# Patient Record
Sex: Female | Born: 1965 | Race: White | Hispanic: No | Marital: Married | Smoking: Current every day smoker
Health system: Southern US, Community
[De-identification: ages and names within clinical notes are randomized; demographics above are authoritative.]

## PROBLEM LIST (undated history)

## (undated) DIAGNOSIS — K219 Gastro-esophageal reflux disease without esophagitis: Secondary | ICD-10-CM

## (undated) DIAGNOSIS — Z720 Tobacco use: Secondary | ICD-10-CM

## (undated) DIAGNOSIS — R51 Headache: Secondary | ICD-10-CM

## (undated) DIAGNOSIS — F419 Anxiety disorder, unspecified: Secondary | ICD-10-CM

## (undated) DIAGNOSIS — D539 Nutritional anemia, unspecified: Secondary | ICD-10-CM

## (undated) DIAGNOSIS — G8929 Other chronic pain: Secondary | ICD-10-CM

## (undated) DIAGNOSIS — R519 Headache, unspecified: Secondary | ICD-10-CM

## (undated) DIAGNOSIS — E119 Type 2 diabetes mellitus without complications: Secondary | ICD-10-CM

## (undated) DIAGNOSIS — F329 Major depressive disorder, single episode, unspecified: Secondary | ICD-10-CM

## (undated) DIAGNOSIS — J449 Chronic obstructive pulmonary disease, unspecified: Secondary | ICD-10-CM

## (undated) DIAGNOSIS — Z9289 Personal history of other medical treatment: Secondary | ICD-10-CM

## (undated) DIAGNOSIS — Z973 Presence of spectacles and contact lenses: Secondary | ICD-10-CM

## (undated) DIAGNOSIS — Z9889 Other specified postprocedural states: Secondary | ICD-10-CM

## (undated) DIAGNOSIS — J40 Bronchitis, not specified as acute or chronic: Secondary | ICD-10-CM

## (undated) DIAGNOSIS — R112 Nausea with vomiting, unspecified: Secondary | ICD-10-CM

## (undated) DIAGNOSIS — H269 Unspecified cataract: Secondary | ICD-10-CM

## (undated) DIAGNOSIS — K089 Disorder of teeth and supporting structures, unspecified: Secondary | ICD-10-CM

## (undated) DIAGNOSIS — F32A Depression, unspecified: Secondary | ICD-10-CM

## (undated) DIAGNOSIS — N6092 Unspecified benign mammary dysplasia of left breast: Secondary | ICD-10-CM

## (undated) HISTORY — PX: TUBAL LIGATION: SHX77

## (undated) HISTORY — DX: Anxiety disorder, unspecified: F41.9

## (undated) HISTORY — DX: Headache, unspecified: R51.9

## (undated) HISTORY — DX: Other chronic pain: G89.29

## (undated) HISTORY — DX: Unspecified cataract: H26.9

## (undated) HISTORY — DX: Tobacco use: Z72.0

## (undated) HISTORY — PX: ABDOMINAL HYSTERECTOMY: SHX81

## (undated) HISTORY — DX: Nutritional anemia, unspecified: D53.9

## (undated) HISTORY — DX: Headache: R51

---
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
## 1986-12-24 HISTORY — PX: APPENDECTOMY: SHX54

## 1993-12-24 HISTORY — PX: KNEE ARTHROSCOPY: SUR90

## 1998-05-30 ENCOUNTER — Emergency Department (HOSPITAL_COMMUNITY): Admission: EM | Admit: 1998-05-30 | Discharge: 1998-05-30 | Payer: Self-pay | Admitting: Emergency Medicine

## 1998-07-04 ENCOUNTER — Encounter: Admission: RE | Admit: 1998-07-04 | Discharge: 1998-07-04 | Payer: Self-pay | Admitting: Family Medicine

## 1998-07-15 ENCOUNTER — Encounter: Admission: RE | Admit: 1998-07-15 | Discharge: 1998-07-15 | Payer: Self-pay | Admitting: Family Medicine

## 1998-08-03 ENCOUNTER — Encounter: Admission: RE | Admit: 1998-08-03 | Discharge: 1998-08-03 | Payer: Self-pay | Admitting: Family Medicine

## 1999-04-04 ENCOUNTER — Encounter: Admission: RE | Admit: 1999-04-04 | Discharge: 1999-04-04 | Payer: Self-pay | Admitting: Family Medicine

## 1999-04-11 ENCOUNTER — Encounter: Admission: RE | Admit: 1999-04-11 | Discharge: 1999-04-11 | Payer: Self-pay | Admitting: Sports Medicine

## 2000-03-31 ENCOUNTER — Encounter: Payer: Self-pay | Admitting: Emergency Medicine

## 2000-03-31 ENCOUNTER — Emergency Department (HOSPITAL_COMMUNITY): Admission: EM | Admit: 2000-03-31 | Discharge: 2000-03-31 | Payer: Self-pay | Admitting: Emergency Medicine

## 2001-10-24 ENCOUNTER — Emergency Department (HOSPITAL_COMMUNITY): Admission: EM | Admit: 2001-10-24 | Discharge: 2001-10-24 | Payer: Self-pay | Admitting: Emergency Medicine

## 2001-10-24 ENCOUNTER — Encounter: Payer: Self-pay | Admitting: Emergency Medicine

## 2001-10-26 ENCOUNTER — Emergency Department (HOSPITAL_COMMUNITY): Admission: EM | Admit: 2001-10-26 | Discharge: 2001-10-26 | Payer: Self-pay | Admitting: Emergency Medicine

## 2001-10-27 ENCOUNTER — Inpatient Hospital Stay (HOSPITAL_COMMUNITY): Admission: EM | Admit: 2001-10-27 | Discharge: 2001-10-31 | Payer: Self-pay | Admitting: Emergency Medicine

## 2001-10-29 ENCOUNTER — Encounter: Payer: Self-pay | Admitting: Internal Medicine

## 2001-11-17 ENCOUNTER — Ambulatory Visit (HOSPITAL_COMMUNITY): Admission: RE | Admit: 2001-11-17 | Discharge: 2001-11-17 | Payer: Self-pay | Admitting: *Deleted

## 2001-11-18 ENCOUNTER — Encounter: Admission: RE | Admit: 2001-11-18 | Discharge: 2001-11-18 | Payer: Self-pay | Admitting: Internal Medicine

## 2004-03-12 ENCOUNTER — Emergency Department (HOSPITAL_COMMUNITY): Admission: EM | Admit: 2004-03-12 | Discharge: 2004-03-12 | Payer: Self-pay | Admitting: Emergency Medicine

## 2004-09-24 ENCOUNTER — Emergency Department (HOSPITAL_COMMUNITY): Admission: EM | Admit: 2004-09-24 | Discharge: 2004-09-24 | Payer: Self-pay | Admitting: Emergency Medicine

## 2004-12-12 ENCOUNTER — Emergency Department (HOSPITAL_COMMUNITY): Admission: EM | Admit: 2004-12-12 | Discharge: 2004-12-12 | Payer: Self-pay | Admitting: Family Medicine

## 2005-07-15 ENCOUNTER — Emergency Department (HOSPITAL_COMMUNITY): Admission: EM | Admit: 2005-07-15 | Discharge: 2005-07-16 | Payer: Self-pay | Admitting: Emergency Medicine

## 2007-01-19 ENCOUNTER — Emergency Department (HOSPITAL_COMMUNITY): Admission: EM | Admit: 2007-01-19 | Discharge: 2007-01-20 | Payer: Self-pay | Admitting: Emergency Medicine

## 2009-03-10 ENCOUNTER — Emergency Department (HOSPITAL_COMMUNITY): Admission: EM | Admit: 2009-03-10 | Discharge: 2009-03-10 | Payer: Self-pay | Admitting: Emergency Medicine

## 2009-05-02 ENCOUNTER — Emergency Department (HOSPITAL_COMMUNITY): Admission: EM | Admit: 2009-05-02 | Discharge: 2009-05-02 | Payer: Self-pay | Admitting: Family Medicine

## 2009-09-30 ENCOUNTER — Ambulatory Visit: Payer: Self-pay | Admitting: Family Medicine

## 2009-09-30 DIAGNOSIS — G4700 Insomnia, unspecified: Secondary | ICD-10-CM | POA: Insufficient documentation

## 2009-09-30 DIAGNOSIS — F17200 Nicotine dependence, unspecified, uncomplicated: Secondary | ICD-10-CM | POA: Insufficient documentation

## 2009-09-30 DIAGNOSIS — R5383 Other fatigue: Secondary | ICD-10-CM | POA: Insufficient documentation

## 2009-09-30 DIAGNOSIS — F4323 Adjustment disorder with mixed anxiety and depressed mood: Secondary | ICD-10-CM | POA: Insufficient documentation

## 2009-09-30 DIAGNOSIS — R5381 Other malaise: Secondary | ICD-10-CM | POA: Insufficient documentation

## 2009-09-30 DIAGNOSIS — F411 Generalized anxiety disorder: Secondary | ICD-10-CM | POA: Insufficient documentation

## 2009-09-30 DIAGNOSIS — R030 Elevated blood-pressure reading, without diagnosis of hypertension: Secondary | ICD-10-CM | POA: Insufficient documentation

## 2009-10-03 ENCOUNTER — Telehealth: Payer: Self-pay | Admitting: Family Medicine

## 2009-10-04 LAB — CONVERTED CEMR LAB
Basophils Relative: 0.5 % (ref 0.0–3.0)
Calcium: 8.9 mg/dL (ref 8.4–10.5)
Chloride: 105 meq/L (ref 96–112)
Eosinophils Relative: 4 % (ref 0.0–5.0)
Glucose, Bld: 80 mg/dL (ref 70–99)
HDL: 35.6 mg/dL — ABNORMAL LOW (ref >39.00)
LDL Cholesterol: 89 mg/dL (ref 0–99)
Lymphs Abs: 2.3 10*3/uL (ref 0.7–4.0)
MCV: 96.8 fL (ref 78.0–100.0)
Monocytes Absolute: 0.7 10*3/uL (ref 0.1–1.0)
Monocytes Relative: 10 % (ref 3.0–12.0)
Neutro Abs: 3.2 10*3/uL (ref 1.4–7.7)
Neutrophils Relative %: 50.6 % (ref 43.0–77.0)
RDW: 12.4 % (ref 11.5–14.6)
TSH: 0.68 microintl units/mL (ref 0.35–5.50)
Total CHOL/HDL Ratio: 4
VLDL: 8.8 mg/dL (ref 0.0–40.0)

## 2009-10-28 ENCOUNTER — Ambulatory Visit: Payer: Self-pay | Admitting: Family Medicine

## 2009-10-28 ENCOUNTER — Other Ambulatory Visit: Admission: RE | Admit: 2009-10-28 | Discharge: 2009-10-28 | Payer: Self-pay | Admitting: Family Medicine

## 2009-10-28 ENCOUNTER — Encounter: Payer: Self-pay | Admitting: Family Medicine

## 2009-10-28 DIAGNOSIS — R072 Precordial pain: Secondary | ICD-10-CM | POA: Insufficient documentation

## 2009-10-28 LAB — HM PAP SMEAR

## 2009-10-31 ENCOUNTER — Encounter: Admission: RE | Admit: 2009-10-31 | Discharge: 2009-10-31 | Payer: Self-pay | Admitting: Family Medicine

## 2009-10-31 ENCOUNTER — Encounter (INDEPENDENT_AMBULATORY_CARE_PROVIDER_SITE_OTHER): Payer: Self-pay | Admitting: *Deleted

## 2009-11-02 DIAGNOSIS — N63 Unspecified lump in unspecified breast: Secondary | ICD-10-CM | POA: Insufficient documentation

## 2009-11-04 ENCOUNTER — Encounter: Payer: Self-pay | Admitting: Family Medicine

## 2009-11-09 ENCOUNTER — Encounter: Admission: RE | Admit: 2009-11-09 | Discharge: 2009-11-09 | Payer: Self-pay | Admitting: Family Medicine

## 2009-11-09 LAB — HM MAMMOGRAPHY

## 2009-11-15 ENCOUNTER — Ambulatory Visit: Payer: Self-pay

## 2009-11-15 ENCOUNTER — Ambulatory Visit: Payer: Self-pay | Admitting: Cardiovascular Disease

## 2009-11-30 ENCOUNTER — Ambulatory Visit: Payer: Self-pay | Admitting: Family Medicine

## 2009-12-18 ENCOUNTER — Telehealth: Payer: Self-pay | Admitting: Family Medicine

## 2009-12-24 DIAGNOSIS — Z9289 Personal history of other medical treatment: Secondary | ICD-10-CM

## 2009-12-24 HISTORY — DX: Personal history of other medical treatment: Z92.89

## 2009-12-29 ENCOUNTER — Ambulatory Visit: Payer: Self-pay | Admitting: Family Medicine

## 2009-12-29 DIAGNOSIS — N949 Unspecified condition associated with female genital organs and menstrual cycle: Secondary | ICD-10-CM

## 2009-12-29 DIAGNOSIS — N938 Other specified abnormal uterine and vaginal bleeding: Secondary | ICD-10-CM | POA: Insufficient documentation

## 2010-01-05 ENCOUNTER — Ambulatory Visit: Payer: Self-pay | Admitting: Obstetrics & Gynecology

## 2010-01-06 ENCOUNTER — Encounter: Payer: Self-pay | Admitting: Obstetrics & Gynecology

## 2010-01-06 LAB — CONVERTED CEMR LAB
Trich, Wet Prep: NONE SEEN
Yeast Wet Prep HPF POC: NONE SEEN

## 2010-01-10 ENCOUNTER — Ambulatory Visit (HOSPITAL_COMMUNITY): Admission: RE | Admit: 2010-01-10 | Discharge: 2010-01-10 | Payer: Self-pay | Admitting: Obstetrics & Gynecology

## 2010-01-24 ENCOUNTER — Ambulatory Visit: Payer: Self-pay | Admitting: Obstetrics & Gynecology

## 2010-02-21 ENCOUNTER — Telehealth: Payer: Self-pay | Admitting: Family Medicine

## 2010-03-04 ENCOUNTER — Ambulatory Visit: Payer: Self-pay | Admitting: Family Medicine

## 2010-03-06 ENCOUNTER — Telehealth: Payer: Self-pay | Admitting: Family Medicine

## 2010-03-23 ENCOUNTER — Ambulatory Visit: Payer: Self-pay | Admitting: Family Medicine

## 2010-04-04 ENCOUNTER — Telehealth: Payer: Self-pay | Admitting: Family Medicine

## 2010-05-02 ENCOUNTER — Telehealth: Payer: Self-pay | Admitting: Family Medicine

## 2010-05-30 ENCOUNTER — Ambulatory Visit: Payer: Self-pay | Admitting: Family Medicine

## 2010-05-30 DIAGNOSIS — G43909 Migraine, unspecified, not intractable, without status migrainosus: Secondary | ICD-10-CM | POA: Insufficient documentation

## 2010-06-02 ENCOUNTER — Telehealth: Payer: Self-pay | Admitting: Family Medicine

## 2010-06-02 DIAGNOSIS — R519 Headache, unspecified: Secondary | ICD-10-CM | POA: Insufficient documentation

## 2010-06-02 DIAGNOSIS — R51 Headache: Secondary | ICD-10-CM | POA: Insufficient documentation

## 2010-06-07 ENCOUNTER — Telehealth: Payer: Self-pay | Admitting: Family Medicine

## 2010-07-02 ENCOUNTER — Emergency Department (HOSPITAL_COMMUNITY): Admission: EM | Admit: 2010-07-02 | Discharge: 2010-07-02 | Payer: Self-pay | Admitting: Emergency Medicine

## 2010-07-03 ENCOUNTER — Telehealth: Payer: Self-pay | Admitting: Family Medicine

## 2010-08-07 ENCOUNTER — Telehealth: Payer: Self-pay | Admitting: Family Medicine

## 2010-09-12 ENCOUNTER — Ambulatory Visit: Payer: Self-pay | Admitting: Family Medicine

## 2010-09-12 DIAGNOSIS — J069 Acute upper respiratory infection, unspecified: Secondary | ICD-10-CM | POA: Insufficient documentation

## 2011-01-14 ENCOUNTER — Encounter: Payer: Self-pay | Admitting: Family Medicine

## 2011-01-23 NOTE — Assessment & Plan Note (Signed)
Summary: cold/bronchitis/dlo   Vital Signs:  Patient profile:   45 year old female Height:      62 inches Weight:      125 pounds BMI:     22.95 Temp:     98.4 degrees F oral Pulse rate:   72 / minute Pulse rhythm:   regular BP sitting:   120 / 80  (left arm) Cuff size:   regular  Vitals Entered By: Linde Gillis CMA Duncan Dull) (September 12, 2010 12:06 PM) CC: cold, ? bronchitis   History of Present Illness: 45 yo with 2 weeks of worsening URI symptoms. STarted out with runny nose, dry cough, ear pain. Now has sinus pressure and wheezing. Cough is slightly productive.  Terrible coughing spells, coughs so hard she vomits. Having body aches and subjective fevers. No SOB unless she is having coughing spell. No CP.  Current Medications (verified): 1)  Trazodone Hcl 100 Mg Tabs (Trazodone Hcl) .Marland Kitchen.. 1 Tab By Mouth At Bedtime. 2)  Junel Fe 1/20 1-20 Mg-Mcg Tabs (Norethin Ace-Eth Estrad-Fe) .... Take 1 Tablet By Mouth Once A Day 3)  Imitrex 100 Mg Tabs (Sumatriptan Succinate) .Marland Kitchen.. 1 By Mouth At The Onset of Migraine. May Repeat Dose in One Hour If Headache Not Resolved 4)  Promethazine Hcl 25 Mg  Tabs (Promethazine Hcl) .Marland Kitchen.. 1 Tab Every Six Hours As Needed For Nausea. 5)  Cheratussin Ac 100-10 Mg/37ml Syrp (Guaifenesin-Codeine) .... 5 Ml Two Times A Day As Needed Cough. 6)  Azithromycin 250 Mg  Tabs (Azithromycin) .... 2 By  Mouth Today and Then 1 Daily For 4 Days  Allergies: 1)  ! Penicillin  Review of Systems      See HPI General:  Complains of chills and fever. Resp:  Complains of cough, sputum productive, and wheezing.  Physical Exam  General:  alert, NAD, non toxic appearing. VSS Ears:  R ear normal and L ear normal.   Nose:  nares are injected and congested bilaterally  Mouth:  pharynx pink and moist, no erythema, and no exudates.  some clear post nasal drip Lungs:  CTA with diffusely distant bs scattered exp wheezes no crackles Heart:  Normal rate and regular rhythm.  S1 and S2 normal without gallop, murmur, click, rub or other extra sounds. Extremities:  No clubbing, cyanosis, edema, or deformity noted with normal full range of motion of all joints.   Psych:  normal affect, talkative and pleasant    Impression & Recommendations:  Problem # 1:  URI (ICD-465.9) Assessment New Given duration and progression of symptoms, will treat with Zpack. Cheratussin as needed cough.  See pt instructions for details. Her updated medication list for this problem includes:    Promethazine Hcl 25 Mg Tabs (Promethazine hcl) .Marland Kitchen... 1 tab every six hours as needed for nausea.    Cheratussin Ac 100-10 Mg/74ml Syrp (Guaifenesin-codeine) .Marland KitchenMarland KitchenMarland KitchenMarland Kitchen 5 ml two times a day as needed cough.  Complete Medication List: 1)  Trazodone Hcl 100 Mg Tabs (Trazodone hcl) .Marland Kitchen.. 1 tab by mouth at bedtime. 2)  Junel Fe 1/20 1-20 Mg-mcg Tabs (Norethin ace-eth estrad-fe) .... Take 1 tablet by mouth once a day 3)  Imitrex 100 Mg Tabs (Sumatriptan succinate) .Marland Kitchen.. 1 by mouth at the onset of migraine. may repeat dose in one hour if headache not resolved 4)  Promethazine Hcl 25 Mg Tabs (Promethazine hcl) .Marland Kitchen.. 1 tab every six hours as needed for nausea. 5)  Cheratussin Ac 100-10 Mg/92ml Syrp (Guaifenesin-codeine) .... 5 ml two times a day  as needed cough. 6)  Azithromycin 250 Mg Tabs (Azithromycin) .... 2 by  mouth today and then 1 daily for 4 days  Patient Instructions: 1)  Take antibiotic as directed.  Drink lots of fluids.  Treat sympotmatically with Mucinex, nasal saline irrigation, and Tylenol/Ibuprofen. Also try claritin D or zyrtec D over the counter- two times a day as needed ( have to sign for them at pharmacy). You can use warm compresses.  Cough suppressant at night. Call if not improving as expected in 5-7 days.  Prescriptions: TRAZODONE HCL 100 MG TABS (TRAZODONE HCL) 1 tab by mouth at bedtime.  #30 x 6   Entered and Authorized by:   Ruthe Mannan MD   Signed by:   Ruthe Mannan MD on 09/12/2010    Method used:   Electronically to        [facility] #[id number]* (retail)       [address]       [place], [place]  [address]       Ph: [phone]       Fax: [phone]   RxID:   [id number] AZITHROMYCIN 250 MG  TABS (AZITHROMYCIN) 2 by  mouth today and then 1 daily for 4 days  #6 x 0   Entered and Authorized by:   Ruthe Mannan MD   Signed by:   Ruthe Mannan MD on 09/12/2010   Method used:   Print then Give to Patient   RxID:   [id number] CHERATUSSIN AC 100-10 MG/5ML SYRP (GUAIFENESIN-CODEINE) 5 ml two times a day as needed cough.  #400 ml x 0   Entered and Authorized by:   Ruthe Mannan MD   Signed by:   Ruthe Mannan MD on 09/12/2010   Method used:   Print then Give to Patient   RxID:   [id number]   Current Allergies (reviewed today): ! PENICILLIN

## 2011-01-23 NOTE — Progress Notes (Signed)
Summary: refill request for trazadone  Phone Note Refill Request Message from:  Fax from Pharmacy  Refills Requested: Medication #1:  TRAZODONE HCL 100 MG TABS 1 tab by mouth at bedtime.   Last Refilled: 07/03/2010 Faxed request from [facility] [address], [phone].  Initial call taken by: Lowella Petties CMA,  August 07, 2010 3:01 PM    Prescriptions: TRAZODONE HCL 100 MG TABS (TRAZODONE HCL) 1 tab by mouth at bedtime.  #30 x 0   Entered and Authorized by:   Ruthe Mannan MD   Signed by:   Ruthe Mannan MD on 08/07/2010   Method used:   Electronically to        [facility] [phone]* (retail)       [address]       [place], [place]  [address]       Ph: [phone]       Fax: [phone]   RxID:   [id number]

## 2011-01-23 NOTE — Progress Notes (Signed)
Summary: Rx Trazodone  Phone Note Refill Request Call back at [phone] Message from:  [facility] on February 21, 2010 9:52 AM  Refills Requested: Medication #1:  TRAZODONE HCL 100 MG TABS 1 tab by mouth at bedtime.   Last Refilled: 12/29/2009 Received faxed refill request, please advise   Method Requested: Telephone to Pharmacy Initial call taken by: Linde Gillis CMA Duncan Dull),  February 21, 2010 9:52 AM    Prescriptions: TRAZODONE HCL 100 MG TABS (TRAZODONE HCL) 1 tab by mouth at bedtime.  #30 x 0   Entered and Authorized by:   Shaune Leeks MD   Signed by:   Shaune Leeks MD on 02/21/2010   Method used:   Electronically to        [facility] [phone]* (retail)       [address]       [place], [place]  [address]       Ph: [phone]       Fax: [phone]   RxID:   [id number]

## 2011-01-23 NOTE — Progress Notes (Signed)
Summary: refill request for trazadone  Phone Note Refill Request Call back at Home Phone [phone] Message from:  Patient  Refills Requested: Medication #1:  TRAZODONE HCL 100 MG TABS 1 tab by mouth at bedtime. Phoned request from pt. Please send to [facility] [address] and let pt know.  Initial call taken by: Lowella Petties CMA,  May 02, 2010 2:43 PM    Prescriptions: TRAZODONE HCL 100 MG TABS (TRAZODONE HCL) 1 tab by mouth at bedtime.  #30 x 0   Entered and Authorized by:   Ruthe Mannan MD   Signed by:   Ruthe Mannan MD on 05/02/2010   Method used:   Electronically to        [facility] [phone]* (retail)       [address]       [place], [place]  [address]       Ph: [phone]       Fax: [phone]   RxID:   [id number]

## 2011-01-23 NOTE — Letter (Signed)
Summary: Out of Work  [facility]  [address] [place], [place] [address]   Phone: [phone]  Fax: [phone]    September 12, 2010   Employee:  Rayetta Gabriel Earing    To Whom It May Concern:   Ms. Dolorez Jeffrey was seen in my office today.  If you need additional information, please feel free to contact our office.         Sincerely,    Ruthe Mannan MD

## 2011-01-23 NOTE — Assessment & Plan Note (Signed)
Summary: JANUARY FOLLOW UPR/BH   Vital Signs:  Patient profile:   45 year old female Height:      62 inches Weight:      115.50 pounds BMI:     21.20 Temp:     98.2 degrees F oral Pulse rate:   80 / minute Pulse rhythm:   regular BP sitting:   122 / 62  (left arm) Cuff size:   regular  Vitals Entered By: Delilah Shan CMA Duncan Dull) (December 29, 2009 9:14 AM) CC: January follow up   History of Present Illness: 45 yo presents for follow up .  1.  Insomnia- has had problems falling and staying asleep since her son died two years from Cerebral Palsy complications.    Has also tried not drinking caffeine in the afternoon and not watching TV in the bedroom which have helped a little bit.  We placed her on ambien two months  but she says it has not helped her to fall asleep, then we tried Trazadone 50 mg last month and changing her Wellbutrin to qam dosing only.  Sitll no improvement of symptoms.     2.  Depression- began with her son's death two years ago.  Symptoms include insomnia, being easily tearful and sometimes anxious. Denies any weight loss or weight gain.  No SI or HI ideations. .  We started Zyban last month for tobacco abuse, the increased Wellbutrin to 300 mg qam.  She feels symptoms are much, much improved.    3.  Tobacco abuse - Smoked 2 ppd x 30 years.  Smokes [facility].   Wants to quit since her grandson is moving in with them next month.  We started Zyban 2 months ago, she cut back to 2 cig/s a day, now she is back to 2ppd.   4.  Menorrhagia- heavy periods are worsening.  This past month, went through 22 tampons/day.  She would like further intervention.  Current Medications (verified): 1)  Wellbutrin Xl 300 Mg Xr24h-Tab (Bupropion Hcl) .Marland Kitchen.. 1 Tab By Mouth Daily. 2)  Promethazine Hcl 25 Mg Tabs (Promethazine Hcl) .Marland Kitchen.. 1-2 By Mouth Up To Three Times A Day As Needed Nausea/vomiting/migraine 3)  Trazodone Hcl 100 Mg Tabs (Trazodone Hcl) .Marland Kitchen.. 1 Tab By Mouth At Bedtime. 4)   Chantix Starting Month Pak 0.5 Mg X 11 & 1 Mg X 42  Misc (Varenicline Tartrate) .... 0.5mg  By Mouth Once Daily For 3 Days, Then Twice Daily For 4 Days and Then 1mg  By Mouth 2 Times Daily  Allergies: 1)  ! Penicillin  Review of Systems      See HPI General:  Denies weakness. Resp:  Denies cough. GI:  Denies abdominal pain and bloody stools. GU:  Complains of abnormal vaginal bleeding. Psych:  Denies anxiety and depression.  Physical Exam  General:  underweight appearing, alert, pleasant.   Lungs:  normal respiratory effort, occasional exp wheezes.  normal respiratory effort and no crackles.   Heart:  Normal rate and regular rhythm. S1 and S2 normal without gallop, murmur, click, rub or other extra sounds. Abdomen:  Bowel sounds positive,abdomen soft and non-tender without masses, organomegaly or hernias noted. Psych:  normally interactive, good eye contact, not anxious appearing, and not depressed appearing.     Impression & Recommendations:  Problem # 1:  TOBACCO ABUSE (ICD-305.1) Assessment Deteriorated Time spent with patient 25 minutes, more than 50% of this time was spent counseling patient on smoking cessation. Will try Chantix.  Given handout and  booklet on Chantix.    The following medications were removed from the medication list:    Zyban 150 Mg Xr12h-tab (Bupropion hcl (smoking deter)) .Marland KitchenMarland KitchenMarland KitchenMarland Kitchen 150 mg once daily for 3 days; increase to 150 mg twice daily Her updated medication list for this problem includes:    Chantix Starting Month Pak 0.5 Mg X 11 & 1 Mg X 42 Misc (Varenicline tartrate) .Marland Kitchen... 0.5mg  by mouth once daily for 3 days, then twice daily for 4 days and then 1mg  by mouth 2 times daily  Problem # 2:  MENORRHAGIA, PERIMENOPAUSAL (ICD-626.8) Assessment: Deteriorated Will refer to gyn for further work up and treatment. Orders: Gynecologic Referral (Gyn)  Problem # 3:  INSOMNIA, CHRONIC (ICD-307.42) Assessment: Unchanged Increase Trazadone to 100 mg at  bedtime.  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  Complete Medication List: 1)  Wellbutrin Xl 300 Mg Xr24h-tab (Bupropion hcl) .Marland Kitchen.. 1 tab by mouth daily. 2)  Promethazine Hcl 25 Mg Tabs (Promethazine hcl) .Marland Kitchen.. 1-2 by mouth up to three times a day as needed nausea/vomiting/migraine 3)  Trazodone Hcl 100 Mg Tabs (Trazodone hcl) .Marland Kitchen.. 1 tab by mouth at bedtime. 4)  Chantix Starting Month Pak 0.5 Mg X 11 & 1 Mg X 42 Misc (Varenicline tartrate) .... 0.5mg  by mouth once daily for 3 days, then twice daily for 4 days and then 1mg  by mouth 2 times daily  Patient Instructions: 1)  Good to see you, Ms. Miceli. 2)  Please stop by to see Shirlee Limerick on your way out to set up your Gynecology referral. Prescriptions: CHANTIX STARTING MONTH PAK 0.5 MG X 11 & 1 MG X 42  MISC (VARENICLINE TARTRATE) 0.5mg  by mouth once daily for 3 days, then twice daily for 4 days and then 1mg  by mouth 2 times daily  #1 pack x 0   Entered and Authorized by:   Ruthe Mannan MD   Signed by:   Ruthe Mannan MD on 12/29/2009   Method used:   Electronically to        [facility] [phone]* (retail)       [address]       [place], [place]  [address]       Ph: [phone]       Fax: [phone]   RxID:   [id number] TRAZODONE HCL 100 MG TABS (TRAZODONE HCL) 1 tab by mouth at bedtime.  #30 x 0   Entered and Authorized by:   Ruthe Mannan MD   Signed by:   Ruthe Mannan MD on 12/29/2009   Method used:   Electronically to        [facility] [phone]* (retail)       [address]       [place], [place]  [address]       Ph: [phone]       Fax: [phone]   RxID:   [id number]   Current Allergies (reviewed today): ! PENICILLIN

## 2011-01-23 NOTE — Assessment & Plan Note (Signed)
Summary: HA X 2 WKS/CLE   Vital Signs:  Patient profile:   45 year old female Height:      62 inches Weight:      116.0 pounds BMI:     21.29 Temp:     99.2 degrees F oral Pulse rate:   80 / minute Pulse rhythm:   regular BP sitting:   140 / 70  (left arm) Cuff size:   regular  Vitals Entered By: Benny Lennert CMA Duncan Dull) (May 30, 2010 7:58 AM) CC: headache times 2 weeks Is Patient Diabetic? No   History of Present Illness: 45 yo with chronic migraines here for migraine that won't go away.  Started two weeks ago, typical symptoms for her sharp unilateral headache, associated with nausea, vomiting photophobia. Took Vicodin and phenergan that she had at home. Ran out of immitrex. No relief of symptoms.  Then about a week ago, pain moved to top of head, still associated with the other symptoms. No other focal neurological deficits.  Current Medications (verified): 1)  Trazodone Hcl 100 Mg Tabs (Trazodone Hcl) .Marland Kitchen.. 1 Tab By Mouth At Bedtime. 2)  Junel Fe 1/20 1-20 Mg-Mcg Tabs (Norethin Ace-Eth Estrad-Fe) .... Take 1 Tablet By Mouth Once A Day 3)  Imitrex 100 Mg Tabs (Sumatriptan Succinate) .Marland Kitchen.. 1 By Mouth At The Onset of Migraine. May Repeat Dose in One Hour If Headache Not Resolved 4)  Promethazine Hcl 25 Mg  Tabs (Promethazine Hcl) .Marland Kitchen.. 1 Tab Every Six Hours As Needed For Nausea.  Allergies: 1)  ! Penicillin  Review of Systems      See HPI General:  Denies fever. Eyes:  Complains of light sensitivity; denies blurring. GI:  Complains of nausea and vomiting. Neuro:  Complains of headaches; denies brief paralysis, numbness, poor balance, seizures, sensation of room spinning, tingling, tremors, visual disturbances, and weakness.  Physical Exam  General:  underweight appearing, alert, obviously uncomfortable with overhead light on. Neurologic:  alert & oriented X3, strength normal in all extremities, and gait normal.   Psych:  normal affect, talkative and pleasant     Impression & Recommendations:  Problem # 1:  MIGRAINE HEADACHE (ICD-346.90) Assessment Deteriorated intractable. Given IM toradol and phenergan. Pt felt better after recieiving injections. Refilled her IMitrex, advised to call in 1-2 days with an update. Her updated medication list for this problem includes:    Imitrex 100 Mg Tabs (Sumatriptan succinate) .Marland Kitchen... 1 by mouth at the onset of migraine. may repeat dose in one hour if headache not resolved  Complete Medication List: 1)  Trazodone Hcl 100 Mg Tabs (Trazodone hcl) .Marland Kitchen.. 1 tab by mouth at bedtime. 2)  Junel Fe 1/20 1-20 Mg-mcg Tabs (Norethin ace-eth estrad-fe) .... Take 1 tablet by mouth once a day 3)  Imitrex 100 Mg Tabs (Sumatriptan succinate) .Marland Kitchen.. 1 by mouth at the onset of migraine. may repeat dose in one hour if headache not resolved 4)  Promethazine Hcl 25 Mg Tabs (Promethazine hcl) .Marland Kitchen.. 1 tab every six hours as needed for nausea. Prescriptions: TRAZODONE HCL 100 MG TABS (TRAZODONE HCL) 1 tab by mouth at bedtime.  #30 x 0   Entered and Authorized by:   Ruthe Mannan MD   Signed by:   Ruthe Mannan MD on 05/30/2010   Method used:   Electronically to        [facility] [phone]* (retail)       [address]       [place], [place]  [address]  Ph: [phone]       Fax: [phone]   RxID:   [id number] PROMETHAZINE HCL 25 MG  TABS (PROMETHAZINE HCL) 1 tab every six hours as needed for nausea.  #30 x 1   Entered and Authorized by:   Ruthe Mannan MD   Signed by:   Ruthe Mannan MD on 05/30/2010   Method used:   Electronically to        [facility] [phone]* (retail)       [address]       [place], [place]  [address]       Ph: [phone]       Fax: [phone]   RxID:   [id number] IMITREX 100 MG TABS (SUMATRIPTAN SUCCINATE) 1 by mouth at the onset of migraine. May repeat dose in one hour if headache not resolved  #10 x 3   Entered and Authorized by:   Ruthe Mannan MD   Signed by:   Ruthe Mannan MD on  05/30/2010   Method used:   Electronically to        [facility] [phone]* (retail)       [address]       [place], [place]  [address]       Ph: [phone]       Fax: [phone]   RxID:   [id number]   Current Allergies (reviewed today): ! PENICILLIN   Medication Administration  Injection # 1:    Medication: Ketorolac-Toradol 15mg     Mfr: Novaplus  Orders Added: 1)  Est. Patient Level IV [71062]  Appended Document: HA X 2 WKS/CLE     Allergies: 1)  ! Penicillin   Complete Medication List: 1)  Trazodone Hcl 100 Mg Tabs (Trazodone hcl) .Marland Kitchen.. 1 tab by mouth at bedtime. 2)  Junel Fe 1/20 1-20 Mg-mcg Tabs (Norethin ace-eth estrad-fe) .... Take 1 tablet by mouth once a day 3)  Imitrex 100 Mg Tabs (Sumatriptan succinate) .Marland Kitchen.. 1 by mouth at the onset of migraine. may repeat dose in one hour if headache not resolved 4)  Promethazine Hcl 25 Mg Tabs (Promethazine hcl) .Marland Kitchen.. 1 tab every six hours as needed for nausea.  Other Orders: Ketorolac-Toradol 15mg  [phone]) Promethazine up to 50mg  (J2550) Admin of Therapeutic Inj  intramuscular or subcutaneous (46270)    Medication Administration  Injection # 1:    Medication: Ketorolac-Toradol 15mg     Diagnosis: MIGRAINE HEADACHE (ICD-346.90)    Route: IM    Site: RUOQ gluteus    Exp Date: 04/24/2011    Lot #: [id number]    Mfr: Novaplus    Comments: 60MG  GIVEN    Patient tolerated injection without complications    Given by: Linde Gillis CMA Duncan Dull) (May 30, 2010 8:48 AM)  Injection # 2:    Medication: Promethazine up to 50mg     Diagnosis: MIGRAINE HEADACHE (ICD-346.90)    Route: IM    Site: RUOQ gluteus    Exp Date: 11/24/2011    Lot #: [id number]    Mfr: Novaplus    Comments: 25MG  Given    Patient tolerated injection without complications    Given by: Linde Gillis CMA Duncan Dull) (May 30, 2010 8:49 AM)  Orders Added: 1)  Ketorolac-Toradol 15mg  [J1885] 2)  Promethazine up to 50mg  [J2550] 3)  Admin of  Therapeutic Inj  intramuscular or subcutaneous [71696]

## 2011-01-23 NOTE — Progress Notes (Signed)
Summary: Refill request Trazodone HCL 100 MG  Phone Note Refill Request Message from:  Patient on July 03, 2010 9:46 AM  Refills Requested: Medication #1:  TRAZODONE HCL 100 MG TABS 1 tab by mouth at bedtime. Pt called request refill on Trazodone HCL 100mg . Pt was to know if she can have refills on this medications.  [facility] [address]-call back # [phone]   Method Requested: Telephone to Pharmacy Initial call taken by: Daine Gip,  July 03, 2010 9:46 AM    Prescriptions: TRAZODONE HCL 100 MG TABS (TRAZODONE HCL) 1 tab by mouth at bedtime.  #30 x 0   Entered and Authorized by:   Ruthe Mannan MD   Signed by:   Ruthe Mannan MD on 07/03/2010   Method used:   Electronically to        [facility] [phone]* (retail)       [address]       [place], [place]  [address]       Ph: [phone]       Fax: [phone]   RxID:   [id number]   Appended Document: Refill request Trazodone HCL 100 MG Called pt, left message. Medication sent to pharmacy...cdavis 07-03-2010

## 2011-01-23 NOTE — Progress Notes (Signed)
Summary: headache  Phone Note Call from Patient Call back at Home Phone [phone]   Caller: Patient Call For: Ashley Mannan MD Summary of Call: Patient says that her headache is not any better. She wants to know what the next step would be. Please advise.  Initial call taken by: Melody Comas,  June 02, 2010 9:23 AM  Follow-up for Phone Call        I will place order for head CT, please let her know. Ashley Mannan MD  June 02, 2010 9:35 AM  Patient advised as instructed via telephone.  She is ok with the head CT being scheduled.  Linde Gillis CMA Duncan Dull)  June 02, 2010 10:38 AM   New Problems: HEADACHE (ICD-784.0)   New Problems: HEADACHE (ICD-784.0)

## 2011-01-23 NOTE — Progress Notes (Signed)
  Phone Note Call from Patient   Caller: Patient Summary of Call: Called the patient to verify that she had [employer] B/C when I went to do the Authorization it said she didnt have insurance. Apparently there is a problem with her bank not drafting acct so her premiums have not been paid. She says to cancel the CT Scan of her head right now as she cant afford the CT without insurance. She said she will call us back when it gets reinstated and she can afford to do the CT. I have called [facility] and cancelled the test. Initial call taken by: Carlton Adam,  June 07, 2010 9:08 AM

## 2011-01-23 NOTE — Assessment & Plan Note (Signed)
Summary: FOLLOW UP FOR BRONCHITIS   Vital Signs:  Patient profile:   45 year old female Height:      62 inches Weight:      119.38 pounds BMI:     21.91 Temp:     98.5 degrees F oral Pulse rate:   80 / minute Pulse rhythm:   regular BP sitting:   124 / 80  (left arm) Cuff size:   regular  Vitals Entered By: Delilah Shan CMA Duncan Dull) (March 23, 2010 2:53 PM) CC: ? bronchitis   History of Present Illness: 45 yo here for follow up bronchitis. Was seen on 3/12, and treated for bronchitis with Zpack. Felt better for a few days after finishing the Zpack- fever resolved, cough improved.  Several days ago, symptoms started to worsen again. Subjective fever, now cough is productive. No SOB or CP.    smoker 1/2ppd - is trying to quit    Current Medications (verified): 1)  Promethazine Hcl 25 Mg Tabs (Promethazine Hcl) .Marland Kitchen.. 1-2 By Mouth Up To Three Times A Day As Needed Nausea/vomiting/migraine 2)  Trazodone Hcl 100 Mg Tabs (Trazodone Hcl) .Marland Kitchen.. 1 Tab By Mouth At Bedtime. 3)  Junel Fe 1/20 1-20 Mg-Mcg Tabs (Norethin Ace-Eth Estrad-Fe) .... Take 1 Tablet By Mouth Once A Day 4)  Avelox 400 Mg  Tabs (Moxifloxacin Hcl) .Marland Kitchen.. 1 Tablet By Mouth Daily X 7 Days  Allergies: 1)  ! Penicillin  Review of Systems      See HPI General:  Complains of fever; denies chills. ENT:  Denies earache. CV:  Denies chest pain or discomfort. Resp:  Complains of cough, sputum productive, and wheezing; denies shortness of breath.  Physical Exam  General:  underweight appearing, alert, pleasant.   Ears:  R ear normal and L ear normal.   Mouth:  pharynx pink and moist, no erythema, and no exudates.  some clear post nasal drip Lungs:  CTA with diffusely distant bs scattered exp wheezes no crackles Heart:  Normal rate and regular rhythm. S1 and S2 normal without gallop, murmur, click, rub or other extra sounds. Psych:  normal affect, talkative and pleasant    Impression & Recommendations:  Problem #  1:  BRONCHITIS- ACUTE (ICD-466.0) Assessment New Given PMH and recent abx treatment, will broaden coverage with Avelox. Advised to call on Monday, if no improvement, consider prednisone. Red flags given requiring immediate follow up. The following medications were removed from the medication list:    Zithromax Z-pak 250 Mg Tabs (Azithromycin) .Marland Kitchen... Take by mouth as directed    Guaifenesin-codeine 100-10 Mg/34ml Syrp (Guaifenesin-codeine) .Marland Kitchen... 1-2 teaspoons by mouth up to every 4-6 hours as needed cough  caution- may sedate Her updated medication list for this problem includes:    Avelox 400 Mg Tabs (Moxifloxacin hcl) .Marland Kitchen... 1 tablet by mouth daily x 7 days  Complete Medication List: 1)  Promethazine Hcl 25 Mg Tabs (Promethazine hcl) .Marland Kitchen.. 1-2 by mouth up to three times a day as needed nausea/vomiting/migraine 2)  Trazodone Hcl 100 Mg Tabs (Trazodone hcl) .Marland Kitchen.. 1 tab by mouth at bedtime. 3)  Junel Fe 1/20 1-20 Mg-mcg Tabs (Norethin ace-eth estrad-fe) .... Take 1 tablet by mouth once a day 4)  Avelox 400 Mg Tabs (Moxifloxacin hcl) .Marland Kitchen.. 1 tablet by mouth daily x 7 days Prescriptions: TRAZODONE HCL 100 MG TABS (TRAZODONE HCL) 1 tab by mouth at bedtime.  #30 x 0   Entered and Authorized by:   Ruthe Mannan MD   Signed by:  Ruthe Mannan MD on 03/23/2010   Method used:   Electronically to        [id number]* (retail)       [address]       [place], [place]  [address]       Ph: [phone]       Fax: [phone]   RxID:   [id number] AVELOX 400 MG  TABS (MOXIFLOXACIN HCL) 1 tablet by mouth daily x 7 days  #7 x 0   Entered and Authorized by:   Ruthe Mannan MD   Signed by:   Ruthe Mannan MD on 03/23/2010   Method used:   Electronically to        [facility] [phone]* (retail)       [address]       [place], [place]  [address]       Ph: [phone]       Fax: [phone]   RxID:   [id number]   Current Allergies (reviewed today): ! PENICILLIN

## 2011-01-23 NOTE — Progress Notes (Signed)
Summary: not much better  Phone Note Call from Patient Call back at Home Phone [phone]   Caller: Patient Call For: Ashley Mannan MD Summary of Call: Pt was seen for bronchitis and finished her abx last week.  She is not any better- still has tightness in her chest, dry deep cough, hoarse.  No fever, no drainage, no shortness of breath.  I did advise her that cough can linger for awhile.  Asks if she should have more abx, uses [facility] [address]. Initial call taken by: Lowella Petties CMA,  April 04, 2010 12:28 PM  Follow-up for Phone Call        Avelox is one of the strongest abx we have . I would not advise more.  If she wants to be seen, we can fit her in this week. Follow-up by: Ashley Mannan MD,  April 04, 2010 12:33 PM  Additional Follow-up for Phone Call Additional follow up Details #1::        Patient Advised.   Appointment scheduled . Additional Follow-up by: Delilah Shan CMA Duncan Dull),  April 04, 2010 12:59 PM

## 2011-01-23 NOTE — Progress Notes (Signed)
Summary: call a nurse  Phone Note Call from Patient   Caller: Patient Call For: Ruthe Mannan MD Summary of Call: Triage Record Num: [id number] Operator: Yvette Rack Patient Name: Ashley [name] Call Date & Time: 03/04/2010 11:12:06AM Patient Phone: [phone] PCP: Ruthe Mannan Patient Gender: Female PCP Fax : [phone] Patient DOB: [birth year] Practice Name: [facility] Reason for Call: Pt calling @ cold symptoms/temp 100.2 x 2 days. Pt reports a sore throat and ear pain. Pt reports a dry cough. No emergent s/sx noted. RN informed pt she needs to be seen within the next 3-4 hours and appt scheduled for Sat 3/12 at 12:15pm today in [place] office. Protocol(s) Used: Sore Throat / Hoarseness Recommended Outcome per Protocol: See Provider within 4 hours Reason for Outcome: Marked difficulty swallowing due to sore throat unresponsive to 12 hours of home care Care Advice:  ~ 03/ Initial call taken by: Melody Comas,  March 06, 2010 9:58 AM  Follow-up for Phone Call        Seen in weekend clinic, see note. Follow-up by: Ruthe Mannan MD,  March 06, 2010 10:02 AM

## 2011-01-23 NOTE — Assessment & Plan Note (Signed)
Summary: SORE THROAT,FEVER...AS.   Vital Signs:  Patient profile:   45 year old female Weight:      117 pounds Temp:     99.4 degrees F oral BP sitting:   122 / 72  (left arm)  Vitals Entered By: Doristine Devoid (March 04, 2010 12:09 PM) CC: L ear pain and throat sore on L side along w/ fever    History of Present Illness: started getting sick thurs night -chills with 101 temp (low grade since then) bad cough that hurts deep in chest  is having ear and throat pain on the L side  hurts to swallow  non prod cough - can hear a bit of rattle   smoker 1/2ppd - is trying to quit  no wheeze   no n/v/d   is taking some nyquil otc -- ? if helps much   Allergies: 1)  ! Penicillin  Past History:  Past Medical History: Last updated: 09/30/2009 Anxiety Tobacco abuse  Past Surgical History: Last updated: 09/30/2009 Appendectomy 1988  Family History: Last updated: 09/30/2009 Mom- A, 63, DM, HTN, HLD Dad, A, 64- healthy G6- 5 living children, one died of cerebral palsy  Social History: Last updated: 09/30/2009 Lives with husband.  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  5 living children.  One child is a crack cocaine addict who often breaks into their house to steal money. Current Smoker Alcohol use-no  Risk Factors: Smoking Status: current (09/30/2009) Packs/Day: 2.0 (09/30/2009)  Review of Systems General:  Complains of chills, fatigue, fever, loss of appetite, and malaise. Eyes:  Complains of eye irritation; denies blurring. ENT:  Complains of earache, nasal congestion, postnasal drainage, sinus pressure, and sore throat. CV:  Denies chest pain or discomfort and palpitations. Resp:  Complains of cough and sputum productive; denies pleuritic and shortness of breath. GI:  Denies diarrhea, nausea, and vomiting. Derm:  Denies rash.  Physical Exam  General:  underweight appearing, alert, pleasant.   Head:  normocephalic, atraumatic, and no  abnormalities observed.  mild maxillary sinus tenderness  Eyes:  vision grossly intact, pupils equal, pupils round, pupils reactive to light, and no injection.   Ears:  R ear normal and L ear normal.   Nose:  nares are injected and congested bilaterally  Mouth:  pharynx pink and moist, no erythema, and no exudates.  some clear post nasal drip Neck:  No deformities, masses, or tenderness noted. Lungs:  CTA with diffusely distant bs harsh bs and occ rhonchi at bases  no rales or rhonchi  no prolonged exp phase Heart:  Normal rate and regular rhythm. S1 and S2 normal without gallop, murmur, click, rub or other extra sounds. Skin:  Intact without suspicious lesions or rashes Cervical Nodes:  No lymphadenopathy noted Psych:  normal affect, talkative and pleasant    Impression & Recommendations:  Problem # 1:  BRONCHITIS- ACUTE (ICD-466.0) Assessment New with uri in smoker with cough and some facial pain  recommend sympt care- see pt instructions   cover with zpack and guifen- cod with caution for cough for sedation adv to keep working on quitting smoking pt advised to update me if symptoms worsen or do not improve - esp if wheeze or sob  Her updated medication list for this problem includes:    Zithromax Z-pak 250 Mg Tabs (Azithromycin) .Marland Kitchen... Take by mouth as directed    Guaifenesin-codeine 100-10 Mg/86ml Syrp (Guaifenesin-codeine) .Marland Kitchen... 1-2 teaspoons by mouth up to every 4-6 hours as needed cough  caution- may sedate  Complete Medication List: 1)  Wellbutrin Xl 300 Mg Xr24h-tab (Bupropion hcl) .Marland Kitchen.. 1 tab by mouth daily. 2)  Promethazine Hcl 25 Mg Tabs (Promethazine hcl) .Marland Kitchen.. 1-2 by mouth up to three times a day as needed nausea/vomiting/migraine 3)  Trazodone Hcl 100 Mg Tabs (Trazodone hcl) .Marland Kitchen.. 1 tab by mouth at bedtime. 4)  Chantix Starting Month Pak 0.5 Mg X 11 & 1 Mg X 42 Misc (Varenicline tartrate) .... 0.5mg  by mouth once daily for 3 days, then twice daily for 4 days and then 1mg  by  mouth 2 times daily 5)  Zithromax Z-pak 250 Mg Tabs (Azithromycin) .... Take by mouth as directed 6)  Guaifenesin-codeine 100-10 Mg/67ml Syrp (Guaifenesin-codeine) .Marland Kitchen.. 1-2 teaspoons by mouth up to every 4-6 hours as needed cough  caution- may sedate  Patient Instructions: 1)  take the zithromax as directed 2)  keep working on quitting smoking  3)  use cough med with caution -- it can sedate 4)  get lots of fluids and rest  5)  if worse or wheezing -update me  6)  follow up next week if not improved  7)  I sent zpk to pharmacy but here is paper px for cough med  Prescriptions: GUAIFENESIN-CODEINE 100-10 MG/5ML SYRP (GUAIFENESIN-CODEINE) 1-2 teaspoons by mouth up to every 4-6 hours as needed cough  caution- may sedate  #120cc x 0   Entered and Authorized by:   Judith Part MD   Signed by:   Judith Part MD on 03/04/2010   Method used:   Print then Give to Patient   RxID:   [id number] ZITHROMAX Z-PAK 250 MG TABS (AZITHROMYCIN) take by mouth as directed  #1 pack x 0   Entered and Authorized by:   Judith Part MD   Signed by:   Judith Part MD on 03/04/2010   Method used:   Electronically to        [facility] [phone]* (retail)       [address]       [place], [place]  [address]       Ph: [phone]       Fax: [phone]   RxID:   [id number]

## 2011-03-11 LAB — CBC
HCT: 36 % (ref 36.0–46.0)
Platelets: 361 10*3/uL (ref 150–400)
RDW: 13.1 % (ref 11.5–15.5)
WBC: 10.7 10*3/uL — ABNORMAL HIGH (ref 4.0–10.5)

## 2011-03-11 LAB — POCT CARDIAC MARKERS
CKMB, poc: <1 ng/mL — ABNORMAL LOW (ref 1.0–8.0)
CKMB, poc: <1 ng/mL — ABNORMAL LOW (ref 1.0–8.0)
Myoglobin, poc: 49.7 ng/mL (ref 12–200)
Troponin i, poc: <0.05 ng/mL (ref 0.00–0.09)

## 2011-03-11 LAB — DIFFERENTIAL
Basophils Absolute: 0.1 10*3/uL (ref 0.0–0.1)
Basophils Relative: 1 % (ref 0–1)
Eosinophils Absolute: 0.2 10*3/uL (ref 0.0–0.7)
Eosinophils Relative: 2 % (ref 0–5)
Lymphs Abs: 3 10*3/uL (ref 0.7–4.0)

## 2011-03-11 LAB — BASIC METABOLIC PANEL
Calcium: 8.6 mg/dL (ref 8.4–10.5)
Glucose, Bld: 89 mg/dL (ref 70–99)
Potassium: 3.5 mEq/L (ref 3.5–5.1)

## 2011-04-03 LAB — POCT URINALYSIS DIP (DEVICE)
Glucose, UA: NEGATIVE mg/dL
Ketones, ur: NEGATIVE mg/dL
Specific Gravity, Urine: 1.01 (ref 1.005–1.030)
Urobilinogen, UA: 0.2 mg/dL (ref 0.0–1.0)

## 2011-04-03 LAB — URINE CULTURE

## 2011-04-05 LAB — POCT CARDIAC MARKERS
CKMB, poc: <1 ng/mL — ABNORMAL LOW (ref 1.0–8.0)
Myoglobin, poc: 37.2 ng/mL (ref 12–200)

## 2011-05-08 NOTE — Assessment & Plan Note (Signed)
NAMEAMICA, Ashley [name]                   ACCOUNT NO.:  [id number]   MEDICAL RECORD NO.:  [id number]          PATIENT TYPE:  POB   LOCATION:  [facility] at [facility]         FACILITY:  [facility]   PHYSICIAN:  Scheryl Darter, MD       DATE OF BIRTH:  1966-09-24   DATE OF SERVICE:                                  CLINIC NOTE   The patient comes today.  She complained of heavy periods and cramps.  The patient is a 45 year old white female gravida 3, para 3, last  menstrual period December 20, 2009.  She says that over the last year  and half her periods have become increasingly heavy and painful, now  last 7 days with heavy flow throughout including clots.  She no longer  responds to ibuprofen and she has been using Vicodin which she and her  husband has collected from various sources.  She says this does not help  very well now.  The patient is status post tubal ligation.   PAST MEDICAL HISTORY:  Pneumonia and jaundice.   SOCIAL HISTORY:  The patient is married and recently quit smoking.   MEDICATIONS:  1. Wellbutrin XL 300 mg 1 daily.  2. Trazodone HCl 100 mg p.o. nightly.   ALLERGIES:  She has allergies to PENICILLIN.  No latex allergy.   FAMILY HISTORY:  Lung cancer in a grandmother.  Coronary heart disease  in a grandmother on her father side.  Diabetes in her mother and her  sister and hypertension.   PAST SURGICAL HISTORY:  Laparoscopic bilateral tubal ligation in 1990  and appendectomy in 1987.   PHYSICAL EXAMINATION:  GENERAL:  The patient in no acute distress.  The  patient has normal affect.  VITAL SIGNS:  Weight is 132 pounds, height 5 feet 2 inches, BP 118/67,  pulse 61.  HEENT:  She has poor dentition with all of her upper front teeth decayed  down to stumps.  ABDOMEN:  Flat, soft, nontender.  PELVIC:  External genitalia.  Vagina and cervix appeared normal, slight  whitish discharge.  Gonorrhea and Chlamydia probe as well as wet prep  was obtained.  She has minimal cervical  motion tenderness.  Uterus felt  4 to 6 weeks' size, nontender.  No adnexal masses or tenderness.   IMPRESSION:  Menorrhagia and dysmenorrhea in a patient who was  sterilized over 20 years ago.   PLAN:  Schedule a pelvic ultrasound.  She will return after this is  completed.  At that time, we will discuss options for management which  might include endometrial ablation.      Scheryl Darter, MD     JA/MEDQ  D:  01/05/2010  T:  01/05/2010  Job:  [id number]

## 2011-05-08 NOTE — Assessment & Plan Note (Signed)
Ashley [name], Ashley [name]                   ACCOUNT NO.:  [id number]   MEDICAL RECORD NO.:  [id number]          PATIENT TYPE:  POB   LOCATION:  [facility]         FACILITY:  [facility]   PHYSICIAN:  Scheryl Darter, MD       DATE OF BIRTH:  [birth year]   DATE OF SERVICE:                                  CLINIC NOTE   The patient returns today to follow up from her appointment on January 05, 2010, due to heavy periods and cramps.  Her last period started  January 17, 2010, and lasts for a week and was worse than usual.  She  takes Vicodin for pain which helps some.  Ultrasound showed normal-  appearing ovaries, endometrium was tigroid appearance consistent with  her cycle phase with 1.1 cm.  There was a small area of focally altered  echo texture 0.8 x 0.9 cm consistent with a small intramural fibroid.  The patient has normal ultrasound.  Discussed these results.  Discussed  options for management of her menstrual problems including hysterectomy,  endometrial ablation, Mirena IUD placement, or oral contraceptive pills.  She likes to try oral contraceptives.  She took these in years past.  She has quit smoking now.  She will consider her other options.  She  will return in 2 months.  I gave her a prescription for Junel 21, 1/20  one p.o. daily.  She will call if she needs to come back to the clinic  sooner.      Scheryl Darter, MD     JA/MEDQ  D:  01/24/2010  T:  01/25/2010  Job:  [id number]

## 2011-05-11 NOTE — Discharge Summary (Signed)
[place]  Patient:    Ashley [name], Ashley [name] Visit Number: [id number] MRN: [id number]          Service Type: MED Location: [phone] Attending Physician:  Edwyna Perfect Dictated by:   Lendell Caprice, M.D. Admit Date:  10/27/2001 Discharge Date: 10/31/2001                             Discharge Summary  DISCHARGE DIAGNOSES: 1. Lobar ______ acute pyelonephritis. 2. Tobacco abuse. 3. Acute urinary tract infection. 4. Anemia.  DISCHARGE MEDICATIONS: 1. Tequin 400 mg p.o. q.d. x 25 days. 2. Wellbutrin 150 mg p.o. b.i.d. 3. Motrin 800 mg p.o. t.i.d. p.r.n. pain.  FOLLOW-UP:  Ms. Coventry should report to [facility] outpatient clinic on Tuesday, November 26, at 2:30 p.m. for a hospital follow-up visit with Dr. Reed Pandy.  She should report to radiology for a follow-up CT scan of the abdomen and pelvis on November 25 at 7:45 a.m.  She was instructed to return to the clinic or emergency room should she have increasing temperatures, increasing pain, or worsening symptoms.  CONSULTANTS:  None.  PROCEDURES AND DIAGNOSTIC STUDIES: 1. CT of the abdomen and pelvis done on October 29, 2001.  Impression:    Changes of the left kidney compatible with pyelonephritis with no    obstruction of the renal collecting system.  I cannot exclude a 2 cm area    of early abscess formation of the medial inferior left kidney. 2. Bibasilar atelectasis. 3. Negative CT of the pelvis with the exception of the tiny amount of free    fluid.  HISTORY OF PRESENT ILLNESS:  [name] is a 45 year old white female with no significant past medical history who presented complaining of a three-day history of abdominal cramps.  She said these started on Friday morning.  She denies any change in her bowel movements, nausea, vomiting, or fever at that time.  She was seen in the emergency room and found to have a pansensitive E. coli urinary tract infection.  She had some urinary frequency  but had no dysuria at that time.  She also denied hematuria, fever, or flank pain at that time.  She was treated with Bactrim and Flagyl after undergoing a GYN exam and found to have few clue cells on a KOH wet prep.  She was discharged home from the emergency room.  After she was discharged home, she took one dose each of the Bactrim and Flagyl and began having nausea and vomiting.  She denied any alcohol use or other drug use.  She also denied any vaginal discharge, painful intercourse, or new lesions.  She presents today to the emergency room with a complaint of left flank pain rated 6/10 which started Saturday night.  She also reported subjective fever with an objective measure of 102.9 according to the patient.  She denied any back pain but does report very little urination since Sunday.  She reports nausea and vomiting since last night with no change in her stools.  She has had decreased p.o. intake because of the nausea and vomiting and her last meal was Friday.  She denies any constipation, diarrhea, or bright red blood per rectum.  Her last bowel movement was Friday.  She has no history of kidney stones, no history of frequent UTIs, no history of STDs, or family history of kidney problems.  She had a negative pregnancy test on November 1  and is status post tubal ligation.  Her last menstrual period was October 18, 2001, and reported as normal.  She denies any sick contacts except for her son who is currently inpatient for pneumonia.  She denies any recent travel.  ADMISSION LABORATORIES:  White blood count 13.1, hemoglobin 12.7, platelets 332, MCV 88.9, sodium 132, potassium 2.8, chloride 98, CO2 24, BUN 8, creatinine 0.9, glucose 123.  GC and Chlamydia negative in November 2002. Urine pregnancy test negative.  Urine culture from November 1, E. coli more than 10 to the 6th, pansensitive.  HOSPITAL COURSE: #1 - ACUTE PYELONEPHRITIS WITH LOBAR ______ OR EARLY ABSCESS:  Patient had  a positive urine culture and has been unable to take her p.o. therapy at home secondary to nausea and vomiting.  She now presents with increased white blood cell count, flank pain, and low-grade fever.  She had decreased urine output after failing outpatient treatment.  She was admitted for IV antibiotic therapy and started on Tequin.  She is allergic to penicillin and, therefore, Tequin was chosen as her IV therapy.  She was treated symptomatically with antiemetics and blood cultures were followed.  Her pain improved throughout her hospitalization and a CT scan was obtained which showed early renal abscess and evidence of pyelonephritis without other changes.  Patient continued to have fever of 102.3 on hospital day #2.  She was followed and her blood cultures were negative.  New urine cultures were also negative and final.  She defervesced and, on October 31, 2001, was afebrile with a T max of 98.5.  She was discharged on a 25-day course of p.o. Tequin to be completed in the outpatient.  She was scheduled for outpatient follow-up CT scan to assure that the early renal abscess would resolve.  She was instructed to take her medications every day as directed without missing any doses.  #2 - VAGINITIS:  Patient was treated with Flagyl for proposed bacterial vaginosis with evidence of positive clue cells.  She was instructed not to drink alcohol when she is taking Flagyl.  Patient had no symptoms of vaginal discharge or pain on the day of discharge.  #3 - SMOKING/TOBACCO ABUSE:  The patient was counseled at length on the importance of smoking cessation.  She was started on Wellbutrin for smoking cessation and will be discharged on this medication with adequate follow-up to be done in the clinic after discharge.  #4 - HYPOKALEMIA:  The patients potassium was low on admission and repleted adequately.  The etiology of this is likely secondary to nausea and vomiting. A magnesium was checked  and found to be normal in this patient.  #5 - ANEMIA:  Hemoglobin was somewhat low at 10.6 and a guaiac was done which  was heme negative.  MCV was normocytic.  A workup was performed and the B12 level was found to be normal.  The patient does not show obvious signs of iron deficiency but the etiology of her anemia is likely multifactorial in light of her multiparity state.  We recommended follow-up CBC in the outpatient setting to assure that a hemoglobin remained stable or increases after follow-up.  DISCHARGE LABORATORIES:  Ferritin 86, iron 70, TIBC 215, percent saturation 33, vitamin B 12 370.  Urine cultures negative and final.  Blood cultures negative and final. Dictated by:   Lendell Caprice, M.D. Attending Physician:  Edwyna Perfect DD:  01/16/02 TD:  01/16/02 Job: [id number]

## 2011-05-19 ENCOUNTER — Encounter: Payer: Self-pay | Admitting: Family Medicine

## 2011-05-24 ENCOUNTER — Ambulatory Visit (INDEPENDENT_AMBULATORY_CARE_PROVIDER_SITE_OTHER): Payer: Self-pay | Admitting: Family Medicine

## 2011-05-24 ENCOUNTER — Encounter: Payer: Self-pay | Admitting: Family Medicine

## 2011-05-24 ENCOUNTER — Telehealth: Payer: Self-pay | Admitting: *Deleted

## 2011-05-24 ENCOUNTER — Other Ambulatory Visit (HOSPITAL_COMMUNITY)
Admission: RE | Admit: 2011-05-24 | Discharge: 2011-05-24 | Disposition: A | Discharge status: Home / Self Care | Payer: Self-pay | Source: Ambulatory Visit | Attending: Family Medicine | Admitting: Family Medicine

## 2011-05-24 VITALS — BP 100/62 | HR 65 | Temp 98.6°F | Ht 62.0 in | Wt 127.8 lb

## 2011-05-24 DIAGNOSIS — N390 Urinary tract infection, site not specified: Secondary | ICD-10-CM

## 2011-05-24 DIAGNOSIS — R300 Dysuria: Secondary | ICD-10-CM

## 2011-05-24 DIAGNOSIS — R5383 Other fatigue: Secondary | ICD-10-CM

## 2011-05-24 DIAGNOSIS — Z0000 Encounter for general adult medical examination without abnormal findings: Secondary | ICD-10-CM

## 2011-05-24 DIAGNOSIS — Z01419 Encounter for gynecological examination (general) (routine) without abnormal findings: Secondary | ICD-10-CM | POA: Insufficient documentation

## 2011-05-24 DIAGNOSIS — R5381 Other malaise: Secondary | ICD-10-CM

## 2011-05-24 DIAGNOSIS — Z1159 Encounter for screening for other viral diseases: Secondary | ICD-10-CM | POA: Insufficient documentation

## 2011-05-24 DIAGNOSIS — Z136 Encounter for screening for cardiovascular disorders: Secondary | ICD-10-CM

## 2011-05-24 DIAGNOSIS — Z1231 Encounter for screening mammogram for malignant neoplasm of breast: Secondary | ICD-10-CM

## 2011-05-24 LAB — POCT URINALYSIS DIPSTICK
Bilirubin, UA: NEGATIVE
Glucose, UA: NEGATIVE
Ketones, UA: NEGATIVE
Leukocytes, UA: NEGATIVE
Nitrite, UA: NEGATIVE
Protein, UA: NEGATIVE
Urobilinogen, UA: NEGATIVE
pH, UA: 6

## 2011-05-24 LAB — CBC WITH DIFFERENTIAL/PLATELET
Eosinophils Relative: 2.9 % (ref 0.0–5.0)
Hemoglobin: 13.5 g/dL (ref 12.0–15.0)
Lymphocytes Relative: 24 % (ref 12.0–46.0)
MCV: 96.9 fl (ref 78.0–100.0)
Monocytes Absolute: 0.4 10*3/uL (ref 0.1–1.0)
Monocytes Relative: 5.6 % (ref 3.0–12.0)
Neutro Abs: 5.2 10*3/uL (ref 1.4–7.7)
Platelets: 401 10*3/uL — ABNORMAL HIGH (ref 150.0–400.0)
WBC: 7.8 10*3/uL (ref 4.5–10.5)

## 2011-05-24 LAB — BASIC METABOLIC PANEL
Calcium: 9.3 mg/dL (ref 8.4–10.5)
Chloride: 107 mEq/L (ref 96–112)
Creatinine, Ser: 0.8 mg/dL (ref 0.4–1.2)
GFR: 87.33 mL/min (ref >60.00)

## 2011-05-24 LAB — LIPID PANEL
Total CHOL/HDL Ratio: 4
VLDL: 15.6 mg/dL (ref 0.0–40.0)

## 2011-05-24 MED ORDER — TRAZODONE HCL 100 MG PO TABS: 100.0000 mg | ORAL_TABLET | Freq: Every day | ORAL | Status: DC

## 2011-05-24 MED ORDER — NORETHINDRONE ACET-ETHINYL EST 1-20 MG-MCG PO TABS: 1.0000 | ORAL_TABLET | Freq: Every day | ORAL | Status: DC

## 2011-05-24 MED ORDER — CIPROFLOXACIN HCL 250 MG PO TABS: 250.0000 mg | ORAL_TABLET | Freq: Two times a day (BID) | ORAL | Status: AC

## 2011-05-24 MED ORDER — SUMATRIPTAN SUCCINATE 100 MG PO TABS: 100.0000 mg | ORAL_TABLET | ORAL | Status: DC | PRN

## 2011-05-24 MED ORDER — PROMETHAZINE HCL 25 MG PO TABS: 25.0000 mg | ORAL_TABLET | Freq: Four times a day (QID) | ORAL | Status: DC | PRN

## 2011-05-24 NOTE — Telephone Encounter (Signed)
Pt was here earlier today and was told that promethazine would be called in with her other meds but it wasn't.  She is asking that this be sent to [facility] [address].

## 2011-05-24 NOTE — Progress Notes (Signed)
45 yo presents for CPE/PAP.  1. Well woman- had one abnormal pap in past, since then have been normal.  Sexually active with husband only a/ps BTL.  No vaginal discharge.  2.  Menorrhagia- Junel was helping but ran out several months ago.  Now periods are extremely heavy and she feels weak and tired while on her periods.  3.  Urinary frequency and urgency- ongoing for past 2 weeks, associated with RLQ pain (s/p appendectomy). Urine has strong odor. No dysuria, fever or back pain.  The PMH, PSH, Social History, Family History, Medications, and allergies have been reviewed in [facility], and have been updated if relevant.   Review of Systems       See HPI General:  Denies loss of appetite. Eyes:  Denies blurring. CV:  denies leg cramps with exertion, palpitations, shortness of breath with exertion, swelling of feet, swelling of hands, and weight gain. Resp:  Denies shortness of breath and wheezing. GI:  Denies abdominal pain, bloody stools, and change in bowel habits. GU:  Complains of abnormal vaginal bleeding; denies discharge and dysuria. Derm:  Denies rash. Psych:  denies suicidal thoughts/plans and thoughts of violence. Heme:  Denies abnormal bruising.  Physical Exam BP 100/62  Pulse 65  Temp(Src) 98.6 F (37 C) (Oral)  Ht 5\' 2"  (1.575 m)  Wt 127 lb 12.8 oz (57.97 kg)  BMI 23.38 kg/m2  LMP 04/29/2011 General:  underweight appearing, alert, pleasant.   Eyes:  No corneal or conjunctival inflammation noted. EOMI. Perrla. Funduscopic exam benign, without hemorrhages, exudates or papilledema. Vision grossly normal. Ears:  External ear exam shows no significant lesions or deformities.  Otoscopic examination reveals clear canals, tympanic membranes are intact bilaterally without bulging, retraction, inflammation or discharge. Hearing is grossly normal bilaterally. Nose:  External nasal examination shows no deformity or inflammation. Nasal mucosa are pink and moist without lesions or  exudates. Mouth:  poor dentition.   Neck:  No deformities, masses, or tenderness noted. No bruits. Breasts:  No mass, nodules, thickening, tenderness, bulging, retraction, inflamation, nipple discharge or skin changes noted.   Lungs:  normal respiratory effort, occasional exp wheezes.  normal respiratory effort and no crackles.   Heart:  Normal rate and regular rhythm. S1 and S2 normal without gallop, murmur, click, rub or other extra sounds. Abdomen:  Bowel sounds positive,abdomen soft and non-tender without masses, organomegaly or hernias noted. Genitalia:  Pelvic Exam:        External: normal female genitalia without lesions or masses        Vagina: normal without lesions or masses        Cervix: normal without lesions or masses        Adnexa: normal bimanual exam without masses or fullness        Uterus: normal by palpation        Pap smear: performed Msk:  No deformity or scoliosis noted of thoracic or lumbar spine.   Extremities:  No clubbing, cyanosis, edema, or deformity noted with normal full range of motion of all joints.   Skin:  Intact without suspicious lesions or rashes Psych:  normally interactive, good eye contact, not anxious appearing, and not depressed appearing.

## 2011-05-24 NOTE — Patient Instructions (Addendum)
Great to see you. Please stop by to see Shirlee Limerick on your way out.     Urinary Tract Infection (UTI) Infections of the urinary tract can start in several places. A bladder infection (cystitis), a kidney infection (pyelonephritis), and a prostate infection (prostatitis) are different types of urinary tract infections. They usually get better if treated with medicines (antibiotics) that kill germs. Take all the medicine until it is gone. You or your child may feel better in a few days, but TAKE ALL MEDICINE or the infection may not respond and may become more difficult to treat. HOME CARE INSTRUCTIONS  Drink enough water and fluids to keep the urine clear or pale yellow. Cranberry juice is especially recommended, in addition to large amounts of water.   Avoid caffeine, tea, and carbonated beverages. They tend to irritate the bladder.   Alcohol may irritate the prostate.   Only take over-the-counter or prescription medicines for pain, discomfort, or fever as directed by your caregiver.  FINDING OUT THE RESULTS OF YOUR TEST Not all test results are available during your visit. If your or your child's test results are not back during the visit, make an appointment with your caregiver to find out the results. Do not assume everything is normal if you have not heard from your caregiver or the medical facility. It is important for you to follow up on all test results. TO PREVENT FURTHER INFECTIONS:  Empty the bladder often. Avoid holding urine for long periods of time.   After a bowel movement, women should cleanse from front to back. Use each tissue only once.   Empty the bladder before and after sexual intercourse.  SEEK MEDICAL CARE IF:  There is back pain.   You or your child has an oral temperature above 100.4  Your or your child's problems (symptoms) are no better in 3 days. Return sooner if you or your child is getting worse.  SEEK IMMEDIATE MEDICAL CARE IF:  There is severe back pain or  lower abdominal pain.   You or your child develops chills.   You or your child has an oral temperature above 100.4, not controlled by medicine.   Your baby is older than 3 months with a rectal temperature of 102 F (38.9 C) or higher.   There is nausea or vomiting.   There is continued burning or discomfort with urination.  MAKE SURE YOU:  Understand these instructions.   Will watch this condition.   Will get help right away if you or your child is not doing well or gets worse.  Document Released: 09/19/2005 Document Re-Released: 03/06/2010 [facility] Patient Information 2011 [place], [place].

## 2011-05-24 NOTE — Telephone Encounter (Signed)
I just resent it.

## 2011-05-24 NOTE — Assessment & Plan Note (Signed)
Deteriorated. Likely due to menorrhagia. Restart OCPs, check CBC.

## 2011-05-24 NOTE — Telephone Encounter (Signed)
Recorded a message on phone tree for patient advising her of her lab results.

## 2011-05-24 NOTE — Assessment & Plan Note (Signed)
Reviewed preventive care protocols, scheduled due services, and updated immunizations Discussed nutrition, exercise, diet, and healthy lifestyle.  

## 2011-05-24 NOTE — Telephone Encounter (Signed)
Patient advised as instructed via telephone. 

## 2011-05-24 NOTE — Assessment & Plan Note (Signed)
New. Cipro 250 mg twice daily x 5 days. Send urine for culture.

## 2011-05-26 LAB — URINE CULTURE
Colony Count: NO GROWTH
Organism ID, Bacteria: NO GROWTH

## 2011-05-29 ENCOUNTER — Encounter: Payer: Self-pay | Admitting: *Deleted

## 2011-05-30 ENCOUNTER — Telehealth: Payer: Self-pay | Admitting: *Deleted

## 2011-05-30 MED ORDER — FLUCONAZOLE 150 MG PO TABS: 150.0000 mg | ORAL_TABLET | Freq: Once | ORAL | Status: AC

## 2011-05-30 NOTE — Telephone Encounter (Signed)
Urine culture showed no growth. We can try one time dose of Diflucan to cover vaginal yeast infection sometimes caused by abx. I will send to her pharmacy. If burning persists, needs to be seen.

## 2011-05-30 NOTE — Telephone Encounter (Signed)
Patient advised as instructed via telephone. 

## 2011-05-30 NOTE — Telephone Encounter (Signed)
Patient called back to let Dr. Dayton Martes know that she is still having burning upon urination and abdominal cramps.  She stated that the burning started three days ago.  Please advise.  Uses [facility].

## 2011-06-01 ENCOUNTER — Ambulatory Visit: Payer: Self-pay

## 2012-01-24 ENCOUNTER — Other Ambulatory Visit: Payer: Self-pay | Admitting: Family Medicine

## 2012-06-25 ENCOUNTER — Other Ambulatory Visit: Payer: Self-pay | Admitting: Family Medicine

## 2012-06-27 NOTE — Telephone Encounter (Signed)
Overdue for CPX, last seen 04/2011.  Needs Dr.Aron's approval before refill. Likely needs CPX appt first.

## 2012-09-25 ENCOUNTER — Other Ambulatory Visit: Payer: Self-pay | Admitting: Family Medicine

## 2012-09-25 NOTE — Telephone Encounter (Signed)
Ok to approve one month. No further refills without an appt.

## 2012-09-25 NOTE — Telephone Encounter (Signed)
Pt hasn't been seen in over a year and has no upcoming appt's scheduled.

## 2012-10-27 ENCOUNTER — Other Ambulatory Visit: Payer: Self-pay | Admitting: Family Medicine

## 2012-10-28 NOTE — Telephone Encounter (Signed)
This patient has not been seen since 04/2011 and has no upcoming appts scheduled.

## 2012-10-28 NOTE — Telephone Encounter (Signed)
Ok to refill one month only- needs to be seen for further refills. 

## 2012-11-25 ENCOUNTER — Ambulatory Visit (INDEPENDENT_AMBULATORY_CARE_PROVIDER_SITE_OTHER): Payer: Self-pay | Admitting: Family Medicine

## 2012-11-25 NOTE — Progress Notes (Signed)
Patient cancelled

## 2012-11-27 ENCOUNTER — Encounter: Payer: Self-pay | Admitting: Family Medicine

## 2012-11-27 ENCOUNTER — Ambulatory Visit (INDEPENDENT_AMBULATORY_CARE_PROVIDER_SITE_OTHER): Payer: Self-pay | Admitting: Family Medicine

## 2012-11-27 VITALS — BP 122/60 | HR 64 | Temp 98.1°F | Wt 134.0 lb

## 2012-11-27 DIAGNOSIS — F419 Anxiety disorder, unspecified: Secondary | ICD-10-CM

## 2012-11-27 DIAGNOSIS — G4700 Insomnia, unspecified: Secondary | ICD-10-CM

## 2012-11-27 DIAGNOSIS — F411 Generalized anxiety disorder: Secondary | ICD-10-CM

## 2012-11-27 MED ORDER — CLONAZEPAM 1 MG PO TABS: 1.0000 mg | ORAL_TABLET | Freq: Two times a day (BID) | ORAL | Status: DC | PRN

## 2012-11-27 MED ORDER — PROMETHAZINE HCL 25 MG PO TABS: ORAL_TABLET | ORAL | Status: DC

## 2012-11-27 MED ORDER — TRAZODONE HCL 150 MG PO TABS: 150.0000 mg | ORAL_TABLET | Freq: Every day | ORAL | Status: DC

## 2012-11-27 NOTE — Progress Notes (Signed)
  Subjective:    Patient ID: Ashley [name], female    DOB: [birth year], 46 y.o.   MRN: [id number]  HPI  46 yo here to discuss worsening anxiety/insomnia.  Less interested in spending time with her family.  She is more tearful. Trazadone 100 mg qhs used to work to help her sleep, but she is no longer sleeping.  Took one of her husband's xanax tablets and felt much better.  No SI or HI.  Appetite fair.  Wt Readings from Last 3 Encounters:  11/27/12 134 lb (60.782 kg)  05/24/11 127 lb 12.8 oz (57.97 kg)  09/12/10 125 lb (56.7 kg)   Patient Active Problem List  Diagnosis  . ANXIETY  . TOBACCO ABUSE  . INSOMNIA, CHRONIC  . ADJUSTMENT DISORDER WITH MIXED FEATURES  . MIGRAINE HEADACHE  . URI  . BREAST MASS, RIGHT  . MENORRHAGIA, PERIMENOPAUSAL  . FATIGUE  . HEADACHE  . CHEST PAIN  . ELEVATED BLOOD PRESSURE WITHOUT DIAGNOSIS OF HYPERTENSION  . UTI (lower urinary tract infection)  . Routine general medical examination at a health care facility   Past Medical History  Diagnosis Date  . Anxiety   . Tobacco abuse    Past Surgical History  Procedure Date  . Appendectomy 1988   History  Substance Use Topics  . Smoking status: Current Every Day Smoker  . Smokeless tobacco: Not on file  . Alcohol Use: No   Family History  Problem Relation Age of Onset  . Diabetes Mother   . Hyperlipidemia Mother   . Hypertension Mother    Allergies  Allergen Reactions  . Penicillins     REACTION: Throat swells   Current Outpatient Prescriptions on File Prior to Visit  Medication Sig Dispense Refill  . [DISCONTINUED] promethazine (PHENERGAN) 25 MG tablet TAKE ONE TABLET BY MOUTH EVERY 6 HOURS AS NEEDED  30 tablet  0  . [DISCONTINUED] traZODone (DESYREL) 100 MG tablet TAKE ONE TABLET BY MOUTH EVERY DAY AT BEDTIME(PT MUST BE SEEN FOR FURTHER REFILLS)  30 tablet  0  . clonazePAM (KLONOPIN) 1 MG tablet Take 1 tablet (1 mg total) by mouth 2 (two) times daily as needed for anxiety.  30 tablet   1   The PMH, PSH, Social History, Family History, Medications, and allergies have been reviewed in [facility], and have been updated if relevant.   Review of Systems See HPI    Objective:   Physical Exam BP 122/60  Pulse 64  Temp 98.1 F (36.7 C)  Wt 134 lb (60.782 kg)  General:  Well-developed,well-nourished,in no acute distress; alert,appropriate and cooperative throughout examination Head:  normocephalic and atraumatic.   Skin:  Intact without suspicious lesions or rashes Psych:  Cognition and judgment appear intact. Alert and cooperative with normal attention span and concentration. No apparent delusions, illusions, hallucinations     Assessment & Plan:  1. Anxiety-  >15 min spent with face to face with patient, >50% counseling and/or coordinating care. Deteriorated.   Will add Klonipin 1 mg twice daily prn anxiety.  Discussed sedation and addiction potential. Also increase Trazadone to 150 mg qhs.  2.  Insomnia- Deteriorated.  See above.

## 2012-11-27 NOTE — Patient Instructions (Addendum)
Good to see you. Hang in there.  We are increasing your Trazadone to 150 mg nightly. We are adding Klonipin 1 mg twice daily as needed.

## 2012-12-16 ENCOUNTER — Telehealth: Payer: Self-pay | Admitting: Family Medicine

## 2012-12-16 NOTE — Telephone Encounter (Signed)
[facility] Triage Call Report Triage Record Num: [id number] Operator: Merlinda Frederick Patient Name: Ashley [name] Call Date & Time: 12/15/2012 5:32:57PM Patient Phone: [phone] PCP: Ruthe Mannan Patient Gender: Female PCP Fax : [phone] Patient DOB: [birth year] Practice Name: [facility] Reason for Call: Caller: Elodie/Patient; PCP: Ruthe Mannan (Family Practice); CB#: [phone]; Call regarding R breast soreness; Onset approx 4 months ago. LMP 12/01/12. Pt would like Dr. Dayton Martes to order a mammogram for her in January because she now has insurance. Triaged per Breast Symptoms Guideline, see in 72 hrs disposition for "breast swollen." Pt declines appt at this time due to lack of insurance. Encouraged pt to consider being seen, especially if symptoms worsen, verbalized understanding. Care advice given per guideline. Protocol(s) Used: Breast Symptoms - Female Recommended Outcome per Protocol: See Provider within 72 Hours Reason for Outcome: Breast swollen Care Advice: ~ Apply warm soaks to the affected breast(s) for 20 to 30 minutes 3 or 4 times a day. ~ Call provider if symptoms worsen or new symptoms develop. During pregnancy, call provider if temperature is 100 F (37.7 C) or greater OR any temperature elevation for 3 days even while taking acetaminophen. ~ ~ Call provider immediately if temperature increases to 101.5 F (38.6 C) or greater or you generally feel ill. Analgesic/Antipyretic Advice - Acetaminophen: Consider acetaminophen as directed on label or by pharmacist/provider for pain or fever PRECAUTIONS: - Use if there is no history of liver disease, alcoholism, or intake of three or more alcohol drinks per day - Only if approved by provider during pregnancy or when breastfeeding - During pregnancy, acetaminophen should not be taken more than 3 consecutive days without telling provider - Do not exceed recommended dose or frequency ~ Analgesic/Antipyretic Advice -  NSAIDs: Consider aspirin, ibuprofen, naproxen or ketoprofen for pain or fever as directed on label or by pharmacist/provider. PRECAUTIONS: - If over 71 years of age, should not take longer than 1 week without consulting provider. EXCEPTIONS: - Should not be used if taking blood thinners or have bleeding problems. - Do not use if have history of sensitivity/allergy to any of these medications; or history of cardiovascular, ulcer, kidney, liver disease or diabetes unless approved by provider. - Do not exceed recommended dose or frequency. ~ 12/15/2012 5:41:35PM Page 1 of 1 [id number]

## 2012-12-22 NOTE — Telephone Encounter (Signed)
Please call to check on pt. 

## 2012-12-23 NOTE — Telephone Encounter (Signed)
Left message asking patient to call back

## 2013-01-02 NOTE — Telephone Encounter (Signed)
Patient has appt here for physical next month.

## 2013-01-14 ENCOUNTER — Other Ambulatory Visit: Payer: Self-pay | Admitting: Family Medicine

## 2013-01-14 DIAGNOSIS — Z0000 Encounter for general adult medical examination without abnormal findings: Secondary | ICD-10-CM

## 2013-01-14 DIAGNOSIS — Z136 Encounter for screening for cardiovascular disorders: Secondary | ICD-10-CM

## 2013-01-19 ENCOUNTER — Other Ambulatory Visit (INDEPENDENT_AMBULATORY_CARE_PROVIDER_SITE_OTHER): Payer: Self-pay

## 2013-01-19 DIAGNOSIS — Z0000 Encounter for general adult medical examination without abnormal findings: Secondary | ICD-10-CM

## 2013-01-19 DIAGNOSIS — R7309 Other abnormal glucose: Secondary | ICD-10-CM

## 2013-01-19 DIAGNOSIS — Z136 Encounter for screening for cardiovascular disorders: Secondary | ICD-10-CM

## 2013-01-19 LAB — LIPID PANEL
Total CHOL/HDL Ratio: 6
VLDL: 17.8 mg/dL (ref 0.0–40.0)

## 2013-01-19 LAB — COMPREHENSIVE METABOLIC PANEL
ALT: 12 U/L (ref 0–35)
CO2: 26 mEq/L (ref 19–32)
GFR: 88.03 mL/min (ref >60.00)
Sodium: 139 mEq/L (ref 135–145)
Total Bilirubin: 0.5 mg/dL (ref 0.3–1.2)
Total Protein: 7.5 g/dL (ref 6.0–8.3)

## 2013-01-19 LAB — LDL CHOLESTEROL, DIRECT: Direct LDL: 152.1 mg/dL

## 2013-01-19 LAB — HEMOGLOBIN A1C: Hgb A1c MFr Bld: 5.8 % (ref 4.6–6.5)

## 2013-01-26 ENCOUNTER — Ambulatory Visit (INDEPENDENT_AMBULATORY_CARE_PROVIDER_SITE_OTHER): Payer: BC Managed Care – PPO | Admitting: Family Medicine

## 2013-01-26 ENCOUNTER — Encounter: Payer: Self-pay | Admitting: Family Medicine

## 2013-01-26 ENCOUNTER — Other Ambulatory Visit (HOSPITAL_COMMUNITY)
Admission: RE | Admit: 2013-01-26 | Discharge: 2013-01-26 | Disposition: A | Discharge status: Home / Self Care | Payer: BC Managed Care – PPO | Source: Ambulatory Visit | Attending: Family Medicine | Admitting: Family Medicine

## 2013-01-26 VITALS — BP 120/78 | HR 60 | Temp 98.2°F | Ht 62.5 in | Wt 130.0 lb

## 2013-01-26 DIAGNOSIS — F411 Generalized anxiety disorder: Secondary | ICD-10-CM

## 2013-01-26 DIAGNOSIS — IMO0002 Reserved for concepts with insufficient information to code with codable children: Secondary | ICD-10-CM

## 2013-01-26 DIAGNOSIS — G4700 Insomnia, unspecified: Secondary | ICD-10-CM

## 2013-01-26 DIAGNOSIS — F17200 Nicotine dependence, unspecified, uncomplicated: Secondary | ICD-10-CM

## 2013-01-26 DIAGNOSIS — Z0000 Encounter for general adult medical examination without abnormal findings: Secondary | ICD-10-CM

## 2013-01-26 DIAGNOSIS — Z01419 Encounter for gynecological examination (general) (routine) without abnormal findings: Secondary | ICD-10-CM | POA: Insufficient documentation

## 2013-01-26 DIAGNOSIS — R739 Hyperglycemia, unspecified: Secondary | ICD-10-CM | POA: Insufficient documentation

## 2013-01-26 DIAGNOSIS — N63 Unspecified lump in unspecified breast: Secondary | ICD-10-CM

## 2013-01-26 DIAGNOSIS — N6310 Unspecified lump in the right breast, unspecified quadrant: Secondary | ICD-10-CM

## 2013-01-26 DIAGNOSIS — R7309 Other abnormal glucose: Secondary | ICD-10-CM

## 2013-01-26 MED ORDER — METFORMIN HCL ER 500 MG PO TB24: 500.0000 mg | ORAL_TABLET | Freq: Every day | ORAL | Status: DC

## 2013-01-26 MED ORDER — CLONAZEPAM 1 MG PO TABS: 1.0000 mg | ORAL_TABLET | Freq: Two times a day (BID) | ORAL | Status: DC | PRN

## 2013-01-26 NOTE — Patient Instructions (Addendum)
Good to see you. I am so proud of you for quitting smoking!  We are starting Metformin 500 mg daily with breakfast.  Please come back in 3 months (for labs only) to recheck your blood sugars.  Please stop by to see Shirlee Limerick to set up your mammogram and ultrasound

## 2013-01-26 NOTE — Addendum Note (Signed)
Addended by: Dianne Dun on: 01/26/2013 09:19 AM   Modules accepted: Orders

## 2013-01-26 NOTE — Progress Notes (Signed)
47 here for CPX with multiple complaints.  1. Well woman- had one abnormal pap in past, since then have been normal.  Sexually active with husband only a/ps BTL.  No vaginal discharge. Last pap normal, 04/2011 (see Epic).  She does want pap smear today.  She thinks she is perimenopausal.  Having multiple periods this month but did not bleed last month. Also experiencing vaginal dryness and dyspareunia.  OTC vaginal lubricants not helping.  2.  Anxiety/insomnia-  Stable on Trazadone and klonipin.  3.  Tobacco abuse-  Ready to quit.  Ran out of cigarettes and has not bought more!  4.  HLD-  Lab Results  Component Value Date   CHOL 207* 01/19/2013   HDL 34.20* 01/19/2013   LDLCALC 98 05/24/2011   LDLDIRECT 152.1 01/19/2013   TRIG 89.0 01/19/2013   CHOLHDL 6 01/19/2013   5.  Right breast mass and breast pain- for over a year has felt a lump that is now painful.  Due for screening mammogram. No known FH of breast CA.  6.  Hyperglycemia- fasting CBG was 124 this month.  Insists she did not have anything to eat or drink.  a1c was 5.8  Has noticed increased thirst and urination.  Strong FH of DM.  Patient Active Problem List  Diagnosis  . ANXIETY  . TOBACCO ABUSE  . INSOMNIA, CHRONIC  . ADJUSTMENT DISORDER WITH MIXED FEATURES  . MIGRAINE HEADACHE  . ELEVATED BLOOD PRESSURE WITHOUT DIAGNOSIS OF HYPERTENSION  . Routine general medical examination at a health care facility   Past Medical History  Diagnosis Date  . Anxiety   . Tobacco abuse    Past Surgical History  Procedure Date  . Appendectomy 1988   History  Substance Use Topics  . Smoking status: Current Every Day Smoker  . Smokeless tobacco: Not on file  . Alcohol Use: No   Family History  Problem Relation Age of Onset  . Diabetes Mother   . Hyperlipidemia Mother   . Hypertension Mother    Allergies  Allergen Reactions  . Penicillins     REACTION: Throat swells   Current Outpatient Prescriptions on File Prior to Visit   Medication Sig Dispense Refill  . clonazePAM (KLONOPIN) 1 MG tablet Take 1 tablet (1 mg total) by mouth 2 (two) times daily as needed for anxiety.  30 tablet  1  . promethazine (PHENERGAN) 25 MG tablet TAKE ONE TABLET BY MOUTH EVERY 6 HOURS AS NEEDED  30 tablet  0  . traZODone (DESYREL) 150 MG tablet Take 1 tablet (150 mg total) by mouth at bedtime.  30 tablet  11     The PMH, PSH, Social History, Family History, Medications, and allergies have been reviewed in [facility], and have been updated if relevant.   Review of Systems       See HPI Patient reports no  vision/ hearing changes,anorexia, weight change, fever ,adenopathy, persistant / recurrent hoarseness, swallowing issues, chest pain, edema,persistant / recurrent cough, hemoptysis, dyspnea(rest, exertional, paroxysmal nocturnal), gastrointestinal  bleeding (melena, rectal bleeding), abdominal pain, excessive heart burn  Physical Exam BP 120/78  Pulse 60  Temp 98.2 F (36.8 C)  Ht 5' 2.5" (1.588 m)  Wt 130 lb (58.968 kg)  BMI 23.40 kg/m2 General:  underweight appearing, alert, pleasant.   Eyes:  No corneal or conjunctival inflammation noted. EOMI. Perrla. Funduscopic exam benign, without hemorrhages, exudates or papilledema. Vision grossly normal. Ears:  R ear normal and L ear normal.   Nose:  no external deformity.   Mouth:  good dentition.   Neck:  No deformities, masses, or tenderness noted. Breasts: Right breast- small freely movable mass right lateral edge of breast- TTP with pain radiating to right axilla Lungs:  Normal respiratory effort, chest expands symmetrically. Lungs are clear to auscultation, no crackles or wheezes. Heart:  Normal rate and regular rhythm. S1 and S2 normal without gallop, murmur, click, rub or other extra sounds. Abdomen:  Bowel sounds positive,abdomen soft and non-tender without masses, organomegaly or hernias noted. Rectal:  no external abnormalities.   Genitalia:  Pelvic Exam:        External:  normal female genitalia without lesions or masses        Vagina: normal without lesions or masses        Cervix: normal without lesions or masses        Adnexa: normal bimanual exam without masses or fullness        Uterus: normal by palpation        Pap smear: performed- she is very tender throughout exam Msk:  No deformity or scoliosis noted of thoracic or lumbar spine.   Extremities:  No clubbing, cyanosis, edema, or deformity noted with normal full range of motion of all joints.   Neurologic:  alert & oriented X3 and gait normal.   Skin:  Intact without suspicious lesions or rashes Cervical Nodes:  No lymphadenopathy noted Axillary Nodes:  No palpable lymphadenopathy Psych:  Cognition and judgment appear intact. Alert and cooperative with normal attention span and concentration. No apparent delusions, illusions, hallucinations  Assessment and Plan: 1. TOBACCO ABUSE  Improved.  Congratulated her on quitting.   2. Routine general medical examination at a health care facility  Reviewed preventive care protocols, scheduled due services, and updated immunizations Discussed nutrition, exercise, diet, and healthy lifestyle.  Cytology - PAP  3. INSOMNIA, CHRONIC  Stable on current meds.   4. ANXIETY  Continue current meds.   5. Breast mass, right  New- due for screening mammogram as well. Will order bilateral diagnostic mammogram. The patient indicates understanding of these issues and agrees with the plan.  MM Digital Diagnostic Bilat  6. Hyperglycemia  New without diabetes.  Will start Metformin 500 mg daily. Follow up a1c in 3 months. Hemoglobin A1c  7. Dyspareunia  New- likely due to perimenopause but is quite tender on exam.  Will order pelvic ultrasound for further evaluation. The patient indicates understanding of these issues and agrees with the plan.  US Pelvis Complete

## 2013-01-28 ENCOUNTER — Ambulatory Visit
Admission: RE | Admit: 2013-01-28 | Discharge: 2013-01-28 | Disposition: A | Discharge status: Home / Self Care | Payer: BC Managed Care – PPO | Source: Ambulatory Visit | Attending: Family Medicine | Admitting: Family Medicine

## 2013-01-28 ENCOUNTER — Encounter: Payer: Self-pay | Admitting: Family Medicine

## 2013-01-28 ENCOUNTER — Other Ambulatory Visit: Payer: Self-pay | Admitting: Family Medicine

## 2013-01-28 DIAGNOSIS — IMO0002 Reserved for concepts with insufficient information to code with codable children: Secondary | ICD-10-CM

## 2013-01-28 DIAGNOSIS — R102 Pelvic and perineal pain: Secondary | ICD-10-CM

## 2013-01-28 LAB — HM PAP SMEAR: HM Pap smear: NORMAL

## 2013-02-10 ENCOUNTER — Ambulatory Visit
Admission: RE | Admit: 2013-02-10 | Discharge: 2013-02-10 | Disposition: A | Discharge status: Home / Self Care | Payer: BC Managed Care – PPO | Source: Ambulatory Visit | Attending: Family Medicine | Admitting: Family Medicine

## 2013-02-10 ENCOUNTER — Other Ambulatory Visit: Payer: Self-pay | Admitting: Family Medicine

## 2013-02-10 DIAGNOSIS — N6310 Unspecified lump in the right breast, unspecified quadrant: Secondary | ICD-10-CM

## 2013-04-06 ENCOUNTER — Other Ambulatory Visit: Payer: Self-pay | Admitting: Family Medicine

## 2013-04-27 ENCOUNTER — Other Ambulatory Visit (INDEPENDENT_AMBULATORY_CARE_PROVIDER_SITE_OTHER): Payer: BC Managed Care – PPO

## 2013-04-27 ENCOUNTER — Other Ambulatory Visit: Payer: Self-pay

## 2013-04-27 DIAGNOSIS — R739 Hyperglycemia, unspecified: Secondary | ICD-10-CM

## 2013-04-27 DIAGNOSIS — R7309 Other abnormal glucose: Secondary | ICD-10-CM

## 2013-04-27 MED ORDER — CLONAZEPAM 1 MG PO TABS: 1.0000 mg | ORAL_TABLET | Freq: Two times a day (BID) | ORAL | Status: DC | PRN

## 2013-04-27 NOTE — Telephone Encounter (Signed)
Medicine called to [facility] per pt's request.

## 2013-04-27 NOTE — Telephone Encounter (Signed)
Pt left note requesting written rx clonazepam. Call when ready for pick up.

## 2013-05-07 ENCOUNTER — Ambulatory Visit (INDEPENDENT_AMBULATORY_CARE_PROVIDER_SITE_OTHER): Payer: BC Managed Care – PPO | Admitting: Family Medicine

## 2013-05-07 ENCOUNTER — Encounter: Payer: Self-pay | Admitting: Family Medicine

## 2013-05-07 VITALS — BP 112/70 | HR 75 | Temp 98.2°F | Wt 123.0 lb

## 2013-05-07 DIAGNOSIS — J209 Acute bronchitis, unspecified: Secondary | ICD-10-CM

## 2013-05-07 MED ORDER — AZITHROMYCIN 250 MG PO TABS: ORAL_TABLET | ORAL | Status: DC

## 2013-05-07 NOTE — Progress Notes (Signed)
SUBJECTIVE:  Ashley [name] is a 47 y.o. female who complains of coryza, congestion, sore throat, productive cough and fever for 21 days. She denies a history of anorexia and chest pain and admits to a history of asthma. Patient admits to smoke cigarettes.  PCN as allergic.   Patient Active Problem List   Diagnosis Date Noted  . Hyperglycemia 01/26/2013  . Dyspareunia 01/26/2013  . Routine general medical examination at a health care facility 05/24/2011  . MIGRAINE HEADACHE 05/30/2010  . ANXIETY 09/30/2009  . TOBACCO ABUSE 09/30/2009  . INSOMNIA, CHRONIC 09/30/2009  . ADJUSTMENT DISORDER WITH MIXED FEATURES 09/30/2009  . ELEVATED BLOOD PRESSURE WITHOUT DIAGNOSIS OF HYPERTENSION 09/30/2009   Past Medical History  Diagnosis Date  . Anxiety   . Tobacco abuse    Past Surgical History  Procedure Laterality Date  . Appendectomy  1988   History  Substance Use Topics  . Smoking status: Current Every Day Smoker  . Smokeless tobacco: Not on file  . Alcohol Use: No   Family History  Problem Relation Age of Onset  . Diabetes Mother   . Hyperlipidemia Mother   . Hypertension Mother    Allergies  Allergen Reactions  . Penicillins     REACTION: Throat swells   Current Outpatient Prescriptions on File Prior to Visit  Medication Sig Dispense Refill  . clonazePAM (KLONOPIN) 1 MG tablet Take 1 tablet (1 mg total) by mouth 2 (two) times daily as needed for anxiety.  60 tablet  1  . metFORMIN (GLUCOPHAGE-XR) 500 MG 24 hr tablet TAKE ONE TABLET BY MOUTH EVERY DAY WITH BREAKFAST  30 tablet  5  . promethazine (PHENERGAN) 25 MG tablet TAKE ONE TABLET BY MOUTH EVERY 6 HOURS AS NEEDED  30 tablet  0  . traZODone (DESYREL) 150 MG tablet Take 1 tablet (150 mg total) by mouth at bedtime.  30 tablet  11   No current facility-administered medications on file prior to visit.   The PMH, PSH, Social History, Family History, Medications, and allergies have been reviewed in [facility], and have been updated if  relevant.  OBJECTIVE: BP 112/70  Pulse 75  Temp(Src) 98.2 F (36.8 C)  Wt 123 lb (55.792 kg)  BMI 22.12 kg/m2  SpO2 97%  She appears well, vital signs are as noted. Ears normal.  Throat and pharynx normal.  Neck supple. No adenopathy in the neck. Nose is congested. Sinuses non tender.  Scattered exp wheezes, no ronhci  ASSESSMENT:  bronchitis  PLAN: Symptomatic therapy suggested: push fluids, rest and return office visit prn if symptoms persist or worsen.  Given duration and progression of symptoms, and tobacco abuse, will treat for bacterial process. Call or return to clinic prn if these symptoms worsen or fail to improve as anticipated.

## 2013-05-07 NOTE — Patient Instructions (Addendum)
Good to see you. Take Zpack as directed. Use ventolin inhaler- 2 puffs every 4 hours as needed. Call me if no better by next week.

## 2013-07-06 ENCOUNTER — Other Ambulatory Visit: Payer: Self-pay | Admitting: Family Medicine

## 2013-07-06 ENCOUNTER — Telehealth: Payer: Self-pay | Admitting: Family Medicine

## 2013-07-06 ENCOUNTER — Ambulatory Visit: Payer: Self-pay | Admitting: Family Medicine

## 2013-07-06 MED ORDER — PROMETHAZINE HCL 25 MG PO TABS: ORAL_TABLET | ORAL | Status: DC

## 2013-07-06 NOTE — Telephone Encounter (Signed)
Received faxed refill request from pharmacy. Last office visit 05/07/13. Is it okay to refill?

## 2013-07-06 NOTE — Telephone Encounter (Signed)
[facility] Triage Call Report Triage Record Num: [id number] Operator: Durward Mallard DiMatteis Patient Name: Ashley [name] Call Date & Time: 07/05/2013 9:22:48AM Patient Phone: [phone] PCP: Ruthe Mannan Patient Gender: Female PCP Fax : [phone] Patient DOB: 08/02/1966 Practice Name: [facility] Reason for Call: Caller: Cierra/Patient; PCP: Ruthe Mannan (Family Practice); CB#: [phone]; Call regarding blister on left wrist; sx started 07/05/13; one blister noted; approx larger than mosquito bite; fluid filled; slightly red; no itching; Triaged per Skin Lesions Guideline; See in 24 hours due to new onset of painful blisters without an obvious cause such as a thermal or chemical burn; appt made for 0900 tomorrow Dr Milinda Antis; Dr Dayton Martes had no availablility Protocol(s) Used: Skin Lesions Recommended Outcome per Protocol: See Provider within 24 hours Reason for Outcome: New onset of painful blisters without an obvious cause such as thermal or chemical burns Care Advice: ~ 07/05/2013 9:36:48AM Page 1 of 1 [id number]

## 2013-07-07 ENCOUNTER — Other Ambulatory Visit: Payer: Self-pay | Admitting: *Deleted

## 2013-07-07 MED ORDER — CLONAZEPAM 1 MG PO TABS: 1.0000 mg | ORAL_TABLET | Freq: Two times a day (BID) | ORAL | Status: DC | PRN

## 2013-07-07 NOTE — Telephone Encounter (Signed)
Last filled 05/31/13

## 2013-07-07 NOTE — Telephone Encounter (Signed)
Medication phoned to pharmacy.  

## 2013-07-29 ENCOUNTER — Encounter (HOSPITAL_COMMUNITY): Payer: Self-pay | Admitting: Adult Health

## 2013-07-29 ENCOUNTER — Emergency Department (HOSPITAL_COMMUNITY): Payer: Self-pay

## 2013-07-29 ENCOUNTER — Emergency Department (HOSPITAL_COMMUNITY)
Admission: EM | Admit: 2013-07-29 | Discharge: 2013-07-29 | Disposition: A | Discharge status: Home / Self Care | Payer: Self-pay | Attending: Emergency Medicine | Admitting: Emergency Medicine

## 2013-07-29 DIAGNOSIS — IMO0002 Reserved for concepts with insufficient information to code with codable children: Secondary | ICD-10-CM | POA: Insufficient documentation

## 2013-07-29 DIAGNOSIS — Z7982 Long term (current) use of aspirin: Secondary | ICD-10-CM | POA: Insufficient documentation

## 2013-07-29 DIAGNOSIS — S93401A Sprain of unspecified ligament of right ankle, initial encounter: Secondary | ICD-10-CM

## 2013-07-29 DIAGNOSIS — E119 Type 2 diabetes mellitus without complications: Secondary | ICD-10-CM | POA: Insufficient documentation

## 2013-07-29 DIAGNOSIS — Y929 Unspecified place or not applicable: Secondary | ICD-10-CM | POA: Insufficient documentation

## 2013-07-29 DIAGNOSIS — Y939 Activity, unspecified: Secondary | ICD-10-CM | POA: Insufficient documentation

## 2013-07-29 DIAGNOSIS — F411 Generalized anxiety disorder: Secondary | ICD-10-CM | POA: Insufficient documentation

## 2013-07-29 DIAGNOSIS — Z79899 Other long term (current) drug therapy: Secondary | ICD-10-CM | POA: Insufficient documentation

## 2013-07-29 DIAGNOSIS — S93409A Sprain of unspecified ligament of unspecified ankle, initial encounter: Secondary | ICD-10-CM | POA: Insufficient documentation

## 2013-07-29 DIAGNOSIS — F17200 Nicotine dependence, unspecified, uncomplicated: Secondary | ICD-10-CM | POA: Insufficient documentation

## 2013-07-29 HISTORY — DX: Type 2 diabetes mellitus without complications: E11.9

## 2013-07-29 MED ORDER — HYDROCODONE-ACETAMINOPHEN 5-325 MG PO TABS: 1.0000 | ORAL_TABLET | ORAL | Status: DC | PRN

## 2013-07-29 NOTE — Progress Notes (Signed)
Orthopedic Tech Progress Note Patient Details:  Ashley [name] October 24, 1966 [id number]  Ortho Devices Type of Ortho Device: CAM walker   Haskell Flirt 07/29/2013, 11:24 PM

## 2013-07-29 NOTE — ED Notes (Signed)
Presents with right foot injury 12 days ago, since reports right ankle pain shooting up into right leg. No deformity, no edema, CMS intact. Reports "my grandaughter sat really hard on my leg and it did twist too last night"

## 2013-07-29 NOTE — ED Provider Notes (Signed)
CSN: [id number]     Arrival date & time 07/29/13  2134 History     First MD Initiated Contact with Patient 07/29/13 2215     Chief Complaint  Patient presents with  . Foot Pain   (Consider location/radiation/quality/duration/timing/severity/associated sxs/prior Treatment) HPI History provided by pt.   Pt stubbed her right foot 9 days ago.  Pain and ecchymosis of middle toe has improved but 4 days later, she developed non-traumatic pain in right lateral ankle.  Aggravated by bearing weight and range of motion and associated w/ limited ROM of 1st-4th right toes.  Pain acutely worsened yesterday when her 45lb granddaughter fell on her ankle.  Denies fever, skin changes, edema, paresthesias.   Past Medical History  Diagnosis Date  . Anxiety   . Tobacco abuse   . Diabetes mellitus without complication    Past Surgical History  Procedure Laterality Date  . Appendectomy  1988   Family History  Problem Relation Age of Onset  . Diabetes Mother   . Hyperlipidemia Mother   . Hypertension Mother    History  Substance Use Topics  . Smoking status: Current Every Day Smoker  . Smokeless tobacco: Not on file  . Alcohol Use: No   OB History   Grav Para Term Preterm Abortions TAB SAB Ect Mult Living                 Review of Systems  All other systems reviewed and are negative.    Allergies  Penicillins  Home Medications   Current Outpatient Rx  Name  Route  Sig  Dispense  Refill  . albuterol (PROVENTIL HFA;VENTOLIN HFA) 108 (90 BASE) MCG/ACT inhaler   Inhalation   Inhale 2 puffs into the lungs every 6 (six) hours as needed for wheezing.         Marland Kitchen aspirin 325 MG tablet   Oral   Take 325 mg by mouth daily.         . clonazePAM (KLONOPIN) 1 MG tablet   Oral   Take 1 tablet (1 mg total) by mouth 2 (two) times daily as needed for anxiety.   60 tablet   1   . metFORMIN (GLUCOPHAGE) 500 MG tablet   Oral   Take 500 mg by mouth daily with breakfast.         .  promethazine (PHENERGAN) 25 MG tablet      Take 1 tab by mouth every 6 hours as needed for nausea   30 tablet   0   . traZODone (DESYREL) 150 MG tablet   Oral   Take 1 tablet (150 mg total) by mouth at bedtime.   30 tablet   11   . azithromycin (ZITHROMAX) 250 MG tablet      2 tabs by mouth on day 1, followed by 1 tab by mouth daily days 2 -5   6 tablet   0   . HYDROcodone-acetaminophen (NORCO/VICODIN) 5-325 MG per tablet   Oral   Take 1 tablet by mouth every 4 (four) hours as needed for pain.   15 tablet   0    BP 127/63  Pulse 65  Temp(Src) 98.4 F (36.9 C) (Oral)  Resp 17  SpO2 100% Physical Exam  Nursing note and vitals reviewed. Constitutional: She is oriented to person, place, and time. She appears well-developed and well-nourished. No distress.  HENT:  Head: Normocephalic and atraumatic.  Eyes:  Normal appearance  Neck: Normal range of motion.  Pulmonary/Chest: Effort  normal.  Musculoskeletal: Normal range of motion.  No deformity or skin changes of right ankle/foot.  Mild edema at lateral malleolus but same as left.  Tenderness at lateral tarsal bones, but less so when distracted.  Pain w/ minimal passive ROM of ankle.  When asked to move right toes she was unable, with exception of pinky, but actively flexed them when assessing ankle strength.  5/5 ankle flexion strength.  2+ DP pulse and distal sensation intact.    Neurological: She is alert and oriented to person, place, and time.  Psychiatric: She has a normal mood and affect. Her behavior is normal.    ED Course   Procedures (including critical care time)  Labs Reviewed - No data to display Dg Ankle Complete Right  07/29/2013   *RADIOLOGY REPORT*  Clinical Data: Ankle pain  RIGHT ANKLE - COMPLETE 3+ VIEW  Comparison: 07/15/2005  Findings: Normal alignment without fracture.  Intact malleoli, talus and calcaneus.  Stable exam.  IMPRESSION: No acute osseous finding   Original Report Authenticated By: Judie Petit.  Shick, M.D.   1. Sprain of right ankle, initial encounter     MDM  47yo F presents w/ right ankle pain w/ reported limited ROM of right toes.  Xray neg, no signs of septic arthritis and NV intact.  Pt able to actively flex right toes when distracted.  Ortho tech provided her w/ cam walker and pt d/c'd home w/ 15 vicodin.  Recommended low dose NSAID (diabetic), RICE and f/u with ortho.  Return precautions discussed.  11:58 PM   Otilio Miu, PA-C 07/30/13 0004

## 2013-07-30 NOTE — ED Provider Notes (Signed)
Medical screening examination/treatment/procedure(s) were performed by non-physician practitioner and as supervising physician I was immediately available for consultation/collaboration.  Juliet Rude. Rubin Payor, MD 07/30/13 2337

## 2013-09-08 ENCOUNTER — Other Ambulatory Visit: Payer: Self-pay | Admitting: *Deleted

## 2013-09-08 MED ORDER — CLONAZEPAM 1 MG PO TABS: 1.0000 mg | ORAL_TABLET | Freq: Two times a day (BID) | ORAL | Status: DC | PRN

## 2013-09-08 NOTE — Telephone Encounter (Signed)
Faxed refill request from [facility] [address], last filled 60 on 08/09/13.

## 2013-09-09 NOTE — Telephone Encounter (Signed)
Refill called to [facility]. 

## 2013-09-25 ENCOUNTER — Encounter: Payer: Self-pay | Admitting: Radiology

## 2013-09-28 ENCOUNTER — Ambulatory Visit (INDEPENDENT_AMBULATORY_CARE_PROVIDER_SITE_OTHER)
Admission: RE | Admit: 2013-09-28 | Discharge: 2013-09-28 | Disposition: A | Discharge status: Home / Self Care | Payer: No Typology Code available for payment source | Source: Ambulatory Visit | Attending: Family Medicine | Admitting: Family Medicine

## 2013-09-28 ENCOUNTER — Encounter: Payer: Self-pay | Admitting: Family Medicine

## 2013-09-28 ENCOUNTER — Ambulatory Visit (INDEPENDENT_AMBULATORY_CARE_PROVIDER_SITE_OTHER): Payer: No Typology Code available for payment source | Admitting: Family Medicine

## 2013-09-28 VITALS — BP 110/70 | HR 73 | Temp 98.2°F | Ht 62.5 in | Wt 129.8 lb

## 2013-09-28 DIAGNOSIS — M25579 Pain in unspecified ankle and joints of unspecified foot: Secondary | ICD-10-CM

## 2013-09-28 DIAGNOSIS — M79609 Pain in unspecified limb: Secondary | ICD-10-CM

## 2013-09-28 DIAGNOSIS — M25571 Pain in right ankle and joints of right foot: Secondary | ICD-10-CM

## 2013-09-28 DIAGNOSIS — G90521 Complex regional pain syndrome I of right lower limb: Secondary | ICD-10-CM

## 2013-09-28 DIAGNOSIS — G90529 Complex regional pain syndrome I of unspecified lower limb: Secondary | ICD-10-CM

## 2013-09-28 DIAGNOSIS — M79671 Pain in right foot: Secondary | ICD-10-CM

## 2013-09-28 MED ORDER — NAPROXEN 500 MG PO TABS: 500.0000 mg | ORAL_TABLET | Freq: Two times a day (BID) | ORAL | Status: DC

## 2013-09-28 MED ORDER — AMITRIPTYLINE HCL 10 MG PO TABS: 10.0000 mg | ORAL_TABLET | Freq: Every day | ORAL | Status: DC

## 2013-09-28 NOTE — Patient Instructions (Addendum)
F/u 1 month 

## 2013-09-28 NOTE — Progress Notes (Signed)
Nature conservation officer at [facility] [address] [place] [address] Phone: [phone] Fax: [phone]  Date:  09/28/2013   Name:  Ashley [name]   DOB:  1966-05-21   MRN:  [id number] Gender: female Age: 47 y.o.  Primary Physician:  Ruthe Mannan, MD   Chief Complaint: Follow-up   History of Present Illness:  Ashley [name] is a 47 y.o. pleasant patient who presents with the following:  DOI 07/29/2013  Ankle pain: Seen in ED on 07/29/2013, by their report ankle sprain, plain films reviewed which show no acute fracture at that time. Patient told to see f/u, but due to finances 2 months out from injury is her first eval. She has been wearing her R CAM boot for 2 months straight now.   Still wearing her CAM walker boot, will wear ASO occaisionally.   R lateral toes are numb. Toe is still painful, rolled ankle ad then could not move it. She has lost a lot of motion at the ankle now, and she still has pain at her toe on the right as well. She decribes a numbness or tingling distally. Initially there was swelling, bruising, which has receeded.  All hospital records reviewed and all films independently reviewed by myself at the time the patient in the exam room.  Patient Active Problem List   Diagnosis Date Noted  . Hyperglycemia 01/26/2013  . Dyspareunia 01/26/2013  . Routine general medical examination at a health care facility 05/24/2011  . MIGRAINE HEADACHE 05/30/2010  . ANXIETY 09/30/2009  . TOBACCO ABUSE 09/30/2009  . INSOMNIA, CHRONIC 09/30/2009  . ADJUSTMENT DISORDER WITH MIXED FEATURES 09/30/2009  . ELEVATED BLOOD PRESSURE WITHOUT DIAGNOSIS OF HYPERTENSION 09/30/2009    Past Medical History  Diagnosis Date  . Anxiety   . Tobacco abuse   . Diabetes mellitus without complication     Past Surgical History  Procedure Laterality Date  . Appendectomy  1988    History   Social History  . Marital Status: Married    Spouse Name: N/A    Number of Children: 5  . Years  of Education: N/A   Occupational History  . Printmaker     helps run a friends business   Social History Main Topics  . Smoking status: Current Every Day Smoker  . Smokeless tobacco: Never Used  . Alcohol Use: No  . Drug Use: No  . Sexual Activity: Not on file   Other Topics Concern  . Not on file   Social History Narrative   5 living children, one child is a crack cocaine addict who often breaks into their house to steal money. One child died of cerebral palsy.    Family History  Problem Relation Age of Onset  . Diabetes Mother   . Hyperlipidemia Mother   . Hypertension Mother     Allergies  Allergen Reactions  . Penicillins     REACTION: Throat swells    Medication list has been reviewed and updated.  Outpatient Prescriptions Prior to Visit  Medication Sig Dispense Refill  . clonazePAM (KLONOPIN) 1 MG tablet Take 1 tablet (1 mg total) by mouth 2 (two) times daily as needed for anxiety.  60 tablet  0  . metFORMIN (GLUCOPHAGE) 500 MG tablet Take 500 mg by mouth daily with breakfast.      . promethazine (PHENERGAN) 25 MG tablet Take 1 tab by mouth every 6 hours as needed for nausea  30 tablet  0  . traZODone (  DESYREL) 150 MG tablet Take 1 tablet (150 mg total) by mouth at bedtime.  30 tablet  11  . albuterol (PROVENTIL HFA;VENTOLIN HFA) 108 (90 BASE) MCG/ACT inhaler Inhale 2 puffs into the lungs every 6 (six) hours as needed for wheezing.      Marland Kitchen aspirin 325 MG tablet Take 325 mg by mouth daily.      Marland Kitchen azithromycin (ZITHROMAX) 250 MG tablet 2 tabs by mouth on day 1, followed by 1 tab by mouth daily days 2 -5  6 tablet  0  . HYDROcodone-acetaminophen (NORCO/VICODIN) 5-325 MG per tablet Take 1 tablet by mouth every 4 (four) hours as needed for pain.  15 tablet  0   No facility-administered medications prior to visit.    Review of Systems:   GEN: No fevers, chills. Nontoxic. Primarily MSK c/o today. MSK: Detailed in the HPI GI: tolerating PO intake without  difficulty Neuro:  As above Otherwise, the pertinent positives and negatives are listed above and in the HPI, otherwise a full review of systems has been reviewed and is negative unless noted positive.   Physical Examination: BP 110/70  Pulse 73  Temp(Src) 98.2 F (36.8 C) (Oral)  Ht 5' 2.5" (1.588 m)  Wt 129 lb 12 oz (58.854 kg)  BMI 23.34 kg/m2  LMP 09/09/2013  Ideal Body Weight: Weight in (lb) to have BMI = 25: 138.6   GEN: Well-developed,well-nourished,in no acute distress; alert,appropriate and cooperative throughout examination HEENT: Normocephalic and atraumatic without obvious abnormalities. Ears, externally no deformities PULM: Breathing comfortably in no respiratory distress EXT: No clubbing, cyanosis, or edema PSYCH: Normally interactive. Cooperative during the interview. Pleasant. Friendly and conversant. Not anxious or depressed appearing. Normal, full affect.  ANKLE: R Echymosis: no Edema: no ROM: quite limited in all directions at ankle Gait: heel toe, notably antalgic Lateral Mall: NT Medial Mall: NT Talus: mild tenderness Navicular: NT Cuboid: NT Calcaneous: NT Metatarsals: NT 5th MT: NT Phalanges: NT Achilles: NT Plantar Fascia: NT Fat Pad: NT Peroneals: NT Post Tib: NT Great Toe: Nml motion Ant Drawer: neg Talar Tilt: neg ATFL: NT CFL: NT Deltoid: NT Str: limited by pain, all at least 4/5 Other Special tests: none Sensation: intact   Dg Ankle Complete Right  09/28/2013   CLINICAL DATA:  Persistent right ankle pain  EXAM: RIGHT ANKLE - COMPLETE 3+ VIEW  COMPARISON:  07/29/2013  FINDINGS: There is no evidence of fracture, dislocation, or joint effusion. There is no evidence of arthropathy or other focal bone abnormality. Soft tissues are unremarkable. Ankle mortise is preserved.  IMPRESSION: Negative.  No significant change.   Electronically Signed   By: Natasha Mead M.D.   On: 09/28/2013 12:18   Dg Foot Complete Right  09/28/2013   CLINICAL  DATA:  Right foot pain post trauma 2 months ago  EXAM: RIGHT FOOT COMPLETE - 3+ VIEW  COMPARISON:  None.  FINDINGS: There is no evidence of fracture or dislocation. There is no evidence of arthropathy or other focal bone abnormality. Soft tissues are unremarkable.  IMPRESSION: Negative.   Electronically Signed   By: Natasha Mead M.D.   On: 09/28/2013 12:41    Assessment and Plan:  Right ankle pain - Plan: DG Ankle Complete Right, Ambulatory referral to Physical Therapy  Right foot pain - Plan: DG Foot Complete Right, Ambulatory referral to Physical Therapy  Reflex sympathetic dystrophy of lower extremity, right - Plan: Ambulatory referral to Physical Therapy  I don't see any evidence of old missed fracture.  I suspect most of this is from being immobilized for 2 months straight, loss of motion, wasting of muscle. Her neuropathic symptoms most resemble RSD or CRPS, so I am going to start her on Elavil at night in addition to high dose naprosyn. Much worse than expected 2 months post ankle sprain.  Start PT, and I will follow her over time.  Orders Today:  Orders Placed This Encounter  Procedures  . DG Ankle Complete Right    Standing Status: Future     Number of Occurrences: 1     Standing Expiration Date: 11/28/2014    Order Specific Question:  Preferred imaging location?    Answer:  [facility]    Order Specific Question:  Reason for exam:    Answer:  trauma 07/29/2013, continued significant pain  . DG Foot Complete Right    Standing Status: Future     Number of Occurrences: 1     Standing Expiration Date: 11/28/2014    Order Specific Question:  Preferred imaging location?    Answer:  [facility]    Order Specific Question:  Reason for exam:    Answer:  trauma, 2 mo ago, cont pain  . Ambulatory referral to Physical Therapy    Referral Priority:  Routine    Referral Type:  Physical Medicine    Referral Reason:  Specialty Services Required    Requested Specialty:   Physical Therapy    Number of Visits Requested:  1    Updated Medication List: (Includes new medications, updates to list, dose adjustments) Meds ordered this encounter  Medications  . amitriptyline (ELAVIL) 10 MG tablet    Sig: Take 1 tablet (10 mg total) by mouth at bedtime.    Dispense:  30 tablet    Refill:  3  . naproxen (NAPROSYN) 500 MG tablet    Sig: Take 1 tablet (500 mg total) by mouth 2 (two) times daily with a meal.    Dispense:  60 tablet    Refill:  2    Medications Discontinued: Medications Discontinued During This Encounter  Medication Reason  . azithromycin (ZITHROMAX) 250 MG tablet Completed Course  . HYDROcodone-acetaminophen (NORCO/VICODIN) 5-325 MG per tablet One time medication      Signed,  Adanna Zuckerman T. Shereen Marton, MD

## 2013-09-29 ENCOUNTER — Ambulatory Visit: Payer: Self-pay | Admitting: Family Medicine

## 2013-09-29 ENCOUNTER — Ambulatory Visit (INDEPENDENT_AMBULATORY_CARE_PROVIDER_SITE_OTHER): Payer: No Typology Code available for payment source | Admitting: Family Medicine

## 2013-09-29 ENCOUNTER — Encounter: Payer: Self-pay | Admitting: Family Medicine

## 2013-09-29 VITALS — BP 110/70 | HR 67 | Temp 98.6°F | Wt 127.0 lb

## 2013-09-29 DIAGNOSIS — F411 Generalized anxiety disorder: Secondary | ICD-10-CM

## 2013-09-29 DIAGNOSIS — R7309 Other abnormal glucose: Secondary | ICD-10-CM

## 2013-09-29 DIAGNOSIS — R739 Hyperglycemia, unspecified: Secondary | ICD-10-CM

## 2013-09-29 LAB — COMPREHENSIVE METABOLIC PANEL
ALT: 9 U/L (ref 0–35)
Albumin: 4.2 g/dL (ref 3.5–5.2)
BUN: 14 mg/dL (ref 6–23)
CO2: 27 mEq/L (ref 19–32)
Calcium: 9.2 mg/dL (ref 8.4–10.5)
Chloride: 102 mEq/L (ref 96–112)
Creatinine, Ser: 0.7 mg/dL (ref 0.4–1.2)
GFR: 93.5 mL/min (ref >60.00)

## 2013-09-29 MED ORDER — PROMETHAZINE HCL 25 MG PO TABS: ORAL_TABLET | ORAL | Status: DC

## 2013-09-29 NOTE — Patient Instructions (Addendum)
Good to see you. PLEASE STOP taking Asprin.  We will check some blood work today.  Please eat small meals throughout the day.

## 2013-09-29 NOTE — Progress Notes (Signed)
47 here to discuss medication.    Anxiety/insomnia-  Had been stable on Trazadone and klonipin.  Increased stressors at home- several children and grandchildren now living with her.   Both insomnia and anxiety have worsened.    Started taking Elavil last night (Dr. Patsy Lager) for her ankle/foot neuropathy and pain.   Hyperglycemia- on Metformin 500 mg daily with breakfast. Lab Results  Component Value Date   HGBA1C 5.3 04/27/2013   At times, fasting CBG elevated in high 100s.  She also does not eat anything between a glucerna shake in the am and dinner because she is so busy and "stressed with family" that she forgets to eat. Can get shaky when she does not eat.  Patient Active Problem List   Diagnosis Date Noted  . Hyperglycemia 01/26/2013  . Dyspareunia 01/26/2013  . Routine general medical examination at a health care facility 05/24/2011  . MIGRAINE HEADACHE 05/30/2010  . ANXIETY 09/30/2009  . TOBACCO ABUSE 09/30/2009  . INSOMNIA, CHRONIC 09/30/2009  . ADJUSTMENT DISORDER WITH MIXED FEATURES 09/30/2009  . ELEVATED BLOOD PRESSURE WITHOUT DIAGNOSIS OF HYPERTENSION 09/30/2009   Past Medical History  Diagnosis Date  . Anxiety   . Tobacco abuse   . Diabetes mellitus without complication    Past Surgical History  Procedure Laterality Date  . Appendectomy  1988   History  Substance Use Topics  . Smoking status: Current Every Day Smoker  . Smokeless tobacco: Never Used  . Alcohol Use: No   Family History  Problem Relation Age of Onset  . Diabetes Mother   . Hyperlipidemia Mother   . Hypertension Mother    Allergies  Allergen Reactions  . Penicillins     REACTION: Throat swells   Current Outpatient Prescriptions on File Prior to Visit  Medication Sig Dispense Refill  . albuterol (PROVENTIL HFA;VENTOLIN HFA) 108 (90 BASE) MCG/ACT inhaler Inhale 2 puffs into the lungs every 6 (six) hours as needed for wheezing.      Marland Kitchen amitriptyline (ELAVIL) 10 MG tablet Take 1 tablet  (10 mg total) by mouth at bedtime.  30 tablet  3  . clonazePAM (KLONOPIN) 1 MG tablet Take 1 tablet (1 mg total) by mouth 2 (two) times daily as needed for anxiety.  60 tablet  0  . metFORMIN (GLUCOPHAGE) 500 MG tablet Take 500 mg by mouth daily with breakfast.      . naproxen (NAPROSYN) 500 MG tablet Take 1 tablet (500 mg total) by mouth 2 (two) times daily with a meal.  60 tablet  2  . promethazine (PHENERGAN) 25 MG tablet Take 1 tab by mouth every 6 hours as needed for nausea  30 tablet  0  . traZODone (DESYREL) 150 MG tablet Take 1 tablet (150 mg total) by mouth at bedtime.  30 tablet  11   No current facility-administered medications on file prior to visit.     The PMH, PSH, Social History, Family History, Medications, and allergies have been reviewed in [facility], and have been updated if relevant.   Review of Systems       See HPI  Physical Exam BP 110/70  Pulse 67  Temp(Src) 98.6 F (37 C) (Oral)  Wt 127 lb (57.607 kg)  BMI 22.84 kg/m2  SpO2 97%  LMP 09/09/2013 General:  underweight appearing, alert, pleasant.   Psych:  Cognition and judgment appear intact. Alert and cooperative with normal attention span and concentration. No apparent delusions, illusions, hallucinations  Assessment and Plan:  1. Hyperglycemia Recheck a1c.  Discussed importance of eating small meals throughout the day. The patient indicates understanding of these issues and agrees with the plan.  - Hemoglobin A1c - Comprehensive metabolic panel  2. ANXIETY >25 min spent with face to face with patient, >50% counseling and/or coordinating care Deteriorated likely due to increased stressors in her life.  We have discussed SSRIs in past- she has been hesitant.  Just started Elavil last night- which can help with insomnia and anxiety so I advised no change in medication currently and we can reassess this is a few weeks. The patient indicates understanding of these issues and agrees with the plan.

## 2013-09-30 ENCOUNTER — Telehealth: Payer: Self-pay

## 2013-09-30 NOTE — Telephone Encounter (Signed)
Pt left v/m; pt was seen 09/29/13 and pt forgot to ask pt about starting Chantix. [facility].Please advise. Would pt need to schedule appt to discuss?

## 2013-10-01 MED ORDER — VARENICLINE TARTRATE 0.5 MG X 11 & 1 MG X 42 PO MISC: ORAL | Status: DC

## 2013-10-01 NOTE — Telephone Encounter (Signed)
rx faxed to pharmacy manually Left message on VM for pt with results, advised her to call if any side effects

## 2013-10-01 NOTE — Telephone Encounter (Signed)
Rx for starter Chantix pack sent to her pharmacy.   Please keep [place] updated and make sure to look at side effects on medication handout from pharmacy- most commonly vivid dreams and GI complaints.  May cause suicidal thoughts as well.

## 2013-10-09 ENCOUNTER — Ambulatory Visit (INDEPENDENT_AMBULATORY_CARE_PROVIDER_SITE_OTHER): Payer: No Typology Code available for payment source | Admitting: Family Medicine

## 2013-10-09 ENCOUNTER — Telehealth: Payer: Self-pay | Admitting: Family Medicine

## 2013-10-09 VITALS — BP 90/50 | HR 61 | Temp 98.6°F | Ht 62.5 in | Wt 129.5 lb

## 2013-10-09 DIAGNOSIS — G43919 Migraine, unspecified, intractable, without status migrainosus: Secondary | ICD-10-CM

## 2013-10-09 DIAGNOSIS — G43909 Migraine, unspecified, not intractable, without status migrainosus: Secondary | ICD-10-CM

## 2013-10-09 MED ORDER — KETOROLAC TROMETHAMINE 60 MG/2ML IM SOLN
60.0000 mg | Freq: Once | INTRAMUSCULAR | Status: AC
  Administered 2013-10-09: 60 mg via INTRAMUSCULAR

## 2013-10-09 MED ORDER — PROMETHAZINE HCL 25 MG/ML IJ SOLN
25.0000 mg | Freq: Once | INTRAMUSCULAR | Status: AC
  Administered 2013-10-09: 25 mg via INTRAMUSCULAR

## 2013-10-09 NOTE — Assessment & Plan Note (Signed)
Treat with toradol and phenergan IM.  Push fluids, rest.

## 2013-10-09 NOTE — Patient Instructions (Signed)
Push fluids, rest, call if not improving as expected.

## 2013-10-09 NOTE — Progress Notes (Signed)
  Subjective:    Patient ID: Ashley [name], female    DOB: January 12, 1966, 47 y.o.   MRN: [id number]  HPI  47 year old female pt of Dr. Dayton Martes presents for migraine headache . Onset of headache 5 days ago. Associated with nausea, right temporal pain, 10/10, photophobia.  She uses phenergan to treat acute migraine... She has used 2 tabs a day. This has not helped.  No fever, no ear pain, no numbness, no weakness, no slurred speech. No confusion. No LOC.  Strong family history of migraine.   Review of Systems  Constitutional: Negative for fever and fatigue.  HENT: Negative for ear pain.   Eyes: Negative for pain.  Respiratory: Negative for chest tightness and shortness of breath.   Cardiovascular: Negative for chest pain, palpitations and leg swelling.  Gastrointestinal: Negative for abdominal pain.  Genitourinary: Negative for dysuria.       Objective:   Physical Exam  Constitutional: She is oriented to person, place, and time. Vital signs are normal. She appears well-developed and well-nourished. She is cooperative.  Non-toxic appearance. She does not appear ill. No distress.  HENT:  Head: Normocephalic.  Right Ear: Hearing, tympanic membrane, external ear and ear canal normal. Tympanic membrane is not erythematous, not retracted and not bulging.  Left Ear: Hearing, tympanic membrane, external ear and ear canal normal. Tympanic membrane is not erythematous, not retracted and not bulging.  Nose: No mucosal edema or rhinorrhea. Right sinus exhibits no maxillary sinus tenderness and no frontal sinus tenderness. Left sinus exhibits no maxillary sinus tenderness and no frontal sinus tenderness.  Mouth/Throat: Uvula is midline, oropharynx is clear and moist and mucous membranes are normal.  Eyes: Conjunctivae, EOM and lids are normal. Pupils are equal, round, and reactive to light. Lids are everted and swept, no foreign bodies found.  Neck: Trachea normal and normal range of motion. Neck  supple. Carotid bruit is not present. No mass and no thyromegaly present.  Cardiovascular: Normal rate, regular rhythm, S1 normal, S2 normal, normal heart sounds, intact distal pulses and normal pulses.  Exam reveals no gallop and no friction rub.   No murmur heard. Pulmonary/Chest: Effort normal and breath sounds normal. Not tachypneic. No respiratory distress. She has no decreased breath sounds. She has no wheezes. She has no rhonchi. She has no rales.  Abdominal: Soft. Normal appearance and bowel sounds are normal. There is no tenderness.  Neurological: She is alert and oriented to person, place, and time. She has normal strength and normal reflexes. No sensory deficit. She displays a negative Romberg sign. Gait normal.  No papiledema  Skin: Skin is warm, dry and intact. No rash noted.  Psychiatric: Her speech is normal and behavior is normal. Judgment and thought content normal. Her mood appears not anxious. Cognition and memory are normal. She does not exhibit a depressed mood.          Assessment & Plan:

## 2013-10-09 NOTE — Telephone Encounter (Signed)
Pt advised to go to ER.  Please call to check on pt on Monday.

## 2013-10-09 NOTE — Telephone Encounter (Signed)
Pt called and spoke with Ricki Miller at front desk stating that she was told by [facility] that Dr. Dayton Martes would call her in 10-15 minutes after getting off of the phone with them.  [name] reinforced RN disposition to pt and encouraged pt to go to ED.

## 2013-10-09 NOTE — Telephone Encounter (Signed)
Patient Information:  Caller Name: Avaleigh  Phone: [phone]  Patient: Ashley [name], Ashley [name]  Gender: Female  DOB: May 20, 1966  Age: 47 Years  PCP: Ruthe Mannan [facility])  Pregnant: No  Office Follow Up:  Does the office need to follow up with this patient?: Yes  Instructions For The Office: ED Disposition for Headach all week- pain pressure R eye, blurry vision on and off   Symptoms  Reason For Call & Symptoms: Calling about waking up with headache and nausea on 10/05/13 and it has continued dispite home care.  Stopped Metformin one week ago after A1C level came back normal. Taking Phenergan daily and helped some with the nausea but not with H/A. 800 mgs of Ibuprofen did not help. R eye hurting, sensitive to light. and vision blurry on and off. Afebrile. No other sx. BG=120 this morning before breakfast-10/09/13.   Reviewed Health History In EMR: Yes  Reviewed Medications In EMR: Yes  Reviewed Allergies In EMR: Yes  Reviewed Surgeries / Procedures: Yes  Date of Onset of Symptoms: 10/05/2013  Treatments Tried: Phengergan, Naprosin, Motrin 800 mgs  Treatments Tried Worked: No OB / GYN:  LMP: 09/09/2013  Guideline(s) Used:  Headache  Disposition Per Guideline:   Go to ED Now (or to Office with PCP Approval)  Reason For Disposition Reached:   Severe pain in one eye  Advice Given:  Migraine Medication:   If your doctor has prescribed specific medication for your migraine, take it as directed as soon as the migraine starts.  Rest:   Lie down in a dark, quiet place and try to relax. Close your eyes and imagine your entire body relaxing.  Apply Cold to the Area:   Apply a cold wet washcloth or cold pack to the forehead for 20 minutes.  Stretching:   Stretch and massage any tight neck muscles.  Call Back If:  Headache lasts longer than 24 hours  You become worse.  Patient Will Follow Care Advice:  YES

## 2013-10-13 ENCOUNTER — Other Ambulatory Visit: Payer: Self-pay

## 2013-10-13 MED ORDER — CLONAZEPAM 1 MG PO TABS: 1.0000 mg | ORAL_TABLET | Freq: Two times a day (BID) | ORAL | Status: DC | PRN

## 2013-10-13 NOTE — Telephone Encounter (Signed)
Phoned in to pharmacy. 

## 2013-10-13 NOTE — Telephone Encounter (Signed)
Last office visit 10/09/13. Is it okay to refill medication?

## 2013-10-13 NOTE — Telephone Encounter (Signed)
Pt request refill clonazepam to [facility] [place] [facility].Please advise.  

## 2013-10-26 ENCOUNTER — Other Ambulatory Visit: Payer: Self-pay | Admitting: *Deleted

## 2013-10-26 MED ORDER — VARENICLINE TARTRATE 1 MG PO TABS: 1.0000 mg | ORAL_TABLET | Freq: Two times a day (BID) | ORAL | Status: DC

## 2013-10-27 ENCOUNTER — Encounter: Payer: Self-pay | Admitting: Radiology

## 2013-10-28 ENCOUNTER — Encounter: Payer: No Typology Code available for payment source | Admitting: Family Medicine

## 2013-10-28 DIAGNOSIS — Z0289 Encounter for other administrative examinations: Secondary | ICD-10-CM

## 2013-10-28 NOTE — Progress Notes (Signed)
Pre-visit discussion using our clinic review tool. No additional management support is needed unless otherwise documented below in the visit note.  

## 2013-10-29 NOTE — Progress Notes (Signed)
This encounter was created in error - please disregard.

## 2013-11-11 ENCOUNTER — Emergency Department (HOSPITAL_COMMUNITY)
Admission: EM | Admit: 2013-11-11 | Discharge: 2013-11-12 | Disposition: A | Discharge status: Home / Self Care | Payer: No Typology Code available for payment source | Attending: Emergency Medicine | Admitting: Emergency Medicine

## 2013-11-11 DIAGNOSIS — F17200 Nicotine dependence, unspecified, uncomplicated: Secondary | ICD-10-CM | POA: Insufficient documentation

## 2013-11-11 DIAGNOSIS — H538 Other visual disturbances: Secondary | ICD-10-CM | POA: Insufficient documentation

## 2013-11-11 DIAGNOSIS — Z79899 Other long term (current) drug therapy: Secondary | ICD-10-CM | POA: Insufficient documentation

## 2013-11-11 DIAGNOSIS — R61 Generalized hyperhidrosis: Secondary | ICD-10-CM | POA: Insufficient documentation

## 2013-11-11 DIAGNOSIS — E119 Type 2 diabetes mellitus without complications: Secondary | ICD-10-CM | POA: Insufficient documentation

## 2013-11-11 DIAGNOSIS — R238 Other skin changes: Secondary | ICD-10-CM | POA: Insufficient documentation

## 2013-11-11 DIAGNOSIS — R42 Dizziness and giddiness: Secondary | ICD-10-CM | POA: Insufficient documentation

## 2013-11-11 DIAGNOSIS — F411 Generalized anxiety disorder: Secondary | ICD-10-CM | POA: Insufficient documentation

## 2013-11-11 DIAGNOSIS — Z880 Allergy status to penicillin: Secondary | ICD-10-CM | POA: Insufficient documentation

## 2013-11-11 LAB — COMPREHENSIVE METABOLIC PANEL
AST: 10 U/L (ref 0–37)
Albumin: 3.6 g/dL (ref 3.5–5.2)
BUN: 8 mg/dL (ref 6–23)
Calcium: 8.5 mg/dL (ref 8.4–10.5)
Chloride: 102 mEq/L (ref 96–112)
Creatinine, Ser: 0.76 mg/dL (ref 0.50–1.10)
Total Protein: 6.7 g/dL (ref 6.0–8.3)

## 2013-11-11 LAB — GLUCOSE, CAPILLARY

## 2013-11-11 LAB — CBC WITH DIFFERENTIAL/PLATELET
Basophils Absolute: 0 10*3/uL (ref 0.0–0.1)
Basophils Relative: 0 % (ref 0–1)
Eosinophils Absolute: 0.4 10*3/uL (ref 0.0–0.7)
HCT: 33.6 % — ABNORMAL LOW (ref 36.0–46.0)
MCH: 33.2 pg (ref 26.0–34.0)
MCHC: 35.1 g/dL (ref 30.0–36.0)
Monocytes Absolute: 0.9 10*3/uL (ref 0.1–1.0)
Neutro Abs: 5.1 10*3/uL (ref 1.7–7.7)
RDW: 14.3 % (ref 11.5–15.5)

## 2013-11-11 NOTE — ED Notes (Signed)
Pt. reports elevated blood sugar  today 235 using glucometer at home , light headed /clammy / blurred vision . Alert and oriented , respirations unlabored , denies pain .

## 2013-11-12 ENCOUNTER — Telehealth: Payer: Self-pay | Admitting: Family Medicine

## 2013-11-12 LAB — URINALYSIS, ROUTINE W REFLEX MICROSCOPIC
Glucose, UA: NEGATIVE mg/dL
Leukocytes, UA: NEGATIVE
Specific Gravity, Urine: 1.005 (ref 1.005–1.030)
pH: 6 (ref 5.0–8.0)

## 2013-11-12 LAB — URINE MICROSCOPIC-ADD ON

## 2013-11-12 NOTE — Telephone Encounter (Signed)
Pt needs to be seen if she hasn't already

## 2013-11-12 NOTE — Telephone Encounter (Signed)
Pt was seen in ED last night.  She does not have transportation to come to office today.  Appt scheduled for tomorrow with Dr. Ermalene Searing.

## 2013-11-12 NOTE — ED Notes (Signed)
The patient complained of dizziness and light-headed(patient seen lights) and the patients gait was unbalanced; during orthostatic (standing).

## 2013-11-12 NOTE — ED Provider Notes (Signed)
CSN: [id number]     Arrival date & time 11/11/13  2116 History   First MD Initiated Contact with Patient 11/12/13 0012     Chief Complaint  Patient presents with  . Dizziness  . Hyperglycemia   (Consider location/radiation/quality/duration/timing/severity/associated sxs/prior Treatment) HPI Patient was recently taken off of her metformin and started on Chantix takes by her primary MD last month. She states that the last few days her blood sugars been running high state that up to 235. She's been having his associated lightheadedness and clamminess with blurred vision. She denies any fevers or chills. She denies any focal visual loss. She denies any focal weakness or numbness. She has no headache or neck pain. Denies any chest pain or shortness of breath. No abdominal pain, nausea, vomiting or diarrhea. She does admit that to polydipsia and polyuria. She denies any dysuria. She states she currently does have mild lightheadedness especially when she stands or sits up. She denies vertiginous symptoms. Past Medical History  Diagnosis Date  . Anxiety   . Tobacco abuse   . Diabetes mellitus without complication    Past Surgical History  Procedure Laterality Date  . Appendectomy  1988   Family History  Problem Relation Age of Onset  . Diabetes Mother   . Hyperlipidemia Mother   . Hypertension Mother    History  Substance Use Topics  . Smoking status: Current Every Day Smoker  . Smokeless tobacco: Never Used  . Alcohol Use: No   OB History   Grav Para Term Preterm Abortions TAB SAB Ect Mult Living                 Review of Systems  Constitutional: Positive for diaphoresis. Negative for fever, chills and fatigue.  Respiratory: Negative for cough and shortness of breath.   Cardiovascular: Negative for chest pain and palpitations.  Gastrointestinal: Negative for nausea, vomiting, abdominal pain and diarrhea.  Endocrine: Positive for polydipsia and polyuria.  Genitourinary: Positive  for frequency. Negative for dysuria, hematuria, flank pain and difficulty urinating.  Musculoskeletal: Negative for back pain, myalgias, neck pain and neck stiffness.  Skin: Negative for rash and wound.  Neurological: Positive for dizziness and light-headedness. Negative for syncope, weakness, numbness and headaches.  All other systems reviewed and are negative.    Allergies  Penicillins  Home Medications   Current Outpatient Rx  Name  Route  Sig  Dispense  Refill  . clonazePAM (KLONOPIN) 1 MG tablet   Oral   Take 1 mg by mouth 2 (two) times daily.         . Estradiol 10 MCG TABS vaginal tablet   Vaginal   Place 1 tablet vaginally See admin instructions. Takes Tuesday and Friday at bedtime         . norethindrone (ERRIN) 0.35 MG tablet   Oral   Take 1 tablet by mouth daily.         . promethazine (PHENERGAN) 25 MG tablet      Take 1 tab by mouth every 6 hours as needed for nausea   30 tablet   0   . traZODone (DESYREL) 150 MG tablet   Oral   Take 1 tablet (150 mg total) by mouth at bedtime.   30 tablet   11   . varenicline (CHANTIX CONTINUING MONTH PAK) 1 MG tablet   Oral   Take 1 tablet (1 mg total) by mouth 2 (two) times daily.   60 tablet   2   .  albuterol (PROVENTIL HFA;VENTOLIN HFA) 108 (90 BASE) MCG/ACT inhaler   Inhalation   Inhale 2 puffs into the lungs every 6 (six) hours as needed for wheezing.         . varenicline (CHANTIX STARTING MONTH PAK) 0.5 MG X 11 & 1 MG X 42 tablet      Take one 0.5 mg tablet by mouth once daily for 3 days, then increase to one 0.5 mg tablet twice daily for 4 days, then increase to one 1 mg tablet twice daily.   53 tablet   0    BP 119/57  Pulse 67  Temp(Src) 98.1 F (36.7 C) (Oral)  Resp 22  SpO2 100%  LMP 11/03/2013 Physical Exam  Nursing note and vitals reviewed. Constitutional: She is oriented to person, place, and time. She appears well-developed and well-nourished. No distress.  HENT:  Head:  Normocephalic and atraumatic.  Mouth/Throat: Oropharynx is clear and moist. No oropharyngeal exudate.  Eyes: EOM are normal. Pupils are equal, round, and reactive to light.  No nystagmus  Neck: Normal range of motion. Neck supple.  Cardiovascular: Normal rate and regular rhythm.   Pulmonary/Chest: Effort normal and breath sounds normal. No respiratory distress. She has no wheezes. She has no rales. She exhibits no tenderness.  Abdominal: Soft. Bowel sounds are normal. She exhibits no distension and no mass. There is no tenderness. There is no rebound and no guarding.  Musculoskeletal: Normal range of motion. She exhibits no edema and no tenderness.  No calf swelling or tenderness.  Neurological: She is alert and oriented to person, place, and time.  Patient is alert and oriented x3 with clear, goal oriented speech. Patient has 5/5 motor in all extremities. Sensation is intact to light touch. Bilateral finger-to-nose is normal with no signs of dysmetria. Patient has a normal gait and walks without assistance.   Skin: Skin is warm and dry. No rash noted. No erythema.  Psychiatric: She has a normal mood and affect. Her behavior is normal.    ED Course  Procedures (including critical care time) Labs Review Labs Reviewed  GLUCOSE, CAPILLARY - Abnormal; Notable for the following:    Glucose-Capillary 115 (*)    All other components within normal limits  CBC WITH DIFFERENTIAL - Abnormal; Notable for the following:    RBC 3.55 (*)    Hemoglobin 11.8 (*)    HCT 33.6 (*)    All other components within normal limits  COMPREHENSIVE METABOLIC PANEL - Abnormal; Notable for the following:    Glucose, Bld 114 (*)    All other components within normal limits  URINALYSIS, ROUTINE W REFLEX MICROSCOPIC   Imaging Review No results found.  EKG Interpretation   None       Date: 11/12/2013  Rate: 62  Rhythm: normal sinus rhythm  QRS Axis: normal  Intervals: normal  ST/T Wave abnormalities:  normal  Conduction Disutrbances:none  Narrative Interpretation:   Old EKG Reviewed: unchanged    MDM  Patient's symptoms have improved in emergency department. I advised patient to drink plenty of fluids and to follow with her primary Dr. in the next 24-48 hours. I also told her that she's changed positions slowly as if she has some postural dizziness likely due to mild dehydration. She's been given return precautions and voiced understanding.    Loren Racer, MD 11/12/13 [phone]

## 2013-11-12 NOTE — Telephone Encounter (Signed)
[facility] Triage Call Report Triage Record Num: [id number] Operator: Rene Kocher Patient Name: Ashley [name] Call Date & Time: 11/11/2013 8:30:07PM Patient Phone: [phone] PCP: Ashley [name] Patient Gender: Female PCP Fax : [phone] Patient DOB: August 14, 1966 Practice Name: [facility] Reason for Call: Caller: Ashley [name]/Patient; PCP: Ashley [name] (Family Practice); CB#: [phone]; Call regarding Was told to go off metformin, States that she feels "weird" and BS is 235; 11/11/13 patient calling due to increased blood sugar levels. Woke up later than usual due to feeling very tired. Patient also states she is sweating, lightheaded, and blurred vision. She also states she has been feeling really tired but when she tried to sleep she is unable too. Blood sugar readings today: 1200-155, 1630-165, 1830 ate dinner and at 1930-235. Had patient check it again at 2030 and it is 183. Emergent sx 'New or increasing symptoms or glucose out of control as defined by provider or action plan and taking medications/following treatment plan'. Patient was taking Metformin but was instructed to stop in Oct 2014. She normally checks her blood sugar twice weekly. Today's symptoms are new and unusual for her. Advised to go to the ED. She will have someone driver her to [facility]. ' Protocol(s) Used: Diabetes: Control Problems Recommended Outcome per Protocol: See Provider within 4 hours Reason for Outcome: New or increasing symptoms or glucose out of control as defined by provider or action plan AND taking medications/following therapy as prescribed Care Advice: ~ Another adult should drive. ~ SYMPTOM / CONDITION MANAGEMENT Medication Advice: - Tell provider all prescription, nonprescription or alternative medications that you take. - Know possible side effects of medication and what to do If they occur. - Discontinue all nonprescription and alternative medications, especially stimulants, until  evaluated by provider. ~ High Blood Sugar Treatment: - Follow action plan. - Adjust medications if instructed to do so in action plan or by provider. - Test blood sugar and ketones more often. - Record blood sugar and ketones in meter log or separate logbook, and take to all provider visits. - Drink extra water and other non-sugar fluids to prevent dehydration when the blood sugar is high and urine ketones are present. ~ 11/11/2013 8:45:52PM Page 1 of 1 [id number]

## 2013-11-13 ENCOUNTER — Ambulatory Visit (INDEPENDENT_AMBULATORY_CARE_PROVIDER_SITE_OTHER): Payer: No Typology Code available for payment source | Admitting: Family Medicine

## 2013-11-13 ENCOUNTER — Encounter: Payer: Self-pay | Admitting: Family Medicine

## 2013-11-13 VITALS — BP 110/62 | HR 67 | Temp 97.8°F | Ht 62.5 in | Wt 138.5 lb

## 2013-11-13 DIAGNOSIS — F411 Generalized anxiety disorder: Secondary | ICD-10-CM

## 2013-11-13 DIAGNOSIS — R739 Hyperglycemia, unspecified: Secondary | ICD-10-CM

## 2013-11-13 DIAGNOSIS — R42 Dizziness and giddiness: Secondary | ICD-10-CM

## 2013-11-13 DIAGNOSIS — R7309 Other abnormal glucose: Secondary | ICD-10-CM

## 2013-11-13 MED ORDER — CLONAZEPAM 1 MG PO TABS: 1.0000 mg | ORAL_TABLET | Freq: Two times a day (BID) | ORAL | Status: DC

## 2013-11-13 NOTE — Progress Notes (Signed)
Pre-visit discussion using our clinic review tool. No additional management support is needed unless otherwise documented below in the visit note.  

## 2013-11-13 NOTE — Assessment & Plan Note (Signed)
Labs and ER workup reviewed. Likely secondary to blood sugar fluctuations.  No further eval needed.

## 2013-11-13 NOTE — Patient Instructions (Signed)
Restart metformin previous dose.  Keep follow up with PCP in 12/2012.

## 2013-11-13 NOTE — Progress Notes (Signed)
  Subjective:    Patient ID: Ashley [name], female    DOB: Feb 14, 1966, 47 y.o.   MRN: [id number]  HPI  47 year old female presents with recent recurrence of hyperglycemia off of metformin. She was told to stop metformin in 09/2013 giuven A1C so low.  She was initially put on metformin 1 year ago when CBGs fasting were high although A1C was pretty good.   Lab Results  Component Value Date   HGBA1C 5.7 09/29/2013   Recently 11/19 CBGs fasting 110-116. 1-2 hour postprandial 190-237.   She felt lightheaded and shaky... CBG 237. Had ER visit for this... By the time she got to ER CBG was 114.  EKG, nml, not orthostatic on vitals. Labs nml, mild anemia.    Review of Systems  Constitutional: Negative for fever and fatigue.  HENT: Negative for ear pain.   Eyes: Negative for pain.  Respiratory: Negative for chest tightness and shortness of breath.   Cardiovascular: Negative for chest pain, palpitations and leg swelling.  Gastrointestinal: Negative for abdominal pain.  Genitourinary: Negative for dysuria.  Skin: Negative for rash.  Neurological: Negative for syncope.       Objective:   Physical Exam  Constitutional: Vital signs are normal. She appears well-developed and well-nourished. She is cooperative.  Non-toxic appearance. She does not appear ill. No distress.  HENT:  Head: Normocephalic.  Right Ear: Hearing, tympanic membrane, external ear and ear canal normal. Tympanic membrane is not erythematous, not retracted and not bulging.  Left Ear: Hearing, tympanic membrane, external ear and ear canal normal. Tympanic membrane is not erythematous, not retracted and not bulging.  Nose: No mucosal edema or rhinorrhea. Right sinus exhibits no maxillary sinus tenderness and no frontal sinus tenderness. Left sinus exhibits no maxillary sinus tenderness and no frontal sinus tenderness.  Mouth/Throat: Uvula is midline, oropharynx is clear and moist and mucous membranes are normal.  Eyes:  Conjunctivae, EOM and lids are normal. Pupils are equal, round, and reactive to light. Lids are everted and swept, no foreign bodies found.  Neck: Trachea normal and normal range of motion. Neck supple. Carotid bruit is not present. No mass and no thyromegaly present.  Cardiovascular: Normal rate, regular rhythm, S1 normal, S2 normal, normal heart sounds, intact distal pulses and normal pulses.  Exam reveals no gallop and no friction rub.   No murmur heard. Pulmonary/Chest: Effort normal and breath sounds normal. Not tachypneic. No respiratory distress. She has no decreased breath sounds. She has no wheezes. She has no rhonchi. She has no rales.  Abdominal: Soft. Normal appearance and bowel sounds are normal. There is no tenderness.  Neurological: She is alert.  Skin: Skin is warm, dry and intact. No rash noted.  Psychiatric: Her speech is normal and behavior is normal. Judgment and thought content normal. Her mood appears not anxious. Cognition and memory are normal. She does not exhibit a depressed mood.          Assessment & Plan:

## 2013-11-13 NOTE — Assessment & Plan Note (Signed)
Refilled clonazepam.

## 2013-11-13 NOTE — Assessment & Plan Note (Signed)
Blood sugars have elevated again.. Pt symptomatic.  Restart metformin and work on low carb diet and regular exercise.

## 2013-11-27 ENCOUNTER — Telehealth: Payer: Self-pay | Admitting: Family Medicine

## 2013-11-27 ENCOUNTER — Other Ambulatory Visit: Payer: Self-pay | Admitting: *Deleted

## 2013-11-27 MED ORDER — METFORMIN HCL 500 MG PO TABS: 500.0000 mg | ORAL_TABLET | Freq: Every day | ORAL | Status: DC

## 2013-11-27 MED ORDER — TRAZODONE HCL 150 MG PO TABS: 150.0000 mg | ORAL_TABLET | Freq: Every day | ORAL | Status: DC

## 2013-11-27 NOTE — Telephone Encounter (Signed)
Patient Information:  Caller Name: Ahna  Phone: [phone]  Patient: Tien, Aispuro  Gender: Female  DOB: 01/25/1966  Age: 47 Years  PCP: Ruthe Mannan [facility])  Pregnant: No  Office Follow Up:  Does the office need to follow up with this patient?: Yes  Instructions For The Office: Home care advice and call back parameters reviewed. She would like to try home care mesause and will call back for questions, changes or concerns. Understanding expressed. Weekend /office hours provided.  RN Note:  Home care advice and call back parameters reviewed. She would like to try home care mesause and will call back for questions, changes or concerns. Understanding expressed. Weekend /office hours provided.  Symptoms  Reason For Call & Symptoms: "Burning at the top part of stomach" onset around fifteen minutes after eating.. Lasting 1-3 hours when it occurs.  She states a heavy feeling.  Onset 3 days.   No N/V/D.  Afebrile.  Reviewed Health History In EMR: Yes  Reviewed Medications In EMR: Yes  Reviewed Allergies In EMR: Yes  Reviewed Surgeries / Procedures: Yes  Date of Onset of Symptoms: 11/24/2013 OB / GYN:  LMP: 11/13/2013  Guideline(s) Used:  Abdominal Pain - Upper  Disposition Per Guideline:   See Today or Tomorrow in Office  Reason For Disposition Reached:   Mild pain that comes and goes (cramps) lasts > 24 hours  Advice Given:  Reassurance:  A mild stomachache can be from indigestion, stomach irritation, or overeating. Sometimes a stomachache signals the onset of a vomiting illness from a viral infection.  Here is some care advice that should help.  Fluids:   Sip clear fluids only (e.g., water, flat soft drinks, or half-strength fruit juice) until the pain is gone for 2 hours. Then slowly return to a regular diet.  Diet:  Slowly advance diet from clear liquids to a bland diet.  Avoid alcohol or caffeinated beverages.  Avoid greasy or fatty foods.  Antacid:  If having pain  now, try taking an antacid (e.g., Mylanta, Maalox). Dose: 2 tablespoons (30 ml) of liquid by mouth.  Call Back If:  Abdominal pain is constant and present for more than 2 hours.  You become worse.  Patient Will Follow Care Advice:  YES

## 2013-11-27 NOTE — Telephone Encounter (Signed)
Last office visit 11/13/2013 with Dr. Ermalene Searing.  Ok to refill?

## 2013-11-27 NOTE — Telephone Encounter (Signed)
Please call pt she can also try OTC prilosec 20 mg daily, also avoid spicy foods, citris, caffeine, alcohol, tomato. If not better in next 1-2 weeks on OTC prilosec make appt to be seen.

## 2013-11-27 NOTE — Telephone Encounter (Signed)
Patient notified as instructed by telephone. 

## 2013-11-30 ENCOUNTER — Other Ambulatory Visit: Payer: Self-pay | Admitting: *Deleted

## 2013-12-02 ENCOUNTER — Other Ambulatory Visit: Payer: Self-pay | Admitting: *Deleted

## 2013-12-02 ENCOUNTER — Telehealth: Payer: Self-pay | Admitting: *Deleted

## 2013-12-02 NOTE — Telephone Encounter (Signed)
Clarification form for Metformin in your IN box for completion.

## 2013-12-08 ENCOUNTER — Ambulatory Visit
Admission: RE | Admit: 2013-12-08 | Discharge: 2013-12-08 | Disposition: A | Discharge status: Home / Self Care | Payer: No Typology Code available for payment source | Source: Ambulatory Visit | Attending: Family Medicine | Admitting: Family Medicine

## 2013-12-08 ENCOUNTER — Encounter: Payer: Self-pay | Admitting: Family Medicine

## 2013-12-08 ENCOUNTER — Telehealth: Payer: Self-pay | Admitting: Family Medicine

## 2013-12-08 ENCOUNTER — Ambulatory Visit (INDEPENDENT_AMBULATORY_CARE_PROVIDER_SITE_OTHER): Payer: No Typology Code available for payment source | Admitting: Family Medicine

## 2013-12-08 VITALS — BP 110/70 | HR 64 | Temp 98.2°F | Ht 62.5 in | Wt 139.0 lb

## 2013-12-08 DIAGNOSIS — R1011 Right upper quadrant pain: Secondary | ICD-10-CM

## 2013-12-08 LAB — CBC WITH DIFFERENTIAL/PLATELET
Basophils Absolute: 0.1 10*3/uL (ref 0.0–0.1)
Eosinophils Absolute: 0.3 10*3/uL (ref 0.0–0.7)
Eosinophils Relative: 4.2 % (ref 0.0–5.0)
Hemoglobin: 12.7 g/dL (ref 12.0–15.0)
Lymphocytes Relative: 35.9 % (ref 12.0–46.0)
MCHC: 33.8 g/dL (ref 30.0–36.0)
Monocytes Absolute: 0.5 10*3/uL (ref 0.1–1.0)
Monocytes Relative: 7.5 % (ref 3.0–12.0)
Neutrophils Relative %: 51.7 % (ref 43.0–77.0)
Platelets: 434 10*3/uL — ABNORMAL HIGH (ref 150.0–400.0)
RDW: 13.6 % (ref 11.5–14.6)
WBC: 7.2 10*3/uL (ref 4.5–10.5)

## 2013-12-08 LAB — COMPREHENSIVE METABOLIC PANEL
AST: 15 U/L (ref 0–37)
Albumin: 4.4 g/dL (ref 3.5–5.2)
CO2: 24 mEq/L (ref 19–32)
Calcium: 8.9 mg/dL (ref 8.4–10.5)
Chloride: 103 mEq/L (ref 96–112)
Creatinine, Ser: 0.7 mg/dL (ref 0.4–1.2)
GFR: 103.44 mL/min (ref >60.00)
Glucose, Bld: 96 mg/dL (ref 70–99)
Potassium: 4.3 mEq/L (ref 3.5–5.1)
Sodium: 136 mEq/L (ref 135–145)
Total Bilirubin: 0.4 mg/dL (ref 0.3–1.2)
Total Protein: 7.3 g/dL (ref 6.0–8.3)

## 2013-12-08 MED ORDER — PROMETHAZINE HCL 25 MG PO TABS: ORAL_TABLET | ORAL | Status: DC

## 2013-12-08 NOTE — Patient Instructions (Addendum)
Stop at lab on your way out. Stop at front desk to schedule Ultrasound of RUQuadrant.   Increase water and fiber in diet. Increase prilosec to 2 x 20 mg tablets daily.  Avoid greasy/fatty foods, avoid spicy, tomato, citris, acidic foods. Go to ER if fever, uncontrollable vomiting, and constant pain > 3 hours.

## 2013-12-08 NOTE — Progress Notes (Signed)
   Subjective:    Patient ID: Ashley [name], female    DOB: 11-09-1966, 47 y.o.   MRN: [id number]  HPI   47 year old female pt of Dr. Elmer Sow presents  With new onset burning pain in right upper quadrant ongoing x 1 month. Radiates to right flank and upper back. Has noted when she wakes up in AM. Nausea associated, increase in gas, constipated.. Stools are darker than normal but not black/tarry.  BMs every other day but hard. Pain is gradually worsening.  Wakes up with pain in AM, improves some but returns after drinking or eating food.  Pain is worse after eating and drinking. Occurred with ice cream as well as many other things. Worse with dep breaths. Tried prilosec 20 mg x 2 weeks no relief. Has tried mylanta without relief.  She has  history of GERD or PUD, still has gallbladder. Never had similar symptoms.     Review of Systems  Constitutional: Positive for fatigue. Negative for fever.  HENT: Negative for ear pain.   Eyes: Negative for pain.  Respiratory: Negative for chest tightness and shortness of breath.   Cardiovascular: Negative for chest pain, palpitations and leg swelling.  Gastrointestinal: Positive for nausea, abdominal pain and constipation. Negative for vomiting and blood in stool.  Genitourinary: Negative for dysuria.       Objective:   Physical Exam  Constitutional: Vital signs are normal. She appears well-developed and well-nourished. She is cooperative.  Non-toxic appearance. She does not appear ill. No distress.  HENT:  Head: Normocephalic.  Right Ear: Hearing, tympanic membrane, external ear and ear canal normal. Tympanic membrane is not erythematous, not retracted and not bulging.  Left Ear: Hearing, tympanic membrane, external ear and ear canal normal. Tympanic membrane is not erythematous, not retracted and not bulging.  Nose: No mucosal edema or rhinorrhea. Right sinus exhibits no maxillary sinus tenderness and no frontal sinus tenderness. Left sinus  exhibits no maxillary sinus tenderness and no frontal sinus tenderness.  Mouth/Throat: Uvula is midline, oropharynx is clear and moist and mucous membranes are normal.  Eyes: Conjunctivae, EOM and lids are normal. Pupils are equal, round, and reactive to light. Lids are everted and swept, no foreign bodies found.  Neck: Trachea normal and normal range of motion. Neck supple. Carotid bruit is not present. No mass and no thyromegaly present.  Cardiovascular: Normal rate, regular rhythm, S1 normal, S2 normal, normal heart sounds, intact distal pulses and normal pulses.  Exam reveals no gallop and no friction rub.   No murmur heard. Pulmonary/Chest: Effort normal and breath sounds normal. Not tachypneic. No respiratory distress. She has no decreased breath sounds. She has no wheezes. She has no rhonchi. She has no rales.  Abdominal: Soft. Normal appearance. Bowel sounds are increased. There is no hepatosplenomegaly. There is tenderness in the right upper quadrant. There is tenderness at McBurney's point and positive Murphy's sign. There is no rebound and no CVA tenderness.  Neurological: She is alert.  Skin: Skin is warm, dry and intact. No rash noted.  Psychiatric: Her speech is normal and behavior is normal. Judgment and thought content normal. Her mood appears not anxious. Cognition and memory are normal. She does not exhibit a depressed mood.          Assessment & Plan:

## 2013-12-08 NOTE — Progress Notes (Signed)
Pre-visit discussion using our clinic review tool. No additional management support is needed unless otherwise documented below in the visit note.  

## 2013-12-08 NOTE — Telephone Encounter (Signed)
Notify pt that labs are normal.. Next step is referral to GI if sym[ptoms not improving with prilosec 40 mg daily .Marland Kitchen I went ahead and put in referralt... But she should given it atleast a few days to week to see if prilosec helps at higher dose.

## 2013-12-08 NOTE — Assessment & Plan Note (Signed)
Most consistent with gallbladder disease although Gastritis and PUD/duodenal ulcer are possible. Eval with CMET ,c bc and [place] RUQ. INCrease prilosec to 40 mg daily, avoid acidic and greasy foods.

## 2013-12-09 NOTE — Telephone Encounter (Signed)
Steffie notified as instructed by telephone.  She just started the Prilosec 40 mg this morning.  Feels she will probably need the GI referral.  Advised Shirlee Limerick would be calling her once she has that appointment scheduled for her.

## 2013-12-10 ENCOUNTER — Emergency Department (HOSPITAL_COMMUNITY): Payer: No Typology Code available for payment source

## 2013-12-10 ENCOUNTER — Encounter (HOSPITAL_COMMUNITY): Payer: Self-pay | Admitting: Emergency Medicine

## 2013-12-10 ENCOUNTER — Telehealth: Payer: Self-pay | Admitting: Family Medicine

## 2013-12-10 ENCOUNTER — Emergency Department (HOSPITAL_COMMUNITY)
Admission: EM | Admit: 2013-12-10 | Discharge: 2013-12-10 | Disposition: A | Discharge status: Home / Self Care | Payer: No Typology Code available for payment source | Attending: Emergency Medicine | Admitting: Emergency Medicine

## 2013-12-10 DIAGNOSIS — Z880 Allergy status to penicillin: Secondary | ICD-10-CM | POA: Insufficient documentation

## 2013-12-10 DIAGNOSIS — R110 Nausea: Secondary | ICD-10-CM | POA: Insufficient documentation

## 2013-12-10 DIAGNOSIS — Z3202 Encounter for pregnancy test, result negative: Secondary | ICD-10-CM | POA: Insufficient documentation

## 2013-12-10 DIAGNOSIS — R1013 Epigastric pain: Secondary | ICD-10-CM | POA: Insufficient documentation

## 2013-12-10 DIAGNOSIS — R109 Unspecified abdominal pain: Secondary | ICD-10-CM

## 2013-12-10 DIAGNOSIS — Z79899 Other long term (current) drug therapy: Secondary | ICD-10-CM | POA: Insufficient documentation

## 2013-12-10 DIAGNOSIS — F411 Generalized anxiety disorder: Secondary | ICD-10-CM | POA: Insufficient documentation

## 2013-12-10 DIAGNOSIS — E119 Type 2 diabetes mellitus without complications: Secondary | ICD-10-CM | POA: Insufficient documentation

## 2013-12-10 DIAGNOSIS — K5900 Constipation, unspecified: Secondary | ICD-10-CM | POA: Insufficient documentation

## 2013-12-10 DIAGNOSIS — F17200 Nicotine dependence, unspecified, uncomplicated: Secondary | ICD-10-CM | POA: Insufficient documentation

## 2013-12-10 LAB — URINALYSIS, ROUTINE W REFLEX MICROSCOPIC
Glucose, UA: NEGATIVE mg/dL
Ketones, ur: NEGATIVE mg/dL
Nitrite: NEGATIVE
Protein, ur: NEGATIVE mg/dL
Urobilinogen, UA: 0.2 mg/dL (ref 0.0–1.0)

## 2013-12-10 LAB — COMPREHENSIVE METABOLIC PANEL
ALT: 8 U/L (ref 0–35)
AST: 15 U/L (ref 0–37)
Alkaline Phosphatase: 61 U/L (ref 39–117)
BUN: 8 mg/dL (ref 6–23)
CO2: 22 mEq/L (ref 19–32)
GFR calc Af Amer: >90 mL/min (ref >90)
GFR calc non Af Amer: >90 mL/min (ref >90)
Glucose, Bld: 84 mg/dL (ref 70–99)
Potassium: 4 mEq/L (ref 3.5–5.1)
Sodium: 135 mEq/L (ref 135–145)
Total Protein: 7.3 g/dL (ref 6.0–8.3)

## 2013-12-10 LAB — POCT I-STAT TROPONIN I: Troponin i, poc: 0 ng/mL (ref 0.00–0.08)

## 2013-12-10 LAB — CBC
HCT: 36.9 % (ref 36.0–46.0)
Hemoglobin: 12.8 g/dL (ref 12.0–15.0)
MCH: 32.8 pg (ref 26.0–34.0)
MCHC: 34.7 g/dL (ref 30.0–36.0)
Platelets: 388 10*3/uL (ref 150–400)
RDW: 13.4 % (ref 11.5–15.5)

## 2013-12-10 LAB — URINE MICROSCOPIC-ADD ON

## 2013-12-10 LAB — LIPASE, BLOOD: Lipase: 23 U/L (ref 11–59)

## 2013-12-10 LAB — PREGNANCY, URINE: Preg Test, Ur: NEGATIVE

## 2013-12-10 MED ORDER — IOHEXOL 350 MG/ML SOLN
100.0000 mL | Freq: Once | INTRAVENOUS | Status: AC | PRN
  Administered 2013-12-10: 100 mL via INTRAVENOUS

## 2013-12-10 MED ORDER — IOHEXOL 300 MG/ML  SOLN
25.0000 mL | Freq: Once | INTRAMUSCULAR | Status: AC | PRN
  Administered 2013-12-10: 25 mL via ORAL

## 2013-12-10 MED ORDER — MORPHINE SULFATE 4 MG/ML IJ SOLN
4.0000 mg | Freq: Once | INTRAMUSCULAR | Status: AC
  Administered 2013-12-10: 4 mg via INTRAVENOUS
  Filled 2013-12-10: qty 1

## 2013-12-10 MED ORDER — ACETAMINOPHEN 325 MG PO TABS
650.0000 mg | ORAL_TABLET | Freq: Once | ORAL | Status: AC
  Administered 2013-12-10: 650 mg via ORAL
  Filled 2013-12-10: qty 2

## 2013-12-10 MED ORDER — ONDANSETRON 4 MG PO TBDP: 4.0000 mg | ORAL_TABLET | Freq: Three times a day (TID) | ORAL | Status: DC | PRN

## 2013-12-10 MED ORDER — POLYETHYLENE GLYCOL 3350 17 GM/SCOOP PO POWD: 17.0000 g | Freq: Two times a day (BID) | ORAL | Status: DC

## 2013-12-10 MED ORDER — SODIUM CHLORIDE 0.9 % IV BOLUS (SEPSIS)
1000.0000 mL | Freq: Once | INTRAVENOUS | Status: AC
  Administered 2013-12-10: 1000 mL via INTRAVENOUS

## 2013-12-10 MED ORDER — DICYCLOMINE HCL 20 MG PO TABS: 20.0000 mg | ORAL_TABLET | Freq: Two times a day (BID) | ORAL | Status: DC

## 2013-12-10 MED ORDER — ONDANSETRON HCL 4 MG/2ML IJ SOLN
4.0000 mg | Freq: Once | INTRAMUSCULAR | Status: AC
  Administered 2013-12-10: 4 mg via INTRAVENOUS
  Filled 2013-12-10: qty 2

## 2013-12-10 MED ORDER — MINERAL OIL RE ENEM: 1.0000 | ENEMA | Freq: Once | RECTAL | Status: DC

## 2013-12-10 NOTE — ED Notes (Signed)
Returned from ultrasound.

## 2013-12-10 NOTE — Telephone Encounter (Signed)
Patient Information:  Caller Name: Debbra  Phone: [phone]  Patient: Ashley [name], Ashley [name]  Gender: Female  DOB: June 25, 1966  Age: 47 Years  PCP: Ruthe Mannan [facility])  Pregnant: No  Office Follow Up:  Does the office need to follow up with this patient?: No  Instructions For The Office: N/A  RN Note:  On 12/08/13, MD advised to be seen if pain lasted > 3 hours or fever. Called office per MD orders for ED disposition when office is open.  Dr. Ermalene Searing ordered to go to [facility] ED now.  Symptoms  Reason For Call & Symptoms: Emergent Call: Ongoing, constant, "burning" right upper quaadrant abdominal pain radiating down right side into back.  Pain rated 9/10 since 0730.  Nauseated; no vomiting.  No diarrhea.  Last BP 12/10/13; hard stool. Seen 12/08/13. Sonogram for gallstones negative.  Being referred to GI.  Reviewed Health History In EMR: Yes  Reviewed Medications In EMR: Yes  Reviewed Allergies In EMR: Yes  Reviewed Surgeries / Procedures: Yes  Date of Onset of Symptoms: 11/10/2013  Treatments Tried: > Prilosec dose to 40 mg. Stopped taking other meds 12/10/13.  Treatments Tried Worked: No OB / GYN:  LMP: 11/22/2013  Guideline(s) Used:  Abdominal Pain - Upper  Disposition Per Guideline:   Go to ED Now  Reason For Disposition Reached:   Severe abdominal pain (e.g., excruciating)  Advice Given:  Call Back If:  Abdominal pain is constant and present for more than 2 hours.  You become worse.  Patient Will Follow Care Advice:  YES

## 2013-12-10 NOTE — ED Provider Notes (Signed)
CSN: [id number]     Arrival date & time 12/10/13  1219 History   First MD Initiated Contact with Patient 12/10/13 1444     Chief Complaint  Patient presents with  . Abdominal Pain   (Consider location/radiation/quality/duration/timing/severity/associated sxs/prior Treatment) HPI Comments: Patient is a 47 year old female past history significant for anxiety, tobacco abuse, DM presented to the emergency department for 3 weeks of gradually worsening waxing and waning burning right upper quadrant pain with radiation to back into lower abdomen with associated nausea. Patient states she was evaluated by her primary care doctor on Monday for gallstones and had negative blood work and ultrasound performed. She states since Monday her pain has been more constant and severe. Patient's abdominal surgical history includes appendectomy.   Patient is a 47 y.o. female presenting with abdominal pain.  Abdominal Pain Associated symptoms: nausea   Associated symptoms: no diarrhea, no fever, no shortness of breath and no vomiting     Past Medical History  Diagnosis Date  . Anxiety   . Tobacco abuse   . Diabetes mellitus without complication    Past Surgical History  Procedure Laterality Date  . Appendectomy  1988   Family History  Problem Relation Age of Onset  . Diabetes Mother   . Hyperlipidemia Mother   . Hypertension Mother    History  Substance Use Topics  . Smoking status: Current Every Day Smoker -- 0.50 packs/day  . Smokeless tobacco: Never Used  . Alcohol Use: No   OB History   Grav Para Term Preterm Abortions TAB SAB Ect Mult Living                 Review of Systems  Constitutional: Negative for fever.  Respiratory: Negative for shortness of breath.   Gastrointestinal: Positive for nausea and abdominal pain. Negative for vomiting and diarrhea.  All other systems reviewed and are negative.    Allergies  Penicillins  Home Medications   Current Outpatient Rx  Name  Route   Sig  Dispense  Refill  . albuterol (PROVENTIL HFA;VENTOLIN HFA) 108 (90 BASE) MCG/ACT inhaler   Inhalation   Inhale 2 puffs into the lungs every 6 (six) hours as needed for wheezing.         . clonazePAM (KLONOPIN) 1 MG tablet   Oral   Take 1 tablet (1 mg total) by mouth 2 (two) times daily.   60 tablet   0   . Estradiol 10 MCG TABS vaginal tablet   Vaginal   Place 1 tablet vaginally See admin instructions. Takes Tuesday and Friday at bedtime         . metFORMIN (GLUCOPHAGE) 500 MG tablet   Oral   Take 1 tablet (500 mg total) by mouth daily with breakfast.   90 tablet   1   . norethindrone (ERRIN) 0.35 MG tablet   Oral   Take 1 tablet by mouth daily.         Marland Kitchen omeprazole (PRILOSEC) 40 MG capsule   Oral   Take 40 mg by mouth daily.         . promethazine (PHENERGAN) 25 MG tablet      Take 1 tab by mouth every 6 hours as needed for nausea   30 tablet   0   . traZODone (DESYREL) 150 MG tablet   Oral   Take 1 tablet (150 mg total) by mouth at bedtime.   90 tablet   0   . varenicline (CHANTIX CONTINUING MONTH  PAK) 1 MG tablet   Oral   Take 1 tablet (1 mg total) by mouth 2 (two) times daily.   60 tablet   2    BP 115/62  Pulse 67  Temp(Src) 98.2 F (36.8 C) (Oral)  Resp 16  Wt 146 lb 9 oz (66.48 kg)  SpO2 99%  LMP 11/22/2013 Physical Exam  Constitutional: She is oriented to person, place, and time. She appears well-developed and well-nourished. No distress.  HENT:  Head: Normocephalic and atraumatic.  Right Ear: External ear normal.  Left Ear: External ear normal.  Nose: Nose normal.  Eyes: Conjunctivae are normal.  Neck: Neck supple.  Cardiovascular: Normal rate, regular rhythm and normal heart sounds.   Pulmonary/Chest: Effort normal and breath sounds normal. No respiratory distress.  Abdominal: Soft. Bowel sounds are normal. She exhibits no distension. There is tenderness in the right upper quadrant and epigastric area. There is positive  Murphy's sign. There is no rigidity, no rebound, no guarding and no CVA tenderness.  Neurological: She is alert and oriented to person, place, and time.  Skin: Skin is warm and dry. She is not diaphoretic.    ED Course  Procedures (including critical care time) Medications  morphine 4 MG/ML injection 4 mg (4 mg Intravenous Given 12/10/13 1555)  sodium chloride 0.9 % bolus 1,000 mL (0 mLs Intravenous Stopped 12/10/13 1714)  ondansetron (ZOFRAN) injection 4 mg (4 mg Intravenous Given 12/10/13 1554)  morphine 4 MG/ML injection 4 mg (4 mg Intravenous Given 12/10/13 1714)  iohexol (OMNIPAQUE) 300 MG/ML solution 25 mL (25 mLs Oral Contrast Given 12/10/13 1809)  iohexol (OMNIPAQUE) 350 MG/ML injection 100 mL (100 mLs Intravenous Contrast Given 12/10/13 1942)    Labs Review Labs Reviewed  URINALYSIS, ROUTINE W REFLEX MICROSCOPIC - Abnormal; Notable for the following:    Hgb urine dipstick TRACE (*)    All other components within normal limits  COMPREHENSIVE METABOLIC PANEL  CBC  LIPASE, BLOOD  URINE MICROSCOPIC-ADD ON  PREGNANCY, URINE  POCT I-STAT TROPONIN I   Imaging Review Ct Abdomen Pelvis W Contrast  12/10/2013   CLINICAL DATA:  Progressive right upper quadrant abdominal pain.  EXAM: CT ABDOMEN AND PELVIS WITH CONTRAST  TECHNIQUE: Multidetector CT imaging of the abdomen and pelvis was performed using the standard protocol following bolus administration of intravenous contrast.  CONTRAST:  OMNIPAQUE IOHEXOL 350 MG/ML SOLN  COMPARISON:  None.  FINDINGS: The liver, biliary tree, spleen, pancreas, adrenal glands, and kidneys are normal. The bowel is normal including the terminal ileum. There is a moderate amount of stool throughout than nondistended colon. There is no fecal impaction.  Bladder appears normal. 14 mm cysts on each ovary, probably follicular in origin. Uterus is normal. No osseous abnormality. There is atheromatous irregularity of the abdominal aorta and proximal common  iliac arteries.  IMPRESSION: No acute abnormality. Atherosclerotic disease. Moderate amount of stool in the colon.   Electronically Signed   By: Geanie Cooley M.D.   On: 12/10/2013 20:13   US Abdomen Limited Ruq  12/10/2013   CLINICAL DATA:  Right upper quadrant pain.  EXAM: US ABDOMEN LIMITED - RIGHT UPPER QUADRANT  COMPARISON:  12/08/2013.  FINDINGS: Gallbladder  Tiny 1.5 mm polyp in the gallbladder cannot be excluded. No gallstones. Gallbladder is otherwise unremarkable. Gallbladder wall thickness 1.2 mm. Negative Murphy sign. No pericholecystic fluid.  Common bile duct  Diameter: 4.5 mm.  Liver:  No focal lesion identified. Within normal limits in parenchymal echogenicity.  IMPRESSION: Tiny 1.5  mm gallbladder polyp cannot be excluded on today's exam. Exam otherwise normal.   Electronically Signed   By: Maisie Fus  Register   On: 12/10/2013 16:37    EKG Interpretation   None       MDM  No diagnosis found.  Afebrile, NAD, non-toxic appearing, uncomfortable appearing, AAOx4. Abdomen soft, exquisitely tender in RUQ and RLQ. RUQ US performed to assess gallbladder. Negative results. Patient remains diffusely tender in ED despite pain medication. Will obtain CT abdomen/pelvis that shows moderate stool burden, will prescribe Miralax. I have reviewed nursing notes, vital signs, and all appropriate lab and imaging results for this patient.Advised GI f/u for gallbladder polyp. Return precautions discussed. Patient is agreeable to plan. Patient is stable at time of discharge. Patient d/w with Dr. Loretha Stapler, agrees with plan.           Jeannetta Ellis, PA-C 12/10/13 2025

## 2013-12-10 NOTE — ED Notes (Signed)
Finished drinking contrast, CT scan notified

## 2013-12-10 NOTE — ED Notes (Signed)
Per pt sts burning in RUQ that has been going on since Thanksgiving. sts that she was seen by doctor Tuesday and had [place] for gallstones and blood work which was all normal. sts it has gotten worse and she called her doctor and was sent here.

## 2013-12-11 NOTE — ED Provider Notes (Signed)
Medical screening examination/treatment/procedure(s) were performed by non-physician practitioner and as supervising physician I was immediately available for consultation/collaboration.  EKG Interpretation   None         Candyce Churn, MD 12/11/13 1055

## 2013-12-15 ENCOUNTER — Encounter: Payer: Self-pay | Admitting: Physician Assistant

## 2013-12-15 ENCOUNTER — Ambulatory Visit (INDEPENDENT_AMBULATORY_CARE_PROVIDER_SITE_OTHER): Payer: No Typology Code available for payment source | Admitting: Physician Assistant

## 2013-12-15 ENCOUNTER — Telehealth: Payer: Self-pay | Admitting: *Deleted

## 2013-12-15 VITALS — BP 100/64 | HR 80 | Ht 62.5 in | Wt 139.8 lb

## 2013-12-15 DIAGNOSIS — R1013 Epigastric pain: Secondary | ICD-10-CM

## 2013-12-15 DIAGNOSIS — R1011 Right upper quadrant pain: Secondary | ICD-10-CM

## 2013-12-15 MED ORDER — OMEPRAZOLE 40 MG PO CPDR: DELAYED_RELEASE_CAPSULE | ORAL | Status: DC

## 2013-12-15 MED ORDER — TRAMADOL HCL 50 MG PO TABS: ORAL_TABLET | ORAL | Status: DC

## 2013-12-15 NOTE — Telephone Encounter (Signed)
yes

## 2013-12-15 NOTE — Progress Notes (Signed)
Subjective:    Patient ID: Ashley [name], female    DOB: 05/08/1966, 47 y.o.   MRN: [id number]  HPI  Ashley [name] is a pleasant 47 year old white female new to GI today referred by Dr. Ermalene Searing for evaluation of upper abdominal  pain. Patient has history of previous appendectomy, bilateral tubal ligation and chronic anxiety/migraine headaches. She states she has been having epigastric and right upper quadrant abdominal pain which is burning in nature and fairly constant over the past one month. This is been associated with intermittent nausea. She had been placed on omeprazole 20 mg by mouth daily about 2 weeks ago but so far has not noticed any improvement in her symptoms. She says at times she feels burning around into her back. Generally she feels worse immediately after eating. She has been constipated and says she's been going several days between bowel movements. She tried laxatives earlier this week as well as an enema without much result at all. Has not noted any melena or hematochezia. There's been no fever or chills. She had not been on any regular NSAIDs. Is labs were done last week with an unremarkable CBC cemented lipase. She had upper about ultrasound done on 12/10/2013 showed a 1.5 mm gallbladder polyp but otherwise unremarkable exam. CT scan of the abdomen and pelvis was done on 1218 as well with contrast. She had a moderate amount of stool throughout her colon but otherwise unremarkable study.    Review of Systems  Constitutional: Positive for appetite change.  HENT: Negative.   Eyes: Negative.   Respiratory: Negative.   Cardiovascular: Negative.   Gastrointestinal: Positive for nausea, abdominal pain, constipation and abdominal distention.  Endocrine: Negative.   Genitourinary: Negative.   Musculoskeletal: Negative.   Skin: Negative.   Allergic/Immunologic: Negative.   Neurological: Negative.   Hematological: Negative.   Psychiatric/Behavioral: Negative.    Outpatient Prescriptions  Prior to Visit  Medication Sig Dispense Refill  . albuterol (PROVENTIL HFA;VENTOLIN HFA) 108 (90 BASE) MCG/ACT inhaler Inhale 2 puffs into the lungs every 6 (six) hours as needed for wheezing.      . clonazePAM (KLONOPIN) 1 MG tablet Take 1 tablet (1 mg total) by mouth 2 (two) times daily.  60 tablet  0  . dicyclomine (BENTYL) 20 MG tablet Take 1 tablet (20 mg total) by mouth 2 (two) times daily.  20 tablet  0  . Estradiol 10 MCG TABS vaginal tablet Place 1 tablet vaginally See admin instructions. Takes Tuesday and Friday at bedtime      . metFORMIN (GLUCOPHAGE) 500 MG tablet Take 1 tablet (500 mg total) by mouth daily with breakfast.  90 tablet  1  . norethindrone (ERRIN) 0.35 MG tablet Take 1 tablet by mouth daily.      . polyethylene glycol powder (GLYCOLAX/MIRALAX) powder Take 17 g by mouth 2 (two) times daily. Until daily soft stools  OTC  255 g  0  . promethazine (PHENERGAN) 25 MG tablet Take 1 tab by mouth every 6 hours as needed for nausea  30 tablet  0  . traZODone (DESYREL) 150 MG tablet Take 1 tablet (150 mg total) by mouth at bedtime.  90 tablet  0  . varenicline (CHANTIX CONTINUING MONTH PAK) 1 MG tablet Take 1 tablet (1 mg total) by mouth 2 (two) times daily.  60 tablet  2  . omeprazole (PRILOSEC) 40 MG capsule Take 40 mg by mouth daily.      . mineral oil enema Place 1 enema rectally once.  133 mL  0  . ondansetron (ZOFRAN ODT) 4 MG disintegrating tablet Take 1 tablet (4 mg total) by mouth every 8 (eight) hours as needed for nausea or vomiting.  10 tablet  0   No facility-administered medications prior to visit.   Allergies  Allergen Reactions  . Penicillins Anaphylaxis    REACTION: Throat swells   . Patient Active Problem List   Diagnosis Date Noted  . RUQ pain 12/08/2013  . Lightheadedness 11/13/2013  . Migraine with intractable migraine 10/09/2013  . Hyperglycemia 01/26/2013  . Dyspareunia 01/26/2013  . Routine general medical examination at a health care facility  05/24/2011  . MIGRAINE HEADACHE 05/30/2010  . ANXIETY 09/30/2009  . TOBACCO ABUSE 09/30/2009  . INSOMNIA, CHRONIC 09/30/2009  . ADJUSTMENT DISORDER WITH MIXED FEATURES 09/30/2009  . ELEVATED BLOOD PRESSURE WITHOUT DIAGNOSIS OF HYPERTENSION 09/30/2009   History  Substance Use Topics  . Smoking status: Current Every Day Smoker -- 0.50 packs/day  . Smokeless tobacco: Never Used     Comment: Patient taking Chantix  . Alcohol Use: No   family history includes Diabetes in her mother; Hyperlipidemia in her mother; Hypertension in her mother.     Objective:   Physical Exam Well-developed white female in no acute distress, pleasant pressure 100/64 pulse 80 height 5 foot 2 weight 139. HEENT; nontraumatic normocephalic EOMI PERRLA sclera anicteric poor dentition, Supple; no JVD, Cardiovascular; regular rate and rhythm with S1-S2 no murmur or gallop, Pulmonary; clear bilaterally, Abdomen; soft she is tender in the epigastric and right upper quadrant there is no guarding or rebound no palpable mass or hepatosplenomegaly she has very mild bilateral lower quadrant tenderness as well bowel sounds are present, Rectal; exam not done, Extremities; no clubbing cyanosis or edema skin warm and dry, Psych; mood and affect appropriate       Assessment & Plan:  #18  47 year old white female with one month history of epigastric and right upper quadrant pain. Workup to date nonrevealing with ultrasound and CT scan. She may have peptic ulcer disease. Also consider dysfunctional gallbladder though less likely #3 constipation #4 anxiety #5 history of migraines  Plan; increase omeprazole to 40 mg by mouth twice daily She will continue Phenergan as needed for nausea Patient was given a sample Suprep  to help her purge her bowels and is asked after that to continue MiraLax once daily Schedule for upper endoscopy with Dr. Jarold Motto. Procedure was discussed in detail with the patient and she is agreeable to  proceed

## 2013-12-15 NOTE — Telephone Encounter (Signed)
Received faxed refill request from pharmacy for Promethazine.  Last refill 12/08/13 #30 to [facility]. Is it okay to refill medication?

## 2013-12-15 NOTE — Patient Instructions (Signed)
We have given you a prescription for Tramadol for pain which  We faxed to [facility].  We have given you a prep to help purge your bowels.  There are two doses.  You can take the first dose and if you havn't moved your bowels well, then you can take the second dose.  You have been scheduled for an endoscopy with propofol. Please follow written instructions given to you at your visit today.

## 2013-12-16 MED ORDER — PROMETHAZINE HCL 25 MG PO TABS: ORAL_TABLET | ORAL | Status: DC

## 2013-12-16 NOTE — Telephone Encounter (Signed)
Refill sent to pharmacy as instructed. 

## 2013-12-22 ENCOUNTER — Other Ambulatory Visit: Payer: Self-pay

## 2013-12-22 MED ORDER — CLONAZEPAM 1 MG PO TABS: 1.0000 mg | ORAL_TABLET | Freq: Two times a day (BID) | ORAL | Status: DC

## 2013-12-22 NOTE — Telephone Encounter (Signed)
Pt request refill clonazepam to [facility] [place].Please advise.

## 2013-12-22 NOTE — Telephone Encounter (Signed)
Attempted to contact pharmacy numerous times;line busy. Will attempt to contact again on 12/23/13

## 2013-12-22 NOTE — Telephone Encounter (Signed)
Medication called into pharmacy as requested.

## 2013-12-28 ENCOUNTER — Encounter: Payer: Self-pay | Admitting: Gastroenterology

## 2013-12-28 ENCOUNTER — Ambulatory Visit (INDEPENDENT_AMBULATORY_CARE_PROVIDER_SITE_OTHER): Payer: No Typology Code available for payment source | Admitting: Gastroenterology

## 2013-12-28 ENCOUNTER — Telehealth: Payer: Self-pay | Admitting: *Deleted

## 2013-12-28 VITALS — BP 120/62 | HR 54 | Temp 99.0°F | Resp 16 | Ht 62.5 in | Wt 139.0 lb

## 2013-12-28 DIAGNOSIS — R1011 Right upper quadrant pain: Secondary | ICD-10-CM

## 2013-12-28 DIAGNOSIS — K2980 Duodenitis without bleeding: Secondary | ICD-10-CM

## 2013-12-28 DIAGNOSIS — D1330 Benign neoplasm of unspecified part of small intestine: Secondary | ICD-10-CM

## 2013-12-28 HISTORY — PX: ESOPHAGOGASTRODUODENOSCOPY: SHX1529

## 2013-12-28 LAB — GLUCOSE, CAPILLARY
GLUCOSE-CAPILLARY: 92 mg/dL (ref 70–99)
Glucose-Capillary: 96 mg/dL (ref 70–99)

## 2013-12-28 MED ORDER — SODIUM CHLORIDE 0.9 % IV SOLN: 500.0000 mL | INTRAVENOUS | Status: DC

## 2013-12-28 NOTE — Telephone Encounter (Signed)
Per Dr Sharlett Iles, schedule pt for a HIDA CCK scan. Scheduled for 01/14/14 at 07:30am, arrive at 07:15am, no stomach meds that am NPO after midnight. appt given to [facility] in [facility].

## 2013-12-28 NOTE — Progress Notes (Signed)
I assisted the pt into the restroom.  No problems noted while in the recovery room. Maw

## 2013-12-28 NOTE — Patient Instructions (Signed)
YOU HAD AN ENDOSCOPIC PROCEDURE TODAY AT THE [facility]: Refer to the procedure report that was given to you for any specific questions about what was found during the examination.  If the procedure report does not answer your questions, please call your gastroenterologist to clarify.  If you requested that your care partner not be given the details of your procedure findings, then the procedure report has been included in a sealed envelope for you to review at your convenience later.  YOU SHOULD EXPECT: Some feelings of bloating in the abdomen. Passage of more gas than usual.  Walking can help get rid of the air that was put into your GI tract during the procedure and reduce the bloating. If you had a lower endoscopy (such as a colonoscopy or flexible sigmoidoscopy) you may notice spotting of blood in your stool or on the toilet paper. If you underwent a bowel prep for your procedure, then you may not have a normal bowel movement for a few days.  DIET: Your first meal following the procedure should be a light meal and then it is ok to progress to your normal diet.  A half-sandwich or bowl of soup is an example of a good first meal.  Heavy or fried foods are harder to digest and may make you feel nauseous or bloated.  Likewise meals heavy in dairy and vegetables can cause extra gas to form and this can also increase the bloating.  Drink plenty of fluids but you should avoid alcoholic beverages for 24 hours.  ACTIVITY: Your care partner should take you home directly after the procedure.  You should plan to take it easy, moving slowly for the rest of the day.  You can resume normal activity the day after the procedure however you should NOT DRIVE or use heavy machinery for 24 hours (because of the sedation medicines used during the test).    SYMPTOMS TO REPORT IMMEDIATELY: A gastroenterologist can be reached at any hour.  During normal business hours, 8:30 AM to 5:00 PM Monday through Friday,  call [phone].  After hours and on weekends, please call the GI answering service at [phone] who will take a message and have the physician on call contact you.   Following upper endoscopy (EGD)  Vomiting of blood or coffee ground material  New chest pain or pain under the shoulder blades  Painful or persistently difficult swallowing  New shortness of breath  Fever of 100F or higher  Black, tarry-looking stools  FOLLOW UP: If any biopsies were taken you will be contacted by phone or by letter within the next 1-3 weeks.  Call your gastroenterologist if you have not heard about the biopsies in 3 weeks.  Our staff will call the home number listed on your records the next business day following your procedure to check on you and address any questions or concerns that you may have at that time regarding the information given to you following your procedure. This is a courtesy call and so if there is no answer at the home number and we have not heard from you through the emergency physician on call, we will assume that you have returned to your regular daily activities without incident.  SIGNATURES/CONFIDENTIALITY: You and/or your care partner have signed paperwork which will be entered into your electronic medical record.  These signatures attest to the fact that that the information above on your After Visit Summary has been reviewed and is understood.  Full responsibility   of the confidentiality of this discharge information lies with you and/or your care-partner.   Biopsies were taken.  Await biopsy results. Continue taking PPI medication. CCK-HIDA scan to check the gallbladder function.  Done 01-14-14 @ 7:30 ARRIVING AT 7:15 AT [facility].  No stomach medications that am of x-ray.  Nothing to eat or drink after midnight.  Will be there approximately 2 hours.  You will be able to drive you self. Your blood sugar was 92 in the recovery room. Please call if any questions or  concerns.

## 2013-12-28 NOTE — Op Note (Signed)
[place]  [address] [place], [id number]   ENDOSCOPY PROCEDURE REPORT  PATIENT: Ashley [name], Ashley [name]  MR#: [id number] BIRTHDATE: [birth year] , 72  yrs. old GENDER: Female ENDOSCOPIST:David Consuello Masse, MD, [facility] REFERRED BY: PROCEDURE DATE:  12/28/2013 PROCEDURE:   EGD w/ biopsy for H.pylori ASA CLASS:    Class II INDICATIONS: Dyspepsia. MEDICATION: propofol (Diprivan) 150mg  IV TOPICAL ANESTHETIC:  DESCRIPTION OF PROCEDURE:   After the risks and benefits of the procedure were explained, informed consent was obtained.  The LB [id number]  endoscope was introduced through the mouth  and advanced to the second portion of the duodenum .  The instrument was slowly withdrawn as the mucosa was fully examined.      ESOPHAGUS: The mucosa of the esophagus appeared normal.  STOMACH: The mucosa of the stomach appeared normal.  DUODENUM: Mild duodenal inflammation was found. No ulcer noted...CLO bx. done.   Retroflexed views revealed no abnormalities.    The scope was then withdrawn from the patient and the procedure completed.  COMPLICATIONS: There were no complications.   ENDOSCOPIC IMPRESSION: 1.   The mucosa of the esophagus appeared normal 2.   The mucosa of the stomach appeared normal 3.   Duodenal inflammation was found ...r/o H.pylori.r/o dysfunctional GB  RECOMMENDATIONS: 1.  Await biopsy results 2.  Continue PPI 3.  Rx CLO if positive 4.   CCK-HIDA scan    _______________________________ eSigned:  Sable Feil, MD, [employer] 12/28/2013 2:23 PM

## 2013-12-28 NOTE — Progress Notes (Signed)
A/ox3 pleased with MAC, report to Annette RN 

## 2013-12-28 NOTE — Progress Notes (Signed)
Called to room to assist during endoscopic procedure.  Patient ID and intended procedure confirmed with present staff. Received instructions for my participation in the procedure from the performing physician.  

## 2013-12-29 ENCOUNTER — Telehealth: Payer: Self-pay

## 2013-12-29 LAB — HELICOBACTER PYLORI SCREEN-BIOPSY: UREASE: NEGATIVE

## 2013-12-29 NOTE — Telephone Encounter (Signed)
  Follow up Call-  Call back number 12/28/2013  Post procedure Call Back phone  # [phone]  Permission to leave phone message Yes     Patient questions:  Do you have a fever, pain , or abdominal swelling? no Pain Score  0 *  Have you tolerated food without any problems? yes  Have you been able to return to your normal activities? yes  Do you have any questions about your discharge instructions: Diet   no Medications  no Follow up visit  no  Do you have questions or concerns about your Care? no  Actions: * If pain score is 4 or above: No action needed, pain <4.

## 2013-12-30 ENCOUNTER — Encounter: Payer: Self-pay | Admitting: Gastroenterology

## 2013-12-31 ENCOUNTER — Encounter: Payer: Self-pay | Admitting: Gastroenterology

## 2014-01-01 ENCOUNTER — Other Ambulatory Visit: Payer: No Typology Code available for payment source

## 2014-01-14 ENCOUNTER — Encounter (HOSPITAL_COMMUNITY)
Admission: RE | Admit: 2014-01-14 | Discharge: 2014-01-14 | Disposition: A | Discharge status: Home / Self Care | Payer: No Typology Code available for payment source | Source: Ambulatory Visit | Attending: Gastroenterology | Admitting: Gastroenterology

## 2014-01-14 DIAGNOSIS — R110 Nausea: Secondary | ICD-10-CM | POA: Insufficient documentation

## 2014-01-14 DIAGNOSIS — R1011 Right upper quadrant pain: Secondary | ICD-10-CM

## 2014-01-14 DIAGNOSIS — R109 Unspecified abdominal pain: Secondary | ICD-10-CM | POA: Insufficient documentation

## 2014-01-14 MED ORDER — TECHNETIUM TC 99M MEBROFENIN IV KIT
5.4000 | PACK | Freq: Once | INTRAVENOUS | Status: AC | PRN
  Administered 2014-01-14: 5 via INTRAVENOUS

## 2014-01-19 ENCOUNTER — Other Ambulatory Visit: Payer: Self-pay | Admitting: Family Medicine

## 2014-01-19 DIAGNOSIS — Z136 Encounter for screening for cardiovascular disorders: Secondary | ICD-10-CM

## 2014-01-19 DIAGNOSIS — R739 Hyperglycemia, unspecified: Secondary | ICD-10-CM

## 2014-01-19 DIAGNOSIS — Z0000 Encounter for general adult medical examination without abnormal findings: Secondary | ICD-10-CM

## 2014-01-20 ENCOUNTER — Ambulatory Visit: Payer: No Typology Code available for payment source | Admitting: Family Medicine

## 2014-01-28 ENCOUNTER — Other Ambulatory Visit: Payer: Self-pay | Admitting: Family Medicine

## 2014-01-28 ENCOUNTER — Other Ambulatory Visit (INDEPENDENT_AMBULATORY_CARE_PROVIDER_SITE_OTHER): Payer: No Typology Code available for payment source

## 2014-01-28 DIAGNOSIS — Z0000 Encounter for general adult medical examination without abnormal findings: Secondary | ICD-10-CM

## 2014-01-28 DIAGNOSIS — Z136 Encounter for screening for cardiovascular disorders: Secondary | ICD-10-CM

## 2014-01-28 DIAGNOSIS — R030 Elevated blood-pressure reading, without diagnosis of hypertension: Secondary | ICD-10-CM

## 2014-01-28 DIAGNOSIS — R7309 Other abnormal glucose: Secondary | ICD-10-CM

## 2014-01-28 DIAGNOSIS — R739 Hyperglycemia, unspecified: Secondary | ICD-10-CM

## 2014-01-28 LAB — CBC WITH DIFFERENTIAL/PLATELET
Basophils Absolute: 0 10*3/uL (ref 0.0–0.1)
Basophils Relative: 0.3 % (ref 0.0–3.0)
EOS ABS: 0.3 10*3/uL (ref 0.0–0.7)
Eosinophils Relative: 3.8 % (ref 0.0–5.0)
HCT: 38.3 % (ref 36.0–46.0)
Hemoglobin: 12.7 g/dL (ref 12.0–15.0)
Lymphocytes Relative: 27 % (ref 12.0–46.0)
Lymphs Abs: 2.3 10*3/uL (ref 0.7–4.0)
MCHC: 33.2 g/dL (ref 30.0–36.0)
MCV: 96.3 fl (ref 78.0–100.0)
MONO ABS: 0.5 10*3/uL (ref 0.1–1.0)
Monocytes Relative: 5.5 % (ref 3.0–12.0)
Neutro Abs: 5.3 10*3/uL (ref 1.4–7.7)
Neutrophils Relative %: 63.4 % (ref 43.0–77.0)
PLATELETS: 446 10*3/uL — AB (ref 150.0–400.0)
RBC: 3.97 Mil/uL (ref 3.87–5.11)
RDW: 13.5 % (ref 11.5–14.6)
WBC: 8.4 10*3/uL (ref 4.5–10.5)

## 2014-01-28 LAB — COMPREHENSIVE METABOLIC PANEL
ALK PHOS: 66 U/L (ref 39–117)
ALT: 15 U/L (ref 0–35)
AST: 15 U/L (ref 0–37)
Albumin: 4 g/dL (ref 3.5–5.2)
BILIRUBIN TOTAL: 0.4 mg/dL (ref 0.3–1.2)
BUN: 15 mg/dL (ref 6–23)
CO2: 24 mEq/L (ref 19–32)
Calcium: 8.8 mg/dL (ref 8.4–10.5)
Chloride: 106 mEq/L (ref 96–112)
Creatinine, Ser: 0.7 mg/dL (ref 0.4–1.2)
GFR: 99.83 mL/min (ref >60.00)
GLUCOSE: 114 mg/dL — AB (ref 70–99)
Potassium: 3.7 mEq/L (ref 3.5–5.1)
SODIUM: 138 meq/L (ref 135–145)
TOTAL PROTEIN: 7.3 g/dL (ref 6.0–8.3)

## 2014-01-28 LAB — TSH: TSH: 1.02 u[IU]/mL (ref 0.35–5.50)

## 2014-01-28 LAB — LIPID PANEL
CHOLESTEROL: 165 mg/dL (ref 0–200)
HDL: 42.3 mg/dL (ref >39.00)
LDL CALC: 105 mg/dL — AB (ref 0–99)
TRIGLYCERIDES: 88 mg/dL (ref 0.0–149.0)
Total CHOL/HDL Ratio: 4
VLDL: 17.6 mg/dL (ref 0.0–40.0)

## 2014-01-28 LAB — HEMOGLOBIN A1C: Hgb A1c MFr Bld: 6 % (ref 4.6–6.5)

## 2014-01-28 MED ORDER — CLONAZEPAM 1 MG PO TABS: 1.0000 mg | ORAL_TABLET | Freq: Two times a day (BID) | ORAL | Status: DC

## 2014-01-28 NOTE — Telephone Encounter (Signed)
See prev note, Rout to PCP

## 2014-01-28 NOTE — Telephone Encounter (Signed)
Spoke to pt and informed her Rx has been sent to requested pharmacy 

## 2014-01-28 NOTE — Telephone Encounter (Signed)
Pt is needing refill on Clonazepam 1 mg tablets. Pt uses [facility] in [place]. Pt says she tried to contact her pharmacy but the refill never got to [place]. Please advise Last office visit was 12.16.14 for acute visit with Dr. Diona Browner

## 2014-02-01 ENCOUNTER — Ambulatory Visit (INDEPENDENT_AMBULATORY_CARE_PROVIDER_SITE_OTHER): Payer: No Typology Code available for payment source | Admitting: Internal Medicine

## 2014-02-01 ENCOUNTER — Encounter: Payer: Self-pay | Admitting: Internal Medicine

## 2014-02-01 VITALS — BP 130/78 | HR 71 | Temp 98.9°F | Wt 141.5 lb

## 2014-02-01 DIAGNOSIS — G43909 Migraine, unspecified, not intractable, without status migrainosus: Secondary | ICD-10-CM

## 2014-02-01 MED ORDER — PROMETHAZINE HCL 25 MG/ML IJ SOLN
25.0000 mg | Freq: Once | INTRAMUSCULAR | Status: AC
  Administered 2014-02-01: 25 mg via INTRAMUSCULAR

## 2014-02-01 MED ORDER — KETOROLAC TROMETHAMINE 60 MG/2ML IM SOLN
60.0000 mg | Freq: Once | INTRAMUSCULAR | Status: AC
  Administered 2014-02-01: 60 mg via INTRAMUSCULAR

## 2014-02-01 NOTE — Progress Notes (Signed)
Subjective:    Patient ID: Ashley [name], female    DOB: 1966/02/03, 48 y.o.   MRN: [id number]  HPI  Pt presents to the clinic today with c/o migraine. She reports this started 2 days ago. She reports that it feels like a stabbing pain behind her right eye. She is sensitive to light and sound. She has taken phenergan without any relief. NSAIDs/ Tylenol do not even touch her migraines. She does have a history of migraines.  Review of Systems      Past Medical History  Diagnosis Date  . Anxiety   . Tobacco abuse   . Diabetes mellitus without complication   . Anemia   . Chronic headaches     Current Outpatient Prescriptions  Medication Sig Dispense Refill  . albuterol (PROVENTIL HFA;VENTOLIN HFA) 108 (90 BASE) MCG/ACT inhaler Inhale 2 puffs into the lungs every 6 (six) hours as needed for wheezing.      . clonazePAM (KLONOPIN) 1 MG tablet Take 1 tablet (1 mg total) by mouth 2 (two) times daily.  60 tablet  0  . dicyclomine (BENTYL) 20 MG tablet Take 1 tablet (20 mg total) by mouth 2 (two) times daily.  20 tablet  0  . Estradiol 10 MCG TABS vaginal tablet Place 1 tablet vaginally See admin instructions. Takes Tuesday and Friday at bedtime      . metFORMIN (GLUCOPHAGE) 500 MG tablet Take 1 tablet (500 mg total) by mouth daily with breakfast.  90 tablet  1  . norethindrone (ERRIN) 0.35 MG tablet Take 1 tablet by mouth daily.      Marland Kitchen omeprazole (PRILOSEC) 40 MG capsule Take 1 tab twice daily for one month then go to once daily  60 capsule  0  . ondansetron (ZOFRAN-ODT) 4 MG disintegrating tablet       . polyethylene glycol powder (GLYCOLAX/MIRALAX) powder Take 17 g by mouth 2 (two) times daily. Until daily soft stools  OTC  255 g  0  . promethazine (PHENERGAN) 25 MG tablet Take 1 tab by mouth every 6 hours as needed for nausea  30 tablet  0  . traMADol (ULTRAM) 50 MG tablet Take 1 tab every 6-8 hours as needed for pain  30 tablet  0  . traZODone (DESYREL) 150 MG tablet Take 1 tablet (150  mg total) by mouth at bedtime.  90 tablet  0  . varenicline (CHANTIX CONTINUING MONTH PAK) 1 MG tablet Take 1 tablet (1 mg total) by mouth 2 (two) times daily.  60 tablet  2   No current facility-administered medications for this visit.    Allergies  Allergen Reactions  . Penicillins Anaphylaxis    REACTION: Throat swells    Family History  Problem Relation Age of Onset  . Diabetes Mother   . Hyperlipidemia Mother   . Hypertension Mother     History   Social History  . Marital Status: Married    Spouse Name: N/A    Number of Children: 36  . Years of Education: N/A   Occupational History  . 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 Smoking status: Current Every Day Smoker -- 0.50 packs/day  . Smokeless tobacco: Never Used     Comment: Patient taking Chantix  . Alcohol Use: No  . Drug Use: No  . Sexual Activity: Not on file   Other Topics Concern  . Not on file   Social History  Narrative   5 living children, one child is a crack cocaine addict who often breaks into their house to steal money. One child died of cerebral palsy.     Constitutional: Pt reports headache. Denies fever, malaise, fatigue, or abrupt weight changes.  HEENT: Pt reports eye pain. Denies eye redness, ear pain, ringing in the ears, wax buildup, runny nose, nasal congestion, bloody nose, or sore throat. Neurological: Pt reports dizziness. Denies difficulty with memory, difficulty with speech or problems with balance and coordination.   No other specific complaints in a complete review of systems (except as listed in HPI above).  Objective:   Physical Exam   Wt 141 lb 8 oz (64.184 kg)  LMP 11/14/2013 Wt Readings from Last 3 Encounters:  02/01/14 141 lb 8 oz (64.184 kg)  12/28/13 139 lb (63.05 kg)  12/15/13 139 lb 12.8 oz (63.413 kg)    General: Appears her stated age, lying in a dark room, well developed, well nourished in NAD. Cardiovascular:  Normal rate and rhythm. S1,S2 noted.  No murmur, rubs or gallops noted. No JVD or BLE edema. No carotid bruits noted. Pulmonary/Chest: Normal effort and positive vesicular breath sounds. No respiratory distress. No wheezes, rales or ronchi noted.  Neurological: Alert and oriented. Cranial nerves II-XII intact. Coordination normal. +DTRs bilaterally.  BMET    Component Value Date/Time   NA 138 01/28/2014 0842   K 3.7 01/28/2014 0842   CL 106 01/28/2014 0842   CO2 24 01/28/2014 0842   GLUCOSE 114* 01/28/2014 0842   BUN 15 01/28/2014 0842   CREATININE 0.7 01/28/2014 0842   CALCIUM 8.8 01/28/2014 0842   GFRNONAA >90 12/10/2013 1530   GFRAA >90 12/10/2013 1530    Lipid Panel     Component Value Date/Time   CHOL 165 01/28/2014 0842   TRIG 88.0 01/28/2014 0842   HDL 42.30 01/28/2014 0842   CHOLHDL 4 01/28/2014 0842   VLDL 17.6 01/28/2014 0842   LDLCALC 105* 01/28/2014 0842    CBC    Component Value Date/Time   WBC 8.4 01/28/2014 0842   RBC 3.97 01/28/2014 0842   HGB 12.7 01/28/2014 0842   HCT 38.3 01/28/2014 0842   PLT 446.0* 01/28/2014 0842   MCV 96.3 01/28/2014 0842   MCH 32.8 12/10/2013 1530   MCHC 33.2 01/28/2014 0842   RDW 13.5 01/28/2014 0842   LYMPHSABS 2.3 01/28/2014 0842   MONOABS 0.5 01/28/2014 0842   EOSABS 0.3 01/28/2014 0842   BASOSABS 0.0 01/28/2014 0842    Hgb A1C Lab Results  Component Value Date   HGBA1C 6.0 01/28/2014        Assessment & Plan:

## 2014-02-01 NOTE — Assessment & Plan Note (Signed)
Will treat with toradol/Phenergan IM Go home and try to sleep it off Drink plenty of fluids to avoid dehydration as this will make it worse.

## 2014-02-01 NOTE — Progress Notes (Signed)
Pre-visit discussion using our clinic review tool. No additional management support is needed unless otherwise documented below in the visit note.  

## 2014-02-01 NOTE — Addendum Note (Signed)
Addended by: Lurlean Nanny on: 02/01/2014 04:49 PM   Modules accepted: Orders

## 2014-02-01 NOTE — Patient Instructions (Signed)
Migraine Headache A migraine headache is an intense, throbbing pain on one or both sides of your head. A migraine can last for 30 minutes to several hours. CAUSES  The exact cause of a migraine headache is not always known. However, a migraine may be caused when nerves in the brain become irritated and release chemicals that cause inflammation. This causes pain. Certain things may also trigger migraines, such as:  Alcohol.  Smoking.  Stress.  Menstruation.  Aged cheeses.  Foods or drinks that contain nitrates, glutamate, aspartame, or tyramine.  Lack of sleep.  Chocolate.  Caffeine.  Hunger.  Physical exertion.  Fatigue.  Medicines used to treat chest pain (nitroglycerine), birth control pills, estrogen, and some blood pressure medicines. SIGNS AND SYMPTOMS  Pain on one or both sides of your head.  Pulsating or throbbing pain.  Severe pain that prevents daily activities.  Pain that is aggravated by any physical activity.  Nausea, vomiting, or both.  Dizziness.  Pain with exposure to bright lights, loud noises, or activity.  General sensitivity to bright lights, loud noises, or smells. Before you get a migraine, you may get warning signs that a migraine is coming (aura). An aura may include:  Seeing flashing lights.  Seeing bright spots, halos, or zig-zag lines.  Having tunnel vision or blurred vision.  Having feelings of numbness or tingling.  Having trouble talking.  Having muscle weakness. DIAGNOSIS  A migraine headache is often diagnosed based on:  Symptoms.  Physical exam.  A CT scan or MRI of your head. These imaging tests cannot diagnose migraines, but they can help rule out other causes of headaches. TREATMENT Medicines may be given for pain and nausea. Medicines can also be given to help prevent recurrent migraines.  HOME CARE INSTRUCTIONS  Only take over-the-counter or prescription medicines for pain or discomfort as directed by your  health care provider. The use of long-term narcotics is not recommended.  Lie down in a dark, quiet room when you have a migraine.  Keep a journal to find out what may trigger your migraine headaches. For example, write down:  What you eat and drink.  How much sleep you get.  Any change to your diet or medicines.  Limit alcohol consumption.  Quit smoking if you smoke.  Get 7 9 hours of sleep, or as recommended by your health care provider.  Limit stress.  Keep lights dim if bright lights bother you and make your migraines worse. SEEK IMMEDIATE MEDICAL CARE IF:   Your migraine becomes severe.  You have a fever.  You have a stiff neck.  You have vision loss.  You have muscular weakness or loss of muscle control.  You start losing your balance or have trouble walking.  You feel faint or pass out.  You have severe symptoms that are different from your first symptoms. MAKE SURE YOU:   Understand these instructions.  Will watch your condition.  Will get help right away if you are not doing well or get worse. Document Released: 12/10/2005 Document Revised: 09/30/2013 Document Reviewed: 08/17/2013 [employer] Patient Information 2014 [employer].  

## 2014-02-02 ENCOUNTER — Telehealth: Payer: Self-pay | Admitting: Family Medicine

## 2014-02-02 NOTE — Telephone Encounter (Signed)
Relevant patient education assigned to patient using Emmi. ° °

## 2014-02-04 ENCOUNTER — Encounter: Payer: Self-pay | Admitting: Family Medicine

## 2014-02-04 ENCOUNTER — Ambulatory Visit (INDEPENDENT_AMBULATORY_CARE_PROVIDER_SITE_OTHER): Payer: No Typology Code available for payment source | Admitting: Family Medicine

## 2014-02-04 VITALS — BP 152/80 | HR 78 | Temp 98.2°F | Ht 61.75 in | Wt 143.2 lb

## 2014-02-04 DIAGNOSIS — F411 Generalized anxiety disorder: Secondary | ICD-10-CM

## 2014-02-04 DIAGNOSIS — Z0000 Encounter for general adult medical examination without abnormal findings: Secondary | ICD-10-CM

## 2014-02-04 DIAGNOSIS — R739 Hyperglycemia, unspecified: Secondary | ICD-10-CM

## 2014-02-04 DIAGNOSIS — F17200 Nicotine dependence, unspecified, uncomplicated: Secondary | ICD-10-CM

## 2014-02-04 DIAGNOSIS — R109 Unspecified abdominal pain: Secondary | ICD-10-CM | POA: Insufficient documentation

## 2014-02-04 DIAGNOSIS — G4700 Insomnia, unspecified: Secondary | ICD-10-CM

## 2014-02-04 DIAGNOSIS — R030 Elevated blood-pressure reading, without diagnosis of hypertension: Secondary | ICD-10-CM

## 2014-02-04 DIAGNOSIS — G43909 Migraine, unspecified, not intractable, without status migrainosus: Secondary | ICD-10-CM

## 2014-02-04 MED ORDER — TOPIRAMATE 25 MG PO TABS: 25.0000 mg | ORAL_TABLET | Freq: Two times a day (BID) | ORAL | Status: DC

## 2014-02-04 MED ORDER — OMEPRAZOLE 40 MG PO CPDR: DELAYED_RELEASE_CAPSULE | ORAL | Status: DC

## 2014-02-04 MED ORDER — SUMATRIPTAN SUCCINATE 25 MG PO TABS: 25.0000 mg | ORAL_TABLET | ORAL | Status: DC | PRN

## 2014-02-04 NOTE — Progress Notes (Signed)
Pre-visit discussion using our clinic review tool. No additional management support is needed unless otherwise documented below in the visit note.  

## 2014-02-04 NOTE — Assessment & Plan Note (Signed)
Deteriorated. Start topamax 25 mg qhs for prophylax, imitrex for abortive therapy. Keep HA journal. Call or return to clinic prn if these symptoms worsen or fail to improve as anticipated. The patient indicates understanding of these issues and agrees with the plan.

## 2014-02-04 NOTE — Assessment & Plan Note (Signed)
Will send message to GI to inquire about follow up. Now having bowel changes- ? Colonoscopy?

## 2014-02-04 NOTE — Assessment & Plan Note (Signed)
Asymptomatic and has been controlled.

## 2014-02-04 NOTE — Assessment & Plan Note (Signed)
Quit smoking. 

## 2014-02-04 NOTE — Progress Notes (Signed)
Subjective:   Patient ID: Ashley [name], female    DOB: 1966-01-02, 48 y.o.   MRN: [id number]  Ashley [name] is a pleasant 48 y.o. year old female who presents to clinic today with Annual Exam, Abdominal Pain and Headache  on 02/04/2014  HPI: Has had one abnormal pap in past, since then have been normal.  Sees gynecologist.   Last pap smear done by me on 05/24/2011.  Sexually active with husband only a/ps BTL. No vaginal discharge.  GYN is managing her menorrhagia. Mammogram scheduled for next month.  Saw Webb Silversmith, NP, two days ago for a migraine headache- notes reviewed. Treated with IM toradol and phenergan. Migraines have been worsening since October.  Now getting at least one weekly- associated with sensitivity to light and nausea and vomiting.   RUQ pain- full GI workup (Dr. Sharlett Iles)- notes reviewed. Ultrasound, abdominal CT, upper endoscopy (12/28/2013) all unremarkable.   Even had NM hepatobiliary scan on 12/28/2013 - cystic and common bile ducts patent with normal EF of gallbladder. Still having pain.  Was on Omeprazole 40 mg twice daily but ran out.  She did feel the omeprazole helped with belching but not with the burning epigastric pain.  She is also having changes in her bowel habits- very firm BM with constipation which alternates with diarrhea.  Does take miralax daily for constipation.  BP elevated today but has been normotensive even 2 days ago.  Asymptomatic. BP Readings from Last 3 Encounters:  02/04/14 152/80  02/01/14 130/78  12/28/13 120/62   Hyperglycemia- has been controlled on Metformin but she ran out. Lab Results  Component Value Date   HGBA1C 6.0 01/28/2014   Quit smoking 6 days ago!  Lab Results  Component Value Date   CHOL 165 01/28/2014   HDL 42.30 01/28/2014   LDLCALC 105* 01/28/2014   LDLDIRECT 152.1 01/19/2013   TRIG 88.0 01/28/2014   CHOLHDL 4 01/28/2014   Lab Results  Component Value Date   WBC 8.4 01/28/2014   HGB 12.7 01/28/2014   HCT 38.3 01/28/2014   MCV 96.3 01/28/2014   PLT 446.0* 01/28/2014    Patient Active Problem List   Diagnosis Date Noted  . Routine general medical examination at a health care facility 05/24/2011    Priority: High  . TOBACCO ABUSE 09/30/2009    Priority: High  . INSOMNIA, CHRONIC 09/30/2009    Priority: High  . Abdominal pain, unspecified site 02/04/2014  . Migraine with intractable migraine 10/09/2013  . Hyperglycemia 01/26/2013  . Dyspareunia 01/26/2013  . MIGRAINE HEADACHE 05/30/2010  . ANXIETY 09/30/2009  . ADJUSTMENT DISORDER WITH MIXED FEATURES 09/30/2009  . ELEVATED BLOOD PRESSURE WITHOUT DIAGNOSIS OF HYPERTENSION 09/30/2009   Past Medical History  Diagnosis Date  . Anxiety   . Tobacco abuse   . Diabetes mellitus without complication   . Anemia   . Chronic headaches    Past Surgical History  Procedure Laterality Date  . Appendectomy  1988  . Tubal ligation     History  Substance Use Topics  . Smoking status: Current Every Day Smoker -- 0.50 packs/day  . Smokeless tobacco: Never Used     Comment: Patient taking Chantix--recently quit x 3 days  . Alcohol Use: No   Family History  Problem Relation Age of Onset  . Diabetes Mother   . Hyperlipidemia Mother   . Hypertension Mother    Allergies  Allergen Reactions  . Penicillins Anaphylaxis    REACTION: Throat swells   Current  Outpatient Prescriptions on File Prior to Visit  Medication Sig Dispense Refill  . albuterol (PROVENTIL HFA;VENTOLIN HFA) 108 (90 BASE) MCG/ACT inhaler Inhale 2 puffs into the lungs every 6 (six) hours as needed for wheezing.      . clonazePAM (KLONOPIN) 1 MG tablet Take 1 tablet (1 mg total) by mouth 2 (two) times daily.  60 tablet  0  . Estradiol 10 MCG TABS vaginal tablet Place 1 tablet vaginally See admin instructions. Takes Tuesday and Friday at bedtime      . metFORMIN (GLUCOPHAGE) 500 MG tablet Take 1 tablet (500 mg total) by mouth daily with breakfast.  90 tablet  1  . norethindrone (ERRIN) 0.35 MG  tablet Take 1 tablet by mouth daily.      . polyethylene glycol powder (GLYCOLAX/MIRALAX) powder Take 17 g by mouth 2 (two) times daily. Until daily soft stools  OTC  255 g  0  . promethazine (PHENERGAN) 25 MG tablet Take 1 tab by mouth every 6 hours as needed for nausea  30 tablet  0  . traZODone (DESYREL) 150 MG tablet Take 1 tablet (150 mg total) by mouth at bedtime.  90 tablet  0  . dicyclomine (BENTYL) 20 MG tablet Take 1 tablet (20 mg total) by mouth 2 (two) times daily.  20 tablet  0  . traMADol (ULTRAM) 50 MG tablet Take 1 tab every 6-8 hours as needed for pain  30 tablet  0   No current facility-administered medications on file prior to visit.   The PMH, PSH, Social History, Family History, Medications, and allergies have been reviewed in [facility], and have been updated if relevant.  Review of Systems  Constitutional: Negative for fever, activity change, appetite change and fatigue.  Respiratory: Negative for apnea, cough, chest tightness and shortness of breath.   Cardiovascular: Negative for chest pain.  Gastrointestinal: Positive for nausea, abdominal pain, diarrhea and constipation. Negative for blood in stool.  Genitourinary: Positive for menstrual problem.  Neurological: Negative.   Hematological: Negative.   Psychiatric/Behavioral: Negative for hallucinations, behavioral problems, confusion, dysphoric mood, decreased concentration and agitation. The patient is not hyperactive.   All other systems reviewed and are negative.      Objective:    BP 152/80  Pulse 78  Temp(Src) 98.2 F (36.8 C) (Oral)  Ht 5' 1.75" (1.568 m)  Wt 143 lb 4 oz (64.978 kg)  BMI 26.43 kg/m2  SpO2 98%  LMP 11/14/2013   Physical Exam  Constitutional: She is oriented to person, place, and time. She appears well-developed and well-nourished.  Eyes: Pupils are equal, round, and reactive to light.  Neck: Normal range of motion.  Cardiovascular: Normal rate, regular rhythm and normal heart  sounds.   Pulmonary/Chest: Effort normal and breath sounds normal.  Abdominal: Soft. She exhibits no mass. There is no tenderness. There is no rebound and no guarding.  Musculoskeletal: Normal range of motion.  Neurological: She is alert and oriented to person, place, and time. She has normal reflexes.  Skin: Skin is warm and dry.  Psychiatric: She has a normal mood and affect. Her behavior is normal. Judgment normal.           Assessment & Plan:   Routine general medical examination at a health care facility  [place], CHRONIC  Abdominal pain, unspecified site  ELEVATED BLOOD PRESSURE WITHOUT DIAGNOSIS OF HYPERTENSION No Follow-up on file.

## 2014-02-04 NOTE — Assessment & Plan Note (Signed)
Reviewed preventive care protocols, scheduled due services, and updated immunizations Discussed nutrition, exercise, diet, and healthy lifestyle.  

## 2014-02-04 NOTE — Patient Instructions (Addendum)
We are starting Topamax 25 mg at bedtime to prevent migraines. Take Imitrex as needed (and directed) for migraines. Keep a headache journal and update me.  Ok to restart Metformin.

## 2014-02-05 ENCOUNTER — Encounter: Payer: Self-pay | Admitting: *Deleted

## 2014-02-05 ENCOUNTER — Telehealth: Payer: Self-pay | Admitting: Family Medicine

## 2014-02-05 NOTE — Telephone Encounter (Signed)
Relevant patient education assigned to patient using Emmi. ° °

## 2014-02-08 ENCOUNTER — Telehealth: Payer: Self-pay | Admitting: Family Medicine

## 2014-02-08 MED ORDER — AMITRIPTYLINE HCL 10 MG PO TABS: 10.0000 mg | ORAL_TABLET | Freq: Every day | ORAL | Status: DC

## 2014-02-08 NOTE — Telephone Encounter (Signed)
Yes that can certainly be a side effect of topamax. Will d/c topamax and start elavil.  Rx sent.

## 2014-02-08 NOTE — Telephone Encounter (Signed)
Patient started Topamax 02/06/14 for reduction of headaches.  States after 2 doses, notes tingling in her fingers and hands bilaterally.  Declines triage; denies pain, color change, neck problems, back pain, or other emergent symptoms.  As this is a side effect of Topamax per patient insert, she would like Dr. Deborra Medina to know and consider change in Rx; info to office for provider review/callback.  May reach patient at [phone].  krs/can

## 2014-02-08 NOTE — Telephone Encounter (Signed)
Spoke to pt and advised per Dr Deborra Medina. She states that she will d/c medication and start new Rx.

## 2014-02-12 ENCOUNTER — Ambulatory Visit (INDEPENDENT_AMBULATORY_CARE_PROVIDER_SITE_OTHER): Payer: No Typology Code available for payment source | Admitting: Physician Assistant

## 2014-02-12 ENCOUNTER — Encounter: Payer: Self-pay | Admitting: Physician Assistant

## 2014-02-12 VITALS — BP 120/60 | HR 66 | Ht 61.75 in | Wt 144.0 lb

## 2014-02-12 DIAGNOSIS — R198 Other specified symptoms and signs involving the digestive system and abdomen: Secondary | ICD-10-CM

## 2014-02-12 DIAGNOSIS — R109 Unspecified abdominal pain: Secondary | ICD-10-CM

## 2014-02-12 DIAGNOSIS — K219 Gastro-esophageal reflux disease without esophagitis: Secondary | ICD-10-CM

## 2014-02-12 DIAGNOSIS — K5900 Constipation, unspecified: Secondary | ICD-10-CM

## 2014-02-12 DIAGNOSIS — R194 Change in bowel habit: Secondary | ICD-10-CM

## 2014-02-12 MED ORDER — DICYCLOMINE HCL 10 MG PO CAPS: ORAL_CAPSULE | ORAL | Status: DC

## 2014-02-12 NOTE — Progress Notes (Addendum)
Subjective:    Patient ID: Ashley [name], female    DOB: 09/02/1966, 48 y.o.   MRN: [id number]  HPI  Samreen is a pleasant 48 year old white female who has recently established in the office for complaints of upper abdominal pain. She is status post appendectomy tubal ligation and has history of chronic anxiety and migraines. Workup thus far with CT of the abdomen and pelvis showed a tiny gallbladder polyp. She underwent upper endoscopy with Dr. Sharlett Iles on 12/28/2013 and was found to have mild duodenitis. Biopsies were taken from the small bowel were negative for celiac disease she was H. pylori negative. She has since had a [facility]CK HIDA scan with a normal ejection fraction of 93%. She is currently being maintained on omeprazole 40 mg twice daily and has been taking MiraLax for constipation. She comes in today for followup and states that her upper abdominal  symptoms have improved in that she's not having nearly as much reflux or epigastric discomfort. She has been having some intermittent sharp pains bilaterally in her lower abdomen and has noted a change in her bowel habits. She says even with MiraLax on a daily basis she is having some days a very hard almost impacted stool alternating with days of looser stools. She has not noted any melena or hematochezia. Her appetite has been fine and her weight is stable.    Review of Systems  Constitutional: Negative.   HENT: Negative.   Eyes: Negative.   Respiratory: Negative.   Cardiovascular: Negative.   Gastrointestinal: Positive for abdominal pain and constipation.  Endocrine: Negative.   Genitourinary: Negative.   Musculoskeletal: Negative.   Skin: Negative.   Allergic/Immunologic: Negative.   Neurological: Negative.   Hematological: Negative.   Psychiatric/Behavioral: Negative.    Outpatient Prescriptions Prior to Visit  Medication Sig Dispense Refill  . albuterol (PROVENTIL HFA;VENTOLIN HFA) 108 (90 BASE) MCG/ACT inhaler Inhale 2 puffs into  the lungs every 6 (six) hours as needed for wheezing.      Marland Kitchen amitriptyline (ELAVIL) 10 MG tablet Take 1 tablet (10 mg total) by mouth at bedtime.  30 tablet  1  . clonazePAM (KLONOPIN) 1 MG tablet Take 1 tablet (1 mg total) by mouth 2 (two) times daily.  60 tablet  0  . Estradiol 10 MCG TABS vaginal tablet Place 1 tablet vaginally See admin instructions. Takes Tuesday and Friday at bedtime      . metFORMIN (GLUCOPHAGE) 500 MG tablet Take 1 tablet (500 mg total) by mouth daily with breakfast.  90 tablet  1  . norethindrone (ERRIN) 0.35 MG tablet Take 1 tablet by mouth daily.      Marland Kitchen omeprazole (PRILOSEC) 40 MG capsule Take 1 tab twice daily for one month then go to once daily  180 capsule  3  . polyethylene glycol powder (GLYCOLAX/MIRALAX) powder Take 17 g by mouth 2 (two) times daily. Until daily soft stools  OTC  255 g  0  . promethazine (PHENERGAN) 25 MG tablet Take 1 tab by mouth every 6 hours as needed for nausea  30 tablet  0  . SUMAtriptan (IMITREX) 25 MG tablet Take 1 tablet (25 mg total) by mouth every 2 (two) hours as needed for migraine or headache. May repeat in 2 hours if headache persists or recurs.  10 tablet  0  . traZODone (DESYREL) 150 MG tablet Take 1 tablet (150 mg total) by mouth at bedtime.  90 tablet  0  . dicyclomine (BENTYL) 20 MG tablet Take  1 tablet (20 mg total) by mouth 2 (two) times daily.  20 tablet  0  . topiramate (TOPAMAX) 25 MG tablet Take 1 tablet (25 mg total) by mouth 2 (two) times daily.  30 tablet  3  . traMADol (ULTRAM) 50 MG tablet Take 1 tab every 6-8 hours as needed for pain  30 tablet  0   No facility-administered medications prior to visit.   Allergies  Allergen Reactions  . Penicillins Anaphylaxis    REACTION: Throat swells  . Topamax [Topiramate]     Hands and feet to go numb   Patient Active Problem List   Diagnosis Date Noted  . Abdominal pain, unspecified site 02/04/2014  . Migraine with intractable migraine 10/09/2013  . Hyperglycemia  01/26/2013  . Dyspareunia 01/26/2013  . Routine general medical examination at a health care facility 05/24/2011  . MIGRAINE HEADACHE 05/30/2010  . ANXIETY 09/30/2009  . TOBACCO ABUSE 09/30/2009  . INSOMNIA, CHRONIC 09/30/2009  . ADJUSTMENT DISORDER WITH MIXED FEATURES 09/30/2009  . ELEVATED BLOOD PRESSURE WITHOUT DIAGNOSIS OF HYPERTENSION 09/30/2009   History  Substance Use Topics  . Smoking status: Former Smoker -- 0.50 packs/day for 35 years    Quit date: 02/02/2014  . Smokeless tobacco: Never Used     Comment: Patient taking Chantix--recently quit x 3 days  . Alcohol Use: No   family history includes Diabetes in her mother; Hyperlipidemia in her mother; Hypertension in her mother; Prostate cancer in her father. There is no history of Colon cancer.     Objective:   Physical Exam  and well-developed white female in no acute distress, pleasant blood pressure 120/60 pulse 66 height 5 foot 1 weight 144. HEENT; nontraumatic normocephalic EOMI PERRLA sclera anicteric, Supple ;no JVD, Cardiovascular; regular rate and rhythm with S1-S2 no murmur or gallop, Pulmonary; clear bilaterally, Abdomen; soft mildly tender in the right lower quadrant right mid quadrant is no guarding or rebound no palpable mass or hepatosplenomegaly bowel sounds are present, Rectal ;exam not done, Extremities; no clubbing cyanosis or edema skin warm and dry, Psych; mood and affect appropriate        Assessment & Plan:  #68  48 year old female with epigastric pain which may have been secondary to duodenitis improving on twice a day PPI  #2 GERD #3 change in bowel habits with alternating loose stools and constipation and mid abdominal pain more prominent on the right. Symptoms may be functional/IBS in nature however she has not had colonoscopy. #4 status post appendectomy #5 anxiety  Plan; continue Prilosec 40 mg by mouth twice daily for 1 more month and then once daily thereafter Continue MiraLax 17 g in 8 ounces  of water daily she was advised that she could take 2 doses daily as needed and/or add a stool softener as needed Trial of Bentyl 10 mg by mouth twice daily for abdominal discomfort/cramping Schedule for colonoscopy with Dr. Hilarie Fredrickson. Procedure discussed in detail with the patient and she is agreeable to proceed.  Addendum: Reviewed and agree with initial management. Jerene Bears, MD

## 2014-02-12 NOTE — Patient Instructions (Signed)
We sent a prescrption to [facility]. 1. Bentyl ( Dicyclomine )  Stay on Miralax daily.  Stay on Prilosec twice daily.  You have been scheduled for a colonoscopy with propofol. Please follow written instructions given to you at your visit today.  You may use the Suprep colonoscopy prep you have at home. Instructions provided today.

## 2014-02-16 ENCOUNTER — Encounter: Payer: Self-pay | Admitting: Internal Medicine

## 2014-02-16 ENCOUNTER — Ambulatory Visit (AMBULATORY_SURGERY_CENTER): Payer: No Typology Code available for payment source | Admitting: Internal Medicine

## 2014-02-16 VITALS — BP 120/57 | HR 61 | Temp 99.1°F | Resp 16 | Ht 61.75 in | Wt 144.0 lb

## 2014-02-16 DIAGNOSIS — R194 Change in bowel habit: Secondary | ICD-10-CM

## 2014-02-16 DIAGNOSIS — R198 Other specified symptoms and signs involving the digestive system and abdomen: Secondary | ICD-10-CM

## 2014-02-16 DIAGNOSIS — R109 Unspecified abdominal pain: Secondary | ICD-10-CM

## 2014-02-16 HISTORY — PX: COLONOSCOPY: SHX174

## 2014-02-16 LAB — HM COLONOSCOPY

## 2014-02-16 LAB — GLUCOSE, CAPILLARY
Glucose-Capillary: 105 mg/dL — ABNORMAL HIGH (ref 70–99)
Glucose-Capillary: 96 mg/dL (ref 70–99)

## 2014-02-16 MED ORDER — SODIUM CHLORIDE 0.9 % IV SOLN
500.0000 mL | INTRAVENOUS | Status: DC
Start: 2014-02-16 — End: 2014-02-16

## 2014-02-16 NOTE — Patient Instructions (Signed)
YOU HAD AN ENDOSCOPIC PROCEDURE TODAY AT THE [facility]: Refer to the procedure report that was given to you for any specific questions about what was found during the examination.  If the procedure report does not answer your questions, please call your gastroenterologist to clarify.  If you requested that your care partner not be given the details of your procedure findings, then the procedure report has been included in a sealed envelope for you to review at your convenience later.  YOU SHOULD EXPECT: Some feelings of bloating in the abdomen. Passage of more gas than usual.  Walking can help get rid of the air that was put into your GI tract during the procedure and reduce the bloating. If you had a lower endoscopy (such as a colonoscopy or flexible sigmoidoscopy) you may notice spotting of blood in your stool or on the toilet paper. If you underwent a bowel prep for your procedure, then you may not have a normal bowel movement for a few days.  DIET: Your first meal following the procedure should be a light meal and then it is ok to progress to your normal diet.  A half-sandwich or bowl of soup is an example of a good first meal.  Heavy or fried foods are harder to digest and may make you feel nauseous or bloated.  Likewise meals heavy in dairy and vegetables can cause extra gas to form and this can also increase the bloating.  Drink plenty of fluids but you should avoid alcoholic beverages for 24 hours.  ACTIVITY: Your care partner should take you home directly after the procedure.  You should plan to take it easy, moving slowly for the rest of the day.  You can resume normal activity the day after the procedure however you should NOT DRIVE or use heavy machinery for 24 hours (because of the sedation medicines used during the test).    SYMPTOMS TO REPORT IMMEDIATELY: A gastroenterologist can be reached at any hour.  During normal business hours, 8:30 AM to 5:00 PM Monday through Friday,  call [phone].  After hours and on weekends, please call the GI answering service at [phone] who will take a message and have the physician on call contact you.   Following lower endoscopy (colonoscopy or flexible sigmoidoscopy):  Excessive amounts of blood in the stool  Significant tenderness or worsening of abdominal pains  Swelling of the abdomen that is new, acute  Fever of 100F or higher    FOLLOW UP: If any biopsies were taken you will be contacted by phone or by letter within the next 1-3 weeks.  Call your gastroenterologist if you have not heard about the biopsies in 3 weeks.  Our staff will call the home number listed on your records the next business day following your procedure to check on you and address any questions or concerns that you may have at that time regarding the information given to you following your procedure. This is a courtesy call and so if there is no answer at the home number and we have not heard from you through the emergency physician on call, we will assume that you have returned to your regular daily activities without incident.  SIGNATURES/CONFIDENTIALITY: You and/or your care partner have signed paperwork which will be entered into your electronic medical record.  These signatures attest to the fact that that the information above on your After Visit Summary has been reviewed and is understood.  Full responsibility of the confidentiality   of this discharge information lies with you and/or your care-partner.     

## 2014-02-16 NOTE — Progress Notes (Signed)
No egg or soy allergy. ewm No problems with past sedation. ewm

## 2014-02-16 NOTE — Op Note (Signed)
[place]  [address]. [place], [address]   COLONOSCOPY PROCEDURE REPORT  PATIENT: Ashley, [name]  MR#: [id number] BIRTHDATE: [birth year] , 48  yrs. old GENDER: Female ENDOSCOPIST: Jerene Bears, MD REFERRED BY:Amy Delafield, PA-C PROCEDURE DATE:  02/16/2014 PROCEDURE:   Colonoscopy, diagnostic First Screening Colonoscopy - Avg.  risk and is 50 yrs.  old or older - No.  Prior Negative Screening - Now for repeat screening. N/A  History of Adenoma - Now for follow-up colonoscopy & has been > or = to 3 yrs.  N/A  Polyps Removed Today? No.  Recommend repeat exam, <10 yrs? No. ASA CLASS:   Class II INDICATIONS:Change in bowel habits.   Lower abd pain (bilateral) MEDICATIONS: MAC sedation, administered by CRNA, Propofol (Diprivan), and propofol (Diprivan) 300mg  IV  DESCRIPTION OF PROCEDURE:   After the risks benefits and alternatives of the procedure were thoroughly explained, informed consent was obtained.  A digital rectal exam revealed no rectal mass.   The LB [id number]-[id number]  endoscope was introduced through the anus and advanced to the terminal ileum which was intubated for a short distance. No adverse events experienced.   The quality of the prep was good, using MoviPrep  The instrument was then slowly withdrawn as the colon was fully examined.      COLON FINDINGS: The mucosa appeared normal in the terminal ileum. Mild diverticulosis was noted at the cecum and in the ascending colon.   The colon mucosa was otherwise normal.  Retroflexed views revealed no abnormalities. The time to cecum=2 minutes 54 seconds. Withdrawal time=9 minutes 44 seconds.  The scope was withdrawn and the procedure completed.  COMPLICATIONS: There were no complications.   ENDOSCOPIC IMPRESSION: 1.   Normal mucosa in the terminal ileum 2.   Mild diverticulosis was noted at the cecum and in the ascending colon 3.   The colon mucosa was otherwise normal  RECOMMENDATIONS: 1.   Trial of Linzess 145 mcg daily for constipation 2.  High fiber diet 3.  GYN consultation to rule out other causes of pelvic pain (referral if needed) 4.  Office followup in 4-6 weeks.  If symptoms persist would consider recta manometry   eSigned:  Jerene Bears, MD 02/16/2014 10:19 AM   cc: The Patient and Arnette Norris MD   PATIENT NAME:  Ashley, [name] MR#: [id number]

## 2014-02-17 ENCOUNTER — Telehealth: Payer: Self-pay | Admitting: *Deleted

## 2014-02-17 NOTE — Telephone Encounter (Signed)
  Follow up Call-  Call back number 02/16/2014 12/28/2013  Post procedure Call Back phone  # [phone]  Permission to leave phone message Yes Yes     Patient questions:  Do you have a fever, pain , or abdominal swelling? no Pain Score  0 *  Have you tolerated food without any problems? yes  Have you been able to return to your normal activities? yes  Do you have any questions about your discharge instructions: Diet   no Medications  no Follow up visit  no  Do you have questions or concerns about your Care? no  Actions: * If pain score is 4 or above: No action needed, pain <4.

## 2014-02-19 ENCOUNTER — Other Ambulatory Visit: Payer: Self-pay

## 2014-02-19 MED ORDER — PROMETHAZINE HCL 25 MG PO TABS: ORAL_TABLET | ORAL | Status: DC

## 2014-02-19 NOTE — Telephone Encounter (Signed)
Pt [facility]; pt has nausea on and off when has migraine h/a. Pt request cb when filled.

## 2014-02-22 ENCOUNTER — Other Ambulatory Visit: Payer: Self-pay | Admitting: *Deleted

## 2014-02-22 MED ORDER — TRAZODONE HCL 150 MG PO TABS: 150.0000 mg | ORAL_TABLET | Freq: Every day | ORAL | Status: DC

## 2014-03-01 ENCOUNTER — Other Ambulatory Visit: Payer: Self-pay | Admitting: Obstetrics and Gynecology

## 2014-03-01 DIAGNOSIS — N6452 Nipple discharge: Secondary | ICD-10-CM

## 2014-03-02 ENCOUNTER — Other Ambulatory Visit: Payer: Self-pay

## 2014-03-02 MED ORDER — CLONAZEPAM 1 MG PO TABS: 1.0000 mg | ORAL_TABLET | Freq: Two times a day (BID) | ORAL | Status: DC

## 2014-03-02 NOTE — Telephone Encounter (Signed)
Pt left v/m requesting clonazepam refill [facility]. Please advise.

## 2014-03-02 NOTE — Telephone Encounter (Signed)
Spoke to pt and informed Rx has been called in to requested pharmacy

## 2014-03-09 ENCOUNTER — Ambulatory Visit
Admission: RE | Admit: 2014-03-09 | Discharge: 2014-03-09 | Disposition: A | Discharge status: Home / Self Care | Payer: No Typology Code available for payment source | Source: Ambulatory Visit | Attending: Obstetrics and Gynecology | Admitting: Obstetrics and Gynecology

## 2014-03-09 DIAGNOSIS — N6452 Nipple discharge: Secondary | ICD-10-CM

## 2014-03-15 ENCOUNTER — Telehealth: Payer: Self-pay | Admitting: Family Medicine

## 2014-03-15 ENCOUNTER — Ambulatory Visit: Payer: No Typology Code available for payment source | Admitting: Internal Medicine

## 2014-03-15 NOTE — Telephone Encounter (Signed)
PCP is out of office.  Please advise.

## 2014-03-15 NOTE — Telephone Encounter (Signed)
Spoke to pt and advised per Dr Letvak 

## 2014-03-15 NOTE — Telephone Encounter (Signed)
Patient Information:  Caller Name: Nasim  Phone: [phone]  Patient: Ashley [name], Ashley [name]  Gender: Female  DOB: Feb 05, 1966  Age: 48 Years  PCP: Arnette Norris [facility])  Pregnant: No  Office Follow Up:  Does the office need to follow up with this patient?: No  Instructions For The Office: N/A  RN Note:  Pt declines offered appt due to transportation.  She request that PCP advise additional treatment.  Pt does have phenergan PO at home.  She states that it does not work as well as injection.  Symptoms  Reason For Call & Symptoms: Pt reports that she has a Migraine.  Has taken Imitrex x 2 on 03/14/14.  Pt states that she made appt this am for 14:00 and had to cancel appt due to no transportation.  She is calling to see what other options she has since she can not get to office today  HA, right side, temporal, nausea, staying in dark room.  HA is described as severe.  Pt reports that vision is fuzzy.  Both eyes are Fuzzy and she states that this is normal for her migraines.  Reviewed Health History In EMR: Yes  Reviewed Medications In EMR: Yes  Reviewed Allergies In EMR: Yes  Reviewed Surgeries / Procedures: Yes  Date of Onset of Symptoms: 03/14/2014 OB / GYN:  LMP: Unknown  Guideline(s) Used:  Headache  Disposition Per Guideline:   Callback by PCP or Subspecialist within 1 Hour  Reason For Disposition Reached:   Severe headache and has had severe headaches before  Advice Given:  Call Back If:  You become worse.  Patient Refused Recommendation:  Patient Requests Prescription  Pt request that PCP reviews note

## 2014-03-15 NOTE — Telephone Encounter (Signed)
Best bet if she can't come in is to try the phenergan along with 2 aleve or 4 ibuprofen

## 2014-03-23 ENCOUNTER — Encounter: Payer: Self-pay | Admitting: Family Medicine

## 2014-03-23 ENCOUNTER — Ambulatory Visit (INDEPENDENT_AMBULATORY_CARE_PROVIDER_SITE_OTHER): Payer: No Typology Code available for payment source | Admitting: Family Medicine

## 2014-03-23 VITALS — BP 120/70 | HR 73 | Temp 98.4°F | Ht 61.75 in | Wt 147.0 lb

## 2014-03-23 DIAGNOSIS — G43919 Migraine, unspecified, intractable, without status migrainosus: Secondary | ICD-10-CM

## 2014-03-23 DIAGNOSIS — G43909 Migraine, unspecified, not intractable, without status migrainosus: Secondary | ICD-10-CM

## 2014-03-23 MED ORDER — KETOROLAC TROMETHAMINE 60 MG/2ML IM SOLN
60.0000 mg | Freq: Once | INTRAMUSCULAR | Status: AC
  Administered 2014-03-23: 60 mg via INTRAMUSCULAR

## 2014-03-23 MED ORDER — PROMETHAZINE HCL 25 MG/ML IJ SOLN
25.0000 mg | Freq: Once | INTRAMUSCULAR | Status: AC
Start: 2014-03-23 — End: 2014-03-23
  Administered 2014-03-23: 25 mg via INTRAMUSCULAR

## 2014-03-23 NOTE — Progress Notes (Signed)
Pre visit review using our clinic review tool, if applicable. No additional management support is needed unless otherwise documented below in the visit note. 

## 2014-03-23 NOTE — Assessment & Plan Note (Signed)
Treat with toradol and phenergan injection.  Push fluids, rest, avoid triggers.

## 2014-03-23 NOTE — Progress Notes (Signed)
Subjective:    Patient ID: Ashley [name], female    DOB: 07-Aug-1966, 48 y.o.   MRN: [id number]  Migraine  Pertinent negatives include no abdominal pain, ear pain, eye pain, fever, sinus pressure or sore throat.    48 year old female with history of migraine presents with an intractable migraine ongoing in last week. Right temple, throbbing, like a piercing knife, severe.  Nausea and photophobia.  She has tried sumatriptan multiple times without relief. No new neuro changes, like weakness and numbness. Blurry vision at times. This headache feels typical of her migraines.  Having monthly migraines She feels trigger is OCPs/menses and hormones and she plans to stop these after upcoming hysterectomy.   LAst intractable migraine responded to toradol and phenergan.    Review of Systems  Constitutional: Positive for fatigue. Negative for fever.  HENT: Negative for ear pain, sinus pressure and sore throat.   Eyes: Negative for pain.  Respiratory: Negative for chest tightness and shortness of breath.   Cardiovascular: Negative for chest pain, palpitations and leg swelling.  Gastrointestinal: Negative for abdominal pain.  Genitourinary: Negative for dysuria.       Objective:   Physical Exam  Constitutional: She is oriented to person, place, and time. Vital signs are normal. She appears well-developed and well-nourished. She is cooperative.  Non-toxic appearance. She does not appear ill. No distress.  HENT:  Head: Normocephalic.  Right Ear: Hearing, tympanic membrane, external ear and ear canal normal. Tympanic membrane is not erythematous, not retracted and not bulging.  Left Ear: Hearing, tympanic membrane, external ear and ear canal normal. Tympanic membrane is not erythematous, not retracted and not bulging.  Nose: No mucosal edema or rhinorrhea. Right sinus exhibits no maxillary sinus tenderness and no frontal sinus tenderness. Left sinus exhibits no maxillary sinus tenderness and  no frontal sinus tenderness.  Mouth/Throat: Uvula is midline, oropharynx is clear and moist and mucous membranes are normal.  Eyes: Conjunctivae, EOM and lids are normal. Pupils are equal, round, and reactive to light. Lids are everted and swept, no foreign bodies found.  Neck: Trachea normal and normal range of motion. Neck supple. Carotid bruit is not present. No mass and no thyromegaly present.  Cardiovascular: Normal rate, regular rhythm, S1 normal, S2 normal, normal heart sounds, intact distal pulses and normal pulses.  Exam reveals no gallop and no friction rub.   No murmur heard. Pulmonary/Chest: Effort normal and breath sounds normal. Not tachypneic. No respiratory distress. She has no decreased breath sounds. She has no wheezes. She has no rhonchi. She has no rales.  Abdominal: Soft. Normal appearance and bowel sounds are normal. There is no tenderness.  Neurological: She is alert and oriented to person, place, and time. She has normal strength and normal reflexes. No cranial nerve deficit or sensory deficit. She exhibits normal muscle tone. She displays a negative Romberg sign. Coordination and gait normal. GCS eye subscore is 4. GCS verbal subscore is 5. GCS motor subscore is 6.  Nml cerebellar exam   No papilledema  Skin: Skin is warm, dry and intact. No rash noted.  Psychiatric: She has a normal mood and affect. Her speech is normal and behavior is normal. Judgment and thought content normal. Her mood appears not anxious. Cognition and memory are normal. Cognition and memory are not impaired. She does not exhibit a depressed mood. She exhibits normal recent memory and normal remote memory.          Assessment & Plan:

## 2014-03-23 NOTE — Addendum Note (Signed)
Addended by: Carter Kitten on: 03/23/2014 02:51 PM   Modules accepted: Orders

## 2014-03-23 NOTE — Patient Instructions (Signed)
Call if not improving as expected.  Push fluids, rest.

## 2014-03-29 ENCOUNTER — Ambulatory Visit (INDEPENDENT_AMBULATORY_CARE_PROVIDER_SITE_OTHER): Payer: No Typology Code available for payment source | Admitting: Podiatry

## 2014-03-29 ENCOUNTER — Encounter: Payer: Self-pay | Admitting: Podiatry

## 2014-03-29 VITALS — BP 124/68 | HR 73 | Resp 16 | Ht 62.0 in | Wt 147.0 lb

## 2014-03-29 DIAGNOSIS — M7660 Achilles tendinitis, unspecified leg: Secondary | ICD-10-CM

## 2014-03-29 DIAGNOSIS — L600 Ingrowing nail: Secondary | ICD-10-CM

## 2014-03-29 NOTE — Patient Instructions (Addendum)
Betadine Soak Instructions  Purchase an 8 oz. bottle of BETADINE solution (Povidone)  THE DAY AFTER THE PROCEDURE  Place 1 tablespoon of betadine solution in a quart of warm tap water.  Submerge your foot or feet with outer bandage intact for the initial soak; this will allow the bandage to become moist and wet for easy lift off.  Once you remove your bandage, continue to soak in the solution for 20 minutes.  This soak should be done twice a day.  Next, remove your foot or feet from solution, blot dry the affected area and cover.  You may use a band aid large enough to cover the area or use gauze and tape.  Apply other medications to the area as directed by the doctor such as cortisporin otic solution (ear drops) or neosporin.  IF YOUR SKIN BECOMES IRRITATED WHILE USING THESE INSTRUCTIONS, IT IS OKAY TO SWITCH TO EPSOM SALTS AND WATER OR WHITE VINEGAR AND WATER.[facility] Instructions-Post Nail Surgery  You have had your ingrown toenail and root treated with a chemical.  This chemical causes a burn that will drain and ooze like a blister.  This can drain for 6-8 weeks or longer.  It is important to keep this area clean, covered, and follow the soaking instructions dispensed at the time of your surgery.  This area will eventually dry and form a scab.  Once the scab forms you no longer need to soak or apply a dressing.  If at any time you experience an increase in pain, redness, swelling, or drainage, you should contact the office as soon as possible.Achilles Tendinitis  with Rehab Achilles tendinitis is a disorder of the Achilles tendon. The Achilles tendon connects the large calf muscles (Gastrocnemius and Soleus) to the heel bone (calcaneus). This tendon is sometimes called the heel cord. It is important for pushing-off and standing on your toes and is important for walking, running, or jumping. Tendinitis is often caused by overuse and repetitive microtrauma. SYMPTOMS  Pain, tenderness,  swelling, warmth, and redness may occur over the Achilles tendon even at rest.  Pain with pushing off, or flexing or extending the ankle.  Pain that is worsened after or during activity. CAUSES   Overuse sometimes seen with rapid increase in exercise programs or in sports requiring running and jumping.  Poor physical conditioning (strength and flexibility or endurance).  Running sports, especially training running down hills.  Inadequate warm-up before practice or play or failure to stretch before participation.  Injury to the tendon. PREVENTION   Warm up and stretch before practice or competition.  Allow time for adequate rest and recovery between practices and competition.  Keep up conditioning.  Keep up ankle and leg flexibility.  Improve or keep muscle strength and endurance.  Improve cardiovascular fitness.  Use proper technique.  Use proper equipment (shoes, skates).  To help prevent recurrence, taping, protective strapping, or an adhesive bandage may be recommended for several weeks after healing is complete. PROGNOSIS   Recovery may take weeks to several months to heal.  Longer recovery is expected if symptoms have been prolonged.  Recovery is usually quicker if the inflammation is due to a direct blow as compared with overuse or sudden strain. RELATED COMPLICATIONS   Healing time will be prolonged if the condition is not correctly treated. The injury must be given plenty of time to heal.  Symptoms can reoccur if activity is resumed too soon.  Untreated, tendinitis may increase the risk of tendon rupture  requiring additional time for recovery and possibly surgery. TREATMENT   The first treatment consists of rest anti-inflammatory medication, and ice to relieve the pain.  Stretching and strengthening exercises after resolution of pain will likely help reduce the risk of recurrence. Referral to a physical therapist or athletic trainer for further evaluation  and treatment may be helpful.  A walking boot or cast may be recommended to rest the Achilles tendon. This can help break the cycle of inflammation and microtrauma.  Arch supports (orthotics) may be prescribed or recommended by your caregiver as an adjunct to therapy and rest.  Surgery to remove the inflamed tendon lining or degenerated tendon tissue is rarely necessary and has shown less than predictable results. MEDICATION   Nonsteroidal anti-inflammatory medications, such as aspirin and ibuprofen, may be used for pain and inflammation relief. Do not take within 7 days before surgery. Take these as directed by your caregiver. Contact your caregiver immediately if any bleeding, stomach upset, or signs of allergic reaction occur. Other minor pain relievers, such as acetaminophen, may also be used.  Pain relievers may be prescribed as necessary by your caregiver. Do not take prescription pain medication for longer than 4 to 7 days. Use only as directed and only as much as you need.  Cortisone injections are rarely indicated. Cortisone injections may weaken tendons and predispose to rupture. It is better to give the condition more time to heal than to use them. HEAT AND COLD  Cold is used to relieve pain and reduce inflammation for acute and chronic Achilles tendinitis. Cold should be applied for 10 to 15 minutes every 2 to 3 hours for inflammation and pain and immediately after any activity that aggravates your symptoms. Use ice packs or an ice massage.  Heat may be used before performing stretching and strengthening activities prescribed by your caregiver. Use a heat pack or a warm soak. SEEK MEDICAL CARE IF:  Symptoms get worse or do not improve in 2 weeks despite treatment.  New, unexplained symptoms develop. Drugs used in treatment may produce side effects. EXERCISES RANGE OF MOTION (ROM) AND STRETCHING EXERCISES - Achilles Tendinitis  These exercises may help you when beginning to  rehabilitate your injury. Your symptoms may resolve with or without further involvement from your physician, physical therapist or athletic trainer. While completing these exercises, remember:   Restoring tissue flexibility helps normal motion to return to the joints. This allows healthier, less painful movement and activity.  An effective stretch should be held for at least 30 seconds.  A stretch should never be painful. You should only feel a gentle lengthening or release in the stretched tissue. STRETCH  Gastroc, Standing   Place hands on wall.  Extend right / left leg, keeping the front knee somewhat bent.  Slightly point your toes inward on your back foot.  Keeping your right / left heel on the floor and your knee straight, shift your weight toward the wall, not allowing your back to arch.  You should feel a gentle stretch in the right / left calf. Hold this position for __________ seconds. Repeat __________ times. Complete this stretch __________ times per day. STRETCH  Soleus, Standing   Place hands on wall.  Extend right / left leg, keeping the other knee somewhat bent.  Slightly point your toes inward on your back foot.  Keep your right / left heel on the floor, bend your back knee, and slightly shift your weight over the back leg so that you feel  a gentle stretch deep in your back calf.  Hold this position for __________ seconds. Repeat __________ times. Complete this stretch __________ times per day. STRETCH  Gastrocsoleus, Standing  Note: This exercise can place a lot of stress on your foot and ankle. Please complete this exercise only if specifically instructed by your caregiver.   Place the ball of your right / left foot on a step, keeping your other foot firmly on the same step.  Hold on to the wall or a rail for balance.  Slowly lift your other foot, allowing your body weight to press your heel down over the edge of the step.  You should feel a stretch in your  right / left calf.  Hold this position for __________ seconds.  Repeat this exercise with a slight bend in your knee. Repeat __________ times. Complete this stretch __________ times per day.  STRENGTHENING EXERCISES - Achilles Tendinitis These exercises may help you when beginning to rehabilitate your injury. They may resolve your symptoms with or without further involvement from your physician, physical therapist or athletic trainer. While completing these exercises, remember:   Muscles can gain both the endurance and the strength needed for everyday activities through controlled exercises.  Complete these exercises as instructed by your physician, physical therapist or athletic trainer. Progress the resistance and repetitions only as guided.  You may experience muscle soreness or fatigue, but the pain or discomfort you are trying to eliminate should never worsen during these exercises. If this pain does worsen, stop and make certain you are following the directions exactly. If the pain is still present after adjustments, discontinue the exercise until you can discuss the trouble with your clinician. STRENGTH - Plantar-flexors   Sit with your right / left leg extended. Holding onto both ends of a rubber exercise band/tubing, loop it around the ball of your foot. Keep a slight tension in the band.  Slowly push your toes away from you, pointing them downward.  Hold this position for __________ seconds. Return slowly, controlling the tension in the band/tubing. Repeat __________ times. Complete this exercise __________ times per day.  STRENGTH - Plantar-flexors   Stand with your feet shoulder width apart. Steady yourself with a wall or table using as little support as needed.  Keeping your weight evenly spread over the width of your feet, rise up on your toes.*  Hold this position for __________ seconds. Repeat __________ times. Complete this exercise __________ times per day.  *If this  is too easy, shift your weight toward your right / left leg until you feel challenged. Ultimately, you may be asked to do this exercise with your right / left foot only. STRENGTH  Plantar-flexors, Eccentric  Note: This exercise can place a lot of stress on your foot and ankle. Please complete this exercise only if specifically instructed by your caregiver.   Place the balls of your feet on a step. With your hands, use only enough support from a wall or rail to keep your balance.  Keep your knees straight and rise up on your toes.  Slowly shift your weight entirely to your right / left toes and pick up your opposite foot. Gently and with controlled movement, lower your weight through your right / left foot so that your heel drops below the level of the step. You will feel a slight stretch in the back of your calf at the end position.  Use the healthy leg to help rise up onto the balls of both  feet, then lower weight only on the right / left leg again. Build up to 15 repetitions. Then progress to 3 consecutive sets of 15 repetitions.*  After completing the above exercise, complete the same exercise with a slight knee bend (about 30 degrees). Again, build up to 15 repetitions. Then progress to 3 consecutive sets of 15 repetitions.* Perform this exercise __________ times per day.  *When you easily complete 3 sets of 15, your physician, physical therapist or athletic trainer may advise you to add resistance by wearing a backpack filled with additional weight. STRENGTH - Plantar Flexors, Seated   Sit on a chair that allows your feet to rest flat on the ground. If necessary, sit at the edge of the chair.  Keeping your toes firmly on the ground, lift your right / left heel as far as you can without increasing any discomfort in your ankle. Repeat __________ times. Complete this exercise __________ times a day. *If instructed by your physician, physical therapist or athletic trainer, you may add  ____________________ of resistance by placing a weighted object on your right / left knee. Document Released: 07/11/2005 Document Revised: 03/03/2012 Document Reviewed: 03/24/2009 [facility] Patient Information 2014 [place], [place].

## 2014-03-29 NOTE — Progress Notes (Signed)
Subjective:     Patient ID: Ashley [name], female   DOB: Oct 21, 1966, 48 y.o.   MRN: [id number]  Toe Pain    patient presents stating I have a painful right big toenail that I have tried to trim so and have the corners removed without relief. It's been this way for at least 2 years and may be longer   Review of Systems  All other systems reviewed and are negative.       Objective:   Physical Exam  Nursing note and vitals reviewed. Constitutional: She is oriented to person, place, and time.  Cardiovascular: Intact distal pulses.   Musculoskeletal: Normal range of motion.  Neurological: She is oriented to person, place, and time.  Skin: Skin is warm.   neurovascular status intact with muscle strength adequate and range of motion within normal limits. Fill time to the digits was immediate and I noted well-developed arch height. Thick painful hallux nail right that is incurvated as it goes to the distal one third of the nailbed     Assessment:     Damage right hallux nail with pain    Plan:     H&P reviewed and discussed condition and options. Given the long-term history I did not recommend removal and allowing to regrow and I have recommended per minute removal of the nail bed. Patient wants this done understand and they will not regrow and understands risks and is willing to accept the risk of procedure. Infiltrated 60 mg Xylocaine Marcaine mixture remove the hallux nail exposed matrix and applied phenol 5 applications 30 seconds followed by alcohol lavaged and sterile dressing. Given instructions on soaks and reappoint

## 2014-03-29 NOTE — Progress Notes (Signed)
   Subjective:    Patient ID: Ashley [name], female    DOB: November 08, 1966, 48 y.o.   MRN: [id number]  HPI Comments: "My big toe hurts"  Patient c/o painful 1st toe right for about 2 years. The toenail is thick and discolored. Both borders of nail are painful. PCP recommended removal of the nail and let it grow back naturally. She has tried to clip out at home with no relief.  Toe Pain       Review of Systems  All other systems reviewed and are negative.       Objective:   Physical Exam        Assessment & Plan:

## 2014-03-30 ENCOUNTER — Encounter: Payer: Self-pay | Admitting: Internal Medicine

## 2014-04-02 ENCOUNTER — Other Ambulatory Visit: Payer: Self-pay

## 2014-04-02 MED ORDER — CLONAZEPAM 1 MG PO TABS: 1.0000 mg | ORAL_TABLET | Freq: Two times a day (BID) | ORAL | Status: DC

## 2014-04-02 NOTE — Telephone Encounter (Signed)
Pt left v/m requesting refill klonopin to [facility]; pt is out of med and request called in today. Pt request cb when done.

## 2014-04-02 NOTE — Telephone Encounter (Signed)
Spoke to pt and informed her Rx has been called in to requested pharmacy 

## 2014-04-05 ENCOUNTER — Ambulatory Visit: Payer: No Typology Code available for payment source | Admitting: Internal Medicine

## 2014-04-05 ENCOUNTER — Encounter (HOSPITAL_COMMUNITY): Payer: Self-pay | Admitting: Pharmacist

## 2014-04-08 ENCOUNTER — Ambulatory Visit: Payer: No Typology Code available for payment source | Admitting: Family Medicine

## 2014-04-08 NOTE — H&P (Addendum)
  48 yo G3P3 with chronic pelvic pain and dysparaunia presents for surgical mngt.  PMHx:  Migraines, DM, GERD, Anxiety PSHx:  SVD x 3, BTL, appy All:  PCN, topamax Meds:  Vagifem, norethindrone, amytriptiline, linzess, metformin, omeprazole, trazadone, clonazapam, imitrex, phenergan  SHx:  H/o tobacco use.  Denies ivdu, etoh  AF, VSS Gen - NAD ABd - soft, NT CV - RRR Lungs - clear PV - uterus small, mobile NT.  No adnexal masses.  Tender with manipulation of uterus Ext - NT, no edema  [place]:  No uterine or ovarian masses.  No free fluid  A/P:  Chronic pelvic pain LAVH/BSO. Discussion with pt regarding BSO - pt strongly desires BSO.  Understands risks of menopause symptoms worsening postoperatively.  Risks of bleeding, infection, damage to surrounding organs that might require further surgery discussed. R/B/A discussed including possibility that surgery may not improve symptoms.   All questions answered, informed consent

## 2014-04-12 ENCOUNTER — Encounter (HOSPITAL_COMMUNITY)
Admission: RE | Admit: 2014-04-12 | Discharge: 2014-04-12 | Disposition: A | Discharge status: Home / Self Care | Payer: No Typology Code available for payment source | Source: Ambulatory Visit | Attending: Obstetrics and Gynecology | Admitting: Obstetrics and Gynecology

## 2014-04-12 ENCOUNTER — Encounter (HOSPITAL_COMMUNITY): Payer: Self-pay

## 2014-04-12 HISTORY — DX: Bronchitis, not specified as acute or chronic: J40

## 2014-04-12 HISTORY — DX: Gastro-esophageal reflux disease without esophagitis: K21.9

## 2014-04-12 HISTORY — DX: Personal history of other medical treatment: Z92.89

## 2014-04-12 LAB — COMPREHENSIVE METABOLIC PANEL
ALBUMIN: 4 g/dL (ref 3.5–5.2)
ALT: 6 U/L (ref 0–35)
AST: 13 U/L (ref 0–37)
Alkaline Phosphatase: 66 U/L (ref 39–117)
BUN: 10 mg/dL (ref 6–23)
CALCIUM: 9.4 mg/dL (ref 8.4–10.5)
CO2: 26 mEq/L (ref 19–32)
CREATININE: 0.77 mg/dL (ref 0.50–1.10)
Chloride: 103 mEq/L (ref 96–112)
GFR calc non Af Amer: >90 mL/min (ref >90)
GLUCOSE: 113 mg/dL — AB (ref 70–99)
Potassium: 4.3 mEq/L (ref 3.7–5.3)
Sodium: 142 mEq/L (ref 137–147)
TOTAL PROTEIN: 7.6 g/dL (ref 6.0–8.3)
Total Bilirubin: 0.2 mg/dL — ABNORMAL LOW (ref 0.3–1.2)

## 2014-04-12 LAB — CBC
HEMATOCRIT: 38.6 % (ref 36.0–46.0)
HEMOGLOBIN: 13.2 g/dL (ref 12.0–15.0)
MCH: 32.4 pg (ref 26.0–34.0)
MCHC: 34.2 g/dL (ref 30.0–36.0)
MCV: 94.8 fL (ref 78.0–100.0)
Platelets: 382 10*3/uL (ref 150–400)
RBC: 4.07 MIL/uL (ref 3.87–5.11)
RDW: 13.1 % (ref 11.5–15.5)
WBC: 7.9 10*3/uL (ref 4.0–10.5)

## 2014-04-12 NOTE — Patient Instructions (Addendum)
   Your procedure is scheduled on: Thursday, April 23  Enter through the [employer] [facility] at:  6AM Pick up the phone at the desk and dial [phone] and inform us of your arrival.  Please call this number if you have any problems the morning of surgery: [phone]  Remember: Do not eat or drink after midnight: Wednesday Take these medicines the morning of surgery with a SIP OF WATER:  Klonopin, prilosec. Patient instructed to withhold Metformin on day of surgery.  Do not wear jewelry, make-up, or FINGER nail polish No metal in your hair or on your body. Do not wear lotions, powders, perfumes.  You may wear deodorant.  Do not bring valuables to the hospital. Contacts, dentures or bridgework may not be worn into surgery.  Leave suitcase in the car. After Surgery it may be brought to your room. For patients being admitted to the hospital, checkout time is 11:00am the day of discharge.  Home with husband Alvester Chou cell [phone].

## 2014-04-14 MED ORDER — GENTAMICIN SULFATE 40 MG/ML IJ SOLN
INTRAMUSCULAR | Status: AC
  Administered 2014-04-15: 280 mL via INTRAVENOUS
  Filled 2014-04-14: qty 7

## 2014-04-15 ENCOUNTER — Encounter (HOSPITAL_COMMUNITY)
Admission: RE | Disposition: A | Discharge status: Home / Self Care | Payer: Self-pay | Source: Ambulatory Visit | Attending: Obstetrics and Gynecology

## 2014-04-15 ENCOUNTER — Encounter (HOSPITAL_COMMUNITY): Payer: Self-pay | Admitting: Anesthesiology

## 2014-04-15 ENCOUNTER — Ambulatory Visit (HOSPITAL_COMMUNITY): Payer: No Typology Code available for payment source | Admitting: Anesthesiology

## 2014-04-15 ENCOUNTER — Encounter (HOSPITAL_COMMUNITY): Payer: No Typology Code available for payment source | Admitting: Anesthesiology

## 2014-04-15 ENCOUNTER — Ambulatory Visit (HOSPITAL_COMMUNITY)
Admission: RE | Admit: 2014-04-15 | Discharge: 2014-04-16 | Disposition: A | Discharge status: Home / Self Care | Payer: No Typology Code available for payment source | Source: Ambulatory Visit | Attending: Obstetrics and Gynecology | Admitting: Obstetrics and Gynecology

## 2014-04-15 DIAGNOSIS — IMO0002 Reserved for concepts with insufficient information to code with codable children: Secondary | ICD-10-CM | POA: Insufficient documentation

## 2014-04-15 DIAGNOSIS — Z90721 Acquired absence of ovaries, unilateral: Secondary | ICD-10-CM

## 2014-04-15 DIAGNOSIS — E119 Type 2 diabetes mellitus without complications: Secondary | ICD-10-CM | POA: Insufficient documentation

## 2014-04-15 DIAGNOSIS — N831 Corpus luteum cyst of ovary, unspecified side: Secondary | ICD-10-CM | POA: Insufficient documentation

## 2014-04-15 DIAGNOSIS — F17200 Nicotine dependence, unspecified, uncomplicated: Secondary | ICD-10-CM | POA: Insufficient documentation

## 2014-04-15 DIAGNOSIS — Z90710 Acquired absence of both cervix and uterus: Secondary | ICD-10-CM | POA: Diagnosis present

## 2014-04-15 DIAGNOSIS — N72 Inflammatory disease of cervix uteri: Secondary | ICD-10-CM | POA: Insufficient documentation

## 2014-04-15 DIAGNOSIS — N949 Unspecified condition associated with female genital organs and menstrual cycle: Principal | ICD-10-CM | POA: Insufficient documentation

## 2014-04-15 DIAGNOSIS — N840 Polyp of corpus uteri: Secondary | ICD-10-CM | POA: Insufficient documentation

## 2014-04-15 DIAGNOSIS — K219 Gastro-esophageal reflux disease without esophagitis: Secondary | ICD-10-CM | POA: Insufficient documentation

## 2014-04-15 DIAGNOSIS — N946 Dysmenorrhea, unspecified: Secondary | ICD-10-CM | POA: Insufficient documentation

## 2014-04-15 DIAGNOSIS — F411 Generalized anxiety disorder: Secondary | ICD-10-CM | POA: Insufficient documentation

## 2014-04-15 DIAGNOSIS — Z9851 Tubal ligation status: Secondary | ICD-10-CM | POA: Insufficient documentation

## 2014-04-15 DIAGNOSIS — D251 Intramural leiomyoma of uterus: Secondary | ICD-10-CM | POA: Insufficient documentation

## 2014-04-15 HISTORY — PX: LAPAROSCOPIC ASSISTED VAGINAL HYSTERECTOMY: SHX5398

## 2014-04-15 HISTORY — PX: LAPAROSCOPIC BILATERAL SALPINGO OOPHERECTOMY: SHX5890

## 2014-04-15 LAB — GLUCOSE, CAPILLARY
GLUCOSE-CAPILLARY: 114 mg/dL — AB (ref 70–99)
GLUCOSE-CAPILLARY: 155 mg/dL — AB (ref 70–99)

## 2014-04-15 SURGERY — HYSTERECTOMY, VAGINAL, LAPAROSCOPY-ASSISTED
Anesthesia: General | Site: Abdomen

## 2014-04-15 MED ORDER — PROPOFOL 10 MG/ML IV EMUL
INTRAVENOUS | Status: AC
  Filled 2014-04-15: qty 20

## 2014-04-15 MED ORDER — LACTATED RINGERS IV SOLN
INTRAVENOUS | Status: DC
  Administered 2014-04-15 (×3): via INTRAVENOUS

## 2014-04-15 MED ORDER — KETOROLAC TROMETHAMINE 30 MG/ML IJ SOLN
30.0000 mg | Freq: Three times a day (TID) | INTRAMUSCULAR | Status: DC
  Administered 2014-04-15 – 2014-04-16 (×2): 30 mg via INTRAVENOUS
  Filled 2014-04-15 (×2): qty 1

## 2014-04-15 MED ORDER — POLYETHYLENE GLYCOL 3350 17 G PO PACK
17.0000 g | PACK | Freq: Every day | ORAL | Status: DC
Start: 2014-04-15 — End: 2014-04-16
  Filled 2014-04-15 (×2): qty 1

## 2014-04-15 MED ORDER — PNEUMOCOCCAL VAC POLYVALENT 25 MCG/0.5ML IJ INJ
0.5000 mL | INJECTION | INTRAMUSCULAR | Status: AC
  Administered 2014-04-16: 0.5 mL via INTRAMUSCULAR
  Filled 2014-04-15: qty 0.5

## 2014-04-15 MED ORDER — DEXTROSE IN LACTATED RINGERS 5 % IV SOLN: INTRAVENOUS | Status: DC

## 2014-04-15 MED ORDER — PANTOPRAZOLE SODIUM 40 MG PO TBEC
40.0000 mg | DELAYED_RELEASE_TABLET | Freq: Every day | ORAL | Status: DC
  Filled 2014-04-15 (×2): qty 1

## 2014-04-15 MED ORDER — BUPIVACAINE HCL (PF) 0.25 % IJ SOLN
INTRAMUSCULAR | Status: AC
  Filled 2014-04-15: qty 30

## 2014-04-15 MED ORDER — OXYCODONE-ACETAMINOPHEN 5-325 MG PO TABS
1.0000 | ORAL_TABLET | ORAL | Status: DC | PRN
  Administered 2014-04-15 – 2014-04-16 (×3): 2 via ORAL
  Filled 2014-04-15 (×3): qty 2

## 2014-04-15 MED ORDER — HYDROMORPHONE HCL PF 1 MG/ML IJ SOLN
1.0000 mg | INTRAMUSCULAR | Status: DC | PRN
  Administered 2014-04-15 – 2014-04-16 (×4): 1 mg via INTRAVENOUS
  Filled 2014-04-15 (×4): qty 1

## 2014-04-15 MED ORDER — MIDAZOLAM HCL 2 MG/2ML IJ SOLN
INTRAMUSCULAR | Status: AC
  Filled 2014-04-15: qty 2

## 2014-04-15 MED ORDER — HYDROMORPHONE HCL PF 1 MG/ML IJ SOLN
INTRAMUSCULAR | Status: DC | PRN
  Administered 2014-04-15: 1 mg via INTRAVENOUS

## 2014-04-15 MED ORDER — AMITRIPTYLINE HCL 10 MG PO TABS
10.0000 mg | ORAL_TABLET | Freq: Every day | ORAL | Status: DC
  Administered 2014-04-15: 10 mg via ORAL
  Filled 2014-04-15: qty 1

## 2014-04-15 MED ORDER — METFORMIN HCL 500 MG PO TABS
500.0000 mg | ORAL_TABLET | Freq: Every day | ORAL | Status: DC
  Administered 2014-04-16: 500 mg via ORAL
  Filled 2014-04-15: qty 1

## 2014-04-15 MED ORDER — KETOROLAC TROMETHAMINE 30 MG/ML IJ SOLN
INTRAMUSCULAR | Status: AC
  Filled 2014-04-15: qty 1

## 2014-04-15 MED ORDER — ONDANSETRON HCL 4 MG/2ML IJ SOLN
INTRAMUSCULAR | Status: DC | PRN
  Administered 2014-04-15: 4 mg via INTRAVENOUS

## 2014-04-15 MED ORDER — ALBUTEROL SULFATE (2.5 MG/3ML) 0.083% IN NEBU: 3.0000 mL | INHALATION_SOLUTION | Freq: Four times a day (QID) | RESPIRATORY_TRACT | Status: DC | PRN

## 2014-04-15 MED ORDER — DEXAMETHASONE SODIUM PHOSPHATE 10 MG/ML IJ SOLN
INTRAMUSCULAR | Status: DC | PRN
  Administered 2014-04-15: 10 mg via INTRAVENOUS

## 2014-04-15 MED ORDER — KETOROLAC TROMETHAMINE 30 MG/ML IJ SOLN
INTRAMUSCULAR | Status: DC | PRN
  Administered 2014-04-15: 30 mg via INTRAVENOUS

## 2014-04-15 MED ORDER — FENTANYL CITRATE 0.05 MG/ML IJ SOLN
INTRAMUSCULAR | Status: DC | PRN
  Administered 2014-04-15 (×5): 50 ug via INTRAVENOUS

## 2014-04-15 MED ORDER — POLYETHYLENE GLYCOL 3350 17 GM/SCOOP PO POWD
17.0000 g | Freq: Every day | ORAL | Status: DC
  Filled 2014-04-15: qty 255

## 2014-04-15 MED ORDER — FENTANYL CITRATE 0.05 MG/ML IJ SOLN
INTRAMUSCULAR | Status: AC
  Filled 2014-04-15: qty 2

## 2014-04-15 MED ORDER — GLYCOPYRROLATE 0.2 MG/ML IJ SOLN
INTRAMUSCULAR | Status: AC
  Filled 2014-04-15: qty 3

## 2014-04-15 MED ORDER — ONDANSETRON HCL 4 MG/2ML IJ SOLN: 4.0000 mg | Freq: Four times a day (QID) | INTRAMUSCULAR | Status: DC | PRN

## 2014-04-15 MED ORDER — MENTHOL 3 MG MT LOZG: 1.0000 | LOZENGE | OROMUCOSAL | Status: DC | PRN

## 2014-04-15 MED ORDER — DEXAMETHASONE SODIUM PHOSPHATE 10 MG/ML IJ SOLN
INTRAMUSCULAR | Status: AC
  Filled 2014-04-15: qty 1

## 2014-04-15 MED ORDER — NEOSTIGMINE METHYLSULFATE 1 MG/ML IJ SOLN
INTRAMUSCULAR | Status: AC
  Filled 2014-04-15: qty 1

## 2014-04-15 MED ORDER — NEOSTIGMINE METHYLSULFATE 1 MG/ML IJ SOLN
INTRAMUSCULAR | Status: DC | PRN
  Administered 2014-04-15: 3 mg via INTRAVENOUS

## 2014-04-15 MED ORDER — BUPIVACAINE HCL (PF) 0.25 % IJ SOLN
INTRAMUSCULAR | Status: DC | PRN
  Administered 2014-04-15: 10 mL

## 2014-04-15 MED ORDER — FENTANYL CITRATE 0.05 MG/ML IJ SOLN
INTRAMUSCULAR | Status: AC
  Filled 2014-04-15: qty 5

## 2014-04-15 MED ORDER — GLYCOPYRROLATE 0.2 MG/ML IJ SOLN
INTRAMUSCULAR | Status: DC | PRN
  Administered 2014-04-15: 0.6 mg via INTRAVENOUS

## 2014-04-15 MED ORDER — ROCURONIUM BROMIDE 100 MG/10ML IV SOLN
INTRAVENOUS | Status: DC | PRN
  Administered 2014-04-15: 30 mg via INTRAVENOUS

## 2014-04-15 MED ORDER — DICYCLOMINE HCL 10 MG PO CAPS
10.0000 mg | ORAL_CAPSULE | Freq: Three times a day (TID) | ORAL | Status: DC
  Administered 2014-04-16: 10 mg via ORAL
  Filled 2014-04-15 (×5): qty 1

## 2014-04-15 MED ORDER — CLONAZEPAM 0.5 MG PO TABS
1.0000 mg | ORAL_TABLET | Freq: Two times a day (BID) | ORAL | Status: DC
  Administered 2014-04-15: 1 mg via ORAL
  Filled 2014-04-15: qty 2

## 2014-04-15 MED ORDER — FLUMAZENIL 0.5 MG/5ML IV SOLN
INTRAVENOUS | Status: DC | PRN
  Administered 2014-04-15: 0.2 mg via INTRAVENOUS

## 2014-04-15 MED ORDER — PROPOFOL 10 MG/ML IV BOLUS
INTRAVENOUS | Status: DC | PRN
  Administered 2014-04-15: 180 mg via INTRAVENOUS
  Administered 2014-04-15: 20 mg via INTRAVENOUS

## 2014-04-15 MED ORDER — HYDROMORPHONE HCL PF 1 MG/ML IJ SOLN
INTRAMUSCULAR | Status: AC
  Filled 2014-04-15: qty 1

## 2014-04-15 MED ORDER — ONDANSETRON HCL 4 MG PO TABS: 4.0000 mg | ORAL_TABLET | Freq: Four times a day (QID) | ORAL | Status: DC | PRN

## 2014-04-15 MED ORDER — FENTANYL CITRATE 0.05 MG/ML IJ SOLN
25.0000 ug | INTRAMUSCULAR | Status: DC | PRN
  Administered 2014-04-15: 25 ug via INTRAVENOUS
  Administered 2014-04-15: 50 ug via INTRAVENOUS

## 2014-04-15 MED ORDER — ROCURONIUM BROMIDE 100 MG/10ML IV SOLN
INTRAVENOUS | Status: AC
  Filled 2014-04-15: qty 1

## 2014-04-15 MED ORDER — LIDOCAINE HCL (CARDIAC) 20 MG/ML IV SOLN
INTRAVENOUS | Status: AC
  Filled 2014-04-15: qty 5

## 2014-04-15 MED ORDER — ONDANSETRON HCL 4 MG/2ML IJ SOLN
INTRAMUSCULAR | Status: AC
  Filled 2014-04-15: qty 2

## 2014-04-15 MED ORDER — LIDOCAINE HCL (CARDIAC) 20 MG/ML IV SOLN
INTRAVENOUS | Status: DC | PRN
  Administered 2014-04-15: 30 mg via INTRAVENOUS
  Administered 2014-04-15: 70 mg via INTRAVENOUS

## 2014-04-15 MED ORDER — MIDAZOLAM HCL 2 MG/2ML IJ SOLN
INTRAMUSCULAR | Status: DC | PRN
  Administered 2014-04-15: 2 mg via INTRAVENOUS

## 2014-04-15 SURGICAL SUPPLY — 35 items
ADH SKN CLS APL DERMABOND .7 (GAUZE/BANDAGES/DRESSINGS) ×2
CANISTER SUCT 3000ML (MISCELLANEOUS) ×3 IMPLANT
CHLORAPREP W/TINT 26ML (MISCELLANEOUS) ×6 IMPLANT
CLOTH BEACON ORANGE TIMEOUT ST (SAFETY) ×3 IMPLANT
COVER TABLE BACK 60X90 (DRAPES) ×3 IMPLANT
DERMABOND ADVANCED (GAUZE/BANDAGES/DRESSINGS) ×1
DERMABOND ADVANCED .7 DNX12 (GAUZE/BANDAGES/DRESSINGS) ×4 IMPLANT
ELECT REM PT RETURN 9FT ADLT (ELECTROSURGICAL) ×3
ELECTRODE REM PT RTRN 9FT ADLT (ELECTROSURGICAL) IMPLANT
GLOVE BIO SURGEON STRL SZ 6.5 (GLOVE) ×3 IMPLANT
GLOVE BIOGEL PI IND STRL 6.5 (GLOVE) ×2 IMPLANT
GLOVE BIOGEL PI IND STRL 7.0 (GLOVE) ×2 IMPLANT
GLOVE BIOGEL PI INDICATOR 6.5 (GLOVE) ×1
GLOVE BIOGEL PI INDICATOR 7.0 (GLOVE) ×1
GOWN STRL REUS W/TWL LRG LVL3 (GOWN DISPOSABLE) ×12 IMPLANT
NS IRRIG 1000ML POUR BTL (IV SOLUTION) ×3 IMPLANT
PACK LAVH (CUSTOM PROCEDURE TRAY) ×3 IMPLANT
PROTECTOR NERVE ULNAR (MISCELLANEOUS) ×3 IMPLANT
SEALER TISSUE G2 CVD JAW 45CM (ENDOMECHANICALS) ×3 IMPLANT
SUT MNCRL 0 MO-4 VIOLET 18 CR (SUTURE) ×4 IMPLANT
SUT MNCRL 0 VIOLET 6X18 (SUTURE) ×2 IMPLANT
SUT MNCRL AB 0 CT1 27 (SUTURE) ×1 IMPLANT
SUT MON AB 2-0 CT1 36 (SUTURE) ×3 IMPLANT
SUT MON AB-0 CT1 36 (SUTURE) ×1 IMPLANT
SUT MONOCRYL 0 6X18 (SUTURE) ×1
SUT MONOCRYL 0 MO 4 18  CR/8 (SUTURE) ×2
SUT VIC AB 3-0 PS2 18 (SUTURE) ×3
SUT VIC AB 3-0 PS2 18XBRD (SUTURE) ×2 IMPLANT
SUT VICRYL 0 UR6 27IN ABS (SUTURE) ×3 IMPLANT
TOWEL OR 17X24 6PK STRL BLUE (TOWEL DISPOSABLE) ×6 IMPLANT
TRAY FOLEY CATH 14FR (SET/KITS/TRAYS/PACK) ×3 IMPLANT
TROCAR OPTI TIP 5M 100M (ENDOMECHANICALS) ×3 IMPLANT
TROCAR XCEL NON-BLD 11X100MML (ENDOMECHANICALS) ×3 IMPLANT
WARMER LAPAROSCOPE (MISCELLANEOUS) ×3 IMPLANT
WATER STERILE IRR 1000ML POUR (IV SOLUTION) ×3 IMPLANT

## 2014-04-15 NOTE — Anesthesia Postprocedure Evaluation (Signed)
  Anesthesia Post Note  Patient: Ashley [name]  Procedure(s) Performed: Procedure(s) (LRB): LAPAROSCOPIC ASSISTED VAGINAL HYSTERECTOMY (N/A) LAPAROSCOPIC BILATERAL SALPINGO OOPHORECTOMY (Bilateral)  Anesthesia type: GA  Patient location: PACU  Post pain: Pain level controlled  Post assessment: Post-op Vital signs reviewed  Last Vitals:  Filed Vitals:   04/15/14 0945  BP: 130/60  Pulse: 79  Temp:   Resp: 15    Post vital signs: Reviewed  Level of consciousness: sedated  Complications: No apparent anesthesia complications

## 2014-04-15 NOTE — Addendum Note (Signed)
Addendum created 04/15/14 1620 by Brock Ra, CRNA   Modules edited: Notes Section   Notes Section:  File: [id number]

## 2014-04-15 NOTE — Transfer of Care (Signed)
Immediate Anesthesia Transfer of Care Note  Patient: Ashley [name]  Procedure(s) Performed: Procedure(s): LAPAROSCOPIC ASSISTED VAGINAL HYSTERECTOMY (N/A) LAPAROSCOPIC BILATERAL SALPINGO OOPHORECTOMY (Bilateral)  Patient Location: PACU  Anesthesia Type:General  Level of Consciousness: awake, alert , oriented and patient cooperative  Airway & Oxygen Therapy: Patient Spontanous Breathing and Patient connected to nasal cannula oxygen  Post-op Assessment: Report given to PACU RN and Post -op Vital signs reviewed and stable  Post vital signs: Reviewed and stable  Complications: No apparent anesthesia complications

## 2014-04-15 NOTE — Anesthesia Preprocedure Evaluation (Addendum)
Anesthesia Evaluation  Patient identified by MRN, date of birth, ID band Patient awake    Reviewed: Allergy & Precautions, H&P , Patient's Chart, lab work & pertinent test results, reviewed documented beta blocker date and time   Airway Mallampati: II TM Distance: >3 FB Neck ROM: full    Dental no notable dental hx. (+) Poor Dentition, Dental Advisory Given   Pulmonary former smoker,  breath sounds clear to auscultation  Pulmonary exam normal       Cardiovascular Rhythm:regular Rate:Normal     Neuro/Psych    GI/Hepatic GERD-  ,  Endo/Other  diabetes, Type 2  Renal/GU      Musculoskeletal   Abdominal   Peds  Hematology   Anesthesia Other Findings   Reproductive/Obstetrics                          Anesthesia Physical Anesthesia Plan  ASA: II  Anesthesia Plan: General   Post-op Pain Management:    Induction: Intravenous  Airway Management Planned: Oral ETT  Additional Equipment:   Intra-op Plan:   Post-operative Plan: Extubation in OR  Informed Consent: I have reviewed the patients History and Physical, chart, labs and discussed the procedure including the risks, benefits and alternatives for the proposed anesthesia with the patient or authorized representative who has indicated his/her understanding and acceptance.   Dental Advisory Given and Dental advisory given  Plan Discussed with: CRNA and Surgeon  Anesthesia Plan Comments: (  Discussed general anesthesia, including possible nausea, instrumentation of airway, sore throat,pulmonary aspiration, etc. I asked if the were any outstanding questions, or  concerns before we proceeded. )        Anesthesia Quick Evaluation

## 2014-04-15 NOTE — Anesthesia Postprocedure Evaluation (Signed)
  Anesthesia Post-op Note  Patient: Ashley [name]  Procedure(s) Performed: Procedure(s): LAPAROSCOPIC ASSISTED VAGINAL HYSTERECTOMY (N/A) LAPAROSCOPIC BILATERAL SALPINGO OOPHORECTOMY (Bilateral)  Patient Location: [facility]  Anesthesia Type:General  Level of Consciousness: awake, alert , oriented and patient cooperative  Airway and Oxygen Therapy: Patient Spontanous Breathing  Post-op Pain: mild  Post-op Assessment: Post-op Vital signs reviewed, Patient's Cardiovascular Status Stable, Respiratory Function Stable, Patent Airway, No signs of Nausea or vomiting, Adequate PO intake and Pain level controlled  Post-op Vital Signs: Reviewed and stable  Last Vitals:  Filed Vitals:   04/15/14 1600  BP: 120/60  Pulse: 93  Temp: 36.8 C  Resp: 18    Complications: No apparent anesthesia complications

## 2014-04-16 ENCOUNTER — Encounter (HOSPITAL_COMMUNITY): Payer: Self-pay | Admitting: Obstetrics and Gynecology

## 2014-04-16 LAB — CBC
HEMATOCRIT: 30.8 % — AB (ref 36.0–46.0)
HEMOGLOBIN: 10.4 g/dL — AB (ref 12.0–15.0)
MCH: 32.2 pg (ref 26.0–34.0)
MCHC: 33.8 g/dL (ref 30.0–36.0)
MCV: 95.4 fL (ref 78.0–100.0)
Platelets: 306 10*3/uL (ref 150–400)
RBC: 3.23 MIL/uL — ABNORMAL LOW (ref 3.87–5.11)
RDW: 13.1 % (ref 11.5–15.5)
WBC: 13.3 10*3/uL — ABNORMAL HIGH (ref 4.0–10.5)

## 2014-04-16 MED ORDER — IBUPROFEN 800 MG PO TABS: 800.0000 mg | ORAL_TABLET | Freq: Three times a day (TID) | ORAL | Status: DC | PRN

## 2014-04-16 MED ORDER — OXYCODONE-ACETAMINOPHEN 5-325 MG PO TABS: 1.0000 | ORAL_TABLET | ORAL | Status: DC | PRN

## 2014-04-16 NOTE — Discharge Summary (Signed)
Physician Discharge Summary  Patient ID: Ashley [name] MRN: [id number] DOB/AGE: 06/24/1966 48 y.o.  Admit date: 04/15/2014 Discharge date: 04/16/2014  Admission Diagnoses:  Pelvic pain  Discharge Diagnoses:  Active Problems:   S/P hysterectomy with oophorectomy   Discharged Condition: stable  Hospital Course: Pt admitted for postop care.  Pain was controlled with IV and PO meds.  On POD#1, foley was discontinued and she was able to ambulate and urinate without difficulty.  She was tolerating a regular diet and passing flatus.  No CP or SOB.  No n/v or fevers.  Vitals were stable and she was requesting discharge.    Consults: None  Significant Diagnostic Studies: labs: cbc  Treatments: surgery: LAVH/BSO  Discharge Exam: Blood pressure 97/53, pulse 68, temperature 98.1 F (36.7 C), temperature source Oral, resp. rate 18, height 5\' 2"  (1.575 m), weight 67.132 kg (148 lb), last menstrual period 02/12/2014, SpO2 95.00%. General appearance: alert and cooperative Cardio: regular rate and rhythm GI: normal findings: softly distended, good BS.  NT Incision/Wound:clean and intact  Disposition: 01-Home or Self Care     Medication List         albuterol 108 (90 BASE) MCG/ACT inhaler  Commonly known as:  PROVENTIL HFA;VENTOLIN HFA  Inhale 2 puffs into the lungs every 6 (six) hours as needed for wheezing.     amitriptyline 10 MG tablet  Commonly known as:  ELAVIL  Take 1 tablet (10 mg total) by mouth at bedtime.     clonazePAM 1 MG tablet  Commonly known as:  KLONOPIN  Take 1 tablet (1 mg total) by mouth 2 (two) times daily.     dicyclomine 10 MG capsule  Commonly known as:  BENTYL  Take 1 tab twice daily as needed for cramping and abdominal pain.     ERRIN 0.35 MG tablet  Generic drug:  norethindrone  Take 1 tablet by mouth daily.     Estradiol 10 MCG Tabs vaginal tablet  Place 1 tablet vaginally See admin instructions. Takes Tuesday and Friday at bedtime     ibuprofen  800 MG tablet  Commonly known as:  ADVIL,MOTRIN  Take 1 tablet (800 mg total) by mouth every 8 (eight) hours as needed.     metFORMIN 500 MG tablet  Commonly known as:  GLUCOPHAGE  Take 1 tablet (500 mg total) by mouth daily with breakfast.     naproxen 500 MG tablet  Commonly known as:  NAPROSYN  Take 500 mg by mouth 2 (two) times daily with a meal.     omeprazole 40 MG capsule  Commonly known as:  PRILOSEC  Take 1 tab twice daily for one month then go to once daily     oxyCODONE-acetaminophen 5-325 MG per tablet  Commonly known as:  PERCOCET/ROXICET  Take 1-2 tablets by mouth every 4 (four) hours as needed for severe pain (moderate to severe pain (when tolerating fluids)).     polyethylene glycol powder powder  Commonly known as:  GLYCOLAX/MIRALAX  - Take 17 g by mouth daily. Until daily soft stools  -   - OTC     promethazine 25 MG tablet  Commonly known as:  PHENERGAN  Take 1 tab by mouth every 6 hours as needed for nausea     SUMAtriptan 25 MG tablet  Commonly known as:  IMITREX  Take 1 tablet (25 mg total) by mouth every 2 (two) hours as needed for migraine or headache. May repeat in 2 hours if headache persists or  recurs.     traZODone 150 MG tablet  Commonly known as:  DESYREL  Take 1 tablet (150 mg total) by mouth at bedtime.           Follow-up Information   Schedule an appointment as soon as possible for a visit in 2 weeks to follow up.      Signed: Marylynn Pearson 04/16/2014, 8:14 AM

## 2014-04-16 NOTE — Progress Notes (Signed)
Pt discharged to home with husband.  Condition stable.  Pt ambulated to car with C. Riley, NT.  No equipment for home ordered at discharge. 

## 2014-04-16 NOTE — Discharge Instructions (Signed)
Laparoscopically Assisted Vaginal Hysterectomy  A laparoscopically assisted vaginal hysterectomy (LAVH) is a surgical procedure to remove the uterus and cervix, and sometimes the ovaries and fallopian tubes. During an LAVH, some of the surgical removal is done through the vagina, and the rest is done through a few small surgical cuts (incisions) in the abdomen.  This procedure is usually considered in women when a vaginal hysterectomy is not an option. Your health care provider will discuss the risks and benefits of the different surgical techniques at your appointment. Generally, recovery time is faster and there are fewer complications after laparoscopic procedures than after open incisional procedures. LET YOUR HEALTH CARE PROVIDER KNOW ABOUT:   Any allergies you have.  All medicines you are taking, including vitamins, herbs, eye drops, creams, and over-the-counter medicines.  Previous problems you or members of your family have had with the use of anesthetics.  Any blood disorders you have.  Previous surgeries you have had.  Medical conditions you have. RISKS AND COMPLICATIONS Generally, this is a safe procedure. However, as with any procedure, complications can occur. Possible complications include:  Allergies to medicines.  Difficulty breathing.  Bleeding.  Infection.  Damage to other structures near your uterus and cervix. BEFORE THE PROCEDURE  Ask your health care provider about changing or stopping your regular medicines.  Take certain medicines, such as a colon-emptying preparation, as directed.  Do not eat or drink anything for at least 8 hours before your surgery.  Stop smoking if you smoke. Stopping will improve your health after surgery.  Arrange for a ride home after surgery and for help at home during recovery. PROCEDURE   An IV tube will be put into one of your veins in order to give you fluids and medicines.  You will receive medicines to relax you and  medicines that make you sleep (general anesthetic).  You may have a flexible tube (catheter) put into your bladder to drain urine.  You may have a tube put through your nose or mouth that goes into your stomach (nasogastric tube). The nasogastric tube removes digestive fluids and prevents you from feeling nauseated and from vomiting.  Tight-fitting (compression) stockings will be placed on your legs to promote circulation.  Three to four small incisions will be made in your abdomen. An incision also will be made in your vagina. Probes and tools will be inserted into the small incisions. The uterus and cervix are removed (and possibly your ovaries and fallopian tubes) through your vagina as well as through the small incisions that were made in the abdomen.  Your vagina is then sewn back to normal. AFTER THE PROCEDURE  You may have a liquid diet temporarily. You will most likely return to, and tolerate, your usual diet the day after surgery.  You will be passing urine through a catheter. It will be removed the day after surgery.  Your temperature, breathing rate, heart rate, blood pressure, and oxygen level will be monitored regularly.  You will still wear compression stockings on your legs until you are able to move around.  You will use a special device or do breathing exercises to keep your lungs clear.  You will be encouraged to walk as soon as possible. Document Released: 11/29/2011 Document Revised: 08/12/2013 Document Reviewed: 06/25/2013 Exit[employer] Patient Information 2014 [employer].  

## 2014-04-16 NOTE — Op Note (Signed)
Ashley [name], Ashley [name]                   ACCOUNT NO.:  [id number]  MEDICAL RECORD NO.:  [id number]  LOCATION:  7096                          FACILITY:  [facility]  PHYSICIAN:  Marylynn Pearson, MD    DATE OF BIRTH:  Mar 15, 1966  DATE OF PROCEDURE:  04/15/2014 DATE OF DISCHARGE:                              OPERATIVE REPORT   PREOPERATIVE DIAGNOSES: 1. Pelvic pain 2. Dyspareunia.  POSTOPERATIVE DIAGNOSES: 1. Pelvic pain 2. Dyspareunia.  PROCEDURE:  Laparoscopic-assisted vaginal hysterectomy with bilateral salpingo-oophorectomy.  SURGEON:  Marylynn Pearson, MD  SURGICAL ASSISTANT:  Ralene Bathe. Matthew Saras, MD  ANESTHESIA:  General.  BLOOD LOSS:  100 mL.  URINE OUTPUT:  Clear.  SPECIMENS:  Uterus with bilateral fallopian tubes and ovaries.  COMPLICATIONS:  None.  CONDITION:  Stable to recovery room.  DESCRIPTION OF PROCEDURE:  The patient was taken to the operating room. After informed consent was obtained, she was given general anesthesia, placed in the dorsal lithotomy position using Allen stirrups, prepped and draped in sterile fashion, and a Foley catheter was inserted. Bivalve speculum was placed in the vagina and single-tooth tenaculum attached to the anterior lip of the cervix.  Hulka clamp was inserted. Tenaculum and speculum were removed and our attention was turned to the abdomen.  An infraumbilical skin incision was made with a scalpel and the optical trocar was inserted under direct visualization.  Once intraperitoneal placement was confirmed, CO2 was turned on and the abdomen and pelvis were insufflated.  A survey was performed.  Uterus and bilateral tubes and ovaries appeared normal.  The cul-de-sac was clear.  A suprapubic incision was made with a scalpel and a 5-mm trocar inserted under direct visualization.  Atraumatic grasper was inserted. The right adnexa was grasped upwards and tented towards the midline. The right ureter was easily visualized.  The Enseal device was  used to grasp, cauterize, and cut the right infundibulopelvic ligament.  This was extended down the broad ligament to the level of the round ligament. The round ligament was grasped, cauterized, and cut.  Excellent hemostasis was noted.  This procedure was repeated on the left side. Once hemostasis was assured bilaterally, all instruments were removed from the abdomen and our attention was turned to the vagina.  The Hulka clamp was removed from the cervix and a weighted speculum was placed posteriorly and a Deaver was placed anteriorly and the cervix was grasped with a toothed tenaculum.  A circumferential incision was made with the Bovie.  The posterior cul-de-sac was entered sharply with Metzenbaum scissors and a long weighted speculum was placed in the posterior cul-de-sac.  Bilateral uterosacral ligaments were grasped with Haney clamps, cut, and suture ligated.  The anterior cul-de-sac was entered sharply and the Deaver was placed anteriorly.  Uterine arteries and cardinal ligaments were then serially grasped, cut, and suture ligated.  Hemostasis was assured.  The uterus was then delivered posteriorly through the posterior cul-de-sac and the remaining pedicles were grasped with Heaney clamps and cut.  Free ties were placed and hemostasis was achieved.  The posterior vaginal cuff was reapproximated to the peritoneum in a running, locked stitch using Monocryl.  The remainder of the vaginal  cuff was closed using figure-of-eight sutures of Monocryl.  Once the cuff was closed and hemostasis was assured, our attention was returned to the abdomen.  The abdomen was insufflated and survey of all pedicles was performed.  Excellent hemostasis was noted. Instruments and trocars were removed.  A deep stitch was placed in an infraumbilical incision.  The skin was reapproximated with Vicryl and Dermabond was placed over the incision.  The patient was extubated and taken to the recovery room in  stable condition.  Sponge, lap, needle, and instrument counts were correct x2.     Marylynn Pearson, MD     GA/MEDQ  D:  04/15/2014  T:  04/16/2014  Job:  [id number]

## 2014-04-30 ENCOUNTER — Emergency Department (HOSPITAL_COMMUNITY)
Admission: EM | Admit: 2014-04-30 | Discharge: 2014-04-30 | Disposition: A | Discharge status: Home / Self Care | Payer: No Typology Code available for payment source | Attending: Emergency Medicine | Admitting: Emergency Medicine

## 2014-04-30 ENCOUNTER — Encounter (HOSPITAL_COMMUNITY): Payer: Self-pay | Admitting: Emergency Medicine

## 2014-04-30 ENCOUNTER — Telehealth: Payer: Self-pay | Admitting: Family Medicine

## 2014-04-30 DIAGNOSIS — Z862 Personal history of diseases of the blood and blood-forming organs and certain disorders involving the immune mechanism: Secondary | ICD-10-CM | POA: Insufficient documentation

## 2014-04-30 DIAGNOSIS — Z8709 Personal history of other diseases of the respiratory system: Secondary | ICD-10-CM | POA: Insufficient documentation

## 2014-04-30 DIAGNOSIS — K219 Gastro-esophageal reflux disease without esophagitis: Secondary | ICD-10-CM | POA: Insufficient documentation

## 2014-04-30 DIAGNOSIS — Z791 Long term (current) use of non-steroidal anti-inflammatories (NSAID): Secondary | ICD-10-CM | POA: Insufficient documentation

## 2014-04-30 DIAGNOSIS — G8929 Other chronic pain: Secondary | ICD-10-CM | POA: Insufficient documentation

## 2014-04-30 DIAGNOSIS — R1011 Right upper quadrant pain: Secondary | ICD-10-CM | POA: Insufficient documentation

## 2014-04-30 DIAGNOSIS — Z9889 Other specified postprocedural states: Secondary | ICD-10-CM | POA: Insufficient documentation

## 2014-04-30 DIAGNOSIS — Z90710 Acquired absence of both cervix and uterus: Secondary | ICD-10-CM | POA: Insufficient documentation

## 2014-04-30 DIAGNOSIS — Z87891 Personal history of nicotine dependence: Secondary | ICD-10-CM | POA: Insufficient documentation

## 2014-04-30 DIAGNOSIS — Z79899 Other long term (current) drug therapy: Secondary | ICD-10-CM | POA: Insufficient documentation

## 2014-04-30 DIAGNOSIS — Z880 Allergy status to penicillin: Secondary | ICD-10-CM | POA: Insufficient documentation

## 2014-04-30 DIAGNOSIS — F411 Generalized anxiety disorder: Secondary | ICD-10-CM | POA: Insufficient documentation

## 2014-04-30 DIAGNOSIS — E119 Type 2 diabetes mellitus without complications: Secondary | ICD-10-CM | POA: Insufficient documentation

## 2014-04-30 LAB — CBC WITH DIFFERENTIAL/PLATELET
Basophils Absolute: 0 10*3/uL (ref 0.0–0.1)
Basophils Relative: 0 % (ref 0–1)
EOS ABS: 0.1 10*3/uL (ref 0.0–0.7)
EOS PCT: 1 % (ref 0–5)
HCT: 36.7 % (ref 36.0–46.0)
Hemoglobin: 12.5 g/dL (ref 12.0–15.0)
Lymphocytes Relative: 27 % (ref 12–46)
Lymphs Abs: 2.6 10*3/uL (ref 0.7–4.0)
MCH: 32.1 pg (ref 26.0–34.0)
MCHC: 34.1 g/dL (ref 30.0–36.0)
MCV: 94.1 fL (ref 78.0–100.0)
Monocytes Absolute: 0.7 10*3/uL (ref 0.1–1.0)
Monocytes Relative: 7 % (ref 3–12)
Neutro Abs: 6.3 10*3/uL (ref 1.7–7.7)
Neutrophils Relative %: 65 % (ref 43–77)
PLATELETS: 373 10*3/uL (ref 150–400)
RBC: 3.9 MIL/uL (ref 3.87–5.11)
RDW: 13 % (ref 11.5–15.5)
WBC: 9.8 10*3/uL (ref 4.0–10.5)

## 2014-04-30 LAB — COMPREHENSIVE METABOLIC PANEL
ALT: 8 U/L (ref 0–35)
AST: 14 U/L (ref 0–37)
Albumin: 4.4 g/dL (ref 3.5–5.2)
Alkaline Phosphatase: 72 U/L (ref 39–117)
BUN: 12 mg/dL (ref 6–23)
CALCIUM: 9.3 mg/dL (ref 8.4–10.5)
CO2: 23 mEq/L (ref 19–32)
Chloride: 103 mEq/L (ref 96–112)
Creatinine, Ser: 0.78 mg/dL (ref 0.50–1.10)
GFR calc non Af Amer: >90 mL/min (ref >90)
Glucose, Bld: 93 mg/dL (ref 70–99)
Potassium: 4 mEq/L (ref 3.7–5.3)
SODIUM: 142 meq/L (ref 137–147)
TOTAL PROTEIN: 7.6 g/dL (ref 6.0–8.3)
Total Bilirubin: 0.3 mg/dL (ref 0.3–1.2)

## 2014-04-30 LAB — URINALYSIS, ROUTINE W REFLEX MICROSCOPIC
Bilirubin Urine: NEGATIVE
Glucose, UA: NEGATIVE mg/dL
Hgb urine dipstick: NEGATIVE
Ketones, ur: NEGATIVE mg/dL
NITRITE: NEGATIVE
Protein, ur: NEGATIVE mg/dL
SPECIFIC GRAVITY, URINE: 1.019 (ref 1.005–1.030)
UROBILINOGEN UA: 0.2 mg/dL (ref 0.0–1.0)
pH: 5 (ref 5.0–8.0)

## 2014-04-30 LAB — URINE MICROSCOPIC-ADD ON

## 2014-04-30 LAB — LIPASE, BLOOD: Lipase: 16 U/L (ref 11–59)

## 2014-04-30 MED ORDER — NAPROXEN 375 MG PO TABS: 375.0000 mg | ORAL_TABLET | Freq: Two times a day (BID) | ORAL | Status: DC

## 2014-04-30 MED ORDER — SODIUM CHLORIDE 0.9 % IV SOLN: 1000.0000 mL | INTRAVENOUS | Status: DC

## 2014-04-30 MED ORDER — FENTANYL CITRATE 0.05 MG/ML IJ SOLN
50.0000 ug | Freq: Once | INTRAMUSCULAR | Status: AC
  Administered 2014-04-30: 50 ug via INTRAVENOUS
  Filled 2014-04-30: qty 2

## 2014-04-30 MED ORDER — CYCLOBENZAPRINE HCL 5 MG PO TABS: 5.0000 mg | ORAL_TABLET | Freq: Three times a day (TID) | ORAL | Status: DC | PRN

## 2014-04-30 MED ORDER — METOCLOPRAMIDE HCL 10 MG PO TABS
10.0000 mg | ORAL_TABLET | Freq: Once | ORAL | Status: DC
  Filled 2014-04-30: qty 1

## 2014-04-30 MED ORDER — METOCLOPRAMIDE HCL 5 MG/ML IJ SOLN
10.0000 mg | Freq: Once | INTRAMUSCULAR | Status: AC
  Administered 2014-04-30: 10 mg via INTRAVENOUS
  Filled 2014-04-30: qty 2

## 2014-04-30 MED ORDER — SODIUM CHLORIDE 0.9 % IV SOLN
1000.0000 mL | Freq: Once | INTRAVENOUS | Status: AC
  Administered 2014-04-30: 1000 mL via INTRAVENOUS

## 2014-04-30 MED ORDER — DIPHENHYDRAMINE HCL 50 MG/ML IJ SOLN
25.0000 mg | Freq: Once | INTRAMUSCULAR | Status: AC
  Administered 2014-04-30: 25 mg via INTRAVENOUS
  Filled 2014-04-30: qty 1

## 2014-04-30 NOTE — Telephone Encounter (Signed)
Patient Information:  Caller Name: Kersti  Phone: [phone]  Patient: Ashley [name], Ashley [name]  Gender: Female  DOB: 06/05/1966  Age: 48 Years  PCP: Arnette Norris [facility])  Pregnant: No  Office Follow Up:  Does the office need to follow up with this patient?: No  Instructions For The Office: N/A   Symptoms  Reason For Call & Symptoms: Pt states she has a cramp under the right ribs and worsened.  Pain is 8-9 on pain scale 0-10.  Pt reports she has talked with the office staff about appt.  Pt reports she is not able to drive due to recent surgery and not able to make the available appt.  Needs appt later.  Reviewed Health History In EMR: Yes  Reviewed Medications In EMR: Yes  Reviewed Allergies In EMR: Yes  Reviewed Surgeries / Procedures: Yes  Date of Onset of Symptoms: 04/25/2014 OB / GYN:  LMP: Unknown  Guideline(s) Used:  Abdominal Pain - Female  Abdominal Pain - Upper  Disposition Per Guideline:   Go to ED Now  Reason For Disposition Reached:   Severe abdominal pain (e.g., excruciating)  Advice Given:  N/A  Patient Will Follow Care Advice:  YES

## 2014-04-30 NOTE — ED Notes (Signed)
Pt reports right rib/ flank pain that has been persistent for the past couple of months. States that she was seen here for the same previously. States the pain is worse with movement and palpation. States that she had a hysterectomy about 2 weeks ago. Reports her PCP sent her here for r/o for gallbladder.

## 2014-04-30 NOTE — ED Notes (Signed)
PT reports RUQ and R costal margin pain since Sunday. When it started it was ~7/10 and now 9/10 pain. Denies n/v. Reports diarrhea yesterday and today- states its a little darker than normal BM. PT had vaginal hysterectomy 2 weeks ago and had checkup yesterday with MD reporting she's healing well from that procedure.

## 2014-04-30 NOTE — ED Provider Notes (Signed)
CSN: [id number]     Arrival date & time 04/30/14  1000 History   First MD Initiated Contact with Patient 04/30/14 1104     Chief Complaint  Patient presents with  . Flank Pain     (Consider location/radiation/quality/duration/timing/severity/associated sxs/prior Treatment) HPI Patient reports 5 days ago she started getting a constant right upper quadrant pain that is sharp and burning. She states deep breathing and movement makes the pain worse. Nothing makes it feel better. She states today the pain got worse. She denies any association with food. She denies nausea, vomiting, fever, feeling dizzy or lightheaded, shortness of breath, cough. She states she has had this pain before, last October and it lasted 3 months. She states she had testing done including an endoscopy and colonoscopy by Dr. Hilarie Fredrickson with [facility] that were all normal. She states she called [facility] gastroenterology office however they do not have any openings for an appointment until June 29.  Patient had a hysterectomy 2 weeks ago. She was seen by her gynecologist yesterday and was told that surgery was healing well.   PCP Dr Deborra Medina with [facility] Dr Hilarie Fredrickson GYN Dr Julien Girt  Past Medical History  Diagnosis Date  . Anxiety   . Tobacco abuse   . Diabetes mellitus without complication   . Chronic headaches   . Cataract     bilateral  . SVD (spontaneous vaginal delivery)     x 3  . Bronchitis     uses inhaler if needed for bronchitis  . GERD (gastroesophageal reflux disease)   . Anemia     hx  . History of blood transfusion 01/31/1966    at birth in [place], [place], unsure number of units   Past Surgical History  Procedure Laterality Date  . Appendectomy  1988  . Tubal ligation    . Esophagogastroduodenoscopy    . Upper gastrointestinal endoscopy    . Knee arthroscopy  1995    left  . Colonoscopy    . Laparoscopic assisted vaginal hysterectomy N/A 04/15/2014    Procedure: LAPAROSCOPIC ASSISTED VAGINAL  HYSTERECTOMY;  Surgeon: Marylynn Pearson, MD;  Location: [facility];  Service: Gynecology;  Laterality: N/A;  . Laparoscopic bilateral salpingo oopherectomy Bilateral 04/15/2014    Procedure: LAPAROSCOPIC BILATERAL SALPINGO OOPHORECTOMY;  Surgeon: Marylynn Pearson, MD;  Location: [facility];  Service: Gynecology;  Laterality: Bilateral;   Family History  Problem Relation Age of Onset  . Diabetes Mother   . Hyperlipidemia Mother   . Hypertension Mother   . Colon cancer Neg Hx   . Rectal cancer Neg Hx   . Stomach cancer Neg Hx   . Prostate cancer Father   . Lung cancer Maternal Grandfather    History  Substance Use Topics  . Smoking status: Former Smoker -- 1.00 packs/day for 35 years    Types: Cigarettes    Quit date: 02/02/2014  . Smokeless tobacco: Never Used  . Alcohol Use: No   Patient is unemployed, she babysits her grandchildren who actually live with her  OB History   Grav Para Term Preterm Abortions TAB SAB Ect Mult Living                 Review of Systems  All other systems reviewed and are negative.     Allergies  Penicillins and Topamax  Home Medications   Prior to Admission medications   Medication Sig Start Date End Date Taking? Authorizing Provider  albuterol (PROVENTIL HFA;VENTOLIN HFA) 108 (90 BASE) MCG/ACT  inhaler Inhale 2 puffs into the lungs every 6 (six) hours as needed for wheezing.   Yes Historical Provider, MD  amitriptyline (ELAVIL) 10 MG tablet Take 1 tablet (10 mg total) by mouth at bedtime. 02/08/14  Yes Lucille Passy, MD  clonazePAM (KLONOPIN) 1 MG tablet Take 1 tablet (1 mg total) by mouth 2 (two) times daily. 04/02/14  Yes Lucille Passy, MD  dicyclomine (BENTYL) 10 MG capsule Take 1 tab twice daily as needed for cramping and abdominal pain. 02/12/14  Yes Amy S Esterwood, PA-C  ibuprofen (ADVIL,MOTRIN) 800 MG tablet Take 1 tablet (800 mg total) by mouth every 8 (eight) hours as needed. 04/16/14  Yes Marylynn Pearson, MD  metFORMIN (GLUCOPHAGE) 500 MG tablet  Take 1 tablet (500 mg total) by mouth daily with breakfast. 11/27/13  Yes Lucille Passy, MD  naproxen (NAPROSYN) 500 MG tablet Take 500 mg by mouth 2 (two) times daily with a meal.  01/04/14  Yes Historical Provider, MD  omeprazole (PRILOSEC) 40 MG capsule Take 1 tab twice daily for one month then go to once daily 02/04/14  Yes Lucille Passy, MD  oxyCODONE-acetaminophen (PERCOCET/ROXICET) 5-325 MG per tablet Take 1-2 tablets by mouth every 4 (four) hours as needed for severe pain (moderate to severe pain (when tolerating fluids)). 04/16/14  Yes Marylynn Pearson, MD  polyethylene glycol powder (GLYCOLAX/MIRALAX) powder Take 17 g by mouth daily. Until daily soft stools  OTC 12/10/13  Yes Jennifer L Piepenbrink, PA-C  promethazine (PHENERGAN) 25 MG tablet Take 1 tab by mouth every 6 hours as needed for nausea 02/19/14  Yes Lucille Passy, MD  traZODone (DESYREL) 150 MG tablet Take 1 tablet (150 mg total) by mouth at bedtime. 02/22/14  Yes Lucille Passy, MD  cyclobenzaprine (FLEXERIL) 5 MG tablet Take 1 tablet (5 mg total) by mouth 3 (three) times daily as needed for muscle spasms. 04/30/14   Janice Norrie, MD  naproxen (NAPROSYN) 375 MG tablet Take 1 tablet (375 mg total) by mouth 2 (two) times daily. 04/30/14   Janice Norrie, MD   BP 108/59  Pulse 57  Temp(Src) 98.8 F (37.1 C) (Oral)  Resp 12  Ht 5\' 2"  (1.575 m)  Wt 147 lb (66.679 kg)  BMI 26.88 kg/m2  SpO2 96%  LMP 02/12/2014  Vital signs normal except bradycardia  Physical Exam  Nursing note and vitals reviewed. Constitutional: She is oriented to person, place, and time. She appears well-developed and well-nourished.  Non-toxic appearance. She does not appear ill. No distress.  HENT:  Head: Normocephalic and atraumatic.  Right Ear: External ear normal.  Left Ear: External ear normal.  Nose: Nose normal. No mucosal edema or rhinorrhea.  Mouth/Throat: Oropharynx is clear and moist and mucous membranes are normal. No dental abscesses or uvula swelling.    Eyes: Conjunctivae and EOM are normal. Pupils are equal, round, and reactive to light.  Neck: Normal range of motion and full passive range of motion without pain. Neck supple.  Cardiovascular: Normal rate, regular rhythm and normal heart sounds.  Exam reveals no gallop and no friction rub.   No murmur heard. Pulmonary/Chest: Effort normal and breath sounds normal. No respiratory distress. She has no wheezes. She has no rhonchi. She has no rales. She exhibits no tenderness and no crepitus.  Abdominal: Soft. Normal appearance and bowel sounds are normal. She exhibits no distension. There is tenderness. There is no rebound and no guarding.    Musculoskeletal: Normal range of motion.  She exhibits no edema and no tenderness.  Moves all extremities well.   Neurological: She is alert and oriented to person, place, and time. She has normal strength. No cranial nerve deficit.  Skin: Skin is warm, dry and intact. No rash noted. No erythema. No pallor.  Psychiatric: She has a normal mood and affect. Her speech is normal and behavior is normal. Her mood appears not anxious.    ED Course  Procedures (including critical care time)  Medications  0.9 %  sodium chloride infusion (0 mLs Intravenous Stopped 04/30/14 1507)  fentaNYL (SUBLIMAZE) injection 50 mcg (50 mcg Intravenous Given 04/30/14 1258)  diphenhydrAMINE (BENADRYL) injection 25 mg (25 mg Intravenous Given 04/30/14 1258)  metoCLOPramide (REGLAN) injection 10 mg (10 mg Intravenous Given 04/30/14 1302)    Review of patient's prior studies shows she had an ultrasound of her abdomen done twice in December, both essentially normal, 1 read as possible small gallbladder polyp. The patient had a CT of her abdomen pelvis done in December 2014 that was normal. Patient had a nuclear medicine hepatic function study done in January that showed normal gallbladder function.  We discussed patient's laboratory results today. She did not want to have another ultrasound  done. She states she's going to see if her PCP can get her an appointment with her GI doctor sooner than June 29. We discussed treating her for muscular skeletal pain because her pain is worse with movement.   Labs Review Results for orders placed during the hospital encounter of 04/30/14  CBC WITH DIFFERENTIAL      Result Value Ref Range   WBC 9.8  4.0 - 10.5 K/uL   RBC 3.90  3.87 - 5.11 MIL/uL   Hemoglobin 12.5  12.0 - 15.0 g/dL   HCT 36.7  36.0 - 46.0 %   MCV 94.1  78.0 - 100.0 fL   MCH 32.1  26.0 - 34.0 pg   MCHC 34.1  30.0 - 36.0 g/dL   RDW 13.0  11.5 - 15.5 %   Platelets 373  150 - 400 K/uL   Neutrophils Relative % 65  43 - 77 %   Neutro Abs 6.3  1.7 - 7.7 K/uL   Lymphocytes Relative 27  12 - 46 %   Lymphs Abs 2.6  0.7 - 4.0 K/uL   Monocytes Relative 7  3 - 12 %   Monocytes Absolute 0.7  0.1 - 1.0 K/uL   Eosinophils Relative 1  0 - 5 %   Eosinophils Absolute 0.1  0.0 - 0.7 K/uL   Basophils Relative 0  0 - 1 %   Basophils Absolute 0.0  0.0 - 0.1 K/uL  COMPREHENSIVE METABOLIC PANEL      Result Value Ref Range   Sodium 142  137 - 147 mEq/L   Potassium 4.0  3.7 - 5.3 mEq/L   Chloride 103  96 - 112 mEq/L   CO2 23  19 - 32 mEq/L   Glucose, Bld 93  70 - 99 mg/dL   BUN 12  6 - 23 mg/dL   Creatinine, Ser 0.78  0.50 - 1.10 mg/dL   Calcium 9.3  8.4 - 10.5 mg/dL   Total Protein 7.6  6.0 - 8.3 g/dL   Albumin 4.4  3.5 - 5.2 g/dL   AST 14  0 - 37 U/L   ALT 8  0 - 35 U/L   Alkaline Phosphatase 72  39 - 117 U/L   Total Bilirubin 0.3  0.3 - 1.2  mg/dL   GFR calc non Af Amer >90  >90 mL/min   GFR calc Af Amer >90  >90 mL/min  LIPASE, BLOOD      Result Value Ref Range   Lipase 16  11 - 59 U/L  URINALYSIS, ROUTINE W REFLEX MICROSCOPIC      Result Value Ref Range   Color, Urine YELLOW  YELLOW   APPearance CLEAR  CLEAR   Specific Gravity, Urine 1.019  1.005 - 1.030   pH 5.0  5.0 - 8.0   Glucose, UA NEGATIVE  NEGATIVE mg/dL   Hgb urine dipstick NEGATIVE  NEGATIVE   Bilirubin Urine  NEGATIVE  NEGATIVE   Ketones, ur NEGATIVE  NEGATIVE mg/dL   Protein, ur NEGATIVE  NEGATIVE mg/dL   Urobilinogen, UA 0.2  0.0 - 1.0 mg/dL   Nitrite NEGATIVE  NEGATIVE   Leukocytes, UA SMALL (*) NEGATIVE  URINE MICROSCOPIC-ADD ON      Result Value Ref Range   Squamous Epithelial / LPF RARE  RARE   WBC, UA 0-2  <3 WBC/hpf   RBC / HPF 0-2  <3 RBC/hpf   Bacteria, UA FEW (*) RARE   Casts HYALINE CASTS (*) NEGATIVE   Urine-Other MUCOUS PRESENT     Laboratory interpretation all normal    Imaging Review No results found.   EKG Interpretation None      MDM   Final diagnoses:  RUQ abdominal pain    Discharge Medication List as of 04/30/2014  2:59 PM    START taking these medications   Details  cyclobenzaprine (FLEXERIL) 5 MG tablet Take 1 tablet (5 mg total) by mouth 3 (three) times daily as needed for muscle spasms., Starting 04/30/2014, Until Discontinued, Print    !! naproxen (NAPROSYN) 375 MG tablet Take 1 tablet (375 mg total) by mouth 2 (two) times daily., Starting 04/30/2014, Until Discontinued, Print     !! - Potential duplicate medications found. Please discuss with provider.      Plan discharge  Rolland Porter, MD, Alanson Aly, MD 04/30/14 [phone]

## 2014-04-30 NOTE — Discharge Instructions (Signed)
Take the medications as prescribed. Follow up with Dr Hulen Shouts office to see if they can get you into the GI doctor sooner. Return to the ED if you get a fever, have uncontrolled vomiting or pain.

## 2014-05-03 ENCOUNTER — Other Ambulatory Visit: Payer: Self-pay

## 2014-05-03 MED ORDER — CLONAZEPAM 1 MG PO TABS: 1.0000 mg | ORAL_TABLET | Freq: Two times a day (BID) | ORAL | Status: DC

## 2014-05-03 NOTE — Telephone Encounter (Signed)
Pt left v/m requesting refill clonazepam to [facility].Please advise.  

## 2014-05-03 NOTE — Telephone Encounter (Signed)
Spoke to pt and informed her Rx has been called in to requested pharmacy 

## 2014-05-07 ENCOUNTER — Ambulatory Visit (INDEPENDENT_AMBULATORY_CARE_PROVIDER_SITE_OTHER): Payer: No Typology Code available for payment source | Admitting: Family Medicine

## 2014-05-07 ENCOUNTER — Encounter: Payer: Self-pay | Admitting: Family Medicine

## 2014-05-07 VITALS — BP 110/66 | HR 72 | Temp 98.0°F | Wt 142.8 lb

## 2014-05-07 DIAGNOSIS — G43909 Migraine, unspecified, not intractable, without status migrainosus: Secondary | ICD-10-CM

## 2014-05-07 DIAGNOSIS — R739 Hyperglycemia, unspecified: Secondary | ICD-10-CM

## 2014-05-07 DIAGNOSIS — R7309 Other abnormal glucose: Secondary | ICD-10-CM

## 2014-05-07 DIAGNOSIS — R109 Unspecified abdominal pain: Secondary | ICD-10-CM

## 2014-05-07 DIAGNOSIS — R197 Diarrhea, unspecified: Secondary | ICD-10-CM | POA: Insufficient documentation

## 2014-05-07 LAB — HEMOGLOBIN A1C: Hgb A1c MFr Bld: 6 % (ref 4.6–6.5)

## 2014-05-07 MED ORDER — PROMETHAZINE HCL 25 MG PO TABS: ORAL_TABLET | ORAL | Status: DC

## 2014-05-07 NOTE — Progress Notes (Signed)
Pre visit review using our clinic review tool, if applicable. No additional management support is needed unless otherwise documented below in the visit note. 

## 2014-05-07 NOTE — Assessment & Plan Note (Addendum)
Does seem consistent with biliary colic although has had a significant neg work up. Will place urgent referral to GI since symptoms have worsened.

## 2014-05-07 NOTE — Progress Notes (Addendum)
Subjective:   Patient ID: Ashley [name], female    DOB: Jun 22, 1966, 48 y.o.   MRN: [id number]  Ashley [name] is a pleasant 48 y.o. year old female who presents to clinic today with Hospitalization Follow-up  on 05/07/2014  HPI: Martin Majestic to [facility] on 04/30/2014 for constant and progressing RUQ pain.   Note reviewed.  Pain worsened by deep inspirations.    Has been seeing Dr. Hilarie Fredrickson- neg endoscopy and colonoscopy.  Neg ultrasounds of her abdomen last year, both essentially normal- ?small gallbladder polyp.   CT of her abdomen pelvis done in December 2014 was normal.  Nuclear medicine hepatic function study done in January that showed normal gallbladder function.   Lab Results  Component Value Date   ALT 8 04/30/2014   AST 14 04/30/2014   ALKPHOS 72 04/30/2014   BILITOT 0.3 04/30/2014   Lab Results  Component Value Date   CREATININE 0.78 04/30/2014   Lab Results  Component Value Date   WBC 9.8 04/30/2014   HGB 12.5 04/30/2014   HCT 36.7 04/30/2014   MCV 94.1 04/30/2014   PLT 373 04/30/2014     She asked to not have another ultrasound done in ER.  She was given flexeril but did not help much.    Since going to ER, she feels anything fried or with a lot of grease makes it worse.  She really feels scans are missing something with her gallbladder. Even had a hysterectomy with oophorectomy to see if that would help with pain and it has not.  Has follow up app with GI on 6/29 and is here today because she wants me to get her in sooner with GI. This pain has been intermittent since last fall and getting more intense.     No nausea. Bowel movements vacillating between constipation and diarrhea.  Also has "several other issues."  She wants to discuss if she needs some of her medications.  Taking Metformin 500 mg qam. Lab Results  Component Value Date   HGBA1C 6.0 01/28/2014   She is having loose bowels but taking Miralax daily.   Patient Active Problem List   Diagnosis Date Noted  . Routine general  medical examination at a health care facility 05/24/2011    Priority: High  . TOBACCO ABUSE 09/30/2009    Priority: High  . INSOMNIA, CHRONIC 09/30/2009    Priority: High  . Diarrhea 05/07/2014  . S/P hysterectomy with oophorectomy 04/15/2014  . Abdominal pain, unspecified site 02/04/2014  . Intractable migraine 10/09/2013  . Hyperglycemia 01/26/2013  . Dyspareunia 01/26/2013  . MIGRAINE HEADACHE 05/30/2010  . ANXIETY 09/30/2009  . ADJUSTMENT DISORDER WITH MIXED FEATURES 09/30/2009  . ELEVATED BLOOD PRESSURE WITHOUT DIAGNOSIS OF HYPERTENSION 09/30/2009   Past Medical History  Diagnosis Date  . Anxiety   . Tobacco abuse   . Diabetes mellitus without complication   . Chronic headaches   . Cataract     bilateral  . SVD (spontaneous vaginal delivery)     x 3  . Bronchitis     uses inhaler if needed for bronchitis  . GERD (gastroesophageal reflux disease)   . Anemia     hx  . History of blood transfusion 12/13/1966    at birth in [place], [place], unsure number of units   Past Surgical History  Procedure Laterality Date  . Appendectomy  1988  . Tubal ligation    . Esophagogastroduodenoscopy    . Upper gastrointestinal endoscopy    . Knee  arthroscopy  1995    left  . Colonoscopy    . Laparoscopic assisted vaginal hysterectomy N/A 04/15/2014    Procedure: LAPAROSCOPIC ASSISTED VAGINAL HYSTERECTOMY;  Surgeon: Marylynn Pearson, MD;  Location: [facility];  Service: Gynecology;  Laterality: N/A;  . Laparoscopic bilateral salpingo oopherectomy Bilateral 04/15/2014    Procedure: LAPAROSCOPIC BILATERAL SALPINGO OOPHORECTOMY;  Surgeon: Marylynn Pearson, MD;  Location: [facility];  Service: Gynecology;  Laterality: Bilateral;   History  Substance Use Topics  . Smoking status: Former Smoker -- 1.00 packs/day for 35 years    Types: Cigarettes    Quit date: 02/02/2014  . Smokeless tobacco: Never Used  . Alcohol Use: No   Family History  Problem Relation Age of Onset  . Diabetes Mother   .  Hyperlipidemia Mother   . Hypertension Mother   . Colon cancer Neg Hx   . Rectal cancer Neg Hx   . Stomach cancer Neg Hx   . Prostate cancer Father   . Lung cancer Maternal Grandfather    Allergies  Allergen Reactions  . Penicillins Anaphylaxis    REACTION: Throat swells  . Topamax [Topiramate]     Hands and feet to go numb   Current Outpatient Prescriptions on File Prior to Visit  Medication Sig Dispense Refill  . amitriptyline (ELAVIL) 10 MG tablet Take 1 tablet (10 mg total) by mouth at bedtime.  30 tablet  1  . clonazePAM (KLONOPIN) 1 MG tablet Take 1 tablet (1 mg total) by mouth 2 (two) times daily.  60 tablet  0  . dicyclomine (BENTYL) 10 MG capsule Take 1 tab twice daily as needed for cramping and abdominal pain.  60 capsule  4  . ibuprofen (ADVIL,MOTRIN) 800 MG tablet Take 1 tablet (800 mg total) by mouth every 8 (eight) hours as needed.  30 tablet  0  . metFORMIN (GLUCOPHAGE) 500 MG tablet Take 1 tablet (500 mg total) by mouth daily with breakfast.  90 tablet  1  . naproxen (NAPROSYN) 375 MG tablet Take 1 tablet (375 mg total) by mouth 2 (two) times daily.  20 tablet  0  . omeprazole (PRILOSEC) 40 MG capsule Take 1 tab twice daily for one month then go to once daily  180 capsule  3  . polyethylene glycol powder (GLYCOLAX/MIRALAX) powder Take 17 g by mouth daily. Until daily soft stools  OTC      . traZODone (DESYREL) 150 MG tablet Take 1 tablet (150 mg total) by mouth at bedtime.  90 tablet  1   No current facility-administered medications on file prior to visit.   The PMH, PSH, Social History, Family History, Medications, and allergies have been reviewed in [facility], and have been updated if relevant.  Review of Systems     Objective:    BP 110/66  Pulse 72  Temp(Src) 98 F (36.7 C) (Oral)  Wt 142 lb 12 oz (64.751 kg)  SpO2 98%  LMP 02/12/2014   Physical Exam  Nursing note and vitals reviewed. Constitutional: She appears well-developed and well-nourished. No  distress.  HENT:  Head: Normocephalic.  Eyes: Pupils are equal, round, and reactive to light.  Pulmonary/Chest: Effort normal and breath sounds normal.  Abdominal: Soft. Bowel sounds are normal. She exhibits no distension. There is tenderness. There is no rebound and no guarding.  Musculoskeletal: Normal range of motion.  Psychiatric: She has a normal mood and affect. Her behavior is normal. Judgment and thought content normal.  Assessment & Plan:   Abdominal pain, unspecified site - Plan: Ambulatory referral to Gastroenterology  Diarrhea  Hyperglycemia - Plan: Hemoglobin A1c  MIGRAINE HEADACHE No Follow-up on file.

## 2014-05-07 NOTE — Assessment & Plan Note (Signed)
Improved remarkably since hysterectomy. Imitrex was not effective- I question how many were truly migraines.

## 2014-05-07 NOTE — Patient Instructions (Addendum)
Please STOP taking Metformin. Come see me in 3 months and we recheck blood sugar. Please do not take Miralax on days that you have lose stools.  Please go to front desk and let them know you need to set up a referral or they will call if you if this is not an urgent referral.  Either [name] or [name] will help you set it up.  Ashley [name]

## 2014-05-07 NOTE — Assessment & Plan Note (Signed)
D/c metformin. a1c was 6.0 in February.  Will recheck it today and again in 3 months.

## 2014-05-07 NOTE — Assessment & Plan Note (Signed)
D/c metformin, decrease miralax.

## 2014-05-11 ENCOUNTER — Encounter: Payer: Self-pay | Admitting: Gastroenterology

## 2014-05-11 ENCOUNTER — Ambulatory Visit (INDEPENDENT_AMBULATORY_CARE_PROVIDER_SITE_OTHER): Payer: No Typology Code available for payment source | Admitting: Gastroenterology

## 2014-05-11 VITALS — BP 118/66 | HR 78 | Ht 62.0 in | Wt 141.2 lb

## 2014-05-11 DIAGNOSIS — R1011 Right upper quadrant pain: Secondary | ICD-10-CM

## 2014-05-11 NOTE — Patient Instructions (Signed)
You will receive a call from [facility] or myself regarding your appointment. Make certain to bring a list of current medications, including any over the counter medications or vitamins. Also bring your co-pay if you have one as well as your insurance cards. [facility] is located at [address]. Should you need to reschedule your appointment, please contact them at [phone].

## 2014-05-11 NOTE — Progress Notes (Addendum)
05/11/2014 Ashley [name] [id number] 05/04/1966   History of Present Illness:  This is a 48 year old female who is known to Dr. Hilarie Fredrickson for extensive evaluation regarding complaints of right upper quadrant abdominal pain. She states that the pain began around the end of October or beginning of November of 2014. She underwent two right upper quadrant abdominal ultrasounds.  One of them showed a possible tiny 1.5 mm gallbladder polyp, but the other was unremarkable. She had a CT scan of the abdomen and pelvis with contrast which showed atherosclerotic disease and a moderate amount of stool in the colon.  Colonoscopy in February 2015 showed diverticulosis in the cecum and ascending colon, but was otherwise normal. EGD in January 2015 showed some duodenal inflammation, but biopsies were unremarkable.  Recent labs including a CBC, CMP, lipase, TSH, and urinalysis have been unremarkable. She returns our office today complaining of the same pain located in the right upper quadrant. She states that the pain is present every day and it is a dull pain at baseline.  It is definitely worsened when she eats, and she describes it as feeling like that there is a baby's foot or something pushing up under her right rib cage. She is currently taking omeprazole 40 mg daily and Bentyl 10 mg twice daily from a GI standpoint. She had previously been taking MiraLax, but states her stools have been loose recently so she has not been using that.  She is on amitriptyline for other reasons, but says that she has not noticed any difference in her abdominal pain while taking that.  Current Medications, Allergies, Past Medical History, Past Surgical History, Family History and Social History were reviewed in [employer] record.   Physical Exam: BP 118/66  Pulse 78  Ht 5\' 2"  (1.575 m)  Wt 141 lb 3.2 oz (64.048 kg)  BMI 25.82 kg/m2  LMP 02/12/2014 General: Well developed white female in no acute distress Head:  Normocephalic and atraumatic Eyes:  Sclerae anicteric, conjunctiva pink  Ears: Normal auditory acuity Lungs: Clear throughout to auscultation Heart: Regular rate and rhythm Abdomen: Soft, non-distended.  Normal bowel sounds.  RUQ TTP without R/R/G. Musculoskeletal: Symmetrical with no gross deformities  Extremities: No edema  Neurological: Alert oriented x 4, grossly non-focal Psychological:  Alert and cooperative. Normal mood and affect  Assessment and Recommendations: -RUQ abdominal pain:  ? Source.  Extensive evaluation unremarkable/negative to the point.  Will refer to [facility] to discuss possibility of cholecystectomy.    Addendum: Reviewed and agree with management. Of note patient did experience pain with CCK administration during HIDA scan in January.  Agree with [facility] referral for consideration of cholecystectomy for biliary dyskinesia Jerene Bears, MD

## 2014-05-24 ENCOUNTER — Ambulatory Visit (INDEPENDENT_AMBULATORY_CARE_PROVIDER_SITE_OTHER): Payer: No Typology Code available for payment source | Admitting: General Surgery

## 2014-05-24 ENCOUNTER — Encounter (INDEPENDENT_AMBULATORY_CARE_PROVIDER_SITE_OTHER): Payer: Self-pay

## 2014-05-24 ENCOUNTER — Encounter (INDEPENDENT_AMBULATORY_CARE_PROVIDER_SITE_OTHER): Payer: Self-pay | Admitting: General Surgery

## 2014-05-24 VITALS — BP 118/70 | HR 70 | Temp 98.3°F | Resp 12 | Ht 62.0 in | Wt 140.0 lb

## 2014-05-24 DIAGNOSIS — K824 Cholesterolosis of gallbladder: Secondary | ICD-10-CM

## 2014-05-24 NOTE — Progress Notes (Signed)
Patient ID: Ashley [name], female   DOB: 10/27/1966, 48 y.o.   MRN: [id number]  Chief Complaint  Patient presents with  . Abdominal Pain    HPI Ashley [name] is a 48 y.o. female.  Patient is a 48-year-old female who is referred by Dr. Zehr for evaluation of gallbladder polyp. Patient had a 4 to five-month history of abdominal pain to the right upper quadrant and radiates to the back. Patient states this occurs with any meal. She has not been able to be very much secondary to pain. There were no relieving factors. Patient had a HIDA scan which revealed an ejection fraction of 93%. Patient underwent ultrasound which revealed gallbladder polyp there were no gallstones.  HPI  Past Medical History  Diagnosis Date  . Anxiety   . Tobacco abuse   . Diabetes mellitus without complication   . Chronic headaches   . Cataract     bilateral  . SVD (spontaneous vaginal delivery)     x 3  . Bronchitis     uses inhaler if needed for bronchitis  . GERD (gastroesophageal reflux disease)   . Anemia     hx  . History of blood transfusion 12/1965    at birth in [place], [place], unsure number of units    Past Surgical History  Procedure Laterality Date  . Appendectomy  1988  . Tubal ligation    . Esophagogastroduodenoscopy  12/28/2013  . Knee arthroscopy  1995    left  . Colonoscopy  02/16/2014    normal   . Laparoscopic assisted vaginal hysterectomy N/A 04/15/2014    Procedure: LAPAROSCOPIC ASSISTED VAGINAL HYSTERECTOMY;  Surgeon: Gretchen Adkins, MD;  Location: [facility];  Service: Gynecology;  Laterality: N/A;  . Laparoscopic bilateral salpingo oopherectomy Bilateral 04/15/2014    Procedure: LAPAROSCOPIC BILATERAL SALPINGO OOPHORECTOMY;  Surgeon: Gretchen Adkins, MD;  Location: [facility];  Service: Gynecology;  Laterality: Bilateral;  . Abdominal hysterectomy      Family History  Problem Relation Age of Onset  . Diabetes Mother   . Hyperlipidemia Mother   . Hypertension Mother   . Colon cancer Neg Hx   .  Rectal cancer Neg Hx   . Stomach cancer Neg Hx   . Prostate cancer Father   . Lung cancer Maternal Grandfather     Social History History  Substance Use Topics  . Smoking status: Former Smoker -- 1.00 packs/day for 35 years    Types: Cigarettes    Quit date: 02/02/2014  . Smokeless tobacco: Never Used  . Alcohol Use: No    Allergies  Allergen Reactions  . Penicillins Anaphylaxis    REACTION: Throat swells  . Topamax [Topiramate]     Hands and feet to go numb    Current Outpatient Prescriptions  Medication Sig Dispense Refill  . amitriptyline (ELAVIL) 10 MG tablet Take 1 tablet (10 mg total) by mouth at bedtime.  30 tablet  1  . clonazePAM (KLONOPIN) 1 MG tablet Take 1 tablet (1 mg total) by mouth 2 (two) times daily.  60 tablet  0  . cyclobenzaprine (FLEXERIL) 5 MG tablet       . dicyclomine (BENTYL) 10 MG capsule Take 1 tab twice daily as needed for cramping and abdominal pain.  60 capsule  4  . ibuprofen (ADVIL,MOTRIN) 800 MG tablet       . omeprazole (PRILOSEC) 40 MG capsule Take 1 tab twice daily for one month then go to once daily  180 capsule  3  .   polyethylene glycol powder (GLYCOLAX/MIRALAX) powder Take 17 g by mouth daily. Until daily soft stools  OTC      . promethazine (PHENERGAN) 25 MG tablet Take 1 tab by mouth every 6 hours as needed for nausea  90 tablet  3  . traZODone (DESYREL) 150 MG tablet Take 1 tablet (150 mg total) by mouth at bedtime.  90 tablet  1   No current facility-administered medications for this visit.    Review of Systems Review of Systems  Constitutional: Negative.   HENT: Negative.   Respiratory: Negative.   Cardiovascular: Negative.   Gastrointestinal: Positive for nausea, vomiting and abdominal pain.  Neurological: Negative.   All other systems reviewed and are negative.   Blood pressure 118/70, pulse 70, temperature 98.3 F (36.8 C), resp. rate 12, height 5' 2" (1.575 m), weight 140 lb (63.504 kg), last menstrual period  02/12/2014.  Physical Exam Physical Exam  Constitutional: She is oriented to person, place, and time. She appears well-developed and well-nourished.  HENT:  Head: Normocephalic and atraumatic.  Eyes: Conjunctivae and EOM are normal. Pupils are equal, round, and reactive to light.  Neck: Normal range of motion. Neck supple.  Cardiovascular: Normal rate, regular rhythm and normal heart sounds.   Pulmonary/Chest: Effort normal and breath sounds normal.  Abdominal: Soft. Bowel sounds are normal. She exhibits no distension and no mass. There is tenderness. There is no rebound and no guarding.  Musculoskeletal: Normal range of motion.  Neurological: She is alert and oriented to person, place, and time.  Skin: Skin is warm and dry.  Psychiatric: She has a normal mood and affect.    Data Reviewed As above, polyp 1.5 mm  Assessment    48-year-old female with gallbladder polyp     Plan    1. I believe secondary to size there is an increased chance of possible cancer associated with the polyp. At this point I would recommend removing her gallbladder secondary to her symptoms as well as a gallbladder polyp. 2. We'll proceed to the operating room for laparoscopic cholecystectomy. 3. All risks and benefits were discussed with the patient to generally include: infection, bleeding, possible need for post op ERCP, damage to the bile ducts, and bile leak. Alternatives were offered and described.  All questions were answered and the patient voiced understanding of the procedure and wishes to proceed at this point with a laparoscopic cholecystectomy         Morris Longenecker 05/24/2014, 3:12 PM    

## 2014-06-02 ENCOUNTER — Other Ambulatory Visit: Payer: Self-pay

## 2014-06-02 ENCOUNTER — Encounter (HOSPITAL_COMMUNITY): Payer: Self-pay | Admitting: Pharmacist

## 2014-06-02 MED ORDER — AMITRIPTYLINE HCL 10 MG PO TABS: 10.0000 mg | ORAL_TABLET | Freq: Every day | ORAL | Status: DC

## 2014-06-02 MED ORDER — CLONAZEPAM 1 MG PO TABS: 1.0000 mg | ORAL_TABLET | Freq: Two times a day (BID) | ORAL | Status: DC

## 2014-06-02 NOTE — Telephone Encounter (Signed)
Pt left v/m requesting refill amitryptyline and klonopin to [facility] [place] [facility].Please advise.

## 2014-06-02 NOTE — Telephone Encounter (Signed)
Spoke to pt and informed her Rx has been called in to requested pharmacy 

## 2014-06-02 NOTE — Pre-Procedure Instructions (Signed)
Ashley [name]  06/02/2014   Your procedure is scheduled on: Monday, June 07, 2014  Report to [facility] (use Main Entrance "A'') at 9:00 AM.  Call this number if you have problems the morning of surgery: [phone]   Remember:   Do not eat food or drink liquids after midnight.  On SUNDAY   Take these medicines the morning of surgery with A SIP OF WATER: clonazePAM (KLONOPIN) omeprazole (PRILOSEC), if needed: PHENERGAN for nausea Stop taking Aspirin, vitamins and herbal medications. Do not take any NSAIDs ie: Ibuprofen, Advil, Naproxen or any medication containing Aspirin.    Do not wear jewelry, make-up or nail polish.    Do not wear lotions, powders, or perfumes. You may NOT wear deodorant.   Do not shave 48 hours prior to surgery.     Do not bring valuables to the hospital.  [facility] is not responsible for any belongings or valuables.               Contacts, dentures or bridgework may not be worn into surgery.   Leave suitcase in the car. After surgery it may be brought to your room.   For patients admitted to the hospital, discharge time is determined by your treatment team.                Patients discharged the day of surgery will not be allowed to drive home.  Name and phone number of your driver: /w SPOUSE   Special Instructions:  Special Instructions:Special Instructions: [facility] - Preparing for Surgery  Before surgery, you can play an important role.  Because skin is not sterile, your skin needs to be as free of germs as possible.  You can reduce the number of germs on you skin by washing with CHG (chlorahexidine gluconate) soap before surgery.  CHG is an antiseptic cleaner which kills germs and bonds with the skin to continue killing germs even after washing.  Please DO NOT use if you have an allergy to CHG or antibacterial soaps.  If your skin becomes reddened/irritated stop using the CHG and inform your nurse when you arrive at Short Stay.  Do  not shave (including legs and underarms) for at least 48 hours prior to the first CHG shower.  You may shave your face.  Please follow these instructions carefully:   1.  Shower with CHG Soap the night before surgery and the morning of Surgery.  2.  If you choose to wash your hair, wash your hair first as usual with your normal shampoo.  3.  After you shampoo, rinse your hair and body thoroughly to remove the Shampoo.  4.  Use CHG as you would any other liquid soap.  You can apply chg directly  to the skin and wash gently with scrungie or a clean washcloth.  5.  Apply the CHG Soap to your body ONLY FROM THE NECK DOWN.  Do not use on open wounds or open sores.  Avoid contact with your eyes, ears, mouth and genitals (private parts).  Wash genitals (private parts) with your normal soap.  6.  Wash thoroughly, paying special attention to the area where your surgery will be performed.  7.  Thoroughly rinse your body with warm water from the neck down.  8.  DO NOT shower/wash with your normal soap after using and rinsing off the CHG Soap.  9.  Pat yourself dry with a clean towel.  10.  Wear clean pajamas.            11.  Place clean sheets on your bed the night of your first shower and do not sleep with pets.  Day of Surgery  Do not apply any lotions/deodorants the morning of surgery.  Please wear clean clothes to the hospital/surgery center.   Please read over the following fact sheets that you were given: Pain Booklet, Coughing and Deep Breathing and Surgical Site Infection Prevention

## 2014-06-03 ENCOUNTER — Encounter (HOSPITAL_COMMUNITY): Payer: Self-pay

## 2014-06-03 ENCOUNTER — Encounter (HOSPITAL_COMMUNITY)
Admission: RE | Admit: 2014-06-03 | Discharge: 2014-06-03 | Disposition: A | Discharge status: Home / Self Care | Payer: No Typology Code available for payment source | Source: Ambulatory Visit | Attending: General Surgery | Admitting: General Surgery

## 2014-06-03 DIAGNOSIS — Z01818 Encounter for other preprocedural examination: Secondary | ICD-10-CM | POA: Insufficient documentation

## 2014-06-03 DIAGNOSIS — Z01812 Encounter for preprocedural laboratory examination: Secondary | ICD-10-CM | POA: Insufficient documentation

## 2014-06-03 HISTORY — DX: Disorder of teeth and supporting structures, unspecified: K08.9

## 2014-06-03 HISTORY — DX: Personal history of other medical treatment: Z92.89

## 2014-06-03 LAB — CBC
HEMATOCRIT: 39.3 % (ref 36.0–46.0)
Hemoglobin: 13.1 g/dL (ref 12.0–15.0)
MCH: 31.4 pg (ref 26.0–34.0)
MCHC: 33.3 g/dL (ref 30.0–36.0)
MCV: 94.2 fL (ref 78.0–100.0)
PLATELETS: 379 10*3/uL (ref 150–400)
RBC: 4.17 MIL/uL (ref 3.87–5.11)
RDW: 13.4 % (ref 11.5–15.5)
WBC: 7.3 10*3/uL (ref 4.0–10.5)

## 2014-06-03 LAB — BASIC METABOLIC PANEL
BUN: 11 mg/dL (ref 6–23)
CALCIUM: 9.8 mg/dL (ref 8.4–10.5)
CO2: 25 mEq/L (ref 19–32)
Chloride: 105 mEq/L (ref 96–112)
Creatinine, Ser: 0.74 mg/dL (ref 0.50–1.10)
GFR calc non Af Amer: >90 mL/min (ref >90)
Glucose, Bld: 111 mg/dL — ABNORMAL HIGH (ref 70–99)
Potassium: 4.8 mEq/L (ref 3.7–5.3)
Sodium: 143 mEq/L (ref 137–147)

## 2014-06-03 NOTE — Progress Notes (Signed)
Call to A. Zelenak, PA-C, requested review of EKG done 2014.

## 2014-06-04 NOTE — Progress Notes (Signed)
Anesthesia Chart Review:  Patient is a 48 year old female scheduled for laparoscopic cholecystectomy on 06/07/14 by Dr. Rosendo Gros.    History includes anxiety, GERD, DM2, former smoker since 02/02/2014, poor dentition, chronic headaches, hysterectomy 03/2014. PCP is Dr. Arnette Norris.   EKG on 11/12/13 showed SR.  Possible lateral infarct--however, aVR is positive suggesting either LA/RA or RA/RL lead reversal.  She has since tolerated hysterectomy on 04/15/14.  Preoperative labs noted.  A1C on 05/07/14 was 6.0.  Her EKG from 10/2013 suggests limb lead reversal.  Since she has since undergone GYN surgery less than two months ago, I will not order a repeat EKG preoperatively.  If one is desired by her assigned anesthesiologist, then it can be done on the day of surgery.  [facility] Stay Center/Anesthesiology Phone [phone] 06/04/2014 10:26 AM

## 2014-06-06 MED ORDER — CIPROFLOXACIN IN D5W 400 MG/200ML IV SOLN
400.0000 mg | INTRAVENOUS | Status: AC
  Administered 2014-06-07: 400 mg via INTRAVENOUS
  Filled 2014-06-06: qty 200

## 2014-06-07 ENCOUNTER — Encounter (HOSPITAL_COMMUNITY)
Admission: RE | Disposition: A | Discharge status: Home / Self Care | Payer: Self-pay | Source: Ambulatory Visit | Attending: General Surgery

## 2014-06-07 ENCOUNTER — Ambulatory Visit (HOSPITAL_COMMUNITY): Payer: No Typology Code available for payment source | Admitting: Anesthesiology

## 2014-06-07 ENCOUNTER — Ambulatory Visit (HOSPITAL_COMMUNITY)
Admission: RE | Admit: 2014-06-07 | Discharge: 2014-06-07 | Disposition: A | Discharge status: Home / Self Care | Payer: No Typology Code available for payment source | Source: Ambulatory Visit | Attending: General Surgery | Admitting: General Surgery

## 2014-06-07 ENCOUNTER — Encounter (HOSPITAL_COMMUNITY): Payer: Self-pay | Admitting: Anesthesiology

## 2014-06-07 ENCOUNTER — Encounter (HOSPITAL_COMMUNITY): Payer: No Typology Code available for payment source | Admitting: Vascular Surgery

## 2014-06-07 DIAGNOSIS — K811 Chronic cholecystitis: Secondary | ICD-10-CM

## 2014-06-07 DIAGNOSIS — Z880 Allergy status to penicillin: Secondary | ICD-10-CM | POA: Insufficient documentation

## 2014-06-07 DIAGNOSIS — K219 Gastro-esophageal reflux disease without esophagitis: Secondary | ICD-10-CM | POA: Insufficient documentation

## 2014-06-07 DIAGNOSIS — H269 Unspecified cataract: Secondary | ICD-10-CM | POA: Insufficient documentation

## 2014-06-07 DIAGNOSIS — F411 Generalized anxiety disorder: Secondary | ICD-10-CM | POA: Insufficient documentation

## 2014-06-07 DIAGNOSIS — Z87891 Personal history of nicotine dependence: Secondary | ICD-10-CM | POA: Insufficient documentation

## 2014-06-07 DIAGNOSIS — Z79899 Other long term (current) drug therapy: Secondary | ICD-10-CM | POA: Insufficient documentation

## 2014-06-07 DIAGNOSIS — E119 Type 2 diabetes mellitus without complications: Secondary | ICD-10-CM | POA: Insufficient documentation

## 2014-06-07 HISTORY — PX: CHOLECYSTECTOMY: SHX55

## 2014-06-07 LAB — GLUCOSE, CAPILLARY
GLUCOSE-CAPILLARY: 133 mg/dL — AB (ref 70–99)
GLUCOSE-CAPILLARY: 166 mg/dL — AB (ref 70–99)

## 2014-06-07 SURGERY — LAPAROSCOPIC CHOLECYSTECTOMY
Anesthesia: General | Site: Abdomen

## 2014-06-07 MED ORDER — FENTANYL CITRATE 0.05 MG/ML IJ SOLN
INTRAMUSCULAR | Status: AC
  Filled 2014-06-07: qty 5

## 2014-06-07 MED ORDER — 0.9 % SODIUM CHLORIDE (POUR BTL) OPTIME
TOPICAL | Status: DC | PRN
  Administered 2014-06-07: 1000 mL

## 2014-06-07 MED ORDER — GLYCOPYRROLATE 0.2 MG/ML IJ SOLN
INTRAMUSCULAR | Status: DC | PRN
  Administered 2014-06-07: 0.6 mg via INTRAVENOUS

## 2014-06-07 MED ORDER — HYDROMORPHONE HCL PF 1 MG/ML IJ SOLN
INTRAMUSCULAR | Status: DC
Start: 2014-06-07 — End: 2014-06-07
  Filled 2014-06-07: qty 1

## 2014-06-07 MED ORDER — METOCLOPRAMIDE HCL 5 MG/ML IJ SOLN
INTRAMUSCULAR | Status: DC | PRN
  Administered 2014-06-07: 10 mg via INTRAVENOUS

## 2014-06-07 MED ORDER — DEXAMETHASONE SODIUM PHOSPHATE 4 MG/ML IJ SOLN
INTRAMUSCULAR | Status: DC | PRN
  Administered 2014-06-07: 8 mg via INTRAVENOUS

## 2014-06-07 MED ORDER — ROCURONIUM BROMIDE 100 MG/10ML IV SOLN
INTRAVENOUS | Status: DC | PRN
  Administered 2014-06-07: 30 mg via INTRAVENOUS

## 2014-06-07 MED ORDER — DEXAMETHASONE SODIUM PHOSPHATE 4 MG/ML IJ SOLN
INTRAMUSCULAR | Status: AC
Start: 2014-06-07 — End: 2014-06-07
  Filled 2014-06-07: qty 2

## 2014-06-07 MED ORDER — HYDROMORPHONE HCL PF 1 MG/ML IJ SOLN
0.2500 mg | INTRAMUSCULAR | Status: DC | PRN
  Administered 2014-06-07 (×3): 0.5 mg via INTRAVENOUS

## 2014-06-07 MED ORDER — SODIUM CHLORIDE 0.9 % IR SOLN
Status: DC | PRN
  Administered 2014-06-07: 1000 mL

## 2014-06-07 MED ORDER — ROCURONIUM BROMIDE 50 MG/5ML IV SOLN
INTRAVENOUS | Status: AC
  Filled 2014-06-07: qty 1

## 2014-06-07 MED ORDER — LIDOCAINE HCL (CARDIAC) 20 MG/ML IV SOLN
INTRAVENOUS | Status: DC | PRN
  Administered 2014-06-07: 40 mg via INTRAVENOUS

## 2014-06-07 MED ORDER — OXYCODONE HCL 5 MG PO TABS: 5.0000 mg | ORAL_TABLET | Freq: Once | ORAL | Status: DC | PRN

## 2014-06-07 MED ORDER — PROPOFOL 10 MG/ML IV BOLUS
INTRAVENOUS | Status: DC | PRN
  Administered 2014-06-07: 150 mg via INTRAVENOUS

## 2014-06-07 MED ORDER — METOCLOPRAMIDE HCL 5 MG/ML IJ SOLN: 10.0000 mg | Freq: Once | INTRAMUSCULAR | Status: DC | PRN

## 2014-06-07 MED ORDER — FENTANYL CITRATE 0.05 MG/ML IJ SOLN
INTRAMUSCULAR | Status: DC | PRN
  Administered 2014-06-07: 100 ug via INTRAVENOUS
  Administered 2014-06-07: 50 ug via INTRAVENOUS

## 2014-06-07 MED ORDER — METOCLOPRAMIDE HCL 5 MG/ML IJ SOLN
INTRAMUSCULAR | Status: AC
  Filled 2014-06-07: qty 2

## 2014-06-07 MED ORDER — HYDROMORPHONE HCL PF 1 MG/ML IJ SOLN
INTRAMUSCULAR | Status: AC
  Filled 2014-06-07: qty 1

## 2014-06-07 MED ORDER — CHLORHEXIDINE GLUCONATE 4 % EX LIQD
1.0000 "application " | Freq: Once | CUTANEOUS | Status: DC
  Filled 2014-06-07: qty 15

## 2014-06-07 MED ORDER — ONDANSETRON HCL 4 MG/2ML IJ SOLN
INTRAMUSCULAR | Status: DC | PRN
  Administered 2014-06-07: 4 mg via INTRAVENOUS

## 2014-06-07 MED ORDER — OXYCODONE-ACETAMINOPHEN 5-325 MG PO TABS: 1.0000 | ORAL_TABLET | ORAL | Status: DC | PRN

## 2014-06-07 MED ORDER — BUPIVACAINE HCL (PF) 0.25 % IJ SOLN
INTRAMUSCULAR | Status: AC
  Filled 2014-06-07: qty 30

## 2014-06-07 MED ORDER — LACTATED RINGERS IV SOLN
INTRAVENOUS | Status: DC
  Administered 2014-06-07: 10:00:00 via INTRAVENOUS

## 2014-06-07 MED ORDER — BUPIVACAINE HCL 0.25 % IJ SOLN
INTRAMUSCULAR | Status: DC | PRN
  Administered 2014-06-07: 7 mL

## 2014-06-07 MED ORDER — MIDAZOLAM HCL 2 MG/2ML IJ SOLN
INTRAMUSCULAR | Status: AC
  Filled 2014-06-07: qty 2

## 2014-06-07 MED ORDER — MIDAZOLAM HCL 5 MG/5ML IJ SOLN
INTRAMUSCULAR | Status: DC | PRN
  Administered 2014-06-07: 2 mg via INTRAVENOUS

## 2014-06-07 MED ORDER — PROPOFOL 10 MG/ML IV BOLUS
INTRAVENOUS | Status: AC
  Filled 2014-06-07: qty 20

## 2014-06-07 MED ORDER — NEOSTIGMINE METHYLSULFATE 10 MG/10ML IV SOLN
INTRAVENOUS | Status: DC | PRN
  Administered 2014-06-07: 4 mg via INTRAVENOUS

## 2014-06-07 MED ORDER — OXYCODONE HCL 5 MG/5ML PO SOLN: 5.0000 mg | Freq: Once | ORAL | Status: DC | PRN

## 2014-06-07 SURGICAL SUPPLY — 51 items
ADH SKN CLS APL DERMABOND .7 (GAUZE/BANDAGES/DRESSINGS) ×1
APL SKNCLS STERI-STRIP NONHPOA (GAUZE/BANDAGES/DRESSINGS) ×1
BAG SPEC RTRVL 10 TROC 200 (ENDOMECHANICALS)
BAG SPEC RTRVL LRG 6X4 10 (ENDOMECHANICALS)
BENZOIN TINCTURE PRP APPL 2/3 (GAUZE/BANDAGES/DRESSINGS) ×2 IMPLANT
CANISTER SUCTION 2500CC (MISCELLANEOUS) ×2 IMPLANT
CHLORAPREP W/TINT 26ML (MISCELLANEOUS) ×2 IMPLANT
CLIP LIGATING HEMO O LOK GREEN (MISCELLANEOUS) ×2 IMPLANT
COVER MAYO STAND STRL (DRAPES) IMPLANT
COVER SURGICAL LIGHT HANDLE (MISCELLANEOUS) ×2 IMPLANT
COVER TRANSDUCER ULTRASND (DRAPES) ×2 IMPLANT
DERMABOND ADVANCED (GAUZE/BANDAGES/DRESSINGS) ×1
DERMABOND ADVANCED .7 DNX12 (GAUZE/BANDAGES/DRESSINGS) IMPLANT
DEVICE TROCAR PUNCTURE CLOSURE (ENDOMECHANICALS) ×2 IMPLANT
DRAPE C-ARM 42X72 X-RAY (DRAPES) IMPLANT
DRAPE UTILITY 15X26 W/TAPE STR (DRAPE) ×4 IMPLANT
ELECT REM PT RETURN 9FT ADLT (ELECTROSURGICAL) ×2
ELECTRODE REM PT RTRN 9FT ADLT (ELECTROSURGICAL) ×1 IMPLANT
GAUZE SPONGE 2X2 8PLY STRL LF (GAUZE/BANDAGES/DRESSINGS) ×1 IMPLANT
GLOVE BIO SURGEON STRL SZ 6 (GLOVE) ×1 IMPLANT
GLOVE BIO SURGEON STRL SZ7.5 (GLOVE) ×3 IMPLANT
GLOVE BIOGEL PI IND STRL 7.5 (GLOVE) IMPLANT
GLOVE BIOGEL PI INDICATOR 7.5 (GLOVE) ×2
GLOVE ECLIPSE 7.5 STRL STRAW (GLOVE) ×1 IMPLANT
GOWN STRL REUS W/ TWL LRG LVL3 (GOWN DISPOSABLE) ×3 IMPLANT
GOWN STRL REUS W/ TWL XL LVL3 (GOWN DISPOSABLE) ×1 IMPLANT
GOWN STRL REUS W/TWL LRG LVL3 (GOWN DISPOSABLE) ×6
GOWN STRL REUS W/TWL XL LVL3 (GOWN DISPOSABLE) ×2
IV CATH 14GX2 1/4 (CATHETERS) IMPLANT
KIT BASIN OR (CUSTOM PROCEDURE TRAY) ×2 IMPLANT
KIT ROOM TURNOVER OR (KITS) ×2 IMPLANT
NDL INSUFFLATION 14GA 120MM (NEEDLE) ×1 IMPLANT
NEEDLE INSUFFLATION 14GA 120MM (NEEDLE) ×2 IMPLANT
NS IRRIG 1000ML POUR BTL (IV SOLUTION) ×2 IMPLANT
PAD ARMBOARD 7.5X6 YLW CONV (MISCELLANEOUS) ×4 IMPLANT
POUCH RETRIEVAL ECOSAC 10 (ENDOMECHANICALS) IMPLANT
POUCH RETRIEVAL ECOSAC 10MM (ENDOMECHANICALS)
POUCH SPECIMEN RETRIEVAL 10MM (ENDOMECHANICALS) IMPLANT
SCISSORS LAP 5X35 DISP (ENDOMECHANICALS) ×2 IMPLANT
SET CHOLANGIOGRAPHY FRANKLIN (SET/KITS/TRAYS/PACK) IMPLANT
SET IRRIG TUBING LAPAROSCOPIC (IRRIGATION / IRRIGATOR) ×2 IMPLANT
SLEEVE ENDOPATH XCEL 5M (ENDOMECHANICALS) ×2 IMPLANT
SPECIMEN JAR SMALL (MISCELLANEOUS) ×2 IMPLANT
SPONGE GAUZE 2X2 STER 10/PKG (GAUZE/BANDAGES/DRESSINGS) ×1
SUT MNCRL AB 3-0 PS2 18 (SUTURE) ×2 IMPLANT
TOWEL OR 17X24 6PK STRL BLUE (TOWEL DISPOSABLE) ×1 IMPLANT
TOWEL OR 17X26 10 PK STRL BLUE (TOWEL DISPOSABLE) ×2 IMPLANT
TOWEL OR NON WOVEN STRL DISP B (DISPOSABLE) ×1 IMPLANT
TRAY LAPAROSCOPIC (CUSTOM PROCEDURE TRAY) ×2 IMPLANT
TROCAR XCEL NON-BLD 11X100MML (ENDOMECHANICALS) ×2 IMPLANT
TROCAR XCEL NON-BLD 5MMX100MML (ENDOMECHANICALS) ×2 IMPLANT

## 2014-06-07 NOTE — Op Note (Signed)
06/07/2014  11:00 AM  PATIENT:  Ashley [name]  48 y.o. female  PRE-OPERATIVE DIAGNOSIS:  gallbladder polyp  POST-OPERATIVE DIAGNOSIS:  same  PROCEDURE:  Procedure(s): LAPAROSCOPIC CHOLECYSTECTOMY (N/A)  SURGEON:  Surgeon(s) and Role:    * Ralene Ok, MD - Primary   ASSISTANTS: none   ANESTHESIA:   local and general  EBL:     BLOOD ADMINISTERED:none  DRAINS: none   LOCAL MEDICATIONS USED:  BUPIVICAINE   SPECIMEN:  Source of Specimen:  gallbladder  DISPOSITION OF SPECIMEN:  PATHOLOGY  COUNTS:  YES  TOURNIQUET:  * No tourniquets in log *  DICTATION: .Dragon Dictation  Details of the procedure:  The patient was taken to the operating and placed in the supine position with bilateral SCDs in place. A time out was called and all facts were verified. A pneumoperitoneum was obtained via A Veress needle technique to a pressure of 35mm of mercury. A 14mm trochar was then placed in the right upper quadrant under visualization, and there were no injuries to any abdominal organs. A 11 mm port was then placed in the umbilical region after infiltrating with local anesthesia under direct visualization. A second and third epigastric port and right lower quadrant port placement under direct visualization, respectively. The gallbladder was identified and retracted, the peritoneum was then sharply dissected from the gallbladder and this dissection was carried down to Calot's triangle. The gallbladder was identified and stripped away circumferentially and seen going into the gallbladder 360. 2 clips were placed proximally one distally and the cystic duct transected. The cystic artery was identified and 2 clips placed proximally and one distally and transected.  We then proceeded to remove the gallbladder off the hepatic fossa with Bovie cautery. A latex retrieval bad was then placed in the abdomen and gallbladder placed in the bag, and the bag removed in its entirety. The hepatic fossa was then  reexamined and hemostasis was achieved with Bovie cautery and was excellent at the end of the case. The subhepatic fossa and perihepatic fossa was then irrigated until the effluent was clear. The 11 mm trocar fascia was reapproximated with the Endo Close #1 Vicryl.  The pneumoperitoneum was evacuated and all trochars removed under direct visulalization.  The skin was then closed with 4-0 Monocryl and the skin dressed with Steri-Strips, gauze, and tape.  The patient was awaken from general anesthesia and taken to the recovery room in stable condition.   PLAN OF CARE: Discharge to home after PACU  PATIENT DISPOSITION:  PACU - hemodynamically stable.   Delay start of Pharmacological VTE agent (>24hrs) due to surgical blood loss or risk of bleeding: not applicable

## 2014-06-07 NOTE — Progress Notes (Signed)
Called for sign out

## 2014-06-07 NOTE — H&P (View-Only) (Signed)
Patient ID: Ashley [name], female   DOB: 09/26/1966, 48 y.o.   MRN: [id number]  Chief Complaint  Patient presents with  . Abdominal Pain    HPI Ashley [name] is a 48 y.o. female.  Patient is a 48 year old female who is referred by Dr. Myrtice Lauth for evaluation of gallbladder polyp. Patient had a 4 to five-month history of abdominal pain to the right upper quadrant and radiates to the back. Patient states this occurs with any meal. She has not been able to be very much secondary to pain. There were no relieving factors. Patient had a HIDA scan which revealed an ejection fraction of 93%. Patient underwent ultrasound which revealed gallbladder polyp there were no gallstones.  HPI  Past Medical History  Diagnosis Date  . Anxiety   . Tobacco abuse   . Diabetes mellitus without complication   . Chronic headaches   . Cataract     bilateral  . SVD (spontaneous vaginal delivery)     x 3  . Bronchitis     uses inhaler if needed for bronchitis  . GERD (gastroesophageal reflux disease)   . Anemia     hx  . History of blood transfusion 1966/03[birth year]06    at birth in [place], [place], unsure number of units    Past Surgical History  Procedure Laterality Date  . Appendectomy  1988  . Tubal ligation    . Esophagogastroduodenoscopy  12/28/2013  . Knee arthroscopy  1995    left  . Colonoscopy  02/16/2014    normal   . Laparoscopic assisted vaginal hysterectomy N/A 04/15/2014    Procedure: LAPAROSCOPIC ASSISTED VAGINAL HYSTERECTOMY;  Surgeon: Marylynn Pearson, MD;  Location: [facility];  Service: Gynecology;  Laterality: N/A;  . Laparoscopic bilateral salpingo oopherectomy Bilateral 04/15/2014    Procedure: LAPAROSCOPIC BILATERAL SALPINGO OOPHORECTOMY;  Surgeon: Marylynn Pearson, MD;  Location: [facility];  Service: Gynecology;  Laterality: Bilateral;  . Abdominal hysterectomy      Family History  Problem Relation Age of Onset  . Diabetes Mother   . Hyperlipidemia Mother   . Hypertension Mother   . Colon cancer Neg Hx   .  Rectal cancer Neg Hx   . Stomach cancer Neg Hx   . Prostate cancer Father   . Lung cancer Maternal Grandfather     Social History History  Substance Use Topics  . Smoking status: Former Smoker -- 1.00 packs/day for 35 years    Types: Cigarettes    Quit date: 02/02/2014  . Smokeless tobacco: Never Used  . Alcohol Use: No    Allergies  Allergen Reactions  . Penicillins Anaphylaxis    REACTION: Throat swells  . Topamax [Topiramate]     Hands and feet to go numb    Current Outpatient Prescriptions  Medication Sig Dispense Refill  . amitriptyline (ELAVIL) 10 MG tablet Take 1 tablet (10 mg total) by mouth at bedtime.  30 tablet  1  . clonazePAM (KLONOPIN) 1 MG tablet Take 1 tablet (1 mg total) by mouth 2 (two) times daily.  60 tablet  0  . cyclobenzaprine (FLEXERIL) 5 MG tablet       . dicyclomine (BENTYL) 10 MG capsule Take 1 tab twice daily as needed for cramping and abdominal pain.  60 capsule  4  . ibuprofen (ADVIL,MOTRIN) 800 MG tablet       . omeprazole (PRILOSEC) 40 MG capsule Take 1 tab twice daily for one month then go to once daily  180 capsule  3  .  polyethylene glycol powder (GLYCOLAX/MIRALAX) powder Take 17 g by mouth daily. Until daily soft stools  OTC      . promethazine (PHENERGAN) 25 MG tablet Take 1 tab by mouth every 6 hours as needed for nausea  90 tablet  3  . traZODone (DESYREL) 150 MG tablet Take 1 tablet (150 mg total) by mouth at bedtime.  90 tablet  1   No current facility-administered medications for this visit.    Review of Systems Review of Systems  Constitutional: Negative.   HENT: Negative.   Respiratory: Negative.   Cardiovascular: Negative.   Gastrointestinal: Positive for nausea, vomiting and abdominal pain.  Neurological: Negative.   All other systems reviewed and are negative.   Blood pressure 118/70, pulse 70, temperature 98.3 F (36.8 C), resp. rate 12, height 5\' 2"  (1.575 m), weight 140 lb (63.504 kg), last menstrual period  02/12/2014.  Physical Exam Physical Exam  Constitutional: She is oriented to person, place, and time. She appears well-developed and well-nourished.  HENT:  Head: Normocephalic and atraumatic.  Eyes: Conjunctivae and EOM are normal. Pupils are equal, round, and reactive to light.  Neck: Normal range of motion. Neck supple.  Cardiovascular: Normal rate, regular rhythm and normal heart sounds.   Pulmonary/Chest: Effort normal and breath sounds normal.  Abdominal: Soft. Bowel sounds are normal. She exhibits no distension and no mass. There is tenderness. There is no rebound and no guarding.  Musculoskeletal: Normal range of motion.  Neurological: She is alert and oriented to person, place, and time.  Skin: Skin is warm and dry.  Psychiatric: She has a normal mood and affect.    Data Reviewed As above, polyp 1.5 mm  Assessment    48 year old female with gallbladder polyp     Plan    1. I believe secondary to size there is an increased chance of possible cancer associated with the polyp. At this point I would recommend removing her gallbladder secondary to her symptoms as well as a gallbladder polyp. 2. We'll proceed to the operating room for laparoscopic cholecystectomy. 3. All risks and benefits were discussed with the patient to generally include: infection, bleeding, possible need for post op ERCP, damage to the bile ducts, and bile leak. Alternatives were offered and described.  All questions were answered and the patient voiced understanding of the procedure and wishes to proceed at this point with a laparoscopic cholecystectomy         Ralene Ok 05/24/2014, 3:12 PM

## 2014-06-07 NOTE — Progress Notes (Signed)
Pt did not have  IV on arrival to short stay. Only the one we started in right hand today.

## 2014-06-07 NOTE — Anesthesia Procedure Notes (Signed)
Procedure Name: Intubation Date/Time: 06/07/2014 10:50 AM Performed by: Rush Farmer E Pre-anesthesia Checklist: Patient identified, Emergency Drugs available, Suction available, Patient being monitored and Timeout performed Patient Re-evaluated:Patient Re-evaluated prior to inductionOxygen Delivery Method: Circle system utilized Preoxygenation: Pre-oxygenation with 100% oxygen Intubation Type: IV induction Ventilation: Mask ventilation without difficulty Laryngoscope Size: Mac and 3 Grade View: Grade I Tube type: Oral Number of attempts: 1 Airway Equipment and Method: Stylet Placement Confirmation: ETT inserted through vocal cords under direct vision,  breath sounds checked- equal and bilateral and positive ETCO2 Secured at: 21 cm Tube secured with: Tape Dental Injury: Teeth and Oropharynx as per pre-operative assessment

## 2014-06-07 NOTE — Anesthesia Postprocedure Evaluation (Signed)
Anesthesia Post Note  Patient: Ashley [name]  Procedure(s) Performed: Procedure(s) (LRB): LAPAROSCOPIC CHOLECYSTECTOMY (N/A)  Anesthesia type: General  Patient location: PACU  Post pain: Pain level controlled  Post assessment: Patient's Cardiovascular Status Stable  Last Vitals:  Filed Vitals:   06/07/14 1315  BP:   Pulse: 73  Temp: 36.7 C  Resp: 10    Post vital signs: Reviewed and stable  Level of consciousness: alert  Complications: No apparent anesthesia complications

## 2014-06-07 NOTE — Anesthesia Preprocedure Evaluation (Addendum)
Anesthesia Evaluation  Patient identified by MRN, date of birth, ID band Patient awake    Reviewed: Allergy & Precautions, H&P , NPO status , Patient's Chart, lab work & pertinent test results, reviewed documented beta blocker date and time   Airway Mallampati: II TM Distance: >3 FB Neck ROM: full    Dental  (+) Missing, Poor Dentition   Pulmonary neg pulmonary ROS, former smoker,  breath sounds clear to auscultation        Cardiovascular negative cardio ROS  Rhythm:regular     Neuro/Psych  Headaches, PSYCHIATRIC DISORDERS    GI/Hepatic Neg liver ROS, GERD-  Medicated,  Endo/Other  diabetes, Oral Hypoglycemic Agents  Renal/GU negative Renal ROS  negative genitourinary   Musculoskeletal   Abdominal   Peds  Hematology negative hematology ROS (+)   Anesthesia Other Findings See surgeon's H&P   Reproductive/Obstetrics negative OB ROS                          Anesthesia Physical Anesthesia Plan  ASA: II  Anesthesia Plan: General   Post-op Pain Management:    Induction: Intravenous  Airway Management Planned: Oral ETT  Additional Equipment:   Intra-op Plan:   Post-operative Plan: Extubation in OR  Informed Consent: I have reviewed the patients History and Physical, chart, labs and discussed the procedure including the risks, benefits and alternatives for the proposed anesthesia with the patient or authorized representative who has indicated his/her understanding and acceptance.   Dental Advisory Given  Plan Discussed with: CRNA and Surgeon  Anesthesia Plan Comments:         Anesthesia Quick Evaluation

## 2014-06-07 NOTE — Progress Notes (Signed)
Called Bonnita Nasuti to send family

## 2014-06-07 NOTE — Progress Notes (Signed)
Report to Shirley RN for lunch relief. 

## 2014-06-07 NOTE — Transfer of Care (Signed)
Immediate Anesthesia Transfer of Care Note  Patient: Ashley [name]  Procedure(s) Performed: Procedure(s): LAPAROSCOPIC CHOLECYSTECTOMY (N/A)  Patient Location: PACU  Anesthesia Type:General  Level of Consciousness: awake, alert  and oriented  Airway & Oxygen Therapy: Patient Spontanous Breathing and Patient connected to nasal cannula oxygen  Post-op Assessment: Report given to PACU RN, Post -op Vital signs reviewed and stable and Patient moving all extremities X 4  Post vital signs: Reviewed and stable  Complications: No apparent anesthesia complications

## 2014-06-07 NOTE — Interval H&P Note (Signed)
History and Physical Interval Note:  06/07/2014 9:51 AM  Ashley [name]  has presented today for surgery, with the diagnosis of gallbladder polyp  The various methods of treatment have been discussed with the patient and family. After consideration of risks, benefits and other options for treatment, the patient has consented to  Procedure(s): LAPAROSCOPIC CHOLECYSTECTOMY (N/A) as a surgical intervention .  The patient's history has been reviewed, patient examined, no change in status, stable for surgery.  I have reviewed the patient's chart and labs.  Questions were answered to the patient's satisfaction.     Rosario Jacks., Anne Hahn

## 2014-06-07 NOTE — Discharge Instructions (Signed)
[facility] ______[facility] °LAPAROSCOPIC SURGERY: POST OP INSTRUCTIONS °Always review your discharge instruction sheet given to you by the facility where your surgery was performed. °IF YOU HAVE DISABILITY OR FAMILY LEAVE FORMS, YOU MUST BRING THEM TO THE OFFICE FOR PROCESSING.   °DO NOT GIVE THEM TO YOUR DOCTOR. ° °1. A prescription for pain medication may be given to you upon discharge.  Take your pain medication as prescribed, if needed.  If narcotic pain medicine is not needed, then you may take acetaminophen (Tylenol) or ibuprofen (Advil) as needed. °2. Take your usually prescribed medications unless otherwise directed. °3. If you need a refill on your pain medication, please contact your pharmacy.  They will contact our office to request authorization. Prescriptions will not be filled after 5pm or on week-ends. °4. You should follow a light diet the first few days after arrival home, such as soup and crackers, etc.  Be sure to include lots of fluids daily. °5. Most patients will experience some swelling and bruising in the area of the incisions.  Ice packs will help.  Swelling and bruising can take several days to resolve.  °6. It is common to experience some constipation if taking pain medication after surgery.  Increasing fluid intake and taking a stool softener (such as Colace) will usually help or prevent this problem from occurring.  A mild laxative (Milk of Magnesia or Miralax) should be taken according to package instructions if there are no bowel movements after 48 hours. °7. Unless discharge instructions indicate otherwise, you may remove your bandages 24-48 hours after surgery, and you may shower at that time.  You may have steri-strips (small skin tapes) in place directly over the incision.  These strips should be left on the skin for 7-10 days.  If your surgeon used skin glue on the incision, you may shower in 24 hours.  The glue will flake off over the next 2-3 weeks.  Any sutures or  staples will be removed at the office during your follow-up visit. °8. ACTIVITIES:  You may resume regular (light) daily activities beginning the next day--such as daily self-care, walking, climbing stairs--gradually increasing activities as tolerated.  You may have sexual intercourse when it is comfortable.  Refrain from any heavy lifting or straining until approved by your doctor. °a. You may drive when you are no longer taking prescription pain medication, you can comfortably wear a seatbelt, and you can safely maneuver your car and apply brakes. °b. RETURN TO WORK:  __________________________________________________________ °9. You should see your doctor in the office for a follow-up appointment approximately 2-3 weeks after your surgery.  Make sure that you call for this appointment within a day or two after you arrive home to insure a convenient appointment time. °10. OTHER INSTRUCTIONS: __________________________________________________________________________________________________________________________ __________________________________________________________________________________________________________________________ °WHEN TO CALL YOUR DOCTOR: °1. Fever over 101.0 °2. Inability to urinate °3. Continued bleeding from incision. °4. Increased pain, redness, or drainage from the incision. °5. Increasing abdominal pain ° °The clinic staff is available to answer your questions during regular business hours.  Please don’t hesitate to call and ask to speak to one of the nurses for clinical concerns.  If you have a medical emergency, go to the nearest emergency room or call 911.  A surgeon from [facility] is always on call at the hospital. °[address], [address], [place], [place]  [address] ? [address] [address], [place], [place]   [address] °[phone] ? [phone] ? FAX [phone] °Web site:   [email or web address] ° °What to eat: ° °For your first meals, you should eat  lightly; only small meals initially.  If you do not have nausea, you may eat larger meals.  Avoid spicy, greasy and heavy food.   ° °General Anesthesia, Adult, Care After  °Refer to this sheet in the next few weeks. These instructions provide you with information on caring for yourself after your procedure. Your health care provider may also give you more specific instructions. Your treatment has been planned according to current medical practices, but problems sometimes occur. Call your health care provider if you have any problems or questions after your procedure.  °WHAT TO EXPECT AFTER THE PROCEDURE  °After the procedure, it is typical to experience:  °Sleepiness.  °Nausea and vomiting. °HOME CARE INSTRUCTIONS  °For the first 24 hours after general anesthesia:  °Have a responsible person with you.  °Do not drive a car. If you are alone, do not take public transportation.  °Do not drink alcohol.  °Do not take medicine that has not been prescribed by your health care provider.  °Do not sign important papers or make important decisions.  °You may resume a normal diet and activities as directed by your health care provider.  °Change bandages (dressings) as directed.  °If you have questions or problems that seem related to general anesthesia, call the hospital and ask for the anesthetist or anesthesiologist on call. °SEEK MEDICAL CARE IF:  °You have nausea and vomiting that continue the day after anesthesia.  °You develop a rash. °SEEK IMMEDIATE MEDICAL CARE IF:  °You have difficulty breathing.  °You have chest pain.  °You have any allergic problems. °Document Released: 03/18/2001 Document Revised: 08/12/2013 Document Reviewed: 06/25/2013  °Exit[employer]® Patient Information ©2014 [employer].  ° ° °

## 2014-06-09 ENCOUNTER — Encounter (HOSPITAL_COMMUNITY): Payer: Self-pay | Admitting: General Surgery

## 2014-06-21 ENCOUNTER — Ambulatory Visit: Payer: No Typology Code available for payment source | Admitting: Internal Medicine

## 2014-06-23 ENCOUNTER — Encounter (INDEPENDENT_AMBULATORY_CARE_PROVIDER_SITE_OTHER): Payer: No Typology Code available for payment source | Admitting: General Surgery

## 2014-07-02 ENCOUNTER — Other Ambulatory Visit: Payer: Self-pay

## 2014-07-02 MED ORDER — CLONAZEPAM 1 MG PO TABS: 1.0000 mg | ORAL_TABLET | Freq: Two times a day (BID) | ORAL | Status: DC

## 2014-07-02 NOTE — Telephone Encounter (Signed)
Pt left v/m requesting refill clonazepam to [facility].Please advise.

## 2014-07-02 NOTE — Telephone Encounter (Signed)
Spoke to pt and informed her Rx has been called in to requested pharmacy 

## 2014-07-22 ENCOUNTER — Encounter: Payer: Self-pay | Admitting: Family Medicine

## 2014-07-22 ENCOUNTER — Ambulatory Visit (INDEPENDENT_AMBULATORY_CARE_PROVIDER_SITE_OTHER): Payer: No Typology Code available for payment source | Admitting: Family Medicine

## 2014-07-22 VITALS — BP 108/72 | HR 69 | Temp 98.2°F | Wt 140.0 lb

## 2014-07-22 DIAGNOSIS — R071 Chest pain on breathing: Secondary | ICD-10-CM

## 2014-07-22 DIAGNOSIS — R0789 Other chest pain: Secondary | ICD-10-CM

## 2014-07-22 NOTE — Progress Notes (Signed)
Pre visit review using our clinic review tool, if applicable. No additional management support is needed unless otherwise documented below in the visit note.  Pain on the R side of the chest, to the back and in the medial R upper arm.  Present for 3 days.  Worse with a deep breath.  Laying on L side makes it worse.  No trauma.  No rash.  No bruising.  No L sided pain.  Some cough, worse with a cough.  No neck pain. No abd pain.  No FCNVAD.  Has hot flashes since hysterectomy.  Still taking her baseline meds.  Stopped smoking.  6 weeks out from cholecystectomy for a gall bladder polyp.  After about 1 week after surgery she was back to baseline.  No pain at the surgery site.    Pulling in chest as she stands, but not painful walking.  Painful twisting, to either side.  Tried heating pad, that helped a little.   Meds, vitals, and allergies reviewed.   ROS: See HPI.  Otherwise, noncontributory.  nad ncat Neck supple, no LA rrr ctab abd soft, not ttp L chest wall ttp anterior and posterior Pain with a deep breath. ttp along the L trap but not the midline of back.  L pec ttp

## 2014-07-22 NOTE — Patient Instructions (Signed)
This looks to be a pulled muscle (or actually multiple muscles).   Use a heating pad.  Try to limit lifting.  Ibuprofen 200mg /tab.  3 tabs 3 times a day with food.

## 2014-07-23 DIAGNOSIS — R079 Chest pain, unspecified: Secondary | ICD-10-CM | POA: Insufficient documentation

## 2014-07-23 NOTE — Assessment & Plan Note (Signed)
Likely a strain, could have been from picking up her grandkids.  Doesn't appear to be in the chest itself.  D/w pt. Ibuprofen, heat, should resolve.  F/u prn. Reassuring exam.  She agrees.

## 2014-08-01 ENCOUNTER — Other Ambulatory Visit: Payer: Self-pay | Admitting: Family Medicine

## 2014-08-03 ENCOUNTER — Other Ambulatory Visit: Payer: Self-pay

## 2014-08-03 MED ORDER — CLONAZEPAM 1 MG PO TABS: 1.0000 mg | ORAL_TABLET | Freq: Two times a day (BID) | ORAL | Status: DC

## 2014-08-03 NOTE — Telephone Encounter (Signed)
Rx called in to requested pharmacy 

## 2014-08-03 NOTE — Telephone Encounter (Signed)
Pt left v/m requesting refill klonopin  To [facility].Please advise.

## 2014-08-09 ENCOUNTER — Other Ambulatory Visit (INDEPENDENT_AMBULATORY_CARE_PROVIDER_SITE_OTHER): Payer: No Typology Code available for payment source

## 2014-08-09 ENCOUNTER — Other Ambulatory Visit: Payer: Self-pay | Admitting: Family Medicine

## 2014-08-09 ENCOUNTER — Encounter: Payer: Self-pay | Admitting: Radiology

## 2014-08-09 DIAGNOSIS — R739 Hyperglycemia, unspecified: Secondary | ICD-10-CM

## 2014-08-09 DIAGNOSIS — R7309 Other abnormal glucose: Secondary | ICD-10-CM

## 2014-08-09 LAB — HEMOGLOBIN A1C: HEMOGLOBIN A1C: 5.9 % (ref 4.6–6.5)

## 2014-08-09 MED ORDER — AMITRIPTYLINE HCL 10 MG PO TABS: 10.0000 mg | ORAL_TABLET | Freq: Every day | ORAL | Status: DC

## 2014-08-09 NOTE — Telephone Encounter (Signed)
Pt came into for labs and stated she needed to get her amitriptyline refilled    [facility]

## 2014-08-10 ENCOUNTER — Encounter: Payer: Self-pay | Admitting: *Deleted

## 2014-08-11 ENCOUNTER — Encounter: Payer: Self-pay | Admitting: Family Medicine

## 2014-08-11 ENCOUNTER — Ambulatory Visit (INDEPENDENT_AMBULATORY_CARE_PROVIDER_SITE_OTHER): Payer: No Typology Code available for payment source | Admitting: Family Medicine

## 2014-08-11 VITALS — BP 110/62 | HR 66 | Temp 98.0°F | Wt 141.0 lb

## 2014-08-11 DIAGNOSIS — G43819 Other migraine, intractable, without status migrainosus: Secondary | ICD-10-CM

## 2014-08-11 DIAGNOSIS — R7309 Other abnormal glucose: Secondary | ICD-10-CM

## 2014-08-11 DIAGNOSIS — R739 Hyperglycemia, unspecified: Secondary | ICD-10-CM

## 2014-08-11 DIAGNOSIS — R109 Unspecified abdominal pain: Secondary | ICD-10-CM

## 2014-08-11 DIAGNOSIS — R1011 Right upper quadrant pain: Secondary | ICD-10-CM

## 2014-08-11 MED ORDER — METFORMIN HCL 500 MG PO TABS: 500.0000 mg | ORAL_TABLET | Freq: Every day | ORAL | Status: DC

## 2014-08-11 MED ORDER — AMITRIPTYLINE HCL 25 MG PO TABS: 25.0000 mg | ORAL_TABLET | Freq: Every day | ORAL | Status: DC

## 2014-08-11 MED ORDER — TRAZODONE HCL 150 MG PO TABS: 150.0000 mg | ORAL_TABLET | Freq: Every day | ORAL | Status: DC

## 2014-08-11 NOTE — Patient Instructions (Signed)
Good to see you. We are restarting Metformin 500 mg daily with breakfast AND increasing your amitriptyline to 25 mg nightly.  Please call me in 2 weeks with an update and return for labs in 3 months.

## 2014-08-11 NOTE — Progress Notes (Signed)
Pre visit review using our clinic review tool, if applicable. No additional management support is needed unless otherwise documented below in the visit note. 

## 2014-08-11 NOTE — Assessment & Plan Note (Signed)
Improved s/p lap cholecystectomy. No further work up or tx necessary.

## 2014-08-11 NOTE — Assessment & Plan Note (Addendum)
Deteriorated. a1c stable but fasting FSBS increasing and she feels symptomatic. Will restart low dose Metformin - 500 mg daily with breakfast. Continue checking fasting FSBS daily. Call me in 3 weeks with an update. Follow up a1c in 3 months. The patient indicates understanding of these issues and agrees with the plan.

## 2014-08-11 NOTE — Assessment & Plan Note (Signed)
Deteriorated. Increase elavil to 25 mg qhs. Call or return to clinic prn if these symptoms worsen or fail to improve as anticipated.

## 2014-08-11 NOTE — Progress Notes (Signed)
Subjective:   Patient ID: Ashley [name], female    DOB: Jan 26, 1966, 48 y.o.   MRN: [id number]  Ashley [name] is a pleasant 48 y.o. year old female who presents to clinic today with Follow-up and Headache  on 08/11/2014  HPI: Borderline DM- has been taking Metformin stopped taking three months ago since a1c were stable. a1c stable this month as well but fasting FSBS elevated. She checks fasting FSBS every morning.  They have been running between 130-180. She feels like it has been elevated- more dizzy and sweaty in am. Lab Results  Component Value Date   HGBA1C 5.9 08/09/2014    Migraine headache- saw Dr. Diona Browner for this complaint on 03/23/14- note reviewed. Intractable- treated with IM toradol and phenergan.  Having a migraine at least once a week.  Seemed to get better after her hysterectomy in 03/2014.  Started getting more frequent three weeks ago.  Taking Elavil for prophylaxis nightly.  Triptans ineffective for her.  They are associated with nausea and vomiting.  Could not tolerative topamax (hand and feet tingling).  Does not currently have a migraine today.  S/p lap cholecystectomy on 05/24/14.  Abdominal pain has resolved.  Current Outpatient Prescriptions on File Prior to Visit  Medication Sig Dispense Refill  . clonazePAM (KLONOPIN) 1 MG tablet Take 1 tablet (1 mg total) by mouth 2 (two) times daily.  60 tablet  0  . dicyclomine (BENTYL) 10 MG capsule Take 1 tab twice daily as needed for cramping and abdominal pain.  60 capsule  4  . omeprazole (PRILOSEC) 40 MG capsule Take 40 mg by mouth daily before breakfast. Take 1 tab twice daily for one month then go to once daily      . polyethylene glycol powder (GLYCOLAX/MIRALAX) powder Take 17 g by mouth daily. Until daily soft stools  OTC      . promethazine (PHENERGAN) 25 MG tablet Take 1 tab by mouth every 6 hours as needed for nausea  90 tablet  3   No current facility-administered medications on file prior to visit.    Allergies    Allergen Reactions  . Penicillins Anaphylaxis    REACTION: Throat swells  . Topamax [Topiramate]     Hands and feet to go numb    Past Medical History  Diagnosis Date  . Anxiety   . Tobacco abuse   . Cataract     bilateral  . SVD (spontaneous vaginal delivery)     x 3  . Bronchitis     uses inhaler if needed for bronchitis, lasted used -2014  . GERD (gastroesophageal reflux disease)   . History of blood transfusion [birth year] 28, 1966    at birth in [place], [place], unsure number of units  . Diabetes mellitus without complication     taken off Metformin since 04/2014, HgbA1C - normal, will follow up with PCP- Dr. Deborra Medina, 07/2014  . H/O exercise stress test 2011    done at [facility]- told that it was WNL, done due to pt. having panic attacks   . Chronic headaches     migraines - in past, uses Phenergan for nausea   . Poor dentition     very poor oral health     Past Surgical History  Procedure Laterality Date  . Appendectomy  1988  . Tubal ligation    . Esophagogastroduodenoscopy  12/28/2013  . Knee arthroscopy  1995    left  . Colonoscopy  02/16/2014    normal   .  Laparoscopic assisted vaginal hysterectomy N/A 04/15/2014    Procedure: LAPAROSCOPIC ASSISTED VAGINAL HYSTERECTOMY;  Surgeon: Marylynn Pearson, MD;  Location: [facility];  Service: Gynecology;  Laterality: N/A;  . Laparoscopic bilateral salpingo oopherectomy Bilateral 04/15/2014    Procedure: LAPAROSCOPIC BILATERAL SALPINGO OOPHORECTOMY;  Surgeon: Marylynn Pearson, MD;  Location: [facility];  Service: Gynecology;  Laterality: Bilateral;  . Abdominal hysterectomy    . Cholecystectomy N/A 06/07/2014    Procedure: LAPAROSCOPIC CHOLECYSTECTOMY;  Surgeon: Ralene Ok, MD;  Location: [facility];  Service: General;  Laterality: N/A;  . Cholecystectomy  June 07 2014    Family History  Problem Relation Age of Onset  . Diabetes Mother   . Hyperlipidemia Mother   . Hypertension Mother   . Colon cancer Neg Hx   . Rectal cancer Neg Hx   .  Stomach cancer Neg Hx   . Prostate cancer Father   . Lung cancer Maternal Grandfather     History   Social History  . Marital Status: Married    Spouse Name: N/A    Number of Children: 6  . Years of Education: N/A   Occupational History  .       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 Smoking status: Former Smoker -- 1.00 packs/day for 35 years    Types: Cigarettes, E-cigarettes    Quit date: 02/02/2014  . Smokeless tobacco: Never Used  . Alcohol Use: No  . Drug Use: No  . Sexual Activity: Yes    Birth Control/ Protection: Pill   Other Topics Concern  . Not on file   Social History Narrative   5 living children, one child is a crack cocaine addict who often breaks into their house to steal money. One child died of cerebral palsy.   The PMH, PSH, Social History, Family History, Medications, and allergies have been reviewed in [facility], and have been updated if relevant.  Review of Systems See HPI +nausea and vomiting when she has a migraine No CP or SOB No blurred vision No abdominal pain No changes in bowel habits No anxiety or depression    Objective:    BP 110/62  Pulse 66  Temp(Src) 98 F (36.7 C) (Oral)  Wt 141 lb (63.957 kg)  SpO2 96%  LMP 02/12/2014   Physical Exam  Nursing note and vitals reviewed. Constitutional: She appears well-developed and well-nourished. No distress.  HENT:  Head: Normocephalic.  Eyes: Pupils are equal, round, and reactive to light.  Neck: Normal range of motion.  Cardiovascular: Normal rate and regular rhythm.   Pulmonary/Chest: Effort normal and breath sounds normal.  Abdominal: Soft.  Skin: Skin is warm and dry.  Psychiatric: She has a normal mood and affect. Her behavior is normal. Judgment and thought content normal.          Assessment & Plan:   Hyperglycemia  Other type of intractable migraine  Abdominal pain, unspecified site No Follow-up on file.

## 2014-08-12 ENCOUNTER — Ambulatory Visit (INDEPENDENT_AMBULATORY_CARE_PROVIDER_SITE_OTHER): Payer: No Typology Code available for payment source | Admitting: Internal Medicine

## 2014-08-12 ENCOUNTER — Encounter: Payer: Self-pay | Admitting: Internal Medicine

## 2014-08-12 VITALS — BP 106/76 | HR 75 | Temp 98.4°F | Wt 138.5 lb

## 2014-08-12 DIAGNOSIS — G43119 Migraine with aura, intractable, without status migrainosus: Secondary | ICD-10-CM

## 2014-08-12 MED ORDER — KETOROLAC TROMETHAMINE 60 MG/2ML IM SOLN
60.0000 mg | Freq: Once | INTRAMUSCULAR | Status: AC
  Administered 2014-08-12: 60 mg via INTRAMUSCULAR

## 2014-08-12 MED ORDER — PROMETHAZINE HCL 25 MG/ML IJ SOLN
25.0000 mg | Freq: Once | INTRAMUSCULAR | Status: AC
  Administered 2014-08-12: 25 mg via INTRAMUSCULAR

## 2014-08-12 NOTE — Assessment & Plan Note (Signed)
Not severe but persistent Discussed reducing daily caffeine Needs to Rx at visual aura---even with ibuprofen and phenergan Will give toradol and phenergan IM today since this has helped her

## 2014-08-12 NOTE — Progress Notes (Signed)
Pre visit review using our clinic review tool, if applicable. No additional management support is needed unless otherwise documented below in the visit note. 

## 2014-08-12 NOTE — Progress Notes (Signed)
Subjective:    Patient ID: Ashley [name], female    DOB: 20-Apr-1966, 48 y.o.   MRN: [id number]  HPI Was having a headache yesterday at visit but couldn't take injection since she was driving Started 8:10 yesterday am  Right temporal pain  Tends to occur about 30 minutes after a squiggly line in that visual field Stabbing pain Some sono and photophobia Terrible nausea so she can't eat--- but no vomiting  Ibuprofen 800mg  today--no help Tried phenergan last night--didn't help her sleep so didn't continue it Has kept up with her usual caffeine (3 cups coffee, Dr Malachi Bonds and tea)  Current Outpatient Prescriptions on File Prior to Visit  Medication Sig Dispense Refill  . amitriptyline (ELAVIL) 25 MG tablet Take 1 tablet (25 mg total) by mouth at bedtime.  30 tablet  1  . clonazePAM (KLONOPIN) 1 MG tablet Take 1 tablet (1 mg total) by mouth 2 (two) times daily.  60 tablet  0  . dicyclomine (BENTYL) 10 MG capsule Take 1 tab twice daily as needed for cramping and abdominal pain.  60 capsule  4  . metFORMIN (GLUCOPHAGE) 500 MG tablet Take 1 tablet (500 mg total) by mouth daily with breakfast.  90 tablet  3  . omeprazole (PRILOSEC) 40 MG capsule Take 40 mg by mouth daily before breakfast. Take 1 tab twice daily for one month then go to once daily      . polyethylene glycol powder (GLYCOLAX/MIRALAX) powder Take 17 g by mouth daily. Until daily soft stools  OTC      . promethazine (PHENERGAN) 25 MG tablet Take 1 tab by mouth every 6 hours as needed for nausea  90 tablet  3  . traZODone (DESYREL) 150 MG tablet Take 1 tablet (150 mg total) by mouth at bedtime.  90 tablet  0   No current facility-administered medications on file prior to visit.    Allergies  Allergen Reactions  . Penicillins Anaphylaxis    REACTION: Throat swells  . Topamax [Topiramate]     Hands and feet to go numb    Past Medical History  Diagnosis Date  . Anxiety   . Tobacco abuse   . Cataract     bilateral  . SVD  (spontaneous vaginal delivery)     x 3  . Bronchitis     uses inhaler if needed for bronchitis, lasted used -2014  . GERD (gastroesophageal reflux disease)   . History of blood transfusion 09/09/1966    at birth in [place], [place], unsure number of units  . Diabetes mellitus without complication     taken off Metformin since 04/2014, HgbA1C - normal, will follow up with PCP- Dr. Deborra Medina, 07/2014  . H/O exercise stress test 2011    done at [facility]- told that it was WNL, done due to pt. having panic attacks   . Chronic headaches     migraines - in past, uses Phenergan for nausea   . Poor dentition     very poor oral health     Past Surgical History  Procedure Laterality Date  . Appendectomy  1988  . Tubal ligation    . Esophagogastroduodenoscopy  12/28/2013  . Knee arthroscopy  1995    left  . Colonoscopy  02/16/2014    normal   . Laparoscopic assisted vaginal hysterectomy N/A 04/15/2014    Procedure: LAPAROSCOPIC ASSISTED VAGINAL HYSTERECTOMY;  Surgeon: Marylynn Pearson, MD;  Location: [facility];  Service: Gynecology;  Laterality: N/A;  .  Laparoscopic bilateral salpingo oopherectomy Bilateral 04/15/2014    Procedure: LAPAROSCOPIC BILATERAL SALPINGO OOPHORECTOMY;  Surgeon: Marylynn Pearson, MD;  Location: [facility];  Service: Gynecology;  Laterality: Bilateral;  . Abdominal hysterectomy    . Cholecystectomy N/A 06/07/2014    Procedure: LAPAROSCOPIC CHOLECYSTECTOMY;  Surgeon: Ralene Ok, MD;  Location: [facility];  Service: General;  Laterality: N/A;  . Cholecystectomy  June 07 2014    Family History  Problem Relation Age of Onset  . Diabetes Mother   . Hyperlipidemia Mother   . Hypertension Mother   . Colon cancer Neg Hx   . Rectal cancer Neg Hx   . Stomach cancer Neg Hx   . Prostate cancer Father   . Lung cancer Maternal Grandfather     History   Social History  . Marital Status: Married    Spouse Name: N/A    Number of Children: 6  . Years of Education: N/A   Occupational  History  .       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 Smoking status: Former Smoker -- 1.00 packs/day for 35 years    Types: Cigarettes, E-cigarettes    Quit date: 02/02/2014  . Smokeless tobacco: Never Used  . Alcohol Use: No  . Drug Use: No  . Sexual Activity: Yes    Birth Control/ Protection: Pill   Other Topics Concern  . Not on file   Social History Narrative   5 living children, one child is a crack cocaine addict who often breaks into their house to steal money. One child died of cerebral palsy.   Review of Systems No arm or leg weakness Vision is blurry but no vision loss     Objective:   Physical Exam  Constitutional: She appears well-developed. No distress.  Looks mildly uncomfortable  HENT:  Mouth/Throat: Oropharynx is clear and moist. No oropharyngeal exudate.  Eyes: Conjunctivae and EOM are normal.  Neck: Normal range of motion. Neck supple. No thyromegaly present.  Lymphadenopathy:    She has no cervical adenopathy.  Neurological: She is alert. She has normal strength. No cranial nerve deficit. She exhibits normal muscle tone. Coordination and gait normal.  Psychiatric: Her behavior is normal.          Assessment & Plan:

## 2014-08-12 NOTE — Patient Instructions (Signed)
Please try to cut back on your caffeine. Try excedrin migraine at the onset of the visual changes, then ibuprofen and phenergan, when your headaches come on.

## 2014-08-12 NOTE — Addendum Note (Signed)
Addended by: Tammi Sou on: 08/12/2014 04:45 PM   Modules accepted: Orders

## 2014-08-27 ENCOUNTER — Other Ambulatory Visit: Payer: Self-pay

## 2014-08-27 MED ORDER — DICYCLOMINE HCL 10 MG PO CAPS: ORAL_CAPSULE | ORAL | Status: DC

## 2014-08-27 NOTE — Telephone Encounter (Signed)
Pt left v/m; Bentyl has been prescribed by GI previously but pt no longer sees GI; pt wants to know if Dr Deborra Medina will refill Bentyl or should pt continue to take Bentyl. Pt request [email or web address].

## 2014-08-31 ENCOUNTER — Encounter: Payer: Self-pay | Admitting: Family Medicine

## 2014-09-06 ENCOUNTER — Other Ambulatory Visit: Payer: Self-pay

## 2014-09-06 MED ORDER — CLONAZEPAM 1 MG PO TABS: 1.0000 mg | ORAL_TABLET | Freq: Two times a day (BID) | ORAL | Status: DC

## 2014-09-06 NOTE — Telephone Encounter (Signed)
Rx called in to requested pharmacy 

## 2014-09-06 NOTE — Telephone Encounter (Signed)
Pt left v/m requesting refill clonazepam to [facility].Please advise.

## 2014-09-28 ENCOUNTER — Encounter: Payer: Self-pay | Admitting: Family Medicine

## 2014-09-28 ENCOUNTER — Ambulatory Visit (INDEPENDENT_AMBULATORY_CARE_PROVIDER_SITE_OTHER)
Admission: RE | Admit: 2014-09-28 | Discharge: 2014-09-28 | Disposition: A | Discharge status: Home / Self Care | Payer: No Typology Code available for payment source | Source: Ambulatory Visit | Attending: Family Medicine | Admitting: Family Medicine

## 2014-09-28 ENCOUNTER — Ambulatory Visit (INDEPENDENT_AMBULATORY_CARE_PROVIDER_SITE_OTHER): Payer: No Typology Code available for payment source | Admitting: Family Medicine

## 2014-09-28 VITALS — BP 116/72 | HR 88 | Temp 98.0°F | Wt 140.0 lb

## 2014-09-28 DIAGNOSIS — Z23 Encounter for immunization: Secondary | ICD-10-CM

## 2014-09-28 DIAGNOSIS — M545 Low back pain, unspecified: Secondary | ICD-10-CM | POA: Insufficient documentation

## 2014-09-28 DIAGNOSIS — G43119 Migraine with aura, intractable, without status migrainosus: Secondary | ICD-10-CM

## 2014-09-28 DIAGNOSIS — M5410 Radiculopathy, site unspecified: Secondary | ICD-10-CM

## 2014-09-28 DIAGNOSIS — G43101 Migraine with aura, not intractable, with status migrainosus: Secondary | ICD-10-CM

## 2014-09-28 NOTE — Patient Instructions (Signed)
Great to see you.  Please stop by to see Ashley [name] on your way out to set up your appointment with the headache clinic.

## 2014-09-28 NOTE — Assessment & Plan Note (Signed)
>  25 minutes spent in face to face time with patient, >50% spent in counselling or coordination of care She clearly needs prophylaxis.  ?another trial of a betablocker. Advised HA journal, will also refer to [facility] given refractory and progressive nature of her migraines. The patient indicates understanding of these issues and agrees with the plan.

## 2014-09-28 NOTE — Progress Notes (Signed)
Pre visit review using our clinic review tool, if applicable. No additional management support is needed unless otherwise documented below in the visit note. 

## 2014-09-28 NOTE — Assessment & Plan Note (Signed)
New- given findings on clinical exam, will proceed with xray to rule out disc pathology. The patient indicates understanding of these issues and agrees with the plan.

## 2014-09-28 NOTE — Progress Notes (Signed)
Subjective:   Patient ID: Ashley [name], female    DOB: Dec 26, 1965, 48 y.o.   MRN: [id number]  Ashley [name] is a pleasant 48 y.o. year old female who presents to clinic today with Headache  on 09/28/2014  HPI: Migraines-  Started 6 years ago.  Could not tolerate topomax. I increased her dose of elavil to 25 mg nightly in August and she feels this has not helped- still having 2-3 migraines per week. We were hopeful that hysterectomy would help since some seemed to be triggered by her menstrual cycle but actually having more headaches since her hysterectomy. Has been here multiple times to receive IM toradol and phenergan injections for intractable migraines- last saw Dr. Silvio Pate on 08/12/14.  Triptans are unfortunately ineffective for her.  Migraines are associated with an aura, nausea, vomiting and photophobia.  Back pain- no known injury but for 2 weeks, having progressive low back pain with left sided radiculopathy. No urinary symptoms.  No LE weakness. Current Outpatient Prescriptions on File Prior to Visit  Medication Sig Dispense Refill  . amitriptyline (ELAVIL) 25 MG tablet Take 1 tablet (25 mg total) by mouth at bedtime.  30 tablet  1  . clonazePAM (KLONOPIN) 1 MG tablet Take 1 tablet (1 mg total) by mouth 2 (two) times daily.  60 tablet  0  . dicyclomine (BENTYL) 10 MG capsule Take 1 tab twice daily as needed for cramping and abdominal pain.  60 capsule  4  . metFORMIN (GLUCOPHAGE) 500 MG tablet Take 1 tablet (500 mg total) by mouth daily with breakfast.  90 tablet  3  . omeprazole (PRILOSEC) 40 MG capsule Take 40 mg by mouth daily before breakfast. Take 1 tab twice daily for one month then go to once daily      . polyethylene glycol powder (GLYCOLAX/MIRALAX) powder Take 17 g by mouth daily. Until daily soft stools  OTC      . promethazine (PHENERGAN) 25 MG tablet Take 1 tab by mouth every 6 hours as needed for nausea  90 tablet  3  . traZODone (DESYREL) 150 MG tablet Take 1 tablet  (150 mg total) by mouth at bedtime.  90 tablet  0   No current facility-administered medications on file prior to visit.    Allergies  Allergen Reactions  . Penicillins Anaphylaxis    REACTION: Throat swells  . Topamax [Topiramate]     Hands and feet to go numb    Past Medical History  Diagnosis Date  . Anxiety   . Tobacco abuse   . Cataract     bilateral  . SVD (spontaneous vaginal delivery)     x 3  . Bronchitis     uses inhaler if needed for bronchitis, lasted used -2014  . GERD (gastroesophageal reflux disease)   . History of blood transfusion 03/12/1966    at birth in [place], [place], unsure number of units  . Diabetes mellitus without complication     taken off Metformin since 04/2014, HgbA1C - normal, will follow up with PCP- Dr. Deborra Medina, 07/2014  . H/O exercise stress test 2011    done at [facility]- told that it was WNL, done due to pt. having panic attacks   . Chronic headaches     migraines - in past, uses Phenergan for nausea   . Poor dentition     very poor oral health     Past Surgical History  Procedure Laterality Date  . Appendectomy  1988  .  Tubal ligation    . Esophagogastroduodenoscopy  12/28/2013  . Knee arthroscopy  1995    left  . Colonoscopy  02/16/2014    normal   . Laparoscopic assisted vaginal hysterectomy N/A 04/15/2014    Procedure: LAPAROSCOPIC ASSISTED VAGINAL HYSTERECTOMY;  Surgeon: Marylynn Pearson, MD;  Location: [facility];  Service: Gynecology;  Laterality: N/A;  . Laparoscopic bilateral salpingo oopherectomy Bilateral 04/15/2014    Procedure: LAPAROSCOPIC BILATERAL SALPINGO OOPHORECTOMY;  Surgeon: Marylynn Pearson, MD;  Location: [facility];  Service: Gynecology;  Laterality: Bilateral;  . Abdominal hysterectomy    . Cholecystectomy N/A 06/07/2014    Procedure: LAPAROSCOPIC CHOLECYSTECTOMY;  Surgeon: Ralene Ok, MD;  Location: [facility];  Service: General;  Laterality: N/A;  . Cholecystectomy  June 07 2014    Family History  Problem Relation Age  of Onset  . Diabetes Mother   . Hyperlipidemia Mother   . Hypertension Mother   . Colon cancer Neg Hx   . Rectal cancer Neg Hx   . Stomach cancer Neg Hx   . Prostate cancer Father   . Lung cancer Maternal Grandfather     History   Social History  . Marital Status: Married    Spouse Name: N/A    Number of Children: 6  . Years of Education: N/A   Occupational History  .       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 Smoking status: Former Smoker -- 1.00 packs/day for 35 years    Types: Cigarettes, E-cigarettes    Quit date: 02/02/2014  . Smokeless tobacco: Never Used  . Alcohol Use: No  . Drug Use: No  . Sexual Activity: Yes    Birth Control/ Protection: Pill   Other Topics Concern  . Not on file   Social History Narrative   5 living children, one child is a crack cocaine addict who often breaks into their house to steal money. One child died of cerebral palsy.   The PMH, PSH, Social History, Family History, Medications, and allergies have been reviewed in [facility], and have been updated if relevant.   Review of Systems    See HPI No blurred vision No dysarthria No night time awakenings +memory loss- feels that memory is worsening now with her worsening migraines-forgets things like turning off her car. No syncope No tremors  Objective:    BP 116/72  Pulse 88  Temp(Src) 98 F (36.7 C) (Oral)  Wt 140 lb (63.504 kg)  SpO2 96%  LMP 02/12/2014   Physical Exam  Gen:  Alert, pleasant, NAD MSK:   Mild TTP over left lumbar paraspinous musculature No pain on direct palpation of spine SLR pos bilaterally, neg fabers bilaterally Psych:  Good eye contact.  Not anxious or depressed appearing.      Assessment & Plan:   Migraine with aura and with status migrainosus, not intractable - Plan: AMB referral to headache clinic  Need for influenza vaccination - Plan: Flu Vaccine QUAD 36+ mos PF IM (Fluarix Quad PF)  Back pain with left-sided  radiculopathy - Plan: DG Lumbar Spine Complete No Follow-up on file.

## 2014-10-06 ENCOUNTER — Other Ambulatory Visit: Payer: Self-pay

## 2014-10-06 MED ORDER — CLONAZEPAM 1 MG PO TABS: 1.0000 mg | ORAL_TABLET | Freq: Two times a day (BID) | ORAL | Status: DC

## 2014-10-06 NOTE — Addendum Note (Signed)
Addended by: Modena Nunnery on: 10/06/2014 10:28 AM   Modules accepted: Orders

## 2014-10-06 NOTE — Telephone Encounter (Signed)
Pt left v/m requesting refill klonopin to [facility].Please advise.

## 2014-10-06 NOTE — Telephone Encounter (Signed)
Rx to be faxed to requested pharmacy before end of day, today.

## 2014-10-19 LAB — HM DIABETES EYE EXAM

## 2014-10-20 ENCOUNTER — Encounter: Payer: Self-pay | Admitting: Family Medicine

## 2014-10-22 ENCOUNTER — Encounter: Payer: Self-pay | Admitting: Family Medicine

## 2014-10-22 ENCOUNTER — Other Ambulatory Visit: Payer: Self-pay

## 2014-10-22 MED ORDER — AMITRIPTYLINE HCL 25 MG PO TABS: 25.0000 mg | ORAL_TABLET | Freq: Every day | ORAL | Status: DC

## 2014-10-22 NOTE — Telephone Encounter (Signed)
Pt left v/m requesting refill on amitriptyline to [facility]. Pt is out of med. Please advise.

## 2014-10-29 ENCOUNTER — Encounter: Payer: Self-pay | Admitting: Neurology

## 2014-10-29 ENCOUNTER — Ambulatory Visit (INDEPENDENT_AMBULATORY_CARE_PROVIDER_SITE_OTHER): Payer: No Typology Code available for payment source | Admitting: Neurology

## 2014-10-29 ENCOUNTER — Telehealth: Payer: Self-pay | Admitting: Neurology

## 2014-10-29 VITALS — BP 120/70 | HR 94 | Resp 16 | Ht 62.0 in | Wt 141.0 lb

## 2014-10-29 DIAGNOSIS — R292 Abnormal reflex: Secondary | ICD-10-CM

## 2014-10-29 DIAGNOSIS — R51 Headache: Secondary | ICD-10-CM

## 2014-10-29 DIAGNOSIS — R519 Headache, unspecified: Secondary | ICD-10-CM

## 2014-10-29 DIAGNOSIS — R200 Anesthesia of skin: Secondary | ICD-10-CM

## 2014-10-29 MED ORDER — ZOLMITRIPTAN 2.5 MG NA SOLN: NASAL | Status: DC

## 2014-10-29 MED ORDER — AMITRIPTYLINE HCL 25 MG PO TABS
ORAL_TABLET | ORAL | Status: DC
Start: 2014-10-29 — End: 2015-01-04

## 2014-10-29 MED ORDER — SUMATRIPTAN 5 MG/ACT NA SOLN: NASAL | Status: DC

## 2014-10-29 MED ORDER — PREDNISONE 10 MG PO TABS: ORAL_TABLET | ORAL | Status: DC

## 2014-10-29 NOTE — Telephone Encounter (Signed)
Her ins isn't covering the nasal spray, requires PA. She wants to know if there is something else you would recommend if PA doesn't get approved.

## 2014-10-29 NOTE — Telephone Encounter (Signed)
Pt needs to talk to someone about ins not covering her medication and would like something please call her at [phone]

## 2014-10-29 NOTE — Telephone Encounter (Signed)
I called and checked with pts pharmacy. Sumatriptan spray was approved by pts insurance & will be filled. I did tell pharmacist to cancel Rx for Zomig nasal spray.  Patient was notified of this.

## 2014-10-29 NOTE — Patient Instructions (Addendum)
1. Schedule MRI brain with and without contrast 2. MRI cervical spine without contrast 3. Increase amitriptyline 25mg : Take 1-1/2 tablets at dinner. Refrain from driving when you first increase the medication 4. Start Prednisone 10mg : Take 6 tablets on day 1, 5 tabs on day 2, 4 tabs on day 3, 3 tabs on day 4, 2 tabs on days 5 and 6, 1 tab on days 7 and 8 then stop 5. Use Zomig nasal spray: Spray in 1 nostril at onset of headache, may use second dose after 2 hours.  6. For any rescue medication such as Zomig or over the counter medication, do not take more than 2-3 a week to avoid rebound headaches

## 2014-10-29 NOTE — Telephone Encounter (Signed)
Pls let her know I sent Rx for sumatriptan nasal spray. If same issue with insurance, would do oral disintegrating tablet Maxalt 10mg , take 1 tab at onset of headache, may take second dose after 2 hours if needed. Rx #9 with 3 refills. Thanks

## 2014-10-29 NOTE — Progress Notes (Signed)
NEUROLOGY CONSULTATION NOTE  Ashley [name] MRN: [id number] DOB: 01-09-1966  Referring provider: Dr. Arnette Norris Primary care provider: Dr. Arnette Norris  Reason for consult:  headaches  Dear Dr Deborra Medina:  Thank you for your kind referral of Ashley [name] for consultation of the above symptoms. Although her history is well known to you, please allow me to reiterate it for the purpose of our medical record.Records and images were personally reviewed where available.  HISTORY OF PRESENT ILLNESS: This is a 48 year old right-handed woman with a history of prediabetes, insomnia, anxiety, back pain, presenting for worsening headaches. She reports that headaches started in her 48s. They are localized on the right hemisphere, usually starting behind her right eye with stabbing pain, radiating to the right hemisphere. The right side of her head would feel itchy, then her right eye burns and tears up. Headache goes from a 3 to 10 over 10, lasting 2-3 days, occurring around once a week. In the past she would go a year without headaches, however over the past 6 years, headaches have worsened. She has been having this current headache for a week, it has never lasted this long. She is also having numbness in her legs and hands, which is new as well. She has noticed memory difficulties this week, she has left keys in the car and in drive and has to remind herself. She has not gotten lost driving. In the past, headaches would be worse around the time of her menstrual period, she noticed brief improvement after hysterectomy, then headaches recurred.  There is associated nausea, vomiting, photo and phonophobia, some dizziness and blurred/double vision. Sometimes she sees a light coming down the right side for 2 seconds before the headaches occur.  She denies any triggers. She has been taking Motrin every 6 hours for the past week. In the past she would take Motrin and Phenergan. She has tried Imitrex PO with no effect. She has  been taking amitriptyline for the past year, increased to 25mg  last month but has not noticed much difference. Topamax had caused side effects. She reports sleep is good with Trazodone. She endorses a lot of stress for the past year. Her mother has migraines.  She denies any dysarthria, dysphagia, neck pain. She has chronic back pain and occasional constipation.   Laboratory Data: Lab Results  Component Value Date   WBC 7.3 06/03/2014   HGB 13.1 06/03/2014   HCT 39.3 06/03/2014   MCV 94.2 06/03/2014   PLT 379 06/03/2014     Chemistry      Component Value Date/Time   NA 143 06/03/2014 0954   K 4.8 06/03/2014 0954   CL 105 06/03/2014 0954   CO2 25 06/03/2014 0954   BUN 9 10/29/2014 1317   CREATININE 0.77 10/29/2014 1317   CREATININE 0.74 06/03/2014 0954      Component Value Date/Time   CALCIUM 9.8 06/03/2014 0954   ALKPHOS 72 04/30/2014 1140   AST 14 04/30/2014 1140   ALT 8 04/30/2014 1140   BILITOT 0.3 04/30/2014 1140     Lab Results  Component Value Date   TSH 1.02 01/28/2014   Lab Results  Component Value Date   HGBA1C 5.9 08/09/2014     PAST MEDICAL HISTORY: Past Medical History  Diagnosis Date  . Anxiety   . Tobacco abuse   . Cataract     bilateral  . SVD (spontaneous vaginal delivery)     x 3  . Bronchitis  uses inhaler if needed for bronchitis, lasted used -2014  . GERD (gastroesophageal reflux disease)   . History of blood transfusion 1966-12-17    at birth in [place], [place], unsure number of units  . Diabetes mellitus without complication     taken off Metformin since 04/2014, HgbA1C - normal, will follow up with PCP- Dr. Deborra Medina, 07/2014  . H/O exercise stress test 2011    done at [facility]- told that it was WNL, done due to pt. having panic attacks   . Chronic headaches     migraines - in past, uses Phenergan for nausea   . Poor dentition     very poor oral health     PAST SURGICAL HISTORY: Past Surgical History  Procedure Laterality Date  .  Appendectomy  1988  . Tubal ligation    . Esophagogastroduodenoscopy  12/28/2013  . Knee arthroscopy  1995    left  . Colonoscopy  02/16/2014    normal   . Laparoscopic assisted vaginal hysterectomy N/A 04/15/2014    Procedure: LAPAROSCOPIC ASSISTED VAGINAL HYSTERECTOMY;  Surgeon: Marylynn Pearson, MD;  Location: [facility];  Service: Gynecology;  Laterality: N/A;  . Laparoscopic bilateral salpingo oopherectomy Bilateral 04/15/2014    Procedure: LAPAROSCOPIC BILATERAL SALPINGO OOPHORECTOMY;  Surgeon: Marylynn Pearson, MD;  Location: [facility];  Service: Gynecology;  Laterality: Bilateral;  . Abdominal hysterectomy    . Cholecystectomy N/A 06/07/2014    Procedure: LAPAROSCOPIC CHOLECYSTECTOMY;  Surgeon: Ralene Ok, MD;  Location: [facility];  Service: General;  Laterality: N/A;  . Cholecystectomy  June 07 2014    MEDICATIONS: Current Outpatient Prescriptions on File Prior to Visit  Medication Sig Dispense Refill  . clonazePAM (KLONOPIN) 1 MG tablet Take 1 tablet (1 mg total) by mouth 2 (two) times daily. 60 tablet 0  . dicyclomine (BENTYL) 10 MG capsule Take 1 tab twice daily as needed for cramping and abdominal pain. 60 capsule 4  . metFORMIN (GLUCOPHAGE) 500 MG tablet Take 1 tablet (500 mg total) by mouth daily with breakfast. 90 tablet 3  . omeprazole (PRILOSEC) 40 MG capsule Take 40 mg by mouth daily before breakfast. Take 1 tab twice daily for one month then go to once daily    . polyethylene glycol powder (GLYCOLAX/MIRALAX) powder Take 17 g by mouth daily. Until daily soft stools  OTC    . promethazine (PHENERGAN) 25 MG tablet Take 1 tab by mouth every 6 hours as needed for nausea 90 tablet 3  . traZODone (DESYREL) 150 MG tablet Take 1 tablet (150 mg total) by mouth at bedtime. 90 tablet 0   No current facility-administered medications on file prior to visit.    ALLERGIES: Allergies  Allergen Reactions  . Penicillins Anaphylaxis    REACTION: Throat swells  . Topamax [Topiramate]      Hands and feet to go numb    FAMILY HISTORY: Family History  Problem Relation Age of Onset  . Diabetes Mother   . Hyperlipidemia Mother   . Hypertension Mother   . Colon cancer Neg Hx   . Rectal cancer Neg Hx   . Stomach cancer Neg Hx   . Prostate cancer Father   . Lung cancer Maternal Grandfather     SOCIAL HISTORY: History   Social History  . Marital Status: Married    Spouse Name: N/A    Number of Children: 6  . Years of Education: N/A   Occupational History  .       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 Smoking status: Former Smoker -- 1.00 packs/day for 35 years    Types: Cigarettes, E-cigarettes    Quit date: 02/02/2014  . Smokeless tobacco: Never Used  . Alcohol Use: No  . Drug Use: No  . Sexual Activity: Yes    Birth Control/ Protection: Pill   Other Topics Concern  . Not on file   Social History Narrative   5 living children, one child is a crack cocaine addict who often breaks into their house to steal money. One child died of cerebral palsy.    REVIEW OF SYSTEMS: Constitutional: No fevers, chills, or sweats, no generalized fatigue, change in appetite Eyes: No visual changes, double vision, eye pain Ear, nose and throat: No hearing loss, ear pain, nasal congestion, sore throat Cardiovascular: No chest pain, palpitations Respiratory:  No shortness of breath at rest or with exertion, wheezes GastrointestinaI: No nausea, vomiting, diarrhea, abdominal pain, fecal incontinence Genitourinary:  No dysuria, urinary retention or frequency +Musculoskeletal:  No neck pain, +back pain Integumentary: No rash, pruritus, skin lesions Neurological: as above Psychiatric: No depression, insomnia, anxiety Endocrine: No palpitations, fatigue, diaphoresis, mood swings, change in appetite, change in weight, increased thirst Hematologic/Lymphatic:  No anemia, purpura, petechiae. Allergic/Immunologic: no itchy/runny eyes, nasal congestion, recent  allergic reactions, rashes  PHYSICAL EXAM: Filed Vitals:   10/29/14 0941  BP: 120/70  Pulse: 94  Resp: 16   General: No acute distress Head:  Normocephalic/atraumatic Eyes: Fundoscopic exam shows bilateral sharp discs, no vessel changes, exudates, or hemorrhages Neck: supple, no paraspinal tenderness, full range of motion Back: No paraspinal tenderness Heart: regular rate and rhythm Lungs: Clear to auscultation bilaterally. Vascular: No carotid bruits. Skin/Extremities: No rash, no edema Neurological Exam: Mental status: alert and oriented to person, place, and time, no dysarthria or aphasia, Fund of knowledge is appropriate.  Recent and remote memory are intact. 3/3 delayed recall.  Attention and concentration are normal.    Able to name objects and repeat phrases. Cranial nerves: CN I: not tested CN II: pupils equal, round and reactive to light, visual fields intact, fundi unremarkable. CN III, IV, VI:  full range of motion, no nystagmus, no ptosis CN V: decreased pin and temperature on right V1-3, split midline with pin and tuning fork CN VII: upper and lower face symmetric CN VIII: hearing intact to finger rub CN IX, X: gag intact, uvula midline CN XI: sternocleidomastoid and trapezius muscles intact CN XII: tongue midline Bulk & Tone: normal, no fasciculations. Motor: 5/5 throughout with no pronator drift. Sensation: decreased pin and temperature on right UE and LE, intact vibration and joint position sense.  No extinction to double simultaneous stimulation.  Romberg test negative Deep Tendon Reflexes: +3 brisk on both UE with bilateral Hoffman sign.  Brisk +3 bilateral patella, +2 bilateral ankle jerks, no ankle clonus Plantar responses: mute on left, downgoing on right Cerebellar: no incoordination on finger to nose, heel to shin. No dysdiadochokinesia Gait: narrow-based and steady, able to tandem walk adequately. Tremor: none  IMPRESSION: This is a 48 year old  right-handed woman with a history of prediabetes, anxiety, back pain, and headaches, presenting with worsening headaches, now with memory changes and paresthesias. Prior headaches suggestive of possible cluster headaches. Her exam shows subjective decreased sensation over the right side and brisk reflexes with Hoffman sign, concerning for myelopathy. MRI brain and cervical spine will be ordered. She will take a course of prednisone to hopefully break this headache cycle,  she will continue to monitor blood sugar during the week course of steroids. She will increase dose of amitriptyline to 25mg  1-1/2 tablet at bedtime. She may benefit from Verapamil for headache prophylaxis in the future. She was advised to minimize rescue medication to 2-3 a week to avoid rebound headaches and will try Zomig nasal spray for rescue. Side effects were discussed. She will follow-up in 2 months.  Thank you for allowing me to participate in the care of this patient. Please do not hesitate to call for any questions or concerns.   Ellouise Newer, M.D.  CC: Dr. Deborra Medina

## 2014-10-30 ENCOUNTER — Encounter: Payer: Self-pay | Admitting: Neurology

## 2014-10-30 LAB — BUN: BUN: 9 mg/dL (ref 6–23)

## 2014-10-30 LAB — CREATININE, SERUM: CREATININE: 0.77 mg/dL (ref 0.50–1.10)

## 2014-11-03 ENCOUNTER — Other Ambulatory Visit: Payer: Self-pay | Admitting: *Deleted

## 2014-11-03 MED ORDER — TRAZODONE HCL 150 MG PO TABS: 150.0000 mg | ORAL_TABLET | Freq: Every day | ORAL | Status: DC

## 2014-11-04 ENCOUNTER — Other Ambulatory Visit: Payer: Self-pay | Admitting: Family Medicine

## 2014-11-05 ENCOUNTER — Other Ambulatory Visit: Payer: Self-pay

## 2014-11-05 ENCOUNTER — Other Ambulatory Visit: Payer: Self-pay | Admitting: Family Medicine

## 2014-11-05 ENCOUNTER — Other Ambulatory Visit: Payer: Self-pay | Admitting: Internal Medicine

## 2014-11-05 DIAGNOSIS — R739 Hyperglycemia, unspecified: Secondary | ICD-10-CM

## 2014-11-05 MED ORDER — TRAZODONE HCL 150 MG PO TABS: 150.0000 mg | ORAL_TABLET | Freq: Every day | ORAL | Status: DC

## 2014-11-05 NOTE — Telephone Encounter (Signed)
Pt left v/m requesting clonazepam called to [facility] [place] [facility].Please advise.

## 2014-11-05 NOTE — Telephone Encounter (Signed)
rx was cancelled at [facility] and [facility]

## 2014-11-05 NOTE — Telephone Encounter (Signed)
Pt left v/m requesting tra[facility]odone [facility].Please advise.

## 2014-11-05 NOTE — Telephone Encounter (Signed)
Will defer to Dr. Deborra Medina. Her anxiety has not really been addressed since 10/2013. Not sure if you want her to follow up or go ahead and refill.

## 2014-11-05 NOTE — Telephone Encounter (Signed)
This looks like it was done 2 days ago by Dr. Deborra Medina

## 2014-11-08 MED ORDER — CLONAZEPAM 1 MG PO TABS: 1.0000 mg | ORAL_TABLET | Freq: Two times a day (BID) | ORAL | Status: DC

## 2014-11-08 NOTE — Telephone Encounter (Signed)
Ok to refill as requested.  Please schedule her a 30 minute follow up anxiety appt in the next month or two.

## 2014-11-08 NOTE — Telephone Encounter (Signed)
Pt left v/m requesting status of clonazepam refill; pt request cb.

## 2014-11-08 NOTE — Telephone Encounter (Signed)
Spoke to pt and informed her Rx called in to requested pharmacy; f/u appt scheduled.

## 2014-11-10 ENCOUNTER — Ambulatory Visit (HOSPITAL_COMMUNITY)
Admission: RE | Admit: 2014-11-10 | Discharge: 2014-11-10 | Disposition: A | Discharge status: Home / Self Care | Payer: No Typology Code available for payment source | Source: Ambulatory Visit | Attending: Neurology | Admitting: Neurology

## 2014-11-10 DIAGNOSIS — M4802 Spinal stenosis, cervical region: Secondary | ICD-10-CM | POA: Insufficient documentation

## 2014-11-10 DIAGNOSIS — M542 Cervicalgia: Secondary | ICD-10-CM | POA: Diagnosis present

## 2014-11-10 DIAGNOSIS — R200 Anesthesia of skin: Secondary | ICD-10-CM | POA: Insufficient documentation

## 2014-11-10 MED ORDER — GADOBENATE DIMEGLUMINE 529 MG/ML IV SOLN
15.0000 mL | Freq: Once | INTRAVENOUS | Status: AC | PRN
  Administered 2014-11-10: 13 mL via INTRAVENOUS

## 2014-11-11 ENCOUNTER — Other Ambulatory Visit (INDEPENDENT_AMBULATORY_CARE_PROVIDER_SITE_OTHER): Payer: No Typology Code available for payment source

## 2014-11-11 DIAGNOSIS — R739 Hyperglycemia, unspecified: Secondary | ICD-10-CM

## 2014-11-11 LAB — COMPREHENSIVE METABOLIC PANEL
ALT: 24 U/L (ref 0–35)
AST: 18 U/L (ref 0–37)
Albumin: 3.8 g/dL (ref 3.5–5.2)
Alkaline Phosphatase: 98 U/L (ref 39–117)
BUN: 9 mg/dL (ref 6–23)
CALCIUM: 9 mg/dL (ref 8.4–10.5)
CO2: 27 meq/L (ref 19–32)
CREATININE: 0.7 mg/dL (ref 0.4–1.2)
Chloride: 101 mEq/L (ref 96–112)
GFR: 96.18 mL/min (ref >60.00)
Glucose, Bld: 136 mg/dL — ABNORMAL HIGH (ref 70–99)
Potassium: 3.6 mEq/L (ref 3.5–5.1)
Sodium: 138 mEq/L (ref 135–145)
Total Bilirubin: 0.6 mg/dL (ref 0.2–1.2)
Total Protein: 7.1 g/dL (ref 6.0–8.3)

## 2014-11-11 LAB — HEMOGLOBIN A1C: HEMOGLOBIN A1C: 5.9 % (ref 4.6–6.5)

## 2014-11-11 LAB — LIPID PANEL
CHOLESTEROL: 185 mg/dL (ref 0–200)
HDL: 48.7 mg/dL (ref >39.00)
LDL Cholesterol: 110 mg/dL — ABNORMAL HIGH (ref 0–99)
NonHDL: 136.3
TRIGLYCERIDES: 133 mg/dL (ref 0.0–149.0)
Total CHOL/HDL Ratio: 4
VLDL: 26.6 mg/dL (ref 0.0–40.0)

## 2014-11-15 ENCOUNTER — Ambulatory Visit (INDEPENDENT_AMBULATORY_CARE_PROVIDER_SITE_OTHER): Payer: No Typology Code available for payment source | Admitting: Family Medicine

## 2014-11-15 ENCOUNTER — Encounter: Payer: Self-pay | Admitting: Family Medicine

## 2014-11-15 VITALS — BP 124/72 | HR 88 | Temp 97.5°F | Wt 144.5 lb

## 2014-11-15 DIAGNOSIS — G43819 Other migraine, intractable, without status migrainosus: Secondary | ICD-10-CM

## 2014-11-15 DIAGNOSIS — F411 Generalized anxiety disorder: Secondary | ICD-10-CM

## 2014-11-15 DIAGNOSIS — G43101 Migraine with aura, not intractable, with status migrainosus: Secondary | ICD-10-CM

## 2014-11-15 MED ORDER — CLONAZEPAM 1 MG PO TABS: 1.0000 mg | ORAL_TABLET | Freq: Three times a day (TID) | ORAL | Status: DC | PRN

## 2014-11-15 NOTE — Patient Instructions (Signed)
It was great to see you. Hang in there and try to have a Happy Thanksgiving.  Please try not to take clonazepam more than twice a day.  Please set up Physical Therapy.

## 2014-11-15 NOTE — Assessment & Plan Note (Signed)
Persistent.  See below. Followed by neuro.

## 2014-11-15 NOTE — Progress Notes (Signed)
Pre visit review using our clinic review tool, if applicable. No additional management support is needed unless otherwise documented below in the visit note. 

## 2014-11-15 NOTE — Progress Notes (Signed)
Subjective:   Patient ID: Ashley [name], female    DOB: 07/21/1966, 48 y.o.   MRN: [id number]  Ashley [name] is a pleasant 48 y.o. year old female who presents to clinic today with Follow-up and Medication Refill  on 11/15/2014  HPI:  When I saw her in October, her migraines had worsened and was having memory issues, right sided radiculopathy and other worrisome symptoms so I referred her to neurology.  Saw Dr. Delice Lesch on 10/29/2014- note reviewed- her assessment was:  This is a 48 year old right-handed woman with a history of prediabetes, anxiety, back pain, and headaches, presenting with worsening headaches, now with memory changes and paresthesias. Prior headaches suggestive of possible cluster headaches. Her exam shows subjective decreased sensation over the right side and brisk reflexes with Hoffman sign, concerning for myelopathy. MRI brain and cervical spine will be ordered. She will take a course of prednisone to hopefully break this headache cycle, she will continue to monitor blood sugar during the week course of steroids. She will increase dose of amitriptyline to 25mg  1-1/2 tablet at bedtime. She may benefit from Verapamil for headache prophylaxis in the future. She was advised to minimize rescue medication to 2-3 a week to avoid rebound headaches and will try Zomig nasal spray for rescue. Side effects were discussed. She will follow-up in 2 months.  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  Dr. Charlotte Crumb recommended PT.  She has not set up PT yet.  She had to get Imitrex nasal spray because insurance company would not cover Zomig nasal spray.   Still has a constant headache.  She feels her anxiety is what is causing her headaches- under significant stress.  Her son was just sentenced to 62 years in prison for drug related offenses, has 5 children living with her.  Current Outpatient Prescriptions on File Prior to Visit  Medication Sig Dispense Refill  .  amitriptyline (ELAVIL) 25 MG tablet Take 1-1/2 tablet at dinner time 45 tablet 3  . dicyclomine (BENTYL) 10 MG capsule Take 1 tab twice daily as needed for cramping and abdominal pain. 60 capsule 4  . ibuprofen (ADVIL,MOTRIN) 200 MG tablet Take 200 mg by mouth. Takes 3 tablets every 6 hours prn    . metFORMIN (GLUCOPHAGE) 500 MG tablet Take 1 tablet (500 mg total) by mouth daily with breakfast. 90 tablet 3  . omeprazole (PRILOSEC) 40 MG capsule Take 40 mg by mouth daily before breakfast. Take 1 tab twice daily for one month then go to once daily    . polyethylene glycol powder (GLYCOLAX/MIRALAX) powder Take 17 g by mouth daily. Until daily soft stools  OTC    . promethazine (PHENERGAN) 25 MG tablet Take 1 tab by mouth every 6 hours as needed for nausea 90 tablet 3  . SUMAtriptan (IMITREX) 5 MG/ACT nasal spray Spray in 1 nostril at onset of headache, may use second dose after 2 hours if needed. Do not use more than 3 times a week 6 Inhaler 3  . traZODone (DESYREL) 150 MG tablet Take 1 tablet (150 mg total) by mouth at bedtime. 90 tablet 0   No current facility-administered medications on file prior to visit.    Allergies  Allergen Reactions  . Penicillins Anaphylaxis    REACTION: Throat swells  . Topamax [Topiramate]     Hands and feet to go numb    Past Medical History  Diagnosis Date  . Anxiety   .  Tobacco abuse   . Cataract     bilateral  . SVD (spontaneous vaginal delivery)     x 3  . Bronchitis     uses inhaler if needed for bronchitis, lasted used -2014  . GERD (gastroesophageal reflux disease)   . History of blood transfusion 09/03/66    at birth in [place], [place], unsure number of units  . Diabetes mellitus without complication     taken off Metformin since 04/2014, HgbA1C - normal, will follow up with PCP- Dr. Deborra Medina, 07/2014  . H/O exercise stress test 2011    done at [facility]- told that it was WNL, done due to pt. having panic attacks   . Chronic headaches     migraines  - in past, uses Phenergan for nausea   . Poor dentition     very poor oral health     Past Surgical History  Procedure Laterality Date  . Appendectomy  1988  . Tubal ligation    . Esophagogastroduodenoscopy  12/28/2013  . Knee arthroscopy  1995    left  . Colonoscopy  02/16/2014    normal   . Laparoscopic assisted vaginal hysterectomy N/A 04/15/2014    Procedure: LAPAROSCOPIC ASSISTED VAGINAL HYSTERECTOMY;  Surgeon: Marylynn Pearson, MD;  Location: [facility];  Service: Gynecology;  Laterality: N/A;  . Laparoscopic bilateral salpingo oopherectomy Bilateral 04/15/2014    Procedure: LAPAROSCOPIC BILATERAL SALPINGO OOPHORECTOMY;  Surgeon: Marylynn Pearson, MD;  Location: [facility];  Service: Gynecology;  Laterality: Bilateral;  . Abdominal hysterectomy    . Cholecystectomy N/A 06/07/2014    Procedure: LAPAROSCOPIC CHOLECYSTECTOMY;  Surgeon: Ralene Ok, MD;  Location: [facility];  Service: General;  Laterality: N/A;  . Cholecystectomy  June 07 2014    Family History  Problem Relation Age of Onset  . Diabetes Mother   . Hyperlipidemia Mother   . Hypertension Mother   . Colon cancer Neg Hx   . Rectal cancer Neg Hx   . Stomach cancer Neg Hx   . Prostate cancer Father   . Lung cancer Maternal Grandfather     History   Social History  . Marital Status: Married    Spouse Name: N/A    Number of Children: 6  . Years of Education: N/A   Occupational History  .       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 Smoking status: Former Smoker -- 1.00 packs/day for 35 years    Types: Cigarettes, E-cigarettes    Quit date: 02/02/2014  . Smokeless tobacco: Never Used  . Alcohol Use: No  . Drug Use: No  . Sexual Activity: Yes    Birth Control/ Protection: Pill   Other Topics Concern  . Not on file   Social History Narrative   5 living children, one child is a crack cocaine addict who often breaks into their house to steal money. One child died of cerebral palsy.   The  PMH, PSH, Social History, Family History, Medications, and allergies have been reviewed in [facility], and have been updated if relevant.   Review of Systems    See HPI No blurred vision No dysarthria No SI or HI No tremors  Objective:    BP 124/72 mmHg  Pulse 88  Temp(Src) 97.5 F (36.4 C) (Oral)  Wt 144 lb 8 oz (65.545 kg)  SpO2 98%  LMP 02/12/2014   Physical Exam  Gen:  Alert, pleasant, NAD Psych:  Good eye contact.  Does seem  more anxious today     Assessment & Plan:   Anxiety state  Other type of intractable migraine No Follow-up on file.

## 2014-11-15 NOTE — Assessment & Plan Note (Signed)
>  25 minutes spent in face to face time with patient, >50% spent in counselling or coordination of care Deteriorated due to acute stressors. Discussed tx options- she feels increased elavil has not helped with her symptoms. She is asking to increase her dose of Klonopin.  I did explain to her that her body is developing a tolerance and we should consider other alternatives.  Given her worsening migraines, we agreed to address her anxiety at a later date as elavil is being titrated up and SSRIs can worsen migraines.  Will temporarily increase dose of prn klonopin but I advised her to use sparingly. The patient indicates understanding of these issues and agrees with the plan.

## 2014-12-01 ENCOUNTER — Other Ambulatory Visit: Payer: Self-pay | Admitting: Neurology

## 2014-12-01 NOTE — Telephone Encounter (Signed)
I spoke with her. She states that the nasal spray is on back order, she has checked several pharmacies. She states the pills don't work. Wants to know if there is something else you could recommend.

## 2014-12-01 NOTE — Telephone Encounter (Signed)
Pt called requesting a refill for SUMATRIPTAN NASAL SPRAY 5MG  Pharmacy: [facility] [place] C/b [phone]

## 2014-12-01 NOTE — Telephone Encounter (Signed)
Try Cambia 50mg , they are powder form, she will dissolve in glass of water. Rx in meds, not sure which pharm to send, pls send to her pharm of choice. Thanks

## 2014-12-02 MED ORDER — CAMBIA 50 MG PO PACK: PACK | ORAL | Status: DC

## 2014-12-29 ENCOUNTER — Ambulatory Visit: Payer: No Typology Code available for payment source | Admitting: Neurology

## 2014-12-29 ENCOUNTER — Telehealth: Payer: Self-pay | Admitting: Neurology

## 2014-12-29 NOTE — Telephone Encounter (Signed)
appt marked as a no show b/c pt did not provide 24hrs prior notice but a no show letter will not be sent / Sherri S.

## 2014-12-29 NOTE — Telephone Encounter (Signed)
Pt called and left message with the answering service at 8:24 pm stating she will not be able to make appt due to flu like symptoms . She will call back to resch appt

## 2015-01-04 ENCOUNTER — Ambulatory Visit (INDEPENDENT_AMBULATORY_CARE_PROVIDER_SITE_OTHER): Payer: No Typology Code available for payment source | Admitting: Neurology

## 2015-01-04 ENCOUNTER — Telehealth: Payer: Self-pay | Admitting: Neurology

## 2015-01-04 ENCOUNTER — Encounter: Payer: Self-pay | Admitting: Neurology

## 2015-01-04 VITALS — BP 110/82 | HR 78 | Resp 16 | Ht 62.0 in | Wt 144.0 lb

## 2015-01-04 DIAGNOSIS — R519 Headache, unspecified: Secondary | ICD-10-CM

## 2015-01-04 DIAGNOSIS — R51 Headache: Secondary | ICD-10-CM

## 2015-01-04 DIAGNOSIS — G44011 Episodic cluster headache, intractable: Secondary | ICD-10-CM

## 2015-01-04 MED ORDER — VERAPAMIL HCL 40 MG PO TABS: ORAL_TABLET | ORAL | Status: DC

## 2015-01-04 MED ORDER — AMITRIPTYLINE HCL 25 MG PO TABS: ORAL_TABLET | ORAL | Status: DC

## 2015-01-04 MED ORDER — SUMATRIPTAN SUCCINATE 6 MG/0.5ML ~~LOC~~ SOLN: SUBCUTANEOUS | Status: DC

## 2015-01-04 MED ORDER — ZOLMITRIPTAN 5 MG NA SOLN: NASAL | Status: DC

## 2015-01-04 NOTE — Telephone Encounter (Signed)
Pt states that the pharmacy did not get the imitrex  Injection  Please resend it to the drug store [facility]  Pt  phone [phone]

## 2015-01-04 NOTE — Progress Notes (Signed)
NEUROLOGY FOLLOW UP OFFICE NOTE  Ashley [name] [id number]  HISTORY OF PRESENT ILLNESS: I had the pleasure of seeing Ashley [name] in follow-up in the neurology clinic on 01/04/2015.  The patient was last seen 2 months ago for worsening headaches with memory changes and paresthesias. Exam had shown subjective decreased sensation on the right, and brisk reflexes with Hoffman sign on the right. Records and images were personally reviewed where available.  I personally reviewed MRI brain and cervical spine, brain normal, no evidence of cervical myelopathy. 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   Since her last visit, dose of amitriptyline was increased slightly by 1/2 tablet. She feels that this did reduce the intensity of the headaches, but still continued to have daily low grade headaches that would go to a 10/10 pain around 3 times a week. She has been taking Ibuprofen 1-2 times daily and has needed to take her husband's Prilosec. She reports being in bed more than out of bed, with nausea, tearing of the right eye. She took a course of steroids on her last visit which helped a little. She has been unable to obtain triptan nasal spray due to insurance issues. She denies any dizziness, diplopia, focal numbness/tingling/weakness.  HPI: This is a 49 yo RH woman with a history of prediabetes, insomnia, anxiety, back pain, who presented with headaches suggestive of cluster headaches. She reports that headaches started in her 43s. They are localized on the right hemisphere, usually starting behind her right eye with stabbing pain, radiating to the right hemisphere. The right side of her head would feel itchy, then her right eye burns and tears up. Headache goes from a 3 to 10 over 10, lasting 2-3 days, occurring around once a week. In the past she would go a year without headaches, however over the past 6 years, headaches have  worsened. She is also having numbness in her legs and hands, which is new as well. She has noticed memory difficulties, she has left keys in the car and in drive and has to remind herself. She has not gotten lost driving. In the past, headaches would be worse around the time of her menstrual period, she noticed brief improvement after hysterectomy, then headaches recurred. There is associated nausea, vomiting, photo and phonophobia, some dizziness and blurred/double vision. Sometimes she sees a light coming down the right side for 2 seconds before the headaches occur. She denies any triggers. In the past she would take Motrin and Phenergan. She has tried Imitrex PO with no effect. She has been taking amitriptyline,Topamax had caused side effects. She reports sleep is good with Trazodone. She endorses a lot of stress for the past year. Her mother has migraines.  PAST MEDICAL HISTORY: Past Medical History  Diagnosis Date  . Anxiety   . Tobacco abuse   . Cataract     bilateral  . SVD (spontaneous vaginal delivery)     x 3  . Bronchitis     uses inhaler if needed for bronchitis, lasted used -2014  . GERD (gastroesophageal reflux disease)   . History of blood transfusion 03/22/1966    at birth in [place], [place], unsure number of units  . Diabetes mellitus without complication     taken off Metformin since 04/2014, HgbA1C - normal, will follow up with PCP- Dr. Deborra Medina, 07/2014  . H/O exercise stress test 2011    done at [facility]  [place]- told that it was WNL, done due to pt. having panic attacks   . Chronic headaches     migraines - in past, uses Phenergan for nausea   . Poor dentition     very poor oral health     MEDICATIONS: Current Outpatient Prescriptions on File Prior to Visit  Medication Sig Dispense Refill  . clonazePAM (KLONOPIN) 1 MG tablet Take 1 tablet (1 mg total) by mouth 3 (three) times daily as needed for anxiety. 90 tablet 0  . dicyclomine (BENTYL) 10 MG capsule Take 1 tab twice daily as  needed for cramping and abdominal pain. 60 capsule 4  . ibuprofen (ADVIL,MOTRIN) 200 MG tablet Take 200 mg by mouth. Takes 3 tablets every 6 hours prn    . metFORMIN (GLUCOPHAGE) 500 MG tablet Take 1 tablet (500 mg total) by mouth daily with breakfast. 90 tablet 3  . omeprazole (PRILOSEC) 40 MG capsule Take 40 mg by mouth daily before breakfast. Take 1 tab twice daily for one month then go to once daily    . polyethylene glycol powder (GLYCOLAX/MIRALAX) powder Take 17 g by mouth daily. Until daily soft stools  OTC    . promethazine (PHENERGAN) 25 MG tablet Take 1 tab by mouth every 6 hours as needed for nausea 90 tablet 3  . traZODone (DESYREL) 150 MG tablet Take 1 tablet (150 mg total) by mouth at bedtime. 90 tablet 0  . SUMAtriptan (IMITREX) 5 MG/ACT nasal spray Spray in 1 nostril at onset of headache, may use second dose after 2 hours if needed. Do not use more than 3 times a week (Patient not taking: Reported on 01/04/2015) 6 Inhaler 3   No current facility-administered medications on file prior to visit.    ALLERGIES: Allergies  Allergen Reactions  . Penicillins Anaphylaxis    REACTION: Throat swells  . Topamax [Topiramate]     Hands and feet to go numb    FAMILY HISTORY: Family History  Problem Relation Age of Onset  . Diabetes Mother   . Hyperlipidemia Mother   . Hypertension Mother   . Colon cancer Neg Hx   . Rectal cancer Neg Hx   . Stomach cancer Neg Hx   . Prostate cancer Father   . Lung cancer Maternal Grandfather     SOCIAL HISTORY: History   Social History  . Marital Status: Married    Spouse Name: N/A    Number of Children: 6  . Years of Education: N/A   Occupational History  .       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 Smoking status: Former Smoker -- 1.00 packs/day for 35 years    Types: Cigarettes, E-cigarettes    Quit date: 02/02/2014  . Smokeless tobacco: Never Used  . Alcohol Use: No  . Drug Use: No  . Sexual  Activity: Yes    Birth Control/ Protection: Pill   Other Topics Concern  . Not on file   Social History Narrative   5 living children, one child is a crack cocaine addict who often breaks into their house to steal money. One child died of cerebral palsy.    REVIEW OF SYSTEMS: Constitutional: No fevers, chills, or sweats, no generalized fatigue, change in appetite Eyes: No visual changes, double vision, eye pain Ear, nose and throat: No hearing loss, ear pain, nasal congestion, sore throat Cardiovascular: No chest pain, palpitations Respiratory:  No shortness of breath at rest or with exertion,  wheezes GastrointestinaI: No nausea, vomiting, diarrhea, abdominal pain, fecal incontinence Genitourinary:  No dysuria, urinary retention or frequency Musculoskeletal:  No neck pain, +back pain Integumentary: No rash, pruritus, skin lesions Neurological: as above Psychiatric: No depression,+ insomnia, anxiety Endocrine: No palpitations, fatigue, diaphoresis, mood swings, change in appetite, change in weight, increased thirst Hematologic/Lymphatic:  No anemia, purpura, petechiae. Allergic/Immunologic: no itchy/runny eyes, nasal congestion, recent allergic reactions, rashes  PHYSICAL EXAM: Filed Vitals:   01/04/15 1031  BP: 110/82  Pulse: 78  Resp: 16   General: No acute distress Head:  Normocephalic/atraumatic Neck: supple, no paraspinal tenderness, full range of motion Heart:  Regular rate and rhythm Lungs:  Clear to auscultation bilaterally Back: No paraspinal tenderness Skin/Extremities: No rash, no edema Neurological Exam: alert and oriented to person, place, and time, no dysarthria or aphasia, Fund of knowledge is appropriate. Recent and remote memory are intact. Attention and concentration are normal. Able to name objects and repeat phrases. Cranial nerves: CN I: not tested CN II: pupils equal, round and reactive to light, visual fields intact, fundi unremarkable. CN III,  IV, VI: full range of motion, no nystagmus, no ptosis CN V: intact to light touch CN VII: upper and lower face symmetric CN VIII: hearing intact to finger rub CN IX, X: gag intact, uvula midline CN XI: sternocleidomastoid and trapezius muscles intact CN XII: tongue midline Bulk & Tone: normal, no fasciculations. Motor: 5/5 throughout with no pronator drift. Sensation: intact to light touch. Romberg test negative Deep Tendon Reflexes: +3 brisk on both UE with bilateral Hoffman sign. Brisk +3 bilateral patella, +2 bilateral ankle jerks, no ankle clonus (similar to prior) Plantar responses: mute on left, downgoing on right Cerebellar: no incoordination on finger to nose testing Gait: narrow-based and steady, able to tandem walk adequately. Tremor: none.  IMPRESSION: This is a 49 yo RH woman with a history of prediabetes, anxiety, back pain, and headaches, who presented with worsening headaches, memory changes and paresthesias. Prior headaches suggestive of possible cluster headaches. MRI brain normal. Exam had shown brisk reflexes, MRI C-spine unremarkable. She continues to have daily headaches with some relief with amitriptyline but some drowsiness. We discussed treatment options for headache prophylaxis, she would like to add on another medication, and will start low dose Verapamil 40mg  daily with plans for uptitration as tolerated. Continue current dose of amitriptyline for now. She will try Imitrex [place] for severe headaches. We again discussed minimizing rescue medication to 2-3 a week to avoid rebound headaches. She will keep a headache diary and follow-up in 2-3 months.  Thank you for allowing me to participate in her care.  Please do not hesitate to call for any questions or concerns.  The duration of this appointment visit was 25 minutes of face-to-face time with the patient.  Greater than 50% of this time was spent in counseling, explanation of diagnosis, planning of further management,  and coordination of care.   Ellouise Newer, M.D.   CC: Dr. Deborra Medina

## 2015-01-04 NOTE — Telephone Encounter (Signed)
Rx was printed. Will fax to pharmacy.

## 2015-01-04 NOTE — Patient Instructions (Signed)
1. Start Verapamil 40mg : Take 1 capsule at bedtime daily 2. Continue amitritpyline 3. Take Imitrex injection at onset of headache, may use second dose after 2 hours. Do not use more than 3 times a week 4. Minimize intake of Ibuprofen, Tylenol, or any other over the counter medication to 2-3 times a week to avoid rebound headaches 5. Keep a headache diary 6. Follow-up in 2-3 months

## 2015-01-05 ENCOUNTER — Telehealth: Payer: Self-pay | Admitting: Neurology

## 2015-01-05 MED ORDER — ELETRIPTAN HYDROBROMIDE 40 MG PO TABS: ORAL_TABLET | ORAL | Status: DC

## 2015-01-05 NOTE — Telephone Encounter (Signed)
Pt called wanting to speak to a nurse regarding his meds not being covered and to see if she can be switched to something else. cb [phone]

## 2015-01-05 NOTE — Telephone Encounter (Signed)
Called lmom that Relpax was sent to pharmacy.

## 2015-01-05 NOTE — Telephone Encounter (Signed)
Would try either Relpax or Maxalt. Can she call her pharmacy and check prices and which is most cost-effective for her? Thanks

## 2015-01-05 NOTE — Telephone Encounter (Signed)
Try Relpax 40mg  take 1 tab at onset of HA, may take second dose after 2 hours. Do not take more than 3 a week. #10 with 3 refills. THanks

## 2015-01-05 NOTE — Telephone Encounter (Signed)
We wouldn't be able to get pricing unless they ran Rx first.

## 2015-01-05 NOTE — Telephone Encounter (Signed)
Spoke with her imitrex inj is too expensive for her. She would like to try the pills again since she's having issues with getting other meds covered.

## 2015-01-11 ENCOUNTER — Encounter: Payer: Self-pay | Admitting: Neurology

## 2015-01-18 ENCOUNTER — Other Ambulatory Visit: Payer: Self-pay | Admitting: Family Medicine

## 2015-01-18 MED ORDER — CLONAZEPAM 1 MG PO TABS: 1.0000 mg | ORAL_TABLET | Freq: Three times a day (TID) | ORAL | Status: DC | PRN

## 2015-01-18 NOTE — Telephone Encounter (Signed)
Pt request refill klonopin to [facility]. Please advise. Pt last seen 11/15/14.

## 2015-01-19 NOTE — Telephone Encounter (Signed)
Rx called in to requested pharmacy 

## 2015-01-24 MED ORDER — RIZATRIPTAN BENZOATE 10 MG PO TBDP: 10.0000 mg | ORAL_TABLET | ORAL | Status: DC | PRN

## 2015-02-03 ENCOUNTER — Other Ambulatory Visit: Payer: Self-pay | Admitting: Family Medicine

## 2015-02-03 ENCOUNTER — Other Ambulatory Visit: Payer: Self-pay

## 2015-02-03 NOTE — Telephone Encounter (Signed)
Pt left /vm requesting refill dicyclomine to [facility].Please advise.

## 2015-02-04 MED ORDER — DICYCLOMINE HCL 10 MG PO CAPS: ORAL_CAPSULE | ORAL | Status: DC

## 2015-02-24 ENCOUNTER — Other Ambulatory Visit: Payer: Self-pay

## 2015-02-24 MED ORDER — CLONAZEPAM 1 MG PO TABS: 1.0000 mg | ORAL_TABLET | Freq: Three times a day (TID) | ORAL | Status: DC | PRN

## 2015-02-24 NOTE — Telephone Encounter (Signed)
Rx called in to requested pharmacy 

## 2015-02-24 NOTE — Telephone Encounter (Signed)
Pt left v/m requesting refill clonazepam to [facility]. Please advise.pt last seen 11/15/14.

## 2015-03-14 ENCOUNTER — Ambulatory Visit (INDEPENDENT_AMBULATORY_CARE_PROVIDER_SITE_OTHER): Payer: No Typology Code available for payment source | Admitting: Family Medicine

## 2015-03-14 ENCOUNTER — Encounter: Payer: Self-pay | Admitting: Family Medicine

## 2015-03-14 VITALS — BP 130/80 | HR 84 | Temp 98.2°F | Ht 62.0 in | Wt 148.0 lb

## 2015-03-14 DIAGNOSIS — M5440 Lumbago with sciatica, unspecified side: Secondary | ICD-10-CM

## 2015-03-14 MED ORDER — MELOXICAM 15 MG PO TABS: 15.0000 mg | ORAL_TABLET | Freq: Every day | ORAL | Status: DC

## 2015-03-14 MED ORDER — CYCLOBENZAPRINE HCL 10 MG PO TABS: 10.0000 mg | ORAL_TABLET | Freq: Three times a day (TID) | ORAL | Status: DC | PRN

## 2015-03-14 NOTE — Assessment & Plan Note (Signed)
Started last thrusday after sleeping on a bad mattress and not improving  Reviewed xray from fall-nl  Suspect acute spasm causing sciatic nerve pain  tx with meloxicam (inst of aleve) , and flexeril prn with caution  Disc use of heat Ref to PT - for eval and tx

## 2015-03-14 NOTE — Progress Notes (Signed)
Pre visit review using our clinic review tool, if applicable. No additional management support is needed unless otherwise documented below in the visit note. 

## 2015-03-14 NOTE — Patient Instructions (Signed)
Take meloxicam once daily instead aleve Take it with food  Flexeril - muscle relaxer/ as needed -watch out for sedation  Use heat 10 minutes at a time  Stop at check out - for referral to physical therapy

## 2015-03-14 NOTE — Progress Notes (Signed)
Subjective:    Patient ID: Ashley [name], female    DOB: 1966-04-18, 49 y.o.   MRN: [id number]  HPI Here with low back pain   thurs am (out of town) -got out of bed  Back hurt from diff mattress Bent over to get something and could not get back up without help- severe pain  Radiates to both legs -also has "pins and needles"  R leg is worse/ and back pain is in the center  Right now pain is dull and throbbing  She tried aleve (did not take today) - and it does not help at all  No stretches  Uses heating pad - does not help much  No past hx of trauma     Last back pain - oct - and she had nl xrays   Patient Active Problem List   Diagnosis Date Noted  . Back pain with left-sided radiculopathy 09/28/2014  . Migraine with aura, intractable 08/12/2014  . Chest wall pain 07/23/2014  . RUQ abdominal pain 05/11/2014  . Diarrhea 05/07/2014  . S/P hysterectomy with oophorectomy 04/15/2014  . Abdominal pain, unspecified site 02/04/2014  . Intractable migraine 10/09/2013  . Hyperglycemia 01/26/2013  . Dyspareunia 01/26/2013  . Routine general medical examination at a health care facility 05/24/2011  . Migraine 05/30/2010  . Anxiety state 09/30/2009  . TOBACCO ABUSE 09/30/2009  . INSOMNIA, CHRONIC 09/30/2009  . ADJUSTMENT DISORDER WITH MIXED FEATURES 09/30/2009  . ELEVATED BLOOD PRESSURE WITHOUT DIAGNOSIS OF HYPERTENSION 09/30/2009   Past Medical History  Diagnosis Date  . Anxiety   . Tobacco abuse   . Cataract     bilateral  . SVD (spontaneous vaginal delivery)     x 3  . Bronchitis     uses inhaler if needed for bronchitis, lasted used -2014  . GERD (gastroesophageal reflux disease)   . History of blood transfusion November 08, 1966    at birth in [place], [place], unsure number of units  . Diabetes mellitus without complication     taken off Metformin since 04/2014, HgbA1C - normal, will follow up with PCP- Dr. Deborra Medina, 07/2014  . H/O exercise stress test 2011    done at [facility]- told  that it was WNL, done due to pt. having panic attacks   . Chronic headaches     migraines - in past, uses Phenergan for nausea   . Poor dentition     very poor oral health    Past Surgical History  Procedure Laterality Date  . Appendectomy  1988  . Tubal ligation    . Esophagogastroduodenoscopy  12/28/2013  . Knee arthroscopy  1995    left  . Colonoscopy  02/16/2014    normal   . Laparoscopic assisted vaginal hysterectomy N/A 04/15/2014    Procedure: LAPAROSCOPIC ASSISTED VAGINAL HYSTERECTOMY;  Surgeon: Marylynn Pearson, MD;  Location: [facility];  Service: Gynecology;  Laterality: N/A;  . Laparoscopic bilateral salpingo oopherectomy Bilateral 04/15/2014    Procedure: LAPAROSCOPIC BILATERAL SALPINGO OOPHORECTOMY;  Surgeon: Marylynn Pearson, MD;  Location: [facility];  Service: Gynecology;  Laterality: Bilateral;  . Abdominal hysterectomy    . Cholecystectomy N/A 06/07/2014    Procedure: LAPAROSCOPIC CHOLECYSTECTOMY;  Surgeon: Ralene Ok, MD;  Location: [facility];  Service: General;  Laterality: N/A;  . Cholecystectomy  June 07 2014   History  Substance Use Topics  . Smoking status: Former Smoker -- 1.00 packs/day for 35 years    Types: Cigarettes, E-cigarettes    Quit date: 02/02/2014  .  Smokeless tobacco: Never Used  . Alcohol Use: No   Family History  Problem Relation Age of Onset  . Diabetes Mother   . Hyperlipidemia Mother   . Hypertension Mother   . Colon cancer Neg Hx   . Rectal cancer Neg Hx   . Stomach cancer Neg Hx   . Prostate cancer Father   . Lung cancer Maternal Grandfather    Allergies  Allergen Reactions  . Penicillins Anaphylaxis    REACTION: Throat swells  . Topamax [Topiramate]     Hands and feet to go numb   Current Outpatient Prescriptions on File Prior to Visit  Medication Sig Dispense Refill  . amitriptyline (ELAVIL) 25 MG tablet Take 1-1/2 tablet at dinner time 45 tablet 3  . clonazePAM (KLONOPIN) 1 MG tablet Take 1 tablet (1 mg total) by mouth 3  (three) times daily as needed for anxiety. 90 tablet 0  . dicyclomine (BENTYL) 10 MG capsule Take 1 tab twice daily as needed for cramping and abdominal pain. 60 capsule 4  . eletriptan (RELPAX) 40 MG tablet One tablet by mouth at onset of headache. May repeat in 2 hours if headache persists or recurs. Do not take more than 3 tablets a week. 10 tablet 3  . ibuprofen (ADVIL,MOTRIN) 200 MG tablet Take 200 mg by mouth. Takes 3 tablets every 6 hours prn    . metFORMIN (GLUCOPHAGE) 500 MG tablet Take 1 tablet (500 mg total) by mouth daily with breakfast. 90 tablet 3  . omeprazole (PRILOSEC) 40 MG capsule Take 40 mg by mouth daily before breakfast. Take 1 tab twice daily for one month then go to once daily    . polyethylene glycol powder (GLYCOLAX/MIRALAX) powder Take 17 g by mouth daily. Until daily soft stools  OTC    . promethazine (PHENERGAN) 25 MG tablet Take 1 tab by mouth every 6 hours as needed for nausea 90 tablet 3  . rizatriptan (MAXALT-MLT) 10 MG disintegrating tablet Take 1 tablet (10 mg total) by mouth as needed for migraine. May repeat in 2 hours if needed. Do not take more than 3 tablets in a week. 10 tablet 3  . traZODone (DESYREL) 150 MG tablet Take 1 tablet (150 mg total) by mouth at bedtime. 90 tablet 0  . verapamil (CALAN) 40 MG tablet Take 1 tablet daily 30 tablet 4   No current facility-administered medications on file prior to visit.      Review of Systems Review of Systems  Constitutional: Negative for fever, appetite change, fatigue and unexpected weight change.  Eyes: Negative for pain and visual disturbance.  Respiratory: Negative for cough and shortness of breath.   Cardiovascular: Negative for cp or palpitations    Gastrointestinal: Negative for nausea, diarrhea and constipation.  Genitourinary: Negative for urgency and frequency.  Skin: Negative for pallor or rash   MSK pos for low back pain that rad to legs  Neurological: Negative for weakness, light-headedness,   and headaches.  Hematological: Negative for adenopathy. Does not bruise/bleed easily.  Psychiatric/Behavioral: Negative for dysphoric mood. The patient is not nervous/anxious.         Objective:   Physical Exam  Constitutional: She appears well-developed and well-nourished. No distress.  HENT:  Head: Normocephalic and atraumatic.  Neck: Normal range of motion. Neck supple.  Cardiovascular: Normal rate.   Musculoskeletal: She exhibits tenderness. She exhibits no edema.       Lumbar back: She exhibits decreased range of motion, tenderness, bony tenderness and spasm. She  exhibits no edema and no deformity.  Pos bilat SLR for back and leg pain   Tender over L3- L5  Flex 30 deg Ext 0 deg (pain) Pain on R lat flex  Nl rom hips  Slow gait   4/5 toe abductor strength bilat (per pt due to pain-not weakness) Otherwise neurol intact   Neurological: She is alert. She has normal reflexes. She displays no atrophy. No sensory deficit. She exhibits normal muscle tone.  Skin: Skin is warm and dry. No rash noted. No erythema.  Psychiatric: She has a normal mood and affect.          Assessment & Plan:   Problem List Items Addressed This Visit      Other   Low back pain with sciatica - Primary    Started last thrusday after sleeping on a bad mattress and not improving  Reviewed xray from fall-nl  Suspect acute spasm causing sciatic nerve pain  tx with meloxicam (inst of aleve) , and flexeril prn with caution  Disc use of heat Ref to PT - for eval and tx       Relevant Medications   meloxicam (MOBIC) tablet   cyclobenzaprine (FLEXERIL) tablet   Other Relevant Orders   Ambulatory referral to Physical Therapy

## 2015-03-15 ENCOUNTER — Other Ambulatory Visit: Payer: Self-pay | Admitting: Family Medicine

## 2015-03-23 ENCOUNTER — Encounter: Payer: Self-pay | Admitting: Primary Care

## 2015-03-23 ENCOUNTER — Ambulatory Visit (INDEPENDENT_AMBULATORY_CARE_PROVIDER_SITE_OTHER)
Admission: RE | Admit: 2015-03-23 | Discharge: 2015-03-23 | Disposition: A | Discharge status: Home / Self Care | Payer: No Typology Code available for payment source | Source: Ambulatory Visit | Attending: Primary Care | Admitting: Primary Care

## 2015-03-23 ENCOUNTER — Ambulatory Visit (INDEPENDENT_AMBULATORY_CARE_PROVIDER_SITE_OTHER): Payer: No Typology Code available for payment source | Admitting: Primary Care

## 2015-03-23 VITALS — BP 126/70 | HR 74 | Temp 97.9°F | Ht 62.0 in | Wt 148.0 lb

## 2015-03-23 DIAGNOSIS — R109 Unspecified abdominal pain: Secondary | ICD-10-CM

## 2015-03-23 DIAGNOSIS — R1010 Upper abdominal pain, unspecified: Secondary | ICD-10-CM

## 2015-03-23 DIAGNOSIS — R300 Dysuria: Secondary | ICD-10-CM | POA: Diagnosis not present

## 2015-03-23 LAB — CBC WITH DIFFERENTIAL/PLATELET
Basophils Absolute: 0 10*3/uL (ref 0.0–0.1)
Basophils Relative: 0.6 % (ref 0.0–3.0)
EOS PCT: 1.8 % (ref 0.0–5.0)
Eosinophils Absolute: 0.1 10*3/uL (ref 0.0–0.7)
HCT: 38.8 % (ref 36.0–46.0)
Hemoglobin: 13.3 g/dL (ref 12.0–15.0)
LYMPHS PCT: 27.8 % (ref 12.0–46.0)
Lymphs Abs: 2.2 10*3/uL (ref 0.7–4.0)
MCHC: 34.1 g/dL (ref 30.0–36.0)
MCV: 92.4 fl (ref 78.0–100.0)
MONO ABS: 0.6 10*3/uL (ref 0.1–1.0)
MONOS PCT: 7.6 % (ref 3.0–12.0)
Neutro Abs: 4.9 10*3/uL (ref 1.4–7.7)
Neutrophils Relative %: 62.2 % (ref 43.0–77.0)
Platelets: 392 10*3/uL (ref 150.0–400.0)
RBC: 4.2 Mil/uL (ref 3.87–5.11)
RDW: 13.9 % (ref 11.5–15.5)
WBC: 7.9 10*3/uL (ref 4.0–10.5)

## 2015-03-23 LAB — BASIC METABOLIC PANEL
BUN: 8 mg/dL (ref 6–23)
CO2: 30 mEq/L (ref 19–32)
Calcium: 10.2 mg/dL (ref 8.4–10.5)
Chloride: 105 mEq/L (ref 96–112)
Creatinine, Ser: 0.75 mg/dL (ref 0.40–1.20)
GFR: 87.23 mL/min (ref >60.00)
Glucose, Bld: 103 mg/dL — ABNORMAL HIGH (ref 70–99)
POTASSIUM: 4.6 meq/L (ref 3.5–5.1)
Sodium: 139 mEq/L (ref 135–145)

## 2015-03-23 LAB — POCT URINALYSIS DIPSTICK
Bilirubin, UA: NEGATIVE
Blood, UA: NEGATIVE
GLUCOSE UA: NEGATIVE
Ketones, UA: NEGATIVE
Leukocytes, UA: NEGATIVE
Nitrite, UA: NEGATIVE
PH UA: 7
PROTEIN UA: NEGATIVE
Spec Grav, UA: 1.015
UROBILINOGEN UA: 4

## 2015-03-23 NOTE — Assessment & Plan Note (Signed)
No systemic symptoms, vitals unremarkable in office. +CVA tenderness bilaterally, especially on right side. UA negative for leuks, nitrites, blood. Will get CBC, BMP, and KUB to rule out kidney stones.

## 2015-03-23 NOTE — Patient Instructions (Signed)
Obtain lab work prior to leaving today. Obtain X-Ray of your Kidneys. Push lots of water and use Monistat over the counter for vaginal itching. I will notify you of your results this afternoon. Take care!

## 2015-03-23 NOTE — Progress Notes (Signed)
Subjective:    Patient ID: Ashley [name], female    DOB: April 19, 1966, 49 y.o.   MRN: [id number]  HPI  Ashley [name] is a 49 year old female who presents today with a chief complaint of right flank pain, dysuria and malodorous urine that began yesterday morning. She does report mild vaginal itching which is not bothersome. She was treated last week for low back pain with sciatica and provided Meloxicam and Flexeril. Her sciatica has resolved but has since then has developed her right flank pain without radiation to her groin. She denies hematruia, fever, nausea, vomiting, vaginal discharge, vaginal pain. She's not taken anything OTC., but has continued her meloxicam from last week.   Review of Systems  Constitutional: Negative for fever and chills.  Respiratory: Negative for shortness of breath.   Cardiovascular: Negative for chest pain.  Gastrointestinal: Negative for nausea, abdominal pain and constipation.  Genitourinary: Positive for dysuria, frequency and flank pain. Negative for vaginal discharge and vaginal pain.  Musculoskeletal: Positive for myalgias.  Psychiatric/Behavioral: The patient is nervous/anxious.        Past Medical History  Diagnosis Date  . Anxiety   . Tobacco abuse   . Cataract     bilateral  . SVD (spontaneous vaginal delivery)     x 3  . Bronchitis     uses inhaler if needed for bronchitis, lasted used -2014  . GERD (gastroesophageal reflux disease)   . History of blood transfusion 12-11-1966    at birth in [place], [place], unsure number of units  . Diabetes mellitus without complication     taken off Metformin since 04/2014, HgbA1C - normal, will follow up with PCP- Dr. Deborra Medina, 07/2014  . H/O exercise stress test 2011    done at [facility]- told that it was WNL, done due to pt. having panic attacks   . Chronic headaches     migraines - in past, uses Phenergan for nausea   . Poor dentition     very poor oral health     History   Social History  . Marital Status:  Married    Spouse Name: N/A  . Number of Children: 6  . Years of Education: N/A   Occupational History  .       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 Smoking status: Former Smoker -- 1.00 packs/day for 35 years    Types: Cigarettes, E-cigarettes    Quit date: 02/02/2014  . Smokeless tobacco: Never Used  . Alcohol Use: No  . Drug Use: No  . Sexual Activity: Yes    Birth Control/ Protection: Pill   Other Topics Concern  . Not on file   Social History Narrative   5 living children, one child is a crack cocaine addict who often breaks into their house to steal money. One child died of cerebral palsy.    Past Surgical History  Procedure Laterality Date  . Appendectomy  1988  . Tubal ligation    . Esophagogastroduodenoscopy  12/28/2013  . Knee arthroscopy  1995    left  . Colonoscopy  02/16/2014    normal   . Laparoscopic assisted vaginal hysterectomy N/A 04/15/2014    Procedure: LAPAROSCOPIC ASSISTED VAGINAL HYSTERECTOMY;  Surgeon: Marylynn Pearson, MD;  Location: [facility];  Service: Gynecology;  Laterality: N/A;  . Laparoscopic bilateral salpingo oopherectomy Bilateral 04/15/2014    Procedure: LAPAROSCOPIC BILATERAL SALPINGO OOPHORECTOMY;  Surgeon: Marylynn Pearson, MD;  Location: [facility];  Service: Gynecology;  Laterality: Bilateral;  . Abdominal hysterectomy    . Cholecystectomy N/A 06/07/2014    Procedure: LAPAROSCOPIC CHOLECYSTECTOMY;  Surgeon: Ralene Ok, MD;  Location: [facility];  Service: General;  Laterality: N/A;  . Cholecystectomy  June 07 2014    Family History  Problem Relation Age of Onset  . Diabetes Mother   . Hyperlipidemia Mother   . Hypertension Mother   . Colon cancer Neg Hx   . Rectal cancer Neg Hx   . Stomach cancer Neg Hx   . Prostate cancer Father   . Lung cancer Maternal Grandfather     Allergies  Allergen Reactions  . Penicillins Anaphylaxis    REACTION: Throat swells  . Topamax [Topiramate]     Hands and feet to go  numb    Current Outpatient Prescriptions on File Prior to Visit  Medication Sig Dispense Refill  . amitriptyline (ELAVIL) 25 MG tablet Take 1-1/2 tablet at dinner time 45 tablet 3  . clonazePAM (KLONOPIN) 1 MG tablet Take 1 tablet (1 mg total) by mouth 3 (three) times daily as needed for anxiety. 90 tablet 0  . cyclobenzaprine (FLEXERIL) 10 MG tablet Take 1 tablet (10 mg total) by mouth 3 (three) times daily as needed for muscle spasms (watch out for sedation). 30 tablet 0  . dicyclomine (BENTYL) 10 MG capsule Take 1 tab twice daily as needed for cramping and abdominal pain. 60 capsule 4  . eletriptan (RELPAX) 40 MG tablet One tablet by mouth at onset of headache. May repeat in 2 hours if headache persists or recurs. Do not take more than 3 tablets a week. 10 tablet 3  . ibuprofen (ADVIL,MOTRIN) 200 MG tablet Take 200 mg by mouth. Takes 3 tablets every 6 hours prn    . meloxicam (MOBIC) 15 MG tablet Take 1 tablet (15 mg total) by mouth daily. Take with food for low back pain 30 tablet 1  . metFORMIN (GLUCOPHAGE) 500 MG tablet Take 1 tablet (500 mg total) by mouth daily with breakfast. 90 tablet 3  . omeprazole (PRILOSEC) 40 MG capsule TAKE 1 CAPSULE DAILY 120 capsule 3  . polyethylene glycol powder (GLYCOLAX/MIRALAX) powder Take 17 g by mouth daily. Until daily soft stools  OTC    . promethazine (PHENERGAN) 25 MG tablet Take 1 tab by mouth every 6 hours as needed for nausea 90 tablet 3  . rizatriptan (MAXALT-MLT) 10 MG disintegrating tablet Take 1 tablet (10 mg total) by mouth as needed for migraine. May repeat in 2 hours if needed. Do not take more than 3 tablets in a week. 10 tablet 3  . traZODone (DESYREL) 150 MG tablet Take 1 tablet (150 mg total) by mouth at bedtime. 90 tablet 0  . verapamil (CALAN) 40 MG tablet Take 1 tablet daily 30 tablet 4   No current facility-administered medications on file prior to visit.    BP 126/70 mmHg  Pulse 74  Temp(Src) 97.9 F (36.6 C) (Oral)  Ht 5'  2" (1.575 m)  Wt 148 lb (67.132 kg)  BMI 27.06 kg/m2  SpO2 99%  LMP 02/12/2014    Objective:   Physical Exam  Constitutional: She is oriented to person, place, and time. She appears well-developed.  Neck: Neck supple.  Cardiovascular: Normal rate and regular rhythm.   Pulmonary/Chest: Effort normal and breath sounds normal.  Abdominal: Soft. Bowel sounds are normal. There is no tenderness. There is CVA tenderness.  Lymphadenopathy:    She has no cervical adenopathy.  Neurological: She is alert and oriented to person, place, and time.  Skin: Skin is warm and dry.  Psychiatric: She has a normal mood and affect.          Assessment & Plan:

## 2015-03-23 NOTE — Progress Notes (Signed)
Pre visit review using our clinic review tool, if applicable. No additional management support is needed unless otherwise documented below in the visit note. 

## 2015-03-30 ENCOUNTER — Encounter: Payer: Self-pay | Admitting: Family Medicine

## 2015-03-30 ENCOUNTER — Ambulatory Visit (INDEPENDENT_AMBULATORY_CARE_PROVIDER_SITE_OTHER): Payer: No Typology Code available for payment source | Admitting: Family Medicine

## 2015-03-30 VITALS — BP 140/80 | HR 71 | Temp 97.9°F | Wt 147.8 lb

## 2015-03-30 DIAGNOSIS — J069 Acute upper respiratory infection, unspecified: Secondary | ICD-10-CM | POA: Diagnosis not present

## 2015-03-30 MED ORDER — AZITHROMYCIN 250 MG PO TABS: ORAL_TABLET | ORAL | Status: DC

## 2015-03-30 MED ORDER — ALBUTEROL SULFATE HFA 108 (90 BASE) MCG/ACT IN AERS: 2.0000 | INHALATION_SPRAY | Freq: Four times a day (QID) | RESPIRATORY_TRACT | Status: DC | PRN

## 2015-03-30 MED ORDER — FLUTICASONE PROPIONATE 50 MCG/ACT NA SUSP: 2.0000 | Freq: Every day | NASAL | Status: DC

## 2015-03-30 NOTE — Progress Notes (Signed)
SUBJECTIVE:  Ashley [name] is a 49 y.o. female who complains of coryza, congestion, sneezing, productive cough,chills, and bilateral sinus pain for 8 days. She denies a history of chest pain, dizziness and fatigue and denies a history of asthma. Patient denies smoke cigarettes.   Current Outpatient Prescriptions on File Prior to Visit  Medication Sig Dispense Refill  . amitriptyline (ELAVIL) 25 MG tablet Take 1-1/2 tablet at dinner time 45 tablet 3  . clonazePAM (KLONOPIN) 1 MG tablet Take 1 tablet (1 mg total) by mouth 3 (three) times daily as needed for anxiety. 90 tablet 0  . cyclobenzaprine (FLEXERIL) 10 MG tablet Take 1 tablet (10 mg total) by mouth 3 (three) times daily as needed for muscle spasms (watch out for sedation). 30 tablet 0  . dicyclomine (BENTYL) 10 MG capsule Take 1 tab twice daily as needed for cramping and abdominal pain. 60 capsule 4  . ibuprofen (ADVIL,MOTRIN) 200 MG tablet Take 200 mg by mouth. Takes 3 tablets every 6 hours prn    . meloxicam (MOBIC) 15 MG tablet Take 1 tablet (15 mg total) by mouth daily. Take with food for low back pain 30 tablet 1  . metFORMIN (GLUCOPHAGE) 500 MG tablet Take 1 tablet (500 mg total) by mouth daily with breakfast. 90 tablet 3  . omeprazole (PRILOSEC) 40 MG capsule TAKE 1 CAPSULE DAILY 120 capsule 3  . polyethylene glycol powder (GLYCOLAX/MIRALAX) powder Take 17 g by mouth daily. Until daily soft stools  OTC    . promethazine (PHENERGAN) 25 MG tablet Take 1 tab by mouth every 6 hours as needed for nausea 90 tablet 3  . rizatriptan (MAXALT-MLT) 10 MG disintegrating tablet Take 1 tablet (10 mg total) by mouth as needed for migraine. May repeat in 2 hours if needed. Do not take more than 3 tablets in a week. 10 tablet 3  . traZODone (DESYREL) 150 MG tablet Take 1 tablet (150 mg total) by mouth at bedtime. 90 tablet 0  . verapamil (CALAN) 40 MG tablet Take 1 tablet daily 30 tablet 4   No current facility-administered medications on file prior  to visit.    Allergies  Allergen Reactions  . Penicillins Anaphylaxis    REACTION: Throat swells  . Topamax [Topiramate]     Hands and feet to go numb    Past Medical History  Diagnosis Date  . Anxiety   . Tobacco abuse   . Cataract     bilateral  . SVD (spontaneous vaginal delivery)     x 3  . Bronchitis     uses inhaler if needed for bronchitis, lasted used -2014  . GERD (gastroesophageal reflux disease)   . History of blood transfusion [birth year] 30[birth year] 1966    at birth in [place], [place], unsure number of units  . Diabetes mellitus without complication     taken off Metformin since 04/2014, HgbA1C - normal, will follow up with PCP- Dr. Deborra Medina, 07/2014  . H/O exercise stress test 2011    done at [facility]- told that it was WNL, done due to pt. having panic attacks   . Chronic headaches     migraines - in past, uses Phenergan for nausea   . Poor dentition     very poor oral health     Past Surgical History  Procedure Laterality Date  . Appendectomy  1988  . Tubal ligation    . Esophagogastroduodenoscopy  12/28/2013  . Knee arthroscopy  1995    left  . Colonoscopy  02/16/2014    normal   . Laparoscopic assisted vaginal hysterectomy N/A 04/15/2014    Procedure: LAPAROSCOPIC ASSISTED VAGINAL HYSTERECTOMY;  Surgeon: Marylynn Pearson, MD;  Location: [facility];  Service: Gynecology;  Laterality: N/A;  . Laparoscopic bilateral salpingo oopherectomy Bilateral 04/15/2014    Procedure: LAPAROSCOPIC BILATERAL SALPINGO OOPHORECTOMY;  Surgeon: Marylynn Pearson, MD;  Location: [facility];  Service: Gynecology;  Laterality: Bilateral;  . Abdominal hysterectomy    . Cholecystectomy N/A 06/07/2014    Procedure: LAPAROSCOPIC CHOLECYSTECTOMY;  Surgeon: Ralene Ok, MD;  Location: [facility];  Service: General;  Laterality: N/A;  . Cholecystectomy  June 07 2014    Family History  Problem Relation Age of Onset  . Diabetes Mother   . Hyperlipidemia Mother   . Hypertension Mother   . Colon cancer Neg Hx   .  Rectal cancer Neg Hx   . Stomach cancer Neg Hx   . Prostate cancer Father   . Lung cancer Maternal Grandfather     History   Social History  . Marital Status: Married    Spouse Name: N/A  . Number of Children: 6  . Years of Education: N/A   Occupational History  .       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 Smoking status: Former Smoker -- 1.00 packs/day for 35 years    Types: Cigarettes, E-cigarettes    Quit date: 02/02/2014  . Smokeless tobacco: Never Used  . Alcohol Use: No  . Drug Use: No  . Sexual Activity: Yes    Birth Control/ Protection: Pill   Other Topics Concern  . Not on file   Social History Narrative   5 living children, one child is a crack cocaine addict who often breaks into their house to steal money. One child died of cerebral palsy.   The PMH, PSH, Social History, Family History, Medications, and allergies have been reviewed in [facility], and have been updated if relevant.  OBJECTIVE: BP 140/80 mmHg  Pulse 71  Temp(Src) 97.9 F (36.6 C) (Oral)  Wt 147 lb 12 oz (67.019 kg)  SpO2 98%  LMP 02/12/2014  She appears well, vital signs are as noted. Ears normal.  Throat and pharynx normal.  Neck supple. No adenopathy in the neck. Nose is congested. Sinuses tender. Wheezes bilateral lower lung bases with rales  ASSESSMENT:  sinusitis and bronchitis  PLAN: Zpack, flonase, as needed proair. Symptomatic therapy suggested: push fluids, rest and return office visit prn if symptoms persist or worsen. . Call or return to clinic prn if these symptoms worsen or fail to improve as anticipated.

## 2015-03-30 NOTE — Patient Instructions (Signed)
Good to see you. Take Zpack as directed along with flonase and as needed proair for shortness of breath, cough and wheezing.

## 2015-03-30 NOTE — Progress Notes (Signed)
Pre visit review using our clinic review tool, if applicable. No additional management support is needed unless otherwise documented below in the visit note. 

## 2015-03-31 ENCOUNTER — Other Ambulatory Visit: Payer: Self-pay

## 2015-03-31 MED ORDER — ALBUTEROL SULFATE HFA 108 (90 BASE) MCG/ACT IN AERS: 2.0000 | INHALATION_SPRAY | Freq: Four times a day (QID) | RESPIRATORY_TRACT | Status: DC | PRN

## 2015-03-31 MED ORDER — AZITHROMYCIN 250 MG PO TABS: ORAL_TABLET | ORAL | Status: DC

## 2015-03-31 MED ORDER — FLUTICASONE PROPIONATE 50 MCG/ACT NA SUSP: 2.0000 | Freq: Every day | NASAL | Status: DC

## 2015-03-31 NOTE — Telephone Encounter (Signed)
Pt cancelled [facility] and request resent to [place].advised pt done.

## 2015-04-01 ENCOUNTER — Other Ambulatory Visit: Payer: Self-pay | Admitting: Family Medicine

## 2015-04-01 DIAGNOSIS — Z0000 Encounter for general adult medical examination without abnormal findings: Secondary | ICD-10-CM

## 2015-04-05 ENCOUNTER — Ambulatory Visit: Payer: No Typology Code available for payment source | Admitting: Neurology

## 2015-04-07 ENCOUNTER — Other Ambulatory Visit (INDEPENDENT_AMBULATORY_CARE_PROVIDER_SITE_OTHER): Payer: No Typology Code available for payment source

## 2015-04-07 DIAGNOSIS — R739 Hyperglycemia, unspecified: Secondary | ICD-10-CM

## 2015-04-07 DIAGNOSIS — Z0000 Encounter for general adult medical examination without abnormal findings: Secondary | ICD-10-CM | POA: Diagnosis not present

## 2015-04-07 LAB — CBC WITH DIFFERENTIAL/PLATELET
BASOS PCT: 0.5 % (ref 0.0–3.0)
Basophils Absolute: 0 10*3/uL (ref 0.0–0.1)
EOS PCT: 3 % (ref 0.0–5.0)
Eosinophils Absolute: 0.2 10*3/uL (ref 0.0–0.7)
HCT: 38.5 % (ref 36.0–46.0)
Hemoglobin: 13.3 g/dL (ref 12.0–15.0)
LYMPHS PCT: 40.3 % (ref 12.0–46.0)
Lymphs Abs: 2.5 10*3/uL (ref 0.7–4.0)
MCHC: 34.6 g/dL (ref 30.0–36.0)
MCV: 92.2 fl (ref 78.0–100.0)
MONO ABS: 0.5 10*3/uL (ref 0.1–1.0)
MONOS PCT: 7.6 % (ref 3.0–12.0)
NEUTROS PCT: 48.6 % (ref 43.0–77.0)
Neutro Abs: 3 10*3/uL (ref 1.4–7.7)
Platelets: 418 10*3/uL — ABNORMAL HIGH (ref 150.0–400.0)
RBC: 4.17 Mil/uL (ref 3.87–5.11)
RDW: 13.9 % (ref 11.5–15.5)
WBC: 6.2 10*3/uL (ref 4.0–10.5)

## 2015-04-07 LAB — COMPREHENSIVE METABOLIC PANEL
ALK PHOS: 108 U/L (ref 39–117)
ALT: 12 U/L (ref 0–35)
AST: 13 U/L (ref 0–37)
Albumin: 4.3 g/dL (ref 3.5–5.2)
BILIRUBIN TOTAL: 0.4 mg/dL (ref 0.2–1.2)
BUN: 10 mg/dL (ref 6–23)
CO2: 29 mEq/L (ref 19–32)
Calcium: 9.8 mg/dL (ref 8.4–10.5)
Chloride: 103 mEq/L (ref 96–112)
Creatinine, Ser: 0.76 mg/dL (ref 0.40–1.20)
GFR: 85.89 mL/min (ref >60.00)
Glucose, Bld: 106 mg/dL — ABNORMAL HIGH (ref 70–99)
Potassium: 4.3 mEq/L (ref 3.5–5.1)
Sodium: 138 mEq/L (ref 135–145)
TOTAL PROTEIN: 7.5 g/dL (ref 6.0–8.3)

## 2015-04-07 LAB — LIPID PANEL
CHOLESTEROL: 212 mg/dL — AB (ref 0–200)
HDL: 48.3 mg/dL (ref >39.00)
LDL Cholesterol: 150 mg/dL — ABNORMAL HIGH (ref 0–99)
NonHDL: 163.7
TRIGLYCERIDES: 68 mg/dL (ref 0.0–149.0)
Total CHOL/HDL Ratio: 4
VLDL: 13.6 mg/dL (ref 0.0–40.0)

## 2015-04-07 LAB — HEMOGLOBIN A1C: Hgb A1c MFr Bld: 5.7 % (ref 4.6–6.5)

## 2015-04-07 LAB — TSH: TSH: 2.07 u[IU]/mL (ref 0.35–4.50)

## 2015-04-12 ENCOUNTER — Other Ambulatory Visit (HOSPITAL_COMMUNITY)
Admission: RE | Admit: 2015-04-12 | Discharge: 2015-04-12 | Disposition: A | Discharge status: Home / Self Care | Payer: No Typology Code available for payment source | Source: Ambulatory Visit | Attending: Family Medicine | Admitting: Family Medicine

## 2015-04-12 ENCOUNTER — Encounter: Payer: Self-pay | Admitting: Family Medicine

## 2015-04-12 ENCOUNTER — Ambulatory Visit (INDEPENDENT_AMBULATORY_CARE_PROVIDER_SITE_OTHER): Payer: No Typology Code available for payment source | Admitting: Family Medicine

## 2015-04-12 VITALS — BP 122/70 | HR 73 | Temp 97.6°F | Ht 62.25 in | Wt 148.5 lb

## 2015-04-12 DIAGNOSIS — Z1239 Encounter for other screening for malignant neoplasm of breast: Secondary | ICD-10-CM

## 2015-04-12 DIAGNOSIS — N952 Postmenopausal atrophic vaginitis: Secondary | ICD-10-CM

## 2015-04-12 DIAGNOSIS — Z01419 Encounter for gynecological examination (general) (routine) without abnormal findings: Secondary | ICD-10-CM | POA: Insufficient documentation

## 2015-04-12 DIAGNOSIS — Z0000 Encounter for general adult medical examination without abnormal findings: Secondary | ICD-10-CM | POA: Diagnosis not present

## 2015-04-12 DIAGNOSIS — E785 Hyperlipidemia, unspecified: Secondary | ICD-10-CM

## 2015-04-12 DIAGNOSIS — F4323 Adjustment disorder with mixed anxiety and depressed mood: Secondary | ICD-10-CM

## 2015-04-12 DIAGNOSIS — Z90710 Acquired absence of both cervix and uterus: Secondary | ICD-10-CM | POA: Diagnosis not present

## 2015-04-12 DIAGNOSIS — Z1151 Encounter for screening for human papillomavirus (HPV): Secondary | ICD-10-CM | POA: Insufficient documentation

## 2015-04-12 DIAGNOSIS — G4700 Insomnia, unspecified: Secondary | ICD-10-CM

## 2015-04-12 DIAGNOSIS — F17200 Nicotine dependence, unspecified, uncomplicated: Secondary | ICD-10-CM

## 2015-04-12 DIAGNOSIS — G43119 Migraine with aura, intractable, without status migrainosus: Secondary | ICD-10-CM

## 2015-04-12 DIAGNOSIS — Z90721 Acquired absence of ovaries, unilateral: Secondary | ICD-10-CM

## 2015-04-12 LAB — HM PAP SMEAR

## 2015-04-12 MED ORDER — CLONAZEPAM 1 MG PO TABS: 1.0000 mg | ORAL_TABLET | Freq: Three times a day (TID) | ORAL | Status: DC | PRN

## 2015-04-12 MED ORDER — ESTROGENS CONJUGATED 0.3 MG PO TABS: 0.3000 mg | ORAL_TABLET | Freq: Every day | ORAL | Status: DC

## 2015-04-12 NOTE — Assessment & Plan Note (Signed)
>  25 min spent with face to face with patient, >50% counseling and/or coordinating care Discussed risks and benefits of HRT.  Pt does not have a uterus and therefore does not need  progesterone with estrogen to prevent endometrial hyperplasia. eRx sent for premarin 0.3 mg daily.

## 2015-04-12 NOTE — Assessment & Plan Note (Signed)
New- discussed low cholesterol diet.  Handout given.  Recheck cholesterol in 3- 6 months.  The patient indicates understanding of these issues and agrees with the plan.

## 2015-04-12 NOTE — Progress Notes (Signed)
Pre visit review using our clinic review tool, if applicable. No additional management support is needed unless otherwise documented below in the visit note. 

## 2015-04-12 NOTE — Patient Instructions (Addendum)
Good to see you. Please call the breast center to set up your mammogram.  Please work on cutting back on fried foods and we will recheck your cholesterol in 3-6 months.  Take premarin as directed- please update me with your symptoms.

## 2015-04-12 NOTE — Assessment & Plan Note (Signed)
Reviewed preventive care protocols, scheduled due services, and updated immunizations Discussed nutrition, exercise, diet, and healthy lifestyle.  Pap smear done today. 

## 2015-04-12 NOTE — Addendum Note (Signed)
Addended by: Modena Nunnery on: 04/12/2015 08:12 AM   Modules accepted: Orders

## 2015-04-12 NOTE — Assessment & Plan Note (Signed)
Improved with elavil, also less cluster headaches with verapamil.  I told pt that I am comfortable refilling these when she needs refills.

## 2015-04-12 NOTE — Progress Notes (Signed)
Subjective:   Patient ID: Ashley [name], female    DOB: [birth year], 49 y.o.   MRN: [id number]  Ashley [name] is a pleasant 49 y.o. year old female who presents to clinic today with Annual Exam and bladder leakage  on 04/12/2015 Also would like to follow up chronic medical conditions.  HPI:  S/p hysterectomy/oophrectomy but still has a cervix.  Due for pap smear with Dr. Julien Girt but would prefer for me to do it here today.  Mammogram 03/09/14  Vaginal atrophy and pain with intercourse- worsening despite using OTC lubricants. Also having more bladder leakage with she coughs, laughs or sneezes.  GERD- controlled with bentyl and phenergan.  Migraines- improved since she started Elavil.  Also is no longer having cluster headaches since she started Verapamil.  These were started by neurology.  She would like for me to take this over. Has not noticed any side effects.  Using maxalt for abortive therapy.   Hyperglycemia- has been controlled on Metformin 500 mg with breakfast.  Lab Results  Component Value Date   HGBA1C 5.7 04/07/2015   HLD- new. Daughter moved in.  Eating more fried foods.   Lab Results  Component Value Date   CHOL 212* 04/07/2015   HDL 48.30 04/07/2015   LDLCALC 150* 04/07/2015   LDLDIRECT 152.1 01/19/2013   TRIG 68.0 04/07/2015   CHOLHDL 4 04/07/2015   Lab Results  Component Value Date   WBC 6.2 04/07/2015   HGB 13.3 04/07/2015   HCT 38.5 04/07/2015   MCV 92.2 04/07/2015   PLT 418.0* 04/07/2015   Lab Results  Component Value Date   ALT 12 04/07/2015   AST 13 04/07/2015   ALKPHOS 108 04/07/2015   BILITOT 0.4 04/07/2015   Lab Results  Component Value Date   NA 138 04/07/2015   K 4.3 04/07/2015   CL 103 04/07/2015   CO2 29 04/07/2015     Patient Active Problem List   Diagnosis Date Noted  . Routine general medical examination at a health care facility 05/24/2011    Priority: High  . TOBACCO ABUSE 09/30/2009    Priority: High  . INSOMNIA, CHRONIC  09/30/2009    Priority: High  . Well woman exam with routine gynecological exam 04/12/2015  . Migraine with aura, intractable 08/12/2014  . Chest wall pain 07/23/2014  . RUQ abdominal pain 05/11/2014  . Diarrhea 05/07/2014  . S/P hysterectomy with oophorectomy 04/15/2014  . Abdominal pain, unspecified site 02/04/2014  . Intractable migraine 10/09/2013  . Hyperglycemia 01/26/2013  . Dyspareunia 01/26/2013  . Migraine 05/30/2010  . Anxiety state 09/30/2009  . ADJUSTMENT DISORDER WITH MIXED FEATURES 09/30/2009  . ELEVATED BLOOD PRESSURE WITHOUT DIAGNOSIS OF HYPERTENSION 09/30/2009   Past Medical History  Diagnosis Date  . Anxiety   . Tobacco abuse   . Cataract     bilateral  . SVD (spontaneous vaginal delivery)     x 3  . Bronchitis     uses inhaler if needed for bronchitis, lasted used -2014  . GERD (gastroesophageal reflux disease)   . History of blood transfusion 09/09/1966    at birth in [place], [place], unsure number of units  . Diabetes mellitus without complication     taken off Metformin since 04/2014, HgbA1C - normal, will follow up with PCP- Dr. Deborra Medina, 07/2014  . H/O exercise stress test 2011    done at [facility]- told that it was WNL, done due to pt. having panic attacks   . Chronic  headaches     migraines - in past, uses Phenergan for nausea   . Poor dentition     very poor oral health    Past Surgical History  Procedure Laterality Date  . Appendectomy  1988  . Tubal ligation    . Esophagogastroduodenoscopy  12/28/2013  . Knee arthroscopy  1995    left  . Colonoscopy  02/16/2014    normal   . Laparoscopic assisted vaginal hysterectomy N/A 04/15/2014    Procedure: LAPAROSCOPIC ASSISTED VAGINAL HYSTERECTOMY;  Surgeon: Marylynn Pearson, MD;  Location: [facility];  Service: Gynecology;  Laterality: N/A;  . Laparoscopic bilateral salpingo oopherectomy Bilateral 04/15/2014    Procedure: LAPAROSCOPIC BILATERAL SALPINGO OOPHORECTOMY;  Surgeon: Marylynn Pearson, MD;  Location: [facility];  Service: Gynecology;  Laterality: Bilateral;  . Abdominal hysterectomy    . Cholecystectomy N/A 06/07/2014    Procedure: LAPAROSCOPIC CHOLECYSTECTOMY;  Surgeon: Ralene Ok, MD;  Location: [facility];  Service: General;  Laterality: N/A;  . Cholecystectomy  June 07 2014   History  Substance Use Topics  . Smoking status: Former Smoker -- 1.00 packs/day for 35 years    Types: Cigarettes, E-cigarettes    Quit date: 02/02/2014  . Smokeless tobacco: Never Used  . Alcohol Use: No   Family History  Problem Relation Age of Onset  . Diabetes Mother   . Hyperlipidemia Mother   . Hypertension Mother   . Colon cancer Neg Hx   . Rectal cancer Neg Hx   . Stomach cancer Neg Hx   . Prostate cancer Father   . Lung cancer Maternal Grandfather    Allergies  Allergen Reactions  . Penicillins Anaphylaxis    REACTION: Throat swells  . Topamax [Topiramate]     Hands and feet to go numb   Current Outpatient Prescriptions on File Prior to Visit  Medication Sig Dispense Refill  . albuterol (PROVENTIL HFA;VENTOLIN HFA) 108 (90 BASE) MCG/ACT inhaler Inhale 2 puffs into the lungs every 6 (six) hours as needed. 1 Inhaler 0  . amitriptyline (ELAVIL) 25 MG tablet Take 1-1/2 tablet at dinner time 45 tablet 3  . clonazePAM (KLONOPIN) 1 MG tablet Take 1 tablet (1 mg total) by mouth 3 (three) times daily as needed for anxiety. 90 tablet 0  . cyclobenzaprine (FLEXERIL) 10 MG tablet Take 1 tablet (10 mg total) by mouth 3 (three) times daily as needed for muscle spasms (watch out for sedation). 30 tablet 0  . dicyclomine (BENTYL) 10 MG capsule Take 1 tab twice daily as needed for cramping and abdominal pain. 60 capsule 4  . fluticasone (FLONASE) 50 MCG/ACT nasal spray Place 2 sprays into both nostrils daily. 16 g 6  . ibuprofen (ADVIL,MOTRIN) 200 MG tablet Take 200 mg by mouth. Takes 3 tablets every 6 hours prn    . meloxicam (MOBIC) 15 MG tablet Take 1 tablet (15 mg total) by mouth daily. Take with food for  low back pain 30 tablet 1  . metFORMIN (GLUCOPHAGE) 500 MG tablet Take 1 tablet (500 mg total) by mouth daily with breakfast. 90 tablet 3  . omeprazole (PRILOSEC) 40 MG capsule TAKE 1 CAPSULE DAILY 120 capsule 3  . polyethylene glycol powder (GLYCOLAX/MIRALAX) powder Take 17 g by mouth daily. Until daily soft stools  OTC    . promethazine (PHENERGAN) 25 MG tablet Take 1 tab by mouth every 6 hours as needed for nausea 90 tablet 3  . rizatriptan (MAXALT-MLT) 10 MG disintegrating tablet Take 1 tablet (10 mg total) by  mouth as needed for migraine. May repeat in 2 hours if needed. Do not take more than 3 tablets in a week. 10 tablet 3  . traZODone (DESYREL) 150 MG tablet Take 1 tablet (150 mg total) by mouth at bedtime. 90 tablet 0  . verapamil (CALAN) 40 MG tablet Take 1 tablet daily 30 tablet 4   No current facility-administered medications on file prior to visit.   The PMH, PSH, Social History, Family History, Medications, and allergies have been reviewed in [facility], and have been updated if relevant.  Review of Systems  Constitutional: Negative for fever, activity change, appetite change and fatigue.  Respiratory: Negative for apnea, cough, chest tightness and shortness of breath.   Cardiovascular: Negative for chest pain.  Gastrointestinal: Negative for nausea, abdominal pain, diarrhea, constipation and blood in stool.  Genitourinary: Positive for urgency and dyspareunia. Negative for menstrual problem.  Neurological: Negative.   Hematological: Negative.   Psychiatric/Behavioral: Negative for hallucinations, behavioral problems, confusion, dysphoric mood, decreased concentration and agitation. The patient is not hyperactive.   All other systems reviewed and are negative.     Objective:    LMP 02/12/2014 BP 122/70 mmHg  Pulse 73  Temp(Src) 97.6 F (36.4 C) (Oral)  Ht 5' 2.25" (1.581 m)  Wt 148 lb 8 oz (67.359 kg)  BMI 26.95 kg/m2  SpO2 98%  LMP 02/12/2014   Physical Exam    Constitutional: She is oriented to person, place, and time. She appears well-developed and well-nourished.  Eyes: Pupils are equal, round, and reactive to light.  Neck: Normal range of motion.  Cardiovascular: Normal rate, regular rhythm and normal heart sounds.   Pulmonary/Chest: Effort normal and breath sounds normal.  Abdominal: Soft. She exhibits no mass. There is no tenderness. There is no rebound and no guarding.  Genitourinary: Rectum normal. No breast swelling, tenderness, discharge or bleeding. Cervix exhibits no motion tenderness, no discharge and no friability.  + vaginal atrophy  Musculoskeletal: Normal range of motion.  Neurological: She is alert and oriented to person, place, and time. She has normal reflexes.  Skin: Skin is warm and dry.  Psychiatric: She has a normal mood and affect. Her behavior is normal. Judgment normal.           Assessment & Plan:   Well woman exam with routine gynecological exam  INSOMNIA, CHRONIC  TOBACCO ABUSE  ADJUSTMENT DISORDER WITH MIXED FEATURES  S/P hysterectomy with oophorectomy No Follow-up on file.

## 2015-04-13 ENCOUNTER — Ambulatory Visit (INDEPENDENT_AMBULATORY_CARE_PROVIDER_SITE_OTHER): Payer: No Typology Code available for payment source | Admitting: Internal Medicine

## 2015-04-13 ENCOUNTER — Encounter: Payer: Self-pay | Admitting: Internal Medicine

## 2015-04-13 VITALS — BP 134/78 | HR 71 | Temp 98.3°F | Wt 147.8 lb

## 2015-04-13 DIAGNOSIS — G43909 Migraine, unspecified, not intractable, without status migrainosus: Secondary | ICD-10-CM

## 2015-04-13 LAB — CYTOLOGY - PAP

## 2015-04-13 MED ORDER — KETOROLAC TROMETHAMINE 30 MG/ML IJ SOLN
30.0000 mg | Freq: Once | INTRAMUSCULAR | Status: AC
  Administered 2015-04-13: 30 mg via INTRAMUSCULAR

## 2015-04-13 MED ORDER — PROMETHAZINE HCL 25 MG/ML IJ SOLN
12.5000 mg | Freq: Once | INTRAMUSCULAR | Status: AC
  Administered 2015-04-13: 12.5 mg via INTRAMUSCULAR

## 2015-04-13 NOTE — Patient Instructions (Signed)

## 2015-04-13 NOTE — Addendum Note (Signed)
Addended by: Lurlean Nanny on: 04/13/2015 04:38 PM   Modules accepted: Orders

## 2015-04-13 NOTE — Progress Notes (Signed)
Subjective:    Patient ID: Ashley [name], female    DOB: Feb 05, 1966, 49 y.o.   MRN: [id number]  HPI  Pt presents to the clinic today with c/o a migraine. This started this morning. It is similar to her previous migraines, which are normally located on the right side of her head and behind her right eye. She has associated nausea and sensitivity to light. She takes Verapamil and Amitriptyline daily and Maxalt for abortive therapy, but without any relief today. She does follow with neurology, Dr. Delice Lesch. She was last seen there 12/2014.  Review of Systems      Past Medical History  Diagnosis Date  . Anxiety   . Tobacco abuse   . Cataract     bilateral  . SVD (spontaneous vaginal delivery)     x 3  . Bronchitis     uses inhaler if needed for bronchitis, lasted used -2014  . GERD (gastroesophageal reflux disease)   . History of blood transfusion December 16, 1966    at birth in [place], [place], unsure number of units  . Diabetes mellitus without complication     taken off Metformin since 04/2014, HgbA1C - normal, will follow up with PCP- Dr. Deborra Medina, 07/2014  . H/O exercise stress test 2011    done at [facility]- told that it was WNL, done due to pt. having panic attacks   . Chronic headaches     migraines - in past, uses Phenergan for nausea   . Poor dentition     very poor oral health     Current Outpatient Prescriptions  Medication Sig Dispense Refill  . albuterol (PROVENTIL HFA;VENTOLIN HFA) 108 (90 BASE) MCG/ACT inhaler Inhale 2 puffs into the lungs every 6 (six) hours as needed. 1 Inhaler 0  . amitriptyline (ELAVIL) 25 MG tablet Take 1-1/2 tablet at dinner time 45 tablet 3  . clonazePAM (KLONOPIN) 1 MG tablet Take 1 tablet (1 mg total) by mouth 3 (three) times daily as needed for anxiety. 90 tablet 0  . cyclobenzaprine (FLEXERIL) 10 MG tablet Take 1 tablet (10 mg total) by mouth 3 (three) times daily as needed for muscle spasms (watch out for sedation). 30 tablet 0  . dicyclomine (BENTYL) 10  MG capsule Take 1 tab twice daily as needed for cramping and abdominal pain. 60 capsule 4  . estrogens, conjugated, (PREMARIN) 0.3 MG tablet Take 1 tablet (0.3 mg total) by mouth daily. Take daily for 21 days then do not take for 7 days. 30 tablet 3  . fluticasone (FLONASE) 50 MCG/ACT nasal spray Place 2 sprays into both nostrils daily. 16 g 6  . meloxicam (MOBIC) 15 MG tablet Take 1 tablet (15 mg total) by mouth daily. Take with food for low back pain 30 tablet 1  . metFORMIN (GLUCOPHAGE) 500 MG tablet Take 1 tablet (500 mg total) by mouth daily with breakfast. 90 tablet 3  . omeprazole (PRILOSEC) 40 MG capsule TAKE 1 CAPSULE DAILY 120 capsule 3  . polyethylene glycol powder (GLYCOLAX/MIRALAX) powder Take 17 g by mouth daily. Until daily soft stools  OTC    . promethazine (PHENERGAN) 25 MG tablet Take 1 tab by mouth every 6 hours as needed for nausea 90 tablet 3  . rizatriptan (MAXALT-MLT) 10 MG disintegrating tablet Take 1 tablet (10 mg total) by mouth as needed for migraine. May repeat in 2 hours if needed. Do not take more than 3 tablets in a week. 10 tablet 3  . traZODone (DESYREL) 150  MG tablet Take 1 tablet (150 mg total) by mouth at bedtime. 90 tablet 0  . verapamil (CALAN) 40 MG tablet Take 1 tablet daily 30 tablet 4   No current facility-administered medications for this visit.    Allergies  Allergen Reactions  . Penicillins Anaphylaxis    REACTION: Throat swells  . Topamax [Topiramate]     Hands and feet to go numb    Family History  Problem Relation Age of Onset  . Diabetes Mother   . Hyperlipidemia Mother   . Hypertension Mother   . Colon cancer Neg Hx   . Rectal cancer Neg Hx   . Stomach cancer Neg Hx   . Prostate cancer Father   . Lung cancer Maternal Grandfather     History   Social History  . Marital Status: Married    Spouse Name: N/A  . Number of Children: 6  . Years of Education: N/A   Occupational History  .       helps run a friends business    Social History Main Topics  . Smoking status: Former Smoker -- 1.00 packs/day for 35 years    Types: Cigarettes, E-cigarettes    Quit date: 02/02/2014  . Smokeless tobacco: Never Used  . Alcohol Use: No  . Drug Use: No  . Sexual Activity: Yes    Birth Control/ Protection: Pill   Other Topics Concern  . Not on file   Social History Narrative   5 living children, one child is a crack cocaine addict who often breaks into their house to steal money. One child died of cerebral palsy.     Constitutional: Pt reports headache. Denies fever, malaise, fatigue, or abrupt weight changes.  HEENT: Pt reports sensitivity to light. Denies eye pain, eye redness, ear pain, ringing in the ears, wax buildup, runny nose, nasal congestion, bloody nose, or sore throat. Respiratory: Denies difficulty breathing, shortness of breath, cough or sputum production.   Cardiovascular: Denies chest pain, chest tightness, palpitations or swelling in the hands or feet.  Gastrointestinal: Pt reports nausea. Denies abdominal pain, bloating, constipation, diarrhea or blood in the stool.  Neurological: Denies dizziness, difficulty with memory, difficulty with speech or problems with balance and coordination.   No other specific complaints in a complete review of systems (except as listed in HPI above).  Objective:   Physical Exam   BP 134/78 mmHg  Pulse 71  Temp(Src) 98.3 F (36.8 C) (Oral)  Wt 147 lb 12 oz (67.019 kg)  SpO2 97%  LMP 02/12/2014 Wt Readings from Last 3 Encounters:  04/13/15 147 lb 12 oz (67.019 kg)  04/12/15 148 lb 8 oz (67.359 kg)  03/30/15 147 lb 12 oz (67.019 kg)    General: Appears their stated age, well developed, well nourished in NAD. Skin: Warm, dry and intact. No rashes, lesions or ulcerations noted. HEENT: Head: normal shape and size; Eyes: sclera white, no icterus, conjunctiva pink, PERRLA and EOMs intact;  Cardiovascular: Normal rate and rhythm. S1,S2 noted.  No murmur, rubs  or gallops noted.. Pulmonary/Chest: Normal effort and positive vesicular breath sounds. No respiratory distress. No wheezes, rales or ronchi noted.  Abdomen: Soft and nontender. Normal bowel sounds, no bruits noted. No distention or masses noted.  Neurological: Alert and oriented.  Coordination normal.   BMET    Component Value Date/Time   NA 138 04/07/2015 0843   K 4.3 04/07/2015 0843   CL 103 04/07/2015 0843   CO2 29 04/07/2015 0843   GLUCOSE  106* 04/07/2015 0843   BUN 10 04/07/2015 0843   CREATININE 0.76 04/07/2015 0843   CREATININE 0.77 10/29/2014 1317   CALCIUM 9.8 04/07/2015 0843   GFRNONAA >90 06/03/2014 0954   GFRAA >90 06/03/2014 0954    Lipid Panel     Component Value Date/Time   CHOL 212* 04/07/2015 0843   TRIG 68.0 04/07/2015 0843   HDL 48.30 04/07/2015 0843   CHOLHDL 4 04/07/2015 0843   VLDL 13.6 04/07/2015 0843   LDLCALC 150* 04/07/2015 0843    CBC    Component Value Date/Time   WBC 6.2 04/07/2015 0843   RBC 4.17 04/07/2015 0843   HGB 13.3 04/07/2015 0843   HCT 38.5 04/07/2015 0843   PLT 418.0* 04/07/2015 0843   MCV 92.2 04/07/2015 0843   MCH 31.4 06/03/2014 0954   MCHC 34.6 04/07/2015 0843   RDW 13.9 04/07/2015 0843   LYMPHSABS 2.5 04/07/2015 0843   MONOABS 0.5 04/07/2015 0843   EOSABS 0.2 04/07/2015 0843   BASOSABS 0.0 04/07/2015 0843    Hgb A1C Lab Results  Component Value Date   HGBA1C 5.7 04/07/2015        Assessment & Plan:   Migraine:  Last neurology note reviewed Continue Verapamil, Amitriptyline and Maxalt as needed Toradol 30 mg IM today Phenergan 12.5 mg IM today- she has someone to drive her home Push fluids and try to go home and sleep it off If persists or worse, call Dr. Delice Lesch  RTC as needed or if symptoms persist or worsen

## 2015-04-13 NOTE — Progress Notes (Signed)
Pre visit review using our clinic review tool, if applicable. No additional management support is needed unless otherwise documented below in the visit note. 

## 2015-04-14 ENCOUNTER — Encounter: Payer: Self-pay | Admitting: *Deleted

## 2015-04-25 ENCOUNTER — Emergency Department (HOSPITAL_COMMUNITY)
Admission: EM | Admit: 2015-04-25 | Discharge: 2015-04-25 | Disposition: A | Discharge status: Home / Self Care | Payer: No Typology Code available for payment source | Attending: Emergency Medicine | Admitting: Emergency Medicine

## 2015-04-25 ENCOUNTER — Encounter (HOSPITAL_COMMUNITY): Payer: Self-pay | Admitting: *Deleted

## 2015-04-25 ENCOUNTER — Emergency Department (HOSPITAL_COMMUNITY): Payer: No Typology Code available for payment source

## 2015-04-25 ENCOUNTER — Telehealth: Payer: Self-pay | Admitting: Family Medicine

## 2015-04-25 DIAGNOSIS — Z79899 Other long term (current) drug therapy: Secondary | ICD-10-CM | POA: Insufficient documentation

## 2015-04-25 DIAGNOSIS — E119 Type 2 diabetes mellitus without complications: Secondary | ICD-10-CM | POA: Diagnosis not present

## 2015-04-25 DIAGNOSIS — Z7951 Long term (current) use of inhaled steroids: Secondary | ICD-10-CM | POA: Insufficient documentation

## 2015-04-25 DIAGNOSIS — R079 Chest pain, unspecified: Secondary | ICD-10-CM | POA: Diagnosis present

## 2015-04-25 DIAGNOSIS — K219 Gastro-esophageal reflux disease without esophagitis: Secondary | ICD-10-CM | POA: Diagnosis not present

## 2015-04-25 DIAGNOSIS — F419 Anxiety disorder, unspecified: Secondary | ICD-10-CM | POA: Diagnosis not present

## 2015-04-25 DIAGNOSIS — Z793 Long term (current) use of hormonal contraceptives: Secondary | ICD-10-CM | POA: Insufficient documentation

## 2015-04-25 DIAGNOSIS — R0789 Other chest pain: Secondary | ICD-10-CM | POA: Diagnosis not present

## 2015-04-25 DIAGNOSIS — Z880 Allergy status to penicillin: Secondary | ICD-10-CM | POA: Diagnosis not present

## 2015-04-25 DIAGNOSIS — H269 Unspecified cataract: Secondary | ICD-10-CM | POA: Insufficient documentation

## 2015-04-25 DIAGNOSIS — G8929 Other chronic pain: Secondary | ICD-10-CM | POA: Insufficient documentation

## 2015-04-25 DIAGNOSIS — Z8781 Personal history of (healed) traumatic fracture: Secondary | ICD-10-CM | POA: Insufficient documentation

## 2015-04-25 DIAGNOSIS — R200 Anesthesia of skin: Secondary | ICD-10-CM | POA: Insufficient documentation

## 2015-04-25 LAB — COMPREHENSIVE METABOLIC PANEL
ALBUMIN: 3.7 g/dL (ref 3.5–5.0)
ALK PHOS: 87 U/L (ref 38–126)
ALT: 16 U/L (ref 14–54)
ANION GAP: 9 (ref 5–15)
AST: 19 U/L (ref 15–41)
BILIRUBIN TOTAL: 0.3 mg/dL (ref 0.3–1.2)
BUN: 16 mg/dL (ref 6–20)
CALCIUM: 9.3 mg/dL (ref 8.9–10.3)
CHLORIDE: 106 mmol/L (ref 101–111)
CO2: 26 mmol/L (ref 22–32)
CREATININE: 0.77 mg/dL (ref 0.44–1.00)
GFR calc non Af Amer: >60 mL/min (ref >60)
GLUCOSE: 121 mg/dL — AB (ref 70–99)
Potassium: 3.7 mmol/L (ref 3.5–5.1)
Sodium: 141 mmol/L (ref 135–145)
Total Protein: 6.7 g/dL (ref 6.5–8.1)

## 2015-04-25 LAB — CBC
HCT: 36.2 % (ref 36.0–46.0)
HEMOGLOBIN: 12.2 g/dL (ref 12.0–15.0)
MCH: 31.5 pg (ref 26.0–34.0)
MCHC: 33.7 g/dL (ref 30.0–36.0)
MCV: 93.5 fL (ref 78.0–100.0)
Platelets: 374 10*3/uL (ref 150–400)
RBC: 3.87 MIL/uL (ref 3.87–5.11)
RDW: 13.9 % (ref 11.5–15.5)
WBC: 8.4 10*3/uL (ref 4.0–10.5)

## 2015-04-25 LAB — I-STAT TROPONIN, ED: Troponin i, poc: 0 ng/mL (ref 0.00–0.08)

## 2015-04-25 MED ORDER — IBUPROFEN 800 MG PO TABS: 800.0000 mg | ORAL_TABLET | Freq: Three times a day (TID) | ORAL | Status: DC | PRN

## 2015-04-25 MED ORDER — HYDROCODONE-ACETAMINOPHEN 5-325 MG PO TABS: 1.0000 | ORAL_TABLET | Freq: Four times a day (QID) | ORAL | Status: DC | PRN

## 2015-04-25 MED ORDER — OXYCODONE-ACETAMINOPHEN 5-325 MG PO TABS
1.0000 | ORAL_TABLET | Freq: Once | ORAL | Status: AC
  Administered 2015-04-25: 1 via ORAL
  Filled 2015-04-25: qty 1

## 2015-04-25 NOTE — Discharge Instructions (Signed)
Return here as needed.  Follow-up with your primary care doctor °

## 2015-04-25 NOTE — ED Provider Notes (Signed)
CSN: [id number]     Arrival date & time 04/25/15  1012 History   First MD Initiated Contact with Patient 04/25/15 1354     Chief Complaint  Patient presents with  . Chest Pain  . Numbness     (Consider location/radiation/quality/duration/timing/severity/associated sxs/prior Treatment) HPI Patient presents to the emergency department with chest pain that started early this morning and has been constant since that time.  The patient states that she had hyperventilation, numbness, tingling in her extremities.  The patient states that she has had a significant history of panic attacks, but this one felt worse than she is ever experienced, so that is why she came to the hospital.  The patient denies shortness of breath, nausea, vomiting, weakness, dizziness, headache, blurred vision, cough, runny nose, sore throat.  Nothing seems make her condition better or worse.  The patient states that she also felt lightheaded when she was having the hyperventilation Past Medical History  Diagnosis Date  . Anxiety   . Tobacco abuse   . Cataract     bilateral  . SVD (spontaneous vaginal delivery)     x 3  . Bronchitis     uses inhaler if needed for bronchitis, lasted used -2014  . GERD (gastroesophageal reflux disease)   . History of blood transfusion 07-11[birth year]66    at birth in [place], [place], unsure number of units  . Diabetes mellitus without complication     taken off Metformin since 04/2014, HgbA1C - normal, will follow up with PCP- Dr. Deborra Medina, 07/2014  . H/O exercise stress test 2011    done at [facility]- told that it was WNL, done due to pt. having panic attacks   . Chronic headaches     migraines - in past, uses Phenergan for nausea   . Poor dentition     very poor oral health    Past Surgical History  Procedure Laterality Date  . Appendectomy  1988  . Tubal ligation    . Esophagogastroduodenoscopy  12/28/2013  . Knee arthroscopy  1995    left  . Colonoscopy  02/16/2014    normal   .  Laparoscopic assisted vaginal hysterectomy N/A 04/15/2014    Procedure: LAPAROSCOPIC ASSISTED VAGINAL HYSTERECTOMY;  Surgeon: Marylynn Pearson, MD;  Location: [facility];  Service: Gynecology;  Laterality: N/A;  . Laparoscopic bilateral salpingo oopherectomy Bilateral 04/15/2014    Procedure: LAPAROSCOPIC BILATERAL SALPINGO OOPHORECTOMY;  Surgeon: Marylynn Pearson, MD;  Location: [facility];  Service: Gynecology;  Laterality: Bilateral;  . Abdominal hysterectomy    . Cholecystectomy N/A 06/07/2014    Procedure: LAPAROSCOPIC CHOLECYSTECTOMY;  Surgeon: Ralene Ok, MD;  Location: [facility];  Service: General;  Laterality: N/A;  . Cholecystectomy  June 07 2014   Family History  Problem Relation Age of Onset  . Diabetes Mother   . Hyperlipidemia Mother   . Hypertension Mother   . Colon cancer Neg Hx   . Rectal cancer Neg Hx   . Stomach cancer Neg Hx   . Prostate cancer Father   . Lung cancer Maternal Grandfather    History  Substance Use Topics  . Smoking status: Former Smoker -- 1.00 packs/day for 35 years    Types: Cigarettes, E-cigarettes    Quit date: 02/02/2014  . Smokeless tobacco: Never Used  . Alcohol Use: No   OB History    No data available     Review of Systems  All other systems negative except as documented in the HPI. All pertinent positives and  negatives as reviewed in the HPI.  Allergies  Penicillins and Topamax  Home Medications   Prior to Admission medications   Medication Sig Start Date End Date Taking? Authorizing Provider  albuterol (PROVENTIL HFA;VENTOLIN HFA) 108 (90 BASE) MCG/ACT inhaler Inhale 2 puffs into the lungs every 6 (six) hours as needed. 03/31/15  Yes Lucille Passy, MD  amitriptyline (ELAVIL) 25 MG tablet Take 1-1/2 tablet at dinner time Patient taking differently: Take 25-37.5 mg by mouth daily with supper.  01/04/15  Yes Cameron Sprang, MD  clonazePAM (KLONOPIN) 1 MG tablet Take 1 tablet (1 mg total) by mouth 3 (three) times daily as needed for anxiety.  04/12/15  Yes Lucille Passy, MD  cyclobenzaprine (FLEXERIL) 10 MG tablet Take 1 tablet (10 mg total) by mouth 3 (three) times daily as needed for muscle spasms (watch out for sedation). 03/14/15  Yes Abner Greenspan, MD  dicyclomine (BENTYL) 10 MG capsule Take 1 tab twice daily as needed for cramping and abdominal pain. 02/04/15  Yes Jearld Fenton, NP  estrogens, conjugated, (PREMARIN) 0.3 MG tablet Take 1 tablet (0.3 mg total) by mouth daily. Take daily for 21 days then do not take for 7 days. 04/12/15  Yes Lucille Passy, MD  fluticasone (FLONASE) 50 MCG/ACT nasal spray Place 2 sprays into both nostrils daily. 03/31/15  Yes Lucille Passy, MD  meloxicam (MOBIC) 15 MG tablet Take 1 tablet (15 mg total) by mouth daily. Take with food for low back pain 03/14/15  Yes Abner Greenspan, MD  metFORMIN (GLUCOPHAGE) 500 MG tablet Take 1 tablet (500 mg total) by mouth daily with breakfast. 08/11/14  Yes Lucille Passy, MD  omeprazole (PRILOSEC) 40 MG capsule TAKE 1 CAPSULE DAILY 03/15/15  Yes Lucille Passy, MD  polyethylene glycol powder (GLYCOLAX/MIRALAX) powder Take 17 g by mouth daily. Until daily soft stools  OTC 12/10/13  Yes Jennifer Piepenbrink, PA-C  rizatriptan (MAXALT-MLT) 10 MG disintegrating tablet Take 1 tablet (10 mg total) by mouth as needed for migraine. May repeat in 2 hours if needed. Do not take more than 3 tablets in a week. 01/24/15  Yes Cameron Sprang, MD  traZODone (DESYREL) 150 MG tablet Take 1 tablet (150 mg total) by mouth at bedtime. 11/05/14  Yes Lucille Passy, MD  verapamil (CALAN) 40 MG tablet Take 1 tablet daily 01/04/15  Yes Cameron Sprang, MD   BP 130/57 mmHg  Pulse 70  Temp(Src) 98.1 F (36.7 C) (Oral)  Resp 17  Ht 5\' 2"  (1.575 m)  Wt 152 lb (68.947 kg)  BMI 27.79 kg/m2  SpO2 99%  LMP 02/12/2014 Physical Exam  Constitutional: She is oriented to person, place, and time. She appears well-developed and well-nourished. No distress.  HENT:  Head: Normocephalic and atraumatic.  Mouth/Throat:  Oropharynx is clear and moist.  Eyes: Pupils are equal, round, and reactive to light.  Neck: Normal range of motion. Neck supple.  Cardiovascular: Normal rate, regular rhythm and normal heart sounds.  Exam reveals no gallop and no friction rub.   No murmur heard. Pulmonary/Chest: Effort normal and breath sounds normal. No respiratory distress.  Musculoskeletal: She exhibits no edema.  Neurological: She is alert and oriented to person, place, and time. She has normal reflexes. She exhibits normal muscle tone. Coordination normal.  Skin: Skin is warm and dry. No rash noted. No erythema.  Nursing note and vitals reviewed.   ED Course  Procedures (including critical care time) Labs Review Labs  Reviewed  COMPREHENSIVE METABOLIC PANEL - Abnormal; Notable for the following:    Glucose, Bld 121 (*)    All other components within normal limits  CBC  I-STAT TROPOININ, ED    Imaging Review Dg Chest 2 View  04/25/2015   CLINICAL DATA:  Midsternal left-sided chest pain for 1 day. Difficulty breathing  EXAM: CHEST  2 VIEW  COMPARISON:  July 02, 2010  FINDINGS: There is mild atelectatic change in the lingula. Lungs elsewhere clear. Heart size and pulmonary vascularity are normal. No adenopathy. No pneumothorax. No bone lesions.  IMPRESSION: Mild lingular atelectasis.  No edema or consolidation.   Electronically Signed   By: Lowella Grip III M.D.   On: 04/25/2015 10:50     EKG Interpretation   Date/Time:  Monday Apr 25 2015 10:18:05 EDT Ventricular Rate:  75 PR Interval:  146 QRS Duration: 76 QT Interval:  394 QTC Calculation: 439 R Axis:   42 Text Interpretation:  Normal sinus rhythm Nonspecific ST and T wave  abnormality Abnormal ECG no significant change since July 2011 Confirmed  by Regenia Skeeter  MD, SCOTT [phone]) on 04/25/2015 1:55:55 PM     The patient has atypical chest pain for cardiac etiology.  She is PERC negative.  Patient has been stable here in the emergency department.  Patient  be referred back to her primary care Dr. told to return here as needed. MDM   Final diagnoses:  None     The patient is complaining of tingling in her hands and legs since the incident.  The patient has been ambulating without difficulty, does not have any focal neurological deficits.  Dalia Heading, PA-C 04/26/15 [id number]  Sherwood Gambler, MD 04/29/15 [phone]

## 2015-04-25 NOTE — Telephone Encounter (Signed)
Please call to check on pt tomorrow. 

## 2015-04-25 NOTE — ED Notes (Addendum)
Pt with hx of anxiety to ED c/o sternal chest pain that radiates to back, sob and leg numbness.  Pt hyperventilating.  Pt states she can take up to 3 klonopin per day, but usu only needs to take 1.

## 2015-04-25 NOTE — Telephone Encounter (Signed)
Patient Name: Ashley [name] DOB: 1966/12/24 Initial Comment Caller states her chest is hurting, sweating, and some trouble breathing. Nurse Assessment Nurse: Vallery Sa, RN, Cathy Date/Time (Eastern Time): 04/25/2015 9:25:21 AM Confirm and document reason for call. If symptomatic, describe symptoms. ---Caller states she developed some breathing difficulty and chest pain about an hour ago. No injury in the past 3 days. Has the patient traveled out of the country within the last 30 days? ---No Does the patient require triage? ---Yes Related visit to physician within the last 2 weeks? ---No Does the PT have any chronic conditions? (i.e. diabetes, asthma, etc.) ---Yes List chronic conditions. ---P{re-Diabetes, High Cholesterol and Migraines Did the patient indicate they were pregnant? ---No Guidelines Guideline Title Affirmed Question Affirmed Notes Chest Pain [1] Chest pain lasts > 5 minutes AND [2] age > 55 AND [3] at least one cardiac risk factor (i.e., hypertension, diabetes, obesity, smoker or strong family history of heart disease) Final Disposition User Call EMS 911 Now Crawford, RN, Kaitland Lewellyn shares that the paramedics have arrived.

## 2015-04-26 NOTE — Telephone Encounter (Signed)
That's great.  If she is feeling better, not necessary to follow up unless she has concerns.

## 2015-04-26 NOTE — Telephone Encounter (Signed)
Spoke to pt who states that she is "feeling a lot better." She was Dx with having a panic attack, but states it was triggered by using her inhaler. Pt is wanting to know if she is needing to f/u.

## 2015-04-26 NOTE — Telephone Encounter (Signed)
Spoke to pt and advised 

## 2015-05-02 ENCOUNTER — Ambulatory Visit
Admission: RE | Admit: 2015-05-02 | Discharge: 2015-05-02 | Disposition: A | Discharge status: Home / Self Care | Payer: No Typology Code available for payment source | Source: Ambulatory Visit | Attending: Family Medicine | Admitting: Family Medicine

## 2015-05-02 DIAGNOSIS — Z1239 Encounter for other screening for malignant neoplasm of breast: Secondary | ICD-10-CM

## 2015-05-13 ENCOUNTER — Other Ambulatory Visit: Payer: Self-pay

## 2015-05-13 MED ORDER — MELOXICAM 15 MG PO TABS: 15.0000 mg | ORAL_TABLET | Freq: Every day | ORAL | Status: DC

## 2015-05-13 NOTE — Telephone Encounter (Signed)
Pt said meloxicam is helping back and neck pain and pt request refill to [facility]. Pt request cb when refilled. Pt had annual on 04/12/15. Pt will be out of med before 05/16/15 and request refill done today.

## 2015-05-24 ENCOUNTER — Other Ambulatory Visit: Payer: Self-pay

## 2015-05-24 MED ORDER — CLONAZEPAM 1 MG PO TABS: 1.0000 mg | ORAL_TABLET | Freq: Three times a day (TID) | ORAL | Status: DC | PRN

## 2015-05-24 NOTE — Telephone Encounter (Signed)
Rx called in to requested pharmacy 

## 2015-05-24 NOTE — Telephone Encounter (Signed)
Pt left v/m requesting refill clonazepam to [facility]; pt is out of med; pt last seen and rx last filled # 90 on 04/12/15.

## 2015-05-25 ENCOUNTER — Encounter: Payer: Self-pay | Admitting: Family Medicine

## 2015-05-25 ENCOUNTER — Ambulatory Visit (INDEPENDENT_AMBULATORY_CARE_PROVIDER_SITE_OTHER): Payer: No Typology Code available for payment source | Admitting: Family Medicine

## 2015-05-25 VITALS — BP 142/84 | HR 80 | Temp 98.2°F | Wt 153.5 lb

## 2015-05-25 DIAGNOSIS — M19012 Primary osteoarthritis, left shoulder: Secondary | ICD-10-CM

## 2015-05-25 MED ORDER — PREDNISONE 20 MG PO TABS: ORAL_TABLET | ORAL | Status: DC

## 2015-05-25 NOTE — Progress Notes (Signed)
Pre visit review using our clinic review tool, if applicable. No additional management support is needed unless otherwise documented below in the visit note. 

## 2015-05-25 NOTE — Progress Notes (Signed)
SUBJECTIVE: Ashley [name] is a 49 y.o. female who complains of left shoulder pain 4 day(s) ago. No known injury.  Fingers hurt too.  "I think my arthritis is flaring."  Has had to receive cortisone injections in that shoulder previously. No UE weakness.  Current Outpatient Prescriptions on File Prior to Visit  Medication Sig Dispense Refill  . albuterol (PROVENTIL HFA;VENTOLIN HFA) 108 (90 BASE) MCG/ACT inhaler Inhale 2 puffs into the lungs every 6 (six) hours as needed. 1 Inhaler 0  . amitriptyline (ELAVIL) 25 MG tablet Take 1-1/2 tablet at dinner time (Patient taking differently: Take 25-37.5 mg by mouth daily with supper. ) 45 tablet 3  . clonazePAM (KLONOPIN) 1 MG tablet Take 1 tablet (1 mg total) by mouth 3 (three) times daily as needed for anxiety. 90 tablet 0  . cyclobenzaprine (FLEXERIL) 10 MG tablet Take 1 tablet (10 mg total) by mouth 3 (three) times daily as needed for muscle spasms (watch out for sedation). 30 tablet 0  . dicyclomine (BENTYL) 10 MG capsule Take 1 tab twice daily as needed for cramping and abdominal pain. 60 capsule 4  . estrogens, conjugated, (PREMARIN) 0.3 MG tablet Take 1 tablet (0.3 mg total) by mouth daily. Take daily for 21 days then do not take for 7 days. 30 tablet 3  . fluticasone (FLONASE) 50 MCG/ACT nasal spray Place 2 sprays into both nostrils daily. 16 g 6  . meloxicam (MOBIC) 15 MG tablet Take 1 tablet (15 mg total) by mouth daily. Take with food for low back pain 30 tablet 1  . metFORMIN (GLUCOPHAGE) 500 MG tablet Take 1 tablet (500 mg total) by mouth daily with breakfast. 90 tablet 3  . omeprazole (PRILOSEC) 40 MG capsule TAKE 1 CAPSULE DAILY 120 capsule 3  . polyethylene glycol powder (GLYCOLAX/MIRALAX) powder Take 17 g by mouth daily. Until daily soft stools  OTC    . rizatriptan (MAXALT-MLT) 10 MG disintegrating tablet Take 1 tablet (10 mg total) by mouth as needed for migraine. May repeat in 2 hours if needed. Do not take more than 3 tablets in a  week. 10 tablet 3  . traZODone (DESYREL) 150 MG tablet Take 1 tablet (150 mg total) by mouth at bedtime. 90 tablet 0  . verapamil (CALAN) 40 MG tablet Take 1 tablet daily 30 tablet 4   No current facility-administered medications on file prior to visit.    Allergies  Allergen Reactions  . Penicillins Anaphylaxis    REACTION: Throat swells  . Topamax [Topiramate]     Hands and feet to go numb    Past Medical History  Diagnosis Date  . Anxiety   . Tobacco abuse   . Cataract     bilateral  . SVD (spontaneous vaginal delivery)     x 3  . Bronchitis     uses inhaler if needed for bronchitis, lasted used -2014  . GERD (gastroesophageal reflux disease)   . History of blood transfusion 1966-02-02    at birth in [place], [place], unsure number of units  . Diabetes mellitus without complication     taken off Metformin since 04/2014, HgbA1C - normal, will follow up with PCP- Dr. Deborra Medina, 07/2014  . H/O exercise stress test 2011    done at [facility]- told that it was WNL, done due to pt. having panic attacks   . Chronic headaches     migraines - in past, uses Phenergan for nausea   . Poor dentition  very poor oral health     Past Surgical History  Procedure Laterality Date  . Appendectomy  1988  . Tubal ligation    . Esophagogastroduodenoscopy  12/28/2013  . Knee arthroscopy  1995    left  . Colonoscopy  02/16/2014    normal   . Laparoscopic assisted vaginal hysterectomy N/A 04/15/2014    Procedure: LAPAROSCOPIC ASSISTED VAGINAL HYSTERECTOMY;  Surgeon: Marylynn Pearson, MD;  Location: [facility];  Service: Gynecology;  Laterality: N/A;  . Laparoscopic bilateral salpingo oopherectomy Bilateral 04/15/2014    Procedure: LAPAROSCOPIC BILATERAL SALPINGO OOPHORECTOMY;  Surgeon: Marylynn Pearson, MD;  Location: [facility];  Service: Gynecology;  Laterality: Bilateral;  . Abdominal hysterectomy    . Cholecystectomy N/A 06/07/2014    Procedure: LAPAROSCOPIC CHOLECYSTECTOMY;  Surgeon: Ralene Ok, MD;   Location: [facility];  Service: General;  Laterality: N/A;  . Cholecystectomy  June 07 2014    Family History  Problem Relation Age of Onset  . Diabetes Mother   . Hyperlipidemia Mother   . Hypertension Mother   . Colon cancer Neg Hx   . Rectal cancer Neg Hx   . Stomach cancer Neg Hx   . Prostate cancer Father   . Lung cancer Maternal Grandfather     History   Social History  . Marital Status: Married    Spouse Name: N/A  . Number of Children: 6  . Years of Education: N/A   Occupational History  .       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 Smoking status: Former Smoker -- 1.00 packs/day for 35 years    Types: Cigarettes, E-cigarettes    Quit date: 02/02/2014  . Smokeless tobacco: Never Used  . Alcohol Use: No  . Drug Use: No  . Sexual Activity: Yes    Birth Control/ Protection: Pill   Other Topics Concern  . Not on file   Social History Narrative   5 living children, one child is a crack cocaine addict who often breaks into their house to steal money. One child died of cerebral palsy.   The PMH, PSH, Social History, Family History, Medications, and allergies have been reviewed in [facility], and have been updated if relevant.  OBJECTIVE: BP 142/84 mmHg  Pulse 80  Temp(Src) 98.2 F (36.8 C) (Oral)  Wt 153 lb 8 oz (69.627 kg)  SpO2 97%  LMP 02/12/2014  Vital signs as noted above. Appearance: alert, well appearing, and in no distress. Shoulder exam: bicipital groove tenderness, positive impingement signs. X-ray: not indicated.  ASSESSMENT: Shoulder internal derangement  PLAN: rest the injured area as much as practical Course of prednisone- advised to take in the morning, with food and avoid NSAIDs.  May need another cortisone injection. The patient indicates understanding of these issues and agrees with the plan. Call or return to clinic prn if these symptoms worsen or fail to improve as anticipated.

## 2015-05-25 NOTE — Patient Instructions (Signed)
Good to see you. Please take prednisone as directed, Tylenol as needed. Please keep me updated.

## 2015-06-06 ENCOUNTER — Other Ambulatory Visit: Payer: Self-pay

## 2015-06-06 DIAGNOSIS — R519 Headache, unspecified: Secondary | ICD-10-CM

## 2015-06-06 DIAGNOSIS — R51 Headache: Secondary | ICD-10-CM

## 2015-06-06 DIAGNOSIS — G44011 Episodic cluster headache, intractable: Secondary | ICD-10-CM

## 2015-06-06 MED ORDER — VERAPAMIL HCL 40 MG PO TABS: ORAL_TABLET | ORAL | Status: DC

## 2015-06-06 MED ORDER — AMITRIPTYLINE HCL 25 MG PO TABS: ORAL_TABLET | ORAL | Status: DC

## 2015-06-06 NOTE — Telephone Encounter (Signed)
Pt left v/m requesting refill verapamil (done) and elavil to [facility] per 04/12/15 instructions. Please advise.

## 2015-06-21 ENCOUNTER — Telehealth: Payer: Self-pay

## 2015-06-21 MED ORDER — MELOXICAM 15 MG PO TABS: 15.0000 mg | ORAL_TABLET | Freq: Every day | ORAL | Status: DC

## 2015-06-21 NOTE — Telephone Encounter (Signed)
Prednisone is not meant to be chronically used like this.  I would suggest meloxicam and then seeing Dr.Copland for injections if he feels this is the right tx option.  eRx sent for Meloxicam.

## 2015-06-21 NOTE — Telephone Encounter (Signed)
Pt left v/m; pt was seen on 05/25/15 with arthritis pain and prednisone helped relieve the pain. The last couple of days arthritis pain is returning and pt wants to know if needs to get refill on prednisone, or should pt take meloxicam or should pt get cortisone shot; pt would like to refill prednisone if possible. Pt request cb.

## 2015-06-22 NOTE — Telephone Encounter (Signed)
Spoke to pt and advised per Dr Deborra Medina. Pt verbally expressed understanding; appt sched with Dr Lorelei Pont

## 2015-06-30 ENCOUNTER — Ambulatory Visit (INDEPENDENT_AMBULATORY_CARE_PROVIDER_SITE_OTHER)
Admission: RE | Admit: 2015-06-30 | Discharge: 2015-06-30 | Disposition: A | Discharge status: Home / Self Care | Payer: No Typology Code available for payment source | Source: Ambulatory Visit | Attending: Family Medicine | Admitting: Family Medicine

## 2015-06-30 ENCOUNTER — Encounter: Payer: Self-pay | Admitting: Family Medicine

## 2015-06-30 ENCOUNTER — Ambulatory Visit (INDEPENDENT_AMBULATORY_CARE_PROVIDER_SITE_OTHER): Payer: No Typology Code available for payment source | Admitting: Family Medicine

## 2015-06-30 VITALS — BP 110/60 | HR 73 | Temp 97.9°F | Ht 62.25 in | Wt 154.0 lb

## 2015-06-30 DIAGNOSIS — M7502 Adhesive capsulitis of left shoulder: Secondary | ICD-10-CM | POA: Diagnosis not present

## 2015-06-30 DIAGNOSIS — M25512 Pain in left shoulder: Secondary | ICD-10-CM | POA: Diagnosis not present

## 2015-06-30 MED ORDER — METHYLPREDNISOLONE ACETATE 40 MG/ML IJ SUSP
80.0000 mg | Freq: Once | INTRAMUSCULAR | Status: AC
  Administered 2015-06-30: 80 mg via INTRA_ARTICULAR

## 2015-06-30 NOTE — Progress Notes (Signed)
Pre visit review using our clinic review tool, if applicable. No additional management support is needed unless otherwise documented below in the visit note. 

## 2015-06-30 NOTE — Progress Notes (Signed)
Dr. Frederico Hamman T. Jemario Poitras, MD, [facility] [place], [address] Phone: [phone] Fax: [phone]  06/30/2015  Patient: Ashley [name], MRN: [id number], DOB: December 02, 1966, 49 y.o.  Primary Physician:  Arnette Norris, MD  Chief Complaint: Shoulder Pain  Subjective:   Ashley [name] is a 49 y.o. very pleasant female patient who presents with the following:  Left shoulder pain and loss of motion:   Pleasant patient with a remote history of what she describes as bursitis approximately 8 or 9 years ago who had one what sounds like subacromial injection which provided some relief at that time. She presents today with significant loss of motion in the last 2 or 3 months. She has not had any traumatic injury, or any other prior significant trauma or operative intervention in the affected shoulder. She is not having any numbness or tingling. No significant neck pain currently.  History is significant for well-controlled diabetes mellitus.  Started around in May with some shoulder pain.  8-9 years ago, had some pain in shoulder, thought bursitis.   Lab Results  Component Value Date   HGBA1C 5.7 04/07/2015     Past Medical History, Surgical History, Social History, Family History, Problem List, Medications, and Allergies have been reviewed and updated if relevant.  Patient Active Problem List   Diagnosis Date Noted  . Well woman exam with routine gynecological exam 04/12/2015  . HLD (hyperlipidemia) 04/12/2015  . Post-menopausal atrophic vaginitis 04/12/2015  . Migraine with aura, intractable 08/12/2014  . Chest wall pain 07/23/2014  . RUQ abdominal pain 05/11/2014  . Diarrhea 05/07/2014  . S/P hysterectomy with oophorectomy 04/15/2014  . Abdominal pain, unspecified site 02/04/2014  . Intractable migraine 10/09/2013  . Hyperglycemia 01/26/2013  . Dyspareunia 01/26/2013  . Routine general medical examination at a health care facility  05/24/2011  . Migraine 05/30/2010  . Anxiety state 09/30/2009  . INSOMNIA, CHRONIC 09/30/2009  . ADJUSTMENT DISORDER WITH MIXED FEATURES 09/30/2009  . ELEVATED BLOOD PRESSURE WITHOUT DIAGNOSIS OF HYPERTENSION 09/30/2009    Past Medical History  Diagnosis Date  . Anxiety   . Tobacco abuse   . Cataract     bilateral  . SVD (spontaneous vaginal delivery)     x 3  . Bronchitis     uses inhaler if needed for bronchitis, lasted used -2014  . GERD (gastroesophageal reflux disease)   . History of blood transfusion [birth year] 1966    at birth in [place], [place], unsure number of units  . Diabetes mellitus without complication     taken off Metformin since 04/2014, HgbA1C - normal, will follow up with PCP- Dr. Deborra Medina, 07/2014  . H/O exercise stress test 2011    done at [facility]- told that it was WNL, done due to pt. having panic attacks   . Chronic headaches     migraines - in past, uses Phenergan for nausea   . Poor dentition     very poor oral health     Past Surgical History  Procedure Laterality Date  . Appendectomy  1988  . Tubal ligation    . Esophagogastroduodenoscopy  12/28/2013  . Knee arthroscopy  1995    left  . Colonoscopy  02/16/2014    normal   . Laparoscopic assisted vaginal hysterectomy N/A 04/15/2014    Procedure: LAPAROSCOPIC ASSISTED VAGINAL HYSTERECTOMY;  Surgeon: Marylynn Pearson, MD;  Location: [facility];  Service: Gynecology;  Laterality: N/A;  . Laparoscopic bilateral salpingo oopherectomy  Bilateral 04/15/2014    Procedure: LAPAROSCOPIC BILATERAL SALPINGO OOPHORECTOMY;  Surgeon: Marylynn Pearson, MD;  Location: [facility];  Service: Gynecology;  Laterality: Bilateral;  . Abdominal hysterectomy    . Cholecystectomy N/A 06/07/2014    Procedure: LAPAROSCOPIC CHOLECYSTECTOMY;  Surgeon: Ralene Ok, MD;  Location: [facility];  Service: General;  Laterality: N/A;  . Cholecystectomy  June 07 2014    History   Social History  . Marital Status: Married    Spouse Name: N/A  . Number  of Children: 6  . Years of Education: N/A   Occupational History  .       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 Smoking status: Former Smoker -- 1.00 packs/day for 35 years    Types: Cigarettes, E-cigarettes    Quit date: 02/02/2014  . Smokeless tobacco: Never Used  . Alcohol Use: No  . Drug Use: No  . Sexual Activity: Yes    Birth Control/ Protection: Pill   Other Topics Concern  . Not on file   Social History Narrative   5 living children, one child is a crack cocaine addict who often breaks into their house to steal money. One child died of cerebral palsy.    Family History  Problem Relation Age of Onset  . Diabetes Mother   . Hyperlipidemia Mother   . Hypertension Mother   . Colon cancer Neg Hx   . Rectal cancer Neg Hx   . Stomach cancer Neg Hx   . Prostate cancer Father   . Lung cancer Maternal Grandfather     Allergies  Allergen Reactions  . Penicillins Anaphylaxis    REACTION: Throat swells  . Topamax [Topiramate]     Hands and feet to go numb    Medication list reviewed and updated in full in [facility].  GEN: No fevers, chills. Nontoxic. Primarily MSK c/o today. MSK: Detailed in the HPI GI: tolerating PO intake without difficulty Neuro: No numbness, parasthesias, or tingling associated. Otherwise the pertinent positives of the ROS are noted above.   Objective:   BP 110/60 mmHg  Pulse 73  Temp(Src) 97.9 F (36.6 C) (Oral)  Ht 5' 2.25" (1.581 m)  Wt 154 lb (69.854 kg)  BMI 27.95 kg/m2  LMP 02/12/2014   GEN: WDWN, NAD, Non-toxic, Alert & Oriented x 3 HEENT: Atraumatic, Normocephalic.  Ears and Nose: No external deformity. EXTR: No clubbing/cyanosis/edema NEURO: Normal gait.  PSYCH: Normally interactive. Conversant. Not depressed or anxious appearing.  Calm demeanor.   Shoulder: R and L Inspection: No muscle wasting or winging Ecchymosis/edema: neg  AC joint, scapula, clavicle: NT Cervical spine: NT, full  ROM Spurling's: neg ABNORMAL SIDE TESTED: L UNLESS OTHERWISE NOTED, THE CONTRALATERAL SIDE HAS FULL RANGE OF MOTION. Abduction: 5/5, LIMITED TO 145 DEGREES Flexion: 5/5, LIMITED TO 150 DEGNO ROM  IR, lift-off: 5/5. TESTED AT 90 DEGREES OF ABDUCTION, LIMITED TO 0 DEGREES ER at neutral:  5/5, TESTED AT 90 DEGREES OF ABDUCTION, LIMITED TO 170 DEGREES AC crossover and compression: PAIN Drop Test: neg Empty Can: neg Supraspinatus insertion: NT Bicipital groove: NT ALL OTHER SPECIAL TESTING EQUIVOCAL GIVEN LOSS OF MOTION C5-T1 intact Sensation intact Grip 5/5   Radiology: Dg Shoulder Left  06/30/2015   CLINICAL DATA:  Frozen shoulder. No known injury. Initial evaluation .  EXAM: LEFT SHOULDER - 2+ VIEW  COMPARISON:  None.  FINDINGS: Mild glenohumeral degenerative change. No acute bony abnormality identified. No evidence of fracture dislocation. No evidence  of separation.  IMPRESSION: Mild glenohumeral degenerative change, otherwise negative exam.   Electronically Signed   By: [place]   On: 06/30/2015 11:26     Assessment and Plan:   Adhesive capsulitis of left shoulder  Left shoulder pain - Plan: DG Shoulder Left, methylPREDNISolone acetate (DEPO-MEDROL) injection 80 mg  Patient was given a systematic ROM protocol from [facility] or [facility] to be done daily. Emphasized adherence and recommended formal PT.  Tylenol or NSAID of choice prn for pain relief Will need RTC str and scapular stabilization to fix underlying mechanics.  Intraarticular corticosteroid injections in Adhesive Capsulitis have clearly been shown to be of benefit.   Intrarticular Shoulder Injection, LEFT Verbal consent was obtained from the patient. Risks including infection explained and contrasted with benefits and alternatives. Patient prepped with Chloraprep and Ethyl Chloride used for anesthesia. An intraarticular shoulder injection was performed using the posterior approach. The patient tolerated the procedure  well and had decreased pain post injection. No complications. Injection: 8 cc of Lidocaine 1% and Depo-Medrol 80 mg. Needle: 22 gauge   ROM improved post-injection  Follow-up: 2 mo  Orders Placed This Encounter  Procedures  . DG Shoulder Left    Signed,  Frederico Hamman T. Yuepheng Schaller, MD   Patient's Medications  New Prescriptions   No medications on file  Previous Medications   AMITRIPTYLINE (ELAVIL) 25 MG TABLET    Take 1-1/2 tablet at dinner time   CLONAZEPAM (KLONOPIN) 1 MG TABLET    Take 1 tablet (1 mg total) by mouth 3 (three) times daily as needed for anxiety.   DICYCLOMINE (BENTYL) 10 MG CAPSULE    Take 1 tab twice daily as needed for cramping and abdominal pain.   ESTROGENS, CONJUGATED, (PREMARIN) 0.3 MG TABLET    Take 1 tablet (0.3 mg total) by mouth daily. Take daily for 21 days then do not take for 7 days.   MELOXICAM (MOBIC) 15 MG TABLET    Take 1 tablet (15 mg total) by mouth daily. Take with food for low back pain   METFORMIN (GLUCOPHAGE) 500 MG TABLET    Take 1 tablet (500 mg total) by mouth daily with breakfast.   OMEPRAZOLE (PRILOSEC) 40 MG CAPSULE    TAKE 1 CAPSULE DAILY   POLYETHYLENE GLYCOL POWDER (GLYCOLAX/MIRALAX) POWDER    Take 17 g by mouth daily. Until daily soft stools  OTC   RIZATRIPTAN (MAXALT-MLT) 10 MG DISINTEGRATING TABLET    Take 1 tablet (10 mg total) by mouth as needed for migraine. May repeat in 2 hours if needed. Do not take more than 3 tablets in a week.   TRAZODONE (DESYREL) 150 MG TABLET    Take 1 tablet (150 mg total) by mouth at bedtime.   VERAPAMIL (CALAN) 40 MG TABLET    Take 1 tablet daily  Modified Medications   No medications on file  Discontinued Medications   ALBUTEROL (PROVENTIL HFA;VENTOLIN HFA) 108 (90 BASE) MCG/ACT INHALER    Inhale 2 puffs into the lungs every 6 (six) hours as needed.   CYCLOBENZAPRINE (FLEXERIL) 10 MG TABLET    Take 1 tablet (10 mg total) by mouth 3 (three) times daily as needed for muscle spasms (watch out for  sedation).   FLUTICASONE (FLONASE) 50 MCG/ACT NASAL SPRAY    Place 2 sprays into both nostrils daily.   PREDNISONE (DELTASONE) 20 MG TABLET    3 tabs by mouth every morning and with food for 7 days

## 2015-07-06 ENCOUNTER — Other Ambulatory Visit: Payer: Self-pay | Admitting: Family Medicine

## 2015-07-06 DIAGNOSIS — R739 Hyperglycemia, unspecified: Secondary | ICD-10-CM

## 2015-07-06 DIAGNOSIS — E785 Hyperlipidemia, unspecified: Secondary | ICD-10-CM

## 2015-07-12 ENCOUNTER — Other Ambulatory Visit (INDEPENDENT_AMBULATORY_CARE_PROVIDER_SITE_OTHER): Payer: No Typology Code available for payment source

## 2015-07-12 ENCOUNTER — Other Ambulatory Visit: Payer: Self-pay | Admitting: Family Medicine

## 2015-07-12 ENCOUNTER — Encounter: Payer: Self-pay | Admitting: *Deleted

## 2015-07-12 DIAGNOSIS — E785 Hyperlipidemia, unspecified: Secondary | ICD-10-CM

## 2015-07-12 DIAGNOSIS — R739 Hyperglycemia, unspecified: Secondary | ICD-10-CM | POA: Diagnosis not present

## 2015-07-12 LAB — LIPID PANEL
Cholesterol: 149 mg/dL (ref 0–200)
HDL: 41.1 mg/dL (ref >39.00)
LDL CALC: 70 mg/dL (ref 0–99)
NonHDL: 107.9
TRIGLYCERIDES: 189 mg/dL — AB (ref 0.0–149.0)
Total CHOL/HDL Ratio: 4
VLDL: 37.8 mg/dL (ref 0.0–40.0)

## 2015-07-12 LAB — COMPREHENSIVE METABOLIC PANEL
ALK PHOS: 87 U/L (ref 39–117)
ALT: 13 U/L (ref 0–35)
AST: 13 U/L (ref 0–37)
Albumin: 4.2 g/dL (ref 3.5–5.2)
BILIRUBIN TOTAL: 0.2 mg/dL (ref 0.2–1.2)
BUN: 13 mg/dL (ref 6–23)
CO2: 29 mEq/L (ref 19–32)
CREATININE: 0.79 mg/dL (ref 0.40–1.20)
Calcium: 9.5 mg/dL (ref 8.4–10.5)
Chloride: 103 mEq/L (ref 96–112)
GFR: 82.05 mL/min (ref >60.00)
Glucose, Bld: 108 mg/dL — ABNORMAL HIGH (ref 70–99)
POTASSIUM: 4.2 meq/L (ref 3.5–5.1)
SODIUM: 140 meq/L (ref 135–145)
TOTAL PROTEIN: 7.2 g/dL (ref 6.0–8.3)

## 2015-07-12 LAB — HEMOGLOBIN A1C: Hgb A1c MFr Bld: 6 % (ref 4.6–6.5)

## 2015-07-12 MED ORDER — CLONAZEPAM 1 MG PO TABS: 1.0000 mg | ORAL_TABLET | Freq: Three times a day (TID) | ORAL | Status: DC | PRN

## 2015-07-12 NOTE — Telephone Encounter (Signed)
Last f/u appt 03/2015-CPE 

## 2015-07-12 NOTE — Telephone Encounter (Signed)
Patient would like her ClonazaPam refilled and sent to [facility].

## 2015-07-12 NOTE — Telephone Encounter (Signed)
Rx called in to requested pharmacy 

## 2015-07-18 ENCOUNTER — Other Ambulatory Visit: Payer: Self-pay | Admitting: Family Medicine

## 2015-07-18 NOTE — Telephone Encounter (Signed)
Pt last Rx 10/2014. pls advise

## 2015-07-21 ENCOUNTER — Telehealth: Payer: Self-pay

## 2015-07-21 ENCOUNTER — Telehealth: Payer: Self-pay | Admitting: Family Medicine

## 2015-07-21 ENCOUNTER — Ambulatory Visit (INDEPENDENT_AMBULATORY_CARE_PROVIDER_SITE_OTHER): Payer: No Typology Code available for payment source | Admitting: Internal Medicine

## 2015-07-21 ENCOUNTER — Encounter: Payer: Self-pay | Admitting: Internal Medicine

## 2015-07-21 VITALS — BP 138/76 | HR 86 | Temp 98.2°F | Wt 154.0 lb

## 2015-07-21 DIAGNOSIS — R739 Hyperglycemia, unspecified: Secondary | ICD-10-CM | POA: Diagnosis not present

## 2015-07-21 DIAGNOSIS — R7303 Prediabetes: Secondary | ICD-10-CM

## 2015-07-21 DIAGNOSIS — R7309 Other abnormal glucose: Secondary | ICD-10-CM | POA: Diagnosis not present

## 2015-07-21 NOTE — Telephone Encounter (Signed)
Please call to check on pt. 

## 2015-07-21 NOTE — Telephone Encounter (Signed)
Pt has appt to see Webb Silversmith NP on 07/21/15 at 10:30 AM.

## 2015-07-21 NOTE — Telephone Encounter (Signed)
PLEASE NOTE: All timestamps contained within this report are represented as [place] Standard Time. CONFIDENTIALTY NOTICE: This fax transmission is intended only for the addressee. It contains information that is legally privileged, confidential or otherwise protected from use or disclosure. If you are not the intended recipient, you are strictly prohibited from reviewing, disclosing, copying using or disseminating any of this information or taking any action in reliance on or regarding this information. If you have received this fax in error, please notify us immediately by telephone so that we can arrange for its return to [place]. Phone: [phone], Toll-Free: [phone], Fax: [phone] Page: 1 of 2 Call Id: [id number] [place] Patient Name: Ashley [name] Gender: Female DOB: [birth year] Age: 49 Y 32 M 6 D Return Phone Number: [phone] (Primary) Address: [place] City/State/Zip: [place] [address] Client [place] [facility] - Client Client Site [place] Physician Arnette Norris Contact Type Call Call Type Triage / Clinical Relationship To Patient Self Return Phone Number [phone] (Primary) Chief Complaint Blood Sugar High Initial Comment Caller States her sugar is at 195, has not ate anything since 2:30 PreDisposition Go to Urgent Care/Walk-In Clinic Nurse Assessment Nurse: Solocinski, RN, Beth Date/Time (Eastern Time): 07/20/2015 6:41:17 PM Confirm and document reason for call. If symptomatic, describe symptoms. ---Caller states she states blood sugar has been 245-to around 200 in last ate around 230. Not insulin dependent. Feels hot and nauseated. On metformin daily. Has the patient traveled out of the country within the last 30 days? ---No Does the patient require triage? ---Yes Related visit to physician within the  last 2 weeks? ---No Does the PT have any chronic conditions? (i.e. diabetes, asthma, etc.) ---Yes List chronic conditions. ---diabetic migraines Did the patient indicate they were pregnant? ---No Guidelines Guideline Title Affirmed Question Affirmed Notes Nurse Date/Time (Eastern Time) Diabetes - High Blood Sugar Blood glucose 60-240 mg/dl (3.5 -13 mmol/ l) (all triage questions negative) Solocinski, RN, [facility] 07/20/2015 6:41:35 PM Disp. Time Eilene Ghazi Time) Disposition Final User 07/20/2015 6:52:47 PM Home Care Yes Solocinski, RN, Personal assistant Understands: Yes Disagree/Comply: Comply PLEASE NOTE: All timestamps contained within this report are represented as [place] Standard Time. CONFIDENTIALTY NOTICE: This fax transmission is intended only for the addressee. It contains information that is legally privileged, confidential or otherwise protected from use or disclosure. If you are not the intended recipient, you are strictly prohibited from reviewing, disclosing, copying using or disseminating any of this information or taking any action in reliance on or regarding this information. If you have received this fax in error, please notify us immediately by telephone so that we can arrange for its return to [place]. Phone: [phone], Toll-Free: [phone], Fax: [phone] Page: 2 of 2 Call Id: [id number] Care Advice Given Per Guideline HOME CARE: You should be able to treat this at home. HIGH BLOOD SUGAR (HYPERGLYCEMIA): * Definition: Fasting blood glucose over 140 mg/dL (7.5 mmol/l) or random blood glucose over 200 mg/dL (11 mmol/l). * Symptoms of mild hyperglycemia: Frequent urination, increased thirst, fatigue, blurred vision. * Symptoms of severe hyperglycemia: Confusion and coma. * Contributing factors: Non-compliance with medications, non-compliance with diet, infection GENERAL DIABETES ADVICE: * Physician: See your physician regularly. * Testing: Test your blood glucose - Follow your  physician's advice regarding how often. * Record-keeping: Keep a daily record of how you are feeling and the results of your tests. * Medications:  Take your diabetes medications as prescribed. * Eat healthy: Work with your doctor or a dietician to develop healthy meal plan. EXPECTED COURSE - You should CALL YOUR DOCTOR WITHIN 1-3 DAYS if: * Your blood sugar continues to get above 240 mg/dl (13 mmol/l). * Your blood sugar continues to be higher than the glucose goals your doctor set for you.It has been longer than 6 months since you had an Hemoglobin A1C test. CALL BACK IF: * You become worse. * Glucose over 300 mg/dL (16.5 mmol/ l) two or more times in a row * Exercise: Staying physically active is important. * Eye exam: Get an eye exam once a year (by an ophthalmologist). * Feet: Keep your feet clean and dry; check your feet daily for sores. After Care Instructions Given Call Event Type User Date / Time Description Comments User: Lois Huxley, RN Date/Time Eilene Ghazi Time): 07/20/2015 6:54:48 PM Caller wanted to know if ok to eat since blood sugar was up. Advised yes but to avoid sweets and breads, simple carbs. Advised to eat complex carbs and protein and to drink plenty of water. Nursing judgment.

## 2015-07-21 NOTE — Telephone Encounter (Signed)
Pt has appt to see Webb Silversmith NP 07/21/15 at 10:30.

## 2015-07-21 NOTE — Telephone Encounter (Signed)
Patient Name: Ashley [name] DOB: [birth year] Initial Comment Caller states, has questions about her blood sugar. Yesterday she had 265, today it is 134 fasting. She is taking Rx for this. Nurse Assessment Nurse: Vallery Sa, RN, Cathy Date/Time (Eastern Time): 07/21/2015 8:56:44 AM Confirm and document reason for call. If symptomatic, describe symptoms. ---Caller states her blood sugar was 265 yesterday about 2 hours after eating some ice cream. He blood sugar this morning before breakfast was 134. Alert and responsive. No fever or signs of illness. Has the patient traveled out of the country within the last 30 days? ---No Does the patient require triage? ---Yes Related visit to physician within the last 2 weeks? ---No Does the PT have any chronic conditions? (i.e. diabetes, asthma, etc.) ---Yes List chronic conditions. ---Pre-Diabetes, Migraines Did the patient indicate they were pregnant? ---No Guidelines Guideline Title Affirmed Question Affirmed Notes Diabetes - High Blood Sugar New onset Diabetes suspected (e.g., frequent urination, weakness, weight loss) Final Disposition User See Physician within 24 Hours [place], RN, T[place] Comments Scheduled for 10:30am appointment today with Golden Hurter, NP. Referrals REFERRED TO PCP OFFICE Disagree/Comply: Comply

## 2015-07-21 NOTE — Patient Instructions (Signed)

## 2015-07-21 NOTE — Progress Notes (Signed)
Subjective:    Patient ID: Ashley [name], female    DOB: [birth year], 49 y.o.   MRN: [id number]  HPI  Pt presents to the clinic today with c/o high blood sugar. She does have prediabetes. Her A1C was 6% 719/16. She has been on Metformin 500 mg once daily for about 2 years. Her blood sugars normally range between 100-150. But now she has noticed that they are higher over the last week. Her blood sugar 2 hours after eating ice cream yesterday was 265. She did feel a little nauseated and clammy at that time. Her fasting sugar this morning was 134. She has not had a change in her diet or activity level. She has not started any new medications recently. She did have a cortisone injection in her left shoulder 1 month ago. She has been having some blurred vision. Her last eye exam was 02/2015. She does have numbness and tingling in her hands and feet which only started 1.5 months ago. It is constants. It is not affecting her ability to walk or function. She also reports increased thirst and urination. She is not sure what she needs to do about her increase blood sugars.  Review of Systems      Past Medical History  Diagnosis Date  . Anxiety   . Tobacco abuse   . Cataract     bilateral  . SVD (spontaneous vaginal delivery)     x 3  . Bronchitis     uses inhaler if needed for bronchitis, lasted used -2014  . GERD (gastroesophageal reflux disease)   . History of blood transfusion [birth year] 29, 1966    at birth in [place], [place], unsure number of units  . Diabetes mellitus without complication     taken off Metformin since 04/2014, HgbA1C - normal, will follow up with PCP- Dr. Deborra Medina, 07/2014  . H/O exercise stress test 2011    done at [facility]- told that it was WNL, done due to pt. having panic attacks   . Chronic headaches     migraines - in past, uses Phenergan for nausea   . Poor dentition     very poor oral health     Current Outpatient Prescriptions  Medication Sig Dispense Refill  . amitriptyline  (ELAVIL) 25 MG tablet Take 1-1/2 tablet at dinner time 45 tablet 3  . clonazePAM (KLONOPIN) 1 MG tablet Take 1 tablet (1 mg total) by mouth 3 (three) times daily as needed for anxiety. 90 tablet 0  . dicyclomine (BENTYL) 10 MG capsule Take 1 tab twice daily as needed for cramping and abdominal pain. 60 capsule 4  . estrogens, conjugated, (PREMARIN) 0.3 MG tablet Take 1 tablet (0.3 mg total) by mouth daily. Take daily for 21 days then do not take for 7 days. 30 tablet 3  . meloxicam (MOBIC) 15 MG tablet Take 1 tablet (15 mg total) by mouth daily. Take with food for low back pain 30 tablet 1  . metFORMIN (GLUCOPHAGE) 500 MG tablet TAKE 1 TABLET DAILY WITH BREAKFAST 90 tablet 1  . omeprazole (PRILOSEC) 40 MG capsule TAKE 1 CAPSULE DAILY 120 capsule 3  . polyethylene glycol powder (GLYCOLAX/MIRALAX) powder Take 17 g by mouth daily. Until daily soft stools  OTC    . rizatriptan (MAXALT-MLT) 10 MG disintegrating tablet Take 1 tablet (10 mg total) by mouth as needed for migraine. May repeat in 2 hours if needed. Do not take more than 3 tablets in a week. 10 tablet 3  .  traZODone (DESYREL) 150 MG tablet Take 1 tablet (150 mg total) by mouth at bedtime. 90 tablet 0  . verapamil (CALAN) 40 MG tablet Take 1 tablet daily 30 tablet 4   No current facility-administered medications for this visit.    Allergies  Allergen Reactions  . Penicillins Anaphylaxis    REACTION: Throat swells  . Topamax [Topiramate]     Hands and feet to go numb    Family History  Problem Relation Age of Onset  . Diabetes Mother   . Hyperlipidemia Mother   . Hypertension Mother   . Colon cancer Neg Hx   . Rectal cancer Neg Hx   . Stomach cancer Neg Hx   . Prostate cancer Father   . Lung cancer Maternal Grandfather     History   Social History  . Marital Status: Married    Spouse Name: N/A  . Number of Children: 6  . Years of Education: N/A   Occupational History  .       helps run a friends business   Social  History Main Topics  . Smoking status: Former Smoker -- 1.00 packs/day for 35 years    Types: Cigarettes, E-cigarettes    Quit date: 02/02/2014  . Smokeless tobacco: Never Used  . Alcohol Use: No  . Drug Use: No  . Sexual Activity: Yes    Birth Control/ Protection: Pill   Other Topics Concern  . Not on file   Social History Narrative   5 living children, one child is a crack cocaine addict who often breaks into their house to steal money. One child died of cerebral palsy.     Constitutional: Denies fever, malaise, fatigue, headache or abrupt weight changes.  HEENT: Denies eye pain, eye redness, ear pain, ringing in the ears, wax buildup, runny nose, nasal congestion, bloody nose, or sore throat. Respiratory: Denies difficulty breathing, shortness of breath, cough or sputum production.   Cardiovascular: Denies chest pain, chest tightness, palpitations or swelling in the hands or feet.  Gastrointestinal: Denies abdominal pain, bloating, constipation, diarrhea or blood in the stool.  GU: Pt reports frequency. Denies urgency, pain with urination, burning sensation, blood in urine, odor or discharge. Skin: Denies redness, rashes, lesions or ulcercations.  Neurological: Denies dizziness, difficulty with memory, difficulty with speech or problems with balance and coordination.   No other specific complaints in a complete review of systems (except as listed in HPI above).  Objective:   Physical Exam   BP 138/76 mmHg  Pulse 86  Temp(Src) 98.2 F (36.8 C) (Oral)  Wt 154 lb (69.854 kg)  SpO2 98%  LMP 02/12/2014  Wt Readings from Last 3 Encounters:  06/30/15 154 lb (69.854 kg)  05/25/15 153 lb 8 oz (69.627 kg)  04/25/15 152 lb (68.947 kg)    General: Appears her stated age, well developed, well nourished in NAD. Skin: Warm, dry and intact. No rashes, lesions or ulcerations noted. HEENT: Head: normal shape and size; Eyes: sclera white, no icterus, conjunctiva pink, PERRLA and EOMs  intact;  Cardiovascular: Normal rate and rhythm. S1,S2 noted. Radial and pedal pulses 2+ bilaterally Pulmonary/Chest: Normal effort and positive vesicular breath sounds. No respiratory distress. No wheezes, rales or ronchi noted.  Neurological: Alert and oriented. Sensation intact to BLE.   BMET    Component Value Date/Time   NA 140 07/12/2015 0806   K 4.2 07/12/2015 0806   CL 103 07/12/2015 0806   CO2 29 07/12/2015 0806   GLUCOSE 108* 07/12/2015 4098  BUN 13 07/12/2015 0806   CREATININE 0.79 07/12/2015 0806   CREATININE 0.77 10/29/2014 1317   CALCIUM 9.5 07/12/2015 0806   GFRNONAA >60 04/25/2015 1210   GFRAA >60 04/25/2015 1210    Lipid Panel     Component Value Date/Time   CHOL 149 07/12/2015 0806   TRIG 189.0* 07/12/2015 0806   HDL 41.10 07/12/2015 0806   CHOLHDL 4 07/12/2015 0806   VLDL 37.8 07/12/2015 0806   LDLCALC 70 07/12/2015 0806    CBC    Component Value Date/Time   WBC 8.4 04/25/2015 1210   RBC 3.87 04/25/2015 1210   HGB 12.2 04/25/2015 1210   HCT 36.2 04/25/2015 1210   PLT 374 04/25/2015 1210   MCV 93.5 04/25/2015 1210   MCH 31.5 04/25/2015 1210   MCHC 33.7 04/25/2015 1210   RDW 13.9 04/25/2015 1210   LYMPHSABS 2.5 04/07/2015 0843   MONOABS 0.5 04/07/2015 0843   EOSABS 0.2 04/07/2015 0843   BASOSABS 0.0 04/07/2015 0843    Hgb A1C Lab Results  Component Value Date   HGBA1C 6.0 07/12/2015        Assessment & Plan:   Hyperglycemia associated with prediabetes:  We did go over her diet. It is not c/w a low carb diet. She will start checking her sugars at least twice a day, once fasting and once 2 hours after a meal She will keep a log Advised her to associate foods with high BS readings and cut back on those things that she notices increase her blood sugars Handout on carb given We will recheck her A1C in 3 months She will continue Metformin once daily for now If sugars persist in the 200 range, she will call and let me know and we will  increase Metformin to BID  Make a follow up appt for 3 months

## 2015-07-21 NOTE — Progress Notes (Signed)
Pre visit review using our clinic review tool, if applicable. No additional management support is needed unless otherwise documented below in the visit note. 

## 2015-07-28 ENCOUNTER — Other Ambulatory Visit: Payer: Self-pay | Admitting: Internal Medicine

## 2015-07-29 NOTE — Telephone Encounter (Signed)
Last filled 02/04/15 with 4 refills---last OV with you was an acute visit 05/2015--next appt scheduled 09/2015--please advise

## 2015-08-18 ENCOUNTER — Encounter: Payer: Self-pay | Admitting: Internal Medicine

## 2015-08-18 ENCOUNTER — Ambulatory Visit (INDEPENDENT_AMBULATORY_CARE_PROVIDER_SITE_OTHER): Payer: No Typology Code available for payment source | Admitting: Internal Medicine

## 2015-08-18 VITALS — BP 120/78 | HR 81 | Temp 98.4°F | Wt 157.0 lb

## 2015-08-18 DIAGNOSIS — G43009 Migraine without aura, not intractable, without status migrainosus: Secondary | ICD-10-CM | POA: Diagnosis not present

## 2015-08-18 DIAGNOSIS — G43019 Migraine without aura, intractable, without status migrainosus: Secondary | ICD-10-CM | POA: Diagnosis not present

## 2015-08-18 MED ORDER — PROMETHAZINE HCL 25 MG/ML IJ SOLN
12.5000 mg | Freq: Once | INTRAMUSCULAR | Status: AC
  Administered 2015-08-18: 12.5 mg via INTRAMUSCULAR

## 2015-08-18 MED ORDER — KETOROLAC TROMETHAMINE 30 MG/ML IJ SOLN
30.0000 mg | Freq: Once | INTRAMUSCULAR | Status: AC
  Administered 2015-08-18: 30 mg via INTRAMUSCULAR

## 2015-08-18 NOTE — Progress Notes (Signed)
Pre visit review using our clinic review tool, if applicable. No additional management support is needed unless otherwise documented below in the visit note. 

## 2015-08-18 NOTE — Patient Instructions (Signed)

## 2015-08-18 NOTE — Progress Notes (Signed)
Subjective:    Patient ID: Ashley [name], female    DOB: 05/15/1966, 49 y.o.   MRN: [id number]  HPI  Pt presents to the clinic today with c/o a migraine. This started yesterday. The pain is behind her right eye. She describes the pain as sharp and throbbing. She has had some blurred vision but denies dizziness. She is sensitive to light and sounds. She has had some nausea and vomiting. She has taken her Maxalt but it has not helped. She also takes Verapamil and Amitriptyline daily for migraine prevention and she does feel like her migraines are less frequent, her last one being in 03/2015. She no longer follows with Dr. Delice Lesch, just Dr. Deborra Medina.  Review of Systems      Past Medical History  Diagnosis Date  . Anxiety   . Tobacco abuse   . Cataract     bilateral  . SVD (spontaneous vaginal delivery)     x 3  . Bronchitis     uses inhaler if needed for bronchitis, lasted used -2014  . GERD (gastroesophageal reflux disease)   . History of blood transfusion 1966-07[birth year]12    at birth in [place], [place], unsure number of units  . Diabetes mellitus without complication     taken off Metformin since 04/2014, HgbA1C - normal, will follow up with PCP- Dr. Deborra Medina, 07/2014  . H/O exercise stress test 2011    done at [facility]- told that it was WNL, done due to pt. having panic attacks   . Chronic headaches     migraines - in past, uses Phenergan for nausea   . Poor dentition     very poor oral health     Current Outpatient Prescriptions  Medication Sig Dispense Refill  . amitriptyline (ELAVIL) 25 MG tablet Take 1-1/2 tablet at dinner time 45 tablet 3  . clonazePAM (KLONOPIN) 1 MG tablet Take 1 tablet (1 mg total) by mouth 3 (three) times daily as needed for anxiety. 90 tablet 0  . dicyclomine (BENTYL) 10 MG capsule TAKE ONE CAPSULE BY MOUTH TWICE DAILY AS NEEDED FOR CRAMPING AND ABDOMINAL PAIN 60 capsule 0  . estrogens, conjugated, (PREMARIN) 0.3 MG tablet Take 1 tablet (0.3 mg total) by mouth daily.  Take daily for 21 days then do not take for 7 days. 30 tablet 3  . meloxicam (MOBIC) 15 MG tablet Take 1 tablet (15 mg total) by mouth daily. Take with food for low back pain 30 tablet 1  . metFORMIN (GLUCOPHAGE) 500 MG tablet TAKE 1 TABLET DAILY WITH BREAKFAST 90 tablet 1  . omeprazole (PRILOSEC) 40 MG capsule TAKE 1 CAPSULE DAILY 120 capsule 3  . polyethylene glycol powder (GLYCOLAX/MIRALAX) powder Take 17 g by mouth daily. Until daily soft stools  OTC    . rizatriptan (MAXALT-MLT) 10 MG disintegrating tablet Take 1 tablet (10 mg total) by mouth as needed for migraine. May repeat in 2 hours if needed. Do not take more than 3 tablets in a week. 10 tablet 3  . traZODone (DESYREL) 150 MG tablet Take 1 tablet (150 mg total) by mouth at bedtime. 90 tablet 0  . verapamil (CALAN) 40 MG tablet Take 1 tablet daily 30 tablet 4   No current facility-administered medications for this visit.    Allergies  Allergen Reactions  . Penicillins Anaphylaxis    REACTION: Throat swells  . Topamax [Topiramate]     Hands and feet to go numb    Family History  Problem Relation  Age of Onset  . Diabetes Mother   . Hyperlipidemia Mother   . Hypertension Mother   . Colon cancer Neg Hx   . Rectal cancer Neg Hx   . Stomach cancer Neg Hx   . Prostate cancer Father   . Lung cancer Maternal Grandfather     Social History   Social History  . Marital Status: Married    Spouse Name: N/A  . Number of Children: 6  . Years of Education: N/A   Occupational History  .       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 Smoking status: Former Smoker -- 1.00 packs/day for 35 years    Types: Cigarettes, E-cigarettes    Quit date: 02/02/2014  . Smokeless tobacco: Never Used  . Alcohol Use: No  . Drug Use: No  . Sexual Activity: Yes    Birth Control/ Protection: Pill   Other Topics Concern  . Not on file   Social History Narrative   5 living children, one child is a crack cocaine addict  who often breaks into their house to steal money. One child died of cerebral palsy.     Constitutional: Pt reports headache. Denies fever, malaise, fatigue, or abrupt weight changes.  HEENT: Denies eye pain, eye redness, ear pain, ringing in the ears, wax buildup, runny nose, nasal congestion, bloody nose, or sore throat. Respiratory: Denies difficulty breathing, shortness of breath, cough or sputum production.   Cardiovascular: Denies chest pain, chest tightness, palpitations or swelling in the hands or feet.  Neurological: Denies dizziness, difficulty with memory, difficulty with speech or problems with balance and coordination.   No other specific complaints in a complete review of systems (except as listed in HPI above).  Objective:   Physical Exam   BP 120/78 mmHg  Pulse 81  Temp(Src) 98.4 F (36.9 C) (Oral)  Wt 157 lb (71.215 kg)  SpO2 98%  LMP 02/12/2014 Wt Readings from Last 3 Encounters:  08/18/15 157 lb (71.215 kg)  07/21/15 154 lb (69.854 kg)  06/30/15 154 lb (69.854 kg)    General: Appears her stated age, laying down on the exam table, in NAD. HEENT: Head: normal shape and size; Eyes: sclera white, no icterus, conjunctiva pink, PERRLA and EOMs intact;  Cardiovascular: Normal rate and rhythm. S1,S2 noted.   Pulmonary/Chest: Normal effort and positive vesicular breath sounds. No respiratory distress. No wheezes, rales or ronchi noted.  Abdomen: Soft and nontender. Normal bowel sounds. Neurological: Alert and oriented. Coordination normal.    BMET    Component Value Date/Time   NA 140 07/12/2015 0806   K 4.2 07/12/2015 0806   CL 103 07/12/2015 0806   CO2 29 07/12/2015 0806   GLUCOSE 108* 07/12/2015 0806   BUN 13 07/12/2015 0806   CREATININE 0.79 07/12/2015 0806   CREATININE 0.77 10/29/2014 1317   CALCIUM 9.5 07/12/2015 0806   GFRNONAA >60 04/25/2015 1210   GFRAA >60 04/25/2015 1210    Lipid Panel     Component Value Date/Time   CHOL 149 07/12/2015 0806    TRIG 189.0* 07/12/2015 0806   HDL 41.10 07/12/2015 0806   CHOLHDL 4 07/12/2015 0806   VLDL 37.8 07/12/2015 0806   LDLCALC 70 07/12/2015 0806    CBC    Component Value Date/Time   WBC 8.4 04/25/2015 1210   RBC 3.87 04/25/2015 1210   HGB 12.2 04/25/2015 1210   HCT 36.2 04/25/2015 1210   PLT 374 04/25/2015 1210  MCV 93.5 04/25/2015 1210   MCH 31.5 04/25/2015 1210   MCHC 33.7 04/25/2015 1210   RDW 13.9 04/25/2015 1210   LYMPHSABS 2.5 04/07/2015 0843   MONOABS 0.5 04/07/2015 0843   EOSABS 0.2 04/07/2015 0843   BASOSABS 0.0 04/07/2015 0843    Hgb A1C Lab Results  Component Value Date   HGBA1C 6.0 07/12/2015       Assessment & Plan:   Migraine:  30 mg Toradol IM today 12.5 mg Phenergan IM today She will continue Verapamil and Amitriptyline Continue Maxalt prn  If pain persist or worsens, follow up with PCP or go to the UC/ER

## 2015-09-15 ENCOUNTER — Encounter: Payer: Self-pay | Admitting: Primary Care

## 2015-09-15 ENCOUNTER — Ambulatory Visit (INDEPENDENT_AMBULATORY_CARE_PROVIDER_SITE_OTHER): Payer: No Typology Code available for payment source | Admitting: Primary Care

## 2015-09-15 ENCOUNTER — Ambulatory Visit (INDEPENDENT_AMBULATORY_CARE_PROVIDER_SITE_OTHER)
Admission: RE | Admit: 2015-09-15 | Discharge: 2015-09-15 | Disposition: A | Discharge status: Home / Self Care | Payer: No Typology Code available for payment source | Source: Ambulatory Visit | Attending: Primary Care | Admitting: Primary Care

## 2015-09-15 VITALS — BP 126/82 | HR 86 | Temp 98.1°F | Ht 62.25 in | Wt 156.8 lb

## 2015-09-15 DIAGNOSIS — K5900 Constipation, unspecified: Secondary | ICD-10-CM | POA: Diagnosis not present

## 2015-09-15 NOTE — Progress Notes (Signed)
Subjective:    Patient ID: Ashley [name], female    DOB: [birth year], 49 y.o.   MRN: [id number]  HPI  Ashley [name] is a 49 year old female who presents today with a chief complaint of constipation. She's experienced constipation and firm stools since she had her gallbladder was removed in May 2015. She started taking Miralax daily and has been taking since. Last bowel movement was on Saturday the 10th. She took magnesium citrate on the 19th and had loose stools until the 20th. After eating dinner on the 20th she felt immediately full and started experiencing cramping to bilateral lower abdomen with nausea, no vomiting. She's had no bowel movement since Tuesday. She's not taken any other medications besides Miralax and magnesium citrate. Denies vomiting, bloody stools, fevers. She drinks little to no water daily and consumes little fiber.  Review of Systems  Constitutional: Negative for fever and chills.  Respiratory: Negative for shortness of breath.   Cardiovascular: Negative for chest pain.  Gastrointestinal: Positive for nausea, abdominal pain, constipation and abdominal distention. Negative for vomiting and blood in stool.       Past Medical History  Diagnosis Date  . Anxiety   . Tobacco abuse   . Cataract     bilateral  . SVD (spontaneous vaginal delivery)     x 3  . Bronchitis     uses inhaler if needed for bronchitis, lasted used -2014  . GERD (gastroesophageal reflux disease)   . History of blood transfusion 1966/09[birth year]    at birth in [place], [place], unsure number of units  . Diabetes mellitus without complication     taken off Metformin since 04/2014, HgbA1C - normal, will follow up with PCP- Dr. Deborra Medina, 07/2014  . H/O exercise stress test 2011    done at [facility]- told that it was WNL, done due to pt. having panic attacks   . Chronic headaches     migraines - in past, uses Phenergan for nausea   . Poor dentition     very poor oral health     Social History   Social History  .  Marital Status: Married    Spouse Name: N/A  . Number of Children: 6  . Years of Education: N/A   Occupational History  .       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 Smoking status: Former Smoker -- 1.00 packs/day for 35 years    Types: Cigarettes, E-cigarettes    Quit date: 02/02/2014  . Smokeless tobacco: Never Used  . Alcohol Use: No  . Drug Use: No  . Sexual Activity: Yes    Birth Control/ Protection: Pill   Other Topics Concern  . Not on file   Social History Narrative   5 living children, one child is a crack cocaine addict who often breaks into their house to steal money. One child died of cerebral palsy.    Past Surgical History  Procedure Laterality Date  . Appendectomy  1988  . Tubal ligation    . Esophagogastroduodenoscopy  12/28/2013  . Knee arthroscopy  1995    left  . Colonoscopy  02/16/2014    normal   . Laparoscopic assisted vaginal hysterectomy N/A 04/15/2014    Procedure: LAPAROSCOPIC ASSISTED VAGINAL HYSTERECTOMY;  Surgeon: Marylynn Pearson, MD;  Location: [facility];  Service: Gynecology;  Laterality: N/A;  . Laparoscopic bilateral salpingo oopherectomy Bilateral 04/15/2014    Procedure: LAPAROSCOPIC BILATERAL SALPINGO OOPHORECTOMY;  Surgeon:  Marylynn Pearson, MD;  Location: [facility];  Service: Gynecology;  Laterality: Bilateral;  . Abdominal hysterectomy    . Cholecystectomy N/A 06/07/2014    Procedure: LAPAROSCOPIC CHOLECYSTECTOMY;  Surgeon: Ralene Ok, MD;  Location: [facility];  Service: General;  Laterality: N/A;  . Cholecystectomy  June 07 2014    Family History  Problem Relation Age of Onset  . Diabetes Mother   . Hyperlipidemia Mother   . Hypertension Mother   . Colon cancer Neg Hx   . Rectal cancer Neg Hx   . Stomach cancer Neg Hx   . Prostate cancer Father   . Lung cancer Maternal Grandfather     Allergies  Allergen Reactions  . Penicillins Anaphylaxis    REACTION: Throat swells  . Topamax [Topiramate]     Hands  and feet to go numb    Current Outpatient Prescriptions on File Prior to Visit  Medication Sig Dispense Refill  . amitriptyline (ELAVIL) 25 MG tablet Take 1-1/2 tablet at dinner time 45 tablet 3  . clonazePAM (KLONOPIN) 1 MG tablet Take 1 tablet (1 mg total) by mouth 3 (three) times daily as needed for anxiety. 90 tablet 0  . dicyclomine (BENTYL) 10 MG capsule TAKE ONE CAPSULE BY MOUTH TWICE DAILY AS NEEDED FOR CRAMPING AND ABDOMINAL PAIN 60 capsule 0  . estrogens, conjugated, (PREMARIN) 0.3 MG tablet Take 1 tablet (0.3 mg total) by mouth daily. Take daily for 21 days then do not take for 7 days. 30 tablet 3  . meloxicam (MOBIC) 15 MG tablet Take 1 tablet (15 mg total) by mouth daily. Take with food for low back pain 30 tablet 1  . metFORMIN (GLUCOPHAGE) 500 MG tablet TAKE 1 TABLET DAILY WITH BREAKFAST 90 tablet 1  . omeprazole (PRILOSEC) 40 MG capsule TAKE 1 CAPSULE DAILY 120 capsule 3  . polyethylene glycol powder (GLYCOLAX/MIRALAX) powder Take 17 g by mouth daily. Until daily soft stools  OTC    . rizatriptan (MAXALT-MLT) 10 MG disintegrating tablet Take 1 tablet (10 mg total) by mouth as needed for migraine. May repeat in 2 hours if needed. Do not take more than 3 tablets in a week. 10 tablet 3  . traZODone (DESYREL) 150 MG tablet Take 1 tablet (150 mg total) by mouth at bedtime. 90 tablet 0  . verapamil (CALAN) 40 MG tablet Take 1 tablet daily 30 tablet 4   No current facility-administered medications on file prior to visit.    BP 126/82 mmHg  Pulse 86  Temp(Src) 98.1 F (36.7 C) (Oral)  Ht 5' 2.25" (1.581 m)  Wt 156 lb 12.8 oz (71.124 kg)  BMI 28.45 kg/m2  SpO2 97%  LMP 02/12/2014    Objective:   Physical Exam  Constitutional: She appears well-nourished.  Cardiovascular: Normal rate and regular rhythm.   Pulmonary/Chest: Effort normal and breath sounds normal.  Abdominal: Bowel sounds are normal. There is tenderness in the right lower quadrant and left lower quadrant.  There is no rebound, no tenderness at McBurney's point and negative Murphy's sign.  Mild abdominal distension   Skin: Skin is warm and dry.          Assessment & Plan:  Constipation:  History of since gall bladder removed in May 2015. Last BM 09/10 until 09/19 after taking magnesium citrate. No BM since Tuesday. Bowel sounds present on exam with tenderness to bilateral lower quadrants. KUB today to rule out blockage. Obtain enema OTC, instructions provided. Start daily colace as miralax is not helpful.  Increase water and fiber consumption. Follow up if no improvement.

## 2015-09-15 NOTE — Patient Instructions (Signed)
Start taking Colace daily to help soften your stool. This may be purchased over the counter.  Increase consumption of fiber such as fruits, vegetables, and beans.  You must drink more water to help with constipation. You need about 2 liters daily.  You may purchase an enema over the counter to initiate a bowel movement. Follow the package instructions.   Complete xray(s) prior to leaving today. I will contact you regarding your results.  It was a pleasure to see you today!    Constipation Constipation is when a person has fewer than three bowel movements a week, has difficulty having a bowel movement, or has stools that are dry, hard, or larger than normal. As people grow older, constipation is more common. If you try to fix constipation with medicines that make you have a bowel movement (laxatives), the problem may get worse. Long-term laxative use may cause the muscles of the colon to become weak. A low-fiber diet, not taking in enough fluids, and taking certain medicines may make constipation worse.  CAUSES   Certain medicines, such as antidepressants, pain medicine, iron supplements, antacids, and water pills.   Certain diseases, such as diabetes, irritable bowel syndrome (IBS), thyroid disease, or depression.   Not drinking enough water.   Not eating enough fiber-rich foods.   Stress or travel.   Lack of physical activity or exercise.   Ignoring the urge to have a bowel movement.   Using laxatives too much.  SIGNS AND SYMPTOMS   Having fewer than three bowel movements a week.   Straining to have a bowel movement.   Having stools that are hard, dry, or larger than normal.   Feeling full or bloated.   Pain in the lower abdomen.   Not feeling relief after having a bowel movement.  DIAGNOSIS  Your health care provider will take a medical history and perform a physical exam. Further testing may be done for severe constipation. Some tests may include:  A  barium enema X-ray to examine your rectum, colon, and, sometimes, your small intestine.   A sigmoidoscopy to examine your lower colon.   A colonoscopy to examine your entire colon. TREATMENT  Treatment will depend on the severity of your constipation and what is causing it. Some dietary treatments include drinking more fluids and eating more fiber-rich foods. Lifestyle treatments may include regular exercise. If these diet and lifestyle recommendations do not help, your health care provider may recommend taking over-the-counter laxative medicines to help you have bowel movements. Prescription medicines may be prescribed if over-the-counter medicines do not work.  HOME CARE INSTRUCTIONS   Eat foods that have a lot of fiber, such as fruits, vegetables, whole grains, and beans.  Limit foods high in fat and processed sugars, such as french fries, hamburgers, cookies, candies, and soda.   A fiber supplement may be added to your diet if you cannot get enough fiber from foods.   Drink enough fluids to keep your urine clear or pale yellow.   Exercise regularly or as directed by your health care provider.   Go to the restroom when you have the urge to go. Do not hold it.   Only take over-the-counter or prescription medicines as directed by your health care provider. Do not take other medicines for constipation without talking to your health care provider first.  Citrus IF:   You have bright red blood in your stool.   Your constipation lasts for more than 4 days or gets  worse.   You have abdominal or rectal pain.   You have thin, pencil-like stools.   You have unexplained weight loss. MAKE SURE YOU:   Understand these instructions.  Will watch your condition.  Will get help right away if you are not doing well or get worse. Document Released: 09/07/2004 Document Revised: 12/15/2013 Document Reviewed: 09/21/2013 [facility] Patient Information 2015  [place], [place]. This information is not intended to replace advice given to you by your health care provider. Make sure you discuss any questions you have with your health care provider.

## 2015-09-15 NOTE — Progress Notes (Signed)
Pre visit review using our clinic review tool, if applicable. No additional management support is needed unless otherwise documented below in the visit note. 

## 2015-09-21 ENCOUNTER — Ambulatory Visit: Payer: No Typology Code available for payment source | Admitting: Family Medicine

## 2015-09-26 ENCOUNTER — Other Ambulatory Visit: Payer: Self-pay | Admitting: Family Medicine

## 2015-09-26 ENCOUNTER — Other Ambulatory Visit: Payer: Self-pay

## 2015-09-26 MED ORDER — PROMETHAZINE HCL 25 MG PO TABS: 25.0000 mg | ORAL_TABLET | Freq: Four times a day (QID) | ORAL | Status: DC | PRN

## 2015-09-26 MED ORDER — CLONAZEPAM 1 MG PO TABS: 1.0000 mg | ORAL_TABLET | Freq: Three times a day (TID) | ORAL | Status: DC | PRN

## 2015-09-26 MED ORDER — DICYCLOMINE HCL 10 MG PO CAPS
ORAL_CAPSULE | ORAL | Status: DC
Start: 2015-09-26 — End: 2015-11-24

## 2015-09-26 NOTE — Telephone Encounter (Signed)
Rx called in to requested pharmacy 

## 2015-09-26 NOTE — Telephone Encounter (Signed)
Attempted to contact pharmacy; line busy 

## 2015-09-26 NOTE — Telephone Encounter (Signed)
Pt request refill [facility]gan [facility]; last refilled # 30 on 02/19/2014. Last seen for nausea on 09/15/15.

## 2015-09-26 NOTE — Telephone Encounter (Signed)
Pt request refill clonazepam(last refilled # 90 on 07/12/15) and bentyl(last refilled # 60 on 07/29/15) to [facility]. Pt out of med and request cb when refilled. Last annual exam 04/12/15.

## 2015-10-04 ENCOUNTER — Encounter: Payer: Self-pay | Admitting: Family Medicine

## 2015-10-04 ENCOUNTER — Ambulatory Visit (INDEPENDENT_AMBULATORY_CARE_PROVIDER_SITE_OTHER): Payer: No Typology Code available for payment source | Admitting: Family Medicine

## 2015-10-04 VITALS — BP 110/60 | HR 76 | Temp 97.9°F | Wt 156.8 lb

## 2015-10-04 DIAGNOSIS — J208 Acute bronchitis due to other specified organisms: Secondary | ICD-10-CM

## 2015-10-04 MED ORDER — AZITHROMYCIN 250 MG PO TABS: ORAL_TABLET | ORAL | Status: DC

## 2015-10-04 NOTE — Progress Notes (Signed)
SUBJECTIVE:  Ashley [name] is a 49 y.o. female who complains of coryza, congestion, sneezing, productive cough,chills, and bilateral sinus pain for 10 days. She denies a history of chest pain, dizziness and fatigue and denies a history of asthma. Patient does smoke cigarettes.   Current Outpatient Prescriptions on File Prior to Visit  Medication Sig Dispense Refill  . amitriptyline (ELAVIL) 25 MG tablet Take 1-1/2 tablet at dinner time 45 tablet 3  . clonazePAM (KLONOPIN) 1 MG tablet Take 1 tablet (1 mg total) by mouth 3 (three) times daily as needed for anxiety. 90 tablet 0  . dicyclomine (BENTYL) 10 MG capsule TAKE ONE CAPSULE BY MOUTH TWICE DAILY AS NEEDED FOR CRAMPING AND ABDOMINAL PAIN 60 capsule 0  . estrogens, conjugated, (PREMARIN) 0.3 MG tablet Take 1 tablet (0.3 mg total) by mouth daily. Take daily for 21 days then do not take for 7 days. 30 tablet 3  . meloxicam (MOBIC) 15 MG tablet Take 1 tablet (15 mg total) by mouth daily. Take with food for low back pain 30 tablet 1  . metFORMIN (GLUCOPHAGE) 500 MG tablet TAKE 1 TABLET DAILY WITH BREAKFAST 90 tablet 1  . omeprazole (PRILOSEC) 40 MG capsule TAKE 1 CAPSULE DAILY 120 capsule 3  . polyethylene glycol powder (GLYCOLAX/MIRALAX) powder Take 17 g by mouth daily. Until daily soft stools  OTC    . promethazine (PHENERGAN) 25 MG tablet Take 1 tablet (25 mg total) by mouth every 6 (six) hours as needed for nausea or vomiting. 30 tablet 0  . rizatriptan (MAXALT-MLT) 10 MG disintegrating tablet Take 1 tablet (10 mg total) by mouth as needed for migraine. May repeat in 2 hours if needed. Do not take more than 3 tablets in a week. 10 tablet 3  . traZODone (DESYREL) 150 MG tablet Take 1 tablet (150 mg total) by mouth at bedtime. 90 tablet 0  . verapamil (CALAN) 40 MG tablet Take 1 tablet daily 30 tablet 4   No current facility-administered medications on file prior to visit.    Allergies  Allergen Reactions  . Penicillins Anaphylaxis   REACTION: Throat swells  . Topamax [Topiramate]     Hands and feet to go numb    Past Medical History  Diagnosis Date  . Anxiety   . Tobacco abuse   . Cataract     bilateral  . SVD (spontaneous vaginal delivery)     x 3  . Bronchitis     uses inhaler if needed for bronchitis, lasted used -2014  . GERD (gastroesophageal reflux disease)   . History of blood transfusion 07/24/1966    at birth in [place], [place], unsure number of units  . Diabetes mellitus without complication ([place])     taken off Metformin since 04/2014, HgbA1C - normal, will follow up with PCP- Dr. Deborra Medina, 07/2014  . H/O exercise stress test 2011    done at [facility]- told that it was WNL, done due to pt. having panic attacks   . Chronic headaches     migraines - in past, uses Phenergan for nausea   . Poor dentition     very poor oral health     Past Surgical History  Procedure Laterality Date  . Appendectomy  1988  . Tubal ligation    . Esophagogastroduodenoscopy  12/28/2013  . Knee arthroscopy  1995    left  . Colonoscopy  02/16/2014    normal   . Laparoscopic assisted vaginal hysterectomy N/A 04/15/2014    Procedure:  LAPAROSCOPIC ASSISTED VAGINAL HYSTERECTOMY;  Surgeon: Marylynn Pearson, MD;  Location: [facility];  Service: Gynecology;  Laterality: N/A;  . Laparoscopic bilateral salpingo oopherectomy Bilateral 04/15/2014    Procedure: LAPAROSCOPIC BILATERAL SALPINGO OOPHORECTOMY;  Surgeon: Marylynn Pearson, MD;  Location: [facility];  Service: Gynecology;  Laterality: Bilateral;  . Abdominal hysterectomy    . Cholecystectomy N/A 06/07/2014    Procedure: LAPAROSCOPIC CHOLECYSTECTOMY;  Surgeon: Ralene Ok, MD;  Location: [facility];  Service: General;  Laterality: N/A;  . Cholecystectomy  June 07 2014    Family History  Problem Relation Age of Onset  . Diabetes Mother   . Hyperlipidemia Mother   . Hypertension Mother   . Colon cancer Neg Hx   . Rectal cancer Neg Hx   . Stomach cancer Neg Hx   . Prostate cancer Father    . Lung cancer Maternal Grandfather     Social History   Social History  . Marital Status: Married    Spouse Name: N/A  . Number of Children: 6  . Years of Education: N/A   Occupational History  .       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 Smoking status: Former Smoker -- 1.00 packs/day for 35 years    Types: Cigarettes, E-cigarettes    Quit date: 02/02/2014  . Smokeless tobacco: Never Used  . Alcohol Use: No  . Drug Use: No  . Sexual Activity: Yes    Birth Control/ Protection: Pill   Other Topics Concern  . Not on file   Social History Narrative   5 living children, one child is a crack cocaine addict who often breaks into their house to steal money. One child died of cerebral palsy.   The PMH, PSH, Social History, Family History, Medications, and allergies have been reviewed in [facility], and have been updated if relevant.  OBJECTIVE: BP 110/60 mmHg  Pulse 76  Temp(Src) 97.9 F (36.6 C) (Oral)  Wt 156 lb 12 oz (71.101 kg)  SpO2 97%  LMP 02/12/2014  She appears well, vital signs are as noted. Ears normal.  Throat and pharynx normal.  Neck supple. No adenopathy in the neck. Nose is congested. Sinuses tender. Wheezes bilateral lower lung bases with rales  ASSESSMENT:  sinusitis and bronchitis  PLAN: Zpack, flonase, as needed proair. Symptomatic therapy suggested: push fluids, rest and return office visit prn if symptoms persist or worsen. . Call or return to clinic prn if these symptoms worsen or fail to improve as anticipated.

## 2015-10-04 NOTE — Progress Notes (Signed)
Pre visit review using our clinic review tool, if applicable. No additional management support is needed unless otherwise documented below in the visit note. 

## 2015-10-10 ENCOUNTER — Encounter: Payer: Self-pay | Admitting: Family Medicine

## 2015-10-10 ENCOUNTER — Encounter: Payer: Self-pay | Admitting: *Deleted

## 2015-10-10 ENCOUNTER — Ambulatory Visit (INDEPENDENT_AMBULATORY_CARE_PROVIDER_SITE_OTHER): Payer: No Typology Code available for payment source | Admitting: Family Medicine

## 2015-10-10 ENCOUNTER — Other Ambulatory Visit (INDEPENDENT_AMBULATORY_CARE_PROVIDER_SITE_OTHER): Payer: No Typology Code available for payment source

## 2015-10-10 ENCOUNTER — Other Ambulatory Visit: Payer: Self-pay | Admitting: Family Medicine

## 2015-10-10 VITALS — BP 116/62 | HR 65 | Temp 98.0°F | Wt 155.8 lb

## 2015-10-10 DIAGNOSIS — R739 Hyperglycemia, unspecified: Secondary | ICD-10-CM

## 2015-10-10 DIAGNOSIS — J209 Acute bronchitis, unspecified: Secondary | ICD-10-CM

## 2015-10-10 DIAGNOSIS — E785 Hyperlipidemia, unspecified: Secondary | ICD-10-CM

## 2015-10-10 LAB — COMPREHENSIVE METABOLIC PANEL WITH GFR
ALT: 12 U/L (ref 0–35)
AST: 13 U/L (ref 0–37)
Albumin: 3.9 g/dL (ref 3.5–5.2)
Alkaline Phosphatase: 111 U/L (ref 39–117)
BUN: 9 mg/dL (ref 6–23)
CO2: 27 meq/L (ref 19–32)
Calcium: 9.3 mg/dL (ref 8.4–10.5)
Chloride: 103 meq/L (ref 96–112)
Creatinine, Ser: 0.86 mg/dL (ref 0.40–1.20)
GFR: 74.31 mL/min
Glucose, Bld: 151 mg/dL — ABNORMAL HIGH (ref 70–99)
Potassium: 4.4 meq/L (ref 3.5–5.1)
Sodium: 140 meq/L (ref 135–145)
Total Bilirubin: 0.3 mg/dL (ref 0.2–1.2)
Total Protein: 6.6 g/dL (ref 6.0–8.3)

## 2015-10-10 LAB — LIPID PANEL
CHOLESTEROL: 187 mg/dL (ref 0–200)
HDL: 38.4 mg/dL — ABNORMAL LOW (ref >39.00)
LDL Cholesterol: 125 mg/dL — ABNORMAL HIGH (ref 0–99)
NonHDL: 148.77
TRIGLYCERIDES: 119 mg/dL (ref 0.0–149.0)
Total CHOL/HDL Ratio: 5
VLDL: 23.8 mg/dL (ref 0.0–40.0)

## 2015-10-10 LAB — HEMOGLOBIN A1C: Hgb A1c MFr Bld: 6 % (ref 4.6–6.5)

## 2015-10-10 MED ORDER — HYDROCOD POLST-CPM POLST ER 10-8 MG/5ML PO SUER: 5.0000 mL | Freq: Two times a day (BID) | ORAL | Status: DC | PRN

## 2015-10-10 MED ORDER — PREDNISONE 20 MG PO TABS: ORAL_TABLET | ORAL | Status: DC

## 2015-10-10 NOTE — Progress Notes (Signed)
Pre visit review using our clinic review tool, if applicable. No additional management support is needed unless otherwise documented below in the visit note. 

## 2015-10-10 NOTE — Assessment & Plan Note (Signed)
Persistent wheezes on exam. Prednisone to decrease inflammation and open airways, prn tussionex given as well. Call or return to clinic prn if these symptoms worsen or fail to improve as anticipated. The patient indicates understanding of these issues and agrees with the plan.

## 2015-10-10 NOTE — Progress Notes (Signed)
Subjective:   Patient ID: Ashley [name], female    DOB: 1966-03-22, 49 y.o.   MRN: [id number]  Ashley [name] is a pleasant 49 y.o. year old female who presents to clinic today with Cough and Bronchitis  on 10/10/2015  HPI: Saw her on 10/04/15 for bronchitis.  Given rx for zpack- finished this two days. Using proair inhaler.  Feels is worse- dry but "strong." _+ chills, no fevers.  Current Outpatient Prescriptions on File Prior to Visit  Medication Sig Dispense Refill  . amitriptyline (ELAVIL) 25 MG tablet Take 1-1/2 tablet at dinner time 45 tablet 3  . clonazePAM (KLONOPIN) 1 MG tablet Take 1 tablet (1 mg total) by mouth 3 (three) times daily as needed for anxiety. 90 tablet 0  . dicyclomine (BENTYL) 10 MG capsule TAKE ONE CAPSULE BY MOUTH TWICE DAILY AS NEEDED FOR CRAMPING AND ABDOMINAL PAIN 60 capsule 0  . estrogens, conjugated, (PREMARIN) 0.3 MG tablet Take 1 tablet (0.3 mg total) by mouth daily. Take daily for 21 days then do not take for 7 days. 30 tablet 3  . meloxicam (MOBIC) 15 MG tablet Take 1 tablet (15 mg total) by mouth daily. Take with food for low back pain 30 tablet 1  . metFORMIN (GLUCOPHAGE) 500 MG tablet TAKE 1 TABLET DAILY WITH BREAKFAST 90 tablet 1  . omeprazole (PRILOSEC) 40 MG capsule TAKE 1 CAPSULE DAILY 120 capsule 3  . polyethylene glycol powder (GLYCOLAX/MIRALAX) powder Take 17 g by mouth daily. Until daily soft stools  OTC    . promethazine (PHENERGAN) 25 MG tablet Take 1 tablet (25 mg total) by mouth every 6 (six) hours as needed for nausea or vomiting. 30 tablet 0  . rizatriptan (MAXALT-MLT) 10 MG disintegrating tablet Take 1 tablet (10 mg total) by mouth as needed for migraine. May repeat in 2 hours if needed. Do not take more than 3 tablets in a week. 10 tablet 3  . traZODone (DESYREL) 150 MG tablet Take 1 tablet (150 mg total) by mouth at bedtime. 90 tablet 0  . verapamil (CALAN) 40 MG tablet Take 1 tablet daily 30 tablet 4   No current  facility-administered medications on file prior to visit.    Allergies  Allergen Reactions  . Penicillins Anaphylaxis    REACTION: Throat swells  . Topamax [Topiramate]     Hands and feet to go numb    Past Medical History  Diagnosis Date  . Anxiety   . Tobacco abuse   . Cataract     bilateral  . SVD (spontaneous vaginal delivery)     x 3  . Bronchitis     uses inhaler if needed for bronchitis, lasted used -2014  . GERD (gastroesophageal reflux disease)   . History of blood transfusion 1966-01[birth year]    at birth in [place], [place], unsure number of units  . Diabetes mellitus without complication ([place])     taken off Metformin since 04/2014, HgbA1C - normal, will follow up with PCP- Dr. Deborra Medina, 07/2014  . H/O exercise stress test 2011    done at [facility]- told that it was WNL, done due to pt. having panic attacks   . Chronic headaches     migraines - in past, uses Phenergan for nausea   . Poor dentition     very poor oral health     Past Surgical History  Procedure Laterality Date  . Appendectomy  1988  . Tubal ligation    . Esophagogastroduodenoscopy  12/28/2013  .  Knee arthroscopy  1995    left  . Colonoscopy  02/16/2014    normal   . Laparoscopic assisted vaginal hysterectomy N/A 04/15/2014    Procedure: LAPAROSCOPIC ASSISTED VAGINAL HYSTERECTOMY;  Surgeon: Marylynn Pearson, MD;  Location: [facility];  Service: Gynecology;  Laterality: N/A;  . Laparoscopic bilateral salpingo oopherectomy Bilateral 04/15/2014    Procedure: LAPAROSCOPIC BILATERAL SALPINGO OOPHORECTOMY;  Surgeon: Marylynn Pearson, MD;  Location: [facility];  Service: Gynecology;  Laterality: Bilateral;  . Abdominal hysterectomy    . Cholecystectomy N/A 06/07/2014    Procedure: LAPAROSCOPIC CHOLECYSTECTOMY;  Surgeon: Ralene Ok, MD;  Location: [facility];  Service: General;  Laterality: N/A;  . Cholecystectomy  June 07 2014    Family History  Problem Relation Age of Onset  . Diabetes Mother   . Hyperlipidemia Mother   .  Hypertension Mother   . Colon cancer Neg Hx   . Rectal cancer Neg Hx   . Stomach cancer Neg Hx   . Prostate cancer Father   . Lung cancer Maternal Grandfather     Social History   Social History  . Marital Status: Married    Spouse Name: N/A  . Number of Children: 6  . Years of Education: N/A   Occupational History  .       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 Smoking status: Former Smoker -- 1.00 packs/day for 35 years    Types: Cigarettes, E-cigarettes    Quit date: 02/02/2014  . Smokeless tobacco: Never Used  . Alcohol Use: No  . Drug Use: No  . Sexual Activity: Yes    Birth Control/ Protection: Pill   Other Topics Concern  . Not on file   Social History Narrative   5 living children, one child is a crack cocaine addict who often breaks into their house to steal money. One child died of cerebral palsy.   The PMH, PSH, Social History, Family History, Medications, and allergies have been reviewed in [facility], and have been updated if relevant.  Review of Systems  Constitutional: Positive for chills and fatigue. Negative for fever.  HENT: Negative.   Respiratory: Positive for cough and wheezing. Negative for shortness of breath and stridor.   Cardiovascular: Negative.   Gastrointestinal: Negative.   Musculoskeletal: Negative.   Skin: Negative.   Hematological: Negative.   Psychiatric/Behavioral: Negative.   All other systems reviewed and are negative.      Objective:    BP 116/62 mmHg  Pulse 65  Temp(Src) 98 F (36.7 C) (Oral)  Wt 155 lb 12 oz (70.648 kg)  SpO2 97%  LMP 02/12/2014   Physical Exam  Constitutional: She is oriented to person, place, and time. She appears well-developed and well-nourished. No distress.  HENT:  Head: Normocephalic.  Eyes: Conjunctivae are normal.  Neck: Normal range of motion.  Cardiovascular: Normal rate.   Pulmonary/Chest: Effort normal. No respiratory distress. She has wheezes. She has no rales.  She exhibits no tenderness.  Musculoskeletal: Normal range of motion.  Neurological: She is alert and oriented to person, place, and time. No cranial nerve deficit.  Skin: Skin is warm and dry.  Psychiatric: She has a normal mood and affect. Her behavior is normal. Judgment and thought content normal.  Nursing note and vitals reviewed.         Assessment & Plan:   Acute bronchitis, unspecified organism No Follow-up on file.

## 2015-10-12 ENCOUNTER — Other Ambulatory Visit: Payer: No Typology Code available for payment source

## 2015-10-18 ENCOUNTER — Ambulatory Visit: Payer: No Typology Code available for payment source | Admitting: Family Medicine

## 2015-10-20 ENCOUNTER — Ambulatory Visit (INDEPENDENT_AMBULATORY_CARE_PROVIDER_SITE_OTHER): Payer: No Typology Code available for payment source | Admitting: Family Medicine

## 2015-10-20 ENCOUNTER — Encounter: Payer: Self-pay | Admitting: Family Medicine

## 2015-10-20 VITALS — BP 112/72 | HR 75 | Temp 98.1°F | Wt 158.8 lb

## 2015-10-20 DIAGNOSIS — J42 Unspecified chronic bronchitis: Secondary | ICD-10-CM | POA: Diagnosis not present

## 2015-10-20 DIAGNOSIS — J209 Acute bronchitis, unspecified: Secondary | ICD-10-CM | POA: Insufficient documentation

## 2015-10-20 DIAGNOSIS — R739 Hyperglycemia, unspecified: Secondary | ICD-10-CM

## 2015-10-20 DIAGNOSIS — Z23 Encounter for immunization: Secondary | ICD-10-CM

## 2015-10-20 DIAGNOSIS — E785 Hyperlipidemia, unspecified: Secondary | ICD-10-CM

## 2015-10-20 NOTE — Assessment & Plan Note (Signed)
Stable.  No changes made. 

## 2015-10-20 NOTE — Assessment & Plan Note (Signed)
Stable on low dose Metformin- continue 500 mg daily.  She remains concerned about some higher blood sugar readings.  Discussed diabetic friendly diet.  She will continue to monitor FSBS and keep me updated.

## 2015-10-20 NOTE — Addendum Note (Signed)
Addended by: Modena Nunnery on: 10/20/2015 07:50 AM   Modules accepted: Orders

## 2015-10-20 NOTE — Progress Notes (Signed)
Pre visit review using our clinic review tool, if applicable. No additional management support is needed unless otherwise documented below in the visit note. 

## 2015-10-20 NOTE — Assessment & Plan Note (Signed)
Lung exam reassuring. Advised that cough can often linger. Continue supportive care with prn albuterol and or prn tussionex. Call or return to clinic prn if these symptoms worsen or fail to improve as anticipated. The patient indicates understanding of these issues and agrees with the plan.

## 2015-10-20 NOTE — Progress Notes (Signed)
Subjective:   Patient ID: Ashley [name], female    DOB: Sep 12, 1966, 49 y.o.   MRN: [id number]  Ashley [name] is a pleasant 49 y.o. year old female who presents to clinic today with Follow-up  on 10/20/2015  HPI:  Hyperglycemia- has been on Metformin 500 mg daily for years for pre diabetes.  Still feels like her sugars fluctuate.  Denies any episodes of hypoglycemia.  Fasting FSBS ranging 120s- 150s. Lab Results  Component Value Date   HGBA1C 6.0 10/10/2015   HLD- cholesterol improved this month. Lab Results  Component Value Date   CHOL 187 10/10/2015   HDL 38.40* 10/10/2015   LDLCALC 125* 10/10/2015   LDLDIRECT 152.1 01/19/2013   TRIG 119.0 10/10/2015   CHOLHDL 5 10/10/2015   Lab Results  Component Value Date   ALT 12 10/10/2015   AST 13 10/10/2015   ALKPHOS 111 10/10/2015   BILITOT 0.3 10/10/2015   History of chronic, recurrent bronchitis- saw her on 10/11 for non productive cough, chills. She had wheezes and rales on exam.  Treated with Zpack, flonase and as needed proair. She returned to the office 6 days later, on 10/17, with persistent symptoms. eRx sent for course of prednisone to help decrease inflammation of airways, along with prn tussionex. Cough has persisted.  "I don't feel much better."  Current Outpatient Prescriptions on File Prior to Visit  Medication Sig Dispense Refill  . amitriptyline (ELAVIL) 25 MG tablet Take 1-1/2 tablet at dinner time 45 tablet 3  . clonazePAM (KLONOPIN) 1 MG tablet Take 1 tablet (1 mg total) by mouth 3 (three) times daily as needed for anxiety. 90 tablet 0  . dicyclomine (BENTYL) 10 MG capsule TAKE ONE CAPSULE BY MOUTH TWICE DAILY AS NEEDED FOR CRAMPING AND ABDOMINAL PAIN 60 capsule 0  . estrogens, conjugated, (PREMARIN) 0.3 MG tablet Take 1 tablet (0.3 mg total) by mouth daily. Take daily for 21 days then do not take for 7 days. 30 tablet 3  . meloxicam (MOBIC) 15 MG tablet Take 1 tablet (15 mg total) by mouth daily. Take with food for  low back pain 30 tablet 1  . metFORMIN (GLUCOPHAGE) 500 MG tablet TAKE 1 TABLET DAILY WITH BREAKFAST 90 tablet 1  . omeprazole (PRILOSEC) 40 MG capsule TAKE 1 CAPSULE DAILY 120 capsule 3  . polyethylene glycol powder (GLYCOLAX/MIRALAX) powder Take 17 g by mouth daily. Until daily soft stools  OTC    . promethazine (PHENERGAN) 25 MG tablet Take 1 tablet (25 mg total) by mouth every 6 (six) hours as needed for nausea or vomiting. 30 tablet 0  . rizatriptan (MAXALT-MLT) 10 MG disintegrating tablet Take 1 tablet (10 mg total) by mouth as needed for migraine. May repeat in 2 hours if needed. Do not take more than 3 tablets in a week. 10 tablet 3  . traZODone (DESYREL) 150 MG tablet Take 1 tablet (150 mg total) by mouth at bedtime. 90 tablet 0  . verapamil (CALAN) 40 MG tablet Take 1 tablet daily 30 tablet 4   No current facility-administered medications on file prior to visit.    Allergies  Allergen Reactions  . Penicillins Anaphylaxis    REACTION: Throat swells  . Topamax [Topiramate]     Hands and feet to go numb    Past Medical History  Diagnosis Date  . Anxiety   . Tobacco abuse   . Cataract     bilateral  . SVD (spontaneous vaginal delivery)  x 3  . Bronchitis     uses inhaler if needed for bronchitis, lasted used -2014  . GERD (gastroesophageal reflux disease)   . History of blood transfusion 09[birth year]-1966    at birth in [place], [place], unsure number of units  . Diabetes mellitus without complication ([place])     taken off Metformin since 04/2014, HgbA1C - normal, will follow up with PCP- Dr. Deborra Medina, 07/2014  . H/O exercise stress test 2011    done at [facility]- told that it was WNL, done due to pt. having panic attacks   . Chronic headaches     migraines - in past, uses Phenergan for nausea   . Poor dentition     very poor oral health     Past Surgical History  Procedure Laterality Date  . Appendectomy  1988  . Tubal ligation    . Esophagogastroduodenoscopy  12/28/2013  .  Knee arthroscopy  1995    left  . Colonoscopy  02/16/2014    normal   . Laparoscopic assisted vaginal hysterectomy N/A 04/15/2014    Procedure: LAPAROSCOPIC ASSISTED VAGINAL HYSTERECTOMY;  Surgeon: Marylynn Pearson, MD;  Location: [facility];  Service: Gynecology;  Laterality: N/A;  . Laparoscopic bilateral salpingo oopherectomy Bilateral 04/15/2014    Procedure: LAPAROSCOPIC BILATERAL SALPINGO OOPHORECTOMY;  Surgeon: Marylynn Pearson, MD;  Location: [facility];  Service: Gynecology;  Laterality: Bilateral;  . Abdominal hysterectomy    . Cholecystectomy N/A 06/07/2014    Procedure: LAPAROSCOPIC CHOLECYSTECTOMY;  Surgeon: Ralene Ok, MD;  Location: [facility];  Service: General;  Laterality: N/A;  . Cholecystectomy  June 07 2014    Family History  Problem Relation Age of Onset  . Diabetes Mother   . Hyperlipidemia Mother   . Hypertension Mother   . Colon cancer Neg Hx   . Rectal cancer Neg Hx   . Stomach cancer Neg Hx   . Prostate cancer Father   . Lung cancer Maternal Grandfather     Social History   Social History  . Marital Status: Married    Spouse Name: N/A  . Number of Children: 6  . Years of Education: N/A   Occupational History  .       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 Smoking status: Former Smoker -- 1.00 packs/day for 35 years    Types: Cigarettes, E-cigarettes    Quit date: 02/02/2014  . Smokeless tobacco: Never Used  . Alcohol Use: No  . Drug Use: No  . Sexual Activity: Yes    Birth Control/ Protection: Pill   Other Topics Concern  . Not on file   Social History Narrative   5 living children, one child is a crack cocaine addict who often breaks into their house to steal money. One child died of cerebral palsy.   The PMH, PSH, Social History, Family History, Medications, and allergies have been reviewed in [facility], and have been updated if relevant.   Review of Systems  Constitutional: Negative.   Eyes: Negative.   Respiratory: Positive  for cough. Negative for shortness of breath, wheezing and stridor.   Cardiovascular: Negative.   Gastrointestinal: Negative.   Endocrine: Negative.   Genitourinary: Negative.   Musculoskeletal: Negative.   Skin: Negative.   Allergic/Immunologic: Negative.   Neurological: Negative.   Hematological: Negative.   Psychiatric/Behavioral: Negative.   All other systems reviewed and are negative.      Objective:    BP 112/72 mmHg  Pulse 75  Temp(Src) 98.1 F (36.7 C) (Oral)  Wt 158 lb 12 oz (72.009 kg)  SpO2 96%  LMP 02/12/2014   Physical Exam  Constitutional: She is oriented to person, place, and time. She appears well-developed and well-nourished. No distress.  HENT:  Head: Normocephalic.  Eyes: Conjunctivae are normal.  Neck: Normal range of motion.  Cardiovascular: Normal rate and regular rhythm.   Pulmonary/Chest: Effort normal and breath sounds normal. No respiratory distress. She has no wheezes. She has no rales.  Musculoskeletal: Normal range of motion.  Neurological: She is alert and oriented to person, place, and time. No cranial nerve deficit.  Skin: Skin is warm and dry.  Psychiatric: She has a normal mood and affect. Her behavior is normal. Thought content normal.  Nursing note and vitals reviewed.         Assessment & Plan:   Hyperglycemia  HLD (hyperlipidemia)  Chronic bronchitis, unspecified chronic bronchitis type (HCC) No Follow-up on file.

## 2015-10-23 ENCOUNTER — Other Ambulatory Visit: Payer: Self-pay | Admitting: Family Medicine

## 2015-10-24 NOTE — Telephone Encounter (Signed)
Pt has not taken medication since 10/2014

## 2015-11-02 LAB — HM DIABETES EYE EXAM

## 2015-11-07 ENCOUNTER — Other Ambulatory Visit: Payer: Self-pay | Admitting: Family Medicine

## 2015-11-09 ENCOUNTER — Encounter: Payer: Self-pay | Admitting: Family Medicine

## 2015-11-09 ENCOUNTER — Ambulatory Visit (INDEPENDENT_AMBULATORY_CARE_PROVIDER_SITE_OTHER): Payer: No Typology Code available for payment source | Admitting: Family Medicine

## 2015-11-09 ENCOUNTER — Ambulatory Visit (INDEPENDENT_AMBULATORY_CARE_PROVIDER_SITE_OTHER)
Admission: RE | Admit: 2015-11-09 | Discharge: 2015-11-09 | Disposition: A | Discharge status: Home / Self Care | Payer: No Typology Code available for payment source | Source: Ambulatory Visit | Attending: Family Medicine | Admitting: Family Medicine

## 2015-11-09 VITALS — BP 122/62 | HR 79 | Temp 98.4°F | Wt 158.5 lb

## 2015-11-09 DIAGNOSIS — R059 Cough, unspecified: Secondary | ICD-10-CM | POA: Insufficient documentation

## 2015-11-09 DIAGNOSIS — R05 Cough: Secondary | ICD-10-CM | POA: Diagnosis not present

## 2015-11-09 DIAGNOSIS — R300 Dysuria: Secondary | ICD-10-CM | POA: Diagnosis not present

## 2015-11-09 LAB — POCT URINALYSIS DIPSTICK
BILIRUBIN UA: NEGATIVE
Blood, UA: NEGATIVE
GLUCOSE UA: NEGATIVE
Ketones, UA: NEGATIVE
LEUKOCYTES UA: NEGATIVE
NITRITE UA: NEGATIVE
Protein, UA: NEGATIVE
Spec Grav, UA: >=1.03
Urobilinogen, UA: 0.2
pH, UA: 6

## 2015-11-09 MED ORDER — HYDROCOD POLST-CPM POLST ER 10-8 MG/5ML PO SUER: 5.0000 mL | Freq: Two times a day (BID) | ORAL | Status: DC | PRN

## 2015-11-09 NOTE — Progress Notes (Signed)
Pre visit review using our clinic review tool, if applicable. No additional management support is needed unless otherwise documented below in the visit note. 

## 2015-11-09 NOTE — Assessment & Plan Note (Signed)
UA neg. Discussed bladder irritants.

## 2015-11-09 NOTE — Assessment & Plan Note (Signed)
Persistent. Lung exam reassuring. CXR today. Discussed referral to pulmonary.  She wants to await results from CXR first.

## 2015-11-09 NOTE — Progress Notes (Signed)
Subjective:   Patient ID: Ashley [name], female    DOB: August 22, 1966, 49 y.o.   MRN: [id number]  Ashley [name] is a pleasant 49 y.o. year old female who presents to clinic today with Urinary Frequency; Dysuria; and Cough  on 11/09/2015  HPI:  History of chronic, recurrent bronchitis- saw her on 10/11 for non productive cough, chills. She had wheezes and rales on exam. Treated with Zpack, flonase and as needed proair. She returned to the office 6 days later, on 10/17, with persistent symptoms. eRx sent for course of prednisone to help decrease inflammation of airways, along with prn tussionex.  She then returned on 10/27 stating that "she did not feel any better." Lung exam reassuring.  Advised continue prn tussionex and albuterol.  Today she says that she "still cant shake this cough."  Not smoking.  No fevers.  No hemoptysis.  Has had some intermittent dysuria for past several weeks. No fevers, nausea or vomiting.  No back pain or hematuria.  Current Outpatient Prescriptions on File Prior to Visit  Medication Sig Dispense Refill  . amitriptyline (ELAVIL) 25 MG tablet Take 1-1/2 tablet at dinner time 45 tablet 3  . clonazePAM (KLONOPIN) 1 MG tablet Take 1 tablet (1 mg total) by mouth 3 (three) times daily as needed for anxiety. 90 tablet 0  . dicyclomine (BENTYL) 10 MG capsule TAKE ONE CAPSULE BY MOUTH TWICE DAILY AS NEEDED FOR CRAMPING AND ABDOMINAL PAIN 60 capsule 0  . estrogens, conjugated, (PREMARIN) 0.3 MG tablet Take 1 tablet (0.3 mg total) by mouth daily. Take daily for 21 days then do not take for 7 days. 30 tablet 3  . meloxicam (MOBIC) 15 MG tablet Take 1 tablet (15 mg total) by mouth daily. Take with food for low back pain 30 tablet 1  . metFORMIN (GLUCOPHAGE) 500 MG tablet TAKE 1 TABLET DAILY WITH BREAKFAST 90 tablet 1  . omeprazole (PRILOSEC) 40 MG capsule TAKE 1 CAPSULE DAILY 120 capsule 3  . polyethylene glycol powder (GLYCOLAX/MIRALAX) powder Take 17 g by mouth daily.  Until daily soft stools  OTC    . promethazine (PHENERGAN) 25 MG tablet Take 1 tablet (25 mg total) by mouth every 6 (six) hours as needed for nausea or vomiting. 30 tablet 0  . rizatriptan (MAXALT-MLT) 10 MG disintegrating tablet Take 1 tablet (10 mg total) by mouth as needed for migraine. May repeat in 2 hours if needed. Do not take more than 3 tablets in a week. 10 tablet 3  . traZODone (DESYREL) 150 MG tablet TAKE 1 TABLET AT BEDTIME 90 tablet 0  . verapamil (CALAN) 40 MG tablet TAKE ONE TABLET BY MOUTH DAILY 30 tablet 5   No current facility-administered medications on file prior to visit.    Allergies  Allergen Reactions  . Penicillins Anaphylaxis    REACTION: Throat swells  . Topamax [Topiramate]     Hands and feet to go numb    Past Medical History  Diagnosis Date  . Anxiety   . Tobacco abuse   . Cataract     bilateral  . SVD (spontaneous vaginal delivery)     x 3  . Bronchitis     uses inhaler if needed for bronchitis, lasted used -2014  . GERD (gastroesophageal reflux disease)   . History of blood transfusion 05/31/1966    at birth in [place], [place], unsure number of units  . Diabetes mellitus without complication ([place])     taken off Metformin since 04/2014,  HgbA1C - normal, will follow up with PCP- Dr. Deborra Medina, 07/2014  . H/O exercise stress test 2011    done at [facility]- told that it was WNL, done due to pt. having panic attacks   . Chronic headaches     migraines - in past, uses Phenergan for nausea   . Poor dentition     very poor oral health     Past Surgical History  Procedure Laterality Date  . Appendectomy  1988  . Tubal ligation    . Esophagogastroduodenoscopy  12/28/2013  . Knee arthroscopy  1995    left  . Colonoscopy  02/16/2014    normal   . Laparoscopic assisted vaginal hysterectomy N/A 04/15/2014    Procedure: LAPAROSCOPIC ASSISTED VAGINAL HYSTERECTOMY;  Surgeon: Marylynn Pearson, MD;  Location: [facility];  Service: Gynecology;  Laterality: N/A;  .  Laparoscopic bilateral salpingo oopherectomy Bilateral 04/15/2014    Procedure: LAPAROSCOPIC BILATERAL SALPINGO OOPHORECTOMY;  Surgeon: Marylynn Pearson, MD;  Location: [facility];  Service: Gynecology;  Laterality: Bilateral;  . Abdominal hysterectomy    . Cholecystectomy N/A 06/07/2014    Procedure: LAPAROSCOPIC CHOLECYSTECTOMY;  Surgeon: Ralene Ok, MD;  Location: [facility];  Service: General;  Laterality: N/A;  . Cholecystectomy  June 07 2014    Family History  Problem Relation Age of Onset  . Diabetes Mother   . Hyperlipidemia Mother   . Hypertension Mother   . Colon cancer Neg Hx   . Rectal cancer Neg Hx   . Stomach cancer Neg Hx   . Prostate cancer Father   . Lung cancer Maternal Grandfather     Social History   Social History  . Marital Status: Married    Spouse Name: N/A  . Number of Children: 6  . Years of Education: N/A   Occupational History  .       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 Smoking status: Former Smoker -- 1.00 packs/day for 35 years    Types: Cigarettes, E-cigarettes    Quit date: 02/02/2014  . Smokeless tobacco: Never Used  . Alcohol Use: No  . Drug Use: No  . Sexual Activity: Yes    Birth Control/ Protection: Pill   Other Topics Concern  . Not on file   Social History Narrative   5 living children, one child is a crack cocaine addict who often breaks into their house to steal money. One child died of cerebral palsy.   The PMH, PSH, Social History, Family History, Medications, and allergies have been reviewed in [facility], and have been updated if relevant.     Review of Systems  Constitutional: Negative.   HENT: Negative.   Eyes: Negative.   Respiratory: Positive for cough and wheezing. Negative for apnea, chest tightness, shortness of breath and stridor.   Genitourinary: Positive for dysuria. Negative for urgency, hematuria, decreased urine volume, vaginal bleeding, enuresis, vaginal pain, menstrual problem and pelvic  pain.  All other systems reviewed and are negative.      Objective:    BP 122/62 mmHg  Pulse 79  Temp(Src) 98.4 F (36.9 C) (Oral)  Wt 158 lb 8 oz (71.895 kg)  SpO2 97%  LMP 02/12/2014   Physical Exam  Constitutional: She is oriented to person, place, and time. She appears well-developed and well-nourished. No distress.  HENT:  Head: Normocephalic.  Eyes: Conjunctivae are normal.  Cardiovascular: Normal rate.   Pulmonary/Chest: Effort normal and breath sounds normal. No respiratory distress. She  has no wheezes. She has no rales.  Musculoskeletal: Normal range of motion.  Neurological: She is alert and oriented to person, place, and time. No cranial nerve deficit.  Psychiatric: She has a normal mood and affect. Her behavior is normal. Judgment and thought content normal.  Nursing note and vitals reviewed.         Assessment & Plan:   Dysuria - Plan: Urinalysis Dipstick No Follow-up on file.

## 2015-11-10 ENCOUNTER — Other Ambulatory Visit: Payer: Self-pay | Admitting: Family Medicine

## 2015-11-10 DIAGNOSIS — J42 Unspecified chronic bronchitis: Secondary | ICD-10-CM

## 2015-11-24 ENCOUNTER — Other Ambulatory Visit: Payer: Self-pay

## 2015-11-24 MED ORDER — CLONAZEPAM 1 MG PO TABS: 1.0000 mg | ORAL_TABLET | Freq: Three times a day (TID) | ORAL | Status: DC | PRN

## 2015-11-24 MED ORDER — DICYCLOMINE HCL 10 MG PO CAPS: ORAL_CAPSULE | ORAL | Status: DC

## 2015-11-24 NOTE — Telephone Encounter (Signed)
Note from 10/2014, needs UDS- none since 07/2014 Ok to phone in Clonazepam

## 2015-11-24 NOTE — Telephone Encounter (Signed)
Rx called in to requested pharmacy. Lm on pts vm informing her to RTO to update contract

## 2015-11-24 NOTE — Telephone Encounter (Signed)
Pt left v/m requesting refill clonazepam (last refilled # 90 on 09/26/15) and bentyl (last refilled 3 [phone] on 09/26/15). Last seen f/u 10/20/15. [facility] [place].Please advise.

## 2015-11-29 ENCOUNTER — Other Ambulatory Visit: Payer: Self-pay | Admitting: Neurology

## 2015-12-07 ENCOUNTER — Ambulatory Visit (INDEPENDENT_AMBULATORY_CARE_PROVIDER_SITE_OTHER): Payer: No Typology Code available for payment source | Admitting: Internal Medicine

## 2015-12-07 ENCOUNTER — Encounter: Payer: Self-pay | Admitting: Internal Medicine

## 2015-12-07 DIAGNOSIS — R059 Cough, unspecified: Secondary | ICD-10-CM

## 2015-12-07 DIAGNOSIS — R05 Cough: Secondary | ICD-10-CM

## 2015-12-07 MED ORDER — MOMETASONE FURO-FORMOTEROL FUM 100-5 MCG/ACT IN AERO
INHALATION_SPRAY | RESPIRATORY_TRACT | Status: DC
Start: 2015-12-07 — End: 2015-12-21

## 2015-12-07 MED ORDER — TRAMADOL HCL 50 MG PO TABS
ORAL_TABLET | ORAL | Status: DC
Start: 2015-12-07 — End: 2015-12-21

## 2015-12-07 MED ORDER — PREDNISONE 10 MG PO TABS: ORAL_TABLET | ORAL | Status: DC

## 2015-12-07 NOTE — Patient Instructions (Addendum)
Try dulera 100 Take 2 puffs first thing in am and then another 2 puffs about 12 hours later until return   Work on inhaler technique:  relax and gently blow all the way out then take a nice smooth deep breath back in, triggering the inhaler at same time you start breathing in.  Hold for up to 5 seconds if you can. Blow out thru nose. Rinse and gargle with water when done  Prednisone 10 mg take  4 each am x 2 days,   2 each am x 2 days,  1 each am x 2 days and stop   Take delsym two tsp every 12 hours and supplement if needed with  tramadol 50 mg up to 2 every 4 hours to suppress the urge to cough. Swallowing water or using ice chips/non mint and menthol containing candies (such as lifesavers or sugarless jolly ranchers) are also effective.  You should rest your voice and avoid activities that you know make you cough.  Once you have eliminated the cough for 3 straight days try reducing the tramadol first,  then the delsym as tolerated.    Prilosec Take 30- 60 min before your first and last meals of the day   GERD (REFLUX)  is an extremely common cause of respiratory symptoms just like yours , many times with no obvious heartburn at all.    It can be treated with medication, but also with lifestyle changes including elevation of the head of your bed (ideally with 6 inch  bed blocks),  Smoking cessation, avoidance of late meals, excessive alcohol, and avoid fatty foods, chocolate, peppermint, colas, red wine, and acidic juices such as orange juice.  NO MINT OR MENTHOL PRODUCTS SO NO COUGH DROPS  USE SUGARLESS CANDY INSTEAD (Jolley ranchers or Stover's or Life Savers) or even ice chips will also do - the key is to swallow to prevent all throat clearing. NO OIL BASED VITAMINS - use powdered substitutes.    Please schedule a follow up office visit in 2 weeks, sooner if needed

## 2015-12-07 NOTE — Progress Notes (Signed)
Subjective:    Patient ID: Ashley [name], female    DOB: 10-17-1966,    MRN: [id number]  HPI  66 yowf quit smoking Feb 2015 with a pattern of recurrent cough x 2014 typically resp to abx / prednisone similar spell Sep 30 2015 with cough > dx pna and never felt she recovered so referred to pulmonary clinic 12/07/2015 by DrAron   12/07/2015 1st [facility] office visit/ Talya Quain   Chief Complaint  Patient presents with  . Pulmonary Consult    Referred by Dr. Deborra Medina. Pt c/o cough and SOB since 10/04/15. She rarely produces sputum, but when she does it is light yellow.  She states that is is mainly SOB with exertion, but occ notices at rest. She states that she gets out of breath just walking from room to room at home and sometimes just talking.   cough is worse p stir > min yellow mucus/ doesn't typically wake her in fact does better p gets to sleep No better with albuterol  Was already on prilosec up to an hour before bfast prior to onset of cough  Much Worse with voice use   No obvious other patterns in day to day or daytime variabilty or assoc  cp or chest tightness, subjective wheeze overt sinus or hb symptoms. No unusual exp hx or h/o childhood pna/ asthma or knowledge of premature birth.  Sleeping ok without nocturnal  or early am exacerbation  of respiratory  c/o's or need for noct saba. Also denies any obvious fluctuation of symptoms with weather or environmental changes or other aggravating or alleviating factors except as outlined above   Current Medications, Allergies, Complete Past Medical History, Past Surgical History, Family History, and Social History were reviewed in [employer] record.               Review of Systems  Constitutional: Negative for fever, chills and unexpected weight change.  HENT: Negative for congestion, dental problem, ear pain, nosebleeds, postnasal drip, rhinorrhea, sinus pressure, sneezing, sore throat, trouble swallowing  and voice change.   Eyes: Negative for visual disturbance.  Respiratory: Positive for cough and shortness of breath. Negative for choking.   Cardiovascular: Negative for chest pain and leg swelling.  Gastrointestinal: Negative for vomiting, abdominal pain and diarrhea.  Genitourinary: Negative for difficulty urinating.  Musculoskeletal: Negative for arthralgias.  Skin: Negative for rash.  Neurological: Negative for tremors, syncope and headaches.  Hematological: Does not bruise/bleed easily.       Objective:   Physical Exam  amb wf nad with harsh upper airway coughing fits   Wt Readings from Last 3 Encounters:  12/07/15 158 lb (71.668 kg)  11/09/15 158 lb 8 oz (71.895 kg)  10/20/15 158 lb 12 oz (72.009 kg)    Vital signs reviewed   HEENT: nl   turbinates, and oropharynx. Nl external ear canals without cough reflex - very poor dentition   NECK :  without JVD/Nodes/TM/ nl carotid upstrokes bilaterally   LUNGS: no acc muscle use,  Nl contour chest which is clear to A and P bilaterally with cough mid insp   CV:  RRR  no s3 or murmur or increase in P2, no edema   ABD:  soft and nontender with nl inspiratory excursion in the supine position. No bruits or organomegaly, bowel sounds nl  MS:  Nl gait/ ext warm without deformities, calf tenderness, cyanosis or clubbing No obvious joint restrictions   SKIN: warm and dry without lesions  NEURO:  alert, approp, nl sensorium with  no motor deficits                  Assessment & Plan:

## 2015-12-08 ENCOUNTER — Encounter: Payer: Self-pay | Admitting: Internal Medicine

## 2015-12-08 NOTE — Assessment & Plan Note (Signed)
The most common causes of chronic cough in immunocompetent adults include the following: upper airway cough syndrome (UACS), previously referred to as postnasal drip syndrome (PNDS), which is caused by variety of rhinosinus conditions; (2) asthma; (3) GERD; (4) chronic bronchitis from cigarette smoking or other inhaled environmental irritants; (5) nonasthmatic eosinophilic bronchitis; and (6) bronchiectasis.  These conditions, singly or in combination, have accounted for up to 94% of the causes of chronic cough in prospective studies.   Other conditions have constituted no >6% of the causes in prospective studies These have included bronchogenic carcinoma, chronic interstitial pneumonia, sarcoidosis, left ventricular failure, ACEI-induced cough, and aspiration from a condition associated with pharyngeal dysfunction.    Chronic cough is often simultaneously caused by more than one condition. A single cause has been found from 38 to 82% of the time, multiple causes from 18 to 62%. Multiply caused cough has been the result of three diseases up to 42% of the time.       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   These are the same pts (now being labeled as having "irritable larynx syndrome" by some cough centers) who not infrequently have a history of having failed to tolerate ace inhibitors,  dry powder inhalers or biphosphonates or report having atypical reflux symptoms that don't respond to standard doses of PPI , and  are easily confused as having aecopd or asthma flares by even experienced allergists/ pulmonologists.   The first step is to maximize acid suppression and eliminate cyclical coughing while using the lowest dose of ICS/LABA then regroup in 2 weeks  I had an extended discussion with the patient reviewing all relevant studies completed to date and  lasting 35 min/ 60 min    1) Each maintenance medication was reviewed in detail including most importantly the difference between maintenance and prns and under what circumstances the prns are to be triggered using an action plan format that is not reflected in the computer generated alphabetically organized AVS.    Please see instructions for details which were reviewed in writing and the patient given a copy highlighting the part that I personally wrote and discussed at today's ov.   See instructions for specific recommendations which were reviewed directly with the patient who was given a copy with highlighter outlining the key components.

## 2015-12-21 ENCOUNTER — Encounter: Payer: Self-pay | Admitting: Internal Medicine

## 2015-12-21 ENCOUNTER — Ambulatory Visit (INDEPENDENT_AMBULATORY_CARE_PROVIDER_SITE_OTHER): Payer: No Typology Code available for payment source | Admitting: Internal Medicine

## 2015-12-21 VITALS — BP 106/64 | HR 81 | Ht 62.0 in | Wt 159.0 lb

## 2015-12-21 DIAGNOSIS — R0600 Dyspnea, unspecified: Secondary | ICD-10-CM

## 2015-12-21 DIAGNOSIS — R05 Cough: Secondary | ICD-10-CM | POA: Diagnosis not present

## 2015-12-21 DIAGNOSIS — R059 Cough, unspecified: Secondary | ICD-10-CM

## 2015-12-21 MED ORDER — FLUTTER DEVI: Status: DC

## 2015-12-21 MED ORDER — PREDNISONE 10 MG PO TABS: ORAL_TABLET | ORAL | Status: DC

## 2015-12-21 MED ORDER — TRAMADOL HCL 50 MG PO TABS: ORAL_TABLET | ORAL | Status: DC

## 2015-12-21 NOTE — Patient Instructions (Addendum)
Stop dulera as this does not appear to be related to asthma and you do not have copd   Prednisone 10 mg take  4 each am x 2 days,   2 each am x 2 days,  1 each am x 2 days and stop   Take delsym two tsp every 12 hours and supplement if needed with  tramadol 50 mg up to 2 every 4 hours to suppress the urge to cough. Swallowing water or using ice chips/non mint and menthol containing candies (such as lifesavers or sugarless jolly ranchers) are also effective.  You should rest your voice and avoid activities that you know make you cough.  Once you have eliminated the cough for 3 straight days try reducing the tramadol first,  then the delsym as tolerated.    For drainage / throat tickle try take CHLORPHENIRAMINE  4 mg - take one every 4 hours as needed - available over the counter- may cause drowsiness so start with just a bedtime dose or two and see how you tolerate it before trying in daytime    Prilosec Take 30- 60 min before your first and last meals of the day   GERD (REFLUX)  is an extremely common cause of respiratory symptoms just like yours , many times with no obvious heartburn at all.    It can be treated with medication, but also with lifestyle changes including elevation of the head of your bed (ideally with 6 inch  bed blocks),  Smoking cessation, avoidance of late meals, excessive alcohol, and avoid fatty foods, chocolate, peppermint, colas, red wine, and acidic juices such as orange juice.  NO MINT OR MENTHOL PRODUCTS SO NO COUGH DROPS  USE SUGARLESS CANDY INSTEAD (Jolley ranchers or Stover's or Life Savers) or even ice chips will also do - the key is to swallow to prevent all throat clearing. NO OIL BASED VITAMINS - use powdered substitutes.    Please schedule a follow up office visit in 2 weeks with all active medications in your hand , sooner if needed

## 2015-12-21 NOTE — Assessment & Plan Note (Signed)
12/21/2015   Walked RA x one lap @ 185 stopped due to End of study, nl pace, no sob or desat   - spirometry 12/21/2015 no obst    No evidence at all of copd and I doubt she even has asthma > try off dulera

## 2015-12-21 NOTE — Assessment & Plan Note (Addendum)
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   These are the same pts (now being labeled as having "irritable larynx syndrome" by some cough centers) who not infrequently have a history of having failed to tolerate ace inhibitors,  dry powder inhalers or biphosphonates or report having atypical reflux symptoms that don't respond to standard doses of PPI , and are easily confused as having aecopd or asthma flares by even experienced allergists/ pulmonologists.   Next step is try to eliminate cyclical cough with flutter valve and tramadol while max rx for gerd/airways inflammation with short course pred but avoid further ics as she's convinced not helping   I had an extended discussion with the patient reviewing all relevant studies completed to date and  lasting 15 to 20 minutes of a 25 minute visit    Each maintenance medication was reviewed in detail including most importantly the difference between maintenance and prns and under what circumstances the prns are to be triggered using an action plan format that is not reflected in the computer generated alphabetically organized AVS.    Please see instructions for details which were reviewed in writing and the patient given a copy highlighting the part that I personally wrote and discussed at today's ov.

## 2015-12-21 NOTE — Progress Notes (Signed)
Subjective:    Patient ID: Ashley [name], female    DOB: 30-Sep-1966,    MRN: [id number]    Brief patient profile:  60 yowf quit smoking Feb 2015 with a pattern of recurrent cough x 2014 typically resp to abx / prednisone similar spell Sep 30 2015 with cough > dx pna and never felt she recovered so referred to pulmonary clinic 12/07/2015 by DrAron   History of Present Illness  12/07/2015 1st [facility] office visit/ Wert   Chief Complaint  Patient presents with  . Pulmonary Consult    Referred by Dr. Deborra Medina. Pt c/o cough and SOB since 10/04/15. She rarely produces sputum, but when she does it is light yellow.  She states that is is mainly SOB with exertion, but occ notices at rest. She states that she gets out of breath just walking from room to room at home and sometimes just talking.   cough is worse p stir > min yellow mucus/ doesn't typically wake her in fact does better p gets to sleep No better with albuterol  Was already on prilosec up to an hour before bfast prior to onset of cough  Much Worse with voice use rec Try dulera 100 Take 2 puffs first thing in am and then another 2 puffs about 12 hours later until return  Work on inhaler technique:  Prednisone 10 mg take  4 each am x 2 days,   2 each am x 2 days,  1 each am x 2 days and stop  Take delsym two tsp every 12 hours and supplement if needed  Once you have eliminated the cough for 3 straight days try reducing the tramadol first,  then the delsym as tolerated.   Prilosec Take 30- 60 min before your first and last meals of the day  GERD diet    12/21/2015  f/u ov/Wert re: refractory cough  Chief Complaint  Patient presents with  . Follow-up    Cough is unchanged. Her breathing is no better or worse. No new co's today.   doe x 100 ft walking flat at nl pace Cough starts w/in a few min stirring and with voice use  No obvious day to day or daytime variability or assoc excess/ purulent sputum or mucus plugs   or cp or  chest tightness, subjective wheeze or overt sinus or hb symptoms. No unusual exp hx or h/o childhood pna/ asthma or knowledge of premature birth.  Sleeping ok without nocturnal  or early am exacerbation  of respiratory  c/o's or need for noct saba. Also denies any obvious fluctuation of symptoms with weather or environmental changes or other aggravating or alleviating factors except as outlined above   Current Medications, Allergies, Complete Past Medical History, Past Surgical History, Family History, and Social History were reviewed in [employer] record.  ROS  The following are not active complaints unless bolded sore throat, dysphagia, dental problems, itching, sneezing,  nasal congestion or excess/ purulent secretions, ear ache,   fever, chills, sweats, unintended wt loss, classically pleuritic or exertional cp, hemoptysis,  orthopnea pnd or leg swelling, presyncope, palpitations, abdominal pain, anorexia, nausea, vomiting, diarrhea  or change in bowel or bladder habits, change in stools or urine, dysuria,hematuria,  rash, arthralgias, visual complaints, headache, numbness, weakness or ataxia or problems with walking or coordination,  change in mood/affect or memory.  Objective:   Physical Exam  amb wf nad with  Less harsh but persistent classic  upper airway coughing fits / psuedowheeze resolves with plm    12/21/2015       159   12/07/15 158 lb (71.668 kg)  11/09/15 158 lb 8 oz (71.895 kg)  10/20/15 158 lb 12 oz (72.009 kg)    Vital signs reviewed   HEENT: nl   turbinates, and oropharynx. Nl external ear canals without cough reflex - very poor dentition   NECK :  without JVD/Nodes/TM/ nl carotid upstrokes bilaterally   LUNGS: no acc muscle use,  Nl contour chest which is clear to A and P bilaterally with no longer cough on deep insp    CV:  RRR  no s3 or murmur or increase in P2, no edema   ABD:  soft and nontender with nl  inspiratory excursion in the supine position. No bruits or organomegaly, bowel sounds nl  MS:  Nl gait/ ext warm without deformities, calf tenderness, cyanosis or clubbing No obvious joint restrictions   SKIN: warm and dry without lesions    NEURO:  alert, approp, nl sensorium with  no motor deficits        I personally reviewed images and agree with radiology impression as follows:  CXR:  11/09/15 Borderline hyperinflation consistent with chronic bronchitic change. There is no evidence of pneumonia, CHF, nor other acute cardiopulmonary abnormality.     Labs ordered:  Cbc with diff / allergy profile       Assessment & Plan:

## 2015-12-22 ENCOUNTER — Encounter: Payer: Self-pay | Admitting: Internal Medicine

## 2015-12-30 ENCOUNTER — Ambulatory Visit (INDEPENDENT_AMBULATORY_CARE_PROVIDER_SITE_OTHER)
Admission: RE | Admit: 2015-12-30 | Discharge: 2015-12-30 | Disposition: A | Discharge status: Home / Self Care | Payer: BLUE CROSS/BLUE SHIELD | Source: Ambulatory Visit | Attending: Internal Medicine | Admitting: Internal Medicine

## 2015-12-30 DIAGNOSIS — R059 Cough, unspecified: Secondary | ICD-10-CM

## 2015-12-30 DIAGNOSIS — R05 Cough: Secondary | ICD-10-CM | POA: Diagnosis not present

## 2016-01-02 NOTE — Progress Notes (Signed)
Quick Note:  Spoke with pt and notified of results per Dr. Wert. Pt verbalized understanding and denied any questions.  ______ 

## 2016-01-03 ENCOUNTER — Other Ambulatory Visit: Payer: Self-pay

## 2016-01-03 MED ORDER — PROMETHAZINE HCL 25 MG PO TABS: 25.0000 mg | ORAL_TABLET | Freq: Four times a day (QID) | ORAL | Status: DC | PRN

## 2016-01-03 MED ORDER — METFORMIN HCL 500 MG PO TABS: 500.0000 mg | ORAL_TABLET | Freq: Every day | ORAL | Status: DC

## 2016-01-03 NOTE — Telephone Encounter (Signed)
Pt request refill promethazine and metformin (done) to [facility]. Promethazine last filled # 30 on 09/26/15. Last seen 10/20/15.Please advise.

## 2016-01-04 ENCOUNTER — Ambulatory Visit: Payer: No Typology Code available for payment source | Admitting: Internal Medicine

## 2016-01-16 ENCOUNTER — Other Ambulatory Visit: Payer: Self-pay | Admitting: Internal Medicine

## 2016-01-20 ENCOUNTER — Other Ambulatory Visit: Payer: Self-pay | Admitting: Family Medicine

## 2016-01-31 ENCOUNTER — Other Ambulatory Visit: Payer: Self-pay

## 2016-01-31 MED ORDER — CLONAZEPAM 1 MG PO TABS: 1.0000 mg | ORAL_TABLET | Freq: Three times a day (TID) | ORAL | Status: DC | PRN

## 2016-01-31 MED ORDER — TRAZODONE HCL 150 MG PO TABS: 150.0000 mg | ORAL_TABLET | Freq: Every day | ORAL | Status: DC

## 2016-01-31 NOTE — Telephone Encounter (Signed)
Pt left v/m requesting refill clonazepam(last refilled # 90 on 11/24/2015) and trazodone ([facility] # [facility]);[facility] but [facility] to [facility]. Unable to reach pt by phone to see if [facility]. Last seen for f/u 10/20/15.

## 2016-01-31 NOTE — Telephone Encounter (Signed)
Rx called in to requested pharmacy 

## 2016-02-06 ENCOUNTER — Ambulatory Visit (INDEPENDENT_AMBULATORY_CARE_PROVIDER_SITE_OTHER): Payer: BLUE CROSS/BLUE SHIELD | Admitting: Family Medicine

## 2016-02-06 ENCOUNTER — Encounter: Payer: Self-pay | Admitting: Family Medicine

## 2016-02-06 VITALS — BP 124/68 | HR 82 | Temp 98.1°F | Wt 163.0 lb

## 2016-02-06 DIAGNOSIS — R221 Localized swelling, mass and lump, neck: Secondary | ICD-10-CM | POA: Diagnosis not present

## 2016-02-06 LAB — COMPREHENSIVE METABOLIC PANEL WITH GFR
ALT: 13 U/L (ref 0–35)
AST: 14 U/L (ref 0–37)
Albumin: 4.1 g/dL (ref 3.5–5.2)
Alkaline Phosphatase: 88 U/L (ref 39–117)
BUN: 7 mg/dL (ref 6–23)
CO2: 27 meq/L (ref 19–32)
Calcium: 9.5 mg/dL (ref 8.4–10.5)
Chloride: 106 meq/L (ref 96–112)
Creatinine, Ser: 0.85 mg/dL (ref 0.40–1.20)
GFR: 75.23 mL/min
Glucose, Bld: 134 mg/dL — ABNORMAL HIGH (ref 70–99)
Potassium: 3.7 meq/L (ref 3.5–5.1)
Sodium: 141 meq/L (ref 135–145)
Total Bilirubin: 0.3 mg/dL (ref 0.2–1.2)
Total Protein: 7.1 g/dL (ref 6.0–8.3)

## 2016-02-06 LAB — CBC WITH DIFFERENTIAL/PLATELET
BASOS PCT: 0.7 % (ref 0.0–3.0)
Basophils Absolute: 0.1 10*3/uL (ref 0.0–0.1)
EOS ABS: 0.3 10*3/uL (ref 0.0–0.7)
EOS PCT: 3.2 % (ref 0.0–5.0)
HCT: 39.1 % (ref 36.0–46.0)
HEMOGLOBIN: 13 g/dL (ref 12.0–15.0)
LYMPHS ABS: 2.7 10*3/uL (ref 0.7–4.0)
Lymphocytes Relative: 33.1 % (ref 12.0–46.0)
MCHC: 33.3 g/dL (ref 30.0–36.0)
MCV: 93.9 fl (ref 78.0–100.0)
MONO ABS: 0.5 10*3/uL (ref 0.1–1.0)
Monocytes Relative: 6.3 % (ref 3.0–12.0)
NEUTROS PCT: 56.7 % (ref 43.0–77.0)
Neutro Abs: 4.6 10*3/uL (ref 1.4–7.7)
Platelets: 375 10*3/uL (ref 150.0–400.0)
RBC: 4.17 Mil/uL (ref 3.87–5.11)
RDW: 13.6 % (ref 11.5–15.5)
WBC: 8.1 10*3/uL (ref 4.0–10.5)

## 2016-02-06 NOTE — Progress Notes (Signed)
Pre visit review using our clinic review tool, if applicable. No additional management support is needed unless otherwise documented below in the visit note. 

## 2016-02-06 NOTE — Progress Notes (Signed)
Subjective:   Patient ID: Ashley [name], female    DOB: [birth year], 50 y.o.   MRN: [id number]  Ashley [name] is a pleasant 50 y.o. year old female who presents to clinic today with check knot on left side of neck  on 02/06/2016  HPI:  Mass on left side of neck- very tender, hurts to swallow. Noticed it a few days ago. Feels it may be getting larger.  No difficulty swallowing but it is painful.  No fevers.  She feels she is constantly cold.  Has never had anything like this before.  Not currently smoking.  Lab Results  Component Value Date   TSH 2.07 04/07/2015   Lab Results  Component Value Date   WBC 8.4 04/25/2015   HGB 12.2 04/25/2015   HCT 36.2 04/25/2015   MCV 93.5 04/25/2015   PLT 374 04/25/2015    Current Outpatient Prescriptions on File Prior to Visit  Medication Sig Dispense Refill  . amitriptyline (ELAVIL) 25 MG tablet Take 1-1/2 tablet at dinner time 45 tablet 3  . clonazePAM (KLONOPIN) 1 MG tablet Take 1 tablet (1 mg total) by mouth 3 (three) times daily as needed for anxiety. 90 tablet 0  . dextromethorphan (DELSYM) 30 MG/5ML liquid 2 tsp every 12 hours as needed    . dicyclomine (BENTYL) 10 MG capsule TAKE ONE CAPSULE BY MOUTH TWICE DAILY AS NEEDED FOR  CRAMPING  AND  ABDOMINAL  PAIN 60 capsule 0  . estrogens, conjugated, (PREMARIN) 0.3 MG tablet Take 1 tablet (0.3 mg total) by mouth daily. Take daily for 21 days then do not take for 7 days. 30 tablet 3  . meloxicam (MOBIC) 15 MG tablet Take 1 tablet (15 mg total) by mouth daily. Take with food for low back pain 30 tablet 1  . metFORMIN (GLUCOPHAGE) 500 MG tablet Take 1 tablet (500 mg total) by mouth daily with breakfast. 90 tablet 1  . omeprazole (PRILOSEC) 20 MG capsule Take 20 mg by mouth 2 (two) times daily at 8 am and 10 pm.    . polyethylene glycol powder (GLYCOLAX/MIRALAX) powder Take 17 g by mouth daily. Until daily soft stools  OTC    . promethazine (PHENERGAN) 25 MG tablet Take 1 tablet (25 mg total)  by mouth every 6 (six) hours as needed for nausea or vomiting. 30 tablet 0  . Respiratory Therapy Supplies (FLUTTER) DEVI Use as directed 1 each 0  . rizatriptan (MAXALT-MLT) 10 MG disintegrating tablet Take 1 tablet (10 mg total) by mouth as needed for migraine. May repeat in 2 hours if needed. Do not take more than 3 tablets in a week. 10 tablet 3  . traMADol (ULTRAM) 50 MG tablet 1-2 every 4 hours as needed for cough or pain 40 tablet 0  . traZODone (DESYREL) 150 MG tablet Take 1 tablet (150 mg total) by mouth at bedtime. 90 tablet 1  . verapamil (CALAN) 40 MG tablet TAKE ONE TABLET BY MOUTH DAILY 30 tablet 5   No current facility-administered medications on file prior to visit.    Allergies  Allergen Reactions  . Penicillins Anaphylaxis    REACTION: Throat swells  . Topamax [Topiramate]     Hands and feet to go numb    Past Medical History  Diagnosis Date  . Anxiety   . Tobacco abuse   . Cataract     bilateral  . SVD (spontaneous vaginal delivery)     x 3  . Bronchitis  uses inhaler if needed for bronchitis, lasted used -2014  . GERD (gastroesophageal reflux disease)   . History of blood transfusion 1966-11-24    at birth in [place], [place], unsure number of units  . Diabetes mellitus without complication ([place])     taken off Metformin since 04/2014, HgbA1C - normal, will follow up with PCP- Dr. Deborra Medina, 07/2014  . H/O exercise stress test 2011    done at [facility]- told that it was WNL, done due to pt. having panic attacks   . Chronic headaches     migraines - in past, uses Phenergan for nausea   . Poor dentition     very poor oral health     Past Surgical History  Procedure Laterality Date  . Appendectomy  1988  . Tubal ligation    . Esophagogastroduodenoscopy  12/28/2013  . Knee arthroscopy  1995    left  . Colonoscopy  02/16/2014    normal   . Laparoscopic assisted vaginal hysterectomy N/A 04/15/2014    Procedure: LAPAROSCOPIC ASSISTED VAGINAL HYSTERECTOMY;   Surgeon: Marylynn Pearson, MD;  Location: [facility];  Service: Gynecology;  Laterality: N/A;  . Laparoscopic bilateral salpingo oopherectomy Bilateral 04/15/2014    Procedure: LAPAROSCOPIC BILATERAL SALPINGO OOPHORECTOMY;  Surgeon: Marylynn Pearson, MD;  Location: [facility];  Service: Gynecology;  Laterality: Bilateral;  . Abdominal hysterectomy    . Cholecystectomy N/A 06/07/2014    Procedure: LAPAROSCOPIC CHOLECYSTECTOMY;  Surgeon: Ralene Ok, MD;  Location: [facility];  Service: General;  Laterality: N/A;  . Cholecystectomy  June 07 2014    Family History  Problem Relation Age of Onset  . Diabetes Mother   . Hyperlipidemia Mother   . Hypertension Mother   . Colon cancer Neg Hx   . Rectal cancer Neg Hx   . Stomach cancer Neg Hx   . Prostate cancer Father   . Lung cancer Maternal Grandfather     smoked  . Emphysema Maternal Grandfather     smoked  . COPD Maternal Grandmother     never smoked   . Prostate cancer Father   . Thyroid cancer Paternal Aunt     Social History   Social History  . Marital Status: Married    Spouse Name: N/A  . Number of Children: 6  . Years of Education: N/A   Occupational History  .       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 Smoking status: Former Smoker -- 1.50 packs/day for 35 years    Types: Cigarettes, E-cigarettes    Quit date: 02/02/2014  . Smokeless tobacco: Never Used  . Alcohol Use: No  . Drug Use: No  . Sexual Activity: Yes    Birth Control/ Protection: Pill   Other Topics Concern  . Not on file   Social History Narrative   5 living children, one child is a crack cocaine addict who often breaks into their house to steal money. One child died of cerebral palsy.   The PMH, PSH, Social History, Family History, Medications, and allergies have been reviewed in [facility], and have been updated if relevant.  Review of Systems  Constitutional: Negative for fever.  HENT: Positive for dental problem. Negative for  congestion, drooling, ear discharge, ear pain, facial swelling, mouth sores, nosebleeds, postnasal drip, rhinorrhea, sinus pressure, sneezing, sore throat, tinnitus, trouble swallowing and voice change.   Endocrine: Positive for cold intolerance.  Skin: Negative.   Neurological: Negative.   Hematological: Positive for  adenopathy.  All other systems reviewed and are negative.      Objective:    BP 124/68 mmHg  Pulse 82  Temp(Src) 98.1 F (36.7 C) (Oral)  Wt 163 lb (73.936 kg)  SpO2 96%  LMP 02/12/2014   Physical Exam  Constitutional: She is oriented to person, place, and time. She appears well-developed and well-nourished. No distress.  HENT:  Poor dentition  Eyes: Conjunctivae are normal.  Neck: No JVD present. No tracheal deviation present. Thyromegaly present.  Cardiovascular: Normal rate.   Pulmonary/Chest: Effort normal. No stridor.  Musculoskeletal: Normal range of motion.  Lymphadenopathy:    She has cervical adenopathy.  Neurological: She is alert and oriented to person, place, and time. No cranial nerve deficit.  Skin: Skin is warm and dry. She is not diaphoretic.  Psychiatric: She has a normal mood and affect. Her behavior is normal. Thought content normal.  Nursing note and vitals reviewed.         Assessment & Plan:   Mass of left side of neck - Plan: CT Soft Tissue Neck W Contrast, CBC with Differential/Platelet, Comprehensive metabolic panel No Follow-up on file.

## 2016-02-06 NOTE — Assessment & Plan Note (Signed)
New- unclear etiology at this point. ? Lymphadenitis but she is also tender over her thyroid. Will order labs, CT of neck for further evaluation. The patient indicates understanding of these issues and agrees with the plan.  Orders Placed This Encounter  Procedures  . CT Soft Tissue Neck W Contrast  . CBC with Differential/Platelet  . Comprehensive metabolic panel  . TSH

## 2016-02-06 NOTE — Patient Instructions (Signed)
Good to see you. Please stop by to see Ashley [name] on your way out. 

## 2016-02-07 ENCOUNTER — Ambulatory Visit (INDEPENDENT_AMBULATORY_CARE_PROVIDER_SITE_OTHER)
Admission: RE | Admit: 2016-02-07 | Discharge: 2016-02-07 | Disposition: A | Discharge status: Home / Self Care | Payer: BLUE CROSS/BLUE SHIELD | Source: Ambulatory Visit | Attending: Family Medicine | Admitting: Family Medicine

## 2016-02-07 DIAGNOSIS — R221 Localized swelling, mass and lump, neck: Secondary | ICD-10-CM

## 2016-02-07 MED ORDER — IOHEXOL 300 MG/ML  SOLN
75.0000 mL | Freq: Once | INTRAMUSCULAR | Status: AC | PRN
  Administered 2016-02-07: 75 mL via INTRAVENOUS

## 2016-02-15 ENCOUNTER — Telehealth: Payer: Self-pay | Admitting: Family Medicine

## 2016-02-15 DIAGNOSIS — G44011 Episodic cluster headache, intractable: Secondary | ICD-10-CM

## 2016-02-15 DIAGNOSIS — R51 Headache: Principal | ICD-10-CM

## 2016-02-15 DIAGNOSIS — R519 Headache, unspecified: Secondary | ICD-10-CM

## 2016-02-15 MED ORDER — AMITRIPTYLINE HCL 25 MG PO TABS: ORAL_TABLET | ORAL | Status: DC

## 2016-02-15 NOTE — Telephone Encounter (Signed)
Pt LM on triage phone requesting refill on amitriptyline (ELAVIL) 25 MG tablet [id number].  Pt last seen on 02/06/16.  [facility].  Best number to call pt is [phone]

## 2016-02-15 NOTE — Telephone Encounter (Signed)
Please enter as refill request. 

## 2016-02-15 NOTE — Telephone Encounter (Signed)
Rx sent to requested pharmacy

## 2016-02-17 MED ORDER — AMITRIPTYLINE HCL 25 MG PO TABS
ORAL_TABLET | ORAL | Status: DC
Start: 2016-02-17 — End: 2016-03-26

## 2016-02-17 NOTE — Telephone Encounter (Signed)
Pt called to ck on amitriptyline refill to [facility]; rx was sent to [facility]. Pt request sent to [facility]. Spoke with Roselyn Reef at [facility] and cancelled rx and sent rx to [facility] as requested. Pt voiced understanding.

## 2016-02-17 NOTE — Addendum Note (Signed)
Addended by: Helene Shoe on: 02/17/2016 10:20 AM   Modules accepted: Orders

## 2016-03-14 ENCOUNTER — Encounter: Payer: Self-pay | Admitting: Primary Care

## 2016-03-14 ENCOUNTER — Ambulatory Visit (INDEPENDENT_AMBULATORY_CARE_PROVIDER_SITE_OTHER): Payer: BLUE CROSS/BLUE SHIELD | Admitting: Primary Care

## 2016-03-14 VITALS — BP 110/66 | HR 76 | Temp 98.7°F | Ht 62.0 in | Wt 160.8 lb

## 2016-03-14 DIAGNOSIS — G43901 Migraine, unspecified, not intractable, with status migrainosus: Secondary | ICD-10-CM

## 2016-03-14 DIAGNOSIS — G43119 Migraine with aura, intractable, without status migrainosus: Secondary | ICD-10-CM

## 2016-03-14 MED ORDER — KETOROLAC TROMETHAMINE 60 MG/2ML IM SOLN
60.0000 mg | Freq: Once | INTRAMUSCULAR | Status: AC
  Administered 2016-03-14: 60 mg via INTRAMUSCULAR

## 2016-03-14 MED ORDER — PROMETHAZINE HCL 25 MG/ML IJ SOLN
25.0000 mg | Freq: Once | INTRAMUSCULAR | Status: AC
  Administered 2016-03-14: 25 mg via INTRAMUSCULAR

## 2016-03-14 MED ORDER — RIZATRIPTAN BENZOATE 10 MG PO TBDP: 10.0000 mg | ORAL_TABLET | ORAL | Status: DC | PRN

## 2016-03-14 NOTE — Patient Instructions (Signed)
I sent refills of your Maxalt to [facility] at [facility].  You were provided with injections of Phenergan 25 mg and Toradol 60 mg today.   Please schedule a follow up appointment with PCP to discuss migraine management.  It was a pleasure meeting you!

## 2016-03-14 NOTE — Progress Notes (Signed)
Subjective:    Patient ID: Ashley [name], female    DOB: [birth year], 50 y.o.   MRN: [id number]  HPI  Ashley [name] is a 50 year old female who presents today with a chief complaint of migraine. She is currently managed on Amitriptyline 25 mg, Phenergan, Maxalt, and Verapamil for frequent cluster headaches and migraine treatment.  Her migraine is located behind her right eye with radiation to her temporal and occipital lobes. This migraine has been present for 3 days. She's experiencing photophobia, phonophobia, dizziness, and nausea. She's been taking her medications as prescribed and has been experiencing her migraines once weekly on average for the past 1-2 months. She is out of Maxalt and is requesting refills. She's not taken anything this morning for her migraine and she has a driver present with her today.   Review of Systems  Eyes: Positive for photophobia.  Gastrointestinal: Positive for nausea. Negative for vomiting.  Neurological: Positive for headaches.       Past Medical History  Diagnosis Date  . Anxiety   . Tobacco abuse   . Cataract     bilateral  . SVD (spontaneous vaginal delivery)     x 3  . Bronchitis     uses inhaler if needed for bronchitis, lasted used -2014  . GERD (gastroesophageal reflux disease)   . History of blood transfusion Dec 14, 1966    at birth in [place], [place], unsure number of units  . Diabetes mellitus without complication ([place])     taken off Metformin since 04/2014, HgbA1C - normal, will follow up with PCP- Dr. Deborra Medina, 07/2014  . H/O exercise stress test 2011    done at [facility]- told that it was WNL, done due to pt. having panic attacks   . Chronic headaches     migraines - in past, uses Phenergan for nausea   . Poor dentition     very poor oral health     Social History   Social History  . Marital Status: Married    Spouse Name: N/A  . Number of Children: 6  . Years of Education: N/A   Occupational History  .       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 Smoking status: Former Smoker -- 1.50 packs/day for 35 years    Types: Cigarettes, E-cigarettes    Quit date: 02/02/2014  . Smokeless tobacco: Never Used  . Alcohol Use: No  . Drug Use: No  . Sexual Activity: Yes    Birth Control/ Protection: Pill   Other Topics Concern  . Not on file   Social History Narrative   5 living children, one child is a crack cocaine addict who often breaks into their house to steal money. One child died of cerebral palsy.    Past Surgical History  Procedure Laterality Date  . Appendectomy  1988  . Tubal ligation    . Esophagogastroduodenoscopy  12/28/2013  . Knee arthroscopy  1995    left  . Colonoscopy  02/16/2014    normal   . Laparoscopic assisted vaginal hysterectomy N/A 04/15/2014    Procedure: LAPAROSCOPIC ASSISTED VAGINAL HYSTERECTOMY;  Surgeon: Marylynn Pearson, MD;  Location: [facility];  Service: Gynecology;  Laterality: N/A;  . Laparoscopic bilateral salpingo oopherectomy Bilateral 04/15/2014    Procedure: LAPAROSCOPIC BILATERAL SALPINGO OOPHORECTOMY;  Surgeon: Marylynn Pearson, MD;  Location: [facility];  Service: Gynecology;  Laterality: Bilateral;  . Abdominal hysterectomy    . Cholecystectomy N/A  06/07/2014    Procedure: LAPAROSCOPIC CHOLECYSTECTOMY;  Surgeon: Ralene Ok, MD;  Location: [facility];  Service: General;  Laterality: N/A;  . Cholecystectomy  June 07 2014    Family History  Problem Relation Age of Onset  . Diabetes Mother   . Hyperlipidemia Mother   . Hypertension Mother   . Colon cancer Neg Hx   . Rectal cancer Neg Hx   . Stomach cancer Neg Hx   . Prostate cancer Father   . Lung cancer Maternal Grandfather     smoked  . Emphysema Maternal Grandfather     smoked  . COPD Maternal Grandmother     never smoked   . Prostate cancer Father   . Thyroid cancer Paternal Aunt     Allergies  Allergen Reactions  . Penicillins Anaphylaxis    REACTION: Throat swells  . Topamax  [Topiramate]     Hands and feet to go numb    Current Outpatient Prescriptions on File Prior to Visit  Medication Sig Dispense Refill  . amitriptyline (ELAVIL) 25 MG tablet Take 1-1/2 tablet at dinner time 45 tablet 1  . chlorpheniramine (CHLOR-TRIMETON) 4 MG tablet Take 4 mg by mouth daily.    . clonazePAM (KLONOPIN) 1 MG tablet Take 1 tablet (1 mg total) by mouth 3 (three) times daily as needed for anxiety. 90 tablet 0  . dextromethorphan (DELSYM) 30 MG/5ML liquid 2 tsp every 12 hours as needed    . dicyclomine (BENTYL) 10 MG capsule TAKE ONE CAPSULE BY MOUTH TWICE DAILY AS NEEDED FOR  CRAMPING  AND  ABDOMINAL  PAIN 60 capsule 0  . estrogens, conjugated, (PREMARIN) 0.3 MG tablet Take 1 tablet (0.3 mg total) by mouth daily. Take daily for 21 days then do not take for 7 days. 30 tablet 3  . meloxicam (MOBIC) 15 MG tablet Take 1 tablet (15 mg total) by mouth daily. Take with food for low back pain 30 tablet 1  . metFORMIN (GLUCOPHAGE) 500 MG tablet Take 1 tablet (500 mg total) by mouth daily with breakfast. 90 tablet 1  . omeprazole (PRILOSEC) 20 MG capsule Take 20 mg by mouth 2 (two) times daily at 8 am and 10 pm.    . polyethylene glycol powder (GLYCOLAX/MIRALAX) powder Take 17 g by mouth daily. Until daily soft stools  OTC    . promethazine (PHENERGAN) 25 MG tablet Take 1 tablet (25 mg total) by mouth every 6 (six) hours as needed for nausea or vomiting. 30 tablet 0  . Respiratory Therapy Supplies (FLUTTER) DEVI Use as directed 1 each 0  . traMADol (ULTRAM) 50 MG tablet 1-2 every 4 hours as needed for cough or pain 40 tablet 0  . traZODone (DESYREL) 150 MG tablet Take 1 tablet (150 mg total) by mouth at bedtime. 90 tablet 1  . verapamil (CALAN) 40 MG tablet TAKE ONE TABLET BY MOUTH DAILY 30 tablet 5   No current facility-administered medications on file prior to visit.    BP 110/66 mmHg  Pulse 76  Temp(Src) 98.7 F (37.1 C) (Oral)  Ht 5\' 2"  (1.575 m)  Wt 160 lb 12.8 oz (72.938 kg)   BMI 29.40 kg/m2  SpO2 98%  LMP 02/12/2014    Objective:   Physical Exam  Constitutional: She appears well-nourished.  Eyes: EOM are normal. Pupils are equal, round, and reactive to light.  Cardiovascular: Normal rate and regular rhythm.   Pulmonary/Chest: Effort normal and breath sounds normal.  Neurological: No cranial nerve deficit.  Skin:  Skin is warm and dry.          Assessment & Plan:

## 2016-03-14 NOTE — Assessment & Plan Note (Signed)
Chronic. Managed on preventative and abortive therapy. Migraine for past 3 days. Out of Maxalt. Will refill Maxalt today. IM phenergan and Toradol provided today in office, driver present. Will have her follow up with PCP to discuss recurrent headaches despite preventative treatment.

## 2016-03-14 NOTE — Progress Notes (Signed)
Pre visit review using our clinic review tool, if applicable. No additional management support is needed unless otherwise documented below in the visit note. 

## 2016-03-26 ENCOUNTER — Encounter: Payer: Self-pay | Admitting: Family Medicine

## 2016-03-26 ENCOUNTER — Ambulatory Visit (INDEPENDENT_AMBULATORY_CARE_PROVIDER_SITE_OTHER): Payer: BLUE CROSS/BLUE SHIELD | Admitting: Family Medicine

## 2016-03-26 VITALS — BP 124/66 | HR 77 | Temp 98.0°F | Wt 157.0 lb

## 2016-03-26 DIAGNOSIS — G44011 Episodic cluster headache, intractable: Secondary | ICD-10-CM

## 2016-03-26 DIAGNOSIS — R51 Headache: Secondary | ICD-10-CM

## 2016-03-26 DIAGNOSIS — G43119 Migraine with aura, intractable, without status migrainosus: Secondary | ICD-10-CM | POA: Diagnosis not present

## 2016-03-26 DIAGNOSIS — G43019 Migraine without aura, intractable, without status migrainosus: Secondary | ICD-10-CM

## 2016-03-26 DIAGNOSIS — R519 Headache, unspecified: Secondary | ICD-10-CM

## 2016-03-26 MED ORDER — CLONAZEPAM 1 MG PO TABS: 1.0000 mg | ORAL_TABLET | Freq: Three times a day (TID) | ORAL | Status: DC | PRN

## 2016-03-26 MED ORDER — AMITRIPTYLINE HCL 50 MG PO TABS: ORAL_TABLET | ORAL | Status: DC

## 2016-03-26 NOTE — Progress Notes (Signed)
Subjective:   Patient ID: Ashley [name], female    DOB: 12-Jul-1966, 50 y.o.   MRN: [id number]  Ashley [name] is a pleasant 50 y.o. year old female who presents to clinic today with Follow-up and Knee Pain  on 03/26/2016  HPI:  Migraines have deteriorated recently.  Saw my partner, Allie Bossier, on 03/14/16 for an intractable migraine. Note reviewed.  She presented with a 3 day migraine located behind her right eye and associated with photophobia, phonophobia, dizziness, and nausea.  She had ran out of her Maxalt prior to this migraine.  She is currently managed on Amitriptyline 30.5 mg, Phenergan, Maxalt, and Verapamil for frequent cluster headaches and migraine treatment.  Saw neurology, Dr. Delice Lesch in past.  Anda Kraft reviewed her maxalt and advised that she follow up with me.  Still getting migraines frequently but maxalt does relieve them when she gets one.  Current Outpatient Prescriptions on File Prior to Visit  Medication Sig Dispense Refill  . amitriptyline (ELAVIL) 25 MG tablet Take 1-1/2 tablet at dinner time 45 tablet 1  . chlorpheniramine (CHLOR-TRIMETON) 4 MG tablet Take 4 mg by mouth daily.    . clonazePAM (KLONOPIN) 1 MG tablet Take 1 tablet (1 mg total) by mouth 3 (three) times daily as needed for anxiety. 90 tablet 0  . dextromethorphan (DELSYM) 30 MG/5ML liquid 2 tsp every 12 hours as needed    . dicyclomine (BENTYL) 10 MG capsule TAKE ONE CAPSULE BY MOUTH TWICE DAILY AS NEEDED FOR  CRAMPING  AND  ABDOMINAL  PAIN 60 capsule 0  . estrogens, conjugated, (PREMARIN) 0.3 MG tablet Take 1 tablet (0.3 mg total) by mouth daily. Take daily for 21 days then do not take for 7 days. 30 tablet 3  . meloxicam (MOBIC) 15 MG tablet Take 1 tablet (15 mg total) by mouth daily. Take with food for low back pain 30 tablet 1  . metFORMIN (GLUCOPHAGE) 500 MG tablet Take 1 tablet (500 mg total) by mouth daily with breakfast. 90 tablet 1  . omeprazole (PRILOSEC) 20 MG capsule Take 20 mg by mouth 2 (two)  times daily at 8 am and 10 pm.    . polyethylene glycol powder (GLYCOLAX/MIRALAX) powder Take 17 g by mouth daily. Until daily soft stools  OTC    . promethazine (PHENERGAN) 25 MG tablet Take 1 tablet (25 mg total) by mouth every 6 (six) hours as needed for nausea or vomiting. 30 tablet 0  . Respiratory Therapy Supplies (FLUTTER) DEVI Use as directed 1 each 0  . rizatriptan (MAXALT-MLT) 10 MG disintegrating tablet Take 1 tablet (10 mg total) by mouth as needed for migraine. May repeat in 2 hours if needed. Do not take more than 3 tablets in a week. 10 tablet 3  . traMADol (ULTRAM) 50 MG tablet 1-2 every 4 hours as needed for cough or pain 40 tablet 0  . traZODone (DESYREL) 150 MG tablet Take 1 tablet (150 mg total) by mouth at bedtime. 90 tablet 1  . verapamil (CALAN) 40 MG tablet TAKE ONE TABLET BY MOUTH DAILY 30 tablet 5   No current facility-administered medications on file prior to visit.    Allergies  Allergen Reactions  . Penicillins Anaphylaxis    REACTION: Throat swells  . Topamax [Topiramate]     Hands and feet to go numb    Past Medical History  Diagnosis Date  . Anxiety   . Tobacco abuse   . Cataract     bilateral  .  SVD (spontaneous vaginal delivery)     x 3  . Bronchitis     uses inhaler if needed for bronchitis, lasted used -2014  . GERD (gastroesophageal reflux disease)   . History of blood transfusion Oct 21, 1966    at birth in [place], [place], unsure number of units  . Diabetes mellitus without complication ([place])     taken off Metformin since 04/2014, HgbA1C - normal, will follow up with PCP- Dr. Deborra Medina, 07/2014  . H/O exercise stress test 2011    done at [facility]- told that it was WNL, done due to pt. having panic attacks   . Chronic headaches     migraines - in past, uses Phenergan for nausea   . Poor dentition     very poor oral health     Past Surgical History  Procedure Laterality Date  . Appendectomy  1988  . Tubal ligation    .  Esophagogastroduodenoscopy  12/28/2013  . Knee arthroscopy  1995    left  . Colonoscopy  02/16/2014    normal   . Laparoscopic assisted vaginal hysterectomy N/A 04/15/2014    Procedure: LAPAROSCOPIC ASSISTED VAGINAL HYSTERECTOMY;  Surgeon: Marylynn Pearson, MD;  Location: [facility];  Service: Gynecology;  Laterality: N/A;  . Laparoscopic bilateral salpingo oopherectomy Bilateral 04/15/2014    Procedure: LAPAROSCOPIC BILATERAL SALPINGO OOPHORECTOMY;  Surgeon: Marylynn Pearson, MD;  Location: [facility];  Service: Gynecology;  Laterality: Bilateral;  . Abdominal hysterectomy    . Cholecystectomy N/A 06/07/2014    Procedure: LAPAROSCOPIC CHOLECYSTECTOMY;  Surgeon: Ralene Ok, MD;  Location: [facility];  Service: General;  Laterality: N/A;  . Cholecystectomy  June 07 2014    Family History  Problem Relation Age of Onset  . Diabetes Mother   . Hyperlipidemia Mother   . Hypertension Mother   . Colon cancer Neg Hx   . Rectal cancer Neg Hx   . Stomach cancer Neg Hx   . Prostate cancer Father   . Lung cancer Maternal Grandfather     smoked  . Emphysema Maternal Grandfather     smoked  . COPD Maternal Grandmother     never smoked   . Prostate cancer Father   . Thyroid cancer Paternal Aunt     Social History   Social History  . Marital Status: Married    Spouse Name: N/A  . Number of Children: 6  . Years of Education: N/A   Occupational History  .       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 Smoking status: Former Smoker -- 1.50 packs/day for 35 years    Types: Cigarettes, E-cigarettes    Quit date: 02/02/2014  . Smokeless tobacco: Never Used  . Alcohol Use: No  . Drug Use: No  . Sexual Activity: Yes    Birth Control/ Protection: Pill   Other Topics Concern  . Not on file   Social History Narrative   5 living children, one child is a crack cocaine addict who often breaks into their house to steal money. One child died of cerebral palsy.   The PMH, PSH,  Social History, Family History, Medications, and allergies have been reviewed in [facility], and have been updated if relevant.  Review of Systems  Constitutional: Negative.   Eyes: Positive for photophobia.  Gastrointestinal: Positive for nausea. Negative for vomiting.  Musculoskeletal: Negative.   Skin: Negative.   Neurological: Positive for headaches.  Hematological: Negative.   All other systems reviewed and  are negative.      Objective:    BP 124/66 mmHg  Pulse 77  Temp(Src) 98 F (36.7 C) (Other (Comment))  Wt 157 lb (71.215 kg)  SpO2 97%  LMP 02/12/2014   Physical Exam  Constitutional: She is oriented to person, place, and time. She appears well-developed and well-nourished. No distress.  HENT:  Head: Normocephalic and atraumatic.  Eyes: Conjunctivae are normal.  Cardiovascular: Normal rate.   Pulmonary/Chest: Effort normal.  Musculoskeletal: Normal range of motion.  Neurological: She is alert and oriented to person, place, and time. No cranial nerve deficit.  Skin: Skin is warm and dry. She is not diaphoretic.  Psychiatric: She has a normal mood and affect. Her behavior is normal. Judgment and thought content normal.  Nursing note and vitals reviewed.         Assessment & Plan:   Intractable migraine with aura without status migrainosus  Intractable migraine without aura and without status migrainosus No Follow-up on file.

## 2016-03-26 NOTE — Patient Instructions (Signed)
Good to see you. We are increasing your amitriptyline to 50 mg tablets- 1 1/2 tablets at bedtime daily.  Please call me with an update. If this does not help, we will refer you back to neurology.

## 2016-03-26 NOTE — Progress Notes (Signed)
Pre visit review using our clinic review tool, if applicable. No additional management support is needed unless otherwise documented below in the visit note. 

## 2016-03-26 NOTE — Assessment & Plan Note (Signed)
Deteriorated. Current preventative therapy no longer effective. Increase elavil dose. If not effective, refer back to neurology. The patient indicates understanding of these issues and agrees with the plan.

## 2016-04-02 ENCOUNTER — Ambulatory Visit (INDEPENDENT_AMBULATORY_CARE_PROVIDER_SITE_OTHER): Payer: BLUE CROSS/BLUE SHIELD | Admitting: Family Medicine

## 2016-04-02 ENCOUNTER — Ambulatory Visit (INDEPENDENT_AMBULATORY_CARE_PROVIDER_SITE_OTHER)
Admission: RE | Admit: 2016-04-02 | Discharge: 2016-04-02 | Disposition: A | Discharge status: Home / Self Care | Payer: BLUE CROSS/BLUE SHIELD | Source: Ambulatory Visit | Attending: Family Medicine | Admitting: Family Medicine

## 2016-04-02 VITALS — BP 102/64 | HR 89 | Temp 98.2°F | Ht 62.0 in | Wt 156.0 lb

## 2016-04-02 DIAGNOSIS — M25562 Pain in left knee: Secondary | ICD-10-CM

## 2016-04-02 MED ORDER — MELOXICAM 15 MG PO TABS: 15.0000 mg | ORAL_TABLET | Freq: Every day | ORAL | Status: DC

## 2016-04-02 NOTE — Patient Instructions (Signed)

## 2016-04-02 NOTE — Progress Notes (Signed)
Dr. Frederico Hamman T. Modene Andy, MD, [facility] [place], [address] Phone: [phone] Fax: [phone]  04/02/2016  Patient: Ashley [name], MRN: [id number], DOB: [birth year], 50 y.o.  Primary Physician:  Arnette Norris, MD   Chief Complaint  Patient presents with  . Knee Pain    Left x 2 weeks but fell yesterday   Subjective:   Ashley [name] is a 50 y.o. very pleasant female patient who presents with the following:  2-3 weeks ago, can feel something stretching on the back, and yesterday landed on the top of her knee yesterday. Can feel when bending. This is all been on the left knee.  2 or 3 weeks ago she is not sure exactly what she did to her knee, but it is been tender, primarily on the medial joint line since then, and she also is having worsening pain after a fall yesterday.  She has a small effusion.  She does feel posterior pole is.  She is not having any mechanical locking up of her joint her knee.  She is having some giving way.  Arthroscopy 20 years ago by Dr. Noemi Chapel. This was on the affected joint.  Past Medical History, Surgical History, Social History, Family History, Problem List, Medications, and Allergies have been reviewed and updated if relevant.  Patient Active Problem List   Diagnosis Date Noted  . Chronic bronchitis ([place]) 10/20/2015  . HLD (hyperlipidemia) 04/12/2015  . Migraine with aura, intractable 08/12/2014  . S/P hysterectomy with oophorectomy 04/15/2014  . Hyperglycemia 01/26/2013  . Migraine 05/30/2010  . Anxiety state 09/30/2009  . INSOMNIA, CHRONIC 09/30/2009  . ADJUSTMENT DISORDER WITH MIXED FEATURES 09/30/2009  . ELEVATED BLOOD PRESSURE WITHOUT DIAGNOSIS OF HYPERTENSION 09/30/2009    Past Medical History  Diagnosis Date  . Anxiety   . Tobacco abuse   . Cataract     bilateral  . SVD (spontaneous vaginal delivery)     x 3  . Bronchitis     uses inhaler if needed for bronchitis, lasted used  -2014  . GERD (gastroesophageal reflux disease)   . History of blood transfusion 09-23-1966    at birth in [place], [place], unsure number of units  . Diabetes mellitus without complication ([place])     taken off Metformin since 04/2014, HgbA1C - normal, will follow up with PCP- Dr. Deborra Medina, 07/2014  . H/O exercise stress test 2011    done at [facility]- told that it was WNL, done due to pt. having panic attacks   . Chronic headaches     migraines - in past, uses Phenergan for nausea   . Poor dentition     very poor oral health     Past Surgical History  Procedure Laterality Date  . Appendectomy  1988  . Tubal ligation    . Esophagogastroduodenoscopy  12/28/2013  . Knee arthroscopy  1995    left  . Colonoscopy  02/16/2014    normal   . Laparoscopic assisted vaginal hysterectomy N/A 04/15/2014    Procedure: LAPAROSCOPIC ASSISTED VAGINAL HYSTERECTOMY;  Surgeon: Marylynn Pearson, MD;  Location: [facility];  Service: Gynecology;  Laterality: N/A;  . Laparoscopic bilateral salpingo oopherectomy Bilateral 04/15/2014    Procedure: LAPAROSCOPIC BILATERAL SALPINGO OOPHORECTOMY;  Surgeon: Marylynn Pearson, MD;  Location: [facility];  Service: Gynecology;  Laterality: Bilateral;  . Abdominal hysterectomy    . Cholecystectomy N/A 06/07/2014    Procedure: LAPAROSCOPIC CHOLECYSTECTOMY;  Surgeon: Ralene Ok, MD;  Location: [facility];  Service: General;  Laterality: N/A;  . Cholecystectomy  June 07 2014    Social History   Social History  . Marital Status: Married    Spouse Name: N/A  . Number of Children: 6  . Years of Education: N/A   Occupational History  .       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 Smoking status: Former Smoker -- 1.50 packs/day for 35 years    Types: Cigarettes, E-cigarettes    Quit date: 02/02/2014  . Smokeless tobacco: Never Used  . Alcohol Use: No  . Drug Use: No  . Sexual Activity: Yes    Birth Control/ Protection: Pill   Other Topics Concern  . Not on  file   Social History Narrative   5 living children, one child is a crack cocaine addict who often breaks into their house to steal money. One child died of cerebral palsy.    Family History  Problem Relation Age of Onset  . Diabetes Mother   . Hyperlipidemia Mother   . Hypertension Mother   . Colon cancer Neg Hx   . Rectal cancer Neg Hx   . Stomach cancer Neg Hx   . Prostate cancer Father   . Lung cancer Maternal Grandfather     smoked  . Emphysema Maternal Grandfather     smoked  . COPD Maternal Grandmother     never smoked   . Prostate cancer Father   . Thyroid cancer Paternal Aunt     Allergies  Allergen Reactions  . Penicillins Anaphylaxis    REACTION: Throat swells  . Topamax [Topiramate]     Hands and feet to go numb    Medication list reviewed and updated in full in [facility].  GEN: No fevers, chills. Nontoxic. Primarily MSK c/o today. MSK: Detailed in the HPI GI: tolerating PO intake without difficulty Neuro: No numbness, parasthesias, or tingling associated. Otherwise the pertinent positives of the ROS are noted above.   Objective:   BP 102/64 mmHg  Pulse 89  Temp(Src) 98.2 F (36.8 C) (Oral)  Ht 5\' 2"  (1.575 m)  Wt 156 lb (70.761 kg)  BMI 28.53 kg/m2  LMP 02/12/2014   GEN: WDWN, NAD, Non-toxic, Alert & Oriented x 3 HEENT: Atraumatic, Normocephalic.  Ears and Nose: No external deformity. EXTR: No clubbing/cyanosis/edema NEURO: Normal gait.  PSYCH: Normally interactive. Conversant. Not depressed or anxious appearing.  Calm demeanor.   Knee:  LEFT Gait: Normal heel toe pattern ROM: 0 - 110 Effusion: neg Echymosis or edema: none Patellar tendon NT Painful PLICA: neg Patellar grind: negative Medial and lateral patellar facet loading: negative medial and lateral joint lines: NOTABLE MEDIAL JOINT LINE TENDERNESS Mcmurray's POS Flexion-pinch POS BOUNCE HOME POS Varus and valgus stress: stable Lachman: neg Ant and [place] drawer:  neg Hip abduction, IR, ER: WNL Hip flexion str: 5/5 Hip abd: 5/5 Quad: 5/5 VMO atrophy:No Hamstring concentric and eccentric: 5/5   Radiology: Dg Knee Ap/lat W/sunrise Left  04/02/2016  CLINICAL DATA:  Fall yesterday with left knee pain. Pain for 2 weeks prior. Initial encounter. EXAM: LEFT KNEE 3 VIEWS COMPARISON:  None. FINDINGS: There is no evidence of fracture, dislocation, or joint effusion. Minor marginal spurring without definitive joint narrowing. IMPRESSION: 1. Negative for fracture. 2. No arthritic narrowing. Electronically Signed   By: Monte Fantasia M.D.   On: 04/02/2016 10:20     Assessment and Plan:   Left knee pain -  Plan: DG Knee AP/LAT W/Sunrise Left, MR Knee Left  Wo Contrast  Concern for internal derangement left knee.  No arthritis on plain films.  Medial joint line tenderness with positive McMurray's test, positive bounce home test, positive flexion pinch test, concerning for meniscal tear.  Obtain an MRI of the left knee to evaluate internal derangement as noted above meniscal tear, cruciate ligament integrity, or bone injury not seen on plain film.   Follow-up: depending on MRI  Modified Medications   Modified Medication Previous Medication   MELOXICAM (MOBIC) 15 MG TABLET meloxicam (MOBIC) 15 MG tablet      Take 1 tablet (15 mg total) by mouth daily. Take with food for low back pain    Take 1 tablet (15 mg total) by mouth daily. Take with food for low back pain   Orders Placed This Encounter  Procedures  . DG Knee AP/LAT W/Sunrise Left  . MR Knee Left  Wo Contrast    Signed,  Avry Roedl T. Taj Arteaga, MD   Patient's Medications  New Prescriptions   No medications on file  Previous Medications   AMITRIPTYLINE (ELAVIL) 50 MG TABLET    Take 1-1/2 tablet at dinner time   CHLORPHENIRAMINE (CHLOR-TRIMETON) 4 MG TABLET    Take 4 mg by mouth daily.   CLONAZEPAM (KLONOPIN) 1 MG TABLET    Take 1 tablet (1 mg total) by mouth 3 (three) times daily as needed for  anxiety.   DEXTROMETHORPHAN (DELSYM) 30 MG/5ML LIQUID    2 tsp every 12 hours as needed   DICYCLOMINE (BENTYL) 10 MG CAPSULE    TAKE ONE CAPSULE BY MOUTH TWICE DAILY AS NEEDED FOR  CRAMPING  AND  ABDOMINAL  PAIN   ESTROGENS, CONJUGATED, (PREMARIN) 0.3 MG TABLET    Take 1 tablet (0.3 mg total) by mouth daily. Take daily for 21 days then do not take for 7 days.   METFORMIN (GLUCOPHAGE) 500 MG TABLET    Take 1 tablet (500 mg total) by mouth daily with breakfast.   OMEPRAZOLE (PRILOSEC) 20 MG CAPSULE    Take 20 mg by mouth 2 (two) times daily at 8 am and 10 pm.   POLYETHYLENE GLYCOL POWDER (GLYCOLAX/MIRALAX) POWDER    Take 17 g by mouth daily. Until daily soft stools  OTC   PROMETHAZINE (PHENERGAN) 25 MG TABLET    Take 1 tablet (25 mg total) by mouth every 6 (six) hours as needed for nausea or vomiting.   RESPIRATORY THERAPY SUPPLIES (FLUTTER) DEVI    Use as directed   RIZATRIPTAN (MAXALT-MLT) 10 MG DISINTEGRATING TABLET    Take 1 tablet (10 mg total) by mouth as needed for migraine. May repeat in 2 hours if needed. Do not take more than 3 tablets in a week.   TRAMADOL (ULTRAM) 50 MG TABLET    1-2 every 4 hours as needed for cough or pain   TRAZODONE (DESYREL) 150 MG TABLET    Take 1 tablet (150 mg total) by mouth at bedtime.   VERAPAMIL (CALAN) 40 MG TABLET    TAKE ONE TABLET BY MOUTH DAILY  Modified Medications   Modified Medication Previous Medication   MELOXICAM (MOBIC) 15 MG TABLET meloxicam (MOBIC) 15 MG tablet      Take 1 tablet (15 mg total) by mouth daily. Take with food for low back pain    Take 1 tablet (15 mg total) by mouth daily. Take with food for low back pain  Discontinued Medications   No medications on file

## 2016-04-02 NOTE — Progress Notes (Signed)
Pre visit review using our clinic review tool, if applicable. No additional management support is needed unless otherwise documented below in the visit note. 

## 2016-04-03 ENCOUNTER — Encounter: Payer: Self-pay | Admitting: Family Medicine

## 2016-04-12 ENCOUNTER — Ambulatory Visit
Admission: RE | Admit: 2016-04-12 | Discharge: 2016-04-12 | Disposition: A | Discharge status: Home / Self Care | Payer: BLUE CROSS/BLUE SHIELD | Source: Ambulatory Visit | Attending: Family Medicine | Admitting: Family Medicine

## 2016-04-12 DIAGNOSIS — M25562 Pain in left knee: Secondary | ICD-10-CM

## 2016-04-18 ENCOUNTER — Telehealth: Payer: Self-pay | Admitting: Family Medicine

## 2016-04-18 ENCOUNTER — Telehealth: Payer: Self-pay | Admitting: Neurology

## 2016-04-18 DIAGNOSIS — G43009 Migraine without aura, not intractable, without status migrainosus: Secondary | ICD-10-CM

## 2016-04-18 NOTE — Telephone Encounter (Signed)
Pt called  stating she need a referral to dr Ashley [name] for [facility]

## 2016-04-18 NOTE — Telephone Encounter (Signed)
Returned patients call. Explained to her that we didn't have anything sooner as Dr. Delice Lesch is out on leave. Advised her to talk with her pcp to see if there was anything else they would recommend. I did ask her if their office did headache cocktail she said they did but she wasn't trying to get a shot. I did tell her since she hasn't been able to get rid of the headache with her migraine medications that may be her only option at this point. Patient verbalized understanding.

## 2016-04-18 NOTE — Telephone Encounter (Signed)
Referral placed.

## 2016-04-18 NOTE — Telephone Encounter (Signed)
Pt states that her pcp was refilling her medication to help save on the copay, but the medication is not working now and would like to come in and see Dr Delice Lesch i told her she was out of the office until July ans was not sure how we were doing this please call [phone]

## 2016-04-18 NOTE — Telephone Encounter (Signed)
If you could schedule her with Dr. Delice Lesch for when she comes back, her pcp can manage her until then since he's been prescribing her medication anyway.   Thanks.

## 2016-04-18 NOTE — Telephone Encounter (Signed)
Ashley [name] 2066-01-16. She called today regarding a referral from her PCP Dr. Deborra Medina. She said she has had a headache for 2 weeks. She wondered if she could be seen any sooner. I told her that July would be the earliest that we could see her. She said that Dr.Aron does not know what else to do for her. Please let me know and I can call her back. She did make an appointment for 07/09/16. Thank you

## 2016-04-27 ENCOUNTER — Encounter (HOSPITAL_COMMUNITY): Payer: Self-pay

## 2016-04-27 ENCOUNTER — Telehealth: Payer: Self-pay | Admitting: Family Medicine

## 2016-04-27 ENCOUNTER — Emergency Department (HOSPITAL_COMMUNITY): Payer: BLUE CROSS/BLUE SHIELD

## 2016-04-27 ENCOUNTER — Emergency Department (HOSPITAL_COMMUNITY)
Admission: EM | Admit: 2016-04-27 | Discharge: 2016-04-27 | Disposition: A | Discharge status: Home / Self Care | Payer: BLUE CROSS/BLUE SHIELD | Attending: Emergency Medicine | Admitting: Emergency Medicine

## 2016-04-27 DIAGNOSIS — R110 Nausea: Secondary | ICD-10-CM | POA: Diagnosis not present

## 2016-04-27 DIAGNOSIS — E119 Type 2 diabetes mellitus without complications: Secondary | ICD-10-CM | POA: Diagnosis not present

## 2016-04-27 DIAGNOSIS — R200 Anesthesia of skin: Secondary | ICD-10-CM | POA: Insufficient documentation

## 2016-04-27 DIAGNOSIS — F17210 Nicotine dependence, cigarettes, uncomplicated: Secondary | ICD-10-CM | POA: Diagnosis not present

## 2016-04-27 DIAGNOSIS — R531 Weakness: Secondary | ICD-10-CM | POA: Insufficient documentation

## 2016-04-27 DIAGNOSIS — Z79899 Other long term (current) drug therapy: Secondary | ICD-10-CM | POA: Diagnosis not present

## 2016-04-27 DIAGNOSIS — G43809 Other migraine, not intractable, without status migrainosus: Secondary | ICD-10-CM | POA: Diagnosis not present

## 2016-04-27 DIAGNOSIS — K219 Gastro-esophageal reflux disease without esophagitis: Secondary | ICD-10-CM | POA: Diagnosis not present

## 2016-04-27 DIAGNOSIS — Z7984 Long term (current) use of oral hypoglycemic drugs: Secondary | ICD-10-CM | POA: Diagnosis not present

## 2016-04-27 DIAGNOSIS — R519 Headache, unspecified: Secondary | ICD-10-CM

## 2016-04-27 DIAGNOSIS — R51 Headache: Secondary | ICD-10-CM | POA: Diagnosis present

## 2016-04-27 DIAGNOSIS — G8929 Other chronic pain: Secondary | ICD-10-CM | POA: Insufficient documentation

## 2016-04-27 DIAGNOSIS — Z8709 Personal history of other diseases of the respiratory system: Secondary | ICD-10-CM | POA: Insufficient documentation

## 2016-04-27 DIAGNOSIS — Z791 Long term (current) use of non-steroidal anti-inflammatories (NSAID): Secondary | ICD-10-CM | POA: Insufficient documentation

## 2016-04-27 DIAGNOSIS — Z880 Allergy status to penicillin: Secondary | ICD-10-CM | POA: Insufficient documentation

## 2016-04-27 DIAGNOSIS — F419 Anxiety disorder, unspecified: Secondary | ICD-10-CM | POA: Diagnosis not present

## 2016-04-27 LAB — I-STAT TROPONIN, ED: TROPONIN I, POC: 0 ng/mL (ref 0.00–0.08)

## 2016-04-27 LAB — DIFFERENTIAL
BASOS PCT: 0 %
Basophils Absolute: 0 10*3/uL (ref 0.0–0.1)
EOS ABS: 0.3 10*3/uL (ref 0.0–0.7)
EOS PCT: 2 %
Lymphocytes Relative: 29 %
Lymphs Abs: 3.2 10*3/uL (ref 0.7–4.0)
MONO ABS: 0.8 10*3/uL (ref 0.1–1.0)
MONOS PCT: 7 %
Neutro Abs: 6.8 10*3/uL (ref 1.7–7.7)
Neutrophils Relative %: 62 %

## 2016-04-27 LAB — CBC
HCT: 41.9 % (ref 36.0–46.0)
Hemoglobin: 13.9 g/dL (ref 12.0–15.0)
MCH: 30.8 pg (ref 26.0–34.0)
MCHC: 33.2 g/dL (ref 30.0–36.0)
MCV: 92.9 fL (ref 78.0–100.0)
PLATELETS: 379 10*3/uL (ref 150–400)
RBC: 4.51 MIL/uL (ref 3.87–5.11)
RDW: 14 % (ref 11.5–15.5)
WBC: 11.1 10*3/uL — AB (ref 4.0–10.5)

## 2016-04-27 LAB — I-STAT CHEM 8, ED
BUN: 13 mg/dL (ref 6–20)
CALCIUM ION: 1.11 mmol/L — AB (ref 1.12–1.23)
CHLORIDE: 103 mmol/L (ref 101–111)
Creatinine, Ser: 0.8 mg/dL (ref 0.44–1.00)
Glucose, Bld: 148 mg/dL — ABNORMAL HIGH (ref 65–99)
HCT: 45 % (ref 36.0–46.0)
HEMOGLOBIN: 15.3 g/dL — AB (ref 12.0–15.0)
Potassium: 3.4 mmol/L — ABNORMAL LOW (ref 3.5–5.1)
SODIUM: 142 mmol/L (ref 135–145)
TCO2: 23 mmol/L (ref 0–100)

## 2016-04-27 LAB — URINALYSIS, ROUTINE W REFLEX MICROSCOPIC
Bilirubin Urine: NEGATIVE
Glucose, UA: NEGATIVE mg/dL
Hgb urine dipstick: NEGATIVE
Ketones, ur: NEGATIVE mg/dL
LEUKOCYTES UA: NEGATIVE
NITRITE: NEGATIVE
PH: 6 (ref 5.0–8.0)
Protein, ur: NEGATIVE mg/dL
SPECIFIC GRAVITY, URINE: 1.013 (ref 1.005–1.030)

## 2016-04-27 LAB — RAPID URINE DRUG SCREEN, HOSP PERFORMED
AMPHETAMINES: NOT DETECTED
Barbiturates: NOT DETECTED
Benzodiazepines: NOT DETECTED
Cocaine: NOT DETECTED
OPIATES: NOT DETECTED
Tetrahydrocannabinol: NOT DETECTED

## 2016-04-27 LAB — COMPREHENSIVE METABOLIC PANEL
ALK PHOS: 95 U/L (ref 38–126)
ALT: 18 U/L (ref 14–54)
ANION GAP: 10 (ref 5–15)
AST: 20 U/L (ref 15–41)
Albumin: 3.6 g/dL (ref 3.5–5.0)
BUN: 12 mg/dL (ref 6–20)
CALCIUM: 9 mg/dL (ref 8.9–10.3)
CO2: 23 mmol/L (ref 22–32)
CREATININE: 0.86 mg/dL (ref 0.44–1.00)
Chloride: 107 mmol/L (ref 101–111)
Glucose, Bld: 155 mg/dL — ABNORMAL HIGH (ref 65–99)
Potassium: 3.5 mmol/L (ref 3.5–5.1)
SODIUM: 140 mmol/L (ref 135–145)
TOTAL PROTEIN: 6.6 g/dL (ref 6.5–8.1)
Total Bilirubin: 0.5 mg/dL (ref 0.3–1.2)

## 2016-04-27 LAB — ETHANOL

## 2016-04-27 LAB — APTT: aPTT: 29 seconds (ref 24–37)

## 2016-04-27 LAB — PROTIME-INR
INR: 1.01 (ref 0.00–1.49)
PROTHROMBIN TIME: 13.5 s (ref 11.6–15.2)

## 2016-04-27 MED ORDER — DEXAMETHASONE SODIUM PHOSPHATE 10 MG/ML IJ SOLN
10.0000 mg | Freq: Once | INTRAMUSCULAR | Status: AC
  Administered 2016-04-27: 10 mg via INTRAVENOUS
  Filled 2016-04-27: qty 1

## 2016-04-27 MED ORDER — METOCLOPRAMIDE HCL 5 MG/ML IJ SOLN
10.0000 mg | Freq: Once | INTRAMUSCULAR | Status: AC
  Administered 2016-04-27: 10 mg via INTRAVENOUS
  Filled 2016-04-27: qty 2

## 2016-04-27 MED ORDER — KETOROLAC TROMETHAMINE 30 MG/ML IJ SOLN
30.0000 mg | Freq: Once | INTRAMUSCULAR | Status: AC
  Administered 2016-04-27: 30 mg via INTRAVENOUS
  Filled 2016-04-27: qty 1

## 2016-04-27 NOTE — ED Provider Notes (Signed)
Patient seen and evaluated. Discussed with Dr. Laneta Simmers of [facility]. Normal MRI. Headache nearly resolved. Tingling persists. However no weakness. She is ambulatory. Given Decadron for her remnant symptoms. Very likely a typical migraine. Has neurology follow-up. Appropriate for discharge.  Tanna Furry, MD 04/27/16 2004

## 2016-04-27 NOTE — Discharge Instructions (Signed)

## 2016-04-27 NOTE — ED Notes (Signed)
Pt oob to br via wc for specimen. Steady gait and movement. Bilateral equal strength.

## 2016-04-27 NOTE — Code Documentation (Signed)
50yo female arriving to the [facility] via [place] at 22.  Patient with h/o pre-DM and chronic migraines.  EMS reports that patient has had a headache x 2 days with worsening of headache today.  Patient reports onset of right arm numbness and weakness at 1100 today.  Stroke team at the bedside on patient arrival.  Labs drawn and patient cleared for CT.  Patient to CT with team.  NIHSS 3, see documentation for details and code stroke times.  Patient with right arm and leg drift and decreased sensation on the right side.  Of note, patient with inconsistent exam including splitting the midline and reports difference in sensation with tuning fork.  Dr. Leonie Man at the bedside.  Medications ordered to treat headache.  Code stroke canceled.  Bedside handoff with ED RN Millie.

## 2016-04-27 NOTE — Telephone Encounter (Signed)
Per chart review pt is at [facility] now. 

## 2016-04-27 NOTE — ED Notes (Signed)
Pt [phone] EMS with c/o right sided weakness  And headach since 1130.Met at bridge by stroke team and taken to CT.

## 2016-04-27 NOTE — Telephone Encounter (Signed)
Patient Name: Ashley [name] DOB: 04-09-1966 Initial Comment Caller states she is not feeling good- right side is tingling and numb and she is light headed. Nurse Assessment Nurse: Vallery Sa, RN, Cathy Date/Time (Eastern Time): 04/27/2016 12:46:33 PM Confirm and document reason for call. If symptomatic, describe symptoms. You must click the next button to save text entered. ---Caller states she is not having severe breathing difficulty. She developed right sided numbness on her right side about an hour ago. She developed a headache 2 days ago that she rates as an 8 on the 1 to 10 scale. Alert and responsive. Has the patient traveled out of the country within the last 30 days? ---No Does the patient have any new or worsening symptoms? ---Yes Will a triage be completed? ---Yes Related visit to physician within the last 2 weeks? ---No Does the PT have any chronic conditions? (i.e. diabetes, asthma, etc.) ---Yes List chronic conditions. ---Pre-Diabetes, Chronic Migraines Is the patient pregnant or possibly pregnant? (Ask all females between the ages of 80-55) ---No Is this a behavioral health or substance abuse call? ---No Guidelines Guideline Title Affirmed Question Affirmed Notes Neurologic Deficit [1] Numbness (i.e., loss of sensation) of the face, arm / hand, or leg / foot on one side of the body AND [2] sudden onset AND [3] present now Final Disposition User Call EMS 911 Now Vallery Sa, RN, [place] Disagree/Comply: Gerrianne Scale with EMS operator [phone]

## 2016-04-27 NOTE — ED Provider Notes (Signed)
CSN: [id number]     Arrival date & time 04/27/16  1343 History   First MD Initiated Contact with Patient 04/27/16 1413     Chief Complaint  Patient presents with  . Headache  . Stroke Symptoms    An emergency department physician performed an initial assessment on this suspected stroke patient at 1346. (Consider location/radiation/quality/duration/timing/severity/associated sxs/prior Treatment) HPI Comments: Pt comes in with cc of R sided weakness. Pt has hx of migraine and HTN. She has no hx of strokes. Reports that around 11 am she started having R sided weakness and numbness. She feels that her RUE is heavy. She has paresthesias and numbness to the R side. Pt has a headache - typical of her migraine headache, which started 2 days ago. The headache is severe, R sided sharp pain, worse with light and with nausea.   ROS 10 Systems reviewed and are negative for acute change except as noted in the HPI.     Patient is a 50 y.o. female presenting with headaches. The history is provided by the patient.  Headache   Past Medical History  Diagnosis Date  . Anxiety   . Tobacco abuse   . Cataract     bilateral  . SVD (spontaneous vaginal delivery)     x 3  . Bronchitis     uses inhaler if needed for bronchitis, lasted used -2014  . GERD (gastroesophageal reflux disease)   . History of blood transfusion Apr 23, 1966    at birth in [place], [place], unsure number of units  . Diabetes mellitus without complication ([place])     taken off Metformin since 04/2014, HgbA1C - normal, will follow up with PCP- Dr. Deborra Medina, 07/2014  . H/O exercise stress test 2011    done at [facility]- told that it was WNL, done due to pt. having panic attacks   . Chronic headaches     migraines - in past, uses Phenergan for nausea   . Poor dentition     very poor oral health    Past Surgical History  Procedure Laterality Date  . Appendectomy  1988  . Tubal ligation    . Esophagogastroduodenoscopy  12/28/2013  . Knee  arthroscopy  1995    left  . Colonoscopy  02/16/2014    normal   . Laparoscopic assisted vaginal hysterectomy N/A 04/15/2014    Procedure: LAPAROSCOPIC ASSISTED VAGINAL HYSTERECTOMY;  Surgeon: Marylynn Pearson, MD;  Location: [facility];  Service: Gynecology;  Laterality: N/A;  . Laparoscopic bilateral salpingo oopherectomy Bilateral 04/15/2014    Procedure: LAPAROSCOPIC BILATERAL SALPINGO OOPHORECTOMY;  Surgeon: Marylynn Pearson, MD;  Location: [facility];  Service: Gynecology;  Laterality: Bilateral;  . Abdominal hysterectomy    . Cholecystectomy N/A 06/07/2014    Procedure: LAPAROSCOPIC CHOLECYSTECTOMY;  Surgeon: Ralene Ok, MD;  Location: [facility];  Service: General;  Laterality: N/A;  . Cholecystectomy  June 07 2014   Family History  Problem Relation Age of Onset  . Diabetes Mother   . Hyperlipidemia Mother   . Hypertension Mother   . Colon cancer Neg Hx   . Rectal cancer Neg Hx   . Stomach cancer Neg Hx   . Prostate cancer Father   . Lung cancer Maternal Grandfather     smoked  . Emphysema Maternal Grandfather     smoked  . COPD Maternal Grandmother     never smoked   . Prostate cancer Father   . Thyroid cancer Paternal Aunt    Social History  Substance Use  Topics  . Smoking status: Heavy Tobacco Smoker -- 1.50 packs/day for 35 years    Types: Cigarettes, E-cigarettes    Last Attempt to Quit: 02/02/2014  . Smokeless tobacco: Never Used  . Alcohol Use: No   OB History    No data available     Review of Systems  Neurological: Positive for headaches.      Allergies  Penicillins and Topamax  Home Medications   Prior to Admission medications   Medication Sig Start Date End Date Taking? Authorizing Provider  amitriptyline (ELAVIL) 50 MG tablet Take 1-1/2 tablet at dinner time Patient taking differently: Take 75 mg by mouth at bedtime. Take 1-1/2 tablet at dinner time 03/26/16  Yes Lucille Passy, MD  chlorpheniramine (CHLOR-TRIMETON) 4 MG tablet Take 4 mg by mouth every 6  (six) hours as needed for allergies.    Yes Historical Provider, MD  clonazePAM (KLONOPIN) 1 MG tablet Take 1 tablet (1 mg total) by mouth 3 (three) times daily as needed for anxiety. 03/26/16  Yes Lucille Passy, MD  dicyclomine (BENTYL) 10 MG capsule TAKE ONE CAPSULE BY MOUTH TWICE DAILY AS NEEDED FOR  CRAMPING  AND  ABDOMINAL  PAIN 01/16/16  Yes Lucille Passy, MD  ibuprofen (ADVIL,MOTRIN) 200 MG tablet Take 800 mg by mouth every 6 (six) hours as needed for headache. Reported on 04/27/2016   Yes Historical Provider, MD  meloxicam (MOBIC) 15 MG tablet Take 1 tablet (15 mg total) by mouth daily. Take with food for low back pain 04/02/16  Yes Owens Loffler, MD  metFORMIN (GLUCOPHAGE) 500 MG tablet Take 1 tablet (500 mg total) by mouth daily with breakfast. 01/03/16  Yes Lucille Passy, MD  omeprazole (PRILOSEC) 20 MG capsule Take 20 mg by mouth 2 (two) times daily at 8 am and 10 pm.   Yes Historical Provider, MD  polyethylene glycol powder (GLYCOLAX/MIRALAX) powder Take 17 g by mouth daily as needed for moderate constipation. Until daily soft stools  OTC 12/10/13  Yes Jennifer Piepenbrink, PA-C  promethazine (PHENERGAN) 25 MG tablet Take 1 tablet (25 mg total) by mouth every 6 (six) hours as needed for nausea or vomiting. 01/03/16  Yes Lucille Passy, MD  rizatriptan (MAXALT-MLT) 10 MG disintegrating tablet Take 1 tablet (10 mg total) by mouth as needed for migraine. May repeat in 2 hours if needed. Do not take more than 3 tablets in a week. 03/14/16  Yes Pleas Koch, NP  traZODone (DESYREL) 150 MG tablet Take 1 tablet (150 mg total) by mouth at bedtime. 01/31/16  Yes Lucille Passy, MD  verapamil (CALAN) 40 MG tablet TAKE ONE TABLET BY MOUTH DAILY 11/07/15  Yes Lucille Passy, MD  estrogens, conjugated, (PREMARIN) 0.3 MG tablet Take 1 tablet (0.3 mg total) by mouth daily. Take daily for 21 days then do not take for 7 days. 04/12/15   Lucille Passy, MD  Respiratory Therapy Supplies (FLUTTER) DEVI Use as directed  12/21/15   Tanda Rockers, MD  traMADol Veatrice Bourbon) 50 MG tablet 1-2 every 4 hours as needed for cough or pain 12/21/15   Tanda Rockers, MD   BP 113/70 mmHg  Pulse 72  Temp(Src) 98.1 F (36.7 C) (Oral)  Resp 15  Ht 5\' 2"  (1.575 m)  Wt 160 lb (72.576 kg)  BMI 29.26 kg/m2  SpO2 99%  LMP 02/12/2014 Physical Exam  Constitutional: She is oriented to person, place, and time. She appears well-developed.  HENT:  Head: Normocephalic  and atraumatic.  Eyes: EOM are normal.  Neck: Normal range of motion. Neck supple.  Cardiovascular: Normal rate and intact distal pulses.   Pulmonary/Chest: Effort normal.  Abdominal: Bowel sounds are normal.  Neurological: She is alert and oriented to person, place, and time.  Pt has right sided facial numbness RUE and RLE facial numbness RUE strength 3/5 RLE strength 4/5  NIHSS - 4  Skin: Skin is warm and dry.  Nursing note and vitals reviewed.   ED Course  Procedures (including critical care time) Labs Review Labs Reviewed  CBC - Abnormal; Notable for the following:    WBC 11.1 (*)    All other components within normal limits  COMPREHENSIVE METABOLIC PANEL - Abnormal; Notable for the following:    Glucose, Bld 155 (*)    All other components within normal limits  I-STAT CHEM 8, ED - Abnormal; Notable for the following:    Potassium 3.4 (*)    Glucose, Bld 148 (*)    Calcium, Ion 1.11 (*)    Hemoglobin 15.3 (*)    All other components within normal limits  ETHANOL  PROTIME-INR  APTT  DIFFERENTIAL  URINE RAPID DRUG SCREEN, HOSP PERFORMED  URINALYSIS, ROUTINE W REFLEX MICROSCOPIC (NOT AT [facility])  I-STAT TROPOININ, ED    Imaging Review Ct Head Wo Contrast  04/27/2016  CLINICAL DATA:  Headache, right-sided numbness and leg heaviness. EXAM: CT HEAD WITHOUT CONTRAST TECHNIQUE: Contiguous axial images were obtained from the base of the skull through the vertex without intravenous contrast. COMPARISON:  CT scan of July 02, 2010. FINDINGS: Bony  calvarium appears intact. No mass effect or midline shift is noted. Ventricular size is within normal limits. There is no evidence of mass lesion, hemorrhage or acute infarction. IMPRESSION: Normal head CT. These results were called by telephone at the time of interpretation on 04/27/2016 at 2:07 pm to Dr. Cordie Grice, who verbally acknowledged these results. Electronically Signed   By: Marijo Conception, M.D.   On: 04/27/2016 14:08   I have personally reviewed and evaluated these images and lab results as part of my medical decision-making.   EKG Interpretation   Date/Time:  Friday Apr 27 2016 14:19:50 EDT Ventricular Rate:  75 PR Interval:  160 QRS Duration: 87 QT Interval:  406 QTC Calculation: 453 R Axis:   9 Text Interpretation:  Sinus rhythm Probable left atrial enlargement  Borderline T abnormalities, anterior leads No significant change since  last tracing Confirmed by CRENSHAW  MD, BRIAN ([id number]) on 04/27/2016 3:20:14  PM      MDM   Final diagnoses:  None    Pt comes in with cc of numbness and weakness to the R side, acute. Last normal at 11 am. Code stroke was activated, and Neuro team saw the patient at bedside.  Pt has hx of migraines and she is having active headaches.  DDX: Acute stroke Complex migraine.  Per Neuro recs: MRI ordered - if neg, pt can be discharged as long as she can ambulated. If MRI +, pt will need medicine admit.  Pt is not a TPA candidate per Neurology, as her symptoms are unclear and no debilitating.  Pt made aware of the concerns and the plan. She has no further questions at this time.  Varney Biles, MD 04/27/16 1526

## 2016-04-27 NOTE — Consult Note (Signed)
Reason for Consult:Code Stroke Referring Physician: Sahana [name] is an 50 y.o. Ashley [name].  HPI:  50 year old Caucasian lady who developed sudden onset of right arm numbness and right leg heaviness at 11 AM today. She reported prior history of worsening headaches for the last 2 days.  She denied any double vision, loss of vision, dysarthria, extremity weakness, gait or balance problems. She has no prior history of strokes or TIA. She has history of chronic migraine headaches and has seen Dr. Pincus Large neurologist in the past. She does take verapamil and amitriptyline for her headaches.Upon arrival in the ER she was seen immediately upon arrival and-stroke scale was 3 with subjective but variable drift in the right arm and leg and subjective sensory loss on the right hemibody with splitting of the midline. Code stroke was canceled due to mild symptoms and nonorganic features. CT scan of the head was obtained emergently which I personally reviewed and was normal without any acute abnormalities. Last seen normal at 11 AM 04/27/16. IV tPA not given due to mild symptoms She denied any recent stress or major changes in her life.  14 system review of systems  was obtained and was negative except for those stated in the history of present illness  Past Medical History  Diagnosis Date  . Anxiety   . Tobacco abuse   . Cataract     bilateral  . SVD (spontaneous vaginal delivery)     x 3  . Bronchitis     uses inhaler if needed for bronchitis, lasted used -2014  . GERD (gastroesophageal reflux disease)   . History of blood transfusion 12/08/1966    at birth in [place], [place], unsure number of units  . Diabetes mellitus without complication ([place])     taken off Metformin since 04/2014, HgbA1C - normal, will follow up with PCP- Dr. Deborra Medina, 07/2014  . H/O exercise stress test 2011    done at [facility]- told that it was WNL, done due to pt. having panic attacks   . Chronic headaches     migraines - in  past, uses Phenergan for nausea   . Poor dentition     very poor oral health     Past Surgical History  Procedure Laterality Date  . Appendectomy  1988  . Tubal ligation    . Esophagogastroduodenoscopy  12/28/2013  . Knee arthroscopy  1995    left  . Colonoscopy  02/16/2014    normal   . Laparoscopic assisted vaginal hysterectomy N/A 04/15/2014    Procedure: LAPAROSCOPIC ASSISTED VAGINAL HYSTERECTOMY;  Surgeon: Marylynn Pearson, MD;  Location: [facility];  Service: Gynecology;  Laterality: N/A;  . Laparoscopic bilateral salpingo oopherectomy Bilateral 04/15/2014    Procedure: LAPAROSCOPIC BILATERAL SALPINGO OOPHORECTOMY;  Surgeon: Marylynn Pearson, MD;  Location: [facility];  Service: Gynecology;  Laterality: Bilateral;  . Abdominal hysterectomy    . Cholecystectomy N/A 06/07/2014    Procedure: LAPAROSCOPIC CHOLECYSTECTOMY;  Surgeon: Ralene Ok, MD;  Location: [facility];  Service: General;  Laterality: N/A;  . Cholecystectomy  June 07 2014    Family History  Problem Relation Age of Onset  . Diabetes Mother   . Hyperlipidemia Mother   . Hypertension Mother   . Colon cancer Neg Hx   . Rectal cancer Neg Hx   . Stomach cancer Neg Hx   . Prostate cancer Father   . Lung cancer Maternal Grandfather     smoked  . Emphysema Maternal Grandfather  smoked  . COPD Maternal Grandmother     never smoked   . Prostate cancer Father   . Thyroid cancer Paternal Aunt     Social History:  reports that she has been smoking Cigarettes and E-cigarettes.  She has a 52.5 pack-year smoking history. She has never used smokeless tobacco. She reports that she does not drink alcohol or use illicit drugs.  Allergies:  Allergies  Allergen Reactions  . Penicillins Anaphylaxis    REACTION: Throat swells  . Topamax [Topiramate] Other (See Comments)    Hands and feet to go numb    Medications: I have reviewed the patient's current medications.  Results for orders placed or performed during the hospital  encounter of 04/27/16 (from the past 48 hour(s))  Protime-INR     Status: None   Collection Time: 04/27/16  1:00 PM  Result Value Ref Range   Prothrombin Time 13.5 11.6 - 15.2 seconds   INR 1.01 0.00 - 1.49  APTT     Status: None   Collection Time: 04/27/16  1:00 PM  Result Value Ref Range   aPTT 29 24 - 37 seconds  CBC     Status: Abnormal   Collection Time: 04/27/16  1:00 PM  Result Value Ref Range   WBC 11.1 (H) 4.0 - 10.5 K/uL   RBC 4.51 3.87 - 5.11 MIL/uL   Hemoglobin 13.9 12.0 - 15.0 g/dL   HCT 41.9 36.0 - 46.0 %   MCV 92.9 78.0 - 100.0 fL   MCH 30.8 26.0 - 34.0 pg   MCHC 33.2 30.0 - 36.0 g/dL   RDW 14.0 11.5 - 15.5 %   Platelets 379 150 - 400 K/uL  Differential     Status: None   Collection Time: 04/27/16  1:00 PM  Result Value Ref Range   Neutrophils Relative % 62 %   Neutro Abs 6.8 1.7 - 7.7 K/uL   Lymphocytes Relative 29 %   Lymphs Abs 3.2 0.7 - 4.0 K/uL   Monocytes Relative 7 %   Monocytes Absolute 0.8 0.1 - 1.0 K/uL   Eosinophils Relative 2 %   Eosinophils Absolute 0.3 0.0 - 0.7 K/uL   Basophils Relative 0 %   Basophils Absolute 0.0 0.0 - 0.1 K/uL  Comprehensive metabolic panel     Status: Abnormal   Collection Time: 04/27/16  1:00 PM  Result Value Ref Range   Sodium 140 135 - 145 mmol/L   Potassium 3.5 3.5 - 5.1 mmol/L   Chloride 107 101 - 111 mmol/L   CO2 23 22 - 32 mmol/L   Glucose, Bld 155 (H) 65 - 99 mg/dL   BUN 12 6 - 20 mg/dL   Creatinine, Ser 0.86 0.44 - 1.00 mg/dL   Calcium 9.0 8.9 - 10.3 mg/dL   Total Protein 6.6 6.5 - 8.1 g/dL   Albumin 3.6 3.5 - 5.0 g/dL   AST 20 15 - 41 U/L   ALT 18 14 - 54 U/L   Alkaline Phosphatase 95 38 - 126 U/L   Total Bilirubin 0.5 0.3 - 1.2 mg/dL   GFR calc non Af Amer >60 >60 mL/min   GFR calc Af Amer >60 >60 mL/min    Comment: (NOTE) The eGFR has been calculated using the CKD EPI equation. This calculation has not been validated in all clinical situations. eGFR's persistently <60 mL/min signify possible  Chronic Kidney Disease.    Anion gap 10 5 - 15  Ethanol     Status: None  Collection Time: 04/27/16  1:50 PM  Result Value Ref Range   Alcohol, Ethyl (B) <5 <5 mg/dL    Comment:        LOWEST DETECTABLE LIMIT FOR SERUM ALCOHOL IS 5 mg/dL FOR MEDICAL PURPOSES ONLY   I-stat troponin, ED (not at [facility], [facility])     Status: None   Collection Time: 04/27/16  1:51 PM  Result Value Ref Range   Troponin i, poc 0.00 0.00 - 0.08 ng/mL   Comment 3            Comment: Due to the release kinetics of cTnI, a negative result within the first hours of the onset of symptoms does not rule out myocardial infarction with certainty. If myocardial infarction is still suspected, repeat the test at appropriate intervals.   I-Stat Chem 8, ED  (not at [facility], [facility])     Status: Abnormal   Collection Time: 04/27/16  1:53 PM  Result Value Ref Range   Sodium 142 135 - 145 mmol/L   Potassium 3.4 (L) 3.5 - 5.1 mmol/L   Chloride 103 101 - 111 mmol/L   BUN 13 6 - 20 mg/dL   Creatinine, Ser 0.80 0.44 - 1.00 mg/dL   Glucose, Bld 148 (H) 65 - 99 mg/dL   Calcium, Ion 1.11 (L) 1.12 - 1.23 mmol/L   TCO2 23 0 - 100 mmol/L   Hemoglobin 15.3 (H) 12.0 - 15.0 g/dL   HCT 45.0 36.0 - 46.0 %  Urinalysis, Routine w reflex microscopic (not at [facility])     Status: None   Collection Time: 04/27/16  4:22 PM  Result Value Ref Range   Color, Urine YELLOW YELLOW   APPearance CLEAR CLEAR   Specific Gravity, Urine 1.013 1.005 - 1.030   pH 6.0 5.0 - 8.0   Glucose, UA NEGATIVE NEGATIVE mg/dL   Hgb urine dipstick NEGATIVE NEGATIVE   Bilirubin Urine NEGATIVE NEGATIVE   Ketones, ur NEGATIVE NEGATIVE mg/dL   Protein, ur NEGATIVE NEGATIVE mg/dL   Nitrite NEGATIVE NEGATIVE   Leukocytes, UA NEGATIVE NEGATIVE    Comment: MICROSCOPIC NOT DONE ON URINES WITH NEGATIVE PROTEIN, BLOOD, LEUKOCYTES, NITRITE, OR GLUCOSE <1000 mg/dL.    Ct Head Wo Contrast  04/27/2016  CLINICAL DATA:  Headache, right-sided numbness and leg heaviness. EXAM: CT HEAD  WITHOUT CONTRAST TECHNIQUE: Contiguous axial images were obtained from the base of the skull through the vertex without intravenous contrast. COMPARISON:  CT scan of July 02, 2010. FINDINGS: Bony calvarium appears intact. No mass effect or midline shift is noted. Ventricular size is within normal limits. There is no evidence of mass lesion, hemorrhage or acute infarction. IMPRESSION: Normal head CT. These results were called by telephone at the time of interpretation on 04/27/2016 at 2:07 pm to Dr. Cordie Grice, who verbally acknowledged these results. Electronically Signed   By: Marijo Conception, M.D.   On: 04/27/2016 14:08      ROS Blood pressure 122/67, pulse 75, temperature 98.1 F (36.7 C), temperature source Oral, resp. rate 17, height _0  (1.575 m), weight 160 lb (Ashley [name].576 kg), last menstrual period 02/12/2014, SpO2 100 %. Physical Exam  .Pleasant middle-age Caucasian lady Afebrile. Head is nontraumatic. Neck is supple without bruit.    Cardiac exam no murmur or gallop. Lungs are clear to auscultation. Distal pulses are well felt.  Neurological Exam : Awake alert oriented 3 with normal speech and language function.  No aphasia or apraxia dysarthria. Pupils are equal reactive. Fundi were not visualized. Vision acuity  seems adequate. Face is symmetric without weakness.Extraocular movements are full range without nystagmus. Visual fields are full to bedside confrontational testing.  Tongue is midline.  Motor system exam reveals poor effort and variable drift with nonorganic features in the right upper and lower extremities. She has symmetric and normal tone on both sides. Finger-to-nose and needle coordination is slow but accurate. She has subjective diminished touch and pinprick sensation in the right hemibody including the forehead and spreading of the midline.  She also splits the forehead for vibration sensation. Plantars are downgoing. Gait was not tested. NIH Stroke scale 3. Modified Rankin at  baseline 1  Assessment/Plan: 50 year old Caucasian lady with right and hemibody numbness and heaviness in the setting of severe headache for 2 days with nonorganic features on exam possibly complicated migraine Versus conversion disorder. Doubt this is a left brain subcortical infarct. Inpatient deficits are mild and not severe enough to warrant TPA Plan:. Check MRI scan of the brain   Recommend Toradol 30 mg IV and Reglan 10 mg to treat her headaches. If MRI shows definite stroke she may be admitted for further evaluation.I had a long discussion with the patient with regards to my evaluation and treatment plan and she is in agreement and answered questions. Discussed with Dr. Kathrynn Humble as well.  SETHI,PRAMOD 04/27/2016, 4:52 PM    Note: This document was prepared with digital dictation and possible smart phrase technology. Any transcriptional errors that result from this process are unintentional.

## 2016-04-27 NOTE — ED Notes (Signed)
Pt returnas from MRI

## 2016-04-27 NOTE — ED Notes (Signed)
Pt to mri 

## 2016-04-30 ENCOUNTER — Ambulatory Visit (INDEPENDENT_AMBULATORY_CARE_PROVIDER_SITE_OTHER): Payer: BLUE CROSS/BLUE SHIELD | Admitting: Family Medicine

## 2016-05-02 ENCOUNTER — Ambulatory Visit (INDEPENDENT_AMBULATORY_CARE_PROVIDER_SITE_OTHER): Payer: BLUE CROSS/BLUE SHIELD | Admitting: Family Medicine

## 2016-05-02 ENCOUNTER — Encounter: Payer: Self-pay | Admitting: Family Medicine

## 2016-05-02 VITALS — BP 116/62 | HR 90 | Temp 97.9°F | Wt 160.2 lb

## 2016-05-02 DIAGNOSIS — R202 Paresthesia of skin: Secondary | ICD-10-CM | POA: Insufficient documentation

## 2016-05-02 DIAGNOSIS — G43119 Migraine with aura, intractable, without status migrainosus: Secondary | ICD-10-CM

## 2016-05-02 NOTE — Progress Notes (Signed)
Subjective:   Patient ID: Ashley [name], female    DOB: Apr 24, 1966, 50 y.o.   MRN: [id number]  Ashley [name] is a pleasant 50 y.o. year old female who presents to clinic today with Hospitalization Follow-up  on 05/02/2016  HPI:  Was seen in ER on 04/27/16 for acute onset of right arm numbness and right leg heaviness with headache.  Notes reviewed.  Was seen by neuro in ED.  Also followed as an outpatient by Dr. Delice Lesch.  Head CT neg. MRI negative for acute infarct. Did show some sinus swelling/opacity.  Not given IV tPA given mild symptoms.  Dr. Leonie Man, neurologist, advised toradol and reglan as he suspects this is due to migraines.  Arm still feels "heavy," and does not feel like her leg is the same either.  Less tingling and improved but still having symptoms.  Does not see neurologist, Dr. Delice Lesch, but no until July 17th.  Ct Head Wo Contrast  04/27/2016  CLINICAL DATA:  Headache, right-sided numbness and leg heaviness. EXAM: CT HEAD WITHOUT CONTRAST TECHNIQUE: Contiguous axial images were obtained from the base of the skull through the vertex without intravenous contrast. COMPARISON:  CT scan of July 02, 2010. FINDINGS: Bony calvarium appears intact. No mass effect or midline shift is noted. Ventricular size is within normal limits. There is no evidence of mass lesion, hemorrhage or acute infarction. IMPRESSION: Normal head CT. These results were called by telephone at the time of interpretation on 04/27/2016 at 2:07 pm to Dr. Cordie Grice, who verbally acknowledged these results. Electronically Signed   By: Marijo Conception, M.D.   On: 04/27/2016 14:08   Mr Brain Wo Contrast  04/27/2016  CLINICAL DATA:  50 year old diabetic hypertensive female with sudden onset of right arm numbness and right leg heaviness this morning. Worsening headache over the past 2 days. History migraines. Subsequent encounter. EXAM: MRI HEAD WITHOUT CONTRAST TECHNIQUE: Multiplanar, multiecho pulse sequences of the brain and  surrounding structures were obtained without intravenous contrast. COMPARISON:  04/27/2016 and 07/02/2010 head CT. 11/10/2014 brain MR. FINDINGS: No acute infarct or intracranial hemorrhage. No intracranial mass lesion noted on this unenhanced exam. No age advanced atrophy hydrocephalus. Nonspecific posterior superior left calvarial lesion with fat intensity may represent small hemangioma without significant change from 2011. Cervical medullary junction, pituitary region, pineal region and orbital structures unremarkable. Major intracranial vascular structures are patent. Partial opacification right mastoid air cells once again noted without obstructing lesion of the eustachian tube noted. Minimal mucosal thickening ethmoid sinus air cells and right sphenoid sinus. IMPRESSION: No acute infarct. Persistent mild opacification right mastoid air cells and minimal paranasal sinus mucosal thickening. Electronically Signed   By: Genia Del M.D.   On: 04/27/2016 18:05   Mr Knee Left  Wo Contrast  04/12/2016  CLINICAL DATA:  Left knee pain. Anterior pain for 3 weeks. Worse with full extension. EXAM: MRI OF THE LEFT KNEE WITHOUT CONTRAST TECHNIQUE: Multiplanar, multisequence MR imaging of the knee was performed. No intravenous contrast was administered. COMPARISON:  None. FINDINGS: MENISCI Medial meniscus:  Intact. Lateral meniscus:  Intact. LIGAMENTS Cruciates:  Intact ACL and PCL. Collaterals: Medial collateral ligament is intact. Lateral collateral ligament complex is intact. CARTILAGE Patellofemoral: Mild chondromalacia of the trochlear groove with mild subchondral reactive marrow changes. Mild chondromalacia of the periphery of the medial patellar facet. Medial:  No focal chondral defect. Lateral:  No focal chondral defect. Joint: Small joint effusion. Mild edema in Hoffa's fat. No plical thickening. Popliteal Fossa:  No Baker cyst.  Intact popliteus tendon. Extensor Mechanism:  Intact. Bones: No other focal marrow  signal abnormality. No acute fracture or dislocation. IMPRESSION: 1. No ligamentous or meniscal injury of the left knee. 2. Mild edema in Hoffa's fat which is nonspecific and may be secondary to inflammation versus direct trauma. 3. Mild chondromalacia of the trochlear groove with mild subchondral reactive marrow changes. Electronically Signed   By: Kathreen Devoid   On: 04/12/2016 11:33     Current Outpatient Prescriptions on File Prior to Visit  Medication Sig Dispense Refill  . amitriptyline (ELAVIL) 50 MG tablet Take 1-1/2 tablet at dinner time (Patient taking differently: Take 75 mg by mouth at bedtime. Take 1-1/2 tablet at dinner time) 45 tablet 3  . chlorpheniramine (CHLOR-TRIMETON) 4 MG tablet Take 4 mg by mouth every 6 (six) hours as needed for allergies.     . clonazePAM (KLONOPIN) 1 MG tablet Take 1 tablet (1 mg total) by mouth 3 (three) times daily as needed for anxiety. 90 tablet 0  . dicyclomine (BENTYL) 10 MG capsule TAKE ONE CAPSULE BY MOUTH TWICE DAILY AS NEEDED FOR  CRAMPING  AND  ABDOMINAL  PAIN 60 capsule 0  . meloxicam (MOBIC) 15 MG tablet Take 1 tablet (15 mg total) by mouth daily. Take with food for low back pain 30 tablet 3  . metFORMIN (GLUCOPHAGE) 500 MG tablet Take 1 tablet (500 mg total) by mouth daily with breakfast. 90 tablet 1  . omeprazole (PRILOSEC) 20 MG capsule Take 20 mg by mouth 2 (two) times daily at 8 am and 10 pm.    . polyethylene glycol powder (GLYCOLAX/MIRALAX) powder Take 17 g by mouth daily as needed for moderate constipation. Until daily soft stools  OTC    . promethazine (PHENERGAN) 25 MG tablet Take 1 tablet (25 mg total) by mouth every 6 (six) hours as needed for nausea or vomiting. 30 tablet 0  . Respiratory Therapy Supplies (FLUTTER) DEVI Use as directed 1 each 0  . rizatriptan (MAXALT-MLT) 10 MG disintegrating tablet Take 1 tablet (10 mg total) by mouth as needed for migraine. May repeat in 2 hours if needed. Do not take more than 3 tablets in a  week. 10 tablet 3  . traZODone (DESYREL) 150 MG tablet Take 1 tablet (150 mg total) by mouth at bedtime. 90 tablet 1  . verapamil (CALAN) 40 MG tablet TAKE ONE TABLET BY MOUTH DAILY 30 tablet 5   No current facility-administered medications on file prior to visit.    Allergies  Allergen Reactions  . Penicillins Anaphylaxis    REACTION: Throat swells  . Topamax [Topiramate] Other (See Comments)    Hands and feet to go numb    Past Medical History  Diagnosis Date  . Anxiety   . Tobacco abuse   . Cataract     bilateral  . SVD (spontaneous vaginal delivery)     x 3  . Bronchitis     uses inhaler if needed for bronchitis, lasted used -2014  . GERD (gastroesophageal reflux disease)   . History of blood transfusion [birth year], 1966    at birth in [place], [place], unsure number of units  . Diabetes mellitus without complication ([place])     taken off Metformin since 04/2014, HgbA1C - normal, will follow up with PCP- Dr. Deborra Medina, 07/2014  . H/O exercise stress test 2011    done at [facility]- told that it was WNL, done due to pt. having panic attacks   .  Chronic headaches     migraines - in past, uses Phenergan for nausea   . Poor dentition     very poor oral health     Past Surgical History  Procedure Laterality Date  . Appendectomy  1988  . Tubal ligation    . Esophagogastroduodenoscopy  12/28/2013  . Knee arthroscopy  1995    left  . Colonoscopy  02/16/2014    normal   . Laparoscopic assisted vaginal hysterectomy N/A 04/15/2014    Procedure: LAPAROSCOPIC ASSISTED VAGINAL HYSTERECTOMY;  Surgeon: Marylynn Pearson, MD;  Location: [facility];  Service: Gynecology;  Laterality: N/A;  . Laparoscopic bilateral salpingo oopherectomy Bilateral 04/15/2014    Procedure: LAPAROSCOPIC BILATERAL SALPINGO OOPHORECTOMY;  Surgeon: Marylynn Pearson, MD;  Location: [facility];  Service: Gynecology;  Laterality: Bilateral;  . Abdominal hysterectomy    . Cholecystectomy N/A 06/07/2014    Procedure: LAPAROSCOPIC  CHOLECYSTECTOMY;  Surgeon: Ralene Ok, MD;  Location: [facility];  Service: General;  Laterality: N/A;  . Cholecystectomy  June 07 2014    Family History  Problem Relation Age of Onset  . Diabetes Mother   . Hyperlipidemia Mother   . Hypertension Mother   . Colon cancer Neg Hx   . Rectal cancer Neg Hx   . Stomach cancer Neg Hx   . Prostate cancer Father   . Lung cancer Maternal Grandfather     smoked  . Emphysema Maternal Grandfather     smoked  . COPD Maternal Grandmother     never smoked   . Prostate cancer Father   . Thyroid cancer Paternal Aunt     Social History   Social History  . Marital Status: Married    Spouse Name: N/A  . Number of Children: 6  . Years of Education: N/A   Occupational History  .       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 Smoking status: Heavy Tobacco Smoker -- 1.50 packs/day for 35 years    Types: Cigarettes, E-cigarettes    Last Attempt to Quit: 02/02/2014  . Smokeless tobacco: Never Used  . Alcohol Use: No  . Drug Use: No  . Sexual Activity: Yes    Birth Control/ Protection: Pill   Other Topics Concern  . Not on file   Social History Narrative   5 living children, one child is a crack cocaine addict who often breaks into their house to steal money. One child died of cerebral palsy.   The PMH, PSH, Social History, Family History, Medications, and allergies have been reviewed in [facility], and have been updated if relevant.    Review of Systems  Constitutional: Negative.   Eyes: Negative for visual disturbance.  Neurological: Positive for dizziness, numbness and headaches. Negative for tremors, seizures, syncope, facial asymmetry, speech difficulty, weakness and light-headedness.  All other systems reviewed and are negative.      Objective:    BP 116/62 mmHg  Pulse 90  Temp(Src) 97.9 F (36.6 C) (Oral)  Wt 160 lb 4 oz (72.689 kg)  SpO2 97%  LMP 02/12/2014   Physical Exam  Constitutional: She  appears well-developed and well-nourished. No distress.  HENT:  Head: Normocephalic and atraumatic.  Eyes: Conjunctivae are normal.  Cardiovascular: Normal rate.   Pulmonary/Chest: Effort normal.  Musculoskeletal: Normal range of motion.  Neurological: She has normal reflexes. No cranial nerve deficit or sensory deficit.  Decreased grip strength right  Skin: Skin is warm and dry. She is not diaphoretic.  Psychiatric: She has a normal mood and affect. Her behavior is normal. Judgment and thought content normal.  Nursing note and vitals reviewed.         Assessment & Plan:   Intractable migraine with aura without status migrainosus - Plan: Ambulatory referral to Neurology  Paresthesia of right arm and leg - Plan: Ambulatory referral to Neurology No Follow-up on file.

## 2016-05-02 NOTE — Progress Notes (Signed)
Pre visit review using our clinic review tool, if applicable. No additional management support is needed unless otherwise documented below in the visit note. 

## 2016-05-02 NOTE — Patient Instructions (Signed)
Good to see you. Please stop by to see Ashley [name] on your way out. 

## 2016-05-02 NOTE — Assessment & Plan Note (Signed)
With abnormal findings on physical exam. MRI and CT neg for acute infarct. Pt is concerned and uncomfortable with waiting for neuro appt in July.  I can understand this.  MRI was neg but was without contrast which could miss some pathology such as demyelinating disease. Will place neuro referral to see if we can get her to be seen sooner. The patient indicates understanding of these issues and agrees with the plan.

## 2016-05-03 ENCOUNTER — Ambulatory Visit: Payer: BLUE CROSS/BLUE SHIELD | Admitting: Family Medicine

## 2016-05-10 ENCOUNTER — Ambulatory Visit (INDEPENDENT_AMBULATORY_CARE_PROVIDER_SITE_OTHER): Payer: BLUE CROSS/BLUE SHIELD | Admitting: Neurology

## 2016-05-10 ENCOUNTER — Institutional Professional Consult (permissible substitution): Payer: BLUE CROSS/BLUE SHIELD | Admitting: Family Medicine

## 2016-05-10 ENCOUNTER — Encounter: Payer: Self-pay | Admitting: Neurology

## 2016-05-10 VITALS — BP 114/71 | HR 74 | Ht 62.0 in | Wt 165.0 lb

## 2016-05-10 DIAGNOSIS — R413 Other amnesia: Secondary | ICD-10-CM | POA: Diagnosis not present

## 2016-05-10 DIAGNOSIS — G43711 Chronic migraine without aura, intractable, with status migrainosus: Secondary | ICD-10-CM

## 2016-05-10 MED ORDER — LAMOTRIGINE 25 MG PO TABS: ORAL_TABLET | ORAL | Status: DC

## 2016-05-10 NOTE — Progress Notes (Addendum)
[facility]    Provider:  Dr Jaynee Eagles Referring Provider: Lucille Passy, MD Primary Care Physician:  Arnette Norris, MD  CC:  Migraine  HPI:  Ashley [name] is a 50 y.o. female here as a referral from Dr. Deborra Medina for once visit. Past medical history of intractable migraine, prediabetes, depression, insomnia, anxiety, back pain, significant psychiatric stressors. She was diagnosed with cluster headaches by Dr. Delice Lesch and is being treated with vearapamil, she also has migraines.  She sees Dr. Delice Lesch and she will be going back, Dr. Delice Lesch is on maternity leave at this time. She is here for evaluation of TIA over she was seen in the emergency room. Exam was consistent with nonorganic features in the right upper and lower extremities with splitting of vibration at the forehead. MRI of the brain was negative and she was sent home. Patient is here with her husband today who provides information as well. Patient says she ate a biscuit and then she started feelig weird and "numby and heavy" and the whole right side started feeling tingling. She still has the symptoms. She was seen in the emergency room, she was given shots. She was having headaches before this happened. No head trauma, symptoms started on her couch about 11am acute onset. She has continued tingly feelings on the right side, she says the symptoms are exactly just on the right side of her body, she says if you drqw a line directly down the middle of her body fron the face to the chest abdomen and pelvis exactly down the middle the whole right side doesn't feel right. She has a daily continuous headache since this episode. She had started having a very bad headache before the right-sided symptoms. She is getting confused. She says she is having memory problems as well. Daily continuous headaches for years,  +phono/photophobiua, +nausea.. The headaches on the right side, throbbing and stabbing, she has light sensitivity, sounds sensitivity,  nausea, vomiting. She has to sit in a dark room. It is continuous. She has 30 headaches out of month continuous, 20 or more are migrainous, can be as bad as a 10/10 and on average it is 6/10. No aura. No medication overuse. Denies lacrimation, injection, other signs or symptoms of cluster at this time. The right-sided symptoms occur with the migraines.   Reviewed notes, labs and imaging from outside physicians, which showed:   Reviewed notes from patient's emergency room visit that she is here to evaluate for: She was assessed by Dr. Leonie Man personally in the emergency room. Motor system exam revealed poor effort variable drift with nonorganic features in the right upper and lower extremities. Patient split midline with vibration. NH stroke scale was 3. TPA was not given. MRI of the brain was negative for acute event as was CT of the head. She was given Toradol IV and Reglan to treat her headaches. She was sent home and not admitted.  Patient also been evaluated by Dr. Delice Lesch at [facility] in 2016 for worsening headaches with memory changes and paresthesias. 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  Medications tried and failed: Verapamil, amitriptyline, Topamax, reglan, zofran, Relpax, Imitrex, a course of steroids, could not obtain triptan nasal spray due to insurance issues.   MRI of the brain 04/27/2016 (personally reviewed imaging and agree with the  following): No acute infarct or intracranial hemorrhage.  No intracranial mass lesion noted on this unenhanced exam.  No age advanced atrophy hydrocephalus.  Nonspecific posterior superior left calvarial lesion with fat intensity may represent small hemangioma without significant change from 2011.  Cervical medullary junction, pituitary region, pineal region  and orbital structures unremarkable.  Major intracranial vascular structures are patent.  Partial opacification right mastoid air cells once again noted without obstructing lesion of the eustachian tube noted. Minimal mucosal thickening ethmoid sinus air cells and right sphenoid sinus.  IMPRESSION: No acute infarct.  Persistent mild opacification right mastoid air cells and minimal paranasal sinus mucosal thickening.  Urine rapid drug screen was negative, CBC showed elevated white blood cells 11.1, CMP was normal except for elevated glucose at 155.   Review of Systems: Patient complains of symptoms per HPI as well as the following symptoms: Fatigue, blurred vision, ringing in ears, joint pain, confusion, headache, numbness, weakness, dizziness, insomnia, anxiety and decreased energy. Pertinent negatives per HPI. All others negative.   Social History   Social History  . Marital Status: Married    Spouse Name: Alvester Chou  . Number of Children: 6  . Years of Education: 12   Occupational History  .       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 Smoking status: Heavy Tobacco Smoker -- 1.00 packs/day for 35 years    Types: Cigarettes, E-cigarettes    Last Attempt to Quit: 02/02/2014  . Smokeless tobacco: Never Used  . Alcohol Use: No  . Drug Use: No  . Sexual Activity: Yes    Birth Control/ Protection: Pill   Other Topics Concern  . Not on file   Social History Narrative   5 living children, one child is a crack cocaine addict who often breaks into their house to steal money. One child died of cerebral palsy.   Caffeine use: Dr Malachi Bonds (3 per day)   2 cups coffee per day    Family History  Problem Relation Age of Onset  . Diabetes Mother   . Hyperlipidemia Mother   . Hypertension Mother   . Colon cancer Neg Hx   . Rectal cancer Neg Hx   . Stomach cancer Neg Hx   . Migraines Neg Hx   . Dementia Neg Hx   . Lung cancer Maternal Grandfather      smoked  . Emphysema Maternal Grandfather     smoked  . COPD Maternal Grandmother     never smoked   . Prostate cancer Father   . Thyroid cancer Paternal Aunt     Past Medical History  Diagnosis Date  . Anxiety   . Tobacco abuse   . Cataract     bilateral  . SVD (spontaneous vaginal delivery)     x 3  . Bronchitis     uses inhaler if needed for bronchitis, lasted used -2014  . GERD (gastroesophageal reflux disease)   . History of blood transfusion 07[birth year]18[birth year]66    at birth in [place], [place], unsure number of units  . Diabetes mellitus without complication (DISH)     taken off Metformin since 04/2014, HgbA1C - normal, will follow up with PCP- Dr. Deborra Medina, 07/2014  . H/O exercise stress test 2011    done at [facility]- told that it was WNL, done due to pt. having panic attacks   . Chronic headaches     migraines - in past, uses Phenergan for nausea   .  Poor dentition     very poor oral health     Past Surgical History  Procedure Laterality Date  . Appendectomy  1988  . Tubal ligation    . Esophagogastroduodenoscopy  12/28/2013  . Knee arthroscopy  1995    left  . Colonoscopy  02/16/2014    normal   . Laparoscopic assisted vaginal hysterectomy N/A 04/15/2014    Procedure: LAPAROSCOPIC ASSISTED VAGINAL HYSTERECTOMY;  Surgeon: Marylynn Pearson, MD;  Location: [facility];  Service: Gynecology;  Laterality: N/A;  . Laparoscopic bilateral salpingo oopherectomy Bilateral 04/15/2014    Procedure: LAPAROSCOPIC BILATERAL SALPINGO OOPHORECTOMY;  Surgeon: Marylynn Pearson, MD;  Location: [facility];  Service: Gynecology;  Laterality: Bilateral;  . Abdominal hysterectomy    . Cholecystectomy N/A 06/07/2014    Procedure: LAPAROSCOPIC CHOLECYSTECTOMY;  Surgeon: Ralene Ok, MD;  Location: [facility];  Service: General;  Laterality: N/A;  . Cholecystectomy  June 07 2014    Current Outpatient Prescriptions  Medication Sig Dispense Refill  . amitriptyline (ELAVIL) 50 MG tablet Take 1-1/2 tablet at dinner time  (Patient taking differently: Take 75 mg by mouth at bedtime. Take 1-1/2 tablet at dinner time) 45 tablet 3  . chlorpheniramine (CHLOR-TRIMETON) 4 MG tablet Take 4 mg by mouth every 6 (six) hours as needed for allergies.     . clonazePAM (KLONOPIN) 1 MG tablet Take 1 tablet (1 mg total) by mouth 3 (three) times daily as needed for anxiety. 90 tablet 0  . dicyclomine (BENTYL) 10 MG capsule TAKE ONE CAPSULE BY MOUTH TWICE DAILY AS NEEDED FOR  CRAMPING  AND  ABDOMINAL  PAIN 60 capsule 0  . meloxicam (MOBIC) 15 MG tablet Take 1 tablet (15 mg total) by mouth daily. Take with food for low back pain 30 tablet 3  . metFORMIN (GLUCOPHAGE) 500 MG tablet Take 1 tablet (500 mg total) by mouth daily with breakfast. 90 tablet 1  . omeprazole (PRILOSEC) 20 MG capsule Take 20 mg by mouth 2 (two) times daily at 8 am and 10 pm.    . polyethylene glycol powder (GLYCOLAX/MIRALAX) powder Take 17 g by mouth daily as needed for moderate constipation. Until daily soft stools  OTC    . promethazine (PHENERGAN) 25 MG tablet Take 1 tablet (25 mg total) by mouth every 6 (six) hours as needed for nausea or vomiting. 30 tablet 0  . Respiratory Therapy Supplies (FLUTTER) DEVI Use as directed 1 each 0  . rizatriptan (MAXALT-MLT) 10 MG disintegrating tablet Take 1 tablet (10 mg total) by mouth as needed for migraine. May repeat in 2 hours if needed. Do not take more than 3 tablets in a week. 10 tablet 3  . traZODone (DESYREL) 150 MG tablet Take 1 tablet (150 mg total) by mouth at bedtime. 90 tablet 1  . verapamil (CALAN) 40 MG tablet TAKE ONE TABLET BY MOUTH DAILY 30 tablet 5  . lamoTRIgine (LAMICTAL) 25 MG tablet Wk 1: one pill by mouth at night every day Wk 2: Take one pill by mouth twice daily Wk 3: Take two pills by mouth every night and 1 pill every morning  Wk 4: Take two pills by mouth twice daily Wk 5: Take three pills every night and two pills every morning  Wk 6: three pills twice daily Wk 7: four pills every night and  take three pills every morning  Wk 8: four pills twice daily Stop for rash. 240 tablet 6   No current facility-administered medications for this visit.  Allergies as of 05/10/2016 - Review Complete 05/10/2016  Allergen Reaction Noted  . Penicillins Anaphylaxis   . Topamax [topiramate] Other (See Comments) 02/12/2014    Vitals: BP 114/71 mmHg  Pulse 74  Ht 5\' 2"  (1.575 m)  Wt 165 lb (74.844 kg)  BMI 30.17 kg/m2  LMP 02/12/2014 Last Weight:  Wt Readings from Last 1 Encounters:  05/10/16 165 lb (74.844 kg)   Last Height:   Ht Readings from Last 1 Encounters:  05/10/16 5\' 2"  (1.575 m)   Physical exam: Exam: Gen: Sad affect                    CV: RRR, no MRG. No Carotid Bruits. No peripheral edema, warm, nontender Eyes: Conjunctivae clear without exudates or hemorrhage  Neuro: Detailed Neurologic Exam  Speech:    Speech is normal; fluent and spontaneous with normal comprehension.  Cognition:    The patient is oriented to person, place, and time;     recent and remote memory intact;     language fluent;     normal attention, concentration,     fund of knowledge Cranial Nerves:    The pupils are equal, round, and reactive to light. Attempted funduscopic exam but could not visualize due to small pupils. Visual fields are full to finger confrontation. Extraocular movements are intact. Trigeminal sensation is intact and the muscles of mastication are normal. Left lid swelling, otherwsieThe face is symmetric. The palate elevates in the midline. Hearing intact. Voice is normal. Shoulder shrug is normal. The tongue has normal motion without fasciculations.   Coordination:    Normal finger to nose and heel to shin.  Gait:    antalgic  Motor Observation:    No asymmetry, no atrophy, and no involuntary movements noted. Tone:    Normal muscle tone.    Posture:    Posture is normal. normal erect    Strength: giveway and poor effort on the right. Otherwise strength is V/V  in the upper and lower limbs.      Sensation: splits midline to pin prick and vibration     Reflex Exam:  DTR's:    Deep tendon reflexes in the upper and lower extremities are brisk bilaterally.   Toes:    The toes are downgoing bilaterally.   Clonus:    Clonus is absent.       Assessment/Plan:  50 y.o. female here as a referral from Dr. Deborra Medina for migraines. Past medical history of intractable migraine, prediabetes, depression, insomnia, anxiety, back pain. She was diagnosed with cluster headaches by Dr. Delice Lesch and ia being treated with verapamil, she likely has concomitant migraines as well. She was recently seen in the [facility] emergency room for acute onset right sided paresthesias within exam  not consistent with organic etiology of symptoms. Today's exam is also not consistent with organic cause, showing poor effort and give way on the right, splitting exactly midline with vibration and pinprick, appears to have significant psychiatric contribution to her symptoms. May be complicated migraines with a lot of embellishment.  -Highly recommend referral to psychiatry for depression and anxiety which I feel is significantly contributing to her symptoms. We'll place referral. -For her reported memory changes will check B12 and thyroid, suspect depression and anxiety may be contributory here as well. -For her migraine: We'll perform a migraine cocktail today in our infusion center. Discussed Depakote and maybe Lamictal. There is mixed evidence for migraine prevention with lamictal but sometimes we use it, especially  with so much psychiatric overlay.  But Depakote would also be a good option and has better migraine prevention evidence. Discussed Botox given that she has failed multiple medications for her migraines. She prefers Lamictal, given side effects to Depakote include increased weight gain. But i will switch her to Depakote if she does not improve on Lamictal in a relatively short time frame.    - continue treatment for cluster headaches as well.  To prevent or relieve headaches, try the following: Cool Compress. Lie down and place a cool compress on your head.  Avoid headache triggers. If certain foods or odors seem to have triggered your migraines in the past, avoid them. A headache diary might help you identify triggers.  Include physical activity in your daily routine. Try a daily walk or other moderate aerobic exercise.  Manage stress. Find healthy ways to cope with the stressors, such as delegating tasks on your to-do list.  Practice relaxation techniques. Try deep breathing, yoga, massage and visualization.  Eat regularly. Eating regularly scheduled meals and maintaining a healthy diet might help prevent headaches. Also, drink plenty of fluids.  Follow a regular sleep schedule. Sleep deprivation might contribute to headaches Consider biofeedback. With this mind-body technique, you learn to control certain bodily functions - such as muscle tension, heart rate and blood pressure - to prevent headaches or reduce headache pain.    Proceed to emergency room if you experience new or worsening symptoms or symptoms do not resolve, if you have new neurologic symptoms or if headache is severe, or for any concerning symptom.   Discussed at length Lamictal side effects including rash: WARNING/CAUTION: Even though it may be rare, some people may have very bad and sometimes deadly side effects when taking a drug. Tell your doctor or get medical help right away if you have any of the following signs or symptoms that may be related to a very bad side effect: . Signs of an allergic reaction, like rash; hives; itching; red, swollen, blistered, or peeling skin with or without fever; wheezing; tightness in the chest or throat; trouble breathing or talking; unusual hoarseness; or swelling of the mouth, face, lips, tongue, or throat. . Signs of infection like fever, chills, very bad sore throat, ear or  sinus pain, cough, more sputum or change in color of sputum, pain with passing urine, mouth sores, or wound that will not heal. . Signs of liver problems like dark urine, feeling tired, not hungry, upset stomach or stomach pain, light-colored stools, throwing up, or yellow skin or eyes. . Signs of kidney problems like unable to pass urine, change in how much urine is passed, blood in the urine, or a big weight gain. Marland Kitchen Shortness of breath, a big weight gain, or swelling in the arms or legs. . If seizures are worse or not the same after starting this drug. . Very bad muscle pain or weakness. . Very bad joint pain or swelling. . Any unexplained bruising or bleeding. . Change in eyesight. . Very bad dizziness or passing out. . Change in balance. . Not able to control eye movements. . Chest pain or pressure. . Flu-like signs. . This drug may raise the chance of a very bad brain problem called aseptic meningitis. Call your doctor right away if you have a headache, fever, chills, very upset stomach or throwing up, stiff neck, rash, bright lights bother your eyes, feeling sleepy, or feeling confused.  All drugs may cause side effects. However, many people have no  side effects or only have minor side effects. Call your doctor or get medical help if any of these side effects or any other side effects bother you or do not go away: . Dizziness. . Feeling sleepy. Marland Kitchen Upset stomach or throwing up. . Feeling tired or weak. . Shakiness. . Not able to sleep. . Runny nose. . Loose stools (diarrhea). These are not all of the side effects that may occur. If you have questions about side effects, call your doctor. Call your doctor for medical advice about side effects. You may report side effects to your national health agency     Sarina Ill, MD  [facility] [address] [place], [place] [address]  Phone [phone] Fax [phone]

## 2016-05-10 NOTE — Patient Instructions (Addendum)
Overall you are doing fairly well but I do want to suggest a few things today:   Remember to drink plenty of fluid, eat healthy meals and do not skip any meals. Try to eat protein with a every meal and eat a healthy snack such as fruit or nuts in between meals. Try to keep a regular sleep-wake schedule and try to exercise daily, particularly in the form of walking, 20-30 minutes a day, if you can.   As far as your medications are concerned, I would like to suggest:  - start Lamotrigine with a goal dose of 100mg  by mouth twice a day Week 1: Take 25mg  (one pill) by mouth at night every day Week 2: Take 25mg  (one pill) by mouth in the morning and night every day Week 3: Take 50mg  (two pills) by mouth every night and 25mg  every morning  Week 4: Take 50mg  (two pills) by mouth every night and every morning  Week 5: Take 75mg  (three pills) every night and take 50 mg (two pills) every morning  Week 6: Take 75mg  (three pills) every night and morning  Week 7: Take 100mg  (four pills) every night and take 75mg  (three pills) every morning  Week 8: Take 100mg  (four pills) every night and morning  Please call with any questions or if you develop a rash call immediately and stop taking the Lamotrigine.    As far as diagnostic testing: labs today  Please also call us for any test results so we can go over those with you on the phone.  My clinical assistant and will answer any of your questions and relay your messages to me and also relay most of my messages to you.   Our phone number is [phone]. We also have an after hours call service for urgent matters and there is a physician on-call for urgent questions. For any emergencies you know to call 911 or go to the nearest emergency room

## 2016-05-11 ENCOUNTER — Telehealth: Payer: Self-pay

## 2016-05-11 LAB — THYROID PANEL WITH TSH
FREE THYROXINE INDEX: 2.8 (ref 1.2–4.9)
T3 UPTAKE RATIO: 26 % (ref 24–39)
T4 TOTAL: 10.6 ug/dL (ref 4.5–12.0)
TSH: 1.87 u[IU]/mL (ref 0.450–4.500)

## 2016-05-11 LAB — B12 AND FOLATE PANEL
FOLATE: 4.2 ng/mL (ref >3.0)
Vitamin B-12: 338 pg/mL (ref 211–946)

## 2016-05-11 NOTE — Telephone Encounter (Signed)
I called patient and she is aware of results.

## 2016-05-11 NOTE — Telephone Encounter (Signed)
-----   Message from Melvenia Beam, MD sent at 05/11/2016  7:55 AM EDT ----- Labs look fine thanks

## 2016-05-13 ENCOUNTER — Encounter: Payer: Self-pay | Admitting: Neurology

## 2016-05-29 ENCOUNTER — Ambulatory Visit (INDEPENDENT_AMBULATORY_CARE_PROVIDER_SITE_OTHER): Payer: BLUE CROSS/BLUE SHIELD | Admitting: Primary Care

## 2016-05-29 ENCOUNTER — Encounter: Payer: Self-pay | Admitting: Primary Care

## 2016-05-29 VITALS — BP 144/80 | HR 86 | Temp 97.9°F | Ht 62.0 in | Wt 167.8 lb

## 2016-05-29 DIAGNOSIS — G43119 Migraine with aura, intractable, without status migrainosus: Secondary | ICD-10-CM | POA: Diagnosis not present

## 2016-05-29 MED ORDER — PROMETHAZINE HCL 25 MG/ML IJ SOLN
25.0000 mg | Freq: Once | INTRAMUSCULAR | Status: AC
  Administered 2016-05-29: 25 mg via INTRAMUSCULAR

## 2016-05-29 MED ORDER — KETOROLAC TROMETHAMINE 60 MG/2ML IM SOLN
60.0000 mg | Freq: Once | INTRAMUSCULAR | Status: AC
  Administered 2016-05-29: 60 mg via INTRAMUSCULAR

## 2016-05-29 NOTE — Patient Instructions (Addendum)
You were provided with an injection of Toradol and Phenergan today.  Please call your neurologist to notify them of your recent migraine.  Please notify us if no improvement in 24 hours.  It was a pleasure to see you today!  Migraine Headache A migraine headache is an intense, throbbing pain on one or both sides of your head. A migraine can last for 30 minutes to several hours. CAUSES  The exact cause of a migraine headache is not always known. However, a migraine may be caused when nerves in the brain become irritated and release chemicals that cause inflammation. This causes pain. Certain things may also trigger migraines, such as:  Alcohol.  Smoking.  Stress.  Menstruation.  Aged cheeses.  Foods or drinks that contain nitrates, glutamate, aspartame, or tyramine.  Lack of sleep.  Chocolate.  Caffeine.  Hunger.  Physical exertion.  Fatigue.  Medicines used to treat chest pain (nitroglycerine), birth control pills, estrogen, and some blood pressure medicines. SIGNS AND SYMPTOMS  Pain on one or both sides of your head.  Pulsating or throbbing pain.  Severe pain that prevents daily activities.  Pain that is aggravated by any physical activity.  Nausea, vomiting, or both.  Dizziness.  Pain with exposure to bright lights, loud noises, or activity.  General sensitivity to bright lights, loud noises, or smells. Before you get a migraine, you may get warning signs that a migraine is coming (aura). An aura may include:  Seeing flashing lights.  Seeing bright spots, halos, or zigzag lines.  Having tunnel vision or blurred vision.  Having feelings of numbness or tingling.  Having trouble talking.  Having muscle weakness. DIAGNOSIS  A migraine headache is often diagnosed based on:  Symptoms.  Physical exam.  A CT scan or MRI of your head. These imaging tests cannot diagnose migraines, but they can help rule out other causes of  headaches. TREATMENT Medicines may be given for pain and nausea. Medicines can also be given to help prevent recurrent migraines.  HOME CARE INSTRUCTIONS  Only take over-the-counter or prescription medicines for pain or discomfort as directed by your health care provider. The use of long-term narcotics is not recommended.  Lie down in a dark, quiet room when you have a migraine.  Keep a journal to find out what may trigger your migraine headaches. For example, write down:  What you eat and drink.  How much sleep you get.  Any change to your diet or medicines.  Limit alcohol consumption.  Quit smoking if you smoke.  Get 7-9 hours of sleep, or as recommended by your health care provider.  Limit stress.  Keep lights dim if bright lights bother you and make your migraines worse. SEEK IMMEDIATE MEDICAL CARE IF:   Your migraine becomes severe.  You have a fever.  You have a stiff neck.  You have vision loss.  You have muscular weakness or loss of muscle control.  You start losing your balance or have trouble walking.  You feel faint or pass out.  You have severe symptoms that are different from your first symptoms. MAKE SURE YOU:   Understand these instructions.  Will watch your condition.  Will get help right away if you are not doing well or get worse.   This information is not intended to replace advice given to you by your health care provider. Make sure you discuss any questions you have with your health care provider.   Document Released: 12/10/2005 Document Revised: 12/31/2014 Document Reviewed:  08/17/2013 Elsevier Interactive Patient Education [employer].

## 2016-05-29 NOTE — Progress Notes (Signed)
Subjective:    Patient ID: Ashley [name], female    DOB: Jun 29, 1966, 50 y.o.   MRN: [id number]  HPI  Ashley [name] is a 50 year old female with a history of migraines who presents today with a chief complaint of migraine. She is currently managed on amitriptyline, meloxicam, Lamictal, and Maxalt.   Her recent migraine has been present since yesterday morning. Her headache is located behind her right eye with radiation around to her right occipital lobe. She describes her pain as a constant stabbing and sharp pain. She reports nausea, photophobia, and phonophobia. Denies vomiting. She's taken phenergan and Maxalt at home without improvement. She is compliant to her daily medications.   She follows with neurology and should be starting botox injections soon. She has a driver present today.  Review of Systems  Constitutional: Negative for fever.  Eyes: Positive for photophobia.  Gastrointestinal: Positive for nausea. Negative for vomiting.  Neurological: Positive for headaches.       Past Medical History  Diagnosis Date  . Anxiety   . Tobacco abuse   . Cataract     bilateral  . SVD (spontaneous vaginal delivery)     x 3  . Bronchitis     uses inhaler if needed for bronchitis, lasted used -2014  . GERD (gastroesophageal reflux disease)   . History of blood transfusion [birth year]1966    at birth in [place], [place], unsure number of units  . Diabetes mellitus without complication ([place])     taken off Metformin since 04/2014, HgbA1C - normal, will follow up with PCP- Ashley [name], 07/2014  . H/O exercise stress test 2011    done at [name]- told that it was WNL, done due to pt. having panic attacks   . Chronic headaches     migraines - in past, uses Phenergan for nausea   . Poor dentition     very poor oral health      Social History   Social History  . Marital Status: Married    Spouse Name: Ashley [name]  . Number of Children: 6  . Years of Education: 12   Occupational History  .       helps  run a friends business   Social History Main Topics  . Smoking status: Heavy Tobacco Smoker -- 1.00 packs/day for 35 years    Types: Cigarettes, E-cigarettes    Last Attempt to Quit: 02/02/2014  . Smokeless tobacco: Never Used  . Alcohol Use: No  . Drug Use: No  . Sexual Activity: Yes    Birth Control/ Protection: Pill   Other Topics Concern  . Not on file   Social History Narrative   5 living children, one child is a crack cocaine addict who often breaks into their house to steal money. One child died of cerebral palsy.   Caffeine use: [name] (3 per day)   2 cups coffee per day    Past Surgical History  Procedure Laterality Date  . Appendectomy  1988  . Tubal ligation    . Esophagogastroduodenoscopy  12/28/2013  . Knee arthroscopy  1995    left  . Colonoscopy  02/16/2014    normal   . Laparoscopic assisted vaginal hysterectomy N/A 04/15/2014    Procedure: LAPAROSCOPIC ASSISTED VAGINAL HYSTERECTOMY;  Surgeon: Ashley Pearson, MD;  Location: [facility];  Service: Gynecology;  Laterality: N/A;  . Laparoscopic bilateral salpingo oopherectomy Bilateral 04/15/2014    Procedure: LAPAROSCOPIC BILATERAL SALPINGO OOPHORECTOMY;  Surgeon: Ashley Pearson, MD;  Location: [facility];  Service: Gynecology;  Laterality: Bilateral;  . Abdominal hysterectomy    . Cholecystectomy N/A 06/07/2014    Procedure: LAPAROSCOPIC CHOLECYSTECTOMY;  Surgeon: Ashley Ok, MD;  Location: [facility];  Service: General;  Laterality: N/A;  . Cholecystectomy  June 07 2014    Family History  Problem Relation Age of Onset  . Diabetes Mother   . Hyperlipidemia Mother   . Hypertension Mother   . Colon cancer Neg Hx   . Rectal cancer Neg Hx   . Stomach cancer Neg Hx   . Migraines Neg Hx   . Dementia Neg Hx   . Lung cancer Maternal Grandfather     smoked  . Emphysema Maternal Grandfather     smoked  . COPD Maternal Grandmother     never smoked   . Prostate cancer Father   . Thyroid cancer Paternal Aunt      Allergies  Allergen Reactions  . Penicillins Anaphylaxis    REACTION: Throat swells  . Topamax [Topiramate] Other (See Comments)    Hands and feet to go numb    Current Outpatient Prescriptions on File Prior to Visit  Medication Sig Dispense Refill  . amitriptyline (ELAVIL) 50 MG tablet Take 1-1/2 tablet at dinner time (Patient taking differently: Take 75 mg by mouth at bedtime. Take 1-1/2 tablet at dinner time) 45 tablet 3  . chlorpheniramine (CHLOR-TRIMETON) 4 MG tablet Take 4 mg by mouth every 6 (six) hours as needed for allergies.     . clonazePAM (KLONOPIN) 1 MG tablet Take 1 tablet (1 mg total) by mouth 3 (three) times daily as needed for anxiety. 90 tablet 0  . dicyclomine (BENTYL) 10 MG capsule TAKE ONE CAPSULE BY MOUTH TWICE DAILY AS NEEDED FOR  CRAMPING  AND  ABDOMINAL  PAIN 60 capsule 0  . lamoTRIgine (LAMICTAL) 25 MG tablet Wk 1: one pill by mouth at night every day Wk 2: Take one pill by mouth twice daily Wk 3: Take two pills by mouth every night and 1 pill every morning  Wk 4: Take two pills by mouth twice daily Wk 5: Take three pills every night and two pills every morning  Wk 6: three pills twice daily Wk 7: four pills every night and take three pills every morning  Wk 8: four pills twice daily Stop for rash. 240 tablet 6  . meloxicam (MOBIC) 15 MG tablet Take 1 tablet (15 mg total) by mouth daily. Take with food for low back pain 30 tablet 3  . metFORMIN (GLUCOPHAGE) 500 MG tablet Take 1 tablet (500 mg total) by mouth daily with breakfast. 90 tablet 1  . omeprazole (PRILOSEC) 20 MG capsule Take 20 mg by mouth 2 (two) times daily at 8 am and 10 pm.    . polyethylene glycol powder (GLYCOLAX/MIRALAX) powder Take 17 g by mouth daily as needed for moderate constipation. Until daily soft stools  OTC    . promethazine (PHENERGAN) 25 MG tablet Take 1 tablet (25 mg total) by mouth every 6 (six) hours as needed for nausea or vomiting. 30 tablet 0  . Respiratory Therapy  Supplies (FLUTTER) DEVI Use as directed 1 each 0  . rizatriptan (MAXALT-MLT) 10 MG disintegrating tablet Take 1 tablet (10 mg total) by mouth as needed for migraine. May repeat in 2 hours if needed. Do not take more than 3 tablets in a week. 10 tablet 3  . traZODone (DESYREL) 150 MG tablet Take 1 tablet (150 mg total) by mouth  at bedtime. 90 tablet 1  . verapamil (CALAN) 40 MG tablet TAKE ONE TABLET BY MOUTH DAILY 30 tablet 5   No current facility-administered medications on file prior to visit.    BP 144/80 mmHg  Pulse 86  Temp(Src) 97.9 F (36.6 C) (Oral)  Ht 5\' 2"  (1.575 m)  Wt 167 lb 12.8 oz (76.114 kg)  BMI 30.68 kg/m2  SpO2 96%  LMP 02/12/2014    Objective:   Physical Exam  Constitutional: She appears well-nourished.  Eyes: EOM are normal. Pupils are equal, round, and reactive to light.  Cardiovascular: Normal rate and regular rhythm.   Pulmonary/Chest: Effort normal and breath sounds normal.  Neurological: No cranial nerve deficit.  Skin: Skin is warm and dry.          Assessment & Plan:

## 2016-05-29 NOTE — Assessment & Plan Note (Signed)
Migraine for the past 2 days, no improvement on home medications. She is compliant to daily preventative meds. Neuro exam unremarkable. IM toradol and phenergan provided today. She denies recent use of NSAIDS. Discussed to notify her neurologist of this migraine and to follow up soon if possible. She is to call the office if no improvement in 24 hours. She has a driver present today.

## 2016-05-29 NOTE — Addendum Note (Signed)
Addended by: Jacqualin Combes on: 05/29/2016 12:09 PM   Modules accepted: Orders

## 2016-06-01 ENCOUNTER — Other Ambulatory Visit: Payer: Self-pay

## 2016-06-01 MED ORDER — CLONAZEPAM 1 MG PO TABS: 1.0000 mg | ORAL_TABLET | Freq: Three times a day (TID) | ORAL | Status: DC | PRN

## 2016-06-01 MED ORDER — PROMETHAZINE HCL 25 MG PO TABS: 25.0000 mg | ORAL_TABLET | Freq: Four times a day (QID) | ORAL | Status: DC | PRN

## 2016-06-01 MED ORDER — VERAPAMIL HCL 40 MG PO TABS: 40.0000 mg | ORAL_TABLET | Freq: Every day | ORAL | Status: DC

## 2016-06-01 NOTE — Telephone Encounter (Signed)
Pt left v/m requesting refill clonazepam (last refilled # 90 on 03/26/16), promethazine (last refilled # 30 on 01/03/16) and verapamil (last refilled on 11/07/15 # 30 x 5) refill on verapamil done per protocol to [facility] [place] [facility]. Pt last seen 05/02/16. Dr Deborra Medina out of office.

## 2016-06-01 NOTE — Telephone Encounter (Signed)
Approved:okay clonazepam #90 x 0 and phenergan #30 x 0

## 2016-06-01 NOTE — Telephone Encounter (Signed)
Both meds called in on voice mail at pharmacy

## 2016-06-02 ENCOUNTER — Emergency Department (HOSPITAL_COMMUNITY): Payer: BLUE CROSS/BLUE SHIELD

## 2016-06-02 ENCOUNTER — Encounter (HOSPITAL_COMMUNITY): Payer: Self-pay | Admitting: *Deleted

## 2016-06-02 ENCOUNTER — Emergency Department (HOSPITAL_COMMUNITY)
Admission: EM | Admit: 2016-06-02 | Discharge: 2016-06-02 | Disposition: A | Discharge status: Home / Self Care | Payer: BLUE CROSS/BLUE SHIELD | Attending: Emergency Medicine | Admitting: Emergency Medicine

## 2016-06-02 DIAGNOSIS — G43109 Migraine with aura, not intractable, without status migrainosus: Secondary | ICD-10-CM | POA: Diagnosis not present

## 2016-06-02 DIAGNOSIS — F17210 Nicotine dependence, cigarettes, uncomplicated: Secondary | ICD-10-CM | POA: Diagnosis not present

## 2016-06-02 DIAGNOSIS — M6289 Other specified disorders of muscle: Secondary | ICD-10-CM

## 2016-06-02 DIAGNOSIS — E119 Type 2 diabetes mellitus without complications: Secondary | ICD-10-CM | POA: Insufficient documentation

## 2016-06-02 DIAGNOSIS — R791 Abnormal coagulation profile: Secondary | ICD-10-CM | POA: Diagnosis not present

## 2016-06-02 DIAGNOSIS — Z79899 Other long term (current) drug therapy: Secondary | ICD-10-CM | POA: Insufficient documentation

## 2016-06-02 DIAGNOSIS — Z7984 Long term (current) use of oral hypoglycemic drugs: Secondary | ICD-10-CM | POA: Diagnosis not present

## 2016-06-02 DIAGNOSIS — R51 Headache: Secondary | ICD-10-CM | POA: Diagnosis present

## 2016-06-02 LAB — PROTIME-INR
INR: 1.01 (ref 0.00–1.49)
Prothrombin Time: 13.5 seconds (ref 11.6–15.2)

## 2016-06-02 LAB — I-STAT CHEM 8, ED
BUN: 12 mg/dL (ref 6–20)
CALCIUM ION: 1.08 mmol/L — AB (ref 1.12–1.23)
CREATININE: 0.8 mg/dL (ref 0.44–1.00)
Chloride: 103 mmol/L (ref 101–111)
Glucose, Bld: 111 mg/dL — ABNORMAL HIGH (ref 65–99)
HCT: 44 % (ref 36.0–46.0)
Hemoglobin: 15 g/dL (ref 12.0–15.0)
Potassium: 3.7 mmol/L (ref 3.5–5.1)
Sodium: 140 mmol/L (ref 135–145)
TCO2: 25 mmol/L (ref 0–100)

## 2016-06-02 LAB — COMPREHENSIVE METABOLIC PANEL
ALBUMIN: 3.9 g/dL (ref 3.5–5.0)
ALT: 21 U/L (ref 14–54)
AST: 19 U/L (ref 15–41)
Alkaline Phosphatase: 113 U/L (ref 38–126)
Anion gap: 8 (ref 5–15)
BILIRUBIN TOTAL: 0.5 mg/dL (ref 0.3–1.2)
BUN: 10 mg/dL (ref 6–20)
CO2: 25 mmol/L (ref 22–32)
CREATININE: 0.86 mg/dL (ref 0.44–1.00)
Calcium: 9.3 mg/dL (ref 8.9–10.3)
Chloride: 105 mmol/L (ref 101–111)
GFR calc Af Amer: >60 mL/min (ref >60)
GFR calc non Af Amer: >60 mL/min (ref >60)
GLUCOSE: 115 mg/dL — AB (ref 65–99)
POTASSIUM: 3.7 mmol/L (ref 3.5–5.1)
Sodium: 138 mmol/L (ref 135–145)
TOTAL PROTEIN: 7.5 g/dL (ref 6.5–8.1)

## 2016-06-02 LAB — CBC
HEMATOCRIT: 40.3 % (ref 36.0–46.0)
HEMOGLOBIN: 13.8 g/dL (ref 12.0–15.0)
MCH: 31.4 pg (ref 26.0–34.0)
MCHC: 34.2 g/dL (ref 30.0–36.0)
MCV: 91.8 fL (ref 78.0–100.0)
Platelets: 414 10*3/uL — ABNORMAL HIGH (ref 150–400)
RBC: 4.39 MIL/uL (ref 3.87–5.11)
RDW: 14.8 % (ref 11.5–15.5)
WBC: 11.8 10*3/uL — AB (ref 4.0–10.5)

## 2016-06-02 LAB — APTT: APTT: 28 s (ref 24–37)

## 2016-06-02 LAB — DIFFERENTIAL
BASOS ABS: 0.2 10*3/uL — AB (ref 0.0–0.1)
Basophils Relative: 2 %
EOS ABS: 0.3 10*3/uL (ref 0.0–0.7)
Eosinophils Relative: 2 %
LYMPHS ABS: 3.4 10*3/uL (ref 0.7–4.0)
LYMPHS PCT: 29 %
MONOS PCT: 8 %
Monocytes Absolute: 1 10*3/uL (ref 0.1–1.0)
NEUTROS ABS: 7 10*3/uL (ref 1.7–7.7)
NEUTROS PCT: 59 %

## 2016-06-02 LAB — I-STAT TROPONIN, ED: Troponin i, poc: 0 ng/mL (ref 0.00–0.08)

## 2016-06-02 MED ORDER — DIPHENHYDRAMINE HCL 50 MG/ML IJ SOLN
25.0000 mg | Freq: Once | INTRAMUSCULAR | Status: AC
  Administered 2016-06-02: 25 mg via INTRAVENOUS
  Filled 2016-06-02: qty 1

## 2016-06-02 MED ORDER — KETOROLAC TROMETHAMINE 30 MG/ML IJ SOLN
30.0000 mg | Freq: Once | INTRAMUSCULAR | Status: AC
  Administered 2016-06-02: 30 mg via INTRAVENOUS
  Filled 2016-06-02: qty 1

## 2016-06-02 MED ORDER — METOCLOPRAMIDE HCL 5 MG/ML IJ SOLN
10.0000 mg | Freq: Once | INTRAMUSCULAR | Status: AC
  Administered 2016-06-02: 10 mg via INTRAVENOUS
  Filled 2016-06-02: qty 2

## 2016-06-02 NOTE — ED Notes (Signed)
Pt verbalized understanding of d/c instructions and understanding of risk of not having MRI done. Pt stable, ambulatory and NAD.

## 2016-06-02 NOTE — ED Notes (Signed)
Pt called out and stated that she was ready to go home and does not want MRI, Dr Wyvonnia Dusky notified and to d/c patient.

## 2016-06-02 NOTE — ED Notes (Addendum)
Pt states that this episode is similar to last migraine episode but headache is not so bad. States that she gets right side weakness(leg and arm) with right side numbness

## 2016-06-02 NOTE — Progress Notes (Signed)
Code stroke called at 1755, patient arrived to [facility] ED via private vehicle at [phone].  LSN 1530, AS per patient has blurry vision, right side weakness and right side tingling with a headache 4/10.  Patient states she has history of migraine and this similar to prior migraines.  Was here in ed about a month ago.  NIHSS 3 cancelled at 1821

## 2016-06-02 NOTE — Discharge Instructions (Signed)
Migraine Headache You declined MRI today. Stroke appears unlikely but cannot be completely ruled out. Followup with your neurologist. Return to the ED if you develop new or worsening symptoms. A migraine headache is an intense, throbbing pain on one or both sides of your head. A migraine can last for 30 minutes to several hours. CAUSES  The exact cause of a migraine headache is not always known. However, a migraine may be caused when nerves in the brain become irritated and release chemicals that cause inflammation. This causes pain. Certain things may also trigger migraines, such as:  Alcohol.  Smoking.  Stress.  Menstruation.  Aged cheeses.  Foods or drinks that contain nitrates, glutamate, aspartame, or tyramine.  Lack of sleep.  Chocolate.  Caffeine.  Hunger.  Physical exertion.  Fatigue.  Medicines used to treat chest pain (nitroglycerine), birth control pills, estrogen, and some blood pressure medicines. SIGNS AND SYMPTOMS  Pain on one or both sides of your head.  Pulsating or throbbing pain.  Severe pain that prevents daily activities.  Pain that is aggravated by any physical activity.  Nausea, vomiting, or both.  Dizziness.  Pain with exposure to bright lights, loud noises, or activity.  General sensitivity to bright lights, loud noises, or smells. Before you get a migraine, you may get warning signs that a migraine is coming (aura). An aura may include:  Seeing flashing lights.  Seeing bright spots, halos, or zigzag lines.  Having tunnel vision or blurred vision.  Having feelings of numbness or tingling.  Having trouble talking.  Having muscle weakness. DIAGNOSIS  A migraine headache is often diagnosed based on:  Symptoms.  Physical exam.  A CT scan or MRI of your head. These imaging tests cannot diagnose migraines, but they can help rule out other causes of headaches. TREATMENT Medicines may be given for pain and nausea. Medicines can  also be given to help prevent recurrent migraines.  HOME CARE INSTRUCTIONS  Only take over-the-counter or prescription medicines for pain or discomfort as directed by your health care provider. The use of long-term narcotics is not recommended.  Lie down in a dark, quiet room when you have a migraine.  Keep a journal to find out what may trigger your migraine headaches. For example, write down:  What you eat and drink.  How much sleep you get.  Any change to your diet or medicines.  Limit alcohol consumption.  Quit smoking if you smoke.  Get 7-9 hours of sleep, or as recommended by your health care provider.  Limit stress.  Keep lights dim if bright lights bother you and make your migraines worse. SEEK IMMEDIATE MEDICAL CARE IF:   Your migraine becomes severe.  You have a fever.  You have a stiff neck.  You have vision loss.  You have muscular weakness or loss of muscle control.  You start losing your balance or have trouble walking.  You feel faint or pass out.  You have severe symptoms that are different from your first symptoms. MAKE SURE YOU:   Understand these instructions.  Will watch your condition.  Will get help right away if you are not doing well or get worse.   This information is not intended to replace advice given to you by your health care provider. Make sure you discuss any questions you have with your health care provider.   Document Released: 12/10/2005 Document Revised: 12/31/2014 Document Reviewed: 08/17/2013 Elsevier Interactive Patient Education [employer].

## 2016-06-02 NOTE — ED Notes (Addendum)
Dr Leonel Ramsay in room with EDP, this RN and rapid response RN, pt just arrived to room from [place]

## 2016-06-02 NOTE — ED Notes (Signed)
Pt given fluids to drink for fluid challenge.

## 2016-06-02 NOTE — ED Notes (Signed)
Pt ambulated independently down the hall and back into the room.

## 2016-06-02 NOTE — ED Provider Notes (Signed)
CSN: [id number]     Arrival date & time 06/02/16  1722 History   First MD Initiated Contact with Patient 06/02/16 1816     Chief Complaint  Patient presents with  . Code Stroke  . Numbness     (Consider location/radiation/quality/duration/timing/severity/associated sxs/prior Treatment) HPI Comments: Patient presents with acute onset of headache and right-sided weakness and numbness. She was called a code stroke in triage. Complains of gradual onset headache around 3:30 PM with numbness and weakness on the right side of her body. Endorses blurred vision. History of complicated migraines similar episode about one month ago with negative MRI. Denies any photophobia or phonophobia. Denies any nausea or vomiting. Denies any fever. No chest pain or shortness of breath. She is on amitriptyline as well as Lamictal at home.  The history is provided by the patient.    Past Medical History  Diagnosis Date  . Anxiety   . Tobacco abuse   . Cataract     bilateral  . SVD (spontaneous vaginal delivery)     x 3  . Bronchitis     uses inhaler if needed for bronchitis, lasted used -2014  . GERD (gastroesophageal reflux disease)   . History of blood transfusion 09-19-1966    at birth in [place], [place], unsure number of units  . Diabetes mellitus without complication ([place])     taken off Metformin since 04/2014, HgbA1C - normal, will follow up with PCP- Dr. Deborra Medina, 07/2014  . H/O exercise stress test 2011    done at [facility]- told that it was WNL, done due to pt. having panic attacks   . Chronic headaches     migraines - in past, uses Phenergan for nausea   . Poor dentition     very poor oral health    Past Surgical History  Procedure Laterality Date  . Appendectomy  1988  . Tubal ligation    . Esophagogastroduodenoscopy  12/28/2013  . Knee arthroscopy  1995    left  . Colonoscopy  02/16/2014    normal   . Laparoscopic assisted vaginal hysterectomy N/A 04/15/2014    Procedure: LAPAROSCOPIC  ASSISTED VAGINAL HYSTERECTOMY;  Surgeon: Marylynn Pearson, MD;  Location: [facility];  Service: Gynecology;  Laterality: N/A;  . Laparoscopic bilateral salpingo oopherectomy Bilateral 04/15/2014    Procedure: LAPAROSCOPIC BILATERAL SALPINGO OOPHORECTOMY;  Surgeon: Marylynn Pearson, MD;  Location: [facility];  Service: Gynecology;  Laterality: Bilateral;  . Abdominal hysterectomy    . Cholecystectomy N/A 06/07/2014    Procedure: LAPAROSCOPIC CHOLECYSTECTOMY;  Surgeon: Ralene Ok, MD;  Location: [facility];  Service: General;  Laterality: N/A;  . Cholecystectomy  June 07 2014   Family History  Problem Relation Age of Onset  . Diabetes Mother   . Hyperlipidemia Mother   . Hypertension Mother   . Colon cancer Neg Hx   . Rectal cancer Neg Hx   . Stomach cancer Neg Hx   . Migraines Neg Hx   . Dementia Neg Hx   . Lung cancer Maternal Grandfather     smoked  . Emphysema Maternal Grandfather     smoked  . COPD Maternal Grandmother     never smoked   . Prostate cancer Father   . Thyroid cancer Paternal Aunt    Social History  Substance Use Topics  . Smoking status: Heavy Tobacco Smoker -- 1.00 packs/day for 35 years    Types: Cigarettes, E-cigarettes    Last Attempt to Quit: 02/02/2014  . Smokeless tobacco: Never Used  .  Alcohol Use: No   OB History    No data available     Review of Systems  Constitutional: Negative for fever, activity change, appetite change and fatigue.  Eyes: Positive for visual disturbance.  Respiratory: Negative for cough, chest tightness and shortness of breath.   Cardiovascular: Negative for chest pain and palpitations.  Gastrointestinal: Negative for nausea, vomiting and abdominal pain.  Genitourinary: Negative for dysuria, hematuria, vaginal bleeding and vaginal discharge.  Musculoskeletal: Negative for myalgias and arthralgias.  Skin: Negative for rash.  Neurological: Positive for dizziness, weakness, light-headedness, numbness and headaches.  A complete 10  system review of systems was obtained and all systems are negative except as noted in the HPI and PMH.      Allergies  Penicillins and Topamax  Home Medications   Prior to Admission medications   Medication Sig Start Date End Date Taking? Authorizing Provider  amitriptyline (ELAVIL) 50 MG tablet Take 1-1/2 tablet at dinner time Patient taking differently: Take 75 mg by mouth at bedtime.  03/26/16  Yes Lucille Passy, MD  chlorpheniramine (CHLOR-TRIMETON) 4 MG tablet Take 4 mg by mouth every 6 (six) hours as needed for allergies.    Yes Historical Provider, MD  clonazePAM (KLONOPIN) 1 MG tablet Take 1 tablet (1 mg total) by mouth 3 (three) times daily as needed for anxiety. 06/01/16  Yes Venia Carbon, MD  dicyclomine (BENTYL) 10 MG capsule TAKE ONE CAPSULE BY MOUTH TWICE DAILY AS NEEDED FOR  CRAMPING  AND  ABDOMINAL  PAIN 01/16/16  Yes Lucille Passy, MD  lamoTRIgine (LAMICTAL) 25 MG tablet Wk 1: one pill by mouth at night every day Wk 2: Take one pill by mouth twice daily Wk 3: Take two pills by mouth every night and 1 pill every morning  Wk 4: Take two pills by mouth twice daily Wk 5: Take three pills every night and two pills every morning  Wk 6: three pills twice daily Wk 7: four pills every night and take three pills every morning  Wk 8: four pills twice daily Stop for rash. Patient taking differently: Take 50 mg by mouth 2 (two) times daily. Wk 1: one pill by mouth at night every day Wk 2: Take one pill by mouth twice daily Wk 3: Take two pills by mouth every night and 1 pill every morning  Wk 4: Take two pills by mouth twice daily Wk 5: Take three pills every night and two pills every morning  Wk 6: three pills twice daily Wk 7: four pills every night and take three pills every morning  Wk 8: four pills twice daily Stop for rash. 05/10/16  Yes Melvenia Beam, MD  meloxicam (MOBIC) 15 MG tablet Take 1 tablet (15 mg total) by mouth daily. Take with food for low back pain Patient  taking differently: Take 15 mg by mouth daily as needed for pain. Take with food for low back pain 04/02/16  Yes Owens Loffler, MD  metFORMIN (GLUCOPHAGE) 500 MG tablet Take 1 tablet (500 mg total) by mouth daily with breakfast. 01/03/16  Yes Lucille Passy, MD  omeprazole (PRILOSEC) 20 MG capsule Take 20 mg by mouth 2 (two) times daily at 8 am and 10 pm.   Yes Historical Provider, MD  polyethylene glycol powder (GLYCOLAX/MIRALAX) powder Take 17 g by mouth daily as needed for moderate constipation. Until daily soft stools  OTC 12/10/13  Yes Jennifer Piepenbrink, PA-C  promethazine (PHENERGAN) 25 MG tablet Take 1 tablet (  25 mg total) by mouth every 6 (six) hours as needed for nausea or vomiting. 06/01/16  Yes Venia Carbon, MD  Respiratory Therapy Supplies (FLUTTER) DEVI Use as directed 12/21/15  Yes Tanda Rockers, MD  rizatriptan (MAXALT-MLT) 10 MG disintegrating tablet Take 1 tablet (10 mg total) by mouth as needed for migraine. May repeat in 2 hours if needed. Do not take more than 3 tablets in a week. 03/14/16  Yes Pleas Koch, NP  traZODone (DESYREL) 150 MG tablet Take 1 tablet (150 mg total) by mouth at bedtime. 01/31/16  Yes Lucille Passy, MD  verapamil (CALAN) 40 MG tablet Take 1 tablet (40 mg total) by mouth daily. 06/01/16  Yes Lucille Passy, MD   BP 106/67 mmHg  Pulse 64  Temp(Src) 98 F (36.7 C) (Oral)  Resp 16  Ht 5\' 2"  (1.575 m)  Wt 172 lb 3 oz (78.104 kg)  BMI 31.49 kg/m2  SpO2 95%  LMP 02/12/2014 Physical Exam  Constitutional: She is oriented to person, place, and time. She appears well-developed and well-nourished. No distress.  HENT:  Head: Normocephalic and atraumatic.  Mouth/Throat: Oropharynx is clear and moist. No oropharyngeal exudate.  Eyes: Conjunctivae and EOM are normal. Pupils are equal, round, and reactive to light.  Neck: Normal range of motion. Neck supple.  No meningismus.  Cardiovascular: Normal rate, regular rhythm, normal heart sounds and intact distal  pulses.   No murmur heard. Pulmonary/Chest: Effort normal and breath sounds normal. No respiratory distress. She exhibits no tenderness.  Abdominal: Soft. There is no tenderness. There is no rebound and no guarding.  Musculoskeletal: Normal range of motion. She exhibits no edema or tenderness.  Neurological: She is alert and oriented to person, place, and time. No cranial nerve deficit. She exhibits normal muscle tone. Coordination normal.  Cranial nerves II-12 intact. 4/5 strength of right arm and right leg. Give way weakness on the right. 5/5 strength in the left. Tongue is midline.  Skin: Skin is warm.  Psychiatric: She has a normal mood and affect. Her behavior is normal.  Nursing note and vitals reviewed.   ED Course  Procedures (including critical care time) Labs Review Labs Reviewed  CBC - Abnormal; Notable for the following:    WBC 11.8 (*)    Platelets 414 (*)    All other components within normal limits  DIFFERENTIAL - Abnormal; Notable for the following:    Basophils Absolute 0.2 (*)    All other components within normal limits  COMPREHENSIVE METABOLIC PANEL - Abnormal; Notable for the following:    Glucose, Bld 115 (*)    All other components within normal limits  I-STAT CHEM 8, ED - Abnormal; Notable for the following:    Glucose, Bld 111 (*)    Calcium, Ion 1.08 (*)    All other components within normal limits  PROTIME-INR  APTT  I-STAT TROPOININ, ED  CBG MONITORING, ED    Imaging Review Ct Head Wo Contrast  06/02/2016  CLINICAL DATA:  Headache with right-sided numbness and weakness since 1530 hours today. EXAM: CT HEAD WITHOUT CONTRAST TECHNIQUE: Contiguous axial images were obtained from the base of the skull through the vertex without intravenous contrast. COMPARISON:  Head CT dated 04/27/2016 and brain MR dated 04/27/2016. FINDINGS: Normal appearing cerebral hemispheres and posterior fossa structures. Normal size and position of the ventricles. No intracranial  hemorrhage, mass lesion or CT evidence of acute infarction. Unremarkable bones and included paranasal sinuses. IMPRESSION: Normal examination. These results  were called by telephone at the time of interpretation on 06/02/2016 at 6:13 pm to Dr. Leonel Ramsay, who verbally acknowledged these results. Electronically Signed   By: Claudie Revering M.D.   On: 06/02/2016 18:14   I have personally reviewed and evaluated these images and lab results as part of my medical decision-making.   EKG Interpretation   Date/Time:  Saturday June 02 2016 17:49:14 EDT Ventricular Rate:  83 PR Interval:  154 QRS Duration: 68 QT Interval:  370 QTC Calculation: 434 R Axis:   32 Text Interpretation:  Normal sinus rhythm Possible Left atrial enlargement  Borderline ECG No significant change was found Confirmed by Wyvonnia Dusky  MD,  Kohen Reither [phone]) on 06/02/2016 6:21:02 PM      MDM   Final diagnoses:  Complicated migraine  Code stroke on arrival. History of complicated migraine. Give way right-sided weakness on exam. CT head is negative. Seen with Dr. Leonel Ramsay of neurology.  He agrees this is not an acute stroke and recommended treatment for complicated migraine.  Patient feels improved after migraine cocktail. Right-sided weakness has improved. Headache has resolved after Toradol, Reglan and Benadryl.  Per Dr. Leonel Ramsay, MRI is not necessary. Suspect complicated migraine. Follow up with outpatient neurology.  He feels back to baseline. She is ambulatory. She states headache has almost resolved. Her right-sided weakness has improved. She does not want to stay for MRI. She wishes to follow-up with her neurologist. Return precautions discussed.  Ezequiel Essex, MD 06/03/16 [phone]

## 2016-06-02 NOTE — ED Notes (Signed)
Pt reports feeling fine then onset at 3:30 of headache and numbness/weakness to right side of body. Also reports blurred vision to both eyes. Grip is weaker on right, arm drift and leg drift noted. No facial droop noted.

## 2016-06-03 ENCOUNTER — Telehealth: Payer: Self-pay | Admitting: Neurology

## 2016-06-03 ENCOUNTER — Other Ambulatory Visit: Payer: Self-pay | Admitting: Neurology

## 2016-06-03 MED ORDER — DIVALPROEX SODIUM ER 500 MG PO TB24: 500.0000 mg | ORAL_TABLET | Freq: Every day | ORAL | Status: DC

## 2016-06-03 NOTE — Consult Note (Signed)
Neurology Consultation Reason for Consult: Right-sided weakness Referring Physician: Rancour, S  CC: Right-sided weakness  History is obtained from: Patient  HPI: Ashley [name] is a 50 y.o. female with a history of previous, could've migraine causing right-sided weakness who presents with right-sided weakness. She states that around 3:30 she began having blurry vision, right-sided weakness, right-sided tingling. She describes a tingling starting in her arm and migrating down towards her leg. She states that a similar event happened about a month ago and she was diagnosed with complicated migraine. She does currently have a photophobic headache, but the headache is relatively mild   LKW: 3:30 PM tpa given?: no, not a stroke   ROS: A 14 point ROS was performed and is negative except as noted in the HPI.   Past Medical History  Diagnosis Date  . Anxiety   . Tobacco abuse   . Cataract     bilateral  . SVD (spontaneous vaginal delivery)     x 3  . Bronchitis     uses inhaler if needed for bronchitis, lasted used -2014  . GERD (gastroesophageal reflux disease)   . History of blood transfusion 02/05/66    at birth in [place], [place], unsure number of units  . Diabetes mellitus without complication ([place])     taken off Metformin since 04/2014, HgbA1C - normal, will follow up with PCP- Dr. Deborra Medina, 07/2014  . H/O exercise stress test 2011    done at [facility]- told that it was WNL, done due to pt. having panic attacks   . Chronic headaches     migraines - in past, uses Phenergan for nausea   . Poor dentition     very poor oral health      Family History  Problem Relation Age of Onset  . Diabetes Mother   . Hyperlipidemia Mother   . Hypertension Mother   . Colon cancer Neg Hx   . Rectal cancer Neg Hx   . Stomach cancer Neg Hx   . Migraines Neg Hx   . Dementia Neg Hx   . Lung cancer Maternal Grandfather     smoked  . Emphysema Maternal Grandfather     smoked  . COPD Maternal  Grandmother     never smoked   . Prostate cancer Father   . Thyroid cancer Paternal Aunt      Social History:  reports that she has been smoking Cigarettes and E-cigarettes.  She has a 35 pack-year smoking history. She has never used smokeless tobacco. She reports that she does not drink alcohol or use illicit drugs.   Exam: Current vital signs: BP 106/67 mmHg  Pulse 64  Temp(Src) 98 F (36.7 C) (Oral)  Resp 16  Ht 5\' 2"  (1.575 m)  Wt 78.104 kg (172 lb 3 oz)  BMI 31.49 kg/m2  SpO2 95%  LMP 02/12/2014 Vital signs in last 24 hours: Temp:  [98 F (36.7 C)-98.1 F (36.7 C)] 98 F (36.7 C) (06/10 2300) Pulse Rate:  [64-87] 64 (06/10 2300) Resp:  [16-18] 16 (06/10 2300) BP: (104-135)/(61-81) 106/67 mmHg (06/10 2300) SpO2:  [91 %-99 %] 95 % (06/10 2300) Weight:  [78.104 kg (172 lb 3 oz)] 78.104 kg (172 lb 3 oz) (06/10 1813)   Physical Exam  Constitutional: Appears well-developed and well-nourished.  Psych: Affect appropriate to situation Eyes: No scleral injection HENT: No OP obstrucion Head: Normocephalic.  Cardiovascular: Normal rate and regular rhythm.  Respiratory: Effort normal and breath sounds normal to anterior  ascultation GI: Soft.  No distension. There is no tenderness.  Skin: WDI  Neuro: Mental Status: Patient is awake, alert, oriented to person, place, month, year, and situation. Patient is able to give a clear and coherent history. No signs of aphasia or neglect Cranial Nerves: II: Visual Fields are full. Pupils are equal, round, and reactive to light.   III,IV, VI: EOMI without ptosis or diploplia.  V: Facial sensation is Symmetric VII: Facial movement is symmetric VIII: hearing is intact to voice X: Uvula elevates symmetrically XI: Shoulder shrug is symmetric. XII: tongue is midline without atrophy or fasciculations.  Motor: Tone is normal. Bulk is normal. 5/5 strength was present on the left, on the right she has give way weakness. She is unable to  hold her arm without drift internal she is having her arm held up while being distracted with ophthalmological exam and then when her arm is released she holds up without drift in the same position from which she was unable to hold it earlier. Once the ophthalmological exams done her arm quickly falls to the bed. Sensory: Decreased throughout the right side Cerebellar: No clear ataxia on the left, nor on the right though does not perform well because she keeps dropping her arm to the bed.    I have reviewed labs in epic and the results pertinent to this consultation are: CMP-Mark  I have reviewed the images obtained: CT head-unremarkable  Impression: 50 year old female presenting with right-sided weakness and tingling in the setting of photophobic headache. The strength exam is very much convincing for psychogenic etiology, but the description of spreading tingling does make me think that she may have complicated migraine with some superimposed embellishment.  I think that ischemic stroke is very unlikely.  Recommendations: 1) would treat as complex migraine, if she does not improve then could consider MRI brain 2) if she improves, or if her MRI is negative for stroke then no further workup needs to be done.   Roland Rack, MD [facility] [phone]  If 7pm- 7am, please page neurology on call as listed in [place].

## 2016-06-03 NOTE — Telephone Encounter (Signed)
I was informed by the ED that patient was in the ED again recently for migraines. She has not called our office at all since being seen earlier last month. I called her mobile/home number today to see how she was and there was no answer today. I will reach out to her next week and see if she would like to come in and discuss what is going on with her migraines. Botox is pending approval. We can discuss changing preventative medication since she does not seem to be improving, possibly can try Depakote or other preventative next as migraine prevention to see if this helps better.Will cc her pcp Dr. Deborra Medina as fyi, thanks

## 2016-06-03 NOTE — Telephone Encounter (Signed)
I spoke to patient. She is having daily headaches. Will start Depakote. Stop Lamictal. Will follow up this week on botox progress.   Danielle, can yo let me know if we can schedule patient for botox? Thanks!

## 2016-06-04 NOTE — Telephone Encounter (Signed)
Danielle, can you give me and patient an update on where we are with botox? Terrence Dupont, fyi) Thanks!

## 2016-06-05 NOTE — Telephone Encounter (Signed)
Called patient and scheduled her for injection. I also gave paperwork to [facility] for her medication and PA's.

## 2016-06-13 NOTE — Telephone Encounter (Signed)
Pt requesting refill on promethazine; spoke with pharmacist at [facility] and promethazine rx on hold; will get ready for pick up; pt voiced understanding.

## 2016-06-27 ENCOUNTER — Telehealth: Payer: Self-pay | Admitting: Neurology

## 2016-06-27 NOTE — Telephone Encounter (Signed)
Pt called stating the pharmacy who handles her botox would not accept her consent because a PA needs to be done first. Hinton Dyer C. Took call .

## 2016-06-27 NOTE — Telephone Encounter (Signed)
Hinton Dyer- here is the phone note if you need to document what you spoke to pt about, thank you!

## 2016-06-28 ENCOUNTER — Telehealth: Payer: Self-pay | Admitting: Neurology

## 2016-06-28 ENCOUNTER — Ambulatory Visit: Payer: Self-pay | Admitting: Neurology

## 2016-06-28 NOTE — Telephone Encounter (Signed)
Patient's Botox Will be delivered 07/03/2016.

## 2016-06-28 NOTE — Telephone Encounter (Signed)
Approval number for Botox 06/12/2016-12/09/2016 [id number] . Per [facility] patient needs to call back and give consent . Patient did call once before to give consent but [facility] did not save.. Patient needs to call [phone]. Left message on her voice mail.

## 2016-07-02 NOTE — Telephone Encounter (Signed)
Pt has called [facility] and has paid for the medication. Pt needs to set up appt.

## 2016-07-03 ENCOUNTER — Encounter: Payer: Self-pay | Admitting: Family Medicine

## 2016-07-03 ENCOUNTER — Ambulatory Visit (INDEPENDENT_AMBULATORY_CARE_PROVIDER_SITE_OTHER): Payer: BLUE CROSS/BLUE SHIELD | Admitting: Family Medicine

## 2016-07-03 VITALS — BP 142/72 | HR 96 | Temp 98.2°F | Wt 175.8 lb

## 2016-07-03 DIAGNOSIS — G43119 Migraine with aura, intractable, without status migrainosus: Secondary | ICD-10-CM | POA: Diagnosis not present

## 2016-07-03 DIAGNOSIS — G43009 Migraine without aura, not intractable, without status migrainosus: Secondary | ICD-10-CM

## 2016-07-03 MED ORDER — PROMETHAZINE HCL 25 MG/ML IJ SOLN
25.0000 mg | Freq: Once | INTRAMUSCULAR | Status: AC
  Administered 2016-07-03: 25 mg via INTRAMUSCULAR

## 2016-07-03 MED ORDER — KETOROLAC TROMETHAMINE 60 MG/2ML IM SOLN
60.0000 mg | Freq: Once | INTRAMUSCULAR | Status: AC
  Administered 2016-07-03: 60 mg via INTRAMUSCULAR

## 2016-07-03 NOTE — Progress Notes (Signed)
Pre visit review using our clinic review tool, if applicable. No additional management support is needed unless otherwise documented below in the visit note.  Migraine.  Today is the second day.  Light and sound bother her.  Nauseated.  Has a driver here today.  Took maxalt twice yesterday and again once this AM.  Frequently having HAs in the last few months.  Still on regular meds.    Seeing neurology with plan for botox injection in the near future.  She has had HA this bad (and worse) prev.  Had been given toradol and phenergan prev at ER and here at clinic, with some effect.  No focal neuro deficits.    Meds, vitals, and allergies reviewed.   ROS: Per HPI unless specifically indicated in ROS section   nad but uncomfortable, in dark room, wearing sunglasses.  GEN: nad, alert and oriented HEENT: mucous membranes moist NECK: supple w/o LA CV: rrr.   PULM: ctab, no inc wob EXT: no edema CN 2-12 wnl B, S/S wnl grossly

## 2016-07-03 NOTE — Patient Instructions (Signed)
Get home (don't drive) and get some rest.  Take care.  Glad to see you.

## 2016-07-04 NOTE — Telephone Encounter (Signed)
Pt calling to make botox appt. Please call

## 2016-07-04 NOTE — Assessment & Plan Note (Signed)
Dosed with IM toradol and phenergan here in clinic.  Has a driver. Will go home, rest, continue baseline meds and fu with neuro.  No focal neuro changes.  Okay for outpatient f/u.  Routed to PCP as FYI.

## 2016-07-06 NOTE — Telephone Encounter (Signed)
Ashley [name] this patient's medication is here would you mind calling her and adding her to Dr. Cathren Laine scheduled. She does botox on Thursday mornings! Thank you!

## 2016-07-09 ENCOUNTER — Ambulatory Visit: Payer: BLUE CROSS/BLUE SHIELD | Admitting: Neurology

## 2016-07-09 NOTE — Telephone Encounter (Signed)
I called the pt, she will out of town next week. Appt has been scheduled for 7/31/@ 8AM

## 2016-07-10 NOTE — Telephone Encounter (Signed)
Patient is on Dr. Cathren Laine schedule for Botox 07/23/2016.

## 2016-07-23 ENCOUNTER — Ambulatory Visit (INDEPENDENT_AMBULATORY_CARE_PROVIDER_SITE_OTHER): Payer: BLUE CROSS/BLUE SHIELD | Admitting: Neurology

## 2016-07-23 VITALS — BP 162/84 | HR 72 | Ht 62.0 in

## 2016-07-23 DIAGNOSIS — G43711 Chronic migraine without aura, intractable, with status migrainosus: Secondary | ICD-10-CM | POA: Diagnosis not present

## 2016-07-23 MED ORDER — TOPIRAMATE ER 25 MG PO CAP24: 50.0000 mg | ORAL_CAPSULE | Freq: Every day | ORAL | 11 refills | Status: DC

## 2016-07-23 NOTE — Progress Notes (Signed)

## 2016-07-23 NOTE — Patient Instructions (Addendum)
Remember to drink plenty of fluid, eat healthy meals and do not skip any meals. Try to eat protein with a every meal and eat a healthy snack such as fruit or nuts in between meals. Try to keep a regular sleep-wake schedule and try to exercise daily, particularly in the form of walking, 20-30 minutes a day, if you can.   As far as your medications are concerned, I would like to suggest: Qudexy 25mg  take 2 pills before bedtime (50mg ) cann in 6 weeks and let us know if you would like to continue  I would like to see you back in 3 months for botox, sooner if we need to. Please call us with any interim questions, concerns, problems, updates or refill requests.   Our phone number is [phone]. We also have an after hours call service for urgent matters and there is a physician on-call for urgent questions. For any emergencies you know to call 911 or go to the nearest emergency room  Topiramate extended-release capsules What is this medicine? TOPIRAMATE (toe PYRE a mate) is used to treat seizures in adults or children with epilepsy. This medicine may be used for other purposes; ask your health care provider or pharmacist if you have questions. What should I tell my health care provider before I take this medicine? They need to know if you have any of these conditions: -cirrhosis of the liver or liver disease -diarrhea -glaucoma -kidney stones or kidney disease -lung disease like asthma, obstructive pulmonary disease, emphysema -metabolic acidosis -on a ketogenic diet -scheduled for surgery or a procedure -suicidal thoughts, plans, or attempt; a previous suicide attempt by you or a family member -an unusual or allergic reaction to topiramate, other medicines, foods, dyes, or preservatives -pregnant or trying to get pregnant -breast-feeding How should I use this medicine? Take this medicine by mouth with a glass of water. Follow the directions on the prescription label. Trokendi XR capsules must  be swallowed whole. Do not sprinkle on food, break, crush, dissolve, or chew. Qudexy XR capsules may be swallowed whole or opened and sprinkled on a small amount of soft food. This mixture must be swallowed immediately. Do not chew or store mixture for later use. You may take this medicine with meals. Take your medicine at regular intervals. Do not take it more often than directed. Talk to your pediatrician regarding the use of this medicine in children. Special care may be needed. While Trokendi XR may be prescribed for children as young as 6 years and Qudexy XR may be prescribed for children as young as 2 years for selected conditions, precautions do apply. Overdosage: If you think you have taken too much of this medicine contact a poison control center or emergency room at once. NOTE: This medicine is only for you. Do not share this medicine with others. What if I miss a dose? If you miss a dose, take it as soon as you can. If it is almost time for your next dose, take only that dose. Do not take double or extra doses. What may interact with this medicine? Do not take this medicine with any of the following medications: -probenecid This medicine may also interact with the following medications: -acetazolamide -alcohol -amitriptyline -birth control pills -digoxin -hydrochlorothiazide -lithium -medicines for pain, sleep, or muscle relaxation -metformin -methazolamide -other seizure or epilepsy medicines -pioglitazone -risperidone This list may not describe all possible interactions. Give your health care provider a list of all the medicines, herbs, non-prescription drugs, or dietary  supplements you use. Also tell them if you smoke, drink alcohol, or use illegal drugs. Some items may interact with your medicine. What should I watch for while using this medicine? Visit your doctor or health care professional for regular checks on your progress. Do not stop taking this medicine suddenly. This  increases the risk of seizures if you are using this medicine to control epilepsy. Wear a medical identification bracelet or chain to say you have epilepsy or seizures, and carry a card that lists all your medicines. This medicine can decrease sweating and increase your body temperature. Watch for signs of deceased sweating or fever, especially in children. Avoid extreme heat, hot baths, and saunas. Be careful about exercising, especially in hot weather. Contact your health care provider right away if you notice a fever or decrease in sweating. You should drink plenty of fluids while taking this medicine. If you have had kidney stones in the past, this will help to reduce your chances of forming kidney stones. If you have stomach pain, with nausea or vomiting and yellowing of your eyes or skin, call your doctor immediately. You may get drowsy, dizzy, or have blurred vision. Do not drive, use machinery, or do anything that needs mental alertness until you know how this medicine affects you. To reduce dizziness, do not sit or stand up quickly, especially if you are an older patient. Alcohol can increase drowsiness and dizziness. Avoid alcoholic drinks. Do not drink alcohol for 6 hours before or 6 hours after taking Trokendi XR. If you notice blurred vision, eye pain, or other eye problems, seek medical attention at once for an eye exam. The use of this medicine may increase the chance of suicidal thoughts or actions. Pay special attention to how you are responding while on this medicine. Any worsening of mood, or thoughts of suicide or dying should be reported to your health care professional right away. This medicine may increase the chance of developing metabolic acidosis. If left untreated, this can cause kidney stones, bone disease, or slowed growth in children. Symptoms include breathing fast, fatigue, loss of appetite, irregular heartbeat, or loss of consciousness. Call your doctor immediately if you  experience any of these side effects. Also, tell your doctor about any surgery you plan on having while taking this medicine since this may increase your risk for metabolic acidosis. Birth control pills may not work properly while you are taking this medicine. Talk to your doctor about using an extra method of birth control. Women who become pregnant while using this medicine may enroll in the [place] Pregnancy Registry by calling [phone]. This registry collects information about the safety of antiepileptic drug use during pregnancy. What side effects may I notice from receiving this medicine? Side effects that you should report to your doctor or health care professional as soon as possible: -allergic reactions like skin rash, itching or hives, swelling of the face, lips, or tongue -decreased sweating and/or rise in body temperature -depression -difficulty breathing, fast or irregular breathing patterns -difficulty speaking -difficulty walking or controlling muscle movements -hearing impairment -redness, blistering, peeling or loosening of the skin, including inside the mouth -tingling, pain or numbness in the hands or feet -unusually weak or tired -worsening of mood, thoughts or actions of suicide or dying Side effects that usually do not require medical attention (Report these to your doctor or health care professional if they continue or are bothersome.): -altered taste -back pain, joint or muscle aches and pains -diarrhea,  or constipation -headache -loss of appetite -nausea -stomach upset, indigestion -tremors This list may not describe all possible side effects. Call your doctor for medical advice about side effects. You may report side effects to FDA at 1-800-FDA-1088. Where should I keep my medicine? Keep out of the reach of children. Store at room temperature between 15 and 30 degrees C (59 and 86 degrees F) in a tightly closed container. Protect  from moisture. Throw away any unused medicine after the expiration date. NOTE: This sheet is a summary. It may not cover all possible information. If you have questions about this medicine, talk to your doctor, pharmacist, or health care provider.    2016, [employer]vier/[employer] Standard. (2013-03-10 15:33:26)

## 2016-07-25 ENCOUNTER — Encounter: Payer: Self-pay | Admitting: Family Medicine

## 2016-07-25 ENCOUNTER — Ambulatory Visit (INDEPENDENT_AMBULATORY_CARE_PROVIDER_SITE_OTHER): Payer: BLUE CROSS/BLUE SHIELD | Admitting: Family Medicine

## 2016-07-25 DIAGNOSIS — G44011 Episodic cluster headache, intractable: Secondary | ICD-10-CM | POA: Diagnosis not present

## 2016-07-25 DIAGNOSIS — R51 Headache: Secondary | ICD-10-CM | POA: Diagnosis not present

## 2016-07-25 DIAGNOSIS — R600 Localized edema: Secondary | ICD-10-CM

## 2016-07-25 DIAGNOSIS — R519 Headache, unspecified: Secondary | ICD-10-CM

## 2016-07-25 LAB — CBC WITH DIFFERENTIAL/PLATELET
BASOS ABS: 0 10*3/uL (ref 0.0–0.1)
Basophils Relative: 0.6 % (ref 0.0–3.0)
EOS ABS: 0.2 10*3/uL (ref 0.0–0.7)
Eosinophils Relative: 2.7 % (ref 0.0–5.0)
HEMATOCRIT: 37.8 % (ref 36.0–46.0)
Hemoglobin: 12.8 g/dL (ref 12.0–15.0)
LYMPHS ABS: 2.9 10*3/uL (ref 0.7–4.0)
LYMPHS PCT: 40.3 % (ref 12.0–46.0)
MCHC: 33.8 g/dL (ref 30.0–36.0)
MCV: 92.9 fl (ref 78.0–100.0)
MONO ABS: 0.5 10*3/uL (ref 0.1–1.0)
Monocytes Relative: 7.4 % (ref 3.0–12.0)
NEUTROS ABS: 3.5 10*3/uL (ref 1.4–7.7)
NEUTROS PCT: 49 % (ref 43.0–77.0)
PLATELETS: 398 10*3/uL (ref 150.0–400.0)
RBC: 4.06 Mil/uL (ref 3.87–5.11)
RDW: 14.4 % (ref 11.5–15.5)
WBC: 7.1 10*3/uL (ref 4.0–10.5)

## 2016-07-25 LAB — COMPREHENSIVE METABOLIC PANEL
ALK PHOS: 83 U/L (ref 39–117)
ALT: 15 U/L (ref 0–35)
AST: 15 U/L (ref 0–37)
Albumin: 3.9 g/dL (ref 3.5–5.2)
BILIRUBIN TOTAL: 0.3 mg/dL (ref 0.2–1.2)
BUN: 13 mg/dL (ref 6–23)
CO2: 25 mEq/L (ref 19–32)
CREATININE: 0.87 mg/dL (ref 0.40–1.20)
Calcium: 9 mg/dL (ref 8.4–10.5)
Chloride: 106 mEq/L (ref 96–112)
GFR: 73.1 mL/min (ref >60.00)
GLUCOSE: 129 mg/dL — AB (ref 70–99)
Potassium: 3.6 mEq/L (ref 3.5–5.1)
Sodium: 140 mEq/L (ref 135–145)
TOTAL PROTEIN: 6.9 g/dL (ref 6.0–8.3)

## 2016-07-25 LAB — BRAIN NATRIURETIC PEPTIDE: PRO B NATRI PEPTIDE: 12 pg/mL (ref 0.0–100.0)

## 2016-07-25 LAB — TSH: TSH: 1.69 u[IU]/mL (ref 0.35–4.50)

## 2016-07-25 MED ORDER — CLONAZEPAM 1 MG PO TABS
1.0000 mg | ORAL_TABLET | Freq: Three times a day (TID) | ORAL | 0 refills | Status: DC | PRN
Start: 2016-07-25 — End: 2016-09-03

## 2016-07-25 MED ORDER — AMITRIPTYLINE HCL 50 MG PO TABS: ORAL_TABLET | ORAL | 3 refills | Status: DC

## 2016-07-25 MED ORDER — TRAZODONE HCL 150 MG PO TABS: 150.0000 mg | ORAL_TABLET | Freq: Every day | ORAL | 1 refills | Status: DC

## 2016-07-25 MED ORDER — DICYCLOMINE HCL 10 MG PO CAPS: ORAL_CAPSULE | ORAL | 0 refills | Status: DC

## 2016-07-25 NOTE — Addendum Note (Signed)
Addended by: Marchia Bond on: 07/25/2016 02:53 PM   Modules accepted: Orders

## 2016-07-25 NOTE — Progress Notes (Signed)
Pre visit review using our clinic review tool, if applicable. No additional management support is needed unless otherwise documented below in the visit note. 

## 2016-07-25 NOTE — Progress Notes (Signed)
Subjective:   Patient ID: Ashley [name], female    DOB: 04-21-1966, 50 y.o.   MRN: [id number]  Ashley [name] is a pleasant 50 y.o. year old female who presents to clinic today with Foot Swelling  on 07/25/2016  HPI:  Acute onset of bilateral leg edema and "all over swelling."  Was [place] the beach last Tuesday and started to notice that both of her feet were swollen.  Not tender or red but swelling is uncomfortable. Swelling goes down a little by the end of the day. No CP but she has noticed a little more DOE.  Has gained weight over the past week as well.  Wt Readings from Last 3 Encounters:  07/25/16 181 lb 8 oz (82.3 kg)  07/03/16 175 lb 12 oz (79.7 kg)  06/02/16 172 lb 3 oz (78.1 kg)   Lab Results  Component Value Date   TSH 1.870 05/10/2016   Lab Results  Component Value Date   WBC 11.8 (H) 06/02/2016   HGB 13.8 06/02/2016   HGB 15.0 06/02/2016   HCT 40.3 06/02/2016   HCT 44.0 06/02/2016   MCV 91.8 06/02/2016   PLT 414 (H) 06/02/2016   Lab Results  Component Value Date   NA 138 06/02/2016   NA 140 06/02/2016   K 3.7 06/02/2016   K 3.7 06/02/2016   CL 105 06/02/2016   CL 103 06/02/2016   CO2 25 06/02/2016     Current Outpatient Prescriptions on File Prior to Visit  Medication Sig Dispense Refill  . amitriptyline (ELAVIL) 50 MG tablet Take 1-1/2 tablet at dinner time (Patient taking differently: Take 75 mg by mouth at bedtime. ) 45 tablet 3  . chlorpheniramine (CHLOR-TRIMETON) 4 MG tablet Take 4 mg by mouth every 6 (six) hours as needed for allergies.     . clonazePAM (KLONOPIN) 1 MG tablet Take 1 tablet (1 mg total) by mouth 3 (three) times daily as needed for anxiety. 90 tablet 0  . dicyclomine (BENTYL) 10 MG capsule TAKE ONE CAPSULE BY MOUTH TWICE DAILY AS NEEDED FOR  CRAMPING  AND  ABDOMINAL  PAIN 60 capsule 0  . meloxicam (MOBIC) 15 MG tablet Take 1 tablet (15 mg total) by mouth daily. Take with food for low back pain (Patient taking differently: Take 15 mg by  mouth daily as needed for pain. Take with food for low back pain) 30 tablet 3  . metFORMIN (GLUCOPHAGE) 500 MG tablet Take 1 tablet (500 mg total) by mouth daily with breakfast. 90 tablet 1  . omeprazole (PRILOSEC) 20 MG capsule Take 20 mg by mouth 2 (two) times daily at 8 am and 10 pm.    . polyethylene glycol powder (GLYCOLAX/MIRALAX) powder Take 17 g by mouth daily as needed for moderate constipation. Until daily soft stools  OTC    . promethazine (PHENERGAN) 25 MG tablet Take 1 tablet (25 mg total) by mouth every 6 (six) hours as needed for nausea or vomiting. 30 tablet 0  . Respiratory Therapy Supplies (FLUTTER) DEVI Use as directed 1 each 0  . rizatriptan (MAXALT-MLT) 10 MG disintegrating tablet Take 1 tablet (10 mg total) by mouth as needed for migraine. May repeat in 2 hours if needed. Do not take more than 3 tablets in a week. 10 tablet 3  . Topiramate ER 25 MG CP24 Take 50 mg by mouth at bedtime. 60 capsule 11  . traZODone (DESYREL) 150 MG tablet Take 1 tablet (150 mg total) by mouth at  bedtime. 90 tablet 1  . verapamil (CALAN) 40 MG tablet Take 1 tablet (40 mg total) by mouth daily. 30 tablet 5   No current facility-administered medications on file prior to visit.     Allergies  Allergen Reactions  . Penicillins Anaphylaxis    Throat swells, Has patient had a PCN reaction causing immediate rash, facial/tongue/throat swelling, SOB or lightheadedness with hypotension: Yes Has patient had a PCN reaction causing severe rash involving mucus membranes or skin necrosis: No Has patient had a PCN reaction that required hospitalization No Has patient had a PCN reaction occurring within the last 10 years: No If all of the above answers are "NO", then may proceed with Cephalosporin use.   . Topamax [Topiramate] Other (See Comments)    Hands and feet to go numb    Past Medical History:  Diagnosis Date  . Anxiety   . Bronchitis    uses inhaler if needed for bronchitis, lasted used -2014   . Cataract    bilateral  . Chronic headaches    migraines - in past, uses Phenergan for nausea   . Diabetes mellitus without complication ([place])    taken off Metformin since 04/2014, HgbA1C - normal, will follow up with PCP- Dr. Deborra Medina, 07/2014  . GERD (gastroesophageal reflux disease)   . H/O exercise stress test 2011   done at [facility]- told that it was WNL, done due to pt. having panic attacks   . History of blood transfusion 11-26-1966   at birth in [place], [place], unsure number of units  . Poor dentition    very poor oral health   . SVD (spontaneous vaginal delivery)    x 3  . Tobacco abuse     Past Surgical History:  Procedure Laterality Date  . ABDOMINAL HYSTERECTOMY    . APPENDECTOMY  1988  . CHOLECYSTECTOMY N/A 06/07/2014   Procedure: LAPAROSCOPIC CHOLECYSTECTOMY;  Surgeon: Ralene Ok, MD;  Location: [facility];  Service: General;  Laterality: N/A;  . CHOLECYSTECTOMY  June 07 2014  . COLONOSCOPY  02/16/2014   normal   . ESOPHAGOGASTRODUODENOSCOPY  12/28/2013  . KNEE ARTHROSCOPY  1995   left  . LAPAROSCOPIC ASSISTED VAGINAL HYSTERECTOMY N/A 04/15/2014   Procedure: LAPAROSCOPIC ASSISTED VAGINAL HYSTERECTOMY;  Surgeon: Marylynn Pearson, MD;  Location: [facility];  Service: Gynecology;  Laterality: N/A;  . LAPAROSCOPIC BILATERAL SALPINGO OOPHERECTOMY Bilateral 04/15/2014   Procedure: LAPAROSCOPIC BILATERAL SALPINGO OOPHORECTOMY;  Surgeon: Marylynn Pearson, MD;  Location: [facility];  Service: Gynecology;  Laterality: Bilateral;  . TUBAL LIGATION      Family History  Problem Relation Age of Onset  . Diabetes Mother   . Hyperlipidemia Mother   . Hypertension Mother   . Colon cancer Neg Hx   . Rectal cancer Neg Hx   . Stomach cancer Neg Hx   . Migraines Neg Hx   . Dementia Neg Hx   . Lung cancer Maternal Grandfather     smoked  . Emphysema Maternal Grandfather     smoked  . COPD Maternal Grandmother     never smoked   . Prostate cancer Father   . Thyroid cancer Paternal Aunt      Social History   Social History  . Marital status: Married    Spouse name: Ashley [name]  . Number of children: 6  . Years of education: 69   Occupational History  .       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  Smoking status: Current Every Day Smoker    Packs/day: 1.00    Years: 35.00    Types: Cigarettes, E-cigarettes    Last attempt to quit: 02/02/2014  . Smokeless tobacco: Never Used  . Alcohol use No  . Drug use: No  . Sexual activity: Yes    Birth control/ protection: Pill   Other Topics Concern  . Not on file   Social History Narrative   5 living children, one child is a crack cocaine addict who often breaks into their house to steal money. One child died of cerebral palsy.   Caffeine use: Dr Malachi Bonds (3 per day)   2 cups coffee per day   The PMH, PSH, Social History, Family History, Medications, and allergies have been reviewed in [facility], and have been updated if relevant.   Review of Systems  Constitutional: Positive for unexpected weight change.  HENT: Negative.   Respiratory: Positive for shortness of breath.   Cardiovascular: Positive for leg swelling. Negative for chest pain and palpitations.  Gastrointestinal: Negative.   Musculoskeletal: Negative.   Neurological: Negative.   Hematological: Negative.   Psychiatric/Behavioral: Negative.   All other systems reviewed and are negative.      Objective:    BP 124/70   Pulse 77   Temp 98 F (36.7 C) (Oral)   Wt 181 lb 8 oz (82.3 kg)   LMP 02/12/2014   SpO2 98%   BMI 33.20 kg/m    Physical Exam  Constitutional: She is oriented to person, place, and time. She appears well-developed and well-nourished. No distress.  HENT:  Head: Normocephalic.  Eyes: Conjunctivae are normal.  Cardiovascular: Normal rate and regular rhythm.   Pulmonary/Chest: Effort normal and breath sounds normal.  Musculoskeletal: She exhibits edema.  Bilateral non pitting LE edema  Neurological: She is alert  and oriented to person, place, and time. No cranial nerve deficit.  Skin: Skin is warm and dry. She is not diaphoretic.  Psychiatric: She has a normal mood and affect. Her behavior is normal. Judgment and thought content normal.  Nursing note and vitals reviewed.         Assessment & Plan:   Bilateral leg edema No Follow-up on file.

## 2016-07-25 NOTE — Assessment & Plan Note (Signed)
New- acute onset with DOE and weight gain. Heart and lung exam reassuring.  No pain or erythema of legs with exam and edema is non pitting. Unclear etiology at this point- start work up with labs. The patient indicates understanding of these issues and agrees with the plan. Orders Placed This Encounter  Procedures  . Brain natriuretic peptide  . Comprehensive metabolic panel  . TSH  . CBC With Differential  . D-dimer, quantitative (not at [facility])

## 2016-07-26 ENCOUNTER — Ambulatory Visit (INDEPENDENT_AMBULATORY_CARE_PROVIDER_SITE_OTHER)
Admission: RE | Admit: 2016-07-26 | Discharge: 2016-07-26 | Disposition: A | Discharge status: Home / Self Care | Payer: BLUE CROSS/BLUE SHIELD | Source: Ambulatory Visit | Attending: Family Medicine | Admitting: Family Medicine

## 2016-07-26 ENCOUNTER — Other Ambulatory Visit: Payer: Self-pay | Admitting: Family Medicine

## 2016-07-26 DIAGNOSIS — R600 Localized edema: Secondary | ICD-10-CM

## 2016-07-26 DIAGNOSIS — R7989 Other specified abnormal findings of blood chemistry: Secondary | ICD-10-CM

## 2016-07-26 DIAGNOSIS — R791 Abnormal coagulation profile: Secondary | ICD-10-CM

## 2016-07-26 DIAGNOSIS — R0600 Dyspnea, unspecified: Secondary | ICD-10-CM

## 2016-07-26 LAB — D-DIMER, QUANTITATIVE (NOT AT ARMC): D DIMER QUANT: 0.68 ug{FEU}/mL — AB (ref <0.50)

## 2016-07-26 MED ORDER — IOPAMIDOL (ISOVUE-370) INJECTION 76%
80.0000 mL | Freq: Once | INTRAVENOUS | Status: AC | PRN
  Administered 2016-07-26: 80 mL via INTRAVENOUS

## 2016-08-09 ENCOUNTER — Encounter: Payer: Self-pay | Admitting: Cardiology

## 2016-08-10 ENCOUNTER — Encounter (INDEPENDENT_AMBULATORY_CARE_PROVIDER_SITE_OTHER): Payer: Self-pay

## 2016-08-10 ENCOUNTER — Ambulatory Visit (INDEPENDENT_AMBULATORY_CARE_PROVIDER_SITE_OTHER): Payer: BLUE CROSS/BLUE SHIELD | Admitting: Internal Medicine

## 2016-08-10 ENCOUNTER — Encounter: Payer: Self-pay | Admitting: Cardiology

## 2016-08-10 ENCOUNTER — Encounter: Payer: Self-pay | Admitting: *Deleted

## 2016-08-10 VITALS — BP 130/76 | HR 70 | Ht 62.0 in | Wt 181.4 lb

## 2016-08-10 DIAGNOSIS — R0600 Dyspnea, unspecified: Secondary | ICD-10-CM | POA: Diagnosis not present

## 2016-08-10 MED ORDER — LISINOPRIL 5 MG PO TABS: 5.0000 mg | ORAL_TABLET | Freq: Every day | ORAL | 6 refills | Status: DC

## 2016-08-10 MED ORDER — FUROSEMIDE 40 MG PO TABS: 40.0000 mg | ORAL_TABLET | Freq: Every day | ORAL | 6 refills | Status: DC

## 2016-08-10 NOTE — Patient Instructions (Addendum)
Medication Instructions:    STOP TAKING VERAPAMIL  1. START TAKING ASPIRIN 81 MG ONCE A DAY   2. START TAKING LISINOPRIL 5 MG ONCE A DAY   3.START TAKING LASIX 40 MG ONCE A DAY    If you need a refill on your cardiac medications before your next appointment, please call your pharmacy.   Labwork: RETURN  ON Wednesday 08/15/16  FOR BMET    Testing/Procedures: Your physician has requested that you have an echocardiogram. Echocardiography is a painless test that uses sound waves to create images of your heart. It provides your doctor with information about the size and shape of your heart and how well your heart's chambers and valves are working. This procedure takes approximately one hour. There are no restrictions for this procedure.                          (SEE LETTER FOR CARDIAC CATHETERIZATION PROCEDURE ON 08/17/2016.)  Your physician has requested that you have a cardiac catheterization. Cardiac catheterization is used to diagnose and/or treat various heart conditions. Doctors may recommend this procedure for a number of different reasons. The most common reason is to evaluate chest pain. Chest pain can be a symptom of coronary artery disease (CAD), and cardiac catheterization can show whether plaque is narrowing or blocking your heart's arteries. This procedure is also used to evaluate the valves, as well as measure the blood flow and oxygen levels in different parts of your heart. For further information please visit [email or web address]. Please follow instruction sheet, as given.     Follow-Up: WILL BE DETERMINED BASED UPON CATHERIZATION ON 08/17/2016   Any Other Special Instructions Will Be Listed Below (If Applicable).

## 2016-08-10 NOTE — Progress Notes (Signed)
08/10/2016 Ashley [name]   07-23-1966  [id number]  Primary Physician Arnette Norris, MD Primary Cardiologist: New (Seen by Dr. Caryl Comes today)   Reason for Visit/CC: New Patient Evaluation for Bilateral LEE, Exertional Dyspnea + Chest pain  HPI:  The patient is a 50 year old female smoker who has been referred to our practice by her PCP, Dr. Bea Graff or cardiac evaluation given new onset exertional chest pain, dyspnea and lower extremity edema.   She has been a smoker since the age of 73. She smokes on average 1-1.5 packs per day. She has a family history of CAD however only in 3 out of 4 grandparents. No cardiac history in any first-degree relatives. In addition to tobacco use, she has a history of diabetes, treated with metformin. She is not on an ACE-I. She is on verapamil but she reports that this is not for blood pressure. This was prescribed for migraine headaches. Her blood pressure in clinic today is currently 130/76 without any other medications. She has never been given a formal diagnosis of hypertension however she reports that the other day she checked her blood pressure at home and it was 189/70. She has no known history of hyperlipidemia.  She reports that over the past 3 weeks, she has had unexplained bilateral lower extremity edema, new onset exertional dyspnea as well as exertional chest discomfort. She denies any resting chest pain or dyspnea. Her chest discomfort is described as substernal pressure/tightness radiating to her back. Symptoms are relieved with rest. She is currently chest pain-free. EKG shows normal sinus rhythm without objective evidence of ischemia. Heart rate is 71 bpm.  She has also had issues of unexplained weight gain of nearly 50 pounds over the past 3 months. Also notes increased abdominal girth. She sleeps with 3 pillows and has been doing this for the past year. No PND.  Her PCP recently conducted a workup which included a normal BNP. D-dimer was abnormal however  chest CT was negative for pulmonary emboli. There is also no infiltrate or masses. No aortic pathology. No coronary artery calcifications.   Current Outpatient Prescriptions  Medication Sig Dispense Refill  . amitriptyline (ELAVIL) 50 MG tablet Take 1-1/2 tablet at dinner time 45 tablet 3  . aspirin 81 MG chewable tablet Chew 81 mg by mouth daily.    . chlorpheniramine (CHLOR-TRIMETON) 4 MG tablet Take 4 mg by mouth every 6 (six) hours as needed for allergies.     . clonazePAM (KLONOPIN) 1 MG tablet Take 1 tablet (1 mg total) by mouth 3 (three) times daily as needed for anxiety. 90 tablet 0  . dicyclomine (BENTYL) 10 MG capsule TAKE ONE CAPSULE BY MOUTH TWICE DAILY AS NEEDED FOR  CRAMPING  AND  ABDOMINAL  PAIN 60 capsule 0  . meloxicam (MOBIC) 15 MG tablet Take 15 mg by mouth daily.    . metFORMIN (GLUCOPHAGE) 500 MG tablet Take 1 tablet (500 mg total) by mouth daily with breakfast. 90 tablet 1  . omeprazole (PRILOSEC) 20 MG capsule Take 20 mg by mouth 2 (two) times daily at 8 am and 10 pm.    . polyethylene glycol powder (GLYCOLAX/MIRALAX) powder Take 17 g by mouth daily as needed for moderate constipation. Until daily soft stools  OTC    . promethazine (PHENERGAN) 25 MG tablet Take 1 tablet (25 mg total) by mouth every 6 (six) hours as needed for nausea or vomiting. 30 tablet 0  . Respiratory Therapy Supplies (FLUTTER) DEVI Use as directed 1 each  0  . rizatriptan (MAXALT-MLT) 10 MG disintegrating tablet Take 1 tablet (10 mg total) by mouth as needed for migraine. May repeat in 2 hours if needed. Do not take more than 3 tablets in a week. 10 tablet 3  . Topiramate ER 25 MG CP24 Take 50 mg by mouth at bedtime. 60 capsule 11  . traZODone (DESYREL) 150 MG tablet Take 1 tablet (150 mg total) by mouth at bedtime. 90 tablet 1  . furosemide (LASIX) 40 MG tablet Take 1 tablet (40 mg total) by mouth daily. 30 tablet 6  . lisinopril (PRINIVIL,ZESTRIL) 5 MG tablet Take 1 tablet (5 mg total) by mouth  daily. 60 tablet 6   No current facility-administered medications for this visit.     Allergies  Allergen Reactions  . Penicillins Anaphylaxis    Throat swells, Has patient had a PCN reaction causing immediate rash, facial/tongue/throat swelling, SOB or lightheadedness with hypotension: Yes Has patient had a PCN reaction causing severe rash involving mucus membranes or skin necrosis: No Has patient had a PCN reaction that required hospitalization No Has patient had a PCN reaction occurring within the last 10 years: No If all of the above answers are "NO", then may proceed with Cephalosporin use.   . Topamax [Topiramate] Other (See Comments)    Hands and feet to go numb    Social History   Social History  . Marital status: Married    Spouse name: Alvester Chou  . Number of children: 6  . Years of education: 72   Occupational History  .       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 Smoking status: Current Every Day Smoker    Packs/day: 1.00    Years: 35.00    Types: Cigarettes, E-cigarettes    Last attempt to quit: 02/02/2014  . Smokeless tobacco: Never Used  . Alcohol use No  . Drug use: No  . Sexual activity: Yes    Birth control/ protection: Pill   Other Topics Concern  . Not on file   Social History Narrative   5 living children, one child is a crack cocaine addict who often breaks into their house to steal money. One child died of cerebral palsy.   Caffeine use: Dr Malachi Bonds (3 per day)   2 cups coffee per day     Review of Systems: General: negative for chills, fever, night sweats or weight changes.  Cardiovascular: negative for chest pain, dyspnea on exertion, edema, orthopnea, palpitations, paroxysmal nocturnal dyspnea or shortness of breath Dermatological: negative for rash Respiratory: negative for cough or wheezing Urologic: negative for hematuria Abdominal: negative for nausea, vomiting, diarrhea, bright red blood per rectum, melena, or  hematemesis Neurologic: negative for visual changes, syncope, or dizziness All other systems reviewed and are otherwise negative except as noted above.    Blood pressure 130/76, pulse 70, height 5\' 2"  (1.575 m), weight 181 lb 6.4 oz (82.3 kg), last menstrual period 02/12/2014, SpO2 98 %.  General appearance: alert, cooperative and no distress, currently obese, dentition in very poor repair Neck: no carotid bruit and JVP8 Lungs: clear to auscultation bilaterally Heart: regular rate and rhythm, S1, S2 normal, no murmur, click, rub or gallop Extremities: Trace bilateral lower extremity edema, pedal edema bilaterally Pulses: 2+ and symmetric Skin: Warm and dry Neurologic: Grossly normal  EKG sinus rhythm. No ischemia. 71 bpm.  ASSESSMENT AND PLAN:   The patient is a 50 year old female with multiple cardiac risk factors  including long-term tobacco use, family history of CAD in distant relatives, diabetes as well as a questionable history of hypertension who presents with symptoms concerning for cardiac etiology including exertional chest pressure, exertional dyspnea and unexplained bilateral lower extremity edema. Workup conducted by her PCP was unremarkable including normal BNP and normal chest CT, negative for PE as well as no coronary artery calcifications and normal aortic anatomy. Resting EKG shows no objective signs of ischemia.  Despite this, her pretest probability is high. Given her risk factors and symptomatology, we will proceed with a 2-D echocardiogram to assess LV function, wall motion and valve anatomy. We will also schedule her for a definitive LHC to rule out underlying CAD. We will d/c her Verapamil and add a diuretic, 40 mg of Lasix daily + an ACE-I, 5 mg of lisinopril daily, for renal protection given DM. We will check a BMP in 1 week and recheck BP. The patient was also seen and examined along with Dr. Caryl Comes who agrees with the above assessment and plan.   Lyda Jester  PA-C 08/10/2016 1:27 PM   Patient seen and examined. She has  h a multitude of cardiac risk factors and a high pretest likelihood of coronary disease based on risk factors and typical history. As such, I think a negative Myoview scan before his insufficient posttest probability to be reassuring. Hence, we have discussed undertaking catheterization as well as an echocardiogram. We will begin her on aspirin. We will add low-dose ACE inhibitor for her diabetes. We will discontinue her verapamil.  Risks of catheterization were reviewed

## 2016-08-15 ENCOUNTER — Other Ambulatory Visit: Payer: BLUE CROSS/BLUE SHIELD | Admitting: *Deleted

## 2016-08-15 ENCOUNTER — Ambulatory Visit (HOSPITAL_COMMUNITY)
Admission: RE | Admit: 2016-08-15 | Discharge: 2016-08-15 | Disposition: A | Discharge status: Home / Self Care | Payer: BLUE CROSS/BLUE SHIELD | Source: Ambulatory Visit | Attending: Cardiology | Admitting: Cardiology

## 2016-08-15 DIAGNOSIS — R0600 Dyspnea, unspecified: Secondary | ICD-10-CM | POA: Diagnosis not present

## 2016-08-15 DIAGNOSIS — E119 Type 2 diabetes mellitus without complications: Secondary | ICD-10-CM | POA: Diagnosis not present

## 2016-08-15 DIAGNOSIS — Z720 Tobacco use: Secondary | ICD-10-CM | POA: Insufficient documentation

## 2016-08-15 DIAGNOSIS — E785 Hyperlipidemia, unspecified: Secondary | ICD-10-CM | POA: Diagnosis not present

## 2016-08-15 LAB — BASIC METABOLIC PANEL
BUN: 18 mg/dL (ref 7–25)
CHLORIDE: 103 mmol/L (ref 98–110)
CO2: 25 mmol/L (ref 20–31)
Calcium: 9.3 mg/dL (ref 8.6–10.4)
Creat: 1.13 mg/dL — ABNORMAL HIGH (ref 0.50–1.05)
Glucose, Bld: 129 mg/dL — ABNORMAL HIGH (ref 65–99)
POTASSIUM: 4.7 mmol/L (ref 3.5–5.3)
Sodium: 142 mmol/L (ref 135–146)

## 2016-08-15 NOTE — Progress Notes (Signed)
  Echocardiogram 2D Echocardiogram has been performed.  Jennette Dubin 08/15/2016, 10:39 AM

## 2016-08-15 NOTE — Addendum Note (Signed)
Addended by: Eulis Foster on: 08/15/2016 11:06 AM   Modules accepted: Orders

## 2016-08-17 ENCOUNTER — Ambulatory Visit (HOSPITAL_COMMUNITY)
Admission: RE | Admit: 2016-08-17 | Discharge: 2016-08-17 | Disposition: A | Discharge status: Home / Self Care | Payer: BLUE CROSS/BLUE SHIELD | Source: Ambulatory Visit | Attending: Cardiovascular Disease | Admitting: Cardiovascular Disease

## 2016-08-17 ENCOUNTER — Telehealth: Payer: Self-pay | Admitting: *Deleted

## 2016-08-17 ENCOUNTER — Encounter (HOSPITAL_COMMUNITY)
Admission: RE | Disposition: A | Discharge status: Home / Self Care | Payer: Self-pay | Source: Ambulatory Visit | Attending: Cardiovascular Disease

## 2016-08-17 DIAGNOSIS — Z7982 Long term (current) use of aspirin: Secondary | ICD-10-CM | POA: Insufficient documentation

## 2016-08-17 DIAGNOSIS — Z833 Family history of diabetes mellitus: Secondary | ICD-10-CM | POA: Diagnosis not present

## 2016-08-17 DIAGNOSIS — F17210 Nicotine dependence, cigarettes, uncomplicated: Secondary | ICD-10-CM | POA: Insufficient documentation

## 2016-08-17 DIAGNOSIS — R600 Localized edema: Secondary | ICD-10-CM | POA: Diagnosis not present

## 2016-08-17 DIAGNOSIS — R072 Precordial pain: Secondary | ICD-10-CM | POA: Diagnosis not present

## 2016-08-17 DIAGNOSIS — E119 Type 2 diabetes mellitus without complications: Secondary | ICD-10-CM | POA: Diagnosis not present

## 2016-08-17 DIAGNOSIS — Z8249 Family history of ischemic heart disease and other diseases of the circulatory system: Secondary | ICD-10-CM | POA: Insufficient documentation

## 2016-08-17 DIAGNOSIS — Z7984 Long term (current) use of oral hypoglycemic drugs: Secondary | ICD-10-CM | POA: Insufficient documentation

## 2016-08-17 HISTORY — PX: CARDIAC CATHETERIZATION: SHX172

## 2016-08-17 LAB — BASIC METABOLIC PANEL
Anion gap: 7 (ref 5–15)
BUN: 14 mg/dL (ref 6–20)
CALCIUM: 9.2 mg/dL (ref 8.9–10.3)
CO2: 24 mmol/L (ref 22–32)
CREATININE: 0.98 mg/dL (ref 0.44–1.00)
Chloride: 108 mmol/L (ref 101–111)
GFR calc non Af Amer: >60 mL/min (ref >60)
Glucose, Bld: 136 mg/dL — ABNORMAL HIGH (ref 65–99)
Potassium: 4.3 mmol/L (ref 3.5–5.1)
Sodium: 139 mmol/L (ref 135–145)

## 2016-08-17 LAB — CBC
HEMATOCRIT: 40.3 % (ref 36.0–46.0)
Hemoglobin: 13.1 g/dL (ref 12.0–15.0)
MCH: 31.1 pg (ref 26.0–34.0)
MCHC: 32.5 g/dL (ref 30.0–36.0)
MCV: 95.7 fL (ref 78.0–100.0)
PLATELETS: 319 10*3/uL (ref 150–400)
RBC: 4.21 MIL/uL (ref 3.87–5.11)
RDW: 13.7 % (ref 11.5–15.5)
WBC: 6.9 10*3/uL (ref 4.0–10.5)

## 2016-08-17 LAB — GLUCOSE, CAPILLARY: GLUCOSE-CAPILLARY: 140 mg/dL — AB (ref 65–99)

## 2016-08-17 LAB — PROTIME-INR
INR: 0.97
Prothrombin Time: 12.9 seconds (ref 11.4–15.2)

## 2016-08-17 SURGERY — LEFT HEART CATH AND CORONARY ANGIOGRAPHY
Anesthesia: LOCAL

## 2016-08-17 MED ORDER — ASPIRIN 81 MG PO CHEW
81.0000 mg | CHEWABLE_TABLET | ORAL | Status: AC
  Administered 2016-08-17: 81 mg via ORAL

## 2016-08-17 MED ORDER — HEPARIN SODIUM (PORCINE) 1000 UNIT/ML IJ SOLN
INTRAMUSCULAR | Status: DC | PRN
  Administered 2016-08-17: 4000 [IU] via INTRAVENOUS

## 2016-08-17 MED ORDER — HEPARIN (PORCINE) IN NACL 2-0.9 UNIT/ML-% IJ SOLN
INTRAMUSCULAR | Status: DC | PRN
  Administered 2016-08-17: 13:00:00

## 2016-08-17 MED ORDER — VERAPAMIL HCL 2.5 MG/ML IV SOLN
INTRAVENOUS | Status: DC | PRN
  Administered 2016-08-17: 10 mL via INTRA_ARTERIAL

## 2016-08-17 MED ORDER — VERAPAMIL HCL 2.5 MG/ML IV SOLN
INTRAVENOUS | Status: AC
  Filled 2016-08-17: qty 2

## 2016-08-17 MED ORDER — HEPARIN (PORCINE) IN NACL 2-0.9 UNIT/ML-% IJ SOLN
INTRAMUSCULAR | Status: AC
  Filled 2016-08-17: qty 1000

## 2016-08-17 MED ORDER — FENTANYL CITRATE (PF) 100 MCG/2ML IJ SOLN
INTRAMUSCULAR | Status: AC
  Filled 2016-08-17: qty 2

## 2016-08-17 MED ORDER — SODIUM CHLORIDE 0.9% FLUSH: 3.0000 mL | Freq: Two times a day (BID) | INTRAVENOUS | Status: DC

## 2016-08-17 MED ORDER — SODIUM CHLORIDE 0.9% FLUSH: 3.0000 mL | INTRAVENOUS | Status: DC | PRN

## 2016-08-17 MED ORDER — SODIUM CHLORIDE 0.9 % IV SOLN: INTRAVENOUS | Status: DC

## 2016-08-17 MED ORDER — MIDAZOLAM HCL 2 MG/2ML IJ SOLN
INTRAMUSCULAR | Status: AC
  Filled 2016-08-17: qty 2

## 2016-08-17 MED ORDER — SODIUM CHLORIDE 0.9 % IV SOLN: 250.0000 mL | INTRAVENOUS | Status: DC | PRN

## 2016-08-17 MED ORDER — FENTANYL CITRATE (PF) 100 MCG/2ML IJ SOLN
INTRAMUSCULAR | Status: DC | PRN
  Administered 2016-08-17: 25 ug via INTRAVENOUS

## 2016-08-17 MED ORDER — SODIUM CHLORIDE 0.9% FLUSH
3.0000 mL | Freq: Two times a day (BID) | INTRAVENOUS | Status: DC
Start: 2016-08-17 — End: 2016-08-17

## 2016-08-17 MED ORDER — MIDAZOLAM HCL 2 MG/2ML IJ SOLN
INTRAMUSCULAR | Status: DC | PRN
  Administered 2016-08-17: 1 mg via INTRAVENOUS

## 2016-08-17 MED ORDER — ASPIRIN 81 MG PO CHEW
CHEWABLE_TABLET | ORAL | Status: AC
  Filled 2016-08-17: qty 1

## 2016-08-17 MED ORDER — HEPARIN SODIUM (PORCINE) 1000 UNIT/ML IJ SOLN
INTRAMUSCULAR | Status: AC
  Filled 2016-08-17: qty 1

## 2016-08-17 MED ORDER — IOPAMIDOL (ISOVUE-370) INJECTION 76%
INTRAVENOUS | Status: DC | PRN
  Administered 2016-08-17: 50 mL

## 2016-08-17 MED ORDER — IOPAMIDOL (ISOVUE-370) INJECTION 76%
INTRAVENOUS | Status: AC
  Filled 2016-08-17: qty 100

## 2016-08-17 MED ORDER — SODIUM CHLORIDE 0.9 % IV SOLN: INTRAVENOUS | Status: AC

## 2016-08-17 MED ORDER — LIDOCAINE HCL (PF) 1 % IJ SOLN
INTRAMUSCULAR | Status: AC
  Filled 2016-08-17: qty 30

## 2016-08-17 SURGICAL SUPPLY — 10 items
CATH INFINITI 5 FR JL3.5 (CATHETERS) ×1 IMPLANT
CATH INFINITI 5FR MULTPACK ANG (CATHETERS) ×1 IMPLANT
DEVICE RAD COMP TR BAND LRG (VASCULAR PRODUCTS) ×1 IMPLANT
GLIDESHEATH SLEND SS 6F .021 (SHEATH) ×1 IMPLANT
KIT HEART LEFT (KITS) ×2 IMPLANT
PACK CARDIAC CATHETERIZATION (CUSTOM PROCEDURE TRAY) ×2 IMPLANT
SYR MEDRAD MARK V 150ML (SYRINGE) ×2 IMPLANT
TRANSDUCER W/STOPCOCK (MISCELLANEOUS) ×2 IMPLANT
TUBING CIL FLEX 10 FLL-RA (TUBING) ×2 IMPLANT
WIRE SAFE-T 1.5MM-J .035X260CM (WIRE) ×1 IMPLANT

## 2016-08-17 NOTE — Telephone Encounter (Signed)
Spoke with patient and provided lab and echo results. She had heart cath today. Has follow up with APP in September.  She would like to know if Dr. Caryl Comes will sign for a handicap placard since she is so SOB, especially with exertion.

## 2016-08-17 NOTE — Telephone Encounter (Signed)
Will forward to Dr. Klein to review. 

## 2016-08-17 NOTE — Discharge Instructions (Signed)
Radial Site Care °Refer to this sheet in the next few weeks. These instructions provide you with information about caring for yourself after your procedure. Your health care provider may also give you more specific instructions. Your treatment has been planned according to current medical practices, but problems sometimes occur. Call your health care provider if you have any problems or questions after your procedure. °WHAT TO EXPECT AFTER THE PROCEDURE °After your procedure, it is typical to have the following: °· Bruising at the radial site that usually fades within 1-2 weeks. °· Blood collecting in the tissue (hematoma) that may be painful to the touch. It should usually decrease in size and tenderness within 1-2 weeks. °HOME CARE INSTRUCTIONS °· Take medicines only as directed by your health care provider. °· You may shower 24-48 hours after the procedure or as directed by your health care provider. Remove the bandage (dressing) and gently wash the site with plain soap and water. Pat the area dry with a clean towel. Do not rub the site, because this may cause bleeding. °· Do not take baths, swim, or use a hot tub until your health care provider approves. °· Check your insertion site every day for redness, swelling, or drainage. °· Do not apply powder or lotion to the site. °· Do not flex or bend the affected arm for 24 hours or as directed by your health care provider. °· Do not push or pull heavy objects with the affected arm for 24 hours or as directed by your health care provider. °· Do not lift over 10 lb (4.5 kg) for 5 days after your procedure or as directed by your health care provider. °· Ask your health care provider when it is okay to: °¨ Return to work or school. °¨ Resume usual physical activities or sports. °¨ Resume sexual activity. °· Do not drive home if you are discharged the same day as the procedure. Have someone else drive you. °· You may drive 24 hours after the procedure unless otherwise  instructed by your health care provider. °· Do not operate machinery or power tools for 24 hours after the procedure. °· If your procedure was done as an outpatient procedure, which means that you went home the same day as your procedure, a responsible adult should be with you for the first 24 hours after you arrive home. °· Keep all follow-up visits as directed by your health care provider. This is important. °SEEK MEDICAL CARE IF: °· You have a fever. °· You have chills. °· You have increased bleeding from the radial site. Hold pressure on the site. °SEEK IMMEDIATE MEDICAL CARE IF: °· You have unusual pain at the radial site. °· You have redness, warmth, or swelling at the radial site. °· You have drainage (other than a small amount of blood on the dressing) from the radial site. °· The radial site is bleeding, and the bleeding does not stop after 30 minutes of holding steady pressure on the site. °· Your arm or hand becomes pale, cool, tingly, or numb. °  °This information is not intended to replace advice given to you by your health care provider. Make sure you discuss any questions you have with your health care provider. °  °Document Released: 01/12/2011 Document Revised: 12/31/2014 Document Reviewed: 06/28/2014 °Elsevier Interactive Patient Education ©2016 [employer] ° °

## 2016-08-17 NOTE — Progress Notes (Signed)
lvm 8/25 dg

## 2016-08-17 NOTE — H&P (View-Only) (Signed)
08/10/2016 Ashley [name]   March 01, 1966  [id number]  Primary Physician Arnette Norris, MD Primary Cardiologist: New (Seen by Dr. Caryl Comes today)   Reason for Visit/CC: New Patient Evaluation for Bilateral LEE, Exertional Dyspnea + Chest pain  HPI:  The patient is a 50 year old female smoker who has been referred to our practice by her PCP, Dr. Bea Graff or cardiac evaluation given new onset exertional chest pain, dyspnea and lower extremity edema.   She has been a smoker since the age of 44. She smokes on average 1-1.5 packs per day. She has a family history of CAD however only in 3 out of 4 grandparents. No cardiac history in any first-degree relatives. In addition to tobacco use, she has a history of diabetes, treated with metformin. She is not on an ACE-I. She is on verapamil but she reports that this is not for blood pressure. This was prescribed for migraine headaches. Her blood pressure in clinic today is currently 130/76 without any other medications. She has never been given a formal diagnosis of hypertension however she reports that the other day she checked her blood pressure at home and it was 189/70. She has no known history of hyperlipidemia.  She reports that over the past 3 weeks, she has had unexplained bilateral lower extremity edema, new onset exertional dyspnea as well as exertional chest discomfort. She denies any resting chest pain or dyspnea. Her chest discomfort is described as substernal pressure/tightness radiating to her back. Symptoms are relieved with rest. She is currently chest pain-free. EKG shows normal sinus rhythm without objective evidence of ischemia. Heart rate is 71 bpm.  She has also had issues of unexplained weight gain of nearly 50 pounds over the past 3 months. Also notes increased abdominal girth. She sleeps with 3 pillows and has been doing this for the past year. No PND.  Her PCP recently conducted a workup which included a normal BNP. D-dimer was abnormal however  chest CT was negative for pulmonary emboli. There is also no infiltrate or masses. No aortic pathology. No coronary artery calcifications.   Current Outpatient Prescriptions  Medication Sig Dispense Refill  . amitriptyline (ELAVIL) 50 MG tablet Take 1-1/2 tablet at dinner time 45 tablet 3  . aspirin 81 MG chewable tablet Chew 81 mg by mouth daily.    . chlorpheniramine (CHLOR-TRIMETON) 4 MG tablet Take 4 mg by mouth every 6 (six) hours as needed for allergies.     . clonazePAM (KLONOPIN) 1 MG tablet Take 1 tablet (1 mg total) by mouth 3 (three) times daily as needed for anxiety. 90 tablet 0  . dicyclomine (BENTYL) 10 MG capsule TAKE ONE CAPSULE BY MOUTH TWICE DAILY AS NEEDED FOR  CRAMPING  AND  ABDOMINAL  PAIN 60 capsule 0  . meloxicam (MOBIC) 15 MG tablet Take 15 mg by mouth daily.    . metFORMIN (GLUCOPHAGE) 500 MG tablet Take 1 tablet (500 mg total) by mouth daily with breakfast. 90 tablet 1  . omeprazole (PRILOSEC) 20 MG capsule Take 20 mg by mouth 2 (two) times daily at 8 am and 10 pm.    . polyethylene glycol powder (GLYCOLAX/MIRALAX) powder Take 17 g by mouth daily as needed for moderate constipation. Until daily soft stools  OTC    . promethazine (PHENERGAN) 25 MG tablet Take 1 tablet (25 mg total) by mouth every 6 (six) hours as needed for nausea or vomiting. 30 tablet 0  . Respiratory Therapy Supplies (FLUTTER) DEVI Use as directed 1 each  0  . rizatriptan (MAXALT-MLT) 10 MG disintegrating tablet Take 1 tablet (10 mg total) by mouth as needed for migraine. May repeat in 2 hours if needed. Do not take more than 3 tablets in a week. 10 tablet 3  . Topiramate ER 25 MG CP24 Take 50 mg by mouth at bedtime. 60 capsule 11  . traZODone (DESYREL) 150 MG tablet Take 1 tablet (150 mg total) by mouth at bedtime. 90 tablet 1  . furosemide (LASIX) 40 MG tablet Take 1 tablet (40 mg total) by mouth daily. 30 tablet 6  . lisinopril (PRINIVIL,ZESTRIL) 5 MG tablet Take 1 tablet (5 mg total) by mouth  daily. 60 tablet 6   No current facility-administered medications for this visit.     Allergies  Allergen Reactions  . Penicillins Anaphylaxis    Throat swells, Has patient had a PCN reaction causing immediate rash, facial/tongue/throat swelling, SOB or lightheadedness with hypotension: Yes Has patient had a PCN reaction causing severe rash involving mucus membranes or skin necrosis: No Has patient had a PCN reaction that required hospitalization No Has patient had a PCN reaction occurring within the last 10 years: No If all of the above answers are "NO", then may proceed with Cephalosporin use.   . Topamax [Topiramate] Other (See Comments)    Hands and feet to go numb    Social History   Social History  . Marital status: Married    Spouse name: Alvester Chou  . Number of children: 6  . Years of education: 55   Occupational History  .       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 Smoking status: Current Every Day Smoker    Packs/day: 1.00    Years: 35.00    Types: Cigarettes, E-cigarettes    Last attempt to quit: 02/02/2014  . Smokeless tobacco: Never Used  . Alcohol use No  . Drug use: No  . Sexual activity: Yes    Birth control/ protection: Pill   Other Topics Concern  . Not on file   Social History Narrative   5 living children, one child is a crack cocaine addict who often breaks into their house to steal money. One child died of cerebral palsy.   Caffeine use: Dr Malachi Bonds (3 per day)   2 cups coffee per day     Review of Systems: General: negative for chills, fever, night sweats or weight changes.  Cardiovascular: negative for chest pain, dyspnea on exertion, edema, orthopnea, palpitations, paroxysmal nocturnal dyspnea or shortness of breath Dermatological: negative for rash Respiratory: negative for cough or wheezing Urologic: negative for hematuria Abdominal: negative for nausea, vomiting, diarrhea, bright red blood per rectum, melena, or  hematemesis Neurologic: negative for visual changes, syncope, or dizziness All other systems reviewed and are otherwise negative except as noted above.    Blood pressure 130/76, pulse 70, height 5\' 2"  (1.575 m), weight 181 lb 6.4 oz (82.3 kg), last menstrual period 02/12/2014, SpO2 98 %.  General appearance: alert, cooperative and no distress, currently obese, dentition in very poor repair Neck: no carotid bruit and JVP8 Lungs: clear to auscultation bilaterally Heart: regular rate and rhythm, S1, S2 normal, no murmur, click, rub or gallop Extremities: Trace bilateral lower extremity edema, pedal edema bilaterally Pulses: 2+ and symmetric Skin: Warm and dry Neurologic: Grossly normal  EKG sinus rhythm. No ischemia. 71 bpm.  ASSESSMENT AND PLAN:   The patient is a 50 year old female with multiple cardiac risk factors  including long-term tobacco use, family history of CAD in distant relatives, diabetes as well as a questionable history of hypertension who presents with symptoms concerning for cardiac etiology including exertional chest pressure, exertional dyspnea and unexplained bilateral lower extremity edema. Workup conducted by her PCP was unremarkable including normal BNP and normal chest CT, negative for PE as well as no coronary artery calcifications and normal aortic anatomy. Resting EKG shows no objective signs of ischemia.  Despite this, her pretest probability is high. Given her risk factors and symptomatology, we will proceed with a 2-D echocardiogram to assess LV function, wall motion and valve anatomy. We will also schedule her for a definitive LHC to rule out underlying CAD. We will d/c her Verapamil and add a diuretic, 40 mg of Lasix daily + an ACE-I, 5 mg of lisinopril daily, for renal protection given DM. We will check a BMP in 1 week and recheck BP. The patient was also seen and examined along with Dr. Caryl Comes who agrees with the above assessment and plan.   Lyda Jester  PA-C 08/10/2016 1:27 PM   Patient seen and examined. She has  h a multitude of cardiac risk factors and a high pretest likelihood of coronary disease based on risk factors and typical history. As such, I think a negative Myoview scan before his insufficient posttest probability to be reassuring. Hence, we have discussed undertaking catheterization as well as an echocardiogram. We will begin her on aspirin. We will add low-dose ACE inhibitor for her diabetes. We will discontinue her verapamil.  Risks of catheterization were reviewed

## 2016-08-17 NOTE — Research (Signed)
CADLAD Informed Consent   Subject Name: Ashley [name]  Subject met inclusion and exclusion criteria.  The informed consent form, study requirements and expectations were reviewed with the subject and questions and concerns were addressed prior to the signing of the consent form.  The subject verbalized understanding of the trail requirements.  The subject agreed to participate in the CADLAD trial and signed the informed consent.  The informed consent was obtained prior to performance of any protocol-specific procedures for the subject.  A copy of the signed informed consent was given to the subject and a copy was placed in the subject's medical record.  Berneda Syble 08/17/2016, 1:12 PM

## 2016-08-17 NOTE — Interval H&P Note (Signed)
History and Physical Interval Note:  08/17/2016 11:18 AM  Ashley [name]  has presented today for cardiac cath with the diagnosis of exertional cp, dyspnea concerning for unstable angina.  The various methods of treatment have been discussed with the patient and family. After consideration of risks, benefits and other options for treatment, the patient has consented to  Procedure(s): Left Heart Cath and Coronary Angiography (N/A) as a surgical intervention .  The patient's history has been reviewed, patient examined, no change in status, stable for surgery.  I have reviewed the patient's chart and labs.  Questions were answered to the patient's satisfaction.    Cath Lab Visit (complete for each Cath Lab visit)  Clinical Evaluation Leading to the Procedure:   ACS: No.  Non-ACS:    Anginal Classification: CCS III  Anti-ischemic medical therapy: No Therapy  Non-Invasive Test Results: No non-invasive testing performed  Prior CABG: No previous CABG         Lauree Chandler

## 2016-08-20 ENCOUNTER — Encounter (HOSPITAL_COMMUNITY): Payer: Self-pay | Admitting: Cardiovascular Disease

## 2016-08-20 MED FILL — Lidocaine HCl Local Preservative Free (PF) Inj 1%: INTRAMUSCULAR | Qty: 30 | Status: AC

## 2016-08-20 NOTE — Telephone Encounter (Signed)
She should followup with PCP as does not look like this is a cardiac issue ths

## 2016-08-21 NOTE — Telephone Encounter (Signed)
The patient is aware of Dr. Olin Pia recommendations regarding her handicap placard.

## 2016-08-22 ENCOUNTER — Ambulatory Visit (INDEPENDENT_AMBULATORY_CARE_PROVIDER_SITE_OTHER): Payer: BLUE CROSS/BLUE SHIELD | Admitting: Family Medicine

## 2016-08-22 ENCOUNTER — Ambulatory Visit (INDEPENDENT_AMBULATORY_CARE_PROVIDER_SITE_OTHER)
Admission: RE | Admit: 2016-08-22 | Discharge: 2016-08-22 | Disposition: A | Discharge status: Home / Self Care | Payer: BLUE CROSS/BLUE SHIELD | Source: Ambulatory Visit | Attending: Family Medicine | Admitting: Family Medicine

## 2016-08-22 ENCOUNTER — Other Ambulatory Visit: Payer: Self-pay | Admitting: Family Medicine

## 2016-08-22 ENCOUNTER — Encounter: Payer: Self-pay | Admitting: Family Medicine

## 2016-08-22 VITALS — BP 132/62 | HR 79 | Temp 98.1°F | Wt 179.2 lb

## 2016-08-22 DIAGNOSIS — R140 Abdominal distension (gaseous): Secondary | ICD-10-CM

## 2016-08-22 DIAGNOSIS — K567 Ileus, unspecified: Secondary | ICD-10-CM | POA: Diagnosis not present

## 2016-08-22 DIAGNOSIS — Z23 Encounter for immunization: Secondary | ICD-10-CM | POA: Diagnosis not present

## 2016-08-22 DIAGNOSIS — R0600 Dyspnea, unspecified: Secondary | ICD-10-CM

## 2016-08-22 NOTE — Patient Instructions (Signed)
Good to see you. We will call you with an appointment to see a lung doctor and with your xray results today.

## 2016-08-22 NOTE — Assessment & Plan Note (Addendum)
KUB today. ? If this is contributing to her sob. May need to proceed with CT of abd/pelvis based on these results

## 2016-08-22 NOTE — Progress Notes (Signed)
Subjective:   Patient ID: Ashley [name], female    DOB: 05-25-1966, 50 y.o.   MRN: [id number]  Ashley [name] is a pleasant 50 y.o. year old female who presents to clinic today with Shortness of Breath  on 08/22/2016  HPI:  I saw her earlier this month for progressive DOE, weight gain and LE edema. BNP normal. CT neg for PE.  Referred her to cardiology.  Was seen by Dr. Caryl Comes on 08/10/16- note reviewed. Advised 2 d echo and added lasix and ACEI while d/cing her verapamil.  Echo normal, proceeded with cath- cardiac cath done on 08/15/16 was normal.  Has had neg pulmonary work up in past.    Has had more abdominal distension as well and remains SOB.  Current Outpatient Prescriptions on File Prior to Visit  Medication Sig Dispense Refill  . amitriptyline (ELAVIL) 50 MG tablet Take 1-1/2 tablet at dinner time (Patient taking differently: Take 75 mg by mouth daily after supper. ) 45 tablet 3  . aspirin EC 81 MG tablet Take 81 mg by mouth daily.    . clonazePAM (KLONOPIN) 1 MG tablet Take 1 tablet (1 mg total) by mouth 3 (three) times daily as needed for anxiety. 90 tablet 0  . dicyclomine (BENTYL) 10 MG capsule TAKE ONE CAPSULE BY MOUTH TWICE DAILY AS NEEDED FOR  CRAMPING  AND  ABDOMINAL  PAIN (Patient taking differently: Take 10 mg by mouth at bedtime. FOR  CRAMPING  AND  ABDOMINAL  PAIN) 60 capsule 0  . docusate sodium (COLACE) 100 MG capsule Take 100 mg by mouth at bedtime.    . furosemide (LASIX) 40 MG tablet Take 1 tablet (40 mg total) by mouth daily. (Patient taking differently: Take 40 mg by mouth at bedtime. ) 30 tablet 6  . lisinopril (PRINIVIL,ZESTRIL) 5 MG tablet Take 1 tablet (5 mg total) by mouth daily. (Patient taking differently: Take 5 mg by mouth at bedtime. ) 60 tablet 6  . meloxicam (MOBIC) 15 MG tablet Take 15 mg by mouth daily.    Marland Kitchen omeprazole (PRILOSEC) 20 MG capsule Take 20 mg by mouth 2 (two) times daily.     . promethazine (PHENERGAN) 25 MG tablet Take 1 tablet (25 mg  total) by mouth every 6 (six) hours as needed for nausea or vomiting. (Patient taking differently: Take 25 mg by mouth 2 (two) times daily as needed (nausea from migraines). ) 30 tablet 0  . rizatriptan (MAXALT-MLT) 10 MG disintegrating tablet Take 1 tablet (10 mg total) by mouth as needed for migraine. May repeat in 2 hours if needed. Do not take more than 3 tablets in a week. (Patient taking differently: Take 10 mg by mouth See admin instructions. Take 1 tablet (10 mg) by mouth at onset of migraine headache, may repeat in 2 hours if needed. Do not take more than 3 tablets in a week.) 10 tablet 3  . Topiramate ER 25 MG CP24 Take 50 mg by mouth at bedtime. (Patient taking differently: Take 50 mg by mouth at bedtime. For migraines) 60 capsule 11  . traZODone (DESYREL) 150 MG tablet Take 1 tablet (150 mg total) by mouth at bedtime. 90 tablet 1   No current facility-administered medications on file prior to visit.     Allergies  Allergen Reactions  . Penicillins Anaphylaxis    Throat swells, Has patient had a PCN reaction causing immediate rash, facial/tongue/throat swelling, SOB or lightheadedness with hypotension: Yes Has patient had a PCN reaction causing  severe rash involving mucus membranes or skin necrosis: No Has patient had a PCN reaction that required hospitalization No Has patient had a PCN reaction occurring within the last 10 years: No If all of the above answers are "NO", then may proceed with Cephalosporin use.   . Topamax [Topiramate] Other (See Comments)    Hands and feet to go numb    Past Medical History:  Diagnosis Date  . Anxiety   . Bronchitis    uses inhaler if needed for bronchitis, lasted used -2014  . Cataract    bilateral  . Chronic headaches    migraines - in past, uses Phenergan for nausea   . Diabetes mellitus without complication ([place])    taken off Metformin since 04/2014, HgbA1C - normal, will follow up with PCP- Dr. Deborra Medina, 07/2014  . GERD (gastroesophageal  reflux disease)   . H/O exercise stress test 2011   done at [facility]- told that it was WNL, done due to pt. having panic attacks   . History of blood transfusion 1966-01-17   at birth in [place], [place], unsure number of units  . Poor dentition    very poor oral health   . SVD (spontaneous vaginal delivery)    x 3  . Tobacco abuse     Past Surgical History:  Procedure Laterality Date  . ABDOMINAL HYSTERECTOMY    . APPENDECTOMY  1988  . CARDIAC CATHETERIZATION N/A 08/17/2016   Procedure: Left Heart Cath and Coronary Angiography;  Surgeon: Burnell Blanks, MD;  Location: [place] [facility];  Service: Cardiovascular;  Laterality: N/A;  . CHOLECYSTECTOMY N/A 06/07/2014   Procedure: LAPAROSCOPIC CHOLECYSTECTOMY;  Surgeon: Ralene Ok, MD;  Location: [place];  Service: General;  Laterality: N/A;  . CHOLECYSTECTOMY  June 07 2014  . COLONOSCOPY  02/16/2014   normal   . ESOPHAGOGASTRODUODENOSCOPY  12/28/2013  . KNEE ARTHROSCOPY  1995   left  . LAPAROSCOPIC ASSISTED VAGINAL HYSTERECTOMY N/A 04/15/2014   Procedure: LAPAROSCOPIC ASSISTED VAGINAL HYSTERECTOMY;  Surgeon: Marylynn Pearson, MD;  Location: [facility];  Service: Gynecology;  Laterality: N/A;  . LAPAROSCOPIC BILATERAL SALPINGO OOPHERECTOMY Bilateral 04/15/2014   Procedure: LAPAROSCOPIC BILATERAL SALPINGO OOPHORECTOMY;  Surgeon: Marylynn Pearson, MD;  Location: [facility];  Service: Gynecology;  Laterality: Bilateral;  . TUBAL LIGATION      Family History  Problem Relation Age of Onset  . Diabetes Mother   . Hyperlipidemia Mother   . Hypertension Mother   . Prostate cancer Father   . Lung cancer Maternal Grandfather     smoked  . Emphysema Maternal Grandfather     smoked  . COPD Maternal Grandmother     never smoked   . Thyroid cancer Paternal Aunt   . Colon cancer Neg Hx   . Rectal cancer Neg Hx   . Stomach cancer Neg Hx   . Migraines Neg Hx   . Dementia Neg Hx     Social History   Social History  . Marital status:  Married    Spouse name: Alvester Chou  . Number of children: 6  . Years of education: 26   Occupational History  .       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 Smoking status: Current Every Day Smoker    Packs/day: 1.00    Years: 35.00    Types: Cigarettes, E-cigarettes    Last attempt to quit: 02/02/2014  . Smokeless tobacco: Never Used  . Alcohol use No  .  Drug use: No  . Sexual activity: Yes    Birth control/ protection: Pill   Other Topics Concern  . Not on file   Social History Narrative   5 living children, one child is a crack cocaine addict who often breaks into their house to steal money. One child died of cerebral palsy.   Caffeine use: Dr Malachi Bonds (3 per day)   2 cups coffee per day   The PMH, PSH, Social History, Family History, Medications, and allergies have been reviewed in [facility], and have been updated if relevant.       Review of Systems  HENT: Negative.   Respiratory: Positive for shortness of breath.   Gastrointestinal: Positive for abdominal distention. Negative for abdominal pain and anal bleeding.  Genitourinary: Negative.   Musculoskeletal: Negative.   Hematological: Negative.   Psychiatric/Behavioral: Negative.   All other systems reviewed and are negative.      Objective:    BP 132/62   Pulse 79   Temp 98.1 F (36.7 C) (Oral)   Wt 179 lb 4 oz (81.3 kg)   LMP 02/12/2014   SpO2 98%   BMI 32.79 kg/m   Wt Readings from Last 3 Encounters:  08/22/16 179 lb 4 oz (81.3 kg)  08/17/16 186 lb (84.4 kg)  08/10/16 181 lb 6.4 oz (82.3 kg)    Physical Exam  Constitutional: She is oriented to person, place, and time. She appears well-developed and well-nourished. No distress.  HENT:  Head: Normocephalic.  Eyes: Conjunctivae are normal.  Cardiovascular: Normal rate.   Pulmonary/Chest: Effort normal and breath sounds normal. No respiratory distress. She has no wheezes. She has no rales.  Abdominal: Soft. Bowel sounds are normal.  She exhibits distension.  Musculoskeletal: Normal range of motion.  Neurological: She is alert and oriented to person, place, and time. No cranial nerve deficit.  Skin: Skin is warm and dry. She is not diaphoretic.  Psychiatric: She has a normal mood and affect. Her behavior is normal. Judgment and thought content normal.  Nursing note and vitals reviewed.         Assessment & Plan:   Dyspnea - Plan: Spirometry: Peak  Need for influenza vaccination - Plan: Flu Vaccine QUAD 36+ mos PF IM (Fluarix & Fluzone Quad PF) No Follow-up on file.

## 2016-08-22 NOTE — Assessment & Plan Note (Signed)
Refer back to pulmonary. Cardiac work up neg.

## 2016-08-22 NOTE — Progress Notes (Signed)
Pre visit review using our clinic review tool, if applicable. No additional management support is needed unless otherwise documented below in the visit note. 

## 2016-08-28 ENCOUNTER — Telehealth: Payer: Self-pay | Admitting: Family Medicine

## 2016-08-28 NOTE — Telephone Encounter (Signed)
See results note. 

## 2016-08-28 NOTE — Telephone Encounter (Signed)
Pt returned call regarding ct scan  Please call [phone] Thanks

## 2016-08-29 ENCOUNTER — Ambulatory Visit (INDEPENDENT_AMBULATORY_CARE_PROVIDER_SITE_OTHER): Payer: BLUE CROSS/BLUE SHIELD | Admitting: Internal Medicine

## 2016-08-29 ENCOUNTER — Encounter: Payer: Self-pay | Admitting: Internal Medicine

## 2016-08-29 DIAGNOSIS — M25562 Pain in left knee: Secondary | ICD-10-CM

## 2016-08-29 DIAGNOSIS — M25561 Pain in right knee: Secondary | ICD-10-CM | POA: Diagnosis not present

## 2016-08-29 DIAGNOSIS — R0600 Dyspnea, unspecified: Secondary | ICD-10-CM

## 2016-08-29 MED ORDER — TRAMADOL HCL 50 MG PO TABS: ORAL_TABLET | ORAL | 0 refills | Status: DC

## 2016-08-29 MED ORDER — FAMOTIDINE 20 MG PO TABS: ORAL_TABLET | ORAL | 11 refills | Status: DC

## 2016-08-29 MED ORDER — PREDNISONE 10 MG PO TABS: ORAL_TABLET | ORAL | 0 refills | Status: DC

## 2016-08-29 NOTE — Assessment & Plan Note (Signed)
Trial off meloxicam 08/29/2016 as not helping > try prn tramadol short term and pred x 6 day taper  Difficult to put together knee pain with dyspnea but she insists they both started same time with leg swelling which led to neg cards eval for chf.  It may be she's having so much pain it's adding to her baseline anxiety but her exam is unimpressive so rec try short course pred/ rx pain with tramadol and ? Ortho eval next > Follow up per Primary Care planned

## 2016-08-29 NOTE — Patient Instructions (Addendum)
Try prilosec otc 20mg  x 2  Take 30-60 min before first meal of the day and Pepcid ac (famotidine) 20 mg one x one hour before  Bedtime  Stop lisinopril  For drainage / throat tickle try take CHLORPHENIRAMINE  4 mg - take one every 4 hours as needed - available over the counter- may cause drowsiness so start with just a bedtime dose or two and see how you tolerate it before trying in daytime    The key is to stop smoking completely before smoking completely stops you!   GERD (REFLUX)  is an extremely common cause of respiratory symptoms just like yours , many times with no obvious heartburn at all.    It can be treated with medication, but also with lifestyle changes including elevation of the head of your bed (ideally with 6 inch  bed blocks),  Smoking cessation, avoidance of late meals, excessive alcohol, and avoid fatty foods, chocolate, peppermint, colas, red wine, and acidic juices such as orange juice.  NO MINT OR MENTHOL PRODUCTS SO NO COUGH DROPS   USE SUGARLESS CANDY INSTEAD (Jolley ranchers or Stover's or Life Savers) or even ice chips will also do - the key is to swallow to prevent all throat clearing. NO OIL BASED VITAMINS - use powdered substitutes.   Stop meloxicam and try tramadol 50 mg one every 4 hours for pain and follow up with Dr Marjory Lies w/in the next two weeks to recheck your blood pressure and your knees for ? Orthopedic eval if needed   Prednisone 10 mg take  4 each am x 2 days,   2 each am x 2 days,  1 each am x 2 days and stop    Pulmonary follow up is as needed

## 2016-08-29 NOTE — Progress Notes (Signed)
Subjective:     Patient ID: Ashley [name], female   DOB: Jan 26, 1966,     MRN: [id number]  HPI    Brief patient profile:  2 yowf active smoker with a pattern of recurrent cough x 2014 typically resp to abx / prednisone similar spell Sep 30 2015 with cough > dx pna and never felt she recovered so referred to pulmonary clinic 12/07/2015 by DrAron   History of Present Illness  12/07/2015 [facility] office visit/ Ashley [name]   Chief Complaint  Patient presents with  . Pulmonary Consult    Referred by Dr. Deborra Medina. Pt c/o cough and SOB since 10/04/15. She rarely produces sputum, but when she does it is light yellow.  She states that is is mainly SOB with exertion, but occ notices at rest. She states that she gets out of breath just walking from room to room at home and sometimes just talking.   cough is worse p stir > min yellow mucus/ doesn't typically wake her in fact does better p gets to sleep No better with albuterol  Was already on prilosec up to an hour before bfast prior to onset of cough  Much Worse with voice use rec Try dulera 100 Take 2 puffs first thing in am and then another 2 puffs about 12 hours later until return  Work on inhaler technique:  Prednisone 10 mg take  4 each am x 2 days,   2 each am x 2 days,  1 each am x 2 days and stop  Take delsym two tsp every 12 hours and supplement if needed  Once you have eliminated the cough for 3 straight days try reducing the tramadol first,  then the delsym as tolerated.   Prilosec Take 30- 60 min before your first and last meals of the day  GERD diet    12/21/2015  f/u ov/Ashley [name] re: refractory cough  Chief Complaint  Patient presents with  . Follow-up    Cough is unchanged. Her breathing is no better or worse. No new co's today.   doe x 100 ft walking flat at nl pace Cough starts w/in a few min stirring and with voice use rec Stop dulera as this does not appear to be related to asthma and you do not have copd  Prednisone 10 mg  take  4 each am x 2 days,   2 each am x 2 days,  1 each am x 2 days and stop  Take delsym two tsp every 12 hours and supplement if needed with  tramadol 50 mg up to 2 every 4 hours to suppress the urge to cough For drainage / throat tickle try take CHLORPHENIRAMINE  4 mg - take one every 4 hours as needed - Prilosec Take 30- 60 min before your first and last meals of the day  GERD diet  Please schedule a follow up office visit in 2 weeks with all active medications in your hand , sooner if needed > did not return because 100% better   08/29/2016  reconsult re sob/ Ashley [name] unexplained sob recurrent off max gerd rx and on acei but no cough this time  Chief Complaint  Patient presents with  . Pulmonary Consult    Referred by Dr. Marjory Lies for eval of Dyspnea.  Pt states that she developed edema and SOB end of July 2017. She has been seen by cards and started on lisinopril.  She states she feels SOB all of the time, even at rest.  while at [facility] in July 2017  noted progressive swelling legs/ sob and then bilateral knee pain Onset sob was acute at rest/ worse walking/ also orthopnea >  propped  up since July x 30 degrees but smothers lying flatter.  Cardiac w/u inclluded LHC 08/17/16 nl pressures/ CA  No obvious day to day or daytime variability to sob or assoc excess/ purulent sputum or mucus plugs or hemoptysis or cp or chest tightness, subjective wheeze or overt sinus or hb symptoms. No unusual exp hx or h/o childhood pna/ asthma or knowledge of premature birth.  Sleeping ok without nocturnal  or early am exacerbation  of respiratory  c/o's or need for noct saba. Also denies any obvious fluctuation of symptoms with weather or environmental changes or other aggravating or alleviating factors except as outlined above   Current Medications, Allergies, Complete Past Medical History, Past Surgical History, Family History, and Social History were reviewed in [employer] record.  ROS  The  following are not active complaints unless bolded sore throat, dysphagia, dental problems, itching, sneezing,  nasal congestion or excess/ purulent secretions, ear ache,   fever, chills, sweats, unintended wt loss, classically pleuritic or exertional cp,  orthopnea pnd or leg swelling, presyncope, palpitations, abdominal pain, anorexia, nausea, vomiting, diarrhea  or change in bowel or bladder habits, change in stools or urine, dysuria,hematuria,  rash, arthralgias, visual complaints, headache, numbness, weakness or ataxia or problems with walking or coordination,  change in mood/affect or memory.             Review of Systems     Objective:   Physical Exam    amb anxious wf nad amb with four pronged walker due to severe knee pain  Wt Readings from Last 3 Encounters:  08/29/16 178 lb 12.8 oz (81.1 kg)  08/22/16 179 lb 4 oz (81.3 kg)  08/17/16 186 lb (84.4 kg)  Baseline wt =  159 11/2015   Vital signs reviewed  - note sats 99% RA on arrival    HEENT: nl dentition, turbinates, and oropharynx. Nl external ear canals without cough reflex   NECK :  without JVD/Nodes/TM/ nl carotid upstrokes bilaterally   LUNGS: no acc muscle use,  Nl contour chest which is clear to A and P bilaterally without cough on insp or exp maneuvers   CV:  RRR  no s3 or murmur or increase in P2, no edema   ABD:  soft and nontender with nl inspiratory excursion in the supine position. No bruits or organomegaly, bowel sounds nl  MS:  Nl gait/ ext warm without deformities, calf tenderness, cyanosis or clubbing No obvious joint restrictions though mild crepitance both knees without calor.  SKIN: warm and dry without lesions    NEURO:  alert, approp, nl sensorium with  no motor deficits     I personally reviewed images and agree with radiology impression as follows:  CTa Chest     07/26/16  No pulmonary emboli. No aortic pathology. No coronary artery calcification.  Linear atelectasis and/or scarring  in both lower lungs which was not present in November of 2016. No etiology identifiable.  Assessment:

## 2016-08-29 NOTE — Assessment & Plan Note (Signed)
12/21/2015   Walked RA x one lap @ 185 stopped due to End of study, nl pace, no sob or desat   - spirometry 12/21/2015 no obst > try off dulera  - Recurrent 06/2016 > w/u with neg CTa and LHC - rec max rx for GERD/d/c ACEi 08/29/2016 >>>     DDX of  difficult airways management almost all start with A and  include Adherence, Ace Inhibitors, Acid Reflux, Active Sinus Disease, Alpha 1 Antitripsin deficiency, Anxiety masquerading as Airways dz,  ABPA,  Allergy(esp in young), Aspiration (esp in elderly), Adverse effects of meds,  Active smokers, A bunch of PE's (a small clot burden can't cause this syndrome unless there is already severe underlying pulm or vascular dz with poor reserve) plus two Bs  = Bronchiectasis and Beta blocker use..and one C= CHF  ? Acid (or non-acid) GERD > always difficult to exclude as up to 75% of pts in some series report no assoc GI/ Heartburn symptoms> rec max (24h)  acid suppression and diet restrictions/ reviewed and instructions given in writing.   ? ACEi adverse effects (ongoing now though was not on ACEi at onset)  at the  top of the usual list of suspects and the only way to rule it out is a trial off > see a/p    ? Anxiety > usually at the bottom of this list of usual suspects but should be much higher on this pt's based on H and P and note already on psychotropics .   ? A bunch of pe's > CTa may miss very small peripheral blood clots but not a clot burden that would result in resting sob   Previous w/u was not fruitful but she did respond to max for gerd and did not need dulera and  Suspect that will happen again but now note ACEi which I would avoid in pt with unexplained sob (see hbp a/p)   Also note since previous eval she has also gained 20 lbs which certainly can' t be helpful.   Will arrange to see pt back in 4 weeks if not back to baseline   Total time devoted to counseling  = 35/27m review case with pt/ discussion of options/alternatives/ personally  creating written instructions  in presence of pt  then going over those specific  Instructions directly with the pt including how to use all of the meds but in particular covering each new medication in detail and the difference between the maintenance/automatic meds and the prns using an action plan format for the latter.

## 2016-09-03 ENCOUNTER — Ambulatory Visit (INDEPENDENT_AMBULATORY_CARE_PROVIDER_SITE_OTHER)
Admission: RE | Admit: 2016-09-03 | Discharge: 2016-09-03 | Disposition: A | Discharge status: Home / Self Care | Payer: BLUE CROSS/BLUE SHIELD | Source: Ambulatory Visit | Attending: Family Medicine | Admitting: Family Medicine

## 2016-09-03 ENCOUNTER — Ambulatory Visit
Admission: RE | Admit: 2016-09-03 | Discharge: 2016-09-03 | Disposition: A | Discharge status: Home / Self Care | Payer: BLUE CROSS/BLUE SHIELD | Source: Ambulatory Visit | Attending: Family Medicine | Admitting: Family Medicine

## 2016-09-03 ENCOUNTER — Other Ambulatory Visit: Payer: Self-pay | Admitting: *Deleted

## 2016-09-03 ENCOUNTER — Encounter: Payer: Self-pay | Admitting: Family Medicine

## 2016-09-03 ENCOUNTER — Ambulatory Visit (INDEPENDENT_AMBULATORY_CARE_PROVIDER_SITE_OTHER): Payer: BLUE CROSS/BLUE SHIELD | Admitting: Family Medicine

## 2016-09-03 VITALS — BP 120/74 | HR 88 | Temp 97.6°F | Ht 62.0 in | Wt 178.5 lb

## 2016-09-03 DIAGNOSIS — M25561 Pain in right knee: Secondary | ICD-10-CM

## 2016-09-03 DIAGNOSIS — M25562 Pain in left knee: Secondary | ICD-10-CM

## 2016-09-03 DIAGNOSIS — R600 Localized edema: Secondary | ICD-10-CM | POA: Diagnosis not present

## 2016-09-03 DIAGNOSIS — R739 Hyperglycemia, unspecified: Secondary | ICD-10-CM | POA: Diagnosis not present

## 2016-09-03 DIAGNOSIS — R140 Abdominal distension (gaseous): Secondary | ICD-10-CM

## 2016-09-03 MED ORDER — CLONAZEPAM 1 MG PO TABS: 1.0000 mg | ORAL_TABLET | Freq: Three times a day (TID) | ORAL | 0 refills | Status: DC | PRN

## 2016-09-03 MED ORDER — METHYLPREDNISOLONE ACETATE 40 MG/ML IJ SUSP
80.0000 mg | Freq: Once | INTRAMUSCULAR | Status: AC
  Administered 2016-09-03: 80 mg via INTRA_ARTICULAR

## 2016-09-03 NOTE — Progress Notes (Signed)
Pre visit review using our clinic review tool, if applicable. No additional management support is needed unless otherwise documented below in the visit note. 

## 2016-09-03 NOTE — Telephone Encounter (Signed)
Rx called in to requested pharmacy 

## 2016-09-03 NOTE — Progress Notes (Signed)
Dr. Frederico Hamman T. Briannon Boggio, MD, [facility] [place], [address] Phone: [phone] Fax: [phone]  09/03/2016  Patient: Ashley [name], MRN: [id number], DOB: [birth year], 50 y.o.  Primary Physician:  Arnette Norris, MD   Chief Complaint  Patient presents with  . Knee Pain  . Fall    x 4   Subjective:   Ashley [name] is a 50 y.o. very pleasant female patient who presents with the following:  I was asked to see the patient for knee pain.  It appears the globally she has been doing poorly. I looks as if she has had for ER visits or admissions in the last 4 months.  She she just had a cardiac catheterization, she has been evaluated by neurology, as well as pulmonology.  She is had edema, abdominal bloating,  And she reports to me that she has fallen 4 times in the last month.   I remember this patient well, and saw her earlier in the year for a minor knee injury.  Today the patient is using a walker.  She is followed for separate times,  But she cannot specifically point to any one place in her knee that is bothering her more than any other.  She has not really had any's effusions.  She has not had any mechanical symptoms.  She is not having any locking up.  Had botox for migraine.  Had a cardiac cath.  Past Medical History, Surgical History, Social History, Family History, Problem List, Medications, and Allergies have been reviewed and updated if relevant.  Patient Active Problem List   Diagnosis Date Noted  . Knee pain, bilateral 08/29/2016  . Abdominal distension 08/22/2016  . Bilateral leg edema 07/25/2016  . Paresthesia of right arm and leg 05/02/2016  . Dyspnea 12/21/2015  . Chronic bronchitis ([place]) 10/20/2015  . HLD (hyperlipidemia) 04/12/2015  . Migraine with aura, intractable 08/12/2014  . S/P hysterectomy with oophorectomy 04/15/2014  . Hyperglycemia 01/26/2013  . Migraine 05/30/2010  . Precordial pain 10/28/2009    . Anxiety state 09/30/2009  . INSOMNIA, CHRONIC 09/30/2009  . ADJUSTMENT DISORDER WITH MIXED FEATURES 09/30/2009  . ELEVATED BLOOD PRESSURE WITHOUT DIAGNOSIS OF HYPERTENSION 09/30/2009    Past Medical History:  Diagnosis Date  . Anxiety   . Bronchitis    uses inhaler if needed for bronchitis, lasted used -2014  . Cataract    bilateral  . Chronic headaches    migraines - in past, uses Phenergan for nausea   . Diabetes mellitus without complication ([place])    taken off Metformin since 04/2014, HgbA1C - normal, will follow up with PCP- Dr. Deborra Medina, 07/2014  . GERD (gastroesophageal reflux disease)   . H/O exercise stress test 2011   done at [facility]- told that it was WNL, done due to pt. having panic attacks   . History of blood transfusion 22[birth year]May-1966   at birth in [place], [place], unsure number of units  . Poor dentition    very poor oral health   . SVD (spontaneous vaginal delivery)    x 3  . Tobacco abuse     Past Surgical History:  Procedure Laterality Date  . ABDOMINAL HYSTERECTOMY    . APPENDECTOMY  1988  . CARDIAC CATHETERIZATION N/A 08/17/2016   Procedure: Left Heart Cath and Coronary Angiography;  Surgeon: Burnell Blanks, MD;  Location: [facility];  Service: Cardiovascular;  Laterality: N/A;  . CHOLECYSTECTOMY N/A 06/07/2014  Procedure: LAPAROSCOPIC CHOLECYSTECTOMY;  Surgeon: Ralene Ok, MD;  Location: [facility];  Service: General;  Laterality: N/A;  . CHOLECYSTECTOMY  June 07 2014  . COLONOSCOPY  02/16/2014   normal   . ESOPHAGOGASTRODUODENOSCOPY  12/28/2013  . KNEE ARTHROSCOPY  1995   left  . LAPAROSCOPIC ASSISTED VAGINAL HYSTERECTOMY N/A 04/15/2014   Procedure: LAPAROSCOPIC ASSISTED VAGINAL HYSTERECTOMY;  Surgeon: Marylynn Pearson, MD;  Location: [facility];  Service: Gynecology;  Laterality: N/A;  . LAPAROSCOPIC BILATERAL SALPINGO OOPHERECTOMY Bilateral 04/15/2014   Procedure: LAPAROSCOPIC BILATERAL SALPINGO OOPHORECTOMY;  Surgeon: Marylynn Pearson, MD;   Location: [facility];  Service: Gynecology;  Laterality: Bilateral;  . TUBAL LIGATION      Social History   Social History  . Marital status: Married    Spouse name: Alvester Chou  . Number of children: 6  . Years of education: 49   Occupational History  .       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 Smoking status: Current Every Day Smoker    Packs/day: 1.00    Years: 35.00    Types: Cigarettes, E-cigarettes  . Smokeless tobacco: Never Used  . Alcohol use No  . Drug use: No  . Sexual activity: Yes    Birth control/ protection: Pill   Other Topics Concern  . Not on file   Social History Narrative   5 living children, one child is a crack cocaine addict who often breaks into their house to steal money. One child died of cerebral palsy.   Caffeine use: Dr Malachi Bonds (3 per day)   2 cups coffee per day    Family History  Problem Relation Age of Onset  . Diabetes Mother   . Hyperlipidemia Mother   . Hypertension Mother   . Prostate cancer Father   . Lung cancer Maternal Grandfather     smoked  . Emphysema Maternal Grandfather     smoked  . COPD Maternal Grandmother     never smoked   . Thyroid cancer Paternal Aunt   . Colon cancer Neg Hx   . Rectal cancer Neg Hx   . Stomach cancer Neg Hx   . Migraines Neg Hx   . Dementia Neg Hx     Allergies  Allergen Reactions  . Penicillins Anaphylaxis    Throat swells, Has patient had a PCN reaction causing immediate rash, facial/tongue/throat swelling, SOB or lightheadedness with hypotension: Yes Has patient had a PCN reaction causing severe rash involving mucus membranes or skin necrosis: No Has patient had a PCN reaction that required hospitalization No Has patient had a PCN reaction occurring within the last 10 years: No If all of the above answers are "NO", then may proceed with Cephalosporin use.   . Topamax [Topiramate] Other (See Comments)    Hands and feet to go numb    Medication list reviewed and  updated in full in [facility].  GEN: No fevers, chills. Nontoxic. Primarily MSK c/o today. MSK: Detailed in the HPI GI: tolerating PO intake without difficulty Neuro: No numbness, parasthesias, or tingling associated. Otherwise the pertinent positives of the ROS are noted above.   Objective:   BP 120/74   Pulse 88   Temp 97.6 F (36.4 C) (Oral)   Ht 5\' 2"  (1.575 m)   Wt 178 lb 8 oz (81 kg)   LMP 02/12/2014   BMI 32.65 kg/m    GEN: WDWN, NAD, Non-toxic, Alert & Oriented x 3 HEENT: Atraumatic, Normocephalic.  Ears and Nose: No external deformity. EXTR: No clubbing/cyanosis/edema NEURO: tthe patient has significant difficulty rising from a seated position, and she uses her hands to elevate out of the chair using the sides of the chair, and additionally then using a walker to take weight off of her quadricep musculature.  PSYCH: Normally interactive. Conversant. Not depressed or anxious appearing.  Calm demeanor.    nontender at the proximal fibula or proximal tibia  At either  Knee.   Nontender at the tibial tubercle.   Nontender along the patellar tendon.   Mildly tender on medial and lateral joint lines.  Full extension.  Flexion to 120.  MCL and LCL are intact.   Lachman is negative.  PCL was intact.   McMurray's is negative.  Bounce home is negative.  She does have some mild pain with manipulation of the knee during both McMurray's examination and bounce home testing, but not true positive.  Radiology: Results for orders placed or performed during the hospital encounter of [id number]  Basic metabolic panel  Result Value Ref Range   Sodium 139 135 - 145 mmol/L   Potassium 4.3 3.5 - 5.1 mmol/L   Chloride 108 101 - 111 mmol/L   CO2 24 22 - 32 mmol/L   Glucose, Bld 136 (H) 65 - 99 mg/dL   BUN 14 6 - 20 mg/dL   Creatinine, Ser 0.98 0.44 - 1.00 mg/dL   Calcium 9.2 8.9 - 10.3 mg/dL   GFR calc non Af Amer >60 >60 mL/min   GFR calc Af Amer >60 >60 mL/min   Anion gap 7 5 -  15  Protime-INR  Result Value Ref Range   Prothrombin Time 12.9 11.4 - 15.2 seconds   INR 0.97   CBC  Result Value Ref Range   WBC 6.9 4.0 - 10.5 K/uL   RBC 4.21 3.87 - 5.11 MIL/uL   Hemoglobin 13.1 12.0 - 15.0 g/dL   HCT 40.3 36.0 - 46.0 %   MCV 95.7 78.0 - 100.0 fL   MCH 31.1 26.0 - 34.0 pg   MCHC 32.5 30.0 - 36.0 g/dL   RDW 13.7 11.5 - 15.5 %   Platelets 319 150 - 400 K/uL  Glucose, capillary  Result Value Ref Range   Glucose-Capillary 140 (H) 65 - 99 mg/dL   Comment 1 Notify RN     Lab Results  Component Value Date   HGBA1C 6.0 10/10/2015    Dg Knee Ap/lat W/sunrise Left  Result Date: 09/03/2016 CLINICAL DATA:  Knee pain after for fall some 1 month. EXAM: LEFT KNEE 3 VIEWS COMPARISON:  04/02/2016 FINDINGS: No evidence of fracture, dislocation, or joint effusion. No evidence of arthropathy or other focal bone abnormality. Soft tissues are unremarkable. IMPRESSION: Negative. Electronically Signed   By: Misty Stanley M.D.   On: 09/03/2016 14:44   Dg Knee Ap/lat W/sunrise Right  Result Date: 09/03/2016 CLINICAL DATA:  Knee pain, has fallen 4 times in a month EXAM: RIGHT KNEE 3 VIEWS COMPARISON:  None FINDINGS: Osseous mineralization normal. Medial compartment joint space narrowing. Joint spaces otherwise preserved No pleural effusion or pneumothorax. No joint effusion. IMPRESSION: Mild degenerative changes medial compartment RIGHT knee. No acute abnormalities. Electronically Signed   By: Lavonia Dana M.D.   On: 09/03/2016 14:32   Assessment and Plan:   Knee pain, bilateral  Left knee pain - Plan: DG Knee AP/LAT W/Sunrise Left, methylPREDNISolone acetate (DEPO-MEDROL) injection 80 mg  Right knee pain - Plan: DG Knee AP/LAT W/Sunrise Right,  methylPREDNISolone acetate (DEPO-MEDROL) injection 80 mg  Hyperglycemia  Bilateral leg edema  Abdominal distension   pplain films are very reassuring.  She is having some pain with examination, but it is reassuring that the patient  likely does not have any significant internal derangement.  Synovitis and inflammation is certainly possible given trauma.  As noted above, systemic illness as a driving factor in this case cannot be excluded.  I discussed this with the patient's primary care doctor face-to-face.  We will do bilateral knee injections today to see if this helps alleviate some of the patient's symptoms.  Knee Injection, R Patient verbally consented to procedure. Risks (including potential rare risk of infection), benefits, and alternatives explained. Sterilely prepped with Chloraprep. Ethyl cholride used for anesthesia. 8 cc Lidocaine 1% mixed with 2 mL Depo-Medrol 40 mg injected using the anteromedial approach without difficulty. No complications with procedure and tolerated well. Patient had decreased pain post-injection.   Knee Injection, L Patient verbally consented to procedure. Risks (including potential rare risk of infection), benefits, and alternatives explained. Sterilely prepped with Chloraprep. Ethyl cholride used for anesthesia. 8 cc Lidocaine 1% mixed with 2 mL Depo-Medrol 40 mg injected using the anteromedial approach without difficulty. No complications with procedure and tolerated well. Patient had decreased pain post-injection.   Follow-up: No Follow-up on file.  Modified Medications   Modified Medication Previous Medication   CLONAZEPAM (KLONOPIN) 1 MG TABLET clonazePAM (KLONOPIN) 1 MG tablet      Take 1 tablet (1 mg total) by mouth 3 (three) times daily as needed for anxiety.    Take 1 tablet (1 mg total) by mouth 3 (three) times daily as needed for anxiety.   Orders Placed This Encounter  Procedures  . DG Knee AP/LAT W/Sunrise Left  . DG Knee AP/LAT W/Sunrise Right    Signed,  Marshae Azam T. Karlena Luebke, MD   Patient's Medications  New Prescriptions   No medications on file  Previous Medications   AMITRIPTYLINE (ELAVIL) 50 MG TABLET    Take 1-1/2 tablet at dinner time   ASPIRIN EC 81 MG  TABLET    Take 81 mg by mouth daily.   DICYCLOMINE (BENTYL) 10 MG CAPSULE    TAKE ONE CAPSULE BY MOUTH TWICE DAILY AS NEEDED FOR  CRAMPING  AND  ABDOMINAL  PAIN   DOCUSATE SODIUM (COLACE) 100 MG CAPSULE    Take 100 mg by mouth at bedtime.   FAMOTIDINE (PEPCID) 20 MG TABLET    One at bedtime   FUROSEMIDE (LASIX) 40 MG TABLET    Take 1 tablet (40 mg total) by mouth daily.   OMEPRAZOLE (PRILOSEC) 20 MG CAPSULE    Take 20 mg by mouth 2 (two) times daily.    PREDNISONE (DELTASONE) 10 MG TABLET    Take  4 each am x 2 days,   2 each am x 2 days,  1 each am x 2 days and stop   PROMETHAZINE (PHENERGAN) 25 MG TABLET    Take 1 tablet (25 mg total) by mouth every 6 (six) hours as needed for nausea or vomiting.   RIZATRIPTAN (MAXALT-MLT) 10 MG DISINTEGRATING TABLET    Take 1 tablet (10 mg total) by mouth as needed for migraine. May repeat in 2 hours if needed. Do not take more than 3 tablets in a week.   TOPIRAMATE ER 25 MG CP24    Take 50 mg by mouth at bedtime.   TRAMADOL (ULTRAM) 50 MG TABLET    1-2 every 4 hours  as needed for cough or pain   TRAZODONE (DESYREL) 150 MG TABLET    Take 1 tablet (150 mg total) by mouth at bedtime.  Modified Medications   Modified Medication Previous Medication   CLONAZEPAM (KLONOPIN) 1 MG TABLET clonazePAM (KLONOPIN) 1 MG tablet      Take 1 tablet (1 mg total) by mouth 3 (three) times daily as needed for anxiety.    Take 1 tablet (1 mg total) by mouth 3 (three) times daily as needed for anxiety.  Discontinued Medications   No medications on file

## 2016-09-03 NOTE — Telephone Encounter (Signed)
#  90 last filled 07/25/16. Last ov was a f/u today with Dr. Lorelei Pont. No future appts scheduled.

## 2016-09-13 ENCOUNTER — Ambulatory Visit: Payer: BLUE CROSS/BLUE SHIELD | Admitting: Cardiology

## 2016-09-20 ENCOUNTER — Encounter: Payer: Self-pay | Admitting: Family Medicine

## 2016-09-20 ENCOUNTER — Ambulatory Visit (INDEPENDENT_AMBULATORY_CARE_PROVIDER_SITE_OTHER): Payer: BLUE CROSS/BLUE SHIELD | Admitting: Family Medicine

## 2016-09-20 VITALS — BP 110/70 | HR 76 | Temp 98.2°F | Ht 62.0 in | Wt 176.5 lb

## 2016-09-20 DIAGNOSIS — M6281 Muscle weakness (generalized): Secondary | ICD-10-CM | POA: Diagnosis not present

## 2016-09-20 DIAGNOSIS — G20C Parkinsonism, unspecified: Secondary | ICD-10-CM

## 2016-09-20 DIAGNOSIS — R200 Anesthesia of skin: Secondary | ICD-10-CM

## 2016-09-20 DIAGNOSIS — R262 Difficulty in walking, not elsewhere classified: Secondary | ICD-10-CM

## 2016-09-20 DIAGNOSIS — R208 Other disturbances of skin sensation: Secondary | ICD-10-CM | POA: Diagnosis not present

## 2016-09-20 DIAGNOSIS — G20 Parkinson's disease: Secondary | ICD-10-CM

## 2016-09-20 MED ORDER — OMEPRAZOLE 20 MG PO CPDR: 20.0000 mg | DELAYED_RELEASE_CAPSULE | Freq: Two times a day (BID) | ORAL | 1 refills | Status: DC

## 2016-09-20 NOTE — Progress Notes (Signed)
Dr. Frederico Hamman T. Sharanda Shinault, MD, [facility] [place], [address] Phone: [phone] Fax: [phone]  09/20/2016  Patient: Ashley [name], MRN: [id number], DOB: 1966-04-15, 50 y.o.  Primary Physician:  Arnette Norris, MD   Chief Complaint  Patient presents with  . Knee Pain  . Hip Pain  . Leg Pain    Legs feels like rubber   Subjective:   Ashley [name] is a 50 y.o. very pleasant female patient who presents with the following:  The patient is a patient of my partner Dr. Deborra Medina. I saw her 17 days ago after she had fallen and was having some knee pain.  At that time her  Plain x-rays were relatively unremarkable and we did bilateral knee intra-articular injections with corticosteroid.  Her knees now are feeling quite a bit better than they were, but she is still doing poorly from a global standpoint.  Within the last few weeks she has developed a new onset tremor of both hands, but the right hand is primarily affected.  Over the course of the last few months she is seen multiple physicians including going to the hospital, going to the ER multiple times, and been admitted with a cardiac catheterization.  She has gone from walking unassisted without any assistive devices to now walking with a walker with great difficulty and having significant difficulties with even basic strength in her extremities.  She also has been having some progressive dyspnea on exertion  With a normal cardiac catheterization, and she also was recently seen by pulmonology.  Both legs are "shaky when she get up under her feet." Dull constant pain - she is also having some posterior pain.   Shaking of hands - more on the right hand. tthe patient never had a tremor prior to approximately 6 weeks ago. She is having a very difficult time rising from a seated position.  Rubbery legs and very weak  Arms are weak.  Weak 4/5 or so, 3++ at triceps UE Wrists 4+/5  R hip  3+/5 L hip 3+/5 R knee 3++/5 R HS 4+/5 L HS 4/5  Feet 4/5 dorsiflexion Plantarflexion 5/5  Sometimes legs fall and will give out.  09/03/2016 Last OV with Owens Loffler, MD  I was asked to see the patient for knee pain.  It appears the globally she has been doing poorly. I looks as if she has had for ER visits or admissions in the last 4 months.  She she just had a cardiac catheterization, she has been evaluated by neurology, as well as pulmonology.  She is had edema, abdominal bloating,  And she reports to me that she has fallen 4 times in the last month.   I remember this patient well, and saw her earlier in the year for a minor knee injury.  Today the patient is using a walker.  She is followed for separate times,  But she cannot specifically point to any one place in her knee that is bothering her more than any other.  She has not really had any's effusions.  She has not had any mechanical symptoms.  She is not having any locking up.  Had botox for migraine.  Had a cardiac cath.  Past Medical History, Surgical History, Social History, Family History, Problem List, Medications, and Allergies have been reviewed and updated if relevant.  Patient Active Problem List   Diagnosis Date Noted  . Knee pain, bilateral 08/29/2016  .  Abdominal distension 08/22/2016  . Bilateral leg edema 07/25/2016  . Paresthesia of right arm and leg 05/02/2016  . Dyspnea 12/21/2015  . Chronic bronchitis ([place]) 10/20/2015  . HLD (hyperlipidemia) 04/12/2015  . Migraine with aura, intractable 08/12/2014  . S/P hysterectomy with oophorectomy 04/15/2014  . Hyperglycemia 01/26/2013  . Migraine 05/30/2010  . Precordial pain 10/28/2009  . Anxiety state 09/30/2009  . INSOMNIA, CHRONIC 09/30/2009  . ADJUSTMENT DISORDER WITH MIXED FEATURES 09/30/2009  . ELEVATED BLOOD PRESSURE WITHOUT DIAGNOSIS OF HYPERTENSION 09/30/2009    Past Medical History:  Diagnosis Date  . Anxiety   . Bronchitis    uses inhaler  if needed for bronchitis, lasted used -2014  . Cataract    bilateral  . Chronic headaches    migraines - in past, uses Phenergan for nausea   . Diabetes mellitus without complication ([place])    taken off Metformin since 04/2014, HgbA1C - normal, will follow up with PCP- Dr. Deborra Medina, 07/2014  . GERD (gastroesophageal reflux disease)   . H/O exercise stress test 2011   done at [facility]- told that it was WNL, done due to pt. having panic attacks   . History of blood transfusion [birth year]08-1966   at birth in [place], [place], unsure number of units  . Poor dentition    very poor oral health   . SVD (spontaneous vaginal delivery)    x 3  . Tobacco abuse     Past Surgical History:  Procedure Laterality Date  . ABDOMINAL HYSTERECTOMY    . APPENDECTOMY  1988  . CARDIAC CATHETERIZATION N/A 08/17/2016   Procedure: Left Heart Cath and Coronary Angiography;  Surgeon: Burnell Blanks, MD;  Location: [facility];  Service: Cardiovascular;  Laterality: N/A;  . CHOLECYSTECTOMY N/A 06/07/2014   Procedure: LAPAROSCOPIC CHOLECYSTECTOMY;  Surgeon: Ralene Ok, MD;  Location: [facility];  Service: General;  Laterality: N/A;  . CHOLECYSTECTOMY  June 07 2014  . COLONOSCOPY  02/16/2014   normal   . ESOPHAGOGASTRODUODENOSCOPY  12/28/2013  . KNEE ARTHROSCOPY  1995   left  . LAPAROSCOPIC ASSISTED VAGINAL HYSTERECTOMY N/A 04/15/2014   Procedure: LAPAROSCOPIC ASSISTED VAGINAL HYSTERECTOMY;  Surgeon: Marylynn Pearson, MD;  Location: [facility];  Service: Gynecology;  Laterality: N/A;  . LAPAROSCOPIC BILATERAL SALPINGO OOPHERECTOMY Bilateral 04/15/2014   Procedure: LAPAROSCOPIC BILATERAL SALPINGO OOPHORECTOMY;  Surgeon: Marylynn Pearson, MD;  Location: [facility];  Service: Gynecology;  Laterality: Bilateral;  . TUBAL LIGATION      Social History   Social History  . Marital status: Married    Spouse name: Alvester Chou  . Number of children: 6  . Years of education: 62   Occupational History  .       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 Smoking status: Current Every Day Smoker    Packs/day: 1.00    Years: 35.00    Types: Cigarettes, E-cigarettes  . Smokeless tobacco: Never Used  . Alcohol use No  . Drug use: No  . Sexual activity: Yes    Birth control/ protection: Pill   Other Topics Concern  . Not on file   Social History Narrative   5 living children, one child is a crack cocaine addict who often breaks into their house to steal money. One child died of cerebral palsy.   Caffeine use: Dr Malachi Bonds (3 per day)   2 cups coffee per day    Family History  Problem Relation Age of Onset  . Diabetes  Mother   . Hyperlipidemia Mother   . Hypertension Mother   . Prostate cancer Father   . Lung cancer Maternal Grandfather     smoked  . Emphysema Maternal Grandfather     smoked  . COPD Maternal Grandmother     never smoked   . Thyroid cancer Paternal Aunt   . Colon cancer Neg Hx   . Rectal cancer Neg Hx   . Stomach cancer Neg Hx   . Migraines Neg Hx   . Dementia Neg Hx     Allergies  Allergen Reactions  . Penicillins Anaphylaxis    Throat swells, Has patient had a PCN reaction causing immediate rash, facial/tongue/throat swelling, SOB or lightheadedness with hypotension: Yes Has patient had a PCN reaction causing severe rash involving mucus membranes or skin necrosis: No Has patient had a PCN reaction that required hospitalization No Has patient had a PCN reaction occurring within the last 10 years: No If all of the above answers are "NO", then may proceed with Cephalosporin use.   . Topamax [Topiramate] Other (See Comments)    Hands and feet to go numb    Medication list reviewed and updated in full in [place].  GEN: No fevers, chills. Nontoxic. MSK: Detailed in the HPI, KNEE PAIN PRESENT BUT IMPROVING Over the last 2 months, intermittent SOB GI: tolerating PO intake without difficulty Neuro: NEW ONSET WEAKNESS AND TREMOR OF BOTH HANDS MULTIPLE  RECENT FALLS NO ACTIVE CHEST PAIN Otherwise, the pertinent positives and negatives are listed above and in the HPI, otherwise a full review of systems has been reviewed and is negative unless noted positive.   Objective:   BP 110/70   Pulse 76   Temp 98.2 F (36.8 C) (Oral)   Ht 5\' 2"  (1.575 m)   Wt 176 lb 8 oz (80.1 kg)   LMP 02/12/2014   BMI 32.28 kg/m   No exam data present  GEN: well developed, well nourished, no acute distress Eyes: conjunctiva and lids normal, PERRLA, EOMI ENT: TM clear, nares clear, oral exam WNL Neck: supple, no lymphadenopathy, no thyromegaly, no JVD Pulm: clear to auscultation and percussion, respiratory effort normal CV: regular rate and rhythm, S1-S2, no murmur, rub or gallop, no bruits Chest: no scars, masses, no lumps GI: soft, non-tender; no hepatosplenomegaly, masses; active bowel sounds all quadrants Lymph: no cervical, axillary or inguinal adenopathy SKIN: clear, good turgor, color WNL, no rashes, lesions, or ulcerations Psych: alert; oriented to person, place and time, normally interactive and not anxious or depressed in appearance.   Neuro: CN 2-12 grossly intact. PERRLA. EOMI. Sensation decreased to soft touch in LE without focal area. Str per below. DTR 2+. No clonus. A and o x 4. Romberg unable to complete. Heel -shin unable to complete.   When rising from a seated position the patient bends over significantly at the waist and appears to have difficulty engaging her quadricep musculature.   White gait and uses walker to  Elevate herself along with her upper body.  Markedly abnormal even compared to prior examination 3 weeks ago.  Weak bicep 4/5 or so, 3++ at triceps UE Wrists 4+/5 Grip 3+/5  R hip 3+/5 L hip 3+/5 R knee 3++/5 R HS 4+/5 L HS 4/5  Feet 4/5 dorsiflexion Plantarflexion 5/5     MSK: nontender at the proximal fibula or proximal tibia  At either  Knee.   Nontender at the tibial tubercle.   Nontender along the patellar  tendon.  Mildly tender on medial and lateral joint lines.  Full extension.  Flexion to 120.  MCL and LCL are intact.   Lachman is negative.  PCL was intact.   McMurray's is negative.  Bounce home is negative.  She does have some mild pain with manipulation of the knee during both McMurray's examination and bounce home testing, but not true positive.  Radiology: Results for orders placed or performed during the hospital encounter of [id number]  Basic metabolic panel  Result Value Ref Range   Sodium 139 135 - 145 mmol/L   Potassium 4.3 3.5 - 5.1 mmol/L   Chloride 108 101 - 111 mmol/L   CO2 24 22 - 32 mmol/L   Glucose, Bld 136 (H) 65 - 99 mg/dL   BUN 14 6 - 20 mg/dL   Creatinine, Ser 0.98 0.44 - 1.00 mg/dL   Calcium 9.2 8.9 - 10.3 mg/dL   GFR calc non Af Amer >60 >60 mL/min   GFR calc Af Amer >60 >60 mL/min   Anion gap 7 5 - 15  Protime-INR  Result Value Ref Range   Prothrombin Time 12.9 11.4 - 15.2 seconds   INR 0.97   CBC  Result Value Ref Range   WBC 6.9 4.0 - 10.5 K/uL   RBC 4.21 3.87 - 5.11 MIL/uL   Hemoglobin 13.1 12.0 - 15.0 g/dL   HCT 40.3 36.0 - 46.0 %   MCV 95.7 78.0 - 100.0 fL   MCH 31.1 26.0 - 34.0 pg   MCHC 32.5 30.0 - 36.0 g/dL   RDW 13.7 11.5 - 15.5 %   Platelets 319 150 - 400 K/uL  Glucose, capillary  Result Value Ref Range   Glucose-Capillary 140 (H) 65 - 99 mg/dL   Comment 1 Notify RN     Lab Results  Component Value Date   HGBA1C 6.0 10/10/2015    CLINICAL DATA:  50 year old diabetic hypertensive female with sudden onset of right arm numbness and right leg heaviness this morning. Worsening headache over the past 2 days. History migraines. Subsequent encounter.  EXAM: MRI HEAD WITHOUT CONTRAST  TECHNIQUE: Multiplanar, multiecho pulse sequences of the brain and surrounding structures were obtained without intravenous contrast.  COMPARISON:  04/27/2016 and 07/02/2010 head CT. 11/10/2014 brain MR.  FINDINGS: No acute infarct or intracranial  hemorrhage.  No intracranial mass lesion noted on this unenhanced exam.  No age advanced atrophy hydrocephalus.  Nonspecific posterior superior left calvarial lesion with fat intensity may represent small hemangioma without significant change from 2011.  Cervical medullary junction, pituitary region, pineal region and orbital structures unremarkable.  Major intracranial vascular structures are patent.  Partial opacification right mastoid air cells once again noted without obstructing lesion of the eustachian tube noted. Minimal mucosal thickening ethmoid sinus air cells and right sphenoid sinus.  IMPRESSION: No acute infarct.  Persistent mild opacification right mastoid air cells and minimal paranasal sinus mucosal thickening.   Electronically Signed   By: Genia Del M.D.   On: 04/27/2016 18:05  CLINICAL DATA:  Worsening headaches  EXAM: MRI HEAD WITHOUT AND WITH CONTRAST  TECHNIQUE: Multiplanar, multiecho pulse sequences of the brain and surrounding structures were obtained without and with intravenous contrast.  CONTRAST:  13mL MULTIHANCE GADOBENATE DIMEGLUMINE 529 MG/ML IV SOLN  COMPARISON:  CT head 07/02/2010  FINDINGS: Ventricle size is normal. Cerebral volume is normal. Pituitary normal in size. Craniocervical junction normal. Calvarium intact.  Negative for acute or chronic infarction  Negative for demyelinating disease. Cerebral white matter is normal.  Brainstem and cerebellum are normal  Negative for hemorrhage or mass.  Normal enhancement following contrast infusion. No enhancing mass lesion  Small mastoid sinus effusion in the right mastoid tip. Remaining sinuses are clear.  IMPRESSION: Normal MRI of the brain with contrast.  Small right mastoid sinus effusion.   Electronically Signed   By: Franchot Gallo M.D.   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 >40 minutes spent in face to face time with patient, >50% spent in counselling or coordination of care   I am highly concerned about this patient who is had a marked shift in her state of health now with significant functional impairment, dramatic difficulty with walking with a walker, weakness in her extremities, and a new onset tremor that appears to be Parkinsonian in character.  These issues are not orthopedic.  All imaging studies as well as cardiology notes and pulmonology notes as well as cardiac catheterization data  Has been reviewed.  High level of clinical concern for new onset neurological problem.  Parkinson's disease, MS, MG, ALS, or other neurological condition cannot be excluded.  Discussed this case with primary care provider face-to-face.  I'm going to ask for urgent neurological consultation in this case for subspecialty expertise.  We appreciate their assistance.  Follow-up: No Follow-up on file.  New Prescriptions   No medications on file   Modified Medications   Modified Medication Previous Medication   OMEPRAZOLE (PRILOSEC) 20 MG CAPSULE omeprazole (PRILOSEC) 20 MG capsule      Take 1 capsule (20 mg total) by mouth 2 (two) times daily.    Take 20 mg by mouth 2 (two) times daily.    Orders Placed This Encounter  Procedures  . Ambulatory referral to Neurology    Signed,  Frederico Hamman T. Mintie Witherington, MD   Patient's Medications  New Prescriptions   No medications on file  Previous Medications   AMITRIPTYLINE (ELAVIL) 50 MG TABLET    Take 1-1/2 tablet at dinner time   ASPIRIN EC 81 MG TABLET    Take 81 mg by mouth daily.   CLONAZEPAM (KLONOPIN) 1 MG TABLET    Take 1 tablet (1 mg total) by mouth 3 (three) times  daily as needed for anxiety.   DICYCLOMINE (BENTYL) 10 MG CAPSULE    TAKE ONE CAPSULE BY MOUTH TWICE DAILY AS NEEDED FOR  CRAMPING  AND  ABDOMINAL  PAIN   DOCUSATE SODIUM (COLACE) 100 MG CAPSULE    Take 100 mg by mouth at bedtime.   FAMOTIDINE (PEPCID) 20 MG TABLET    One at bedtime   FUROSEMIDE (LASIX) 40 MG TABLET    Take 1 tablet (40 mg total) by mouth daily.   PREDNISONE (DELTASONE) 10 MG TABLET    Take  4 each am x 2 days,   2 each am x 2 days,  1 each am x 2 days and stop   PROMETHAZINE (PHENERGAN) 25 MG TABLET    Take 1 tablet (25 mg total) by mouth every 6 (six) hours as needed for nausea or vomiting.   RIZATRIPTAN (MAXALT-MLT) 10 MG DISINTEGRATING TABLET    Take 1 tablet (10 mg total) by mouth as needed for migraine. May repeat in 2 hours  if needed. Do not take more than 3 tablets in a week.   TOPIRAMATE ER 25 MG CP24    Take 50 mg by mouth at bedtime.   TRAMADOL (ULTRAM) 50 MG TABLET    1-2 every 4 hours as needed for cough or pain   TRAZODONE (DESYREL) 150 MG TABLET    Take 1 tablet (150 mg total) by mouth at bedtime.  Modified Medications   Modified Medication Previous Medication   OMEPRAZOLE (PRILOSEC) 20 MG CAPSULE omeprazole (PRILOSEC) 20 MG capsule      Take 1 capsule (20 mg total) by mouth 2 (two) times daily.    Take 20 mg by mouth 2 (two) times daily.   Discontinued Medications   No medications on file

## 2016-09-20 NOTE — Patient Instructions (Signed)

## 2016-09-20 NOTE — Progress Notes (Signed)
Pre visit review using our clinic review tool, if applicable. No additional management support is needed unless otherwise documented below in the visit note. 

## 2016-09-25 ENCOUNTER — Encounter: Payer: Self-pay | Admitting: Neurology

## 2016-09-25 ENCOUNTER — Ambulatory Visit (INDEPENDENT_AMBULATORY_CARE_PROVIDER_SITE_OTHER): Payer: BLUE CROSS/BLUE SHIELD | Admitting: Neurology

## 2016-09-25 VITALS — BP 127/77 | HR 86 | Ht 62.0 in | Wt 175.8 lb

## 2016-09-25 DIAGNOSIS — G629 Polyneuropathy, unspecified: Secondary | ICD-10-CM | POA: Diagnosis not present

## 2016-09-25 DIAGNOSIS — R531 Weakness: Secondary | ICD-10-CM

## 2016-09-25 DIAGNOSIS — M6281 Muscle weakness (generalized): Secondary | ICD-10-CM

## 2016-09-25 NOTE — Patient Instructions (Signed)
Remember to drink plenty of fluid, eat healthy meals and do not skip any meals. Try to eat protein with a every meal and eat a healthy snack such as fruit or nuts in between meals. Try to keep a regular sleep-wake schedule and try to exercise daily, particularly in the form of walking, 20-30 minutes a day, if you can.   As far as diagnostic testing: EMG/NCS  I would like to see you back for emg/ncs, sooner if we need to. Please call us with any interim questions, concerns, problems, updates or refill requests.   Our phone number is [phone]. We also have an after hours call service for urgent matters and there is a physician on-call for urgent questions. For any emergencies you know to call 911 or go to the nearest emergency room   

## 2016-09-25 NOTE — Progress Notes (Signed)
[id number]    Provider:  Dr Jaynee Eagles Referring Provider: Lucille Passy, MD Primary Care Physician:  Arnette Norris, MD   CC:  Migraine  Interval history: 50 y.o. female here as a referral from Dr. Deborra Medina for new onset tremor. Past medical history of intractable migraine, prediabetes, depression, insomnia, anxiety, back pain, significant psychiatric stressors.. She has significant depression and anxiety and I have recommended psychiatric follow up in the past. She has been seen at [facility] for unilateral symptoms in the past by our stroke team and exam was consistent with inorganic causes possibly stress. My exams have also not been onsistent with organic cause, showing poor effort and give way on the right, splitting exactly midline with vibration and pinprick.   She has significant life stressors. Today she appears with acute onset tremor and weakness. Per Dr. Lorelei Pont, Over the course of the last few months she is seen multiple physicians including going to the hospital, going to the ER multiple times, and been admitted with a cardiac catheterization(normal).  She has gone from walking unassisted without any assistive devices to now walking with a walker with great difficulty and having significant difficulties with even basic strength in her extremities.   She has been having more weakness. Legs feel like they are rubber and weigh 100 pounds. Weak in the legs. The tremors are not there all the time. Tremors are when watching TV. And when smoking a cigarette she drops it. She says she has a tremor, no tremor today at rest, it is distractable. She has been to multiple doctors including tcardiac and pulmonology and nothing is wrong. Started in early august and getting worse. She has numbness and tingling in the feet which is new. Weakness in the legs and the arms. The right side is still worse. No diplopia, no facial weakness. Headaches are better.    Initial HPI 05/10/2016:  Ashley [name]  is a 50 y.o. female here as a referral from Dr. Deborra Medina for once visit. Past medical history of intractable migraine, prediabetes, depression, insomnia, anxiety, back pain, significant psychiatric stressors. She was diagnosed with cluster headaches by Dr. Delice Lesch and is being treated with vearapamil, she also has migraines.  She sees Dr. Delice Lesch and she will be going back, Dr. Delice Lesch is on maternity leave at this time. She is here for evaluation of TIA over she was seen in the emergency room. Exam was consistent with nonorganic features in the right upper and lower extremities with splitting of vibration at the forehead. MRI of the brain was negative and she was sent home. Patient is here with her husband today who provides information as well. Patient says she ate a biscuit and then she started feelig weird and "numby and heavy" and the whole right side started feeling tingling. She still has the symptoms. She was seen in the emergency room, she was given shots. She was having headaches before this happened. No head trauma, symptoms started on her couch about 11am acute onset. She has continued tingly feelings on the right side, she says the symptoms are exactly just on the right side of her body, she says if you drqw a line directly down the middle of her body fron the face to the chest abdomen and pelvis exactly down the middle the whole right side doesn't feel right. She has a daily continuous headache since this episode. She had started having a very bad headache before the right-sided symptoms. She is getting confused. She says she  is having memory problems as well. Daily continuous headaches for years,  +phono/photophobiua, +nausea.. The headaches on the right side, throbbing and stabbing, she has light sensitivity, sounds sensitivity, nausea, vomiting. She has to sit in a dark room. It is continuous. She has 30 headaches out of month continuous, 20 or more are migrainous, can be as bad as a 10/10 and on average  it is 6/10. No aura. No medication overuse. Denies lacrimation, injection, other signs or symptoms of cluster at this time. The right-sided symptoms occur with the migraines.   Reviewed notes, labs and imaging from outside physicians, which showed:   Reviewed notes from patient's emergency room visit that she is here to evaluate for: She was assessed by Dr. Leonie Man personally in the emergency room. Motor system exam revealed poor effort variable drift with nonorganic features in the right upper and lower extremities. Patient split midline with vibration. NH stroke scale was 3. TPA was not given. MRI of the brain was negative for acute event as was CT of the head. She was given Toradol IV and Reglan to treat her headaches. She was sent home and not admitted.  Patient also been evaluated by Dr. Delice Lesch at [facility] neurology in 2016 for worsening headaches with memory changes and paresthesias. 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  Medications tried and failed: Verapamil, amitriptyline, Topamax, reglan, zofran, Relpax, Imitrex, a course of steroids, could not obtain triptan nasal spray due to insurance issues.   MRI of the brain 04/27/2016 (personally reviewed imaging and agree with the following): No acute infarct or intracranial hemorrhage.  No intracranial mass lesion noted on this unenhanced exam.  No age advanced atrophy hydrocephalus.  Nonspecific posterior superior left calvarial lesion with fat intensity may represent small hemangioma without significant change from 2011.  Cervical medullary junction, pituitary region, pineal region and orbital structures unremarkable.  Major intracranial vascular structures are patent.  Partial opacification right mastoid air cells once again noted without  obstructing lesion of the eustachian tube noted. Minimal mucosal thickening ethmoid sinus air cells and right sphenoid sinus.  IMPRESSION: No acute infarct.  Persistent mild opacification right mastoid air cells and minimal paranasal sinus mucosal thickening.  Urine rapid drug screen was negative, CBC showed elevated white blood cells 11.1, CMP was normal except for elevated glucose at 155.   Review of Systems: Patient complains of symptoms per HPI as well as the following symptoms: Fatigue, blurred vision, ringing in ears, joint pain, confusion, headache, numbness, weakness, dizziness, insomnia, anxiety and decreased energy. Pertinent negatives per HPI. All others negative.   Social History   Social History  . Marital status: Married    Spouse name: Alvester Chou  . Number of children: 6  . Years of education: 68   Occupational History  .       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 Smoking status: Current Every Day Smoker    Packs/day: 1.00    Years: 35.00    Types: Cigarettes, E-cigarettes  . Smokeless tobacco: Never Used  . Alcohol use No  . Drug use: No  . Sexual activity: Yes    Birth control/ protection: Pill   Other Topics Concern  . Not on file   Social History Narrative   5 living children, one child is  a crack cocaine addict who often breaks into their house to steal money. One child died of cerebral palsy.   Caffeine use: Dr Malachi Bonds (3 per day)   2 cups coffee per day    Family History  Problem Relation Age of Onset  . Diabetes Mother   . Hyperlipidemia Mother   . Hypertension Mother   . Prostate cancer Father   . Lung cancer Maternal Grandfather     smoked  . Emphysema Maternal Grandfather     smoked  . COPD Maternal Grandmother     never smoked   . Thyroid cancer Paternal Aunt   . Colon cancer Neg Hx   . Rectal cancer Neg Hx   . Stomach cancer Neg Hx   . Migraines Neg Hx   . Dementia Neg Hx     Past Medical History:    Diagnosis Date  . Anxiety   . Bronchitis    uses inhaler if needed for bronchitis, lasted used -2014  . Cataract    bilateral  . Chronic headaches    migraines - in past, uses Phenergan for nausea   . Diabetes mellitus without complication ([place])    taken off Metformin since 04/2014, HgbA1C - normal, will follow up with PCP- Dr. Deborra Medina, 07/2014  . GERD (gastroesophageal reflux disease)   . H/O exercise stress test 2011   done at [facility]- told that it was WNL, done due to pt. having panic attacks   . History of blood transfusion 30[birth year]1966   at birth in [place], [place], unsure number of units  . Poor dentition    very poor oral health   . SVD (spontaneous vaginal delivery)    x 3  . Tobacco abuse     Past Surgical History:  Procedure Laterality Date  . ABDOMINAL HYSTERECTOMY    . APPENDECTOMY  1988  . CARDIAC CATHETERIZATION N/A 08/17/2016   Procedure: Left Heart Cath and Coronary Angiography;  Surgeon: Burnell Blanks, MD;  Location: [place] [facility];  Service: Cardiovascular;  Laterality: N/A;  . CHOLECYSTECTOMY N/A 06/07/2014   Procedure: LAPAROSCOPIC CHOLECYSTECTOMY;  Surgeon: Ralene Ok, MD;  Location: [facility];  Service: General;  Laterality: N/A;  . CHOLECYSTECTOMY  June 07 2014  . COLONOSCOPY  02/16/2014   normal   . ESOPHAGOGASTRODUODENOSCOPY  12/28/2013  . KNEE ARTHROSCOPY  1995   left  . LAPAROSCOPIC ASSISTED VAGINAL HYSTERECTOMY N/A 04/15/2014   Procedure: LAPAROSCOPIC ASSISTED VAGINAL HYSTERECTOMY;  Surgeon: Marylynn Pearson, MD;  Location: [facility];  Service: Gynecology;  Laterality: N/A;  . LAPAROSCOPIC BILATERAL SALPINGO OOPHERECTOMY Bilateral 04/15/2014   Procedure: LAPAROSCOPIC BILATERAL SALPINGO OOPHORECTOMY;  Surgeon: Marylynn Pearson, MD;  Location: [place] [facility];  Service: Gynecology;  Laterality: Bilateral;  . TUBAL LIGATION      Current Outpatient Prescriptions  Medication Sig Dispense Refill  . amitriptyline (ELAVIL) 50 MG tablet Take 1-1/2 tablet at dinner  time (Patient taking differently: Take 75 mg by mouth daily after supper. ) 45 tablet 3  . aspirin EC 81 MG tablet Take 81 mg by mouth daily.    . clonazePAM (KLONOPIN) 1 MG tablet Take 1 tablet (1 mg total) by mouth 3 (three) times daily as needed for anxiety. 90 tablet 0  . dicyclomine (BENTYL) 10 MG capsule TAKE ONE CAPSULE BY MOUTH TWICE DAILY AS NEEDED FOR  CRAMPING  AND  ABDOMINAL  PAIN (Patient taking differently: Take 10 mg by mouth at bedtime. FOR  CRAMPING  AND  ABDOMINAL  PAIN) 60 capsule  0  . docusate sodium (COLACE) 100 MG capsule Take 100 mg by mouth at bedtime.    . famotidine (PEPCID) 20 MG tablet One at bedtime 30 tablet 11  . furosemide (LASIX) 40 MG tablet Take 1 tablet (40 mg total) by mouth daily. (Patient taking differently: Take 40 mg by mouth at bedtime. ) 30 tablet 6  . omeprazole (PRILOSEC) 20 MG capsule Take 1 capsule (20 mg total) by mouth 2 (two) times daily. 180 capsule 1  . predniSONE (DELTASONE) 10 MG tablet Take  4 each am x 2 days,   2 each am x 2 days,  1 each am x 2 days and stop 14 tablet 0  . promethazine (PHENERGAN) 25 MG tablet Take 1 tablet (25 mg total) by mouth every 6 (six) hours as needed for nausea or vomiting. (Patient taking differently: Take 25 mg by mouth 2 (two) times daily as needed (nausea from migraines). ) 30 tablet 0  . rizatriptan (MAXALT-MLT) 10 MG disintegrating tablet Take 1 tablet (10 mg total) by mouth as needed for migraine. May repeat in 2 hours if needed. Do not take more than 3 tablets in a week. (Patient taking differently: Take 10 mg by mouth See admin instructions. Take 1 tablet (10 mg) by mouth at onset of migraine headache, may repeat in 2 hours if needed. Do not take more than 3 tablets in a week.) 10 tablet 3  . Topiramate ER 25 MG CP24 Take 50 mg by mouth at bedtime. (Patient taking differently: Take 50 mg by mouth at bedtime. For migraines) 60 capsule 11  . traMADol (ULTRAM) 50 MG tablet 1-2 every 4 hours as needed for cough or  pain 40 tablet 0  . traZODone (DESYREL) 150 MG tablet Take 1 tablet (150 mg total) by mouth at bedtime. 90 tablet 1   No current facility-administered medications for this visit.     Allergies as of 09/25/2016 - Review Complete 09/20/2016  Allergen Reaction Noted  . Penicillins Anaphylaxis   . Topamax [topiramate] Other (See Comments) 02/12/2014    Vitals: BP 127/77 (BP Location: Left Arm, Patient Position: Sitting, Cuff Size: Normal)   Pulse 86   Ht 5\' 2"  (1.575 m)   Wt 175 lb 12.8 oz (79.7 kg)   LMP 02/12/2014   BMI 32.15 kg/m  Last Weight:  Wt Readings from Last 1 Encounters:  09/25/16 175 lb 12.8 oz (79.7 kg)   Last Height:   Ht Readings from Last 1 Encounters:  09/25/16 5\' 2"  (1.575 m)       Physical exam: Exam: Gen: NAD                   CV: RRR, no MRG. No Carotid Bruits. No peripheral edema, warm, nontender Eyes: Conjunctivae clear without exudates or hemorrhage  Neuro: Detailed Neurologic Exam  Speech:    Speech is normal; fluent and spontaneous with normal comprehension.  Cognition:    The patient is oriented to person, place, and time;     Cranial Nerves:    The pupils are equal, round, and reactive to light.  Visual fields are full to finger confrontation. Extraocular movements are intact. Trigeminal sensation is intact and the muscles of mastication are normal. Left lid ptosis(chronic), otherwise the  face is symmetric, not fatiguable. The palate elevates in the midline. Hearing intact. Voice is normal. Shoulder shrug is normal. The tongue has normal motion without fasciculations.   Coordination:    Normal finger to nose and heel  to shin.  Gait:   When she gets up independently she shuffles but if you hold her hands lightly  and walk very quickly up and down the hallway she can keep up with good stride, good turn and can keep up with a quick pace   Motor Observation:    No asymmetry, no atrophy, and no involuntary movements noted. No tremor on  exam.  Tone:    Normal muscle tone.    Posture:    Posture is normal. normal erect    Strength: giveway and  poor effort but overall appears to have 4/5 strength if you encourage her and ask her to give all her effort her strength improves.      Sensation: splits midline to pin prick and vibration     Reflex Exam:  DTR's:    Deep tendon reflexes in the upper and lower extremities are brisk bilaterally.   Toes:    The toes are downgoing bilaterally.   Clonus:    Clonus is absent.       Assessment/Plan:  50 y.o. female here as a follow up. New onset tremor and weakness. Past medical history of intractable migraine, prediabetes, depression, insomnia, anxiety, back pain, significant psychiatric stressors.. She has significant depression and anxiety and I have recommended psychiatric follow up in the past. She has been seen at [facility] for unilateral symptoms in the past by our stroke team and exam was consistent with nonorganic causes possibly stress. My exams have also not been consistent with non-organic cause, showing poor effort and give way on the right, splitting exactly midline with vibration and pinprick, appears to have significant psychiatric contribution to her symptoms. with a lot of embellishment.  -Highly recommend referral to psychiatry for depression and anxiety which I feel is significantly contributing to her symptoms. Referral placed at last appointment. -For her migraine: Continue Botox for multiple failed medications.  - continue treatment for cluster headaches as well.  New onset weakness and gait distrubance and tremor (no tremor on exam today): Feel this may be functional.  When she gets up independently she shuffles but if you hold her hands lightly  and walk very quickly up and down the hallway she can keep up with good stride, good turn and can keep up with a quick pace. Also no tremor on exam today.  Still need to rule out organic causes EMG/NCS, Labs today  including CK, Myasthenia gravis antibodies.   A total of 30 minutes was spent in with this patient face-to-face. Over half this time was spent on counseling patient on the new tremor and weakness diagnosis and different therapeutic options available.

## 2016-09-27 ENCOUNTER — Telehealth: Payer: Self-pay | Admitting: *Deleted

## 2016-09-27 NOTE — Telephone Encounter (Signed)
-----   Message from Melvenia Beam, MD sent at 09/27/2016 10:43 AM EDT ----- Labs within normal limits thanks

## 2016-09-27 NOTE — Telephone Encounter (Signed)
Called and spoke to patient about lab results per Dr Jaynee Eagles. Patient verbalized understanding.

## 2016-09-30 LAB — HEAVY METALS, BLOOD
ARSENIC: 5 ug/L (ref 2–23)
LEAD, BLOOD: 1 ug/dL (ref 0–19)
MERCURY: NOT DETECTED ug/L (ref 0.0–14.9)

## 2016-09-30 LAB — HEPATITIS C ANTIBODY: Hep C Virus Ab: <0.1 s/co ratio (ref 0.0–0.9)

## 2016-09-30 LAB — ACETYLCHOLINE RECEPTOR, BLOCKING: ACETYLCHOL BLOCK AB: 16 % (ref 0–25)

## 2016-09-30 LAB — RPR: RPR Ser Ql: NONREACTIVE

## 2016-09-30 LAB — SEDIMENTATION RATE: Sed Rate: 20 mm/hr (ref 0–40)

## 2016-09-30 LAB — ANA W/REFLEX: ANA: NEGATIVE

## 2016-09-30 LAB — ACETYLCHOLINE RECEPTOR, BINDING: ACHR BINDING AB, SERUM: 0.03 nmol/L (ref 0.00–0.24)

## 2016-09-30 LAB — CK: CK TOTAL: 55 U/L (ref 24–173)

## 2016-09-30 LAB — ACETYLCHOLINE RECEPTOR, MODULATING

## 2016-09-30 LAB — HIV ANTIBODY (ROUTINE TESTING W REFLEX): HIV Screen 4th Generation wRfx: NONREACTIVE

## 2016-09-30 LAB — MAGNESIUM: MAGNESIUM: 2.6 mg/dL — AB (ref 1.6–2.3)

## 2016-10-05 ENCOUNTER — Other Ambulatory Visit: Payer: Self-pay | Admitting: Family Medicine

## 2016-10-25 ENCOUNTER — Ambulatory Visit: Payer: BLUE CROSS/BLUE SHIELD | Admitting: Neurology

## 2016-10-29 ENCOUNTER — Other Ambulatory Visit: Payer: BLUE CROSS/BLUE SHIELD

## 2016-10-29 ENCOUNTER — Encounter: Payer: Self-pay | Admitting: Nurse Practitioner

## 2016-10-29 ENCOUNTER — Ambulatory Visit (INDEPENDENT_AMBULATORY_CARE_PROVIDER_SITE_OTHER): Payer: BLUE CROSS/BLUE SHIELD | Admitting: Nurse Practitioner

## 2016-10-29 ENCOUNTER — Ambulatory Visit (INDEPENDENT_AMBULATORY_CARE_PROVIDER_SITE_OTHER)
Admission: RE | Admit: 2016-10-29 | Discharge: 2016-10-29 | Disposition: A | Discharge status: Home / Self Care | Payer: BLUE CROSS/BLUE SHIELD | Source: Ambulatory Visit | Attending: Nurse Practitioner | Admitting: Nurse Practitioner

## 2016-10-29 ENCOUNTER — Telehealth: Payer: Self-pay

## 2016-10-29 VITALS — BP 142/96 | HR 86 | Temp 97.6°F | Ht 62.0 in | Wt 171.0 lb

## 2016-10-29 DIAGNOSIS — R300 Dysuria: Secondary | ICD-10-CM

## 2016-10-29 DIAGNOSIS — R310 Gross hematuria: Secondary | ICD-10-CM

## 2016-10-29 LAB — POCT URINALYSIS DIPSTICK
Bilirubin, UA: NEGATIVE
Glucose, UA: NEGATIVE
Ketones, UA: NEGATIVE
NITRITE UA: NEGATIVE
PH UA: 6
Spec Grav, UA: 1.02
UROBILINOGEN UA: 0.2

## 2016-10-29 MED ORDER — CIPROFLOXACIN HCL 500 MG PO TABS: 500.0000 mg | ORAL_TABLET | Freq: Two times a day (BID) | ORAL | 0 refills | Status: DC

## 2016-10-29 MED ORDER — PHENAZOPYRIDINE HCL 100 MG PO TABS: 100.0000 mg | ORAL_TABLET | Freq: Three times a day (TID) | ORAL | 0 refills | Status: DC | PRN

## 2016-10-29 NOTE — Telephone Encounter (Signed)
I spoke with Ashley [name] and she scheduled appt with Wilfred Lacy NP on 10/29/16 at 10:15.

## 2016-10-29 NOTE — Progress Notes (Signed)
Pre visit review using our clinic review tool, if applicable. No additional management support is needed unless otherwise documented below in the visit note. 

## 2016-10-29 NOTE — Progress Notes (Signed)
Subjective:  Patient ID: Ashley [name], female    DOB: [birth year]  Age: 50 y.o. MRN: [id number]  CC: Hematuria (pain while urinating,urgency of urine but voiding small amts and pink on tissue when wipes/ri)   Hematuria  This is a new problem. The current episode started in the past 7 days. The problem has been gradually worsening since onset. She describes the hematuria as gross hematuria. The hematuria occurs during the terminal portion of her urinary stream. She reports no clotting in her urine stream. The pain is moderate. She describes her urine color as light pink. Irritative symptoms include frequency. Irritative symptoms do not include nocturia. Obstructive symptoms include dribbling, incomplete emptying and a slower stream. Obstructive symptoms do not include an intermittent stream, straining or a weak stream. Associated symptoms include abdominal pain, dysuria, flank pain and hesitancy. Pertinent negatives include no chills, facial swelling, fever, genital pain, inability to urinate, nausea or vomiting. She is sexually active. Her past medical history is significant for tobacco use. There is no history of GU trauma, hypertension, kidney stones, recent infection or STDs.  s/p hysterectomy, denies any intercourse in last 2weeks.  Outpatient Medications Prior to Visit  Medication Sig Dispense Refill  . amitriptyline (ELAVIL) 50 MG tablet Take 1-1/2 tablet at dinner time (Patient taking differently: Take 75 mg by mouth daily after supper. ) 45 tablet 3  . aspirin EC 81 MG tablet Take 81 mg by mouth daily.    . clonazePAM (KLONOPIN) 1 MG tablet Take 1 tablet (1 mg total) by mouth 3 (three) times daily as needed for anxiety. 90 tablet 0  . dicyclomine (BENTYL) 10 MG capsule TAKE ONE CAPSULE BY MOUTH TWICE DAILY AS NEEDED FOR  CRAMPING  AND  ABDOMINAL  PAIN 180 capsule 1  . docusate sodium (COLACE) 100 MG capsule Take 100 mg by mouth at bedtime.    . famotidine (PEPCID) 20 MG tablet One at bedtime  30 tablet 11  . furosemide (LASIX) 40 MG tablet Take 1 tablet (40 mg total) by mouth daily. (Patient taking differently: Take 40 mg by mouth at bedtime. ) 30 tablet 6  . omeprazole (PRILOSEC) 20 MG capsule Take 1 capsule (20 mg total) by mouth 2 (two) times daily. 180 capsule 1  . predniSONE (DELTASONE) 10 MG tablet Take  4 each am x 2 days,   2 each am x 2 days,  1 each am x 2 days and stop 14 tablet 0  . promethazine (PHENERGAN) 25 MG tablet Take 1 tablet (25 mg total) by mouth every 6 (six) hours as needed for nausea or vomiting. (Patient taking differently: Take 25 mg by mouth 2 (two) times daily as needed (nausea from migraines). ) 30 tablet 0  . rizatriptan (MAXALT-MLT) 10 MG disintegrating tablet Take 1 tablet (10 mg total) by mouth as needed for migraine. May repeat in 2 hours if needed. Do not take more than 3 tablets in a week. (Patient taking differently: Take 10 mg by mouth See admin instructions. Take 1 tablet (10 mg) by mouth at onset of migraine headache, may repeat in 2 hours if needed. Do not take more than 3 tablets in a week.) 10 tablet 3  . Topiramate ER 25 MG CP24 Take 50 mg by mouth at bedtime. (Patient taking differently: Take 50 mg by mouth at bedtime. For migraines) 60 capsule 11  . traMADol (ULTRAM) 50 MG tablet 1-2 every 4 hours as needed for cough or pain 40 tablet 0  .  traZODone (DESYREL) 150 MG tablet Take 1 tablet (150 mg total) by mouth at bedtime. 90 tablet 1   No facility-administered medications prior to visit.     ROS See HPI  Objective:  BP (!) 142/96 (BP Location: Left Arm, Patient Position: Sitting, Cuff Size: Normal)   Pulse 86   Temp 97.6 F (36.4 C)   Ht 5\' 2"  (1.575 m)   Wt 171 lb (77.6 kg)   LMP 02/12/2014   SpO2 98%   BMI 31.28 kg/m   BP Readings from Last 3 Encounters:  10/29/16 (!) 142/96  09/25/16 127/77  09/20/16 110/70    Wt Readings from Last 3 Encounters:  10/29/16 171 lb (77.6 kg)  09/25/16 175 lb 12.8 oz (79.7 kg)  09/20/16  176 lb 8 oz (80.1 kg)    Physical Exam  Constitutional: She is oriented to person, place, and time. She appears distressed.  Cardiovascular: Normal rate.   Pulmonary/Chest: Effort normal.  Abdominal: Soft. Bowel sounds are normal. She exhibits no distension. There is tenderness.  Musculoskeletal: She exhibits no edema.  Neurological: She is alert and oriented to person, place, and time.  Vitals reviewed.   Lab Results  Component Value Date   WBC 6.9 08/17/2016   HGB 13.1 08/17/2016   HCT 40.3 08/17/2016   PLT 319 08/17/2016   GLUCOSE 136 (H) 08/17/2016   CHOL 187 10/10/2015   TRIG 119.0 10/10/2015   HDL 38.40 (L) 10/10/2015   LDLDIRECT 152.1 01/19/2013   LDLCALC 125 (H) 10/10/2015   ALT 15 07/25/2016   AST 15 07/25/2016   NA 139 08/17/2016   K 4.3 08/17/2016   CL 108 08/17/2016   CREATININE 0.98 08/17/2016   BUN 14 08/17/2016   CO2 24 08/17/2016   TSH 1.69 07/25/2016   INR 0.97 08/17/2016   HGBA1C 6.0 10/10/2015    Dg Knee Ap/lat W/sunrise Left  Result Date: 09/03/2016 CLINICAL DATA:  Knee pain after for fall some 1 month. EXAM: LEFT KNEE 3 VIEWS COMPARISON:  04/02/2016 FINDINGS: No evidence of fracture, dislocation, or joint effusion. No evidence of arthropathy or other focal bone abnormality. Soft tissues are unremarkable. IMPRESSION: Negative. Electronically Signed   By: Misty Stanley M.D.   On: 09/03/2016 14:44   Dg Knee Ap/lat W/sunrise Right  Result Date: 09/03/2016 CLINICAL DATA:  Knee pain, has fallen 4 times in a month EXAM: RIGHT KNEE 3 VIEWS COMPARISON:  None FINDINGS: Osseous mineralization normal. Medial compartment joint space narrowing. Joint spaces otherwise preserved No pleural effusion or pneumothorax. No joint effusion. IMPRESSION: Mild degenerative changes medial compartment RIGHT knee. No acute abnormalities. Electronically Signed   By: Lavonia Dana M.D.   On: 09/03/2016 14:32    Assessment & Plan:   Aldyn was seen today for hematuria.  Diagnoses  and all orders for this visit:  Dysuria -     Urine culture; Future -     POCT urinalysis dipstick -     DG Abd 1 View; Future -     phenazopyridine (PYRIDIUM) 100 MG tablet; Take 1 tablet (100 mg total) by mouth 3 (three) times daily as needed for pain (with food). -     ciprofloxacin (CIPRO) 500 MG tablet; Take 1 tablet (500 mg total) by mouth 2 (two) times daily.  Gross hematuria -     Urine culture; Future -     POCT urinalysis dipstick -     DG Abd 1 View; Future -     phenazopyridine (PYRIDIUM) 100 MG  tablet; Take 1 tablet (100 mg total) by mouth 3 (three) times daily as needed for pain (with food). -     ciprofloxacin (CIPRO) 500 MG tablet; Take 1 tablet (500 mg total) by mouth 2 (two) times daily.   I am having Ms. Trull start on phenazopyridine and ciprofloxacin. I am also having her maintain her rizatriptan, promethazine, Topiramate ER, amitriptyline, traZODone, furosemide, aspirin EC, docusate sodium, predniSONE, famotidine, traMADol, clonazePAM, omeprazole, and dicyclomine.  Meds ordered this encounter  Medications  . phenazopyridine (PYRIDIUM) 100 MG tablet    Sig: Take 1 tablet (100 mg total) by mouth 3 (three) times daily as needed for pain (with food).    Dispense:  10 tablet    Refill:  0    Order Specific Question:   Supervising Provider    Answer:   Cassandria Anger [[id number]]  . ciprofloxacin (CIPRO) 500 MG tablet    Sig: Take 1 tablet (500 mg total) by mouth 2 (two) times daily.    Dispense:  14 tablet    Refill:  0    Order Specific Question:   Supervising Provider    Answer:   Cassandria Anger [[id number]]    Follow-up: Return if symptoms worsen or fail to improve.  Wilfred Lacy, NP

## 2016-10-29 NOTE — Patient Instructions (Addendum)
Encourage adequate oral hydration.  You will be called with lab and x-ray results.  Return to office if no improvement in 1week.

## 2016-10-29 NOTE — Progress Notes (Signed)
Normal results No kidney stone. Complete oral abx as prescribed.

## 2016-10-30 LAB — URINE CULTURE

## 2016-11-01 ENCOUNTER — Ambulatory Visit (INDEPENDENT_AMBULATORY_CARE_PROVIDER_SITE_OTHER): Payer: BLUE CROSS/BLUE SHIELD | Admitting: Neurology

## 2016-11-01 ENCOUNTER — Ambulatory Visit (INDEPENDENT_AMBULATORY_CARE_PROVIDER_SITE_OTHER): Payer: Self-pay | Admitting: Neurology

## 2016-11-01 DIAGNOSIS — W19XXXD Unspecified fall, subsequent encounter: Secondary | ICD-10-CM

## 2016-11-01 DIAGNOSIS — R2689 Other abnormalities of gait and mobility: Secondary | ICD-10-CM | POA: Diagnosis not present

## 2016-11-01 DIAGNOSIS — R531 Weakness: Secondary | ICD-10-CM

## 2016-11-01 DIAGNOSIS — Z0289 Encounter for other administrative examinations: Secondary | ICD-10-CM

## 2016-11-01 DIAGNOSIS — R2990 Unspecified symptoms and signs involving the nervous system: Secondary | ICD-10-CM

## 2016-11-01 DIAGNOSIS — R269 Unspecified abnormalities of gait and mobility: Secondary | ICD-10-CM

## 2016-11-01 NOTE — Progress Notes (Signed)
[facility]  Provider:  Dr Jaynee Eagles Referring Provider: Lucille Passy, MD Primary Care Physician:  Arnette Norris, MD  History:  RANIJAH SNYDERS is a 50 y.o. female here as a referral for evaluation of muscle weakness and gait disturbance.  Summary: All nerve conductions normal as per tables below   EMG examination: The right Deltoid, right Triceps, right Pronator Teres, right Opponens Pollicis, right First Dorsal interosseous, right Vastus Medialis, right Gluteus Medius and Maximus, right Biceps Femoris(long head), right Anterior Tibialis, right Medial Gastrocnemius, right Extensor Hallucis Longus muscles, right lower lumbar paraspinals, right lower cervical paraspinals and right T10 paraspinal muscles were within normal limits.  Assessment/Plan:  This is a normal study. No electrophysiologic evidence for mononeuropathy, polyneuropathy, radiculopathy or muscle disorder.   [place]    Nerve / Sites Rec. Site Peak Lat Ref.  Amp Ref. Segments Distance    ms ms V V  cm  R Sural - Ankle (Calf)     Calf Ankle 3.6 ?4.4 17 ?6 Calf - Ankle 14  R Superficial peroneal - Ankle     Lat leg Ankle 3.1 ?4.4 17 ?6 Lat leg - Ankle 14  R Median - Orthodromic (Dig II, Mid palm)     Dig II Wrist 2.6 ?3.4 26 ?10 Dig II - Wrist 13  R Ulnar - Orthodromic, (Dig V, Mid palm)     Dig V Wrist 2.9 ?3.1 12 ?5 Dig V - Wrist 11  R Radial - Anatomical snuff box (Forearm)     Forearm Wrist 2.2 ?2.9 21 ?15 Forearm - Wrist 10               MNC    Nerve / Sites Muscle Latency Ref. Amplitude Ref. Rel Amp Segments Distance Lat Diff Velocity Ref. Area    ms ms mV mV %  cm ms m/s m/s mVms  R Peroneal - EDB     Ankle EDB 6.1 ?6.5 2.0 ?2.0 100 Ankle - EDB 9    13.2     Fib head EDB 12.2  2.0  95.3 Fib head - Ankle 27 6.1 44 ?44 11.4     Pop fossa EDB 14.4  2.0  115 Pop fossa - Fib head 10 2.2 46 ?44 13.1         Pop fossa - Ankle 0 8.3 0    R Tibial - AH     Ankle AH 3.5 ?5.8 18.1 ?4.0 100 Ankle - AH 9     47.1     Pop fossa AH 11.6  14.1  77.8 Pop fossa - Ankle 34 8.1 42 ?41 39.8  R Median - APB     Wrist APB 3.0 ?4.4 6.4 ?4.0 100 Wrist - APB 7    21.7     Upper arm APB 6.7  4.7  73.6 Upper arm - Wrist 20 3.6 55  21.2         Axilla - Wrist       R Ulnar - ADM     Wrist ADM 2.9 ?3.3 10.8 ?6.0 100 Wrist - ADM 7    28.8     B.Elbow ADM 5.5  10.3  94.7 B.Elbow - Wrist 15 2.6 59 ?49 26.7     A.Elbow ADM 7.2  9.8  95.1 A.Elbow - B.Elbow 10 1.8 56 ?49 25.4         A.Elbow - Wrist  4.3  F  Wave    Nerve F Lat Ref.   ms ms  R Peroneal - EDB 45.3 ?56.0  R Tibial - AH 46.6 ?56.0  R Ulnar - ADM 24.9 ?32.0           H Reflex    Nerve H Lat Lat Hmax   ms ms   Left Right Ref. Left Right Ref.  Tibial - Soleus  34.7 ?35.0  28.9 ?35.0        Cc: Dr. Antony Haste, MD  [facility] [address] [place], [place] [address]  Phone [phone] Fax [phone]

## 2016-11-06 NOTE — Procedures (Signed)
[facility]  Provider:  Dr Jaynee Eagles Referring Provider: Lucille Passy, MD Primary Care Physician:  Arnette Norris, MD  History:  Ashley [name] is a 50 y.o. female here as a referral for evaluation of new onset muscle weakness and gait disturbance.  Summary: All nerve conductions normal as per tables below   EMG examination: The right Deltoid, right Triceps, right Pronator Teres, right Opponens Pollicis, right First Dorsal interosseous, right Vastus Medialis, right Gluteus Medius and Maximus, right Biceps Femoris(long head), right Anterior Tibialis, right Medial Gastrocnemius, right Extensor Hallucis Longus muscles, right lower lumbar paraspinals, right lower cervical paraspinals and right T10 paraspinal muscles were within normal limits.  Assessment/Plan:  This is a normal study. No electrophysiologic evidence for mononeuropathy, polyneuropathy, radiculopathy or muscle disorder.   [place]    Nerve / Sites Rec. Site Peak Lat Ref.  Amp Ref. Segments Distance    ms ms V V  cm  R Sural - Ankle (Calf)     Calf Ankle 3.6 ?4.4 17 ?6 Calf - Ankle 14  R Superficial peroneal - Ankle     Lat leg Ankle 3.1 ?4.4 17 ?6 Lat leg - Ankle 14  R Median - Orthodromic (Dig II, Mid palm)     Dig II Wrist 2.6 ?3.4 26 ?10 Dig II - Wrist 13  R Ulnar - Orthodromic, (Dig V, Mid palm)     Dig V Wrist 2.9 ?3.1 12 ?5 Dig V - Wrist 11  R Radial - Anatomical snuff box (Forearm)     Forearm Wrist 2.2 ?2.9 21 ?15 Forearm - Wrist 10               MNC    Nerve / Sites Muscle Latency Ref. Amplitude Ref. Rel Amp Segments Distance Lat Diff Velocity Ref. Area    ms ms mV mV %  cm ms m/s m/s mVms  R Peroneal - EDB     Ankle EDB 6.1 ?6.5 2.0 ?2.0 100 Ankle - EDB 9    13.2     Fib head EDB 12.2  2.0  95.3 Fib head - Ankle 27 6.1 44 ?44 11.4     Pop fossa EDB 14.4  2.0  115 Pop fossa - Fib head 10 2.2 46 ?44 13.1         Pop fossa - Ankle 0 8.3 0    R Tibial - AH     Ankle AH 3.5 ?5.8 18.1 ?4.0 100 Ankle - AH 9     47.1     Pop fossa AH 11.6  14.1  77.8 Pop fossa - Ankle 34 8.1 42 ?41 39.8  R Median - APB     Wrist APB 3.0 ?4.4 6.4 ?4.0 100 Wrist - APB 7    21.7     Upper arm APB 6.7  4.7  73.6 Upper arm - Wrist 20 3.6 55  21.2         Axilla - Wrist       R Ulnar - ADM     Wrist ADM 2.9 ?3.3 10.8 ?6.0 100 Wrist - ADM 7    28.8     B.Elbow ADM 5.5  10.3  94.7 B.Elbow - Wrist 15 2.6 59 ?49 26.7     A.Elbow ADM 7.2  9.8  95.1 A.Elbow - B.Elbow 10 1.8 56 ?49 25.4         A.Elbow - Wrist  4.3  F  Wave    Nerve F Lat Ref.   ms ms  R Peroneal - EDB 45.3 ?56.0  R Tibial - AH 46.6 ?56.0  R Ulnar - ADM 24.9 ?32.0           H Reflex    Nerve H Lat Lat Hmax   ms ms   Left Right Ref. Left Right Ref.  Tibial - Soleus  34.7 ?35.0  28.9 ?35.0        Cc: Dr. Antony Haste, MD  [facility] [address] [place], [place] [address]  Phone [phone] Fax [phone]

## 2016-11-06 NOTE — Progress Notes (Signed)
See procedure note.

## 2016-11-12 ENCOUNTER — Telehealth: Payer: Self-pay | Admitting: Neurology

## 2016-11-12 NOTE — Telephone Encounter (Signed)
Pt called back in after speaking with her pharmacy to give consent to ship medication. She wanted to make sure Danielle knew.

## 2016-11-13 ENCOUNTER — Ambulatory Visit (INDEPENDENT_AMBULATORY_CARE_PROVIDER_SITE_OTHER): Payer: BLUE CROSS/BLUE SHIELD | Admitting: Neurology

## 2016-11-13 ENCOUNTER — Telehealth: Payer: Self-pay | Admitting: Neurology

## 2016-11-13 ENCOUNTER — Encounter: Payer: Self-pay | Admitting: Neurology

## 2016-11-13 VITALS — BP 150/87 | HR 112 | Resp 20 | Ht 62.0 in | Wt 169.0 lb

## 2016-11-13 DIAGNOSIS — G43711 Chronic migraine without aura, intractable, with status migrainosus: Secondary | ICD-10-CM

## 2016-11-13 NOTE — Telephone Encounter (Signed)
DR [facility] PT SEEN FOR BOTOX TODAY NEEDS 12 WK FU BOTOX INJ SCHEDULED. CB

## 2016-11-13 NOTE — Progress Notes (Signed)

## 2016-11-13 NOTE — Progress Notes (Signed)
**  Botox 100 units x 2 vials, Lot [id number], Exp 05/2019, NDC 0023-1145-01, specialty pharmacy.//mck,rn** 

## 2016-11-22 ENCOUNTER — Ambulatory Visit: Payer: BLUE CROSS/BLUE SHIELD | Attending: Neurology

## 2016-12-04 ENCOUNTER — Ambulatory Visit (INDEPENDENT_AMBULATORY_CARE_PROVIDER_SITE_OTHER): Payer: BLUE CROSS/BLUE SHIELD | Admitting: Internal Medicine

## 2016-12-04 ENCOUNTER — Encounter: Payer: Self-pay | Admitting: Internal Medicine

## 2016-12-04 VITALS — BP 128/84 | HR 77 | Temp 97.9°F | Wt 171.0 lb

## 2016-12-04 DIAGNOSIS — J0100 Acute maxillary sinusitis, unspecified: Secondary | ICD-10-CM | POA: Diagnosis not present

## 2016-12-04 DIAGNOSIS — E119 Type 2 diabetes mellitus without complications: Secondary | ICD-10-CM | POA: Diagnosis not present

## 2016-12-04 MED ORDER — DOXYCYCLINE HYCLATE 100 MG PO TABS: 100.0000 mg | ORAL_TABLET | Freq: Two times a day (BID) | ORAL | 0 refills | Status: DC

## 2016-12-04 NOTE — Progress Notes (Signed)
HPI  Pt presents to the clinic today with c/o facial pain and pressure, nasal congestion, scratchy throat and cough. This started 1 week ago. She is not blowing anything out of her nose. The cough is productive of yellow/green mucous. She denies fever, chills or body aches. She has tried Delsym with minimal relief. She has no history of allergies or breathing problems. She has had sick contacts. She is UTD on her flu and pneumonia vaccines. She does have DM 2, last A1C was 6%, but she does not take any medications for this. She does smoke.  Review of Systems     Past Medical History:  Diagnosis Date  . Anxiety   . Bronchitis    uses inhaler if needed for bronchitis, lasted used -2014  . Cataract    bilateral  . Chronic headaches    migraines - in past, uses Phenergan for nausea   . Diabetes mellitus without complication ([place])    taken off Metformin since 04/2014, HgbA1C - normal, will follow up with PCP- Dr. Deborra Medina, 07/2014  . GERD (gastroesophageal reflux disease)   . H/O exercise stress test 2011   done at [facility]- told that it was WNL, done due to pt. having panic attacks   . History of blood transfusion 03-21-1966   at birth in [place], [place], unsure number of units  . Poor dentition    very poor oral health   . SVD (spontaneous vaginal delivery)    x 3  . Tobacco abuse     Family History  Problem Relation Age of Onset  . Diabetes Mother   . Hyperlipidemia Mother   . Hypertension Mother   . Prostate cancer Father   . Lung cancer Maternal Grandfather     smoked  . Emphysema Maternal Grandfather     smoked  . COPD Maternal Grandmother     never smoked   . Thyroid cancer Paternal Aunt   . Colon cancer Neg Hx   . Rectal cancer Neg Hx   . Stomach cancer Neg Hx   . Migraines Neg Hx   . Dementia Neg Hx     Social History   Social History  . Marital status: Married    Spouse name: Alvester Chou  . Number of children: 6  . Years of education: 14   Occupational History  .        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 Smoking status: Current Every Day Smoker    Packs/day: 1.00    Years: 35.00    Types: Cigarettes, E-cigarettes  . Smokeless tobacco: Never Used  . Alcohol use No  . Drug use: No  . Sexual activity: Yes    Birth control/ protection: Pill   Other Topics Concern  . Not on file   Social History Narrative   5 living children, one child is a crack cocaine addict who often breaks into their house to steal money. One child died of cerebral palsy.   Caffeine use: Dr Malachi Bonds (3 per day)   2 cups coffee per day    Allergies  Allergen Reactions  . Penicillins Anaphylaxis    Throat swells, Has patient had a PCN reaction causing immediate rash, facial/tongue/throat swelling, SOB or lightheadedness with hypotension: Yes Has patient had a PCN reaction causing severe rash involving mucus membranes or skin necrosis: No Has patient had a PCN reaction that required hospitalization No Has patient had a PCN reaction occurring within the last  10 years: No If all of the above answers are "NO", then may proceed with Cephalosporin use.   . Topamax [Topiramate] Other (See Comments)    Hands and feet to go numb     Constitutional: Positive headache, fatigue. Denies fever or abrupt weight changes.  HEENT:  Positive facial pain, nasal congestion and sore throat. Denies eye redness, ear pain, ringing in the ears, wax buildup, runny nose or bloody nose. Respiratory: Positive cough. Denies difficulty breathing or shortness of breath.  Cardiovascular: Denies chest pain, chest tightness, palpitations or swelling in the hands or feet.   No other specific complaints in a complete review of systems (except as listed in HPI above).  Objective:   BP 128/84   Pulse 77   Temp 97.9 F (36.6 C) (Oral)   Wt 171 lb (77.6 kg)   LMP 02/12/2014   SpO2 97%   BMI 31.28 kg/m   General: Appears her stated age, ill appearing, in NAD. HEENT: Head:  normal shape and size, maxillary sinus tenderness noted; Eyes: sclera white, no icterus, conjunctiva pink; Right Ear: Cerumen impaction; Left Ear: Tm's gray and intact, normal light reflex; Nose: mucosa boggy and moist, septum midline, + green mucous noted; Throat/Mouth: + PND. Teeth present, mucosa erythematous and moist, no exudate noted, no lesions or ulcerations noted.  Neck:  No adenopathy noted.  Cardiovascular: Normal rate and rhythm. S1,S2 noted.  No murmur, rubs or gallops noted.  Pulmonary/Chest: Normal effort and positive vesicular breath sounds. No respiratory distress. No wheezes, rales or ronchi noted.       Assessment & Plan:   Acute bacterial sinusitis  Can use a Neti Pot which can be purchased from your local drug store. Flonase 2 sprays each nostril for 3 days and then as needed. eRx for Doxycycline BID for 10 days  RTC as needed or if symptoms persist. Webb Silversmith, NP

## 2016-12-04 NOTE — Patient Instructions (Signed)

## 2016-12-13 ENCOUNTER — Other Ambulatory Visit: Payer: Self-pay | Admitting: Family Medicine

## 2016-12-13 DIAGNOSIS — Z01419 Encounter for gynecological examination (general) (routine) without abnormal findings: Secondary | ICD-10-CM | POA: Insufficient documentation

## 2016-12-19 ENCOUNTER — Other Ambulatory Visit (INDEPENDENT_AMBULATORY_CARE_PROVIDER_SITE_OTHER): Payer: BLUE CROSS/BLUE SHIELD

## 2016-12-19 DIAGNOSIS — Z01419 Encounter for gynecological examination (general) (routine) without abnormal findings: Secondary | ICD-10-CM | POA: Diagnosis not present

## 2016-12-19 LAB — CBC WITH DIFFERENTIAL/PLATELET
BASOS ABS: 0 10*3/uL (ref 0.0–0.1)
Basophils Relative: 0.4 % (ref 0.0–3.0)
EOS ABS: 0.1 10*3/uL (ref 0.0–0.7)
Eosinophils Relative: 2 % (ref 0.0–5.0)
HCT: 38.4 % (ref 36.0–46.0)
Hemoglobin: 13.2 g/dL (ref 12.0–15.0)
LYMPHS ABS: 2.8 10*3/uL (ref 0.7–4.0)
Lymphocytes Relative: 39.4 % (ref 12.0–46.0)
MCHC: 34.3 g/dL (ref 30.0–36.0)
MCV: 92.3 fl (ref 78.0–100.0)
MONO ABS: 0.6 10*3/uL (ref 0.1–1.0)
MONOS PCT: 7.7 % (ref 3.0–12.0)
NEUTROS PCT: 50.5 % (ref 43.0–77.0)
Neutro Abs: 3.6 10*3/uL (ref 1.4–7.7)
Platelets: 392 10*3/uL (ref 150.0–400.0)
RBC: 4.16 Mil/uL (ref 3.87–5.11)
RDW: 14.1 % (ref 11.5–15.5)
WBC: 7.2 10*3/uL (ref 4.0–10.5)

## 2016-12-19 LAB — LIPID PANEL
CHOL/HDL RATIO: 4
Cholesterol: 166 mg/dL (ref 0–200)
HDL: 38.8 mg/dL — ABNORMAL LOW (ref >39.00)
LDL Cholesterol: 104 mg/dL — ABNORMAL HIGH (ref 0–99)
NONHDL: 127.58
Triglycerides: 116 mg/dL (ref 0.0–149.0)
VLDL: 23.2 mg/dL (ref 0.0–40.0)

## 2016-12-19 LAB — TSH: TSH: 1.35 u[IU]/mL (ref 0.35–4.50)

## 2016-12-19 LAB — COMPREHENSIVE METABOLIC PANEL
ALK PHOS: 88 U/L (ref 39–117)
ALT: 13 U/L (ref 0–35)
AST: 11 U/L (ref 0–37)
Albumin: 3.9 g/dL (ref 3.5–5.2)
BILIRUBIN TOTAL: 0.3 mg/dL (ref 0.2–1.2)
BUN: 8 mg/dL (ref 6–23)
CO2: 27 meq/L (ref 19–32)
Calcium: 9.1 mg/dL (ref 8.4–10.5)
Chloride: 106 mEq/L (ref 96–112)
Creatinine, Ser: 0.79 mg/dL (ref 0.40–1.20)
GFR: 81.57 mL/min (ref >60.00)
GLUCOSE: 176 mg/dL — AB (ref 70–99)
Potassium: 4.4 mEq/L (ref 3.5–5.1)
SODIUM: 141 meq/L (ref 135–145)
TOTAL PROTEIN: 6.7 g/dL (ref 6.0–8.3)

## 2016-12-26 ENCOUNTER — Ambulatory Visit (INDEPENDENT_AMBULATORY_CARE_PROVIDER_SITE_OTHER): Payer: BLUE CROSS/BLUE SHIELD | Admitting: Family Medicine

## 2016-12-26 ENCOUNTER — Encounter: Payer: Self-pay | Admitting: Family Medicine

## 2016-12-26 VITALS — BP 122/70 | HR 76 | Temp 98.1°F | Ht 62.0 in | Wt 166.5 lb

## 2016-12-26 DIAGNOSIS — E119 Type 2 diabetes mellitus without complications: Secondary | ICD-10-CM | POA: Diagnosis not present

## 2016-12-26 DIAGNOSIS — F411 Generalized anxiety disorder: Secondary | ICD-10-CM

## 2016-12-26 DIAGNOSIS — G43C0 Periodic headache syndromes in child or adult, not intractable: Secondary | ICD-10-CM

## 2016-12-26 DIAGNOSIS — G4700 Insomnia, unspecified: Secondary | ICD-10-CM

## 2016-12-26 DIAGNOSIS — Z01419 Encounter for gynecological examination (general) (routine) without abnormal findings: Secondary | ICD-10-CM | POA: Diagnosis not present

## 2016-12-26 DIAGNOSIS — E785 Hyperlipidemia, unspecified: Secondary | ICD-10-CM | POA: Diagnosis not present

## 2016-12-26 LAB — MICROALBUMIN / CREATININE URINE RATIO
CREATININE, U: 85.5 mg/dL
Microalb Creat Ratio: 0.8 mg/g (ref 0.0–30.0)

## 2016-12-26 LAB — HEMOGLOBIN A1C: Hgb A1c MFr Bld: 6.4 % (ref 4.6–6.5)

## 2016-12-26 MED ORDER — KETOROLAC TROMETHAMINE 60 MG/2ML IM SOLN
60.0000 mg | Freq: Once | INTRAMUSCULAR | Status: AC
  Administered 2016-12-26: 60 mg via INTRAMUSCULAR

## 2016-12-26 MED ORDER — TRAZODONE HCL 50 MG PO TABS: 150.0000 mg | ORAL_TABLET | Freq: Every evening | ORAL | 3 refills | Status: DC | PRN

## 2016-12-26 MED ORDER — CLONAZEPAM 1 MG PO TABS: 1.0000 mg | ORAL_TABLET | Freq: Three times a day (TID) | ORAL | 0 refills | Status: DC | PRN

## 2016-12-26 MED ORDER — PROMETHAZINE HCL 25 MG PO TABS: 25.0000 mg | ORAL_TABLET | Freq: Four times a day (QID) | ORAL | 0 refills | Status: DC | PRN

## 2016-12-26 NOTE — Patient Instructions (Signed)
Great to see you. Please keep me updated.  Schedule your mammogram.

## 2016-12-26 NOTE — Assessment & Plan Note (Signed)
Reviewed preventive care protocols, scheduled due services, and updated immunizations Discussed nutrition, exercise, diet, and healthy lifestyle.  

## 2016-12-26 NOTE — Assessment & Plan Note (Signed)
Deteriorated. IM toradol given today and I advised that she restart Amitriptyline. The patient indicates understanding of these issues and agrees with the plan.

## 2016-12-26 NOTE — Addendum Note (Signed)
Addended by: Lucille Passy on: 12/26/2016 01:05 PM   Modules accepted: Orders

## 2016-12-26 NOTE — Progress Notes (Signed)
Subjective:   Patient ID: Ashley [name], female    DOB: [birth year], 51 y.o.   MRN: [id number]  Ashley [name] is a pleasant 51 y.o. year old female who presents to clinic today with Annual Exam and Migraine (x3d)  on 12/26/2016  HPI:  Remote h/o hysterectomy Colonoscopy 02/16/14  Mammogram 05/03/15  Has been followed by neurology- Dr. Jaynee Eagles, for abnormal gait and migraines- last seen on 11/13/16. Notes reviewed. Receving botox injections which have not been helping as much as she had hoped. Currently has a migraine. No longer taking Topamax.  Has a migraine today.  Just took maxalt. No longer taking amitriptyline.  H/o DM- has been diet controlled. Lab Results  Component Value Date   HGBA1C 6.0 10/10/2015   Lab Results  Component Value Date   CHOL 166 12/19/2016   HDL 38.80 (L) 12/19/2016   LDLCALC 104 (H) 12/19/2016   LDLDIRECT 152.1 01/19/2013   TRIG 116.0 12/19/2016   CHOLHDL 4 12/19/2016   Lab Results  Component Value Date   NA 141 12/19/2016   K 4.4 12/19/2016   CL 106 12/19/2016   CO2 27 12/19/2016   Lab Results  Component Value Date   TSH 1.35 12/19/2016   Lab Results  Component Value Date   WBC 7.2 12/19/2016   HGB 13.2 12/19/2016   HCT 38.4 12/19/2016   MCV 92.3 12/19/2016   PLT 392.0 12/19/2016   Current Outpatient Prescriptions on File Prior to Visit  Medication Sig Dispense Refill  . aspirin EC 81 MG tablet Take 81 mg by mouth daily.    Marland Kitchen amitriptyline (ELAVIL) 50 MG tablet Take 1-1/2 tablet at dinner time (Patient not taking: Reported on 12/26/2016) 45 tablet 3  . dicyclomine (BENTYL) 10 MG capsule TAKE ONE CAPSULE BY MOUTH TWICE DAILY AS NEEDED FOR  CRAMPING  AND  ABDOMINAL  PAIN (Patient not taking: Reported on 12/26/2016) 180 capsule 1  . docusate sodium (COLACE) 100 MG capsule Take 100 mg by mouth at bedtime.    . famotidine (PEPCID) 20 MG tablet One at bedtime (Patient not taking: Reported on 12/26/2016) 30 tablet 11  . furosemide (LASIX) 40 MG tablet  Take 1 tablet (40 mg total) by mouth daily. (Patient taking differently: Take 40 mg by mouth at bedtime. ) 30 tablet 6  . omeprazole (PRILOSEC) 20 MG capsule Take 1 capsule (20 mg total) by mouth 2 (two) times daily. (Patient not taking: Reported on 12/26/2016) 180 capsule 1  . rizatriptan (MAXALT-MLT) 10 MG disintegrating tablet Take 1 tablet (10 mg total) by mouth as needed for migraine. May repeat in 2 hours if needed. Do not take more than 3 tablets in a week. (Patient not taking: Reported on 12/26/2016) 10 tablet 3  . Topiramate ER 25 MG CP24 Take 50 mg by mouth at bedtime. (Patient not taking: Reported on 12/26/2016) 60 capsule 11  . traMADol (ULTRAM) 50 MG tablet 1-2 every 4 hours as needed for cough or pain (Patient not taking: Reported on 12/26/2016) 40 tablet 0   No current facility-administered medications on file prior to visit.     Allergies  Allergen Reactions  . Penicillins Anaphylaxis    Throat swells, Has patient had a PCN reaction causing immediate rash, facial/tongue/throat swelling, SOB or lightheadedness with hypotension: Yes Has patient had a PCN reaction causing severe rash involving mucus membranes or skin necrosis: No Has patient had a PCN reaction that required hospitalization No Has patient had a PCN reaction occurring within the  last 10 years: No If all of the above answers are "NO", then may proceed with Cephalosporin use.   . Topamax [Topiramate] Other (See Comments)    Hands and feet to go numb    Past Medical History:  Diagnosis Date  . Anxiety   . Bronchitis    uses inhaler if needed for bronchitis, lasted used -2014  . Cataract    bilateral  . Chronic headaches    migraines - in past, uses Phenergan for nausea   . Diabetes mellitus without complication ([place])    taken off Metformin since 04/2014, HgbA1C - normal, will follow up with PCP- Dr. Deborra Medina, 07/2014  . GERD (gastroesophageal reflux disease)   . H/O exercise stress test 2011   done at [facility]-  told that it was WNL, done due to pt. having panic attacks   . History of blood transfusion 28-[birth year]-1966   at birth in [place], [place], unsure number of units  . Poor dentition    very poor oral health   . SVD (spontaneous vaginal delivery)    x 3  . Tobacco abuse     Past Surgical History:  Procedure Laterality Date  . ABDOMINAL HYSTERECTOMY    . APPENDECTOMY  1988  . CARDIAC CATHETERIZATION N/A 08/17/2016   Procedure: Left Heart Cath and Coronary Angiography;  Surgeon: Burnell Blanks, MD;  Location: [facility];  Service: Cardiovascular;  Laterality: N/A;  . CHOLECYSTECTOMY N/A 06/07/2014   Procedure: LAPAROSCOPIC CHOLECYSTECTOMY;  Surgeon: Ralene Ok, MD;  Location: [facility];  Service: General;  Laterality: N/A;  . CHOLECYSTECTOMY  June 07 2014  . COLONOSCOPY  02/16/2014   normal   . ESOPHAGOGASTRODUODENOSCOPY  12/28/2013  . KNEE ARTHROSCOPY  1995   left  . LAPAROSCOPIC ASSISTED VAGINAL HYSTERECTOMY N/A 04/15/2014   Procedure: LAPAROSCOPIC ASSISTED VAGINAL HYSTERECTOMY;  Surgeon: Marylynn Pearson, MD;  Location: [facility];  Service: Gynecology;  Laterality: N/A;  . LAPAROSCOPIC BILATERAL SALPINGO OOPHERECTOMY Bilateral 04/15/2014   Procedure: LAPAROSCOPIC BILATERAL SALPINGO OOPHORECTOMY;  Surgeon: Marylynn Pearson, MD;  Location: [facility];  Service: Gynecology;  Laterality: Bilateral;  . TUBAL LIGATION      Family History  Problem Relation Age of Onset  . Diabetes Mother   . Hyperlipidemia Mother   . Hypertension Mother   . Prostate cancer Father   . Lung cancer Maternal Grandfather     smoked  . Emphysema Maternal Grandfather     smoked  . COPD Maternal Grandmother     never smoked   . Thyroid cancer Paternal Aunt   . Colon cancer Neg Hx   . Rectal cancer Neg Hx   . Stomach cancer Neg Hx   . Migraines Neg Hx   . Dementia Neg Hx     Social History   Social History  . Marital status: Married    Spouse name: Alvester Chou  . Number of children: 6  . Years of education: 11    Occupational History  .       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 Smoking status: Current Every Day Smoker    Packs/day: 1.00    Years: 35.00    Types: Cigarettes, E-cigarettes  . Smokeless tobacco: Never Used  . Alcohol use No  . Drug use: No  . Sexual activity: Yes    Birth control/ protection: Pill   Other Topics Concern  . Not on file   Social History Narrative   5 living children,  one child is a crack cocaine addict who often breaks into their house to steal money. One child died of cerebral palsy.   Caffeine use: Dr Malachi Bonds (3 per day)   2 cups coffee per day   The PMH, PSH, Social History, Family History, Medications, and allergies have been reviewed in [facility], and have been updated if relevant.    Review of Systems  Constitutional: Negative.   HENT: Negative.   Respiratory: Negative.   Cardiovascular: Negative.   Gastrointestinal: Negative.   Genitourinary: Negative.   Musculoskeletal: Negative.   Neurological: Positive for headaches. Negative for dizziness and facial asymmetry.  Hematological: Negative.   Psychiatric/Behavioral: Negative.   All other systems reviewed and are negative.      Objective:    BP 122/70   Pulse 76   Temp 98.1 F (36.7 C) (Oral)   Ht 5\' 2"  (1.575 m)   Wt 166 lb 8 oz (75.5 kg)   LMP 02/12/2014   SpO2 97%   BMI 30.45 kg/m    Physical Exam   General:  Well-developed,well-nourished,in no acute distress; alert,appropriate and cooperative throughout examination Head:  normocephalic and atraumatic.   Eyes:  vision grossly intact, PERRL Ears:  R ear normal and L ear normal externally, TMs clear bilaterally Nose:  no external deformity.   Mouth:  good dentition.   Neck:  No deformities, masses, or tenderness noted. Breasts:  No mass, nodules, thickening, tenderness, bulging, retraction, inflamation, nipple discharge or skin changes noted.   Lungs:  Normal respiratory effort, chest expands  symmetrically. Lungs are clear to auscultation, no crackles or wheezes. Heart:  Normal rate and regular rhythm. S1 and S2 normal without gallop, murmur, click, rub or other extra sounds. Abdomen:  Bowel sounds positive,abdomen soft and non-tender without masses, organomegaly or hernias noted. Rectal:  no external abnormalities.   Genitalia:  Pelvic Exam:        External: normal female genitalia without lesions or masses        Vagina: normal without lesions or masses        Cervix: absent        Adnexa: normal bimanual exam without masses or fullness        Uterus: absent   Msk:  No deformity or scoliosis noted of thoracic or lumbar spine.   Extremities:  No clubbing, cyanosis, edema, or deformity noted with normal full range of motion of all joints.   Neurologic:  alert & oriented X3 and gait normal.   Skin:  Intact without suspicious lesions or rashes Cervical Nodes:  No lymphadenopathy noted Axillary Nodes:  No palpable lymphadenopathy Psych:  Cognition and judgment appear intact. Alert and cooperative with normal attention span and concentration. No apparent delusions, illusions, hallucinations       Assessment & Plan:   Well woman exam  Anxiety state  INSOMNIA, CHRONIC  Periodic headache syndrome, not intractable  Hyperlipidemia, unspecified hyperlipidemia type  Type 2 diabetes mellitus without complication, without long-term current use of insulin (HCC) - Plan: Hemoglobin A1c, Microalbumin / creatinine urine ratio No Follow-up on file.

## 2016-12-26 NOTE — Assessment & Plan Note (Signed)
Has been diet controlled. Check a1c and urine micro today.

## 2016-12-28 ENCOUNTER — Other Ambulatory Visit: Payer: Self-pay | Admitting: Family Medicine

## 2016-12-28 MED ORDER — METFORMIN HCL 500 MG PO TABS: 500.0000 mg | ORAL_TABLET | Freq: Every day | ORAL | 3 refills | Status: DC

## 2017-01-02 ENCOUNTER — Encounter: Payer: Self-pay | Admitting: Family Medicine

## 2017-01-02 ENCOUNTER — Ambulatory Visit (INDEPENDENT_AMBULATORY_CARE_PROVIDER_SITE_OTHER): Payer: BLUE CROSS/BLUE SHIELD | Admitting: Family Medicine

## 2017-01-02 VITALS — BP 110/68 | HR 76 | Temp 98.4°F | Wt 163.5 lb

## 2017-01-02 DIAGNOSIS — J069 Acute upper respiratory infection, unspecified: Secondary | ICD-10-CM | POA: Diagnosis not present

## 2017-01-02 DIAGNOSIS — Z720 Tobacco use: Secondary | ICD-10-CM

## 2017-01-02 MED ORDER — ALBUTEROL SULFATE HFA 108 (90 BASE) MCG/ACT IN AERS: 2.0000 | INHALATION_SPRAY | RESPIRATORY_TRACT | 1 refills | Status: DC | PRN

## 2017-01-02 MED ORDER — AZITHROMYCIN 250 MG PO TABS: ORAL_TABLET | ORAL | 0 refills | Status: DC

## 2017-01-02 MED ORDER — PREDNISONE 20 MG PO TABS: ORAL_TABLET | ORAL | 0 refills | Status: DC

## 2017-01-02 MED ORDER — HYDROCODONE-HOMATROPINE 5-1.5 MG/5ML PO SYRP: 5.0000 mL | ORAL_SOLUTION | Freq: Three times a day (TID) | ORAL | 0 refills | Status: DC | PRN

## 2017-01-02 NOTE — Progress Notes (Signed)
Subjective:    Patient ID: Ashley [name], female    DOB: 07-30-1966, 52 y.o.   MRN: [id number]  HPI This is a 51 yo female who presents today with cough x 5 days, little green, yellow, bloody sputum. Cough getting worse, has taken otc cough syrup without improvement. She smokes 1 ppd. No ear pain, some sore throat. Hears some wheezing, feels SOB. Little nasal drainage, no known fever.  She stopped smoking for 2 years, but restarted after her mother died.   Past Medical History:  Diagnosis Date  . Anxiety   . Bronchitis    uses inhaler if needed for bronchitis, lasted used -2014  . Cataract    bilateral  . Chronic headaches    migraines - in past, uses Phenergan for nausea   . Diabetes mellitus without complication ([place])    taken off Metformin since 04/2014, HgbA1C - normal, will follow up with PCP- Dr. Deborra Medina, 07/2014  . GERD (gastroesophageal reflux disease)   . H/O exercise stress test 2011   done at [facility]- told that it was WNL, done due to pt. having panic attacks   . History of blood transfusion 1966-10-25   at birth in [place], [place], unsure number of units  . Poor dentition    very poor oral health   . SVD (spontaneous vaginal delivery)    x 3  . Tobacco abuse    Past Surgical History:  Procedure Laterality Date  . ABDOMINAL HYSTERECTOMY    . APPENDECTOMY  1988  . CARDIAC CATHETERIZATION N/A 08/17/2016   Procedure: Left Heart Cath and Coronary Angiography;  Surgeon: Burnell Blanks, MD;  Location: [facility];  Service: Cardiovascular;  Laterality: N/A;  . CHOLECYSTECTOMY N/A 06/07/2014   Procedure: LAPAROSCOPIC CHOLECYSTECTOMY;  Surgeon: Ralene Ok, MD;  Location: [facility];  Service: General;  Laterality: N/A;  . CHOLECYSTECTOMY  June 07 2014  . COLONOSCOPY  02/16/2014   normal   . ESOPHAGOGASTRODUODENOSCOPY  12/28/2013  . KNEE ARTHROSCOPY  1995   left  . LAPAROSCOPIC ASSISTED VAGINAL HYSTERECTOMY N/A 04/15/2014   Procedure: LAPAROSCOPIC ASSISTED VAGINAL  HYSTERECTOMY;  Surgeon: Marylynn Pearson, MD;  Location: [facility];  Service: Gynecology;  Laterality: N/A;  . LAPAROSCOPIC BILATERAL SALPINGO OOPHERECTOMY Bilateral 04/15/2014   Procedure: LAPAROSCOPIC BILATERAL SALPINGO OOPHORECTOMY;  Surgeon: Marylynn Pearson, MD;  Location: [facility];  Service: Gynecology;  Laterality: Bilateral;  . TUBAL LIGATION     Family History  Problem Relation Age of Onset  . Diabetes Mother   . Hyperlipidemia Mother   . Hypertension Mother   . Prostate cancer Father   . Lung cancer Maternal Grandfather     smoked  . Emphysema Maternal Grandfather     smoked  . COPD Maternal Grandmother     never smoked   . Thyroid cancer Paternal Aunt   . Colon cancer Neg Hx   . Rectal cancer Neg Hx   . Stomach cancer Neg Hx   . Migraines Neg Hx   . Dementia Neg Hx    Social History  Substance Use Topics  . Smoking status: Current Every Day Smoker    Packs/day: 1.00    Years: 35.00    Types: Cigarettes, E-cigarettes  . Smokeless tobacco: Never Used  . Alcohol use No      Review of Systems Per HPI    Objective:   Physical Exam  Constitutional: She is oriented to person, place, and time. She appears well-developed and well-nourished. No distress.  HENT:  Head: Normocephalic and atraumatic.  Right Ear: Tympanic membrane, external ear and ear canal normal.  Left Ear: Tympanic membrane, external ear and ear canal normal.  Nose: Nose normal.  Mouth/Throat: Oropharynx is clear and moist.  Eyes: Conjunctivae are normal.  Neck: Normal range of motion. Neck supple.  Cardiovascular: Normal rate, regular rhythm and normal heart sounds.   Pulmonary/Chest: Effort normal. No respiratory distress. She has wheezes (scattered expiratory wheezes throughout. ).  Lymphadenopathy:    She has no cervical adenopathy.  Neurological: She is alert and oriented to person, place, and time.  Skin: Skin is warm and dry. She is not diaphoretic.  Psychiatric: She has a normal mood and  affect. Her behavior is normal. Judgment and thought content normal.  Vitals reviewed.     BP 110/68 (BP Location: Left Arm, Patient Position: Sitting, Cuff Size: Normal)   Pulse 76   Temp 98.4 F (36.9 C) (Oral)   Wt 163 lb 8 oz (74.2 kg) Comment: with shoes  LMP 02/12/2014   SpO2 97%   BMI 29.90 kg/m  Wt Readings from Last 3 Encounters:  01/02/17 163 lb 8 oz (74.2 kg)  12/26/16 166 lb 8 oz (75.5 kg)  12/04/16 171 lb (77.6 kg)       Assessment & Plan:  1. Acute upper respiratory infection - Provided written and verbal information regarding diagnosis and treatment. - discussed role of cigarette smoking and encouraged her to consider cessation - azithromycin (ZITHROMAX) 250 MG tablet; Take 2 tabs PO x 1 dose, then 1 tab PO QD x 4 days  Dispense: 6 tablet; Refill: 0 - predniSONE (DELTASONE) 20 MG tablet; Take 3 x 3 days, then 2 x 3 days, then 1 x 3 days  Dispense: 18 tablet; Refill: 0 - HYDROcodone-homatropine (HYCODAN) 5-1.5 MG/5ML syrup; Take 5 mLs by mouth every 8 (eight) hours as needed for cough.  Dispense: 120 mL; Refill: 0 - albuterol (PROVENTIL HFA;VENTOLIN HFA) 108 (90 Base) MCG/ACT inhaler; Inhale 2 puffs into the lungs every 4 (four) hours as needed for wheezing or shortness of breath (cough, shortness of breath or wheezing.).  Dispense: 1 Inhaler; Refill: 1  2. Tobacco abuse - encouraged smoking cessation   Clarene Reamer, FNP-BC  [facility], [facility]  01/02/2017 5:39 PM

## 2017-01-02 NOTE — Patient Instructions (Signed)
For nasal congestion you can use Afrin nasal spray for 3 days max, Sudafed, saline nasal spray (generic is fine for all). Drink enough fluids to make your urine light yellow. For fever/chill/muscle aches you can take over the counter acetaminophen or ibuprofen.  Please come back in if you are not better in 5-7 days or if you develop wheezing, shortness of breath or persistent vomiting.   

## 2017-01-04 ENCOUNTER — Other Ambulatory Visit: Payer: Self-pay | Admitting: Family Medicine

## 2017-01-04 ENCOUNTER — Telehealth: Payer: Self-pay | Admitting: *Deleted

## 2017-01-04 DIAGNOSIS — Z1231 Encounter for screening mammogram for malignant neoplasm of breast: Secondary | ICD-10-CM

## 2017-01-04 DIAGNOSIS — N644 Mastodynia: Secondary | ICD-10-CM

## 2017-01-04 NOTE — Telephone Encounter (Signed)
Orders entered

## 2017-01-04 NOTE — Telephone Encounter (Signed)
Pt tried to schedule mammo at the [facility], but they said she needs order for diagnostic mammo and ultrasound because she has bilateral breast tenderness to touch, and occasional soreness. She states this has been going on for a while, and she forgot to mention it at her last cpx because she had a migraine that day and kind of rushed through it.

## 2017-01-08 ENCOUNTER — Ambulatory Visit (INDEPENDENT_AMBULATORY_CARE_PROVIDER_SITE_OTHER): Payer: BLUE CROSS/BLUE SHIELD | Admitting: Family Medicine

## 2017-01-08 ENCOUNTER — Encounter: Payer: Self-pay | Admitting: Family Medicine

## 2017-01-08 VITALS — BP 128/70 | HR 69 | Temp 98.3°F | Wt 168.8 lb

## 2017-01-08 DIAGNOSIS — Z720 Tobacco use: Secondary | ICD-10-CM | POA: Diagnosis not present

## 2017-01-08 DIAGNOSIS — J411 Mucopurulent chronic bronchitis: Secondary | ICD-10-CM | POA: Diagnosis not present

## 2017-01-08 MED ORDER — DEXAMETHASONE SODIUM PHOSPHATE 10 MG/ML IJ SOLN
10.0000 mg | Freq: Once | INTRAMUSCULAR | Status: AC
  Administered 2017-01-08: 10 mg via INTRAMUSCULAR

## 2017-01-08 MED ORDER — LEVOFLOXACIN 500 MG PO TABS: 500.0000 mg | ORAL_TABLET | Freq: Every day | ORAL | 0 refills | Status: DC

## 2017-01-08 MED ORDER — HYDROCOD POLST-CPM POLST ER 10-8 MG/5ML PO SUER: 5.0000 mL | Freq: Two times a day (BID) | ORAL | 0 refills | Status: DC | PRN

## 2017-01-08 MED ORDER — VARENICLINE TARTRATE 0.5 MG X 11 & 1 MG X 42 PO MISC: ORAL | 0 refills | Status: DC

## 2017-01-08 NOTE — Progress Notes (Signed)
Subjective:   Patient ID: Ashley [name], female    DOB: Nov 20, 1966, 51 y.o.   MRN: [id number]  Ashley [name] is a pleasant 51 y.o. year old female who presents to clinic today with Cough  on 01/08/2017  HPI: Ashley [name] on 01/02/17 for URI symptoms. Note reviewed. Had wheezes on exam, placed on Zpack, prednisone and Hycodan and albuterol as needed.  Finished zpack on prednisone. Has two days of prednisone left.  Not feeling any better.  Now having pain in lower back, cough is now more productive. Pain in left lower back.  She is a smoker and ready to try to quit again.  Chantix did work in the past. Current Outpatient Prescriptions on File Prior to Visit  Medication Sig Dispense Refill  . albuterol (PROVENTIL HFA;VENTOLIN HFA) 108 (90 Base) MCG/ACT inhaler Inhale 2 puffs into the lungs every 4 (four) hours as needed for wheezing or shortness of breath (cough, shortness of breath or wheezing.). 1 Inhaler 1  . amitriptyline (ELAVIL) 50 MG tablet Take 1-1/2 tablet at dinner time 45 tablet 3  . aspirin EC 81 MG tablet Take 81 mg by mouth daily.    . clonazePAM (KLONOPIN) 1 MG tablet Take 1 tablet (1 mg total) by mouth 3 (three) times daily as needed for anxiety. 90 tablet 0  . dicyclomine (BENTYL) 10 MG capsule TAKE ONE CAPSULE BY MOUTH TWICE DAILY AS NEEDED FOR  CRAMPING  AND  ABDOMINAL  PAIN 180 capsule 1  . docusate sodium (COLACE) 100 MG capsule Take 100 mg by mouth at bedtime.    . famotidine (PEPCID) 20 MG tablet One at bedtime 30 tablet 11  . metFORMIN (GLUCOPHAGE) 500 MG tablet Take 1 tablet (500 mg total) by mouth daily with breakfast. 30 tablet 3  . omeprazole (PRILOSEC) 20 MG capsule Take 1 capsule (20 mg total) by mouth 2 (two) times daily. 180 capsule 1  . predniSONE (DELTASONE) 20 MG tablet Take 3 x 3 days, then 2 x 3 days, then 1 x 3 days 18 tablet 0  . promethazine (PHENERGAN) 25 MG tablet Take 1 tablet (25 mg total) by mouth every 6 (six) hours as needed for nausea or  vomiting. 30 tablet 0  . rizatriptan (MAXALT-MLT) 10 MG disintegrating tablet Take 1 tablet (10 mg total) by mouth as needed for migraine. May repeat in 2 hours if needed. Do not take more than 3 tablets in a week. 10 tablet 3  . Topiramate ER 25 MG CP24 Take 50 mg by mouth at bedtime. 60 capsule 11  . traMADol (ULTRAM) 50 MG tablet 1-2 every 4 hours as needed for cough or pain 40 tablet 0  . traZODone (DESYREL) 50 MG tablet Take 3 tablets (150 mg total) by mouth at bedtime as needed for sleep. 30 tablet 3  . furosemide (LASIX) 40 MG tablet Take 1 tablet (40 mg total) by mouth daily. (Patient taking differently: Take 40 mg by mouth at bedtime. ) 30 tablet 6   No current facility-administered medications on file prior to visit.     Allergies  Allergen Reactions  . Penicillins Anaphylaxis    Throat swells, Has patient had a PCN reaction causing immediate rash, facial/tongue/throat swelling, SOB or lightheadedness with hypotension: Yes Has patient had a PCN reaction causing severe rash involving mucus membranes or skin necrosis: No Has patient had a PCN reaction that required hospitalization No Has patient had a PCN reaction occurring within the last 10 years: No If  all of the above answers are "NO", then may proceed with Cephalosporin use.   . Topamax [Topiramate] Other (See Comments)    Hands and feet to go numb    Past Medical History:  Diagnosis Date  . Anxiety   . Bronchitis    uses inhaler if needed for bronchitis, lasted used -2014  . Cataract    bilateral  . Chronic headaches    migraines - in past, uses Phenergan for nausea   . Diabetes mellitus without complication ([place])    taken off Metformin since 04/2014, HgbA1C - normal, will follow up with PCP- Dr. Deborra Medina, 07/2014  . GERD (gastroesophageal reflux disease)   . H/O exercise stress test 2011   done at [facility]- told that it was WNL, done due to pt. having panic attacks   . History of blood transfusion 04-11-1966   at  birth in [place], [place], unsure number of units  . Poor dentition    very poor oral health   . SVD (spontaneous vaginal delivery)    x 3  . Tobacco abuse     Past Surgical History:  Procedure Laterality Date  . ABDOMINAL HYSTERECTOMY    . APPENDECTOMY  1988  . CARDIAC CATHETERIZATION N/A 08/17/2016   Procedure: Left Heart Cath and Coronary Angiography;  Surgeon: Burnell Blanks, MD;  Location: [facility];  Service: Cardiovascular;  Laterality: N/A;  . CHOLECYSTECTOMY N/A 06/07/2014   Procedure: LAPAROSCOPIC CHOLECYSTECTOMY;  Surgeon: Ralene Ok, MD;  Location: [facility];  Service: General;  Laterality: N/A;  . CHOLECYSTECTOMY  June 07 2014  . COLONOSCOPY  02/16/2014   normal   . ESOPHAGOGASTRODUODENOSCOPY  12/28/2013  . KNEE ARTHROSCOPY  1995   left  . LAPAROSCOPIC ASSISTED VAGINAL HYSTERECTOMY N/A 04/15/2014   Procedure: LAPAROSCOPIC ASSISTED VAGINAL HYSTERECTOMY;  Surgeon: Marylynn Pearson, MD;  Location: [facility];  Service: Gynecology;  Laterality: N/A;  . LAPAROSCOPIC BILATERAL SALPINGO OOPHERECTOMY Bilateral 04/15/2014   Procedure: LAPAROSCOPIC BILATERAL SALPINGO OOPHORECTOMY;  Surgeon: Marylynn Pearson, MD;  Location: [facility];  Service: Gynecology;  Laterality: Bilateral;  . TUBAL LIGATION      Family History  Problem Relation Age of Onset  . Diabetes Mother   . Hyperlipidemia Mother   . Hypertension Mother   . Prostate cancer Father   . Lung cancer Maternal Grandfather     smoked  . Emphysema Maternal Grandfather     smoked  . COPD Maternal Grandmother     never smoked   . Thyroid cancer Paternal Aunt   . Colon cancer Neg Hx   . Rectal cancer Neg Hx   . Stomach cancer Neg Hx   . Migraines Neg Hx   . Dementia Neg Hx     Social History   Social History  . Marital status: Married    Spouse name: Alvester Chou  . Number of children: 6  . Years of education: 43   Occupational History  .       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  Smoking status: Current Every Day Smoker    Packs/day: 1.00    Years: 35.00    Types: Cigarettes, E-cigarettes  . Smokeless tobacco: Never Used  . Alcohol use No  . Drug use: No  . Sexual activity: Yes    Birth control/ protection: Pill   Other Topics Concern  . Not on file   Social History Narrative   5 living children, one child is a crack  cocaine addict who often breaks into their house to steal money. One child died of cerebral palsy.   Caffeine use: Dr Malachi Bonds (3 per day)   2 cups coffee per day   The PMH, PSH, Social History, Family History, Medications, and allergies have been reviewed in [facility], and have been updated if relevant.   Review of Systems  Constitutional: Positive for fatigue and fever.  HENT: Positive for congestion, postnasal drip, rhinorrhea and sinus pain.   Eyes: Negative.   Respiratory: Positive for cough and shortness of breath.   Cardiovascular: Negative.   Gastrointestinal: Negative.   Endocrine: Negative.   Genitourinary: Negative.   Musculoskeletal: Positive for back pain.  Allergic/Immunologic: Negative.   Neurological: Negative.   Hematological: Negative.   Psychiatric/Behavioral: Negative.   All other systems reviewed and are negative.      Objective:    BP 128/70   Pulse 69   Temp 98.3 F (36.8 C) (Oral)   Wt 168 lb 12 oz (76.5 kg)   LMP 02/12/2014   SpO2 96%   BMI 30.86 kg/m    Physical Exam  Constitutional: She is oriented to person, place, and time. She appears well-developed and well-nourished. No distress.  HENT:  Head: Normocephalic and atraumatic.  Eyes: Conjunctivae are normal.  Cardiovascular: Normal rate and regular rhythm.   Pulmonary/Chest: No accessory muscle usage. No respiratory distress. She has decreased breath sounds in the left lower field. She has wheezes in the right lower field, the left middle field and the left lower field. She has rhonchi.  Musculoskeletal: Normal range of motion.  Neurological: She is  alert and oriented to person, place, and time. No cranial nerve deficit.  Skin: Skin is warm and dry. She is not diaphoretic.  Psychiatric: She has a normal mood and affect. Her behavior is normal. Judgment and thought content normal.  Nursing note and vitals reviewed.         Assessment & Plan:   Mucopurulent chronic bronchitis ([place])  Tobacco abuse No Follow-up on file.

## 2017-01-08 NOTE — Assessment & Plan Note (Addendum)
Smoking cessation instruction/counseling given:  counseled patient on the dangers of tobacco use, advised patient to stop smoking, and reviewed strategies to maximize success  Chantix started pack rx printed and given to pt.

## 2017-01-08 NOTE — Assessment & Plan Note (Signed)
Deteriorated. Lung sounds concerning for CAP. Will treat with avelox. IM decadron, finish prednisone back as well (can wait to take dose tomorrow). Tussionex rx printed. Call or return to clinic prn if these symptoms worsen or fail to improve as anticipated. The patient indicates understanding of these issues and agrees with the plan.

## 2017-01-08 NOTE — Progress Notes (Signed)
Pre visit review using our clinic review tool, if applicable. No additional management support is needed unless otherwise documented below in the visit note. 

## 2017-01-13 ENCOUNTER — Other Ambulatory Visit: Payer: Self-pay | Admitting: Nurse Practitioner

## 2017-01-13 DIAGNOSIS — R310 Gross hematuria: Secondary | ICD-10-CM

## 2017-01-13 DIAGNOSIS — R300 Dysuria: Secondary | ICD-10-CM

## 2017-01-14 ENCOUNTER — Telehealth: Payer: Self-pay

## 2017-01-14 NOTE — Telephone Encounter (Signed)
I spoke with pt and she is having CP and pain in back that is worse when coughs; pt has non productive cough; pt chilling but has not taken temp. No SOB but is having some wheezing.Pt only wants to see Dr Deborra Medina; appt scheduled 01/15/17 at 7:15. If pt condition changes or worsens prior to appt pt will go to UC. FYI to Dr Deborra Medina.

## 2017-01-14 NOTE — Telephone Encounter (Signed)
Routing to patient's new pcp to handle 

## 2017-01-14 NOTE — Telephone Encounter (Signed)
PLEASE NOTE: All timestamps contained within this report are represented as [place] Standard Time. CONFIDENTIALTY NOTICE: This fax transmission is intended only for the addressee. It contains information that is legally privileged, confidential or otherwise protected from use or disclosure. If you are not the intended recipient, you are strictly prohibited from reviewing, disclosing, copying using or disseminating any of this information or taking any action in reliance on or regarding this information. If you have received this fax in error, please notify us immediately by telephone so that we can arrange for its return to [place]. Phone: [phone], Toll-Free: [phone], Fax: [phone] Page: 1 of 2 Call Id: [id number] [place] Patient Name: Ashley [name] Gender: Female DOB: 01-07-1966 Age: 51 Y Return Phone Number: [phone] (Primary) Address: [place]/State/Zip: [place] [address] [place] Night - Client Client Site [place] Physician Arnette Norris - MD Contact Type Call Who Is Calling Patient / Member / Family / Caregiver Call Type Triage / Clinical Relationship To Patient Self Return Phone Number [phone] (Primary) Chief Complaint Chest Pain (non urgent symptoms) Reason for Call Symptomatic / Request for [place] states that she is being treated for pneumonia and has developed chest pain PreDisposition Did not know what to do Translation No Nurse Assessment Nurse: Dimas Chyle, RN, Dellis Filbert Date/Time (Eastern Time): 01/13/2017 1:13:43 PM Confirm and document reason for call. If symptomatic, describe symptoms. ---Caller states that she is being treated for pneumonia and has developed chest pain. Seen in office on 01/08/17. Is on 2nd round of antibiotics. No fever. Does the  patient have any new or worsening symptoms? ---Yes Will a triage be completed? ---Yes Related visit to physician within the last 2 weeks? ---Yes Does the PT have any chronic conditions? (i.e. diabetes, asthma, etc.) ---Yes List chronic conditions. ---Diabetes type 2 Is the patient pregnant or possibly pregnant? (Ask all females between the ages of 30-55) ---No Is this a behavioral health or substance abuse call? ---No Guidelines Guideline Title Affirmed Question Affirmed Notes Nurse Date/Time (Eastern Time) Pneumonia on Antibiotic Post-Hospitalization Follow-up Call Continuous (nonstop) coughing interferes with work or school [place], Therapist, sports, Dellis Filbert 01/13/2017 1:15:22 PM Disp. Time Eilene Ghazi Time) Disposition Final User 01/13/2017 1:12:13 PM Send to Urgent Queue [place] 01/13/2017 1:17:55 PM See Physician within 24 Hours Yes Dimas Chyle, RN, Dellis Filbert PLEASE NOTE: All timestamps contained within this report are represented as [place] Standard Time. CONFIDENTIALTY NOTICE: This fax transmission is intended only for the addressee. It contains information that is legally privileged, confidential or otherwise protected from use or disclosure. If you are not the intended recipient, you are strictly prohibited from reviewing, disclosing, copying using or disseminating any of this information or taking any action in reliance on or regarding this information. If you have received this fax in error, please notify us immediately by telephone so that we can arrange for its return to [place]. Phone: [phone], Toll-Free: [phone], Fax: [phone] Page: 2 of 2 Call Id: [id number] Caller Understands: Yes Disagree/Comply: Comply Care Advice Given Per Guideline SEE PHYSICIAN WITHIN 24 HOURS: * IF OFFICE WILL BE OPEN: You need to be seen within the next 24 hours. Call your doctor when the office opens, and make an appointment. COUGH MEDICINES: * OTC COUGH SYRUPS: The most common cough suppressant in  OTC cough medications is dextromethorphan. Often the letters 'DM' appear in the name. *  OTC COUGH DROPS: Cough drops can help a lot, especially for mild coughs. They reduce coughing by soothing your irritated throat and removing that tickle sensation in the back of the throat. Cough drops also have the advantage of portability - you can carry them with you. * HOME REMEDY - HARD CANDY: Hard candy works just as well as medicine-flavored OTC cough drops. Diabetics should use sugar-free candy. * HOME REMEDY - HONEY: This old home remedy has been shown to help decrease coughing at night. The adult dosage is 2 teaspoons (10 ml) at bedtime. Honey should not be given to infants under one year of age. OTC COUGH SYRUP - DEXTROMETHORPHAN: * Cough syrups containing the cough suppressant dextromethorphan (DM) may help decrease your cough. Cough syrups work best for coughs that keep you awake at night. They can also sometimes help in the late stages of a respiratory infection when the cough is dry and hacking. They can be used along with cough drops. * Examples: Benylin, Robitussin DM, Vicks 44 Cough Relief * Read the package instructions for dosage, contraindications, and other important information. CAUTION - DEXTROMETHORPHAN: * Do not try to completely suppress coughs that produce mucus and phlegm. Remember that coughing is helpful in bringing up mucus from the lungs and preventing pneumonia. * Research Notes: Dextromethorphan in some research studies has been shown to reduce the frequency and severity of cough in adults (18 years or older) without significant adverse effects. However, other studies suggest that dextromethorphan is no better than placebo at reducing a cough. * Drug Abuse Potential: It should be noted that dextromethorphan has become a drug of abuse. This problem is seen most often in adolescents. Overdose symptoms can range from giggling and euphoria to hallucinations and coma. *  CONTRAINDICATED: Do not take dextromethorphan if you are taking a monoamine oxidase (MAO) inhibitor now or in the past 2 weeks. Examples of MAO inhibitors include isocarboxazid (Marplan), phenelzine (Nardil), selegiline (Eldepryl, Emsam, Zelapar), and tranylcypromine (Parnate). Do not take dextromethorphan if you are taking venlafaxine (Effexor). HUMIDIFIER: If the air is dry, use a humidifier in the bedroom. (Reason: dry air makes coughs worse) COUGHING SPELLS: * Drink warm fluids. Inhale warm mist. (Reason: both relax the airway and loosen up the phlegm) * Suck on cough drops or hard candy to coat the irritated throat. CALL BACK IF: * Fever occurs * New or worsening difficulty breathing * You become worse. CARE ADVICE given per Pneumonia on Antibiotic Post-Hospitalization Follow-Up Call (Adult) guideline. Referrals REFERRED TO PCP OFFICE

## 2017-01-15 ENCOUNTER — Encounter: Payer: Self-pay | Admitting: Family Medicine

## 2017-01-15 ENCOUNTER — Ambulatory Visit (INDEPENDENT_AMBULATORY_CARE_PROVIDER_SITE_OTHER): Payer: BLUE CROSS/BLUE SHIELD | Admitting: Family Medicine

## 2017-01-15 DIAGNOSIS — R05 Cough: Secondary | ICD-10-CM

## 2017-01-15 DIAGNOSIS — R059 Cough, unspecified: Secondary | ICD-10-CM

## 2017-01-15 MED ORDER — TRAMADOL HCL 50 MG PO TABS: ORAL_TABLET | ORAL | 0 refills | Status: DC

## 2017-01-15 NOTE — Assessment & Plan Note (Signed)
Persistent but lung sounds improved. Rx printed for tramadol as this has been beneficial for her in past for her cough. Call or return to clinic prn if these symptoms worsen or fail to improve as anticipated. The patient indicates understanding of these issues and agrees with the plan.

## 2017-01-15 NOTE — Patient Instructions (Signed)
Great to see you. Please make an appointment to see Dr. Melvyn Novas

## 2017-01-15 NOTE — Progress Notes (Signed)
Subjective:   Patient ID: Ashley [name], female    DOB: 07-Apr-1966, 51 y.o.   MRN: [id number]  Ashley [name] is a pleasant 51 y.o. year old female who presents to clinic today with Follow-up (cough x3wks)  on 01/15/2017  HPI:  Here for persistent cough.  I saw her last week, on 01/08/17- note reviewed.  At that time, she was following up from a visit with Tor Netters on 01/02/17 for URI symptoms. Debbie had placed her on zpack, prednisone, hycodan and prn albuterol. When I saw her 6 days later, she reported that she was not feeling any better.  She did say she was ready to quit smoking and I therefore sent in a chantix starter pack to her pharmacy.  Also when I saw her on 01/08/17, her lung sounds were concerning for CAP so I placed her on avelox and gave her a decadron IM injection in the office. Tussionex rx was also printed and given to pt.  Here today because cough is persisting.  Otherwise, feels better. Current Outpatient Prescriptions on File Prior to Visit  Medication Sig Dispense Refill  . albuterol (PROVENTIL HFA;VENTOLIN HFA) 108 (90 Base) MCG/ACT inhaler Inhale 2 puffs into the lungs every 4 (four) hours as needed for wheezing or shortness of breath (cough, shortness of breath or wheezing.). 1 Inhaler 1  . amitriptyline (ELAVIL) 50 MG tablet Take 1-1/2 tablet at dinner time 45 tablet 3  . aspirin EC 81 MG tablet Take 81 mg by mouth daily.    . chlorpheniramine-HYDROcodone (TUSSIONEX PENNKINETIC ER) 10-8 MG/5ML SUER Take 5 mLs by mouth every 12 (twelve) hours as needed. 140 mL 0  . clonazePAM (KLONOPIN) 1 MG tablet Take 1 tablet (1 mg total) by mouth 3 (three) times daily as needed for anxiety. 90 tablet 0  . dicyclomine (BENTYL) 10 MG capsule TAKE ONE CAPSULE BY MOUTH TWICE DAILY AS NEEDED FOR  CRAMPING  AND  ABDOMINAL  PAIN 180 capsule 1  . docusate sodium (COLACE) 100 MG capsule Take 100 mg by mouth at bedtime.    . famotidine (PEPCID) 20 MG tablet One at bedtime 30 tablet  11  . metFORMIN (GLUCOPHAGE) 500 MG tablet Take 1 tablet (500 mg total) by mouth daily with breakfast. 30 tablet 3  . omeprazole (PRILOSEC) 20 MG capsule Take 1 capsule (20 mg total) by mouth 2 (two) times daily. 180 capsule 1  . promethazine (PHENERGAN) 25 MG tablet Take 1 tablet (25 mg total) by mouth every 6 (six) hours as needed for nausea or vomiting. 30 tablet 0  . rizatriptan (MAXALT-MLT) 10 MG disintegrating tablet Take 1 tablet (10 mg total) by mouth as needed for migraine. May repeat in 2 hours if needed. Do not take more than 3 tablets in a week. 10 tablet 3  . traZODone (DESYREL) 50 MG tablet Take 3 tablets (150 mg total) by mouth at bedtime as needed for sleep. 30 tablet 3  . varenicline (CHANTIX STARTING MONTH PAK) 0.5 MG X 11 & 1 MG X 42 tablet Take one 0.5 mg tablet by mouth once daily for 3 days, then increase to one 0.5 mg tablet twice daily for 4 days, then increase to one 1 mg tablet twice daily. 53 tablet 0  . furosemide (LASIX) 40 MG tablet Take 1 tablet (40 mg total) by mouth daily. (Patient taking differently: Take 40 mg by mouth at bedtime. ) 30 tablet 6   No current facility-administered medications on file prior to visit.  Allergies  Allergen Reactions  . Penicillins Anaphylaxis    Throat swells, Has patient had a PCN reaction causing immediate rash, facial/tongue/throat swelling, SOB or lightheadedness with hypotension: Yes Has patient had a PCN reaction causing severe rash involving mucus membranes or skin necrosis: No Has patient had a PCN reaction that required hospitalization No Has patient had a PCN reaction occurring within the last 10 years: No If all of the above answers are "NO", then may proceed with Cephalosporin use.   . Topamax [Topiramate] Other (See Comments)    Hands and feet to go numb    Past Medical History:  Diagnosis Date  . Anxiety   . Bronchitis    uses inhaler if needed for bronchitis, lasted used -2014  . Cataract    bilateral  .  Chronic headaches    migraines - in past, uses Phenergan for nausea   . Diabetes mellitus without complication ([place])    taken off Metformin since 04/2014, HgbA1C - normal, will follow up with PCP- Dr. Deborra Medina, 07/2014  . GERD (gastroesophageal reflux disease)   . H/O exercise stress test 2011   done at [facility]- told that it was WNL, done due to pt. having panic attacks   . History of blood transfusion 1966-03[birth year]20   at birth in [place], [place], unsure number of units  . Poor dentition    very poor oral health   . SVD (spontaneous vaginal delivery)    x 3  . Tobacco abuse     Past Surgical History:  Procedure Laterality Date  . ABDOMINAL HYSTERECTOMY    . APPENDECTOMY  1988  . CARDIAC CATHETERIZATION N/A 08/17/2016   Procedure: Left Heart Cath and Coronary Angiography;  Surgeon: Burnell Blanks, MD;  Location: [facility];  Service: Cardiovascular;  Laterality: N/A;  . CHOLECYSTECTOMY N/A 06/07/2014   Procedure: LAPAROSCOPIC CHOLECYSTECTOMY;  Surgeon: Ralene Ok, MD;  Location: [facility];  Service: General;  Laterality: N/A;  . CHOLECYSTECTOMY  June 07 2014  . COLONOSCOPY  02/16/2014   normal   . ESOPHAGOGASTRODUODENOSCOPY  12/28/2013  . KNEE ARTHROSCOPY  1995   left  . LAPAROSCOPIC ASSISTED VAGINAL HYSTERECTOMY N/A 04/15/2014   Procedure: LAPAROSCOPIC ASSISTED VAGINAL HYSTERECTOMY;  Surgeon: Marylynn Pearson, MD;  Location: [facility];  Service: Gynecology;  Laterality: N/A;  . LAPAROSCOPIC BILATERAL SALPINGO OOPHERECTOMY Bilateral 04/15/2014   Procedure: LAPAROSCOPIC BILATERAL SALPINGO OOPHORECTOMY;  Surgeon: Marylynn Pearson, MD;  Location: [facility];  Service: Gynecology;  Laterality: Bilateral;  . TUBAL LIGATION      Family History  Problem Relation Age of Onset  . Diabetes Mother   . Hyperlipidemia Mother   . Hypertension Mother   . Prostate cancer Father   . Lung cancer Maternal Grandfather     smoked  . Emphysema Maternal Grandfather     smoked  . COPD Maternal  Grandmother     never smoked   . Thyroid cancer Paternal Aunt   . Colon cancer Neg Hx   . Rectal cancer Neg Hx   . Stomach cancer Neg Hx   . Migraines Neg Hx   . Dementia Neg Hx     Social History   Social History  . Marital status: Married    Spouse name: Alvester Chou  . Number of children: 6  . Years of education: 40   Occupational History  .       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 Smoking status: Current Every Day  Smoker    Packs/day: 1.00    Years: 35.00    Types: Cigarettes, E-cigarettes  . Smokeless tobacco: Never Used  . Alcohol use No  . Drug use: No  . Sexual activity: Yes    Birth control/ protection: Pill   Other Topics Concern  . Not on file   Social History Narrative   5 living children, one child is a crack cocaine addict who often breaks into their house to steal money. One child died of cerebral palsy.   Caffeine use: Dr Malachi Bonds (3 per day)   2 cups coffee per day  .The PMH, PSH, Social History, Family History, Medications, and allergies have been reviewed in [facility], and have been updated if relevant.   Review of Systems  Constitutional: Negative.   Respiratory: Positive for cough. Negative for shortness of breath and stridor.   Cardiovascular: Negative.   Musculoskeletal: Negative.   Neurological: Negative.   Hematological: Negative.   Psychiatric/Behavioral: Negative.   All other systems reviewed and are negative.      Objective:    BP 128/64   Pulse 70   Temp 98 F (36.7 C) (Oral)   Wt 164 lb (74.4 kg)   LMP 02/12/2014   SpO2 95%   BMI 30.00 kg/m    Physical Exam  Constitutional: She is oriented to person, place, and time. She appears well-developed and well-nourished. No distress.  HENT:  Head: Normocephalic.  Eyes: Conjunctivae are normal.  Cardiovascular: Normal rate and regular rhythm.   Pulmonary/Chest: Effort normal and breath sounds normal.  Neurological: She is alert and oriented to person, place,  and time. No cranial nerve deficit.  Skin: Skin is warm and dry. She is not diaphoretic.  Psychiatric: She has a normal mood and affect. Her behavior is normal. Judgment and thought content normal.  Nursing note and vitals reviewed.         Assessment & Plan:   Cough No Follow-up on file.

## 2017-01-24 ENCOUNTER — Ambulatory Visit
Admission: RE | Admit: 2017-01-24 | Discharge: 2017-01-24 | Disposition: A | Discharge status: Home / Self Care | Payer: BLUE CROSS/BLUE SHIELD | Source: Ambulatory Visit | Attending: Family Medicine | Admitting: Family Medicine

## 2017-01-24 DIAGNOSIS — N644 Mastodynia: Secondary | ICD-10-CM

## 2017-02-11 ENCOUNTER — Ambulatory Visit (INDEPENDENT_AMBULATORY_CARE_PROVIDER_SITE_OTHER): Payer: BLUE CROSS/BLUE SHIELD | Admitting: Internal Medicine

## 2017-02-11 ENCOUNTER — Encounter: Payer: Self-pay | Admitting: Internal Medicine

## 2017-02-11 ENCOUNTER — Other Ambulatory Visit: Payer: Self-pay | Admitting: Family Medicine

## 2017-02-11 VITALS — BP 112/64 | HR 91 | Temp 97.5°F | Ht 62.0 in | Wt 160.6 lb

## 2017-02-11 DIAGNOSIS — G43119 Migraine with aura, intractable, without status migrainosus: Secondary | ICD-10-CM | POA: Diagnosis not present

## 2017-02-11 MED ORDER — KETOROLAC TROMETHAMINE 60 MG/2ML IM SOLN
60.0000 mg | Freq: Once | INTRAMUSCULAR | Status: AC
  Administered 2017-02-11: 60 mg via INTRAMUSCULAR

## 2017-02-11 MED ORDER — PROMETHAZINE HCL 25 MG PO TABS
25.0000 mg | ORAL_TABLET | Freq: Once | ORAL | Status: AC
  Administered 2017-02-11: 25 mg via ORAL

## 2017-02-11 NOTE — Progress Notes (Signed)
Pre visit review using our clinic review tool, if applicable. No additional management support is needed unless otherwise documented below in the visit note. 

## 2017-02-11 NOTE — Addendum Note (Signed)
Addended by: Abelardo Diesel on: 02/11/2017 02:39 PM   Modules accepted: Orders

## 2017-02-11 NOTE — Progress Notes (Signed)
Subjective:    Patient ID: Ashley [name], female    DOB: 08-27-1966, 51 y.o.   MRN: [id number]  HPI  51 year old patient who has chronic migraine headaches and is followed by neurology. She had the onset of a migraine headache approximately 72 hours ago.  She took Maxalt twice Friday morning and has had persistent headache for the past 3 days.  This is associated with nausea and light sensitivity.   Otherwise, she has been on Phenergan only. She is scheduled for neurology follow-up and Botox injection in 3 days  Past Medical History:  Diagnosis Date  . Anxiety   . Bronchitis    uses inhaler if needed for bronchitis, lasted used -2014  . Cataract    bilateral  . Chronic headaches    migraines - in past, uses Phenergan for nausea   . Diabetes mellitus without complication ([place])    taken off Metformin since 04/2014, HgbA1C - normal, will follow up with PCP- Ashley [name], 07/2014  . GERD (gastroesophageal reflux disease)   . H/O exercise stress test 2011   done at [facility]- told that it was WNL, done due to pt. having panic attacks   . History of blood transfusion 08[birth year]24[birth year]66   at birth in [place], [place], unsure number of units  . Poor dentition    very poor oral health   . SVD (spontaneous vaginal delivery)    x 3  . Tobacco abuse      Social History   Social History  . Marital status: Married    Spouse name: Ashley [name]  . Number of children: 6  . Years of education: 26   Occupational History  .       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 Smoking status: Current Every Day Smoker    Packs/day: 1.00    Years: 35.00    Types: Cigarettes, E-cigarettes  . Smokeless tobacco: Never Used  . Alcohol use No  . Drug use: No  . Sexual activity: Yes    Birth control/ protection: Pill   Other Topics Concern  . Not on file   Social History Narrative   5 living children, one child is a crack cocaine addict who often breaks into their house to steal money. One child  died of cerebral palsy.   Caffeine use: Ashley [name] (3 per day)   2 cups coffee per day    Past Surgical History:  Procedure Laterality Date  . ABDOMINAL HYSTERECTOMY    . APPENDECTOMY  1988  . CARDIAC CATHETERIZATION N/A 08/17/2016   Procedure: Left Heart Cath and Coronary Angiography;  Surgeon: Ashley Blanks, MD;  Location: [facility];  Service: Cardiovascular;  Laterality: N/A;  . CHOLECYSTECTOMY N/A 06/07/2014   Procedure: LAPAROSCOPIC CHOLECYSTECTOMY;  Surgeon: Ashley Ok, MD;  Location: [facility];  Service: General;  Laterality: N/A;  . CHOLECYSTECTOMY  June 07 2014  . COLONOSCOPY  02/16/2014   normal   . ESOPHAGOGASTRODUODENOSCOPY  12/28/2013  . KNEE ARTHROSCOPY  1995   left  . LAPAROSCOPIC ASSISTED VAGINAL HYSTERECTOMY N/A 04/15/2014   Procedure: LAPAROSCOPIC ASSISTED VAGINAL HYSTERECTOMY;  Surgeon: Ashley Pearson, MD;  Location: [facility];  Service: Gynecology;  Laterality: N/A;  . LAPAROSCOPIC BILATERAL SALPINGO OOPHERECTOMY Bilateral 04/15/2014   Procedure: LAPAROSCOPIC BILATERAL SALPINGO OOPHORECTOMY;  Surgeon: Ashley Pearson, MD;  Location: [facility];  Service: Gynecology;  Laterality: Bilateral;  . TUBAL LIGATION      Family History  Problem Relation  Age of Onset  . Diabetes Mother   . Hyperlipidemia Mother   . Hypertension Mother   . Prostate cancer Father   . Lung cancer Maternal Grandfather     smoked  . Emphysema Maternal Grandfather     smoked  . COPD Maternal Grandmother     never smoked   . Thyroid cancer Paternal Aunt   . Colon cancer Neg Hx   . Rectal cancer Neg Hx   . Stomach cancer Neg Hx   . Migraines Neg Hx   . Dementia Neg Hx     Allergies  Allergen Reactions  . Penicillins Anaphylaxis    Throat swells, Has patient had a PCN reaction causing immediate rash, facial/tongue/throat swelling, SOB or lightheadedness with hypotension: Yes Has patient had a PCN reaction causing severe rash involving mucus membranes or skin necrosis:  No Has patient had a PCN reaction that required hospitalization No Has patient had a PCN reaction occurring within the last 10 years: No If all of the above answers are "NO", then may proceed with Cephalosporin use.   . Topamax [Topiramate] Other (See Comments)    Hands and feet to go numb    Current Outpatient Prescriptions on File Prior to Visit  Medication Sig Dispense Refill  . amitriptyline (ELAVIL) 50 MG tablet Take 1-1/2 tablet at dinner time 45 tablet 3  . aspirin EC 81 MG tablet Take 81 mg by mouth daily.    . chlorpheniramine-HYDROcodone (TUSSIONEX PENNKINETIC ER) 10-8 MG/5ML SUER Take 5 mLs by mouth every 12 (twelve) hours as needed. 140 mL 0  . clonazePAM (KLONOPIN) 1 MG tablet Take 1 tablet (1 mg total) by mouth 3 (three) times daily as needed for anxiety. 90 tablet 0  . dicyclomine (BENTYL) 10 MG capsule TAKE ONE CAPSULE BY MOUTH TWICE DAILY AS NEEDED FOR  CRAMPING  AND  ABDOMINAL  PAIN 180 capsule 1  . docusate sodium (COLACE) 100 MG capsule Take 100 mg by mouth at bedtime.    . famotidine (PEPCID) 20 MG tablet One at bedtime 30 tablet 11  . metFORMIN (GLUCOPHAGE) 500 MG tablet Take 1 tablet (500 mg total) by mouth daily with breakfast. 30 tablet 3  . omeprazole (PRILOSEC) 20 MG capsule Take 1 capsule (20 mg total) by mouth 2 (two) times daily. 180 capsule 1  . promethazine (PHENERGAN) 25 MG tablet Take 1 tablet (25 mg total) by mouth every 6 (six) hours as needed for nausea or vomiting. 30 tablet 0  . rizatriptan (MAXALT-MLT) 10 MG disintegrating tablet Take 1 tablet (10 mg total) by mouth as needed for migraine. May repeat in 2 hours if needed. Do not take more than 3 tablets in a week. 10 tablet 3  . traMADol (ULTRAM) 50 MG tablet 1-2 every 4 hours as needed for cough or pain 40 tablet 0  . traZODone (DESYREL) 50 MG tablet Take 3 tablets (150 mg total) by mouth at bedtime as needed for sleep. 30 tablet 3  . varenicline (CHANTIX STARTING MONTH PAK) 0.5 MG X 11 & 1 MG X 42  tablet Take one 0.5 mg tablet by mouth once daily for 3 days, then increase to one 0.5 mg tablet twice daily for 4 days, then increase to one 1 mg tablet twice daily. 53 tablet 0  . furosemide (LASIX) 40 MG tablet Take 1 tablet (40 mg total) by mouth daily. (Patient taking differently: Take 40 mg by mouth at bedtime. ) 30 tablet 6   No current facility-administered medications  on file prior to visit.     BP 112/64 (BP Location: Left Arm, Patient Position: Sitting, Cuff Size: Normal)   Pulse 91   Temp 97.5 F (36.4 C) (Oral)   Ht 5\' 2"  (1.575 m)   Wt 160 lb 9.6 oz (72.8 kg)   LMP 02/12/2014   SpO2 98%   BMI 29.37 kg/m     Review of Systems  Constitutional: Positive for activity change, appetite change and fatigue.  HENT: Negative for congestion, dental problem, hearing loss, rhinorrhea, sinus pressure, sore throat and tinnitus.   Eyes: Negative for pain, discharge and visual disturbance.  Respiratory: Positive for cough. Negative for shortness of breath.   Cardiovascular: Negative for chest pain, palpitations and leg swelling.  Gastrointestinal: Positive for nausea. Negative for abdominal distention, abdominal pain, blood in stool, constipation, diarrhea and vomiting.  Genitourinary: Negative for difficulty urinating, dysuria, flank pain, frequency, hematuria, pelvic pain, urgency, vaginal bleeding, vaginal discharge and vaginal pain.  Musculoskeletal: Negative for arthralgias, gait problem and joint swelling.  Skin: Negative for rash.  Neurological: Positive for headaches. Negative for dizziness, syncope, speech difficulty, weakness and numbness.  Hematological: Negative for adenopathy.  Psychiatric/Behavioral: Negative for agitation, behavioral problems and dysphoric mood. The patient is not nervous/anxious.        Objective:   Physical Exam  Constitutional: She is oriented to person, place, and time. She appears well-developed and well-nourished.  Sitting in darkened  room Appears unwell, but in no acute distress  HENT:  Head: Normocephalic.  Right Ear: External ear normal.  Left Ear: External ear normal.  Mouth/Throat: Oropharynx is clear and moist.  Eyes: Conjunctivae and EOM are normal. Pupils are equal, round, and reactive to light.  Neck: Normal range of motion. Neck supple. No thyromegaly present.  No meningismus  Cardiovascular: Normal rate, regular rhythm, normal heart sounds and intact distal pulses.   Pulmonary/Chest: Effort normal and breath sounds normal. No respiratory distress. She has no wheezes.  Abdominal: Soft. Bowel sounds are normal. She exhibits no mass. There is no tenderness.  Musculoskeletal: Normal range of motion.  Lymphadenopathy:    She has no cervical adenopathy.  Neurological: She is alert and oriented to person, place, and time.  Skin: Skin is warm and dry. No rash noted.  Psychiatric: She has a normal mood and affect. Her behavior is normal.          Assessment & Plan:   Refractory migraine headache.  Will treat with Toradol 60 and Phenergan 50.  She does have a daughter to drive her home Follow-up neurology in 3 days  Ignacia Gentzler Pilar Plate

## 2017-02-11 NOTE — Patient Instructions (Signed)
Follow-up neurology in 3 days as scheduled  Call or return to clinic prn if these symptoms worsen or fail to improve as anticipated.

## 2017-02-12 ENCOUNTER — Telehealth: Payer: Self-pay | Admitting: Neurology

## 2017-02-12 ENCOUNTER — Emergency Department (HOSPITAL_COMMUNITY)
Admission: EM | Admit: 2017-02-12 | Discharge: 2017-02-12 | Disposition: A | Discharge status: Home / Self Care | Payer: BLUE CROSS/BLUE SHIELD | Attending: Emergency Medicine | Admitting: Emergency Medicine

## 2017-02-12 ENCOUNTER — Emergency Department (HOSPITAL_COMMUNITY): Payer: BLUE CROSS/BLUE SHIELD

## 2017-02-12 ENCOUNTER — Telehealth: Payer: Self-pay | Admitting: Family Medicine

## 2017-02-12 ENCOUNTER — Encounter (HOSPITAL_COMMUNITY): Payer: Self-pay

## 2017-02-12 DIAGNOSIS — R930 Abnormal findings on diagnostic imaging of skull and head, not elsewhere classified: Secondary | ICD-10-CM | POA: Diagnosis not present

## 2017-02-12 DIAGNOSIS — G8191 Hemiplegia, unspecified affecting right dominant side: Secondary | ICD-10-CM | POA: Diagnosis not present

## 2017-02-12 DIAGNOSIS — G43709 Chronic migraine without aura, not intractable, without status migrainosus: Secondary | ICD-10-CM | POA: Diagnosis not present

## 2017-02-12 DIAGNOSIS — M6281 Muscle weakness (generalized): Secondary | ICD-10-CM | POA: Diagnosis not present

## 2017-02-12 DIAGNOSIS — R200 Anesthesia of skin: Secondary | ICD-10-CM | POA: Diagnosis not present

## 2017-02-12 DIAGNOSIS — Z7982 Long term (current) use of aspirin: Secondary | ICD-10-CM | POA: Insufficient documentation

## 2017-02-12 DIAGNOSIS — F450 Somatization disorder: Secondary | ICD-10-CM | POA: Diagnosis not present

## 2017-02-12 DIAGNOSIS — Z7984 Long term (current) use of oral hypoglycemic drugs: Secondary | ICD-10-CM | POA: Diagnosis not present

## 2017-02-12 DIAGNOSIS — F17210 Nicotine dependence, cigarettes, uncomplicated: Secondary | ICD-10-CM | POA: Insufficient documentation

## 2017-02-12 DIAGNOSIS — E119 Type 2 diabetes mellitus without complications: Secondary | ICD-10-CM | POA: Insufficient documentation

## 2017-02-12 DIAGNOSIS — R791 Abnormal coagulation profile: Secondary | ICD-10-CM | POA: Insufficient documentation

## 2017-02-12 DIAGNOSIS — R531 Weakness: Secondary | ICD-10-CM

## 2017-02-12 LAB — COMPREHENSIVE METABOLIC PANEL
ALT: 16 U/L (ref 14–54)
AST: 18 U/L (ref 15–41)
Albumin: 3.8 g/dL (ref 3.5–5.0)
Alkaline Phosphatase: 81 U/L (ref 38–126)
Anion gap: 12 (ref 5–15)
BUN: 8 mg/dL (ref 6–20)
CO2: 23 mmol/L (ref 22–32)
Calcium: 9.2 mg/dL (ref 8.9–10.3)
Chloride: 106 mmol/L (ref 101–111)
Creatinine, Ser: 0.83 mg/dL (ref 0.44–1.00)
GFR calc Af Amer: >60 mL/min (ref >60)
GFR calc non Af Amer: >60 mL/min (ref >60)
Glucose, Bld: 109 mg/dL — ABNORMAL HIGH (ref 65–99)
Potassium: 3.7 mmol/L (ref 3.5–5.1)
Sodium: 141 mmol/L (ref 135–145)
Total Bilirubin: 0.7 mg/dL (ref 0.3–1.2)
Total Protein: 6.6 g/dL (ref 6.5–8.1)

## 2017-02-12 LAB — CBC
HCT: 41.5 % (ref 36.0–46.0)
Hemoglobin: 13.9 g/dL (ref 12.0–15.0)
MCH: 30.3 pg (ref 26.0–34.0)
MCHC: 33.5 g/dL (ref 30.0–36.0)
MCV: 90.6 fL (ref 78.0–100.0)
Platelets: 315 10*3/uL (ref 150–400)
RBC: 4.58 MIL/uL (ref 3.87–5.11)
RDW: 14.4 % (ref 11.5–15.5)
WBC: 7.9 10*3/uL (ref 4.0–10.5)

## 2017-02-12 LAB — I-STAT CHEM 8, ED
BUN: 7 mg/dL (ref 6–20)
Calcium, Ion: 1.06 mmol/L — ABNORMAL LOW (ref 1.15–1.40)
Chloride: 107 mmol/L (ref 101–111)
Creatinine, Ser: 0.8 mg/dL (ref 0.44–1.00)
Glucose, Bld: 107 mg/dL — ABNORMAL HIGH (ref 65–99)
HCT: 41 % (ref 36.0–46.0)
Hemoglobin: 13.9 g/dL (ref 12.0–15.0)
Potassium: 3.6 mmol/L (ref 3.5–5.1)
Sodium: 139 mmol/L (ref 135–145)
TCO2: 24 mmol/L (ref 0–100)

## 2017-02-12 LAB — DIFFERENTIAL
Basophils Absolute: 0.1 10*3/uL (ref 0.0–0.1)
Basophils Relative: 1 %
Eosinophils Absolute: 0.2 10*3/uL (ref 0.0–0.7)
Eosinophils Relative: 2 %
Lymphocytes Relative: 45 %
Lymphs Abs: 3.6 10*3/uL (ref 0.7–4.0)
Monocytes Absolute: 0.5 10*3/uL (ref 0.1–1.0)
Monocytes Relative: 6 %
Neutro Abs: 3.7 10*3/uL (ref 1.7–7.7)
Neutrophils Relative %: 46 %

## 2017-02-12 LAB — PROTIME-INR
INR: 0.97
Prothrombin Time: 12.9 seconds (ref 11.4–15.2)

## 2017-02-12 LAB — APTT: aPTT: 26 seconds (ref 24–36)

## 2017-02-12 LAB — I-STAT TROPONIN, ED: Troponin i, poc: 0 ng/mL (ref 0.00–0.08)

## 2017-02-12 MED ORDER — METOCLOPRAMIDE HCL 10 MG PO TABS
10.0000 mg | ORAL_TABLET | Freq: Once | ORAL | Status: AC
  Administered 2017-02-12: 10 mg via ORAL
  Filled 2017-02-12: qty 1

## 2017-02-12 NOTE — Code Documentation (Signed)
Patient states that she had acute onset of right leg weakness and blurry vision this morning.  After about an hour she called her primary care practice who recommended that she call 911.  EMS called Code stroke enroute.  Stat head CT and labs done.  Dr Shon Hale at bedside to assess patient.  NIHSS 4, she states that it is currently January and is doubtful when we tell her it is February.  Mild decreased sensory on right.  She is unable to hold leg off the bed for 5 sec, questionable effort.   Dr Shon Hale canceled code stroke at 1340.  Hand off with EDP and RN

## 2017-02-12 NOTE — Consult Note (Signed)
Neurology Consult Note  Reason for Consultation: CODE STROKE  Requesting provider: Activated by EMS; ED MD Wilson Singer  CC: R sided weakness and numbness  HPI: This is a 59-yo RH woman who presents via EMS as a CODE STROKE. History is obtained directly from the patient. I also discussed the case with the paramedics who brought her into the ED. I reviewed her electronic medical record at length.   I met the patient upon her arrival in the emergency department with the rest of the stroke team. She reports that this morning at about 11:30 she is trying to watch TV when she noticed that her vision was blurry. This was followed soon after by some numbness on the right side and, starting in her arm and radiating downward into her leg. She states that she was noting difficulty moving her right arm and leg as well. She states that she called her doctor and was told to call 911. The paramedic reports that during transport, the patient was unable to lift her right arm during the exam but could hold it up without any difficulty moving when she was measuring her blood pressure. She was taken for emergent CT scan of the head which was unremarkable. Her examination is notable for nonphysiologic weakness and sensory loss on the right side. Given presentation, code stroke was canceled.  No other symptoms are reported. Specifically, she denies any headache today. She reports that she is not under any more stress than usual today. She had a typical migraine headache yesterday and states that she got a shot in her hip yesterday morning with resolution of her head pain.   She had a similar episode of right-sided numbness and weakness in the setting of migraine headache in June 2017 for which she presented to the emergency department as a code stroke. At that time, she had nonphysiologic findings on examination raising concern for psychogenic etiology of symptoms that may have been embellished in the setting of complicated  migraine. She was treated for migraine and discharged home.  She presented to the ED on 04/27/2016 as a code stroke, again complaining of right arm and leg heaviness and numbness. She was seen by neurology who documented poor effort with variable drift and nonorganic features in right upper and lower extremities. She has subjectively decreased touch and pinprick on the right hemibody splitting at the midline. She also had splitting of vibration at midline on the forehead. It was felt that she may have come to the migraine versus conversion disorder.  The patient is presently being followed by Dr. Sarina Ill at [facility]. I have reviewed Dr. Cathren Laine notes at length. She is following the patient for history of intractable migraines for which she is getting Botox injections. She also carries history depression, anxiety, insomnia, back pain," significant psychiatric stressors." She last saw the patient on 09/25/16. At that time, her note indicates that her exams have not been consistent with organic etiology for the patient's symptoms. She documented poor effort with give way on the right side and midline splitting with vibration and pinprick. She presented on that day with acute onset of tremor and weakness. It was noted that she had been seeing multiple physicians and going to the hospital multiple times over the preceding few months for various symptoms. She had gone from walking unassisted to walking with a walker. She complained of generalized weakness and persistent tremors when watching TV. The documented neurologic examination is notable for chronic left ptosis. She was  noted to have some shuffling of her gait and walking independently but could keep up with the examiner when she took her hand and walk quickly up and him all way. No tremor was noted. She had give way weakness on strength examination with poor effort. She had midline splitting to pinprick and vibration. It is felt that  the patient had significant psychiatric occupation to her symptoms with significant embellishment. She recommended referral to psychiatry for management of depression and anxiety and consequent somatizations. The patient had EMG and nerve conduction studies performed on 11/01/16 and these were normal.    Last known well: 11:30 NHISS score: 5, though symptoms nonphysiologic in nature tPA given?: No, code stroke canceled   PMH:  Past Medical History:  Diagnosis Date  . Anxiety   . Bronchitis    uses inhaler if needed for bronchitis, lasted used -2014  . Cataract    bilateral  . Chronic headaches    migraines - in past, uses Phenergan for nausea   . Diabetes mellitus without complication ([place])    taken off Metformin since 04/2014, HgbA1C - normal, will follow up with PCP- Dr. Deborra Medina, 07/2014  . GERD (gastroesophageal reflux disease)   . H/O exercise stress test 2011   done at [facility]- told that it was WNL, done due to pt. having panic attacks   . History of blood transfusion 1966-09-02   at birth in [place], [place], unsure number of units  . Poor dentition    very poor oral health   . SVD (spontaneous vaginal delivery)    x 3  . Tobacco abuse     PSH:  Past Surgical History:  Procedure Laterality Date  . ABDOMINAL HYSTERECTOMY    . APPENDECTOMY  1988  . CARDIAC CATHETERIZATION N/A 08/17/2016   Procedure: Left Heart Cath and Coronary Angiography;  Surgeon: Burnell Blanks, MD;  Location: [facility];  Service: Cardiovascular;  Laterality: N/A;  . CHOLECYSTECTOMY N/A 06/07/2014   Procedure: LAPAROSCOPIC CHOLECYSTECTOMY;  Surgeon: Ralene Ok, MD;  Location: [facility];  Service: General;  Laterality: N/A;  . CHOLECYSTECTOMY  June 07 2014  . COLONOSCOPY  02/16/2014   normal   . ESOPHAGOGASTRODUODENOSCOPY  12/28/2013  . KNEE ARTHROSCOPY  1995   left  . LAPAROSCOPIC ASSISTED VAGINAL HYSTERECTOMY N/A 04/15/2014   Procedure: LAPAROSCOPIC ASSISTED VAGINAL HYSTERECTOMY;   Surgeon: Marylynn Pearson, MD;  Location: [facility];  Service: Gynecology;  Laterality: N/A;  . LAPAROSCOPIC BILATERAL SALPINGO OOPHERECTOMY Bilateral 04/15/2014   Procedure: LAPAROSCOPIC BILATERAL SALPINGO OOPHORECTOMY;  Surgeon: Marylynn Pearson, MD;  Location: [facility];  Service: Gynecology;  Laterality: Bilateral;  . TUBAL LIGATION      Family history: Family History  Problem Relation Age of Onset  . Diabetes Mother   . Hyperlipidemia Mother   . Hypertension Mother   . Prostate cancer Father   . Lung cancer Maternal Grandfather     smoked  . Emphysema Maternal Grandfather     smoked  . COPD Maternal Grandmother     never smoked   . Thyroid cancer Paternal Aunt   . Colon cancer Neg Hx   . Rectal cancer Neg Hx   . Stomach cancer Neg Hx   . Migraines Neg Hx   . Dementia Neg Hx     Social history:  She is married and lives with her husband. 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 She smokes one pack of cigarettes daily with a 35-pack-year history. She denies any  alcohol or illicit drug use. Her nose, she has 5 living children. One of her children is a "crack cocaine addict who often breaks into her house to steal money."   Current inpatient meds:  No current facility-administered medications for this encounter.    Current Outpatient Prescriptions  Medication Sig Dispense Refill  . amitriptyline (ELAVIL) 50 MG tablet Take 1-1/2 tablet at dinner time 45 tablet 3  . aspirin EC 81 MG tablet Take 81 mg by mouth daily.    . chlorpheniramine-HYDROcodone (TUSSIONEX PENNKINETIC ER) 10-8 MG/5ML SUER Take 5 mLs by mouth every 12 (twelve) hours as needed. 140 mL 0  . clonazePAM (KLONOPIN) 1 MG tablet Take 1 tablet (1 mg total) by mouth 3 (three) times daily as needed for anxiety. 90 tablet 0  . dicyclomine (BENTYL) 10 MG capsule TAKE ONE CAPSULE BY MOUTH TWICE DAILY AS NEEDED FOR  CRAMPING  AND  ABDOMINAL  PAIN 180 capsule 1  . docusate sodium (COLACE) 100 MG capsule Take 100 mg by mouth  at bedtime.    . famotidine (PEPCID) 20 MG tablet One at bedtime 30 tablet 11  . furosemide (LASIX) 40 MG tablet Take 1 tablet (40 mg total) by mouth daily. (Patient taking differently: Take 40 mg by mouth at bedtime. ) 30 tablet 6  . metFORMIN (GLUCOPHAGE) 500 MG tablet Take 1 tablet (500 mg total) by mouth daily with breakfast. 30 tablet 3  . omeprazole (PRILOSEC) 20 MG capsule Take 1 capsule (20 mg total) by mouth 2 (two) times daily. 180 capsule 1  . promethazine (PHENERGAN) 25 MG tablet Take 1 tablet (25 mg total) by mouth every 6 (six) hours as needed for nausea or vomiting. 30 tablet 0  . rizatriptan (MAXALT-MLT) 10 MG disintegrating tablet Take 1 tablet (10 mg total) by mouth as needed for migraine. May repeat in 2 hours if needed. Do not take more than 3 tablets in a week. 10 tablet 3  . traMADol (ULTRAM) 50 MG tablet 1-2 every 4 hours as needed for cough or pain 40 tablet 0  . traZODone (DESYREL) 50 MG tablet Take 3 tablets (150 mg total) by mouth at bedtime as needed for sleep. 30 tablet 3  . varenicline (CHANTIX STARTING MONTH PAK) 0.5 MG X 11 & 1 MG X 42 tablet Take one 0.5 mg tablet by mouth once daily for 3 days, then increase to one 0.5 mg tablet twice daily for 4 days, then increase to one 1 mg tablet twice daily. 53 tablet 0    Allergies: Allergies  Allergen Reactions  . Penicillins Anaphylaxis    Throat swells, Has patient had a PCN reaction causing immediate rash, facial/tongue/throat swelling, SOB or lightheadedness with hypotension: Yes Has patient had a PCN reaction causing severe rash involving mucus membranes or skin necrosis: No Has patient had a PCN reaction that required hospitalization No Has patient had a PCN reaction occurring within the last 10 years: No If all of the above answers are "NO", then may proceed with Cephalosporin use.   . Topamax [Topiramate] Other (See Comments)    Hands and feet to go numb    ROS: As per HPI. A full 14-point review of systems  was performed and is otherwise unremarkable.   PE:  BP 121/77   Pulse 69   Resp 11   Wt 74.8 kg (164 lb 14.5 oz)   LMP 02/12/2014   SpO2 100%   BMI 30.16 kg/m   General: WDWN, no acute distress. AAO to  all but the month. She stated it was January and one eye towards February she seems skeptical. Speech clear, no dysarthria. No aphasia. Follows commands briskly. Affect is flat with anxious and depressed mood.  HEENT: Normocephalic. Neck supple without LAD. MMM, OP clear. Dentition poor. Sclerae anicteric. No conjunctival injection.  CV: Regular, no murmur. Carotid pulses full and symmetric, no bruits. Distal pulses 2+ and symmetric.  Lungs: CTAB.  Abdomen: Soft, non-distended, non-tender. Bowel sounds present x4.  Extremities: No C/C/E. Neuro:  CN: Pupils are equal and round. They are symmetrically reactive from 3-->2 mm. EOMI without nystagmus. No reported diplopia. She reports decreased sensation over the right side rephased to the midline. She has weak closure of the right eye but absent Bell's phenomenon. Her face is symmetric at rest. Hearing is intact to conversational voice. Palate elevates symmetrically and uvula is midline. Voice is normal in tone, pitch and quality. Bilateral trapezii are 5/5. Left head turn is 5/5, right head turn 3/5. Tongue is midline with normal bulk and mobility.  Motor: Normal bulk, tone. He is poor effort with confrontational strength testing on the right side. She has significant giveaway weakness on the right. She has a positive Hoover's on the right. There is cocontraction of antagonist muscles on the right with variable weakness when the right side is tested along and when it is tested simultaneously with the left.. No tremor or other abnormal movements.  Sensation: She reports decreased light touch on the right side.  DTRs: 2+, symmetric. Toes downgoing bilaterally. No pathologic reflexes.  Coordination: Finger-to-nose and heel-to-shin are inconsistent on  the right.    Labs:  Lab Results  Component Value Date   WBC 7.9 02/12/2017   HGB 13.9 02/12/2017   HCT 41.0 02/12/2017   PLT 315 02/12/2017   GLUCOSE 107 (H) 02/12/2017   CHOL 166 12/19/2016   TRIG 116.0 12/19/2016   HDL 38.80 (L) 12/19/2016   LDLDIRECT 152.1 01/19/2013   LDLCALC 104 (H) 12/19/2016   ALT 16 02/12/2017   AST 18 02/12/2017   NA 139 02/12/2017   K 3.6 02/12/2017   CL 107 02/12/2017   CREATININE 0.80 02/12/2017   BUN 7 02/12/2017   CO2 23 02/12/2017   TSH 1.35 12/19/2016   INR 0.97 02/12/2017   HGBA1C 6.4 12/26/2016   MICROALBUR <0.7 12/26/2016    Imaging:  I have personally and independently reviewed CT scan of the head without contrast from today. This appears normal. This was discussed directly with the interpreting neuroradiologist at the time of the consult.  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 She's been evaluated by multiple neurologists for numerous different symptoms. All exams have documented nonorganic findings. Today's examination continues to show nonphysiologic deficits and I do not find any evidence to suggest that she is having an acute stroke or any other acute CNS pathology. Therefore, I do not recommend any further testing at this time. I would agree with her outpatient neurologist's recommendation for psychiatric referral. I discussed this with the patient. In fact, I had a lengthy conversation with her about somatization disorder and conversion disorder. She is adamant that she is not feeling stressed out so she is not convinced that this is her problem. She is concerned that despite all the testing she has had and all of the physicians who have seen her, something has been missed. She was once again reassured  that there is no evidence that she is having an acute neurologic event at this time. I've  recommended that she follow up with her outpatient neurologist as scheduled on 02/14/17. Again, she was strongly encouraged to pursue psychiatric consultation.  2. 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 Consider outpatient physical therapy if symptoms persist. Further recommendations as above.  3. Right hemisensory loss: Again, nonphysiologic on examination and consistent with somatizations disorder. Recommendations as above.  4. Migraine headache: She is presently being managed by Dr. Jaynee Eagles. No acute issues today. She is encouraged to follow up as scheduled on 02/14/17.  This was discussed with the patient. She was given the opportunity to ask any questions and these were addressed to the best of my ability.  I discussed my impression and findings with the ED attending, Dr. Wilson Singer.   At this time, I have no additional recommendations and will sign off. Please call with any additional questions or concerns.

## 2017-02-12 NOTE — Telephone Encounter (Signed)
Patient Name: Ashley [name]  DOB: 09-16-1966    Initial Comment Caller states that she is feeling lightheaded blurred vision in her right eye, right side numbness. blood sugar is normal and she says her blood pressure is within limits after a drop from 187/80   Nurse Assessment  Nurse: Leilani Merl, RN, Heather Date/Time (Eastern Time): 02/12/2017 12:31:21 PM  Confirm and document reason for call. If symptomatic, describe symptoms. ---Caller states that she is feeling lightheaded blurred vision in her right eye, right side numbness or her arm, her face feels "weird" and her leg feels asleep. blood sugar is normal and she says her blood pressure is within limits after a drop from 187/80 about an hour ago, BP now is 127/70  Does the patient have any new or worsening symptoms? ---Yes  Will a triage be completed? ---Yes  Related visit to physician within the last 2 weeks? ---No  Does the PT have any chronic conditions? (i.e. diabetes, asthma, etc.) ---Yes  List chronic conditions. ---See MR  Is the patient pregnant or possibly pregnant? (Ask all females between the ages of 52-55) ---No  Is this a behavioral health or substance abuse call? ---No     Guidelines    Guideline Title Affirmed Question Affirmed Notes  Neurologic Deficit [1] Numbness (i.e., loss of sensation) of the face, arm / hand, or leg / foot on one side of the body AND [2] sudden onset AND [3] present now    Final Disposition User   Call EMS 911 Now [place], RN, [facility] - ED   Disagree/Comply: Comply

## 2017-02-12 NOTE — ED Triage Notes (Signed)
Pt from home by [facility] for stroke like symptoms. 1130 today pt was having trouble seeing out of the right eye and having right sided weakness.

## 2017-02-12 NOTE — ED Notes (Signed)
Pt refused to sign the discharge papers. Pt states that she cannot move her right side. Pt stood and got into the car with no problems and lifted up her right leg.

## 2017-02-12 NOTE — Telephone Encounter (Signed)
Per chart review pt went to [facility] 

## 2017-02-12 NOTE — Telephone Encounter (Signed)
Patient called to let me know her results of injections. She stated that after her first injection she had significant improvement with only two to three head aches a month. She said with the second injection as it approaches her due date for the next injection she has been experiencing more head aches.

## 2017-02-12 NOTE — ED Provider Notes (Signed)
[facility] DEPT Provider Note   CSN: [id number] Arrival date & time: 02/12/17  1318   An emergency department physician performed an initial assessment on this suspected stroke patient at 1318.  History   Chief Complaint Chief Complaint  Patient presents with  . Code Stroke    HPI Ashley [name] is a 51 y.o. female.  Patient presents to the emergency department with chief complaint of right-sided extremity weakness and blurred vision in right eye. She states the symptoms started about 11:30 this morning. Code stroke was activated prior to arrival. Patient reports that she has had similar symptoms in the past, which have been attributed to complex migraines. She sees neurology on an outpatient basis. She denies any other associated symptoms. There are no modifying factors.   The history is provided by the patient. No language interpreter was used.    Past Medical History:  Diagnosis Date  . Anxiety   . Bronchitis    uses inhaler if needed for bronchitis, lasted used -2014  . Cataract    bilateral  . Chronic headaches    migraines - in past, uses Phenergan for nausea   . Diabetes mellitus without complication ([place])    taken off Metformin since 04/2014, HgbA1C - normal, will follow up with PCP- Dr. Deborra Medina, 07/2014  . GERD (gastroesophageal reflux disease)   . H/O exercise stress test 2011   done at [facility]- told that it was WNL, done due to pt. having panic attacks   . History of blood transfusion 01/04/1966   at birth in [place], [place], unsure number of units  . Poor dentition    very poor oral health   . SVD (spontaneous vaginal delivery)    x 3  . Tobacco abuse     Patient Active Problem List   Diagnosis Date Noted  . Cough 01/15/2017  . Tobacco abuse 01/08/2017  . Diabetes ([place]) 12/26/2016  . Well woman exam 12/13/2016  . Dyspnea 12/21/2015  . Chronic bronchitis ([place]) 10/20/2015  . HLD (hyperlipidemia) 04/12/2015  . Migraine with aura, intractable 08/12/2014  .  S/P hysterectomy with oophorectomy 04/15/2014  . Hyperglycemia 01/26/2013  . Migraine 05/30/2010  . Anxiety state 09/30/2009  . INSOMNIA, CHRONIC 09/30/2009  . ADJUSTMENT DISORDER WITH MIXED FEATURES 09/30/2009  . ELEVATED BLOOD PRESSURE WITHOUT DIAGNOSIS OF HYPERTENSION 09/30/2009    Past Surgical History:  Procedure Laterality Date  . ABDOMINAL HYSTERECTOMY    . APPENDECTOMY  1988  . CARDIAC CATHETERIZATION N/A 08/17/2016   Procedure: Left Heart Cath and Coronary Angiography;  Surgeon: Burnell Blanks, MD;  Location: [facility];  Service: Cardiovascular;  Laterality: N/A;  . CHOLECYSTECTOMY N/A 06/07/2014   Procedure: LAPAROSCOPIC CHOLECYSTECTOMY;  Surgeon: Ralene Ok, MD;  Location: [facility];  Service: General;  Laterality: N/A;  . CHOLECYSTECTOMY  June 07 2014  . COLONOSCOPY  02/16/2014   normal   . ESOPHAGOGASTRODUODENOSCOPY  12/28/2013  . KNEE ARTHROSCOPY  1995   left  . LAPAROSCOPIC ASSISTED VAGINAL HYSTERECTOMY N/A 04/15/2014   Procedure: LAPAROSCOPIC ASSISTED VAGINAL HYSTERECTOMY;  Surgeon: Marylynn Pearson, MD;  Location: [facility];  Service: Gynecology;  Laterality: N/A;  . LAPAROSCOPIC BILATERAL SALPINGO OOPHERECTOMY Bilateral 04/15/2014   Procedure: LAPAROSCOPIC BILATERAL SALPINGO OOPHORECTOMY;  Surgeon: Marylynn Pearson, MD;  Location: [facility];  Service: Gynecology;  Laterality: Bilateral;  . TUBAL LIGATION      OB History    No data available       Home Medications    Prior to Admission  medications   Medication Sig Start Date End Date Taking? Authorizing Provider  amitriptyline (ELAVIL) 50 MG tablet Take 1-1/2 tablet at dinner time 07/25/16   Lucille Passy, MD  aspirin EC 81 MG tablet Take 81 mg by mouth daily.    Historical Provider, MD  chlorpheniramine-HYDROcodone (TUSSIONEX PENNKINETIC ER) 10-8 MG/5ML SUER Take 5 mLs by mouth every 12 (twelve) hours as needed. 01/08/17   Lucille Passy, MD  clonazePAM (KLONOPIN) 1 MG tablet Take 1 tablet (1 mg total) by  mouth 3 (three) times daily as needed for anxiety. 12/26/16   Lucille Passy, MD  dicyclomine (BENTYL) 10 MG capsule TAKE ONE CAPSULE BY MOUTH TWICE DAILY AS NEEDED FOR  CRAMPING  AND  ABDOMINAL  PAIN 10/05/16   Lucille Passy, MD  docusate sodium (COLACE) 100 MG capsule Take 100 mg by mouth at bedtime.    Historical Provider, MD  famotidine (PEPCID) 20 MG tablet One at bedtime 08/29/16   Tanda Rockers, MD  furosemide (LASIX) 40 MG tablet Take 1 tablet (40 mg total) by mouth daily. Patient taking differently: Take 40 mg by mouth at bedtime.  08/10/16 11/08/16  Brittainy Erie Noe, PA-C  metFORMIN (GLUCOPHAGE) 500 MG tablet Take 1 tablet (500 mg total) by mouth daily with breakfast. 12/28/16   Lucille Passy, MD  omeprazole (PRILOSEC) 20 MG capsule Take 1 capsule (20 mg total) by mouth 2 (two) times daily. 09/20/16   Lucille Passy, MD  promethazine (PHENERGAN) 25 MG tablet Take 1 tablet (25 mg total) by mouth every 6 (six) hours as needed for nausea or vomiting. 12/26/16   Lucille Passy, MD  rizatriptan (MAXALT-MLT) 10 MG disintegrating tablet Take 1 tablet (10 mg total) by mouth as needed for migraine. May repeat in 2 hours if needed. Do not take more than 3 tablets in a week. 03/14/16   Pleas Koch, NP  traMADol Veatrice Bourbon) 50 MG tablet 1-2 every 4 hours as needed for cough or pain 01/15/17   Lucille Passy, MD  traZODone (DESYREL) 50 MG tablet Take 3 tablets (150 mg total) by mouth at bedtime as needed for sleep. 12/26/16   Lucille Passy, MD  varenicline (CHANTIX STARTING MONTH PAK) 0.5 MG X 11 & 1 MG X 42 tablet Take one 0.5 mg tablet by mouth once daily for 3 days, then increase to one 0.5 mg tablet twice daily for 4 days, then increase to one 1 mg tablet twice daily. 01/08/17   Lucille Passy, MD    Family History Family History  Problem Relation Age of Onset  . Diabetes Mother   . Hyperlipidemia Mother   . Hypertension Mother   . Prostate cancer Father   . Lung cancer Maternal Grandfather     smoked  .  Emphysema Maternal Grandfather     smoked  . COPD Maternal Grandmother     never smoked   . Thyroid cancer Paternal Aunt   . Colon cancer Neg Hx   . Rectal cancer Neg Hx   . Stomach cancer Neg Hx   . Migraines Neg Hx   . Dementia Neg Hx     Social History Social History  Substance Use Topics  . Smoking status: Current Every Day Smoker    Packs/day: 1.00    Years: 35.00    Types: Cigarettes, E-cigarettes  . Smokeless tobacco: Never Used  . Alcohol use No     Allergies   Penicillins and Topamax [topiramate]  Review of Systems Review of Systems  Eyes: Positive for visual disturbance.  Neurological: Positive for weakness.  All other systems reviewed and are negative.    Physical Exam Updated Vital Signs BP 116/79   Pulse 73   Resp 11   Wt 74.8 kg   LMP 02/12/2014   SpO2 98%   BMI 30.16 kg/m   Physical Exam  Constitutional: She is oriented to person, place, and time. She appears well-developed and well-nourished.  HENT:  Head: Normocephalic and atraumatic.  Eyes: Conjunctivae and EOM are normal. Pupils are equal, round, and reactive to light.  Neck: Normal range of motion. Neck supple.  Cardiovascular: Normal rate and regular rhythm.  Exam reveals no gallop and no friction rub.   No murmur heard. Pulmonary/Chest: Effort normal and breath sounds normal. No respiratory distress. She has no wheezes. She has no rales. She exhibits no tenderness.  Abdominal: Soft. Bowel sounds are normal. She exhibits no distension and no mass. There is no tenderness. There is no rebound and no guarding.  Musculoskeletal: Normal range of motion. She exhibits no edema or tenderness.  Neurological: She is alert and oriented to person, place, and time.  Patient halfway attempts to squeeze fingers on right grip strength test, left grip strength is 5/5, when right arm is lifted about face and released, she redirects so as to not hit herself CN 3-12 intact  Skin: Skin is warm and dry.    Psychiatric: She has a normal mood and affect. Her behavior is normal. Judgment and thought content normal.  Nursing note and vitals reviewed.    ED Treatments / Results  Labs (all labs ordered are listed, but only abnormal results are displayed) Labs Reviewed  COMPREHENSIVE METABOLIC PANEL - Abnormal; Notable for the following:       Result Value   Glucose, Bld 109 (*)    All other components within normal limits  I-STAT CHEM 8, ED - Abnormal; Notable for the following:    Glucose, Bld 107 (*)    Calcium, Ion 1.06 (*)    All other components within normal limits  PROTIME-INR  APTT  CBC  DIFFERENTIAL  I-STAT TROPOININ, ED  CBG MONITORING, ED    EKG  EKG Interpretation None       Radiology Ct Head Code Stroke W/o Cm  Addendum Date: 02/12/2017   ADDENDUM REPORT: 02/12/2017 13:58 ADDENDUM: Critical Value/emergent results were called by telephone at the time of interpretation on 02/12/2017 at 1:55 pm to Dr. Shon Hale, Neurology, who verbally acknowledged these results. Electronically Signed   By: Elon Alas M.D.   On: 02/12/2017 13:58   Result Date: 02/12/2017 CLINICAL DATA:  Code stroke. RIGHT-sided weakness, facial numbness. History of tobacco use, chronic headaches, diabetes, migraines, hyperlipidemia. EXAM: CT HEAD WITHOUT CONTRAST TECHNIQUE: Contiguous axial images were obtained from the base of the skull through the vertex without intravenous contrast. COMPARISON:  CT HEAD June 02, 2016 FINDINGS: BRAIN: The ventricles and sulci are normal. No intraparenchymal hemorrhage, mass effect nor midline shift. No acute large vascular territory infarcts. No abnormal extra-axial fluid collections. Basal cisterns are patent. VASCULAR: Unremarkable. SKULL/SOFT TISSUES: No skull fracture. No significant soft tissue swelling. ORBITS/SINUSES: The included ocular globes and orbital contents are normal.The mastoid aircells and included paranasal sinuses are well-aerated. OTHER: None. ASPECTS  [facility] Stroke Program Early CT Score) - Ganglionic level infarction (caudate, lentiform nuclei, internal capsule, insula, M1-M3 cortex): 7 - Supraganglionic infarction (M4-M6 cortex): 3 Total score (0-10 with 10 being normal): 10  IMPRESSION: 1. Normal CT HEAD. 2. ASPECTS is 10. Dr.Oster, neurology paged on February 20, 2017 at 1337 hours, awaiting return call. Electronically Signed: By: Elon Alas M.D. On: 02/12/2017 13:39    Procedures Procedures (including critical care time)  Medications Ordered in ED Medications  metoCLOPramide (REGLAN) tablet 10 mg (10 mg Oral Given 02/12/17 1418)     Initial Impression / Assessment and Plan / ED Course  I have reviewed the triage vital signs and the nursing notes.  Pertinent labs & imaging results that were available during my care of the patient were reviewed by me and considered in my medical decision making (see chart for details).     Code stroke activated PTA.  Patient with right sided weakness and blurred vision from right eye.  Patient seen immediately after CT by neurology, Dr. Shon Hale, who states that no acute stroke.  No TPA per his recommendations.  Dr. Shon Hale recommends discharge to home with outpatient psychology and neurology follow-up for somatization disorder.  Dr. Shon Hale recommends no further workup inpatient.  Based on my own physical exam and reassuring labs and imaging, I agree with Dr. Brock Bad treatment plan.  Patient states that she doesn't want to see a therapist, but is willing to see one if it will help.  Patient discussed with Dr. Wilson Singer, who agrees with the plan.  3:07 PM Patient states that her arm and leg still feel weak.  I am able to see her moving the extremities.  I offered that she could wait in the ED in a hallway room until she felt ready to go.  She declined this, and states "just get me a wheel chair and I'll go home."  I continued to reassure her that her testing today is normal and that the expert's  recommendation is for outpatient follow-up.   RN reports she was able to stand and enter the car and lift leg into car.  Final Clinical Impressions(s) / ED Diagnoses   Final diagnoses:  Right sided weakness    New Prescriptions New Prescriptions   No medications on file     Montine Circle, PA-C 02/12/17 [place], MD 02/25/17 1538

## 2017-02-12 NOTE — Telephone Encounter (Signed)
Jennifer/BC [phone] called says she needs to know how the pt did after the 2nd injection.

## 2017-02-13 ENCOUNTER — Other Ambulatory Visit: Payer: Self-pay

## 2017-02-13 ENCOUNTER — Other Ambulatory Visit: Payer: Self-pay | Admitting: Family Medicine

## 2017-02-13 MED ORDER — TRAZODONE HCL 150 MG PO TABS: 150.0000 mg | ORAL_TABLET | Freq: Every day | ORAL | 0 refills | Status: DC

## 2017-02-13 NOTE — Telephone Encounter (Signed)
Pt called to ck on trazodone refill; advised pt refill approved and pt will ck with pharmacy.

## 2017-02-14 ENCOUNTER — Other Ambulatory Visit: Payer: Self-pay

## 2017-02-14 ENCOUNTER — Ambulatory Visit (INDEPENDENT_AMBULATORY_CARE_PROVIDER_SITE_OTHER): Payer: BLUE CROSS/BLUE SHIELD | Admitting: Neurology

## 2017-02-14 ENCOUNTER — Telehealth: Payer: Self-pay | Admitting: Neurology

## 2017-02-14 VITALS — BP 124/65 | HR 92

## 2017-02-14 DIAGNOSIS — M5440 Lumbago with sciatica, unspecified side: Secondary | ICD-10-CM

## 2017-02-14 DIAGNOSIS — R159 Full incontinence of feces: Secondary | ICD-10-CM

## 2017-02-14 DIAGNOSIS — G8929 Other chronic pain: Secondary | ICD-10-CM

## 2017-02-14 DIAGNOSIS — G43711 Chronic migraine without aura, intractable, with status migrainosus: Secondary | ICD-10-CM | POA: Diagnosis not present

## 2017-02-14 DIAGNOSIS — N3945 Continuous leakage: Secondary | ICD-10-CM

## 2017-02-14 NOTE — Telephone Encounter (Signed)
Already taken care of yesterday

## 2017-02-14 NOTE — Telephone Encounter (Signed)
Please schedule 3 month botox per Dr Jaynee Eagles.

## 2017-02-14 NOTE — Progress Notes (Signed)
Consent Form Botulism Toxin Injection For Chronic Migraine   Botulism toxin has been approved by the Federal drug administration for treatment of chronic migraine. Botulism toxin does not cure chronic migraine and it may not be effective in some patients.  The administration of botulism toxin is accomplished by injecting a small amount of toxin into the muscles of the neck and head. Dosage must be titrated for each individual. Any benefits resulting from botulism toxin tend to wear off after 3 months with a repeat injection required if benefit is to be maintained. Injections are usually done every 3-4 months with maximum effect peak achieved by about 2 or 3 weeks. Botulism toxin is expensive and you should be sure of what costs you will incur resulting from the injection.  The side effects of botulism toxin use for chronic migraine may include:   -Transient, and usually mild, facial weakness with facial injections  -Transient, and usually mild, head or neck weakness with head/neck injections  -Reduction or loss of forehead facial animation due to forehead muscle              weakness  -Eyelid drooping  -Dry eye  -Pain at the site of injection or bruising at the site of injection  -Double vision  -Potential unknown long term risks  Contraindications: You should not have Botox if you are pregnant, nursing, allergic to albumin, have an infection, skin condition, or muscle weakness at the site of the injection, or have myasthenia gravis, Lambert-Eaton syndrome, or ALS.  It is also possible that as with any injection, there may be an allergic reaction or no effect from the medication. Reduced effectiveness after repeated injections is sometimes seen and rarely infection at the injection site may occur. All care will be taken to prevent these side effects. If therapy is given over a long time, atrophy and wasting in the muscle injected may occur. Occasionally the patient's become refractory to  treatment because they develop antibodies to the toxin. In this event, therapy needs to be modified.  I have read the above information and consent to the administration of botulism toxin.    ______________  _____   _________________  Patient signature     Date   Witness signature       BOTOX PROCEDURE NOTE FOR MIGRAINE HEADACHE    Contraindications and precautions discussed with patient(above). Aseptic procedure was observed and patient tolerated procedure. Procedure performed by Dr. Georgia Dom  The condition has existed for more than 6 months, and pt does not have a diagnosis of ALS, Myasthenia Gravis or Lambert-Eaton Syndrome. Risks and benefits of injections discussed and pt agrees to proceed with the procedure. Written consent obtained  These injections are medically necessary. He receives good benefits from these injections. These injections do not cause sedations or hallucinations which the oral therapies may cause.  Indication/Diagnosis: chronic migraine BOTOX(J0585) injection was performed according to protocol by [employer]. 200 units of BOTOX was dissolved into 4 cc NS.   Botox 200 units/ml x 1 vial from [facility] [phone] Lot [id number] 08 2020 Diluted w/ 4 ml Bacteriostatic 0.9% NaCl Lot [id number]  Description of procedure:  The patient was placed in a sitting position. The standard protocol was used for Botox as follows, with 5 units of Botox injected at each site:   -Procerus muscle, midline injection  -Corrugator muscle, bilateral injection  -Frontalis muscle, bilateral injection, with 2 sites each side, medial injection was performed in  the upper one third of the frontalis muscle, in the region vertical from the medial inferior edge of the superior orbital rim. The lateral injection was again in the upper one third of the forehead vertically above the lateral limbus of the cornea, 1.5 cm lateral to the medial  injection site.  -Temporalis muscle injection, 4 sites, bilaterally. The first injection was 3 cm above the tragus of the ear, second injection site was 1.5 cm to 3 cm up from the first injection site in line with the tragus of the ear. The third injection site was 1.5-3 cm forward between the first 2 injection sites. The fourth injection site was 1.5 cm posterior to the second injection site.  -Occipitalis muscle injection, 3 sites, bilaterally. The first injection was done one half way between the occipital protuberance and the tip of the mastoid process behind the ear. The second injection site was done lateral and superior to the first, 1 fingerbreadth from the first injection. The third injection site was 1 fingerbreadth superiorly and medially from the first injection site.  -Cervical paraspinal muscle injection, 2 sites, bilateral knee first injection site was 1 cm from the midline of the cervical spine, 3 cm inferior to the lower border of the occipital protuberance. The second injection site was 1.5 cm superiorly and laterally to the first injection site.  -Trapezius muscle injection was performed at 3 sites, bilaterally. The first injection site was in the upper trapezius muscle halfway between the inflection point of the neck, and the acromion. The second injection site was one half way between the acromion and the first injection site. The third injection was done between the first injection site and the inflection point of the neck.   Will return for repeat injection in 3 months.   A 200 unit sof Botox was used, 155 units were injected, the rest of the Botox was wasted. The patient tolerated the procedure well, there were no complications of the above procedure.  Today patient also reports urinary and bowel incontinence for 2 months, chronic low back pain and saddle anesthesia. I recommended ED, she declined. Will order an MRI lumbar spine for stenosis but recommended ED today for her  symptoms which could be dangerous.

## 2017-02-15 NOTE — Telephone Encounter (Signed)
I called and scheduled the patient for her next injection.  °

## 2017-02-16 ENCOUNTER — Encounter (HOSPITAL_COMMUNITY): Payer: Self-pay | Admitting: Emergency Medicine

## 2017-02-16 ENCOUNTER — Emergency Department (HOSPITAL_COMMUNITY)
Admission: EM | Admit: 2017-02-16 | Discharge: 2017-02-16 | Disposition: A | Discharge status: Home / Self Care | Payer: BLUE CROSS/BLUE SHIELD | Attending: Emergency Medicine | Admitting: Emergency Medicine

## 2017-02-16 DIAGNOSIS — Z7982 Long term (current) use of aspirin: Secondary | ICD-10-CM | POA: Diagnosis not present

## 2017-02-16 DIAGNOSIS — R200 Anesthesia of skin: Secondary | ICD-10-CM | POA: Diagnosis present

## 2017-02-16 DIAGNOSIS — Z79899 Other long term (current) drug therapy: Secondary | ICD-10-CM | POA: Diagnosis not present

## 2017-02-16 DIAGNOSIS — F17210 Nicotine dependence, cigarettes, uncomplicated: Secondary | ICD-10-CM | POA: Diagnosis not present

## 2017-02-16 DIAGNOSIS — E119 Type 2 diabetes mellitus without complications: Secondary | ICD-10-CM | POA: Insufficient documentation

## 2017-02-16 DIAGNOSIS — Z955 Presence of coronary angioplasty implant and graft: Secondary | ICD-10-CM | POA: Diagnosis not present

## 2017-02-16 DIAGNOSIS — R202 Paresthesia of skin: Secondary | ICD-10-CM | POA: Insufficient documentation

## 2017-02-16 DIAGNOSIS — Z7984 Long term (current) use of oral hypoglycemic drugs: Secondary | ICD-10-CM | POA: Insufficient documentation

## 2017-02-16 LAB — URINALYSIS, ROUTINE W REFLEX MICROSCOPIC
Bilirubin Urine: NEGATIVE
Glucose, UA: NEGATIVE mg/dL
Ketones, ur: NEGATIVE mg/dL
Leukocytes, UA: NEGATIVE
Nitrite: NEGATIVE
PROTEIN: NEGATIVE mg/dL
Specific Gravity, Urine: 1.008 (ref 1.005–1.030)
pH: 5 (ref 5.0–8.0)

## 2017-02-16 NOTE — ED Notes (Signed)
Reviewed symptoms with EDP-patient ok to return to lobby until acute room is ready

## 2017-02-16 NOTE — ED Notes (Signed)
Pt found in floor of bathroom.  Lying on buttocks with knees bent to left in side sitting position.  Pt states she tried to stand and became weak and went to her knees.  No obvious redness or injury noted.  MD notified.  Pt ambulated back to bathroom with minimal assist.

## 2017-02-16 NOTE — Discharge Instructions (Signed)
Follow-up for the lumbar MRI, 02/20/17 as scheduled.  Take all of your medications as directed.  Use your walker, when you get up to walk.

## 2017-02-16 NOTE — ED Triage Notes (Signed)
Per pt, states she was seen in the ED and worked up for stroke on the 20 th-states negative for stroke-states now having B/L extremity numbness below navel-states she is incontinent of urine and stool-states no pain just feels like needles and pins-ambulated to triage room with out assistance

## 2017-02-16 NOTE — ED Provider Notes (Signed)
[facility] DEPT Provider Note   CSN: [id number] Arrival date & time: 02/16/17  1438     History   Chief Complaint Chief Complaint  Patient presents with  . lower extremity numbness    HPI Ashley [name] is a 51 y.o. female.  She presents for evaluation of a sensation of pins and needles from her navel to her toes, bilaterally, starting 5 days ago and persistent.  This is accompanied by a numb sensation of both legs and feet.  She denies back pain.  She denies fever chills chest pain cough focal weakness or dizziness.  She has been nauseated and occasionally has trouble eating.  She presented to [facility] emergency department 4 days ago as a code stroke for right hemiplegia.  She was diagnosed with somatization and conversion disorder.  Patient was encouraged to follow-up with her neurologist as planned.  Patient also saw her neurologist, 2 days ago for Botox injections, and at that time discussed the urinary incontinence, and her neurologist ordered a lumbar MRI to be done on 02/20/2017.  Patient came in by private vehicle with her husband.  In the past, the patient has used a walker to help her ambulate.  She states that she stopped using it several months ago.  There are no other known modifying factors.  HPI  Past Medical History:  Diagnosis Date  . Anxiety   . Bronchitis    uses inhaler if needed for bronchitis, lasted used -2014  . Cataract    bilateral  . Chronic headaches    migraines - in past, uses Phenergan for nausea   . Diabetes mellitus without complication ([place])    taken off Metformin since 04/2014, HgbA1C - normal, will follow up with PCP- Dr. Deborra Medina, 07/2014  . GERD (gastroesophageal reflux disease)   . H/O exercise stress test 2011   done at [facility]- told that it was WNL, done due to pt. having panic attacks   . History of blood transfusion 12/10/66   at birth in [place], [place], unsure number of units  . Poor dentition    very poor oral health   . SVD  (spontaneous vaginal delivery)    x 3  . Tobacco abuse     Patient Active Problem List   Diagnosis Date Noted  . Cough 01/15/2017  . Tobacco abuse 01/08/2017  . Diabetes ([place]) 12/26/2016  . Well woman exam 12/13/2016  . Dyspnea 12/21/2015  . Chronic bronchitis ([place]) 10/20/2015  . HLD (hyperlipidemia) 04/12/2015  . Migraine with aura, intractable 08/12/2014  . S/P hysterectomy with oophorectomy 04/15/2014  . Hyperglycemia 01/26/2013  . Migraine 05/30/2010  . Anxiety state 09/30/2009  . INSOMNIA, CHRONIC 09/30/2009  . ADJUSTMENT DISORDER WITH MIXED FEATURES 09/30/2009  . ELEVATED BLOOD PRESSURE WITHOUT DIAGNOSIS OF HYPERTENSION 09/30/2009    Past Surgical History:  Procedure Laterality Date  . ABDOMINAL HYSTERECTOMY    . APPENDECTOMY  1988  . CARDIAC CATHETERIZATION N/A 08/17/2016   Procedure: Left Heart Cath and Coronary Angiography;  Surgeon: Burnell Blanks, MD;  Location: [facility];  Service: Cardiovascular;  Laterality: N/A;  . CHOLECYSTECTOMY N/A 06/07/2014   Procedure: LAPAROSCOPIC CHOLECYSTECTOMY;  Surgeon: Ralene Ok, MD;  Location: [facility];  Service: General;  Laterality: N/A;  . CHOLECYSTECTOMY  June 07 2014  . COLONOSCOPY  02/16/2014   normal   . ESOPHAGOGASTRODUODENOSCOPY  12/28/2013  . KNEE ARTHROSCOPY  1995   left  . LAPAROSCOPIC ASSISTED VAGINAL HYSTERECTOMY N/A 04/15/2014   Procedure: LAPAROSCOPIC  ASSISTED VAGINAL HYSTERECTOMY;  Surgeon: Marylynn Pearson, MD;  Location: [facility];  Service: Gynecology;  Laterality: N/A;  . LAPAROSCOPIC BILATERAL SALPINGO OOPHERECTOMY Bilateral 04/15/2014   Procedure: LAPAROSCOPIC BILATERAL SALPINGO OOPHORECTOMY;  Surgeon: Marylynn Pearson, MD;  Location: [facility];  Service: Gynecology;  Laterality: Bilateral;  . TUBAL LIGATION      OB History    No data available       Home Medications    Prior to Admission medications   Medication Sig Start Date End Date Taking? Authorizing Provider  amitriptyline  (ELAVIL) 50 MG tablet Take 1-1/2 tablet at dinner time 07/25/16  Yes Lucille Passy, MD  aspirin EC 81 MG tablet Take 81 mg by mouth daily.   Yes Historical Provider, MD  clonazePAM (KLONOPIN) 1 MG tablet Take 1 tablet (1 mg total) by mouth 3 (three) times daily as needed for anxiety. 12/26/16  Yes Lucille Passy, MD  dicyclomine (BENTYL) 10 MG capsule TAKE ONE CAPSULE BY MOUTH TWICE DAILY AS NEEDED FOR  CRAMPING  AND  ABDOMINAL  PAIN 10/05/16  Yes Lucille Passy, MD  docusate sodium (COLACE) 100 MG capsule Take 100 mg by mouth at bedtime.   Yes Historical Provider, MD  famotidine (PEPCID) 20 MG tablet One at bedtime Patient taking differently: Take 20 mg by mouth at bedtime. One at bedtime 08/29/16  Yes Tanda Rockers, MD  furosemide (LASIX) 40 MG tablet Take 1 tablet (40 mg total) by mouth daily. Patient taking differently: Take 40 mg by mouth at bedtime.  08/10/16 02/16/17 Yes Brittainy Erie Noe, PA-C  metFORMIN (GLUCOPHAGE) 500 MG tablet Take 1 tablet (500 mg total) by mouth daily with breakfast. 12/28/16  Yes Lucille Passy, MD  omeprazole (PRILOSEC) 20 MG capsule Take 1 capsule (20 mg total) by mouth 2 (two) times daily. 09/20/16  Yes Lucille Passy, MD  promethazine (PHENERGAN) 25 MG tablet Take 1 tablet (25 mg total) by mouth every 6 (six) hours as needed for nausea or vomiting. 12/26/16  Yes Lucille Passy, MD  rizatriptan (MAXALT-MLT) 10 MG disintegrating tablet Take 1 tablet (10 mg total) by mouth as needed for migraine. May repeat in 2 hours if needed. Do not take more than 3 tablets in a week. 03/14/16  Yes Pleas Koch, NP  traZODone (DESYREL) 150 MG tablet Take 1 tablet (150 mg total) by mouth at bedtime. 02/13/17  Yes Lucille Passy, MD  varenicline (CHANTIX STARTING MONTH PAK) 0.5 MG X 11 & 1 MG X 42 tablet Take one 0.5 mg tablet by mouth once daily for 3 days, then increase to one 0.5 mg tablet twice daily for 4 days, then increase to one 1 mg tablet twice daily. Patient taking differently: Take 1 mg by  mouth 2 (two) times daily.  01/08/17  Yes Lucille Passy, MD  traMADol Veatrice Bourbon) 50 MG tablet 1-2 every 4 hours as needed for cough or pain Patient not taking: Reported on 02/14/2017 01/15/17   Lucille Passy, MD    Family History Family History  Problem Relation Age of Onset  . Diabetes Mother   . Hyperlipidemia Mother   . Hypertension Mother   . Prostate cancer Father   . Lung cancer Maternal Grandfather     smoked  . Emphysema Maternal Grandfather     smoked  . COPD Maternal Grandmother     never smoked   . Thyroid cancer Paternal Aunt   . Colon cancer Neg Hx   . Rectal  cancer Neg Hx   . Stomach cancer Neg Hx   . Migraines Neg Hx   . Dementia Neg Hx     Social History Social History  Substance Use Topics  . Smoking status: Current Every Day Smoker    Packs/day: 1.00    Years: 35.00    Types: Cigarettes, E-cigarettes  . Smokeless tobacco: Never Used  . Alcohol use No     Allergies   Penicillins and Topamax [topiramate]   Review of Systems Review of Systems  All other systems reviewed and are negative.    Physical Exam Updated Vital Signs BP 101/57 (BP Location: Left Arm)   Pulse 68   Resp 16   LMP 02/12/2014   SpO2 94%   Physical Exam  Constitutional: She is oriented to person, place, and time. She appears well-developed and well-nourished. No distress.  HENT:  Head: Normocephalic and atraumatic.  Eyes: Conjunctivae and EOM are normal. Pupils are equal, round, and reactive to light.  Neck: Normal range of motion and phonation normal. Neck supple.  Cardiovascular: Normal rate and regular rhythm.   Pulmonary/Chest: Effort normal and breath sounds normal. She exhibits no tenderness.  Abdominal: Soft. She exhibits no distension. There is no tenderness. There is no guarding.  Genitourinary:  Genitourinary Comments: Anal exam done to assess sphincter tone.  The sphincter tone is normal.  Musculoskeletal: Normal range of motion.  Normal strength arms and legs  bilaterally  Neurological: She is alert and oriented to person, place, and time. She exhibits normal muscle tone.  No dysarthria or aphasia or nystagmus.  Normal deep tendon reflexes elbows and knees bilaterally.  Decreased light touch sensation in feet and legs bilaterally.  Decreased light touch sensation in the buttocks bilaterally.  Decreased sensation in the perineum.  Normal anal sphincter tone  Skin: Skin is warm and dry.  Psychiatric: She has a normal mood and affect. Her behavior is normal. Judgment and thought content normal.  Nursing note and vitals reviewed.    ED Treatments / Results  Labs (all labs ordered are listed, but only abnormal results are displayed) Labs Reviewed  URINALYSIS, ROUTINE W REFLEX MICROSCOPIC - Abnormal; Notable for the following:       Result Value   Hgb urine dipstick SMALL (*)    Bacteria, UA RARE (*)    Squamous Epithelial / LPF 0-5 (*)    All other components within normal limits    EKG  EKG Interpretation None       Radiology No results found.  Procedures Procedures (including critical care time)  Medications Ordered in ED Medications - No data to display   Initial Impression / Assessment and Plan / ED Course  I have reviewed the triage vital signs and the nursing notes.  Pertinent labs & imaging results that were available during my care of the patient were reviewed by me and considered in my medical decision making (see chart for details).  Clinical Course as of Feb 16 1933  Sat Feb 16, 2017  1806 Patient reports a non-witnessed fall while she was obtaining a urine sample.  Nursing found the patient on her hands and knees in the bathroom.  There were no visible injuries.  [EW]    Clinical Course User Index [EW] Daleen Bo, MD    Medications - No data to display  Patient Vitals for the past 24 hrs:  BP Pulse Resp SpO2  02/16/17 1838 101/57 68 16 94 %    7:30 PM Reevaluation with  update and discussion. After initial  assessment and treatment, an updated evaluation reveals patient is fully closed sitting on the stretcher, angry.  I informed her that her urine sample was normal therefore there was no sign of infection.  I informed her that there was no sign of acute spine or nerve conditions that would require her to stay here or have additional imaging.  She asked what she should do about walking and I informed her that she should use her walker.  Patient stated that she would not sign discharge forms and wanted to be taken out in a wheelchair.Daleen Bo L    Final Clinical Impressions(s) / ED Diagnoses   Final diagnoses:  Paresthesias    Nonspecific paresthesias, and patient with previously diagnosed conversion disorder and somatization.  Doubt spinal myelopathy at this time.  No evidence for urinary tract infection.  Doubt suspect infection or metabolic instability or impending vascular collapse.  Patient has previously been referred to psychiatry and has chosen to not go.  Nursing Notes Reviewed/ Care Coordinated Applicable Imaging Reviewed Interpretation of Laboratory Data incorporated into ED treatment  The patient appears reasonably screened and/or stabilized for discharge and I doubt any other medical condition or other [name] requiring further screening, evaluation, or treatment in the ED at this time prior to discharge.  Plan: Home Medications- continue; Home Treatments- rest; return here if the recommended treatment, does not improve the symptoms; Recommended follow up- PCP prn    New Prescriptions New Prescriptions   No medications on file     Daleen Bo, MD 02/16/17 1935

## 2017-02-16 NOTE — ED Notes (Signed)
Went to discharge patient and educate on followup/ home care. Pt is not in room. Pt has left-ambulatory at discharge. Unable to obtain discharge vitals or signature. Pt AOx4. Left with family.

## 2017-02-16 NOTE — ED Notes (Signed)
Pt ambulated to bathroom with minimal assist 

## 2017-02-18 ENCOUNTER — Telehealth: Payer: Self-pay

## 2017-02-18 ENCOUNTER — Ambulatory Visit: Payer: BLUE CROSS/BLUE SHIELD | Admitting: Family Medicine

## 2017-02-18 NOTE — Telephone Encounter (Signed)
PLEASE NOTE: All timestamps contained within this report are represented as [place] Standard Time. CONFIDENTIALTY NOTICE: This fax transmission is intended only for the addressee. It contains information that is legally privileged, confidential or otherwise protected from use or disclosure. If you are not the intended recipient, you are strictly prohibited from reviewing, disclosing, copying using or disseminating any of this information or taking any action in reliance on or regarding this information. If you have received this fax in error, please notify us immediately by telephone so that we can arrange for its return to [place]. Phone: [phone], Toll-Free: [phone], Fax: [phone] Page: 1 of 2 Call Id: [id number] [place] Patient Name: Ashley [name] Gender: Female DOB: 1966-10-09 Age: 51 Y 51 M 3 D Return Phone Number: [phone] (Primary) Address: [address] City/State/Zip: [place] [address] [facility] - Client Client Site [facility] - Day Physician Arnette Norris - MD Who Is Calling Patient / Member / Family / Caregiver Call Type Triage / Clinical Relationship To Patient Self Return Phone Number [phone] (Primary) Chief Complaint NUMBNESS/TINGLING- sudden on one side of the body or face Reason for Call Symptomatic / Request for [place] went to er Tuesday with stroke like symptoms. States now from her navel down it feels like it is really tingly and states she is not in control of when she needs to poop or urinate. Appointment Disposition EMR Appointment Not Necessary Info pasted into Epic No Nurse Assessment Nurse: Markus Daft, RN, Sherre Poot Date/Time Eilene Ghazi Time): 02/16/2017 1:48:23 PM Confirm and document reason for call. If symptomatic, describe symptoms. ---Caller went to ER on  Tuesday with stroke like symptoms - right side was affected, couldn't walk correctly. States now from her navel down both her legs, it feels like it is really tingly/ painful, and states she is not in control of when she needs to poop or urinate. Does the PT have any chronic conditions? (i.e. diabetes, asthma, etc.) ---Yes List chronic conditions. ---diabetes Is the patient pregnant or possibly pregnant? (Ask all females between the ages of 71-55) ---No Guidelines Guideline Title Affirmed Question Neurologic Deficit [1] Loss of control of bowel or bladder (i.e., incontinence) AND [2] new onset Disp. Time Eilene Ghazi Time) Disposition Final User 02/16/2017 1:52:04 PM Go to ED Now Yes Markus Daft, RN, [place] - ED Care Advice Given Per Guideline GO TO ED NOW: You need to be seen in the Emergency Department. Go to the ER at ___________ [place] now. Drive carefully. DRIVING: Another adult should drive. CARE ADVICE given per Neurologic Deficit (Adult) guideline. PLEASE NOTE: All timestamps contained within this report are represented as [place] Standard Time. CONFIDENTIALTY NOTICE: This fax transmission is intended only for the addressee. It contains information that is legally privileged, confidential or otherwise protected from use or disclosure. If you are not the intended recipient, you are strictly prohibited from reviewing, disclosing, copying using or disseminating any of this information or taking any action in reliance on or regarding this information. If you have received this fax in error, please notify us immediately by telephone so that we can arrange for its return to [place]. Phone: [phone], Toll-Free: [phone], Fax: [phone] Page: 2 of 2 Call Id: [id number]

## 2017-02-18 NOTE — Telephone Encounter (Signed)
Per chart review pt was seen [facility] ED and pt has f/u appt on 02/19/17 with Dr Deborra Medina.

## 2017-02-19 ENCOUNTER — Ambulatory Visit (INDEPENDENT_AMBULATORY_CARE_PROVIDER_SITE_OTHER): Payer: BLUE CROSS/BLUE SHIELD | Admitting: Family Medicine

## 2017-02-19 ENCOUNTER — Encounter: Payer: Self-pay | Admitting: Family Medicine

## 2017-02-19 VITALS — BP 106/84 | HR 74 | Temp 98.1°F | Wt 160.0 lb

## 2017-02-19 DIAGNOSIS — R2990 Unspecified symptoms and signs involving the nervous system: Secondary | ICD-10-CM | POA: Insufficient documentation

## 2017-02-19 DIAGNOSIS — Z09 Encounter for follow-up examination after completed treatment for conditions other than malignant neoplasm: Secondary | ICD-10-CM | POA: Diagnosis not present

## 2017-02-19 NOTE — Progress Notes (Signed)
Subjective:   Patient ID: Ashley [name], female    DOB: 07-15-1966, 51 y.o.   MRN: [id number]  Ashley [name] is a pleasant 51 y.o. year old female who presents to clinic today with ER Follow-up (Was seen at [facility] and [facility] last week for Stroke like symptoms.)  on 02/19/2017  HPI:  Martin Majestic to [facility] on 02/12/2017 for right sided numbness/heaviness and code stroke was called.  Notes reviewed-   Head CT was done which was neg.   Felt symptoms were psychogenic.  Discharged home.  This weekend- had numbness from waist down and lost control of her bladder and bowels. Went to [employer]- fell on the floor while leaving a urine sample. Per pt, doctor told her UA was negative was told rectal tone normal. Per pt, was then told to go home and she left AMA prior to being discharged.  Has appointment for MRI scheduled by her neurologist, Dr. Jaynee Eagles  Mm Diag Breast Tomo Bilateral  Result Date: 01/24/2017 CLINICAL DATA:  Patient describes diffuse pain in the lateral aspect of both breasts.Status post hysterectomy. Patient is having hot flashes. EXAM: 2D DIGITAL DIAGNOSTIC BILATERAL MAMMOGRAM WITH CAD AND ADJUNCT TOMO COMPARISON:  05/12/2015 and earlier ACR Breast Density Category c: The breast tissue is heterogeneously dense, which may obscure small masses. FINDINGS: No suspicious mass, distortion, or microcalcifications are identified to suggest presence of malignancy. Mammographic images were processed with CAD. IMPRESSION: No mammographic evidence for malignancy. RECOMMENDATION: Screening mammogram in one year.(Code:SM-B-01Y) I have discussed the findings and recommendations with the patient. Results were also provided in writing at the conclusion of the visit. If applicable, a reminder letter will be sent to the patient regarding the next appointment. BI-RADS CATEGORY  1: Negative. Electronically Signed   By: Nolon Nations M.D.   On: 01/24/2017 10:36   Ct Head Code Stroke W/o Cm  Addendum Date: 02/12/2017     ADDENDUM REPORT: 02/12/2017 13:58 ADDENDUM: Critical Value/emergent results were called by telephone at the time of interpretation on 02/12/2017 at 1:55 pm to Dr. Shon Hale, Neurology, who verbally acknowledged these results. Electronically Signed   By: Elon Alas M.D.   On: 02/12/2017 13:58   Result Date: 02/12/2017 CLINICAL DATA:  Code stroke. RIGHT-sided weakness, facial numbness. History of tobacco use, chronic headaches, diabetes, migraines, hyperlipidemia. EXAM: CT HEAD WITHOUT CONTRAST TECHNIQUE: Contiguous axial images were obtained from the base of the skull through the vertex without intravenous contrast. COMPARISON:  CT HEAD June 02, 2016 FINDINGS: BRAIN: The ventricles and sulci are normal. No intraparenchymal hemorrhage, mass effect nor midline shift. No acute large vascular territory infarcts. No abnormal extra-axial fluid collections. Basal cisterns are patent. VASCULAR: Unremarkable. SKULL/SOFT TISSUES: No skull fracture. No significant soft tissue swelling. ORBITS/SINUSES: The included ocular globes and orbital contents are normal.The mastoid aircells and included paranasal sinuses are well-aerated. OTHER: None. ASPECTS [facility] Stroke Program Early CT Score) - Ganglionic level infarction (caudate, lentiform nuclei, internal capsule, insula, M1-M3 cortex): 7 - Supraganglionic infarction (M4-M6 cortex): 3 Total score (0-10 with 10 being normal): 10 IMPRESSION: 1. Normal CT HEAD. 2. ASPECTS is 10. Dr.Oster, neurology paged on February 20, 2017 at 1337 hours, awaiting return call. Electronically Signed: By: Elon Alas M.D. On: 02/12/2017 13:39      Current Outpatient Prescriptions on File Prior to Visit  Medication Sig Dispense Refill  . amitriptyline (ELAVIL) 50 MG tablet Take 1-1/2 tablet at dinner time 45 tablet 3  . aspirin EC 81 MG tablet Take 81  mg by mouth daily.    . clonazePAM (KLONOPIN) 1 MG tablet Take 1 tablet (1 mg total) by mouth 3 (three) times daily as needed for  anxiety. 90 tablet 0  . dicyclomine (BENTYL) 10 MG capsule TAKE ONE CAPSULE BY MOUTH TWICE DAILY AS NEEDED FOR  CRAMPING  AND  ABDOMINAL  PAIN 180 capsule 1  . docusate sodium (COLACE) 100 MG capsule Take 100 mg by mouth at bedtime.    . famotidine (PEPCID) 20 MG tablet One at bedtime (Patient taking differently: Take 20 mg by mouth at bedtime. One at bedtime) 30 tablet 11  . metFORMIN (GLUCOPHAGE) 500 MG tablet Take 1 tablet (500 mg total) by mouth daily with breakfast. 30 tablet 3  . omeprazole (PRILOSEC) 20 MG capsule Take 1 capsule (20 mg total) by mouth 2 (two) times daily. 180 capsule 1  . promethazine (PHENERGAN) 25 MG tablet Take 1 tablet (25 mg total) by mouth every 6 (six) hours as needed for nausea or vomiting. 30 tablet 0  . rizatriptan (MAXALT-MLT) 10 MG disintegrating tablet Take 1 tablet (10 mg total) by mouth as needed for migraine. May repeat in 2 hours if needed. Do not take more than 3 tablets in a week. 10 tablet 3  . traZODone (DESYREL) 150 MG tablet Take 1 tablet (150 mg total) by mouth at bedtime. 90 tablet 0  . varenicline (CHANTIX STARTING MONTH PAK) 0.5 MG X 11 & 1 MG X 42 tablet Take one 0.5 mg tablet by mouth once daily for 3 days, then increase to one 0.5 mg tablet twice daily for 4 days, then increase to one 1 mg tablet twice daily. (Patient taking differently: Take 1 mg by mouth 2 (two) times daily. ) 53 tablet 0  . furosemide (LASIX) 40 MG tablet Take 1 tablet (40 mg total) by mouth daily. (Patient taking differently: Take 40 mg by mouth at bedtime. ) 30 tablet 6  . traMADol (ULTRAM) 50 MG tablet 1-2 every 4 hours as needed for cough or pain (Patient not taking: Reported on 02/14/2017) 40 tablet 0   No current facility-administered medications on file prior to visit.     Allergies  Allergen Reactions  . Penicillins Anaphylaxis    Throat swells, Has patient had a PCN reaction causing immediate rash, facial/tongue/throat swelling, SOB or lightheadedness with  hypotension: Yes Has patient had a PCN reaction causing severe rash involving mucus membranes or skin necrosis: No Has patient had a PCN reaction that required hospitalization No Has patient had a PCN reaction occurring within the last 10 years: No If all of the above answers are "NO", then may proceed with Cephalosporin use.   . Topamax [Topiramate] Other (See Comments)    Hands and feet to go numb    Past Medical History:  Diagnosis Date  . Anxiety   . Bronchitis    uses inhaler if needed for bronchitis, lasted used -2014  . Cataract    bilateral  . Chronic headaches    migraines - in past, uses Phenergan for nausea   . Diabetes mellitus without complication ([place])    taken off Metformin since 04/2014, HgbA1C - normal, will follow up with PCP- Dr. Deborra Medina, 07/2014  . GERD (gastroesophageal reflux disease)   . H/O exercise stress test 2011   done at [facility]- told that it was WNL, done due to pt. having panic attacks   . History of blood transfusion [birth year] 1966   at birth in [place], [place], unsure number of  units  . Poor dentition    very poor oral health   . SVD (spontaneous vaginal delivery)    x 3  . Tobacco abuse     Past Surgical History:  Procedure Laterality Date  . ABDOMINAL HYSTERECTOMY    . APPENDECTOMY  1988  . CARDIAC CATHETERIZATION N/A 08/17/2016   Procedure: Left Heart Cath and Coronary Angiography;  Surgeon: Burnell Blanks, MD;  Location: [place] [facility];  Service: Cardiovascular;  Laterality: N/A;  . CHOLECYSTECTOMY N/A 06/07/2014   Procedure: LAPAROSCOPIC CHOLECYSTECTOMY;  Surgeon: Ralene Ok, MD;  Location: [facility];  Service: General;  Laterality: N/A;  . CHOLECYSTECTOMY  June 07 2014  . COLONOSCOPY  02/16/2014   normal   . ESOPHAGOGASTRODUODENOSCOPY  12/28/2013  . KNEE ARTHROSCOPY  1995   left  . LAPAROSCOPIC ASSISTED VAGINAL HYSTERECTOMY N/A 04/15/2014   Procedure: LAPAROSCOPIC ASSISTED VAGINAL HYSTERECTOMY;  Surgeon: Marylynn Pearson, MD;   Location: [facility];  Service: Gynecology;  Laterality: N/A;  . LAPAROSCOPIC BILATERAL SALPINGO OOPHERECTOMY Bilateral 04/15/2014   Procedure: LAPAROSCOPIC BILATERAL SALPINGO OOPHORECTOMY;  Surgeon: Marylynn Pearson, MD;  Location: [facility];  Service: Gynecology;  Laterality: Bilateral;  . TUBAL LIGATION      Family History  Problem Relation Age of Onset  . Diabetes Mother   . Hyperlipidemia Mother   . Hypertension Mother   . Prostate cancer Father   . Lung cancer Maternal Grandfather     smoked  . Emphysema Maternal Grandfather     smoked  . COPD Maternal Grandmother     never smoked   . Thyroid cancer Paternal Aunt   . Colon cancer Neg Hx   . Rectal cancer Neg Hx   . Stomach cancer Neg Hx   . Migraines Neg Hx   . Dementia Neg Hx     Social History   Social History  . Marital status: Married    Spouse name: Alvester Chou  . Number of children: 6  . Years of education: 18   Occupational History  .       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 Smoking status: Current Every Day Smoker    Packs/day: 1.00    Years: 35.00    Types: Cigarettes, E-cigarettes  . Smokeless tobacco: Never Used  . Alcohol use No  . Drug use: No  . Sexual activity: Yes    Birth control/ protection: Pill   Other Topics Concern  . Not on file   Social History Narrative   5 living children, one child is a crack cocaine addict who often breaks into their house to steal money. One child died of cerebral palsy.   Caffeine use: Dr Malachi Bonds (3 per day)   2 cups coffee per day   The PMH, PSH, Social History, Family History, Medications, and allergies have been reviewed in [facility], and have been updated if relevant.   Review of Systems  Genitourinary:       + urinary and fecal incontinence  Neurological: Positive for weakness, numbness and headaches. Negative for dizziness, tremors, seizures, syncope, facial asymmetry and speech difficulty.  Psychiatric/Behavioral: Negative for confusion,  hallucinations, self-injury, sleep disturbance and suicidal ideas. The patient is not nervous/anxious and is not hyperactive.   All other systems reviewed and are negative.      Objective:    BP 106/84 (BP Location: Left Arm, Patient Position: Sitting, Cuff Size: Normal)   Pulse 74   Temp 98.1 F (36.7 C) (Oral)  Wt 160 lb (72.6 kg)   LMP 02/12/2014   SpO2 95%   BMI 29.26 kg/m    Physical Exam  Constitutional: She appears well-developed and well-nourished. No distress.  HENT:  Head: Normocephalic and atraumatic.  Eyes: Conjunctivae are normal.  Cardiovascular: Normal rate.   Pulmonary/Chest: Effort normal.  Musculoskeletal: Normal range of motion.  Neurological: She is alert. She has normal reflexes. No cranial nerve deficit or sensory deficit.  Decreased grip strength right, decreased strength right lower extremity as well.   Skin: Skin is warm and dry. She is not diaphoretic.  Psychiatric: She has a normal mood and affect. Her behavior is normal. Judgment and thought content normal.  Nursing note and vitals reviewed.         Assessment & Plan:   Hospital discharge follow-up  Stroke-like symptom No Follow-up on file.

## 2017-02-19 NOTE — Progress Notes (Signed)
Pre visit review using our clinic review tool, if applicable. No additional management support is needed unless otherwise documented below in the visit note. 

## 2017-02-19 NOTE — Assessment & Plan Note (Signed)
Etiology unclear, she does have decreased grip strength and leg strength on the right. Normal reflexes. ER ? Psychogenic- pt agrees to psychiatry referral. Keep appt for MRI tomorrow and pt will make appointment with Dr. Jaynee Eagles. The patient indicates understanding of these issues and agrees with the plan.

## 2017-02-20 ENCOUNTER — Ambulatory Visit (INDEPENDENT_AMBULATORY_CARE_PROVIDER_SITE_OTHER): Payer: BLUE CROSS/BLUE SHIELD

## 2017-02-20 ENCOUNTER — Telehealth: Payer: Self-pay | Admitting: Neurology

## 2017-02-20 DIAGNOSIS — G8929 Other chronic pain: Secondary | ICD-10-CM

## 2017-02-20 DIAGNOSIS — N3945 Continuous leakage: Secondary | ICD-10-CM | POA: Diagnosis not present

## 2017-02-20 DIAGNOSIS — M5440 Lumbago with sciatica, unspecified side: Secondary | ICD-10-CM

## 2017-02-20 DIAGNOSIS — R159 Full incontinence of feces: Secondary | ICD-10-CM | POA: Diagnosis not present

## 2017-02-20 NOTE — Telephone Encounter (Signed)
I have order an MRI of the lumbar spine for patient. I do believe this is psychogenic. Raquel Sarna has she scheduled the MRI? Can set her up with follow up with carolyn after MRI lumbar spine.  Thanks

## 2017-02-20 NOTE — Telephone Encounter (Signed)
Pt went to ED [place] and [facility] twice last week. Pt states her PCP said to fu with Jaynee Eagles since she is her neurologist. [place]. Numb from [employer] down. Lost control of bowels and urine. Please call pt to work in

## 2017-02-20 NOTE — Telephone Encounter (Signed)
She had a MRI Lumbar today at our [place] mobile unit this morning.Marland Kitchen

## 2017-02-21 NOTE — Telephone Encounter (Signed)
Spoke with Dr. Jaynee Eagles.   Will hold off on making appt with CM/NP, until after MRI resulted.

## 2017-02-25 ENCOUNTER — Other Ambulatory Visit: Payer: Self-pay | Admitting: Family Medicine

## 2017-02-25 ENCOUNTER — Other Ambulatory Visit: Payer: Self-pay | Admitting: *Deleted

## 2017-02-25 MED ORDER — CLONAZEPAM 1 MG PO TABS: 1.0000 mg | ORAL_TABLET | Freq: Three times a day (TID) | ORAL | 0 refills | Status: DC | PRN

## 2017-02-25 NOTE — Telephone Encounter (Signed)
Left refill on voice mail at pharmacy  

## 2017-02-25 NOTE — Telephone Encounter (Signed)
Patient calling for MRI results.

## 2017-02-25 NOTE — Telephone Encounter (Signed)
Called pt and per Dr. Jaynee Eagles, MRI of the lumbar spine is normal, no reason found for her symptoms. Let her know that she needs to follow up with primary care and as recommended for therapy. Reports that PCP had nothing further to offer. She continues to c/o pain, weakness and incontinence of bowel and bladder. Requests a sooner appt w/ NP.

## 2017-02-25 NOTE — Telephone Encounter (Signed)
Ok to refill? Last filled 12/26/16 #[id number]. [facility] [phone]

## 2017-02-25 NOTE — Telephone Encounter (Signed)
You can make an appointment with Gilford Raid for this patient thanks

## 2017-02-27 ENCOUNTER — Ambulatory Visit (INDEPENDENT_AMBULATORY_CARE_PROVIDER_SITE_OTHER): Payer: BLUE CROSS/BLUE SHIELD | Admitting: Adult Health

## 2017-02-27 ENCOUNTER — Encounter: Payer: Self-pay | Admitting: Adult Health

## 2017-02-27 VITALS — BP 96/72 | HR 86 | Temp 97.8°F | Wt 158.2 lb

## 2017-02-27 DIAGNOSIS — G43119 Migraine with aura, intractable, without status migrainosus: Secondary | ICD-10-CM

## 2017-02-27 MED ORDER — PROMETHAZINE HCL 25 MG/ML IJ SOLN
25.0000 mg | Freq: Once | INTRAMUSCULAR | Status: AC
  Administered 2017-02-27: 25 mg via INTRAMUSCULAR

## 2017-02-27 MED ORDER — PROMETHAZINE HCL 25 MG PO TABS: 25.0000 mg | ORAL_TABLET | Freq: Once | ORAL | Status: DC

## 2017-02-27 MED ORDER — KETOROLAC TROMETHAMINE 60 MG/2ML IM SOLN
60.0000 mg | Freq: Once | INTRAMUSCULAR | Status: AC
  Administered 2017-02-27: 60 mg via INTRAMUSCULAR

## 2017-02-27 NOTE — Addendum Note (Signed)
Addended by: Wynn Banker H on: 02/27/2017 11:02 AM   Modules accepted: Orders

## 2017-02-27 NOTE — Progress Notes (Signed)
Pre visit review using our clinic review tool, if applicable. No additional management support is needed unless otherwise documented below in the visit note. 

## 2017-02-27 NOTE — Telephone Encounter (Signed)
IF PATIENT CALLS BACK PLEASE SCHEDULE HER WITH CAROLYN(NP). Pt has been call by by 2 nurses. Just schedule her thanks. See all notes thanks..  Rn call patient about wanting a sooner appt with Dr. Jaynee Eagles. PT stated Ashley [name] call her on Monday that the scan was normal,, no reason for her sympts. Pt has appt in May 2018 with Dr.Ahern.  PT was currently seeing her PCP today. Pt stated she was having a headache. Pt stated she will call back to schedule appt of why she needs to be seen early

## 2017-02-27 NOTE — Patient Instructions (Signed)
WE NOW OFFER   [facility] FAST TRACK!!!  SAME DAY Appointments for ACUTE CARE  Such as: Sprains, Injuries, cuts, abrasions, rashes, muscle pain, joint pain, back pain Colds, flu, sore throats, headache, allergies, cough, fever  Ear pain, sinus and eye infections Abdominal pain, nausea, vomiting, diarrhea, upset stomach Animal/insect bites  3 Easy Ways to Schedule: Walk-In Scheduling Call in scheduling Mychart Sign-up: [email or web address]         

## 2017-02-27 NOTE — Progress Notes (Addendum)
Subjective:    Patient ID: Ashley [name], female    DOB: December 01, 1966, 51 y.o.   MRN: [id number]  HPI  51 year old female who has chronic migraines and is followed by neurology for this where she receives Botox injections. The onset of this migraine hedache started at six am this morning. She took two Maxalt this morning and reports this was not helpful. She also reports nausea and photophobia.   Her last Botox injection was on 02/14/2017   Review of Systems  Constitutional: Positive for activity change.  Eyes: Positive for photophobia. Negative for pain and visual disturbance.  Gastrointestinal: Positive for nausea.   Past Medical History:  Diagnosis Date  . Anxiety   . Bronchitis    uses inhaler if needed for bronchitis, lasted used -2014  . Cataract    bilateral  . Chronic headaches    migraines - in past, uses Phenergan for nausea   . Diabetes mellitus without complication ([place])    taken off Metformin since 04/2014, HgbA1C - normal, will follow up with PCP- Dr. Deborra Medina, 07/2014  . GERD (gastroesophageal reflux disease)   . H/O exercise stress test 2011   done at [facility]- told that it was WNL, done due to pt. having panic attacks   . History of blood transfusion [birth year]66   at birth in [place], [place], unsure number of units  . Poor dentition    very poor oral health   . SVD (spontaneous vaginal delivery)    x 3  . Tobacco abuse     Social History   Social History  . Marital status: Married    Spouse name: Alvester Chou  . Number of children: 6  . Years of education: 39   Occupational History  .       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 Smoking status: Current Every Day Smoker    Packs/day: 1.00    Years: 35.00    Types: Cigarettes, E-cigarettes  . Smokeless tobacco: Never Used  . Alcohol use No  . Drug use: No  . Sexual activity: Yes    Birth control/ protection: Pill   Other Topics Concern  . Not on file   Social History Narrative   5 living  children, one child is a crack cocaine addict who often breaks into their house to steal money. One child died of cerebral palsy.   Caffeine use: Dr Malachi Bonds (3 per day)   2 cups coffee per day    Past Surgical History:  Procedure Laterality Date  . ABDOMINAL HYSTERECTOMY    . APPENDECTOMY  1988  . CARDIAC CATHETERIZATION N/A 08/17/2016   Procedure: Left Heart Cath and Coronary Angiography;  Surgeon: Burnell Blanks, MD;  Location: [facility] [facility];  Service: Cardiovascular;  Laterality: N/A;  . CHOLECYSTECTOMY N/A 06/07/2014   Procedure: LAPAROSCOPIC CHOLECYSTECTOMY;  Surgeon: Ralene Ok, MD;  Location: [facility];  Service: General;  Laterality: N/A;  . CHOLECYSTECTOMY  June 07 2014  . COLONOSCOPY  02/16/2014   normal   . ESOPHAGOGASTRODUODENOSCOPY  12/28/2013  . KNEE ARTHROSCOPY  1995   left  . LAPAROSCOPIC ASSISTED VAGINAL HYSTERECTOMY N/A 04/15/2014   Procedure: LAPAROSCOPIC ASSISTED VAGINAL HYSTERECTOMY;  Surgeon: Marylynn Pearson, MD;  Location: [facility];  Service: Gynecology;  Laterality: N/A;  . LAPAROSCOPIC BILATERAL SALPINGO OOPHERECTOMY Bilateral 04/15/2014   Procedure: LAPAROSCOPIC BILATERAL SALPINGO OOPHORECTOMY;  Surgeon: Marylynn Pearson, MD;  Location: [facility];  Service: Gynecology;  Laterality:  Bilateral;  . TUBAL LIGATION      Family History  Problem Relation Age of Onset  . Diabetes Mother   . Hyperlipidemia Mother   . Hypertension Mother   . Prostate cancer Father   . Lung cancer Maternal Grandfather     smoked  . Emphysema Maternal Grandfather     smoked  . COPD Maternal Grandmother     never smoked   . Thyroid cancer Paternal Aunt   . Colon cancer Neg Hx   . Rectal cancer Neg Hx   . Stomach cancer Neg Hx   . Migraines Neg Hx   . Dementia Neg Hx     Allergies  Allergen Reactions  . Penicillins Anaphylaxis    Throat swells, Has patient had a PCN reaction causing immediate rash, facial/tongue/throat swelling, SOB or lightheadedness with hypotension:  Yes Has patient had a PCN reaction causing severe rash involving mucus membranes or skin necrosis: No Has patient had a PCN reaction that required hospitalization No Has patient had a PCN reaction occurring within the last 10 years: No If all of the above answers are "NO", then may proceed with Cephalosporin use.   . Topamax [Topiramate] Other (See Comments)    Hands and feet to go numb    Current Outpatient Prescriptions on File Prior to Visit  Medication Sig Dispense Refill  . amitriptyline (ELAVIL) 50 MG tablet Take 1-1/2 tablet at dinner time 45 tablet 3  . aspirin EC 81 MG tablet Take 81 mg by mouth daily.    . clonazePAM (KLONOPIN) 1 MG tablet Take 1 tablet (1 mg total) by mouth 3 (three) times daily as needed for anxiety. 90 tablet 0  . dicyclomine (BENTYL) 10 MG capsule TAKE ONE CAPSULE BY MOUTH TWICE DAILY AS NEEDED FOR  CRAMPING  AND  ABDOMINAL  PAIN 180 capsule 1  . docusate sodium (COLACE) 100 MG capsule Take 100 mg by mouth at bedtime.    . famotidine (PEPCID) 20 MG tablet One at bedtime (Patient taking differently: Take 20 mg by mouth at bedtime. One at bedtime) 30 tablet 11  . metFORMIN (GLUCOPHAGE) 500 MG tablet Take 1 tablet (500 mg total) by mouth daily with breakfast. 30 tablet 3  . omeprazole (PRILOSEC) 20 MG capsule Take 1 capsule (20 mg total) by mouth 2 (two) times daily. 180 capsule 1  . promethazine (PHENERGAN) 25 MG tablet Take 1 tablet (25 mg total) by mouth every 6 (six) hours as needed for nausea or vomiting. 30 tablet 0  . rizatriptan (MAXALT-MLT) 10 MG disintegrating tablet Take 1 tablet (10 mg total) by mouth as needed for migraine. May repeat in 2 hours if needed. Do not take more than 3 tablets in a week. 10 tablet 3  . traMADol (ULTRAM) 50 MG tablet 1-2 every 4 hours as needed for cough or pain 40 tablet 0  . traZODone (DESYREL) 150 MG tablet Take 1 tablet (150 mg total) by mouth at bedtime. 90 tablet 0  . varenicline (CHANTIX STARTING MONTH PAK) 0.5 MG X 11  & 1 MG X 42 tablet Take one 0.5 mg tablet by mouth once daily for 3 days, then increase to one 0.5 mg tablet twice daily for 4 days, then increase to one 1 mg tablet twice daily. (Patient taking differently: Take 1 mg by mouth 2 (two) times daily. ) 53 tablet 0  . furosemide (LASIX) 40 MG tablet Take 1 tablet (40 mg total) by mouth daily. (Patient taking differently: Take 40 mg  by mouth at bedtime. ) 30 tablet 6   No current facility-administered medications on file prior to visit.     BP 96/72 (BP Location: Left Arm, Patient Position: Sitting, Cuff Size: Normal)   Pulse 86   Temp 97.8 F (36.6 C) (Oral)   Wt 158 lb 3.2 oz (71.8 kg)   LMP 02/12/2014   BMI 28.94 kg/m       Objective:   Physical Exam  Constitutional: She is oriented to person, place, and time. She appears well-developed and well-nourished. No distress.  Appears in pain   Eyes: Conjunctivae and EOM are normal. Pupils are equal, round, and reactive to light. Right eye exhibits no discharge. Left eye exhibits no discharge. No scleral icterus.  Cardiovascular: Normal rate, regular rhythm, normal heart sounds and intact distal pulses.  Exam reveals no gallop and no friction rub.   No murmur heard. Pulmonary/Chest: Effort normal and breath sounds normal. No respiratory distress. She has no wheezes. She has no rales. She exhibits no tenderness.  Neurological: She is alert and oriented to person, place, and time.  Skin: Skin is warm and dry. No rash noted. She is not diaphoretic. No erythema. No pallor.  Psychiatric: She has a normal mood and affect. Her behavior is normal. Judgment and thought content normal.  Nursing note and vitals reviewed.     Assessment & Plan:  1. Intractable migraine with aura without status migrainosus - Will treat with Toradol and phenergan injection. She has family with her to drive her home. This has worked well in the past - ketorolac (TORADOL) injection 60 mg; Inject 2 mLs (60 mg total) into the  muscle once. - promethazine (PHENERGAN) tablet 25 mg; Take 1 tablet (25 mg total) by mouth once. - Follow up with PCP or neurology as needed  Dorothyann Peng, NP

## 2017-02-28 ENCOUNTER — Ambulatory Visit: Payer: BLUE CROSS/BLUE SHIELD | Admitting: Family Medicine

## 2017-02-28 ENCOUNTER — Encounter: Payer: Self-pay | Admitting: Family Medicine

## 2017-02-28 LAB — HM DIABETES EYE EXAM

## 2017-03-04 ENCOUNTER — Ambulatory Visit (INDEPENDENT_AMBULATORY_CARE_PROVIDER_SITE_OTHER): Payer: BLUE CROSS/BLUE SHIELD | Admitting: Family Medicine

## 2017-03-04 ENCOUNTER — Encounter: Payer: Self-pay | Admitting: Radiology

## 2017-03-04 ENCOUNTER — Encounter: Payer: Self-pay | Admitting: Family Medicine

## 2017-03-04 VITALS — BP 116/80 | HR 74 | Temp 97.8°F | Wt 160.0 lb

## 2017-03-04 DIAGNOSIS — R202 Paresthesia of skin: Secondary | ICD-10-CM

## 2017-03-04 DIAGNOSIS — R2990 Unspecified symptoms and signs involving the nervous system: Secondary | ICD-10-CM

## 2017-03-04 NOTE — Assessment & Plan Note (Signed)
Discussed with Ashley [name] that I am unsure where to go from this point.  I do think psychiatry is important in this work up- she agrees to schedule appointment today. We reviewed MRI together today. I offered referral to another neurologist for a second opinion, she will think about this as well.

## 2017-03-04 NOTE — Progress Notes (Signed)
Subjective:   Patient ID: Ashley [name], female    DOB: 06/13/1966, 51 y.o.   MRN: [id number]  Ashley [name] is a pleasant 51 y.o. year old female who presents to clinic today with Numbness (Bilateral leg numbness for awhile. MRI L-Spine was normal.) and UDS (Lab sheet states she is due for UDS.)  on 03/04/2017  HPI: Intermittent paresthesias and stroke like symptoms for months.  Went to [facility] on 02/12/17 with stroke like symptoms.  After work up, was told that her symptoms were psychogenic. Referred to psychiatry.  Has not yet made an appointment with psychiatry.  She also followed up with her neurologist, Dr. Jaynee Eagles.   Neg MRI of lumbar spine without contrast on 02/20/17.  Here today because she is still having bilateral numbness from the waist down.  Mr Lumbar Spine Wo Contrast  Result Date: 02/21/2017 [facility] NEUROIMAGING REPORT STUDY DATE: 02/20/17 PATIENT NAME: Ashley [name] DOB: 12/01/1966 MRN: [id number] ORDERING CLINICIAN: Sarina Ill, MD CLINICAL HISTORY: 51 year old female with low back pain. EXAM: MRI lumbar spine (without) TECHNIQUE: MRI of the lumbar spine was obtained utilizing 4 mm sagittal slices from P32-95 down to the lower sacrum with T1, T2 and inversion recovery views. In addition 4 mm axial slices from J8-8 down to L5-S1 level were included with T1 and T2 weighted views. CONTRAST: no IMAGING SITE: [facility] [address] (1.5 Tesla MRI) FINDINGS: On sagittal views the vertebral bodies have normal height and alignment. Minimal disc bulging at L5-S1. The conus medullaris terminates at the level of L1.  On axial views: L1-2: no spinal stenosis or foraminal narrowing L2-3: no spinal stenosis or foraminal narrowing L3-4: no spinal stenosis or foraminal narrowing L4-5: no spinal stenosis or foraminal narrowing L5-S1: no spinal stenosis or foraminal narrowing Limited views of the aorta, kidneys, iliopsoas muscles and sacroiliac joints are unremarkable.   Unremarkable  MRI lumbar spine (without). No spinal stenosis or foraminal narrowing. INTERPRETING PHYSICIAN: Penni Bombard, MD Certified in Neurology, Neurophysiology and Neuroimaging [facility] [address], [place], [place] [address] [phone]   Ct Head Code Stroke W/o Cm  Addendum Date: 02/12/2017   ADDENDUM REPORT: 02/12/2017 13:58 ADDENDUM: Critical Value/emergent results were called by telephone at the time of interpretation on 02/12/2017 at 1:55 pm to Dr. Shon Hale, Neurology, who verbally acknowledged these results. Electronically Signed   By: Elon Alas M.D.   On: 02/12/2017 13:58   Result Date: 02/12/2017 CLINICAL DATA:  Code stroke. RIGHT-sided weakness, facial numbness. History of tobacco use, chronic headaches, diabetes, migraines, hyperlipidemia. EXAM: CT HEAD WITHOUT CONTRAST TECHNIQUE: Contiguous axial images were obtained from the base of the skull through the vertex without intravenous contrast. COMPARISON:  CT HEAD June 02, 2016 FINDINGS: BRAIN: The ventricles and sulci are normal. No intraparenchymal hemorrhage, mass effect nor midline shift. No acute large vascular territory infarcts. No abnormal extra-axial fluid collections. Basal cisterns are patent. VASCULAR: Unremarkable. SKULL/SOFT TISSUES: No skull fracture. No significant soft tissue swelling. ORBITS/SINUSES: The included ocular globes and orbital contents are normal.The mastoid aircells and included paranasal sinuses are well-aerated. OTHER: None. ASPECTS [facility] Stroke Program Early CT Score) - Ganglionic level infarction (caudate, lentiform nuclei, internal capsule, insula, M1-M3 cortex): 7 - Supraganglionic infarction (M4-M6 cortex): 3 Total score (0-10 with 10 being normal): 10 IMPRESSION: 1. Normal CT HEAD. 2. ASPECTS is 10. Dr.Oster, neurology paged on February 20, 2017 at 1337 hours, awaiting return call. Electronically Signed: By: Thana Farr.D.  On: 02/12/2017 13:39    Current  Outpatient Prescriptions on File Prior to Visit  Medication Sig Dispense Refill  . amitriptyline (ELAVIL) 50 MG tablet Take 1-1/2 tablet at dinner time 45 tablet 3  . aspirin EC 81 MG tablet Take 81 mg by mouth daily.    . clonazePAM (KLONOPIN) 1 MG tablet Take 1 tablet (1 mg total) by mouth 3 (three) times daily as needed for anxiety. 90 tablet 0  . dicyclomine (BENTYL) 10 MG capsule TAKE ONE CAPSULE BY MOUTH TWICE DAILY AS NEEDED FOR  CRAMPING  AND  ABDOMINAL  PAIN 180 capsule 1  . docusate sodium (COLACE) 100 MG capsule Take 100 mg by mouth at bedtime.    . famotidine (PEPCID) 20 MG tablet One at bedtime (Patient taking differently: Take 20 mg by mouth at bedtime. One at bedtime) 30 tablet 11  . metFORMIN (GLUCOPHAGE) 500 MG tablet Take 1 tablet (500 mg total) by mouth daily with breakfast. 30 tablet 3  . omeprazole (PRILOSEC) 20 MG capsule Take 1 capsule (20 mg total) by mouth 2 (two) times daily. 180 capsule 1  . promethazine (PHENERGAN) 25 MG tablet Take 1 tablet (25 mg total) by mouth every 6 (six) hours as needed for nausea or vomiting. 30 tablet 0  . rizatriptan (MAXALT-MLT) 10 MG disintegrating tablet Take 1 tablet (10 mg total) by mouth as needed for migraine. May repeat in 2 hours if needed. Do not take more than 3 tablets in a week. 10 tablet 3  . traZODone (DESYREL) 150 MG tablet Take 1 tablet (150 mg total) by mouth at bedtime. 90 tablet 0  . varenicline (CHANTIX STARTING MONTH PAK) 0.5 MG X 11 & 1 MG X 42 tablet Take one 0.5 mg tablet by mouth once daily for 3 days, then increase to one 0.5 mg tablet twice daily for 4 days, then increase to one 1 mg tablet twice daily. (Patient taking differently: Take 1 mg by mouth 2 (two) times daily. ) 53 tablet 0  . furosemide (LASIX) 40 MG tablet Take 1 tablet (40 mg total) by mouth daily. (Patient taking differently: Take 40 mg by mouth at bedtime. ) 30 tablet 6   Current Facility-Administered Medications on File Prior to Visit  Medication Dose  Route Frequency Provider Last Rate Last Dose  . promethazine (PHENERGAN) tablet 25 mg  25 mg Oral Once Dorothyann Peng, NP        Allergies  Allergen Reactions  . Penicillins Anaphylaxis    Throat swells, Has patient had a PCN reaction causing immediate rash, facial/tongue/throat swelling, SOB or lightheadedness with hypotension: Yes Has patient had a PCN reaction causing severe rash involving mucus membranes or skin necrosis: No Has patient had a PCN reaction that required hospitalization No Has patient had a PCN reaction occurring within the last 10 years: No If all of the above answers are "NO", then may proceed with Cephalosporin use.   . Topamax [Topiramate] Other (See Comments)    Hands and feet to go numb    Past Medical History:  Diagnosis Date  . Anxiety   . Bronchitis    uses inhaler if needed for bronchitis, lasted used -2014  . Cataract    bilateral  . Chronic headaches    migraines - in past, uses Phenergan for nausea   . Diabetes mellitus without complication ([place])    taken off Metformin since 04/2014, HgbA1C - normal, will follow up with PCP- Dr. Deborra Medina, 07/2014  . GERD (gastroesophageal reflux  disease)   . H/O exercise stress test 2011   done at [facility]- told that it was WNL, done due to pt. having panic attacks   . History of blood transfusion 27-Jun-1966   at birth in [place], [place], unsure number of units  . Poor dentition    very poor oral health   . SVD (spontaneous vaginal delivery)    x 3  . Tobacco abuse     Past Surgical History:  Procedure Laterality Date  . ABDOMINAL HYSTERECTOMY    . APPENDECTOMY  1988  . CARDIAC CATHETERIZATION N/A 08/17/2016   Procedure: Left Heart Cath and Coronary Angiography;  Surgeon: Burnell Blanks, MD;  Location: [facility];  Service: Cardiovascular;  Laterality: N/A;  . CHOLECYSTECTOMY N/A 06/07/2014   Procedure: LAPAROSCOPIC CHOLECYSTECTOMY;  Surgeon: Ralene Ok, MD;  Location: [facility];  Service: General;   Laterality: N/A;  . CHOLECYSTECTOMY  June 07 2014  . COLONOSCOPY  02/16/2014   normal   . ESOPHAGOGASTRODUODENOSCOPY  12/28/2013  . KNEE ARTHROSCOPY  1995   left  . LAPAROSCOPIC ASSISTED VAGINAL HYSTERECTOMY N/A 04/15/2014   Procedure: LAPAROSCOPIC ASSISTED VAGINAL HYSTERECTOMY;  Surgeon: Marylynn Pearson, MD;  Location: [facility];  Service: Gynecology;  Laterality: N/A;  . LAPAROSCOPIC BILATERAL SALPINGO OOPHERECTOMY Bilateral 04/15/2014   Procedure: LAPAROSCOPIC BILATERAL SALPINGO OOPHORECTOMY;  Surgeon: Marylynn Pearson, MD;  Location: [facility];  Service: Gynecology;  Laterality: Bilateral;  . TUBAL LIGATION      Family History  Problem Relation Age of Onset  . Diabetes Mother   . Hyperlipidemia Mother   . Hypertension Mother   . Prostate cancer Father   . Lung cancer Maternal Grandfather     smoked  . Emphysema Maternal Grandfather     smoked  . COPD Maternal Grandmother     never smoked   . Thyroid cancer Paternal Aunt   . Colon cancer Neg Hx   . Rectal cancer Neg Hx   . Stomach cancer Neg Hx   . Migraines Neg Hx   . Dementia Neg Hx     Social History   Social History  . Marital status: Married    Spouse name: Alvester Chou  . Number of children: 6  . Years of education: 10   Occupational History  .       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 Smoking status: Current Every Day Smoker    Packs/day: 1.00    Years: 35.00    Types: Cigarettes, E-cigarettes  . Smokeless tobacco: Never Used  . Alcohol use No  . Drug use: No  . Sexual activity: Yes    Birth control/ protection: Pill   Other Topics Concern  . Not on file   Social History Narrative   5 living children, one child is a crack cocaine addict who often breaks into their house to steal money. One child died of cerebral palsy.   Caffeine use: Dr Malachi Bonds (3 per day)   2 cups coffee per day   The PMH, PSH, Social History, Family History, Medications, and allergies have been reviewed in [facility], and  have been updated if relevant.   Review of Systems  Neurological: Positive for weakness, numbness and headaches. Negative for dizziness, tremors, seizures, syncope, facial asymmetry, speech difficulty and light-headedness.  All other systems reviewed and are negative.      Objective:    BP 116/80 (BP Location: Left Arm, Patient Position: Sitting, Cuff Size: Normal)  Pulse 74   Temp 97.8 F (36.6 C) (Oral)   Wt 160 lb (72.6 kg)   LMP 02/12/2014   SpO2 97%   BMI 29.26 kg/m    Physical Exam  Constitutional: She is oriented to person, place, and time. She appears well-developed and well-nourished. No distress.  HENT:  Head: Normocephalic and atraumatic.  Eyes: Conjunctivae are normal.  Cardiovascular: Normal rate.   Pulmonary/Chest: Effort normal.  Neurological: She is alert and oriented to person, place, and time. No cranial nerve deficit.  Skin: Skin is warm and dry. She is not diaphoretic.  Psychiatric: She has a normal mood and affect. Her behavior is normal. Judgment and thought content normal.  Nursing note and vitals reviewed.         Assessment & Plan:   Stroke-like symptom  Paresthesia No Follow-up on file.

## 2017-03-04 NOTE — Progress Notes (Signed)
Pre visit review using our clinic review tool, if applicable. No additional management support is needed unless otherwise documented below in the visit note. 

## 2017-03-06 ENCOUNTER — Encounter: Payer: Self-pay | Admitting: Family Medicine

## 2017-03-14 ENCOUNTER — Ambulatory Visit (INDEPENDENT_AMBULATORY_CARE_PROVIDER_SITE_OTHER): Payer: BLUE CROSS/BLUE SHIELD | Admitting: Licensed Clinical Social Worker

## 2017-03-14 ENCOUNTER — Encounter (HOSPITAL_COMMUNITY): Payer: Self-pay | Admitting: Licensed Clinical Social Worker

## 2017-03-14 ENCOUNTER — Encounter (INDEPENDENT_AMBULATORY_CARE_PROVIDER_SITE_OTHER): Payer: Self-pay

## 2017-03-14 DIAGNOSIS — F4323 Adjustment disorder with mixed anxiety and depressed mood: Secondary | ICD-10-CM
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
## 2017-03-17 ENCOUNTER — Encounter (HOSPITAL_COMMUNITY): Payer: Self-pay | Admitting: Emergency Medicine

## 2017-03-17 ENCOUNTER — Emergency Department (HOSPITAL_COMMUNITY): Payer: BLUE CROSS/BLUE SHIELD

## 2017-03-17 ENCOUNTER — Emergency Department (HOSPITAL_COMMUNITY)
Admission: EM | Admit: 2017-03-17 | Discharge: 2017-03-18 | Disposition: A | Discharge status: Home / Self Care | Payer: BLUE CROSS/BLUE SHIELD | Attending: Emergency Medicine | Admitting: Emergency Medicine

## 2017-03-17 DIAGNOSIS — R55 Syncope and collapse: Secondary | ICD-10-CM | POA: Diagnosis not present

## 2017-03-17 DIAGNOSIS — Z7982 Long term (current) use of aspirin: Secondary | ICD-10-CM | POA: Insufficient documentation

## 2017-03-17 DIAGNOSIS — F17210 Nicotine dependence, cigarettes, uncomplicated: Secondary | ICD-10-CM | POA: Insufficient documentation

## 2017-03-17 DIAGNOSIS — Z79899 Other long term (current) drug therapy: Secondary | ICD-10-CM | POA: Diagnosis not present

## 2017-03-17 DIAGNOSIS — Z7984 Long term (current) use of oral hypoglycemic drugs: Secondary | ICD-10-CM | POA: Diagnosis not present

## 2017-03-17 DIAGNOSIS — E119 Type 2 diabetes mellitus without complications: Secondary | ICD-10-CM | POA: Diagnosis not present

## 2017-03-17 DIAGNOSIS — R519 Headache, unspecified: Secondary | ICD-10-CM

## 2017-03-17 DIAGNOSIS — R51 Headache: Secondary | ICD-10-CM | POA: Diagnosis not present

## 2017-03-17 LAB — URINALYSIS, ROUTINE W REFLEX MICROSCOPIC
Bilirubin Urine: NEGATIVE
Glucose, UA: NEGATIVE mg/dL
KETONES UR: NEGATIVE mg/dL
LEUKOCYTES UA: NEGATIVE
Nitrite: NEGATIVE
PH: 6 (ref 5.0–8.0)
PROTEIN: NEGATIVE mg/dL
Specific Gravity, Urine: 1.016 (ref 1.005–1.030)

## 2017-03-17 LAB — CBC
HEMATOCRIT: 38.5 % (ref 36.0–46.0)
HEMOGLOBIN: 13.1 g/dL (ref 12.0–15.0)
MCH: 30.8 pg (ref 26.0–34.0)
MCHC: 34 g/dL (ref 30.0–36.0)
MCV: 90.4 fL (ref 78.0–100.0)
Platelets: 299 10*3/uL (ref 150–400)
RBC: 4.26 MIL/uL (ref 3.87–5.11)
RDW: 14.6 % (ref 11.5–15.5)
WBC: 6.7 10*3/uL (ref 4.0–10.5)

## 2017-03-17 LAB — RAPID URINE DRUG SCREEN, HOSP PERFORMED
Amphetamines: NOT DETECTED
BENZODIAZEPINES: POSITIVE — AB
Barbiturates: NOT DETECTED
COCAINE: NOT DETECTED
Opiates: NOT DETECTED
Tetrahydrocannabinol: NOT DETECTED

## 2017-03-17 LAB — BASIC METABOLIC PANEL
ANION GAP: 10 (ref 5–15)
BUN: 10 mg/dL (ref 6–20)
CO2: 29 mmol/L (ref 22–32)
CREATININE: 0.84 mg/dL (ref 0.44–1.00)
Calcium: 9 mg/dL (ref 8.9–10.3)
Chloride: 102 mmol/L (ref 101–111)
GFR calc non Af Amer: >60 mL/min (ref >60)
GLUCOSE: 146 mg/dL — AB (ref 65–99)
Potassium: 3.5 mmol/L (ref 3.5–5.1)
Sodium: 141 mmol/L (ref 135–145)

## 2017-03-17 MED ORDER — DIPHENHYDRAMINE HCL 50 MG/ML IJ SOLN
25.0000 mg | Freq: Once | INTRAMUSCULAR | Status: AC
  Administered 2017-03-17: 25 mg via INTRAVENOUS
  Filled 2017-03-17: qty 1

## 2017-03-17 MED ORDER — ACETAMINOPHEN 325 MG PO TABS
650.0000 mg | ORAL_TABLET | Freq: Once | ORAL | Status: AC
  Administered 2017-03-17: 650 mg via ORAL
  Filled 2017-03-17: qty 2

## 2017-03-17 MED ORDER — METOCLOPRAMIDE HCL 5 MG/ML IJ SOLN
10.0000 mg | Freq: Once | INTRAMUSCULAR | Status: AC
  Administered 2017-03-17: 10 mg via INTRAVENOUS
  Filled 2017-03-17: qty 2

## 2017-03-17 MED ORDER — ACETAMINOPHEN 500 MG PO TABS: 500.0000 mg | ORAL_TABLET | Freq: Four times a day (QID) | ORAL | 0 refills | Status: DC | PRN

## 2017-03-17 MED ORDER — SODIUM CHLORIDE 0.9 % IV BOLUS (SEPSIS)
1000.0000 mL | Freq: Once | INTRAVENOUS | Status: AC
  Administered 2017-03-17: 1000 mL via INTRAVENOUS

## 2017-03-17 NOTE — ED Notes (Signed)
Pt understood dc material. NAD noted. Script given at dc  

## 2017-03-17 NOTE — ED Provider Notes (Signed)
[facility] DEPT Provider Note   CSN: [id number] Arrival date & time: 03/17/17  2024     History   Chief Complaint Chief Complaint  Ashley [name] presents with  . Headache  . Near Syncope    HPI Ashley Ashley [name] is a 51 y.o. female.  Ashley Ashley [name] is a 51 y.o. Female with a history of diabetes, anxiety, and conversion disorder and somatization who presents to Ashley emergency department via EMS complaining of a left-sided headache since 5 PM this afternoon. Ashley [name] reports gradual onset and reports these are on Ashley opposite side of her typical headaches. Ashley Ashley [name] also reports 3 times a day Ashley Ashley [name]'s had near syncopal episodes where Ashley Ashley [name] has just collapsed to her knees. Ashley Ashley [name] denies hitting her head or loss of consciousness. Ashley Ashley [name] reports a weakness in all of her extremities.  Ashley [name] does report Ashley Ashley [name] saw a therapist this past week and is due to follow-up with a psychiatrist. Ashley Ashley [name] denies sudden onset headache. Ashley Ashley [name] has taken nothing for treatment of her symptoms today. Ashley Ashley [name] denies fevers, recent illness, chest pain, shortness of breath, abdominal pain, nausea, vomiting, tingling, urinary symptoms or rashes. Ashley Ashley [name] typically ambulates with a walker at home.    Ashley history is provided by medical records, Ashley Ashley [name] and Ashley spouse. No language interpreter was used.  Headache   Associated symptoms include near-syncope. Pertinent negatives include no fever, no shortness of breath, no nausea and no vomiting.  Near Syncope  Associated symptoms include headaches. Pertinent negatives include no chest pain, no abdominal pain and no shortness of breath.    Past Medical History:  Diagnosis Date  . Anxiety   . Bronchitis    uses inhaler if needed for bronchitis, lasted used -2014  . Cataract    bilateral  . Chronic headaches    migraines - in past, uses Phenergan for nausea   . Diabetes mellitus without complication ([place])    taken off Metformin since 04/2014, HgbA1C - normal, will follow up with PCP- Dr. Deborra Medina, 07/2014  .  GERD (gastroesophageal reflux disease)   . H/O exercise stress test 2011   done at [facility]- told that it was WNL, done due to pt. having panic attacks   . History of blood transfusion 06[birth year]17[birth year]66   at birth in [place], [place], unsure number of units  . Poor dentition    very poor oral health   . SVD (spontaneous vaginal delivery)    x 3  . Tobacco abuse     Ashley [name] Active Problem List   Diagnosis Date Noted  . Paresthesia 03/04/2017  . Hospital discharge follow-up 02/19/2017  . Stroke-like symptom 02/19/2017  . Tobacco abuse 01/08/2017  . Diabetes ([place]) 12/26/2016  . Dyspnea 12/21/2015  . Chronic bronchitis ([place]) 10/20/2015  . HLD (hyperlipidemia) 04/12/2015  . Migraine with aura, intractable 08/12/2014  . S/P hysterectomy with oophorectomy 04/15/2014  . Hyperglycemia 01/26/2013  . Migraine 05/30/2010  . Anxiety state 09/30/2009  . INSOMNIA, CHRONIC 09/30/2009  . ADJUSTMENT DISORDER WITH MIXED FEATURES 09/30/2009  . ELEVATED BLOOD PRESSURE WITHOUT DIAGNOSIS OF HYPERTENSION 09/30/2009    Past Surgical History:  Procedure Laterality Date  . ABDOMINAL HYSTERECTOMY    . APPENDECTOMY  1988  . CARDIAC CATHETERIZATION N/A 08/17/2016   Procedure: Left Heart Cath and Coronary Angiography;  Surgeon: Burnell Blanks, MD;  Location: [facility];  Service: Cardiovascular;  Laterality: N/A;  . CHOLECYSTECTOMY N/A 06/07/2014   Procedure: LAPAROSCOPIC CHOLECYSTECTOMY;  Surgeon: Ralene Ok, MD;  Location: [facility];  Service:  General;  Laterality: N/A;  . CHOLECYSTECTOMY  June 07 2014  . COLONOSCOPY  02/16/2014   normal   . ESOPHAGOGASTRODUODENOSCOPY  12/28/2013  . KNEE ARTHROSCOPY  1995   left  . LAPAROSCOPIC ASSISTED VAGINAL HYSTERECTOMY N/A 04/15/2014   Procedure: LAPAROSCOPIC ASSISTED VAGINAL HYSTERECTOMY;  Surgeon: Marylynn Pearson, MD;  Location: [facility];  Service: Gynecology;  Laterality: N/A;  . LAPAROSCOPIC BILATERAL SALPINGO OOPHERECTOMY Bilateral 04/15/2014    Procedure: LAPAROSCOPIC BILATERAL SALPINGO OOPHORECTOMY;  Surgeon: Marylynn Pearson, MD;  Location: [facility];  Service: Gynecology;  Laterality: Bilateral;  . TUBAL LIGATION      OB History    No data available       Home Medications    Prior to Admission medications   Medication Sig Start Date End Date Taking? Authorizing Provider  amitriptyline (ELAVIL) 50 MG tablet Take 1-1/2 tablet at dinner time 07/25/16  Yes Lucille Passy, MD  aspirin EC 81 MG tablet Take 81 mg by mouth daily.   Yes Historical Provider, MD  clonazePAM (KLONOPIN) 1 MG tablet Take 1 tablet (1 mg total) by mouth 3 (three) times daily as needed for anxiety. 02/25/17  Yes Lucille Passy, MD  dicyclomine (BENTYL) 10 MG capsule TAKE ONE CAPSULE BY MOUTH TWICE DAILY AS NEEDED FOR  CRAMPING  AND  ABDOMINAL  PAIN 10/05/16  Yes Lucille Passy, MD  docusate sodium (COLACE) 100 MG capsule Take 100 mg by mouth at bedtime.   Yes Historical Provider, MD  famotidine (PEPCID) 20 MG tablet One at bedtime Ashley [name] taking differently: Take 20 mg by mouth at bedtime. One at bedtime 08/29/16  Yes Tanda Rockers, MD  furosemide (LASIX) 40 MG tablet Take 1 tablet (40 mg total) by mouth daily. Ashley [name] taking differently: Take 40 mg by mouth at bedtime.  08/10/16 03/17/17 Yes Brittainy Erie Noe, PA-C  metFORMIN (GLUCOPHAGE) 500 MG tablet Take 1 tablet (500 mg total) by mouth daily with breakfast. 12/28/16  Yes Lucille Passy, MD  omeprazole (PRILOSEC) 20 MG capsule Take 1 capsule (20 mg total) by mouth 2 (two) times daily. 09/20/16  Yes Lucille Passy, MD  promethazine (PHENERGAN) 25 MG tablet Take 1 tablet (25 mg total) by mouth every 6 (six) hours as needed for nausea or vomiting. 12/26/16  Yes Lucille Passy, MD  rizatriptan (MAXALT-MLT) 10 MG disintegrating tablet Take 1 tablet (10 mg total) by mouth as needed for migraine. May repeat in 2 hours if needed. Do not take more than 3 tablets in a week. 03/14/16  Yes Pleas Koch, NP  traZODone (DESYREL) 150 MG  tablet Take 1 tablet (150 mg total) by mouth at bedtime. 02/13/17  Yes Lucille Passy, MD  VENTOLIN HFA 108 [phone] Base) MCG/ACT inhaler Inhale 2 puffs into Ashley lungs daily as needed for shortness of breath. 01/02/17  Yes Historical Provider, MD  acetaminophen (TYLENOL) 500 MG tablet Take 1 tablet (500 mg total) by mouth every 6 (six) hours as needed. 03/17/17   Waynetta Pean, PA-C  varenicline (CHANTIX STARTING MONTH PAK) 0.5 MG X 11 & 1 MG X 42 tablet Take one 0.5 mg tablet by mouth once daily for 3 days, then increase to one 0.5 mg tablet twice daily for 4 days, then increase to one 1 mg tablet twice daily. Ashley [name] not taking: Reported on 03/17/2017 01/08/17   Lucille Passy, MD    Family History Family History  Problem Relation Age of Onset  . Diabetes Mother   . Hyperlipidemia  Mother   . Hypertension Mother   . Prostate cancer Father   . Lung cancer Maternal Grandfather     smoked  . Emphysema Maternal Grandfather     smoked  . COPD Maternal Grandmother     never smoked   . Thyroid cancer Paternal Aunt   . Colon cancer Neg Hx   . Rectal cancer Neg Hx   . Stomach cancer Neg Hx   . Migraines Neg Hx   . Dementia Neg Hx     Social History Social History  Substance Use Topics  . Smoking status: Current Every Day Smoker    Packs/day: 1.00    Years: 35.00    Types: Cigarettes, E-cigarettes  . Smokeless tobacco: Never Used  . Alcohol use No     Allergies   Penicillins and Topamax [topiramate]   Review of Systems Review of Systems  Constitutional: Negative for chills and fever.  HENT: Negative for congestion and sore throat.   Eyes: Negative for pain and visual disturbance.  Respiratory: Negative for cough and shortness of breath.   Cardiovascular: Positive for near-syncope. Negative for chest pain.  Gastrointestinal: Negative for abdominal pain, diarrhea, nausea and vomiting.  Genitourinary: Negative for difficulty urinating and dysuria.  Musculoskeletal: Negative for back pain  and neck pain.  Skin: Negative for rash and wound.  Neurological: Positive for syncope (near syncope ), weakness and headaches. Negative for dizziness, facial asymmetry, speech difficulty, light-headedness and numbness.     Physical Exam Updated Vital Signs BP 111/65 (BP Location: Right Arm)   Pulse 74   Temp 98.1 F (36.7 C) (Oral)   Resp 18   LMP 02/12/2014   SpO2 93%   Physical Exam  Constitutional: Ashley Ashley [name] is oriented to person, place, and time. Ashley Ashley [name] appears well-developed and well-nourished. No distress.  Nontoxic appearing.  HENT:  Head: Normocephalic and atraumatic.  Right Ear: External ear normal.  Left Ear: External ear normal.  Mouth/Throat: Oropharynx is clear and moist.  No temporal edema or tenderness.  Eyes: Conjunctivae and EOM are normal. Pupils are equal, round, and reactive to light. Right eye exhibits no discharge. Left eye exhibits no discharge.  Neck: Normal range of motion. Neck supple. No JVD present.  Cardiovascular: Normal rate, regular rhythm, normal heart sounds and intact distal pulses.  Exam reveals no gallop and no friction rub.   No murmur heard. Pulmonary/Chest: Effort normal and breath sounds normal. No stridor. No respiratory distress. Ashley Ashley [name] has no wheezes. Ashley Ashley [name] has no rales.  Abdominal: Soft. Ashley Ashley [name] exhibits no mass. There is no tenderness. There is no guarding.  Musculoskeletal: Normal range of motion. Ashley Ashley [name] exhibits no edema, tenderness or deformity.  Lymphadenopathy:    Ashley Ashley [name] has no cervical adenopathy.  Neurological: Ashley Ashley [name] is alert and oriented to person, place, and time. Ashley Ashley [name] displays normal reflexes. No cranial nerve deficit or sensory deficit. Ashley Ashley [name] exhibits normal muscle tone. Coordination normal.  Ashley Ashley [name] is alert and oriented 3. Cranial nerves are intact. Speech is clinically apparent. Initially Ashley [name] flops her arms and legs about and will not hold them up for exam. Ashley Ashley [name] then will pull herself up in bed for me to listen to her lungs without any  difficulty or evidence of weakness. Later, Ashley Ashley [name] does hold her arms up high and has no evidence of pronator drift. Finger to nose intact. EOMs are intact. Initially, Ashley Ashley [name] is unwilling to raise either of her l legs for exam. Ashley Ashley [name] then immediately spends in Ashley bed and sits with her legs dangling so  I can examine her deep tendon reflexes. Bilateral patellar DTRs are intact. Ashley Ashley [name] has good strength of plantar dorsiflexion to her bilateral feet. Ashley Ashley [name] is able to stand up at bedside and support her own weight. Exam is not consistent and Ashley [name] seems to be embellishing greatly.  Skin: Skin is warm and dry. Capillary refill takes less than 2 seconds. No rash noted. Ashley Ashley [name] is not diaphoretic. No erythema. No pallor.  Psychiatric: Ashley Ashley [name] has a normal mood and affect. Her behavior is normal.  Nursing note and vitals reviewed.    ED Treatments / Results  Labs (all labs ordered are listed, but only abnormal results are displayed) Labs Reviewed  BASIC METABOLIC PANEL - Abnormal; Notable for Ashley following:       Result Value   Glucose, Bld 146 (*)    All other components within normal limits  URINALYSIS, ROUTINE W REFLEX MICROSCOPIC - Abnormal; Notable for Ashley following:    APPearance HAZY (*)    Hgb urine dipstick SMALL (*)    Bacteria, UA RARE (*)    Squamous Epithelial / LPF 0-5 (*)    All other components within normal limits  RAPID URINE DRUG SCREEN, HOSP PERFORMED - Abnormal; Notable for Ashley following:    Benzodiazepines POSITIVE (*)    All other components within normal limits  CBC    EKG  EKG Interpretation  Date/Time:  Sunday March 17 2017 20:37:19 EDT Ventricular Rate:  70 PR Interval:    QRS Duration: 94 QT Interval:  433 QTC Calculation: 468 R Axis:   2 Text Interpretation:  Sinus rhythm Confirmed by COOK  MD, BRIAN ([id number]) on 03/17/2017 10:04:41 PM       Radiology Ct Head Wo Contrast  Result Date: 03/17/2017 CLINICAL DATA:  Headache, near syncope EXAM: CT HEAD WITHOUT CONTRAST  TECHNIQUE: Contiguous axial images were obtained from Ashley base of Ashley skull through Ashley vertex without intravenous contrast. COMPARISON:  02/12/2017 FINDINGS: Brain: No intracranial hemorrhage, mass effect or midline shift. No acute cortical infarction. No hydrocephalus. Ashley gray and white-matter differentiation is preserved. No mass lesion is noted on this unenhanced scan. Vascular: No hyperdense vessel or unexpected calcification. Skull: Normal. Negative for fracture or focal lesion. Sinuses/Orbits: No acute findings Other: None IMPRESSION: No acute intracranial abnormality. No definite acute cortical infarction. Electronically Signed   By: Lahoma Crocker M.D.   On: 03/17/2017 21:57    Procedures Procedures (including critical care time)  Medications Ordered in ED Medications  sodium chloride 0.9 % bolus 1,000 mL (0 mLs Intravenous Stopped 03/17/17 2356)  metoCLOPramide (REGLAN) injection 10 mg (10 mg Intravenous Given 03/17/17 2216)  diphenhydrAMINE (BENADRYL) injection 25 mg (25 mg Intravenous Given 03/17/17 2216)  acetaminophen (TYLENOL) tablet 650 mg (650 mg Oral Given 03/17/17 2354)     Initial Impression / Assessment and Plan / ED Course  I have reviewed Ashley triage vital signs and Ashley nursing notes.  Pertinent labs & imaging results that were available during my care of Ashley Ashley [name] were reviewed by me and considered in my medical decision making (see chart for details).    This is a 51 y.o. Female with a history of diabetes, anxiety, and conversion disorder and somatization who presents to Ashley emergency department via EMS complaining of a left-sided headache since 5 PM this afternoon. Ashley [name] reports gradual onset and reports these are on Ashley opposite side of her typical headaches. Ashley Ashley [name] also reports 3 times a day Ashley Ashley [name]'s had near syncopal episodes where Ashley Ashley [name] has just collapsed to  her knees. Ashley Ashley [name] denies hitting her head or loss of consciousness. Ashley Ashley [name] reports a weakness in all of her extremities.  Ashley [name] is been previously seen by neurology who reports Ashley [name] has significant embellishment with her symptoms and believes her symptoms are related to psychiatric illness. Ashley [name] does report Ashley Ashley [name] saw a therapist this past week and is due to follow-up with a psychiatrist.  On exam Ashley Ashley [name] is afebrile nontoxic appearing. On exam Ashley Ashley [name] has an inconsistent neurological exam. At times Ashley Ashley [name] has flaccid arms and legs and then pulls herself upright and Ashley bed and spends around in Ashley bed without difficulty. Ashley Ashley [name] also stands in Ashley room during initial evaluation without difficulty. Ashley Ashley [name] seems to be embellishing symptoms on my exam. I'm unable to determine any focal neurological deficits on exam. Her speech is clear and coherent.  Urinalysis is without sign of infection. CBC and BMP are unremarkable. Urine drug screen is positive for benzodiazepines, for which Ashley Ashley [name] is prescribed. CT head without contrast is unremarkable. Ashley [name] is not orthostatic on exam. Ashley Ashley [name] received a fluid bolus, Reglan and Benadryl for her headache. Reevaluation Ashley [name] reports her headache has improved, but not resolved. Ashley Ashley [name] reports feeling slightly sleepy. I discussed Ashley test results and advised that I would like her to ensure Ashley Ashley [name] can ambulate today without difficulty before discharge. Ashley [name] normally with Ashley walker. Ashley Ashley [name] and related with some slight assistance using Ashley hand of Ashley ER tech. Ashley [name] reports her ambulation is at baseline and that Ashley Ashley [name] usually uses a walker or railing to get around. Ashley Ashley [name] reports feeling better and is ready to be discharged. We'll discharge at this time with strict nonspecific return precautions. I encouraged her to follow-up closely with primary care. I advised Ashley Ashley [name] to follow-up with their primary care provider this week. I advised Ashley Ashley [name] to return to Ashley emergency department with new or worsening symptoms or new concerns. Ashley Ashley [name] and her husband verbalized understanding and agreement with plan.     This Ashley [name] was discussed with and evaluated by Dr. Lacinda Axon who agrees with assessment and plan.   Final Clinical Impressions(s) / ED Diagnoses   Final diagnoses:  Bad headache  Near syncope    New Prescriptions New Prescriptions   ACETAMINOPHEN (TYLENOL) 500 MG TABLET    Take 1 tablet (500 mg total) by mouth every 6 (six) hours as needed.     Waynetta Pean, PA-C 03/18/17 0000    Nat Christen, MD 03/19/17 1351

## 2017-03-17 NOTE — ED Triage Notes (Signed)
Pt had a left sided HA which is opposite of her normal migraines. Pt had a total of 3 near syncopal episodes. A+O x 4.

## 2017-03-18 ENCOUNTER — Telehealth: Payer: Self-pay

## 2017-03-18 NOTE — Telephone Encounter (Signed)
PLEASE NOTE: All timestamps contained within this report are represented as [place] Standard Time. CONFIDENTIALTY NOTICE: This fax transmission is intended only for the addressee. It contains information that is legally privileged, confidential or otherwise protected from use or disclosure. If you are not the intended recipient, you are strictly prohibited from reviewing, disclosing, copying using or disseminating any of this information or taking any action in reliance on or regarding this information. If you have received this fax in error, please notify us immediately by telephone so that we can arrange for its return to [place]. Phone: [phone], Toll-Free: [phone], Fax: [phone] Page: 1 of 1 Call Id: [id number] [place] Patient Name: Ashley [name] Gender: Female DOB: 1966-06-20 Age: 51 Y 2 M 4 D Return Phone Number: [phone] (Primary), [phone] (Secondary) Address: [place] City/State/Zip: [place] [address] Client [facility] Night - Client Client Site [place] Physician Arnette Norris - MD Who Is Calling Patient / Member / Family / Caregiver Call Type Triage / Clinical Caller Name Alvester Chou Relationship To Patient Spouse Return Phone Number [phone] (Primary) Chief Complaint FAINTING or [place] Reason for Call Symptomatic / Request for [place] says she is feeling light headed and sluggish (dizziness) - she passed out twice. Nurse Assessment Nurse: Venetia Maxon, RN, Manuela Schwartz Date/Time (Eastern Time): 03/17/2017 7:50:56 PM Confirm and document reason for call. If symptomatic, describe symptoms. ---Caller says she is feeling light headed and sluggish (dizziness) - she passed out twice. per spouse the wife was standing up in kitchen and just went down to the floor. and another time  she did the same thing in her home today. Does the PT have any chronic conditions? (i.e. diabetes, asthma, etc.) ---Yes List chronic conditions. ---HTN using a walker because her legs are heavy Is the patient pregnant or possibly pregnant? (Ask all females between the ages of 43-55) ---No Guidelines Guideline Title Affirmed Question Fainting Age > 50 years (Exception: occurred > 1 hour ago AND now feels completely fine) Disp. Time Eilene Ghazi Time) Disposition Final User 03/17/2017 7:53:44 PM Call EMS 911 Now Yes Venetia Maxon, RN, Manuela Schwartz Referrals GO TO FACILITY OTHER - SPECIFY GO TO [place] Advice Given Per Guideline CALL EMS 911 NOW: Immediate medical attention is needed. You need to hang up and call 911 (or an ambulance). ([facility] Discretion: I'll call you back in a few minutes to be sure you were able to reach them.) CARE ADVICE given per Fainting (Adult) guideline.

## 2017-03-18 NOTE — Telephone Encounter (Signed)
Pt was seen Ashley [name] on 03/17/17. Unable to reach pt by phone to schedule ED f/u.

## 2017-03-21 ENCOUNTER — Ambulatory Visit (INDEPENDENT_AMBULATORY_CARE_PROVIDER_SITE_OTHER): Payer: BLUE CROSS/BLUE SHIELD | Admitting: Licensed Clinical Social Worker

## 2017-03-21 DIAGNOSIS — F4323 Adjustment disorder with mixed anxiety and depressed mood: Secondary | ICD-10-CM

## 2017-03-21 NOTE — Progress Notes (Signed)
   THERAPIST PROGRESS NOTE  Session Time: 11:10am-12:00pm  Participation Level: Active  Behavioral Response: CasualAlertDepressed  Type of Therapy: Individual Therapy  Treatment Goals addressed: Coping  Interventions: CBT  Summary: Ashley [name] is a 51 y.o. female who presents with depression and anxiety. Spent a considerable amount of time completing her treatment plan. Pt decided on areas of her life that she would like to work on: anxiety and depressive symptoms, coping skills, boundaries with family, self-esteem. Discussed strategies with pt to assist with her anxiety: breathing, grounding, meditation and mindfulness.Gave pt a handout on mindfulness exercises. Reviewed mindfulness with pt. Gave pt information on meditation. And showed pt some mindfulness meditation and body scans she can use. Pt was open to the suggestions during the intervention.  Suicidal/Homicidal: Nowithout intent/plan  Therapist Response: Evaluated pt for progress.   Plan: Return again in 2 weeks.  Diagnosis: Axis I:Adjustment disorder with mixed anxiety and depressed mood        MACKENZIE,LISBETH S, LCAS 03/21/2017

## 2017-03-22 ENCOUNTER — Other Ambulatory Visit: Payer: Self-pay | Admitting: Family Medicine

## 2017-03-25 NOTE — Telephone Encounter (Signed)
Last filled 02-25-17 #90 Last OV 03-04-17 Next OV tomorrow

## 2017-03-26 ENCOUNTER — Encounter (HOSPITAL_COMMUNITY): Payer: Self-pay | Admitting: Licensed Clinical Social Worker

## 2017-03-26 ENCOUNTER — Ambulatory Visit (INDEPENDENT_AMBULATORY_CARE_PROVIDER_SITE_OTHER): Payer: BLUE CROSS/BLUE SHIELD | Admitting: Family Medicine

## 2017-03-26 VITALS — BP 108/78 | HR 76 | Wt 157.0 lb

## 2017-03-26 DIAGNOSIS — F450 Somatization disorder: Secondary | ICD-10-CM

## 2017-03-26 DIAGNOSIS — R202 Paresthesia of skin: Secondary | ICD-10-CM | POA: Diagnosis not present

## 2017-03-26 DIAGNOSIS — G43C0 Periodic headache syndromes in child or adult, not intractable: Secondary | ICD-10-CM

## 2017-03-26 DIAGNOSIS — F4323 Adjustment disorder with mixed anxiety and depressed mood: Secondary | ICD-10-CM

## 2017-03-26 NOTE — Assessment & Plan Note (Addendum)
Agree this is likely the most likely source of her symptoms and I did discuss this with pt today.  Has been to the therapist twice, has appointment with psychiatrist on 4/25. She sees her counselor again this week.  I did ask Ms. Halseth to sign a release form so I can view these records. The patient indicates understanding of these issues and agrees with the plan.

## 2017-03-26 NOTE — Progress Notes (Signed)
Subjective:   Patient ID: Ashley [name], female    DOB: 02-15-1966, 51 y.o.   MRN: [id number]  Ashley [name] is a pleasant 51 y.o. year old female who presents to clinic today with Hospitalization Follow-up ([place])  on 03/26/2017  HPI:  Presented to ER via EMS complaining of sudden onset of left sided HA (in note says right sided but pt insists it was left sided) on 03/19/17.  Notes reviewed.  She reported that she has been more headaches and synocopal episodes recently.  Of note, she has a h/o conversion and somatization disorders.  Has been seen by neuro who also agrees with these diagnoses and she has been referred to psych.   It appears she saw a counselor on 3/28 but I am unable to access the notes (they are locked).  In ER- UA, CBC,BMET were unremarkable.  EKG- NSR UDS ops for Benzos only which she is prescribed. Head CT unremarkable.  It was felt again that her neuro exam was embellished, inconsistent and non focal.  Given benadryl and tylenol and discharged home with follow up scheduled today with me.  Ct Head Wo Contrast  Result Date: 03/17/2017 CLINICAL DATA:  Headache, near syncope EXAM: CT HEAD WITHOUT CONTRAST TECHNIQUE: Contiguous axial images were obtained from the base of the skull through the vertex without intravenous contrast. COMPARISON:  02/12/2017 FINDINGS: Brain: No intracranial hemorrhage, mass effect or midline shift. No acute cortical infarction. No hydrocephalus. The gray and white-matter differentiation is preserved. No mass lesion is noted on this unenhanced scan. Vascular: No hyperdense vessel or unexpected calcification. Skull: Normal. Negative for fracture or focal lesion. Sinuses/Orbits: No acute findings Other: None IMPRESSION: No acute intracranial abnormality. No definite acute cortical infarction. Electronically Signed   By: Lahoma Crocker M.D.   On: 03/17/2017 21:57   Current Outpatient Prescriptions on File Prior to Visit  Medication Sig Dispense Refill    . amitriptyline (ELAVIL) 50 MG tablet Take 1-1/2 tablet at dinner time 45 tablet 3  . aspirin EC 81 MG tablet Take 81 mg by mouth daily.    . clonazePAM (KLONOPIN) 1 MG tablet TAKE 1 TABLET BY MOUTH THREE TIMES DAILY AS NEEDED FOR ANXIETY 90 tablet 0  . dicyclomine (BENTYL) 10 MG capsule TAKE ONE CAPSULE BY MOUTH TWICE DAILY AS NEEDED FOR  CRAMPING  AND  ABDOMINAL  PAIN 180 capsule 1  . docusate sodium (COLACE) 100 MG capsule Take 100 mg by mouth at bedtime.    . famotidine (PEPCID) 20 MG tablet One at bedtime (Patient taking differently: Take 20 mg by mouth at bedtime. One at bedtime) 30 tablet 11  . metFORMIN (GLUCOPHAGE) 500 MG tablet Take 1 tablet (500 mg total) by mouth daily with breakfast. 30 tablet 3  . omeprazole (PRILOSEC) 20 MG capsule Take 1 capsule (20 mg total) by mouth 2 (two) times daily. 180 capsule 1  . promethazine (PHENERGAN) 25 MG tablet Take 1 tablet (25 mg total) by mouth every 6 (six) hours as needed for nausea or vomiting. 30 tablet 0  . rizatriptan (MAXALT-MLT) 10 MG disintegrating tablet Take 1 tablet (10 mg total) by mouth as needed for migraine. May repeat in 2 hours if needed. Do not take more than 3 tablets in a week. 10 tablet 3  . traZODone (DESYREL) 150 MG tablet Take 1 tablet (150 mg total) by mouth at bedtime. 90 tablet 0  . furosemide (LASIX) 40 MG tablet Take 1 tablet (40 mg total) by mouth  daily. (Patient taking differently: Take 40 mg by mouth at bedtime. ) 30 tablet 6  . VENTOLIN HFA 108 (90 Base) MCG/ACT inhaler Inhale 2 puffs into the lungs daily as needed for shortness of breath.  0   Current Facility-Administered Medications on File Prior to Visit  Medication Dose Route Frequency Provider Last Rate Last Dose  . promethazine (PHENERGAN) tablet 25 mg  25 mg Oral Once Dorothyann Peng, NP        Allergies  Allergen Reactions  . Penicillins Anaphylaxis    Throat swells, Has patient had a PCN reaction causing immediate rash, facial/tongue/throat swelling,  SOB or lightheadedness with hypotension: Yes Has patient had a PCN reaction causing severe rash involving mucus membranes or skin necrosis: No Has patient had a PCN reaction that required hospitalization No Has patient had a PCN reaction occurring within the last 10 years: No If all of the above answers are "NO", then may proceed with Cephalosporin use.   . Topamax [Topiramate] Other (See Comments)    Hands and feet to go numb    Past Medical History:  Diagnosis Date  . Anxiety   . Bronchitis    uses inhaler if needed for bronchitis, lasted used -2014  . Cataract    bilateral  . Chronic headaches    migraines - in past, uses Phenergan for nausea   . Diabetes mellitus without complication ([place])    taken off Metformin since 04/2014, HgbA1C - normal, will follow up with PCP- Dr. Deborra Medina, 07/2014  . GERD (gastroesophageal reflux disease)   . H/O exercise stress test 2011   done at [facility]- told that it was WNL, done due to pt. having panic attacks   . History of blood transfusion 1966-09-19   at birth in [place], [place], unsure number of units  . Poor dentition    very poor oral health   . SVD (spontaneous vaginal delivery)    x 3  . Tobacco abuse     Past Surgical History:  Procedure Laterality Date  . ABDOMINAL HYSTERECTOMY    . APPENDECTOMY  1988  . CARDIAC CATHETERIZATION N/A 08/17/2016   Procedure: Left Heart Cath and Coronary Angiography;  Surgeon: Burnell Blanks, MD;  Location: [facility];  Service: Cardiovascular;  Laterality: N/A;  . CHOLECYSTECTOMY N/A 06/07/2014   Procedure: LAPAROSCOPIC CHOLECYSTECTOMY;  Surgeon: Ralene Ok, MD;  Location: [facility];  Service: General;  Laterality: N/A;  . CHOLECYSTECTOMY  June 07 2014  . COLONOSCOPY  02/16/2014   normal   . ESOPHAGOGASTRODUODENOSCOPY  12/28/2013  . KNEE ARTHROSCOPY  1995   left  . LAPAROSCOPIC ASSISTED VAGINAL HYSTERECTOMY N/A 04/15/2014   Procedure: LAPAROSCOPIC ASSISTED VAGINAL HYSTERECTOMY;  Surgeon:  Marylynn Pearson, MD;  Location: [facility];  Service: Gynecology;  Laterality: N/A;  . LAPAROSCOPIC BILATERAL SALPINGO OOPHERECTOMY Bilateral 04/15/2014   Procedure: LAPAROSCOPIC BILATERAL SALPINGO OOPHORECTOMY;  Surgeon: Marylynn Pearson, MD;  Location: [facility];  Service: Gynecology;  Laterality: Bilateral;  . TUBAL LIGATION      Family History  Problem Relation Age of Onset  . Diabetes Mother   . Hyperlipidemia Mother   . Hypertension Mother   . Prostate cancer Father   . Lung cancer Maternal Grandfather     smoked  . Emphysema Maternal Grandfather     smoked  . COPD Maternal Grandmother     never smoked   . Thyroid cancer Paternal Aunt   . Colon cancer Neg Hx   . Rectal cancer Neg Hx   .  Stomach cancer Neg Hx   . Migraines Neg Hx   . Dementia Neg Hx     Social History   Social History  . Marital status: Married    Spouse name: Alvester Chou  . Number of children: 6  . Years of education: 10   Occupational History  .       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 Smoking status: Current Every Day Smoker    Packs/day: 1.00    Years: 35.00    Types: Cigarettes, E-cigarettes  . Smokeless tobacco: Never Used  . Alcohol use No  . Drug use: No  . Sexual activity: Yes    Birth control/ protection: Pill   Other Topics Concern  . Not on file   Social History Narrative   5 living children, one child is a crack cocaine addict who often breaks into their house to steal money. One child died of cerebral palsy.   Caffeine use: Dr Malachi Bonds (3 per day)   2 cups coffee per day   The PMH, PSH, Social History, Family History, Medications, and allergies have been reviewed in [facility], and have been updated if relevant.   Review of Systems  Constitutional: Positive for appetite change.  HENT: Negative.   Respiratory: Negative.   Cardiovascular: Negative.   Gastrointestinal: Negative.   Endocrine: Negative.   Genitourinary: Negative.   Musculoskeletal: Negative.     Allergic/Immunologic: Negative.   Neurological: Positive for dizziness, syncope, weakness, light-headedness and headaches. Negative for tremors, seizures, facial asymmetry, speech difficulty and numbness.  Hematological: Negative.   Psychiatric/Behavioral: Negative.   All other systems reviewed and are negative.      Objective:    BP 108/78   Pulse 76   Wt 157 lb (71.2 kg)   LMP 02/12/2014   SpO2 98%   BMI 28.72 kg/m   Wt Readings from Last 3 Encounters:  03/26/17 157 lb (71.2 kg)  03/04/17 160 lb (72.6 kg)  02/27/17 158 lb 3.2 oz (71.8 kg)    Physical Exam  Constitutional: She is oriented to person, place, and time. She appears well-developed and well-nourished. No distress.  HENT:  Head: Normocephalic and atraumatic.  Eyes: Conjunctivae are normal.  Cardiovascular: Normal rate.   Pulmonary/Chest: Effort normal.  Musculoskeletal: Normal range of motion.  Neurological: She is alert and oriented to person, place, and time. No cranial nerve deficit.  Skin: Skin is warm and dry. She is not diaphoretic.  Psychiatric: She has a normal mood and affect. Her behavior is normal. Judgment and thought content normal.  Nursing note and vitals reviewed.         Assessment & Plan:   ADJUSTMENT DISORDER WITH MIXED FEATURES  Somatization disorder  Periodic headache syndrome, not intractable  Paresthesia No Follow-up on file.

## 2017-03-26 NOTE — Telephone Encounter (Signed)
Clonazepam called into [place], [place] - [address] Phone: [phone]

## 2017-03-28 ENCOUNTER — Telehealth: Payer: Self-pay | Admitting: Neurology

## 2017-03-28 NOTE — Telephone Encounter (Signed)
Agree with migraine cocktail, CD

## 2017-03-28 NOTE — Telephone Encounter (Signed)
Dr. Jaynee Eagles pt - had Botox treatment 02/14/2017. Requesting migraine cocktail.

## 2017-03-28 NOTE — Telephone Encounter (Signed)
F/u scheduled Tues, Apr 10th @ 10:15.

## 2017-03-28 NOTE — Telephone Encounter (Addendum)
Patient has had migraine since yesterday and would like to come in today for an infusion. I advised Dr. Jaynee Eagles is out of the office but will send message to the nurse.

## 2017-03-28 NOTE — Telephone Encounter (Addendum)
Discussed w/ Theresa Duty RN, who agreed to administer infusion tomorrow @ 9:30 if ok'd by MD.

## 2017-04-02 ENCOUNTER — Encounter: Payer: Self-pay | Admitting: Nurse Practitioner

## 2017-04-02 ENCOUNTER — Ambulatory Visit (INDEPENDENT_AMBULATORY_CARE_PROVIDER_SITE_OTHER): Payer: BLUE CROSS/BLUE SHIELD | Admitting: Nurse Practitioner

## 2017-04-02 VITALS — BP 124/80 | HR 81 | Wt 158.0 lb

## 2017-04-02 DIAGNOSIS — F450 Somatization disorder: Secondary | ICD-10-CM | POA: Diagnosis not present

## 2017-04-02 DIAGNOSIS — G43119 Migraine with aura, intractable, without status migrainosus: Secondary | ICD-10-CM | POA: Diagnosis not present

## 2017-04-02 NOTE — Patient Instructions (Addendum)
continue amitriptyline as ordered migraine preventive Continue Maxalt acutely Follow-up appointment for Botox Review migraine triggers, given a list Follow-up with psychiatry as planned and counselor Follow-up with Dr. Jaynee Eagles as planned

## 2017-04-02 NOTE — Progress Notes (Signed)
[facility]  PATIENT: Ashley [name] DOB: Jun 27, 1966   REASON FOR VISIT: Follow-up for migraine HISTORY FROM: Patient    HISTORY OF PRESENT ILLNESS: Update 04/02/2017 CM Ashley [name], 51 year old female returns for follow-up with a past history of chronic migraine. She came in to the office last Friday to give migraine cocktail which she claims helps. She has a mild headache today not enough to take any acute medication. She is currently on amitriptyline as a preventive 75 mg at bedtime in addition she is on Maxalt 10 acutely. She claims the Maxalt works. Her migraine triggers are sun light and fluorescent light. She is not aware of any foods. She gets Botox every 3 months. Her next Botox is in May. She complains of overall weakness. MRI of the lumbar spine performed 02/20/2017 was normal. CT of the head performed 03/17/2017 without acute intracranial abnormality. She is now seeing a counselor at [employer] and is due to see a psychiatrist later in the month. She returns for reevaluation    Interval history: 09/25/16 AA50 y.o. female here as a referral from Dr. Deborra Medina for new onset tremor. Past medical history of intractable migraine, prediabetes, depression, insomnia, anxiety, back pain, significant psychiatric stressors.. She has significant depression and anxiety and I have recommended psychiatric follow up in the past. She has been seen at [facility] for unilateral symptoms in the past by our stroke team and exam was consistent with inorganic causes possibly stress. My exams have also not been onsistent with organic cause, showing poor effort and give way on the right, splitting exactly midline with vibration and pinprick.   She has significant life stressors. Today she appears with acute onset tremor and weakness. Per Dr. Lorelei Pont, Over the course of the last few months she is seen multiple physicians including going to the hospital, going to the ER multiple times, and been  admitted with a cardiac catheterization(normal). She has gone from walking unassisted without any assistive devices to now walking with a walker with great difficulty and having significant difficulties with even basic strength in her extremities.   She has been having more weakness. Legs feel like they are rubber and weigh 100 pounds. Weak in the legs. The tremors are not there all the time. Tremors are when watching TV. And when smoking a cigarette she drops it. She says she has a tremor, no tremor today at rest, it is distractable. She has been to multiple doctors including tcardiac and pulmonology and nothing is wrong. Started in early august and getting worse. She has numbness and tingling in the feet which is new. Weakness in the legs and the arms. The right side is still worse. No diplopia, no facial weakness. Headaches are better.    Initial HPI 05/10/2016:Ashley [name] a 51 y.o.femalehere as a referral from Dr. Francella Solian once visit. Past medical history of intractable migraine, prediabetes, depression, insomnia, anxiety, back pain, significant psychiatric stressors. She was diagnosed with cluster headaches by Dr. Delice Lesch and is being treated with vearapamil, she also has migraines. She sees Dr. Delice Lesch and she will be going back, Dr. Delice Lesch is on maternity leave at this time. She is here for evaluation of TIA over she was seen in the emergency room. Exam was consistent with nonorganic features in the right upper and lower extremities with splitting of vibration at the forehead. MRI of the brain was negative and she was sent home. Patient is here with her husband today who provides information as well.  Patient says she ate a biscuit and then she started feelig weird and "numby and heavy" and the whole right side started feeling tingling. She still has the symptoms. She was seen in the emergency room, she was given shots. She was having headaches before this happened. No head trauma, symptoms  started on her couch about 11am acute onset. She has continued tingly feelings on the right side, she says the symptoms are exactly just on the right side of her body, she says if you drqw a line directly down the middle of her body fron the face to the chest abdomen and pelvis exactly down the middle the whole right side doesn't feel right. She has a daily continuous headache since this episode. She had started having a very bad headache before the right-sided symptoms. She is getting confused. She says she is having memory problems as well. Daily continuous headaches for years, +phono/photophobiua, +nausea.. The headaches on the right side, throbbing and stabbing, she has light sensitivity, sounds sensitivity, nausea, vomiting. She has to sit in a dark room. It is continuous. She has 30 headaches out of month continuous, 20 or more are migrainous, can be as bad as a 10/10 and on average it is 6/10. No aura. No medication overuse. Denies lacrimation, injection, other signs or symptoms of cluster at this time. The right-sided symptoms occur with the migraines.    REVIEW OF SYSTEMS: Full 14 system review of systems performed and notable only for those listed, all others are neg:  Constitutional: Fatigue  Cardiovascular: neg Ear/Nose/Throat: Ringing in the ears  Skin: neg Eyes: neg Respiratory: neg Gastroitestinal: neg  Hematology/Lymphatic: neg  Endocrine: Excessive thirst Musculoskeletal:neg Allergy/Immunology: neg Neurological: Headaches and weakness Psychiatric: Anxiety depression decreased concentration Sleep : neg   ALLERGIES: Allergies  Allergen Reactions  . Penicillins Anaphylaxis    Throat swells, Has patient had a PCN reaction causing immediate rash, facial/tongue/throat swelling, SOB or lightheadedness with hypotension: Yes Has patient had a PCN reaction causing severe rash involving mucus membranes or skin necrosis: No Has patient had a PCN reaction that required hospitalization  No Has patient had a PCN reaction occurring within the last 10 years: No If all of the above answers are "NO", then may proceed with Cephalosporin use.   . Topamax [Topiramate] Other (See Comments)    Hands and feet to go numb    HOME MEDICATIONS: Outpatient Medications Prior to Visit  Medication Sig Dispense Refill  . amitriptyline (ELAVIL) 50 MG tablet Take 1-1/2 tablet at dinner time 45 tablet 3  . aspirin EC 81 MG tablet Take 81 mg by mouth daily.    . clonazePAM (KLONOPIN) 1 MG tablet TAKE 1 TABLET BY MOUTH THREE TIMES DAILY AS NEEDED FOR ANXIETY 90 tablet 0  . dicyclomine (BENTYL) 10 MG capsule TAKE ONE CAPSULE BY MOUTH TWICE DAILY AS NEEDED FOR  CRAMPING  AND  ABDOMINAL  PAIN 180 capsule 1  . docusate sodium (COLACE) 100 MG capsule Take 100 mg by mouth at bedtime.    . famotidine (PEPCID) 20 MG tablet One at bedtime (Patient taking differently: Take 20 mg by mouth at bedtime. One at bedtime) 30 tablet 11  . metFORMIN (GLUCOPHAGE) 500 MG tablet Take 1 tablet (500 mg total) by mouth daily with breakfast. 30 tablet 3  . omeprazole (PRILOSEC) 20 MG capsule Take 1 capsule (20 mg total) by mouth 2 (two) times daily. 180 capsule 1  . promethazine (PHENERGAN) 25 MG tablet Take 1 tablet (25 mg total)  by mouth every 6 (six) hours as needed for nausea or vomiting. 30 tablet 0  . rizatriptan (MAXALT-MLT) 10 MG disintegrating tablet Take 1 tablet (10 mg total) by mouth as needed for migraine. May repeat in 2 hours if needed. Do not take more than 3 tablets in a week. 10 tablet 3  . traZODone (DESYREL) 150 MG tablet Take 1 tablet (150 mg total) by mouth at bedtime. 90 tablet 0  . VENTOLIN HFA 108 (90 Base) MCG/ACT inhaler Inhale 2 puffs into the lungs daily as needed for shortness of breath.  0  . furosemide (LASIX) 40 MG tablet Take 1 tablet (40 mg total) by mouth daily. (Patient taking differently: Take 40 mg by mouth at bedtime. ) 30 tablet 6   Facility-Administered Medications Prior to Visit    Medication Dose Route Frequency Provider Last Rate Last Dose  . promethazine (PHENERGAN) tablet 25 mg  25 mg Oral Once Dorothyann Peng, NP        PAST MEDICAL HISTORY: Past Medical History:  Diagnosis Date  . Anxiety   . Bronchitis    uses inhaler if needed for bronchitis, lasted used -2014  . Cataract    bilateral  . Chronic headaches    migraines - in past, uses Phenergan for nausea   . Diabetes mellitus without complication ([facility])    taken off Metformin since 04/2014, HgbA1C - normal, will follow up with PCP- Dr. Deborra Medina, 07/2014  . GERD (gastroesophageal reflux disease)   . H/O exercise stress test 2011   done at [facility]- told that it was WNL, done due to pt. having panic attacks   . History of blood transfusion Jan 09, 1966   at birth in [place], [place], unsure number of units  . Poor dentition    very poor oral health   . SVD (spontaneous vaginal delivery)    x 3  . Tobacco abuse     PAST SURGICAL HISTORY: Past Surgical History:  Procedure Laterality Date  . ABDOMINAL HYSTERECTOMY    . APPENDECTOMY  1988  . CARDIAC CATHETERIZATION N/A 08/17/2016   Procedure: Left Heart Cath and Coronary Angiography;  Surgeon: Burnell Blanks, MD;  Location: [facility];  Service: Cardiovascular;  Laterality: N/A;  . CHOLECYSTECTOMY N/A 06/07/2014   Procedure: LAPAROSCOPIC CHOLECYSTECTOMY;  Surgeon: Ralene Ok, MD;  Location: [facility];  Service: General;  Laterality: N/A;  . CHOLECYSTECTOMY  June 07 2014  . COLONOSCOPY  02/16/2014   normal   . ESOPHAGOGASTRODUODENOSCOPY  12/28/2013  . KNEE ARTHROSCOPY  1995   left  . LAPAROSCOPIC ASSISTED VAGINAL HYSTERECTOMY N/A 04/15/2014   Procedure: LAPAROSCOPIC ASSISTED VAGINAL HYSTERECTOMY;  Surgeon: Marylynn Pearson, MD;  Location: [facility];  Service: Gynecology;  Laterality: N/A;  . LAPAROSCOPIC BILATERAL SALPINGO OOPHERECTOMY Bilateral 04/15/2014   Procedure: LAPAROSCOPIC BILATERAL SALPINGO OOPHORECTOMY;  Surgeon: Marylynn Pearson, MD;   Location: [facility];  Service: Gynecology;  Laterality: Bilateral;  . TUBAL LIGATION      FAMILY HISTORY: Family History  Problem Relation Age of Onset  . Diabetes Mother   . Hyperlipidemia Mother   . Hypertension Mother   . Prostate cancer Father   . Lung cancer Maternal Grandfather     smoked  . Emphysema Maternal Grandfather     smoked  . COPD Maternal Grandmother     never smoked   . Thyroid cancer Paternal Aunt   . Colon cancer Neg Hx   . Rectal cancer Neg Hx   . Stomach cancer Neg Hx   .  Migraines Neg Hx   . Dementia Neg Hx     SOCIAL HISTORY: Social History   Social History  . Marital status: Married    Spouse name: Alvester Chou  . Number of children: 6  . Years of education: 49   Occupational History  .       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 Smoking status: Current Every Day Smoker    Packs/day: 1.00    Years: 35.00    Types: Cigarettes, E-cigarettes  . Smokeless tobacco: Never Used     Comment: 04/02/17 cigarettes 1.5 PPD  . Alcohol use No  . Drug use: No  . Sexual activity: Yes    Birth control/ protection: Pill   Other Topics Concern  . Not on file   Social History Narrative   5 living children, one child is a crack cocaine addict who often breaks into their house to steal money. One child died of cerebral palsy.   Caffeine use: Dr Malachi Bonds (3 per day)   2 cups coffee per day     PHYSICAL EXAM  Vitals:   04/02/17 1002  BP: 124/80  Pulse: 81  Weight: 158 lb (71.7 kg)   Body mass index is 28.9 kg/m.  Generalized: Well developed, Mildly obese female in no acute distress  Head: normocephalic and atraumatic,. Oropharynx benign  Neck: Supple, no carotid bruits  Cardiac: Regular rate rhythm, no murmur  Musculoskeletal: No deformity   Neurological examination   Mentation: Alert oriented to time, place, history taking. Attention span and concentration appropriate. Recent and remote memory intact.  Follows all commands  speech and language fluent.   Cranial nerve II-XII: Pupils were equal round reactive to light extraocular movements were full, visual field were full on confrontational test. Facial sensation and strength were normal. hearing was intact to finger rubbing bilaterally. Uvula tongue midline. head turning and shoulder shrug were normal and symmetric.Tongue protrusion into cheek strength was normal. Motor: Giveaway and poor effort but overall appears to have normal strength Sensory: Splits midline to pinprick and vibration  Coordination: finger-nose-finger, heel-to-shin bilaterally, no dysmetria Reflexes: Brisker upper and lower, plantar responses were flexor bilaterally. Gait and Station: Rising up from seated position without assistance, shuffling gait   moderate stride, good arm swing, smooth turning, able to perform tiptoe, and heel walking without difficulty. Tandem gait is unsteady  DIAGNOSTIC DATA (LABS, IMAGING, TESTING) - I reviewed patient records, labs, notes, testing and imaging myself where available.  Lab Results  Component Value Date   WBC 6.7 03/17/2017   HGB 13.1 03/17/2017   HCT 38.5 03/17/2017   MCV 90.4 03/17/2017   PLT 299 03/17/2017      Component Value Date/Time   NA 141 03/17/2017 2131   K 3.5 03/17/2017 2131   CL 102 03/17/2017 2131   CO2 29 03/17/2017 2131   GLUCOSE 146 (H) 03/17/2017 2131   BUN 10 03/17/2017 2131   CREATININE 0.84 03/17/2017 2131   CREATININE 1.13 (H) 08/15/2016 1107   CALCIUM 9.0 03/17/2017 2131   PROT 6.6 02/12/2017 1322   ALBUMIN 3.8 02/12/2017 1322   AST 18 02/12/2017 1322   ALT 16 02/12/2017 1322   ALKPHOS 81 02/12/2017 1322   BILITOT 0.7 02/12/2017 1322   GFRNONAA >60 03/17/2017 2131   GFRAA >60 03/17/2017 2131   Lab Results  Component Value Date   CHOL 166 12/19/2016   HDL 38.80 (L) 12/19/2016   LDLCALC 104 (H) 12/19/2016  LDLDIRECT 152.1 01/19/2013   TRIG 116.0 12/19/2016   CHOLHDL 4 12/19/2016   Lab Results  Component  Value Date   HGBA1C 6.4 12/26/2016   Lab Results  Component Value Date   VITAMINB12 338 05/10/2016   Lab Results  Component Value Date   TSH 1.35 12/19/2016      ASSESSMENT AND PLAN  51 y.o. year old female  has a past medical history of Anxiety; Intractable migraine prediabetes depression and insomnia anxiety back pain and significant psychiatric stressors.   PLAN; continue amitriptyline as ordered migraine preventive Continue Maxalt acutely Follow-up appointment for Botox in May  Review migraine triggers given list of foods and reviewed these eliminate one at a time it is problematic Continue follow-up with counselor and psychiatrist Follow-up with Dr. Jaynee Eagles as planned I spent 59min in total face to face time with the patient more than 50% of which was spent counseling and coordination of care, reviewing test results reviewing medications and discussing and reviewing the diagnosis of migraine and food triggers and further treatment options. Rayburn Ma, [facility], APRN  [facility] [address] [place], [place] [address] [phone]

## 2017-04-04 ENCOUNTER — Ambulatory Visit (INDEPENDENT_AMBULATORY_CARE_PROVIDER_SITE_OTHER): Payer: BLUE CROSS/BLUE SHIELD | Admitting: Licensed Clinical Social Worker

## 2017-04-04 DIAGNOSIS — F4323 Adjustment disorder with mixed anxiety and depressed mood: Secondary | ICD-10-CM | POA: Diagnosis not present

## 2017-04-08 ENCOUNTER — Encounter (HOSPITAL_COMMUNITY): Payer: Self-pay | Admitting: Licensed Clinical Social Worker

## 2017-04-08 ENCOUNTER — Ambulatory Visit (HOSPITAL_COMMUNITY): Payer: Self-pay | Admitting: Licensed Clinical Social Worker

## 2017-04-08 NOTE — Progress Notes (Signed)
   THERAPIST PROGRESS NOTE  Session Time: 11:10am-12:00pm  Participation Level: Active  Behavioral Response: CasualAlertDepressed  Type of Therapy: Individual Therapy  Treatment Goals addressed: Coping  Interventions: CBT  Summary: Ashley [name] is a 51 y.o. female who presents with depression and anxiety. Pt had a considerable amount of pain today. She used her walker and her left was "heavy" and she could not use it. Discussed the stress in pt's life trying to attribute it to her muscle weakness. Pt has her daughters' family living with she and h er husband. The 3 children are all undisciplined and this causes pt angst. PT is taking clonazapam in the morning and night. Today pt came in with her hair washed and moussed. Congratulated pt on her self care and encouraged her to continue working on her self care. Pt inquired about therapy for her grandchildren.but first must discuss it with daughter. Again role played boundaries that she can use with her daughter.Again talked about her own self care and what she can do for herself.Again, discussed strategies with pt to assist with her anxiety: breathing, grounding, meditation and mindfulness.Gave pt a handout on mindfulness exercises. Reviewed mindfulness with pt. Gave pt information on meditation. And showed pt some mindfulness meditation and body scans she can use. Pt was open to the suggestions during the intervention.  Suicidal/Homicidal: Nowithout intent/plan  Therapist Response: Evaluated pt for progress.   Plan: Return again in 2 weeks.  Diagnosis: Axis I:Adjustment disorder with mixed anxiety and depressed mood        Dionne Rossa S, LCAS  04/04/17

## 2017-04-17 ENCOUNTER — Encounter (HOSPITAL_COMMUNITY): Payer: Self-pay | Admitting: Psychiatry

## 2017-04-17 ENCOUNTER — Ambulatory Visit (INDEPENDENT_AMBULATORY_CARE_PROVIDER_SITE_OTHER): Payer: BLUE CROSS/BLUE SHIELD | Admitting: Psychiatry

## 2017-04-17 VITALS — BP 136/80 | HR 79 | Ht 62.0 in | Wt 159.0 lb

## 2017-04-17 DIAGNOSIS — Z813 Family history of other psychoactive substance abuse and dependence: Secondary | ICD-10-CM

## 2017-04-17 DIAGNOSIS — Z818 Family history of other mental and behavioral disorders: Secondary | ICD-10-CM | POA: Diagnosis not present

## 2017-04-17 DIAGNOSIS — F331 Major depressive disorder, recurrent, moderate: Secondary | ICD-10-CM

## 2017-04-17 DIAGNOSIS — F17210 Nicotine dependence, cigarettes, uncomplicated: Secondary | ICD-10-CM | POA: Diagnosis not present

## 2017-04-17 DIAGNOSIS — Z79899 Other long term (current) drug therapy: Secondary | ICD-10-CM

## 2017-04-17 DIAGNOSIS — Z811 Family history of alcohol abuse and dependence: Secondary | ICD-10-CM | POA: Diagnosis not present

## 2017-04-17 MED ORDER — VENLAFAXINE HCL ER 37.5 MG PO CP24: 37.5000 mg | ORAL_CAPSULE | Freq: Every day | ORAL | 1 refills | Status: DC

## 2017-04-17 NOTE — Patient Instructions (Signed)
Start effexor 37.5 mg tomorrow morning, and in 1 week, increase to 75 mg (2 capsules)  Decrease clonazepam to about 0.5 mg three times a day, OR 0.5 mg in the morning and 1 mg at night  Come back in 6 weeks
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
## 2017-04-18 ENCOUNTER — Ambulatory Visit (HOSPITAL_COMMUNITY): Payer: Self-pay | Admitting: Licensed Clinical Social Worker

## 2017-04-24 ENCOUNTER — Ambulatory Visit (INDEPENDENT_AMBULATORY_CARE_PROVIDER_SITE_OTHER): Payer: BLUE CROSS/BLUE SHIELD | Admitting: Licensed Clinical Social Worker

## 2017-04-24 DIAGNOSIS — F4323 Adjustment disorder with mixed anxiety and depressed mood: Secondary | ICD-10-CM | POA: Diagnosis not present

## 2017-04-25 ENCOUNTER — Telehealth: Payer: Self-pay | Admitting: Family Medicine

## 2017-04-25 ENCOUNTER — Observation Stay (HOSPITAL_COMMUNITY)
Admission: EM | Admit: 2017-04-25 | Discharge: 2017-04-26 | Disposition: A | Discharge status: Home / Self Care | Payer: BLUE CROSS/BLUE SHIELD | Attending: Internal Medicine | Admitting: Internal Medicine

## 2017-04-25 ENCOUNTER — Emergency Department (HOSPITAL_COMMUNITY): Payer: BLUE CROSS/BLUE SHIELD

## 2017-04-25 DIAGNOSIS — Z7982 Long term (current) use of aspirin: Secondary | ICD-10-CM | POA: Insufficient documentation

## 2017-04-25 DIAGNOSIS — Z79899 Other long term (current) drug therapy: Secondary | ICD-10-CM | POA: Diagnosis not present

## 2017-04-25 DIAGNOSIS — G8191 Hemiplegia, unspecified affecting right dominant side: Secondary | ICD-10-CM

## 2017-04-25 DIAGNOSIS — G43119 Migraine with aura, intractable, without status migrainosus: Secondary | ICD-10-CM | POA: Insufficient documentation

## 2017-04-25 DIAGNOSIS — F17210 Nicotine dependence, cigarettes, uncomplicated: Secondary | ICD-10-CM | POA: Diagnosis not present

## 2017-04-25 DIAGNOSIS — J42 Unspecified chronic bronchitis: Secondary | ICD-10-CM | POA: Diagnosis present

## 2017-04-25 DIAGNOSIS — R531 Weakness: Secondary | ICD-10-CM | POA: Diagnosis not present

## 2017-04-25 DIAGNOSIS — E119 Type 2 diabetes mellitus without complications: Secondary | ICD-10-CM | POA: Diagnosis not present

## 2017-04-25 DIAGNOSIS — Z720 Tobacco use: Secondary | ICD-10-CM | POA: Diagnosis not present

## 2017-04-25 DIAGNOSIS — R200 Anesthesia of skin: Principal | ICD-10-CM

## 2017-04-25 DIAGNOSIS — F411 Generalized anxiety disorder: Secondary | ICD-10-CM | POA: Diagnosis not present

## 2017-04-25 DIAGNOSIS — R29898 Other symptoms and signs involving the musculoskeletal system: Secondary | ICD-10-CM | POA: Diagnosis not present

## 2017-04-25 DIAGNOSIS — E118 Type 2 diabetes mellitus with unspecified complications: Secondary | ICD-10-CM | POA: Diagnosis not present

## 2017-04-25 DIAGNOSIS — F4323 Adjustment disorder with mixed anxiety and depressed mood: Secondary | ICD-10-CM | POA: Diagnosis present

## 2017-04-25 DIAGNOSIS — K219 Gastro-esophageal reflux disease without esophagitis: Secondary | ICD-10-CM | POA: Diagnosis not present

## 2017-04-25 DIAGNOSIS — E784 Other hyperlipidemia: Secondary | ICD-10-CM

## 2017-04-25 DIAGNOSIS — Z7984 Long term (current) use of oral hypoglycemic drugs: Secondary | ICD-10-CM | POA: Diagnosis not present

## 2017-04-25 DIAGNOSIS — F450 Somatization disorder: Secondary | ICD-10-CM | POA: Diagnosis not present

## 2017-04-25 DIAGNOSIS — E785 Hyperlipidemia, unspecified: Secondary | ICD-10-CM | POA: Diagnosis present

## 2017-04-25 LAB — COMPREHENSIVE METABOLIC PANEL
ALK PHOS: 91 U/L (ref 38–126)
ALT: 9 U/L — AB (ref 14–54)
AST: 17 U/L (ref 15–41)
Albumin: 3.3 g/dL — ABNORMAL LOW (ref 3.5–5.0)
Anion gap: 10 (ref 5–15)
BILIRUBIN TOTAL: 0.4 mg/dL (ref 0.3–1.2)
BUN: 10 mg/dL (ref 6–20)
CALCIUM: 8.6 mg/dL — AB (ref 8.9–10.3)
CO2: 23 mmol/L (ref 22–32)
CREATININE: 0.72 mg/dL (ref 0.44–1.00)
Chloride: 105 mmol/L (ref 101–111)
Glucose, Bld: 92 mg/dL (ref 65–99)
Potassium: 3.7 mmol/L (ref 3.5–5.1)
Sodium: 138 mmol/L (ref 135–145)
TOTAL PROTEIN: 6.6 g/dL (ref 6.5–8.1)

## 2017-04-25 LAB — DIFFERENTIAL
BASOS PCT: 0 %
Basophils Absolute: 0 10*3/uL (ref 0.0–0.1)
Eosinophils Absolute: 0.2 10*3/uL (ref 0.0–0.7)
Eosinophils Relative: 3 %
LYMPHS ABS: 2.3 10*3/uL (ref 0.7–4.0)
LYMPHS PCT: 42 %
MONO ABS: 0.6 10*3/uL (ref 0.1–1.0)
MONOS PCT: 11 %
NEUTROS ABS: 2.5 10*3/uL (ref 1.7–7.7)
Neutrophils Relative %: 44 %

## 2017-04-25 LAB — I-STAT TROPONIN, ED: TROPONIN I, POC: 0 ng/mL (ref 0.00–0.08)

## 2017-04-25 LAB — CBC
HEMATOCRIT: 39 % (ref 36.0–46.0)
Hemoglobin: 13.2 g/dL (ref 12.0–15.0)
MCH: 30.8 pg (ref 26.0–34.0)
MCHC: 33.8 g/dL (ref 30.0–36.0)
MCV: 91.1 fL (ref 78.0–100.0)
Platelets: 321 10*3/uL (ref 150–400)
RBC: 4.28 MIL/uL (ref 3.87–5.11)
RDW: 14.9 % (ref 11.5–15.5)
WBC: 5.6 10*3/uL (ref 4.0–10.5)

## 2017-04-25 LAB — GLUCOSE, CAPILLARY: GLUCOSE-CAPILLARY: 109 mg/dL — AB (ref 65–99)

## 2017-04-25 LAB — PROTIME-INR
INR: 1.01
Prothrombin Time: 13.3 seconds (ref 11.4–15.2)

## 2017-04-25 LAB — I-STAT CHEM 8, ED
BUN: 11 mg/dL (ref 6–20)
CALCIUM ION: 1.02 mmol/L — AB (ref 1.15–1.40)
CHLORIDE: 103 mmol/L (ref 101–111)
Creatinine, Ser: 0.7 mg/dL (ref 0.44–1.00)
GLUCOSE: 90 mg/dL (ref 65–99)
HCT: 39 % (ref 36.0–46.0)
Hemoglobin: 13.3 g/dL (ref 12.0–15.0)
Potassium: 3.6 mmol/L (ref 3.5–5.1)
Sodium: 139 mmol/L (ref 135–145)
TCO2: 26 mmol/L (ref 0–100)

## 2017-04-25 LAB — APTT: aPTT: 29 seconds (ref 24–36)

## 2017-04-25 LAB — ETHANOL

## 2017-04-25 MED ORDER — HYDROCODONE-ACETAMINOPHEN 5-325 MG PO TABS: 1.0000 | ORAL_TABLET | ORAL | Status: DC | PRN

## 2017-04-25 MED ORDER — ONDANSETRON HCL 4 MG/2ML IJ SOLN: 4.0000 mg | Freq: Four times a day (QID) | INTRAMUSCULAR | Status: DC | PRN

## 2017-04-25 MED ORDER — SODIUM CHLORIDE 0.9 % IV SOLN
INTRAVENOUS | Status: AC
  Administered 2017-04-25: 50 mL/h via INTRAVENOUS

## 2017-04-25 MED ORDER — TRAZODONE HCL 50 MG PO TABS
150.0000 mg | ORAL_TABLET | Freq: Every day | ORAL | Status: DC
  Administered 2017-04-25: 150 mg via ORAL
  Filled 2017-04-25: qty 1

## 2017-04-25 MED ORDER — ACETAMINOPHEN 650 MG RE SUPP: 650.0000 mg | Freq: Four times a day (QID) | RECTAL | Status: DC | PRN

## 2017-04-25 MED ORDER — ALBUTEROL SULFATE (2.5 MG/3ML) 0.083% IN NEBU
2.5000 mg | INHALATION_SOLUTION | Freq: Four times a day (QID) | RESPIRATORY_TRACT | Status: DC
  Administered 2017-04-25: 2.5 mg via RESPIRATORY_TRACT

## 2017-04-25 MED ORDER — CLONAZEPAM 0.5 MG PO TABS: 1.0000 mg | ORAL_TABLET | Freq: Three times a day (TID) | ORAL | Status: DC | PRN

## 2017-04-25 MED ORDER — ONDANSETRON HCL 4 MG PO TABS: 4.0000 mg | ORAL_TABLET | Freq: Four times a day (QID) | ORAL | Status: DC | PRN

## 2017-04-25 MED ORDER — PANTOPRAZOLE SODIUM 40 MG PO TBEC
40.0000 mg | DELAYED_RELEASE_TABLET | Freq: Every day | ORAL | Status: DC
  Administered 2017-04-25 – 2017-04-26 (×2): 40 mg via ORAL
  Filled 2017-04-25 (×2): qty 1

## 2017-04-25 MED ORDER — RIZATRIPTAN BENZOATE 10 MG PO TBDP: 10.0000 mg | ORAL_TABLET | ORAL | Status: DC | PRN

## 2017-04-25 MED ORDER — SENNOSIDES-DOCUSATE SODIUM 8.6-50 MG PO TABS: 1.0000 | ORAL_TABLET | Freq: Every evening | ORAL | Status: DC | PRN

## 2017-04-25 MED ORDER — ALBUTEROL SULFATE (2.5 MG/3ML) 0.083% IN NEBU: 3.0000 mL | INHALATION_SOLUTION | Freq: Every day | RESPIRATORY_TRACT | Status: DC | PRN

## 2017-04-25 MED ORDER — CLONAZEPAM 0.5 MG PO TABS: 0.5000 mg | ORAL_TABLET | Freq: Three times a day (TID) | ORAL | Status: DC | PRN

## 2017-04-25 MED ORDER — METFORMIN HCL 500 MG PO TABS
500.0000 mg | ORAL_TABLET | Freq: Every day | ORAL | Status: DC
  Administered 2017-04-26: 500 mg via ORAL
  Filled 2017-04-25: qty 1

## 2017-04-25 MED ORDER — ALBUTEROL SULFATE (2.5 MG/3ML) 0.083% IN NEBU
INHALATION_SOLUTION | RESPIRATORY_TRACT | Status: AC
  Filled 2017-04-25: qty 3

## 2017-04-25 MED ORDER — VENLAFAXINE HCL ER 37.5 MG PO CP24
37.5000 mg | ORAL_CAPSULE | Freq: Every day | ORAL | Status: DC
  Administered 2017-04-26: 37.5 mg via ORAL
  Filled 2017-04-25: qty 1

## 2017-04-25 MED ORDER — SODIUM CHLORIDE 0.9 % IV SOLN: Freq: Once | INTRAVENOUS | Status: DC

## 2017-04-25 MED ORDER — ASPIRIN EC 81 MG PO TBEC
81.0000 mg | DELAYED_RELEASE_TABLET | Freq: Every day | ORAL | Status: DC
  Administered 2017-04-25 – 2017-04-26 (×2): 81 mg via ORAL
  Filled 2017-04-25 (×2): qty 1

## 2017-04-25 MED ORDER — DOCUSATE SODIUM 100 MG PO CAPS
100.0000 mg | ORAL_CAPSULE | Freq: Every day | ORAL | Status: DC
  Administered 2017-04-25: 100 mg via ORAL
  Filled 2017-04-25: qty 1

## 2017-04-25 MED ORDER — ACETAMINOPHEN 325 MG PO TABS: 650.0000 mg | ORAL_TABLET | Freq: Four times a day (QID) | ORAL | Status: DC | PRN

## 2017-04-25 MED ORDER — INSULIN ASPART 100 UNIT/ML ~~LOC~~ SOLN: 0.0000 [IU] | Freq: Every day | SUBCUTANEOUS | Status: DC

## 2017-04-25 MED ORDER — FUROSEMIDE 40 MG PO TABS
40.0000 mg | ORAL_TABLET | Freq: Every day | ORAL | Status: DC
  Administered 2017-04-25: 40 mg via ORAL
  Filled 2017-04-25: qty 1

## 2017-04-25 MED ORDER — INSULIN ASPART 100 UNIT/ML ~~LOC~~ SOLN: 0.0000 [IU] | Freq: Three times a day (TID) | SUBCUTANEOUS | Status: DC

## 2017-04-25 MED ORDER — SUMATRIPTAN SUCCINATE 100 MG PO TABS
100.0000 mg | ORAL_TABLET | ORAL | Status: DC | PRN
  Filled 2017-04-25: qty 1

## 2017-04-25 MED ORDER — FAMOTIDINE 20 MG PO TABS
20.0000 mg | ORAL_TABLET | Freq: Every day | ORAL | Status: DC
  Administered 2017-04-25: 20 mg via ORAL
  Filled 2017-04-25: qty 1

## 2017-04-25 MED ORDER — AMITRIPTYLINE HCL 25 MG PO TABS
75.0000 mg | ORAL_TABLET | Freq: Every day | ORAL | Status: DC
  Administered 2017-04-25: 75 mg via ORAL
  Filled 2017-04-25: qty 3

## 2017-04-25 MED ORDER — ENOXAPARIN SODIUM 40 MG/0.4ML ~~LOC~~ SOLN
40.0000 mg | SUBCUTANEOUS | Status: DC
  Administered 2017-04-25: 40 mg via SUBCUTANEOUS
  Filled 2017-04-25: qty 0.4

## 2017-04-25 MED ORDER — ALBUTEROL SULFATE (2.5 MG/3ML) 0.083% IN NEBU: 2.5000 mg | INHALATION_SOLUTION | Freq: Four times a day (QID) | RESPIRATORY_TRACT | Status: DC | PRN

## 2017-04-25 NOTE — Progress Notes (Signed)
New Admission Note:  Arrival Method:  Stretcher; alert & oriented x 4 Telemetry:7m17 Assessment: Completed Skin: bruise on left hip IV: L hand/ saline locked  Pain:0/10 Safety Measures: Safety Fall Prevention Plan was given, discussed. Admission: Completed 68M: Patient has been orientated to the room, unit and the staff. Family: updated  Orders have been reviewed and implemented. Will continue to monitor the patient. Call light has been placed within reach and bed alarm has been activated.   Arta Silence ,RN

## 2017-04-25 NOTE — ED Provider Notes (Addendum)
[facility] DEPT Provider Note   CSN: [id number] Arrival date & time: 04/25/17  1332     History   Chief Complaint Chief Complaint  Patient presents with  . Code Stroke    HPI Ashley [name] is a 51 y.o. female.  Level V caveat for urgent need for intervention. Patient presents with right arm and bilateral leg numbness and weakness at approximately 11 AM today while watching TV. No change in mentation. A code stroke was initiated when she entered the emergency department.  No chest pain or dyspnea. Past medical history includes diabetes, smoking, headaches, anxiety, depression, adjustment disorder.      Past Medical History:  Diagnosis Date  . Anxiety   . Bronchitis    uses inhaler if needed for bronchitis, lasted used -2014  . Cataract    bilateral  . Chronic headaches    migraines - in past, uses Phenergan for nausea   . Diabetes mellitus without complication ([place])    taken off Metformin since 04/2014, HgbA1C - normal, will follow up with PCP- Dr. Deborra Medina, 07/2014  . GERD (gastroesophageal reflux disease)   . H/O exercise stress test 2011   done at [facility]- told that it was WNL, done due to pt. having panic attacks   . History of blood transfusion 08[birth year]07[birth year]66   at birth in [place], [place], unsure number of units  . Poor dentition    very poor oral health   . SVD (spontaneous vaginal delivery)    x 3  . Tobacco abuse     Patient Active Problem List   Diagnosis Date Noted  . 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 Paresthesia 03/04/2017  . Hospital discharge follow-up 02/19/2017  . Stroke-like symptom 02/19/2017  . Tobacco abuse 01/08/2017  . Diabetes ([place]) 12/26/2016  . Chronic bronchitis ([place]) 10/20/2015  . HLD (hyperlipidemia) 04/12/2015  . Migraine with aura, intractable 08/12/2014  . S/P hysterectomy with oophorectomy 04/15/2014  . Hyperglycemia 01/26/2013  . Migraine 05/30/2010  . Anxiety state 09/30/2009  . INSOMNIA, CHRONIC 09/30/2009  . ADJUSTMENT DISORDER  WITH MIXED FEATURES 09/30/2009  . ELEVATED BLOOD PRESSURE WITHOUT DIAGNOSIS OF HYPERTENSION 09/30/2009    Past Surgical History:  Procedure Laterality Date  . ABDOMINAL HYSTERECTOMY    . APPENDECTOMY  1988  . CARDIAC CATHETERIZATION N/A 08/17/2016   Procedure: Left Heart Cath and Coronary Angiography;  Surgeon: Burnell Blanks, MD;  Location: [facility];  Service: Cardiovascular;  Laterality: N/A;  . CHOLECYSTECTOMY N/A 06/07/2014   Procedure: LAPAROSCOPIC CHOLECYSTECTOMY;  Surgeon: Ralene Ok, MD;  Location: [facility];  Service: General;  Laterality: N/A;  . CHOLECYSTECTOMY  June 07 2014  . COLONOSCOPY  02/16/2014   normal   . ESOPHAGOGASTRODUODENOSCOPY  12/28/2013  . KNEE ARTHROSCOPY  1995   left  . LAPAROSCOPIC ASSISTED VAGINAL HYSTERECTOMY N/A 04/15/2014   Procedure: LAPAROSCOPIC ASSISTED VAGINAL HYSTERECTOMY;  Surgeon: Marylynn Pearson, MD;  Location: [facility];  Service: Gynecology;  Laterality: N/A;  . LAPAROSCOPIC BILATERAL SALPINGO OOPHERECTOMY Bilateral 04/15/2014   Procedure: LAPAROSCOPIC BILATERAL SALPINGO OOPHORECTOMY;  Surgeon: Marylynn Pearson, MD;  Location: [facility];  Service: Gynecology;  Laterality: Bilateral;  . TUBAL LIGATION      OB History    No data available       Home Medications    Prior to Admission medications   Medication Sig Start Date End Date Taking? Authorizing Provider  amitriptyline (ELAVIL) 50 MG tablet Take 1-1/2 tablet at dinner time 07/25/16   Lucille Passy, MD  aspirin EC 81 MG tablet Take 81 mg by mouth daily.    Historical Provider, MD  clonazePAM (KLONOPIN) 1 MG tablet TAKE 1 TABLET BY MOUTH THREE TIMES DAILY AS NEEDED FOR ANXIETY 03/25/17   Lucille Passy, MD  dicyclomine (BENTYL) 10 MG capsule TAKE ONE CAPSULE BY MOUTH TWICE DAILY AS NEEDED FOR  CRAMPING  AND  ABDOMINAL  PAIN 10/05/16   Lucille Passy, MD  docusate sodium (COLACE) 100 MG capsule Take 100 mg by mouth at bedtime.    Historical Provider, MD  famotidine (PEPCID) 20 MG  tablet One at bedtime Patient taking differently: Take 20 mg by mouth at bedtime. One at bedtime 08/29/16   Tanda Rockers, MD  furosemide (LASIX) 40 MG tablet Take 1 tablet (40 mg total) by mouth daily. Patient taking differently: Take 40 mg by mouth at bedtime.  08/10/16 03/17/17  Brittainy Erie Noe, PA-C  metFORMIN (GLUCOPHAGE) 500 MG tablet Take 1 tablet (500 mg total) by mouth daily with breakfast. 12/28/16   Lucille Passy, MD  omeprazole (PRILOSEC) 20 MG capsule Take 1 capsule (20 mg total) by mouth 2 (two) times daily. 09/20/16   Lucille Passy, MD  promethazine (PHENERGAN) 25 MG tablet Take 1 tablet (25 mg total) by mouth every 6 (six) hours as needed for nausea or vomiting. 12/26/16   Lucille Passy, MD  rizatriptan (MAXALT-MLT) 10 MG disintegrating tablet Take 1 tablet (10 mg total) by mouth as needed for migraine. May repeat in 2 hours if needed. Do not take more than 3 tablets in a week. 03/14/16   Pleas Koch, NP  traZODone (DESYREL) 150 MG tablet Take 1 tablet (150 mg total) by mouth at bedtime. 02/13/17   Lucille Passy, MD  venlafaxine XR (EFFEXOR XR) 37.5 MG 24 hr capsule Take 1 capsule (37.5 mg total) by mouth daily. Increase to 2 capsules in 1 week 04/17/17 04/17/18  Aundra Dubin, MD  VENTOLIN HFA 108 [phone] Base) MCG/ACT inhaler Inhale 2 puffs into the lungs daily as needed for shortness of breath. 01/02/17   Historical Provider, MD    Family History Family History  Problem Relation Age of Onset  . Diabetes Mother   . Hyperlipidemia Mother   . Hypertension Mother   . Anxiety disorder Mother   . Depression Mother   . Drug abuse Mother   . Prostate cancer Father   . Alcohol abuse Father   . Lung cancer Maternal Grandfather     smoked  . Emphysema Maternal Grandfather     smoked  . COPD Maternal Grandmother     never smoked   . Dementia Maternal Grandmother   . Thyroid cancer Paternal Aunt   . Colon cancer Neg Hx   . Rectal cancer Neg Hx   . Stomach cancer Neg Hx   .  Migraines Neg Hx     Social History Social History  Substance Use Topics  . Smoking status: Current Every Day Smoker    Packs/day: 1.00    Years: 35.00    Types: Cigarettes, E-cigarettes  . Smokeless tobacco: Never Used     Comment: 04/02/17 cigarettes 1.5 PPD  . Alcohol use No     Allergies   Penicillins and Topamax [topiramate]   Review of Systems Review of Systems  Reason unable to perform ROS: Urgent need for intervention.     Physical Exam Updated Vital Signs BP 133/70 (BP Location: Right Arm)   Pulse 66   Temp 98  F (36.7 C) (Oral)   Resp 14   LMP 02/12/2014   SpO2 97%   Physical Exam  Constitutional: She is oriented to person, place, and time. She appears well-developed and well-nourished.  HENT:  Head: Normocephalic and atraumatic.  Eyes: Conjunctivae are normal.  Neck: Neck supple.  Cardiovascular: Normal rate and regular rhythm.   Pulmonary/Chest: Effort normal and breath sounds normal.  Abdominal: Soft. Bowel sounds are normal.  Musculoskeletal: Normal range of motion.  Neurological: She is alert and oriented to person, place, and time.  Full range of motion of left arm. Patient has difficulty moving the right arm and both legs.  Skin: Skin is warm and dry.  Psychiatric: She has a normal mood and affect. Her behavior is normal.  Nursing note and vitals reviewed.    ED Treatments / Results  Labs (all labs ordered are listed, but only abnormal results are displayed) Labs Reviewed  COMPREHENSIVE METABOLIC PANEL - Abnormal; Notable for the following:       Result Value   Calcium 8.6 (*)    Albumin 3.3 (*)    ALT 9 (*)    All other components within normal limits  I-STAT CHEM 8, ED - Abnormal; Notable for the following:    Calcium, Ion 1.02 (*)    All other components within normal limits  ETHANOL  PROTIME-INR  APTT  CBC  DIFFERENTIAL  RAPID URINE DRUG SCREEN, HOSP PERFORMED  URINALYSIS, ROUTINE W REFLEX MICROSCOPIC  I-STAT TROPOININ, ED      EKG  EKG Interpretation None       Radiology Ct Head Code Stroke Wo Contrast  Result Date: 04/25/2017 CLINICAL DATA:  Code stroke. Right arm weakness and bilateral leg numbness. Blurry vision. EXAM: CT HEAD WITHOUT CONTRAST TECHNIQUE: Contiguous axial images were obtained from the base of the skull through the vertex without intravenous contrast. COMPARISON:  03/17/2017 FINDINGS: Brain: No evidence of acute infarction, hemorrhage, hydrocephalus, extra-axial collection or mass lesion/mass effect. Vascular: No hyperdense vessel or unexpected calcification. 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 Sinuses/Orbits: No acute finding. Other: These results were sent by text page on 04/25/2017 at 1:48 pm to Dr. Melba Coon. ASPECTS [facility] Stroke Program Early CT Score) - Ganglionic level infarction (caudate, lentiform nuclei, internal capsule, insula, M1-M3 cortex): 7 - Supraganglionic infarction (M4-M6 cortex): 3 Total score (0-10 with 10 being normal): 10 IMPRESSION: Negative head CT. ASPECTS is 10. Electronically Signed   By: Monte Fantasia M.D.   On: 04/25/2017 13:49    Procedures Procedures (including critical care time)  Medications Ordered in ED Medications  0.9 %  sodium chloride infusion (not administered)     Initial Impression / Assessment and Plan / ED Course  I have reviewed the triage vital signs and the nursing notes.  Pertinent labs & imaging results that were available during my care of the patient were reviewed by me and considered in my medical decision making (see chart for details).     Neurology consult obtained immediately. Neurologist did not think this was an acute event. Will admit to general medicine for observation.Behavioral health assessment ordered.  Final Clinical Impressions(s) / ED Diagnoses   Final diagnoses:  Numbness  Weakness    New Prescriptions New Prescriptions   No medications on file     Nat Christen,  MD 04/25/17 [place], MD 04/25/17 1524

## 2017-04-25 NOTE — Telephone Encounter (Signed)
Patient Name: Ashley [name]  DOB: [birth year]    Initial Comment Caller states, she is having right side arm numbness, and both legs are numb.   Nurse Assessment  Nurse: Raphael Gibney, RN, Vanita Ingles Date/Time (Eastern Time): 04/25/2017 12:45:37 PM  Confirm and document reason for call. If symptomatic, describe symptoms. ---Caller states she is having numbness in her right arm and both legs are numb. Has had symptoms for an hr.  Does the patient have any new or worsening symptoms? ---Yes  Will a triage be completed? ---Yes  Related visit to physician within the last 2 weeks? ---No  Does the PT have any chronic conditions? (i.e. diabetes, asthma, etc.) ---Yes  List chronic conditions. ---diabetes  Is the patient pregnant or possibly pregnant? (Ask all females between the ages of 48-55) ---No  Is this a behavioral health or substance abuse call? ---No     Guidelines    Guideline Title Affirmed Question Affirmed Notes  Neurologic Deficit [1] Numbness (i.e., loss of sensation) of the face, arm / hand, or leg / foot on one side of the body AND [2] sudden onset AND [3] present now    Final Disposition User   Call EMS 911 Now Raphael Gibney, RN, Vanita Ingles    Disagree/Comply: Leta Baptist

## 2017-04-25 NOTE — ED Triage Notes (Signed)
Pt arrived via [facility] after experiencing right-sided numbness and bilateral leg weakness. Pt stated she was watching tv and felt numbness as she sat the chair. Pt had no deficits, EMS noting only mild right sided weakness en route. Pt is alert and oriented x4. Speech is clear, denies headache, denies blurred vision.

## 2017-04-25 NOTE — Code Documentation (Signed)
51yo female arriving to [facility] via [facility] at 4.  Patient from home where she reports having pins and needle sensations in her right arm and bilateral legs at 1100.  Patient also reporting blurred vision.  Patient with h/o migraine with similar symptoms, however, patient denies headache today.  EMS reported RUE weakness and activated a code stroke.  Stroke team at the bedside on patient arrival.  Labs drawn and patient to CT with team.  CT completed.  NIHSS 2, see documentation for details and code stroke times.  Patient stated the month was "April" and she reports subjective decreased sensation in the RLE and denies any sensation in the RUE.  Dr. Shon Hale at the bedside for exam.  Code stroke canceled.  No acute stroke treatment at this time.  Bedside handoff with ED RN Legrand Como.

## 2017-04-25 NOTE — ED Notes (Signed)
Respiratory at bedside to give breathing tx

## 2017-04-25 NOTE — Consult Note (Signed)
Neurology Consult Note  Reason for Consultation: CODE STROKE  Requesting provider: Activated by EMS; ED attending [facility]  CC: Numbness in the right arm and both legs, blurry vision, right arm weakness  HPI: [name] who presents to the [facility] emergency department via EMS as a code stroke. History is obtained directly from the patient.  She reports that she was in her usual state of health, sitting watching TV at 11:00 today when she suddenly noticed tingling in the right arm and both legs. This was accompanied by blurry vision and difficulty moving her right arm. She states that she has had similar symptoms multiple times in the past, though this is always been associated with a headache. Today she doesn't have any headache. She called a nurse device line and was told to call 911. She called 911 and EMS arrived and noted right arm drift and complaints of numbness in both legs and the right arm. They activated code stroke and transported the patient to the emergency department.  I met the patient on her arrival in the ED with the rest of the stroke team. She was taken for emergent CT scan of the head which showed no acute abnormality. On examination, she had nonphysiologic findings including right arm and leg weakness and sensory loss in the right arm and both legs. NIH stroke score was 2 for incorrectly stating the month and for her sensory changes. Due to the nonphysiologic nature of her exam, it is felt that her presentation was not consistent with stroke. She was therefore deemed not to be a candidate for thrombolytic therapy. Code stroke was canceled.  Last known well: 1100 today NHISS score: 2 tPA given?: No, presentation not felt to be consistent with stroke  On reviewing her chart, she had a virtually identical presentation on 02/12/17--see my consult note from that date for full details.. Additional presentations were noted in June 2017 in May 2017, also  with similar symptoms. On all occasions, she was found to have nonphysiologic findings. Each time it was felt that her presentation was most likely psychogenic in nature. She has been seen by outpatient neurology, Ashley. Lavell [name], who is also noted significant embellishment on her physical exams.   PMH:  Past Medical History:  Diagnosis Date  . Anxiety   . Bronchitis    uses inhaler if needed for bronchitis, lasted used -2014  . Cataract    bilateral  . Chronic headaches    migraines - in past, uses Phenergan for nausea   . Diabetes mellitus without complication ([place])    taken off Metformin since 04/2014, HgbA1C - normal, will follow up with PCP- Ashley. Deborra [name], 07/2014  . GERD (gastroesophageal reflux disease)   . H/O exercise stress test 2011   done at [facility]- told that it was WNL, done due to pt. having panic attacks   . History of blood transfusion 11/19/66   at birth in [place], [place], unsure number of units  . Poor dentition    very poor oral health   . SVD (spontaneous vaginal delivery)    x 3  . Tobacco abuse     PSH:  Past Surgical History:  Procedure Laterality Date  . ABDOMINAL HYSTERECTOMY    . APPENDECTOMY  1988  . CARDIAC CATHETERIZATION N/A 08/17/2016   Procedure: Left Heart Cath and Coronary Angiography;  Surgeon: Ashley Blanks, MD;  Location: [place] [facility];  Service: Cardiovascular;  Laterality: N/A;  . CHOLECYSTECTOMY N/A  06/07/2014   Procedure: LAPAROSCOPIC CHOLECYSTECTOMY;  Surgeon: Ashley Ok, MD;  Location: [place];  Service: General;  Laterality: N/A;  . CHOLECYSTECTOMY  June 07 2014  . COLONOSCOPY  02/16/2014   normal   . ESOPHAGOGASTRODUODENOSCOPY  12/28/2013  . KNEE ARTHROSCOPY  1995   left  . LAPAROSCOPIC ASSISTED VAGINAL HYSTERECTOMY N/A 04/15/2014   Procedure: LAPAROSCOPIC ASSISTED VAGINAL HYSTERECTOMY;  Surgeon: Ashley Pearson, MD;  Location: [facility];  Service: Gynecology;  Laterality: N/A;  . LAPAROSCOPIC BILATERAL SALPINGO OOPHERECTOMY  Bilateral 04/15/2014   Procedure: LAPAROSCOPIC BILATERAL SALPINGO OOPHORECTOMY;  Surgeon: Ashley Pearson, MD;  Location: [facility];  Service: Gynecology;  Laterality: Bilateral;  . TUBAL LIGATION      Family history: Family History  Problem Relation Age of Onset  . Diabetes Mother   . Hyperlipidemia Mother   . Hypertension Mother   . Anxiety disorder Mother   . Depression Mother   . Drug abuse Mother   . Prostate cancer Father   . Alcohol abuse Father   . Lung cancer Maternal Grandfather     smoked  . Emphysema Maternal Grandfather     smoked  . COPD Maternal Grandmother     never smoked   . Dementia Maternal Grandmother   . Thyroid cancer Paternal Aunt   . Colon cancer Neg Hx   . Rectal cancer Neg Hx   . Stomach cancer Neg Hx   . Migraines Neg Hx     Social history:  Social History   Social History  . Marital status: Married    Spouse name: Ashley [name]  . Number of children: 6  . Years of education: 77   Occupational History  .       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 Smoking status: Current Every Day Smoker    Packs/day: 1.00    Years: 35.00    Types: Cigarettes, E-cigarettes  . Smokeless tobacco: Never Used     Comment: 04/02/17 cigarettes 1.5 PPD  . Alcohol use No  . Drug use: No  . Sexual activity: Yes    Partners: Male    Birth control/ protection: Surgical   Other Topics Concern  . Not on file   Social History Narrative   5 living children, one child is a crack cocaine addict who often breaks into their house to steal money. One child died of cerebral palsy.   Caffeine use: Ashley [name] (3 per day)   2 cups coffee per day    Current outpatient meds: Medications reviewed and reconciled Current Facility-Administered Medications for the 04/25/17 encounter [facility] Encounter)  Medication  . promethazine (PHENERGAN) tablet 25 mg   No outpatient prescriptions have been marked as taking for the 04/25/17 encounter [facility] Encounter).     Current inpatient meds: Medications reviewed and reconciled Current Facility-Administered Medications  Medication Dose Route Frequency Provider Last Rate Last Dose  . promethazine (PHENERGAN) tablet 25 mg  25 mg Oral Once Dorothyann Peng, NP       Current Outpatient Prescriptions  Medication Sig Dispense Refill  . amitriptyline (ELAVIL) 50 MG tablet Take 1-1/2 tablet at dinner time 45 tablet 3  . aspirin EC 81 MG tablet Take 81 mg by mouth daily.    . clonazePAM (KLONOPIN) 1 MG tablet TAKE 1 TABLET BY MOUTH THREE TIMES DAILY AS NEEDED FOR ANXIETY 90 tablet 0  . dicyclomine (BENTYL) 10 MG capsule TAKE ONE CAPSULE BY MOUTH TWICE DAILY AS NEEDED FOR  CRAMPING  AND  ABDOMINAL  PAIN 180 capsule 1  . docusate sodium (COLACE) 100 MG capsule Take 100 mg by mouth at bedtime.    . famotidine (PEPCID) 20 MG tablet One at bedtime (Patient taking differently: Take 20 mg by mouth at bedtime. One at bedtime) 30 tablet 11  . furosemide (LASIX) 40 MG tablet Take 1 tablet (40 mg total) by mouth daily. (Patient taking differently: Take 40 mg by mouth at bedtime. ) 30 tablet 6  . metFORMIN (GLUCOPHAGE) 500 MG tablet Take 1 tablet (500 mg total) by mouth daily with breakfast. 30 tablet 3  . omeprazole (PRILOSEC) 20 MG capsule Take 1 capsule (20 mg total) by mouth 2 (two) times daily. 180 capsule 1  . promethazine (PHENERGAN) 25 MG tablet Take 1 tablet (25 mg total) by mouth every 6 (six) hours as needed for nausea or vomiting. 30 tablet 0  . rizatriptan (MAXALT-MLT) 10 MG disintegrating tablet Take 1 tablet (10 mg total) by mouth as needed for migraine. May repeat in 2 hours if needed. Do not take more than 3 tablets in a week. 10 tablet 3  . traZODone (DESYREL) 150 MG tablet Take 1 tablet (150 mg total) by mouth at bedtime. 90 tablet 0  . venlafaxine XR (EFFEXOR XR) 37.5 MG 24 hr capsule Take 1 capsule (37.5 mg total) by mouth daily. Increase to 2 capsules in 1 week 90 capsule 1  . VENTOLIN HFA 108 (90 Base)  MCG/ACT inhaler Inhale 2 puffs into the lungs daily as needed for shortness of breath.  0    Allergies: Allergies  Allergen Reactions  . Penicillins Anaphylaxis    Throat swells, Has patient had a PCN reaction causing immediate rash, facial/tongue/throat swelling, SOB or lightheadedness with hypotension: Yes Has patient had a PCN reaction causing severe rash involving mucus membranes or skin necrosis: No Has patient had a PCN reaction that required hospitalization No Has patient had a PCN reaction occurring within the last 10 years: No If all of the above answers are "NO", then may proceed with Cephalosporin use.   . Topamax [Topiramate] Other (See Comments)    Hands and feet to go numb    ROS: As per HPI. A full 14-point review of systems was performed and is otherwise unremarkable.   PE:  BP 133/70 (BP Location: Right Arm)   Pulse 66   Temp 98 F (36.7 C) (Oral)   Resp 14   LMP 02/12/2014   SpO2 97%   General: WDWN, no acute distress. AAO x4. Speech clear, no dysarthria. No aphasia. Follows commands briskly. Affect is flat. Comportment is normal.  HEENT: Normocephalic. Neck supple without LAD. MMM, OP clear. Dentition good. Sclerae anicteric. No conjunctival injection.  CV: Regular, no murmur. Carotid pulses full and symmetric, no bruits. Distal pulses 2+ and symmetric.  Lungs: CTAB.  Abdomen: Soft, non-distended, non-tender. Bowel sounds present x4.  Extremities: No C/C/E. Neuro:  CN: Pupils are equal and round. They are symmetrically reactive from 3-->2 mm. Visual fields are full. EOMI without nystagmus. No reported diplopia. Facial sensation is intact to light touch. Face is symmetric at rest with normal strength and mobility. Hearing is intact to conversational voice. Palate elevates symmetrically and uvula is midline. Voice is normal in tone, pitch and quality. Bilateral SCM and trapezii are 5/5. Tongue is midline with normal bulk and mobility.  Motor: Normal bulk, tone,  and strength. Of note, she has functional weakness of the right side. She is unable to lift the arm  by herself but when I lift it for her she's been able to maintain it in the air without significant drift. On confrontational testing, she is any poor strength in the right arm. When I test the right arm and left arm together, the right arm suddenly become strong in the left arm weak. She has a positive Hoover's on the right. She has difficulty lifting the right leg off the bed to command but then is able to perform heel-and without difficulty. No tremor or other abnormal movements. No drift.  Sensation: She reports absent sensation in the right arm but is clearly able to feel when I touch her. When I touch the right arm alone she says that she cannot feel it. However, when I touch her right arm and left arm or right arm and right leg together, she says she feels that in the other limb but not in the right arm. She withdraws from noxious stimuli on the right side normally. DTRs: 2+, symmetric. Toes downgoing bilaterally. No pathologic reflexes.  Coordination: Finger-to-nose and heel-to-shin are without dysmetria.   Labs:  Lab Results  Component Value Date   WBC 5.6 04/25/2017   HGB 13.3 04/25/2017   HCT 39.0 04/25/2017   PLT 321 04/25/2017   GLUCOSE 90 04/25/2017   CHOL 166 12/19/2016   TRIG 116.0 12/19/2016   HDL 38.80 (L) 12/19/2016   LDLDIRECT 152.1 01/19/2013   LDLCALC 104 (H) 12/19/2016   ALT 16 02/12/2017   AST 18 02/12/2017   NA 139 04/25/2017   K 3.6 04/25/2017   CL 103 04/25/2017   CREATININE 0.70 04/25/2017   BUN 11 04/25/2017   CO2 29 03/17/2017   TSH 1.35 12/19/2016   INR 1.01 04/25/2017   HGBA1C 6.4 12/26/2016   MICROALBUR <0.7 12/26/2016    Imaging:  I have personally and independently reviewed CT scan of the head from today. This is normal.  Assessment and Plan:  1. 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 She has had  multiple presentations for the same symptoms, each time with a nonphysiologic exam. Unclear if this represents conversion versus malingering versus factitious disorder. Recommend psychiatric evaluation as outpatient. No further workup necessary from a neurologic standpoint.  2. Right hemisensory loss: As above, this is nonphysiologic and consistent with somatization. Recommend psychiatric evaluation as outpatient.  The patient was once again reassured that her symptoms are not consistent with stroke.  I discussed the above findings and impression with the ED attending, Ashley. Lacinda Axon. At this point I have no further recommendations. The patient can be discharged home from a neurology perspective.

## 2017-04-25 NOTE — H&P (Signed)
History and Physical    Ashley [name] DOB: [birth year] DOA: 04/25/2017  PCP: Arnette Norris, MD Patient coming from: home  Chief Complaint: Bilateral lower extremity numbness  HPI: Ashley [name] is a 51 y.o. female with medical history significant for tobacco use, GERD, diabetes, chronic migraines, anxiety, adjustment disorder with mixed features, anxiety state, somatotype sensation disorder presents to emergency Department chief complaint bilateral lower extremity numbness. A code stroke was initiated and subsequently canceled by neurology after evaluation.  Information is obtained from the patient. She reports being in her usual state of health until this morning she developed right arm and bilateral leg numbness. She denies headache dizziness syncope or near-syncope. She denies he difficulty chewing or swallowing. She does report some weakness of her lower extremities as well. She denies chest pain palpitation shortness of breath diaphoresis nausea vomiting. She denies any gait disturbance but does note in the past when she "bear weight I pass out". She denies fever chills dysuria hematuria frequency or urgency. She denies any abdominal pain constipation diarrhea melena bright red blood per rectum   ED Course: In the emergency department she's afebrile hemodynamically stable and not hypoxic.  Review of Systems: As per HPI otherwise all other systems reviewed and are negative.   Ambulatory Status: Ambulates independently is independent with ADLs  Past Medical History:  Diagnosis Date  . Anxiety   . Bronchitis    uses inhaler if needed for bronchitis, lasted used -2014  . Cataract    bilateral  . Chronic headaches    migraines - in past, uses Phenergan for nausea   . Diabetes mellitus without complication ([place])    taken off Metformin since 04/2014, HgbA1C - normal, will follow up with PCP- Dr. Deborra Medina, 07/2014  . GERD (gastroesophageal reflux disease)   . H/O exercise stress test  2011   done at [facility]- told that it was WNL, done due to pt. having panic attacks   . History of blood transfusion [birth year], 1966   at birth in [place], [place], unsure number of units  . Poor dentition    very poor oral health   . SVD (spontaneous vaginal delivery)    x 3  . Tobacco abuse     Past Surgical History:  Procedure Laterality Date  . ABDOMINAL HYSTERECTOMY    . APPENDECTOMY  1988  . CARDIAC CATHETERIZATION N/A 08/17/2016   Procedure: Left Heart Cath and Coronary Angiography;  Surgeon: Burnell Blanks, MD;  Location: [facility];  Service: Cardiovascular;  Laterality: N/A;  . CHOLECYSTECTOMY N/A 06/07/2014   Procedure: LAPAROSCOPIC CHOLECYSTECTOMY;  Surgeon: Ralene Ok, MD;  Location: [facility];  Service: General;  Laterality: N/A;  . CHOLECYSTECTOMY  June 07 2014  . COLONOSCOPY  02/16/2014   normal   . ESOPHAGOGASTRODUODENOSCOPY  12/28/2013  . KNEE ARTHROSCOPY  1995   left  . LAPAROSCOPIC ASSISTED VAGINAL HYSTERECTOMY N/A 04/15/2014   Procedure: LAPAROSCOPIC ASSISTED VAGINAL HYSTERECTOMY;  Surgeon: Marylynn Pearson, MD;  Location: [facility];  Service: Gynecology;  Laterality: N/A;  . LAPAROSCOPIC BILATERAL SALPINGO OOPHERECTOMY Bilateral 04/15/2014   Procedure: LAPAROSCOPIC BILATERAL SALPINGO OOPHORECTOMY;  Surgeon: Marylynn Pearson, MD;  Location: [facility];  Service: Gynecology;  Laterality: Bilateral;  . TUBAL LIGATION      Social History   Social History  . Marital status: Married    Spouse name: Alvester Chou  . Number of children: 6  . Years of education: 68   Occupational History  .  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 Smoking status: Current Every Day Smoker    Packs/day: 1.00    Years: 35.00    Types: Cigarettes, E-cigarettes  . Smokeless tobacco: Never Used     Comment: 04/02/17 cigarettes 1.5 PPD  . Alcohol use No  . Drug use: No  . Sexual activity: Yes    Partners: Male    Birth control/ protection: Surgical   Other  Topics Concern  . Not on file   Social History Narrative   5 living children, one child is a crack cocaine addict who often breaks into their house to steal money. One child died of cerebral palsy.   Caffeine use: Dr Malachi Bonds (3 per day)   2 cups coffee per day    Allergies  Allergen Reactions  . Penicillins Anaphylaxis    Throat swells, Has patient had a PCN reaction causing immediate rash, facial/tongue/throat swelling, SOB or lightheadedness with hypotension: Yes Has patient had a PCN reaction causing severe rash involving mucus membranes or skin necrosis: No Has patient had a PCN reaction that required hospitalization No Has patient had a PCN reaction occurring within the last 10 years: No If all of the above answers are "NO", then may proceed with Cephalosporin use.   . Topamax [Topiramate] Other (See Comments)    Hands and feet to go numb    Family History  Problem Relation Age of Onset  . Diabetes Mother   . Hyperlipidemia Mother   . Hypertension Mother   . Anxiety disorder Mother   . Depression Mother   . Drug abuse Mother   . Prostate cancer Father   . Alcohol abuse Father   . Lung cancer Maternal Grandfather     smoked  . Emphysema Maternal Grandfather     smoked  . COPD Maternal Grandmother     never smoked   . Dementia Maternal Grandmother   . Thyroid cancer Paternal Aunt   . Colon cancer Neg Hx   . Rectal cancer Neg Hx   . Stomach cancer Neg Hx   . Migraines Neg Hx     Prior to Admission medications   Medication Sig Start Date End Date Taking? Authorizing Provider  amitriptyline (ELAVIL) 50 MG tablet Take 1-1/2 tablet at dinner time Patient taking differently: Take 75 mg by mouth at bedtime. Take 1-1/2 tablet at dinner time 07/25/16  Yes Lucille Passy, MD  aspirin EC 81 MG tablet Take 81 mg by mouth daily.   Yes Historical Provider, MD  clonazePAM (KLONOPIN) 1 MG tablet TAKE 1 TABLET BY MOUTH THREE TIMES DAILY AS NEEDED FOR ANXIETY Patient taking  differently: TAKE 1/2 TABLET BY MOUTH EVERY MORNING AND AFTERNOON, 1 TABLET IN THE EVENING 03/25/17  Yes Lucille Passy, MD  dicyclomine (BENTYL) 10 MG capsule TAKE ONE CAPSULE BY MOUTH TWICE DAILY AS NEEDED FOR  CRAMPING  AND  ABDOMINAL  PAIN 10/05/16  Yes Lucille Passy, MD  docusate sodium (COLACE) 100 MG capsule Take 100 mg by mouth at bedtime.   Yes Historical Provider, MD  famotidine (PEPCID) 20 MG tablet One at bedtime Patient taking differently: Take 20 mg by mouth at bedtime. One at bedtime 08/29/16  Yes Tanda Rockers, MD  furosemide (LASIX) 40 MG tablet Take 1 tablet (40 mg total) by mouth daily. Patient taking differently: Take 40 mg by mouth at bedtime.  08/10/16 04/25/17 Yes Brittainy Erie Noe, PA-C  metFORMIN (GLUCOPHAGE) 500 MG tablet  Take 1 tablet (500 mg total) by mouth daily with breakfast. 12/28/16  Yes Lucille Passy, MD  omeprazole (PRILOSEC) 20 MG capsule Take 1 capsule (20 mg total) by mouth 2 (two) times daily. 09/20/16  Yes Lucille Passy, MD  promethazine (PHENERGAN) 25 MG tablet Take 1 tablet (25 mg total) by mouth every 6 (six) hours as needed for nausea or vomiting. 12/26/16  Yes Lucille Passy, MD  rizatriptan (MAXALT-MLT) 10 MG disintegrating tablet Take 1 tablet (10 mg total) by mouth as needed for migraine. May repeat in 2 hours if needed. Do not take more than 3 tablets in a week. 03/14/16  Yes Pleas Koch, NP  traZODone (DESYREL) 150 MG tablet Take 1 tablet (150 mg total) by mouth at bedtime. 02/13/17  Yes Lucille Passy, MD  venlafaxine XR (EFFEXOR XR) 37.5 MG 24 hr capsule Take 1 capsule (37.5 mg total) by mouth daily. Increase to 2 capsules in 1 week 04/17/17 04/17/18 Yes Aundra Dubin, MD  VENTOLIN HFA 108 [phone] Base) MCG/ACT inhaler Inhale 2 puffs into the lungs daily as needed for shortness of breath. 01/02/17  Yes Historical Provider, MD    Physical Exam: Vitals:   04/25/17 1615 04/25/17 1630 04/25/17 1645 04/25/17 1650  BP: 132/73 132/71 136/70   Pulse: 71 68 65     Resp: 12 (!) 23 (!) 24   Temp:    98.2 F (36.8 C)  TempSrc:      SpO2: 97% 95% 97%      General:  Appears calm and comfortable Sitting up in bed watching TV no acute distress Eyes:  PERRL, EOMI, normal lids, iris ENT:  grossly normal hearing, lips & tongue, mucus membranes of her mouth are moist and pink very poor dentition Neck:  no LAD, masses or thyromegaly Cardiovascular:  RRR, no m/r/g. No LE edema. Pedal pulses present and palpable Respiratory:  CTA bilaterally, no w/r/r. Normal respiratory effort. Abdomen:  soft, ntnd, positive bowel sounds throughout no guarding or rebounding Skin:  no rash or induration seen on limited exam Musculoskeletal:  grossly normal tone BUE/BLE, good ROM, no bony abnormality Psychiatric:  grossly normal mood and affect, speech fluent and appropriate, AOx3 Neurologic:  CN 2-12 grossly intact, moves all extremities in coordinated fashion, sensation intact,  facial symmetry speech clear right grip 4 out of 5 left grip 5 out of 5 lower extremity strength 5/5 bilaterally  Labs on Admission: I have personally reviewed following labs and imaging studies  CBC:  Recent Labs Lab 04/25/17 1335 04/25/17 1341  WBC 5.6  --   NEUTROABS 2.5  --   HGB 13.2 13.3  HCT 39.0 39.0  MCV 91.1  --   PLT 321  --    Basic Metabolic Panel:  Recent Labs Lab 04/25/17 1335 04/25/17 1341  NA 138 139  K 3.7 3.6  CL 105 103  CO2 23  --   GLUCOSE 92 90  BUN 10 11  CREATININE 0.72 0.70  CALCIUM 8.6*  --    GFR: Estimated Creatinine Clearance: 77.4 mL/min (by C-G formula based on SCr of 0.7 mg/dL). Liver Function Tests:  Recent Labs Lab 04/25/17 1335  AST 17  ALT 9*  ALKPHOS 91  BILITOT 0.4  PROT 6.6  ALBUMIN 3.3*   No results for input(s): LIPASE, AMYLASE in the last 168 hours. No results for input(s): AMMONIA in the last 168 hours. Coagulation Profile:  Recent Labs Lab 04/25/17 1335  INR 1.01   Cardiac  Enzymes: No results for input(s):  CKTOTAL, CKMB, CKMBINDEX, TROPONINI in the last 168 hours. BNP (last 3 results)  Recent Labs  07/25/16 1315  PROBNP 12.0   HbA1C: No results for input(s): HGBA1C in the last 72 hours. CBG: No results for input(s): GLUCAP in the last 168 hours. Lipid Profile: No results for input(s): CHOL, HDL, LDLCALC, TRIG, CHOLHDL, LDLDIRECT in the last 72 hours. Thyroid Function Tests: No results for input(s): TSH, T4TOTAL, FREET4, T3FREE, THYROIDAB in the last 72 hours. Anemia Panel: No results for input(s): VITAMINB12, FOLATE, FERRITIN, TIBC, IRON, RETICCTPCT in the last 72 hours. Urine analysis:    Component Value Date/Time   COLORURINE YELLOW 03/17/2017 2209   APPEARANCEUR HAZY (A) 03/17/2017 2209   LABSPEC 1.016 03/17/2017 2209   PHURINE 6.0 03/17/2017 2209   GLUCOSEU NEGATIVE 03/17/2017 2209   HGBUR SMALL (A) 03/17/2017 2209   BILIRUBINUR NEGATIVE 03/17/2017 2209   BILIRUBINUR negative 10/29/2016 1254   KETONESUR NEGATIVE 03/17/2017 2209   PROTEINUR NEGATIVE 03/17/2017 2209   UROBILINOGEN 0.2 10/29/2016 1254   UROBILINOGEN 0.2 04/30/2014 1242   NITRITE NEGATIVE 03/17/2017 2209   LEUKOCYTESUR NEGATIVE 03/17/2017 2209    Creatinine Clearance: Estimated Creatinine Clearance: 77.4 mL/min (by C-G formula based on SCr of 0.7 mg/dL).  Sepsis Labs: @LABRCNTIP (procalcitonin:4,lacticidven:4) )No results found for this or any previous visit (from the past 240 hour(s)).   Radiological Exams on Admission: Ct Head Code Stroke Wo Contrast  Result Date: 04/25/2017 CLINICAL DATA:  Code stroke. Right arm weakness and bilateral leg numbness. Blurry vision. EXAM: CT HEAD WITHOUT CONTRAST TECHNIQUE: Contiguous axial images were obtained from the base of the skull through the vertex without intravenous contrast. COMPARISON:  03/17/2017 FINDINGS: Brain: No evidence of acute infarction, hemorrhage, hydrocephalus, extra-axial collection or mass lesion/mass effect. Vascular: No hyperdense vessel or  unexpected calcification. 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 Sinuses/Orbits: No acute finding. Other: These results were sent by text page on 04/25/2017 at 1:48 pm to Dr. Melba Coon. ASPECTS [facility] Stroke Program Early CT Score) - Ganglionic level infarction (caudate, lentiform nuclei, internal capsule, insula, M1-M3 cortex): 7 - Supraganglionic infarction (M4-M6 cortex): 3 Total score (0-10 with 10 being normal): 10 IMPRESSION: Negative head CT. ASPECTS is 10. Electronically Signed   By: Monte Fantasia M.D.   On: 04/25/2017 13:49    EKG: Independently reviewed. Sinus rhythm   Assessment/Plan Principal Problem:   Bilateral leg numbness Active Problems:   Anxiety state   ADJUSTMENT DISORDER WITH MIXED FEATURES   HLD (hyperlipidemia)   Chronic bronchitis (HCC)   Diabetes (HCC)   Tobacco abuse   Somatization disorder   Generalized weakness   #1. Bilateral leg numbness with right hemiparesis. Initially code stroke called and canceled. CT of the head negative. She was evaluated by neurology who opines examination nonphysiologic and consistent with some medicine as patient. Neuro consult note indicates patient has had multiple presentations for same symptoms each time with a nonphysiologic exam. May be conversion versus malingering versus factitious disorder and recommended psych consult. No further neuro workup necessary per note -Admit for observation -Have requested Behavioral Health consult but this could be done outpatient -Continue home meds  #2. Generalized weakness. Related to above. -orthostatic vs -Physical therapy  #3. Diabetes. Diet controlled. Serum glucose 90 on admission  #4. Tobacco use -Cessation counseling offered  #5. Somatic disorder. Under care of [place]. Of note neurology noted that patient had first red likely presentation February 2018 June 2017 and May  2017. On all occasion she was found to have a nonphysiologic findings.  Each time it was felt that her presentation was most likely psychogenic in nature. She is also followed by neurology who has noted significant embellishment on her physical exam    DVT prophylaxis: scd  Code Status: full Family Communication: none present Disposition Plan: home hopefully in am  Consults called: [place]  Admission status: obs    Radene Gunning MD [facility]  If 7PM-7AM, please contact night-coverage [email or web address] Password [email or web address]  04/25/2017, 4:51 PM

## 2017-04-25 NOTE — BHH Counselor (Signed)
TTS consult was ordered for pt at 1523. Writer spoke w/ [place] who reports pt is being transferred to a medical floor. A TTS consult is no longer warranted. Pt is now on consult log to be seen by psychiatry.  Arnold Long, [facility] Therapeutic Triage Specialist

## 2017-04-26 ENCOUNTER — Encounter (HOSPITAL_COMMUNITY): Payer: Self-pay | Admitting: *Deleted

## 2017-04-26 DIAGNOSIS — F450 Somatization disorder: Secondary | ICD-10-CM | POA: Diagnosis not present

## 2017-04-26 LAB — GLUCOSE, CAPILLARY
Glucose-Capillary: 111 mg/dL — ABNORMAL HIGH (ref 65–99)
Glucose-Capillary: 108 mg/dL — ABNORMAL HIGH (ref 65–99)
Glucose-Capillary: 84 mg/dL (ref 65–99)

## 2017-04-26 LAB — URINALYSIS, ROUTINE W REFLEX MICROSCOPIC
Bilirubin Urine: NEGATIVE
GLUCOSE, UA: NEGATIVE mg/dL
Hgb urine dipstick: NEGATIVE
KETONES UR: NEGATIVE mg/dL
LEUKOCYTES UA: NEGATIVE
Nitrite: NEGATIVE
PH: 6 (ref 5.0–8.0)
Protein, ur: NEGATIVE mg/dL
SPECIFIC GRAVITY, URINE: 1.008 (ref 1.005–1.030)

## 2017-04-26 LAB — RAPID URINE DRUG SCREEN, HOSP PERFORMED
Amphetamines: NOT DETECTED
Barbiturates: NOT DETECTED
Benzodiazepines: NOT DETECTED
Cocaine: NOT DETECTED
OPIATES: NOT DETECTED
TETRAHYDROCANNABINOL: NOT DETECTED

## 2017-04-26 NOTE — Care Management Note (Signed)
Case Management Note  Patient Details  Name: Ashley [name] MRN: [id number] Date of Birth: 12-27-1965  Subjective/Objective:                    Action/Plan: Pt discharging home with orders for [facility] services. CM met with the patient and provided her a list of [facility] agencies. She selected [facility]. Santiago Glad with [facility] notified and accepted the referral. Pt states her husband will provide transportation home.   Expected Discharge Date:  04/26/17               Expected Discharge Plan:  [place]  In-House Referral:     Discharge planning Services  CM Consult  Post Acute Care Choice:  Home Health Choice offered to:  Patient  DME Arranged:    DME Agency:     HH Arranged:  PT [place]:  [place]  Status of Service:  Completed, signed off  If discussed at [place] of Stay Meetings, dates discussed:    Additional Comments:  Pollie Friar, RN 04/26/2017, 3:29 PM

## 2017-04-26 NOTE — Progress Notes (Signed)
RN discussed discharge instructions with patient. She verbalized understanding of discharge instructions including f/u psychiatrist, medications. Neuro assessment unchanged. Will continue to monitor until patient's husband arrives for transportation home

## 2017-04-26 NOTE — Discharge Summary (Signed)
[facility]  Physician Discharge Summary   Patient ID: Ashley [name] MRN: [id number] DOB/AGE: 51-03-1966 51 y.o.  Admit date: 04/25/2017 Discharge date: 04/27/2017  PCP: Lucille Passy, MD  DISCHARGE DIAGNOSES:  Principal Problem:   Bilateral leg numbness Active Problems:   Anxiety state   ADJUSTMENT DISORDER WITH MIXED FEATURES   HLD (hyperlipidemia)   Chronic bronchitis (HCC)   Diabetes (HCC)   Tobacco abuse   Somatization disorder   Generalized weakness   RECOMMENDATIONS FOR OUTPATIENT FOLLOW UP: 1. Patient to follow-up with her outpatient psychiatrist  DISCHARGE CONDITION: fair  Diet recommendation: As before  Filed Weights   04/25/17 1742  Weight: 72.6 kg (160 lb)    INITIAL HISTORY: 51 y.o. female with medical history significant for tobacco use, GERD, diabetes, chronic migraines, anxiety, adjustment disorder with mixed features, anxiety state, somatotype sensation disorder presents to emergency Department chief complaint bilateral lower extremity numbness, along with right-sided weakness. A code stroke was initiated and subsequently canceled by neurology after evaluation.  Consultations:  Neurology  Procedures:  None  HOSPITAL COURSE:   Bilateral leg numbness with right hemiparesis Initially code stroke called and canceled. CT of the head negative. She was evaluated by neurology who opines examination nonphysiologic and consistent with somatization. Neuro consult note indicates patient has had multiple presentations for same symptoms each time with a nonphysiologic exam. May be conversion versus malingering versus factitious disorder and recommended psych consult. Neurology noted that patient had an identical presentation earlier this year. Also presented similarly in June and May 2017. Patient has also been seen by outpatient neurology. No further neuro workup necessary per them. Physical therapy to see the patient. She may need home health. She is able  to move her right side better today.  Somatic disorder Patient is followed by a psychologist as well as a psychiatrist, Dr. Daron Offer. Discussed briefly with Dr. Louretta Shorten with psychiatry. He recommends that the patient should follow-up with her own psychiatrist. No suicidal or homicidal ideation at present. No need for inpatient psychiatric consultation. She may continue her home medications as before. Discussed with patient and she is agreeable.  Generalized weakness Related to above. Seen by physical therapy. Home health ordered.  Diabetes mellitus type 2  This is diet-controlled.   Tobacco use Cessation counseling offered  Patient was discharged in stable condition.   PERTINENT LABS:  The results of significant diagnostics from this hospitalization (including imaging, microbiology, ancillary and laboratory) are listed below for reference.     Labs: Basic Metabolic Panel:  Recent Labs Lab 04/25/17 1335 04/25/17 1341  NA 138 139  K 3.7 3.6  CL 105 103  CO2 23  --   GLUCOSE 92 90  BUN 10 11  CREATININE 0.72 0.70  CALCIUM 8.6*  --    Liver Function Tests:  Recent Labs Lab 04/25/17 1335  AST 17  ALT 9*  ALKPHOS 91  BILITOT 0.4  PROT 6.6  ALBUMIN 3.3*   CBC:  Recent Labs Lab 04/25/17 1335 04/25/17 1341  WBC 5.6  --   NEUTROABS 2.5  --   HGB 13.2 13.3  HCT 39.0 39.0  MCV 91.1  --   PLT 321  --    CBG:  Recent Labs Lab 04/25/17 2245 04/26/17 0626 04/26/17 1153 04/26/17 1638  GLUCAP 109* 111* 108* 84     IMAGING STUDIES Ct Head Code Stroke Wo Contrast  Result Date: 04/25/2017 CLINICAL DATA:  Code stroke. Right arm weakness and bilateral leg numbness. Blurry vision.  EXAM: CT HEAD WITHOUT CONTRAST TECHNIQUE: Contiguous axial images were obtained from the base of the skull through the vertex without intravenous contrast. COMPARISON:  03/17/2017 FINDINGS: Brain: No evidence of acute infarction, hemorrhage, hydrocephalus, extra-axial collection or  mass lesion/mass effect. Vascular: No hyperdense vessel or unexpected calcification. 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 Sinuses/Orbits: No acute finding. Other: These results were sent by text page on 04/25/2017 at 1:48 pm to Dr. Melba Coon. ASPECTS [facility]: [facility] Stroke Program Early CT Score) - Ganglionic level infarction (caudate, lentiform nuclei, internal capsule, insula, M1-M3 cortex): 7 - Supraganglionic infarction (M4-M6 cortex): 3 Total score (0-10 with 10 being normal): 10 IMPRESSION: Negative head CT. ASPECTS is 10. Electronically Signed   By: Monte Fantasia M.D.   On: 04/25/2017 13:49    DISCHARGE EXAMINATION: Vitals:   04/26/17 0035 04/26/17 0431 04/26/17 0926 04/26/17 1353  BP: 123/70 110/69 (!) 94/57 (!) 89/54  Pulse: 80 61 68 74  Resp: 16 16 18 16   Temp: 97.7 F (36.5 C) 97.7 F (36.5 C) 97.9 F (36.6 C) 97.9 F (36.6 C)  TempSrc: Oral Oral Oral Axillary  SpO2: 97% 96% 95% 97%  Weight:      Height:       General appearance: alert, cooperative, appears stated age and no distress Resp: clear to auscultation bilaterally Cardio: regular rate and rhythm, S1, S2 normal, no murmur, click, rub or gallop GI: soft, non-tender; bowel sounds normal; no masses,  no organomegaly Extremities: extremities normal, atraumatic, no cyanosis or edema Able to move her right upper and lower extremities. No cranial nerve deficits noted.  DISPOSITION: Home  Discharge Instructions    Call MD for:  difficulty breathing, headache or visual disturbances    Complete by:  As directed    Call MD for:  extreme fatigue    Complete by:  As directed    Call MD for:  persistant dizziness or light-headedness    Complete by:  As directed    Call MD for:  persistant nausea and vomiting    Complete by:  As directed    Call MD for:  severe uncontrolled pain    Complete by:  As directed    Call MD for:  temperature >100.4    Complete by:  As directed    Discharge  instructions    Complete by:  As directed    Please follow up with your Psychiatrist in 1 week.  You were cared for by a hospitalist during your hospital stay. If you have any questions about your discharge medications or the care you received while you were in the hospital after you are discharged, you can call the unit and asked to speak with the hospitalist on call if the hospitalist that took care of you is not available. Once you are discharged, your primary care physician will handle any further medical issues. Please note that NO REFILLS for any discharge medications will be authorized once you are discharged, as it is imperative that you return to your primary care physician (or establish a relationship with a primary care physician if you do not have one) for your aftercare needs so that they can reassess your need for medications and monitor your lab values. If you do not have a primary care physician, you can call [phone] for a physician referral.   Increase activity slowly    Complete by:  As directed       ALLERGIES:  Allergies  Allergen Reactions  .  Penicillins Anaphylaxis    Throat swells, Has patient had a PCN reaction causing immediate rash, facial/tongue/throat swelling, SOB or lightheadedness with hypotension: Yes Has patient had a PCN reaction causing severe rash involving mucus membranes or skin necrosis: No Has patient had a PCN reaction that required hospitalization No Has patient had a PCN reaction occurring within the last 10 years: No If all of the above answers are "NO", then may proceed with Cephalosporin use.   . Topamax [Topiramate] Other (See Comments)    Hands and feet to go numb     Discharge Medication List as of 04/26/2017  3:33 PM    CONTINUE these medications which have NOT CHANGED   Details  amitriptyline (ELAVIL) 50 MG tablet Take 1-1/2 tablet at dinner time, Normal    aspirin EC 81 MG tablet Take 81 mg by mouth daily., Historical Med      clonazePAM (KLONOPIN) 1 MG tablet TAKE 1 TABLET BY MOUTH THREE TIMES DAILY AS NEEDED FOR ANXIETY, Phone In    dicyclomine (BENTYL) 10 MG capsule TAKE ONE CAPSULE BY MOUTH TWICE DAILY AS NEEDED FOR  CRAMPING  AND  ABDOMINAL  PAIN, Normal    docusate sodium (COLACE) 100 MG capsule Take 100 mg by mouth at bedtime., Historical Med    famotidine (PEPCID) 20 MG tablet One at bedtime, Normal    furosemide (LASIX) 40 MG tablet Take 1 tablet (40 mg total) by mouth daily., Starting Fri 08/10/2016, Until Thu 04/25/2017, Normal    metFORMIN (GLUCOPHAGE) 500 MG tablet Take 1 tablet (500 mg total) by mouth daily with breakfast., Starting Fri 12/28/2016, Normal    omeprazole (PRILOSEC) 20 MG capsule Take 1 capsule (20 mg total) by mouth 2 (two) times daily., Starting Thu 09/20/2016, Normal    promethazine (PHENERGAN) 25 MG tablet Take 1 tablet (25 mg total) by mouth every 6 (six) hours as needed for nausea or vomiting., Starting Wed 12/26/2016, Phone In    rizatriptan (MAXALT-MLT) 10 MG disintegrating tablet Take 1 tablet (10 mg total) by mouth as needed for migraine. May repeat in 2 hours if needed. Do not take more than 3 tablets in a week., Starting 03/14/2016, Until Discontinued, Normal    traZODone (DESYREL) 150 MG tablet Take 1 tablet (150 mg total) by mouth at bedtime., Starting Wed 02/13/2017, Phone In    venlafaxine XR (EFFEXOR XR) 37.5 MG 24 hr capsule Take 1 capsule (37.5 mg total) by mouth daily. Increase to 2 capsules in 1 week, Starting Wed 04/17/2017, Until Thu 04/17/2018, Normal    VENTOLIN HFA 108 (90 Base) MCG/ACT inhaler Inhale 2 puffs into the lungs daily as needed for shortness of breath., Starting Wed 01/02/2017, Historical Med         Follow-up Information    Eksir, Richard Miu, MD. Schedule an appointment as soon as possible for a visit in 1 week(s).   Specialty:  Psychiatry Contact information: [place] [address] [phone]           TOTAL DISCHARGE  TIME: 38 minutes  [facility] Pager [phone]  04/27/2017, 2:05 PM

## 2017-04-26 NOTE — Evaluation (Signed)
Physical Therapy Evaluation Patient Details Name: Ashley [name] MRN: [id number] DOB: 17-Feb-1966 Today's Date: 04/26/2017   History of Present Illness  Pt is a 51 y/o female admitted secondary to bilateral LE numbness and weakness. Pt initially a code stroke but was cancelled after neurological exam. Pt now ? for conversion vs malingering vs factitious. PMH including but not limited to DM, Chronic migraines and anxiety.  Clinical Impression  Pt presented supine in bed with HOB elevated, awake and willing to participate in therapy session. Prior to admission, pt reported a six month history of use of RW to ambulate and requiring assistance from husband for ADLs. Pt reported that she lives with her husband, and that her children and several grandchildren have moved into their home approximately six months ago (around the this time her symptoms began). Pt able to ambulate within her room with use of RW and min guard for safety. Pt would continue to benefit from skilled physical therapy services at this time while admitted and after d/c to address the below listed limitations in order to improve overall safety and independence with functional mobility.     Follow Up Recommendations Home health PT    Equipment Recommendations  None recommended by PT    Recommendations for Other Services       Precautions / Restrictions Precautions Precautions: Fall Restrictions Weight Bearing Restrictions: No      Mobility  Bed Mobility Overal bed mobility: Needs Assistance Bed Mobility: Supine to Sit     Supine to sit: Min guard     General bed mobility comments: increased time and effort, min guard for safety  Transfers Overall transfer level: Needs assistance Equipment used: Rolling walker (2 wheeled) Transfers: Sit to/from Stand Sit to Stand: Min guard         General transfer comment: increased time, min guard for safety  Ambulation/Gait Ambulation/Gait assistance: Min guard Ambulation  Distance (Feet): 30 Feet Assistive device: Rolling walker (2 wheeled) Gait Pattern/deviations: Step-through pattern;Decreased step length - right;Decreased step length - left;Decreased stride length Gait velocity: decreased Gait velocity interpretation: Below normal speed for age/gender General Gait Details: mild instability but no LOB or need for physical assistance, min guard for safety. pt demonstrated safe use of RW  Stairs            Wheelchair Mobility    Modified Rankin (Stroke Patients Only)       Balance Overall balance assessment: Needs assistance Sitting-balance support: Feet supported Sitting balance-Leahy Scale: Fair     Standing balance support: During functional activity;Bilateral upper extremity supported Standing balance-Leahy Scale: Poor Standing balance comment: pt reliant on bilateral UEs on RW                             Pertinent Vitals/Pain Pain Assessment: No/denies pain    Home Living Family/patient expects to be discharged to:: Private residence Living Arrangements: Spouse/significant other;Children;Other relatives Available Help at Discharge: Family;Available 24 hours/day Type of Home: House Home Access: Level entry     Home Layout: One level Home Equipment: Walker - 2 wheels      Prior Function Level of Independence: Needs assistance   Gait / Transfers Assistance Needed: ambulates with RW or "furniture surfs"  ADL's / Homemaking Assistance Needed: Pt's husband assists with bathing, pt can dress herself.         Hand Dominance   Dominant Hand: Right    Extremity/Trunk Assessment   Upper Extremity  Assessment Upper Extremity Assessment: Generalized weakness;RUE deficits/detail RUE Deficits / Details: pt with greatly limited active and passive ROM of shoulder flexion and shoulder abduction (AROM flexion = 45 degrees with pain; PROM flexion = 90 degrees with pain; AROM abduction = 20 degrees with pain; PROM abduction =  45 degrees with pain) RUE: Unable to fully assess due to pain    Lower Extremity Assessment Lower Extremity Assessment: Generalized weakness;RLE deficits/detail;LLE deficits/detail RLE Deficits / Details: pt reports numb, tingling sensation from knee distally. with sensation testing, pt reports impaired sensation to light touch on R LE compared to L LE.       Communication   Communication: No difficulties  Cognition Arousal/Alertness: Awake/alert Behavior During Therapy: Flat affect Overall Cognitive Status: Within Functional Limits for tasks assessed                                        General Comments      Exercises     Assessment/Plan    PT Assessment Patient needs continued PT services  PT Problem List Decreased strength;Decreased activity tolerance;Decreased mobility;Decreased balance;Decreased coordination;Impaired sensation       PT Treatment Interventions DME instruction;Gait training;Stair training;Functional mobility training;Therapeutic activities;Therapeutic exercise;Balance training;Neuromuscular re-education;Patient/family education    PT Goals (Current goals can be found in the Care Plan section)  Acute Rehab PT Goals Patient Stated Goal: return to independence PT Goal Formulation: With patient Time For Goal Achievement: 05/10/17 Potential to Achieve Goals: Good    Frequency Min 3X/week   Barriers to discharge        Co-evaluation               AM-PAC PT "6 Clicks" Daily Activity  Outcome Measure Difficulty turning over in bed (including adjusting bedclothes, sheets and blankets)?: A Little Difficulty moving from lying on back to sitting on the side of the bed? : A Little Difficulty sitting down on and standing up from a chair with arms (e.g., wheelchair, bedside commode, etc,.)?: Total Help needed moving to and from a bed to chair (including a wheelchair)?: A Little Help needed walking in hospital room?: A Little Help  needed climbing 3-5 steps with a railing? : A Little 6 Click Score: 16    End of Session Equipment Utilized During Treatment: Gait belt Activity Tolerance: Patient tolerated treatment well Patient left: in chair;with call bell/phone within reach Nurse Communication: Mobility status PT Visit Diagnosis: Unsteadiness on feet (R26.81);Other abnormalities of gait and mobility (R26.89)    Time: 7989-2119 PT Time Calculation (min) (ACUTE ONLY): 24 min   Charges:   PT Evaluation $PT Eval Moderate Complexity: 1 Procedure     PT G Codes:   PT G-Codes **NOT FOR INPATIENT CLASS** Functional Assessment Tool Used: AM-PAC 6 Clicks Basic Mobility;Clinical judgement Functional Limitation: Mobility: Walking and moving around Mobility: Walking and Moving Around Current Status (E1740): At least 20 percent but less than 40 percent impaired, limited or restricted Mobility: Walking and Moving Around Goal Status [phone]): 0 percent impaired, limited or restricted    [facility], PT, DPT [place] 04/26/2017, 2:17 PM

## 2017-04-29 ENCOUNTER — Encounter (HOSPITAL_COMMUNITY): Payer: Self-pay | Admitting: Licensed Clinical Social Worker

## 2017-04-29 NOTE — Progress Notes (Signed)
   THERAPIST PROGRESS NOTE  Session Time: 9:10am-10am  Participation Level: Active  Behavioral Response: CasualAlertDepressed  Type of Therapy: Individual Therapy  Treatment Goals addressed: Coping  Interventions: CBT  Summary: Ashley [name] is a 51 y.o. female who presents with depression and anxiety.  Pt Discussed her psychiatric symptoms and current life events. Discussed pt's present based communication patterns that she uses with her daughter. Pt reports these patterns are ineffective in talking with her daughter. Asked open-ended questions about what communication techniques that have worked in the past. Processed with pt how using her boundaries may get her what she wants in the relationship; she may feel less taken advantage of. Role played boundaries with pt. It's obvious pt is not comfortable using boundaries. Discussedhealthy coping skills with pt.     Suicidal/Homicidal: Nowithout intent/plan  Therapist Response: Evaluated pt's current functioning and reviewed progress. Assisted pt processing communication techniques, boundaries. Assisted pt processing for the management of her stressors.   Plan: Return again in 2 weeks.  Diagnosis: Axis I:Adjustment disorder with mixed anxiety and depressed mood        Scherrie Seneca S, LCAS  04/24/17

## 2017-05-02 ENCOUNTER — Ambulatory Visit (INDEPENDENT_AMBULATORY_CARE_PROVIDER_SITE_OTHER): Payer: BLUE CROSS/BLUE SHIELD | Admitting: Licensed Clinical Social Worker

## 2017-05-02 ENCOUNTER — Encounter (HOSPITAL_COMMUNITY): Payer: Self-pay | Admitting: Licensed Clinical Social Worker

## 2017-05-02 DIAGNOSIS — F4323 Adjustment disorder with mixed anxiety and depressed mood: Secondary | ICD-10-CM

## 2017-05-02 NOTE — Progress Notes (Signed)
   THERAPIST PROGRESS NOTE  Session Time: 9:10am-10am  Participation Level: Active  Behavioral Response: CasualAlertDepressed  Type of Therapy: Individual Therapy  Treatment Goals addressed: Coping  Interventions: CBT  Summary: Ashley [name] is a 52 y.o. female who presents with depression and anxiety.  Pt Discussed her psychiatric symptoms and current life events.Pt has been to the ED again for her continued symptoms of tingling and numbness of her left side. Each time dr Reubin Milan not find anything medically wrong with her. Asked open ended questions about what she believes the cause of her ailments are. Pt has been told that it's "all in her head" by doctors and now she's beginning to believe it. Discussed basic CBT concepts with pt and the thought emotion connection. Pt has beliefs from her childhood that are ingrained. Discussed alternative perspectives by challenging those beliefs. Encouraged pt to continue her self care, by taking time to herself during the stress at home due to her daughter and her family living with them. Pt felt more hopeful upon leaving the session.   Suicidal/Homicidal: Nowithout intent/plan  Therapist Response: Evaluated pt's current functioning and reviewed progress. Assisted pt processing her medical conditions, CBt concepts, challenging childhood beliefs. Assisted pt processing for the management of her stressors.   Plan: Return again in 2 weeks.  Diagnosis: Axis I:Adjustment disorder with mixed anxiety and depressed mood        Chantal Worthey S, LCAS  05/02/17

## 2017-05-02 NOTE — Progress Notes (Signed)
Personally  participated in, made any corrections needed, and agree with history, physical, neuro exam,assessment and plan as stated above.    Chrishawna Farina, MD [facility] 

## 2017-05-13 ENCOUNTER — Other Ambulatory Visit: Payer: Self-pay

## 2017-05-13 ENCOUNTER — Other Ambulatory Visit: Payer: Self-pay | Admitting: Family Medicine

## 2017-05-13 ENCOUNTER — Telehealth: Payer: Self-pay | Admitting: *Deleted

## 2017-05-13 DIAGNOSIS — R51 Headache: Principal | ICD-10-CM

## 2017-05-13 DIAGNOSIS — R519 Headache, unspecified: Secondary | ICD-10-CM

## 2017-05-13 DIAGNOSIS — G44011 Episodic cluster headache, intractable: Secondary | ICD-10-CM

## 2017-05-13 MED ORDER — CLONAZEPAM 1 MG PO TABS: 1.0000 mg | ORAL_TABLET | Freq: Three times a day (TID) | ORAL | 0 refills | Status: DC | PRN

## 2017-05-13 MED ORDER — TRAZODONE HCL 150 MG PO TABS: 150.0000 mg | ORAL_TABLET | Freq: Every day | ORAL | 0 refills | Status: DC

## 2017-05-13 NOTE — Telephone Encounter (Signed)
Spoke to Rolfe at [facility] who states she was to see pt for PT for weakness. She has been unable to make contact with pt after multiple attempts and wanted to advise Dr Deborra Medina, they will be cancelling orders.

## 2017-05-13 NOTE — Telephone Encounter (Signed)
Called in to [place], [place] - [address]: [phone]

## 2017-05-13 NOTE — Telephone Encounter (Signed)
Pt left v/m requesting refills on clonazepam (last refilled # 90 on 03/25/17) and trazodone (# 90 on 02/13/17). Pt last seen 03/26/2017. [facility] [place]

## 2017-05-13 NOTE — Telephone Encounter (Signed)
Last refill 07/25/16 #45 +3, last OV 03/26/17

## 2017-05-16 ENCOUNTER — Ambulatory Visit (INDEPENDENT_AMBULATORY_CARE_PROVIDER_SITE_OTHER): Payer: BLUE CROSS/BLUE SHIELD | Admitting: Psychiatry

## 2017-05-16 ENCOUNTER — Telehealth: Payer: Self-pay | Admitting: Neurology

## 2017-05-16 ENCOUNTER — Ambulatory Visit (INDEPENDENT_AMBULATORY_CARE_PROVIDER_SITE_OTHER): Payer: BLUE CROSS/BLUE SHIELD | Admitting: Neurology

## 2017-05-16 VITALS — BP 111/76 | HR 70 | Ht 62.0 in | Wt 160.4 lb

## 2017-05-16 DIAGNOSIS — Z811 Family history of alcohol abuse and dependence: Secondary | ICD-10-CM | POA: Diagnosis not present

## 2017-05-16 DIAGNOSIS — G43711 Chronic migraine without aura, intractable, with status migrainosus: Secondary | ICD-10-CM | POA: Diagnosis not present

## 2017-05-16 DIAGNOSIS — F4310 Post-traumatic stress disorder, unspecified: Secondary | ICD-10-CM

## 2017-05-16 DIAGNOSIS — F331 Major depressive disorder, recurrent, moderate: Secondary | ICD-10-CM | POA: Diagnosis not present

## 2017-05-16 DIAGNOSIS — Z818 Family history of other mental and behavioral disorders: Secondary | ICD-10-CM | POA: Diagnosis not present

## 2017-05-16 DIAGNOSIS — Z813 Family history of other psychoactive substance abuse and dependence: Secondary | ICD-10-CM

## 2017-05-16 DIAGNOSIS — F17210 Nicotine dependence, cigarettes, uncomplicated: Secondary | ICD-10-CM

## 2017-05-16 NOTE — Progress Notes (Signed)
[facility] MD/PA/NP OP Progress Note  05/16/2017 4:14 PM DEVINE DANT  MRN:  [id number]  Chief Complaint:  Subjective:  [name] presents today for med management follow-up. She is proud to share that she has been working on setting boundaries with her children, and with her husband. I heard about some of the examples of her setting boundaries and standing up for herself, and applauded her efforts. She has done a nice job working in therapy to recognize situations where she needs to speak up for herself. She reports that she has tolerated Effexor without any significant issue, perhaps some sedation, so she might try taking it at night.  We discussed further tapering of clonazepam during the day, and maintaining the nighttime dose. She reports that she has had 3 ER visits for syncopal episodes and vague symptoms. One of these episodes appears to be in the setting of poor hydration, and her taking on more activity around the house than she typically does, leading to 3 presyncopal episodes in the day.  We agreed to maintain the current regimen of Effexor, and taper clonazepam further. She will follow-up with this writer in approximately 4-6 weeks, or sooner if needed.  Visit Diagnosis:    ICD-9-CM ICD-10-CM   1. Moderate episode of recurrent major depressive disorder (HCC) 296.32 F33.1     Past Psychiatric History: See intake H&P for full details. Reviewed, with no updates at this time.   Past Medical History:  Past Medical History:  Diagnosis Date  . Anxiety   . Bronchitis    uses inhaler if needed for bronchitis, lasted used -2014  . Cataract    bilateral  . Chronic headaches    migraines - in past, uses Phenergan for nausea   . Diabetes mellitus without complication ([place])    taken off Metformin since 04/2014, HgbA1C - normal, will follow up with PCP- Dr. Deborra Medina, 07/2014  . GERD (gastroesophageal reflux disease)   . H/O exercise stress test 2011   done at [facility]- told that it was WNL,  done due to pt. having panic attacks   . History of blood transfusion 06/26/66   at birth in [place], [place], unsure number of units  . Poor dentition    very poor oral health   . SVD (spontaneous vaginal delivery)    x 3  . Tobacco abuse     Past Surgical History:  Procedure Laterality Date  . ABDOMINAL HYSTERECTOMY    . APPENDECTOMY  1988  . CARDIAC CATHETERIZATION N/A 08/17/2016   Procedure: Left Heart Cath and Coronary Angiography;  Surgeon: Burnell Blanks, MD;  Location: [facility];  Service: Cardiovascular;  Laterality: N/A;  . CHOLECYSTECTOMY N/A 06/07/2014   Procedure: LAPAROSCOPIC CHOLECYSTECTOMY;  Surgeon: Ralene Ok, MD;  Location: [facility];  Service: General;  Laterality: N/A;  . CHOLECYSTECTOMY  June 07 2014  . COLONOSCOPY  02/16/2014   normal   . ESOPHAGOGASTRODUODENOSCOPY  12/28/2013  . KNEE ARTHROSCOPY  1995   left  . LAPAROSCOPIC ASSISTED VAGINAL HYSTERECTOMY N/A 04/15/2014   Procedure: LAPAROSCOPIC ASSISTED VAGINAL HYSTERECTOMY;  Surgeon: Marylynn Pearson, MD;  Location: [facility];  Service: Gynecology;  Laterality: N/A;  . LAPAROSCOPIC BILATERAL SALPINGO OOPHERECTOMY Bilateral 04/15/2014   Procedure: LAPAROSCOPIC BILATERAL SALPINGO OOPHORECTOMY;  Surgeon: Marylynn Pearson, MD;  Location: [facility];  Service: Gynecology;  Laterality: Bilateral;  . TUBAL LIGATION      Family Psychiatric History: See intake H&P for full details. Reviewed, with no updates at this time.  Family History:  Family History  Problem Relation Age of Onset  . Diabetes Mother   . Hyperlipidemia Mother   . Hypertension Mother   . Anxiety disorder Mother   . Depression Mother   . Drug abuse Mother   . Prostate cancer Father   . Alcohol abuse Father   . Lung cancer Maternal Grandfather        smoked  . Emphysema Maternal Grandfather        smoked  . COPD Maternal Grandmother        never smoked   . Dementia Maternal Grandmother   . Thyroid cancer Paternal Aunt   . Colon cancer  Neg Hx   . Rectal cancer Neg Hx   . Stomach cancer Neg Hx   . Migraines Neg Hx     Social History:  Social History   Social History  . Marital status: Married    Spouse name: Alvester Chou  . Number of children: 6  . Years of education: 38   Occupational History  .       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 Smoking status: Current Every Day Smoker    Packs/day: 1.00    Years: 35.00    Types: Cigarettes, E-cigarettes  . Smokeless tobacco: Never Used     Comment: 04/02/17 cigarettes 1.5 PPD  . Alcohol use No  . Drug use: No  . Sexual activity: Yes    Partners: Male    Birth control/ protection: Surgical   Other Topics Concern  . Not on file   Social History Narrative   5 living children, one child is a crack cocaine addict who often breaks into their house to steal money. One child died of cerebral palsy.   Caffeine use: Dr Malachi Bonds (3 per day)   2 cups coffee per day    Allergies:  Allergies  Allergen Reactions  . Penicillins Anaphylaxis    Throat swells, Has patient had a PCN reaction causing immediate rash, facial/tongue/throat swelling, SOB or lightheadedness with hypotension: Yes Has patient had a PCN reaction causing severe rash involving mucus membranes or skin necrosis: No Has patient had a PCN reaction that required hospitalization No Has patient had a PCN reaction occurring within the last 10 years: No If all of the above answers are "NO", then may proceed with Cephalosporin use.   . Topamax [Topiramate] Other (See Comments)    Hands and feet to go numb    Metabolic Disorder Labs: Lab Results  Component Value Date   HGBA1C 6.4 12/26/2016   No results found for: PROLACTIN Lab Results  Component Value Date   CHOL 166 12/19/2016   TRIG 116.0 12/19/2016   HDL 38.80 (L) 12/19/2016   CHOLHDL 4 12/19/2016   VLDL 23.2 12/19/2016   LDLCALC 104 (H) 12/19/2016   LDLCALC 125 (H) 10/10/2015     Current Medications: Current Outpatient  Prescriptions  Medication Sig Dispense Refill  . amitriptyline (ELAVIL) 50 MG tablet TAKE ONE AND ONE-HALF TABLET BY MOUTH AT DINNER TIME 45 tablet 3  . aspirin EC 81 MG tablet Take 81 mg by mouth daily.    . clonazePAM (KLONOPIN) 1 MG tablet Take 1 tablet (1 mg total) by mouth 3 (three) times daily as needed. for anxiety 90 tablet 0  . dicyclomine (BENTYL) 10 MG capsule TAKE ONE CAPSULE BY MOUTH TWICE DAILY AS NEEDED FOR  CRAMPING  AND  ABDOMINAL  PAIN 180 capsule 1  . docusate  sodium (COLACE) 100 MG capsule Take 100 mg by mouth at bedtime.    . famotidine (PEPCID) 20 MG tablet One at bedtime (Patient taking differently: Take 20 mg by mouth at bedtime. One at bedtime) 30 tablet 11  . furosemide (LASIX) 40 MG tablet Take 1 tablet (40 mg total) by mouth daily. (Patient taking differently: Take 40 mg by mouth at bedtime. ) 30 tablet 6  . metFORMIN (GLUCOPHAGE) 500 MG tablet Take 1 tablet (500 mg total) by mouth daily with breakfast. 30 tablet 3  . omeprazole (PRILOSEC) 20 MG capsule Take 1 capsule (20 mg total) by mouth 2 (two) times daily. 180 capsule 1  . promethazine (PHENERGAN) 25 MG tablet Take 1 tablet (25 mg total) by mouth every 6 (six) hours as needed for nausea or vomiting. 30 tablet 0  . rizatriptan (MAXALT-MLT) 10 MG disintegrating tablet Take 1 tablet (10 mg total) by mouth as needed for migraine. May repeat in 2 hours if needed. Do not take more than 3 tablets in a week. 10 tablet 3  . traZODone (DESYREL) 150 MG tablet Take 1 tablet (150 mg total) by mouth at bedtime. 90 tablet 0  . venlafaxine XR (EFFEXOR XR) 37.5 MG 24 hr capsule Take 1 capsule (37.5 mg total) by mouth daily. Increase to 2 capsules in 1 week 90 capsule 1  . VENTOLIN HFA 108 (90 Base) MCG/ACT inhaler Inhale 2 puffs into the lungs daily as needed for shortness of breath.  0   No current facility-administered medications for this visit.     Neurologic: Headache: Yes Seizure: Negative Paresthesias:  Negative  Musculoskeletal: Strength & Muscle Tone: within normal limits Gait & Station: unsteady Patient leans: N/A  Psychiatric Specialty Exam: ROS  Last menstrual period 02/12/2014.There is no height or weight on file to calculate BMI.  General Appearance: Casual and Well Groomed  Eye Contact:  Good  Speech:  Clear and Coherent  Volume:  Normal  Mood:  Dysphoric  Affect:  Congruent  Thought Process:  Goal Directed  Orientation:  Full (Time, Place, and Person)  Thought Content: Logical and Rumination   Suicidal Thoughts:  No  Homicidal Thoughts:  No  Memory:  Immediate;   Good  Judgement:  Fair  Insight:  Fair and Present  Psychomotor Activity:  Normal  Concentration:  Concentration: Good  Recall:  Good  Fund of Knowledge: Good  Language: Good  Akathisia:  Negative  Handed:  Right  AIMS (if indicated):  0  Assets:  Communication Skills Desire for Improvement Housing Leisure Time Social Support Transportation  ADL's:  Intact  Cognition: WNL  Sleep:  7-8 hours    Treatment Plan Summary: SOPHINA MITTEN is a 51 year old female with symptoms of conversion disorder and PTSD. She has a passive and generally avoidant method of interpersonal interaction, and is working on advocating for herself, and learning not to hold in all of her emotions and anxiety. She is doing a nice job in learning to communicate her feelings with her family, and let people know when she is feeling invalidated. Therapy will be the most important part of her treatment, but I'm hopeful with some pharmacologic interventions, it will be easier for her to use the coping and communication strategies learned in therapy.  1. Moderate episode of recurrent major depressive disorder (HCC)     Continue to taper the morning clonazepam to 0.25 mg Continue clonazepam 1 mg at bedtime Continue Effexor XR 75 mg daily Ongoing therapy and psychoeducation Follow-up in  6 weeks  Aundra Dubin, MD 05/16/2017, 4:14  PM

## 2017-05-16 NOTE — Telephone Encounter (Signed)
I scheduled her botox follow up.. I hope it is okay.Ashley [name]

## 2017-05-16 NOTE — Progress Notes (Signed)

## 2017-05-16 NOTE — Progress Notes (Signed)
Botox 200 units/vial x 1 from [facility] [id number] Lot [id number] Exp 12 2020  Diluted in 4 ml of Bacteriostatic 0.9% NaCl NDC 8485-9276-39 Lot [id number]

## 2017-05-21 NOTE — Telephone Encounter (Signed)
Noted, it is correct.

## 2017-05-23 ENCOUNTER — Observation Stay (HOSPITAL_COMMUNITY)
Admission: EM | Admit: 2017-05-23 | Discharge: 2017-05-24 | Disposition: A | Discharge status: Home / Self Care | Payer: BLUE CROSS/BLUE SHIELD | Attending: Internal Medicine | Admitting: Internal Medicine

## 2017-05-23 ENCOUNTER — Observation Stay (HOSPITAL_COMMUNITY): Payer: BLUE CROSS/BLUE SHIELD

## 2017-05-23 ENCOUNTER — Encounter (HOSPITAL_COMMUNITY): Payer: Self-pay

## 2017-05-23 ENCOUNTER — Telehealth: Payer: Self-pay | Admitting: Family Medicine

## 2017-05-23 ENCOUNTER — Emergency Department (HOSPITAL_COMMUNITY): Payer: BLUE CROSS/BLUE SHIELD

## 2017-05-23 ENCOUNTER — Ambulatory Visit (HOSPITAL_COMMUNITY): Payer: Self-pay | Admitting: Licensed Clinical Social Worker

## 2017-05-23 DIAGNOSIS — F4323 Adjustment disorder with mixed anxiety and depressed mood: Secondary | ICD-10-CM | POA: Diagnosis not present

## 2017-05-23 DIAGNOSIS — R531 Weakness: Secondary | ICD-10-CM

## 2017-05-23 DIAGNOSIS — Z720 Tobacco use: Secondary | ICD-10-CM | POA: Diagnosis present

## 2017-05-23 DIAGNOSIS — F17210 Nicotine dependence, cigarettes, uncomplicated: Secondary | ICD-10-CM | POA: Insufficient documentation

## 2017-05-23 DIAGNOSIS — E119 Type 2 diabetes mellitus without complications: Secondary | ICD-10-CM | POA: Diagnosis not present

## 2017-05-23 DIAGNOSIS — Z7984 Long term (current) use of oral hypoglycemic drugs: Secondary | ICD-10-CM | POA: Insufficient documentation

## 2017-05-23 DIAGNOSIS — E118 Type 2 diabetes mellitus with unspecified complications: Secondary | ICD-10-CM | POA: Diagnosis not present

## 2017-05-23 DIAGNOSIS — R42 Dizziness and giddiness: Secondary | ICD-10-CM | POA: Insufficient documentation

## 2017-05-23 DIAGNOSIS — Z79899 Other long term (current) drug therapy: Secondary | ICD-10-CM | POA: Diagnosis not present

## 2017-05-23 DIAGNOSIS — M542 Cervicalgia: Secondary | ICD-10-CM | POA: Diagnosis not present

## 2017-05-23 DIAGNOSIS — E876 Hypokalemia: Secondary | ICD-10-CM | POA: Diagnosis present

## 2017-05-23 DIAGNOSIS — J42 Unspecified chronic bronchitis: Secondary | ICD-10-CM | POA: Diagnosis present

## 2017-05-23 DIAGNOSIS — F411 Generalized anxiety disorder: Secondary | ICD-10-CM | POA: Diagnosis present

## 2017-05-23 DIAGNOSIS — E785 Hyperlipidemia, unspecified: Secondary | ICD-10-CM | POA: Diagnosis present

## 2017-05-23 DIAGNOSIS — R0789 Other chest pain: Secondary | ICD-10-CM

## 2017-05-23 DIAGNOSIS — F450 Somatization disorder: Secondary | ICD-10-CM | POA: Diagnosis not present

## 2017-05-23 DIAGNOSIS — R55 Syncope and collapse: Secondary | ICD-10-CM | POA: Diagnosis present

## 2017-05-23 DIAGNOSIS — Z7982 Long term (current) use of aspirin: Secondary | ICD-10-CM | POA: Diagnosis not present

## 2017-05-23 DIAGNOSIS — R079 Chest pain, unspecified: Secondary | ICD-10-CM | POA: Diagnosis not present

## 2017-05-23 DIAGNOSIS — R52 Pain, unspecified: Secondary | ICD-10-CM

## 2017-05-23 DIAGNOSIS — N179 Acute kidney failure, unspecified: Secondary | ICD-10-CM | POA: Diagnosis not present

## 2017-05-23 LAB — URINALYSIS, ROUTINE W REFLEX MICROSCOPIC
Bilirubin Urine: NEGATIVE
Glucose, UA: NEGATIVE mg/dL
Hgb urine dipstick: NEGATIVE
Ketones, ur: NEGATIVE mg/dL
Leukocytes, UA: NEGATIVE
Nitrite: NEGATIVE
Protein, ur: NEGATIVE mg/dL
Specific Gravity, Urine: 1.005 (ref 1.005–1.030)
pH: 6 (ref 5.0–8.0)

## 2017-05-23 LAB — BASIC METABOLIC PANEL
ANION GAP: 14 (ref 5–15)
BUN: 11 mg/dL (ref 6–20)
CHLORIDE: 95 mmol/L — AB (ref 101–111)
CO2: 26 mmol/L (ref 22–32)
Calcium: 9.3 mg/dL (ref 8.9–10.3)
Creatinine, Ser: 1.21 mg/dL — ABNORMAL HIGH (ref 0.44–1.00)
GFR calc Af Amer: 59 mL/min — ABNORMAL LOW (ref >60)
GFR calc non Af Amer: 51 mL/min — ABNORMAL LOW (ref >60)
Glucose, Bld: 181 mg/dL — ABNORMAL HIGH (ref 65–99)
POTASSIUM: 2.7 mmol/L — AB (ref 3.5–5.1)
SODIUM: 135 mmol/L (ref 135–145)

## 2017-05-23 LAB — LIPID PANEL
CHOL/HDL RATIO: 3.9 ratio
Cholesterol: 208 mg/dL — ABNORMAL HIGH (ref 0–200)
HDL: 53 mg/dL (ref >40)
LDL CALC: 133 mg/dL — AB (ref 0–99)
Triglycerides: 112 mg/dL (ref <150)
VLDL: 22 mg/dL (ref 0–40)

## 2017-05-23 LAB — CBG MONITORING, ED: Glucose-Capillary: 204 mg/dL — ABNORMAL HIGH (ref 65–99)

## 2017-05-23 LAB — RAPID URINE DRUG SCREEN, HOSP PERFORMED
Amphetamines: NOT DETECTED
BARBITURATES: NOT DETECTED
Benzodiazepines: NOT DETECTED
Cocaine: NOT DETECTED
Opiates: NOT DETECTED
TETRAHYDROCANNABINOL: NOT DETECTED

## 2017-05-23 LAB — HEPATIC FUNCTION PANEL
ALT: 22 U/L (ref 14–54)
AST: 26 U/L (ref 15–41)
Albumin: 3.9 g/dL (ref 3.5–5.0)
Alkaline Phosphatase: 117 U/L (ref 38–126)
Bilirubin, Direct: <0.1 mg/dL — ABNORMAL LOW (ref 0.1–0.5)
Total Bilirubin: 0.7 mg/dL (ref 0.3–1.2)
Total Protein: 7.3 g/dL (ref 6.5–8.1)

## 2017-05-23 LAB — MAGNESIUM: MAGNESIUM: 1.8 mg/dL (ref 1.7–2.4)

## 2017-05-23 LAB — GLUCOSE, CAPILLARY
Glucose-Capillary: 104 mg/dL — ABNORMAL HIGH (ref 65–99)
Glucose-Capillary: 178 mg/dL — ABNORMAL HIGH (ref 65–99)

## 2017-05-23 LAB — CBC
HEMATOCRIT: 44.7 % (ref 36.0–46.0)
HEMOGLOBIN: 15.6 g/dL — AB (ref 12.0–15.0)
MCH: 31.6 pg (ref 26.0–34.0)
MCHC: 34.9 g/dL (ref 30.0–36.0)
MCV: 90.7 fL (ref 78.0–100.0)
Platelets: 343 10*3/uL (ref 150–400)
RBC: 4.93 MIL/uL (ref 3.87–5.11)
RDW: 13.4 % (ref 11.5–15.5)
WBC: 8.7 10*3/uL (ref 4.0–10.5)

## 2017-05-23 LAB — TROPONIN I: Troponin I: <0.03 ng/mL (ref <0.03)

## 2017-05-23 LAB — I-STAT TROPONIN, ED: Troponin i, poc: 0 ng/mL (ref 0.00–0.08)

## 2017-05-23 LAB — D-DIMER, QUANTITATIVE: D-Dimer, Quant: <0.27 ug/mL-FEU (ref 0.00–0.50)

## 2017-05-23 LAB — PHOSPHORUS: Phosphorus: 4.6 mg/dL (ref 2.5–4.6)

## 2017-05-23 MED ORDER — INSULIN ASPART 100 UNIT/ML ~~LOC~~ SOLN: 0.0000 [IU] | Freq: Every day | SUBCUTANEOUS | Status: DC

## 2017-05-23 MED ORDER — DOCUSATE SODIUM 100 MG PO CAPS
100.0000 mg | ORAL_CAPSULE | Freq: Every day | ORAL | Status: DC
  Administered 2017-05-23: 100 mg via ORAL
  Filled 2017-05-23: qty 1

## 2017-05-23 MED ORDER — POTASSIUM CHLORIDE CRYS ER 20 MEQ PO TBCR
40.0000 meq | EXTENDED_RELEASE_TABLET | Freq: Once | ORAL | Status: AC
  Administered 2017-05-23: 40 meq via ORAL
  Filled 2017-05-23: qty 2

## 2017-05-23 MED ORDER — POTASSIUM CHLORIDE 10 MEQ/100ML IV SOLN
10.0000 meq | Freq: Once | INTRAVENOUS | Status: AC
  Administered 2017-05-23: 10 meq via INTRAVENOUS
  Filled 2017-05-23: qty 100

## 2017-05-23 MED ORDER — FUROSEMIDE 40 MG PO TABS
40.0000 mg | ORAL_TABLET | Freq: Every day | ORAL | Status: DC
  Administered 2017-05-24: 40 mg via ORAL
  Filled 2017-05-23: qty 1

## 2017-05-23 MED ORDER — NITROGLYCERIN 0.4 MG SL SUBL
0.4000 mg | SUBLINGUAL_TABLET | SUBLINGUAL | Status: DC | PRN
  Administered 2017-05-23: 0.4 mg via SUBLINGUAL
  Filled 2017-05-23: qty 1

## 2017-05-23 MED ORDER — CLONAZEPAM 0.5 MG PO TABS
1.0000 mg | ORAL_TABLET | Freq: Three times a day (TID) | ORAL | Status: DC | PRN
  Administered 2017-05-24: 1 mg via ORAL
  Filled 2017-05-23: qty 2

## 2017-05-23 MED ORDER — ALBUTEROL SULFATE (2.5 MG/3ML) 0.083% IN NEBU: 3.0000 mL | INHALATION_SOLUTION | Freq: Every day | RESPIRATORY_TRACT | Status: DC | PRN

## 2017-05-23 MED ORDER — TRAZODONE HCL 50 MG PO TABS
150.0000 mg | ORAL_TABLET | Freq: Every day | ORAL | Status: DC
  Administered 2017-05-23: 150 mg via ORAL
  Filled 2017-05-23: qty 1

## 2017-05-23 MED ORDER — PANTOPRAZOLE SODIUM 40 MG PO TBEC
40.0000 mg | DELAYED_RELEASE_TABLET | Freq: Every day | ORAL | Status: DC
  Administered 2017-05-24: 40 mg via ORAL
  Filled 2017-05-23: qty 1

## 2017-05-23 MED ORDER — FAMOTIDINE 20 MG PO TABS
20.0000 mg | ORAL_TABLET | Freq: Every day | ORAL | Status: DC
  Administered 2017-05-23: 20 mg via ORAL
  Filled 2017-05-23: qty 1

## 2017-05-23 MED ORDER — MORPHINE SULFATE (PF) 4 MG/ML IV SOLN
4.0000 mg | Freq: Once | INTRAVENOUS | Status: AC
  Administered 2017-05-23: 4 mg via INTRAVENOUS
  Filled 2017-05-23: qty 1

## 2017-05-23 MED ORDER — GI COCKTAIL ~~LOC~~
30.0000 mL | Freq: Four times a day (QID) | ORAL | Status: DC | PRN
  Administered 2017-05-23: 30 mL via ORAL
  Filled 2017-05-23: qty 30

## 2017-05-23 MED ORDER — KETOROLAC TROMETHAMINE 15 MG/ML IJ SOLN
15.0000 mg | Freq: Four times a day (QID) | INTRAMUSCULAR | Status: DC | PRN
  Administered 2017-05-23: 15 mg via INTRAVENOUS
  Filled 2017-05-23: qty 1

## 2017-05-23 MED ORDER — SODIUM CHLORIDE 0.9 % IV SOLN
INTRAVENOUS | Status: AC
  Administered 2017-05-23 (×2): via INTRAVENOUS

## 2017-05-23 MED ORDER — VENLAFAXINE HCL ER 37.5 MG PO CP24
37.5000 mg | ORAL_CAPSULE | Freq: Every day | ORAL | Status: DC
  Administered 2017-05-24: 37.5 mg via ORAL
  Filled 2017-05-23: qty 1

## 2017-05-23 MED ORDER — INSULIN ASPART 100 UNIT/ML ~~LOC~~ SOLN
0.0000 [IU] | Freq: Three times a day (TID) | SUBCUTANEOUS | Status: DC
  Administered 2017-05-24: 1 [IU] via SUBCUTANEOUS

## 2017-05-23 MED ORDER — AMITRIPTYLINE HCL 25 MG PO TABS: 75.0000 mg | ORAL_TABLET | Freq: Every evening | ORAL | Status: DC | PRN

## 2017-05-23 MED ORDER — ASPIRIN EC 325 MG PO TBEC
325.0000 mg | DELAYED_RELEASE_TABLET | Freq: Every day | ORAL | Status: DC
  Administered 2017-05-23 – 2017-05-24 (×2): 325 mg via ORAL
  Filled 2017-05-23 (×2): qty 1

## 2017-05-23 MED ORDER — MORPHINE SULFATE (PF) 4 MG/ML IV SOLN: 2.0000 mg | INTRAVENOUS | Status: DC | PRN

## 2017-05-23 MED ORDER — ACETAMINOPHEN 325 MG PO TABS: 650.0000 mg | ORAL_TABLET | ORAL | Status: DC | PRN

## 2017-05-23 MED ORDER — ONDANSETRON HCL 4 MG/2ML IJ SOLN: 4.0000 mg | Freq: Four times a day (QID) | INTRAMUSCULAR | Status: DC | PRN

## 2017-05-23 MED ORDER — NITROGLYCERIN 2 % TD OINT
1.0000 [in_us] | TOPICAL_OINTMENT | Freq: Once | TRANSDERMAL | Status: AC
  Administered 2017-05-23: 1 [in_us] via TOPICAL
  Filled 2017-05-23: qty 1

## 2017-05-23 MED ORDER — SODIUM CHLORIDE 0.9 % IV BOLUS (SEPSIS)
500.0000 mL | Freq: Once | INTRAVENOUS | Status: AC
  Administered 2017-05-23: 500 mL via INTRAVENOUS

## 2017-05-23 MED ORDER — ENOXAPARIN SODIUM 40 MG/0.4ML ~~LOC~~ SOLN
40.0000 mg | SUBCUTANEOUS | Status: DC
  Administered 2017-05-23: 40 mg via SUBCUTANEOUS
  Filled 2017-05-23: qty 0.4

## 2017-05-23 NOTE — ED Notes (Signed)
Attempted report x1. 

## 2017-05-23 NOTE — ED Notes (Signed)
Pt transported to XR.  

## 2017-05-23 NOTE — ED Triage Notes (Signed)
Pt states she has had neck pain X3 days. She also reports feeling that she will pass out after walking. Pt alert and oriented. Pt denies chest pain but has reported palpitations.

## 2017-05-23 NOTE — ED Notes (Signed)
Dr. Marily Memos at the bedside.

## 2017-05-23 NOTE — H&P (Signed)
History and Physical    Ashley [name] DUK:[id number] DOB: 1966-07-09 DOA: 05/23/2017  PCP: Lucille Passy, MD Patient coming from: home  Chief Complaint: Chest pain. Generalized weakness. Near-syncope  HPI: Ashley [name] is a 51 y.o. female with medical history significant for tobacco use, GERD, diabetes, chronic migraines, anxiety, adjustment disorder with mixed features, anxiety state, somatotype sensation disorder presents to  emergency department with chief complaint intermittent chest pain dizziness generalized weakness. Patient being admitted for rule out, hypokalemia, acute kidney injury.   Information is obtained from the patient. She states that the last week or so she's experienced intermittent neck pain. She describes the pain as a "burning and aching". She denies any recent injury or fall. He denies any numbness or tingling of her upper extremities. She denies any weakness of her upper extremities. She denies headache but does endorse some dizziness with position change. Yesterday she developed chest pain with exertion. She describes the pain as a pressure located substernal area rated a 7 out of 10 and non-radiating. Associated symptoms include diaphoresis shortness breath nausea without vomiting. He denies abdominal pain back pain cough headache lower extremity edema orthopnea. She denies fever chills recent travel or sick contacts. She denies dysuria hematuria frequency or urgency. She denies diarrhea constipation melena bright red blood per rectum.  ED Course: In the emergency department she is afebrile hemodynamically stable mildly tachycardic mild tachypnea she is not hypoxic. She is provided with morphine and nitroglycerin. The time of my exam chest pain resolved  Review of Systems: As per HPI otherwise all other systems reviewed and are negative.   Ambulatory Status: Ambulates independently with a steady gait no recent falls  Past Medical History:  Diagnosis Date  . Anxiety   .  Bronchitis    uses inhaler if needed for bronchitis, lasted used -2014  . Cataract    bilateral  . Chronic headaches    migraines - in past, uses Phenergan for nausea   . Diabetes mellitus without complication ([place])    taken off Metformin since 04/2014, HgbA1C - normal, will follow up with PCP- Dr. Deborra Medina, 07/2014  . GERD (gastroesophageal reflux disease)   . H/O exercise stress test 2011   done at [facility]- told that it was WNL, done due to pt. having panic attacks   . History of blood transfusion 1966/11/[birth year]   at birth in [place], [place], unsure number of units  . Poor dentition    very poor oral health   . SVD (spontaneous vaginal delivery)    x 3  . Tobacco abuse     Past Surgical History:  Procedure Laterality Date  . ABDOMINAL HYSTERECTOMY    . APPENDECTOMY  1988  . CARDIAC CATHETERIZATION N/A 08/17/2016   Procedure: Left Heart Cath and Coronary Angiography;  Surgeon: Burnell Blanks, MD;  Location: [facility];  Service: Cardiovascular;  Laterality: N/A;  . CHOLECYSTECTOMY N/A 06/07/2014   Procedure: LAPAROSCOPIC CHOLECYSTECTOMY;  Surgeon: Ralene Ok, MD;  Location: [facility];  Service: General;  Laterality: N/A;  . CHOLECYSTECTOMY  June 07 2014  . COLONOSCOPY  02/16/2014   normal   . ESOPHAGOGASTRODUODENOSCOPY  12/28/2013  . KNEE ARTHROSCOPY  1995   left  . LAPAROSCOPIC ASSISTED VAGINAL HYSTERECTOMY N/A 04/15/2014   Procedure: LAPAROSCOPIC ASSISTED VAGINAL HYSTERECTOMY;  Surgeon: Marylynn Pearson, MD;  Location: [facility];  Service: Gynecology;  Laterality: N/A;  . LAPAROSCOPIC BILATERAL SALPINGO OOPHERECTOMY Bilateral 04/15/2014   Procedure: LAPAROSCOPIC BILATERAL SALPINGO OOPHORECTOMY;  Surgeon:  Marylynn Pearson, MD;  Location: [facility];  Service: Gynecology;  Laterality: Bilateral;  . TUBAL LIGATION      Social History   Social History  . Marital status: Married    Spouse name: Alvester Chou  . Number of children: 6  . Years of education: 61   Occupational History  .        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 Smoking status: Current Every Day Smoker    Packs/day: 1.00    Years: 35.00    Types: Cigarettes, E-cigarettes  . Smokeless tobacco: Never Used     Comment: 04/02/17 cigarettes 1.5 PPD  . Alcohol use No  . Drug use: No  . Sexual activity: Yes    Partners: Male    Birth control/ protection: Surgical   Other Topics Concern  . Not on file   Social History Narrative   5 living children, one child is a crack cocaine addict who often breaks into their house to steal money. One child died of cerebral palsy.   Caffeine use: Dr Malachi Bonds (3 per day)   2 cups coffee per day    Allergies  Allergen Reactions  . Penicillins Anaphylaxis    Throat swells, Has patient had a PCN reaction causing immediate rash, facial/tongue/throat swelling, SOB or lightheadedness with hypotension: Yes Has patient had a PCN reaction causing severe rash involving mucus membranes or skin necrosis: No Has patient had a PCN reaction that required hospitalization No Has patient had a PCN reaction occurring within the last 10 years: No If all of the above answers are "NO", then may proceed with Cephalosporin use.   . Topamax [Topiramate] Other (See Comments)    Hands and feet to go numb    Family History  Problem Relation Age of Onset  . Diabetes Mother   . Hyperlipidemia Mother   . Hypertension Mother   . Anxiety disorder Mother   . Depression Mother   . Drug abuse Mother   . Prostate cancer Father   . Alcohol abuse Father   . Lung cancer Maternal Grandfather        smoked  . Emphysema Maternal Grandfather        smoked  . COPD Maternal Grandmother        never smoked   . Dementia Maternal Grandmother   . Thyroid cancer Paternal Aunt   . Colon cancer Neg Hx   . Rectal cancer Neg Hx   . Stomach cancer Neg Hx   . Migraines Neg Hx     Prior to Admission medications   Medication Sig Start Date End Date Taking? Authorizing Provider    amitriptyline (ELAVIL) 50 MG tablet TAKE ONE AND ONE-HALF TABLET BY MOUTH AT [facility] TIME 05/13/17  Yes Lucille Passy, MD  aspirin EC 81 MG tablet Take 81 mg by mouth daily.   Yes [provider]  clonazePAM (KLONOPIN) 1 MG tablet Take 1 tablet (1 mg total) by mouth 3 (three) times daily as needed. for anxiety 05/13/17  Yes Lucille Passy, MD  dicyclomine (BENTYL) 10 MG capsule TAKE ONE CAPSULE BY MOUTH TWICE DAILY AS NEEDED FOR  CRAMPING  AND  ABDOMINAL  PAIN 10/05/16  Yes Lucille Passy, MD  docusate sodium (COLACE) 100 MG capsule Take 100 mg by mouth at bedtime.   Yes [provider]  famotidine (PEPCID) 20 MG tablet One at bedtime Patient taking differently: Take 20 mg by mouth at bedtime. One  at bedtime 08/29/16  Yes Tanda Rockers, MD  furosemide (LASIX) 40 MG tablet Take 1 tablet (40 mg total) by mouth daily. Patient taking differently: Take 40 mg by mouth at bedtime.  08/10/16 05/23/17 Yes Simmons, Brittainy M, PA-C  metFORMIN (GLUCOPHAGE) 500 MG tablet Take 1 tablet (500 mg total) by mouth daily with breakfast. 12/28/16  Yes Lucille Passy, MD  omeprazole (PRILOSEC) 20 MG capsule Take 1 capsule (20 mg total) by mouth 2 (two) times daily. 09/20/16  Yes Lucille Passy, MD  promethazine (PHENERGAN) 25 MG tablet Take 1 tablet (25 mg total) by mouth every 6 (six) hours as needed for nausea or vomiting. 12/26/16  Yes Lucille Passy, MD  rizatriptan (MAXALT-MLT) 10 MG disintegrating tablet Take 1 tablet (10 mg total) by mouth as needed for migraine. May repeat in 2 hours if needed. Do not take more than 3 tablets in a week. 03/14/16  Yes Pleas Koch, NP  traZODone (DESYREL) 150 MG tablet Take 1 tablet (150 mg total) by mouth at bedtime. 05/13/17  Yes Lucille Passy, MD  venlafaxine XR (EFFEXOR XR) 37.5 MG 24 hr capsule Take 1 capsule (37.5 mg total) by mouth daily. Increase to 2 capsules in 1 week 04/17/17 04/17/18 Yes Eksir, Richard Miu, MD  VENTOLIN HFA 108 [phone] Base) MCG/ACT inhaler  Inhale 2 puffs into the lungs daily as needed for shortness of breath. 01/02/17  Yes [provider]    Physical Exam: Vitals:   05/23/17 1530 05/23/17 1545 05/23/17 1600 05/23/17 1615  BP: 107/68 113/67 116/71 110/73  Pulse: 88 83 96 84  Resp: 17 14 19 15   Temp:      TempSrc:      SpO2: 97% 98% 98% 98%  Weight:      Height:         General:  Appears calm and comfortable, eating up in bed watching TV Eyes:  PERRL, EOMI, normal lids, iris ENT:  grossly normal hearing, lips & tongue, mucous membranes of her mouth pink somewhat dry oropharynx without erythema or exudate Neck:  no LAD, masses or thyromegaly Cardiovascular:  Tachycardia but regular, no m/r/g. No LE edema. Pedal pulses present and palpable Respiratory:  CTA bilaterally, no w/r/r. Normal respiratory effort. Abdomen:  soft, ntnd, has a bowel sounds throughout no guarding or rebounding Skin:  no rash or induration seen on limited exam Musculoskeletal:  grossly normal tone BUE/BLE, good ROM, no bony abnormality, neck without swelling or tenderness to range of motion Psychiatric:  grossly normal mood and affect, speech fluent and appropriate, AOx3 Neurologic:  CN 2-12 grossly intact, moves all extremities in coordinated fashion, sensation intact. Bilateral grip 5 out of 5 bilateral upper extremity sensation intact  Labs on Admission: I have personally reviewed following labs and imaging studies  CBC:  Recent Labs Lab 05/23/17 1322  WBC 8.7  HGB 15.6*  HCT 44.7  MCV 90.7  PLT 811   Basic Metabolic Panel:  Recent Labs Lab 05/23/17 1322  NA 135  K 2.7*  CL 95*  CO2 26  GLUCOSE 181*  BUN 11  CREATININE 1.21*  CALCIUM 9.3   GFR: Estimated Creatinine Clearance: 51.3 mL/min (A) (by C-G formula based on SCr of 1.21 mg/dL (H)). Liver Function Tests:  Recent Labs Lab 05/23/17 1354  AST 26  ALT 22  ALKPHOS 117  BILITOT 0.7  PROT 7.3  ALBUMIN 3.9   No results for input(s): LIPASE, AMYLASE in the  last 168 hours. No  results for input(s): AMMONIA in the last 168 hours. Coagulation Profile: No results for input(s): INR, PROTIME in the last 168 hours. Cardiac Enzymes: No results for input(s): CKTOTAL, CKMB, CKMBINDEX, TROPONINI in the last 168 hours. BNP (last 3 results)  Recent Labs  07/25/16 1315  PROBNP 12.0   HbA1C: No results for input(s): HGBA1C in the last 72 hours. CBG:  Recent Labs Lab 05/23/17 1321  GLUCAP 204*   Lipid Profile: No results for input(s): CHOL, HDL, LDLCALC, TRIG, CHOLHDL, LDLDIRECT in the last 72 hours. Thyroid Function Tests: No results for input(s): TSH, T4TOTAL, FREET4, T3FREE, THYROIDAB in the last 72 hours. Anemia Panel: No results for input(s): VITAMINB12, FOLATE, FERRITIN, TIBC, IRON, RETICCTPCT in the last 72 hours. Urine analysis:    Component Value Date/Time   COLORURINE STRAW (A) 04/25/2017 0411   APPEARANCEUR CLEAR 04/25/2017 0411   LABSPEC 1.008 04/25/2017 0411   PHURINE 6.0 04/25/2017 0411   GLUCOSEU NEGATIVE 04/25/2017 0411   HGBUR NEGATIVE 04/25/2017 0411   BILIRUBINUR NEGATIVE 04/25/2017 0411   BILIRUBINUR negative 10/29/2016 1254   KETONESUR NEGATIVE 04/25/2017 0411   PROTEINUR NEGATIVE 04/25/2017 0411   UROBILINOGEN 0.2 10/29/2016 1254   UROBILINOGEN 0.2 04/30/2014 1242   NITRITE NEGATIVE 04/25/2017 0411   LEUKOCYTESUR NEGATIVE 04/25/2017 0411    Creatinine Clearance: Estimated Creatinine Clearance: 51.3 mL/min (A) (by C-G formula based on SCr of 1.21 mg/dL (H)).  Sepsis Labs: @LABRCNTIP (procalcitonin:4,lacticidven:4) )No results found for this or any previous visit (from the past 240 hour(s)).   Radiological Exams on Admission: Dg Chest 2 View  Result Date: 05/23/2017 CLINICAL DATA:  51 year old presenting with three-day history of chest pain and intermittent cough. Current smoker. EXAM: CHEST  2 VIEW COMPARISON:  CTA chest 07/26/2016. Chest x-rays 11/09/2015, 04/25/2015, 07/02/2010. FINDINGS: Cardiomediastinal  silhouette unremarkable, unchanged. Linear atelectasis or scarring in the right middle lobe and lingula. Lungs otherwise clear. Pulmonary vascularity normal. No pleural effusions. No pneumothorax. Visualized bony thorax intact. IMPRESSION: Linear atelectasis or scarring in the right middle lobe and lingula. No acute cardiopulmonary disease otherwise. Electronically Signed   By: Evangeline Dakin M.D.   On: 05/23/2017 14:47    EKG: Independently reviewed. Sinus rhythm Right atrial enlargement Minimal ST depression, anterolateral leads Prolonged QT  Assessment/Plan Principal Problem:   Chest pain Active Problems:   Anxiety state   ADJUSTMENT DISORDER WITH MIXED FEATURES   HLD (hyperlipidemia)   Chronic bronchitis (HCC)   Diabetes (HCC)   Tobacco abuse   Somatization disorder   Generalized weakness   Hypokalemia   Neck pain   Acute kidney injury ([place])   #1. Chest pain. Symptoms atypical and somewhat peculiar. Heart score 3. ekg with sinus rhythm right atrial enlargement minimal ST depression to long QT. Initial troponin negative. She has a sodium level of 2.7. She is provided with nitroglycerin morphine and is pain-free on admission. Of note patient underwent cardiac cath 10 months ago revealing no angiographic evidence of CAD normal LV systolic function and normal LV filling pressures. Recommendations at that time no further ischemic workup -Admit -Cycle troponin -Serial EKG -Lipid panel -Monitor oxygen saturation level -GI cocktail  #2. Neck pain. Etiology unclear. No history of degenerative disc disease. Denies trauma or fall. No numbness or tingling of upper extremities. No weakness of the upper extremities -Plain x-rays in flexion and extension -Anti-inflammatory -physical therapy  3. Generalized weakness/dizziness/near syncope. Likely related to above. Recent CT of the head unremarkable. EKG as noted above. Initial troponin negative. No indication of infectious process.  Potassium  level 2.7 otherwise no indication of metabolic derangement -Gentle IV fluids -Orthostatic vital signs -Follow urinalysis -Obtain urine drug screen -See above  #4. Acute kidney injury. Likely related decreased oral intake. Creatinine 1.21 on admission. Baseline appears to be 0.70. -Gentle IV fluids -Hold nephrotoxins -Monitor urine output -Recheck in the morning  #5. Hypokalemia. Potassium level 2.7. She was given 10 mEq intravenously and 40 mEq orally in the emergency department. EKG as noted above -Monitor on telemetry -Replete 1 more dose tonight -Obtain magnesium level -Recheck in the morning  6. Diabetes. Diet controlled. Serum glucose 181 on admission. -Obtain a hemoglobin A1c -Hold oral agents for now -Sliding scale insulin for optimal control  #7. Somatic disorder. Currently under the care behavioral health. Of note patient was admitted 3 weeks ago with complaints bilateral leg numbness. Evaluated by neurology at that time who opined examination nonphysiologic and may be conversion versus malingering versus factitious disorder. This admission case discussed with Dr Louretta Shorten psychiatry and he recommended that the patient follow-up with her own psychiatrist and no need for inpatient psych consultation. Currently she denies suicidal or homicidal ideation. -Continue home meds -Outpatient follow-up  #8. Tobacco use. -Cessation counseling   DVT prophylaxis: lovenox  Code Status: full  Family Communication: none present  Disposition Plan: home tomorrow  Consults called: none  Admission status: obs    Dyanne Carrel M MD [facility]  If 7PM-7AM, please contact night-coverage [email or web address] Password [facility]  05/23/2017, 4:41 PM

## 2017-05-23 NOTE — ED Notes (Signed)
Attempted report x 2 

## 2017-05-23 NOTE — ED Notes (Signed)
Pt. returned from XR. 

## 2017-05-23 NOTE — ED Notes (Addendum)
Pt in xray. Will attempt to get urine sample when pt returns.

## 2017-05-23 NOTE — ED Provider Notes (Signed)
[facility] DEPT Provider Note   CSN: [id number] Arrival date & time: 05/23/17  1240     History   Chief Complaint Chief Complaint  Patient presents with  . Near Syncope    HPI Ashley [name] is a 51 y.o. female.  Patient has had some chest pain along with pain in the back of her neck with some sweating and dizziness today.   The history is provided by the patient. No language interpreter was used.  Chest Pain   This is a new problem. The current episode started 12 to 24 hours ago. The problem occurs rarely. The problem has been gradually improving. The pain is associated with exertion. The pain is present in the substernal region. The pain is at a severity of 7/10. The pain is moderate. The quality of the pain is described as dull. The pain does not radiate. Pertinent negatives include no abdominal pain, no back pain, no cough and no headaches.  Pertinent negatives for past medical history include no seizures.    Past Medical History:  Diagnosis Date  . Anxiety   . Bronchitis    uses inhaler if needed for bronchitis, lasted used -2014  . Cataract    bilateral  . Chronic headaches    migraines - in past, uses Phenergan for nausea   . Diabetes mellitus without complication ([place])    taken off Metformin since 04/2014, HgbA1C - normal, will follow up with PCP- Dr. Deborra Medina, 07/2014  . GERD (gastroesophageal reflux disease)   . H/O exercise stress test 2011   done at [facility]- told that it was WNL, done due to pt. having panic attacks   . History of blood transfusion 1966/10/17   at birth in [place], [place], unsure number of units  . Poor dentition    very poor oral health   . SVD (spontaneous vaginal delivery)    x 3  . Tobacco abuse     Patient Active Problem List   Diagnosis Date Noted  . Hypokalemia 05/23/2017  . Neck pain 05/23/2017  . Moderate episode of recurrent major depressive disorder ([place]) 05/16/2017  . Generalized weakness 04/25/2017  . Bilateral leg numbness  04/25/2017  . Diabetes mellitus with complication ([place])   . Right arm weakness   . 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 Paresthesia 03/04/2017  . Hospital discharge follow-up 02/19/2017  . Stroke-like symptom 02/19/2017  . Tobacco abuse 01/08/2017  . Diabetes ([place]) 12/26/2016  . Chronic bronchitis ([place]) 10/20/2015  . HLD (hyperlipidemia) 04/12/2015  . Migraine with aura, intractable 08/12/2014  . Chest pain 07/23/2014  . S/P hysterectomy with oophorectomy 04/15/2014  . Hyperglycemia 01/26/2013  . Migraine 05/30/2010  . Anxiety state 09/30/2009  . INSOMNIA, CHRONIC 09/30/2009  . ADJUSTMENT DISORDER WITH MIXED FEATURES 09/30/2009  . ELEVATED BLOOD PRESSURE WITHOUT DIAGNOSIS OF HYPERTENSION 09/30/2009    Past Surgical History:  Procedure Laterality Date  . ABDOMINAL HYSTERECTOMY    . APPENDECTOMY  1988  . CARDIAC CATHETERIZATION N/A 08/17/2016   Procedure: Left Heart Cath and Coronary Angiography;  Surgeon: Burnell Blanks, MD;  Location: [facility];  Service: Cardiovascular;  Laterality: N/A;  . CHOLECYSTECTOMY N/A 06/07/2014   Procedure: LAPAROSCOPIC CHOLECYSTECTOMY;  Surgeon: Ralene Ok, MD;  Location: [facility];  Service: General;  Laterality: N/A;  . CHOLECYSTECTOMY  June 07 2014  . COLONOSCOPY  02/16/2014   normal   . ESOPHAGOGASTRODUODENOSCOPY  12/28/2013  . KNEE ARTHROSCOPY  1995   left  . LAPAROSCOPIC ASSISTED VAGINAL HYSTERECTOMY  N/A 04/15/2014   Procedure: LAPAROSCOPIC ASSISTED VAGINAL HYSTERECTOMY;  Surgeon: Marylynn Pearson, MD;  Location: [facility];  Service: Gynecology;  Laterality: N/A;  . LAPAROSCOPIC BILATERAL SALPINGO OOPHERECTOMY Bilateral 04/15/2014   Procedure: LAPAROSCOPIC BILATERAL SALPINGO OOPHORECTOMY;  Surgeon: Marylynn Pearson, MD;  Location: [facility];  Service: Gynecology;  Laterality: Bilateral;  . TUBAL LIGATION      OB History    No data available       Home Medications    Prior to Admission medications   Medication Sig Start Date  End Date Taking? Authorizing Provider  amitriptyline (ELAVIL) 50 MG tablet TAKE ONE AND ONE-HALF TABLET BY MOUTH AT [facility] TIME 05/13/17  Yes Lucille Passy, MD  aspirin EC 81 MG tablet Take 81 mg by mouth daily.   Yes [provider]  clonazePAM (KLONOPIN) 1 MG tablet Take 1 tablet (1 mg total) by mouth 3 (three) times daily as needed. for anxiety 05/13/17  Yes Lucille Passy, MD  dicyclomine (BENTYL) 10 MG capsule TAKE ONE CAPSULE BY MOUTH TWICE DAILY AS NEEDED FOR  CRAMPING  AND  ABDOMINAL  PAIN 10/05/16  Yes Lucille Passy, MD  docusate sodium (COLACE) 100 MG capsule Take 100 mg by mouth at bedtime.   Yes [provider]  famotidine (PEPCID) 20 MG tablet One at bedtime Patient taking differently: Take 20 mg by mouth at bedtime. One at bedtime 08/29/16  Yes Tanda Rockers, MD  furosemide (LASIX) 40 MG tablet Take 1 tablet (40 mg total) by mouth daily. Patient taking differently: Take 40 mg by mouth at bedtime.  08/10/16 05/23/17 Yes Simmons, Brittainy M, PA-C  metFORMIN (GLUCOPHAGE) 500 MG tablet Take 1 tablet (500 mg total) by mouth daily with breakfast. 12/28/16  Yes Lucille Passy, MD  omeprazole (PRILOSEC) 20 MG capsule Take 1 capsule (20 mg total) by mouth 2 (two) times daily. 09/20/16  Yes Lucille Passy, MD  promethazine (PHENERGAN) 25 MG tablet Take 1 tablet (25 mg total) by mouth every 6 (six) hours as needed for nausea or vomiting. 12/26/16  Yes Lucille Passy, MD  rizatriptan (MAXALT-MLT) 10 MG disintegrating tablet Take 1 tablet (10 mg total) by mouth as needed for migraine. May repeat in 2 hours if needed. Do not take more than 3 tablets in a week. 03/14/16  Yes Pleas Koch, NP  traZODone (DESYREL) 150 MG tablet Take 1 tablet (150 mg total) by mouth at bedtime. 05/13/17  Yes Lucille Passy, MD  venlafaxine XR (EFFEXOR XR) 37.5 MG 24 hr capsule Take 1 capsule (37.5 mg total) by mouth daily. Increase to 2 capsules in 1 week 04/17/17 04/17/18 Yes Eksir, Richard Miu, MD  VENTOLIN  HFA [phone] Base) MCG/ACT inhaler Inhale 2 puffs into the lungs daily as needed for shortness of breath. 01/02/17  Yes [provider]    Family History Family History  Problem Relation Age of Onset  . Diabetes Mother   . Hyperlipidemia Mother   . Hypertension Mother   . Anxiety disorder Mother   . Depression Mother   . Drug abuse Mother   . Prostate cancer Father   . Alcohol abuse Father   . Lung cancer Maternal Grandfather        smoked  . Emphysema Maternal Grandfather        smoked  . COPD Maternal Grandmother        never smoked   . Dementia Maternal Grandmother   . Thyroid cancer Paternal Aunt   .  Colon cancer Neg Hx   . Rectal cancer Neg Hx   . Stomach cancer Neg Hx   . Migraines Neg Hx     Social History Social History  Substance Use Topics  . Smoking status: Current Every Day Smoker    Packs/day: 1.00    Years: 35.00    Types: Cigarettes, E-cigarettes  . Smokeless tobacco: Never Used     Comment: 04/02/17 cigarettes 1.5 PPD  . Alcohol use No     Allergies   Penicillins and Topamax [topiramate]   Review of Systems Review of Systems  Constitutional: Negative for appetite change and fatigue.  HENT: Negative for congestion, ear discharge and sinus pressure.   Eyes: Negative for discharge.  Respiratory: Negative for cough.   Cardiovascular: Positive for chest pain.  Gastrointestinal: Negative for abdominal pain and diarrhea.  Genitourinary: Negative for frequency and hematuria.  Musculoskeletal: Negative for back pain.  Skin: Negative for rash.  Neurological: Negative for seizures and headaches.  Psychiatric/Behavioral: Negative for hallucinations.     Physical Exam Updated Vital Signs BP 110/73   Pulse 84   Temp 98.4 F (36.9 C) (Oral)   Resp 15   Ht 5\' 2"  (1.575 m)   Wt 72.6 kg (160 lb)   LMP 02/12/2014   SpO2 98%   BMI 29.26 kg/m   Physical Exam  Constitutional: She is oriented to person, place, and time. She appears  well-developed.  HENT:  Head: Normocephalic.  Eyes: Conjunctivae and EOM are normal. No scleral icterus.  Neck: Neck supple. No thyromegaly present.  Cardiovascular: Normal rate and regular rhythm.  Exam reveals no gallop and no friction rub.   No murmur heard. Pulmonary/Chest: No stridor. She has no wheezes. She has no rales. She exhibits no tenderness.  Abdominal: She exhibits no distension. There is no tenderness. There is no rebound.  Musculoskeletal: Normal range of motion. She exhibits no edema.  Lymphadenopathy:    She has no cervical adenopathy.  Neurological: She is oriented to person, place, and time. She exhibits normal muscle tone. Coordination normal.  Skin: No rash noted. No erythema.  Psychiatric: She has a normal mood and affect. Her behavior is normal.     ED Treatments / Results  Labs (all labs ordered are listed, but only abnormal results are displayed) Labs Reviewed  BASIC METABOLIC PANEL - Abnormal; Notable for the following:       Result Value   Potassium 2.7 (*)    Chloride 95 (*)    Glucose, Bld 181 (*)    Creatinine, Ser 1.21 (*)    GFR calc non Af Amer 51 (*)    GFR calc Af Amer 59 (*)    All other components within normal limits  CBC - Abnormal; Notable for the following:    Hemoglobin 15.6 (*)    All other components within normal limits  HEPATIC FUNCTION PANEL - Abnormal; Notable for the following:    Bilirubin, Direct <0.1 (*)    All other components within normal limits  CBG MONITORING, ED - Abnormal; Notable for the following:    Glucose-Capillary 204 (*)    All other components within normal limits  D-DIMER, QUANTITATIVE (NOT AT [facility])  URINALYSIS, ROUTINE W REFLEX MICROSCOPIC  RAPID URINE DRUG SCREEN, HOSP PERFORMED  HEMOGLOBIN A1C  TROPONIN I  TROPONIN I  TROPONIN I  LIPID PANEL  MAGNESIUM  PHOSPHORUS  I-STAT TROPOININ, ED    EKG   Radiology Dg Chest 2 View  Result Date: 05/23/2017 CLINICAL  DATA:  51 year old presenting with  three-day history of chest pain and intermittent cough. Current smoker. EXAM: CHEST  2 VIEW COMPARISON:  CTA chest 07/26/2016. Chest x-rays 11/09/2015, 04/25/2015, 07/02/2010. FINDINGS: Cardiomediastinal silhouette unremarkable, unchanged. Linear atelectasis or scarring in the right middle lobe and lingula. Lungs otherwise clear. Pulmonary vascularity normal. No pleural effusions. No pneumothorax. Visualized bony thorax intact. IMPRESSION: Linear atelectasis or scarring in the right middle lobe and lingula. No acute cardiopulmonary disease otherwise. Electronically Signed   By: Evangeline Dakin M.D.   On: 05/23/2017 14:47    Procedures Procedures (including critical care time)  Medications Ordered in ED Medications  potassium chloride 10 mEq in 100 mL IVPB (10 mEq Intravenous New Bag/Given 05/23/17 1607)  amitriptyline (ELAVIL) tablet 75 mg (not administered)  clonazePAM (KLONOPIN) tablet 1 mg (not administered)  traZODone (DESYREL) tablet 150 mg (not administered)  venlafaxine XR (EFFEXOR-XR) 24 hr capsule 37.5 mg (not administered)  albuterol (PROVENTIL) (2.5 MG/3ML) 0.083% nebulizer solution 3 mL (not administered)  pantoprazole (PROTONIX) EC tablet 40 mg (not administered)  famotidine (PEPCID) tablet 20 mg (not administered)  docusate sodium (COLACE) capsule 100 mg (not administered)  furosemide (LASIX) tablet 40 mg (not administered)  acetaminophen (TYLENOL) tablet 650 mg (not administered)  ondansetron (ZOFRAN) injection 4 mg (not administered)  insulin aspart (novoLOG) injection 0-9 Units (not administered)  insulin aspart (novoLOG) injection 0-5 Units (not administered)  0.9 %  sodium chloride infusion (not administered)  enoxaparin (LOVENOX) injection 40 mg (not administered)  morphine 4 MG/ML injection 2 mg (not administered)  gi cocktail (Maalox,Lidocaine,Donnatal) (not administered)  aspirin EC tablet 325 mg (not administered)  potassium chloride SA (K-DUR,KLOR-CON) CR tablet 40  mEq (not administered)  sodium chloride 0.9 % bolus 500 mL (500 mLs Intravenous New Bag/Given 05/23/17 1515)  potassium chloride SA (K-DUR,KLOR-CON) CR tablet 40 mEq (40 mEq Oral Given 05/23/17 1605)  morphine 4 MG/ML injection 4 mg (4 mg Intravenous Given 05/23/17 1605)  nitroGLYCERIN (NITROGLYN) 2 % ointment 1 inch (1 inch Topical Given 05/23/17 1605)     Initial Impression / Assessment and Plan / ED Course  I have reviewed the triage vital signs and the nursing notes.  Pertinent labs & imaging results that were available during my care of the patient were reviewed by me and considered in my medical decision making (see chart for details).     Patient with chest discomfort and near syncopal episode and some ST depression. Troponin was normal. She will be admitted to medicine for further workup  Final Clinical Impressions(s) / ED Diagnoses   Final diagnoses:  Chest pain in adult    New Prescriptions New Prescriptions   No medications on file     Milton Ferguson, MD 05/23/17 1622

## 2017-05-23 NOTE — Telephone Encounter (Signed)
Patient Name: Ashley [name]  DOB: 1966/07/16    Initial Comment caller states for past 3 days when she gets up an walks she feels like she is going to pass out and she gets dizzy and her neck gets stiff. when she sits back down its feels better .    Nurse Assessment  Nurse: Raphael Gibney, RN, Vanita Ingles Date/Time (Eastern Time): 05/23/2017 11:34:50 AM  Confirm and document reason for call. If symptomatic, describe symptoms. ---Caller states for the past 3 days when she gets up, she gets lightheaded and feels like she is going to pass out. Neck gets stiff, she breaks out in a cold sweat and then she gets lightheaded. she is sitting down right now. Blood sugar 117.  Does the patient have any new or worsening symptoms? ---Yes  Will a triage be completed? ---Yes  Related visit to physician within the last 2 weeks? ---No  Does the PT have any chronic conditions? (i.e. diabetes, asthma, etc.) ---Yes  List chronic conditions. ---diabetes  Is the patient pregnant or possibly pregnant? (Ask all females between the ages of 18-55) ---No  Is this a behavioral health or substance abuse call? ---No     Guidelines    Guideline Title Affirmed Question Affirmed Notes  Dizziness - Lightheadedness SEVERE dizziness (e.g., unable to stand, requires support to walk, feels like passing out now)    Final Disposition User   Go to ED Now (or PCP triage) Raphael Gibney, RN, Vera    Comments  No appts avialable at [facility] within 4 hrs. Pt states she will go to the ER at [facility] - ED   Disagree/Comply: Comply

## 2017-05-23 NOTE — Telephone Encounter (Signed)
Per chart review tab pt went to [facility]. 

## 2017-05-23 NOTE — ED Notes (Signed)
Admitting at the bedside.  

## 2017-05-24 DIAGNOSIS — N179 Acute kidney failure, unspecified: Secondary | ICD-10-CM | POA: Diagnosis not present

## 2017-05-24 DIAGNOSIS — E876 Hypokalemia: Secondary | ICD-10-CM

## 2017-05-24 DIAGNOSIS — R42 Dizziness and giddiness: Secondary | ICD-10-CM | POA: Diagnosis not present

## 2017-05-24 DIAGNOSIS — M542 Cervicalgia: Secondary | ICD-10-CM | POA: Diagnosis not present

## 2017-05-24 DIAGNOSIS — R0789 Other chest pain: Secondary | ICD-10-CM

## 2017-05-24 DIAGNOSIS — R079 Chest pain, unspecified: Secondary | ICD-10-CM | POA: Diagnosis not present

## 2017-05-24 DIAGNOSIS — F4323 Adjustment disorder with mixed anxiety and depressed mood: Secondary | ICD-10-CM

## 2017-05-24 DIAGNOSIS — E119 Type 2 diabetes mellitus without complications: Secondary | ICD-10-CM | POA: Diagnosis not present

## 2017-05-24 LAB — BASIC METABOLIC PANEL
Anion gap: 10 (ref 5–15)
BUN: 15 mg/dL (ref 6–20)
CALCIUM: 8.3 mg/dL — AB (ref 8.9–10.3)
CO2: 24 mmol/L (ref 22–32)
CREATININE: 1.1 mg/dL — AB (ref 0.44–1.00)
Chloride: 100 mmol/L — ABNORMAL LOW (ref 101–111)
GFR, EST NON AFRICAN AMERICAN: 57 mL/min — AB (ref >60)
GLUCOSE: 167 mg/dL — AB (ref 65–99)
Potassium: 4 mmol/L (ref 3.5–5.1)
Sodium: 134 mmol/L — ABNORMAL LOW (ref 135–145)

## 2017-05-24 LAB — MAGNESIUM: MAGNESIUM: 1.8 mg/dL (ref 1.7–2.4)

## 2017-05-24 LAB — TROPONIN I

## 2017-05-24 LAB — GLUCOSE, CAPILLARY: Glucose-Capillary: 146 mg/dL — ABNORMAL HIGH (ref 65–99)

## 2017-05-24 LAB — HEMOGLOBIN A1C
HEMOGLOBIN A1C: 6.1 % — AB (ref 4.8–5.6)
MEAN PLASMA GLUCOSE: 128 mg/dL

## 2017-05-24 MED ORDER — POTASSIUM CHLORIDE ER 20 MEQ PO TBCR: 20.0000 meq | EXTENDED_RELEASE_TABLET | Freq: Every day | ORAL | 0 refills | Status: DC

## 2017-05-24 NOTE — Plan of Care (Signed)
Problem: Safety: Goal: Ability to remain free from injury will improve Outcome: Progressing Pt advised not to get OOB without assistance.

## 2017-05-27 ENCOUNTER — Ambulatory Visit (INDEPENDENT_AMBULATORY_CARE_PROVIDER_SITE_OTHER): Payer: BLUE CROSS/BLUE SHIELD | Admitting: Licensed Clinical Social Worker

## 2017-05-27 DIAGNOSIS — F331 Major depressive disorder, recurrent, moderate: Secondary | ICD-10-CM | POA: Diagnosis not present

## 2017-05-27 NOTE — Discharge Summary (Signed)
Physician Discharge Summary  [name]:[id number] DOB: 1966/08/28 DOA: 05/23/2017  PCP: Lucille Passy, MD  Admit date: 05/23/2017 Discharge date: 05/24/2017  Time spent: 35 minutes  Recommendations for Outpatient Follow-up:  1. PCP in 1 week, please check Bmet at FU, started on KCL 2.  Discharge Diagnoses:  Principal Problem:   Chest pain Active Problems:   Anxiety state   ADJUSTMENT DISORDER WITH MIXED FEATURES   HLD (hyperlipidemia)   Chronic bronchitis ([facility])   Diabetes ([facility])   Tobacco abuse   Somatization disorder   Generalized weakness   Hypokalemia   Neck pain   Acute kidney injury ([place])   Atypical chest pain   Discharge Condition: stable  Diet recommendation: heart healthy  Filed Weights   05/23/17 1311 05/23/17 1741 05/24/17 0352  Weight: 72.6 kg (160 lb) 71 kg (156 lb 9.6 oz) 71.4 kg (157 lb 6.5 oz)    History of present illness:   Ashley [name] is a 50 y.o. female with medical history significant for tobacco use, GERD, diabetes, chronic migraines, anxiety, adjustment disorder with mixed features, anxiety state, somatotype sensation disorder presents to  emergency department with chief complaint intermittent chest pain dizziness generalized weakness  Hospital Course:  1. Chest pain.  -atypical with numerous somatic complaints -ruled out for ACS,, troponins negative -she had a cardiac cath 10 months ago revealing no angiographic evidence of CAD normal LV systolic function and normal LV filling pressures. Recommendations at that time no further ischemic workup. -no clinical suspicion for PE -discharged home in a stable condition  #2. Neck pain.  Muscular in origin likely -recommended muscle rubs/stretching exercices/supportive care etc  3. Generalized weakness/dizziness -mild dehydration and hypokalemia noted, could be contributing -hydrated and KCL given, advised daily KCL since she take lasix, and Bmet in 1 week  #4. Acute kidney injury. Likely  related decreased oral intake. Creatinine 1.21 on admission. Baseline appears to be 0.70. -improved with hydration to 1.1  #5. Hypokalemia. Potassium level 2.7 -due to lasix, corrected, daily KCL started  6. Diabetes. Diet controlled. -continue home regimen  #7. Somatic disorder. Currently under the care behavioral health. Of note patient was admitted 3 weeks ago with complaints bilateral leg numbness. Evaluated by neurology at that time who opined examination nonphysiologic and may be conversion versus malingering versus factitious disorder. This admission case discussed with Dr Louretta Shorten psychiatry and he recommended that the patient follow-up with her own psychiatrist and no need for inpatient psych consultation. Currently she denies suicidal or homicidal ideation. -Continue home meds -Outpatient follow-up  #8. Tobacco use. -Cessation counseled  Discharge Exam: Vitals:   05/24/17 0807 05/24/17 0908  BP: (!) 111/56   Pulse:    Resp:    Temp:  97.9 F (36.6 C)    General:AAOx3 Cardiovascular:S1S2/RRR Respiratory:CTAB  Discharge Instructions   Discharge Instructions    Diet - low sodium heart healthy    Complete by:  As directed    Diet Carb Modified    Complete by:  As directed    Increase activity slowly    Complete by:  As directed      Discharge Medication List as of 05/24/2017 10:43 AM    START taking these medications   Details  potassium chloride 20 MEQ TBCR Take 20 mEq by mouth daily., Starting Fri 05/24/2017, Print      CONTINUE these medications which have NOT CHANGED   Details  amitriptyline (ELAVIL) 50 MG tablet TAKE ONE AND ONE-HALF TABLET BY MOUTH AT  DINNER TIME, Normal    aspirin EC 81 MG tablet Take 81 mg by mouth daily., Historical Med    clonazePAM (KLONOPIN) 1 MG tablet Take 1 tablet (1 mg total) by mouth 3 (three) times daily as needed. for anxiety, Starting Mon 05/13/2017, Phone In    dicyclomine (BENTYL) 10 MG capsule TAKE ONE CAPSULE  BY MOUTH TWICE DAILY AS NEEDED FOR  CRAMPING  AND  ABDOMINAL  PAIN, Normal    docusate sodium (COLACE) 100 MG capsule Take 100 mg by mouth at bedtime., Historical Med    famotidine (PEPCID) 20 MG tablet One at bedtime, Normal    furosemide (LASIX) 40 MG tablet Take 1 tablet (40 mg total) by mouth daily., Starting Fri 08/10/2016, Until Thu 05/23/2017, Normal    metFORMIN (GLUCOPHAGE) 500 MG tablet Take 1 tablet (500 mg total) by mouth daily with breakfast., Starting Fri 12/28/2016, Normal    omeprazole (PRILOSEC) 20 MG capsule Take 1 capsule (20 mg total) by mouth 2 (two) times daily., Starting Thu 09/20/2016, Normal    promethazine (PHENERGAN) 25 MG tablet Take 1 tablet (25 mg total) by mouth every 6 (six) hours as needed for nausea or vomiting., Starting Wed 12/26/2016, Phone In    rizatriptan (MAXALT-MLT) 10 MG disintegrating tablet Take 1 tablet (10 mg total) by mouth as needed for migraine. May repeat in 2 hours if needed. Do not take more than 3 tablets in a week., Starting 03/14/2016, Until Discontinued, Normal    traZODone (DESYREL) 150 MG tablet Take 1 tablet (150 mg total) by mouth at bedtime., Starting Mon 05/13/2017, Phone In    venlafaxine XR (EFFEXOR XR) 37.5 MG 24 hr capsule Take 1 capsule (37.5 mg total) by mouth daily. Increase to 2 capsules in 1 week, Starting Wed 04/17/2017, Until Thu 04/17/2018, Normal    VENTOLIN HFA 108 (90 Base) MCG/ACT inhaler Inhale 2 puffs into the lungs daily as needed for shortness of breath., Starting Wed 01/02/2017, Historical Med       Allergies  Allergen Reactions  . Penicillins Anaphylaxis    Throat swells, Has patient had a PCN reaction causing immediate rash, facial/tongue/throat swelling, SOB or lightheadedness with hypotension: Yes Has patient had a PCN reaction causing severe rash involving mucus membranes or skin necrosis: No Has patient had a PCN reaction that required hospitalization No Has patient had a PCN reaction occurring within the last  10 years: No If all of the above answers are "NO", then may proceed with Cephalosporin use.   . Topamax [Topiramate] Other (See Comments)    Hands and feet to go numb   Follow-up Information    Lucille Passy, MD. Schedule an appointment as soon as possible for a visit in 1 week(s).   Specialty:  Family Medicine Why:  Please check Bmet in 1-2weeks  Contact information: [address] [place] [address] [phone]            The results of significant diagnostics from this hospitalization (including imaging, microbiology, ancillary and laboratory) are listed below for reference.    Significant Diagnostic Studies: Dg Chest 2 View  Result Date: 05/23/2017 CLINICAL DATA:  51 year old presenting with three-day history of chest pain and intermittent cough. Current smoker. EXAM: CHEST  2 VIEW COMPARISON:  CTA chest 07/26/2016. Chest x-rays 11/09/2015, 04/25/2015, 07/02/2010. FINDINGS: Cardiomediastinal silhouette unremarkable, unchanged. Linear atelectasis or scarring in the right middle lobe and lingula. Lungs otherwise clear. Pulmonary vascularity normal. No pleural effusions. No pneumothorax. Visualized bony thorax intact. IMPRESSION: Linear  atelectasis or scarring in the right middle lobe and lingula. No acute cardiopulmonary disease otherwise. Electronically Signed   By: Evangeline Dakin M.D.   On: 05/23/2017 14:47   Dg Cervical Spine Complete  Result Date: 05/23/2017 CLINICAL DATA:  51 year old female with left side neck pain for 3 days with no known injury. EXAM: CERVICAL SPINE - COMPLETE 4+ VIEW COMPARISON:  Neck CT 02/07/2016. FINDINGS: Normal prevertebral soft tissue contour. Chronic straightening of cervical lordosis. Chronic C5-C6 disc space loss and mild anterior lower cervical endplate osteophytes. Bilateral posterior element alignment is within normal limits. Cervicothoracic junction alignment is within normal limits. Normal C1-C2 alignment and joint space. Normal AP  alignment, odontoid, and lung apices. No acute osseous abnormality identified. IMPRESSION: No acute osseous abnormality in the cervical spine. Chronic C5-C6 disc space loss and mild lower cervical endplate spurring. Electronically Signed   By: Genevie Ann M.D.   On: 05/23/2017 16:51    Microbiology: No results found for this or any previous visit (from the past 240 hour(s)).   Labs: Basic Metabolic Panel:  Recent Labs Lab 05/23/17 1322 05/23/17 1838 05/24/17 0837  NA 135  --  134*  K 2.7*  --  4.0  CL 95*  --  100*  CO2 26  --  24  GLUCOSE 181*  --  167*  BUN 11  --  15  CREATININE 1.21*  --  1.10*  CALCIUM 9.3  --  8.3*  MG  --  1.8 1.8  PHOS  --  4.6  --    Liver Function Tests:  Recent Labs Lab 05/23/17 1354  AST 26  ALT 22  ALKPHOS 117  BILITOT 0.7  PROT 7.3  ALBUMIN 3.9   No results for input(s): LIPASE, AMYLASE in the last 168 hours. No results for input(s): AMMONIA in the last 168 hours. CBC:  Recent Labs Lab 05/23/17 1322  WBC 8.7  HGB 15.6*  HCT 44.7  MCV 90.7  PLT 343   Cardiac Enzymes:  Recent Labs Lab 05/23/17 1838 05/23/17 2116 05/24/17 0043  TROPONINI <0.03 <0.03 <0.03   BNP: BNP (last 3 results) No results for input(s): BNP in the last 8760 hours.  ProBNP (last 3 results)  Recent Labs  07/25/16 1315  PROBNP 12.0    CBG:  Recent Labs Lab 05/23/17 1321 05/23/17 1747 05/23/17 2005 05/24/17 0735  GLUCAP 204* 104* 178* 146*       Signed:  Danon Lograsso MD.  [facility] 05/27/2017, 1:01 PM

## 2017-05-29 ENCOUNTER — Encounter (HOSPITAL_COMMUNITY): Payer: Self-pay | Admitting: Licensed Clinical Social Worker

## 2017-05-29 NOTE — Progress Notes (Signed)
   THERAPIST PROGRESS NOTE  Session Time: 4:10am-5pm  Participation Level: Active  Behavioral Response: CasualAlertDepressed  Type of Therapy: Individual Therapy  Treatment Goals addressed: Coping  Interventions: CBT  Summary: Ashley [name] is a 51 y.o. female who presents with depression and anxiety.  Pt Discussed her psychiatric symptoms and current life events.Pt has been to the ED again for her continued symptoms of dizziness. It was determined at this visit that her potassium level was low. She now takes a potassioum supplement. Pt shared that she was encouraged at this ED visit because she as not told by medical staff that her symptoms are "all in your head." Discussed basic CBT concepts and the thought emotion connection and alternative perspectives. Pt has begun using her practiced boundaries with her family and has tried to take better care of herself. Role played more boundaries with daughter and son in law. This was a little more difficult for pt and she is not comfortable with this but will try it out.   Suicidal/Homicidal: Nowithout intent/plan  Therapist Response: Evaluated pt's current functioning and reviewed progress. Assisted pt processing her medical conditions, CBt concepts, boundaries. Assisted pt processing for the management of her stressors.   Plan: Return again in 2 weeks.  Diagnosis: Axis I:Adjustment disorder with mixed anxiety and depressed mood        MACKENZIE,LISBETH S, LCAS  05/27/17

## 2017-05-30 ENCOUNTER — Encounter: Payer: Self-pay | Admitting: Family Medicine

## 2017-05-30 ENCOUNTER — Ambulatory Visit (INDEPENDENT_AMBULATORY_CARE_PROVIDER_SITE_OTHER): Payer: BLUE CROSS/BLUE SHIELD | Admitting: Family Medicine

## 2017-05-30 VITALS — BP 130/90 | HR 112 | Temp 98.1°F | Ht 62.0 in | Wt 155.0 lb

## 2017-05-30 DIAGNOSIS — I200 Unstable angina: Secondary | ICD-10-CM | POA: Diagnosis not present

## 2017-05-30 DIAGNOSIS — Z09 Encounter for follow-up examination after completed treatment for conditions other than malignant neoplasm: Secondary | ICD-10-CM | POA: Diagnosis not present

## 2017-05-30 DIAGNOSIS — E876 Hypokalemia: Secondary | ICD-10-CM

## 2017-05-30 DIAGNOSIS — N179 Acute kidney failure, unspecified: Secondary | ICD-10-CM

## 2017-05-30 LAB — BASIC METABOLIC PANEL
BUN: 16 mg/dL (ref 6–23)
CALCIUM: 10 mg/dL (ref 8.4–10.5)
CO2: 31 mEq/L (ref 19–32)
CREATININE: 1.07 mg/dL (ref 0.40–1.20)
Chloride: 95 mEq/L — ABNORMAL LOW (ref 96–112)
GFR: 57.38 mL/min — AB (ref >60.00)
Glucose, Bld: 202 mg/dL — ABNORMAL HIGH (ref 70–99)
POTASSIUM: 3.1 meq/L — AB (ref 3.5–5.1)
Sodium: 137 mEq/L (ref 135–145)

## 2017-05-30 NOTE — Progress Notes (Signed)
Pre visit review using our clinic review tool, if applicable. No additional management support is needed unless otherwise documented below in the visit note. 

## 2017-05-30 NOTE — Patient Instructions (Signed)
Great to see you. I will call you with your lab results from today .   

## 2017-05-30 NOTE — Progress Notes (Signed)
Subjective:   Patient ID: Ashley [name], female    DOB: 20-Mar-1966, 51 y.o.   MRN: [id number]  Ashley [name] is a pleasant 51 y.o. year old female who presents to clinic today with Hospitalization Follow-up  on 05/30/2017  HPI:  Admitted to the hospital 5/31- 05/24/17 for chest pain.  Notes and studies reviewed.  Hospital Course:  1. Chest pain.  -atypical with numerous somatic complaints -ruled out for ACS,, troponins negative -she had a cardiac cath 10 months ago revealing no angiographic evidence of CAD normal LV systolic function and normal LV filling pressures. Recommendations at that time no further ischemic workup. -no clinical suspicion for PE -discharged home in a stable condition  #2. Neck pain.  Muscular in origin likely -recommended muscle rubs/stretching exercices/supportive care etc  3. Generalized weakness/dizziness -mild dehydration and hypokalemia noted, could be contributing -hydrated and KCL given, advised daily KCL since she take lasix, and Bmet in 1 week  #4. Acute kidney injury. Likely related decreased oral intake. Creatinine 1.21 on admission. Baseline appears to be 0.70. -improved with hydration to 1.1  Lab Results  Component Value Date   CREATININE 1.10 (H) 05/24/2017    #5. Hypokalemia. Potassium level 2.7 -due to lasix, corrected, daily KCL started  Lab Results  Component Value Date   NA 134 (L) 05/24/2017   K 4.0 05/24/2017   CL 100 (L) 05/24/2017   CO2 24 05/24/2017     6. Diabetes. Diet controlled. -continue home regimen  #7. Somatic disorder. Currently under the care behavioral health. Of note patient was admitted 3 weeks ago with complaints bilateral leg numbness. Evaluated by neurology at that time who opined examination nonphysiologic and may be conversion versus malingering versus factitious disorder. This admission case discussed with Dr Louretta Shorten psychiatry and he recommended that the patient follow-up with her own  psychiatrist and no need for inpatient psych consultation. Currently she denies suicidal or homicidal ideation. -Continue home meds -Outpatient follow-up  #8. Tobacco use. -Cessation counseled  Dg Chest 2 View  Result Date: 05/23/2017 CLINICAL DATA:  51 year old presenting with three-day history of chest pain and intermittent cough. Current smoker. EXAM: CHEST  2 VIEW COMPARISON:  CTA chest 07/26/2016. Chest x-rays 11/09/2015, 04/25/2015, 07/02/2010. FINDINGS: Cardiomediastinal silhouette unremarkable, unchanged. Linear atelectasis or scarring in the right middle lobe and lingula. Lungs otherwise clear. Pulmonary vascularity normal. No pleural effusions. No pneumothorax. Visualized bony thorax intact. IMPRESSION: Linear atelectasis or scarring in the right middle lobe and lingula. No acute cardiopulmonary disease otherwise. Electronically Signed   By: Evangeline Dakin M.D.   On: 05/23/2017 14:47   Dg Cervical Spine Complete  Result Date: 05/23/2017 CLINICAL DATA:  51 year old female with left side neck pain for 3 days with no known injury. EXAM: CERVICAL SPINE - COMPLETE 4+ VIEW COMPARISON:  Neck CT 02/07/2016. FINDINGS: Normal prevertebral soft tissue contour. Chronic straightening of cervical lordosis. Chronic C5-C6 disc space loss and mild anterior lower cervical endplate osteophytes. Bilateral posterior element alignment is within normal limits. Cervicothoracic junction alignment is within normal limits. Normal C1-C2 alignment and joint space. Normal AP alignment, odontoid, and lung apices. No acute osseous abnormality identified. IMPRESSION: No acute osseous abnormality in the cervical spine. Chronic C5-C6 disc space loss and mild lower cervical endplate spurring. Electronically Signed   By: Genevie Ann M.D.   On: 05/23/2017 16:51   Current Outpatient Prescriptions on File Prior to Visit  Medication Sig Dispense Refill  . amitriptyline (ELAVIL) 50 MG tablet TAKE ONE AND  ONE-HALF TABLET BY MOUTH AT  DINNER TIME 45 tablet 3  . aspirin EC 81 MG tablet Take 81 mg by mouth daily.    . clonazePAM (KLONOPIN) 1 MG tablet Take 1 tablet (1 mg total) by mouth 3 (three) times daily as needed. for anxiety 90 tablet 0  . dicyclomine (BENTYL) 10 MG capsule TAKE ONE CAPSULE BY MOUTH TWICE DAILY AS NEEDED FOR  CRAMPING  AND  ABDOMINAL  PAIN 180 capsule 1  . docusate sodium (COLACE) 100 MG capsule Take 100 mg by mouth at bedtime.    . famotidine (PEPCID) 20 MG tablet One at bedtime (Patient taking differently: Take 20 mg by mouth at bedtime. One at bedtime) 30 tablet 11  . metFORMIN (GLUCOPHAGE) 500 MG tablet Take 1 tablet (500 mg total) by mouth daily with breakfast. 30 tablet 3  . omeprazole (PRILOSEC) 20 MG capsule Take 1 capsule (20 mg total) by mouth 2 (two) times daily. 180 capsule 1  . potassium chloride 20 MEQ TBCR Take 20 mEq by mouth daily. 30 tablet 0  . promethazine (PHENERGAN) 25 MG tablet Take 1 tablet (25 mg total) by mouth every 6 (six) hours as needed for nausea or vomiting. 30 tablet 0  . rizatriptan (MAXALT-MLT) 10 MG disintegrating tablet Take 1 tablet (10 mg total) by mouth as needed for migraine. May repeat in 2 hours if needed. Do not take more than 3 tablets in a week. 10 tablet 3  . traZODone (DESYREL) 150 MG tablet Take 1 tablet (150 mg total) by mouth at bedtime. 90 tablet 0  . venlafaxine XR (EFFEXOR XR) 37.5 MG 24 hr capsule Take 1 capsule (37.5 mg total) by mouth daily. Increase to 2 capsules in 1 week 90 capsule 1  . VENTOLIN HFA 108 (90 Base) MCG/ACT inhaler Inhale 2 puffs into the lungs daily as needed for shortness of breath.  0  . furosemide (LASIX) 40 MG tablet Take 1 tablet (40 mg total) by mouth daily. (Patient taking differently: Take 40 mg by mouth at bedtime. ) 30 tablet 6   No current facility-administered medications on file prior to visit.     Allergies  Allergen Reactions  . Penicillins Anaphylaxis    Throat swells, Has patient had a PCN reaction causing  immediate rash, facial/tongue/throat swelling, SOB or lightheadedness with hypotension: Yes Has patient had a PCN reaction causing severe rash involving mucus membranes or skin necrosis: No Has patient had a PCN reaction that required hospitalization No Has patient had a PCN reaction occurring within the last 10 years: No If all of the above answers are "NO", then may proceed with Cephalosporin use.   . Topamax [Topiramate] Other (See Comments)    Hands and feet to go numb    Past Medical History:  Diagnosis Date  . Anxiety   . Bronchitis    uses inhaler if needed for bronchitis, lasted used -2014  . Cataract    bilateral  . Chronic headaches    migraines - in past, uses Phenergan for nausea   . Diabetes mellitus without complication ([place])    taken off Metformin since 04/2014, HgbA1C - normal, will follow up with PCP- Dr. Deborra Medina, 07/2014  . GERD (gastroesophageal reflux disease)   . H/O exercise stress test 2011   done at [facility]- told that it was WNL, done due to pt. having panic attacks   . History of blood transfusion 11/14/1966   at birth in [place], [place], unsure number of units  .  Poor dentition    very poor oral health   . SVD (spontaneous vaginal delivery)    x 3  . Tobacco abuse     Past Surgical History:  Procedure Laterality Date  . ABDOMINAL HYSTERECTOMY    . APPENDECTOMY  1988  . CARDIAC CATHETERIZATION N/A 08/17/2016   Procedure: Left Heart Cath and Coronary Angiography;  Surgeon: Burnell Blanks, MD;  Location: [facility];  Service: Cardiovascular;  Laterality: N/A;  . CHOLECYSTECTOMY N/A 06/07/2014   Procedure: LAPAROSCOPIC CHOLECYSTECTOMY;  Surgeon: Ralene Ok, MD;  Location: [facility];  Service: General;  Laterality: N/A;  . CHOLECYSTECTOMY  June 07 2014  . COLONOSCOPY  02/16/2014   normal   . ESOPHAGOGASTRODUODENOSCOPY  12/28/2013  . KNEE ARTHROSCOPY  1995   left  . LAPAROSCOPIC ASSISTED VAGINAL HYSTERECTOMY N/A 04/15/2014   Procedure:  LAPAROSCOPIC ASSISTED VAGINAL HYSTERECTOMY;  Surgeon: Marylynn Pearson, MD;  Location: [facility];  Service: Gynecology;  Laterality: N/A;  . LAPAROSCOPIC BILATERAL SALPINGO OOPHERECTOMY Bilateral 04/15/2014   Procedure: LAPAROSCOPIC BILATERAL SALPINGO OOPHORECTOMY;  Surgeon: Marylynn Pearson, MD;  Location: [facility];  Service: Gynecology;  Laterality: Bilateral;  . TUBAL LIGATION      Family History  Problem Relation Age of Onset  . Diabetes Mother   . Hyperlipidemia Mother   . Hypertension Mother   . Anxiety disorder Mother   . Depression Mother   . Drug abuse Mother   . Prostate cancer Father   . Alcohol abuse Father   . Lung cancer Maternal Grandfather        smoked  . Emphysema Maternal Grandfather        smoked  . COPD Maternal Grandmother        never smoked   . Dementia Maternal Grandmother   . Thyroid cancer Paternal Aunt   . Colon cancer Neg Hx   . Rectal cancer Neg Hx   . Stomach cancer Neg Hx   . Migraines Neg Hx     Social History   Social History  . Marital status: Married    Spouse name: Alvester Chou  . Number of children: 6  . Years of education: 64   Occupational History  .       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 Smoking status: Current Every Day Smoker    Packs/day: 1.00    Years: 35.00    Types: Cigarettes, E-cigarettes  . Smokeless tobacco: Never Used     Comment: 04/02/17 cigarettes 1.5 PPD  . Alcohol use No  . Drug use: No  . Sexual activity: Yes    Partners: Male    Birth control/ protection: Surgical   Other Topics Concern  . Not on file   Social History Narrative   5 living children, one child is a crack cocaine addict who often breaks into their house to steal money. One child died of cerebral palsy.   Caffeine use: Dr Malachi Bonds (3 per day)   2 cups coffee per day   The PMH, PSH, Social History, Family History, Medications, and allergies have been reviewed in [facility], and have been updated if relevant.  Review of Systems    Constitutional: Positive for fatigue. Negative for fever.  Eyes: Negative.   Respiratory: Positive for chest tightness. Negative for shortness of breath.   Cardiovascular: Positive for chest pain. Negative for palpitations and leg swelling.  Gastrointestinal: Negative.   Endocrine: Negative.   Genitourinary: Negative.   Musculoskeletal: Negative.  Allergic/Immunologic: Negative.   Neurological: Positive for weakness.  All other systems reviewed and are negative.      Objective:    BP 130/90   Pulse (!) 112   Temp 98.1 F (36.7 C)   Ht 5\' 2"  (1.575 m)   Wt 155 lb (70.3 kg)   LMP 02/12/2014   SpO2 98%   BMI 28.35 kg/m    Physical Exam   General:  Well-developed,well-nourished,in no acute distress; alert,appropriate and cooperative throughout examination Head:  normocephalic and atraumatic.   Eyes:  vision grossly intact, PERRL Ears:  R ear normal and L ear normal externally, TMs clear bilaterally Nose:  no external deformity.   Mouth:  good dentition.   Neck:  No deformities, masses, or tenderness noted. Breasts:  No mass, nodules, thickening, tenderness, bulging, retraction, inflamation, nipple discharge or skin changes noted.   Lungs:  Normal respiratory effort, chest expands symmetrically. Lungs are clear to auscultation, no crackles or wheezes. Heart:  Normal rate and regular rhythm. S1 and S2 normal without gallop, murmur, click, rub or other extra sounds. Abdomen:  Bowel sounds positive,abdomen soft and non-tender without masses, organomegaly or hernias noted. Msk:  No deformity or scoliosis noted of thoracic or lumbar spine.   Extremities:  No clubbing, cyanosis, edema, or deformity noted with normal full range of motion of all joints.   Neurologic:  alert & oriented X3 and gait normal.   Skin:  Intact without suspicious lesions or rashes Psych:  Cognition and judgment appear intact. Alert and cooperative with normal attention span and concentration. No apparent  delusions, illusions, hallucinations       Assessment & Plan:   Hospital discharge follow-up  Hypokalemia - Plan: Basic metabolic panel  Unstable angina pectoris (HCC)  Acute kidney injury ([place]) - Plan: Basic metabolic panel No Follow-up on file.

## 2017-05-30 NOTE — Assessment & Plan Note (Signed)
Improved. No further work up indicated at this time. She feels psychotherapy is helping.

## 2017-05-30 NOTE — Assessment & Plan Note (Signed)
Repeat Cr today.

## 2017-05-30 NOTE — Assessment & Plan Note (Signed)
Continue potassium. Check potassium today, then decide which potassium dose she can continue.

## 2017-06-04 ENCOUNTER — Emergency Department (HOSPITAL_COMMUNITY): Payer: BLUE CROSS/BLUE SHIELD

## 2017-06-04 ENCOUNTER — Telehealth: Payer: Self-pay | Admitting: Family Medicine

## 2017-06-04 ENCOUNTER — Other Ambulatory Visit: Payer: Self-pay

## 2017-06-04 ENCOUNTER — Encounter (HOSPITAL_COMMUNITY): Payer: Self-pay | Admitting: Nurse Practitioner

## 2017-06-04 ENCOUNTER — Emergency Department (HOSPITAL_COMMUNITY)
Admission: EM | Admit: 2017-06-04 | Discharge: 2017-06-04 | Disposition: A | Discharge status: Home / Self Care | Payer: BLUE CROSS/BLUE SHIELD | Attending: Emergency Medicine | Admitting: Emergency Medicine

## 2017-06-04 ENCOUNTER — Telehealth: Payer: Self-pay

## 2017-06-04 DIAGNOSIS — R10816 Epigastric abdominal tenderness: Secondary | ICD-10-CM

## 2017-06-04 DIAGNOSIS — K2970 Gastritis, unspecified, without bleeding: Secondary | ICD-10-CM | POA: Insufficient documentation

## 2017-06-04 DIAGNOSIS — F419 Anxiety disorder, unspecified: Secondary | ICD-10-CM | POA: Insufficient documentation

## 2017-06-04 DIAGNOSIS — Z7984 Long term (current) use of oral hypoglycemic drugs: Secondary | ICD-10-CM | POA: Insufficient documentation

## 2017-06-04 DIAGNOSIS — Z79899 Other long term (current) drug therapy: Secondary | ICD-10-CM | POA: Diagnosis not present

## 2017-06-04 DIAGNOSIS — Z7982 Long term (current) use of aspirin: Secondary | ICD-10-CM | POA: Diagnosis not present

## 2017-06-04 DIAGNOSIS — R0602 Shortness of breath: Secondary | ICD-10-CM | POA: Insufficient documentation

## 2017-06-04 DIAGNOSIS — F17210 Nicotine dependence, cigarettes, uncomplicated: Secondary | ICD-10-CM | POA: Diagnosis not present

## 2017-06-04 DIAGNOSIS — R0789 Other chest pain: Secondary | ICD-10-CM | POA: Diagnosis present

## 2017-06-04 DIAGNOSIS — Z720 Tobacco use: Secondary | ICD-10-CM

## 2017-06-04 DIAGNOSIS — E119 Type 2 diabetes mellitus without complications: Secondary | ICD-10-CM | POA: Diagnosis not present

## 2017-06-04 DIAGNOSIS — R079 Chest pain, unspecified: Secondary | ICD-10-CM

## 2017-06-04 DIAGNOSIS — E876 Hypokalemia: Secondary | ICD-10-CM | POA: Diagnosis not present

## 2017-06-04 DIAGNOSIS — R110 Nausea: Secondary | ICD-10-CM

## 2017-06-04 LAB — CBC
HCT: 43.6 % (ref 36.0–46.0)
Hemoglobin: 15.2 g/dL — ABNORMAL HIGH (ref 12.0–15.0)
MCH: 31.7 pg (ref 26.0–34.0)
MCHC: 34.9 g/dL (ref 30.0–36.0)
MCV: 91 fL (ref 78.0–100.0)
PLATELETS: 404 10*3/uL — AB (ref 150–400)
RBC: 4.79 MIL/uL (ref 3.87–5.11)
RDW: 12.9 % (ref 11.5–15.5)
WBC: 9.3 10*3/uL (ref 4.0–10.5)

## 2017-06-04 LAB — BASIC METABOLIC PANEL
Anion gap: 16 — ABNORMAL HIGH (ref 5–15)
BUN: 10 mg/dL (ref 6–20)
CALCIUM: 9.2 mg/dL (ref 8.9–10.3)
CHLORIDE: 95 mmol/L — AB (ref 101–111)
CO2: 26 mmol/L (ref 22–32)
CREATININE: 0.94 mg/dL (ref 0.44–1.00)
Glucose, Bld: 125 mg/dL — ABNORMAL HIGH (ref 65–99)
Potassium: 2.9 mmol/L — ABNORMAL LOW (ref 3.5–5.1)
SODIUM: 137 mmol/L (ref 135–145)

## 2017-06-04 LAB — HEPATIC FUNCTION PANEL
ALBUMIN: 3.9 g/dL (ref 3.5–5.0)
ALK PHOS: 115 U/L (ref 38–126)
ALT: 14 U/L (ref 14–54)
AST: 31 U/L (ref 15–41)
Bilirubin, Direct: 0.3 mg/dL (ref 0.1–0.5)
Indirect Bilirubin: 0.4 mg/dL (ref 0.3–0.9)
TOTAL PROTEIN: 7.6 g/dL (ref 6.5–8.1)
Total Bilirubin: 0.7 mg/dL (ref 0.3–1.2)

## 2017-06-04 LAB — I-STAT TROPONIN, ED
TROPONIN I, POC: 0 ng/mL (ref 0.00–0.08)
TROPONIN I, POC: 0.01 ng/mL (ref 0.00–0.08)

## 2017-06-04 LAB — D-DIMER, QUANTITATIVE (NOT AT ARMC)

## 2017-06-04 MED ORDER — SODIUM CHLORIDE 0.9 % IV BOLUS (SEPSIS)
1000.0000 mL | Freq: Once | INTRAVENOUS | Status: AC
  Administered 2017-06-04: 1000 mL via INTRAVENOUS

## 2017-06-04 MED ORDER — FAMOTIDINE IN NACL 20-0.9 MG/50ML-% IV SOLN
20.0000 mg | Freq: Once | INTRAVENOUS | Status: AC
  Administered 2017-06-04: 20 mg via INTRAVENOUS
  Filled 2017-06-04: qty 50

## 2017-06-04 MED ORDER — ASPIRIN 81 MG PO CHEW
324.0000 mg | CHEWABLE_TABLET | Freq: Once | ORAL | Status: AC
  Administered 2017-06-04: 324 mg via ORAL
  Filled 2017-06-04: qty 4

## 2017-06-04 MED ORDER — ONDANSETRON HCL 4 MG/2ML IJ SOLN
4.0000 mg | Freq: Once | INTRAMUSCULAR | Status: AC
  Administered 2017-06-04: 4 mg via INTRAVENOUS
  Filled 2017-06-04: qty 2

## 2017-06-04 MED ORDER — ACETAMINOPHEN 500 MG PO TABS
1000.0000 mg | ORAL_TABLET | Freq: Once | ORAL | Status: AC
  Administered 2017-06-04: 1000 mg via ORAL
  Filled 2017-06-04: qty 2

## 2017-06-04 MED ORDER — MORPHINE SULFATE (PF) 4 MG/ML IV SOLN
4.0000 mg | Freq: Once | INTRAVENOUS | Status: AC
  Administered 2017-06-04: 4 mg via INTRAVENOUS
  Filled 2017-06-04: qty 1

## 2017-06-04 MED ORDER — ONDANSETRON 4 MG PO TBDP: 4.0000 mg | ORAL_TABLET | Freq: Three times a day (TID) | ORAL | 0 refills | Status: DC | PRN

## 2017-06-04 MED ORDER — LORAZEPAM 2 MG/ML IJ SOLN
1.0000 mg | Freq: Once | INTRAMUSCULAR | Status: AC
  Administered 2017-06-04: 1 mg via INTRAVENOUS
  Filled 2017-06-04: qty 1

## 2017-06-04 MED ORDER — GI COCKTAIL ~~LOC~~
30.0000 mL | Freq: Once | ORAL | Status: AC
  Administered 2017-06-04: 30 mL via ORAL
  Filled 2017-06-04: qty 30

## 2017-06-04 MED ORDER — POTASSIUM CHLORIDE CRYS ER 20 MEQ PO TBCR
80.0000 meq | EXTENDED_RELEASE_TABLET | Freq: Once | ORAL | Status: AC
  Administered 2017-06-04: 80 meq via ORAL
  Filled 2017-06-04: qty 4

## 2017-06-04 MED ORDER — RANITIDINE HCL 150 MG PO TABS: 150.0000 mg | ORAL_TABLET | Freq: Two times a day (BID) | ORAL | 0 refills | Status: DC

## 2017-06-04 NOTE — Telephone Encounter (Signed)
Continue taking current dose of potassium- 20 mEq daily. Will plan to recheck her potassium in a few weeks.

## 2017-06-04 NOTE — ED Notes (Signed)
Patient transported to X-ray 

## 2017-06-04 NOTE — ED Provider Notes (Signed)
[facility] DEPT Provider Note   CSN: [id number] Arrival date & time: 06/04/17  1553     History   Chief Complaint Chief Complaint  Patient presents with  . Chest Pain    HPI Ashley [name] is a 51 y.o. female with a PMHx of anxiety, somatization disorder, bronchitis, DM2, GERD, migraines, HLD, and other medical problems listed below, and PSHx of hysterectomy, appendectomy, and cholecystectomy, who presents to the ED with complaints of left-sided chest pain that began when she woke up at 8 AM while at rest, and worsened around 2:30 PM. She describes the pain is 9/10 constant sharp and pressure-like pain in the left side of her chest that radiates to the left shoulder, neck, jaw, and upper back, worse with arching forward (arching shoulders forward and making back more concave), unchanged with exertion or inspiration, and with no treatments tried prior to arrival. She reports associated nausea, diaphoresis, lightheadedness, and shortness of breath. She also reports that she's had some tingling around her mouth. She admits to being a cigarette smoker, and has a chronic "smoker's cough" however she denies any change in her cough recently. She endorses frequent fried food consumption, but denies use of NSAIDs, EtOH, or any recent suspicious food intake. +FHx of MI in her PGM and PGF. Of note, chart review reveals she was just admitted to the hospital for CP on 05/23/17-05/24/17, had negative troponins throughout stay, no echo/stress test/heart cath done since she had an echo and cath in 07/2016 which were unremarkable and did not recommend any further work up for ischemia at that time. PCP Dr. Deborra Medina at [facility].   She denies fevers, chills, change in cough, hemoptysis, wheezing, LE swelling, recent travel/surgery/immobilization, estrogen use, personal/family hx of DVT/PE, abd pain, V/D/C, hematuria, dysuria, myalgias, arthralgias, claudication, orthopnea, numbness, focal weakness, or any other complaints at  this time.   The history is provided by the patient and medical records. No language interpreter was used.  Chest Pain   This is a recurrent problem. The current episode started 6 to 12 hours ago. The problem occurs constantly. The problem has been gradually worsening. The pain is associated with rest. The pain is present in the lateral region. The pain is at a severity of 9/10. The pain is moderate. The quality of the pain is described as pressure-like and sharp. The pain radiates to the left neck, left jaw, left shoulder, left arm and upper back. Duration of episode(s) is 9 hours. The symptoms are aggravated by certain positions. Associated symptoms include back pain, diaphoresis, nausea and shortness of breath. Pertinent negatives include no abdominal pain, no claudication, no cough, no fever, no hemoptysis, no leg pain, no lower extremity edema, no numbness, no orthopnea, no vomiting and no weakness. She has tried nothing for the symptoms. The treatment provided no relief. Risk factors include smoking/tobacco exposure.  Her past medical history is significant for COPD, diabetes and hyperlipidemia.  Pertinent negatives for past medical history include no DVT and no PE.  Her family medical history is significant for CAD.  Pertinent negatives for family medical history include: no PE.  Procedure history is positive for cardiac catheterization and echocardiogram.    Past Medical History:  Diagnosis Date  . Anxiety   . Bronchitis    uses inhaler if needed for bronchitis, lasted used -2014  . Cataract    bilateral  . Chronic headaches    migraines - in past, uses Phenergan for nausea   . Diabetes mellitus without complication (  [place])    taken off Metformin since 04/2014, HgbA1C - normal, will follow up with PCP- Dr. Deborra Medina, 07/2014  . GERD (gastroesophageal reflux disease)   . H/O exercise stress test 2011   done at [facility]- told that it was WNL, done due to pt. having panic attacks   .  History of blood transfusion [birth year]1966   at birth in [place], [place], unsure number of units  . Poor dentition    very poor oral health   . SVD (spontaneous vaginal delivery)    x 3  . Tobacco abuse     Patient Active Problem List   Diagnosis Date Noted  . Hypokalemia 05/23/2017  . Neck pain 05/23/2017  . Acute kidney injury ([place]) 05/23/2017  . Atypical chest pain   . Moderate episode of recurrent major depressive disorder ([place]) 05/16/2017  . Generalized weakness 04/25/2017  . Bilateral leg numbness 04/25/2017  . Diabetes mellitus with complication ([place])   . Right arm weakness   . 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 Paresthesia 03/04/2017  . Hospital discharge follow-up 02/19/2017  . Stroke-like symptom 02/19/2017  . Tobacco abuse 01/08/2017  . Diabetes ([place]) 12/26/2016  . Chronic bronchitis ([place]) 10/20/2015  . HLD (hyperlipidemia) 04/12/2015  . Migraine with aura, intractable 08/12/2014  . Chest pain 07/23/2014  . S/P hysterectomy with oophorectomy 04/15/2014  . Hyperglycemia 01/26/2013  . Migraine 05/30/2010  . Anxiety state 09/30/2009  . INSOMNIA, CHRONIC 09/30/2009  . ADJUSTMENT DISORDER WITH MIXED FEATURES 09/30/2009  . ELEVATED BLOOD PRESSURE WITHOUT DIAGNOSIS OF HYPERTENSION 09/30/2009    Past Surgical History:  Procedure Laterality Date  . ABDOMINAL HYSTERECTOMY    . APPENDECTOMY  1988  . CARDIAC CATHETERIZATION N/A 08/17/2016   Procedure: Left Heart Cath and Coronary Angiography;  Surgeon: Burnell Blanks, MD;  Location: [facility];  Service: Cardiovascular;  Laterality: N/A;  . CHOLECYSTECTOMY N/A 06/07/2014   Procedure: LAPAROSCOPIC CHOLECYSTECTOMY;  Surgeon: Ralene Ok, MD;  Location: [facility];  Service: General;  Laterality: N/A;  . CHOLECYSTECTOMY  June 07 2014  . COLONOSCOPY  02/16/2014   normal   . ESOPHAGOGASTRODUODENOSCOPY  12/28/2013  . KNEE ARTHROSCOPY  1995   left  . LAPAROSCOPIC ASSISTED VAGINAL HYSTERECTOMY N/A 04/15/2014   Procedure:  LAPAROSCOPIC ASSISTED VAGINAL HYSTERECTOMY;  Surgeon: Marylynn Pearson, MD;  Location: [facility];  Service: Gynecology;  Laterality: N/A;  . LAPAROSCOPIC BILATERAL SALPINGO OOPHERECTOMY Bilateral 04/15/2014   Procedure: LAPAROSCOPIC BILATERAL SALPINGO OOPHORECTOMY;  Surgeon: Marylynn Pearson, MD;  Location: [facility];  Service: Gynecology;  Laterality: Bilateral;  . TUBAL LIGATION      OB History    No data available       Home Medications    Prior to Admission medications   Medication Sig Start Date End Date Taking? Authorizing Provider  amitriptyline (ELAVIL) 50 MG tablet TAKE ONE AND ONE-HALF TABLET BY MOUTH AT [facility] TIME 05/13/17   Lucille Passy, MD  aspirin EC 81 MG tablet Take 81 mg by mouth daily.    [provider]  clonazePAM (KLONOPIN) 1 MG tablet Take 1 tablet (1 mg total) by mouth 3 (three) times daily as needed. for anxiety 05/13/17   Lucille Passy, MD  dicyclomine (BENTYL) 10 MG capsule TAKE ONE CAPSULE BY MOUTH TWICE DAILY AS NEEDED FOR  CRAMPING  AND  ABDOMINAL  PAIN 10/05/16   Lucille Passy, MD  docusate sodium (COLACE) 100 MG capsule Take 100 mg by mouth at bedtime.    [provider]  famotidine (PEPCID) 20 MG tablet One at bedtime Patient taking differently: Take 20 mg by mouth at bedtime. One at bedtime 08/29/16   Tanda Rockers, MD  furosemide (LASIX) 40 MG tablet Take 1 tablet (40 mg total) by mouth daily. Patient taking differently: Take 40 mg by mouth at bedtime.  08/10/16 05/23/17  Lyda Jester M, PA-C  metFORMIN (GLUCOPHAGE) 500 MG tablet Take 1 tablet (500 mg total) by mouth daily with breakfast. 12/28/16   Lucille Passy, MD  omeprazole (PRILOSEC) 20 MG capsule Take 1 capsule (20 mg total) by mouth 2 (two) times daily. 09/20/16   Lucille Passy, MD  potassium chloride 20 MEQ TBCR Take 20 mEq by mouth daily. 05/24/17   Domenic Polite, MD  promethazine (PHENERGAN) 25 MG tablet Take 1 tablet (25 mg total) by mouth every 6 (six) hours as needed for nausea or  vomiting. 12/26/16   Lucille Passy, MD  rizatriptan (MAXALT-MLT) 10 MG disintegrating tablet Take 1 tablet (10 mg total) by mouth as needed for migraine. May repeat in 2 hours if needed. Do not take more than 3 tablets in a week. 03/14/16   Pleas Koch, NP  traZODone (DESYREL) 150 MG tablet Take 1 tablet (150 mg total) by mouth at bedtime. 05/13/17   Lucille Passy, MD  venlafaxine XR (EFFEXOR XR) 37.5 MG 24 hr capsule Take 1 capsule (37.5 mg total) by mouth daily. Increase to 2 capsules in 1 week 04/17/17 04/17/18  Aundra Dubin, MD  VENTOLIN HFA 108 [phone] Base) MCG/ACT inhaler Inhale 2 puffs into the lungs daily as needed for shortness of breath. 01/02/17   [provider]    Family History Family History  Problem Relation Age of Onset  . Diabetes Mother   . Hyperlipidemia Mother   . Hypertension Mother   . Anxiety disorder Mother   . Depression Mother   . Drug abuse Mother   . Prostate cancer Father   . Alcohol abuse Father   . Lung cancer Maternal Grandfather        smoked  . Emphysema Maternal Grandfather        smoked  . COPD Maternal Grandmother        never smoked   . Dementia Maternal Grandmother   . Thyroid cancer Paternal Aunt   . Colon cancer Neg Hx   . Rectal cancer Neg Hx   . Stomach cancer Neg Hx   . Migraines Neg Hx     Social History Social History  Substance Use Topics  . Smoking status: Current Every Day Smoker    Packs/day: 1.00    Years: 35.00    Types: Cigarettes, E-cigarettes  . Smokeless tobacco: Never Used     Comment: 04/02/17 cigarettes 1.5 PPD  . Alcohol use No     Allergies   Penicillins and Topamax [topiramate]   Review of Systems Review of Systems  Constitutional: Positive for diaphoresis. Negative for chills and fever.  Respiratory: Positive for shortness of breath. Negative for cough, hemoptysis and wheezing.   Cardiovascular: Positive for chest pain. Negative for orthopnea, claudication and leg swelling.    Gastrointestinal: Positive for nausea. Negative for abdominal pain, constipation, diarrhea and vomiting.  Genitourinary: Negative for dysuria and hematuria.  Musculoskeletal: Positive for back pain. Negative for arthralgias and myalgias.  Skin: Negative for color change.  Allergic/Immunologic: Positive for immunocompromised state (DM2).  Neurological: Positive for light-headedness. Negative for weakness and numbness.       +tingling around  mouth  Psychiatric/Behavioral: Negative for confusion.   All other systems reviewed and are negative for acute change except as noted in the HPI.    Physical Exam Updated Vital Signs BP 118/65 (BP Location: Right Arm)   Pulse 85   Temp 98 F (36.7 C) (Oral)   Resp 18   Ht 5\' 2"  (1.575 m)   Wt 71.2 kg (157 lb)   LMP 02/12/2014   SpO2 99%   BMI 28.72 kg/m   Physical Exam  Constitutional: She is oriented to person, place, and time. Vital signs are normal. She appears well-developed and well-nourished.  Non-toxic appearance. No distress.  Afebrile, nontoxic, NAD  HENT:  Head: Normocephalic and atraumatic.  Mouth/Throat: Oropharynx is clear and moist and mucous membranes are normal.  Eyes: Conjunctivae and EOM are normal. Right eye exhibits no discharge. Left eye exhibits no discharge.  Neck: Normal range of motion. Neck supple.  Cardiovascular: Normal rate, regular rhythm, normal heart sounds and intact distal pulses.  Exam reveals no gallop and no friction rub.   No murmur heard. RRR, nl s1/s2, no m/r/g, distal pulses intact, no pedal edema   Pulmonary/Chest: Effort normal and breath sounds normal. No respiratory distress. She has no decreased breath sounds. She has no wheezes. She has no rhonchi. She has no rales. She exhibits tenderness. She exhibits no crepitus, no deformity and no retraction.    CTAB in all lung fields, no w/r/r, no hypoxia or increased WOB, speaking in full sentences, SpO2 97% on RA Chest wall with mild anterior central  and L sided TTP, without crepitus, deformities, or retractions   Abdominal: Soft. Normal appearance and bowel sounds are normal. She exhibits no distension. There is tenderness in the epigastric area. There is no rigidity, no rebound, no guarding, no CVA tenderness, no tenderness at McBurney's point and negative Murphy's sign.    Soft, nondistended, +BS throughout, with mild epigastric TTP, no r/g/r, neg murphy's, neg mcburney's, no CVA TTP   Musculoskeletal: Normal range of motion.       Thoracic back: She exhibits tenderness. She exhibits normal range of motion and no bony tenderness.       Back:  MAE x4 Strength and sensation grossly intact in all extremities Distal pulses intact No pedal edema, neg homan's bilaterally  Thoracic back with mild L sided paraspinous muscle tenderness, no midline spinal tenderness, no bony stepoffs or deformities  Neurological: She is alert and oriented to person, place, and time. She has normal strength. No sensory deficit.  Skin: Skin is warm, dry and intact. No rash noted.  Psychiatric: Her mood appears anxious.  Anxious   Nursing note and vitals reviewed.    ED Treatments / Results  Labs (all labs ordered are listed, but only abnormal results are displayed) Labs Reviewed  BASIC METABOLIC PANEL - Abnormal; Notable for the following:       Result Value   Potassium 2.9 (*)    Chloride 95 (*)    Glucose, Bld 125 (*)    Anion gap 16 (*)    All other components within normal limits  CBC - Abnormal; Notable for the following:    Hemoglobin 15.2 (*)    Platelets 404 (*)    All other components within normal limits  HEPATIC FUNCTION PANEL  D-DIMER, QUANTITATIVE (NOT AT [facility])  Randolm Idol, ED  Randolm Idol, ED    EKG  EKG Interpretation  Date/Time:  Tuesday June 04 2017 17:47:24 EDT Ventricular Rate:  70 PR Interval:  QRS Duration: 98 QT Interval:  420 QTC Calculation: 454 R Axis:   31 Text Interpretation:  Sinus rhythm  Minimal ST depression, anterolateral leads When compared to prior, no significant changes.  No STEMI Confirmed by Antony Blackbird [phone]) on 06/04/2017 6:03:44 PM       Radiology Dg Chest 2 View  Result Date: 06/04/2017 CLINICAL DATA:  Mid and left-sided chest pain with jaw and left shoulder and upper arm pain associated with shortness of breath. Duration of symptoms 3 hours. Current smoker, history of bronchitis, hypertension, and hyperlipidemia. EXAM: CHEST  2 VIEW COMPARISON:  Chest x-ray of May 23, 2017 and November 09, 2015. Chest CT scan of July 26, 2016. FINDINGS: The lungs are adequately inflated. There are linear densities y in the right middle lobe and lingula. There is no alveolar infiltrate. There is no pleural effusion. The heart and pulmonary vascularity are normal. The mediastinum is normal in width. The bony thorax is unremarkable. IMPRESSION: No alveolar pneumonia nor CHF. Subsegmental atelectasis in the lingula and right middle lobe. Electronically Signed   By: David  Martinique M.D.   On: 06/04/2017 17:23    Echo 08/15/16: Study Conclusions - Left ventricle: The cavity size was normal. Wall thickness was   normal. Systolic function was normal. The estimated ejection   fraction was in the range of 60% to 65%. Wall motion was normal;   there were no regional wall motion abnormalities. Left   ventricular diastolic function parameters were normal. - Aortic valve: There was no stenosis. - Mitral valve: There was no significant regurgitation. - Right ventricle: The cavity size was normal. Systolic function   was normal. - Pulmonary arteries: No complete TR doppler jet so unable to   estimate PA systolic pressure. - Inferior vena cava: The vessel was normal in size. The   respirophasic diameter changes were in the normal range (>= 50%),   consistent with normal central venous pressure. Impressions: - Normal study.   Heart Cath 08/17/16: Conclusions 1. No angiographic evidence  of CAD 2. Normal LV systolic function 3. Normal LV filling pressures Recommendations: No further ischemic workup.    Procedures Procedures (including critical care time)  Medications Ordered in ED Medications  gi cocktail (Maalox,Lidocaine,Donnatal) (30 mLs Oral Given 06/04/17 1743)  aspirin chewable tablet 324 mg (324 mg Oral Given 06/04/17 1743)  famotidine (PEPCID) IVPB 20 mg premix (0 mg Intravenous Stopped 06/04/17 1814)  morphine 4 MG/ML injection 4 mg (4 mg Intravenous Given 06/04/17 1743)  ondansetron (ZOFRAN) injection 4 mg (4 mg Intravenous Given 06/04/17 1743)  potassium chloride SA (K-DUR,KLOR-CON) CR tablet 80 mEq (80 mEq Oral Given 06/04/17 1743)  sodium chloride 0.9 % bolus 1,000 mL (1,000 mLs Intravenous New Bag/Given 06/04/17 1743)  acetaminophen (TYLENOL) tablet 1,000 mg (1,000 mg Oral Given 06/04/17 1852)  LORazepam (ATIVAN) injection 1 mg (1 mg Intravenous Given 06/04/17 1852)     Initial Impression / Assessment and Plan / ED Course  I have reviewed the triage vital signs and the nursing notes.  Pertinent labs & imaging results that were available during my care of the patient were reviewed by me and considered in my medical decision making (see chart for details).     51 y.o. female here with L CP onset 8am when she woke up from rest, worsened at 2:30pm, radiates to L jaw/neck/arm and back. Also c/o SOB, nausea, lightheadedness, and diaphoresis. Was just admitted for ACS r/o on 5/31-6/1, had negative trops and was discharged; 10 months ago  she had unremarkable Echo and heart cath. On exam today, epigastric TTP, nonperitoneal; seems anxious; L chest wall TTP and tenderness to L thoracic paraspinous muscles; no tachycardia or hypoxia, pt well appearing, clear lungs, no LE swelling. Trop neg which is 8.5hrs after onset of symptoms, which is reassuring. CBC with mildly elevated Hgb 15.2, and plt 404, could be hemoconcentrated. BMP pending. EKG poor quality, will have them  recheck this now. Will add-on LFTs, and get D-dimer. Awaiting CXR as well. Will give zofran, morphine, GI cocktail, ASA, and pepcid, then reassess shortly.   5:05 PM BMP resulting, showing K 2.9 and marginally elevated anion gap; will give PO Kdur and IVFs. Pt on daily potassium per PCP notes from today and last visit 05/30/17, so shouldn't need any additional going home, will just give 77mEq now. Still awaiting remainder of work up, will reassess shortly  6:28 PM LFTs WNL. D-dimer negative. Repeat EKG better quality, shows minimal anterolateral ST depression which is similar to prior EKGs, and actually this EKG looks better than the one from the admission recently (T waves in anterolateral leads now upright, whereas before they were slightly inverted). CXR with subsegmental atelectasis in lingula and RML, which has actually been seen in prior CT chest from 07/2016. Pt feeling marginally better, but still complaining of pain; nausea resolved. Will give ativan and tylenol to see if we can help her symptoms, BP soft so will hold off on NTG at this point. Symptoms seem most consistent with gastritis/GERD vs anxiety. Will get second troponin to ensure no change, and then reassess after. Discussed case with my attending Dr. Sherry Ruffing who agrees with plan.   8:14 PM Second trop negative. Pt feeling better after tylenol and ativan, and tolerating PO well. Symptoms likely gastritis/GERD or anxiety vs musculoskeletal related. Will start on zantac, rx zofran given, discussed diet/lifestyle modifications for gastritis/GERD, and OTC remedies for symptomatic relief. Smoking cessation advised. F/up with PCP in 5-7 days for recheck of symptoms. I explained the diagnosis and have given explicit precautions to return to the ER including for any other new or worsening symptoms. The patient understands and accepts the medical plan as it's been dictated and I have answered their questions. Discharge instructions concerning home  care and prescriptions have been given. The patient is STABLE and is discharged to home in good condition.    Final Clinical Impressions(s) / ED Diagnoses   Final diagnoses:  Left sided chest pain  SOB (shortness of breath)  Nausea  Tobacco user  Anxiety  Epigastric abdominal tenderness without rebound tenderness  Gastritis, presence of bleeding unspecified, unspecified chronicity, unspecified gastritis type  Hypokalemia    New Prescriptions New Prescriptions   ONDANSETRON (ZOFRAN ODT) 4 MG DISINTEGRATING TABLET    Take 1 tablet (4 mg total) by mouth every 8 (eight) hours as needed for nausea or vomiting.   RANITIDINE (ZANTAC) 150 MG TABLET    Take 1 tablet (150 mg total) by mouth 2 (two) times daily.     [address] [place], [place] 06/04/17 2015    Tegeler, Gwenyth Allegra, MD 06/04/17 [phone]

## 2017-06-04 NOTE — ED Triage Notes (Signed)
Patient endorses left sided chest pain shoots up left neck and jaw and through to back. Patient states had faint chest pressure earlier today and around 2:30 pain became severe chest pressure with sharp stabbing pain. Patient states feels short of breath but has pain when taking a deep breath. Patient endorses nausea and lightheadedness.

## 2017-06-04 NOTE — Discharge Instructions (Signed)
Your symptoms are likely from gastritis or an ulcer, or could be related to other nonemergent causes such as gas pain, muscle pain, etc. Your potassium has been low, which we gave you while you were here, but see the list of foods below to help supplement your diet with potassium. You will need to take zantac as directed, and avoid spicy/fatty/acidic foods, avoid soda/coffee/tea/alcohol. Avoid laying down flat within 30 minutes of eating. Avoid NSAIDs like ibuprofen/aleve/motrin/etc on an empty stomach. May consider using over the counter tums/maalox as needed for additional relief. Use zofran as directed as needed for nausea. Use tylenol as needed for pain. May consider using heat over the areas of pain, using heat pack no more than 20 minutes per hour. STOP SMOKING! Follow up with your regular doctor in 5-7 days for recheck of symptoms. Return to the ER for changes or worsening symptoms.

## 2017-06-04 NOTE — Telephone Encounter (Signed)
Pt left v/m; pt having CP, lots of pressure in shoulder and jaw. I spoke with pt and her daughter is there with her; advised pt to call 911; pt said her daughter can get her to the [facility] ED faster than waiting on EMS. Pt will go [facility] to [facility] ED. I called [facility] spoke with Almyra Free, RN to notify pt is on her way. FYI to Dr Deborra Medina.  I also spoke with Barbera Setters at front desk as Juluis Rainier.

## 2017-06-04 NOTE — Telephone Encounter (Signed)
Patient called back to find out what she's suppose to do about her potassium.

## 2017-06-04 NOTE — Telephone Encounter (Signed)
Pt was notified of instructions, pt verbalized understanding.   

## 2017-06-13 ENCOUNTER — Ambulatory Visit (HOSPITAL_COMMUNITY): Payer: Self-pay | Admitting: Licensed Clinical Social Worker

## 2017-06-19 ENCOUNTER — Other Ambulatory Visit: Payer: Self-pay | Admitting: Family Medicine

## 2017-06-19 DIAGNOSIS — E876 Hypokalemia: Secondary | ICD-10-CM

## 2017-06-20 ENCOUNTER — Ambulatory Visit (HOSPITAL_COMMUNITY): Payer: Self-pay | Admitting: Psychiatry

## 2017-06-23 ENCOUNTER — Encounter (HOSPITAL_COMMUNITY): Payer: Self-pay | Admitting: Emergency Medicine

## 2017-06-23 ENCOUNTER — Emergency Department (HOSPITAL_COMMUNITY)
Admission: EM | Admit: 2017-06-23 | Discharge: 2017-06-23 | Disposition: A | Discharge status: Home / Self Care | Payer: BLUE CROSS/BLUE SHIELD | Attending: Emergency Medicine | Admitting: Emergency Medicine

## 2017-06-23 ENCOUNTER — Emergency Department (HOSPITAL_COMMUNITY): Payer: BLUE CROSS/BLUE SHIELD

## 2017-06-23 DIAGNOSIS — Z7902 Long term (current) use of antithrombotics/antiplatelets: Secondary | ICD-10-CM | POA: Diagnosis not present

## 2017-06-23 DIAGNOSIS — R0789 Other chest pain: Secondary | ICD-10-CM | POA: Diagnosis present

## 2017-06-23 DIAGNOSIS — F17210 Nicotine dependence, cigarettes, uncomplicated: Secondary | ICD-10-CM | POA: Diagnosis not present

## 2017-06-23 DIAGNOSIS — E119 Type 2 diabetes mellitus without complications: Secondary | ICD-10-CM | POA: Insufficient documentation

## 2017-06-23 DIAGNOSIS — Z7982 Long term (current) use of aspirin: Secondary | ICD-10-CM | POA: Diagnosis not present

## 2017-06-23 DIAGNOSIS — Z880 Allergy status to penicillin: Secondary | ICD-10-CM | POA: Diagnosis not present

## 2017-06-23 DIAGNOSIS — Z7984 Long term (current) use of oral hypoglycemic drugs: Secondary | ICD-10-CM | POA: Insufficient documentation

## 2017-06-23 DIAGNOSIS — N179 Acute kidney failure, unspecified: Secondary | ICD-10-CM | POA: Diagnosis not present

## 2017-06-23 DIAGNOSIS — Z79899 Other long term (current) drug therapy: Secondary | ICD-10-CM | POA: Insufficient documentation

## 2017-06-23 LAB — BASIC METABOLIC PANEL
Anion gap: 12 (ref 5–15)
BUN: 11 mg/dL (ref 6–20)
CHLORIDE: 99 mmol/L — AB (ref 101–111)
CO2: 24 mmol/L (ref 22–32)
CREATININE: 1.09 mg/dL — AB (ref 0.44–1.00)
Calcium: 9.2 mg/dL (ref 8.9–10.3)
GFR calc Af Amer: >60 mL/min (ref >60)
GFR calc non Af Amer: 58 mL/min — ABNORMAL LOW (ref >60)
GLUCOSE: 235 mg/dL — AB (ref 65–99)
POTASSIUM: 3.6 mmol/L (ref 3.5–5.1)
SODIUM: 135 mmol/L (ref 135–145)

## 2017-06-23 LAB — CBC
HEMATOCRIT: 42.4 % (ref 36.0–46.0)
Hemoglobin: 14.5 g/dL (ref 12.0–15.0)
MCH: 31.7 pg (ref 26.0–34.0)
MCHC: 34.2 g/dL (ref 30.0–36.0)
MCV: 92.8 fL (ref 78.0–100.0)
PLATELETS: 369 10*3/uL (ref 150–400)
RBC: 4.57 MIL/uL (ref 3.87–5.11)
RDW: 13.5 % (ref 11.5–15.5)
WBC: 10.4 10*3/uL (ref 4.0–10.5)

## 2017-06-23 LAB — I-STAT TROPONIN, ED: Troponin i, poc: 0 ng/mL (ref 0.00–0.08)

## 2017-06-23 LAB — D-DIMER, QUANTITATIVE: D-Dimer, Quant: <0.27 ug/mL-FEU (ref 0.00–0.50)

## 2017-06-23 MED ORDER — LORAZEPAM 2 MG/ML IJ SOLN
0.5000 mg | Freq: Once | INTRAMUSCULAR | Status: AC
  Administered 2017-06-23: 0.5 mg via INTRAVENOUS
  Filled 2017-06-23: qty 1

## 2017-06-23 MED ORDER — KETOROLAC TROMETHAMINE 30 MG/ML IJ SOLN
15.0000 mg | Freq: Once | INTRAMUSCULAR | Status: AC
  Administered 2017-06-23: 15 mg via INTRAVENOUS
  Filled 2017-06-23: qty 1

## 2017-06-23 MED ORDER — IBUPROFEN 600 MG PO TABS: 600.0000 mg | ORAL_TABLET | Freq: Four times a day (QID) | ORAL | 0 refills | Status: DC | PRN

## 2017-06-23 MED ORDER — METHOCARBAMOL 750 MG PO TABS: 750.0000 mg | ORAL_TABLET | Freq: Four times a day (QID) | ORAL | 0 refills | Status: DC

## 2017-06-23 MED ORDER — MORPHINE SULFATE (PF) 4 MG/ML IV SOLN
4.0000 mg | Freq: Once | INTRAVENOUS | Status: DC
  Filled 2017-06-23: qty 1

## 2017-06-23 MED ORDER — FENTANYL CITRATE (PF) 100 MCG/2ML IJ SOLN
50.0000 ug | Freq: Once | INTRAMUSCULAR | Status: AC
  Administered 2017-06-23: 50 ug via INTRAVENOUS
  Filled 2017-06-23: qty 2

## 2017-06-23 NOTE — ED Triage Notes (Signed)
Pt to ER for evaluation of left chest radiating to left arm sharp stabbing pain onset one hour prior to arrival to ER. Pt also reports nausea without vomiting and feeling like she has to take deep breaths. NAD at present.

## 2017-06-23 NOTE — ED Notes (Signed)
Per EDP, pt BP appropriate for medication administration.

## 2017-06-23 NOTE — ED Provider Notes (Signed)
[facility] DEPT Provider Note   CSN: [id number] Arrival date & time: 06/23/17  1709     History   Chief Complaint Chief Complaint  Patient presents with  . Chest Pain    HPI Ashley [name] is a 51 y.o. female.  51 year old female presents with several hours of sharp left-sided chest pain as worse with deep breathing as well as movement of her chest. Does note slight dyspnea but denies any diaphoresis. No recent cough or congestion. Denies any trauma or rashes to her chest. No prior history of same. Pain is better with remaining still. Denies any syncope or near-syncope.      Past Medical History:  Diagnosis Date  . Anxiety   . Bronchitis    uses inhaler if needed for bronchitis, lasted used -2014  . Cataract    bilateral  . Chronic headaches    migraines - in past, uses Phenergan for nausea   . Diabetes mellitus without complication ([place])    taken off Metformin since 04/2014, HgbA1C - normal, will follow up with PCP- Dr. Deborra Medina, 07/2014  . GERD (gastroesophageal reflux disease)   . H/O exercise stress test 2011   done at [facility]- told that it was WNL, done due to pt. having panic attacks   . History of blood transfusion 01/20/1966   at birth in [place], [place], unsure number of units  . Poor dentition    very poor oral health   . SVD (spontaneous vaginal delivery)    x 3  . Tobacco abuse     Patient Active Problem List   Diagnosis Date Noted  . Hypokalemia 05/23/2017  . Neck pain 05/23/2017  . Acute kidney injury ([place]) 05/23/2017  . Atypical chest pain   . Moderate episode of recurrent major depressive disorder ([place]) 05/16/2017  . Generalized weakness 04/25/2017  . Bilateral leg numbness 04/25/2017  . Diabetes mellitus with complication ([place])   . Right arm weakness   . 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 Paresthesia 03/04/2017  . Hospital discharge follow-up 02/19/2017  . Stroke-like symptom 02/19/2017  . Tobacco abuse 01/08/2017  . Diabetes ([place]) 12/26/2016    . Chronic bronchitis ([place]) 10/20/2015  . HLD (hyperlipidemia) 04/12/2015  . Migraine with aura, intractable 08/12/2014  . Chest pain 07/23/2014  . S/P hysterectomy with oophorectomy 04/15/2014  . Hyperglycemia 01/26/2013  . Migraine 05/30/2010  . Anxiety state 09/30/2009  . INSOMNIA, CHRONIC 09/30/2009  . ADJUSTMENT DISORDER WITH MIXED FEATURES 09/30/2009  . ELEVATED BLOOD PRESSURE WITHOUT DIAGNOSIS OF HYPERTENSION 09/30/2009    Past Surgical History:  Procedure Laterality Date  . ABDOMINAL HYSTERECTOMY    . APPENDECTOMY  1988  . CARDIAC CATHETERIZATION N/A 08/17/2016   Procedure: Left Heart Cath and Coronary Angiography;  Surgeon: Burnell Blanks, MD;  Location: [facility];  Service: Cardiovascular;  Laterality: N/A;  . CHOLECYSTECTOMY N/A 06/07/2014   Procedure: LAPAROSCOPIC CHOLECYSTECTOMY;  Surgeon: Ralene Ok, MD;  Location: [facility];  Service: General;  Laterality: N/A;  . CHOLECYSTECTOMY  June 07 2014  . COLONOSCOPY  02/16/2014   normal   . ESOPHAGOGASTRODUODENOSCOPY  12/28/2013  . KNEE ARTHROSCOPY  1995   left  . LAPAROSCOPIC ASSISTED VAGINAL HYSTERECTOMY N/A 04/15/2014   Procedure: LAPAROSCOPIC ASSISTED VAGINAL HYSTERECTOMY;  Surgeon: Marylynn Pearson, MD;  Location: [facility];  Service: Gynecology;  Laterality: N/A;  . LAPAROSCOPIC BILATERAL SALPINGO OOPHERECTOMY Bilateral 04/15/2014   Procedure: LAPAROSCOPIC BILATERAL SALPINGO OOPHORECTOMY;  Surgeon: Marylynn Pearson, MD;  Location: [facility];  Service: Gynecology;  Laterality: Bilateral;  . TUBAL LIGATION      OB History    No data available       Home Medications    Prior to Admission medications   Medication Sig Start Date End Date Taking? Authorizing Provider  amitriptyline (ELAVIL) 50 MG tablet TAKE ONE AND ONE-HALF TABLET BY MOUTH AT [facility] TIME 05/13/17   Lucille Passy, MD  aspirin EC 81 MG tablet Take 81 mg by mouth daily.    [provider]  clonazePAM (KLONOPIN) 1 MG tablet Take 1  tablet (1 mg total) by mouth 3 (three) times daily as needed. for anxiety 05/13/17   Lucille Passy, MD  dicyclomine (BENTYL) 10 MG capsule TAKE ONE CAPSULE BY MOUTH TWICE DAILY AS NEEDED FOR  CRAMPING  AND  ABDOMINAL  PAIN 10/05/16   Lucille Passy, MD  docusate sodium (COLACE) 100 MG capsule Take 100 mg by mouth at bedtime.    [provider]  famotidine (PEPCID) 20 MG tablet One at bedtime Patient taking differently: Take 20 mg by mouth at bedtime. One at bedtime 08/29/16   Tanda Rockers, MD  furosemide (LASIX) 40 MG tablet Take 1 tablet (40 mg total) by mouth daily. Patient taking differently: Take 40 mg by mouth at bedtime.  08/10/16 05/23/17  Lyda Jester M, PA-C  metFORMIN (GLUCOPHAGE) 500 MG tablet Take 1 tablet (500 mg total) by mouth daily with breakfast. 12/28/16   Lucille Passy, MD  omeprazole (PRILOSEC) 20 MG capsule Take 1 capsule (20 mg total) by mouth 2 (two) times daily. 09/20/16   Lucille Passy, MD  ondansetron (ZOFRAN ODT) 4 MG disintegrating tablet Take 1 tablet (4 mg total) by mouth every 8 (eight) hours as needed for nausea or vomiting. 06/04/17   Street, [place], PA-C  potassium chloride 20 MEQ TBCR Take 20 mEq by mouth daily. 05/24/17   Domenic Polite, MD  promethazine (PHENERGAN) 25 MG tablet Take 1 tablet (25 mg total) by mouth every 6 (six) hours as needed for nausea or vomiting. 12/26/16   Lucille Passy, MD  ranitidine (ZANTAC) 150 MG tablet Take 1 tablet (150 mg total) by mouth 2 (two) times daily. 06/04/17   Street, [place], PA-C  rizatriptan (MAXALT-MLT) 10 MG disintegrating tablet Take 1 tablet (10 mg total) by mouth as needed for migraine. May repeat in 2 hours if needed. Do not take more than 3 tablets in a week. 03/14/16   Pleas Koch, NP  traZODone (DESYREL) 150 MG tablet Take 1 tablet (150 mg total) by mouth at bedtime. 05/13/17   Lucille Passy, MD  venlafaxine XR (EFFEXOR XR) 37.5 MG 24 hr capsule Take 1 capsule (37.5 mg total) by mouth daily. Increase to  2 capsules in 1 week 04/17/17 04/17/18  Aundra Dubin, MD  VENTOLIN HFA 108 [phone] Base) MCG/ACT inhaler Inhale 2 puffs into the lungs daily as needed for shortness of breath. 01/02/17   [provider]    Family History Family History  Problem Relation Age of Onset  . Diabetes Mother   . Hyperlipidemia Mother   . Hypertension Mother   . Anxiety disorder Mother   . Depression Mother   . Drug abuse Mother   . Prostate cancer Father   . Alcohol abuse Father   . Lung cancer Maternal Grandfather        smoked  . Emphysema Maternal Grandfather        smoked  . COPD Maternal Grandmother  never smoked   . Dementia Maternal Grandmother   . Thyroid cancer Paternal Aunt   . Colon cancer Neg Hx   . Rectal cancer Neg Hx   . Stomach cancer Neg Hx   . Migraines Neg Hx     Social History Social History  Substance Use Topics  . Smoking status: Current Every Day Smoker    Packs/day: 1.00    Years: 35.00    Types: Cigarettes, E-cigarettes  . Smokeless tobacco: Never Used     Comment: 04/02/17 cigarettes 1.5 PPD  . Alcohol use No     Allergies   Penicillins and Topamax [topiramate]   Review of Systems Review of Systems  All other systems reviewed and are negative.    Physical Exam Updated Vital Signs BP (!) 122/107 (BP Location: Left Arm)   Pulse 88   Temp 98.2 F (36.8 C) (Oral)   Resp 18   LMP 02/12/2014   SpO2 100%   Physical Exam  Constitutional: She is oriented to person, place, and time. She appears well-developed and well-nourished.  Non-toxic appearance. No distress.  HENT:  Head: Normocephalic and atraumatic.  Eyes: Conjunctivae, EOM and lids are normal. Pupils are equal, round, and reactive to light.  Neck: Normal range of motion. Neck supple. No tracheal deviation present. No thyroid mass present.  Cardiovascular: Normal rate, regular rhythm and normal heart sounds.  Exam reveals no gallop.   No murmur heard. Pulmonary/Chest: Effort normal  and breath sounds normal. No stridor. No respiratory distress. She has no decreased breath sounds. She has no wheezes. She has no rhonchi. She has no rales. She exhibits tenderness.    Abdominal: Soft. Normal appearance and bowel sounds are normal. She exhibits no distension. There is no tenderness. There is no rebound and no CVA tenderness.  Musculoskeletal: Normal range of motion. She exhibits no edema or tenderness.  Neurological: She is alert and oriented to person, place, and time. She has normal strength. No cranial nerve deficit or sensory deficit. GCS eye subscore is 4. GCS verbal subscore is 5. GCS motor subscore is 6.  Skin: Skin is warm and dry. No abrasion and no rash noted.  Psychiatric: She has a normal mood and affect. Her speech is normal and behavior is normal.  Nursing note and vitals reviewed.    ED Treatments / Results  Labs (all labs ordered are listed, but only abnormal results are displayed) Labs Reviewed  CBC  BASIC METABOLIC PANEL  D-DIMER, QUANTITATIVE (NOT AT [facility])  Randolm Idol, ED    EKG  EKG Interpretation  Date/Time:  Sunday June 23 2017 17:10:42 EDT Ventricular Rate:  87 PR Interval:  138 QRS Duration: 70 QT Interval:  374 QTC Calculation: 450 R Axis:   40 Text Interpretation:  Normal sinus rhythm Normal ECG No significant change since last tracing Confirmed by Lacretia Leigh ([id number]) on 06/23/2017 6:07:57 PM       Radiology Dg Chest 2 View  Result Date: 06/23/2017 CLINICAL DATA:  Chest pain EXAM: CHEST  2 VIEW COMPARISON:  06/04/2017 FINDINGS: Linear atelectasis or scarring in the lower lungs bilaterally. Heart is normal size. No acute airspace opacities or effusions. No acute bony abnormality. IMPRESSION: Linear atelectasis or scarring in the lower lobes bilaterally. No active disease. Electronically Signed   By: Rolm Baptise M.D.   On: 06/23/2017 17:48    Procedures Procedures (including critical care time)  Medications Ordered in  ED Medications  ketorolac (TORADOL) 30 MG/ML injection 15 mg (not administered)  LORazepam (ATIVAN) injection 0.5 mg (not administered)     Initial Impression / Assessment and Plan / ED Course  I have reviewed the triage vital signs and the nursing notes.  Pertinent labs & imaging results that were available during my care of the patient were reviewed by me and considered in my medical decision making (see chart for details).     Patient has reproducible left sided anterior chest wall pain. D-dimer negative. Cardiac enzymes are showing. Medicated here feels better. Will be discharged home with return precautions given  Final Clinical Impressions(s) / ED Diagnoses   Final diagnoses:  None    New Prescriptions New Prescriptions   No medications on file     Lacretia Leigh, MD 06/23/17 2113

## 2017-06-24 ENCOUNTER — Telehealth: Payer: Self-pay

## 2017-06-24 NOTE — Telephone Encounter (Signed)
PLEASE NOTE: All timestamps contained within this report are represented as [place] Standard Time. CONFIDENTIALTY NOTICE: This fax transmission is intended only for the addressee. It contains information that is legally privileged, confidential or otherwise protected from use or disclosure. If you are not the intended recipient, you are strictly prohibited from reviewing, disclosing, copying using or disseminating any of this information or taking any action in reliance on or regarding this information. If you have received this fax in error, please notify us immediately by telephone so that we can arrange for its return to [place]. Phone: [phone], Toll-Free: [phone], Fax: [phone] Page: 1 of 2 Call Id: [id number] [place] Patient Name: Ashley [name] Gender: Female DOB: [birth year] Age: 51 Y 23 M 10 D Return Phone Number: [phone] (Primary), [phone] (Secondary) Address: [place] City/State/Zip: [place] [address] Client [facility] Night - Client Client Site [place] Physician Arnette Norris - MD Who Is Calling Patient / Member / Family / Caregiver Call Type Triage / Clinical Relationship To Patient Self Return Phone Number [phone] (Primary) Chief Complaint CHEST PAIN (>=21 years) - pain, pressure, heaviness or tightness Reason for Call Symptomatic / Request for [place] states that she is having left side chest pain that started approx. an hour ago while shopping, pain radiated to her left arm and down her elbow, pain scale 9, rapid breathing Nurse Assessment Nurse: Joya Gaskins, RN, Vonna Kotyk Date/Time (Eastern Time): 06/23/2017 4:45:46 PM Confirm and document reason for call. If symptomatic, describe symptoms. ---Caller states that she is having left side chest pain that  started approx. an hour ago while shopping, pain radiated to her left arm and down her elbow, pain scale 9, rapid breathing. It is a stabbing pain right above the left breast and under it and through shoulder and down her elbow. Does the PT have any chronic conditions? (i.e. diabetes, asthma, etc.) ---Yes List chronic conditions. ---DM I Is the patient pregnant or possibly pregnant? (Ask all females between the ages of 8-55) ---No Guidelines Guideline Title Affirmed Question Chest Pain [1] Chest pain lasts > 5 minutes AND [2] age > 95 Disp. Time Eilene Ghazi Time) Disposition Final User 06/23/2017 4:48:52 PM Call EMS 911 Now Yes Joya Gaskins, RN, [facility] - ED Care Advice Given Per Guideline CALL EMS 911 NOW: Immediate medical attention is needed. You need to hang up and call 911 (or an ambulance). Psychologist, forensic Discretion: I'll call you back in a few minutes to be sure you were able to reach them.) CARE ADVICE given per Chest Pain (Adult) guideline. PLEASE NOTE: All timestamps contained within this report are represented as [place] Standard Time. CONFIDENTIALTY NOTICE: This fax transmission is intended only for the addressee. It contains information that is legally privileged, confidential or otherwise protected from use or disclosure. If you are not the intended recipient, you are strictly prohibited from reviewing, disclosing, copying using or disseminating any of this information or taking any action in reliance on or regarding this information. If you have received this fax in error, please notify us immediately by telephone so that we can arrange for its return to [place]. Phone: [phone], Toll-Free: [phone], Fax: [phone] Page: 2 of 2 Call Id: [id number] Comments User: Ellan Lambert, RN Date/Time Eilene Ghazi Time): 06/23/2017 4:48:46 PM Caller states that her husband will drive her to the ED. Refuses to call 911.

## 2017-06-24 NOTE — Telephone Encounter (Signed)
Per chart review tab pt seen [place] on 06/23/17.

## 2017-06-25 ENCOUNTER — Other Ambulatory Visit: Payer: BLUE CROSS/BLUE SHIELD

## 2017-06-25 ENCOUNTER — Other Ambulatory Visit: Payer: Self-pay

## 2017-06-25 MED ORDER — POTASSIUM CHLORIDE ER 20 MEQ PO TBCR: 20.0000 meq | EXTENDED_RELEASE_TABLET | Freq: Every day | ORAL | 0 refills | Status: DC

## 2017-06-25 NOTE — Telephone Encounter (Signed)
Pt left v/m that she was seen in ED one month ago and does not have K pill to take today. [facility] [place]. Pt seen in ED with labs on 06/23/17.Please advise. Pt seen 05/30/17.`

## 2017-06-25 NOTE — Telephone Encounter (Signed)
Patient returned Chan's call.  Patient can be reached at [phone].

## 2017-06-25 NOTE — Telephone Encounter (Signed)
Spoken to patient and notified her that potassium 20 meq was sent to the pharmacy.

## 2017-06-25 NOTE — Telephone Encounter (Signed)
Message left for patient to return my call.  

## 2017-07-01 ENCOUNTER — Ambulatory Visit (HOSPITAL_COMMUNITY): Payer: Self-pay | Admitting: Licensed Clinical Social Worker

## 2017-07-01 ENCOUNTER — Other Ambulatory Visit: Payer: Self-pay

## 2017-07-01 NOTE — Telephone Encounter (Signed)
Rx changed to twice daily. Okay to call in rx as entered below.

## 2017-07-01 NOTE — Telephone Encounter (Signed)
Patient calls and requests refill on her clonazepam to go to [facility] at [facility].  Last seen OV:  05/30/17 Last Refill:  Clonazepam 1 mg tid prn, #90 on 05/13/2017.   *note: at recent ov patient reported taking 1 pill bid prn, should r/x be altered?    Please advise if okay to refill pending r/x and I will phone in.     Thanks.

## 2017-07-02 MED ORDER — CLONAZEPAM 1 MG PO TABS: 1.0000 mg | ORAL_TABLET | Freq: Two times a day (BID) | ORAL | 0 refills | Status: DC

## 2017-07-02 NOTE — Telephone Encounter (Signed)
Left message on pharmacy voice mail with new RX:  Clonazepam 1mg , 1 po bid as needed for anxiety, #60, 0 refills.

## 2017-07-07 ENCOUNTER — Emergency Department (HOSPITAL_COMMUNITY)
Admission: EM | Admit: 2017-07-07 | Discharge: 2017-07-07 | Disposition: A | Discharge status: Home / Self Care | Payer: BLUE CROSS/BLUE SHIELD | Attending: Emergency Medicine | Admitting: Emergency Medicine

## 2017-07-07 ENCOUNTER — Encounter (HOSPITAL_COMMUNITY): Payer: Self-pay | Admitting: *Deleted

## 2017-07-07 ENCOUNTER — Emergency Department (HOSPITAL_COMMUNITY): Payer: BLUE CROSS/BLUE SHIELD

## 2017-07-07 DIAGNOSIS — Z7982 Long term (current) use of aspirin: Secondary | ICD-10-CM | POA: Insufficient documentation

## 2017-07-07 DIAGNOSIS — E785 Hyperlipidemia, unspecified: Secondary | ICD-10-CM | POA: Insufficient documentation

## 2017-07-07 DIAGNOSIS — Z79899 Other long term (current) drug therapy: Secondary | ICD-10-CM | POA: Insufficient documentation

## 2017-07-07 DIAGNOSIS — E1165 Type 2 diabetes mellitus with hyperglycemia: Secondary | ICD-10-CM | POA: Diagnosis not present

## 2017-07-07 DIAGNOSIS — E876 Hypokalemia: Secondary | ICD-10-CM | POA: Diagnosis not present

## 2017-07-07 DIAGNOSIS — R112 Nausea with vomiting, unspecified: Secondary | ICD-10-CM | POA: Insufficient documentation

## 2017-07-07 DIAGNOSIS — R42 Dizziness and giddiness: Secondary | ICD-10-CM | POA: Diagnosis present

## 2017-07-07 DIAGNOSIS — F17210 Nicotine dependence, cigarettes, uncomplicated: Secondary | ICD-10-CM | POA: Insufficient documentation

## 2017-07-07 DIAGNOSIS — R739 Hyperglycemia, unspecified: Secondary | ICD-10-CM

## 2017-07-07 LAB — COMPREHENSIVE METABOLIC PANEL
ALK PHOS: 129 U/L — AB (ref 38–126)
ALT: 21 U/L (ref 14–54)
ANION GAP: 12 (ref 5–15)
AST: 21 U/L (ref 15–41)
Albumin: 3.7 g/dL (ref 3.5–5.0)
BILIRUBIN TOTAL: 0.6 mg/dL (ref 0.3–1.2)
BUN: 10 mg/dL (ref 6–20)
CALCIUM: 9 mg/dL (ref 8.9–10.3)
CO2: 27 mmol/L (ref 22–32)
Chloride: 91 mmol/L — ABNORMAL LOW (ref 101–111)
Creatinine, Ser: 0.9 mg/dL (ref 0.44–1.00)
GFR calc non Af Amer: >60 mL/min (ref >60)
Glucose, Bld: 176 mg/dL — ABNORMAL HIGH (ref 65–99)
POTASSIUM: 3 mmol/L — AB (ref 3.5–5.1)
SODIUM: 130 mmol/L — AB (ref 135–145)
TOTAL PROTEIN: 7 g/dL (ref 6.5–8.1)

## 2017-07-07 LAB — URINALYSIS, ROUTINE W REFLEX MICROSCOPIC
BILIRUBIN URINE: NEGATIVE
GLUCOSE, UA: NEGATIVE mg/dL
Hgb urine dipstick: NEGATIVE
KETONES UR: NEGATIVE mg/dL
Leukocytes, UA: NEGATIVE
NITRITE: NEGATIVE
PH: 6 (ref 5.0–8.0)
PROTEIN: NEGATIVE mg/dL
Specific Gravity, Urine: 1.003 — ABNORMAL LOW (ref 1.005–1.030)

## 2017-07-07 LAB — CBC
HEMATOCRIT: 41.7 % (ref 36.0–46.0)
Hemoglobin: 14.9 g/dL (ref 12.0–15.0)
MCH: 32.4 pg (ref 26.0–34.0)
MCHC: 35.7 g/dL (ref 30.0–36.0)
MCV: 90.7 fL (ref 78.0–100.0)
PLATELETS: 396 10*3/uL (ref 150–400)
RBC: 4.6 MIL/uL (ref 3.87–5.11)
RDW: 12.9 % (ref 11.5–15.5)
WBC: 11.4 10*3/uL — ABNORMAL HIGH (ref 4.0–10.5)

## 2017-07-07 LAB — CBG MONITORING, ED: Glucose-Capillary: 198 mg/dL — ABNORMAL HIGH (ref 65–99)

## 2017-07-07 MED ORDER — POTASSIUM CHLORIDE CRYS ER 20 MEQ PO TBCR
40.0000 meq | EXTENDED_RELEASE_TABLET | Freq: Once | ORAL | Status: AC
  Administered 2017-07-07: 40 meq via ORAL
  Filled 2017-07-07: qty 2

## 2017-07-07 MED ORDER — SODIUM CHLORIDE 0.9 % IV BOLUS (SEPSIS)
500.0000 mL | Freq: Once | INTRAVENOUS | Status: AC
  Administered 2017-07-07: 500 mL via INTRAVENOUS

## 2017-07-07 MED ORDER — SODIUM CHLORIDE 0.9 % IV BOLUS (SEPSIS)
1000.0000 mL | Freq: Once | INTRAVENOUS | Status: AC
  Administered 2017-07-07: 1000 mL via INTRAVENOUS

## 2017-07-07 NOTE — ED Provider Notes (Signed)
[facility] DEPT Provider Note   CSN: [id number] Arrival date & time: 07/07/17  1846     History   Chief Complaint Chief Complaint  Patient presents with  . Dizziness    HPI Ashley [name] is a 51 y.o. female.  Patient c/o high blood sugar, hx 'borderline diabetes', states feels as if is urinating a lot, feels generally weak, felt lightheaded earlier. Nausea and vomited a couple time earlier, not bloody or bilious.  Denies headache. No chest pain or sob. No abd pain. Is having normal stools. No dysuria. No fever or chills.    The history is provided by the patient.  Dizziness  Associated symptoms: shortness of breath   Associated symptoms: no chest pain and no headaches   Shortness of Breath  Pertinent negatives include no fever, no headaches, no sore throat, no neck pain, no chest pain, no abdominal pain and no rash.    Past Medical History:  Diagnosis Date  . Anxiety   . Bronchitis    uses inhaler if needed for bronchitis, lasted used -2014  . Cataract    bilateral  . Chronic headaches    migraines - in past, uses Phenergan for nausea   . Diabetes mellitus without complication ([place])    taken off Metformin since 04/2014, HgbA1C - normal, will follow up with PCP- Dr. Deborra Medina, 07/2014  . GERD (gastroesophageal reflux disease)   . H/O exercise stress test 2011   done at [facility]- told that it was WNL, done due to pt. having panic attacks   . History of blood transfusion 04/15/66   at birth in [place], [place], unsure number of units  . Poor dentition    very poor oral health   . SVD (spontaneous vaginal delivery)    x 3  . Tobacco abuse     Patient Active Problem List   Diagnosis Date Noted  . Hypokalemia 05/23/2017  . Neck pain 05/23/2017  . Acute kidney injury ([place]) 05/23/2017  . Atypical chest pain   . Moderate episode of recurrent major depressive disorder ([place]) 05/16/2017  . Generalized weakness 04/25/2017  . Bilateral leg numbness 04/25/2017  . Diabetes  mellitus with complication ([place])   . Right arm weakness   . 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 Paresthesia 03/04/2017  . Hospital discharge follow-up 02/19/2017  . Stroke-like symptom 02/19/2017  . Tobacco abuse 01/08/2017  . Diabetes ([place]) 12/26/2016  . Chronic bronchitis ([place]) 10/20/2015  . HLD (hyperlipidemia) 04/12/2015  . Migraine with aura, intractable 08/12/2014  . Chest pain 07/23/2014  . S/P hysterectomy with oophorectomy 04/15/2014  . Hyperglycemia 01/26/2013  . Migraine 05/30/2010  . Anxiety state 09/30/2009  . INSOMNIA, CHRONIC 09/30/2009  . ADJUSTMENT DISORDER WITH MIXED FEATURES 09/30/2009  . ELEVATED BLOOD PRESSURE WITHOUT DIAGNOSIS OF HYPERTENSION 09/30/2009    Past Surgical History:  Procedure Laterality Date  . ABDOMINAL HYSTERECTOMY    . APPENDECTOMY  1988  . CARDIAC CATHETERIZATION N/A 08/17/2016   Procedure: Left Heart Cath and Coronary Angiography;  Surgeon: Burnell Blanks, MD;  Location: [facility];  Service: Cardiovascular;  Laterality: N/A;  . CHOLECYSTECTOMY N/A 06/07/2014   Procedure: LAPAROSCOPIC CHOLECYSTECTOMY;  Surgeon: Ralene Ok, MD;  Location: [facility];  Service: General;  Laterality: N/A;  . CHOLECYSTECTOMY  June 07 2014  . COLONOSCOPY  02/16/2014   normal   . ESOPHAGOGASTRODUODENOSCOPY  12/28/2013  . KNEE ARTHROSCOPY  1995   left  . LAPAROSCOPIC ASSISTED VAGINAL HYSTERECTOMY N/A 04/15/2014   Procedure: LAPAROSCOPIC  ASSISTED VAGINAL HYSTERECTOMY;  Surgeon: Marylynn Pearson, MD;  Location: [facility];  Service: Gynecology;  Laterality: N/A;  . LAPAROSCOPIC BILATERAL SALPINGO OOPHERECTOMY Bilateral 04/15/2014   Procedure: LAPAROSCOPIC BILATERAL SALPINGO OOPHORECTOMY;  Surgeon: Marylynn Pearson, MD;  Location: [facility];  Service: Gynecology;  Laterality: Bilateral;  . TUBAL LIGATION      OB History    No data available       Home Medications    Prior to Admission medications   Medication Sig Start Date End Date Taking?  Authorizing Provider  albuterol (PROVENTIL HFA;VENTOLIN HFA) 108 (90 Base) MCG/ACT inhaler Inhale 2 puffs into the lungs every 6 (six) hours as needed for wheezing or shortness of breath.    [provider]  amitriptyline (ELAVIL) 50 MG tablet TAKE ONE AND ONE-HALF TABLET BY MOUTH AT DINNER TIME Patient taking differently: TAKE ONE AND ONE-HALF TABLET (75 MG) BY MOUTH AT BEDTIME 05/13/17   Lucille Passy, MD  aspirin EC 81 MG tablet Take 81 mg by mouth at bedtime.     [provider]  clonazePAM (KLONOPIN) 1 MG tablet Take 1 tablet (1 mg total) by mouth 2 (two) times daily. for anxiety 07/02/17   Lucille Passy, MD  dicyclomine (BENTYL) 10 MG capsule TAKE ONE CAPSULE BY MOUTH TWICE DAILY AS NEEDED FOR  CRAMPING  AND  ABDOMINAL  PAIN Patient taking differently: TAKE ONE CAPSULE (10 MG) BY MOUTH TWICE DAILY  FOR  CRAMPING  AND  ABDOMINAL  PAIN 10/05/16   Lucille Passy, MD  docusate sodium (COLACE) 100 MG capsule Take 100 mg by mouth at bedtime.    [provider]  famotidine (PEPCID) 20 MG tablet One at bedtime Patient taking differently: Take 20 mg by mouth at bedtime.  08/29/16   Tanda Rockers, MD  furosemide (LASIX) 40 MG tablet Take 40 mg by mouth daily. 06/15/17   [provider]  ibuprofen (ADVIL,MOTRIN) 600 MG tablet Take 1 tablet (600 mg total) by mouth every 6 (six) hours as needed. 06/23/17   Lacretia Leigh, MD  metFORMIN (GLUCOPHAGE) 500 MG tablet Take 1 tablet (500 mg total) by mouth daily with breakfast. 12/28/16   Lucille Passy, MD  methocarbamol (ROBAXIN-750) 750 MG tablet Take 1 tablet (750 mg total) by mouth 4 (four) times daily. 06/23/17   Lacretia Leigh, MD  omeprazole (PRILOSEC) 20 MG capsule Take 1 capsule (20 mg total) by mouth 2 (two) times daily. 09/20/16   Lucille Passy, MD  ondansetron (ZOFRAN ODT) 4 MG disintegrating tablet Take 1 tablet (4 mg total) by mouth every 8 (eight) hours as needed for nausea or vomiting. Patient not taking: Reported on  06/23/2017 06/04/17   Street, [place], PA-C  Potassium Chloride ER 20 MEQ TBCR Take 20 mEq by mouth daily. 06/25/17   Lucille Passy, MD  promethazine (PHENERGAN) 25 MG tablet Take 1 tablet (25 mg total) by mouth every 6 (six) hours as needed for nausea or vomiting. 12/26/16   Lucille Passy, MD  ranitidine (ZANTAC) 150 MG tablet Take 1 tablet (150 mg total) by mouth 2 (two) times daily. Patient not taking: Reported on 06/23/2017 06/04/17   Street, [place], PA-C  rizatriptan (MAXALT-MLT) 10 MG disintegrating tablet Take 1 tablet (10 mg total) by mouth as needed for migraine. May repeat in 2 hours if needed. Do not take more than 3 tablets in a week. Patient taking differently: Take 10 mg by mouth See admin instructions. Take 1 tablet (10 mg) by  mouth as needed for migraine, may repeat in 2 hours if needed. Do not take more than 3 tablets in a week. 03/14/16   Pleas Koch, NP  traZODone (DESYREL) 150 MG tablet Take 1 tablet (150 mg total) by mouth at bedtime. 05/13/17   Lucille Passy, MD  venlafaxine XR (EFFEXOR XR) 37.5 MG 24 hr capsule Take 1 capsule (37.5 mg total) by mouth daily. Increase to 2 capsules in 1 week Patient taking differently: Take 75 mg by mouth daily.  04/17/17 04/17/18  Aundra Dubin, MD    Family History Family History  Problem Relation Age of Onset  . Diabetes Mother   . Hyperlipidemia Mother   . Hypertension Mother   . Anxiety disorder Mother   . Depression Mother   . Drug abuse Mother   . Prostate cancer Father   . Alcohol abuse Father   . Lung cancer Maternal Grandfather        smoked  . Emphysema Maternal Grandfather        smoked  . COPD Maternal Grandmother        never smoked   . Dementia Maternal Grandmother   . Thyroid cancer Paternal Aunt   . Colon cancer Neg Hx   . Rectal cancer Neg Hx   . Stomach cancer Neg Hx   . Migraines Neg Hx     Social History Social History  Substance Use Topics  . Smoking status: Current Every Day Smoker    Packs/day:  1.50    Years: 35.00    Types: Cigarettes, E-cigarettes  . Smokeless tobacco: Never Used     Comment: 04/02/17 cigarettes 1.5 PPD  . Alcohol use No     Allergies   Penicillins and Topamax [topiramate]   Review of Systems Review of Systems  Constitutional: Negative for fever.  HENT: Negative for sore throat.   Eyes: Negative for redness.  Respiratory: Positive for shortness of breath.   Cardiovascular: Negative for chest pain.  Gastrointestinal: Negative for abdominal pain.  Endocrine: Positive for polyuria.  Genitourinary: Negative for dysuria and flank pain.  Musculoskeletal: Negative for neck pain.  Skin: Negative for rash.  Neurological: Positive for light-headedness. Negative for headaches.  Hematological: Does not bruise/bleed easily.  Psychiatric/Behavioral: Negative for confusion.     Physical Exam Updated Vital Signs BP 119/79 (BP Location: Right Arm)   Pulse 95   Temp 98.1 F (36.7 C) (Oral)   Resp 20   Ht 1.575 m (5\' 2" )   Wt 71.2 kg (157 lb)   LMP 02/12/2014   SpO2 97%   BMI 28.72 kg/m   Physical Exam  Constitutional: She appears well-developed and well-nourished. No distress.  HENT:  Head: Atraumatic.  Mouth/Throat: Oropharynx is clear and moist.  Eyes: Conjunctivae are normal. No scleral icterus.  Neck: Neck supple. No tracheal deviation present. No thyromegaly present.  Cardiovascular: Normal rate, regular rhythm, normal heart sounds and intact distal pulses.  Exam reveals no gallop and no friction rub.   No murmur heard. Pulmonary/Chest: Effort normal and breath sounds normal. No respiratory distress.  Abdominal: Soft. Normal appearance and bowel sounds are normal. She exhibits no distension. There is no tenderness.  Genitourinary:  Genitourinary Comments: No cva tenderness  Musculoskeletal: She exhibits no edema.  Neurological: She is alert.  Skin: Skin is warm and dry. No rash noted. She is not diaphoretic.  Psychiatric: She has a normal  mood and affect.  Nursing note and vitals reviewed.    ED Treatments /  Results  Labs (all labs ordered are listed, but only abnormal results are displayed) Results for orders placed or performed during the hospital encounter of 07/07/17  CBC  Result Value Ref Range   WBC 11.4 (H) 4.0 - 10.5 K/uL   RBC 4.60 3.87 - 5.11 MIL/uL   Hemoglobin 14.9 12.0 - 15.0 g/dL   HCT 41.7 36.0 - 46.0 %   MCV 90.7 78.0 - 100.0 fL   MCH 32.4 26.0 - 34.0 pg   MCHC 35.7 30.0 - 36.0 g/dL   RDW 12.9 11.5 - 15.5 %   Platelets 396 150 - 400 K/uL  Urinalysis, Routine w reflex microscopic  Result Value Ref Range   Color, Urine STRAW (A) YELLOW   APPearance CLEAR CLEAR   Specific Gravity, Urine 1.003 (L) 1.005 - 1.030   pH 6.0 5.0 - 8.0   Glucose, UA NEGATIVE NEGATIVE mg/dL   Hgb urine dipstick NEGATIVE NEGATIVE   Bilirubin Urine NEGATIVE NEGATIVE   Ketones, ur NEGATIVE NEGATIVE mg/dL   Protein, ur NEGATIVE NEGATIVE mg/dL   Nitrite NEGATIVE NEGATIVE   Leukocytes, UA NEGATIVE NEGATIVE  Comprehensive metabolic panel  Result Value Ref Range   Sodium 130 (L) 135 - 145 mmol/L   Potassium 3.0 (L) 3.5 - 5.1 mmol/L   Chloride 91 (L) 101 - 111 mmol/L   CO2 27 22 - 32 mmol/L   Glucose, Bld 176 (H) 65 - 99 mg/dL   BUN 10 6 - 20 mg/dL   Creatinine, Ser 0.90 0.44 - 1.00 mg/dL   Calcium 9.0 8.9 - 10.3 mg/dL   Total Protein 7.0 6.5 - 8.1 g/dL   Albumin 3.7 3.5 - 5.0 g/dL   AST 21 15 - 41 U/L   ALT 21 14 - 54 U/L   Alkaline Phosphatase 129 (H) 38 - 126 U/L   Total Bilirubin 0.6 0.3 - 1.2 mg/dL   GFR calc non Af Amer >60 >60 mL/min   GFR calc Af Amer >60 >60 mL/min   Anion gap 12 5 - 15  CBG monitoring, ED  Result Value Ref Range   Glucose-Capillary 198 (H) 65 - 99 mg/dL   Dg Chest 2 View  Result Date: 07/07/2017 CLINICAL DATA:  Dyspnea on exertion EXAM: CHEST  2 VIEW COMPARISON:  06/23/2017 chest radiograph. FINDINGS: Stable cardiomediastinal silhouette with normal heart size. No pneumothorax. No pleural  effusion. Stable mild scarring in the anterior mid lungs bilaterally. No pulmonary edema. No acute consolidative airspace disease. IMPRESSION: No active cardiopulmonary disease. Stable mild anterior bilateral mid lung scarring. Electronically Signed   By: Ilona Sorrel M.D.   On: 07/07/2017 19:34   Dg Chest 2 View  Result Date: 06/23/2017 CLINICAL DATA:  Chest pain EXAM: CHEST  2 VIEW COMPARISON:  06/04/2017 FINDINGS: Linear atelectasis or scarring in the lower lungs bilaterally. Heart is normal size. No acute airspace opacities or effusions. No acute bony abnormality. IMPRESSION: Linear atelectasis or scarring in the lower lobes bilaterally. No active disease. Electronically Signed   By: Rolm Baptise M.D.   On: 06/23/2017 17:48    EKG  EKG Interpretation None       Radiology No results found.  Procedures Procedures (including critical care time)  Medications Ordered in ED Medications - No data to display   Initial Impression / Assessment and Plan / ED Course  I have reviewed the triage vital signs and the nursing notes.  Pertinent labs & imaging results that were available during my care of the patient were reviewed  by me and considered in my medical decision making (see chart for details).  1 liter ns bolus.   Labs.   Reviewed nursing notes and prior charts for additional history.   Additional ivf and po fluids.  kcl po.  Recheck pt feels improved.   No recurrent nv. abd soft nt. Afeb.  Patient currently appears stable for d/c.    Final Clinical Impressions(s) / ED Diagnoses   Final diagnoses:  None    New Prescriptions New Prescriptions   No medications on file     Lajean Saver, MD 07/07/17 2300

## 2017-07-07 NOTE — ED Triage Notes (Signed)
Pt states nausea, dizziness and high blood sugar readings (200's), since this am and sob with ambulation for several weeks.

## 2017-07-07 NOTE — Discharge Instructions (Signed)
It was our pleasure to provide your ER care today - we hope that you feel better.  Rest. Drink plenty of water.  Increase the dose of your metformin to 500 mg 2x/day with meal.  Follow diabetic diet. Monitor sugars regularly.   Also from today's labs, your potassium level is low - take one extra of your potassium supplement each day for the next 3 days, eat plenty of fruits and vegetables, and follow up with your doctor for recheck in coming week.  For blood sugars, follow up with your doctor this week - call office tomorrow AM to arrange appointment.   Return to ER if worse, new symptoms, fevers, new or severe pain, persistent vomiting, weak/faint, other concern.

## 2017-07-08 ENCOUNTER — Telehealth: Payer: Self-pay

## 2017-07-08 NOTE — Telephone Encounter (Signed)
PLEASE NOTE: All timestamps contained within this report are represented as [place] Standard Time. CONFIDENTIALTY NOTICE: This fax transmission is intended only for the addressee. It contains information that is legally privileged, confidential or otherwise protected from use or disclosure. If you are not the intended recipient, you are strictly prohibited from reviewing, disclosing, copying using or disseminating any of this information or taking any action in reliance on or regarding this information. If you have received this fax in error, please notify us immediately by telephone so that we can arrange for its return to [place]. Phone: [phone], Toll-Free: [phone], Fax: [phone] Page: 1 of 2 Call Id: [id number] [place] Patient Name: Ashley [name] Gender: Female DOB: [birth year] Age: 51 Y 81 M 24 D Return Phone Number: [phone] (Primary), [phone] (Secondary) Address: [address] City/State/Zip: [place] [address] Client [facility] - Client Client Site [facility] - Day Physician Arnette Norris - MD Who Is Calling Patient / Member / Family / Caregiver Call Type Triage / Clinical Relationship To Patient Self Return Phone Number [phone] (Primary) Chief Complaint Blood Sugar High Reason for Call Symptomatic / Request for [place] has pre-diabetes and blood sugar is 202. Take Metformin. Appointment Disposition EMR Appointment Not Necessary Info pasted into Epic No Nurse Assessment Nurse: Cherie Dark, RN, [facility] Date/Time (Eastern Time): 07/07/2017 5:42:20 PM Confirm and document reason for call. If symptomatic, describe symptoms. ---Caller states she is prediabetic and takes metformin. She hasn't eaten in 4 hours and has had 2 bottles of water. Feels lightheaded and dizzy when she is  walking. Does the PT have any chronic conditions? (i.e. diabetes, asthma, etc.) ---Yes List chronic conditions. ---prediabetes, Is the patient pregnant or possibly pregnant? (Ask all females between the ages of 65-55) ---No Guidelines Guideline Title Affirmed Question Diabetes - High Blood Sugar [1] Vomiting AND [2] signs of dehydration (e.g., very dry mouth, lightheaded, etc.) Disp. Time Eilene Ghazi Time) Disposition Final User 07/07/2017 5:47:12 PM Go to ED Now Yes Cherie Dark, RN, [facility] - ED Care Advice Given Per Guideline GO TO ED NOW: You need to be seen in the Emergency Department. Go to the ER at ___________ [facility] now. Drive carefully. NOTE TO TRIAGER - DRIVING: * Another adult should drive. CARE ADVICE given per Diabetes - High Blood Sugar (Adult) guideline. BRING MEDICINES: * Please bring a list of your current medicines when you go to the Emergency Department (ER). PLEASE NOTE: All timestamps contained within this report are represented as [place] Standard Time. CONFIDENTIALTY NOTICE: This fax transmission is intended only for the addressee. It contains information that is legally privileged, confidential or otherwise protected from use or disclosure. If you are not the intended recipient, you are strictly prohibited from reviewing, disclosing, copying using or disseminating any of this information or taking any action in reliance on or regarding this information. If you have received this fax in error, please notify us immediately by telephone so that we can arrange for its return to [place]. Phone: [phone], Toll-Free: [phone], Fax: [phone] Page: 2 of 2 Call Id: [id number]

## 2017-07-08 NOTE — Telephone Encounter (Signed)
Per chart review tab pt seen [place] on 07/07/17.

## 2017-07-09 ENCOUNTER — Ambulatory Visit (INDEPENDENT_AMBULATORY_CARE_PROVIDER_SITE_OTHER): Payer: BLUE CROSS/BLUE SHIELD | Admitting: Family Medicine

## 2017-07-09 ENCOUNTER — Encounter: Payer: Self-pay | Admitting: Family Medicine

## 2017-07-09 VITALS — BP 108/80 | HR 80 | Ht 62.0 in | Wt 159.5 lb

## 2017-07-09 DIAGNOSIS — E119 Type 2 diabetes mellitus without complications: Secondary | ICD-10-CM

## 2017-07-09 DIAGNOSIS — R42 Dizziness and giddiness: Secondary | ICD-10-CM | POA: Diagnosis not present

## 2017-07-09 DIAGNOSIS — E876 Hypokalemia: Secondary | ICD-10-CM

## 2017-07-09 LAB — BASIC METABOLIC PANEL
BUN: 8 mg/dL (ref 6–23)
CALCIUM: 9.8 mg/dL (ref 8.4–10.5)
CHLORIDE: 96 meq/L (ref 96–112)
CO2: 30 meq/L (ref 19–32)
CREATININE: 0.85 mg/dL (ref 0.40–1.20)
GFR: 74.8 mL/min (ref >60.00)
GLUCOSE: 171 mg/dL — AB (ref 70–99)
Potassium: 3.8 mEq/L (ref 3.5–5.1)
Sodium: 136 mEq/L (ref 135–145)

## 2017-07-09 NOTE — Assessment & Plan Note (Signed)
>  25 minutes spent in face to face time with patient, >50% spent in counselling or coordination of care discussing dizziness, hypokalemia, diabetes with Ashley [name]. She is very anxious about her blood sugar.  I advised her to continue taking her dose of Metformin now that she is hydrated. We will recheck her potassium today and she will continue to keep a FSBS log for me. The patient indicates understanding of these issues and agrees with the plan.

## 2017-07-09 NOTE — Progress Notes (Signed)
Pre visit review using our clinic review tool, if applicable. No additional management support is needed unless otherwise documented below in the visit note. 

## 2017-07-09 NOTE — Progress Notes (Signed)
Subjective:   Patient ID: Ashley [name], female    DOB: [birth year], 51 y.o.   MRN: [id number]  Ashley [name] is a pleasant 51 y.o. year old female who presents to clinic today with Hospitalization Follow-up and Hyperglycemia (checked it this morning fasting and it was 154, yesterday was 255 at 7pm after super then at 9pm it was 215)  on 07/09/2017  HPI:  Presented to ER on 07/07/17 with dizziness. Notes reviewed.  Several days of feeling weak, light headed and nauseated. Blood sugar has been running high. No other symptoms.  Labs, EKG unremarkable. CXR neg UA neg  Given 1 L IV bolus and oral fluids.   Dg Chest 2 View  Result Date: 07/07/2017 CLINICAL DATA:  Dyspnea on exertion EXAM: CHEST  2 VIEW COMPARISON:  06/23/2017 chest radiograph. FINDINGS: Stable cardiomediastinal silhouette with normal heart size. No pneumothorax. No pleural effusion. Stable mild scarring in the anterior mid lungs bilaterally. No pulmonary edema. No acute consolidative airspace disease. IMPRESSION: No active cardiopulmonary disease. Stable mild anterior bilateral mid lung scarring. Electronically Signed   By: Ilona Sorrel M.D.   On: 07/07/2017 19:34   Dg Chest 2 View  Result Date: 06/23/2017 CLINICAL DATA:  Chest pain EXAM: CHEST  2 VIEW COMPARISON:  06/04/2017 FINDINGS: Linear atelectasis or scarring in the lower lungs bilaterally. Heart is normal size. No acute airspace opacities or effusions. No acute bony abnormality. IMPRESSION: Linear atelectasis or scarring in the lower lobes bilaterally. No active disease. Electronically Signed   By: Rolm Baptise M.D.   On: 06/23/2017 17:48   Lab Results  Component Value Date   NA 130 (L) 07/07/2017   K 3.0 (L) 07/07/2017   CL 91 (L) 07/07/2017   CO2 27 07/07/2017   Given potassium as well.  She is very concerned about her blood sugar.  Feels like it has been going up despite taking Metformin 500 mg daily.  Lab Results  Component Value Date   HGBA1C 6.1 (H)  05/23/2017     Current Outpatient Prescriptions on File Prior to Visit  Medication Sig Dispense Refill  . albuterol (PROVENTIL HFA;VENTOLIN HFA) 108 (90 Base) MCG/ACT inhaler Inhale 2 puffs into the lungs every 6 (six) hours as needed for wheezing or shortness of breath.    Marland Kitchen amitriptyline (ELAVIL) 50 MG tablet TAKE ONE AND ONE-HALF TABLET BY MOUTH AT DINNER TIME (Patient taking differently: TAKE ONE AND ONE-HALF TABLET (75 MG) BY MOUTH AT BEDTIME) 45 tablet 3  . aspirin EC 81 MG tablet Take 81 mg by mouth at bedtime.     . clonazePAM (KLONOPIN) 1 MG tablet Take 1 tablet (1 mg total) by mouth 2 (two) times daily. for anxiety 60 tablet 0  . dicyclomine (BENTYL) 10 MG capsule TAKE ONE CAPSULE BY MOUTH TWICE DAILY AS NEEDED FOR  CRAMPING  AND  ABDOMINAL  PAIN (Patient taking differently: TAKE ONE CAPSULE (10 MG) BY MOUTH TWICE DAILY  FOR  CRAMPING  AND  ABDOMINAL  PAIN) 180 capsule 1  . docusate sodium (COLACE) 100 MG capsule Take 100 mg by mouth at bedtime.    . famotidine (PEPCID) 20 MG tablet One at bedtime (Patient taking differently: Take 20 mg by mouth at bedtime. ) 30 tablet 11  . furosemide (LASIX) 40 MG tablet Take 40 mg by mouth daily.  3  . ibuprofen (ADVIL,MOTRIN) 600 MG tablet Take 1 tablet (600 mg total) by mouth every 6 (six) hours as needed. (Patient taking differently: Take  600 mg by mouth every 6 (six) hours as needed for headache (pain). ) 30 tablet 0  . metFORMIN (GLUCOPHAGE) 500 MG tablet Take 1 tablet (500 mg total) by mouth daily with breakfast. 30 tablet 3  . methocarbamol (ROBAXIN-750) 750 MG tablet Take 1 tablet (750 mg total) by mouth 4 (four) times daily. (Patient taking differently: Take 750 mg by mouth 4 (four) times daily as needed (chest pain). ) 30 tablet 0  . omeprazole (PRILOSEC) 20 MG capsule Take 1 capsule (20 mg total) by mouth 2 (two) times daily. 180 capsule 1  . Potassium Chloride ER 20 MEQ TBCR Take 20 mEq by mouth daily. 30 tablet 0  . promethazine  (PHENERGAN) 25 MG tablet Take 1 tablet (25 mg total) by mouth every 6 (six) hours as needed for nausea or vomiting. 30 tablet 0  . rizatriptan (MAXALT-MLT) 10 MG disintegrating tablet Take 1 tablet (10 mg total) by mouth as needed for migraine. May repeat in 2 hours if needed. Do not take more than 3 tablets in a week. (Patient taking differently: Take 10 mg by mouth See admin instructions. Take 1 tablet (10 mg) by mouth as needed for migraine, may repeat in 2 hours if needed. Do not take more than 3 tablets in a week.) 10 tablet 3  . traZODone (DESYREL) 150 MG tablet Take 1 tablet (150 mg total) by mouth at bedtime. 90 tablet 0  . venlafaxine XR (EFFEXOR XR) 37.5 MG 24 hr capsule Take 1 capsule (37.5 mg total) by mouth daily. Increase to 2 capsules in 1 week (Patient taking differently: Take 75 mg by mouth daily. ) 90 capsule 1  . ondansetron (ZOFRAN ODT) 4 MG disintegrating tablet Take 1 tablet (4 mg total) by mouth every 8 (eight) hours as needed for nausea or vomiting. (Patient not taking: Reported on 07/09/2017) 15 tablet 0  . ranitidine (ZANTAC) 150 MG tablet Take 1 tablet (150 mg total) by mouth 2 (two) times daily. (Patient not taking: Reported on 07/09/2017) 30 tablet 0   No current facility-administered medications on file prior to visit.     Allergies  Allergen Reactions  . Penicillins Anaphylaxis    Throat swells, Has patient had a PCN reaction causing immediate rash, facial/tongue/throat swelling, SOB or lightheadedness with hypotension: Yes Has patient had a PCN reaction causing severe rash involving mucus membranes or skin necrosis: No Has patient had a PCN reaction that required hospitalization No Has patient had a PCN reaction occurring within the last 10 years: No If all of the above answers are "NO", then may proceed with Cephalosporin use.   . Topamax [Topiramate] Other (See Comments)    Hands and feet  go numb    Past Medical History:  Diagnosis Date  . Anxiety   .  Bronchitis    uses inhaler if needed for bronchitis, lasted used -2014  . Cataract    bilateral  . Chronic headaches    migraines - in past, uses Phenergan for nausea   . Diabetes mellitus without complication ([place])    taken off Metformin since 04/2014, HgbA1C - normal, will follow up with PCP- Dr. Deborra Medina, 07/2014  . GERD (gastroesophageal reflux disease)   . H/O exercise stress test 2011   done at [facility]- told that it was WNL, done due to pt. having panic attacks   . History of blood transfusion 09/02/1966   at birth in [place], [place], unsure number of units  . Poor dentition    very  poor oral health   . SVD (spontaneous vaginal delivery)    x 3  . Tobacco abuse     Past Surgical History:  Procedure Laterality Date  . ABDOMINAL HYSTERECTOMY    . APPENDECTOMY  1988  . CARDIAC CATHETERIZATION N/A 08/17/2016   Procedure: Left Heart Cath and Coronary Angiography;  Surgeon: Burnell Blanks, MD;  Location: [facility];  Service: Cardiovascular;  Laterality: N/A;  . CHOLECYSTECTOMY N/A 06/07/2014   Procedure: LAPAROSCOPIC CHOLECYSTECTOMY;  Surgeon: Ralene Ok, MD;  Location: [facility];  Service: General;  Laterality: N/A;  . CHOLECYSTECTOMY  June 07 2014  . COLONOSCOPY  02/16/2014   normal   . ESOPHAGOGASTRODUODENOSCOPY  12/28/2013  . KNEE ARTHROSCOPY  1995   left  . LAPAROSCOPIC ASSISTED VAGINAL HYSTERECTOMY N/A 04/15/2014   Procedure: LAPAROSCOPIC ASSISTED VAGINAL HYSTERECTOMY;  Surgeon: Marylynn Pearson, MD;  Location: [facility];  Service: Gynecology;  Laterality: N/A;  . LAPAROSCOPIC BILATERAL SALPINGO OOPHERECTOMY Bilateral 04/15/2014   Procedure: LAPAROSCOPIC BILATERAL SALPINGO OOPHORECTOMY;  Surgeon: Marylynn Pearson, MD;  Location: [facility];  Service: Gynecology;  Laterality: Bilateral;  . TUBAL LIGATION      Family History  Problem Relation Age of Onset  . Diabetes Mother   . Hyperlipidemia Mother   . Hypertension Mother   . Anxiety disorder Mother   . Depression  Mother   . Drug abuse Mother   . Prostate cancer Father   . Alcohol abuse Father   . Lung cancer Maternal Grandfather        smoked  . Emphysema Maternal Grandfather        smoked  . COPD Maternal Grandmother        never smoked   . Dementia Maternal Grandmother   . Thyroid cancer Paternal Aunt   . Colon cancer Neg Hx   . Rectal cancer Neg Hx   . Stomach cancer Neg Hx   . Migraines Neg Hx     Social History   Social History  . Marital status: Married    Spouse name: Alvester Chou  . Number of children: 6  . Years of education: 36   Occupational History  .       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 Smoking status: Current Every Day Smoker    Packs/day: 1.50    Years: 35.00    Types: Cigarettes, E-cigarettes  . Smokeless tobacco: Never Used     Comment: 04/02/17 cigarettes 1.5 PPD  . Alcohol use No  . Drug use: No  . Sexual activity: Yes    Partners: Male    Birth control/ protection: Surgical   Other Topics Concern  . Not on file   Social History Narrative   5 living children, one child is a crack cocaine addict who often breaks into their house to steal money. One child died of cerebral palsy.   Caffeine use: Dr Malachi Bonds (3 per day)   2 cups coffee per day   The PMH, PSH, Social History, Family History, Medications, and allergies have been reviewed in [facility], and have been updated if relevant.   Review of Systems  Constitutional: Negative.   Respiratory: Negative.   Musculoskeletal: Negative.   Neurological: Positive for dizziness. Negative for tremors, seizures, syncope, facial asymmetry, speech difficulty, weakness, light-headedness, numbness and headaches.  Psychiatric/Behavioral: Negative.   All other systems reviewed and are negative.      Objective:    BP 108/80   Pulse 80  Ht 5\' 2"  (1.575 m)   Wt 159 lb 8 oz (72.3 kg)   LMP 02/12/2014   SpO2 98%   BMI 29.17 kg/m    Physical Exam   General:   Well-developed,well-nourished,in no acute distress; alert,appropriate and cooperative throughout examination Head:  normocephalic and atraumatic.   Eyes:  vision grossly intact, PERRL Ears:  R ear normal and L ear normal externally, TMs clear bilaterally Nose:  no external deformity.   Mouth:  good dentition.   Lungs:  Normal respiratory effort, chest expands symmetrically. Lungs are clear to auscultation, no crackles or wheezes. Heart:  Normal rate and regular rhythm. S1 and S2 normal without gallop, murmur, click, rub or other extra sounds. Extremities:  No clubbing, cyanosis, edema, or deformity noted with normal full range of motion of all joints.   Neurologic:  alert & oriented X3 and gait normal.   Skin:  Intact without suspicious lesions or rashes Psych:  Cognition and judgment appear intact. Alert and cooperative with normal attention span and concentration. No apparent delusions, illusions, hallucinations        Assessment & Plan:   Dizziness  Type 2 diabetes mellitus without complication, without long-term current use of insulin (HCC)  Hypokalemia - Plan: Basic metabolic panel No Follow-up on file.

## 2017-07-17 ENCOUNTER — Encounter: Payer: Self-pay | Admitting: Family Medicine

## 2017-07-17 ENCOUNTER — Ambulatory Visit (INDEPENDENT_AMBULATORY_CARE_PROVIDER_SITE_OTHER): Payer: BLUE CROSS/BLUE SHIELD | Admitting: Family Medicine

## 2017-07-17 VITALS — BP 110/78 | HR 81 | Ht 62.0 in | Wt 159.0 lb

## 2017-07-17 DIAGNOSIS — G43901 Migraine, unspecified, not intractable, with status migrainosus: Secondary | ICD-10-CM | POA: Diagnosis not present

## 2017-07-17 DIAGNOSIS — G43119 Migraine with aura, intractable, without status migrainosus: Secondary | ICD-10-CM

## 2017-07-17 MED ORDER — PROMETHAZINE HCL 25 MG PO TABS: 25.0000 mg | ORAL_TABLET | Freq: Four times a day (QID) | ORAL | 0 refills | Status: DC | PRN

## 2017-07-17 MED ORDER — PROMETHAZINE HCL 25 MG/ML IJ SOLN
25.0000 mg | Freq: Once | INTRAMUSCULAR | Status: AC
  Administered 2017-07-17: 25 mg via INTRAMUSCULAR

## 2017-07-17 MED ORDER — KETOROLAC TROMETHAMINE 60 MG/2ML IM SOLN
60.0000 mg | Freq: Once | INTRAMUSCULAR | Status: AC
  Administered 2017-07-17: 60 mg via INTRAMUSCULAR

## 2017-07-17 MED ORDER — RIZATRIPTAN BENZOATE 10 MG PO TBDP: 10.0000 mg | ORAL_TABLET | ORAL | 3 refills | Status: DC | PRN

## 2017-07-17 MED ORDER — IBUPROFEN 600 MG PO TABS: 600.0000 mg | ORAL_TABLET | Freq: Four times a day (QID) | ORAL | 0 refills | Status: DC | PRN

## 2017-07-17 NOTE — Addendum Note (Signed)
Addended by: Mady Haagensen on: 07/17/2017 11:43 AM   Modules accepted: Orders

## 2017-07-17 NOTE — Progress Notes (Signed)
Subjective:   Patient ID: Ashley [name], female    DOB: 06-29-1966, 51 y.o.   MRN: [id number]  Ashley [name] is a pleasant 51 y.o. year old female who presents to clinic today with Migraine (since yesterday morning along with some nausea)  on 07/17/2017  HPI:  Migraines- has been followed by neurology for this.  Last saw Evlyn Courier, NP on 04/02/17. Note reviewed.  Plan as below:  PLAN; continue amitriptyline as ordered migraine preventive Continue Maxalt acutely Follow-up appointment for Botox in May  Review migraine triggers given list of foods and reviewed these eliminate one at a time it is problematic Continue follow-up with counselor and psychiatrist Follow-up with Dr. Jaynee Eagles as planned  Yesterday, woke up with severe migraine, associated with photophobia, nausea and vomiting.  Not relieved with maxalt. She is complaint with her rxs.  Does need refills on them. Current Outpatient Prescriptions on File Prior to Visit  Medication Sig Dispense Refill  . albuterol (PROVENTIL HFA;VENTOLIN HFA) 108 (90 Base) MCG/ACT inhaler Inhale 2 puffs into the lungs every 6 (six) hours as needed for wheezing or shortness of breath.    Marland Kitchen amitriptyline (ELAVIL) 50 MG tablet TAKE ONE AND ONE-HALF TABLET BY MOUTH AT DINNER TIME (Patient taking differently: TAKE ONE AND ONE-HALF TABLET (75 MG) BY MOUTH AT BEDTIME) 45 tablet 3  . aspirin EC 81 MG tablet Take 81 mg by mouth at bedtime.     . clonazePAM (KLONOPIN) 1 MG tablet Take 1 tablet (1 mg total) by mouth 2 (two) times daily. for anxiety 60 tablet 0  . dicyclomine (BENTYL) 10 MG capsule TAKE ONE CAPSULE BY MOUTH TWICE DAILY AS NEEDED FOR  CRAMPING  AND  ABDOMINAL  PAIN (Patient taking differently: TAKE ONE CAPSULE (10 MG) BY MOUTH TWICE DAILY  FOR  CRAMPING  AND  ABDOMINAL  PAIN) 180 capsule 1  . docusate sodium (COLACE) 100 MG capsule Take 100 mg by mouth at bedtime.    . famotidine (PEPCID) 20 MG tablet One at bedtime (Patient taking differently:  Take 20 mg by mouth at bedtime. ) 30 tablet 11  . furosemide (LASIX) 40 MG tablet Take 40 mg by mouth daily.  3  . metFORMIN (GLUCOPHAGE) 500 MG tablet Take 1 tablet (500 mg total) by mouth daily with breakfast. 30 tablet 3  . methocarbamol (ROBAXIN-750) 750 MG tablet Take 1 tablet (750 mg total) by mouth 4 (four) times daily. (Patient taking differently: Take 750 mg by mouth 4 (four) times daily as needed (chest pain). ) 30 tablet 0  . omeprazole (PRILOSEC) 20 MG capsule Take 1 capsule (20 mg total) by mouth 2 (two) times daily. 180 capsule 1  . ondansetron (ZOFRAN ODT) 4 MG disintegrating tablet Take 1 tablet (4 mg total) by mouth every 8 (eight) hours as needed for nausea or vomiting. 15 tablet 0  . Potassium Chloride ER 20 MEQ TBCR Take 20 mEq by mouth daily. 30 tablet 0  . ranitidine (ZANTAC) 150 MG tablet Take 1 tablet (150 mg total) by mouth 2 (two) times daily. 30 tablet 0  . traZODone (DESYREL) 150 MG tablet Take 1 tablet (150 mg total) by mouth at bedtime. 90 tablet 0  . venlafaxine XR (EFFEXOR XR) 37.5 MG 24 hr capsule Take 1 capsule (37.5 mg total) by mouth daily. Increase to 2 capsules in 1 week (Patient taking differently: Take 75 mg by mouth daily. ) 90 capsule 1   No current facility-administered medications on file prior to  visit.     Allergies  Allergen Reactions  . Penicillins Anaphylaxis    Throat swells, Has patient had a PCN reaction causing immediate rash, facial/tongue/throat swelling, SOB or lightheadedness with hypotension: Yes Has patient had a PCN reaction causing severe rash involving mucus membranes or skin necrosis: No Has patient had a PCN reaction that required hospitalization No Has patient had a PCN reaction occurring within the last 10 years: No If all of the above answers are "NO", then may proceed with Cephalosporin use.   . Topamax [Topiramate] Other (See Comments)    Hands and feet  go numb    Past Medical History:  Diagnosis Date  . Anxiety   .  Bronchitis    uses inhaler if needed for bronchitis, lasted used -2014  . Cataract    bilateral  . Chronic headaches    migraines - in past, uses Phenergan for nausea   . Diabetes mellitus without complication ([place])    taken off Metformin since 04/2014, HgbA1C - normal, will follow up with PCP- Dr. Deborra Medina, 07/2014  . GERD (gastroesophageal reflux disease)   . H/O exercise stress test 2011   done at [facility] told that it was WNL, done due to pt. having panic attacks   . History of blood transfusion 28-Jul-1966   at birth in [place], [place], unsure number of units  . Poor dentition    very poor oral health   . SVD (spontaneous vaginal delivery)    x 3  . Tobacco abuse     Past Surgical History:  Procedure Laterality Date  . ABDOMINAL HYSTERECTOMY    . APPENDECTOMY  1988  . CARDIAC CATHETERIZATION N/A 08/17/2016   Procedure: Left Heart Cath and Coronary Angiography;  Surgeon: Burnell Blanks, MD;  Location: [facility];  Service: Cardiovascular;  Laterality: N/A;  . CHOLECYSTECTOMY N/A 06/07/2014   Procedure: LAPAROSCOPIC CHOLECYSTECTOMY;  Surgeon: Ralene Ok, MD;  Location: [facility];  Service: General;  Laterality: N/A;  . CHOLECYSTECTOMY  June 07 2014  . COLONOSCOPY  02/16/2014   normal   . ESOPHAGOGASTRODUODENOSCOPY  12/28/2013  . KNEE ARTHROSCOPY  1995   left  . LAPAROSCOPIC ASSISTED VAGINAL HYSTERECTOMY N/A 04/15/2014   Procedure: LAPAROSCOPIC ASSISTED VAGINAL HYSTERECTOMY;  Surgeon: Marylynn Pearson, MD;  Location: [facility];  Service: Gynecology;  Laterality: N/A;  . LAPAROSCOPIC BILATERAL SALPINGO OOPHERECTOMY Bilateral 04/15/2014   Procedure: LAPAROSCOPIC BILATERAL SALPINGO OOPHORECTOMY;  Surgeon: Marylynn Pearson, MD;  Location: [facility];  Service: Gynecology;  Laterality: Bilateral;  . TUBAL LIGATION      Family History  Problem Relation Age of Onset  . Diabetes Mother   . Hyperlipidemia Mother   . Hypertension Mother   . Anxiety disorder Mother   . Depression  Mother   . Drug abuse Mother   . Prostate cancer Father   . Alcohol abuse Father   . Lung cancer Maternal Grandfather        smoked  . Emphysema Maternal Grandfather        smoked  . COPD Maternal Grandmother        never smoked   . Dementia Maternal Grandmother   . Thyroid cancer Paternal Aunt   . Colon cancer Neg Hx   . Rectal cancer Neg Hx   . Stomach cancer Neg Hx   . Migraines Neg Hx     Social History   Social History  . Marital status: Married    Spouse name: Alvester Chou  . Number of children:  6  . Years of education: 68   Occupational History  .       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 Smoking status: Current Every Day Smoker    Packs/day: 1.50    Years: 35.00    Types: Cigarettes, E-cigarettes  . Smokeless tobacco: Never Used     Comment: 04/02/17 cigarettes 1.5 PPD  . Alcohol use No  . Drug use: No  . Sexual activity: Yes    Partners: Male    Birth control/ protection: Surgical   Other Topics Concern  . Not on file   Social History Narrative   5 living children, one child is a crack cocaine addict who often breaks into their house to steal money. One child died of cerebral palsy.   Caffeine use: Dr Malachi Bonds (3 per day)   2 cups coffee per day   The PMH, PSH, Social History, Family History, Medications, and allergies have been reviewed in [facility], and have been updated if relevant.   Review of Systems  Constitutional: Negative.   Eyes: Positive for photophobia. Negative for redness and visual disturbance.  Gastrointestinal: Positive for nausea and vomiting.  Neurological: Positive for headaches. Negative for dizziness, tremors, seizures, syncope, facial asymmetry, speech difficulty, weakness, light-headedness and numbness.  Psychiatric/Behavioral: Negative.   All other systems reviewed and are negative.      Objective:    BP 110/78   Pulse 81   Ht 5\' 2"  (1.575 m)   Wt 159 lb (72.1 kg)   LMP 02/12/2014   SpO2 99%   BMI 29.08  kg/m    Physical Exam  Constitutional: She is oriented to person, place, and time. She appears well-developed.  + photophobia, appears uncomfortable  HENT:  Head: Normocephalic and atraumatic.  Eyes: Conjunctivae are normal.  Cardiovascular: Normal rate.   Pulmonary/Chest: Effort normal.  Musculoskeletal: Normal range of motion.  Neurological: She is alert and oriented to person, place, and time. No cranial nerve deficit.  Skin: Skin is warm and dry.  Psychiatric: She has a normal mood and affect. Her behavior is normal. Judgment and thought content normal.          Assessment & Plan:   Intractable migraine with aura without status migrainosus  Migraine with status migrainosus, not intractable, unspecified migraine type - Plan: rizatriptan (MAXALT-MLT) 10 MG disintegrating tablet No Follow-up on file.

## 2017-07-17 NOTE — Assessment & Plan Note (Signed)
IM phenergan 25 mg , IM [place] 60 mg given to pt in office for acute intractable migraine. Continue maintenance rxs. eRx refills sent.

## 2017-07-18 ENCOUNTER — Other Ambulatory Visit: Payer: Self-pay

## 2017-07-18 ENCOUNTER — Telehealth: Payer: Self-pay

## 2017-07-18 NOTE — Telephone Encounter (Signed)
Called in.

## 2017-07-18 NOTE — Telephone Encounter (Signed)
Pt aware.

## 2017-07-18 NOTE — Telephone Encounter (Signed)
Pt left v/m; pt was seen 07/17/17; pt is going out of town this weekend and pt wants to know status of phenergan refill. Per med list under order class has phone in; was med phoned in to [facility]. Pt request cb.

## 2017-07-18 NOTE — Telephone Encounter (Signed)
er

## 2017-07-29 ENCOUNTER — Other Ambulatory Visit: Payer: Self-pay

## 2017-07-29 ENCOUNTER — Other Ambulatory Visit: Payer: Self-pay | Admitting: Family Medicine

## 2017-07-29 ENCOUNTER — Other Ambulatory Visit (HOSPITAL_COMMUNITY): Payer: Self-pay | Admitting: Psychiatry

## 2017-07-29 DIAGNOSIS — F331 Major depressive disorder, recurrent, moderate: Secondary | ICD-10-CM

## 2017-07-29 MED ORDER — POTASSIUM CHLORIDE ER 20 MEQ PO TBCR: 20.0000 meq | EXTENDED_RELEASE_TABLET | Freq: Every day | ORAL | 0 refills | Status: DC

## 2017-07-29 MED ORDER — CLONAZEPAM 1 MG PO TABS: 1.0000 mg | ORAL_TABLET | Freq: Two times a day (BID) | ORAL | 0 refills | Status: DC

## 2017-07-29 NOTE — Telephone Encounter (Signed)
Last refill 05/13/17  Last OV 07/17/17  Ok to refill?

## 2017-07-29 NOTE — Telephone Encounter (Signed)
Pt request refill clonazepam (last refilled # 60 on 07/02/17; pt has med to last until 08/02/17)  And Potassium (last refilled # 30 on 06/25/17. Pt wants to verify she is to continue taking the Potassium. Pt last seen 07/11/17 [facility] [place] [facility].

## 2017-07-29 NOTE — Telephone Encounter (Signed)
Called in to : [place], [place] - [address] [phone]

## 2017-08-05 ENCOUNTER — Telehealth: Payer: Self-pay | Admitting: Family Medicine

## 2017-08-05 DIAGNOSIS — E118 Type 2 diabetes mellitus with unspecified complications: Secondary | ICD-10-CM

## 2017-08-05 NOTE — Addendum Note (Signed)
Addended by: Ellamae Sia on: 08/05/2017 01:04 PM   Modules accepted: Orders

## 2017-08-05 NOTE — Telephone Encounter (Signed)
Patient said she was told to come in August to have her Hgb A1c checked. Patient would like to have her Potassium checked,also.  Patient wants to know if Dr.Aron wants her to come in for lab work and then  an office visit?

## 2017-08-05 NOTE — Telephone Encounter (Signed)
I just saw her so ok to just schedule a lab visit.  Orders entered.

## 2017-08-05 NOTE — Telephone Encounter (Signed)
Lab appts scheduled.

## 2017-08-06 ENCOUNTER — Other Ambulatory Visit (INDEPENDENT_AMBULATORY_CARE_PROVIDER_SITE_OTHER): Payer: BLUE CROSS/BLUE SHIELD

## 2017-08-06 DIAGNOSIS — E118 Type 2 diabetes mellitus with unspecified complications: Secondary | ICD-10-CM

## 2017-08-06 DIAGNOSIS — E876 Hypokalemia: Secondary | ICD-10-CM

## 2017-08-06 LAB — COMPREHENSIVE METABOLIC PANEL
ALK PHOS: 145 U/L — AB (ref 39–117)
ALT: 29 U/L (ref 0–35)
AST: 19 U/L (ref 0–37)
Albumin: 4.4 g/dL (ref 3.5–5.2)
BUN: 13 mg/dL (ref 6–23)
CALCIUM: 9.5 mg/dL (ref 8.4–10.5)
CO2: 34 mEq/L — ABNORMAL HIGH (ref 19–32)
Chloride: 92 mEq/L — ABNORMAL LOW (ref 96–112)
Creatinine, Ser: 0.78 mg/dL (ref 0.40–1.20)
GFR: 82.57 mL/min (ref >60.00)
GLUCOSE: 240 mg/dL — AB (ref 70–99)
POTASSIUM: 2.8 meq/L — AB (ref 3.5–5.1)
Sodium: 136 mEq/L (ref 135–145)
TOTAL PROTEIN: 7.2 g/dL (ref 6.0–8.3)
Total Bilirubin: 0.5 mg/dL (ref 0.2–1.2)

## 2017-08-06 LAB — HEMOGLOBIN A1C: Hgb A1c MFr Bld: 7.2 % — ABNORMAL HIGH (ref 4.6–6.5)

## 2017-08-09 ENCOUNTER — Other Ambulatory Visit (INDEPENDENT_AMBULATORY_CARE_PROVIDER_SITE_OTHER): Payer: BLUE CROSS/BLUE SHIELD

## 2017-08-09 DIAGNOSIS — E876 Hypokalemia: Secondary | ICD-10-CM

## 2017-08-09 LAB — POTASSIUM: Potassium: 3.9 meq/L (ref 3.5–5.1)

## 2017-08-12 ENCOUNTER — Encounter: Payer: Self-pay | Admitting: Family Medicine

## 2017-08-12 ENCOUNTER — Other Ambulatory Visit: Payer: Self-pay | Admitting: Family Medicine

## 2017-08-12 ENCOUNTER — Ambulatory Visit (INDEPENDENT_AMBULATORY_CARE_PROVIDER_SITE_OTHER): Payer: BLUE CROSS/BLUE SHIELD | Admitting: Family Medicine

## 2017-08-12 VITALS — BP 118/70 | HR 83 | Temp 98.1°F | Resp 16 | Ht 62.0 in | Wt 163.8 lb

## 2017-08-12 DIAGNOSIS — E876 Hypokalemia: Secondary | ICD-10-CM | POA: Diagnosis not present

## 2017-08-12 DIAGNOSIS — E118 Type 2 diabetes mellitus with unspecified complications: Secondary | ICD-10-CM | POA: Diagnosis not present

## 2017-08-12 LAB — BASIC METABOLIC PANEL
BUN: 12 mg/dL (ref 6–23)
CO2: 31 mEq/L (ref 19–32)
CREATININE: 0.73 mg/dL (ref 0.40–1.20)
Calcium: 9.3 mg/dL (ref 8.4–10.5)
Chloride: 98 mEq/L (ref 96–112)
GFR: 89.13 mL/min (ref >60.00)
GLUCOSE: 258 mg/dL — AB (ref 70–99)
Potassium: 3.7 mEq/L (ref 3.5–5.1)
Sodium: 137 mEq/L (ref 135–145)

## 2017-08-12 MED ORDER — METFORMIN HCL 500 MG PO TABS: 500.0000 mg | ORAL_TABLET | Freq: Two times a day (BID) | ORAL | 3 refills | Status: DC

## 2017-08-12 MED ORDER — TRAZODONE HCL 150 MG PO TABS: 150.0000 mg | ORAL_TABLET | Freq: Every day | ORAL | 3 refills | Status: DC

## 2017-08-12 NOTE — Assessment & Plan Note (Signed)
Deteriorated. Increase Metformin to 500 mg twice daily. Repeat a1c in 3 months. The patient indicates understanding of these issues and agrees with the plan.

## 2017-08-12 NOTE — Progress Notes (Signed)
Subjective:   Patient ID: Ashley [name], female    DOB: Sep 12, 1966, 51 y.o.   MRN: [id number]  Ashley [name] is a pleasant 51 y.o. year old female who presents to clinic today with Diabetes (LAB CHECK )  on 08/12/2017  HPI:  Diabetes- has deteriorated. Lab Results  Component Value Date   HGBA1C 7.2 (H) 08/06/2017   Currently taking Metformin 500 mg daily. Has felt like her sugars are going up.  Hypokalemia- currently taking potassium 40 meq daily as we advised, tomorrow returning to 20 meq daily.  Due for labs.  Lab Results  Component Value Date   NA 136 08/06/2017   K 3.9 08/09/2017   CL 92 (L) 08/06/2017   CO2 34 (H) 08/06/2017     Current Outpatient Prescriptions on File Prior to Visit  Medication Sig Dispense Refill  . albuterol (PROVENTIL HFA;VENTOLIN HFA) 108 (90 Base) MCG/ACT inhaler Inhale 2 puffs into the lungs every 6 (six) hours as needed for wheezing or shortness of breath.    Marland Kitchen amitriptyline (ELAVIL) 50 MG tablet TAKE ONE AND ONE-HALF TABLET BY MOUTH AT DINNER TIME (Patient taking differently: TAKE ONE AND ONE-HALF TABLET (75 MG) BY MOUTH AT BEDTIME) 45 tablet 3  . aspirin EC 81 MG tablet Take 81 mg by mouth at bedtime.     . clonazePAM (KLONOPIN) 1 MG tablet Take 1 tablet (1 mg total) by mouth 2 (two) times daily. for anxiety 60 tablet 0  . dicyclomine (BENTYL) 10 MG capsule TAKE ONE CAPSULE BY MOUTH TWICE DAILY AS NEEDED FOR  CRAMPING  AND  ABDOMINAL  PAIN (Patient taking differently: TAKE ONE CAPSULE (10 MG) BY MOUTH TWICE DAILY  FOR  CRAMPING  AND  ABDOMINAL  PAIN) 180 capsule 1  . docusate sodium (COLACE) 100 MG capsule Take 100 mg by mouth at bedtime.    . famotidine (PEPCID) 20 MG tablet One at bedtime (Patient taking differently: Take 20 mg by mouth at bedtime. ) 30 tablet 11  . furosemide (LASIX) 40 MG tablet Take 40 mg by mouth daily.  3  . ibuprofen (ADVIL,MOTRIN) 600 MG tablet Take 1 tablet (600 mg total) by mouth every 6 (six) hours as needed. 30 tablet 0   . methocarbamol (ROBAXIN-750) 750 MG tablet Take 1 tablet (750 mg total) by mouth 4 (four) times daily. (Patient taking differently: Take 750 mg by mouth 4 (four) times daily as needed (chest pain). ) 30 tablet 0  . omeprazole (PRILOSEC) 20 MG capsule Take 1 capsule (20 mg total) by mouth 2 (two) times daily. 180 capsule 1  . Potassium Chloride ER 20 MEQ TBCR Take 20 mEq by mouth daily. 30 tablet 0  . promethazine (PHENERGAN) 25 MG tablet Take 1 tablet (25 mg total) by mouth every 6 (six) hours as needed for nausea or vomiting. 30 tablet 0  . rizatriptan (MAXALT-MLT) 10 MG disintegrating tablet Take 1 tablet (10 mg total) by mouth as needed for migraine. May repeat in 2 hours if needed. Do not take more than 3 tablets in a week. 10 tablet 3  . traZODone (DESYREL) 150 MG tablet TAKE 1 TABLET BY MOUTH  DAILY AT BEDTIME 90 tablet 0  . venlafaxine XR (EFFEXOR-XR) 37.5 MG 24 hr capsule TAKE 1 CAPSULE BY MOUTH DAILY, INCREASE TO 2 CAPUSLES IN 1 WEEK 90 capsule 1   No current facility-administered medications on file prior to visit.     Allergies  Allergen Reactions  . Penicillins Anaphylaxis  Throat swells, Has patient had a PCN reaction causing immediate rash, facial/tongue/throat swelling, SOB or lightheadedness with hypotension: Yes Has patient had a PCN reaction causing severe rash involving mucus membranes or skin necrosis: No Has patient had a PCN reaction that required hospitalization No Has patient had a PCN reaction occurring within the last 10 years: No If all of the above answers are "NO", then may proceed with Cephalosporin use.   . Topamax [Topiramate] Other (See Comments)    Hands and feet  go numb    Past Medical History:  Diagnosis Date  . Anxiety   . Bronchitis    uses inhaler if needed for bronchitis, lasted used -2014  . Cataract    bilateral  . Chronic headaches    migraines - in past, uses Phenergan for nausea   . Diabetes mellitus without complication ([place])     taken off Metformin since 04/2014, HgbA1C - normal, will follow up with PCP- Dr. Deborra Medina, 07/2014  . GERD (gastroesophageal reflux disease)   . H/O exercise stress test 2011   done at [facility]- told that it was WNL, done due to pt. having panic attacks   . History of blood transfusion [birth year]66   at birth in [place], [place], unsure number of units  . Poor dentition    very poor oral health   . SVD (spontaneous vaginal delivery)    x 3  . Tobacco abuse     Past Surgical History:  Procedure Laterality Date  . ABDOMINAL HYSTERECTOMY    . APPENDECTOMY  1988  . CARDIAC CATHETERIZATION N/A 08/17/2016   Procedure: Left Heart Cath and Coronary Angiography;  Surgeon: Burnell Blanks, MD;  Location: [facility];  Service: Cardiovascular;  Laterality: N/A;  . CHOLECYSTECTOMY N/A 06/07/2014   Procedure: LAPAROSCOPIC CHOLECYSTECTOMY;  Surgeon: Ralene Ok, MD;  Location: [facility];  Service: General;  Laterality: N/A;  . CHOLECYSTECTOMY  June 07 2014  . COLONOSCOPY  02/16/2014   normal   . ESOPHAGOGASTRODUODENOSCOPY  12/28/2013  . KNEE ARTHROSCOPY  1995   left  . LAPAROSCOPIC ASSISTED VAGINAL HYSTERECTOMY N/A 04/15/2014   Procedure: LAPAROSCOPIC ASSISTED VAGINAL HYSTERECTOMY;  Surgeon: Marylynn Pearson, MD;  Location: [facility];  Service: Gynecology;  Laterality: N/A;  . LAPAROSCOPIC BILATERAL SALPINGO OOPHERECTOMY Bilateral 04/15/2014   Procedure: LAPAROSCOPIC BILATERAL SALPINGO OOPHORECTOMY;  Surgeon: Marylynn Pearson, MD;  Location: [facility];  Service: Gynecology;  Laterality: Bilateral;  . TUBAL LIGATION      Family History  Problem Relation Age of Onset  . Diabetes Mother   . Hyperlipidemia Mother   . Hypertension Mother   . Anxiety disorder Mother   . Depression Mother   . Drug abuse Mother   . Prostate cancer Father   . Alcohol abuse Father   . Lung cancer Maternal Grandfather        smoked  . Emphysema Maternal Grandfather        smoked  . COPD Maternal Grandmother         never smoked   . Dementia Maternal Grandmother   . Thyroid cancer Paternal Aunt   . Colon cancer Neg Hx   . Rectal cancer Neg Hx   . Stomach cancer Neg Hx   . Migraines Neg Hx     Social History   Social History  . Marital status: Married    Spouse name: Alvester Chou  . Number of children: 6  . Years of education: 73   Occupational History  .  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 Smoking status: Current Every Day Smoker    Packs/day: 1.50    Years: 35.00    Types: Cigarettes, E-cigarettes  . Smokeless tobacco: Never Used     Comment: 04/02/17 cigarettes 1.5 PPD  . Alcohol use No  . Drug use: No  . Sexual activity: Yes    Partners: Male    Birth control/ protection: Surgical   Other Topics Concern  . Not on file   Social History Narrative   5 living children, one child is a crack cocaine addict who often breaks into their house to steal money. One child died of cerebral palsy.   Caffeine use: Dr Malachi Bonds (3 per day)   2 cups coffee per day   The PMH, PSH, Social History, Family History, Medications, and allergies have been reviewed in [facility], and have been updated if relevant.   Review of Systems  Constitutional: Negative.   HENT: Negative.   Respiratory: Negative.   Cardiovascular: Negative.   Genitourinary: Negative.   Musculoskeletal: Negative.   Allergic/Immunologic: Negative.   Neurological: Negative.   Psychiatric/Behavioral: Negative.   All other systems reviewed and are negative.      Objective:    BP 118/70   Pulse 83   Temp 98.1 F (36.7 C) (Oral)   Resp 16   Ht 5\' 2"  (1.575 m)   Wt 163 lb 12.8 oz (74.3 kg)   LMP 02/12/2014   SpO2 98%   BMI 29.96 kg/m    Physical Exam  Constitutional: She is oriented to person, place, and time. She appears well-developed and well-nourished.  HENT:  Head: Normocephalic and atraumatic.  Eyes: Conjunctivae are normal.  Cardiovascular: Normal rate and regular rhythm.     Pulmonary/Chest: Effort normal and breath sounds normal.  Musculoskeletal: Normal range of motion. She exhibits no edema.  Neurological: She is alert and oriented to person, place, and time. No cranial nerve deficit.  Skin: Skin is warm and dry. She is not diaphoretic.  Psychiatric: She has a normal mood and affect. Her behavior is normal. Judgment and thought content normal.  Nursing note and vitals reviewed.         Assessment & Plan:   Diabetes mellitus with complication ([place]) - Plan: Basic metabolic panel  Hypokalemia No Follow-up on file.

## 2017-08-12 NOTE — Patient Instructions (Signed)
Great to see you.  We are increasing your metformin to 500 mg twice daily.  Please return in 3 months.

## 2017-08-12 NOTE — Assessment & Plan Note (Signed)
Repeat potassium today

## 2017-08-12 NOTE — Addendum Note (Signed)
Addended by: Lucille Passy on: 08/12/2017 09:54 AM   Modules accepted: Orders

## 2017-08-17 ENCOUNTER — Other Ambulatory Visit: Payer: Self-pay

## 2017-08-17 ENCOUNTER — Encounter (HOSPITAL_COMMUNITY): Payer: Self-pay

## 2017-08-17 ENCOUNTER — Emergency Department (HOSPITAL_COMMUNITY): Payer: BLUE CROSS/BLUE SHIELD

## 2017-08-17 ENCOUNTER — Emergency Department (HOSPITAL_COMMUNITY)
Admission: EM | Admit: 2017-08-17 | Discharge: 2017-08-17 | Disposition: A | Discharge status: Home / Self Care | Payer: BLUE CROSS/BLUE SHIELD | Attending: Emergency Medicine | Admitting: Emergency Medicine

## 2017-08-17 DIAGNOSIS — E1165 Type 2 diabetes mellitus with hyperglycemia: Secondary | ICD-10-CM | POA: Insufficient documentation

## 2017-08-17 DIAGNOSIS — Z7982 Long term (current) use of aspirin: Secondary | ICD-10-CM | POA: Diagnosis not present

## 2017-08-17 DIAGNOSIS — F17210 Nicotine dependence, cigarettes, uncomplicated: Secondary | ICD-10-CM | POA: Diagnosis not present

## 2017-08-17 DIAGNOSIS — R079 Chest pain, unspecified: Secondary | ICD-10-CM

## 2017-08-17 DIAGNOSIS — Z7984 Long term (current) use of oral hypoglycemic drugs: Secondary | ICD-10-CM | POA: Insufficient documentation

## 2017-08-17 DIAGNOSIS — Z79899 Other long term (current) drug therapy: Secondary | ICD-10-CM | POA: Diagnosis not present

## 2017-08-17 LAB — I-STAT TROPONIN, ED
Troponin i, poc: 0.01 ng/mL (ref 0.00–0.08)
Troponin i, poc: 0.01 ng/mL (ref 0.00–0.08)

## 2017-08-17 LAB — BASIC METABOLIC PANEL
Anion gap: 13 (ref 5–15)
BUN: 16 mg/dL (ref 6–20)
CHLORIDE: 96 mmol/L — AB (ref 101–111)
CO2: 25 mmol/L (ref 22–32)
CREATININE: 0.81 mg/dL (ref 0.44–1.00)
Calcium: 9.2 mg/dL (ref 8.9–10.3)
GFR calc Af Amer: >60 mL/min (ref >60)
GFR calc non Af Amer: >60 mL/min (ref >60)
GLUCOSE: 150 mg/dL — AB (ref 65–99)
POTASSIUM: 3.1 mmol/L — AB (ref 3.5–5.1)
SODIUM: 134 mmol/L — AB (ref 135–145)

## 2017-08-17 LAB — CBC
HEMATOCRIT: 40.5 % (ref 36.0–46.0)
Hemoglobin: 14.2 g/dL (ref 12.0–15.0)
MCH: 31.5 pg (ref 26.0–34.0)
MCHC: 35.1 g/dL (ref 30.0–36.0)
MCV: 89.8 fL (ref 78.0–100.0)
PLATELETS: 406 10*3/uL — AB (ref 150–400)
RBC: 4.51 MIL/uL (ref 3.87–5.11)
RDW: 13.8 % (ref 11.5–15.5)
WBC: 10.5 10*3/uL (ref 4.0–10.5)

## 2017-08-17 LAB — D-DIMER, QUANTITATIVE (NOT AT ARMC): D DIMER QUANT: 0.29 ug{FEU}/mL (ref 0.00–0.50)

## 2017-08-17 MED ORDER — ONDANSETRON 4 MG PO TBDP: 4.0000 mg | ORAL_TABLET | Freq: Three times a day (TID) | ORAL | 0 refills | Status: DC | PRN

## 2017-08-17 MED ORDER — HYDROCODONE-ACETAMINOPHEN 5-325 MG PO TABS: 1.0000 | ORAL_TABLET | ORAL | 0 refills | Status: DC | PRN

## 2017-08-17 MED ORDER — MORPHINE SULFATE (PF) 4 MG/ML IV SOLN
4.0000 mg | INTRAVENOUS | Status: DC | PRN
  Administered 2017-08-17: 4 mg via INTRAVENOUS
  Filled 2017-08-17: qty 1

## 2017-08-17 MED ORDER — PANTOPRAZOLE SODIUM 40 MG PO TBEC
40.0000 mg | DELAYED_RELEASE_TABLET | Freq: Every day | ORAL | Status: DC
  Administered 2017-08-17: 40 mg via ORAL
  Filled 2017-08-17: qty 1

## 2017-08-17 MED ORDER — PANTOPRAZOLE SODIUM 20 MG PO TBEC: 20.0000 mg | DELAYED_RELEASE_TABLET | Freq: Two times a day (BID) | ORAL | 0 refills | Status: DC

## 2017-08-17 MED ORDER — ONDANSETRON HCL 4 MG/2ML IJ SOLN
4.0000 mg | Freq: Once | INTRAMUSCULAR | Status: AC
  Administered 2017-08-17: 4 mg via INTRAVENOUS
  Filled 2017-08-17: qty 2

## 2017-08-17 MED ORDER — PANTOPRAZOLE SODIUM 40 MG IV SOLR
40.0000 mg | Freq: Once | INTRAVENOUS | Status: AC
  Administered 2017-08-17: 40 mg via INTRAVENOUS
  Filled 2017-08-17: qty 40

## 2017-08-17 NOTE — Discharge Instructions (Signed)
Testing tonight does not indicate heart attack or injury with your heart. Your symptoms may be related to irritation of her stomach or esophagus. Stop Pepcid and Prilosec. Begin Protonix twice per day. Call cardiologist office to both for outpatient testing. Return to ER with worsening symptoms.

## 2017-08-17 NOTE — ED Notes (Addendum)
Patient updated on delays, wait times, and explained POC and need to wait to be seen by MD.  Due to patient's abnormal labs will upgrade their acuity

## 2017-08-17 NOTE — ED Provider Notes (Signed)
[facility] DEPT Provider Note   CSN: [id number] Arrival date & time: 08/17/17  9323     History   Chief Complaint Chief Complaint  Patient presents with  . Chest Pain    HPI Ashley [name] is a 51 y.o. female.chief complaint is chest pain, and vomiting  HPI 51 year old female. No known cardiac disease. History of diabetes. No history of hypertension, high cholesterol, or family history. She does smoke.  Describes left-sided chest pain. States it is sharp. It is worse with cough but not deep breath. Started last night. She felt some nausea. Awakened this morning. He attempted to eat and exam which and had emesis. Her symptoms worsened. She does not have exertional dyspnea or pain. No tightness. Extensor under her left breast to her substernal area to her throat. No shoulder neck and jaw or back pain.    Past Medical History:  Diagnosis Date  . Anxiety   . Bronchitis    uses inhaler if needed for bronchitis, lasted used -2014  . Cataract    bilateral  . Chronic headaches    migraines - in past, uses Phenergan for nausea   . Diabetes mellitus without complication ([place])    taken off Metformin since 04/2014, HgbA1C - normal, will follow up with PCP- Dr. Deborra Medina, 07/2014  . GERD (gastroesophageal reflux disease)   . H/O exercise stress test 2011   done at [facility]- told that it was WNL, done due to pt. having panic attacks   . History of blood transfusion Dec 02, 1966   at birth in [place], [place], unsure number of units  . Poor dentition    very poor oral health   . SVD (spontaneous vaginal delivery)    x 3  . Tobacco abuse     Patient Active Problem List   Diagnosis Date Noted  . Dizziness 07/09/2017  . Hypokalemia 05/23/2017  . Moderate episode of recurrent major depressive disorder ([place]) 05/16/2017  . Generalized weakness 04/25/2017  . Bilateral leg numbness 04/25/2017  . Diabetes mellitus with complication ([place])   . 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 Paresthesia  03/04/2017  . Hospital discharge follow-up 02/19/2017  . Stroke-like symptom 02/19/2017  . Tobacco abuse 01/08/2017  . Diabetes ([place]) 12/26/2016  . Chronic bronchitis ([place]) 10/20/2015  . HLD (hyperlipidemia) 04/12/2015  . Migraine with aura, intractable 08/12/2014  . S/P hysterectomy with oophorectomy 04/15/2014  . Hyperglycemia 01/26/2013  . Migraine 05/30/2010  . Anxiety state 09/30/2009  . INSOMNIA, CHRONIC 09/30/2009  . ADJUSTMENT DISORDER WITH MIXED FEATURES 09/30/2009  . ELEVATED BLOOD PRESSURE WITHOUT DIAGNOSIS OF HYPERTENSION 09/30/2009    Past Surgical History:  Procedure Laterality Date  . ABDOMINAL HYSTERECTOMY    . APPENDECTOMY  1988  . CARDIAC CATHETERIZATION N/A 08/17/2016   Procedure: Left Heart Cath and Coronary Angiography;  Surgeon: Burnell Blanks, MD;  Location: [facility];  Service: Cardiovascular;  Laterality: N/A;  . CHOLECYSTECTOMY N/A 06/07/2014   Procedure: LAPAROSCOPIC CHOLECYSTECTOMY;  Surgeon: Ralene Ok, MD;  Location: [facility];  Service: General;  Laterality: N/A;  . CHOLECYSTECTOMY  June 07 2014  . COLONOSCOPY  02/16/2014   normal   . ESOPHAGOGASTRODUODENOSCOPY  12/28/2013  . KNEE ARTHROSCOPY  1995   left  . LAPAROSCOPIC ASSISTED VAGINAL HYSTERECTOMY N/A 04/15/2014   Procedure: LAPAROSCOPIC ASSISTED VAGINAL HYSTERECTOMY;  Surgeon: Marylynn Pearson, MD;  Location: [facility];  Service: Gynecology;  Laterality: N/A;  . LAPAROSCOPIC BILATERAL SALPINGO OOPHERECTOMY Bilateral 04/15/2014   Procedure: LAPAROSCOPIC BILATERAL SALPINGO OOPHORECTOMY;  Surgeon:  Marylynn Pearson, MD;  Location: [facility];  Service: Gynecology;  Laterality: Bilateral;  . TUBAL LIGATION      OB History    No data available       Home Medications    Prior to Admission medications   Medication Sig Start Date End Date Taking? Authorizing Provider  albuterol (PROVENTIL HFA;VENTOLIN HFA) 108 (90 Base) MCG/ACT inhaler Inhale 2 puffs into the lungs every 6 (six) hours as  needed for wheezing or shortness of breath.   Yes [provider]  amitriptyline (ELAVIL) 50 MG tablet TAKE ONE AND ONE-HALF TABLET BY MOUTH AT DINNER TIME Patient taking differently: TAKE ONE AND ONE-HALF TABLET (75 MG) BY MOUTH AT BEDTIME 05/13/17  Yes Lucille Passy, MD  aspirin EC 81 MG tablet Take 81 mg by mouth daily.    Yes [provider]  clonazePAM (KLONOPIN) 1 MG tablet Take 1 tablet (1 mg total) by mouth 2 (two) times daily. for anxiety 07/29/17  Yes Lucille Passy, MD  dicyclomine (BENTYL) 10 MG capsule TAKE ONE CAPSULE BY MOUTH TWICE DAILY AS NEEDED FOR  CRAMPING  AND  ABDOMINAL  PAIN Patient taking differently: TAKE ONE CAPSULE (10 MG) BY MOUTH TWICE DAILY  FOR  CRAMPING  AND  ABDOMINAL  PAIN 10/05/16  Yes Lucille Passy, MD  docusate sodium (COLACE) 100 MG capsule Take 100 mg by mouth at bedtime.   Yes [provider]  famotidine (PEPCID) 20 MG tablet One at bedtime Patient taking differently: Take 20 mg by mouth at bedtime.  08/29/16  Yes Tanda Rockers, MD  furosemide (LASIX) 40 MG tablet Take 40 mg by mouth daily. 06/15/17  Yes [provider]  ibuprofen (ADVIL,MOTRIN) 600 MG tablet Take 1 tablet (600 mg total) by mouth every 6 (six) hours as needed. 07/17/17  Yes Lucille Passy, MD  metFORMIN (GLUCOPHAGE) 500 MG tablet Take 1 tablet (500 mg total) by mouth 2 (two) times daily with a meal. 08/12/17  Yes Lucille Passy, MD  methocarbamol (ROBAXIN-750) 750 MG tablet Take 1 tablet (750 mg total) by mouth 4 (four) times daily. Patient taking differently: Take 750 mg by mouth 4 (four) times daily as needed (chest pain).  06/23/17  Yes Lacretia Leigh, MD  omeprazole (PRILOSEC) 20 MG capsule Take 1 capsule (20 mg total) by mouth 2 (two) times daily. 09/20/16  Yes Lucille Passy, MD  Potassium Chloride ER 20 MEQ TBCR Take 20 mEq by mouth daily. 07/29/17  Yes Lucille Passy, MD  promethazine (PHENERGAN) 25 MG tablet Take 1 tablet (25 mg total) by mouth every 6 (six) hours  as needed for nausea or vomiting. 07/17/17  Yes Lucille Passy, MD  rizatriptan (MAXALT-MLT) 10 MG disintegrating tablet Take 1 tablet (10 mg total) by mouth as needed for migraine. May repeat in 2 hours if needed. Do not take more than 3 tablets in a week. 07/17/17  Yes Lucille Passy, MD  traZODone (DESYREL) 150 MG tablet Take 1 tablet (150 mg total) by mouth at bedtime. 08/12/17  Yes Lucille Passy, MD  venlafaxine XR (EFFEXOR-XR) 37.5 MG 24 hr capsule TAKE 1 CAPSULE BY MOUTH DAILY, INCREASE TO 2 CAPUSLES IN 1 WEEK Patient taking differently: TAKE 75 MG  BY MOUTH DAILY 07/29/17  Yes Eksir, Richard Miu, MD  HYDROcodone-acetaminophen (NORCO/VICODIN) 5-325 MG tablet Take 1 tablet by mouth every 4 (four) hours as needed. 08/17/17   Tanna Furry, MD  ondansetron (ZOFRAN ODT) 4 MG disintegrating  tablet Take 1 tablet (4 mg total) by mouth every 8 (eight) hours as needed for nausea. 08/17/17   Tanna Furry, MD  pantoprazole (PROTONIX) 20 MG tablet Take 1 tablet (20 mg total) by mouth 2 (two) times daily. 08/17/17   Tanna Furry, MD    Family History Family History  Problem Relation Age of Onset  . Diabetes Mother   . Hyperlipidemia Mother   . Hypertension Mother   . Anxiety disorder Mother   . Depression Mother   . Drug abuse Mother   . Prostate cancer Father   . Alcohol abuse Father   . Lung cancer Maternal Grandfather        smoked  . Emphysema Maternal Grandfather        smoked  . COPD Maternal Grandmother        never smoked   . Dementia Maternal Grandmother   . Thyroid cancer Paternal Aunt   . Colon cancer Neg Hx   . Rectal cancer Neg Hx   . Stomach cancer Neg Hx   . Migraines Neg Hx     Social History Social History  Substance Use Topics  . Smoking status: Current Every Day Smoker    Packs/day: 2.00    Years: 35.00    Types: Cigarettes  . Smokeless tobacco: Never Used     Comment: 08-17-17- pt reports smokes "almost 2 packs a day"  . Alcohol use No     Allergies   Penicillins  and Topamax [topiramate]   Review of Systems Review of Systems  Constitutional: Negative for appetite change, chills, diaphoresis, fatigue and fever.  HENT: Negative for mouth sores, sore throat and trouble swallowing.   Eyes: Negative for visual disturbance.  Respiratory: Positive for shortness of breath. Negative for cough, chest tightness and wheezing.   Cardiovascular: Positive for chest pain.  Gastrointestinal: Positive for nausea. Negative for abdominal distention, abdominal pain, diarrhea and vomiting.  Endocrine: Negative for polydipsia, polyphagia and polyuria.  Genitourinary: Negative for dysuria, frequency and hematuria.  Musculoskeletal: Negative for gait problem.  Skin: Negative for color change, pallor and rash.  Neurological: Negative for dizziness, syncope, light-headedness and headaches.  Hematological: Does not bruise/bleed easily.  Psychiatric/Behavioral: Negative for behavioral problems and confusion.     Physical Exam Updated Vital Signs BP 115/74   Pulse 73   Temp 98.1 F (36.7 C) (Oral)   Resp (!) 22   LMP 02/12/2014   SpO2 99%   Physical Exam  Constitutional: She is oriented to person, place, and time. She appears well-developed and well-nourished. No distress.  HENT:  Head: Normocephalic.  Eyes: Pupils are equal, round, and reactive to light. Conjunctivae are normal. No scleral icterus.  Neck: Normal range of motion. Neck supple. No thyromegaly present.  Cardiovascular: Normal rate and regular rhythm.  Exam reveals no gallop and no friction rub.   No murmur heard. Pulmonary/Chest: Effort normal and breath sounds normal. No respiratory distress. She has no wheezes. She has no rales.  Abdominal: Soft. Bowel sounds are normal. She exhibits no distension. There is no tenderness. There is no rebound.  Musculoskeletal: Normal range of motion.  Neurological: She is alert and oriented to person, place, and time.  Skin: Skin is warm and dry. No rash noted.    Psychiatric: She has a normal mood and affect. Her behavior is normal.     ED Treatments / Results  Labs (all labs ordered are listed, but only abnormal results are displayed) Labs Reviewed  BASIC METABOLIC PANEL -  Abnormal; Notable for the following:       Result Value   Sodium 134 (*)    Potassium 3.1 (*)    Chloride 96 (*)    Glucose, Bld 150 (*)    All other components within normal limits  CBC - Abnormal; Notable for the following:    Platelets 406 (*)    All other components within normal limits  D-DIMER, QUANTITATIVE (NOT AT [facility])  I-STAT TROPONIN, ED  I-STAT TROPONIN, ED    EKG  EKG Interpretation  Date/Time:  Saturday August 17 2017 21:52:08 EDT Ventricular Rate:  72 PR Interval:    QRS Duration: 93 QT Interval:  414 QTC Calculation: 454 R Axis:   45 Text Interpretation:  Sinus rhythm Low voltage, precordial leads Borderline T abnormalities, anterior leads Confirmed by Tanna Furry [phone]) on 08/17/2017 10:08:08 PM       Radiology Dg Chest 2 View  Result Date: 08/17/2017 CLINICAL DATA:  Chest pain and shortness of Breath EXAM: CHEST  2 VIEW COMPARISON:  07/07/2017 FINDINGS: Cardiac shadow is within normal limits. The lungs are well aerated bilaterally with chronic basilar scarring. Nipple shadow is noted on the left. No infiltrate or sizable effusion is seen. No bony abnormality is noted. IMPRESSION: No active cardiopulmonary disease. Electronically Signed   By: Inez Catalina M.D.   On: 08/17/2017 18:31    Procedures Procedures (including critical care time)  Medications Ordered in ED Medications  morphine 4 MG/ML injection 4 mg (4 mg Intravenous Given 08/17/17 2228)  pantoprazole (PROTONIX) EC tablet 40 mg (not administered)  ondansetron (ZOFRAN) injection 4 mg (4 mg Intravenous Given 08/17/17 2228)  pantoprazole (PROTONIX) injection 40 mg (40 mg Intravenous Given 08/17/17 2228)     Initial Impression / Assessment and Plan / ED Course  I have reviewed  the triage vital signs and the nursing notes.  Pertinent labs & imaging results that were available during my care of the patient were reviewed by me and considered in my medical decision making (see chart for details).    EKG shows inverted T waves anteriorly which would present on prior EKGs. Initial troponin delta troponin at greater than 24 hours after symptom onset are negative. Chest x-ray shows no abnormalities. D-dimer negative. Given one dose IV morphine and her symptoms improve.Discussion heart score of 3. With greater than 24 hours of symptoms and negative troponin is I'm comfortable with her being discharged. I have asked her to take an aspirin daily and have cardiology follow-up for repeat outpatient testing as she does have risk factors. However, I believe the etiology of her pain today is GI in nature. She states at times she takes Prilosec, time she takes Pepcid. We'll prescribe Protonix twice a day. Return here with new or worsening symptoms  Final Clinical Impressions(s) / ED Diagnoses   Final diagnoses:  Chest pain, unspecified type    New Prescriptions New Prescriptions   HYDROCODONE-ACETAMINOPHEN (NORCO/VICODIN) 5-325 MG TABLET    Take 1 tablet by mouth every 4 (four) hours as needed.   ONDANSETRON (ZOFRAN ODT) 4 MG DISINTEGRATING TABLET    Take 1 tablet (4 mg total) by mouth every 8 (eight) hours as needed for nausea.   PANTOPRAZOLE (PROTONIX) 20 MG TABLET    Take 1 tablet (20 mg total) by mouth 2 (two) times daily.     Tanna Furry, MD 08/17/17 2312

## 2017-08-17 NOTE — ED Triage Notes (Addendum)
To triage via EMS.  Onset 10am chest pain, shortness of breath, vomiting x 1 after trying to eat egg sandwich.  Pain is underneath left breast, radiating upwards into neck, jaw, and back.; constant made worse with moving, standing.  No respiratory or swallowing difficulties.  EMS gave Aspirin 324 mg, NTG x 1- no change in pain.

## 2017-08-19 ENCOUNTER — Telehealth: Payer: Self-pay

## 2017-08-19 NOTE — Telephone Encounter (Signed)
Per chart review tab pt seen [facility] 08/17/17.

## 2017-08-19 NOTE — Telephone Encounter (Signed)
PLEASE NOTE: All timestamps contained within this report are represented as [place] Standard Time. CONFIDENTIALTY NOTICE: This fax transmission is intended only for the addressee. It contains information that is legally privileged, confidential or otherwise protected from use or disclosure. If you are not the intended recipient, you are strictly prohibited from reviewing, disclosing, copying using or disseminating any of this information or taking any action in reliance on or regarding this information. If you have received this fax in error, please notify us immediately by telephone so that we can arrange for its return to [place]. Phone: [phone], Toll-Free: [phone], Fax: [phone] Page: 1 of 1 Call Id: [id number] [place] Patient Name: Ashley [name] Gender: Female DOB: 22-Apr-1966 Age: 51 Y 52 M 4 D Return Phone Number: [phone] (Primary), [phone] (Secondary) Address: [place] City/State/Zip: [place] [address] Client [place] Night - Client Client Site [facility] Physician Arnette Norris - MD Who Is Calling Patient / Member / Family / Caregiver Call Type Triage / Clinical Relationship To Patient Self Return Phone Number [phone] (Primary) Chief Complaint CHEST PAIN (>=21 years) - pain, pressure, heaviness or tightness Reason for Call Symptomatic / Request for Cool is having chest pains, shortness of breath when speaking, dizzy when walking, her chest pain starts under her left breast, it goes around to her back into her Left shoulder, threw up her egg sandwhich earlier Nurse Assessment Nurse: Julien Girt, RN, Almyra Free Date/Time Eilene Ghazi Time): 08/17/2017 4:44:55 PM Confirm and document reason for call. If symptomatic, describe symptoms. ---Caller states she is having chest pains,  shortness of breath when speaking, feels dizzy when walking, Her chest pain starts under her left breast, it goes around to her back into her Left shoulder. Does the PT have any chronic conditions? (i.e. diabetes, asthma, etc.) ---Yes List chronic conditions. ---Chest pain eud, Diabetes, Migraine Is the patient pregnant or possibly pregnant? (Ask all females between the ages of 4-55) ---No Guidelines Guideline Title Affirmed Question Chest Pain SEVERE difficulty breathing (e.g., struggling for each breath, speaks in single words) Disp. Time Eilene Ghazi Time) Disposition Final User 08/17/2017 4:47:59 PM Call EMS 911 Now Yes Julien Girt, RN, [facility] Advice Given Per Guideline CALL EMS 911 NOW: Immediate medical attention is needed. You need to hang up and call 911 (or an ambulance). Psychologist, forensic Discretion: I'll call you back in a few minutes to be sure you were able to reach them.) CARE ADVICE given per Chest Pain (Adult) guideline.

## 2017-08-20 ENCOUNTER — Ambulatory Visit (INDEPENDENT_AMBULATORY_CARE_PROVIDER_SITE_OTHER): Payer: BLUE CROSS/BLUE SHIELD | Admitting: Neurology

## 2017-08-20 VITALS — BP 135/81 | HR 115

## 2017-08-20 DIAGNOSIS — G43711 Chronic migraine without aura, intractable, with status migrainosus: Secondary | ICD-10-CM

## 2017-08-20 NOTE — Progress Notes (Signed)
Botox-100unitsx2 vials Lot: [id number] Expiration: 02/2020 NDC: 8110-3159-45 [id number]  0.9% Sodium Chloride bacteriostatic- 23mL total Lot: [id number] Expiration: 05/24/2018 NDC: 5790-3833-38  Dx: V29.191 Specialty

## 2017-08-20 NOTE — Telephone Encounter (Signed)
Patient returned Araceli's call.  Patient can be reached at [phone].

## 2017-08-20 NOTE — Progress Notes (Signed)

## 2017-08-22 NOTE — Telephone Encounter (Signed)
Form for diabetic supplies left in Dr. Hulen Shouts in--basket for signature.

## 2017-08-22 NOTE — Telephone Encounter (Signed)
Aware, thanks!

## 2017-08-22 NOTE — Telephone Encounter (Signed)
Caller Name:Leslea Polsky Relationship to Patient:self Best number: Pharmacy:  Reason for call: pt called back - the form needs to have the diabetic meter, lancets, and strips included on it.

## 2017-08-27 ENCOUNTER — Other Ambulatory Visit (INDEPENDENT_AMBULATORY_CARE_PROVIDER_SITE_OTHER): Payer: BLUE CROSS/BLUE SHIELD

## 2017-08-27 ENCOUNTER — Other Ambulatory Visit: Payer: Self-pay | Admitting: Cardiology

## 2017-08-27 DIAGNOSIS — E876 Hypokalemia: Secondary | ICD-10-CM | POA: Diagnosis not present

## 2017-08-27 LAB — BASIC METABOLIC PANEL
BUN: 10 mg/dL (ref 6–23)
CHLORIDE: 107 meq/L (ref 96–112)
CO2: 26 meq/L (ref 19–32)
CREATININE: 0.74 mg/dL (ref 0.40–1.20)
Calcium: 9 mg/dL (ref 8.4–10.5)
GFR: 87.72 mL/min (ref >60.00)
Glucose, Bld: 144 mg/dL — ABNORMAL HIGH (ref 70–99)
Potassium: 4 mEq/L (ref 3.5–5.1)
Sodium: 142 mEq/L (ref 135–145)

## 2017-08-28 ENCOUNTER — Telehealth: Payer: Self-pay

## 2017-08-28 MED ORDER — POTASSIUM CHLORIDE ER 20 MEQ PO TBCR: 20.0000 meq | EXTENDED_RELEASE_TABLET | Freq: Every day | ORAL | 0 refills | Status: DC

## 2017-08-28 NOTE — Telephone Encounter (Signed)
Okay to refill both one time only, no refills.

## 2017-08-28 NOTE — Telephone Encounter (Signed)
Rx potassium was sent to pharmacy. Clonazepam was called into [facility] pharmacy.

## 2017-08-28 NOTE — Addendum Note (Signed)
Addended by: Valere Dross on: 08/28/2017 03:36 PM   Modules accepted: Orders

## 2017-08-28 NOTE — Telephone Encounter (Signed)
Patient came to facility requesting a refill on her Potassium and Clonazepam Rx. Last OV: 08/12/2017 Next OV: none Last Labs 08/12/2017 Last refilled Clonazepam 07/29/2017 and Potassium 07/29/2017

## 2017-08-30 ENCOUNTER — Ambulatory Visit: Payer: Self-pay | Admitting: Adult Health

## 2017-09-04 ENCOUNTER — Telehealth: Payer: Self-pay

## 2017-09-04 NOTE — Telephone Encounter (Signed)
Diabetic supply forms for Dr. Deborra Medina to fill out is placed on her desk. Patient insurance company is using Ashley [name] as her testing supply for her meter, strips, and lancets. Please fill out form to be given to them to process this request.

## 2017-09-04 NOTE — Telephone Encounter (Signed)
Waiting for patient to call back to [facility]. Patient is calling them to verify which kit and test strips need to be ordered. Awaiting call back from patient.

## 2017-09-09 ENCOUNTER — Telehealth: Payer: Self-pay | Admitting: Family Medicine

## 2017-09-09 ENCOUNTER — Emergency Department (HOSPITAL_COMMUNITY): Payer: BLUE CROSS/BLUE SHIELD

## 2017-09-09 ENCOUNTER — Encounter (HOSPITAL_COMMUNITY): Payer: Self-pay | Admitting: *Deleted

## 2017-09-09 ENCOUNTER — Emergency Department (HOSPITAL_COMMUNITY)
Admission: EM | Admit: 2017-09-09 | Discharge: 2017-09-09 | Disposition: A | Discharge status: Home / Self Care | Payer: BLUE CROSS/BLUE SHIELD | Attending: Emergency Medicine | Admitting: Emergency Medicine

## 2017-09-09 DIAGNOSIS — Z79899 Other long term (current) drug therapy: Secondary | ICD-10-CM | POA: Insufficient documentation

## 2017-09-09 DIAGNOSIS — K5900 Constipation, unspecified: Secondary | ICD-10-CM | POA: Insufficient documentation

## 2017-09-09 DIAGNOSIS — K310 Acute dilatation of stomach: Secondary | ICD-10-CM | POA: Insufficient documentation

## 2017-09-09 DIAGNOSIS — Z7984 Long term (current) use of oral hypoglycemic drugs: Secondary | ICD-10-CM | POA: Insufficient documentation

## 2017-09-09 DIAGNOSIS — Z7982 Long term (current) use of aspirin: Secondary | ICD-10-CM | POA: Insufficient documentation

## 2017-09-09 DIAGNOSIS — F17210 Nicotine dependence, cigarettes, uncomplicated: Secondary | ICD-10-CM | POA: Diagnosis not present

## 2017-09-09 DIAGNOSIS — E119 Type 2 diabetes mellitus without complications: Secondary | ICD-10-CM | POA: Diagnosis not present

## 2017-09-09 DIAGNOSIS — R300 Dysuria: Secondary | ICD-10-CM | POA: Diagnosis not present

## 2017-09-09 DIAGNOSIS — R1084 Generalized abdominal pain: Secondary | ICD-10-CM | POA: Diagnosis present

## 2017-09-09 DIAGNOSIS — N3000 Acute cystitis without hematuria: Secondary | ICD-10-CM | POA: Diagnosis not present

## 2017-09-09 DIAGNOSIS — Z880 Allergy status to penicillin: Secondary | ICD-10-CM | POA: Diagnosis not present

## 2017-09-09 LAB — I-STAT CG4 LACTIC ACID, ED
LACTIC ACID, VENOUS: 2.36 mmol/L — AB (ref 0.5–1.9)
Lactic Acid, Venous: 1.29 mmol/L (ref 0.5–1.9)

## 2017-09-09 LAB — URINALYSIS, ROUTINE W REFLEX MICROSCOPIC
Bilirubin Urine: NEGATIVE
Glucose, UA: NEGATIVE mg/dL
Hgb urine dipstick: NEGATIVE
Ketones, ur: NEGATIVE mg/dL
LEUKOCYTES UA: NEGATIVE
NITRITE: POSITIVE — AB
PH: 6 (ref 5.0–8.0)
Protein, ur: NEGATIVE mg/dL
SPECIFIC GRAVITY, URINE: 1.013 (ref 1.005–1.030)

## 2017-09-09 LAB — COMPREHENSIVE METABOLIC PANEL
ALK PHOS: 140 U/L — AB (ref 38–126)
ALT: 69 U/L — AB (ref 14–54)
AST: 55 U/L — AB (ref 15–41)
Albumin: 4.3 g/dL (ref 3.5–5.0)
Anion gap: 16 — ABNORMAL HIGH (ref 5–15)
BILIRUBIN TOTAL: 0.6 mg/dL (ref 0.3–1.2)
BUN: 13 mg/dL (ref 6–20)
CALCIUM: 9 mg/dL (ref 8.9–10.3)
CO2: 26 mmol/L (ref 22–32)
CREATININE: 1.01 mg/dL — AB (ref 0.44–1.00)
Chloride: 91 mmol/L — ABNORMAL LOW (ref 101–111)
GFR calc Af Amer: >60 mL/min (ref >60)
Glucose, Bld: 211 mg/dL — ABNORMAL HIGH (ref 65–99)
Potassium: 2.5 mmol/L — CL (ref 3.5–5.1)
Sodium: 133 mmol/L — ABNORMAL LOW (ref 135–145)
TOTAL PROTEIN: 7.8 g/dL (ref 6.5–8.1)

## 2017-09-09 LAB — CBC
HCT: 43.9 % (ref 36.0–46.0)
Hemoglobin: 15 g/dL (ref 12.0–15.0)
MCH: 30.7 pg (ref 26.0–34.0)
MCHC: 34.2 g/dL (ref 30.0–36.0)
MCV: 89.8 fL (ref 78.0–100.0)
PLATELETS: 404 10*3/uL — AB (ref 150–400)
RBC: 4.89 MIL/uL (ref 3.87–5.11)
RDW: 13.8 % (ref 11.5–15.5)
WBC: 12.4 10*3/uL — AB (ref 4.0–10.5)

## 2017-09-09 LAB — POTASSIUM: POTASSIUM: 5 mmol/L (ref 3.5–5.1)

## 2017-09-09 LAB — LIPASE, BLOOD: Lipase: 23 U/L (ref 11–51)

## 2017-09-09 LAB — PROTIME-INR
INR: 0.98
Prothrombin Time: 12.9 seconds (ref 11.4–15.2)

## 2017-09-09 MED ORDER — POTASSIUM CHLORIDE 10 MEQ/100ML IV SOLN
10.0000 meq | INTRAVENOUS | Status: AC
  Administered 2017-09-09 (×3): 10 meq via INTRAVENOUS
  Filled 2017-09-09 (×5): qty 100

## 2017-09-09 MED ORDER — MORPHINE SULFATE (PF) 4 MG/ML IV SOLN
4.0000 mg | Freq: Once | INTRAVENOUS | Status: AC
  Administered 2017-09-09: 4 mg via INTRAVENOUS
  Filled 2017-09-09: qty 1

## 2017-09-09 MED ORDER — SODIUM CHLORIDE 0.9 % IV BOLUS (SEPSIS)
1000.0000 mL | Freq: Once | INTRAVENOUS | Status: AC
  Administered 2017-09-09: 1000 mL via INTRAVENOUS

## 2017-09-09 MED ORDER — IOPAMIDOL (ISOVUE-300) INJECTION 61%
INTRAVENOUS | Status: AC
  Administered 2017-09-09: 100 mL via INTRAVENOUS
  Filled 2017-09-09: qty 100

## 2017-09-09 MED ORDER — FOSFOMYCIN TROMETHAMINE 3 G PO PACK
3.0000 g | PACK | Freq: Once | ORAL | Status: AC
  Administered 2017-09-09: 3 g via ORAL
  Filled 2017-09-09: qty 3

## 2017-09-09 MED ORDER — ONDANSETRON HCL 4 MG PO TABS: 4.0000 mg | ORAL_TABLET | Freq: Three times a day (TID) | ORAL | 0 refills | Status: DC | PRN

## 2017-09-09 MED ORDER — FLEET ENEMA 7-19 GM/118ML RE ENEM
1.0000 | ENEMA | Freq: Once | RECTAL | Status: AC
  Administered 2017-09-09: 1 via RECTAL
  Filled 2017-09-09: qty 1

## 2017-09-09 NOTE — ED Provider Notes (Signed)
[facility] DEPT Provider Note   CSN: [id number] Arrival date & time: 09/09/17  1259     History   Chief Complaint Chief Complaint  Patient presents with  . Abdominal Pain    HPI Ashley [name] is a 51 y.o. female.  The history is provided by the patient and medical records.  Abdominal Pain   This is a new problem. The current episode started more than 2 days ago. The problem occurs constantly. The problem has been gradually worsening. The pain is associated with an unknown factor. The pain is located in the generalized abdominal region. The quality of the pain is aching and cramping. The pain is at a severity of 8/10. The pain is severe. Associated symptoms include nausea, constipation and dysuria. Pertinent negatives include fever, diarrhea, flatus, vomiting and frequency. The symptoms are aggravated by palpation. Nothing relieves the symptoms.    Past Medical History:  Diagnosis Date  . Anxiety   . Bronchitis    uses inhaler if needed for bronchitis, lasted used -2014  . Cataract    bilateral  . Chronic headaches    migraines - in past, uses Phenergan for nausea   . Diabetes mellitus without complication ([place])    taken off Metformin since 04/2014, HgbA1C - normal, will follow up with PCP- Dr. Deborra Medina, 07/2014  . GERD (gastroesophageal reflux disease)   . H/O exercise stress test 2011   done at [facility]- told that it was WNL, done due to pt. having panic attacks   . History of blood transfusion July 31, 1966   at birth in [place], [place], unsure number of units  . Poor dentition    very poor oral health   . SVD (spontaneous vaginal delivery)    x 3  . Tobacco abuse     Patient Active Problem List   Diagnosis Date Noted  . Dizziness 07/09/2017  . Hypokalemia 05/23/2017  . Moderate episode of recurrent major depressive disorder ([place]) 05/16/2017  . Generalized weakness 04/25/2017  . Bilateral leg numbness 04/25/2017  . Diabetes mellitus with complication ([place])   .  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 Paresthesia 03/04/2017  . Hospital discharge follow-up 02/19/2017  . Stroke-like symptom 02/19/2017  . Tobacco abuse 01/08/2017  . Diabetes ([place]) 12/26/2016  . Chronic bronchitis ([place]) 10/20/2015  . HLD (hyperlipidemia) 04/12/2015  . Migraine with aura, intractable 08/12/2014  . S/P hysterectomy with oophorectomy 04/15/2014  . Hyperglycemia 01/26/2013  . Migraine 05/30/2010  . Anxiety state 09/30/2009  . INSOMNIA, CHRONIC 09/30/2009  . ADJUSTMENT DISORDER WITH MIXED FEATURES 09/30/2009  . ELEVATED BLOOD PRESSURE WITHOUT DIAGNOSIS OF HYPERTENSION 09/30/2009    Past Surgical History:  Procedure Laterality Date  . ABDOMINAL HYSTERECTOMY    . APPENDECTOMY  1988  . CARDIAC CATHETERIZATION N/A 08/17/2016   Procedure: Left Heart Cath and Coronary Angiography;  Surgeon: Burnell Blanks, MD;  Location: [facility];  Service: Cardiovascular;  Laterality: N/A;  . CHOLECYSTECTOMY N/A 06/07/2014   Procedure: LAPAROSCOPIC CHOLECYSTECTOMY;  Surgeon: Ralene Ok, MD;  Location: [facility];  Service: General;  Laterality: N/A;  . CHOLECYSTECTOMY  June 07 2014  . COLONOSCOPY  02/16/2014   normal   . ESOPHAGOGASTRODUODENOSCOPY  12/28/2013  . KNEE ARTHROSCOPY  1995   left  . LAPAROSCOPIC ASSISTED VAGINAL HYSTERECTOMY N/A 04/15/2014   Procedure: LAPAROSCOPIC ASSISTED VAGINAL HYSTERECTOMY;  Surgeon: Marylynn Pearson, MD;  Location: [facility];  Service: Gynecology;  Laterality: N/A;  . LAPAROSCOPIC BILATERAL SALPINGO OOPHERECTOMY Bilateral 04/15/2014   Procedure: LAPAROSCOPIC BILATERAL  SALPINGO OOPHORECTOMY;  Surgeon: Marylynn Pearson, MD;  Location: [facility];  Service: Gynecology;  Laterality: Bilateral;  . TUBAL LIGATION      OB History    No data available       Home Medications    Prior to Admission medications   Medication Sig Start Date End Date Taking? Authorizing Provider  albuterol (PROVENTIL HFA;VENTOLIN HFA) 108 (90 Base) MCG/ACT inhaler  Inhale 2 puffs into the lungs every 6 (six) hours as needed for wheezing or shortness of breath.    [provider]  amitriptyline (ELAVIL) 50 MG tablet TAKE ONE AND ONE-HALF TABLET BY MOUTH AT DINNER TIME Patient taking differently: TAKE ONE AND ONE-HALF TABLET (75 MG) BY MOUTH AT BEDTIME 05/13/17   Lucille Passy, MD  aspirin EC 81 MG tablet Take 81 mg by mouth daily.     [provider]  clonazePAM (KLONOPIN) 1 MG tablet Take 1 tablet (1 mg total) by mouth 2 (two) times daily. for anxiety 07/29/17   Lucille Passy, MD  dicyclomine (BENTYL) 10 MG capsule TAKE ONE CAPSULE BY MOUTH TWICE DAILY AS NEEDED FOR  CRAMPING  AND  ABDOMINAL  PAIN Patient taking differently: TAKE ONE CAPSULE (10 MG) BY MOUTH TWICE DAILY  FOR  CRAMPING  AND  ABDOMINAL  PAIN 10/05/16   Lucille Passy, MD  docusate sodium (COLACE) 100 MG capsule Take 100 mg by mouth at bedtime.    [provider]  famotidine (PEPCID) 20 MG tablet One at bedtime Patient taking differently: Take 20 mg by mouth at bedtime.  08/29/16   Tanda Rockers, MD  furosemide (LASIX) 40 MG tablet Take 40 mg by mouth daily. 06/15/17   [provider]  furosemide (LASIX) 40 MG tablet TAKE ONE TABLET BY MOUTH  DAILY 08/28/17   Lyda Jester M, PA-C  HYDROcodone-acetaminophen (NORCO/VICODIN) 5-325 MG tablet Take 1 tablet by mouth every 4 (four) hours as needed. 08/17/17   Tanna Furry, MD  ibuprofen (ADVIL,MOTRIN) 600 MG tablet Take 1 tablet (600 mg total) by mouth every 6 (six) hours as needed. 07/17/17   Lucille Passy, MD  metFORMIN (GLUCOPHAGE) 500 MG tablet Take 1 tablet (500 mg total) by mouth 2 (two) times daily with a meal. 08/12/17   Lucille Passy, MD  methocarbamol (ROBAXIN-750) 750 MG tablet Take 1 tablet (750 mg total) by mouth 4 (four) times daily. Patient taking differently: Take 750 mg by mouth 4 (four) times daily as needed (chest pain).  06/23/17   Lacretia Leigh, MD  omeprazole (PRILOSEC) 20 MG capsule Take 1 capsule (20  mg total) by mouth 2 (two) times daily. 09/20/16   Lucille Passy, MD  ondansetron (ZOFRAN ODT) 4 MG disintegrating tablet Take 1 tablet (4 mg total) by mouth every 8 (eight) hours as needed for nausea. 08/17/17   Tanna Furry, MD  pantoprazole (PROTONIX) 20 MG tablet Take 1 tablet (20 mg total) by mouth 2 (two) times daily. 08/17/17   Tanna Furry, MD  Potassium Chloride ER 20 MEQ TBCR Take 20 mEq by mouth daily. 08/28/17   Lucille Passy, MD  promethazine (PHENERGAN) 25 MG tablet Take 1 tablet (25 mg total) by mouth every 6 (six) hours as needed for nausea or vomiting. 07/17/17   Lucille Passy, MD  rizatriptan (MAXALT-MLT) 10 MG disintegrating tablet Take 1 tablet (10 mg total) by mouth as needed for migraine. May repeat in 2 hours if needed. Do not take more than 3 tablets in  a week. 07/17/17   Lucille Passy, MD  traZODone (DESYREL) 150 MG tablet Take 1 tablet (150 mg total) by mouth at bedtime. 08/12/17   Lucille Passy, MD  venlafaxine XR (EFFEXOR-XR) 37.5 MG 24 hr capsule TAKE 1 CAPSULE BY MOUTH DAILY, INCREASE TO 2 CAPUSLES IN 1 WEEK Patient taking differently: TAKE 75 MG  BY MOUTH DAILY 07/29/17   Daron Offer, Richard Miu, MD    Family History Family History  Problem Relation Age of Onset  . Diabetes Mother   . Hyperlipidemia Mother   . Hypertension Mother   . Anxiety disorder Mother   . Depression Mother   . Drug abuse Mother   . Prostate cancer Father   . Alcohol abuse Father   . Lung cancer Maternal Grandfather        smoked  . Emphysema Maternal Grandfather        smoked  . COPD Maternal Grandmother        never smoked   . Dementia Maternal Grandmother   . Thyroid cancer Paternal Aunt   . Colon cancer Neg Hx   . Rectal cancer Neg Hx   . Stomach cancer Neg Hx   . Migraines Neg Hx     Social History Social History  Substance Use Topics  . Smoking status: Current Every Day Smoker    Packs/day: 2.00    Years: 35.00    Types: Cigarettes  . Smokeless tobacco: Never Used     Comment:  08-17-17- pt reports smokes "almost 2 packs a day"  . Alcohol use No     Allergies   Penicillins and Topamax [topiramate]   Review of Systems Review of Systems  Constitutional: Negative for chills, diaphoresis, fatigue and fever.  HENT: Negative for congestion and rhinorrhea.   Respiratory: Negative for cough, chest tightness, shortness of breath, wheezing and stridor.   Cardiovascular: Negative for chest pain and palpitations.  Gastrointestinal: Positive for abdominal distention, abdominal pain, constipation and nausea. Negative for diarrhea, flatus and vomiting.  Genitourinary: Positive for difficulty urinating and dysuria. Negative for flank pain and frequency.  Musculoskeletal: Negative for back pain, neck pain and neck stiffness.  Neurological: Negative for light-headedness and numbness.  Psychiatric/Behavioral: Negative for agitation.  All other systems reviewed and are negative.    Physical Exam Updated Vital Signs BP 122/77 (BP Location: Right Arm)   Pulse (!) 112   Temp 98.5 F (36.9 C) (Oral)   Resp 18   LMP 02/12/2014   SpO2 98%   Physical Exam  Constitutional: She is oriented to person, place, and time. She appears well-developed and well-nourished. No distress.  HENT:  Head: Normocephalic and atraumatic.  Mouth/Throat: Oropharynx is clear and moist. No oropharyngeal exudate.  Eyes: Pupils are equal, round, and reactive to light. Conjunctivae and EOM are normal.  Neck: Normal range of motion. Neck supple.  Cardiovascular: Normal rate and intact distal pulses.   No murmur heard. Pulmonary/Chest: Effort normal and breath sounds normal. No stridor. No respiratory distress. She has no wheezes. She exhibits no tenderness.  Abdominal: Soft. She exhibits distension. There is tenderness. There is no rebound.  Musculoskeletal: She exhibits no edema.  Neurological: She is alert and oriented to person, place, and time.  Skin: Skin is warm and dry. Capillary refill takes  less than 2 seconds. No rash noted. She is not diaphoretic.  Psychiatric: She has a normal mood and affect.  Nursing note and vitals reviewed.    ED Treatments / Results  Labs (  all labs ordered are listed, but only abnormal results are displayed) Labs Reviewed  COMPREHENSIVE METABOLIC PANEL - Abnormal; Notable for the following:       Result Value   Sodium 133 (*)    Potassium 2.5 (*)    Chloride 91 (*)    Glucose, Bld 211 (*)    Creatinine, Ser 1.01 (*)    AST 55 (*)    ALT 69 (*)    Alkaline Phosphatase 140 (*)    Anion gap 16 (*)    All other components within normal limits  CBC - Abnormal; Notable for the following:    WBC 12.4 (*)    Platelets 404 (*)    All other components within normal limits  URINALYSIS, ROUTINE W REFLEX MICROSCOPIC - Abnormal; Notable for the following:    Color, Urine AMBER (*)    APPearance HAZY (*)    Nitrite POSITIVE (*)    Bacteria, UA MANY (*)    Squamous Epithelial / LPF 0-5 (*)    All other components within normal limits  I-STAT CG4 LACTIC ACID, ED - Abnormal; Notable for the following:    Lactic Acid, Venous 2.36 (*)    All other components within normal limits  CULTURE, BLOOD (ROUTINE X 2)  CULTURE, BLOOD (ROUTINE X 2)  URINE CULTURE  LIPASE, BLOOD  POTASSIUM  PROTIME-INR  I-STAT CG4 LACTIC ACID, ED    EKG  EKG Interpretation  Date/Time:  Monday September 09 2017 14:49:27 EDT Ventricular Rate:  93 PR Interval:    QRS Duration: 118 QT Interval:  416 QTC Calculation: 518 R Axis:   -2 Text Interpretation:  Sinus rhythm Nonspecific intraventricular conduction delay Borderline repolarization abnormality T wave abnormality Artifact Abnormal ekg Confirmed by Carmin Muskrat [phone]) on 09/09/2017 2:58:28 PM Also confirmed by Carmin Muskrat ([id number]), editor Philomena Doheny [phone])  on 09/09/2017 3:30:33 PM       Radiology Ct Abdomen Pelvis W Contrast  Result Date: 09/09/2017 CLINICAL DATA:  Abdominal distension for 1 week, lower  abdominal pain, no bowel movement for 1 week. Remote hysterectomy, appendectomy and cholecystectomy EXAM: CT ABDOMEN AND PELVIS WITH CONTRAST TECHNIQUE: Multidetector CT imaging of the abdomen and pelvis was performed using the standard protocol following bolus administration of intravenous contrast. CONTRAST:  113mL ISOVUE-300 IOPAMIDOL (ISOVUE-300) INJECTION 61% COMPARISON:  08/22/2016 FINDINGS: Lower chest: Minor bibasilar atelectasis. Normal heart size. No pericardial or pleural effusion Hepatobiliary: No focal liver abnormality is seen. Status post cholecystectomy. No biliary dilatation. Pancreas: Unremarkable. No pancreatic ductal dilatation or surrounding inflammatory changes. Spleen: Normal in size without focal abnormality. Adrenals/Urinary Tract: Adrenal glands are unremarkable. Kidneys are normal, without renal calculi, focal lesion, or hydronephrosis. Bladder is unremarkable. Stomach/Bowel: Negative for bowel obstruction or ileus. No free air, ascites, fluid collection, or abscess. Moderate distention of the entire colon with large volume of retained stool throughout compatible with constipation. No definite focal colonic abnormality or obstructing process. Vascular/Lymphatic: Aortoiliac atherosclerosis. No vascular occlusive process. Mesenteric and renal vasculature appear patent. No acute vascular process. No adenopathy. Reproductive: Remote hysterectomy. No pelvic mass or adnexal abnormality. No pelvic free fluid or fluid collection. Other: No abdominal wall hernia or abnormality. No abdominopelvic ascites. Musculoskeletal: No acute significant osseous IMPRESSION: Moderate colonic distention with large volume of retained formed stool compatible constipation. No associated bowel obstruction Aortoiliac atherosclerosis Remote cholecystectomy, appendectomy, and hysterectomy Electronically Signed   By: Jerilynn Mages.  Shick M.D.   On: 09/09/2017 18:16    Procedures Procedures (including critical care  time)  Medications Ordered in ED Medications  potassium chloride 10 mEq in 100 mL IVPB (0 mEq Intravenous Stopped 09/09/17 2130)  sodium chloride 0.9 % bolus 1,000 mL (0 mLs Intravenous Stopped 09/09/17 1700)  morphine 4 MG/ML injection 4 mg (4 mg Intravenous Given 09/09/17 1547)  iopamidol (ISOVUE-300) 61 % injection (100 mLs Intravenous Contrast Given 09/09/17 1755)  sodium phosphate (FLEET) 7-19 GM/118ML enema 1 enema (1 enema Rectal Given 09/09/17 2039)  fosfomycin (MONUROL) packet 3 g (3 g Oral Given 09/09/17 2330)     Initial Impression / Assessment and Plan / ED Course  I have reviewed the triage vital signs and the nursing notes.  Pertinent labs & imaging results that were available during my care of the patient were reviewed by me and considered in my medical decision making (see chart for details).     Ashley [name] is a 51 y.o. female with a past medical history significant for hypertension, diabetes, anxiety, migraines, and prior abdominal surgeries who presents with abdominal pain, nausea, absent flatus, constipation, and abdominal distention. Patient says that her last BM was 7 days ago. She says that 5 days ago, she began having abdominal pain. It has gradually worsened in severity. It is constant and cramping all across her abdomen. It is associated with nausea but no vomiting. She reports decreased appetite and cannoteat. She denies fevers or chills but reports her abdomen appears to be growing distention. She has a history of cholecystectomy, appendectomy, and hysterectomy with oophorectomy. She denies any histories of kidney stones or prior bowel obstructions. Patient also reports some mild dysuria and having to strain very hard to urinate.  On exam, patient has distended abdomen. Minimal bowel sounds. Abdomen is diffusely tender. No CVA tenderness or flank tenderness. Lungs are clear. Chest is nontender. No murmurs appreciated.No significant lower extremity edema seen.  Based on  description of symptoms and exam, there is clinical concern for bowel obstruction. Patient will have laboratory testing and imaging to further evaluate.  Laboratory testing began to return showing evidence of urinary tract infection. Patient also has hypokalemia of 2.5. Patient will have her potassium repleted during workup. Patient was given pain medicine and fluids.  EKG showed some T-wave abnormalities and prolonged QT. Will hold on nausea medicine as she is not actively vomiting.  Anticipate reassessment following CT imaging.  Diagnostic testing results are seen above. CT imaging did not show evidence of obstruction but did show very large stool burden and constipation.  Given patient's failure of outpatient home management of the constipation, and enema was offered here. Patient wants to try it and an enema was given. Patient then had a large bowel movement and improvement in symptoms.  Patient was also found to have a urinary tract infection. Due to her intolerances and lack of urosepsis, fosfomycin was provided. Patient also received the potassium for hypokalemia. Repeat potassium was normal.  Patient had no other symptoms.  Pt felt stable for discharge home as her symptoms were improving. Patient will follow-up with her PCP for further management. Patient has MiraLAX at home which she will take and understood instructions for her constipation management. Patient had no other questions or concerns and was discharged in good condition.    Final Clinical Impressions(s) / ED Diagnoses   Final diagnoses:  Acute cystitis without hematuria  Generalized abdominal pain  Constipation, unspecified constipation type    New Prescriptions New Prescriptions   ONDANSETRON (ZOFRAN) 4 MG TABLET    Take 1 tablet (4 mg total)  by mouth every 8 (eight) hours as needed for nausea or vomiting.    Clinical Impression: 1. Acute cystitis without hematuria   2. Generalized abdominal pain   3.  Constipation, unspecified constipation type     Disposition: Discharge  Condition: Good  I have discussed the results, Dx and Tx plan with the pt(& family if present). He/she/they expressed understanding and agree(s) with the plan. Discharge instructions discussed at great length. Strict return precautions discussed and pt &/or family have verbalized understanding of the instructions. No further questions at time of discharge.    New Prescriptions   ONDANSETRON (ZOFRAN) 4 MG TABLET    Take 1 tablet (4 mg total) by mouth every 8 (eight) hours as needed for nausea or vomiting.    Follow Up: Lucille Passy, MD [place] [id number] [phone]  Schedule an appointment as soon as possible for a visit    [facility] [address] [id number] [place] [phone]  If symptoms worsen    Chermaine Schnyder, Gwenyth Allegra, MD 09/09/17 2340

## 2017-09-09 NOTE — ED Notes (Signed)
Elevated CG-4 reported to Dr. Sherry Ruffing

## 2017-09-09 NOTE — ED Notes (Signed)
2ND set of BC collected from right hand

## 2017-09-09 NOTE — ED Notes (Signed)
ED Provider at bedside. 

## 2017-09-09 NOTE — ED Notes (Signed)
Writer was able to get blood, but mini lab stated they needed a little bit more blood work in the tube.  Josh (EMT) will attempt to stick patient for I-stat lactic.

## 2017-09-09 NOTE — Telephone Encounter (Signed)
[facility]  Patient Name: Ashley [name]  DOB: Feb 15, 1966    Initial Comment Caller states she has been constipated for a week- used suppository and miralax and nothing is working. She is hurting badly at the bottom of her stomach and urine flow is not normal    Nurse Assessment  Nurse: Joline Salt, RN, Malachy Mood Date/Time (Eastern Time): 09/09/2017 12:03:38 PM  Confirm and document reason for call. If symptomatic, describe symptoms. ---Caller states she has been constipated for a week. She has used fleet suppostory x3 nights and miralax using daily and unsuccessful. She is cramping badly at the bottom of her stomach and urine flow is not normal. She has to force the urine out. No fever. BP was 159/91 while at the dentist this am and pulse 101.  Does the patient have any new or worsening symptoms? ---Yes  Will a triage be completed? ---Yes  Related visit to physician within the last 2 weeks? ---No  Does the PT have any chronic conditions? (i.e. diabetes, asthma, etc.) ---Yes  List chronic conditions. ---diabetes 2  Is the patient pregnant or possibly pregnant? (Ask all females between the ages of 23-55) ---No  Is this a behavioral health or substance abuse call? ---No     Guidelines    Guideline Title Affirmed Question Affirmed Notes  Constipation [1] Constant abdominal pain AND [2] present > 2 hours    Final Disposition User   See Physician within 4 Hours (or PCP triage) Joline Salt, RN, Malachy Mood    Referrals  [facility] - ED   Disagree/Comply: Comply

## 2017-09-09 NOTE — ED Triage Notes (Signed)
Pt is here for lower back abdominal pain that radiates up right side of abdomen since Thursday and states she has been constipated since last Tuesday.  Not passing gas and has concerns she is having to bare down to urinate.

## 2017-09-09 NOTE — Discharge Instructions (Signed)
Your imaging today showed constipation with no evidence of obstruction. We did find a urinary tract infection which she received antibiotics for. Please use the nausea medicine to help stay hydrated and please use the MiraLAX at home to help with your constipation. Please follow-up with your primary care physician for further management. If any symptoms change or worsen, please return to the nearest emergency department.

## 2017-09-10 LAB — URINE CULTURE

## 2017-09-12 ENCOUNTER — Encounter: Payer: Self-pay | Admitting: Family Medicine

## 2017-09-12 ENCOUNTER — Ambulatory Visit (INDEPENDENT_AMBULATORY_CARE_PROVIDER_SITE_OTHER): Payer: BLUE CROSS/BLUE SHIELD | Admitting: Family Medicine

## 2017-09-12 VITALS — BP 102/70 | HR 99 | Ht 62.0 in | Wt 157.0 lb

## 2017-09-12 DIAGNOSIS — Z23 Encounter for immunization: Secondary | ICD-10-CM | POA: Diagnosis not present

## 2017-09-12 DIAGNOSIS — R51 Headache: Secondary | ICD-10-CM | POA: Diagnosis not present

## 2017-09-12 DIAGNOSIS — G44011 Episodic cluster headache, intractable: Secondary | ICD-10-CM | POA: Diagnosis not present

## 2017-09-12 DIAGNOSIS — K5900 Constipation, unspecified: Secondary | ICD-10-CM | POA: Diagnosis not present

## 2017-09-12 DIAGNOSIS — R109 Unspecified abdominal pain: Secondary | ICD-10-CM | POA: Insufficient documentation

## 2017-09-12 DIAGNOSIS — R519 Headache, unspecified: Secondary | ICD-10-CM

## 2017-09-12 DIAGNOSIS — R1030 Lower abdominal pain, unspecified: Secondary | ICD-10-CM

## 2017-09-12 DIAGNOSIS — E876 Hypokalemia: Secondary | ICD-10-CM

## 2017-09-12 MED ORDER — AMITRIPTYLINE HCL 50 MG PO TABS
ORAL_TABLET | ORAL | 3 refills | Status: DC
Start: 2017-09-12 — End: 2018-05-12

## 2017-09-12 MED ORDER — PANTOPRAZOLE SODIUM 20 MG PO TBEC
20.0000 mg | DELAYED_RELEASE_TABLET | Freq: Two times a day (BID) | ORAL | 3 refills | Status: DC
Start: 2017-09-12 — End: 2018-01-28

## 2017-09-12 NOTE — Patient Instructions (Signed)
Good to see you.  Please schedule an appointment with the lab to recheck your potassium in 2 weeks.

## 2017-09-12 NOTE — Assessment & Plan Note (Signed)
Improved s/p abx for UTI and miralax for constipation.

## 2017-09-12 NOTE — Progress Notes (Signed)
Subjective:   Patient ID: Ashley [name], female    DOB: 16-Apr-1966, 51 y.o.   MRN: [id number]  Ashley [name] is a pleasant 51 y.o. year old female who presents to clinic today with Hospitalization Follow-up  on 09/12/2017  HPI:  Was seen in the ER for abdominal pain on 09/09/17.  Note reviewed.  Had abdominal pain and constipation for 7 days prior to presenting to the ER. Denies fevers or chills. Did complain of abdominal distension.  Also so mild dysuria and difficulty urinating.  UA pos for UTI-  + nitrite, many bacteria Given fosfomycin and discharged home.  Urine cx - insignificant growth.  Feeling a bit better, had a BM with daily Miralax.  Still having some abdominal pain.  Hypokalemia- persistent issue while she has been on lasix but she stopped taking lasix a few days ago.  Feels she has not had any edema.  Lab Results  Component Value Date   NA 133 (L) 09/09/2017   K 5.0 09/09/2017   CL 91 (L) 09/09/2017   CO2 26 09/09/2017    Current Outpatient Prescriptions on File Prior to Visit  Medication Sig Dispense Refill  . albuterol (PROVENTIL HFA;VENTOLIN HFA) 108 (90 Base) MCG/ACT inhaler Inhale 2 puffs into the lungs every 6 (six) hours as needed for wheezing or shortness of breath.    Marland Kitchen aspirin EC 81 MG tablet Take 81 mg by mouth daily.     . clonazePAM (KLONOPIN) 1 MG tablet Take 1 tablet (1 mg total) by mouth 2 (two) times daily. for anxiety 60 tablet 0  . dicyclomine (BENTYL) 10 MG capsule TAKE ONE CAPSULE BY MOUTH TWICE DAILY AS NEEDED FOR  CRAMPING  AND  ABDOMINAL  PAIN 180 capsule 1  . docusate sodium (COLACE) 100 MG capsule Take 100 mg by mouth at bedtime.    . metFORMIN (GLUCOPHAGE) 500 MG tablet Take 1 tablet (500 mg total) by mouth 2 (two) times daily with a meal. 180 tablet 3  . ondansetron (ZOFRAN ODT) 4 MG disintegrating tablet Take 1 tablet (4 mg total) by mouth every 8 (eight) hours as needed for nausea. 6 tablet 0  . promethazine (PHENERGAN) 25 MG  tablet Take 1 tablet (25 mg total) by mouth every 6 (six) hours as needed for nausea or vomiting. 30 tablet 0  . rizatriptan (MAXALT-MLT) 10 MG disintegrating tablet Take 1 tablet (10 mg total) by mouth as needed for migraine. May repeat in 2 hours if needed. Do not take more than 3 tablets in a week. 10 tablet 3  . traZODone (DESYREL) 150 MG tablet Take 1 tablet (150 mg total) by mouth at bedtime. 90 tablet 3   No current facility-administered medications on file prior to visit.     Allergies  Allergen Reactions  . Penicillins Anaphylaxis and Swelling    Throat swells Has patient had a PCN reaction causing immediate rash, facial/tongue/throat swelling, SOB or lightheadedness with hypotension: Yes Has patient had a PCN reaction causing severe rash involving mucus membranes or skin necrosis: No Has patient had a PCN reaction that required hospitalization: No Has patient had a PCN reaction occurring within the last 10 years: No If all of the above answers are "NO", then may proceed with Cephalosporin use.   . Topamax [Topiramate] Other (See Comments)    Hands and feet  go numb    Past Medical History:  Diagnosis Date  . Anxiety   . Bronchitis    uses inhaler if needed  for bronchitis, lasted used -2014  . Cataract    bilateral  . Chronic headaches    migraines - in past, uses Phenergan for nausea   . Diabetes mellitus without complication ([place])    taken off Metformin since 04/2014, HgbA1C - normal, will follow up with PCP- Dr. Deborra Medina, 07/2014  . GERD (gastroesophageal reflux disease)   . H/O exercise stress test 2011   done at [facility]- told that it was WNL, done due to pt. having panic attacks   . History of blood transfusion 09/26/1966   at birth in [place], [place], unsure number of units  . Poor dentition    very poor oral health   . SVD (spontaneous vaginal delivery)    x 3  . Tobacco abuse     Past Surgical History:  Procedure Laterality Date  . ABDOMINAL HYSTERECTOMY    .  APPENDECTOMY  1988  . CARDIAC CATHETERIZATION N/A 08/17/2016   Procedure: Left Heart Cath and Coronary Angiography;  Surgeon: Burnell Blanks, MD;  Location: [facility];  Service: Cardiovascular;  Laterality: N/A;  . CHOLECYSTECTOMY N/A 06/07/2014   Procedure: LAPAROSCOPIC CHOLECYSTECTOMY;  Surgeon: Ralene Ok, MD;  Location: [facility];  Service: General;  Laterality: N/A;  . CHOLECYSTECTOMY  June 07 2014  . COLONOSCOPY  02/16/2014   normal   . ESOPHAGOGASTRODUODENOSCOPY  12/28/2013  . KNEE ARTHROSCOPY  1995   left  . LAPAROSCOPIC ASSISTED VAGINAL HYSTERECTOMY N/A 04/15/2014   Procedure: LAPAROSCOPIC ASSISTED VAGINAL HYSTERECTOMY;  Surgeon: Marylynn Pearson, MD;  Location: [facility];  Service: Gynecology;  Laterality: N/A;  . LAPAROSCOPIC BILATERAL SALPINGO OOPHERECTOMY Bilateral 04/15/2014   Procedure: LAPAROSCOPIC BILATERAL SALPINGO OOPHORECTOMY;  Surgeon: Marylynn Pearson, MD;  Location: [facility];  Service: Gynecology;  Laterality: Bilateral;  . TUBAL LIGATION      Family History  Problem Relation Age of Onset  . Diabetes Mother   . Hyperlipidemia Mother   . Hypertension Mother   . Anxiety disorder Mother   . Depression Mother   . Drug abuse Mother   . Prostate cancer Father   . Alcohol abuse Father   . Lung cancer Maternal Grandfather        smoked  . Emphysema Maternal Grandfather        smoked  . COPD Maternal Grandmother        never smoked   . Dementia Maternal Grandmother   . Thyroid cancer Paternal Aunt   . Colon cancer Neg Hx   . Rectal cancer Neg Hx   . Stomach cancer Neg Hx   . Migraines Neg Hx     Social History   Social History  . Marital status: Married    Spouse name: Alvester Chou  . Number of children: 6  . Years of education: 92   Occupational History  .       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 Smoking status: Current Every Day Smoker    Packs/day: 2.00    Years: 35.00    Types: Cigarettes  . Smokeless tobacco:  Never Used     Comment: 08-17-17- pt reports smokes "almost 2 packs a day"  . Alcohol use No  . Drug use: No  . Sexual activity: Yes    Partners: Male    Birth control/ protection: Surgical   Other Topics Concern  . Not on file   Social History Narrative   5 living children, one child is a crack cocaine addict who  often breaks into their house to steal money. One child died of cerebral palsy.   Caffeine use: Dr Malachi Bonds (3 per day)   2 cups coffee per day   The PMH, PSH, Social History, Family History, Medications, and allergies have been reviewed in [facility], and have been updated if relevant.     Review of Systems  Constitutional: Negative.   Cardiovascular: Negative for leg swelling.  Gastrointestinal: Positive for abdominal pain and constipation. Negative for abdominal distention, anal bleeding, blood in stool, diarrhea, nausea, rectal pain and vomiting.  Genitourinary: Negative.   Musculoskeletal: Negative.   All other systems reviewed and are negative.      Objective:    BP 102/70   Pulse 99   Ht 5\' 2"  (1.575 m)   Wt 157 lb (71.2 kg)   LMP 02/12/2014   SpO2 96%   BMI 28.72 kg/m    Physical Exam  Constitutional: She is oriented to person, place, and time. She appears well-developed and well-nourished. No distress.  HENT:  Head: Normocephalic and atraumatic.  Eyes: Conjunctivae are normal.  Cardiovascular: Normal rate.   Pulmonary/Chest: Effort normal.  Abdominal: Soft. She exhibits no mass. There is no tenderness. There is no rebound and no guarding.  Musculoskeletal: She exhibits no edema.  Neurological: She is alert and oriented to person, place, and time. No cranial nerve deficit.  Skin: Skin is warm and dry. She is not diaphoretic.  Psychiatric: She has a normal mood and affect. Her behavior is normal. Judgment and thought content normal.  Nursing note and vitals reviewed.         Assessment & Plan:   Hypokalemia - Plan: Basic metabolic panel  Lower  abdominal pain  Worsening headaches - Plan: amitriptyline (ELAVIL) 50 MG tablet  Intractable episodic cluster headache - Plan: amitriptyline (ELAVIL) 50 MG tablet  Constipation, unspecified constipation type No Follow-up on file.

## 2017-09-12 NOTE — Assessment & Plan Note (Signed)
Continue daily miralax until she feels stools have returned to normal.

## 2017-09-12 NOTE — Addendum Note (Signed)
Addended by: Amado Coe on: 09/12/2017 09:22 AM   Modules accepted: Orders

## 2017-09-12 NOTE — Assessment & Plan Note (Signed)
She has d/c'd lasix so will d/c potassium as well. Recheck potassium in 2 weeks- orders entered.

## 2017-09-14 LAB — CULTURE, BLOOD (ROUTINE X 2)
CULTURE: NO GROWTH
CULTURE: NO GROWTH
SPECIAL REQUESTS: ADEQUATE
Special Requests: ADEQUATE

## 2017-09-26 ENCOUNTER — Other Ambulatory Visit: Payer: Self-pay

## 2017-09-27 ENCOUNTER — Other Ambulatory Visit (INDEPENDENT_AMBULATORY_CARE_PROVIDER_SITE_OTHER): Payer: BLUE CROSS/BLUE SHIELD

## 2017-09-27 DIAGNOSIS — E876 Hypokalemia: Secondary | ICD-10-CM | POA: Diagnosis not present

## 2017-09-27 LAB — BASIC METABOLIC PANEL
BUN: 9 mg/dL (ref 6–23)
CALCIUM: 10 mg/dL (ref 8.4–10.5)
CO2: 35 mEq/L — ABNORMAL HIGH (ref 19–32)
Chloride: 93 mEq/L — ABNORMAL LOW (ref 96–112)
Creatinine, Ser: 0.8 mg/dL (ref 0.40–1.20)
GFR: 80.15 mL/min (ref >60.00)
Glucose, Bld: 255 mg/dL — ABNORMAL HIGH (ref 70–99)
POTASSIUM: 3.7 meq/L (ref 3.5–5.1)
SODIUM: 140 meq/L (ref 135–145)

## 2017-09-30 ENCOUNTER — Telehealth: Payer: Self-pay | Admitting: Internal Medicine

## 2017-09-30 NOTE — Telephone Encounter (Signed)
Patient is requesting to transfer care. She does want to drive out to [place] location. You see her husband, Tinzley Dalia.   Please advise. Thank you.   Patient is aware Dr. Ronnald Ramp is out of office until Oct 15.

## 2017-10-01 ENCOUNTER — Emergency Department (HOSPITAL_COMMUNITY)
Admission: EM | Admit: 2017-10-01 | Discharge: 2017-10-02 | Disposition: A | Discharge status: Home / Self Care | Payer: BLUE CROSS/BLUE SHIELD | Attending: Emergency Medicine | Admitting: Emergency Medicine

## 2017-10-01 ENCOUNTER — Emergency Department (HOSPITAL_COMMUNITY): Payer: BLUE CROSS/BLUE SHIELD

## 2017-10-01 ENCOUNTER — Ambulatory Visit (INDEPENDENT_AMBULATORY_CARE_PROVIDER_SITE_OTHER): Payer: BLUE CROSS/BLUE SHIELD | Admitting: Family Medicine

## 2017-10-01 ENCOUNTER — Encounter: Payer: Self-pay | Admitting: Family Medicine

## 2017-10-01 ENCOUNTER — Other Ambulatory Visit: Payer: Self-pay

## 2017-10-01 ENCOUNTER — Encounter (HOSPITAL_COMMUNITY): Payer: Self-pay | Admitting: Emergency Medicine

## 2017-10-01 VITALS — BP 122/74 | HR 86 | Temp 98.0°F | Ht 62.0 in | Wt 163.0 lb

## 2017-10-01 DIAGNOSIS — R202 Paresthesia of skin: Secondary | ICD-10-CM | POA: Diagnosis not present

## 2017-10-01 DIAGNOSIS — F419 Anxiety disorder, unspecified: Secondary | ICD-10-CM

## 2017-10-01 DIAGNOSIS — R531 Weakness: Secondary | ICD-10-CM | POA: Diagnosis not present

## 2017-10-01 DIAGNOSIS — E119 Type 2 diabetes mellitus without complications: Secondary | ICD-10-CM | POA: Insufficient documentation

## 2017-10-01 DIAGNOSIS — Z79899 Other long term (current) drug therapy: Secondary | ICD-10-CM | POA: Insufficient documentation

## 2017-10-01 DIAGNOSIS — F17210 Nicotine dependence, cigarettes, uncomplicated: Secondary | ICD-10-CM | POA: Insufficient documentation

## 2017-10-01 DIAGNOSIS — F331 Major depressive disorder, recurrent, moderate: Secondary | ICD-10-CM | POA: Diagnosis not present

## 2017-10-01 DIAGNOSIS — R251 Tremor, unspecified: Secondary | ICD-10-CM | POA: Diagnosis not present

## 2017-10-01 DIAGNOSIS — R51 Headache: Secondary | ICD-10-CM | POA: Diagnosis not present

## 2017-10-01 DIAGNOSIS — Z7984 Long term (current) use of oral hypoglycemic drugs: Secondary | ICD-10-CM | POA: Diagnosis not present

## 2017-10-01 DIAGNOSIS — Z7982 Long term (current) use of aspirin: Secondary | ICD-10-CM | POA: Diagnosis not present

## 2017-10-01 LAB — COMPREHENSIVE METABOLIC PANEL
ALBUMIN: 3.2 g/dL — AB (ref 3.5–5.0)
ALT: 19 U/L (ref 14–54)
ANION GAP: 8 (ref 5–15)
AST: 16 U/L (ref 15–41)
Alkaline Phosphatase: 89 U/L (ref 38–126)
BUN: 7 mg/dL (ref 6–20)
CALCIUM: 8.7 mg/dL — AB (ref 8.9–10.3)
CO2: 23 mmol/L (ref 22–32)
CREATININE: 0.63 mg/dL (ref 0.44–1.00)
Chloride: 105 mmol/L (ref 101–111)
GFR calc Af Amer: >60 mL/min (ref >60)
GFR calc non Af Amer: >60 mL/min (ref >60)
Glucose, Bld: 198 mg/dL — ABNORMAL HIGH (ref 65–99)
Potassium: 3.3 mmol/L — ABNORMAL LOW (ref 3.5–5.1)
SODIUM: 136 mmol/L (ref 135–145)
Total Bilirubin: 0.4 mg/dL (ref 0.3–1.2)
Total Protein: 6.3 g/dL — ABNORMAL LOW (ref 6.5–8.1)

## 2017-10-01 LAB — CBC WITH DIFFERENTIAL/PLATELET
BASOS ABS: 0 10*3/uL (ref 0.0–0.1)
BASOS PCT: 0 %
EOS ABS: 0.3 10*3/uL (ref 0.0–0.7)
Eosinophils Relative: 4 %
HCT: 35.4 % — ABNORMAL LOW (ref 36.0–46.0)
Hemoglobin: 11.6 g/dL — ABNORMAL LOW (ref 12.0–15.0)
Lymphocytes Relative: 26 %
Lymphs Abs: 2.3 10*3/uL (ref 0.7–4.0)
MCH: 30.3 pg (ref 26.0–34.0)
MCHC: 32.8 g/dL (ref 30.0–36.0)
MCV: 92.4 fL (ref 78.0–100.0)
MONO ABS: 0.6 10*3/uL (ref 0.1–1.0)
MONOS PCT: 7 %
Neutro Abs: 5.4 10*3/uL (ref 1.7–7.7)
Neutrophils Relative %: 63 %
Platelets: 401 10*3/uL — ABNORMAL HIGH (ref 150–400)
RBC: 3.83 MIL/uL — ABNORMAL LOW (ref 3.87–5.11)
RDW: 14.7 % (ref 11.5–15.5)
WBC: 8.7 10*3/uL (ref 4.0–10.5)

## 2017-10-01 LAB — I-STAT CHEM 8, ED
BUN: 8 mg/dL (ref 6–20)
CHLORIDE: 106 mmol/L (ref 101–111)
CREATININE: 0.5 mg/dL (ref 0.44–1.00)
Calcium, Ion: 1.01 mmol/L — ABNORMAL LOW (ref 1.15–1.40)
Glucose, Bld: 221 mg/dL — ABNORMAL HIGH (ref 65–99)
HEMATOCRIT: 36 % (ref 36.0–46.0)
HEMOGLOBIN: 12.2 g/dL (ref 12.0–15.0)
Potassium: 3.7 mmol/L (ref 3.5–5.1)
Sodium: 138 mmol/L (ref 135–145)
TCO2: 22 mmol/L (ref 22–32)

## 2017-10-01 LAB — I-STAT TROPONIN, ED: Troponin i, poc: 0 ng/mL (ref 0.00–0.08)

## 2017-10-01 LAB — I-STAT BETA HCG BLOOD, ED (MC, WL, AP ONLY): HCG, QUANTITATIVE: 6.1 m[IU]/mL — AB (ref <5)

## 2017-10-01 LAB — ETHANOL

## 2017-10-01 MED ORDER — CLONAZEPAM 1 MG PO TABS: 1.0000 mg | ORAL_TABLET | Freq: Two times a day (BID) | ORAL | 0 refills | Status: DC

## 2017-10-01 MED ORDER — VENLAFAXINE HCL 37.5 MG PO TABS
37.5000 mg | ORAL_TABLET | Freq: Two times a day (BID) | ORAL | 2 refills | Status: DC
Start: 2017-10-01 — End: 2018-01-06

## 2017-10-01 MED ORDER — LORAZEPAM 2 MG/ML IJ SOLN
1.0000 mg | Freq: Once | INTRAMUSCULAR | Status: DC
  Filled 2017-10-01: qty 1

## 2017-10-01 MED ORDER — ONDANSETRON 4 MG PO TBDP: 4.0000 mg | ORAL_TABLET | Freq: Three times a day (TID) | ORAL | 0 refills | Status: DC | PRN

## 2017-10-01 NOTE — Consult Note (Signed)
Referring Physician: Dr. Dayna Barker    Chief Complaint: Acute onset of right sided weakness and tremor.   HPI: Ashley [name] is an 51 y.o. female who presents to the ED via EMS for assessment of acute onset of right sided weakness and tremor. She states that at about 1800 she walked into her living room and fell. She had LOC for a few seconds and on regaining consciousness was confused regarding her location and also noted right sided weakness with tremor. She was alert and oriented x 4 on arrival, appearing anxious with intermittent RUE tremor.   Her PMHx includes anxiety, panic attacks, bilateral cataracts, migraine headaches (approximately 3x/week), DM, poor oral health and tobacco abuse. She has no prior history of stroke.   Of note, she presented to her Family Medicine doctor today for further assessment of her depression, requesting a refill of her Venlafaxine, which she had not taken for 1.5 weeks. She also was requesting a refill for Clonazepam and CBGs were noted to be elevated with 14 day average CBG of 254.    LSN: 1800 tPA Given: No: NIHSS of 3 without language deficit and rapidly varying exam findings which are significantly more consistent with stress-related/non-physiological neurological deficits than with stroke.  Past Medical History:  Diagnosis Date  . Anxiety   . Bronchitis    uses inhaler if needed for bronchitis, lasted used -2014  . Cataract    bilateral  . Chronic headaches    migraines - in past, uses Phenergan for nausea   . Diabetes mellitus without complication ([place])    taken off Metformin since 04/2014, HgbA1C - normal, will follow up with PCP- Dr. Deborra Medina, 07/2014  . GERD (gastroesophageal reflux disease)   . H/O exercise stress test 2011   done at [facility]- told that it was WNL, done due to pt. having panic attacks   . History of blood transfusion 1966-11[birth year]11   at birth in [place], [place], unsure number of units  . Poor dentition    very poor oral health   . SVD  (spontaneous vaginal delivery)    x 3  . Tobacco abuse     Past Surgical History:  Procedure Laterality Date  . ABDOMINAL HYSTERECTOMY    . APPENDECTOMY  1988  . CARDIAC CATHETERIZATION N/A 08/17/2016   Procedure: Left Heart Cath and Coronary Angiography;  Surgeon: Burnell Blanks, MD;  Location: [facility];  Service: Cardiovascular;  Laterality: N/A;  . CHOLECYSTECTOMY N/A 06/07/2014   Procedure: LAPAROSCOPIC CHOLECYSTECTOMY;  Surgeon: Ralene Ok, MD;  Location: [facility];  Service: General;  Laterality: N/A;  . CHOLECYSTECTOMY  June 07 2014  . COLONOSCOPY  02/16/2014   normal   . ESOPHAGOGASTRODUODENOSCOPY  12/28/2013  . KNEE ARTHROSCOPY  1995   left  . LAPAROSCOPIC ASSISTED VAGINAL HYSTERECTOMY N/A 04/15/2014   Procedure: LAPAROSCOPIC ASSISTED VAGINAL HYSTERECTOMY;  Surgeon: Marylynn Pearson, MD;  Location: [facility];  Service: Gynecology;  Laterality: N/A;  . LAPAROSCOPIC BILATERAL SALPINGO OOPHERECTOMY Bilateral 04/15/2014   Procedure: LAPAROSCOPIC BILATERAL SALPINGO OOPHORECTOMY;  Surgeon: Marylynn Pearson, MD;  Location: [facility];  Service: Gynecology;  Laterality: Bilateral;  . TUBAL LIGATION      Family History  Problem Relation Age of Onset  . Diabetes Mother   . Hyperlipidemia Mother   . Hypertension Mother   . Anxiety disorder Mother   . Depression Mother   . Drug abuse Mother   . Prostate cancer Father   . Alcohol abuse Father   . Lung  cancer Maternal Grandfather        smoked  . Emphysema Maternal Grandfather        smoked  . COPD Maternal Grandmother        never smoked   . Dementia Maternal Grandmother   . Thyroid cancer Paternal Aunt   . Colon cancer Neg Hx   . Rectal cancer Neg Hx   . Stomach cancer Neg Hx   . Migraines Neg Hx    Social History:  reports that she has been smoking Cigarettes.  She has a 70.00 pack-year smoking history. She has never used smokeless tobacco. She reports that she does not drink alcohol or use drugs.  Allergies:   Allergies  Allergen Reactions  . Penicillins Anaphylaxis and Swelling    Throat swells Has patient had a PCN reaction causing immediate rash, facial/tongue/throat swelling, SOB or lightheadedness with hypotension: Yes Has patient had a PCN reaction causing severe rash involving mucus membranes or skin necrosis: No Has patient had a PCN reaction that required hospitalization: No Has patient had a PCN reaction occurring within the last 10 years: No If all of the above answers are "NO", then may proceed with Cephalosporin use.   . Topamax [Topiramate] Other (See Comments)    Hands and feet  go numb    Medications:  Prior to Admission:  (Not in a hospital admission) Scheduled: . LORazepam  1 mg Intravenous Once    ROS: Positive for right sided weakness and tremor. No headache. Other ROS as per HPI.   Physical Examination: Blood pressure 120/75, pulse 76, temperature 98.6 F (37 C), temperature source Oral, resp. rate 12, height 5\' 2"  (1.575 m), weight 74.3 kg (163 lb 12.8 oz), last menstrual period 02/12/2014, SpO2 99 %.  HEENT: [place]/AT. Poor dentition.  Lungs: Respirations unlabored Ext: Warm and well perfused  Neurologic Examination: Mental Status: Alert and fully oriented. Anxious affect. Speech fluent with intact comprehension, repetition and naming. Demonstrates full understanding of all commands and questions. Cranial Nerves: II:  Bilateral visual field constriction of all 4 quadrants of each eye. PERRL. III,IV, VI: EOMI without nystagmus. No ptosis.  V,VII: Smile symmetric. Facial temperature sensation subjectively diminished on the right.  VIII: hearing intact to conversation IX,X: no hypophonia or hoarseness XI: Symmetric XII: midline tongue extension Motor: RUE: Flapping motions noted when asked to hold antigravity. Giveway weakness noted, which improves to a maximum of 5/5 proximally and distally with coaching. Normal appearing spontaneous movements when distracted.    RLE: Tremulousness with flapping motions when holding antigravity. Giveway weakness noted, which improves to a maximum of 5/5 proximally and distally with coaching.  LUE and LLE: 5/5 Normal tone throughout; no atrophy noted Sensory: Subjectively decreased temperature sensation on the right. FT intact without extinction.  Deep Tendon Reflexes:  2+ bilateral brachioradialis and biceps. 3+ bilateral patellae and achilles reflexes.  Plantars: Right: downgoing  Left: downgoing Cerebellar: No ataxia with FNF bilaterally. Tremor of RUE transiently resolves during this portion of the exam.  Gait: Deferred due to acuity of presentation.   Results for orders placed or performed during the hospital encounter of 10/01/17 (from the past 48 hour(s))  I-stat troponin, ED     Status: None   Collection Time: 10/01/17  8:06 PM  Result Value Ref Range   Troponin i, poc 0.00 0.00 - 0.08 ng/mL   Comment 3            Comment: Due to the release kinetics of cTnI, a negative result  within the first hours of the onset of symptoms does not rule out myocardial infarction with certainty. If myocardial infarction is still suspected, repeat the test at appropriate intervals.   I-Stat Chem 8, ED     Status: Abnormal   Collection Time: 10/01/17  8:07 PM  Result Value Ref Range   Sodium 138 135 - 145 mmol/L   Potassium 3.7 3.5 - 5.1 mmol/L   Chloride 106 101 - 111 mmol/L   BUN 8 6 - 20 mg/dL   Creatinine, Ser 0.50 0.44 - 1.00 mg/dL   Glucose, Bld 221 (H) 65 - 99 mg/dL   Calcium, Ion 1.01 (L) 1.15 - 1.40 mmol/L   TCO2 22 22 - 32 mmol/L   Hemoglobin 12.2 12.0 - 15.0 g/dL   HCT 36.0 36.0 - 46.0 %   Ct Head Code Stroke Wo Contrast  Result Date: 10/01/2017 CLINICAL DATA:  Code stroke. Slurred speech and right-sided weakness. EXAM: CT HEAD WITHOUT CONTRAST TECHNIQUE: Contiguous axial images were obtained from the base of the skull through the vertex without intravenous contrast. COMPARISON:  04/25/2017 FINDINGS:  Brain: No evidence of acute infarction, hemorrhage, hydrocephalus, extra-axial collection or mass lesion/mass effect. Vascular: No hyperdense vessel or unexpected calcification. Skull: Normal. Negative for fracture or focal lesion. Sinuses/Orbits: Negative Other: Text page with results sent 10/01/2017 at 8:22 pm to Dr. Cheral Marker ASPECTS [facility] Stroke Program Early CT Score) - Ganglionic level infarction (caudate, lentiform nuclei, internal capsule, insula, M1-M3 cortex): 7 - Supraganglionic infarction (M4-M6 cortex): 3 Total score (0-10 with 10 being normal): 10 IMPRESSION: Negative head CT. ASPECTS is 10. Electronically Signed   By: Monte Fantasia M.D.   On: 10/01/2017 20:23    Assessment: 51 y.o. female presenting with acute onset of right sided weakness and tremor.  1. Per report, she has had similar presentations in the past with negative imaging.  2. Head CT this evening is negative.  3. IV tPA not indicated based on risk/benefit profile. NIHSS of 3 without language deficit. Rapidly varying exam findings are significantly more consistent with stress-related/non-physiological neurological deficits than with stroke. Discussed risks/benefits of tPA with patient who expressed agreement with plan not to proceed with tPA unless abnormalities indicative of stroke are seen on MRI.   4. Stroke Risk Factors - DM and tobacco abuse. 5. Hyperglycemia.   Plan: 1. STAT MRI brain. 2. Frequent neuro checks 3. Tobacco cessation counseling 4. ASA 81 mg po qd. Benefits for stroke and MI prevention outweigh relatively low risk of GIB and other side effects. 5. Consider discharging home with outpatient Neurology follow up if MRI brain is negative.   @Electronically  signed: Dr. Kerney Elbe  10/01/2017, 8:41 PM

## 2017-10-01 NOTE — Assessment & Plan Note (Signed)
Symptoms controlled. Refilled rxs but did advice that if she decompensates on current rxs, I will need the help of psychiatry. The patient indicates understanding of these issues and agrees with the plan.

## 2017-10-01 NOTE — Progress Notes (Signed)
Subjective:   Patient ID: Ashley [name], female    DOB: 04/29/1966, 51 y.o.   MRN: [id number]  Ashley [name] is a pleasant 51 y.o. year old female who presents to clinic today with Depression (Patient is here today to request that you continue her Venlafaxine.  She cannot afford to see the Psychiatrist and Therapist.  She has been without this medication for 1.5 weeks.  Is requesting a refill for Clonazepam as well.) and Blood Sugar Problem (Patient also states that her blood sugars have been elevated.  Her 14 day average is 254 and she feels that her Metformin is no longer effective.)  on 10/01/2017  HPI:  Can no longer afford to see a psychiatrist.  She is asking for me to take over prescribing her effexor and clonazepam.  Feels she has been stable on these two rxs.  DM- had been well controlled on Metformin 500 mg twice daily but her FSBS have been elevated in the high 200s.  Lab Results  Component Value Date   HGBA1C 7.2 (H) 08/06/2017    Current Outpatient Prescriptions on File Prior to Visit  Medication Sig Dispense Refill  . albuterol (PROVENTIL HFA;VENTOLIN HFA) 108 (90 Base) MCG/ACT inhaler Inhale 2 puffs into the lungs every 6 (six) hours as needed for wheezing or shortness of breath.    Marland Kitchen amitriptyline (ELAVIL) 50 MG tablet TAKE ONE AND ONE-HALF TABLET BY MOUTH AT DINNER TIME 90 tablet 3  . aspirin EC 81 MG tablet Take 81 mg by mouth daily.     . clonazePAM (KLONOPIN) 1 MG tablet Take 1 tablet (1 mg total) by mouth 2 (two) times daily. for anxiety 60 tablet 0  . dicyclomine (BENTYL) 10 MG capsule TAKE ONE CAPSULE BY MOUTH TWICE DAILY AS NEEDED FOR  CRAMPING  AND  ABDOMINAL  PAIN 180 capsule 1  . docusate sodium (COLACE) 100 MG capsule Take 100 mg by mouth at bedtime.    . metFORMIN (GLUCOPHAGE) 500 MG tablet Take 1 tablet (500 mg total) by mouth 2 (two) times daily with a meal. 180 tablet 3  . ondansetron (ZOFRAN ODT) 4 MG disintegrating tablet Take 1 tablet (4 mg total) by mouth  every 8 (eight) hours as needed for nausea. 6 tablet 0  . pantoprazole (PROTONIX) 20 MG tablet Take 1 tablet (20 mg total) by mouth 2 (two) times daily. 60 tablet 3  . promethazine (PHENERGAN) 25 MG tablet Take 1 tablet (25 mg total) by mouth every 6 (six) hours as needed for nausea or vomiting. 30 tablet 0  . rizatriptan (MAXALT-MLT) 10 MG disintegrating tablet Take 1 tablet (10 mg total) by mouth as needed for migraine. May repeat in 2 hours if needed. Do not take more than 3 tablets in a week. 10 tablet 3  . traZODone (DESYREL) 150 MG tablet Take 1 tablet (150 mg total) by mouth at bedtime. 90 tablet 3  . venlafaxine (EFFEXOR) 37.5 MG tablet Take 37.5 mg by mouth 2 (two) times daily.     No current facility-administered medications on file prior to visit.     Allergies  Allergen Reactions  . Penicillins Anaphylaxis and Swelling    Throat swells Has patient had a PCN reaction causing immediate rash, facial/tongue/throat swelling, SOB or lightheadedness with hypotension: Yes Has patient had a PCN reaction causing severe rash involving mucus membranes or skin necrosis: No Has patient had a PCN reaction that required hospitalization: No Has patient had a PCN reaction occurring within the  last 10 years: No If all of the above answers are "NO", then may proceed with Cephalosporin use.   . Topamax [Topiramate] Other (See Comments)    Hands and feet  go numb    Past Medical History:  Diagnosis Date  . Anxiety   . Bronchitis    uses inhaler if needed for bronchitis, lasted used -2014  . Cataract    bilateral  . Chronic headaches    migraines - in past, uses Phenergan for nausea   . Diabetes mellitus without complication ([place])    taken off Metformin since 04/2014, HgbA1C - normal, will follow up with PCP- Dr. Deborra Medina, 07/2014  . GERD (gastroesophageal reflux disease)   . H/O exercise stress test 2011   done at [facility]- told that it was WNL, done due to pt. having panic attacks   .  History of blood transfusion 1966-10-19   at birth in [place], [place], unsure number of units  . Poor dentition    very poor oral health   . SVD (spontaneous vaginal delivery)    x 3  . Tobacco abuse     Past Surgical History:  Procedure Laterality Date  . ABDOMINAL HYSTERECTOMY    . APPENDECTOMY  1988  . CARDIAC CATHETERIZATION N/A 08/17/2016   Procedure: Left Heart Cath and Coronary Angiography;  Surgeon: Burnell Blanks, MD;  Location: [facility];  Service: Cardiovascular;  Laterality: N/A;  . CHOLECYSTECTOMY N/A 06/07/2014   Procedure: LAPAROSCOPIC CHOLECYSTECTOMY;  Surgeon: Ralene Ok, MD;  Location: [place];  Service: General;  Laterality: N/A;  . CHOLECYSTECTOMY  June 07 2014  . COLONOSCOPY  02/16/2014   normal   . ESOPHAGOGASTRODUODENOSCOPY  12/28/2013  . KNEE ARTHROSCOPY  1995   left  . LAPAROSCOPIC ASSISTED VAGINAL HYSTERECTOMY N/A 04/15/2014   Procedure: LAPAROSCOPIC ASSISTED VAGINAL HYSTERECTOMY;  Surgeon: Marylynn Pearson, MD;  Location: [facility];  Service: Gynecology;  Laterality: N/A;  . LAPAROSCOPIC BILATERAL SALPINGO OOPHERECTOMY Bilateral 04/15/2014   Procedure: LAPAROSCOPIC BILATERAL SALPINGO OOPHORECTOMY;  Surgeon: Marylynn Pearson, MD;  Location: [facility];  Service: Gynecology;  Laterality: Bilateral;  . TUBAL LIGATION      Family History  Problem Relation Age of Onset  . Diabetes Mother   . Hyperlipidemia Mother   . Hypertension Mother   . Anxiety disorder Mother   . Depression Mother   . Drug abuse Mother   . Prostate cancer Father   . Alcohol abuse Father   . Lung cancer Maternal Grandfather        smoked  . Emphysema Maternal Grandfather        smoked  . COPD Maternal Grandmother        never smoked   . Dementia Maternal Grandmother   . Thyroid cancer Paternal Aunt   . Colon cancer Neg Hx   . Rectal cancer Neg Hx   . Stomach cancer Neg Hx   . Migraines Neg Hx     Social History   Social History  . Marital status: Married    Spouse  name: Alvester Chou  . Number of children: 6  . Years of education: 76   Occupational History  .       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 Smoking status: Current Every Day Smoker    Packs/day: 2.00    Years: 35.00    Types: Cigarettes  . Smokeless tobacco: Never Used     Comment: 08-17-17- pt reports smokes "almost 2  packs a day"  . Alcohol use No  . Drug use: No  . Sexual activity: Yes    Partners: Male    Birth control/ protection: Surgical   Other Topics Concern  . Not on file   Social History Narrative   5 living children, one child is a crack cocaine addict who often breaks into their house to steal money. One child died of cerebral palsy.   Caffeine use: Dr Malachi Bonds (3 per day)   2 cups coffee per day   The PMH, PSH, Social History, Family History, Medications, and allergies have been reviewed in [facility], and have been updated if relevant.   Review of Systems  Constitutional: Negative.   HENT: Negative.   Respiratory: Negative.   Cardiovascular: Negative.   Gastrointestinal: Negative.   Genitourinary: Negative.   Musculoskeletal: Negative.   Neurological: Negative.   Hematological: Negative.   Psychiatric/Behavioral: Negative.   All other systems reviewed and are negative.      Objective:    BP 122/74 (BP Location: Right Arm, Patient Position: Sitting, Cuff Size: Normal)   Pulse 86   Temp 98 F (36.7 C) (Oral)   Ht 5\' 2"  (1.575 m)   Wt 163 lb (73.9 kg)   LMP 02/12/2014   SpO2 96%   BMI 29.81 kg/m    Physical Exam  Constitutional: She is oriented to person, place, and time. She appears well-developed and well-nourished. No distress.  HENT:  Head: Normocephalic and atraumatic.  Eyes: Conjunctivae are normal.  Cardiovascular: Normal rate.   Pulmonary/Chest: Effort normal.  Musculoskeletal: Normal range of motion.  Neurological: She is alert and oriented to person, place, and time. No cranial nerve deficit.  Skin: Skin is warm and  dry. She is not diaphoretic.  Psychiatric: She has a normal mood and affect. Her behavior is normal. Judgment and thought content normal.  Nursing note and vitals reviewed.         Assessment & Plan:   Moderate episode of recurrent major depressive disorder (HCC)  Type 2 diabetes mellitus without complication, without long-term current use of insulin (HCC) No Follow-up on file.

## 2017-10-01 NOTE — ED Triage Notes (Signed)
Pt from home via [facility]. Stated that around 1800 walked into living room & fell. LOC for a few seconds & wasn't able to determine where she was. R sided deficits, but no other s/s @ this time. Pt A&O x4 on arrival.

## 2017-10-01 NOTE — Patient Instructions (Signed)
Great to see you.  We are referring you to a diabetic nutritionist. Someone will call you with this appointment

## 2017-10-01 NOTE — Assessment & Plan Note (Signed)
Deteriorated. Discussed with pt- she is leery about increasing metformin and admits to not eating a diabetic friendly diet. Agreed to see diabetic nutritionist- referral placed. No changes made to rxs for now.

## 2017-10-02 ENCOUNTER — Telehealth: Payer: Self-pay

## 2017-10-02 MED ORDER — ACETAMINOPHEN 500 MG PO TABS: 1000.0000 mg | ORAL_TABLET | Freq: Once | ORAL | Status: DC

## 2017-10-02 NOTE — Telephone Encounter (Signed)
PLEASE NOTE: All timestamps contained within this report are represented as [place] Standard Time. CONFIDENTIALTY NOTICE: This fax transmission is intended only for the addressee. It contains information that is legally privileged, confidential or otherwise protected from use or disclosure. If you are not the intended recipient, you are strictly prohibited from reviewing, disclosing, copying using or disseminating any of this information or taking any action in reliance on or regarding this information. If you have received this fax in error, please notify us immediately by telephone so that we can arrange for its return to [place]. Phone: [phone], Toll-Free: [phone], Fax: [phone] Page: 1 of 2 Call Id: [id number] [place] Patient Name: Azhar Hawkes Gender: Female DOB: May 03, 1966 Age: 51 Y 63 M 18 D Return Phone Number: [phone] (Primary), [phone] (Secondary) Address: [place]/State/Zip: [place] [address] Client [facility] Night - Client Client Site [place] Physician Arnette Norris - MD Contact Type Call Who Is Calling Patient / Member / Family / Caregiver Call Type Triage / Clinical Relationship To Patient Self Return Phone Number [phone] (Primary) Chief Complaint NUMBNESS/TINGLING- sudden on one side of the body or face Reason for Call Symptomatic / Request for [place] stated she fell and her right side is numb from the shoulder down. Once she was up off the floor, her BP was 126/81 and sugar was 205 at 6PM. Translation No Nurse Assessment Nurse: Terance Hart, RN, Doug Date/Time (Eastern Time): 10/01/2017 6:57:43 PM Confirm and document reason for call. If symptomatic, describe symptoms. ---caller states that she fell in the living room , was helped off of the floor ,  blood pressure was normal , sugar is 205. When getting up off of the floor she could not feel her feet . Now she has numbness on the right side of her body , for about one hour . Does the patient have any new or worsening symptoms? ---Yes Will a triage be completed? ---Yes Related visit to physician within the last 2 weeks? ---Yes Does the PT have any chronic conditions? (i.e. diabetes, asthma, etc.) ---Yes List chronic conditions. ---diabetes , chronic migraines Is the patient pregnant or possibly pregnant? (Ask all females between the ages of 48-55) ---No Is this a behavioral health or substance abuse call? ---No Guidelines Guideline Title Affirmed Question Affirmed Notes Nurse Date/Time (Eastern Time) Neurologic Deficit [1] Numbness (i.e., loss of sensation) of the face, arm / hand, or leg / foot on one side of the body AND [2] sudden onset AND [3] present now Terance Hart, Therapist, sports, Doug 10/01/2017 7:03:25 PM PLEASE NOTE: All timestamps contained within this report are represented as [place] Standard Time. CONFIDENTIALTY NOTICE: This fax transmission is intended only for the addressee. It contains information that is legally privileged, confidential or otherwise protected from use or disclosure. If you are not the intended recipient, you are strictly prohibited from reviewing, disclosing, copying using or disseminating any of this information or taking any action in reliance on or regarding this information. If you have received this fax in error, please notify us immediately by telephone so that we can arrange for its return to [place]. Phone: [phone], Toll-Free: [phone], Fax: [phone] Page: 2 of 2 Call Id: [id number] [place]. Time Eilene Ghazi Time) Disposition Final User 10/01/2017 6:55:35 PM Send to Urgent Queue Faylene Kurtz 10/01/2017 7:09:11 PM Send To RN Personal Terance Hart, RN, Doug 10/01/2017 7:15:01  PM 911 Outcome Documentation Terance Hart, RN, Marden Noble Reason: caller states that her husband  is helping her get some clothes together , she is calling 911 now . 10/01/2017 7:15:49 PM Call Completed Terance Hart, RN, Doug 10/01/2017 7:07:15 PM Call EMS 911 Now Yes Terance Hart, RN, Adora Fridge Disagree/Comply Comply Caller Understands Yes PreDisposition Did not know what to do Care Advice Given Per Guideline CALL EMS 911 NOW: Immediate medical attention is needed. You need to hang up and call 911 (or an ambulance). Psychologist, forensic Discretion: I'll call you back in a few minutes to be sure you were able to reach them.) CARE ADVICE given per Neurologic Deficit (Adult) guideline. Comments User: Windell Moulding, RN Date/Time Eilene Ghazi Time): 10/01/2017 7:07:10 PM 911 disposition Referrals GO TO FACILITY UNDECIDED

## 2017-10-02 NOTE — Telephone Encounter (Signed)
Per chart review tab pt went to [facility] ED on 10/01/17.

## 2017-10-02 NOTE — ED Provider Notes (Signed)
[facility] DEPT Provider Note   CSN: [id number] Arrival date & time: 10/01/17  2000     History   Chief Complaint Chief Complaint  Patient presents with  . Code Stroke    HPI Ashley [name] is a 51 y.o. female.   Neurologic Problem  This is a new problem. The current episode started 1 to 2 hours ago. The problem occurs constantly. The problem has not changed since onset.Associated symptoms include headaches. Pertinent negatives include no chest pain. Nothing aggravates the symptoms. Nothing relieves the symptoms. She has tried nothing for the symptoms.    Past Medical History:  Diagnosis Date  . Anxiety   . Bronchitis    uses inhaler if needed for bronchitis, lasted used -2014  . Cataract    bilateral  . Chronic headaches    migraines - in past, uses Phenergan for nausea   . Diabetes mellitus without complication ([place])    taken off Metformin since 04/2014, HgbA1C - normal, will follow up with PCP- Dr. Deborra Medina, 07/2014  . GERD (gastroesophageal reflux disease)   . H/O exercise stress test 2011   done at [facility]- told that it was WNL, done due to pt. having panic attacks   . History of blood transfusion 18-Apr-1966   at birth in [place], [place], unsure number of units  . Poor dentition    very poor oral health   . SVD (spontaneous vaginal delivery)    x 3  . Tobacco abuse     Patient Active Problem List   Diagnosis Date Noted  . Abdominal pain 09/12/2017  . Constipation 09/12/2017  . Dizziness 07/09/2017  . Hypokalemia 05/23/2017  . Moderate episode of recurrent major depressive disorder ([place]) 05/16/2017  . Generalized weakness 04/25/2017  . Bilateral leg numbness 04/25/2017  . Diabetes mellitus with complication ([place])   . 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 Paresthesia 03/04/2017  . Hospital discharge follow-up 02/19/2017  . Tobacco abuse 01/08/2017  . Diabetes ([place]) 12/26/2016  . Chronic bronchitis ([place]) 10/20/2015  . HLD (hyperlipidemia) 04/12/2015  . Migraine  with aura, intractable 08/12/2014  . S/P hysterectomy with oophorectomy 04/15/2014  . Hyperglycemia 01/26/2013  . Migraine 05/30/2010  . Anxiety state 09/30/2009  . INSOMNIA, CHRONIC 09/30/2009  . ADJUSTMENT DISORDER WITH MIXED FEATURES 09/30/2009  . ELEVATED BLOOD PRESSURE WITHOUT DIAGNOSIS OF HYPERTENSION 09/30/2009    Past Surgical History:  Procedure Laterality Date  . ABDOMINAL HYSTERECTOMY    . APPENDECTOMY  1988  . CARDIAC CATHETERIZATION N/A 08/17/2016   Procedure: Left Heart Cath and Coronary Angiography;  Surgeon: Burnell Blanks, MD;  Location: [facility];  Service: Cardiovascular;  Laterality: N/A;  . CHOLECYSTECTOMY N/A 06/07/2014   Procedure: LAPAROSCOPIC CHOLECYSTECTOMY;  Surgeon: Ralene Ok, MD;  Location: [facility];  Service: General;  Laterality: N/A;  . CHOLECYSTECTOMY  June 07 2014  . COLONOSCOPY  02/16/2014   normal   . ESOPHAGOGASTRODUODENOSCOPY  12/28/2013  . KNEE ARTHROSCOPY  1995   left  . LAPAROSCOPIC ASSISTED VAGINAL HYSTERECTOMY N/A 04/15/2014   Procedure: LAPAROSCOPIC ASSISTED VAGINAL HYSTERECTOMY;  Surgeon: Marylynn Pearson, MD;  Location: [facility];  Service: Gynecology;  Laterality: N/A;  . LAPAROSCOPIC BILATERAL SALPINGO OOPHERECTOMY Bilateral 04/15/2014   Procedure: LAPAROSCOPIC BILATERAL SALPINGO OOPHORECTOMY;  Surgeon: Marylynn Pearson, MD;  Location: [facility];  Service: Gynecology;  Laterality: Bilateral;  . TUBAL LIGATION      OB History    No data available       Home Medications    Prior  to Admission medications   Medication Sig Start Date End Date Taking? Authorizing Provider  albuterol (PROVENTIL HFA;VENTOLIN HFA) 108 (90 Base) MCG/ACT inhaler Inhale 2 puffs into the lungs every 6 (six) hours as needed for wheezing or shortness of breath.   Yes [provider]  amitriptyline (ELAVIL) 50 MG tablet TAKE ONE AND ONE-HALF TABLET BY MOUTH AT DINNER TIME Patient taking differently: Take 75 mg by mouth at bedtime.  09/12/17   Yes Lucille Passy, MD  aspirin EC 81 MG tablet Take 81 mg by mouth daily.    Yes [provider]  clonazePAM (KLONOPIN) 1 MG tablet Take 1 tablet (1 mg total) by mouth 2 (two) times daily. for anxiety Patient taking differently: Take 1 mg by mouth 2 (two) times daily.  10/01/17  Yes Lucille Passy, MD  dicyclomine (BENTYL) 10 MG capsule TAKE ONE CAPSULE BY MOUTH TWICE DAILY AS NEEDED FOR  CRAMPING  AND  ABDOMINAL  PAIN Patient taking differently: Take 10 mg by mouth two times a day as needed for cramping and abdominal pain 10/05/16  Yes Lucille Passy, MD  docusate sodium (COLACE) 100 MG capsule Take 100 mg by mouth at bedtime.   Yes [provider]  metFORMIN (GLUCOPHAGE) 500 MG tablet Take 1 tablet (500 mg total) by mouth 2 (two) times daily with a meal. Patient taking differently: Take 1,000 mg by mouth daily with breakfast.  08/12/17  Yes Lucille Passy, MD  ondansetron (ZOFRAN ODT) 4 MG disintegrating tablet Take 1 tablet (4 mg total) by mouth every 8 (eight) hours as needed for nausea. Patient taking differently: Take 4 mg by mouth every 8 (eight) hours as needed for nausea. DISSOLVE IN THE MOUTH 10/01/17  Yes Lucille Passy, MD  pantoprazole (PROTONIX) 20 MG tablet Take 1 tablet (20 mg total) by mouth 2 (two) times daily. 09/12/17  Yes Lucille Passy, MD  promethazine (PHENERGAN) 25 MG tablet Take 1 tablet (25 mg total) by mouth every 6 (six) hours as needed for nausea or vomiting. 07/17/17  Yes Lucille Passy, MD  rizatriptan (MAXALT-MLT) 10 MG disintegrating tablet Take 1 tablet (10 mg total) by mouth as needed for migraine. May repeat in 2 hours if needed. Do not take more than 3 tablets in a week. 07/17/17  Yes Lucille Passy, MD  traZODone (DESYREL) 150 MG tablet Take 1 tablet (150 mg total) by mouth at bedtime. 08/12/17  Yes Lucille Passy, MD  venlafaxine (EFFEXOR) 37.5 MG tablet Take 1 tablet (37.5 mg total) by mouth 2 (two) times daily. Patient taking differently: Take 75 mg by  mouth every morning.  10/01/17  Yes Lucille Passy, MD  BAYER CONTOUR TEST test strip Testing for up to tid 09/19/17   [provider]  Lancet Devices (ADJUSTABLE LANCING DEVICE) MISC Up to tid testing 09/19/17   [provider]    Family History Family History  Problem Relation Age of Onset  . Diabetes Mother   . Hyperlipidemia Mother   . Hypertension Mother   . Anxiety disorder Mother   . Depression Mother   . Drug abuse Mother   . Prostate cancer Father   . Alcohol abuse Father   . Lung cancer Maternal Grandfather        smoked  . Emphysema Maternal Grandfather        smoked  . COPD Maternal Grandmother        never smoked   . Dementia Maternal Grandmother   .  Thyroid cancer Paternal Aunt   . Colon cancer Neg Hx   . Rectal cancer Neg Hx   . Stomach cancer Neg Hx   . Migraines Neg Hx     Social History Social History  Substance Use Topics  . Smoking status: Current Every Day Smoker    Packs/day: 2.00    Years: 35.00    Types: Cigarettes  . Smokeless tobacco: Never Used     Comment: 08-17-17- pt reports smokes "almost 2 packs a day"  . Alcohol use No     Allergies   Penicillins and Topamax [topiramate]   Review of Systems Review of Systems  Cardiovascular: Negative for chest pain.  Neurological: Positive for weakness, numbness and headaches.  All other systems reviewed and are negative.    Physical Exam Updated Vital Signs BP 107/60   Pulse 72   Temp 98.6 F (37 C) (Oral)   Resp (!) 22   Ht 5\' 2"  (1.575 m)   Wt 74.3 kg (163 lb 12.8 oz)   LMP 02/12/2014   SpO2 98%   BMI 29.96 kg/m   Physical Exam  Constitutional: She is oriented to person, place, and time. She appears well-developed and well-nourished.  HENT:  Head: Normocephalic and atraumatic.  Eyes: Conjunctivae and EOM are normal.  Neck: Normal range of motion.  Cardiovascular: Normal rate and regular rhythm.   Pulmonary/Chest: Effort normal and breath sounds normal. No  stridor. No respiratory distress.  Abdominal: Soft. She exhibits no distension.  Musculoskeletal: Normal range of motion. She exhibits no edema or deformity.  Neurological: She is alert and oriented to person, place, and time. No cranial nerve deficit. Coordination normal.  No altered mental status, able to give full seemingly accurate history.  Face is symmetric, EOM's intact, pupils equal and reactive, vision intact, tongue and uvula midline without deviation. Upper and Lower extremity motor 5/5 (initially won't cooperate but after multiple attempts and telling her 'I know you can do this' she has normal strength), intact pain perception in distal extremities, 2+ reflexes in biceps, patella and achilles tendons. Able to perform finger to nose normal with both hands. Walks without assistance or evident ataxia.    Skin: Skin is warm and dry.  Nursing note and vitals reviewed.    ED Treatments / Results  Labs (all labs ordered are listed, but only abnormal results are displayed) Labs Reviewed  CBC WITH DIFFERENTIAL/PLATELET - Abnormal; Notable for the following:       Result Value   RBC 3.83 (*)    Hemoglobin 11.6 (*)    HCT 35.4 (*)    Platelets 401 (*)    All other components within normal limits  COMPREHENSIVE METABOLIC PANEL - Abnormal; Notable for the following:    Potassium 3.3 (*)    Glucose, Bld 198 (*)    Calcium 8.7 (*)    Total Protein 6.3 (*)    Albumin 3.2 (*)    All other components within normal limits  I-STAT CHEM 8, ED - Abnormal; Notable for the following:    Glucose, Bld 221 (*)    Calcium, Ion 1.01 (*)    All other components within normal limits  I-STAT BETA HCG BLOOD, ED ([facility], [facility], [facility] ONLY) - Abnormal; Notable for the following:    I-stat hCG, quantitative 6.1 (*)    All other components within normal limits  ETHANOL  I-STAT TROPONIN, ED    EKG  EKG Interpretation  Date/Time:  Tuesday October 01 2017 20:26:53 EDT Ventricular  Rate:  75 PR Interval:      QRS Duration: 84 QT Interval:  389 QTC Calculation: 435 R Axis:   10 Text Interpretation:  Sinus rhythm Normal ECG Confirmed by Veryl Speak [phone]) on 10/02/2017 7:15:59 PM       Radiology Mr Brain Wo Contrast  Result Date: 10/01/2017 CLINICAL DATA:  Initial evaluation for possible stroke, right-sided deficits. Fall. EXAM: MRI HEAD WITHOUT CONTRAST TECHNIQUE: Multiplanar, multiecho pulse sequences of the brain and surrounding structures were obtained without intravenous contrast. COMPARISON:  Prior CT from earlier the same day. FINDINGS: Brain: Cerebral volume within normal limits for age. No focal parenchymal signal abnormality identified. No abnormal foci of restricted diffusion to suggest acute or subacute ischemia. Gray-white matter differentiation well maintained. No encephalomalacia to suggest chronic infarction. No foci of susceptibility artifact to suggest acute or chronic intracranial hemorrhage. No mass lesion, midline shift or mass effect. Ventricles normal in size without evidence for hydrocephalus. No extra-axial fluid collection. Pituitary gland suprasellar region normal. Midline structures intact and normal. Vascular: Major intracranial vascular flow voids are maintained. Skull and upper cervical spine: Craniocervical junction normal. Upper cervical spine within normal limits. Bone marrow signal intensity normal. No scalp soft tissue abnormality. Sinuses/Orbits: Globes and orbital soft tissues within normal limits. Paranasal sinuses are clear. Trace right mastoid effusion noted, of doubtful significance. Inner ear structures normal. Other: None. IMPRESSION: Normal MRI of the brain. No acute intracranial abnormality identified. Electronically Signed   By: Jeannine Boga M.D.   On: 10/01/2017 23:32   Ct Head Code Stroke Wo Contrast  Result Date: 10/01/2017 CLINICAL DATA:  Code stroke. Slurred speech and right-sided weakness. EXAM: CT HEAD WITHOUT CONTRAST TECHNIQUE: Contiguous  axial images were obtained from the base of the skull through the vertex without intravenous contrast. COMPARISON:  04/25/2017 FINDINGS: Brain: No evidence of acute infarction, hemorrhage, hydrocephalus, extra-axial collection or mass lesion/mass effect. Vascular: No hyperdense vessel or unexpected calcification. Skull: Normal. Negative for fracture or focal lesion. Sinuses/Orbits: Negative Other: Text page with results sent 10/01/2017 at 8:22 pm to Dr. Cheral Marker ASPECTS [facility] Stroke Program Early CT Score) - Ganglionic level infarction (caudate, lentiform nuclei, internal capsule, insula, M1-M3 cortex): 7 - Supraganglionic infarction (M4-M6 cortex): 3 Total score (0-10 with 10 being normal): 10 IMPRESSION: Negative head CT. ASPECTS is 10. Electronically Signed   By: Monte Fantasia M.D.   On: 10/01/2017 20:23    Procedures Procedures (including critical care time)  Medications Ordered in ED Medications - No data to display   Initial Impression / Assessment and Plan / ED Course  I have reviewed the triage vital signs and the nursing notes.  Pertinent labs & imaging results that were available during my care of the patient were reviewed by me and considered in my medical decision making (see chart for details).  Code stroke initiated secondary to acute neurologic concern. However patient seems to have some malingering type intention. She states she can't move her right foot however she actively resists with brisk movement of her foot. Same with her right hand. Neurology saw her felt the same. MRI done just to ensure there was no evidence of infarct and was negative. Patient stable for discharge.  She is upset with discharge as it took 5 hours for her to get her workup done and to go home.  Final Clinical Impressions(s) / ED Diagnoses   Final diagnoses:  Paresthesia     Denaisha Swango, Corene Cornea, MD 10/02/17 [phone]

## 2017-10-06 NOTE — Telephone Encounter (Signed)
Yes, I will see her 

## 2017-10-11 NOTE — Telephone Encounter (Signed)
LVM to inform patient.   RE: Please set patient up a transfer care appointment with Dr.Jones when she calls back.

## 2017-10-11 NOTE — Telephone Encounter (Signed)
Patient has set up an appointment for 12/30/17

## 2017-10-15 ENCOUNTER — Encounter: Payer: BLUE CROSS/BLUE SHIELD | Attending: Family Medicine | Admitting: Dietician

## 2017-10-15 ENCOUNTER — Encounter: Payer: Self-pay | Admitting: Dietician

## 2017-10-15 DIAGNOSIS — E119 Type 2 diabetes mellitus without complications: Secondary | ICD-10-CM | POA: Insufficient documentation

## 2017-10-15 DIAGNOSIS — Z713 Dietary counseling and surveillance: Secondary | ICD-10-CM | POA: Insufficient documentation

## 2017-10-15 DIAGNOSIS — E118 Type 2 diabetes mellitus with unspecified complications: Secondary | ICD-10-CM

## 2017-10-15 NOTE — Progress Notes (Signed)
Patient was seen on 10/15/17 for the first of a series of three diabetes self-management courses at [facility].  Patient Education Plan per assessed needs and concerns is to attend four course education program for Diabetes Self Management Education.  The following learning objectives were met by the patient during this class:  Describe diabetes  State some common risk factors for diabetes  Defines the role of glucose and insulin  Identifies type of diabetes and pathophysiology  Describe the relationship between diabetes and cardiovascular risk  State the members of the Healthcare Team  States the rationale for glucose monitoring  State when to test glucose  State their individual Target Range  State the importance of logging glucose readings  Describe how to interpret glucose readings  Identifies A1C target  Explain the correlation between A1c and eAG values  State symptoms and treatment of high blood glucose  State symptoms and treatment of low blood glucose  Explain proper technique for glucose testing  Identifies proper sharps disposal  Handouts given during class include:  Living Well with Diabetes book  Carb Counting and Meal Planning book  Meal Plan Card  Carbohydrate guide  Meal planning worksheet  Low Sodium Flavoring Tips  The diabetes portion plate  C6F to eAG Conversion Chart  Diabetes Medications  Diabetes Recommended Care Schedule  Support Group  Diabetes Success Plan  Core Class Satisfaction Survey   Follow-Up Plan:  Attend core 2

## 2017-10-22 ENCOUNTER — Ambulatory Visit: Payer: Self-pay

## 2017-10-29 ENCOUNTER — Ambulatory Visit: Payer: Self-pay

## 2017-11-04 ENCOUNTER — Other Ambulatory Visit: Payer: Self-pay | Admitting: Family Medicine

## 2017-11-04 MED ORDER — CLONAZEPAM 1 MG PO TABS: 1.0000 mg | ORAL_TABLET | Freq: Two times a day (BID) | ORAL | 0 refills | Status: DC

## 2017-11-04 NOTE — Telephone Encounter (Signed)
Copied from [place] [id number]. Topic: Quick Communication - See Telephone Encounter >> Nov 04, 2017 10:15 AM Aurelio Brash B wrote: CRM for notification. See Telephone encounter for:  11/04/17. Refill clonazepam

## 2017-11-04 NOTE — Telephone Encounter (Signed)
Faxed to pharmacy/thx dmf

## 2017-11-04 NOTE — Telephone Encounter (Signed)
Requesting refill clonazepam;pt last seen and rx last refilled # 60 on 10/01/17; UDS done 05/23/17.

## 2017-11-04 NOTE — Telephone Encounter (Signed)
Sent to office because pt says there are no refills, routing again to nurse triage

## 2017-11-20 ENCOUNTER — Ambulatory Visit (INDEPENDENT_AMBULATORY_CARE_PROVIDER_SITE_OTHER): Payer: BLUE CROSS/BLUE SHIELD | Admitting: Neurology

## 2017-11-20 ENCOUNTER — Telehealth: Payer: Self-pay | Admitting: Neurology

## 2017-11-20 ENCOUNTER — Encounter: Payer: Self-pay | Admitting: Neurology

## 2017-11-20 VITALS — BP 132/73 | HR 76

## 2017-11-20 DIAGNOSIS — G43711 Chronic migraine without aura, intractable, with status migrainosus: Secondary | ICD-10-CM | POA: Diagnosis not present

## 2017-11-20 MED ORDER — FREMANEZUMAB-VFRM 225 MG/1.5ML ~~LOC~~ SOSY: 1.0000 | PREFILLED_SYRINGE | SUBCUTANEOUS | 0 refills | Status: DC

## 2017-11-20 MED ORDER — FREMANEZUMAB-VFRM 225 MG/1.5ML ~~LOC~~ SOSY: 1.0000 | PREFILLED_SYRINGE | SUBCUTANEOUS | 11 refills | Status: DC

## 2017-11-20 MED ORDER — FREMANEZUMAB-VFRM 225 MG/1.5ML ~~LOC~~ SOSY: 225.0000 mg | PREFILLED_SYRINGE | SUBCUTANEOUS | 11 refills | Status: DC

## 2017-11-20 NOTE — Progress Notes (Signed)
Botox-100 units x 2 vials Lot: [id number] Expiration: 05/2020 NDC: 2897-9150-41 Dx: J64.383 B/B  Bacteriostatic 0.9% Sodium Chloride- 42mL  Lot: [id number] Expiration: 04/24/2019 NDC: 8864-8472-07 //BCrn

## 2017-11-20 NOTE — Progress Notes (Signed)
Interval history: Patient doing exceptionally well.  Consent Form Botulism Toxin Injection For Chronic Migraine  Botulism toxin has been approved by the Federal drug administration for treatment of chronic migraine. Botulism toxin does not cure chronic migraine and it may not be effective in some patients.  The administration of botulism toxin is accomplished by injecting a small amount of toxin into the muscles of the neck and head. Dosage must be titrated for each individual. Any benefits resulting from botulism toxin tend to wear off after 3 months with a repeat injection required if benefit is to be maintained. Injections are usually done every 3-4 months with maximum effect peak achieved by about 2 or 3 weeks. Botulism toxin is expensive and you should be sure of what costs you will incur resulting from the injection.  The side effects of botulism toxin use for chronic migraine may include:   -Transient, and usually mild, facial weakness with facial injections  -Transient, and usually mild, head or neck weakness with head/neck injections  -Reduction or loss of forehead facial animation due to forehead muscle              weakness  -Eyelid drooping  -Dry eye  -Pain at the site of injection or bruising at the site of injection  -Double vision  -Potential unknown long term risks  Contraindications: You should not have Botox if you are pregnant, nursing, allergic to albumin, have an infection, skin condition, or muscle weakness at the site of the injection, or have myasthenia gravis, Lambert-Eaton syndrome, or ALS.  It is also possible that as with any injection, there may be an allergic reaction or no effect from the medication. Reduced effectiveness after repeated injections is sometimes seen and rarely infection at the injection site may occur. All care will be taken to prevent these side effects. If therapy is given over a long time, atrophy and wasting in the muscle injected may occur.  Occasionally the patient's become refractory to treatment because they develop antibodies to the toxin. In this event, therapy needs to be modified.  I have read the above information and consent to the administration of botulism toxin.    ______________  _____   _________________  Patient signature     Date   Witness signature       BOTOX PROCEDURE NOTE FOR MIGRAINE HEADACHE  Contraindications and precautions discussed with patient(above). Aseptic procedure was observed and patient tolerated procedure. Procedure performed by Dr. Georgia Dom  The condition has existed for more than 6 months, and pt does not have a diagnosis of ALS, Myasthenia Gravis or Lambert-Eaton Syndrome. Risks and benefits of injections discussed and pt agrees to proceed with the procedure. Written consent obtained  These injections are medically necessary. He receives good benefits from these injections. These injections do not cause sedations or hallucinations which the oral therapies may cause.   Description of procedure:  The patient was placed in a sitting position. The standard protocol was used for Botox as follows, with 5 units of Botox injected at each site:   -Procerus muscle, midline injection  -Corrugator muscle, bilateral injection  -Frontalis muscle, bilateral injection, with 2 sites each side, medial injection was performed in the upper one third of the frontalis muscle, in the region vertical from the medial inferior edge of the superior orbital rim. The lateral injection was again in the upper one third of the forehead vertically above the lateral limbus of the cornea, 1.5 cm lateral to the medial  injection site.  -Temporalis muscle injection, 4 sites, bilaterally. The first injection was 3 cm above the tragus of the ear, second injection site was 1.5 cm to 3 cm up from the first injection site in line with the tragus of the ear. The third injection site was 1.5-3 cm forward between the first 2  injection sites. The fourth injection site was 1.5 cm posterior to the second injection site.  -Occipitalis muscle injection, 3 sites, bilaterally. The first injection was done one half way between the occipital protuberance and the tip of the mastoid process behind the ear. The second injection site was done lateral and superior to the first, 1 fingerbreadth from the first injection. The third injection site was 1 fingerbreadth superiorly and medially from the first injection site.  -Cervical paraspinal muscle injection, 2 sites, bilateral knee first injection site was 1 cm from the midline of the cervical spine, 3 cm inferior to the lower border of the occipital protuberance. The second injection site was 1.5 cm superiorly and laterally to the first injection site.  -Trapezius muscle injection was performed at 3 sites, bilaterally. The first injection site was in the upper trapezius muscle halfway between the inflection point of the neck, and the acromion. The second injection site was one half way between the acromion and the first injection site. The third injection was done between the first injection site and the inflection point of the neck.   Will return for repeat injection in 3 months.   A 200 unit sof Botox was used, 155 units were injected, the rest of the Botox was wasted. The patient tolerated the procedure well, there were no complications of the above procedure.

## 2017-11-20 NOTE — Telephone Encounter (Signed)
I made her botox appointment for February 27, 2018

## 2017-11-21 NOTE — Telephone Encounter (Signed)
Noted, thank you

## 2017-12-04 ENCOUNTER — Other Ambulatory Visit: Payer: Self-pay | Admitting: Family Medicine

## 2017-12-04 NOTE — Telephone Encounter (Signed)
Faxed to pharm after TA signed/thx dmf

## 2017-12-08 ENCOUNTER — Other Ambulatory Visit: Payer: Self-pay | Admitting: Family Medicine

## 2017-12-09 NOTE — Telephone Encounter (Signed)
I have refaxed the Rx that was approved and signed by TA on 12.12.18/thx dmf

## 2017-12-10 ENCOUNTER — Other Ambulatory Visit: Payer: Self-pay | Admitting: Family Medicine

## 2017-12-11 NOTE — Telephone Encounter (Signed)
I called in as I have faxed this twice and they have not rec/thx dmf

## 2017-12-30 ENCOUNTER — Other Ambulatory Visit (INDEPENDENT_AMBULATORY_CARE_PROVIDER_SITE_OTHER): Payer: BLUE CROSS/BLUE SHIELD

## 2017-12-30 ENCOUNTER — Encounter: Payer: Self-pay | Admitting: Internal Medicine

## 2017-12-30 ENCOUNTER — Ambulatory Visit (INDEPENDENT_AMBULATORY_CARE_PROVIDER_SITE_OTHER)
Admission: RE | Admit: 2017-12-30 | Discharge: 2017-12-30 | Disposition: A | Discharge status: Home / Self Care | Payer: BLUE CROSS/BLUE SHIELD | Source: Ambulatory Visit | Attending: Internal Medicine | Admitting: Internal Medicine

## 2017-12-30 ENCOUNTER — Ambulatory Visit: Payer: BLUE CROSS/BLUE SHIELD | Admitting: Internal Medicine

## 2017-12-30 VITALS — BP 122/78 | HR 83 | Temp 98.4°F | Ht 62.0 in | Wt 160.1 lb

## 2017-12-30 DIAGNOSIS — Z0000 Encounter for general adult medical examination without abnormal findings: Secondary | ICD-10-CM

## 2017-12-30 DIAGNOSIS — D539 Nutritional anemia, unspecified: Secondary | ICD-10-CM | POA: Diagnosis not present

## 2017-12-30 DIAGNOSIS — E118 Type 2 diabetes mellitus with unspecified complications: Secondary | ICD-10-CM | POA: Diagnosis not present

## 2017-12-30 DIAGNOSIS — R059 Cough, unspecified: Secondary | ICD-10-CM

## 2017-12-30 DIAGNOSIS — J410 Simple chronic bronchitis: Secondary | ICD-10-CM

## 2017-12-30 DIAGNOSIS — J988 Other specified respiratory disorders: Secondary | ICD-10-CM

## 2017-12-30 DIAGNOSIS — E785 Hyperlipidemia, unspecified: Secondary | ICD-10-CM

## 2017-12-30 DIAGNOSIS — E876 Hypokalemia: Secondary | ICD-10-CM

## 2017-12-30 DIAGNOSIS — Z720 Tobacco use: Secondary | ICD-10-CM

## 2017-12-30 DIAGNOSIS — R05 Cough: Secondary | ICD-10-CM

## 2017-12-30 DIAGNOSIS — I739 Peripheral vascular disease, unspecified: Secondary | ICD-10-CM | POA: Diagnosis not present

## 2017-12-30 HISTORY — DX: Nutritional anemia, unspecified: D53.9

## 2017-12-30 LAB — COMPREHENSIVE METABOLIC PANEL
ALBUMIN: 4.4 g/dL (ref 3.5–5.2)
ALT: 10 U/L (ref 0–35)
AST: 10 U/L (ref 0–37)
Alkaline Phosphatase: 112 U/L (ref 39–117)
BUN: 10 mg/dL (ref 6–23)
CALCIUM: 9.7 mg/dL (ref 8.4–10.5)
CHLORIDE: 102 meq/L (ref 96–112)
CO2: 27 meq/L (ref 19–32)
Creatinine, Ser: 0.77 mg/dL (ref 0.40–1.20)
GFR: 83.68 mL/min (ref >60.00)
Glucose, Bld: 140 mg/dL — ABNORMAL HIGH (ref 70–99)
POTASSIUM: 3.7 meq/L (ref 3.5–5.1)
Sodium: 139 mEq/L (ref 135–145)
Total Bilirubin: 0.4 mg/dL (ref 0.2–1.2)
Total Protein: 7.6 g/dL (ref 6.0–8.3)

## 2017-12-30 LAB — FOLATE: Folate: 14.9 ng/mL (ref >5.9)

## 2017-12-30 LAB — URINALYSIS, ROUTINE W REFLEX MICROSCOPIC
Bilirubin Urine: NEGATIVE
Hgb urine dipstick: NEGATIVE
Ketones, ur: NEGATIVE
Nitrite: NEGATIVE
PH: 6.5 (ref 5.0–8.0)
Specific Gravity, Urine: 1.01 (ref 1.000–1.030)
TOTAL PROTEIN, URINE-UPE24: NEGATIVE
Urine Glucose: NEGATIVE
Urobilinogen, UA: 0.2 (ref 0.0–1.0)

## 2017-12-30 LAB — CBC WITH DIFFERENTIAL/PLATELET
BASOS PCT: 0.7 % (ref 0.0–3.0)
Basophils Absolute: 0.1 10*3/uL (ref 0.0–0.1)
EOS ABS: 0.3 10*3/uL (ref 0.0–0.7)
EOS PCT: 2.5 % (ref 0.0–5.0)
HEMATOCRIT: 43.1 % (ref 36.0–46.0)
HEMOGLOBIN: 14.4 g/dL (ref 12.0–15.0)
LYMPHS PCT: 18.6 % (ref 12.0–46.0)
Lymphs Abs: 2.1 10*3/uL (ref 0.7–4.0)
MCHC: 33.3 g/dL (ref 30.0–36.0)
MCV: 93.4 fl (ref 78.0–100.0)
Monocytes Absolute: 0.7 10*3/uL (ref 0.1–1.0)
Monocytes Relative: 6.7 % (ref 3.0–12.0)
Neutro Abs: 8 10*3/uL — ABNORMAL HIGH (ref 1.4–7.7)
Neutrophils Relative %: 71.5 % (ref 43.0–77.0)
PLATELETS: 442 10*3/uL — AB (ref 150.0–400.0)
RBC: 4.61 Mil/uL (ref 3.87–5.11)
RDW: 14.5 % (ref 11.5–15.5)
WBC: 11.1 10*3/uL — ABNORMAL HIGH (ref 4.0–10.5)

## 2017-12-30 LAB — MICROALBUMIN / CREATININE URINE RATIO
CREATININE, U: 83 mg/dL
MICROALB/CREAT RATIO: 1.1 mg/g (ref 0.0–30.0)
Microalb, Ur: 0.9 mg/dL (ref 0.0–1.9)

## 2017-12-30 LAB — LIPID PANEL
CHOL/HDL RATIO: 6
CHOLESTEROL: 257 mg/dL — AB (ref 0–200)
HDL: 44.8 mg/dL (ref >39.00)
LDL CALC: 183 mg/dL — AB (ref 0–99)
NonHDL: 212.69
TRIGLYCERIDES: 146 mg/dL (ref 0.0–149.0)
VLDL: 29.2 mg/dL (ref 0.0–40.0)

## 2017-12-30 LAB — IBC PANEL
IRON: 53 ug/dL (ref 42–145)
Saturation Ratios: 14.1 % — ABNORMAL LOW (ref 20.0–50.0)
Transferrin: 269 mg/dL (ref 212.0–360.0)

## 2017-12-30 LAB — VITAMIN B12: Vitamin B-12: 531 pg/mL (ref 211–911)

## 2017-12-30 LAB — FERRITIN: Ferritin: 54.4 ng/mL (ref 10.0–291.0)

## 2017-12-30 LAB — HEMOGLOBIN A1C: Hgb A1c MFr Bld: 6.8 % — ABNORMAL HIGH (ref 4.6–6.5)

## 2017-12-30 MED ORDER — ACLIDINIUM BROMIDE 400 MCG/ACT IN AEPB: 1.0000 | INHALATION_SPRAY | Freq: Two times a day (BID) | RESPIRATORY_TRACT | 11 refills | Status: DC

## 2017-12-30 MED ORDER — ROSUVASTATIN CALCIUM 20 MG PO TABS: 20.0000 mg | ORAL_TABLET | Freq: Every day | ORAL | 1 refills | Status: DC

## 2017-12-30 MED ORDER — LEVOFLOXACIN 500 MG PO TABS: 500.0000 mg | ORAL_TABLET | Freq: Every day | ORAL | 0 refills | Status: AC

## 2017-12-30 NOTE — Progress Notes (Signed)
Subjective:  Patient ID: Ashley [name], female    DOB: 09/17/1966  Age: 52 y.o. MRN: [id number]  CC: Annual Exam; Cough; Anemia; and Diabetes  NEW TO ME  HPI Ashley [name] presents for a CPX.  She complains of a 59-month history of cough that is productive of thick yellow phlegm.  She also complains of shortness of breath and wheezing.  She has a long history of tobacco abuse.  She denies chest pain or hemoptysis.  She also complains of claudication type pain in her calves and feet.  Outpatient Medications Prior to Visit  Medication Sig Dispense Refill  . amitriptyline (ELAVIL) 50 MG tablet TAKE ONE AND ONE-HALF TABLET BY MOUTH AT DINNER TIME (Patient taking differently: Take 75 mg by mouth at bedtime. ) 90 tablet 3  . AQUALANCE LANCETS 30G MISC   98  . aspirin EC 81 MG tablet Take 81 mg by mouth daily.     Marland Kitchen BAYER CONTOUR TEST test strip Testing for up to tid  98  . clonazePAM (KLONOPIN) 1 MG tablet TAKE 1 TABLET BY MOUTH TWICE DAILY FOR ANXIETY 60 tablet 0  . dicyclomine (BENTYL) 10 MG capsule TAKE ONE CAPSULE BY MOUTH TWICE DAILY AS NEEDED FOR  CRAMPING  AND  ABDOMINAL  PAIN 180 capsule 0  . docusate sodium (COLACE) 100 MG capsule Take 100 mg by mouth at bedtime.    . Fremanezumab-vfrm ([name]) 225 MG/1.5ML SOSY Inject 1 Syringe into the skin every 30 (thirty) days. 1 Syringe 0  . Lancet Devices (ADJUSTABLE LANCING DEVICE) MISC Up to tid testing  98  . metFORMIN (GLUCOPHAGE) 500 MG tablet Take 1 tablet (500 mg total) by mouth 2 (two) times daily with a meal. (Patient taking differently: Take 1,000 mg by mouth daily with breakfast. ) 180 tablet 3  . ondansetron (ZOFRAN ODT) 4 MG disintegrating tablet Take 1 tablet (4 mg total) by mouth every 8 (eight) hours as needed for nausea. (Patient taking differently: Take 4 mg by mouth every 8 (eight) hours as needed for nausea. DISSOLVE IN THE MOUTH) 20 tablet 0  . pantoprazole (PROTONIX) 20 MG tablet Take 1 tablet (20 mg total) by mouth 2 (two)  times daily. 60 tablet 3  . promethazine (PHENERGAN) 25 MG tablet Take 1 tablet (25 mg total) by mouth every 6 (six) hours as needed for nausea or vomiting. 30 tablet 0  . rizatriptan (MAXALT-MLT) 10 MG disintegrating tablet Take 1 tablet (10 mg total) by mouth as needed for migraine. May repeat in 2 hours if needed. Do not take more than 3 tablets in a week. 10 tablet 3  . traZODone (DESYREL) 150 MG tablet Take 1 tablet (150 mg total) by mouth at bedtime. 90 tablet 3  . venlafaxine (EFFEXOR) 37.5 MG tablet Take 1 tablet (37.5 mg total) by mouth 2 (two) times daily. (Patient taking differently: Take 75 mg by mouth every morning. ) 60 tablet 2  . albuterol (PROVENTIL HFA;VENTOLIN HFA) 108 (90 Base) MCG/ACT inhaler Inhale 2 puffs into the lungs every 6 (six) hours as needed for wheezing or shortness of breath.    . Fremanezumab-vfrm ([name]) 225 MG/1.5ML SOSY Inject 1 Syringe into the skin every 30 (thirty) days. 1 Syringe 11   No facility-administered medications prior to visit.     ROS Review of Systems  Constitutional: Negative for appetite change, chills, diaphoresis, fatigue and fever.  HENT: Negative.  Negative for trouble swallowing and voice change.   Eyes: Negative for visual disturbance.  Respiratory: Positive for cough, shortness of breath and wheezing. Negative for choking, chest tightness and stridor.   Cardiovascular: Negative for chest pain, palpitations and leg swelling.  Gastrointestinal: Negative for abdominal pain, constipation, diarrhea, nausea and vomiting.  Endocrine: Negative.   Genitourinary: Negative.  Negative for difficulty urinating and frequency.  Musculoskeletal: Negative.  Negative for arthralgias and myalgias.  Skin: Negative.   Allergic/Immunologic: Negative.   Neurological: Negative.  Negative for dizziness, weakness and headaches.  Hematological: Negative for adenopathy. Does not bruise/bleed easily.  Psychiatric/Behavioral: Negative.  Negative for decreased  concentration, dysphoric mood and suicidal ideas. The patient is not nervous/anxious.     Objective:  BP 122/78 (BP Location: Left Arm, Patient Position: Sitting, Cuff Size: Normal)   Pulse 83   Temp 98.4 F (36.9 C) (Oral)   Ht 5\' 2"  (1.575 m)   Wt 160 lb 1.3 oz (72.6 kg)   LMP 02/12/2014   SpO2 (!) 83%   BMI 29.28 kg/m   BP Readings from Last 3 Encounters:  12/30/17 122/78  11/20/17 132/73  10/02/17 107/60    Wt Readings from Last 3 Encounters:  12/30/17 160 lb 1.3 oz (72.6 kg)  10/15/17 159 lb (72.1 kg)  10/01/17 163 lb 12.8 oz (74.3 kg)    Physical Exam  Constitutional: She is oriented to person, place, and time. No distress.  HENT:  Mouth/Throat: Oropharynx is clear and moist. No oropharyngeal exudate.  Eyes: Conjunctivae are normal. Left eye exhibits no discharge. No scleral icterus.  Neck: Normal range of motion. Neck supple. No JVD present. No thyromegaly present.  Cardiovascular: Normal rate, regular rhythm and normal heart sounds.  No murmur heard. Pulmonary/Chest: Effort normal and breath sounds normal. No respiratory distress. She has no wheezes. She has no rales.  Abdominal: Soft. Bowel sounds are normal. She exhibits no distension and no mass. There is no tenderness.  Musculoskeletal: Normal range of motion. She exhibits no edema, tenderness or deformity.  Lymphadenopathy:    She has no cervical adenopathy.  Neurological: She is alert and oriented to person, place, and time.  Skin: Skin is warm and dry. No rash noted. She is not diaphoretic. No erythema. No pallor.  Psychiatric: She has a normal mood and affect. Her behavior is normal. Judgment and thought content normal.  Vitals reviewed.   Lab Results  Component Value Date   WBC 11.1 (H) 12/30/2017   HGB 14.4 12/30/2017   HCT 43.1 12/30/2017   PLT 442.0 (H) 12/30/2017   GLUCOSE 140 (H) 12/30/2017   CHOL 257 (H) 12/30/2017   TRIG 146.0 12/30/2017   HDL 44.80 12/30/2017   LDLDIRECT 152.1  01/19/2013   LDLCALC 183 (H) 12/30/2017   ALT 10 12/30/2017   AST 10 12/30/2017   NA 139 12/30/2017   K 3.7 12/30/2017   CL 102 12/30/2017   CREATININE 0.77 12/30/2017   BUN 10 12/30/2017   CO2 27 12/30/2017   TSH 1.40 12/30/2017   INR 0.98 09/09/2017   HGBA1C 6.8 (H) 12/30/2017   MICROALBUR 0.9 12/30/2017    Mr Brain Wo Contrast  Result Date: 10/01/2017 CLINICAL DATA:  Initial evaluation for possible stroke, right-sided deficits. Fall. EXAM: MRI HEAD WITHOUT CONTRAST TECHNIQUE: Multiplanar, multiecho pulse sequences of the brain and surrounding structures were obtained without intravenous contrast. COMPARISON:  Prior CT from earlier the same day. FINDINGS: Brain: Cerebral volume within normal limits for age. No focal parenchymal signal abnormality identified. No abnormal foci of restricted diffusion to suggest acute or subacute ischemia. Gray-white  matter differentiation well maintained. No encephalomalacia to suggest chronic infarction. No foci of susceptibility artifact to suggest acute or chronic intracranial hemorrhage. No mass lesion, midline shift or mass effect. Ventricles normal in size without evidence for hydrocephalus. No extra-axial fluid collection. Pituitary gland suprasellar region normal. Midline structures intact and normal. Vascular: Major intracranial vascular flow voids are maintained. Skull and upper cervical spine: Craniocervical junction normal. Upper cervical spine within normal limits. Bone marrow signal intensity normal. No scalp soft tissue abnormality. Sinuses/Orbits: Globes and orbital soft tissues within normal limits. Paranasal sinuses are clear. Trace right mastoid effusion noted, of doubtful significance. Inner ear structures normal. Other: None. IMPRESSION: Normal MRI of the brain. No acute intracranial abnormality identified. Electronically Signed   By: Jeannine Boga M.D.   On: 10/01/2017 23:32   Ct Head Code Stroke Wo Contrast  Result Date:  10/01/2017 CLINICAL DATA:  Code stroke. Slurred speech and right-sided weakness. EXAM: CT HEAD WITHOUT CONTRAST TECHNIQUE: Contiguous axial images were obtained from the base of the skull through the vertex without intravenous contrast. COMPARISON:  04/25/2017 FINDINGS: Brain: No evidence of acute infarction, hemorrhage, hydrocephalus, extra-axial collection or mass lesion/mass effect. Vascular: No hyperdense vessel or unexpected calcification. Skull: Normal. Negative for fracture or focal lesion. Sinuses/Orbits: Negative Other: Text page with results sent 10/01/2017 at 8:22 pm to Dr. Cheral Marker ASPECTS [facility] Stroke Program Early CT Score) - Ganglionic level infarction (caudate, lentiform nuclei, internal capsule, insula, M1-M3 cortex): 7 - Supraganglionic infarction (M4-M6 cortex): 3 Total score (0-10 with 10 being normal): 10 IMPRESSION: Negative head CT. ASPECTS is 10. Electronically Signed   By: Monte Fantasia M.D.   On: 10/01/2017 20:23    Assessment & Plan:   Ashley [name] was seen today for annual exam, cough, anemia and diabetes.  Diagnoses and all orders for this visit:  Cough- chest x-ray is negative for mass or infiltrate.  I have asked her to undergo PFTs to confirm the diagnosis of chronic bronchitis or COPD. -     Pulmonary Function Test; Future -     DG Chest 2 View; Future  Claudication of both lower extremities (Ashley [name])- she has lower extremity symptoms and I cannot feel pulses in her feet.  She does not have any signs of critical ischemia in the feet.  I have asked her to undergo ABIs to see how severe this is.  In the meantime. Will start risk factor modification with statin and aspirin. -     VAS [place] ABI WITH/WO TBI; Future -     rosuvastatin (CRESTOR) 20 MG tablet; Take 1 tablet (20 mg total) by mouth daily.  Simple chronic bronchitis (Ashley [name])- I have asked her to start treating this with a LAMA. -     Pulmonary Function Test; Future -     Aclidinium Bromide (TUDORZA PRESSAIR) 400 MCG/ACT  AEPB; Inhale 1 puff into the lungs 2 (two) times daily.  Hypokalemia- Her potassium level is normal now. -     Comprehensive metabolic panel; Future  Tobacco abuse  Diabetes mellitus with complication (Ashley [name])- Her Y1E is at 6.8%.  Her blood sugars are adequately well controlled. -     Urinalysis, Routine w reflex microscopic; Future -     Hemoglobin A1c; Future -     Microalbumin / creatinine urine ratio; Future -     rosuvastatin (CRESTOR) 20 MG tablet; Take 1 tablet (20 mg total) by mouth daily.  Deficiency anemia- Her H&H and vitamin levels are normal now. -  CBC with Differential/Platelet; Future -     IBC panel; Future -     Vitamin B12; Future -     Folate; Future -     Ferritin; Future -     Vitamin B1; Future  Routine general medical examination at a health care facility- exam completed, labs reviewed, vaccines reviewed and updated, she was referred for a mammogram, Pap and colon cancer screening are up-to-date. -     Lipid panel; Future  Dyslipidemia, goal LDL below 70- She has not achieved her LDL goal.  Will start a statin. -     Thyroid Panel With TSH; Future -     rosuvastatin (CRESTOR) 20 MG tablet; Take 1 tablet (20 mg total) by mouth daily.  RTI (respiratory tract infection)- I will treat the infection with Levaquin. -     levofloxacin (LEVAQUIN) 500 MG tablet; Take 1 tablet (500 mg total) by mouth daily for 7 days.   I am having Ashley [name] start on levofloxacin, Aclidinium Bromide, and rosuvastatin. I am also having her maintain her aspirin EC, docusate sodium, albuterol, rizatriptan, promethazine, metFORMIN, traZODone, pantoprazole, amitriptyline, BAYER CONTOUR TEST, Adjustable Lancing Device, venlafaxine, ondansetron, Fremanezumab-vfrm, dicyclomine, clonazePAM, and AQUALANCE LANCETS 30G.  Meds ordered this encounter  Medications  . levofloxacin (LEVAQUIN) 500 MG tablet    Sig: Take 1 tablet (500 mg total) by mouth daily for 7 days.    Dispense:  7 tablet     Refill:  0  . Aclidinium Bromide (TUDORZA PRESSAIR) 400 MCG/ACT AEPB    Sig: Inhale 1 puff into the lungs 2 (two) times daily.    Dispense:  1 each    Refill:  11  . rosuvastatin (CRESTOR) 20 MG tablet    Sig: Take 1 tablet (20 mg total) by mouth daily.    Dispense:  90 tablet    Refill:  1     Follow-up: Return in about 6 weeks (around 02/10/2018).  Scarlette Calico, MD

## 2017-12-30 NOTE — Patient Instructions (Signed)

## 2017-12-31 ENCOUNTER — Encounter: Payer: Self-pay | Admitting: Internal Medicine

## 2017-12-31 NOTE — Progress Notes (Deleted)
Subjective:  Patient ID: Ashley [name], female    DOB: 19-Nov-1966  Age: 52 y.o. MRN: [id number]  CC: No chief complaint on file.   HPI Ashley [name] presents for ***  Outpatient Medications Prior to Visit  Medication Sig Dispense Refill  . Aclidinium Bromide (TUDORZA PRESSAIR) 400 MCG/ACT AEPB Inhale 1 puff into the lungs 2 (two) times daily. 1 each 11  . albuterol (PROVENTIL HFA;VENTOLIN HFA) 108 (90 Base) MCG/ACT inhaler Inhale 2 puffs into the lungs every 6 (six) hours as needed for wheezing or shortness of breath.    Marland Kitchen amitriptyline (ELAVIL) 50 MG tablet TAKE ONE AND ONE-HALF TABLET BY MOUTH AT DINNER TIME (Patient taking differently: Take 75 mg by mouth at bedtime. ) 90 tablet 3  . AQUALANCE LANCETS 30G MISC   98  . aspirin EC 81 MG tablet Take 81 mg by mouth daily.     Marland Kitchen BAYER CONTOUR TEST test strip Testing for up to tid  98  . clonazePAM (KLONOPIN) 1 MG tablet TAKE 1 TABLET BY MOUTH TWICE DAILY FOR ANXIETY 60 tablet 0  . dicyclomine (BENTYL) 10 MG capsule TAKE ONE CAPSULE BY MOUTH TWICE DAILY AS NEEDED FOR  CRAMPING  AND  ABDOMINAL  PAIN 180 capsule 0  . docusate sodium (COLACE) 100 MG capsule Take 100 mg by mouth at bedtime.    . Fremanezumab-vfrm ([name]) 225 MG/1.5ML SOSY Inject 1 Syringe into the skin every 30 (thirty) days. 1 Syringe 0  . Lancet Devices (ADJUSTABLE LANCING DEVICE) MISC Up to tid testing  98  . levofloxacin (LEVAQUIN) 500 MG tablet Take 1 tablet (500 mg total) by mouth daily for 7 days. 7 tablet 0  . metFORMIN (GLUCOPHAGE) 500 MG tablet Take 1 tablet (500 mg total) by mouth 2 (two) times daily with a meal. (Patient taking differently: Take 1,000 mg by mouth daily with breakfast. ) 180 tablet 3  . ondansetron (ZOFRAN ODT) 4 MG disintegrating tablet Take 1 tablet (4 mg total) by mouth every 8 (eight) hours as needed for nausea. (Patient taking differently: Take 4 mg by mouth every 8 (eight) hours as needed for nausea. DISSOLVE IN THE MOUTH) 20 tablet 0  . pantoprazole  (PROTONIX) 20 MG tablet Take 1 tablet (20 mg total) by mouth 2 (two) times daily. 60 tablet 3  . promethazine (PHENERGAN) 25 MG tablet Take 1 tablet (25 mg total) by mouth every 6 (six) hours as needed for nausea or vomiting. 30 tablet 0  . rizatriptan (MAXALT-MLT) 10 MG disintegrating tablet Take 1 tablet (10 mg total) by mouth as needed for migraine. May repeat in 2 hours if needed. Do not take more than 3 tablets in a week. 10 tablet 3  . rosuvastatin (CRESTOR) 20 MG tablet Take 1 tablet (20 mg total) by mouth daily. 90 tablet 1  . traZODone (DESYREL) 150 MG tablet Take 1 tablet (150 mg total) by mouth at bedtime. 90 tablet 3  . venlafaxine (EFFEXOR) 37.5 MG tablet Take 1 tablet (37.5 mg total) by mouth 2 (two) times daily. (Patient taking differently: Take 75 mg by mouth every morning. ) 60 tablet 2   No facility-administered medications prior to visit.     ROS Review of Systems  Objective:  LMP 02/12/2014   BP Readings from Last 3 Encounters:  12/30/17 122/78  11/20/17 132/73  10/02/17 107/60    Wt Readings from Last 3 Encounters:  12/30/17 160 lb 1.3 oz (72.6 kg)  10/15/17 159 lb (72.1 kg)  10/01/17 163 lb 12.8 oz (74.3 kg)    Physical Exam  Lab Results  Component Value Date   WBC 11.1 (H) 12/30/2017   HGB 14.4 12/30/2017   HCT 43.1 12/30/2017   PLT 442.0 (H) 12/30/2017   GLUCOSE 140 (H) 12/30/2017   CHOL 257 (H) 12/30/2017   TRIG 146.0 12/30/2017   HDL 44.80 12/30/2017   LDLDIRECT 152.1 01/19/2013   LDLCALC 183 (H) 12/30/2017   ALT 10 12/30/2017   AST 10 12/30/2017   NA 139 12/30/2017   K 3.7 12/30/2017   CL 102 12/30/2017   CREATININE 0.77 12/30/2017   BUN 10 12/30/2017   CO2 27 12/30/2017   TSH 1.40 12/30/2017   INR 0.98 09/09/2017   HGBA1C 6.8 (H) 12/30/2017   MICROALBUR 0.9 12/30/2017    Dg Chest 2 View  Result Date: 12/30/2017 CLINICAL DATA:  Productive cough and shortness of breath for approximately 2 months. EXAM: CHEST  2 VIEW COMPARISON:   08/17/2017 FINDINGS: The heart size and mediastinal contours are within normal limits. Bilateral lower lung scarring is again seen. No evidence of pulmonary infiltrate or pleural effusion. The visualized skeletal structures are unremarkable. IMPRESSION: Stable bilateral pulmonary parenchymal scarring. No active lung disease. Electronically Signed   By: Earle Gell M.D.   On: 12/30/2017 12:51    Assessment & Plan:   There are no diagnoses linked to this encounter. I am having Ashley [name] maintain her aspirin EC, docusate sodium, albuterol, rizatriptan, promethazine, metFORMIN, traZODone, pantoprazole, amitriptyline, BAYER CONTOUR TEST, Adjustable Lancing Device, venlafaxine, ondansetron, Fremanezumab-vfrm, dicyclomine, clonazePAM, AQUALANCE LANCETS 30G, levofloxacin, Aclidinium Bromide, and rosuvastatin.  No orders of the defined types were placed in this encounter.    Follow-up: No Follow-up on file.  Ashley Calico, MD

## 2018-01-01 MED ORDER — ASPIRIN EC 81 MG PO TBEC: 81.0000 mg | DELAYED_RELEASE_TABLET | Freq: Every day | ORAL | 1 refills | Status: DC

## 2018-01-03 ENCOUNTER — Ambulatory Visit (HOSPITAL_COMMUNITY)
Admission: RE | Admit: 2018-01-03 | Discharge: 2018-01-03 | Disposition: A | Discharge status: Home / Self Care | Payer: BLUE CROSS/BLUE SHIELD | Source: Ambulatory Visit | Attending: Vascular Surgery | Admitting: Vascular Surgery

## 2018-01-03 DIAGNOSIS — I10 Essential (primary) hypertension: Secondary | ICD-10-CM | POA: Insufficient documentation

## 2018-01-03 DIAGNOSIS — F17200 Nicotine dependence, unspecified, uncomplicated: Secondary | ICD-10-CM | POA: Insufficient documentation

## 2018-01-03 DIAGNOSIS — E785 Hyperlipidemia, unspecified: Secondary | ICD-10-CM | POA: Insufficient documentation

## 2018-01-03 DIAGNOSIS — E1151 Type 2 diabetes mellitus with diabetic peripheral angiopathy without gangrene: Secondary | ICD-10-CM | POA: Insufficient documentation

## 2018-01-03 DIAGNOSIS — I739 Peripheral vascular disease, unspecified: Secondary | ICD-10-CM | POA: Diagnosis not present

## 2018-01-03 LAB — THYROID PANEL WITH TSH
FREE THYROXINE INDEX: 2.8 (ref 1.4–3.8)
T3 UPTAKE: 26 % (ref 22–35)
T4, Total: 10.9 ug/dL (ref 5.1–11.9)
TSH: 1.4 m[IU]/L

## 2018-01-03 LAB — VITAMIN B1: Vitamin B1 (Thiamine): 7 nmol/L — ABNORMAL LOW (ref 8–30)

## 2018-01-05 ENCOUNTER — Other Ambulatory Visit: Payer: Self-pay | Admitting: Internal Medicine

## 2018-01-05 DIAGNOSIS — E519 Thiamine deficiency, unspecified: Secondary | ICD-10-CM | POA: Insufficient documentation

## 2018-01-05 MED ORDER — THIAMINE HCL 100 MG PO TABS: 100.0000 mg | ORAL_TABLET | Freq: Every day | ORAL | 1 refills | Status: DC

## 2018-01-06 ENCOUNTER — Other Ambulatory Visit: Payer: Self-pay | Admitting: Internal Medicine

## 2018-01-06 ENCOUNTER — Telehealth: Payer: Self-pay | Admitting: Internal Medicine

## 2018-01-06 DIAGNOSIS — G43119 Migraine with aura, intractable, without status migrainosus: Secondary | ICD-10-CM

## 2018-01-06 DIAGNOSIS — F411 Generalized anxiety disorder: Secondary | ICD-10-CM

## 2018-01-06 MED ORDER — VENLAFAXINE HCL 37.5 MG PO TABS: 37.5000 mg | ORAL_TABLET | Freq: Two times a day (BID) | ORAL | 5 refills | Status: DC

## 2018-01-06 MED ORDER — CLONAZEPAM 1 MG PO TABS: ORAL_TABLET | ORAL | 3 refills | Status: DC

## 2018-01-06 MED ORDER — PROMETHAZINE HCL 25 MG PO TABS: 25.0000 mg | ORAL_TABLET | Freq: Four times a day (QID) | ORAL | 1 refills | Status: DC | PRN

## 2018-01-06 NOTE — Telephone Encounter (Signed)
Check [place] registry last filled clonazepam 12/11/2017...Ashley [name]

## 2018-01-06 NOTE — Telephone Encounter (Signed)
Copied from [place]. Topic: Quick Communication - Rx Refill/Question >> Jan 06, 2018  9:20 AM Lolita Rieger, RMA wrote: Medication:effexor 37.5 mg, clonazapam 1mg , promethazine 25mg    Has the patient contacted their pharmacy? yes   (Agent: If no, request that the patient contact the pharmacy for the refill.)   Preferred Pharmacy (with phone number or street name): [facility] [address]: Please be advised that RX refills may take up to 3 business days. We ask that you follow-up with your pharmacy.

## 2018-01-07 NOTE — Telephone Encounter (Signed)
Notified pt rx has been sent to [facility] 

## 2018-01-14 ENCOUNTER — Ambulatory Visit (INDEPENDENT_AMBULATORY_CARE_PROVIDER_SITE_OTHER): Payer: BLUE CROSS/BLUE SHIELD | Admitting: Internal Medicine

## 2018-01-14 DIAGNOSIS — R05 Cough: Secondary | ICD-10-CM | POA: Diagnosis not present

## 2018-01-14 DIAGNOSIS — J410 Simple chronic bronchitis: Secondary | ICD-10-CM

## 2018-01-14 DIAGNOSIS — R059 Cough, unspecified: Secondary | ICD-10-CM

## 2018-01-14 LAB — PULMONARY FUNCTION TEST
DL/VA % pred: 73 %
DL/VA: 3.46 ml/min/mmHg/L
DLCO COR % PRED: 59 %
DLCO UNC % PRED: 55 %
DLCO UNC: 12.82 ml/min/mmHg
DLCO cor: 13.59 ml/min/mmHg
FEF 25-75 POST: 2.42 L/s
FEF 25-75 PRE: 1.97 L/s
FEF2575-%Change-Post: 22 %
FEF2575-%PRED-POST: 92 %
FEF2575-%PRED-PRE: 75 %
FEV1-%CHANGE-POST: 2 %
FEV1-%PRED-POST: 78 %
FEV1-%Pred-Pre: 76 %
FEV1-POST: 2.09 L
FEV1-Pre: 2.03 L
FEV1FVC-%Change-Post: 7 %
FEV1FVC-%PRED-PRE: 102 %
FEV6-%CHANGE-POST: -3 %
FEV6-%PRED-POST: 72 %
FEV6-%Pred-Pre: 75 %
FEV6-Post: 2.38 L
FEV6-Pre: 2.48 L
FEV6FVC-%CHANGE-POST: 0 %
FEV6FVC-%PRED-POST: 103 %
FEV6FVC-%Pred-Pre: 103 %
FVC-%Change-Post: -3 %
FVC-%Pred-Post: 70 %
FVC-%Pred-Pre: 73 %
FVC-Post: 2.38 L
FVC-Pre: 2.48 L
POST FEV1/FVC RATIO: 88 %
PRE FEV1/FVC RATIO: 82 %
PRE FEV6/FVC RATIO: 100 %
Post FEV6/FVC ratio: 100 %
RV % pred: 113 %
RV: 2.01 L
TLC % PRED: 95 %
TLC: 4.7 L

## 2018-01-14 NOTE — Progress Notes (Signed)
PFT completed today 01/14/18.

## 2018-01-22 ENCOUNTER — Other Ambulatory Visit: Payer: Self-pay | Admitting: Family Medicine

## 2018-01-28 ENCOUNTER — Telehealth: Payer: Self-pay | Admitting: Internal Medicine

## 2018-01-28 MED ORDER — PANTOPRAZOLE SODIUM 20 MG PO TBEC: 20.0000 mg | DELAYED_RELEASE_TABLET | Freq: Two times a day (BID) | ORAL | 3 refills | Status: DC

## 2018-01-28 NOTE — Telephone Encounter (Signed)
Copied from [place] [phone]. Topic: Quick Communication - Rx Refill/Question >> Jan 28, 2018 11:12 AM Ahmed Prima L wrote: Medication: pantoprazole (PROTONIX) 20 MG tablet   Has the patient contacted their pharmacy? yes   (Agent: If no, request that the patient contact the pharmacy for the refill.)   Preferred Pharmacy (with phone number or street name): [place], [place] - [address]   Agent: Please be advised that RX refills may take up to 3 business days. We ask that you follow-up with your pharmacy.

## 2018-01-28 NOTE — Telephone Encounter (Signed)
Protonix refill Last OV: 12/30/17  Last Refill: 09/12/17  #60; RF x 3 [place].   Above medication refilled.

## 2018-02-17 ENCOUNTER — Ambulatory Visit: Payer: BLUE CROSS/BLUE SHIELD | Admitting: Internal Medicine

## 2018-02-17 ENCOUNTER — Ambulatory Visit (INDEPENDENT_AMBULATORY_CARE_PROVIDER_SITE_OTHER)
Admission: RE | Admit: 2018-02-17 | Discharge: 2018-02-17 | Disposition: A | Discharge status: Home / Self Care | Payer: BLUE CROSS/BLUE SHIELD | Source: Ambulatory Visit | Attending: Internal Medicine | Admitting: Internal Medicine

## 2018-02-17 ENCOUNTER — Encounter: Payer: Self-pay | Admitting: Internal Medicine

## 2018-02-17 VITALS — BP 124/78 | HR 78 | Temp 98.2°F | Ht 62.0 in | Wt 168.0 lb

## 2018-02-17 DIAGNOSIS — Z23 Encounter for immunization: Secondary | ICD-10-CM

## 2018-02-17 DIAGNOSIS — R519 Headache, unspecified: Secondary | ICD-10-CM | POA: Insufficient documentation

## 2018-02-17 DIAGNOSIS — R51 Headache: Secondary | ICD-10-CM

## 2018-02-17 DIAGNOSIS — K1370 Unspecified lesions of oral mucosa: Secondary | ICD-10-CM | POA: Diagnosis not present

## 2018-02-17 NOTE — Progress Notes (Signed)
Subjective:  Patient ID: Ashley [name], female    DOB: 1966/11/12  Age: 52 y.o. MRN: [id number]  CC: Facial Pain   HPI Ashley [name] presents for concerns about a painful lesion in her left upper mouth.  She is also had a few episodes of facial pain.  She has a little bit of nasal phlegm but it is clear and not thick.  Outpatient Medications Prior to Visit  Medication Sig Dispense Refill  . Aclidinium Bromide (TUDORZA PRESSAIR) 400 MCG/ACT AEPB Inhale 1 puff into the lungs 2 (two) times daily. 1 each 11  . albuterol (PROVENTIL HFA;VENTOLIN HFA) 108 (90 Base) MCG/ACT inhaler Inhale 2 puffs into the lungs every 6 (six) hours as needed for wheezing or shortness of breath.    Marland Kitchen amitriptyline (ELAVIL) 50 MG tablet TAKE ONE AND ONE-HALF TABLET BY MOUTH AT DINNER TIME (Patient taking differently: Take 75 mg by mouth at bedtime. ) 90 tablet 3  . AQUALANCE LANCETS 30G MISC   98  . aspirin EC 81 MG tablet Take 1 tablet (81 mg total) by mouth daily. 90 tablet 1  . BAYER CONTOUR TEST test strip Testing for up to tid  98  . clonazePAM (KLONOPIN) 1 MG tablet TAKE 1 TABLET BY MOUTH TWICE DAILY FOR ANXIETY 60 tablet 3  . dicyclomine (BENTYL) 10 MG capsule TAKE ONE CAPSULE BY MOUTH TWICE DAILY AS NEEDED FOR  CRAMPING  AND  ABDOMINAL  PAIN 180 capsule 0  . docusate sodium (COLACE) 100 MG capsule Take 100 mg by mouth at bedtime.    . Fremanezumab-vfrm ([name]) 225 MG/1.5ML SOSY Inject 1 Syringe into the skin every 30 (thirty) days. 1 Syringe 0  . Lancet Devices (ADJUSTABLE LANCING DEVICE) MISC Up to tid testing  98  . metFORMIN (GLUCOPHAGE) 500 MG tablet Take 1 tablet (500 mg total) by mouth 2 (two) times daily with a meal. (Patient taking differently: Take 1,000 mg by mouth daily with breakfast. ) 180 tablet 3  . ondansetron (ZOFRAN ODT) 4 MG disintegrating tablet Take 1 tablet (4 mg total) by mouth every 8 (eight) hours as needed for nausea. (Patient taking differently: Take 4 mg by mouth every 8 (eight) hours as  needed for nausea. DISSOLVE IN THE MOUTH) 20 tablet 0  . pantoprazole (PROTONIX) 20 MG tablet Take 1 tablet (20 mg total) by mouth 2 (two) times daily. 60 tablet 3  . promethazine (PHENERGAN) 25 MG tablet Take 1 tablet (25 mg total) by mouth every 6 (six) hours as needed for nausea or vomiting. 30 tablet 1  . rizatriptan (MAXALT-MLT) 10 MG disintegrating tablet Take 1 tablet (10 mg total) by mouth as needed for migraine. May repeat in 2 hours if needed. Do not take more than 3 tablets in a week. 10 tablet 3  . rosuvastatin (CRESTOR) 20 MG tablet Take 1 tablet (20 mg total) by mouth daily. 90 tablet 1  . thiamine 100 MG tablet Take 1 tablet (100 mg total) by mouth daily. 90 tablet 1  . traZODone (DESYREL) 150 MG tablet Take 1 tablet (150 mg total) by mouth at bedtime. 90 tablet 3  . venlafaxine (EFFEXOR) 37.5 MG tablet Take 1 tablet (37.5 mg total) by mouth 2 (two) times daily. 60 tablet 5   No facility-administered medications prior to visit.     ROS Review of Systems  Constitutional: Negative.  Negative for appetite change, chills, fatigue and fever.  HENT: Positive for sinus pressure. Negative for facial swelling, sinus pain, sneezing,  sore throat, tinnitus, trouble swallowing and voice change.   Eyes: Negative.   Respiratory: Negative.  Negative for cough, chest tightness, shortness of breath and wheezing.   Cardiovascular: Negative for chest pain, palpitations and leg swelling.  Gastrointestinal: Negative for abdominal pain, constipation, diarrhea, nausea and vomiting.  Endocrine: Negative.   Genitourinary: Negative.   Musculoskeletal: Negative.   Skin: Negative.  Negative for color change and pallor.  Allergic/Immunologic: Negative.   Neurological: Negative.  Negative for dizziness, weakness, light-headedness and headaches.  Hematological: Negative for adenopathy. Does not bruise/bleed easily.  Psychiatric/Behavioral: Negative.     Objective:  BP 124/78 (BP Location: Left Arm,  Patient Position: Sitting, Cuff Size: Large)   Pulse 78   Temp 98.2 F (36.8 C) (Oral)   Ht 5\' 2"  (1.575 m)   Wt 168 lb (76.2 kg)   LMP 02/12/2014   SpO2 98%   BMI 30.73 kg/m   BP Readings from Last 3 Encounters:  02/17/18 124/78  12/30/17 122/78  11/20/17 132/73    Wt Readings from Last 3 Encounters:  02/17/18 168 lb (76.2 kg)  12/30/17 160 lb 1.3 oz (72.6 kg)  10/15/17 159 lb (72.1 kg)    Physical Exam  Constitutional: No distress.  HENT:  Nose: No mucosal edema, rhinorrhea or sinus tenderness. No epistaxis. Right sinus exhibits no maxillary sinus tenderness and no frontal sinus tenderness. Left sinus exhibits no maxillary sinus tenderness and no frontal sinus tenderness.  Mouth/Throat: Oropharynx is clear and moist. No oropharyngeal exudate.    Eyes: Conjunctivae are normal. Left eye exhibits no discharge. No scleral icterus.  Neck: Normal range of motion. Neck supple. No JVD present. No thyromegaly present.  Cardiovascular: Normal rate, regular rhythm and normal heart sounds. Exam reveals no gallop.  No murmur heard. Pulmonary/Chest: Effort normal and breath sounds normal. No respiratory distress. She has no wheezes. She has no rales.  Abdominal: Soft. Bowel sounds are normal. She exhibits no distension.  Lymphadenopathy:    She has no cervical adenopathy.  Skin: She is not diaphoretic.  Vitals reviewed.   Lab Results  Component Value Date   WBC 11.1 (H) 12/30/2017   HGB 14.4 12/30/2017   HCT 43.1 12/30/2017   PLT 442.0 (H) 12/30/2017   GLUCOSE 140 (H) 12/30/2017   CHOL 257 (H) 12/30/2017   TRIG 146.0 12/30/2017   HDL 44.80 12/30/2017   LDLDIRECT 152.1 01/19/2013   LDLCALC 183 (H) 12/30/2017   ALT 10 12/30/2017   AST 10 12/30/2017   NA 139 12/30/2017   K 3.7 12/30/2017   CL 102 12/30/2017   CREATININE 0.77 12/30/2017   BUN 10 12/30/2017   CO2 27 12/30/2017   TSH 1.40 12/30/2017   INR 0.98 09/09/2017   HGBA1C 6.8 (H) 12/30/2017   MICROALBUR 0.9  12/30/2017    No results found.  Assessment & Plan:   Ashley [name] was seen today for facial pain.  Diagnoses and all orders for this visit:  Need for Tdap vaccination -     Tdap vaccine greater than or equal to 7yo IM  Left facial pain- Based on her symptoms, exam, and plain films there is no evidence of a sinus infection.  I am concerned this may be related to the oral lesion so I have referred her to oral surgery to see if a biopsy needs to be performed.  If the workup with oral surgery is unremarkable and the pain does not resolve then I will consider treatment options for trigeminal neuralgia. -  DG Sinuses Complete; Future  Oral lesion -     Ambulatory referral to Oral Maxillofacial Surgery   I am having Ashley [name] maintain her docusate sodium, albuterol, rizatriptan, metFORMIN, traZODone, amitriptyline, BAYER CONTOUR TEST, Adjustable Lancing Device, ondansetron, Fremanezumab-vfrm, dicyclomine, AQUALANCE LANCETS 30G, Aclidinium Bromide, rosuvastatin, aspirin EC, thiamine, promethazine, venlafaxine, clonazePAM, and pantoprazole.  No orders of the defined types were placed in this encounter.    Follow-up: No Follow-up on file.  Scarlette Calico, MD

## 2018-02-18 ENCOUNTER — Encounter: Payer: Self-pay | Admitting: Internal Medicine

## 2018-02-18 NOTE — Patient Instructions (Signed)
Trigeminal Neuralgia Trigeminal neuralgia is a nerve disorder that causes attacks of severe facial pain. The attacks last from a few seconds to several minutes. They can happen for days, weeks, or months and then go away for months or years. Trigeminal neuralgia is also called tic douloureux. What are the causes? This condition is caused by damage to a nerve in the face that is called the trigeminal nerve. An attack can be triggered by:  Talking.  Chewing.  Putting on makeup.  Washing your face.  Shaving your face.  Brushing your teeth.  Touching your face.  What increases the risk? This condition is more likely to develop in:  Women.  People who are 50 years of age or older.  What are the signs or symptoms? The main symptom of this condition is pain in the jaw, lips, eyes, nose, scalp, forehead, and face. The pain may be intense, stabbing, electric, or shock-like. How is this diagnosed? This condition is diagnosed with a physical exam. A CT scan or MRI may be done to rule out other conditions that can cause facial pain. How is this treated? This condition may be treated with:  Avoiding the things that trigger your attacks.  Pain medicine.  Surgery. This may be done in severe cases if other medical treatment does not provide relief.  Follow these instructions at home:  Take over-the-counter and prescription medicines only as told by your health care provider.  If you wish to get pregnant, talk with your health care provider before you start trying to get pregnant.  Avoid the things that trigger your attacks. It may help to: ? Chew on the unaffected side of your mouth. ? Avoid touching your face. ? Avoid blasts of hot or cold air. Contact a health care provider if:  Your pain medicine is not helping.  You develop new, unexplained symptoms, such as: ? Double vision. ? Facial weakness. ? Changes in hearing or balance.  You become pregnant. Get help right away  if:  Your pain is unbearable, and your pain medicine does not help. This information is not intended to replace advice given to you by your health care provider. Make sure you discuss any questions you have with your health care provider. Document Released: 12/07/2000 Document Revised: 08/12/2016 Document Reviewed: 04/04/2015 Elsevier Interactive Patient Education  2018 [employer]  

## 2018-02-27 ENCOUNTER — Telehealth: Payer: Self-pay | Admitting: Neurology

## 2018-02-27 ENCOUNTER — Ambulatory Visit: Payer: BLUE CROSS/BLUE SHIELD | Admitting: Neurology

## 2018-02-27 NOTE — Telephone Encounter (Signed)
Pt called wanting to r/s her botox appt for today 3/7 at 4

## 2018-03-03 ENCOUNTER — Encounter: Payer: Self-pay | Admitting: Neurology

## 2018-03-05 NOTE — Telephone Encounter (Signed)
Noted, thank you

## 2018-03-05 NOTE — Telephone Encounter (Signed)
Called and spoke to patient she is rescheduled for Botox with Dr. Jaynee Eagles 03/20/2018

## 2018-03-12 ENCOUNTER — Ambulatory Visit (INDEPENDENT_AMBULATORY_CARE_PROVIDER_SITE_OTHER): Payer: BLUE CROSS/BLUE SHIELD | Admitting: Internal Medicine

## 2018-03-12 ENCOUNTER — Encounter: Payer: Self-pay | Admitting: Internal Medicine

## 2018-03-12 VITALS — BP 118/78 | HR 82 | Temp 98.8°F | Resp 16 | Ht 62.0 in | Wt 172.0 lb

## 2018-03-12 DIAGNOSIS — E118 Type 2 diabetes mellitus with unspecified complications: Secondary | ICD-10-CM | POA: Diagnosis not present

## 2018-03-12 LAB — POCT GLYCOSYLATED HEMOGLOBIN (HGB A1C): HEMOGLOBIN A1C: 7.4

## 2018-03-12 LAB — POCT GLUCOSE (DEVICE FOR HOME USE): Glucose Fasting, POC: 157 mg/dL — AB (ref 70–99)

## 2018-03-12 MED ORDER — BAYER CONTOUR TEST VI STRP: ORAL_STRIP | 98 refills | Status: DC

## 2018-03-12 MED ORDER — INSULIN DEGLUDEC-LIRAGLUTIDE 100-3.6 UNIT-MG/ML ~~LOC~~ SOPN: 25.0000 [IU] | PEN_INJECTOR | Freq: Every day | SUBCUTANEOUS | 3 refills | Status: DC

## 2018-03-12 NOTE — Progress Notes (Signed)
Subjective:  Patient ID: Ashley [name], female    DOB: 1966-12-07  Age: 52 y.o. MRN: [id number]  CC: Hyperglycemia and Diabetes   HPI Ashley [name] presents for f/up - She complains that her blood sugars have been too high over the last 2 weeks.  She tells me that her average has been above 200.  She has had some blood sugars go up as high as 295 and 300.  She complains of alterations in her visual acuity as well as polyuria, polydipsia, polyphagia, and weight gain.  She is compliant with metformin.  Outpatient Medications Prior to Visit  Medication Sig Dispense Refill  . Aclidinium Bromide (TUDORZA PRESSAIR) 400 MCG/ACT AEPB Inhale 1 puff into the lungs 2 (two) times daily. 1 each 11  . albuterol (PROVENTIL HFA;VENTOLIN HFA) 108 (90 Base) MCG/ACT inhaler Inhale 2 puffs into the lungs every 6 (six) hours as needed for wheezing or shortness of breath.    Marland Kitchen amitriptyline (ELAVIL) 50 MG tablet TAKE ONE AND ONE-HALF TABLET BY MOUTH AT DINNER TIME (Patient taking differently: Take 75 mg by mouth at bedtime. ) 90 tablet 3  . AQUALANCE LANCETS 30G MISC   98  . aspirin EC 81 MG tablet Take 1 tablet (81 mg total) by mouth daily. 90 tablet 1  . clonazePAM (KLONOPIN) 1 MG tablet TAKE 1 TABLET BY MOUTH TWICE DAILY FOR ANXIETY 60 tablet 3  . dicyclomine (BENTYL) 10 MG capsule TAKE ONE CAPSULE BY MOUTH TWICE DAILY AS NEEDED FOR  CRAMPING  AND  ABDOMINAL  PAIN 180 capsule 0  . docusate sodium (COLACE) 100 MG capsule Take 100 mg by mouth at bedtime.    . Fremanezumab-vfrm ([name]) 225 MG/1.5ML SOSY Inject 1 Syringe into the skin every 30 (thirty) days. 1 Syringe 0  . Lancet Devices (ADJUSTABLE LANCING DEVICE) MISC Up to tid testing  98  . metFORMIN (GLUCOPHAGE) 500 MG tablet Take 1 tablet (500 mg total) by mouth 2 (two) times daily with a meal. (Patient taking differently: Take 1,000 mg by mouth daily with breakfast. ) 180 tablet 3  . ondansetron (ZOFRAN ODT) 4 MG disintegrating tablet Take 1 tablet (4 mg total)  by mouth every 8 (eight) hours as needed for nausea. (Patient taking differently: Take 4 mg by mouth every 8 (eight) hours as needed for nausea. DISSOLVE IN THE MOUTH) 20 tablet 0  . pantoprazole (PROTONIX) 20 MG tablet Take 1 tablet (20 mg total) by mouth 2 (two) times daily. 60 tablet 3  . promethazine (PHENERGAN) 25 MG tablet Take 1 tablet (25 mg total) by mouth every 6 (six) hours as needed for nausea or vomiting. 30 tablet 1  . rizatriptan (MAXALT-MLT) 10 MG disintegrating tablet Take 1 tablet (10 mg total) by mouth as needed for migraine. May repeat in 2 hours if needed. Do not take more than 3 tablets in a week. 10 tablet 3  . rosuvastatin (CRESTOR) 20 MG tablet Take 1 tablet (20 mg total) by mouth daily. 90 tablet 1  . thiamine 100 MG tablet Take 1 tablet (100 mg total) by mouth daily. 90 tablet 1  . traZODone (DESYREL) 150 MG tablet Take 1 tablet (150 mg total) by mouth at bedtime. 90 tablet 3  . venlafaxine (EFFEXOR) 37.5 MG tablet Take 1 tablet (37.5 mg total) by mouth 2 (two) times daily. 60 tablet 5  . BAYER CONTOUR TEST test strip Testing for up to tid  98   No facility-administered medications prior to visit.  ROS Review of Systems  Constitutional: Positive for unexpected weight change. Negative for appetite change, chills, diaphoresis and fatigue.  HENT: Negative.   Eyes: Positive for visual disturbance.  Respiratory: Negative for cough, chest tightness, shortness of breath and wheezing.   Cardiovascular: Negative for chest pain, palpitations and leg swelling.  Gastrointestinal: Negative for abdominal pain, diarrhea, nausea and vomiting.  Endocrine: Positive for polydipsia, polyphagia and polyuria.  Genitourinary: Negative.  Negative for difficulty urinating and dysuria.  Musculoskeletal: Negative.  Negative for arthralgias, myalgias and neck pain.  Skin: Negative for color change, pallor and rash.  Allergic/Immunologic: Negative.   Neurological: Negative.  Negative for  dizziness, weakness and light-headedness.  Hematological: Negative for adenopathy. Does not bruise/bleed easily.  Psychiatric/Behavioral: Negative.     Objective:  BP 118/78 (BP Location: Left Arm, Patient Position: Sitting, Cuff Size: Large)   Pulse 82   Temp 98.8 F (37.1 C) (Oral)   Resp 16   Ht 5\' 2"  (1.575 m)   Wt 172 lb (78 kg)   LMP 02/12/2014   SpO2 99%   BMI 31.46 kg/m   BP Readings from Last 3 Encounters:  03/12/18 118/78  02/17/18 124/78  12/30/17 122/78    Wt Readings from Last 3 Encounters:  03/12/18 172 lb (78 kg)  02/17/18 168 lb (76.2 kg)  12/30/17 160 lb 1.3 oz (72.6 kg)    Physical Exam  Constitutional: She is oriented to person, place, and time. No distress.  HENT:  Mouth/Throat: Oropharynx is clear and moist. No oropharyngeal exudate.  Eyes: Conjunctivae are normal. Left eye exhibits no discharge. No scleral icterus.  Neck: Normal range of motion. Neck supple. No JVD present. No thyromegaly present.  Cardiovascular: Normal rate, regular rhythm and normal heart sounds. Exam reveals no gallop.  No murmur heard. Pulmonary/Chest: Effort normal and breath sounds normal. No respiratory distress. She has no wheezes. She has no rales.  Abdominal: Soft. Bowel sounds are normal. She exhibits no distension and no mass. There is no tenderness. There is no guarding.  Musculoskeletal: Normal range of motion. She exhibits no edema, tenderness or deformity.  Lymphadenopathy:    She has no cervical adenopathy.  Neurological: She is alert and oriented to person, place, and time.  Skin: Skin is warm and dry. No rash noted. She is not diaphoretic. No erythema. No pallor.  Vitals reviewed.   Lab Results  Component Value Date   WBC 11.1 (H) 12/30/2017   HGB 14.4 12/30/2017   HCT 43.1 12/30/2017   PLT 442.0 (H) 12/30/2017   GLUCOSE 140 (H) 12/30/2017   CHOL 257 (H) 12/30/2017   TRIG 146.0 12/30/2017   HDL 44.80 12/30/2017   LDLDIRECT 152.1 01/19/2013   LDLCALC  183 (H) 12/30/2017   ALT 10 12/30/2017   AST 10 12/30/2017   NA 139 12/30/2017   K 3.7 12/30/2017   CL 102 12/30/2017   CREATININE 0.77 12/30/2017   BUN 10 12/30/2017   CO2 27 12/30/2017   TSH 1.40 12/30/2017   INR 0.98 09/09/2017   HGBA1C 7.4 03/12/2018   MICROALBUR 0.9 12/30/2017    Dg Sinuses Complete  Result Date: 02/17/2018 CLINICAL DATA:  Left facial pain. EXAM: PARANASAL SINUSES - COMPLETE 3 + VIEW COMPARISON:  Brain MR dated 10/01/2017 and CT scan of the head dated 03/17/2017 FINDINGS: The paranasal sinus are aerated. There is no evidence of sinus opacification air-fluid levels or mucosal thickening. Dental caries. IMPRESSION: Normal paranasal sinuses.  Dental caries. Electronically Signed   By: Jeneen Rinks  Maxwell M.D.   On: 02/17/2018 10:36    Assessment & Plan:   Jaydin was seen today for hyperglycemia and diabetes.  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  Will continue metformin at the current dose.  Will add a basal insulin as well as GLP-1 agonist.  Will increase the dose of these over time. -     POCT Glucose (Device for Home Use) -     POCT glycosylated hemoglobin (Hb A1C) -     Insulin Degludec-Liraglutide (XULTOPHY) 100-3.6 UNIT-MG/ML SOPN; Inject 25 Units into the skin daily. -     BAYER CONTOUR TEST test strip; Testing for up to tid -     Amb Referral to Nutrition and Diabetic E   I am having Lihanna B. Cada start on Insulin Degludec-Liraglutide. I am also having her maintain her docusate sodium, albuterol, rizatriptan, metFORMIN, traZODone, amitriptyline, Adjustable Lancing Device, ondansetron, Fremanezumab-vfrm, dicyclomine, AQUALANCE LANCETS 30G, Aclidinium Bromide, rosuvastatin, aspirin EC, thiamine, promethazine, venlafaxine, clonazePAM, pantoprazole, and BAYER CONTOUR TEST.  Meds ordered this encounter  Medications  . Insulin Degludec-Liraglutide  (XULTOPHY) 100-3.6 UNIT-MG/ML SOPN    Sig: Inject 25 Units into the skin daily.    Dispense:  3 mL    Refill:  3  . BAYER CONTOUR TEST test strip    Sig: Testing for up to tid    Dispense:  100 each    Refill:  98     Follow-up: Return in about 3 months (around 06/12/2018).  Scarlette Calico, MD

## 2018-03-12 NOTE — Patient Instructions (Addendum)

## 2018-03-13 ENCOUNTER — Ambulatory Visit: Payer: BLUE CROSS/BLUE SHIELD | Admitting: Family Medicine

## 2018-03-13 ENCOUNTER — Encounter: Payer: Self-pay | Admitting: Family Medicine

## 2018-03-13 DIAGNOSIS — M25512 Pain in left shoulder: Secondary | ICD-10-CM

## 2018-03-13 NOTE — Patient Instructions (Signed)
Please try the exercises  Please try to ice the shoulder  Please try to follow up with me in 4-6 weeks if you have no improvement.

## 2018-03-13 NOTE — Progress Notes (Signed)
Ashley [name] - 52 y.o. female MRN [id number]  Date of birth: 1966-06-29  SUBJECTIVE:  Including CC & ROS.  Chief Complaint  Patient presents with  . Left shoulder pain    Ashley [name] is a 52 y.o. female that is presenting with left shoulder pain. Ongoing for two weeks. Located at the anterior portion near the shoulder. Admits to tingling and numbness. She has decrease range of motion. She has been taking motrin with no improvement. Denies injury or surgeries. No swelling or bruising.    Review of Systems  Constitutional: Negative for fever.  HENT: Negative for congestion.   Respiratory: Negative for cough.   Cardiovascular: Negative for chest pain.  Gastrointestinal: Negative for abdominal pain.  Musculoskeletal: Negative for back pain.  Skin: Negative for color change.  Allergic/Immunologic: Negative for immunocompromised state.  Neurological: Negative for weakness.  Hematological: Negative for adenopathy.  Psychiatric/Behavioral: Negative for agitation.    HISTORY: Past Medical, Surgical, Social, and Family History Reviewed & Updated per EMR.   Pertinent Historical Findings include:  Past Medical History:  Diagnosis Date  . Anxiety   . Bronchitis    uses inhaler if needed for bronchitis, lasted used -2014  . Cataract    bilateral  . Chronic headaches    migraines - in past, uses Phenergan for nausea   . Deficiency anemia 12/30/2017  . Diabetes mellitus without complication ([place])    taken off Metformin since 04/2014, HgbA1C - normal, will follow up with PCP- Dr. Deborra Medina, 07/2014  . GERD (gastroesophageal reflux disease)   . H/O exercise stress test 2011   done at [facility]- told that it was WNL, done due to pt. having panic attacks   . History of blood transfusion 06-13-66   at birth in [place], [place], unsure number of units  . Poor dentition    very poor oral health   . SVD (spontaneous vaginal delivery)    x 3  . Tobacco abuse     Past Surgical History:  Procedure  Laterality Date  . ABDOMINAL HYSTERECTOMY    . APPENDECTOMY  1988  . CARDIAC CATHETERIZATION N/A 08/17/2016   Procedure: Left Heart Cath and Coronary Angiography;  Surgeon: Burnell Blanks, MD;  Location: [facility];  Service: Cardiovascular;  Laterality: N/A;  . CHOLECYSTECTOMY N/A 06/07/2014   Procedure: LAPAROSCOPIC CHOLECYSTECTOMY;  Surgeon: Ralene Ok, MD;  Location: [facility];  Service: General;  Laterality: N/A;  . CHOLECYSTECTOMY  June 07 2014  . COLONOSCOPY  02/16/2014   normal   . ESOPHAGOGASTRODUODENOSCOPY  12/28/2013  . KNEE ARTHROSCOPY  1995   left  . LAPAROSCOPIC ASSISTED VAGINAL HYSTERECTOMY N/A 04/15/2014   Procedure: LAPAROSCOPIC ASSISTED VAGINAL HYSTERECTOMY;  Surgeon: Marylynn Pearson, MD;  Location: [facility];  Service: Gynecology;  Laterality: N/A;  . LAPAROSCOPIC BILATERAL SALPINGO OOPHERECTOMY Bilateral 04/15/2014   Procedure: LAPAROSCOPIC BILATERAL SALPINGO OOPHORECTOMY;  Surgeon: Marylynn Pearson, MD;  Location: [facility];  Service: Gynecology;  Laterality: Bilateral;  . TUBAL LIGATION      Allergies  Allergen Reactions  . Penicillins Anaphylaxis and Swelling    Throat swells Has patient had a PCN reaction causing immediate rash, facial/tongue/throat swelling, SOB or lightheadedness with hypotension: Yes Has patient had a PCN reaction causing severe rash involving mucus membranes or skin necrosis: No Has patient had a PCN reaction that required hospitalization: No Has patient had a PCN reaction occurring within the last 10 years: No If all of the above answers are "NO", then may proceed  with Cephalosporin use.   . Topamax [Topiramate] Other (See Comments)    Hands and feet  go numb    Family History  Problem Relation Age of Onset  . Diabetes Mother   . Hyperlipidemia Mother   . Hypertension Mother   . Anxiety disorder Mother   . Depression Mother   . Drug abuse Mother   . Prostate cancer Father   . Alcohol abuse Father   . Lung cancer Maternal  Grandfather        smoked  . Emphysema Maternal Grandfather        smoked  . COPD Maternal Grandmother        never smoked   . Dementia Maternal Grandmother   . Thyroid cancer Paternal Aunt   . Colon cancer Neg Hx   . Rectal cancer Neg Hx   . Stomach cancer Neg Hx   . Migraines Neg Hx      Social History   Socioeconomic History  . Marital status: Married    Spouse name: Alvester Chou  . Number of children: 6  . Years of education: 36  . Highest education level: Not on file  Occupational History  . Occupation:      Comment: helps run a friends business  Social Needs  . Financial resource strain: Not on file  . Food insecurity:    Worry: Not on file    Inability: Not on file  . Transportation needs:    Medical: Not on file    Non-medical: Not on file  Tobacco Use  . Smoking status: Current Every Day Smoker    Packs/day: 2.00    Years: 35.00    Pack years: 70.00    Types: Cigarettes  . Smokeless tobacco: Never Used  . Tobacco comment: 08-17-17- pt reports smokes "almost 2 packs a day"  Substance and Sexual Activity  . Alcohol use: No    Alcohol/week: 0.0 oz  . Drug use: No  . Sexual activity: Yes    Partners: Male    Birth control/protection: Surgical  Lifestyle  . Physical activity:    Days per week: Not on file    Minutes per session: Not on file  . Stress: Not on file  Relationships  . Social connections:    Talks on phone: Not on file    Gets together: Not on file    Attends religious service: Not on file    Active member of club or organization: Not on file    Attends meetings of clubs or organizations: Not on file    Relationship status: Not on file  . Intimate partner violence:    Fear of current or ex partner: Not on file    Emotionally abused: Not on file    Physically abused: Not on file    Forced sexual activity: Not on file  Other Topics Concern  . Not on file  Social History Narrative   5 living children, one child is a crack cocaine addict who  often breaks into their house to steal money. One child died of cerebral palsy.   Caffeine use: Dr Malachi Bonds (3 per day)   2 cups coffee per day     PHYSICAL EXAM:  VS: BP 126/62 (BP Location: Left Arm, Patient Position: Sitting, Cuff Size: Normal)   Pulse 78   Temp 98.7 F (37.1 C) (Oral)   Ht 5\' 2"  (1.575 m)   Wt 172 lb (78 kg)   LMP 02/12/2014   SpO2 98%  BMI 31.46 kg/m  Physical Exam Gen: NAD, alert, cooperative with exam, well-appearing ENT: normal lips, normal nasal mucosa,  Eye: normal EOM, normal conjunctiva and lids CV:  no edema, +2 pedal pulses   Resp: no accessory muscle use, non-labored,  Skin: no rashes, no areas of induration  Neuro: normal tone, normal sensation to touch Psych:  normal insight, alert and oriented MSK:  Left shoulder:  Normal active abduction and flexion  No swelling or ecchymosis  Normal ER  Normal strength to resistance with IR and ER  Pain with Hawkin's and empty can testing  Neurovascularly intact   Limited ultrasound: left shoulder:  Normal appearing BT  Findings suggestive of subacromial bursitis   Summary: subacromial bursitis   Ultrasound and interpretation by Clearance Coots, MD        Aspiration/Injection Procedure Note [name] 1966-05-10  Procedure: Injection Indications: left shoulder pain   Procedure Details Consent: Risks of procedure as well as the alternatives and risks of each were explained to the (patient/caregiver).  Consent for procedure obtained. Time Out: Verified patient identification, verified procedure, site/side was marked, verified correct patient position, special equipment/implants available, medications/allergies/relevent history reviewed, required imaging and test results available.  Performed.  The area was cleaned with iodine and alcohol swabs.    The left subacromial injection was injected using 1 cc's of 40 mg Depomedrol and 4 cc's of 1% lidocaine with a 25 1 1/2" needle.  Ultrasound was  used. Images were obtained in Long views showing the injection.    A sterile dressing was applied.  Patient did tolerate procedure well.        ASSESSMENT & PLAN:   Left shoulder pain Has a history of similar pain. Findings c/w subacromial bursitis  - injection  - counseled on HEP  - if no improvement would consider nitro, PT, imaging

## 2018-03-15 DIAGNOSIS — M25512 Pain in left shoulder: Secondary | ICD-10-CM | POA: Insufficient documentation

## 2018-03-15 NOTE — Assessment & Plan Note (Signed)
Has a history of similar pain. Findings c/w subacromial bursitis  - injection  - counseled on HEP  - if no improvement would consider nitro, PT, imaging

## 2018-03-17 ENCOUNTER — Other Ambulatory Visit: Payer: Self-pay | Admitting: Family Medicine

## 2018-03-19 NOTE — Telephone Encounter (Signed)
[id number]-Plz see refill req/thx dmf

## 2018-03-20 ENCOUNTER — Encounter: Payer: Self-pay | Admitting: Neurology

## 2018-03-20 ENCOUNTER — Ambulatory Visit (INDEPENDENT_AMBULATORY_CARE_PROVIDER_SITE_OTHER): Payer: BLUE CROSS/BLUE SHIELD | Admitting: Neurology

## 2018-03-20 VITALS — BP 124/82 | HR 83

## 2018-03-20 DIAGNOSIS — G43711 Chronic migraine without aura, intractable, with status migrainosus: Secondary | ICD-10-CM

## 2018-03-20 NOTE — Progress Notes (Signed)
Botox- 100 units x 2 vials Lot: [id number] Expiration: 08/2020 NDC: 8032-1224-82  Bacteriostatic 0.9% Sodium Chloride- 54mL total Lot: [id number] Expiration: 04/24/2019 NDC: 4888-9169-45  Dx: W38.882 B/B //BCrn

## 2018-03-20 NOTE — Progress Notes (Signed)
Interval history 03/20/2018: Patient reports significant improvement of greater than 50% migraine frequency and migraine severity.  Consent Form Botulism Toxin Injection For Chronic Migraine  Reviewed orally with patient, additionally signature is on file:  Botulism toxin has been approved by the Federal drug administration for treatment of chronic migraine. Botulism toxin does not cure chronic migraine and it may not be effective in some patients.  The administration of botulism toxin is accomplished by injecting a small amount of toxin into the muscles of the neck and head. Dosage must be titrated for each individual. Any benefits resulting from botulism toxin tend to wear off after 3 months with a repeat injection required if benefit is to be maintained. Injections are usually done every 3-4 months with maximum effect peak achieved by about 2 or 3 weeks. Botulism toxin is expensive and you should be sure of what costs you will incur resulting from the injection.  The side effects of botulism toxin use for chronic migraine may include:   -Transient, and usually mild, facial weakness with facial injections  -Transient, and usually mild, head or neck weakness with head/neck injections  -Reduction or loss of forehead facial animation due to forehead muscle weakness  -Eyelid drooping  -Dry eye  -Pain at the site of injection or bruising at the site of injection  -Double vision  -Potential unknown long term risks  Contraindications: You should not have Botox if you are pregnant, nursing, allergic to albumin, have an infection, skin condition, or muscle weakness at the site of the injection, or have myasthenia gravis, Lambert-Eaton syndrome, or ALS.  It is also possible that as with any injection, there may be an allergic reaction or no effect from the medication. Reduced effectiveness after repeated injections is sometimes seen and rarely infection at the injection site may occur. All care  will be taken to prevent these side effects. If therapy is given over a long time, atrophy and wasting in the muscle injected may occur. Occasionally the patient's become refractory to treatment because they develop antibodies to the toxin. In this event, therapy needs to be modified.  I have read the above information and consent to the administration of botulism toxin.    BOTOX PROCEDURE NOTE FOR MIGRAINE HEADACHE    Contraindications and precautions discussed with patient(above). Aseptic procedure was observed and patient tolerated procedure. Procedure performed by Dr. Georgia Dom  The condition has existed for more than 6 months, and pt does not have a diagnosis of ALS, Myasthenia Gravis or Lambert-Eaton Syndrome.  Risks and benefits of injections discussed and pt agrees to proceed with the procedure.  Written consent obtained  These injections are medically necessary. Pt  receives good benefits from these injections. These injections do not cause sedations or hallucinations which the oral therapies may cause.  Indication/Diagnosis: chronic migraine BOTOX(J0585) injection was performed according to protocol by [employer]. 200 units of BOTOX was dissolved into 4 cc NS.   NDC: [address]-89   Description of procedure:  The patient was placed in a sitting position. The standard protocol was used for Botox as follows, with 5 units of Botox injected at each site:   -Procerus muscle, midline injection  -Corrugator muscle, bilateral injection  -Frontalis muscle, bilateral injection, with 2 sites each side, medial injection was performed in the upper one third of the frontalis muscle, in the region vertical from the medial inferior edge of the superior orbital rim. The lateral injection was again in the upper one third of the  forehead vertically above the lateral limbus of the cornea, 1.5 cm lateral to the medial injection site.  -Temporalis muscle injection, 4 sites, bilaterally. The first  injection was 3 cm above the tragus of the ear, second injection site was 1.5 cm to 3 cm up from the first injection site in line with the tragus of the ear. The third injection site was 1.5-3 cm forward between the first 2 injection sites. The fourth injection site was 1.5 cm posterior to the second injection site.  -Occipitalis muscle injection, 3 sites, bilaterally. The first injection was done one half way between the occipital protuberance and the tip of the mastoid process behind the ear. The second injection site was done lateral and superior to the first, 1 fingerbreadth from the first injection. The third injection site was 1 fingerbreadth superiorly and medially from the first injection site.  -Cervical paraspinal muscle injection, 2 sites, bilateral knee first injection site was 1 cm from the midline of the cervical spine, 3 cm inferior to the lower border of the occipital protuberance. The second injection site was 1.5 cm superiorly and laterally to the first injection site.  -Trapezius muscle injection was performed at 3 sites, bilaterally. The first injection site was in the upper trapezius muscle halfway between the inflection point of the neck, and the acromion. The second injection site was one half way between the acromion and the first injection site. The third injection was done between the first injection site and the inflection point of the neck.   Will return for repeat injection in 3 months.   A 200 unit sof Botox was used, 155 units were injected, the rest of the Botox was wasted. The patient tolerated the procedure well, there were no complications of the above procedure.

## 2018-04-29 ENCOUNTER — Encounter: Payer: BLUE CROSS/BLUE SHIELD | Attending: Internal Medicine | Admitting: Dietician

## 2018-04-29 ENCOUNTER — Other Ambulatory Visit: Payer: Self-pay | Admitting: Internal Medicine

## 2018-04-29 ENCOUNTER — Telehealth: Payer: Self-pay | Admitting: Internal Medicine

## 2018-04-29 DIAGNOSIS — Z713 Dietary counseling and surveillance: Secondary | ICD-10-CM | POA: Insufficient documentation

## 2018-04-29 DIAGNOSIS — E118 Type 2 diabetes mellitus with unspecified complications: Secondary | ICD-10-CM

## 2018-04-29 MED ORDER — ERTUGLIFLOZIN L-PYROGLUTAMICAC 5 MG PO TABS
1.0000 | ORAL_TABLET | Freq: Every day | ORAL | 1 refills | Status: DC
Start: 2018-04-29 — End: 2018-06-16

## 2018-04-29 NOTE — Telephone Encounter (Signed)
Stop the Xultophy  Switch to Steglatro 1 tab daily I have printed the prescription with a co-pay card to that she can get it for free.

## 2018-04-29 NOTE — Progress Notes (Signed)
Patient was seen on 04/29/18 for the first of a series of three diabetes self-management courses at [facility].  Patient Education Plan per assessed needs and concerns is to attend three course education program for Diabetes Self Management Education.  The following learning objectives were met by the patient during this class:  Describe diabetes  State some common risk factors for diabetes  Defines the role of glucose and insulin  Identifies type of diabetes and pathophysiology  Describe the relationship between diabetes and cardiovascular risk  State the members of the Healthcare Team  States the rationale for glucose monitoring  State when to test glucose  State their individual Target Range  State the importance of logging glucose readings  Describe how to interpret glucose readings  Identifies A1C target  Explain the correlation between A1c and eAG values  State symptoms and treatment of high blood glucose  State symptoms and treatment of low blood glucose  Explain proper technique for glucose testing  Identifies proper sharps disposal  Handouts given during class include:  ADA Diabetes You Take Control   Carb Counting and Meal Planning book  Meal Plan Card  Meal planning worksheet  Low Sodium Flavoring Tips  Types of Fats  The diabetes portion plate  A1c to eAG Conversion Chart  Diabetes Recommended Care Schedule  Support Group  Diabetes Success Plan  Core Class Satisfaction Survey   Follow-Up Plan:  Attend core 2   

## 2018-04-29 NOTE — Telephone Encounter (Signed)
Copied from [place] [phone]. Topic: Quick Communication - Rx Refill/Question >> Apr 29, 2018  8:47 AM [name] wrote: Medication: Insulin Degludec-Liraglutide (XULTOPHY) 100-3.6 UNIT-MG/ML SOPN pt states this med is over $1200 and she will need something else comparable.  [place], [place] - [address] [phone] (Phone) [phone] (Fax)  Pt will be out today. If you have any more samples, that would helpful

## 2018-04-29 NOTE — Telephone Encounter (Signed)
Checked pt formulary and The Xultophy is not on formulary and is excluded. It there an alternative?

## 2018-04-29 NOTE — Telephone Encounter (Signed)
Left detailed message for pt regarding copy card and new rx.   Rx and card have been placed up front for pick up.

## 2018-05-06 ENCOUNTER — Ambulatory Visit: Payer: Self-pay

## 2018-05-12 ENCOUNTER — Telehealth: Payer: Self-pay | Admitting: Family

## 2018-05-12 ENCOUNTER — Ambulatory Visit: Payer: BLUE CROSS/BLUE SHIELD | Admitting: Family

## 2018-05-12 ENCOUNTER — Encounter: Payer: Self-pay | Admitting: Family

## 2018-05-12 VITALS — BP 118/76 | HR 82 | Temp 98.3°F | Ht 62.0 in | Wt 170.0 lb

## 2018-05-12 DIAGNOSIS — G44011 Episodic cluster headache, intractable: Secondary | ICD-10-CM | POA: Diagnosis not present

## 2018-05-12 DIAGNOSIS — E1165 Type 2 diabetes mellitus with hyperglycemia: Secondary | ICD-10-CM | POA: Diagnosis not present

## 2018-05-12 MED ORDER — AMITRIPTYLINE HCL 50 MG PO TABS: 75.0000 mg | ORAL_TABLET | Freq: Every day | ORAL | 1 refills | Status: DC

## 2018-05-12 MED ORDER — INSULIN DETEMIR 100 UNIT/ML ~~LOC~~ SOLN: 10.0000 [IU] | Freq: Every day | SUBCUTANEOUS | 3 refills | Status: DC

## 2018-05-12 MED ORDER — METFORMIN HCL ER 500 MG PO TB24: 500.0000 mg | ORAL_TABLET | Freq: Two times a day (BID) | ORAL | 0 refills | Status: DC

## 2018-05-12 MED ORDER — INSULIN PEN NEEDLE 32G X 6 MM MISC: 1 refills | Status: DC

## 2018-05-12 NOTE — Telephone Encounter (Signed)
I am fine with this- did the pharmacy give her syringes? Let me hear back from her in a few days like we discussed.

## 2018-05-12 NOTE — Telephone Encounter (Signed)
Pt bought 100ct of syringes OTC, she expressed understanding and will call back in a few days.

## 2018-05-12 NOTE — Progress Notes (Signed)
Ashley [name] is a 52 y.o. female with the following history as recorded in EpicCare:  Patient Active Problem List   Diagnosis Date Noted  . Oral lesion 02/17/2018  . Thiamine deficiency 01/05/2018  . Claudication of both lower extremities ([place]) 12/30/2017  . Deficiency anemia 12/30/2017  . Constipation 09/12/2017  . Hypokalemia 05/23/2017  . Moderate episode of recurrent major depressive disorder ([place]) 05/16/2017  . Bilateral leg numbness 04/25/2017  . Diabetes mellitus with complication ([place])   . 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 Paresthesia 03/04/2017  . Tobacco abuse 01/08/2017  . Chronic bronchitis ([place]) 10/20/2015  . Dyslipidemia, goal LDL below 70 04/12/2015  . Migraine with aura, intractable 08/12/2014  . Routine general medical examination at a health care facility 05/24/2011  . Anxiety state 09/30/2009  . INSOMNIA, CHRONIC 09/30/2009  . ADJUSTMENT DISORDER WITH MIXED FEATURES 09/30/2009    Current Outpatient Medications  Medication Sig Dispense Refill  . Aclidinium Bromide (TUDORZA PRESSAIR) 400 MCG/ACT AEPB Inhale 1 puff into the lungs 2 (two) times daily. 1 each 11  . albuterol (PROVENTIL HFA;VENTOLIN HFA) 108 (90 Base) MCG/ACT inhaler Inhale 2 puffs into the lungs every 6 (six) hours as needed for wheezing or shortness of breath.    Marland Kitchen amitriptyline (ELAVIL) 50 MG tablet Take 1.5 tablets (75 mg total) by mouth at bedtime. 135 tablet 1  . AQUALANCE LANCETS 30G MISC   98  . aspirin EC 81 MG tablet Take 1 tablet (81 mg total) by mouth daily. 90 tablet 1  . BAYER CONTOUR TEST test strip Testing for up to tid 100 each 98  . clonazePAM (KLONOPIN) 1 MG tablet TAKE 1 TABLET BY MOUTH TWICE DAILY FOR ANXIETY 60 tablet 3  . dicyclomine (BENTYL) 10 MG capsule TAKE 1 CAPSULE BY MOUTH TWICE DAILY AS NEEDED FOR  CRAMPING  AND  ABDOMINAL  PAIN 180 capsule 1  . docusate sodium (COLACE) 100 MG capsule Take 100 mg by mouth at bedtime.    Marland Kitchen Ertugliflozin L-PyroglutamicAc (STEGLATRO) 5 MG  TABS Take 1 tablet by mouth daily. 90 tablet 1  . Fremanezumab-vfrm ([name]) 225 MG/1.5ML SOSY Inject 1 Syringe into the skin every 30 (thirty) days. 1 Syringe 0  . Lancet Devices (ADJUSTABLE LANCING DEVICE) MISC Up to tid testing  98  . pantoprazole (PROTONIX) 20 MG tablet Take 1 tablet (20 mg total) by mouth 2 (two) times daily. 60 tablet 3  . promethazine (PHENERGAN) 25 MG tablet Take 1 tablet (25 mg total) by mouth every 6 (six) hours as needed for nausea or vomiting. 30 tablet 1  . rizatriptan (MAXALT-MLT) 10 MG disintegrating tablet Take 1 tablet (10 mg total) by mouth as needed for migraine. May repeat in 2 hours if needed. Do not take more than 3 tablets in a week. 10 tablet 3  . rosuvastatin (CRESTOR) 20 MG tablet Take 1 tablet (20 mg total) by mouth daily. 90 tablet 1  . thiamine 100 MG tablet Take 1 tablet (100 mg total) by mouth daily. 90 tablet 1  . traZODone (DESYREL) 150 MG tablet Take 1 tablet (150 mg total) by mouth at bedtime. 90 tablet 3  . venlafaxine (EFFEXOR) 37.5 MG tablet Take 1 tablet (37.5 mg total) by mouth 2 (two) times daily. 60 tablet 5  . insulin detemir (LEVEMIR) 100 UNIT/ML injection Inject 0.1 mLs (10 Units total) into the skin at bedtime. 10 mL 3  . Insulin Pen Needle 32G X 6 MM MISC Use daily as directed 100 each 1  .  metFORMIN (GLUCOPHAGE XR) 500 MG 24 hr tablet Take 1 tablet (500 mg total) by mouth 2 (two) times daily. 60 tablet 0   No current facility-administered medications for this visit.     Allergies: Penicillins and Topamax [topiramate]  Past Medical History:  Diagnosis Date  . Anxiety   . Bronchitis    uses inhaler if needed for bronchitis, lasted used -2014  . Cataract    bilateral  . Chronic headaches    migraines - in past, uses Phenergan for nausea   . Deficiency anemia 12/30/2017  . Diabetes mellitus without complication ([place])    taken off Metformin since 04/2014, HgbA1C - normal, will follow up with PCP- Dr. Deborra Medina, 07/2014  . GERD  (gastroesophageal reflux disease)   . H/O exercise stress test 2011   done at [facility]- told that it was WNL, done due to pt. having panic attacks   . History of blood transfusion 03/27/1966   at birth in [place], [place], unsure number of units  . Poor dentition    very poor oral health   . SVD (spontaneous vaginal delivery)    x 3  . Tobacco abuse     Past Surgical History:  Procedure Laterality Date  . ABDOMINAL HYSTERECTOMY    . APPENDECTOMY  1988  . CARDIAC CATHETERIZATION N/A 08/17/2016   Procedure: Left Heart Cath and Coronary Angiography;  Surgeon: Burnell Blanks, MD;  Location: [facility];  Service: Cardiovascular;  Laterality: N/A;  . CHOLECYSTECTOMY N/A 06/07/2014   Procedure: LAPAROSCOPIC CHOLECYSTECTOMY;  Surgeon: Ralene Ok, MD;  Location: [facility];  Service: General;  Laterality: N/A;  . CHOLECYSTECTOMY  June 07 2014  . COLONOSCOPY  02/16/2014   normal   . ESOPHAGOGASTRODUODENOSCOPY  12/28/2013  . KNEE ARTHROSCOPY  1995   left  . LAPAROSCOPIC ASSISTED VAGINAL HYSTERECTOMY N/A 04/15/2014   Procedure: LAPAROSCOPIC ASSISTED VAGINAL HYSTERECTOMY;  Surgeon: Marylynn Pearson, MD;  Location: [facility];  Service: Gynecology;  Laterality: N/A;  . LAPAROSCOPIC BILATERAL SALPINGO OOPHERECTOMY Bilateral 04/15/2014   Procedure: LAPAROSCOPIC BILATERAL SALPINGO OOPHORECTOMY;  Surgeon: Marylynn Pearson, MD;  Location: [facility];  Service: Gynecology;  Laterality: Bilateral;  . TUBAL LIGATION      Family History  Problem Relation Age of Onset  . Diabetes Mother   . Hyperlipidemia Mother   . Hypertension Mother   . Anxiety disorder Mother   . Depression Mother   . Drug abuse Mother   . Prostate cancer Father   . Alcohol abuse Father   . Lung cancer Maternal Grandfather        smoked  . Emphysema Maternal Grandfather        smoked  . COPD Maternal Grandmother        never smoked   . Dementia Maternal Grandmother   . Thyroid cancer Paternal Aunt   . Colon cancer Neg Hx    . Rectal cancer Neg Hx   . Stomach cancer Neg Hx   . Migraines Neg Hx     Social History   Tobacco Use  . Smoking status: Current Every Day Smoker    Packs/day: 2.00    Years: 35.00    Pack years: 70.00    Types: Cigarettes  . Smokeless tobacco: Never Used  . Tobacco comment: 08-17-17- pt reports smokes "almost 2 packs a day"  Substance Use Topics  . Alcohol use: No    Alcohol/week: 0.0 oz    Subjective:  Patient presents with concerns for continued elevated blood sugars;  was seen in March with similar concerns; Hgba1c at that time was 7.4; was changed to an insulin combination but could not afford; was then changed to [facility] and maintained on Metformin; notes that she is concerned that Metformin is causing nausea and would like to stop this all together if possible;  blood sugars are averaging 200 fasting; she is experiencing urinary frequency, headaches and some blurred vision;    Objective:  Vitals:   05/12/18 1310  BP: 118/76  Pulse: 82  Temp: 98.3 F (36.8 C)  TempSrc: Oral  SpO2: 98%  Weight: 170 lb 0.6 oz (77.1 kg)  Height: 5\' 2"  (1.575 m)    General: Well developed, well nourished, in no acute distress  Skin : Warm and dry.  Head: Normocephalic and atraumatic  Eyes: Sclera and conjunctiva clear; pupils round and reactive to light; extraocular movements intact  Ears: External normal; canals clear; tympanic membranes normal  Oropharynx: Pink, supple. No suspicious lesions  Neck: Supple without thyromegaly, adenopathy  Lungs: Respirations unlabored; clear to auscultation bilaterally without wheeze, rales, rhonchi  CVS exam: normal rate and regular rhythm.  Neurologic: Alert and oriented; speech intact; face symmetrical; moves all extremities well; CNII-XII intact without focal deficit  Assessment:  1. Uncontrolled type 2 diabetes mellitus with hyperglycemia ([place])   2. Intractable episodic cluster headache     Plan:  1. Change Metformin to XR formulation to  see if this helps with nausea; trial of basal insulin- Levemir appears to be the preferred medication on her formulary; start with 10 units at night; call back in 2-3 days with her response and will adjust dosage accordingly; keep planned follow-up with her PCP for next month; 2. Refill on Elavil as requested;   No follow-ups on file.  No orders of the defined types were placed in this encounter.   Requested Prescriptions   Signed Prescriptions Disp Refills  . insulin detemir (LEVEMIR) 100 UNIT/ML injection 10 mL 3    Sig: Inject 0.1 mLs (10 Units total) into the skin at bedtime.  . metFORMIN (GLUCOPHAGE XR) 500 MG 24 hr tablet 60 tablet 0    Sig: Take 1 tablet (500 mg total) by mouth 2 (two) times daily.  . Insulin Pen Needle 32G X 6 MM MISC 100 each 1    Sig: Use daily as directed  . amitriptyline (ELAVIL) 50 MG tablet 135 tablet 1    Sig: Take 1.5 tablets (75 mg total) by mouth at bedtime.

## 2018-05-12 NOTE — Telephone Encounter (Signed)
FYI

## 2018-05-12 NOTE — Patient Instructions (Signed)
Let me hear back from you on Wednesday or Thursday;

## 2018-05-12 NOTE — Telephone Encounter (Signed)
Pt wanted to let us know she received viles of her insulin and not pens. She is going to keep the vile because the price is good for her. She wanted to let Mickel Baas know.

## 2018-05-12 NOTE — Telephone Encounter (Signed)
I called and left message for patient to return call to clinic and let me know if she was provided with syringes today and to also follow up with Mickel Baas in a few days as discussed during the visit today.

## 2018-05-14 ENCOUNTER — Emergency Department (HOSPITAL_COMMUNITY): Payer: BLUE CROSS/BLUE SHIELD

## 2018-05-14 ENCOUNTER — Emergency Department (HOSPITAL_COMMUNITY)
Admission: EM | Admit: 2018-05-14 | Discharge: 2018-05-15 | Disposition: A | Discharge status: Home / Self Care | Payer: BLUE CROSS/BLUE SHIELD | Attending: Emergency Medicine | Admitting: Emergency Medicine

## 2018-05-14 ENCOUNTER — Encounter (HOSPITAL_COMMUNITY): Payer: Self-pay

## 2018-05-14 ENCOUNTER — Other Ambulatory Visit: Payer: Self-pay

## 2018-05-14 ENCOUNTER — Ambulatory Visit: Payer: Self-pay | Admitting: *Deleted

## 2018-05-14 DIAGNOSIS — R42 Dizziness and giddiness: Secondary | ICD-10-CM | POA: Diagnosis present

## 2018-05-14 DIAGNOSIS — F17210 Nicotine dependence, cigarettes, uncomplicated: Secondary | ICD-10-CM | POA: Diagnosis not present

## 2018-05-14 DIAGNOSIS — Z7982 Long term (current) use of aspirin: Secondary | ICD-10-CM | POA: Insufficient documentation

## 2018-05-14 DIAGNOSIS — Z79899 Other long term (current) drug therapy: Secondary | ICD-10-CM | POA: Insufficient documentation

## 2018-05-14 DIAGNOSIS — G43909 Migraine, unspecified, not intractable, without status migrainosus: Secondary | ICD-10-CM | POA: Insufficient documentation

## 2018-05-14 DIAGNOSIS — G43809 Other migraine, not intractable, without status migrainosus: Secondary | ICD-10-CM

## 2018-05-14 DIAGNOSIS — Z794 Long term (current) use of insulin: Secondary | ICD-10-CM | POA: Insufficient documentation

## 2018-05-14 DIAGNOSIS — E119 Type 2 diabetes mellitus without complications: Secondary | ICD-10-CM | POA: Insufficient documentation

## 2018-05-14 LAB — DIFFERENTIAL
Abs Immature Granulocytes: 0 10*3/uL (ref 0.0–0.1)
Basophils Absolute: 0.1 10*3/uL (ref 0.0–0.1)
Basophils Relative: 1 %
EOS ABS: 0.3 10*3/uL (ref 0.0–0.7)
EOS PCT: 3 %
Immature Granulocytes: 0 %
LYMPHS ABS: 2.6 10*3/uL (ref 0.7–4.0)
LYMPHS PCT: 25 %
MONO ABS: 0.7 10*3/uL (ref 0.1–1.0)
MONOS PCT: 7 %
Neutro Abs: 6.4 10*3/uL (ref 1.7–7.7)
Neutrophils Relative %: 64 %

## 2018-05-14 LAB — COMPREHENSIVE METABOLIC PANEL
ALK PHOS: 124 U/L (ref 38–126)
ALT: 24 U/L (ref 14–54)
ANION GAP: 10 (ref 5–15)
AST: 21 U/L (ref 15–41)
Albumin: 3.8 g/dL (ref 3.5–5.0)
BILIRUBIN TOTAL: 0.4 mg/dL (ref 0.3–1.2)
BUN: 5 mg/dL — ABNORMAL LOW (ref 6–20)
CALCIUM: 9.3 mg/dL (ref 8.9–10.3)
CO2: 27 mmol/L (ref 22–32)
Chloride: 103 mmol/L (ref 101–111)
Creatinine, Ser: 0.75 mg/dL (ref 0.44–1.00)
Glucose, Bld: 173 mg/dL — ABNORMAL HIGH (ref 65–99)
POTASSIUM: 3.5 mmol/L (ref 3.5–5.1)
Sodium: 140 mmol/L (ref 135–145)
TOTAL PROTEIN: 7.2 g/dL (ref 6.5–8.1)

## 2018-05-14 LAB — I-STAT TROPONIN, ED: TROPONIN I, POC: 0 ng/mL (ref 0.00–0.08)

## 2018-05-14 LAB — CBC
HEMATOCRIT: 43.4 % (ref 36.0–46.0)
Hemoglobin: 14.3 g/dL (ref 12.0–15.0)
MCH: 29.9 pg (ref 26.0–34.0)
MCHC: 32.9 g/dL (ref 30.0–36.0)
MCV: 90.6 fL (ref 78.0–100.0)
PLATELETS: 378 10*3/uL (ref 150–400)
RBC: 4.79 MIL/uL (ref 3.87–5.11)
RDW: 13.2 % (ref 11.5–15.5)
WBC: 10.1 10*3/uL (ref 4.0–10.5)

## 2018-05-14 LAB — I-STAT CHEM 8, ED
BUN: 4 mg/dL — AB (ref 6–20)
CALCIUM ION: 1.14 mmol/L — AB (ref 1.15–1.40)
CHLORIDE: 101 mmol/L (ref 101–111)
Creatinine, Ser: 0.6 mg/dL (ref 0.44–1.00)
GLUCOSE: 171 mg/dL — AB (ref 65–99)
HCT: 43 % (ref 36.0–46.0)
Hemoglobin: 14.6 g/dL (ref 12.0–15.0)
Potassium: 3.6 mmol/L (ref 3.5–5.1)
Sodium: 141 mmol/L (ref 135–145)
TCO2: 26 mmol/L (ref 22–32)

## 2018-05-14 LAB — I-STAT BETA HCG BLOOD, ED (MC, WL, AP ONLY): HCG, QUANTITATIVE: 6.5 m[IU]/mL — AB (ref <5)

## 2018-05-14 LAB — PROTIME-INR
INR: 0.91
PROTHROMBIN TIME: 12.2 s (ref 11.4–15.2)

## 2018-05-14 LAB — APTT: aPTT: 28 seconds (ref 24–36)

## 2018-05-14 LAB — CBG MONITORING, ED: GLUCOSE-CAPILLARY: 140 mg/dL — AB (ref 65–99)

## 2018-05-14 MED ORDER — KETOROLAC TROMETHAMINE 30 MG/ML IJ SOLN
30.0000 mg | Freq: Once | INTRAMUSCULAR | Status: AC
  Administered 2018-05-14: 30 mg via INTRAVENOUS
  Filled 2018-05-14: qty 1

## 2018-05-14 MED ORDER — MECLIZINE HCL 25 MG PO TABS
25.0000 mg | ORAL_TABLET | Freq: Once | ORAL | Status: AC
  Administered 2018-05-14: 25 mg via ORAL
  Filled 2018-05-14: qty 1

## 2018-05-14 MED ORDER — DIPHENHYDRAMINE HCL 50 MG/ML IJ SOLN
25.0000 mg | Freq: Once | INTRAMUSCULAR | Status: AC
  Administered 2018-05-14: 25 mg via INTRAVENOUS
  Filled 2018-05-14: qty 1

## 2018-05-14 MED ORDER — PROCHLORPERAZINE EDISYLATE 10 MG/2ML IJ SOLN
10.0000 mg | Freq: Once | INTRAMUSCULAR | Status: AC
  Administered 2018-05-14: 10 mg via INTRAVENOUS
  Filled 2018-05-14: qty 2

## 2018-05-14 NOTE — Discharge Instructions (Addendum)

## 2018-05-14 NOTE — ED Provider Notes (Signed)
Patient placed in Quick Look pathway, seen and evaluated   Chief Complaint: Unilateral weakness  HPI:   Ashley [name] is a 52 y.o. female, presenting to the ED with right-sided upper and lower extremity weakness beginning 9 AM this morning accompanied by numbness in the right extremities, dizziness, and bilateral blurred vision.  Headache to the top of the head.  Denies falls/trauma, vision loss, chest pain, shortness of breath, neck pain, fever, abdominal pain.  ROS: Extremity weakness (one)  Physical Exam:   Gen: No distress  Neuro: Awake and Alert  Skin: Warm    Focused Exam:   No diaphoresis.  No pallor.  Pulmonary: No increased work of breathing.  Speaks in full sentences without difficulty.  No tachypnea.  Cardiac: Normal rate and regular. Peripheral pulses intact.  Neurologic:   Patient endorses decreased sensation on the right upper and lower extremities. No noted speech deficits. No aphasia. Patient handles oral secretions without difficulty. No noted swallowing defects. No neglect. Grip strength weaker on the right. Questionable arm drift on the right. Strength 4/5 in the right biceps/triceps, 5/5 on the left. Strength 4/5 in the right knee and ankle, 5/5 on the left. Coordination intact with finger-to-nose testing Cranial nerves III-XII grossly intact.  No noted visual field loss. No facial droop.     Discussed with Dr. Eulis Foster.  Patient outside of the stroke window of 4.5 hours.  LVO negative.  Initiation of care has begun. The patient has been counseled on the process, plan, and necessity for staying for the completion/evaluation, and the remainder of the medical screening examination   Layla Maw 05/14/18 1602    Daleen Bo, MD 05/16/18 1453

## 2018-05-14 NOTE — ED Provider Notes (Signed)
[facility] EMERGENCY DEPARTMENT Provider Note   CSN: [id number] Arrival date & time: 05/14/18  1526     History   Chief Complaint Chief Complaint  Patient presents with  . Dizziness  . Headache    HPI Ashley [name] is a 52 y.o. female.  Who presents the emergency department with headache.  She is a past medical history of chronic headaches and migraine but states that her migraine headaches are generally on the right side of her head.  Patient complains of global headache with pain on the top of the head.  It began around 9 AM this morning and progressively worsened.  She complains of feeling like "bricks or falling on the top of my head."  She complains of blurry vision and feels like she is weak in the right hand.  She denies other focal neurologic deficits.  She has any history of falls or traumas, changes in her vision other than intermittent blurriness, neck pain, rash, fevers.  HPI  Past Medical History:  Diagnosis Date  . Anxiety   . Bronchitis    uses inhaler if needed for bronchitis, lasted used -2014  . Cataract    bilateral  . Chronic headaches    migraines - in past, uses Phenergan for nausea   . Deficiency anemia 12/30/2017  . Diabetes mellitus without complication ([place])    taken off Metformin since 04/2014, HgbA1C - normal, will follow up with PCP- Dr. Deborra Medina, 07/2014  . GERD (gastroesophageal reflux disease)   . H/O exercise stress test 2011   done at [facility]- told that it was WNL, done due to pt. having panic attacks   . History of blood transfusion 10[birth year]21[birth year]66   at birth in [place], [place], unsure number of units  . Poor dentition    very poor oral health   . SVD (spontaneous vaginal delivery)    x 3  . Tobacco abuse     Patient Active Problem List   Diagnosis Date Noted  . Oral lesion 02/17/2018  . Thiamine deficiency 01/05/2018  . Claudication of both lower extremities ([place]) 12/30/2017  . Deficiency anemia 12/30/2017  . Constipation  09/12/2017  . Hypokalemia 05/23/2017  . Moderate episode of recurrent major depressive disorder ([place]) 05/16/2017  . Bilateral leg numbness 04/25/2017  . Diabetes mellitus with complication ([place])   . 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 Paresthesia 03/04/2017  . Tobacco abuse 01/08/2017  . Chronic bronchitis ([place]) 10/20/2015  . Dyslipidemia, goal LDL below 70 04/12/2015  . Migraine with aura, intractable 08/12/2014  . Routine general medical examination at a health care facility 05/24/2011  . Anxiety state 09/30/2009  . INSOMNIA, CHRONIC 09/30/2009  . ADJUSTMENT DISORDER WITH MIXED FEATURES 09/30/2009    Past Surgical History:  Procedure Laterality Date  . ABDOMINAL HYSTERECTOMY    . APPENDECTOMY  1988  . CARDIAC CATHETERIZATION N/A 08/17/2016   Procedure: Left Heart Cath and Coronary Angiography;  Surgeon: Burnell Blanks, MD;  Location: [facility];  Service: Cardiovascular;  Laterality: N/A;  . CHOLECYSTECTOMY N/A 06/07/2014   Procedure: LAPAROSCOPIC CHOLECYSTECTOMY;  Surgeon: Ralene Ok, MD;  Location: [facility];  Service: General;  Laterality: N/A;  . CHOLECYSTECTOMY  June 07 2014  . COLONOSCOPY  02/16/2014   normal   . ESOPHAGOGASTRODUODENOSCOPY  12/28/2013  . KNEE ARTHROSCOPY  1995   left  . LAPAROSCOPIC ASSISTED VAGINAL HYSTERECTOMY N/A 04/15/2014   Procedure: LAPAROSCOPIC ASSISTED VAGINAL HYSTERECTOMY;  Surgeon: Marylynn Pearson, MD;  Location: [facility];  Service: Gynecology;  Laterality: N/A;  . LAPAROSCOPIC BILATERAL SALPINGO OOPHERECTOMY Bilateral 04/15/2014   Procedure: LAPAROSCOPIC BILATERAL SALPINGO OOPHORECTOMY;  Surgeon: Marylynn Pearson, MD;  Location: [name];  Service: Gynecology;  Laterality: Bilateral;  . TUBAL LIGATION       OB History   None      Home Medications    Prior to Admission medications   Medication Sig Start Date End Date Taking? Authorizing Provider  Aclidinium Bromide (TUDORZA PRESSAIR) 400 MCG/ACT AEPB Inhale 1 puff into the  lungs 2 (two) times daily. 12/30/17   Janith Lima, MD  albuterol (PROVENTIL HFA;VENTOLIN HFA) 108 (90 Base) MCG/ACT inhaler Inhale 2 puffs into the lungs every 6 (six) hours as needed for wheezing or shortness of breath.    [provider]  amitriptyline (ELAVIL) 50 MG tablet Take 1.5 tablets (75 mg total) by mouth at bedtime. 05/12/18   Marrian Salvage, [place]  AQUALANCE LANCETS 30G [place]  11/27/17   [provider]  aspirin EC 81 MG tablet Take 1 tablet (81 mg total) by mouth daily. 01/01/18   Janith Lima, MD  BAYER CONTOUR TEST test strip Testing for up to tid 03/12/18   Janith Lima, MD  clonazePAM (KLONOPIN) 1 MG tablet TAKE 1 TABLET BY MOUTH TWICE DAILY FOR ANXIETY 01/06/18   Janith Lima, MD  dicyclomine (BENTYL) 10 MG capsule TAKE 1 CAPSULE BY MOUTH TWICE DAILY AS NEEDED FOR  CRAMPING  AND  ABDOMINAL  PAIN 03/19/18   Janith Lima, MD  docusate sodium (COLACE) 100 MG capsule Take 100 mg by mouth at bedtime.    [provider]  Ertugliflozin L-PyroglutamicAc (STEGLATRO) 5 MG TABS Take 1 tablet by mouth daily. 04/29/18   Janith Lima, MD  Fremanezumab-vfrm ([name]) 225 MG/1.5ML SOSY Inject 1 Syringe into the skin every 30 (thirty) days. 11/20/17   Melvenia Beam, MD  insulin detemir (LEVEMIR) 100 UNIT/ML injection Inject 0.1 mLs (10 Units total) into the skin at bedtime. 05/12/18   Marrian Salvage, FNP  Insulin Pen Needle 32G X 6 MM MISC Use daily as directed 05/12/18   Marrian Salvage, FNP  Lancet Devices (ADJUSTABLE LANCING DEVICE) MISC Up to tid testing 09/19/17   [provider]  metFORMIN (GLUCOPHAGE XR) 500 MG 24 hr tablet Take 1 tablet (500 mg total) by mouth 2 (two) times daily. 05/12/18   Marrian Salvage, FNP  pantoprazole (PROTONIX) 20 MG tablet Take 1 tablet (20 mg total) by mouth 2 (two) times daily. 01/28/18   Janith Lima, MD  promethazine (PHENERGAN) 25 MG tablet Take 1 tablet (25 mg total) by mouth every 6 (six)  hours as needed for nausea or vomiting. 01/06/18   Janith Lima, MD  rizatriptan (MAXALT-MLT) 10 MG disintegrating tablet Take 1 tablet (10 mg total) by mouth as needed for migraine. May repeat in 2 hours if needed. Do not take more than 3 tablets in a week. 07/17/17   Lucille Passy, MD  rosuvastatin (CRESTOR) 20 MG tablet Take 1 tablet (20 mg total) by mouth daily. 12/30/17   Janith Lima, MD  thiamine 100 MG tablet Take 1 tablet (100 mg total) by mouth daily. 01/05/18   Janith Lima, MD  traZODone (DESYREL) 150 MG tablet Take 1 tablet (150 mg total) by mouth at bedtime. 08/12/17   Lucille Passy, MD  venlafaxine (EFFEXOR) 37.5 MG tablet Take 1 tablet (37.5 mg total) by mouth 2 (two) times daily.  01/06/18   Janith Lima, MD    Family History Family History  Problem Relation Age of Onset  . Diabetes Mother   . Hyperlipidemia Mother   . Hypertension Mother   . Anxiety disorder Mother   . Depression Mother   . Drug abuse Mother   . Prostate cancer Father   . Alcohol abuse Father   . Lung cancer Maternal Grandfather        smoked  . Emphysema Maternal Grandfather        smoked  . COPD Maternal Grandmother        never smoked   . Dementia Maternal Grandmother   . Thyroid cancer Paternal Aunt   . Colon cancer Neg Hx   . Rectal cancer Neg Hx   . Stomach cancer Neg Hx   . Migraines Neg Hx     Social History Social History   Tobacco Use  . Smoking status: Current Every Day Smoker    Packs/day: 2.00    Years: 35.00    Pack years: 70.00    Types: Cigarettes  . Smokeless tobacco: Never Used  . Tobacco comment: 08-17-17- pt reports smokes "almost 2 packs a day"  Substance Use Topics  . Alcohol use: No    Alcohol/week: 0.0 oz  . Drug use: No     Allergies   Penicillins and Topamax [topiramate]   Review of Systems Review of Systems  Ten systems reviewed and are negative for acute change, except as noted in the HPI.   Physical Exam Updated Vital Signs BP 120/66    Pulse 68   Temp 98.3 F (36.8 C) (Oral)   Resp 12   Ht 5\' 2"  (1.575 m)   Wt 77.1 kg (170 lb)   LMP 02/12/2014   SpO2 99%   BMI 31.09 kg/m   Physical Exam  Constitutional: She is oriented to person, place, and time. She appears well-developed and well-nourished. No distress.  HENT:  Head: Normocephalic and atraumatic.  Mouth/Throat: Oropharynx is clear and moist.  Eyes: Pupils are equal, round, and reactive to light. Conjunctivae and EOM are normal. No scleral icterus.  No horizontal, vertical or rotational nystagmus  Neck: Normal range of motion. Neck supple.  Full active and passive ROM without pain No midline or paraspinal tenderness No nuchal rigidity or meningeal signs  Cardiovascular: Normal rate, regular rhythm and intact distal pulses.  Pulmonary/Chest: Effort normal and breath sounds normal. No respiratory distress. She has no wheezes. She has no rales.  Abdominal: Soft. Bowel sounds are normal. There is no tenderness. There is no rebound and no guarding.  Musculoskeletal: Normal range of motion.  Lymphadenopathy:    She has no cervical adenopathy.  Neurological: She is alert and oriented to person, place, and time. She has normal reflexes. She displays normal reflexes. No cranial nerve deficit. She exhibits normal muscle tone. Coordination normal. GCS eye subscore is 4. GCS verbal subscore is 5. GCS motor subscore is 6.  Mental Status:  Alert, oriented, thought content appropriate. Speech fluent without evidence of aphasia. Able to follow 2 step commands without difficulty.  Cranial Nerves:  II:  Peripheral visual fields grossly normal, pupils equal, round, reactive to light III,IV, VI: ptosis not present, extra-ocular motions intact bilaterally  V,VII: smile symmetric, facial light touch sensation equal VIII: hearing grossly normal bilaterally  IX,X: midline uvula rise  XI: bilateral shoulder shrug equal and strong XII: midline tongue extension  Motor:  Strength:  Right grip weaker than left, right  proximal arm strength stronger than left.   Sensory: Pinprick and light touch normal in all extremities.  Deep Tendon Reflexes: 2+ and symmetric  Cerebellar: normal finger-to-nose with bilateral upper extremities CV: distal pulses palpable throughout   Skin: Skin is warm and dry. No rash noted. She is not diaphoretic.  Psychiatric: She has a normal mood and affect. Her behavior is normal. Judgment and thought content normal.  Nursing note and vitals reviewed.    ED Treatments / Results  Labs (all labs ordered are listed, but only abnormal results are displayed) Labs Reviewed  COMPREHENSIVE METABOLIC PANEL - Abnormal; Notable for the following components:      Result Value   Glucose, Bld 173 (*)    BUN 5 (*)    All other components within normal limits  CBG MONITORING, ED - Abnormal; Notable for the following components:   Glucose-Capillary 140 (*)    All other components within normal limits  I-STAT CHEM 8, ED - Abnormal; Notable for the following components:   BUN 4 (*)    Glucose, Bld 171 (*)    Calcium, Ion 1.14 (*)    All other components within normal limits  I-STAT BETA HCG BLOOD, ED ([facility], [facility], [facility] ONLY) - Abnormal; Notable for the following components:   I-stat hCG, quantitative 6.5 (*)    All other components within normal limits  PROTIME-INR  APTT  CBC  DIFFERENTIAL  I-STAT TROPONIN, ED    EKG None  Radiology Ct Head Wo Contrast  Result Date: 05/14/2018 CLINICAL DATA:  Dizziness with headache and blurry vision. EXAM: CT HEAD WITHOUT CONTRAST TECHNIQUE: Contiguous axial images were obtained from the base of the skull through the vertex without intravenous contrast. COMPARISON:  10/01/2017 FINDINGS: Brain: There is no evidence for acute hemorrhage, hydrocephalus, mass lesion, or abnormal extra-axial fluid collection. No definite CT evidence for acute infarction. Vascular: No hyperdense vessel or unexpected calcification. Skull: No  evidence for fracture. No worrisome lytic or sclerotic lesion. Sinuses/Orbits: The visualized paranasal sinuses and mastoid air cells are clear. Visualized portions of the globes and intraorbital fat are unremarkable. Other: None. IMPRESSION: Normal exam. Electronically Signed   By: Misty Stanley M.D.   On: 05/14/2018 16:25    Procedures Procedures (including critical care time)  Medications Ordered in ED Medications  ketorolac (TORADOL) 30 MG/ML injection 30 mg (30 mg Intravenous Given 05/14/18 2126)  prochlorperazine (COMPAZINE) injection 10 mg (10 mg Intravenous Given 05/14/18 2124)  diphenhydrAMINE (BENADRYL) injection 25 mg (25 mg Intravenous Given 05/14/18 2127)  meclizine (ANTIVERT) tablet 25 mg (25 mg Oral Given 05/14/18 2124)     Initial Impression / Assessment and Plan / ED Course  I have reviewed the triage vital signs and the nursing notes.  Pertinent labs & imaging results that were available during my care of the patient were reviewed by me and considered in my medical decision making (see chart for details).     Patient's symptoms have resolved completely with treatment for migraine headache.  Her exam is not concerning for subarachnoid hemorrhage, giant cell arteritis, subdural hematoma, or other emergent cause of headache.  No evidence of meningitis.  Patient seen and shared visit with Dr. Eulis Foster who agrees that patient appears appropriate for discharge with close follow-up with her PCP.  Discussed return precautions.  Final Clinical Impressions(s) / ED Diagnoses   Final diagnoses:  None    ED Discharge Orders    None       Margarita Mail, PA-C 05/14/18 2322  Daleen Bo, MD 05/16/18 [phone]

## 2018-05-14 NOTE — ED Provider Notes (Signed)
   Face-to-face evaluation   History: She presents for evaluation of headache, and lightheadedness.  Which started while she was shopping today.  She also complains of bilateral lower extremity numbness, and numbness in the right side of her body.  She has a history of chronic paresthesia, evaluated multiple times in the ED and by neurology.  She has a new physician that is helping her manage her blood sugar which has been high recently.  Physical exam: Alert, calm, cooperative.  No dysarthria or aphasia.  No respiratory distress.  Medical screening examination/treatment/procedure(s) were conducted as a shared visit with non-physician practitioner(s) and myself.  I personally evaluated the patient during the encounter     Daleen Bo, MD 05/16/18 1424

## 2018-05-14 NOTE — ED Triage Notes (Signed)
Pt endorses dizziness, extreme headache, bilateral blurred vision that began at 0900 this morning. Has hx of migraine but states this feels different. VSS. Pt states "I last felt normal at 0900 this morning" Upon exam, grip is less on the right, sensory is less on the right arm and leg. No facial droop, arm drift, or slurred speech/aphasia. Not on blood thinners.

## 2018-05-14 NOTE — ED Notes (Signed)
Ashley [name] in room to see pt, pt is not LVO positive so no code stroke activation due to LSN of 0900

## 2018-05-14 NOTE — Telephone Encounter (Signed)
Called in c/o  Being dizzy and very blurry vision that happened suddenly while in [place] around 11:30am this morning.   She is also c/o a "terrible headache that is different than a migraine".   Also having to hold onto things to ambulate around her house.   Her daughter came and got her from [facility] because she was so dizzy and lightheaded she didn't feel she could drive. She checked her glucose when she got home and it was 238 which is normal for her right now. She last week had some of her oral diabetes medications changed around and was started on insulin.  They are trying to get my glucose numbers down.  I have referred her to the ED.   She is going to [facility].  Her daughter is taking her now.  She can be there in 5 minutes per pt.  I have routed a note to Dr. Ronnald Ramp at [facility] office making him aware of the ED referral.  Reason for Disposition . SEVERE dizziness (e.g., unable to stand, requires support to walk, feels like passing out now)  Answer Assessment - Initial Assessment Questions 1. DESCRIPTION: "Describe your dizziness."     At [place] started going blurry, back of neck sweating real bad.   I called my daughter to come get me.   She got me.   My sugar is 238 which is normal for me right now.   I drank 2 bottles of water. 2. LIGHTHEADED: "Do you feel lightheaded?" (e.g., somewhat faint, woozy, weak upon standing)     I'm having to hold onto stuff when I'm walking around the house...   I have a terrible headache but it's not a migraine.   It feels different.    3. VERTIGO: "Do you feel like either you or the room is spinning or tilting?" (i.e. vertigo)     Lightheadedness and blurry vision. 4. SEVERITY: "How bad is it?"  "Do you feel like you are going to faint?" "Can you stand and walk?"   - MILD - walking normally   - MODERATE - interferes with normal activities (e.g., work, school)    - SEVERE - unable to stand, requires support to walk, feels like passing out  now.      See above.   Holding onto things. 5. ONSET:  "When did the dizziness begin?"     At [facility] about 11:30 this morning. 6. AGGRAVATING FACTORS: "Does anything make it worse?" (e.g., standing, change in head position)     If I get up to walk it's worse. 7. HEART RATE: "Can you tell me your heart rate?" "How many beats in 15 seconds?"  (Note: not all patients can do this)       I have high cholesterol and diabetes.   I recently started using insulin. 8. CAUSE: "What do you think is causing the dizziness?"     I don't know.   My sugar numbers are around my normal and they are working on getting my sugar down.   I started insulin recently.  On Monday I saw a NP, Jodi Mourning and she started me on insulin 10 units.    When I got home my sugar was higher than it was fasting this morning. 9. RECURRENT SYMPTOM: "Have you had dizziness before?" If so, ask: "When was the last time?" "What happened that time?"     No. 10. OTHER SYMPTOMS: "Do you have any other symptoms?" (e.g., fever, chest pain, vomiting,  diarrhea, bleeding)       Just a headache.    Diarrhea since this morning.   Been three times.  No fever or abd pain.   The diarrhea comes and goes. 11. PREGNANCY: "Is there any chance you are pregnant?" "When was your last menstrual period?"       No!  Protocols used: DIZZINESS [facility]

## 2018-05-15 ENCOUNTER — Ambulatory Visit: Payer: BLUE CROSS/BLUE SHIELD | Admitting: Family Medicine

## 2018-05-15 ENCOUNTER — Encounter

## 2018-05-15 ENCOUNTER — Ambulatory Visit (INDEPENDENT_AMBULATORY_CARE_PROVIDER_SITE_OTHER)
Admission: RE | Admit: 2018-05-15 | Discharge: 2018-05-15 | Disposition: A | Discharge status: Home / Self Care | Payer: BLUE CROSS/BLUE SHIELD | Source: Ambulatory Visit | Attending: Family Medicine | Admitting: Family Medicine

## 2018-05-15 ENCOUNTER — Encounter: Payer: Self-pay | Admitting: Family Medicine

## 2018-05-15 VITALS — BP 142/83 | HR 74 | Temp 98.3°F | Ht 62.0 in | Wt 170.0 lb

## 2018-05-15 DIAGNOSIS — M25512 Pain in left shoulder: Secondary | ICD-10-CM

## 2018-05-15 MED ORDER — DICLOFENAC SODIUM 2 % TD SOLN: 1.0000 "application " | Freq: Two times a day (BID) | TRANSDERMAL | 3 refills | Status: DC

## 2018-05-15 NOTE — Assessment & Plan Note (Signed)
No improvement with subacromial injection. Possible for capsulitis with pain with ER.  - referral to PT  - pennsaid  - if no improvement consider GH injection

## 2018-05-15 NOTE — Progress Notes (Signed)
Ashley [name] - 52 y.o. female MRN [id number]  Date of birth: August 01, 1966  SUBJECTIVE:  Including CC & ROS.  Chief Complaint  Patient presents with  . Left shoulder pain    Ashley [name] is a 52 y.o. female that is presenting with left shoulder pain. Pain has been ongoing for two months. She was seen 03/13/18 for same pain and received a subacromial injection with no improvement. Admits to pain when she raises her arm. Has limited range of motion. Flexion and extension trigger stabbing pain. She is not currently taking anything for the pain. She has been completing the exercises provided at her last visit but not daily.   She was evaluated in the ER last night for migraines, her symptoms have resolved completely with treatment for migraine headache   Review of Systems  Constitutional: Negative for fever.  HENT: Negative for congestion.   Respiratory: Negative for cough.   Cardiovascular: Negative for chest pain.  Gastrointestinal: Negative for abdominal pain.  Musculoskeletal: Negative for back pain.  Skin: Negative for color change.  Neurological: Negative for weakness.  Hematological: Negative for adenopathy.  Psychiatric/Behavioral: Negative for agitation.    HISTORY: Past Medical, Surgical, Social, and Family History Reviewed & Updated per EMR.   Pertinent Historical Findings include:  Past Medical History:  Diagnosis Date  . Anxiety   . Bronchitis    uses inhaler if needed for bronchitis, lasted used -2014  . Cataract    bilateral  . Chronic headaches    migraines - in past, uses Phenergan for nausea   . Deficiency anemia 12/30/2017  . Diabetes mellitus without complication ([place])    taken off Metformin since 04/2014, HgbA1C - normal, will follow up with PCP- Dr. Deborra Medina, 07/2014  . GERD (gastroesophageal reflux disease)   . H/O exercise stress test 2011   done at [facility]- told that it was WNL, done due to pt. having panic attacks   . History of blood transfusion Apr 03, 1966   at birth in [place], [place], unsure number of units  . Poor dentition    very poor oral health   . SVD (spontaneous vaginal delivery)    x 3  . Tobacco abuse     Past Surgical History:  Procedure Laterality Date  . ABDOMINAL HYSTERECTOMY    . APPENDECTOMY  1988  . CARDIAC CATHETERIZATION N/A 08/17/2016   Procedure: Left Heart Cath and Coronary Angiography;  Surgeon: Burnell Blanks, MD;  Location: [facility];  Service: Cardiovascular;  Laterality: N/A;  . CHOLECYSTECTOMY N/A 06/07/2014   Procedure: LAPAROSCOPIC CHOLECYSTECTOMY;  Surgeon: Ralene Ok, MD;  Location: [facility];  Service: General;  Laterality: N/A;  . CHOLECYSTECTOMY  June 07 2014  . COLONOSCOPY  02/16/2014   normal   . ESOPHAGOGASTRODUODENOSCOPY  12/28/2013  . KNEE ARTHROSCOPY  1995   left  . LAPAROSCOPIC ASSISTED VAGINAL HYSTERECTOMY N/A 04/15/2014   Procedure: LAPAROSCOPIC ASSISTED VAGINAL HYSTERECTOMY;  Surgeon: Marylynn Pearson, MD;  Location: [facility];  Service: Gynecology;  Laterality: N/A;  . LAPAROSCOPIC BILATERAL SALPINGO OOPHERECTOMY Bilateral 04/15/2014   Procedure: LAPAROSCOPIC BILATERAL SALPINGO OOPHORECTOMY;  Surgeon: Marylynn Pearson, MD;  Location: [facility];  Service: Gynecology;  Laterality: Bilateral;  . TUBAL LIGATION      Allergies  Allergen Reactions  . Penicillins Anaphylaxis and Swelling    Throat swells Has patient had a PCN reaction causing immediate rash, facial/tongue/throat swelling, SOB or lightheadedness with hypotension: Yes Has patient had a PCN reaction causing severe rash involving mucus  membranes or skin necrosis: No Has patient had a PCN reaction that required hospitalization: No Has patient had a PCN reaction occurring within the last 10 years: No If all of the above answers are "NO", then may proceed with Cephalosporin use.   . Topamax [Topiramate] Other (See Comments)    Hands and feet  go numb    Family History  Problem Relation Age of Onset  . Diabetes Mother   .  Hyperlipidemia Mother   . Hypertension Mother   . Anxiety disorder Mother   . Depression Mother   . Drug abuse Mother   . Prostate cancer Father   . Alcohol abuse Father   . Lung cancer Maternal Grandfather        smoked  . Emphysema Maternal Grandfather        smoked  . COPD Maternal Grandmother        never smoked   . Dementia Maternal Grandmother   . Thyroid cancer Paternal Aunt   . Colon cancer Neg Hx   . Rectal cancer Neg Hx   . Stomach cancer Neg Hx   . Migraines Neg Hx      Social History   Socioeconomic History  . Marital status: Married    Spouse name: Alvester Chou  . Number of children: 6  . Years of education: 66  . Highest education level: Not on file  Occupational History  . Occupation:      Comment: helps run a friends business  Social Needs  . Financial resource strain: Not on file  . Food insecurity:    Worry: Not on file    Inability: Not on file  . Transportation needs:    Medical: Not on file    Non-medical: Not on file  Tobacco Use  . Smoking status: Current Every Day Smoker    Packs/day: 2.00    Years: 35.00    Pack years: 70.00    Types: Cigarettes  . Smokeless tobacco: Never Used  . Tobacco comment: 08-17-17- pt reports smokes "almost 2 packs a day"  Substance and Sexual Activity  . Alcohol use: No    Alcohol/week: 0.0 oz  . Drug use: No  . Sexual activity: Yes    Partners: Male    Birth control/protection: Surgical  Lifestyle  . Physical activity:    Days per week: Not on file    Minutes per session: Not on file  . Stress: Not on file  Relationships  . Social connections:    Talks on phone: Not on file    Gets together: Not on file    Attends religious service: Not on file    Active member of club or organization: Not on file    Attends meetings of clubs or organizations: Not on file    Relationship status: Not on file  . Intimate partner violence:    Fear of current or ex partner: Not on file    Emotionally abused: Not on file     Physically abused: Not on file    Forced sexual activity: Not on file  Other Topics Concern  . Not on file  Social History Narrative   5 living children, one child is a crack cocaine addict who often breaks into their house to steal money. One child died of cerebral palsy.   Caffeine use: Dr Malachi Bonds (3 per day)   2 cups coffee per day     PHYSICAL EXAM:  VS: BP (!) 142/83 (BP Location: Left Arm, Patient Position:  Sitting, Cuff Size: Normal)   Pulse 74   Temp 98.3 F (36.8 C) (Oral)   Ht 5\' 2"  (1.575 m)   Wt 170 lb (77.1 kg)   LMP 02/12/2014   SpO2 96%   BMI 31.09 kg/m  Physical Exam Gen: NAD, alert, cooperative with exam, well-appearing ENT: normal lips, normal nasal mucosa,  Eye: normal EOM, normal conjunctiva and lids CV:  no edema, +2 pedal pulses   Resp: no accessory muscle use, non-labored,  Skin: no rashes, no areas of induration  Neuro: normal tone, normal sensation to touch Psych:  normal insight, alert and oriented MSK:  Left Shoulder: Inspection reveals no abnormalities, atrophy or asymmetry. Palpation is normal with no tenderness over AC joint ROM is full in all planes. Rotator cuff strength normal throughout. Pain with Hawkin's tests, empty can sign. Speeds and Yergason's tests normal. Neurovascularly intact     ASSESSMENT & PLAN:   Left shoulder pain No improvement with subacromial injection. Possible for capsulitis with pain with ER.  - referral to PT  - pennsaid  - if no improvement consider GH injection

## 2018-05-15 NOTE — Patient Instructions (Signed)
Please try the pennsaid  Please follow up after a few weeks of physical therapy and we'll see how your pain is.

## 2018-05-20 ENCOUNTER — Ambulatory Visit: Payer: Self-pay | Admitting: Family Medicine

## 2018-05-26 ENCOUNTER — Ambulatory Visit: Payer: Self-pay

## 2018-05-26 ENCOUNTER — Telehealth: Payer: Self-pay

## 2018-05-26 ENCOUNTER — Other Ambulatory Visit: Payer: Self-pay | Admitting: Internal Medicine

## 2018-05-26 DIAGNOSIS — F411 Generalized anxiety disorder: Secondary | ICD-10-CM

## 2018-05-26 DIAGNOSIS — M25512 Pain in left shoulder: Secondary | ICD-10-CM

## 2018-05-26 NOTE — Telephone Encounter (Signed)
Pt called for left shoulder xray report. Pt states that her pain now involves her left shoulder down to the elbow to the hand.  Pt has been seen in the office x 2 for the sx she is having. First visit pt 03/13/18 and the shoulder was injected. 2nd visit  Was 05/15/18 and xray was taken and was rx Pennsaid for the pain. Pt states that she has been on the Pennsaid for 5 days without relief.  Pt states that her pain is constat and rates it a 5/10 and when she has to use it her pain is a 10/10. Pt states she is having decreased ROM to the shoulder.  Pt refused an appointment but wanted a note sent back to her PCP regarding this pain and wants to know what to do next. Care advice given per protocol. Routing note high priority.   Reason for Disposition . [1] MODERATE pain (e.g., interferes with normal activities) AND [2] present > 3 days  Answer Assessment - Initial Assessment Questions 1. ONSET: "When did the pain start?"     02/27/18 2. LOCATION: "Where is the pain located?"     Top of shoulder down to elbow and to the left hand 3. PAIN: "How bad is the pain?" (Scale 1-10; or mild, moderate, severe)   - MILD (1-3): doesn't interfere with normal activities   - MODERATE (4-7): interferes with normal activities (e.g., work or school) or awakens from sleep   - SEVERE (8-10): excruciating pain, unable to do any normal activities, unable to move arm at all due to pain     Constant 5 with intermittent 4. WORK OR EXERCISE: "Has there been any recent work or exercise that involved this part of the body?"     no 5. CAUSE: "What do you think is causing the shoulder pain?"     Thought was bursitis got a shot 3/21 kept getting worse and was seen again end of May and got xray 6. OTHER SYMPTOMS: "Do you have any other symptoms?" (e.g., neck pain, swelling, rash, fever, numbness, weakness)     No neck pain, occasional numbness,  And having weakness 7. PREGNANCY: "Is there any chance you are pregnant?" "When was your  last menstrual period?"     n/a  Protocols used: SHOULDER PAIN-A-AH

## 2018-05-26 NOTE — Telephone Encounter (Signed)
We received a prior authorization request for the [name]. I have completed and submitted the PA on Cover My Meds and should have a determination within 48-72 hours.  Cover My Meds Key: [id number]

## 2018-05-27 NOTE — Addendum Note (Signed)
Addended by: Verlene Mayer A on: 05/27/2018 05:37 PM   Modules accepted: Orders

## 2018-05-27 NOTE — Telephone Encounter (Signed)
Dr. Raeford Razor is out of the office next week but would recommend following up with him to discuss next step of either pT or MRI

## 2018-05-27 NOTE — Telephone Encounter (Signed)
Spoke with patient regarding PT, a referral was placed on 05/15/18, she was unable to be reached per PT. She declined physical therapy at this time and does not want to try a [place] injection per Dr. Doristine Locks note on 05/15/18. She would like to proceed with MRI at this time. Will place an order for MRI.

## 2018-05-27 NOTE — Telephone Encounter (Signed)
Can you help with this? Patient has seen Dr Raeford Razor both times.

## 2018-05-29 ENCOUNTER — Other Ambulatory Visit: Payer: Self-pay | Admitting: *Deleted

## 2018-05-29 ENCOUNTER — Other Ambulatory Visit: Payer: Self-pay | Admitting: Neurology

## 2018-05-29 DIAGNOSIS — G43711 Chronic migraine without aura, intractable, with status migrainosus: Secondary | ICD-10-CM

## 2018-05-29 MED ORDER — ERENUMAB-AOOE 140 MG/ML ~~LOC~~ SOAJ: 140.0000 mg | SUBCUTANEOUS | 11 refills | Status: DC

## 2018-05-29 NOTE — Telephone Encounter (Signed)
Received denial letter from [facility] due to the following reasons:  [name] is not on formulary and will not be approved when it is taken with Botox or when the member has received Botox in the last 3 months. Also, [name] is covered when two alternative medications on the member's formulary have been tried and did not work. In this case, the member will be started on Botox or has received Botox within the last 3 months and none of the alternative medications have been tried. Alternative medications include: Aimovig and Emgality.

## 2018-05-29 NOTE — Telephone Encounter (Signed)
Let patient know we have to switch to another brand of this med, Aimovig, bc [name] was not on formulary. Unclear if they will approve Aimovig but we can try.

## 2018-05-29 NOTE — Progress Notes (Signed)
Spoke with patient. Discussed that [name] is not covered by insurance and also not covered since she is getting Botox. Aimovig is preferred over [name] however we do not know if they will approve this either since she is on Botox. Dr. Jaynee Eagles has ordered it though and we will try. Pt will switch to the Aimovig. It is administered slightly different than the [name] as it has a button that pt will press at the top to administer. Other directions are similar and this will come with a detailed instruction sheet with pictures. Aimovig is also refrigerated, just let sit at room temp for 30 minutes before administering. Lastly, pt was directed to the [email or web address] to download a savings card as this should be free as well. If pt has any questions at all she is encouraged to call us. She verbalized understanding and appreciation and is aware that he Aimoivig should be started on the day that her next [name] injection is due.   Called pt's pharmacy and canceled [name].

## 2018-05-29 NOTE — Telephone Encounter (Signed)
Spoke with patient. Discussed that [name] is not covered by insurance and also not covered since she is getting Botox. Aimovig is preferred over [name] however we do not know if they will approve this either since she is on Botox. Dr. Jaynee Eagles has ordered it though and we will try. Pt will switch to the Aimovig. It is administered slightly different than the [name] as it has a button that pt will press at the top to administer. Other directions are similar and this will come with a detailed instruction sheet with pictures. Aimovig is also refrigerated, just let sit at room temp for 30 minutes before administering. Lastly, pt was directed to the [email or web address] to download a savings card as this should be free as well. If pt has any questions at all she is encouraged to call us. She verbalized understanding and appreciation and is aware that he Aimoivig should be started on the day that her next [name] injection is due.   Called pt's pharmacy and canceled [name].

## 2018-05-29 NOTE — Telephone Encounter (Signed)
Per cover my meds, [employer] has denied the [name]. No explanation given but [employer] should send denial letter with explanation.

## 2018-05-30 ENCOUNTER — Telehealth: Payer: Self-pay | Admitting: Internal Medicine

## 2018-05-30 NOTE — Telephone Encounter (Signed)
Let's try 30 units at night and actually have her do 10 in the am; let me hear back from her in the next week or so; Thanks

## 2018-05-30 NOTE — Telephone Encounter (Signed)
Spoke with patient and she was able to give me the following readings:  14 average day reading was 244  This morning it was 298  Thurs morning: 198 Thurs evening: 272  Wed morning: 184 Wed evening: 278  Tues morning: 243 Tues evening: 287

## 2018-05-30 NOTE — Telephone Encounter (Signed)
Pt called and stated that BS are still high. Pt saw Mickel Baas on 05/12/2018.

## 2018-05-30 NOTE — Telephone Encounter (Signed)
I need to know what type of readings she has so we can adjust insulin accordingly. Thanks-

## 2018-05-30 NOTE — Telephone Encounter (Signed)
Copied from [place] [id number]. Topic: Quick Communication - See Telephone Encounter >> May 30, 2018 11:14 AM Aurelio Brash B wrote: CRM for notification. See Telephone encounter for: 05/30/18.  PT states her blood sugar is still running high , she is currently taking 25 units of insulin, she wants to know if she needs to increase her insulin

## 2018-06-02 ENCOUNTER — Telehealth: Payer: Self-pay

## 2018-06-02 ENCOUNTER — Other Ambulatory Visit: Payer: Self-pay

## 2018-06-02 NOTE — Telephone Encounter (Signed)
PA done on cover my meds. 

## 2018-06-02 NOTE — Telephone Encounter (Signed)
Left vm for patient about Aimovig PA.

## 2018-06-02 NOTE — Telephone Encounter (Signed)
Per previous message patient states she has 8 headaches per month and more when it is closer to having Botox.

## 2018-06-02 NOTE — Telephone Encounter (Signed)
If patient calls back please document and ask her how many headaches does she have per month with taking the botox every three months. This is for the PA on Aimovig.

## 2018-06-02 NOTE — Telephone Encounter (Signed)
Spoke with patient today and info given. She knows to call us back next week with readings after making adjustments.

## 2018-06-03 NOTE — Telephone Encounter (Signed)
Rn receive fax from [facility]. It states pt does not meet definition of medical necessity. Denial letter states will not be approve when its taken with botox or when the member has receive botox in the last 3 months.

## 2018-06-03 NOTE — Telephone Encounter (Signed)
LEft vm for patient that her aimovig was denied because she is currently receiving botox for her chronic headaches. Rn left vm for patient to get access card for aimovig to get medication for free or discount price. Rn will try to call patient tomorrow.

## 2018-06-04 ENCOUNTER — Other Ambulatory Visit: Payer: Self-pay | Admitting: Internal Medicine

## 2018-06-10 IMAGING — DX DG CERVICAL SPINE COMPLETE 4+V
5 series · 5 of 5 positions shown · non-contrast
Comparison: Neck CT 02/07/2016.

CLINICAL DATA: 51-year-old female with left side neck pain for 3
days with no known injury.

EXAM:
CERVICAL SPINE - COMPLETE 4+ VIEW

[c-spine lat]
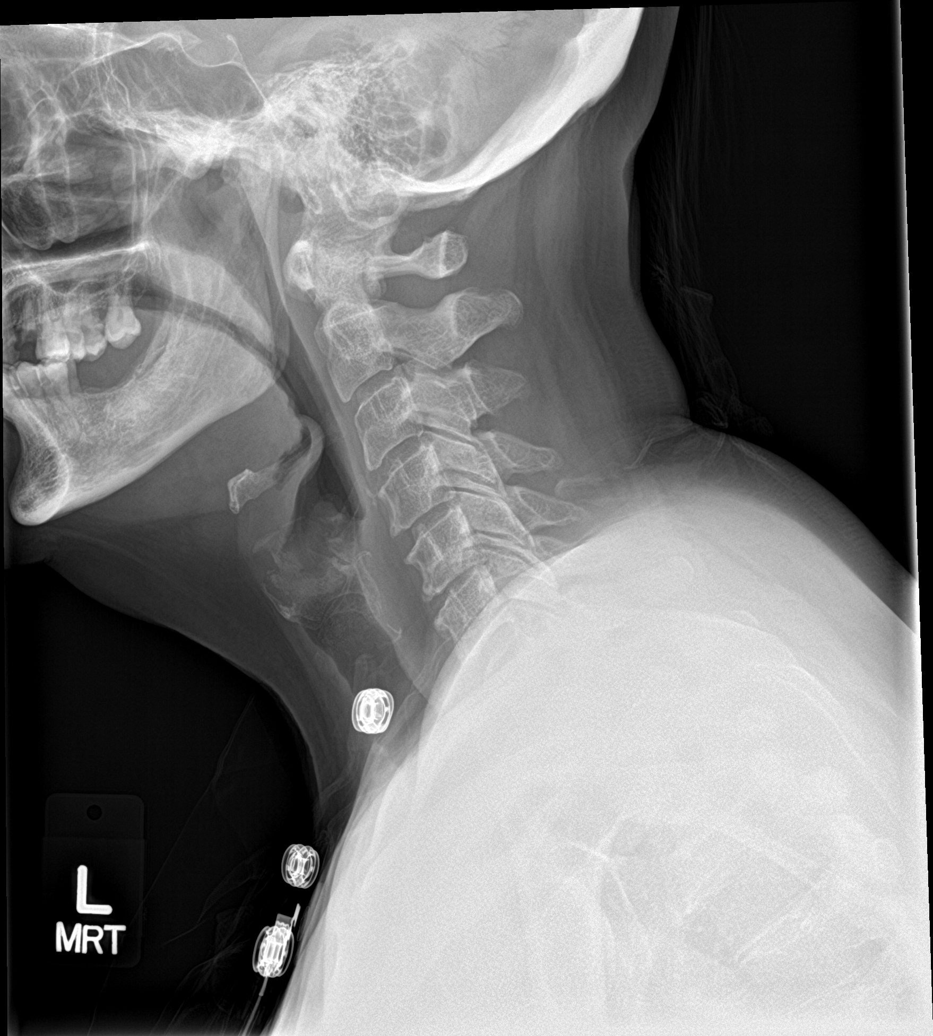

[c-spine obl (1 of 2)]
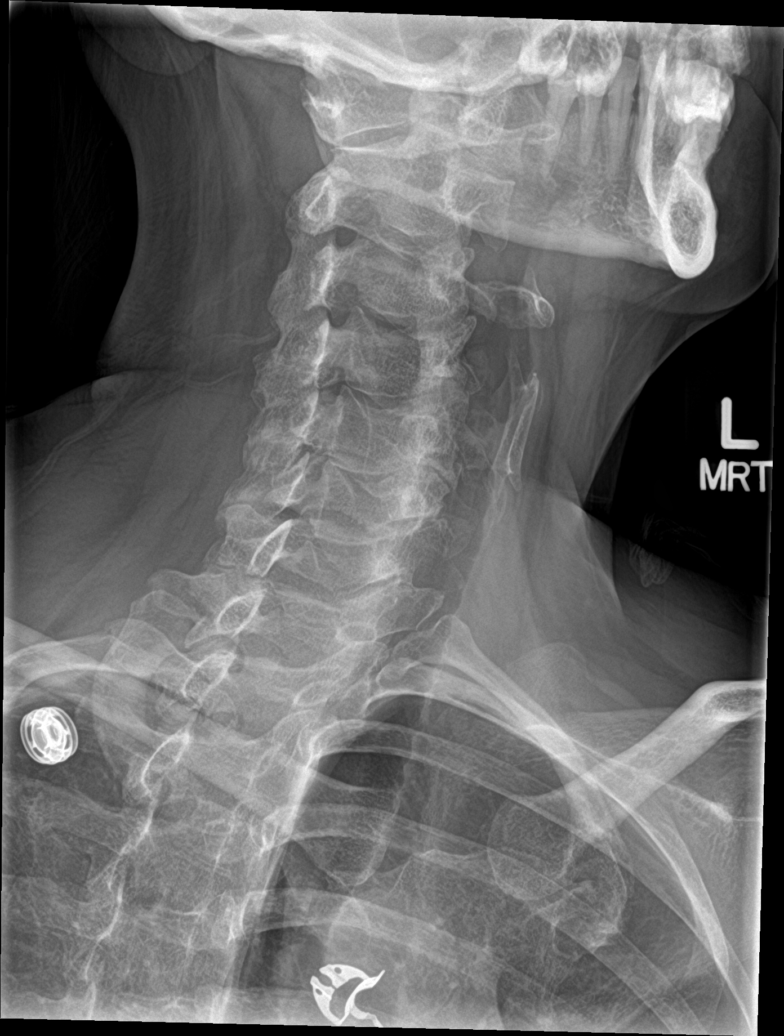

[c-spine obl (2 of 2)]
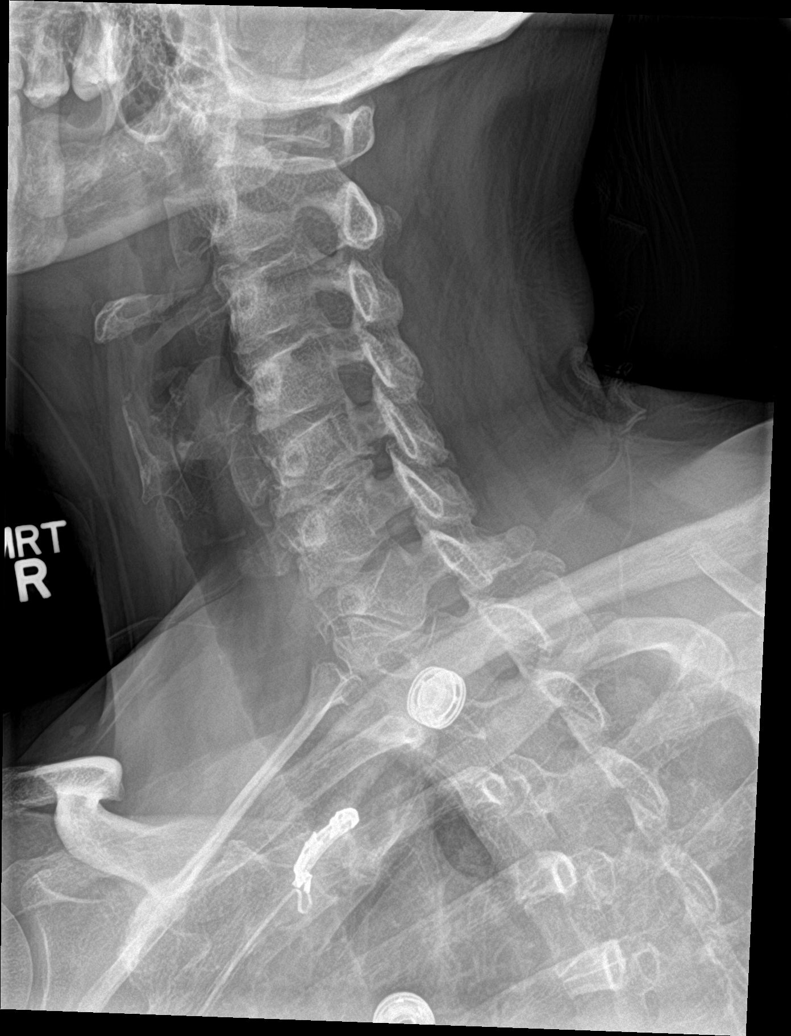

[c-spine ap]
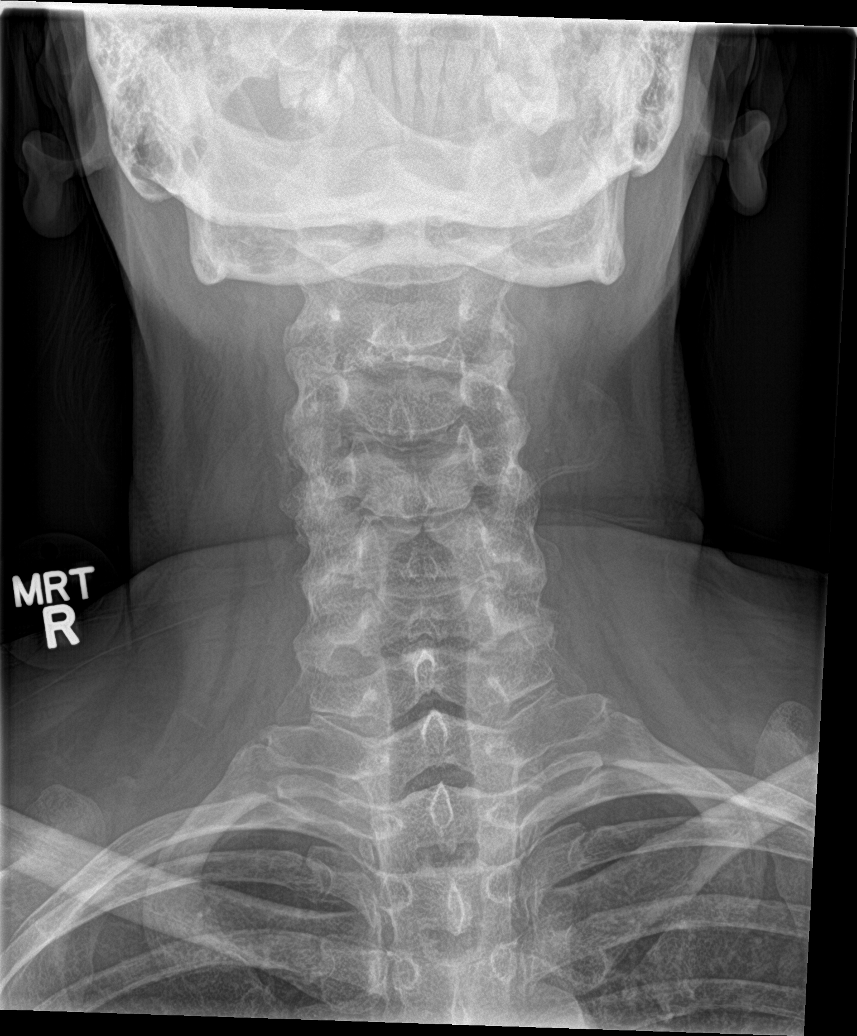

[c-spine open mouth]
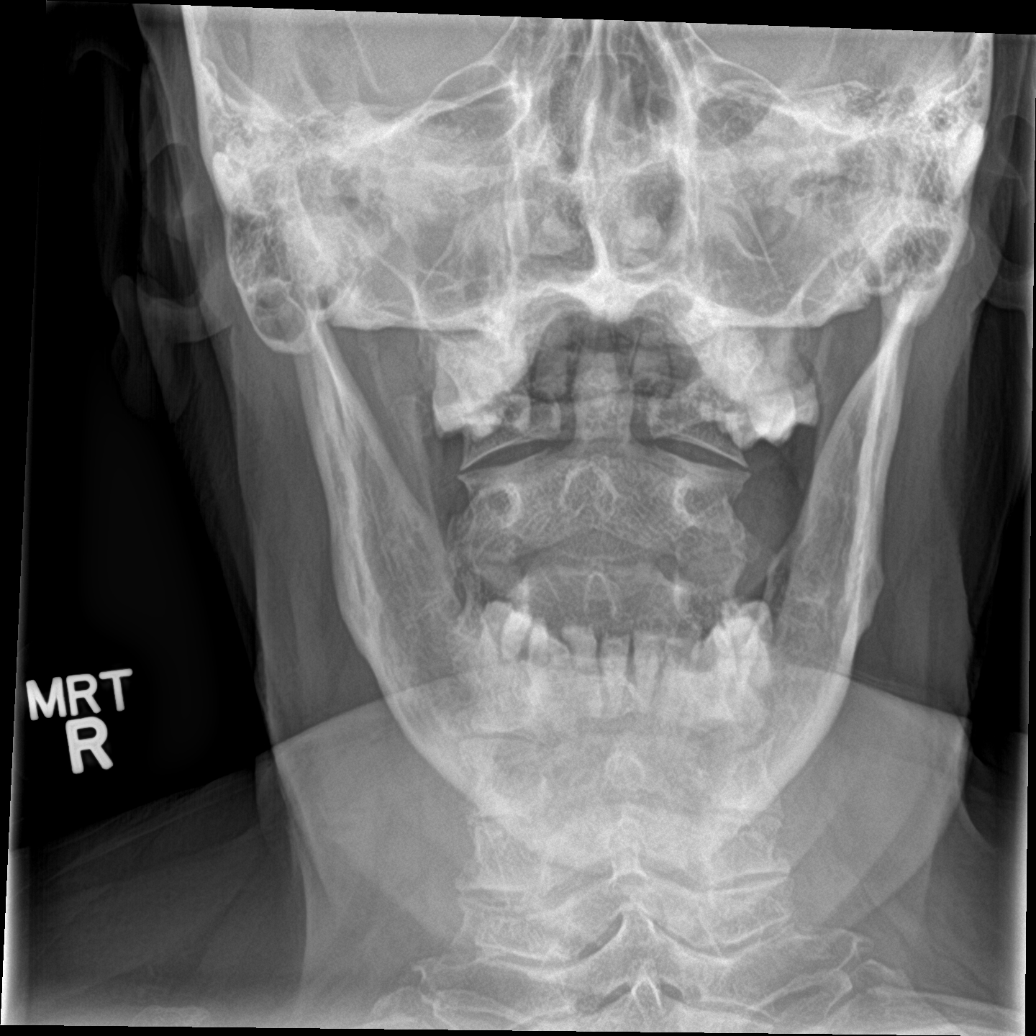

[5 of 5 positions shown; findings below may reference images not displayed]

FINDINGS: Normal prevertebral soft tissue contour. Chronic straightening of
cervical lordosis. Chronic C5-C6 disc space loss and mild anterior
lower cervical endplate osteophytes. Bilateral posterior element
alignment is within normal limits. Cervicothoracic junction
alignment is within normal limits. Normal C1-C2 alignment and joint
space. Normal AP alignment, odontoid, and lung apices. No acute
osseous abnormality identified.
IMPRESSION: No acute osseous abnormality in the cervical spine. Chronic C5-C6
disc space loss and mild lower cervical endplate spurring.

## 2018-06-16 ENCOUNTER — Encounter: Payer: Self-pay | Admitting: Internal Medicine

## 2018-06-16 ENCOUNTER — Telehealth: Payer: Self-pay | Admitting: Neurology

## 2018-06-16 ENCOUNTER — Ambulatory Visit: Payer: BLUE CROSS/BLUE SHIELD | Admitting: Internal Medicine

## 2018-06-16 VITALS — BP 130/80 | HR 78 | Temp 98.4°F | Resp 16 | Ht 62.0 in | Wt 177.5 lb

## 2018-06-16 DIAGNOSIS — E1165 Type 2 diabetes mellitus with hyperglycemia: Secondary | ICD-10-CM | POA: Diagnosis not present

## 2018-06-16 DIAGNOSIS — R079 Chest pain, unspecified: Secondary | ICD-10-CM

## 2018-06-16 LAB — HM DIABETES FOOT EXAM

## 2018-06-16 LAB — POCT GLYCOSYLATED HEMOGLOBIN (HGB A1C): HEMOGLOBIN A1C: 7.8 % — AB (ref 4.0–5.6)

## 2018-06-16 LAB — POCT GLUCOSE (DEVICE FOR HOME USE): Glucose Fasting, POC: 210 mg/dL — AB (ref 70–99)

## 2018-06-16 MED ORDER — ERTUGLIFLOZIN-METFORMIN HCL 7.5-1000 MG PO TABS: 1.0000 | ORAL_TABLET | Freq: Two times a day (BID) | ORAL | 1 refills | Status: DC

## 2018-06-16 MED ORDER — INSULIN DETEMIR 100 UNIT/ML ~~LOC~~ SOLN
30.0000 [IU] | Freq: Every day | SUBCUTANEOUS | 3 refills | Status: DC
Start: 2018-06-16 — End: 2019-02-24

## 2018-06-16 NOTE — Progress Notes (Signed)
Subjective:  Patient ID: Ashley [name], female    DOB: 1966-08-22  Age: 52 y.o. MRN: [id number]  CC: Diabetes   HPI Ashley [name] presents for f/up - she complains that her blood sugars have not been well controlled.  She is frequently having blood sugars that are approaching 300.  She saw another provider about a month ago and was started on basal insulin at 10 units a day.  She denies visual disturbance, polydipsia, or polyuria.   She also tells me that 2 days ago she was sitting at rest and she had an episode of discomfort in her chest.  Describes it as an achy sensation that was not associated with activity.  She has chronic, unchanged SOB and DOE.  She denies diaphoresis, dizziness, lightheadedness, fatigue, or edema.  Outpatient Medications Prior to Visit  Medication Sig Dispense Refill  . Aclidinium Bromide (TUDORZA PRESSAIR) 400 MCG/ACT AEPB Inhale 1 puff into the lungs 2 (two) times daily. 1 each 11  . albuterol (PROVENTIL HFA;VENTOLIN HFA) 108 (90 Base) MCG/ACT inhaler Inhale 2 puffs into the lungs every 6 (six) hours as needed for wheezing or shortness of breath.    Marland Kitchen amitriptyline (ELAVIL) 50 MG tablet Take 1.5 tablets (75 mg total) by mouth at bedtime. 135 tablet 1  . AQUALANCE LANCETS 30G MISC   98  . aspirin EC 81 MG tablet Take 1 tablet (81 mg total) by mouth daily. 90 tablet 1  . BAYER CONTOUR TEST test strip Testing for up to tid 100 each 98  . clonazePAM (KLONOPIN) 1 MG tablet TAKE 1 TABLET BY MOUTH TWICE DAILY FOR ANXIETY 60 tablet 3  . Diclofenac Sodium (PENNSAID) 2 % SOLN Place 1 application onto the skin 2 (two) times daily. 1 Bottle 3  . dicyclomine (BENTYL) 10 MG capsule TAKE 1 CAPSULE BY MOUTH TWICE DAILY AS NEEDED FOR  CRAMPING  AND  ABDOMINAL  PAIN 180 capsule 1  . docusate sodium (COLACE) 100 MG capsule Take 100 mg by mouth at bedtime.    Eduard Roux (AIMOVIG) 140 MG/ML SOAJ Inject 140 mg into the skin every 30 (thirty) days. 1 pen 11  . Insulin Pen Needle 32G X  6 MM MISC Use daily as directed 100 each 1  . Lancet Devices (ADJUSTABLE LANCING DEVICE) MISC Up to tid testing  98  . pantoprazole (PROTONIX) 20 MG tablet TAKE 1 TABLET BY MOUTH TWICE DAILY 60 tablet 3  . promethazine (PHENERGAN) 25 MG tablet Take 1 tablet (25 mg total) by mouth every 6 (six) hours as needed for nausea or vomiting. 30 tablet 1  . rizatriptan (MAXALT-MLT) 10 MG disintegrating tablet Take 1 tablet (10 mg total) by mouth as needed for migraine. May repeat in 2 hours if needed. Do not take more than 3 tablets in a week. 10 tablet 3  . rosuvastatin (CRESTOR) 20 MG tablet Take 1 tablet (20 mg total) by mouth daily. 90 tablet 1  . thiamine 100 MG tablet Take 1 tablet (100 mg total) by mouth daily. 90 tablet 1  . traZODone (DESYREL) 150 MG tablet Take 1 tablet (150 mg total) by mouth at bedtime. 90 tablet 3  . venlafaxine (EFFEXOR) 37.5 MG tablet Take 1 tablet (37.5 mg total) by mouth 2 (two) times daily. 60 tablet 5  . Ertugliflozin L-PyroglutamicAc (STEGLATRO) 5 MG TABS Take 1 tablet by mouth daily. 90 tablet 1  . insulin detemir (LEVEMIR) 100 UNIT/ML injection Inject 0.1 mLs (10 Units total) into the skin  at bedtime. 10 mL 3  . metFORMIN (GLUCOPHAGE XR) 500 MG 24 hr tablet Take 1 tablet (500 mg total) by mouth 2 (two) times daily. 60 tablet 0   No facility-administered medications prior to visit.     ROS Review of Systems  Constitutional: Negative.  Negative for appetite change, chills, diaphoresis, fatigue and fever.  HENT: Negative.   Eyes: Negative for visual disturbance.  Respiratory: Positive for shortness of breath. Negative for cough, choking, chest tightness and wheezing.   Cardiovascular: Positive for chest pain. Negative for palpitations and leg swelling.  Gastrointestinal: Negative for abdominal pain, constipation, diarrhea, nausea and vomiting.  Endocrine: Negative.  Negative for polydipsia, polyphagia and polyuria.  Genitourinary: Negative.  Negative for decreased  urine volume, difficulty urinating, dysuria, frequency and urgency.  Musculoskeletal: Negative.  Negative for arthralgias, back pain, myalgias and neck pain.  Skin: Negative for color change, pallor and rash.  Neurological: Negative.  Negative for dizziness, weakness, light-headedness and headaches.  Hematological: Negative for adenopathy. Does not bruise/bleed easily.  Psychiatric/Behavioral: Negative.     Objective:  BP 130/80 (BP Location: Left Arm, Patient Position: Sitting, Cuff Size: Normal)   Pulse 78   Temp 98.4 F (36.9 C) (Oral)   Resp 16   Ht 5\' 2"  (1.575 m)   Wt 177 lb 8 oz (80.5 kg)   LMP 02/12/2014   SpO2 96%   BMI 32.47 kg/m   BP Readings from Last 3 Encounters:  06/16/18 130/80  05/15/18 (!) 142/83  05/15/18 116/68    Wt Readings from Last 3 Encounters:  06/16/18 177 lb 8 oz (80.5 kg)  05/15/18 170 lb (77.1 kg)  05/14/18 170 lb (77.1 kg)    Physical Exam  Constitutional: She is oriented to person, place, and time. No distress.  HENT:  Mouth/Throat: Oropharynx is clear and moist. No oropharyngeal exudate.  Eyes: Conjunctivae are normal. No scleral icterus.  Neck: Normal range of motion. Neck supple. No JVD present. No thyromegaly present.  Cardiovascular: Normal rate, regular rhythm and normal heart sounds. Exam reveals no friction rub.  No murmur heard. EKG ----  Sinus  Rhythm  WITHIN NORMAL LIMITS   Pulmonary/Chest: Effort normal and breath sounds normal. No respiratory distress. She has no wheezes. She has no rhonchi. She has no rales.  Abdominal: Soft. Normal appearance and bowel sounds are normal. She exhibits no mass. There is no hepatosplenomegaly. There is no tenderness. No hernia.  Musculoskeletal: Normal range of motion. She exhibits no edema, tenderness or deformity.  Neurological: She is alert and oriented to person, place, and time.  Skin: Skin is warm and dry. She is not diaphoretic. No pallor.  Vitals reviewed.   Lab Results    Component Value Date   WBC 10.1 05/14/2018   HGB 14.6 05/14/2018   HCT 43.0 05/14/2018   PLT 378 05/14/2018   GLUCOSE 171 (H) 05/14/2018   CHOL 257 (H) 12/30/2017   TRIG 146.0 12/30/2017   HDL 44.80 12/30/2017   LDLDIRECT 152.1 01/19/2013   LDLCALC 183 (H) 12/30/2017   ALT 24 05/14/2018   AST 21 05/14/2018   NA 141 05/14/2018   K 3.6 05/14/2018   CL 101 05/14/2018   CREATININE 0.60 05/14/2018   BUN 4 (L) 05/14/2018   CO2 27 05/14/2018   TSH 1.40 12/30/2017   INR 0.91 05/14/2018   HGBA1C 7.8 (A) 06/16/2018   MICROALBUR 0.9 12/30/2017    Dg Shoulder Left  Result Date: 05/15/2018 CLINICAL DATA:  Left shoulder pain and  limited range of motion for 3 months. No known injury. EXAM: LEFT SHOULDER - 2+ VIEW COMPARISON:  Plain films left shoulder 06/30/2015. FINDINGS: There is no evidence of fracture or dislocation. There is no evidence of arthropathy or other focal bone abnormality. Soft tissues are unremarkable. IMPRESSION: Negative exam. Electronically Signed   By: Inge Rise M.D.   On: 05/15/2018 16:06    Assessment & Plan:   Donalda was seen today for diabetes.  Diagnoses and all orders for this visit:  Uncontrolled type 2 diabetes mellitus with hyperglycemia ([place])- Her A1c is up to 7.8%.  Her blood sugars are not adequately well controlled.  I have asked her to increase the dose of metformin, basal insulin, and the SGLT2 inhibitor. -     POCT glycosylated hemoglobin (Hb A1C) -     POCT Glucose (Device for Home Use) -     Ertugliflozin-metFORMIN HCl (SEGLUROMET) 7.04-999 MG TABS; Take 1 tablet by mouth 2 (two) times daily. -     Ambulatory referral to Ophthalmology -     insulin detemir (LEVEMIR) 100 UNIT/ML injection; Inject 0.3 mLs (30 Units total) into the skin daily.  Chest pain, unspecified type- Her chest pain does not sound like angina.  Her EKG is normal.  She had a normal cardiac cath about 2 years ago so I am not concerned about ischemia.  She will let me know if  the chest pain returns. -     EKG 12-Lead   I have discontinued Shavonna B. Dowlen's Ertugliflozin L-PyroglutamicAc and metFORMIN. I have also changed her insulin detemir. Additionally, I am having her start on Ertugliflozin-metFORMIN HCl. Lastly, I am having her maintain her docusate sodium, albuterol, rizatriptan, traZODone, Adjustable Lancing Device, AQUALANCE LANCETS 30G, Aclidinium Bromide, rosuvastatin, aspirin EC, thiamine, promethazine, venlafaxine, BAYER CONTOUR TEST, dicyclomine, Insulin Pen Needle, amitriptyline, Diclofenac Sodium, clonazePAM, Erenumab-aooe, and pantoprazole.  Meds ordered this encounter  Medications  . Ertugliflozin-metFORMIN HCl (SEGLUROMET) 7.04-999 MG TABS    Sig: Take 1 tablet by mouth 2 (two) times daily.    Dispense:  180 tablet    Refill:  1  . insulin detemir (LEVEMIR) 100 UNIT/ML injection    Sig: Inject 0.3 mLs (30 Units total) into the skin daily.    Dispense:  10 mL    Refill:  3     Follow-up: No follow-ups on file.  Scarlette Calico, MD

## 2018-06-16 NOTE — Telephone Encounter (Signed)
I called [facility] [phone] and requested a refill on the patients Botox medication.

## 2018-06-17 NOTE — Telephone Encounter (Signed)
I called to check status of the patients Botox medication. I spoke with Mickel Baas, who stated it was still pending insurance verification.

## 2018-06-18 NOTE — Telephone Encounter (Signed)
I called [facility] spoke with [facility] and she stated that the medication, it is still pending insurance verification.

## 2018-06-23 NOTE — Telephone Encounter (Signed)
I called and checked status of the patients medication, we scheduled it for delivery for 06/25/18. DW

## 2018-06-24 ENCOUNTER — Ambulatory Visit: Payer: BLUE CROSS/BLUE SHIELD | Admitting: Family

## 2018-06-24 ENCOUNTER — Encounter: Payer: Self-pay | Admitting: Family

## 2018-06-24 VITALS — BP 130/72 | HR 80 | Temp 98.7°F | Ht 62.0 in | Wt 176.1 lb

## 2018-06-24 DIAGNOSIS — G43119 Migraine with aura, intractable, without status migrainosus: Secondary | ICD-10-CM

## 2018-06-24 MED ORDER — ONDANSETRON HCL 4 MG/2ML IJ SOLN
4.0000 mg | Freq: Three times a day (TID) | INTRAMUSCULAR | Status: DC | PRN
  Administered 2018-06-24: 4 mg via INTRAMUSCULAR

## 2018-06-24 MED ORDER — KETOROLAC TROMETHAMINE 30 MG/ML IJ SOLN: 30.0000 mg | Freq: Once | INTRAMUSCULAR | Status: DC

## 2018-06-24 MED ORDER — ONDANSETRON 4 MG PO TBDP: 4.0000 mg | ORAL_TABLET | Freq: Once | ORAL | Status: DC

## 2018-06-24 MED ORDER — KETOROLAC TROMETHAMINE 30 MG/ML IJ SOLN
30.0000 mg | Freq: Once | INTRAMUSCULAR | Status: AC
  Administered 2018-06-24: 30 mg via INTRAMUSCULAR

## 2018-06-24 NOTE — Progress Notes (Signed)
Ashley [name] is a 52 y.o. female with the following history as recorded in Epic[employer]:  Patient Active Problem List   Diagnosis Date Noted  . Left shoulder pain 03/15/2018  . Oral lesion 02/17/2018  . Thiamine deficiency 01/05/2018  . Claudication of both lower extremities ([place]) 12/30/2017  . Deficiency anemia 12/30/2017  . Constipation 09/12/2017  . Hypokalemia 05/23/2017  . Moderate episode of recurrent major depressive disorder ([place]) 05/16/2017  . Bilateral leg numbness 04/25/2017  . Diabetes mellitus with complication ([place])   . 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 Paresthesia 03/04/2017  . Tobacco abuse 01/08/2017  . Chronic bronchitis ([place]) 10/20/2015  . Dyslipidemia, goal LDL below 70 04/12/2015  . Migraine with aura, intractable 08/12/2014  . Routine general medical examination at a health care facility 05/24/2011  . Anxiety state 09/30/2009  . INSOMNIA, CHRONIC 09/30/2009  . ADJUSTMENT DISORDER WITH MIXED FEATURES 09/30/2009    Current Outpatient Medications  Medication Sig Dispense Refill  . Aclidinium Bromide (TUDORZA PRESSAIR) 400 MCG/ACT AEPB Inhale 1 puff into the lungs 2 (two) times daily. 1 each 11  . albuterol (PROVENTIL HFA;VENTOLIN HFA) 108 (90 Base) MCG/ACT inhaler Inhale 2 puffs into the lungs every 6 (six) hours as needed for wheezing or shortness of breath.    Marland Kitchen amitriptyline (ELAVIL) 50 MG tablet Take 1.5 tablets (75 mg total) by mouth at bedtime. 135 tablet 1  . AQUALANCE LANCETS 30G MISC   98  . aspirin EC 81 MG tablet Take 1 tablet (81 mg total) by mouth daily. 90 tablet 1  . BAYER CONTOUR TEST test strip Testing for up to tid 100 each 98  . clonazePAM (KLONOPIN) 1 MG tablet TAKE 1 TABLET BY MOUTH TWICE DAILY FOR ANXIETY 60 tablet 3  . Diclofenac Sodium (PENNSAID) 2 % SOLN Place 1 application onto the skin 2 (two) times daily. 1 Bottle 3  . dicyclomine (BENTYL) 10 MG capsule TAKE 1 CAPSULE BY MOUTH TWICE DAILY AS NEEDED FOR  CRAMPING  AND  ABDOMINAL  PAIN 180  capsule 1  . docusate sodium (COLACE) 100 MG capsule Take 100 mg by mouth at bedtime.    Eduard Roux (AIMOVIG) 140 MG/ML SOAJ Inject 140 mg into the skin every 30 (thirty) days. 1 pen 11  . Ertugliflozin-metFORMIN HCl (SEGLUROMET) 7.04-999 MG TABS Take 1 tablet by mouth 2 (two) times daily. 180 tablet 1  . insulin detemir (LEVEMIR) 100 UNIT/ML injection Inject 0.3 mLs (30 Units total) into the skin daily. 10 mL 3  . Insulin Pen Needle 32G X 6 MM MISC Use daily as directed 100 each 1  . Lancet Devices (ADJUSTABLE LANCING DEVICE) MISC Up to tid testing  98  . pantoprazole (PROTONIX) 20 MG tablet TAKE 1 TABLET BY MOUTH TWICE DAILY 60 tablet 3  . promethazine (PHENERGAN) 25 MG tablet Take 1 tablet (25 mg total) by mouth every 6 (six) hours as needed for nausea or vomiting. 30 tablet 1  . rizatriptan (MAXALT-MLT) 10 MG disintegrating tablet Take 1 tablet (10 mg total) by mouth as needed for migraine. May repeat in 2 hours if needed. Do not take more than 3 tablets in a week. 10 tablet 3  . rosuvastatin (CRESTOR) 20 MG tablet Take 1 tablet (20 mg total) by mouth daily. 90 tablet 1  . thiamine 100 MG tablet Take 1 tablet (100 mg total) by mouth daily. 90 tablet 1  . traZODone (DESYREL) 150 MG tablet Take 1 tablet (150 mg total) by mouth at bedtime. 90 tablet  3  . venlafaxine (EFFEXOR) 37.5 MG tablet Take 1 tablet (37.5 mg total) by mouth 2 (two) times daily. 60 tablet 5   Current Facility-Administered Medications  Medication Dose Route Frequency Provider Last Rate Last Dose  . ondansetron (ZOFRAN) injection 4 mg  4 mg Intramuscular Q8H PRN Marrian Salvage, FNP   4 mg at 06/24/18 7124    Allergies: Penicillins and Topamax [topiramate]  Past Medical History:  Diagnosis Date  . Anxiety   . Bronchitis    uses inhaler if needed for bronchitis, lasted used -2014  . Cataract    bilateral  . Chronic headaches    migraines - in past, uses Phenergan for nausea   . Deficiency anemia 12/30/2017   . Diabetes mellitus without complication ([place])    taken off Metformin since 04/2014, HgbA1C - normal, will follow up with PCP- Dr. Deborra Medina, 07/2014  . GERD (gastroesophageal reflux disease)   . H/O exercise stress test 2011   done at [facility]- told that it was WNL, done due to pt. having panic attacks   . History of blood transfusion 1966-10-13   at birth in [place], [place], unsure number of units  . Poor dentition    very poor oral health   . SVD (spontaneous vaginal delivery)    x 3  . Tobacco abuse     Past Surgical History:  Procedure Laterality Date  . ABDOMINAL HYSTERECTOMY    . APPENDECTOMY  1988  . CARDIAC CATHETERIZATION N/A 08/17/2016   Procedure: Left Heart Cath and Coronary Angiography;  Surgeon: Burnell Blanks, MD;  Location: [facility];  Service: Cardiovascular;  Laterality: N/A;  . CHOLECYSTECTOMY N/A 06/07/2014   Procedure: LAPAROSCOPIC CHOLECYSTECTOMY;  Surgeon: Ralene Ok, MD;  Location: [facility];  Service: General;  Laterality: N/A;  . CHOLECYSTECTOMY  June 07 2014  . COLONOSCOPY  02/16/2014   normal   . ESOPHAGOGASTRODUODENOSCOPY  12/28/2013  . KNEE ARTHROSCOPY  1995   left  . LAPAROSCOPIC ASSISTED VAGINAL HYSTERECTOMY N/A 04/15/2014   Procedure: LAPAROSCOPIC ASSISTED VAGINAL HYSTERECTOMY;  Surgeon: Marylynn Pearson, MD;  Location: [facility];  Service: Gynecology;  Laterality: N/A;  . LAPAROSCOPIC BILATERAL SALPINGO OOPHERECTOMY Bilateral 04/15/2014   Procedure: LAPAROSCOPIC BILATERAL SALPINGO OOPHORECTOMY;  Surgeon: Marylynn Pearson, MD;  Location: [facility];  Service: Gynecology;  Laterality: Bilateral;  . TUBAL LIGATION      Family History  Problem Relation Age of Onset  . Diabetes Mother   . Hyperlipidemia Mother   . Hypertension Mother   . Anxiety disorder Mother   . Depression Mother   . Drug abuse Mother   . Prostate cancer Father   . Alcohol abuse Father   . Lung cancer Maternal Grandfather        smoked  . Emphysema Maternal Grandfather         smoked  . COPD Maternal Grandmother        never smoked   . Dementia Maternal Grandmother   . Thyroid cancer Paternal Aunt   . Colon cancer Neg Hx   . Rectal cancer Neg Hx   . Stomach cancer Neg Hx   . Migraines Neg Hx     Social History   Tobacco Use  . Smoking status: Current Every Day Smoker    Packs/day: 2.00    Years: 35.00    Pack years: 70.00    Types: Cigarettes  . Smokeless tobacco: Never Used  . Tobacco comment: 08-17-17- pt reports smokes "almost 2 packs a day"  Substance Use Topics  . Alcohol use: No    Alcohol/week: 0.0 oz    Subjective:  Patient presents with concerns for migraine headache which started early this morning; has chronic history of migraines- under the care of neurology; due for Botox injection in the next few days; requesting Toradol injection today; has done well on this medication in the past; Took her Maxalt this morning with no relief; used her Zofran and still feeling nauseated; has not been able to start her Aimovig yet that her neurologist has prescribed;   Objective:  Vitals:   06/24/18 1532  BP: 130/72  Pulse: 80  Temp: 98.7 F (37.1 C)  TempSrc: Oral  SpO2: 96%  Weight: 176 lb 1.3 oz (79.9 kg)  Height: 5\' 2"  (1.575 m)    General: Well developed, well nourished, in no acute distress; prefers to have the room darkened today;  Skin : Warm and dry.  Head: Normocephalic and atraumatic  Lungs: Respirations unlabored;   Neurologic: Alert and oriented; speech intact; face symmetrical; moves all extremities well; CNII-XII intact without focal deficit  Assessment:  1. Intractable migraine with aura without status migrainosus     Plan:  Toradol IM 30 mg and Zofran 4 mg given in office with no complications; she will follow-up with her neurologist is headache is still present in 24 hours.   No follow-ups on file.  No orders of the defined types were placed in this encounter.   Requested Prescriptions    No prescriptions requested or  ordered in this encounter

## 2018-06-30 ENCOUNTER — Other Ambulatory Visit: Payer: Self-pay | Admitting: Internal Medicine

## 2018-06-30 DIAGNOSIS — I739 Peripheral vascular disease, unspecified: Secondary | ICD-10-CM

## 2018-06-30 DIAGNOSIS — E118 Type 2 diabetes mellitus with unspecified complications: Secondary | ICD-10-CM

## 2018-06-30 DIAGNOSIS — E785 Hyperlipidemia, unspecified: Secondary | ICD-10-CM

## 2018-07-02 ENCOUNTER — Telehealth: Payer: Self-pay | Admitting: Neurology

## 2018-07-02 ENCOUNTER — Ambulatory Visit: Payer: BLUE CROSS/BLUE SHIELD | Admitting: Neurology

## 2018-07-02 ENCOUNTER — Encounter: Payer: Self-pay | Admitting: Neurology

## 2018-07-02 DIAGNOSIS — G43711 Chronic migraine without aura, intractable, with status migrainosus: Secondary | ICD-10-CM | POA: Diagnosis not present

## 2018-07-02 MED ORDER — ERENUMAB-AOOE 140 MG/ML ~~LOC~~ SOAJ: 140.0000 mg | SUBCUTANEOUS | 11 refills | Status: DC

## 2018-07-02 NOTE — Telephone Encounter (Signed)
Done

## 2018-07-02 NOTE — Progress Notes (Signed)
Interval history : Patient reports significant improvement of greater than 50% migraine frequency and migraine severity.  Consent Form Botulism Toxin Injection For Chronic Migraine  Reviewed orally with patient, additionally signature is on file:  Botulism toxin has been approved by the Federal drug administration for treatment of chronic migraine. Botulism toxin does not cure chronic migraine and it may not be effective in some patients.  The administration of botulism toxin is accomplished by injecting a small amount of toxin into the muscles of the neck and head. Dosage must be titrated for each individual. Any benefits resulting from botulism toxin tend to wear off after 3 months with a repeat injection required if benefit is to be maintained. Injections are usually done every 3-4 months with maximum effect peak achieved by about 2 or 3 weeks. Botulism toxin is expensive and you should be sure of what costs you will incur resulting from the injection.  The side effects of botulism toxin use for chronic migraine may include:   -Transient, and usually mild, facial weakness with facial injections  -Transient, and usually mild, head or neck weakness with head/neck injections  -Reduction or loss of forehead facial animation due to forehead muscle weakness  -Eyelid drooping  -Dry eye  -Pain at the site of injection or bruising at the site of injection  -Double vision  -Potential unknown long term risks  Contraindications: You should not have Botox if you are pregnant, nursing, allergic to albumin, have an infection, skin condition, or muscle weakness at the site of the injection, or have myasthenia gravis, Lambert-Eaton syndrome, or ALS.  It is also possible that as with any injection, there may be an allergic reaction or no effect from the medication. Reduced effectiveness after repeated injections is sometimes seen and rarely infection at the injection site may occur. All care will be  taken to prevent these side effects. If therapy is given over a long time, atrophy and wasting in the muscle injected may occur. Occasionally the patient's become refractory to treatment because they develop antibodies to the toxin. In this event, therapy needs to be modified.  I have read the above information and consent to the administration of botulism toxin.    BOTOX PROCEDURE NOTE FOR MIGRAINE HEADACHE    Contraindications and precautions discussed with patient(above). Aseptic procedure was observed and patient tolerated procedure. Procedure performed by Dr. Georgia Dom  The condition has existed for more than 6 months, and pt does not have a diagnosis of ALS, Myasthenia Gravis or Lambert-Eaton Syndrome.  Risks and benefits of injections discussed and pt agrees to proceed with the procedure.  Written consent obtained  These injections are medically necessary. Pt  receives good benefits from these injections. These injections do not cause sedations or hallucinations which the oral therapies may cause.  Indication/Diagnosis: chronic migraine BOTOX(J0585) injection was performed according to protocol by [employer]. 200 units of BOTOX was dissolved into 4 cc NS.   NDC: [address]-10   Description of procedure:  The patient was placed in a sitting position. The standard protocol was used for Botox as follows, with 5 units of Botox injected at each site:   -Procerus muscle, midline injection  -Corrugator muscle, bilateral injection  -Frontalis muscle, bilateral injection, with 2 sites each side, medial injection was performed in the upper one third of the frontalis muscle, in the region vertical from the medial inferior edge of the superior orbital rim. The lateral injection was again in the upper one third of the  forehead vertically above the lateral limbus of the cornea, 1.5 cm lateral to the medial injection site.  -Temporalis muscle injection, 4 sites, bilaterally. The first  injection was 3 cm above the tragus of the ear, second injection site was 1.5 cm to 3 cm up from the first injection site in line with the tragus of the ear. The third injection site was 1.5-3 cm forward between the first 2 injection sites. The fourth injection site was 1.5 cm posterior to the second injection site.  -Occipitalis muscle injection, 3 sites, bilaterally. The first injection was done one half way between the occipital protuberance and the tip of the mastoid process behind the ear. The second injection site was done lateral and superior to the first, 1 fingerbreadth from the first injection. The third injection site was 1 fingerbreadth superiorly and medially from the first injection site.  -Cervical paraspinal muscle injection, 2 sites, bilateral knee first injection site was 1 cm from the midline of the cervical spine, 3 cm inferior to the lower border of the occipital protuberance. The second injection site was 1.5 cm superiorly and laterally to the first injection site.  -Trapezius muscle injection was performed at 3 sites, bilaterally. The first injection site was in the upper trapezius muscle halfway between the inflection point of the neck, and the acromion. The second injection site was one half way between the acromion and the first injection site. The third injection was done between the first injection site and the inflection point of the neck.   Will return for repeat injection in 3 months.   A 200 unit sof Botox was used, 155 units were injected, the rest of the Botox was wasted. The patient tolerated the procedure well, there were no complications of the above procedure.

## 2018-07-02 NOTE — Telephone Encounter (Signed)
Can you put the patient in the 11:30 slot for Oct 14?? She wanted a morning appt and I was not able to schedule it in that slot.

## 2018-07-02 NOTE — Progress Notes (Signed)
Botox- 200 units x 1 vial Lot: [id number] Expiration: 02/22 NDC: 4715-9539-67  Bacteriostatic 0.9% Sodium Chloride- 36mL total Lot: [id number] Expiration: 04/24/2019 NDC: 5041-3643-83  Dx: J79.396 S/P Pt given Aimovig savings card in hard copy. Instructed that if she is not able to get this activated and start getting her Aimovig, we can switch her back to [name] (per Dr. Jaynee Eagles) and she can continue using the copay card with [name].

## 2018-07-03 NOTE — Telephone Encounter (Signed)
Noted, thank you

## 2018-07-14 ENCOUNTER — Other Ambulatory Visit: Payer: Self-pay | Admitting: Internal Medicine

## 2018-07-14 DIAGNOSIS — F411 Generalized anxiety disorder: Secondary | ICD-10-CM

## 2018-07-26 ENCOUNTER — Other Ambulatory Visit: Payer: Self-pay

## 2018-07-26 ENCOUNTER — Emergency Department (HOSPITAL_COMMUNITY): Payer: BLUE CROSS/BLUE SHIELD

## 2018-07-26 ENCOUNTER — Encounter (HOSPITAL_COMMUNITY): Payer: Self-pay

## 2018-07-26 ENCOUNTER — Emergency Department (HOSPITAL_COMMUNITY)
Admission: EM | Admit: 2018-07-26 | Discharge: 2018-07-26 | Disposition: A | Discharge status: Home / Self Care | Payer: BLUE CROSS/BLUE SHIELD | Attending: Emergency Medicine | Admitting: Emergency Medicine

## 2018-07-26 DIAGNOSIS — Z79899 Other long term (current) drug therapy: Secondary | ICD-10-CM | POA: Insufficient documentation

## 2018-07-26 DIAGNOSIS — M25532 Pain in left wrist: Secondary | ICD-10-CM | POA: Diagnosis not present

## 2018-07-26 DIAGNOSIS — Y999 Unspecified external cause status: Secondary | ICD-10-CM | POA: Insufficient documentation

## 2018-07-26 DIAGNOSIS — Y9301 Activity, walking, marching and hiking: Secondary | ICD-10-CM | POA: Insufficient documentation

## 2018-07-26 DIAGNOSIS — E119 Type 2 diabetes mellitus without complications: Secondary | ICD-10-CM | POA: Diagnosis not present

## 2018-07-26 DIAGNOSIS — Y929 Unspecified place or not applicable: Secondary | ICD-10-CM | POA: Diagnosis not present

## 2018-07-26 DIAGNOSIS — Z7982 Long term (current) use of aspirin: Secondary | ICD-10-CM | POA: Insufficient documentation

## 2018-07-26 DIAGNOSIS — W101XXA Fall (on)(from) sidewalk curb, initial encounter: Secondary | ICD-10-CM | POA: Diagnosis not present

## 2018-07-26 DIAGNOSIS — Z794 Long term (current) use of insulin: Secondary | ICD-10-CM | POA: Diagnosis not present

## 2018-07-26 DIAGNOSIS — W19XXXA Unspecified fall, initial encounter: Secondary | ICD-10-CM

## 2018-07-26 DIAGNOSIS — F17210 Nicotine dependence, cigarettes, uncomplicated: Secondary | ICD-10-CM | POA: Insufficient documentation

## 2018-07-26 DIAGNOSIS — S0081XA Abrasion of other part of head, initial encounter: Secondary | ICD-10-CM | POA: Diagnosis not present

## 2018-07-26 DIAGNOSIS — M25512 Pain in left shoulder: Secondary | ICD-10-CM

## 2018-07-26 MED ORDER — IBUPROFEN 800 MG PO TABS: 800.0000 mg | ORAL_TABLET | Freq: Once | ORAL | Status: DC

## 2018-07-26 NOTE — ED Triage Notes (Signed)
Patient complains of fall after tripping over curb today, no loc. Abrasions to left shoulder, foot, and abrasion to forehead. Glasses broken during fall. Alert and oriented, NAD

## 2018-07-26 NOTE — ED Provider Notes (Signed)
[facility] EMERGENCY DEPARTMENT Provider Note  CSN: [id number] Arrival date & time: 07/26/18  1458  History   Chief Complaint No chief complaint on file.   HPI Ashley [name] is a 52 y.o. female with a medical history of Type 2 DM, GERD and migraines who presented to the ED following a fall. Patient states that she tripped on a curb walking into a store and thinks her face or shoulder made contact first. Denies paresthesias, weakness, foot droop, dizziness/lighteheadedness or syncope prior to the fall. Currently endorses face, left shoulder and left wrist pain. Denies paresthesias, weakness, vision changes, otalgia, otorrhea, LOC or AMS/confusion. She is not on an anticoagulant.  Past Medical History:  Diagnosis Date  . Anxiety   . Bronchitis    uses inhaler if needed for bronchitis, lasted used -2014  . Cataract    bilateral  . Chronic headaches    migraines - in past, uses Phenergan for nausea   . Deficiency anemia 12/30/2017  . Diabetes mellitus without complication ([place])    taken off Metformin since 04/2014, HgbA1C - normal, will follow up with PCP- Dr. Deborra Medina, 07/2014  . GERD (gastroesophageal reflux disease)   . H/O exercise stress test 2011   done at [facility]- told that it was WNL, done due to pt. having panic attacks   . History of blood transfusion 02/09/1966   at birth in [place], [place], unsure number of units  . Poor dentition    very poor oral health   . SVD (spontaneous vaginal delivery)    x 3  . Tobacco abuse     Patient Active Problem List   Diagnosis Date Noted  . Chronic migraine without aura, with intractable migraine, so stated, with status migrainosus 07/02/2018  . Left shoulder pain 03/15/2018  . Oral lesion 02/17/2018  . Thiamine deficiency 01/05/2018  . Claudication of both lower extremities ([place]) 12/30/2017  . Deficiency anemia 12/30/2017  . Constipation 09/12/2017  . Hypokalemia 05/23/2017  . Moderate episode of recurrent major  depressive disorder ([place]) 05/16/2017  . Bilateral leg numbness 04/25/2017  . Diabetes mellitus with complication ([place])   . 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 Paresthesia 03/04/2017  . Tobacco abuse 01/08/2017  . Chronic bronchitis ([place]) 10/20/2015  . Dyslipidemia, goal LDL below 70 04/12/2015  . Migraine with aura, intractable 08/12/2014  . Routine general medical examination at a health care facility 05/24/2011  . Anxiety state 09/30/2009  . INSOMNIA, CHRONIC 09/30/2009  . ADJUSTMENT DISORDER WITH MIXED FEATURES 09/30/2009    Past Surgical History:  Procedure Laterality Date  . ABDOMINAL HYSTERECTOMY    . APPENDECTOMY  1988  . CARDIAC CATHETERIZATION N/A 08/17/2016   Procedure: Left Heart Cath and Coronary Angiography;  Surgeon: Burnell Blanks, MD;  Location: [facility];  Service: Cardiovascular;  Laterality: N/A;  . CHOLECYSTECTOMY N/A 06/07/2014   Procedure: LAPAROSCOPIC CHOLECYSTECTOMY;  Surgeon: Ralene Ok, MD;  Location: [facility];  Service: General;  Laterality: N/A;  . CHOLECYSTECTOMY  June 07 2014  . COLONOSCOPY  02/16/2014   normal   . ESOPHAGOGASTRODUODENOSCOPY  12/28/2013  . KNEE ARTHROSCOPY  1995   left  . LAPAROSCOPIC ASSISTED VAGINAL HYSTERECTOMY N/A 04/15/2014   Procedure: LAPAROSCOPIC ASSISTED VAGINAL HYSTERECTOMY;  Surgeon: Marylynn Pearson, MD;  Location: [facility];  Service: Gynecology;  Laterality: N/A;  . LAPAROSCOPIC BILATERAL SALPINGO OOPHERECTOMY Bilateral 04/15/2014   Procedure: LAPAROSCOPIC BILATERAL SALPINGO OOPHORECTOMY;  Surgeon: Marylynn Pearson, MD;  Location: [facility];  Service: Gynecology;  Laterality:  Bilateral;  . TUBAL LIGATION       OB History   None      Home Medications    Prior to Admission medications   Medication Sig Start Date End Date Taking? Authorizing Provider  Aclidinium Bromide (TUDORZA PRESSAIR) 400 MCG/ACT AEPB Inhale 1 puff into the lungs 2 (two) times daily. 12/30/17   Janith Lima, MD  albuterol (PROVENTIL  HFA;VENTOLIN HFA) 108 (90 Base) MCG/ACT inhaler Inhale 2 puffs into the lungs every 6 (six) hours as needed for wheezing or shortness of breath.    [provider]  amitriptyline (ELAVIL) 50 MG tablet Take 1.5 tablets (75 mg total) by mouth at bedtime. 05/12/18   Marrian Salvage, [place]  AQUALANCE LANCETS 30G [place]  11/27/17   [provider]  aspirin EC 81 MG tablet Take 1 tablet (81 mg total) by mouth daily. 01/01/18   Janith Lima, MD  BAYER CONTOUR TEST test strip Testing for up to tid 03/12/18   Janith Lima, MD  clonazePAM (KLONOPIN) 1 MG tablet TAKE 1 TABLET BY MOUTH TWICE DAILY FOR ANXIETY 05/26/18   Janith Lima, MD  Diclofenac Sodium (PENNSAID) 2 % SOLN Place 1 application onto the skin 2 (two) times daily. 05/15/18   Rosemarie Ax, MD  dicyclomine (BENTYL) 10 MG capsule TAKE 1 CAPSULE BY MOUTH TWICE DAILY AS NEEDED FOR  CRAMPING  AND  ABDOMINAL  PAIN 03/19/18   Janith Lima, MD  docusate sodium (COLACE) 100 MG capsule Take 100 mg by mouth at bedtime.    [provider]  Erenumab-aooe (AIMOVIG) 140 MG/ML SOAJ Inject 140 mg into the skin every 30 (thirty) days. 07/02/18   Melvenia Beam, MD  Ertugliflozin-metFORMIN HCl (SEGLUROMET) 7.04-999 MG TABS Take 1 tablet by mouth 2 (two) times daily. 06/16/18   Janith Lima, MD  insulin detemir (LEVEMIR) 100 UNIT/ML injection Inject 0.3 mLs (30 Units total) into the skin daily. 06/16/18   Janith Lima, MD  Insulin Pen Needle 32G X 6 MM MISC Use daily as directed 05/12/18   Marrian Salvage, FNP  Lancet Devices (ADJUSTABLE LANCING DEVICE) MISC Up to tid testing 09/19/17   [provider]  pantoprazole (PROTONIX) 20 MG tablet TAKE 1 TABLET BY MOUTH TWICE DAILY 06/04/18   Janith Lima, MD  promethazine (PHENERGAN) 25 MG tablet Take 1 tablet (25 mg total) by mouth every 6 (six) hours as needed for nausea or vomiting. 01/06/18   Janith Lima, MD  rizatriptan (MAXALT-MLT) 10 MG disintegrating  tablet Take 1 tablet (10 mg total) by mouth as needed for migraine. May repeat in 2 hours if needed. Do not take more than 3 tablets in a week. 07/17/17   Lucille Passy, MD  rosuvastatin (CRESTOR) 20 MG tablet TAKE 1 TABLET BY MOUTH ONCE DAILY 06/30/18   Janith Lima, MD  thiamine 100 MG tablet Take 1 tablet (100 mg total) by mouth daily. 01/05/18   Janith Lima, MD  traZODone (DESYREL) 150 MG tablet Take 1 tablet (150 mg total) by mouth at bedtime. 08/12/17   Lucille Passy, MD  venlafaxine [facility]) 37.5 MG tablet TAKE 1 TABLET BY MOUTH TWICE DAILY 07/14/18   Janith Lima, MD    Family History Family History  Problem Relation Age of Onset  . Diabetes Mother   . Hyperlipidemia Mother   . Hypertension Mother   . Anxiety disorder Mother   . Depression Mother   .  Drug abuse Mother   . Prostate cancer Father   . Alcohol abuse Father   . Lung cancer Maternal Grandfather        smoked  . Emphysema Maternal Grandfather        smoked  . COPD Maternal Grandmother        never smoked   . Dementia Maternal Grandmother   . Thyroid cancer Paternal Aunt   . Colon cancer Neg Hx   . Rectal cancer Neg Hx   . Stomach cancer Neg Hx   . Migraines Neg Hx     Social History Social History   Tobacco Use  . Smoking status: Current Every Day Smoker    Packs/day: 2.00    Years: 35.00    Pack years: 70.00    Types: Cigarettes  . Smokeless tobacco: Never Used  . Tobacco comment: 08-17-17- pt reports smokes "almost 2 packs a day"  Substance Use Topics  . Alcohol use: No    Alcohol/week: 0.0 oz  . Drug use: No     Allergies   Penicillins and Topamax [topiramate]   Review of Systems Review of Systems  Constitutional: Negative.   HENT: Negative for ear discharge and ear pain.        Face pain  Eyes: Negative for pain and visual disturbance.  Musculoskeletal: Positive for arthralgias.  Skin: Positive for wound.  Neurological: Negative for dizziness, syncope, speech difficulty,  weakness, light-headedness, numbness and headaches.  Hematological: Does not bruise/bleed easily.  Psychiatric/Behavioral: Negative for confusion and decreased concentration.     Physical Exam Updated Vital Signs BP 134/64 (BP Location: Right Arm)   Pulse 75   Temp 98.3 F (36.8 C) (Oral)   Resp 14   LMP 02/12/2014   SpO2 97%   Physical Exam  Constitutional: She is oriented to person, place, and time. Vital signs are normal. She appears well-developed and well-nourished. She is cooperative.  HENT:  Head:    Nose: No sinus tenderness. No epistaxis.  Mouth/Throat: Uvula is midline, oropharynx is clear and moist and mucous membranes are normal.  Abrasion on left forehead above eyebrow. Ecchymosis on bridge of nose. Tenderness along the left maxillary bone  Eyes: Pupils are equal, round, and reactive to light. Conjunctivae, EOM and lids are normal.  Musculoskeletal:       Left shoulder: She exhibits tenderness. She exhibits normal range of motion, no bony tenderness and no deformity.       Left elbow: Normal.       Left wrist: She exhibits tenderness. She exhibits normal range of motion, no bony tenderness, no swelling and no deformity.       Left upper arm: Normal.       Left forearm: Normal.       Left hand: Normal.  Full active and passive ROM in upper extremities bilaterally with 5/5 strength. Abrasion on left anterior deltoid muscle. No bony tenderness along left shoulder, wrist or hand.  Neurological: She is alert and oriented to person, place, and time. She has normal strength. No cranial nerve deficit or sensory deficit. She exhibits normal muscle tone. GCS eye subscore is 4. GCS verbal subscore is 5. GCS motor subscore is 6.  Reflex Scores:      Tricep reflexes are 2+ on the right side and 2+ on the left side.      Bicep reflexes are 2+ on the right side and 2+ on the left side.      Brachioradialis reflexes are 2+ on  the right side and 2+ on the left side. Skin: Skin is  warm. Capillary refill takes less than 2 seconds. Abrasion and bruising noted.  Nursing note and vitals reviewed.    ED Treatments / Results  Labs (all labs ordered are listed, but only abnormal results are displayed) Labs Reviewed - No data to display  EKG None  Radiology Ct Maxillofacial Wo Contrast  Result Date: 07/26/2018 CLINICAL DATA:  Fall, forehead abrasion EXAM: CT MAXILLOFACIAL WITHOUT CONTRAST TECHNIQUE: Multidetector CT imaging of the maxillofacial structures was performed. Multiplanar CT image reconstructions were also generated. COMPARISON:  Head CT dated 05/14/2018. FINDINGS: Osseous: No evidence of maxillofacial fracture. Mandible is intact. Bilateral mandibular condyles are well-seated in the TMJs. Orbits: Bilateral orbits, including the globes and retroconal soft tissues, are within normal limits. Sinuses: The visualized paranasal sinuses are essentially clear. The mastoid air cells are unopacified. Soft tissues: Mass mild soft tissue swelling overlying the left frontal bone (series 3/image 68). Limited intracranial: Within normal limits. IMPRESSION: Mild soft tissue swelling overlying the left frontal bone. No evidence of maxillofacial fracture. Electronically Signed   By: Julian Hy M.D.   On: 07/26/2018 18:34    Procedures Procedures (including critical care time)  Medications Ordered in ED Medications  ibuprofen (ADVIL,MOTRIN) tablet 800 mg (has no administration in time range)     Initial Impression / Assessment and Plan / ED Course  Triage vital signs and the nursing notes have been reviewed.  Pertinent labs & imaging results that were available during care of the patient were reviewed and considered in medical decision making (see chart for details).  Patient presents following a mechanical fall where she landed face first. There is bruising and tenderness over the maxillary bones which is concerning for fracture. Patient did not have LOC or  AMS/confusion after the accident. No focal neuro deficits which is reassuring that there is not an acute intracranial process that requires imaging today. Remaining physical exam is reassuring. Patient has full active and passive ROM of affected shoulder and wrist with no bony tenderness.  Clinical Course as of Jul 27 1839  Sat Jul 26, 2018  7948 No fractures on CT scan. Only soft tissue swelling over frontal bone appreciated which is consistent with abrasion and bruised area on exam.   [GM]    Clinical Course User Index [GM] Mortis, Jonelle Sports, PA-C   Final Clinical Impressions(s) / ED Diagnoses  1. Fall. Education on OTC and supportive treatment for pain relief and swelling.  Dispo: Home. After thorough clinical evaluation, this patient is determined to be medically stable and can be safely discharged with the previously mentioned treatment and/or outpatient follow-up/referral(s). At this time, there are no other apparent medical conditions that require further screening, evaluation or treatment.  Final diagnoses:  Fall, initial encounter  Acute pain of left shoulder  Left wrist pain    ED Discharge Orders    None        Junita Push 07/26/18 1842    Dorie Rank, MD 07/27/18 [phone]

## 2018-07-26 NOTE — Discharge Instructions (Addendum)
CT scan of your face showed that there were no broken bones. This is great news!  You may use Tylenol and/or Ibuprofen/Naproxen for pain relief and swelling. You may also use warm or cold compresses for additional relief. Ice is recommended over heat for your face.  Thank you for allowing me to take care of you today! Enjoy and have a safe weekend.

## 2018-07-28 ENCOUNTER — Encounter: Payer: Self-pay | Admitting: Family

## 2018-07-28 ENCOUNTER — Ambulatory Visit (INDEPENDENT_AMBULATORY_CARE_PROVIDER_SITE_OTHER)
Admission: RE | Admit: 2018-07-28 | Discharge: 2018-07-28 | Disposition: A | Discharge status: Home / Self Care | Payer: BLUE CROSS/BLUE SHIELD | Source: Ambulatory Visit | Attending: Family | Admitting: Family

## 2018-07-28 ENCOUNTER — Other Ambulatory Visit: Payer: Self-pay | Admitting: Family

## 2018-07-28 ENCOUNTER — Ambulatory Visit: Payer: BLUE CROSS/BLUE SHIELD | Admitting: Family

## 2018-07-28 VITALS — BP 122/78 | HR 82 | Temp 97.7°F | Ht 62.0 in | Wt 179.0 lb

## 2018-07-28 DIAGNOSIS — M79642 Pain in left hand: Secondary | ICD-10-CM | POA: Diagnosis not present

## 2018-07-28 MED ORDER — MELOXICAM 15 MG PO TABS: 15.0000 mg | ORAL_TABLET | Freq: Every day | ORAL | 0 refills | Status: DC

## 2018-07-28 NOTE — Patient Instructions (Signed)
Start OTC Vitamin D3 2000 IU daily;

## 2018-07-28 NOTE — Progress Notes (Signed)
Ashley [name] is a 52 y.o. female with the following history as recorded in EpicCare:  Patient Active Problem List   Diagnosis Date Noted  . Chronic migraine without aura, with intractable migraine, so stated, with status migrainosus 07/02/2018  . Left shoulder pain 03/15/2018  . Oral lesion 02/17/2018  . Thiamine deficiency 01/05/2018  . Claudication of both lower extremities ([place]) 12/30/2017  . Deficiency anemia 12/30/2017  . Constipation 09/12/2017  . Hypokalemia 05/23/2017  . Moderate episode of recurrent major depressive disorder ([place]) 05/16/2017  . Bilateral leg numbness 04/25/2017  . Diabetes mellitus with complication ([place])   . 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 Paresthesia 03/04/2017  . Tobacco abuse 01/08/2017  . Chronic bronchitis ([place]) 10/20/2015  . Dyslipidemia, goal LDL below 70 04/12/2015  . Migraine with aura, intractable 08/12/2014  . Routine general medical examination at a health care facility 05/24/2011  . Anxiety state 09/30/2009  . INSOMNIA, CHRONIC 09/30/2009  . ADJUSTMENT DISORDER WITH MIXED FEATURES 09/30/2009    Current Outpatient Medications  Medication Sig Dispense Refill  . Aclidinium Bromide (TUDORZA PRESSAIR) 400 MCG/ACT AEPB Inhale 1 puff into the lungs 2 (two) times daily. 1 each 11  . albuterol (PROVENTIL HFA;VENTOLIN HFA) 108 (90 Base) MCG/ACT inhaler Inhale 2 puffs into the lungs every 6 (six) hours as needed for wheezing or shortness of breath.    Marland Kitchen amitriptyline (ELAVIL) 50 MG tablet Take 1.5 tablets (75 mg total) by mouth at bedtime. 135 tablet 1  . AQUALANCE LANCETS 30G MISC   98  . aspirin EC 81 MG tablet Take 1 tablet (81 mg total) by mouth daily. 90 tablet 1  . BAYER CONTOUR TEST test strip Testing for up to tid 100 each 98  . clonazePAM (KLONOPIN) 1 MG tablet TAKE 1 TABLET BY MOUTH TWICE DAILY FOR ANXIETY 60 tablet 3  . Diclofenac Sodium (PENNSAID) 2 % SOLN Place 1 application onto the skin 2 (two) times daily. 1 Bottle 3  . dicyclomine  (BENTYL) 10 MG capsule TAKE 1 CAPSULE BY MOUTH TWICE DAILY AS NEEDED FOR  CRAMPING  AND  ABDOMINAL  PAIN 180 capsule 1  . docusate sodium (COLACE) 100 MG capsule Take 100 mg by mouth at bedtime.    Eduard Roux (AIMOVIG) 140 MG/ML SOAJ Inject 140 mg into the skin every 30 (thirty) days. 1 pen 11  . Ertugliflozin-metFORMIN HCl (SEGLUROMET) 7.04-999 MG TABS Take 1 tablet by mouth 2 (two) times daily. 180 tablet 1  . insulin detemir (LEVEMIR) 100 UNIT/ML injection Inject 0.3 mLs (30 Units total) into the skin daily. 10 mL 3  . Insulin Pen Needle 32G X 6 MM MISC Use daily as directed 100 each 1  . Lancet Devices (ADJUSTABLE LANCING DEVICE) MISC Up to tid testing  98  . pantoprazole (PROTONIX) 20 MG tablet TAKE 1 TABLET BY MOUTH TWICE DAILY 60 tablet 3  . promethazine (PHENERGAN) 25 MG tablet Take 1 tablet (25 mg total) by mouth every 6 (six) hours as needed for nausea or vomiting. 30 tablet 1  . rizatriptan (MAXALT-MLT) 10 MG disintegrating tablet Take 1 tablet (10 mg total) by mouth as needed for migraine. May repeat in 2 hours if needed. Do not take more than 3 tablets in a week. 10 tablet 3  . rosuvastatin (CRESTOR) 20 MG tablet TAKE 1 TABLET BY MOUTH ONCE DAILY 90 tablet 1  . thiamine 100 MG tablet Take 1 tablet (100 mg total) by mouth daily. 90 tablet 1  . traZODone (DESYREL) 150 MG  tablet Take 1 tablet (150 mg total) by mouth at bedtime. 90 tablet 3  . venlafaxine (EFFEXOR) 37.5 MG tablet TAKE 1 TABLET BY MOUTH TWICE DAILY 60 tablet 5  . meloxicam (MOBIC) 15 MG tablet Take 1 tablet (15 mg total) by mouth daily. 30 tablet 0   Current Facility-Administered Medications  Medication Dose Route Frequency Provider Last Rate Last Dose  . ondansetron (ZOFRAN) injection 4 mg  4 mg Intramuscular Q8H PRN Marrian Salvage, FNP   4 mg at 06/24/18 2951    Allergies: Penicillins and Topamax [topiramate]  Past Medical History:  Diagnosis Date  . Anxiety   . Bronchitis    uses inhaler if needed for  bronchitis, lasted used -2014  . Cataract    bilateral  . Chronic headaches    migraines - in past, uses Phenergan for nausea   . Deficiency anemia 12/30/2017  . Diabetes mellitus without complication ([place])    taken off Metformin since 04/2014, HgbA1C - normal, will follow up with PCP- Dr. Deborra Medina, 07/2014  . GERD (gastroesophageal reflux disease)   . H/O exercise stress test 2011   done at [facility]- told that it was WNL, done due to pt. having panic attacks   . History of blood transfusion 1966/02[birth year]07   at birth in [place], [place], unsure number of units  . Poor dentition    very poor oral health   . SVD (spontaneous vaginal delivery)    x 3  . Tobacco abuse     Past Surgical History:  Procedure Laterality Date  . ABDOMINAL HYSTERECTOMY    . APPENDECTOMY  1988  . CARDIAC CATHETERIZATION N/A 08/17/2016   Procedure: Left Heart Cath and Coronary Angiography;  Surgeon: Burnell Blanks, MD;  Location: [facility];  Service: Cardiovascular;  Laterality: N/A;  . CHOLECYSTECTOMY N/A 06/07/2014   Procedure: LAPAROSCOPIC CHOLECYSTECTOMY;  Surgeon: Ralene Ok, MD;  Location: [facility];  Service: General;  Laterality: N/A;  . CHOLECYSTECTOMY  June 07 2014  . COLONOSCOPY  02/16/2014   normal   . ESOPHAGOGASTRODUODENOSCOPY  12/28/2013  . KNEE ARTHROSCOPY  1995   left  . LAPAROSCOPIC ASSISTED VAGINAL HYSTERECTOMY N/A 04/15/2014   Procedure: LAPAROSCOPIC ASSISTED VAGINAL HYSTERECTOMY;  Surgeon: Marylynn Pearson, MD;  Location: [facility];  Service: Gynecology;  Laterality: N/A;  . LAPAROSCOPIC BILATERAL SALPINGO OOPHERECTOMY Bilateral 04/15/2014   Procedure: LAPAROSCOPIC BILATERAL SALPINGO OOPHORECTOMY;  Surgeon: Marylynn Pearson, MD;  Location: [facility];  Service: Gynecology;  Laterality: Bilateral;  . TUBAL LIGATION      Family History  Problem Relation Age of Onset  . Diabetes Mother   . Hyperlipidemia Mother   . Hypertension Mother   . Anxiety disorder Mother   . Depression Mother   . Drug  abuse Mother   . Prostate cancer Father   . Alcohol abuse Father   . Lung cancer Maternal Grandfather        smoked  . Emphysema Maternal Grandfather        smoked  . COPD Maternal Grandmother        never smoked   . Dementia Maternal Grandmother   . Thyroid cancer Paternal Aunt   . Colon cancer Neg Hx   . Rectal cancer Neg Hx   . Stomach cancer Neg Hx   . Migraines Neg Hx     Social History   Tobacco Use  . Smoking status: Current Every Day Smoker    Packs/day: 2.00    Years: 35.00  Pack years: 70.00    Types: Cigarettes  . Smokeless tobacco: Never Used  . Tobacco comment: 08-17-17- pt reports smokes "almost 2 packs a day"  Substance Use Topics  . Alcohol use: No    Alcohol/week: 0.0 oz    Subjective:  Patient fell on Saturday, August 3rd; she tripped on a curb while walking- face/ head hit the sidewalk and she used her hand to brace for the fall; went to the ER for evaluation- head CT was done which did not show any brain injury; was discharged on Tylenol; presents today with concerns for worsening swelling/ bruising in her left hand- wants to make sure not fractured. X-ray has been done prior to visit today;    Objective:  Vitals:   07/28/18 1418  BP: 122/78  Pulse: 82  Temp: 97.7 F (36.5 C)  TempSrc: Oral  SpO2: 96%  Weight: 179 lb (81.2 kg)  Height: 5\' 2"  (1.575 m)    General: Well developed, well nourished, in no acute distress  Skin : Warm and dry.  Head: Normocephalic and atraumatic  Lungs: Respirations unlabored;  Musculoskeletal: No deformities; marked bruising and swelling of left hand; does have full range of motion Extremities: No edema, cyanosis, clubbing  Vessels: Symmetric bilaterally  Neurologic: Alert and oriented; speech intact; face symmetrical; moves all extremities well; CNII-XII intact without focal deficit   Assessment:  1. Left hand pain     Plan:  Secondary to fall; Xray shows no fracture; discussed need to ice, elevate, wrap with  ACE wrap and Mobic 15 mg qd; follow-up worse, no better.   No follow-ups on file.  No orders of the defined types were placed in this encounter.   Requested Prescriptions   Signed Prescriptions Disp Refills  . meloxicam (MOBIC) 15 MG tablet 30 tablet 0    Sig: Take 1 tablet (15 mg total) by mouth daily.

## 2018-08-19 ENCOUNTER — Encounter: Payer: Self-pay | Admitting: Family Medicine

## 2018-08-19 LAB — HM DIABETES EYE EXAM

## 2018-08-22 ENCOUNTER — Encounter: Payer: Self-pay | Admitting: Internal Medicine

## 2018-09-30 ENCOUNTER — Other Ambulatory Visit: Payer: Self-pay | Admitting: Neurology

## 2018-10-01 ENCOUNTER — Other Ambulatory Visit: Payer: Self-pay | Admitting: Internal Medicine

## 2018-10-01 ENCOUNTER — Telehealth: Payer: Self-pay | Admitting: Neurology

## 2018-10-01 NOTE — Telephone Encounter (Signed)
I called to check status of Botox, it is still pending.

## 2018-10-02 NOTE — Telephone Encounter (Signed)
I called to check status and it is still pending but marked expedited.

## 2018-10-06 ENCOUNTER — Ambulatory Visit (INDEPENDENT_AMBULATORY_CARE_PROVIDER_SITE_OTHER): Payer: BLUE CROSS/BLUE SHIELD | Admitting: Neurology

## 2018-10-06 DIAGNOSIS — G43711 Chronic migraine without aura, intractable, with status migrainosus: Secondary | ICD-10-CM

## 2018-10-06 DIAGNOSIS — R413 Other amnesia: Secondary | ICD-10-CM | POA: Diagnosis not present

## 2018-10-06 MED ORDER — KETOROLAC TROMETHAMINE 60 MG/2ML IM SOLN: 60.0000 mg | Freq: Once | INTRAMUSCULAR | Status: AC

## 2018-10-06 MED ORDER — SUMATRIPTAN SUCCINATE 6 MG/0.5ML ~~LOC~~ SOLN: 6.0000 mg | SUBCUTANEOUS | 6 refills | Status: DC | PRN

## 2018-10-06 NOTE — Progress Notes (Signed)
[id number] 04/12/21 Botox 100 units x 2 [id number] B/B Z60.109

## 2018-10-06 NOTE — Patient Instructions (Signed)
Formal memory testing in December Will call you after that is complete to meet in January See you for botox in 12 weeks  Sumatriptan injection What is this medicine? SUMATRIPTAN (soo ma TRIP tan) is used to treat migraines with or without aura. An aura is a strange feeling or visual disturbance that warns you of an attack. It is not used to prevent migraines. This medicine may be used for other purposes; ask your health care provider or pharmacist if you have questions. COMMON BRAND NAME(S): Alsuma, Imitrex, Imitrex Statdose, Sumavel DosePro System What should I tell my health care provider before I take this medicine? They need to know if you have any of these conditions: -circulation problems in fingers and toes -diabetes -heart disease -high blood pressure -high cholesterol -history of irregular heartbeat -history of stroke -kidney disease -liver disease -postmenopausal or surgical removal of uterus and ovaries -seizures -smoke tobacco -stomach or intestine problems -an unusual or allergic reaction to sumatriptan, other medicines, foods, dyes, or preservatives -pregnant or trying to get pregnant -breast-feeding How should I use this medicine? This medicine is for injection under the skin. Follow the directions on the prescription label. Only use this medicine at the first symptoms of a migraine. It is not for everyday use. If you are using an autoinjector, read the instruction leaflet carefully. A single injection is given just under the skin. Before you make an injection, clean and examine your skin. Do not inject at a place where the skin is damaged or infected. If your symptoms return you can use a second injection. If there is no improvement at all in your symptoms after the first injection, call your doctor or health care professional. Wait at least 1 hour between doses and do not use more than 6 mg as a single dose. Do not use more than 12 mg total in any 24 hour period. Do not  use your medicine more often than directed. Talk to your pediatrician regarding the use of this medicine in children. Special care may be needed. Overdosage: If you think you have taken too much of this medicine contact a poison control center or emergency room at once. NOTE: This medicine is only for you. Do not share this medicine with others. What if I miss a dose? This does not apply; this medicine is not for regular use. What may interact with this medicine? Do not take this medicine with any of the following medicines: -cocaine -ergot alkaloids like dihydroergotamine, ergonovine, ergotamine, methylergonovine -feverfew -MAOIs like Carbex, Eldepryl, Marplan, Nardil, and Parnate -other medicines for migraine headache like almotriptan, eletriptan, frovatriptan, naratriptan, rizatriptan, zolmitriptan -tryptophan This medicine may also interact with the following medications: -certain medicines for depression, anxiety, or psychotic disturbances This list may not describe all possible interactions. Give your health care provider a list of all the medicines, herbs, non-prescription drugs, or dietary supplements you use. Also tell them if you smoke, drink alcohol, or use illegal drugs. Some items may interact with your medicine. What should I watch for while using this medicine? Only take this medicine for a migraine headache. Take it if you get warning symptoms or at the start of a migraine attack. It is not for regular use to prevent migraine attacks. You may get drowsy or dizzy. Do not drive, use machinery, or do anything that needs mental alertness until you know how this medicine affects you. Do not stand or sit up quickly, especially if you are an older patient. This reduces the risk of  dizzy or fainting spells. Alcohol may interfere with the effect of this medicine. Avoid alcoholic drinks. Smoking cigarettes may increase the risk of heart-related side effects from using this medicine. If you  take migraine medicines for 10 or more days a month, your migraines may get worse. Keep a diary of headache days and medicine use. Contact your healthcare professional if your migraine attacks occur more frequently. What side effects may I notice from receiving this medicine? Side effects that you should report to your doctor or health care professional as soon as possible: -allergic reactions like skin rash, itching or hives, swelling of the face, lips, or tongue -bloody or watery diarrhea -hallucination, loss of contact with reality -pain, tingling, numbness in the face, hands, or feet -seizures -signs and symptoms of a blood clot such as breathing problems; changes in vision; chest pain; severe, sudden headache; pain, swelling, warmth in the leg; trouble speaking; sudden numbness or weakness of the face, arm, or leg -signs and symptoms of a dangerous change in heartbeat or heart rhythm like chest pain; dizziness; fast or irregular heartbeat; palpitations, feeling faint or lightheaded; falls; breathing problems -signs and symptoms of a stroke like changes in vision; confusion; trouble speaking or understanding; severe headaches; sudden numbness or weakness of the face, arm, or leg; trouble walking; dizziness; loss of balance or coordination -stomach pain Side effects that usually do not require medical attention (report to your doctor or health care professional if they continue or are bothersome): -changes in taste -facial flushing -headache -muscle cramps -muscle pain -nausea, vomiting -pain, redness, or irritation at site where injected -weak or tired This list may not describe all possible side effects. Call your doctor for medical advice about side effects. You may report side effects to FDA at 1-800-FDA-1088. Where should I keep my medicine? Keep out of the reach of children. Store at room temperature between 2 and 30 degrees C (36 and 86 degrees F). Protect from light. Throw away any  unused medicine after the expiration date. Make sure you receive a puncture-resistant container to dispose of the needles and syringes once you have finished with them. Do not reuse these items. Return the container to your health care professional for proper disposal. Keep the autoinjector device. NOTE: This sheet is a summary. It may not cover all possible information. If you have questions about this medicine, talk to your doctor, pharmacist, or health care provider.  2018 [employer] Standard (2016-01-12 12:41:27)

## 2018-10-06 NOTE — Addendum Note (Signed)
Addended by: Sarina Ill B on: 10/06/2018 12:06 PM   Modules accepted: Orders, Level of Service

## 2018-10-06 NOTE — Addendum Note (Signed)
Addended by: Thomes Cake on: 10/06/2018 12:12 PM   Modules accepted: Orders

## 2018-10-06 NOTE — Progress Notes (Signed)
Interval history : Patient reports significant improvement of greater than 50% migraine frequency and migraine severity. Her baseline was 30 headache days a month and 20 migraine days a month. Since Botox she has 5 headaches or migraines a month great response. But acute management not working, will try imitrex injectables. Also patient continues to c/o memory loss, this is ongoing for years at this point will refer to formal neurocognitive testing. Likely multifactorial including mood disorders, stress, chronic pain unlikely dementia but will order formal memory testing and follow up with patient afterwards in January.  Orders Placed This Encounter  Procedures  . Ambulatory referral to Neuropsychology   Meds ordered this encounter  Medications  . SUMAtriptan (IMITREX) 6 MG/0.5ML SOLN injection    Sig: Inject 0.5 mLs (6 mg total) into the skin every 2 (two) hours as needed for migraine or headache. Take 2nd dose 2hrs later. Max 2x in 24hrs    Dispense:  12 vial    Refill:  6    Please give her auto injectors if available     A total of 15 minutes was spent face-to-face with this patient. Over half this time was spent on counseling patient on the memory diagnosis and different diagnostic and therapeutic options, counseling and coordination of care, risks ans benefits of management, compliance, or risk factor reduction and education.  This does not include time spent on botox injections.

## 2018-10-06 NOTE — Progress Notes (Addendum)
Interval history : Patient reports significant improvement of greater than 50% migraine frequency and migraine severity. Her baseline was 30 headache days a month and 20 migraine days a month. Since Botox she has 5 headaches or migraines a month great response.   Consent Form Botulism Toxin Injection For Chronic Migraine  Reviewed orally with patient, additionally signature is on file:  Botulism toxin has been approved by the Federal drug administration for treatment of chronic migraine. Botulism toxin does not cure chronic migraine and it may not be effective in some patients.  The administration of botulism toxin is accomplished by injecting a small amount of toxin into the muscles of the neck and head. Dosage must be titrated for each individual. Any benefits resulting from botulism toxin tend to wear off after 3 months with a repeat injection required if benefit is to be maintained. Injections are usually done every 3-4 months with maximum effect peak achieved by about 2 or 3 weeks. Botulism toxin is expensive and you should be sure of what costs you will incur resulting from the injection.  The side effects of botulism toxin use for chronic migraine may include:   -Transient, and usually mild, facial weakness with facial injections  -Transient, and usually mild, head or neck weakness with head/neck injections  -Reduction or loss of forehead facial animation due to forehead muscle weakness  -Eyelid drooping  -Dry eye  -Pain at the site of injection or bruising at the site of injection  -Double vision  -Potential unknown long term risks  Contraindications: You should not have Botox if you are pregnant, nursing, allergic to albumin, have an infection, skin condition, or muscle weakness at the site of the injection, or have myasthenia gravis, Lambert-Eaton syndrome, or ALS.  It is also possible that as with any injection, there may be an allergic reaction or no effect from the  medication. Reduced effectiveness after repeated injections is sometimes seen and rarely infection at the injection site may occur. All care will be taken to prevent these side effects. If therapy is given over a long time, atrophy and wasting in the muscle injected may occur. Occasionally the patient's become refractory to treatment because they develop antibodies to the toxin. In this event, therapy needs to be modified.  I have read the above information and consent to the administration of botulism toxin.    BOTOX PROCEDURE NOTE FOR MIGRAINE HEADACHE    Contraindications and precautions discussed with patient(above). Aseptic procedure was observed and patient tolerated procedure. Procedure performed by Dr. Georgia Dom  The condition has existed for more than 6 months, and pt does not have a diagnosis of ALS, Myasthenia Gravis or Lambert-Eaton Syndrome.  Risks and benefits of injections discussed and pt agrees to proceed with the procedure.  Written consent obtained  These injections are medically necessary. Pt  receives good benefits from these injections. These injections do not cause sedations or hallucinations which the oral therapies may cause.  Indication/Diagnosis: chronic migraine BOTOX(J0585) injection was performed according to protocol by [employer]. 200 units of BOTOX was dissolved into 4 cc NS.   NDC: [address]-70   Description of procedure:  The patient was placed in a sitting position. The standard protocol was used for Botox as follows, with 5 units of Botox injected at each site:   -Procerus muscle, midline injection  -Corrugator muscle, bilateral injection  -Frontalis muscle, bilateral injection, with 2 sites each side, medial injection was performed in the upper one third of the frontalis  muscle, in the region vertical from the medial inferior edge of the superior orbital rim. The lateral injection was again in the upper one third of the forehead vertically above the  lateral limbus of the cornea, 1.5 cm lateral to the medial injection site.  -Temporalis muscle injection, 4 sites, bilaterally. The first injection was 3 cm above the tragus of the ear, second injection site was 1.5 cm to 3 cm up from the first injection site in line with the tragus of the ear. The third injection site was 1.5-3 cm forward between the first 2 injection sites. The fourth injection site was 1.5 cm posterior to the second injection site.  -Occipitalis muscle injection, 3 sites, bilaterally. The first injection was done one half way between the occipital protuberance and the tip of the mastoid process behind the ear. The second injection site was done lateral and superior to the first, 1 fingerbreadth from the first injection. The third injection site was 1 fingerbreadth superiorly and medially from the first injection site.  -Cervical paraspinal muscle injection, 2 sites, bilateral knee first injection site was 1 cm from the midline of the cervical spine, 3 cm inferior to the lower border of the occipital protuberance. The second injection site was 1.5 cm superiorly and laterally to the first injection site.  -Trapezius muscle injection was performed at 3 sites, bilaterally. The first injection site was in the upper trapezius muscle halfway between the inflection point of the neck, and the acromion. The second injection site was one half way between the acromion and the first injection site. The third injection was done between the first injection site and the inflection point of the neck.   Will return for repeat injection in 3 months.   A 200 unit sof Botox was used, 155 units were injected, the rest of the Botox was wasted. The patient tolerated the procedure well, there were no complications of the above procedure.

## 2018-10-20 ENCOUNTER — Ambulatory Visit: Payer: BLUE CROSS/BLUE SHIELD | Admitting: Internal Medicine

## 2018-10-20 ENCOUNTER — Other Ambulatory Visit (INDEPENDENT_AMBULATORY_CARE_PROVIDER_SITE_OTHER): Payer: BLUE CROSS/BLUE SHIELD

## 2018-10-20 ENCOUNTER — Encounter: Payer: Self-pay | Admitting: Internal Medicine

## 2018-10-20 VITALS — BP 144/98 | HR 85 | Temp 98.2°F | Resp 16 | Ht 62.0 in | Wt 179.0 lb

## 2018-10-20 DIAGNOSIS — R0609 Other forms of dyspnea: Secondary | ICD-10-CM

## 2018-10-20 DIAGNOSIS — K219 Gastro-esophageal reflux disease without esophagitis: Secondary | ICD-10-CM

## 2018-10-20 DIAGNOSIS — E118 Type 2 diabetes mellitus with unspecified complications: Secondary | ICD-10-CM

## 2018-10-20 DIAGNOSIS — E519 Thiamine deficiency, unspecified: Secondary | ICD-10-CM

## 2018-10-20 DIAGNOSIS — E785 Hyperlipidemia, unspecified: Secondary | ICD-10-CM

## 2018-10-20 DIAGNOSIS — R200 Anesthesia of skin: Secondary | ICD-10-CM | POA: Diagnosis not present

## 2018-10-20 DIAGNOSIS — R0600 Dyspnea, unspecified: Secondary | ICD-10-CM

## 2018-10-20 DIAGNOSIS — I739 Peripheral vascular disease, unspecified: Secondary | ICD-10-CM

## 2018-10-20 DIAGNOSIS — Z23 Encounter for immunization: Secondary | ICD-10-CM | POA: Diagnosis not present

## 2018-10-20 DIAGNOSIS — F411 Generalized anxiety disorder: Secondary | ICD-10-CM | POA: Diagnosis not present

## 2018-10-20 DIAGNOSIS — Z1231 Encounter for screening mammogram for malignant neoplasm of breast: Secondary | ICD-10-CM

## 2018-10-20 DIAGNOSIS — I10 Essential (primary) hypertension: Secondary | ICD-10-CM | POA: Diagnosis not present

## 2018-10-20 LAB — LIPID PANEL
CHOL/HDL RATIO: 3
Cholesterol: 114 mg/dL (ref 0–200)
HDL: 39.7 mg/dL (ref >39.00)
LDL CALC: 54 mg/dL (ref 0–99)
NONHDL: 74.22
Triglycerides: 103 mg/dL (ref 0.0–149.0)
VLDL: 20.6 mg/dL (ref 0.0–40.0)

## 2018-10-20 LAB — CBC WITH DIFFERENTIAL/PLATELET
BASOS PCT: 1.2 % (ref 0.0–3.0)
Basophils Absolute: 0.1 10*3/uL (ref 0.0–0.1)
EOS ABS: 0.2 10*3/uL (ref 0.0–0.7)
EOS PCT: 2.4 % (ref 0.0–5.0)
HEMATOCRIT: 40.7 % (ref 36.0–46.0)
Hemoglobin: 13.7 g/dL (ref 12.0–15.0)
LYMPHS PCT: 27.7 % (ref 12.0–46.0)
Lymphs Abs: 2.1 10*3/uL (ref 0.7–4.0)
MCHC: 33.7 g/dL (ref 30.0–36.0)
MCV: 90.7 fl (ref 78.0–100.0)
MONOS PCT: 8.2 % (ref 3.0–12.0)
Monocytes Absolute: 0.6 10*3/uL (ref 0.1–1.0)
NEUTROS ABS: 4.5 10*3/uL (ref 1.4–7.7)
Neutrophils Relative %: 60.5 % (ref 43.0–77.0)
PLATELETS: 369 10*3/uL (ref 150.0–400.0)
RBC: 4.48 Mil/uL (ref 3.87–5.11)
RDW: 14.8 % (ref 11.5–15.5)
WBC: 7.5 10*3/uL (ref 4.0–10.5)

## 2018-10-20 LAB — URINALYSIS, ROUTINE W REFLEX MICROSCOPIC
Bilirubin Urine: NEGATIVE
HGB URINE DIPSTICK: NEGATIVE
Ketones, ur: NEGATIVE
Nitrite: NEGATIVE
RBC / HPF: NONE SEEN (ref 0–?)
Total Protein, Urine: NEGATIVE
Urine Glucose: NEGATIVE
Urobilinogen, UA: 0.2 (ref 0.0–1.0)
pH: 6.5 (ref 5.0–8.0)

## 2018-10-20 LAB — HEMOGLOBIN A1C: Hgb A1c MFr Bld: 7.6 % — ABNORMAL HIGH (ref 4.6–6.5)

## 2018-10-20 LAB — BRAIN NATRIURETIC PEPTIDE: PRO B NATRI PEPTIDE: 11 pg/mL (ref 0.0–100.0)

## 2018-10-20 LAB — VITAMIN D 25 HYDROXY (VIT D DEFICIENCY, FRACTURES): VITD: 38.99 ng/mL (ref 30.00–100.00)

## 2018-10-20 LAB — TROPONIN I: TNIDX: 0 ug/L (ref 0.00–0.06)

## 2018-10-20 MED ORDER — TRAZODONE HCL 150 MG PO TABS: 150.0000 mg | ORAL_TABLET | Freq: Every day | ORAL | 1 refills | Status: DC

## 2018-10-20 MED ORDER — PANTOPRAZOLE SODIUM 20 MG PO TBEC: 20.0000 mg | DELAYED_RELEASE_TABLET | Freq: Two times a day (BID) | ORAL | 1 refills | Status: DC

## 2018-10-20 MED ORDER — OLMESARTAN MEDOXOMIL 40 MG PO TABS: 40.0000 mg | ORAL_TABLET | Freq: Every day | ORAL | 1 refills | Status: DC

## 2018-10-20 MED ORDER — CLONAZEPAM 1 MG PO TABS: 1.0000 mg | ORAL_TABLET | Freq: Two times a day (BID) | ORAL | 3 refills | Status: DC | PRN

## 2018-10-20 MED ORDER — ASPIRIN EC 81 MG PO TBEC: 81.0000 mg | DELAYED_RELEASE_TABLET | Freq: Every day | ORAL | 1 refills | Status: DC

## 2018-10-20 NOTE — Progress Notes (Signed)
Subjective:  Patient ID: Ashley [name], female    DOB: 07/29/1966  Age: 52 y.o. MRN: [id number]  CC: Hypertension and Diabetes   HPI ELLIONNA BUCKBEE presents for f/up - She complains of a 29-month history of worsening shortness of breath, fatigue, and DOE.  She denies chest pain, diaphoresis, lightheadedness, or dizziness.  She continues to complain of numbness in both feet.  She had a normal NCS/EMG a few years ago.  She had a normal cardiac catheterization 2 years ago.  Outpatient Medications Prior to Visit  Medication Sig Dispense Refill  . Aclidinium Bromide (TUDORZA PRESSAIR) 400 MCG/ACT AEPB Inhale 1 puff into the lungs 2 (two) times daily. 1 each 11  . albuterol (PROVENTIL HFA;VENTOLIN HFA) 108 (90 Base) MCG/ACT inhaler Inhale 2 puffs into the lungs every 6 (six) hours as needed for wheezing or shortness of breath.    Marland Kitchen amitriptyline (ELAVIL) 50 MG tablet Take 1.5 tablets (75 mg total) by mouth at bedtime. 135 tablet 1  . AQUALANCE LANCETS 30G MISC   98  . BAYER CONTOUR TEST test strip Testing for up to tid 100 each 98  . Diclofenac Sodium (PENNSAID) 2 % SOLN Place 1 application onto the skin 2 (two) times daily. 1 Bottle 3  . dicyclomine (BENTYL) 10 MG capsule TAKE 1 CAPSULE BY MOUTH TWICE DAILY AS NEEDED FOR  CRAMPING  AND  ABDIMINAL  PAIN 180 capsule 1  . docusate sodium (COLACE) 100 MG capsule Take 100 mg by mouth at bedtime.    Eduard Roux (AIMOVIG) 140 MG/ML SOAJ Inject 140 mg into the skin every 30 (thirty) days. 1 pen 11  . Ertugliflozin-metFORMIN HCl (SEGLUROMET) 7.04-999 MG TABS Take 1 tablet by mouth 2 (two) times daily. 180 tablet 1  . insulin detemir (LEVEMIR) 100 UNIT/ML injection Inject 0.3 mLs (30 Units total) into the skin daily. 10 mL 3  . Insulin Pen Needle 32G X 6 MM MISC Use daily as directed 100 each 1  . Lancet Devices (ADJUSTABLE LANCING DEVICE) MISC Up to tid testing  98  . promethazine (PHENERGAN) 25 MG tablet Take 1 tablet (25 mg total) by mouth every 6 (six)  hours as needed for nausea or vomiting. 30 tablet 1  . rizatriptan (MAXALT-MLT) 10 MG disintegrating tablet Take 1 tablet (10 mg total) by mouth as needed for migraine. May repeat in 2 hours if needed. Do not take more than 3 tablets in a week. 10 tablet 3  . rosuvastatin (CRESTOR) 20 MG tablet TAKE 1 TABLET BY MOUTH ONCE DAILY 90 tablet 1  . SUMAtriptan (IMITREX) 6 MG/0.5ML SOLN injection Inject 0.5 mLs (6 mg total) into the skin every 2 (two) hours as needed for migraine or headache. Take 2nd dose 2hrs later. Max 2x in 24hrs 12 vial 6  . thiamine 100 MG tablet Take 1 tablet (100 mg total) by mouth daily. 90 tablet 1  . venlafaxine (EFFEXOR) 37.5 MG tablet TAKE 1 TABLET BY MOUTH TWICE DAILY 60 tablet 5  . aspirin EC 81 MG tablet Take 1 tablet (81 mg total) by mouth daily. 90 tablet 1  . clonazePAM (KLONOPIN) 1 MG tablet TAKE 1 TABLET BY MOUTH TWICE DAILY FOR ANXIETY 60 tablet 3  . meloxicam (MOBIC) 15 MG tablet Take 1 tablet (15 mg total) by mouth daily. 30 tablet 0  . pantoprazole (PROTONIX) 20 MG tablet TAKE 1 TABLET BY MOUTH TWICE DAILY 60 tablet 3  . traZODone (DESYREL) 150 MG tablet Take 1 tablet (150 mg  total) by mouth at bedtime. 90 tablet 3  . ondansetron (ZOFRAN) injection 4 mg      No facility-administered medications prior to visit.     ROS Review of Systems  Constitutional: Positive for fatigue. Negative for appetite change, diaphoresis and unexpected weight change.  HENT: Negative.   Eyes: Negative for visual disturbance.  Respiratory: Positive for shortness of breath. Negative for cough, chest tightness and wheezing.   Cardiovascular: Negative for chest pain, palpitations and leg swelling.  Gastrointestinal: Negative for abdominal pain, constipation, diarrhea, nausea and vomiting.  Endocrine: Negative.  Negative for polydipsia, polyphagia and polyuria.  Genitourinary: Negative.  Negative for difficulty urinating.  Musculoskeletal: Negative.  Negative for myalgias.  Skin:  Negative.  Negative for rash.  Neurological: Negative.  Negative for dizziness, weakness, light-headedness and headaches.  Hematological: Negative for adenopathy. Does not bruise/bleed easily.  Psychiatric/Behavioral: Positive for dysphoric mood and sleep disturbance. Negative for decreased concentration, self-injury and suicidal ideas. The patient is nervous/anxious.     Objective:  BP (!) 144/98 Comment: L 144/98 R 138/88  Pulse 85   Temp 98.2 F (36.8 C) (Oral)   Resp 16   Ht 5\' 2"  (1.575 m)   Wt 179 lb (81.2 kg)   LMP 02/12/2014   SpO2 96%   BMI 32.74 kg/m   BP Readings from Last 3 Encounters:  10/20/18 (!) 144/98  07/28/18 122/78  07/26/18 134/72    Wt Readings from Last 3 Encounters:  10/20/18 179 lb (81.2 kg)  07/28/18 179 lb (81.2 kg)  06/24/18 176 lb 1.3 oz (79.9 kg)    Physical Exam  Constitutional: She is oriented to person, place, and time. No distress.  HENT:  Mouth/Throat: Oropharynx is clear and moist. No oropharyngeal exudate.  Eyes: Conjunctivae are normal. No scleral icterus.  Neck: Normal range of motion. Neck supple. No JVD present. No thyromegaly present.  Cardiovascular: Normal rate, regular rhythm and normal heart sounds. Exam reveals no gallop.  No murmur heard. EKG ---  Sinus  Rhythm  -  Nonspecific T-abnormality.   ABNORMAL - no change compared to the prior EKG   Pulmonary/Chest: Effort normal and breath sounds normal. No respiratory distress. She has no wheezes. She has no rales.  Abdominal: Soft. Normal appearance and bowel sounds are normal. She exhibits no mass. There is no hepatosplenomegaly. There is no tenderness.  Musculoskeletal: Normal range of motion. She exhibits no edema, tenderness or deformity.  Lymphadenopathy:    She has no cervical adenopathy.  Neurological: She is alert and oriented to person, place, and time.  Skin: Skin is warm and dry. She is not diaphoretic. No pallor.  Vitals reviewed.   Lab Results  Component  Value Date   WBC 7.5 10/20/2018   HGB 13.7 10/20/2018   HCT 40.7 10/20/2018   PLT 369.0 10/20/2018   GLUCOSE 171 (H) 05/14/2018   CHOL 114 10/20/2018   TRIG 103.0 10/20/2018   HDL 39.70 10/20/2018   LDLDIRECT 152.1 01/19/2013   LDLCALC 54 10/20/2018   ALT 24 05/14/2018   AST 21 05/14/2018   NA 141 05/14/2018   K 3.6 05/14/2018   CL 101 05/14/2018   CREATININE 0.60 05/14/2018   BUN 4 (L) 05/14/2018   CO2 27 05/14/2018   TSH 1.40 12/30/2017   INR 0.91 05/14/2018   HGBA1C 7.6 (H) 10/20/2018   MICROALBUR 0.9 12/30/2017    Dg Hand Complete Left  Result Date: 07/28/2018 CLINICAL DATA:  Fall 2 days ago. Swelling and bruising. Pain of  the left hand. EXAM: LEFT HAND - COMPLETE 3+ VIEW COMPARISON:  None. FINDINGS: Soft tissue swelling is present over the dorsum of the hand. There is no underlying fracture. No radiopaque foreign body is present. IMPRESSION: Soft tissue swelling over the dorsum the hand without underlying fracture or radiopaque foreign body. Electronically Signed   By: San Morelle M.D.   On: 07/28/2018 14:29    Assessment & Plan:   Melva was seen today for hypertension and diabetes.  Diagnoses and all orders for this visit:  DOE (dyspnea on exertion)- She had a normal cardiac cath 2 years ago.  Her EKG is not perfect but it is unchanged.  Troponin and BNP are both normal.  I think her shortness of breath is related to her lung disease.  I have asked her to quit smoking. -     EKG 12-Lead -     CBC with Differential/Platelet; Future -     Troponin I; Future -     Brain natriuretic peptide; Future  Anxiety state -     clonazePAM (KLONOPIN) 1 MG tablet; Take 1 tablet (1 mg total) by mouth 2 (two) times daily as needed for anxiety. -     traZODone (DESYREL) 150 MG tablet; Take 1 tablet (150 mg total) by mouth at bedtime.  GERD without esophagitis -     pantoprazole (PROTONIX) 20 MG tablet; Take 1 tablet (20 mg total) by mouth 2 (two) times daily. -     CBC with  Differential/Platelet; Future  Numbness in feet- I think this is diabetic neuropathy.  Is not a painful condition for her so other than control of her blood sugars I do not think any treatment option should be offered at this time. -     Cancel: Ambulatory referral to Neurology  Claudication of both lower extremities ([place])- She had ABIs done earlier this year which were normal.  Will continue aggressive risk factor modifications. -     Cancel: VAS [place] ABI WITH/WO TBI; Future -     aspirin EC 81 MG tablet; Take 1 tablet (81 mg total) by mouth daily.  Diabetes mellitus with complication ([place])- Her W5I is at 7.6%.  Considering her resources and motivation this is relatively well controlled. -     Cancel: Vitamin B1; Future -     Urinalysis, Routine w reflex microscopic; Future -     Hemoglobin A1c; Future -     olmesartan (BENICAR) 40 MG tablet; Take 1 tablet (40 mg total) by mouth daily.  Thiamine deficiency- I will monitor her B1 level to be certain that she is compliant with thiamine replacement therapy. -     Vitamin B1; Future  Dyslipidemia, goal LDL below 70- She has achieved her LDL goal and is doing well on the statin. -     Lipid panel; Future  Visit for screening mammogram -     MM DIGITAL SCREENING BILATERAL; Future  Essential hypertension- Her blood pressure is not adequately well controlled so I have asked her to start taking an ARB. -     VITAMIN D 25 Hydroxy (Vit-D Deficiency, Fractures); Future -     olmesartan (BENICAR) 40 MG tablet; Take 1 tablet (40 mg total) by mouth daily.  Other orders -     Flu Vaccine QUAD 6+ mos PF IM (Fluarix Quad PF)   I have discontinued Uyen B. Ambriz's meloxicam. I have also changed her clonazePAM and pantoprazole. Additionally, I am having her start on olmesartan. Lastly,  I am having her maintain her docusate sodium, albuterol, rizatriptan, Adjustable Lancing Device, AQUALANCE LANCETS 30G, Aclidinium Bromide, thiamine, promethazine, BAYER  CONTOUR TEST, Insulin Pen Needle, amitriptyline, Diclofenac Sodium, Ertugliflozin-metFORMIN HCl, insulin detemir, rosuvastatin, Erenumab-aooe, venlafaxine, dicyclomine, SUMAtriptan, traZODone, and aspirin EC. We will stop administering ondansetron.  Meds ordered this encounter  Medications  . clonazePAM (KLONOPIN) 1 MG tablet    Sig: Take 1 tablet (1 mg total) by mouth 2 (two) times daily as needed for anxiety.    Dispense:  60 tablet    Refill:  3  . pantoprazole (PROTONIX) 20 MG tablet    Sig: Take 1 tablet (20 mg total) by mouth 2 (two) times daily.    Dispense:  180 tablet    Refill:  1    Please consider 90 day supplies to promote better adherence  . traZODone (DESYREL) 150 MG tablet    Sig: Take 1 tablet (150 mg total) by mouth at bedtime.    Dispense:  90 tablet    Refill:  1  . olmesartan (BENICAR) 40 MG tablet    Sig: Take 1 tablet (40 mg total) by mouth daily.    Dispense:  90 tablet    Refill:  1  . aspirin EC 81 MG tablet    Sig: Take 1 tablet (81 mg total) by mouth daily.    Dispense:  90 tablet    Refill:  1     Follow-up: Return in about 3 months (around 01/20/2019).  Scarlette Calico, MD

## 2018-10-20 NOTE — Patient Instructions (Signed)

## 2018-10-21 ENCOUNTER — Encounter: Payer: Self-pay | Admitting: Internal Medicine

## 2018-10-22 ENCOUNTER — Encounter: Payer: Self-pay | Admitting: Psychology

## 2018-10-22 ENCOUNTER — Ambulatory Visit
Admission: RE | Admit: 2018-10-22 | Discharge: 2018-10-22 | Disposition: A | Discharge status: Home / Self Care | Payer: BLUE CROSS/BLUE SHIELD | Source: Ambulatory Visit | Attending: Internal Medicine | Admitting: Internal Medicine

## 2018-10-22 DIAGNOSIS — Z1231 Encounter for screening mammogram for malignant neoplasm of breast: Secondary | ICD-10-CM

## 2018-10-23 ENCOUNTER — Encounter: Payer: Self-pay | Admitting: Internal Medicine

## 2018-10-23 ENCOUNTER — Other Ambulatory Visit: Payer: Self-pay | Admitting: Internal Medicine

## 2018-10-23 DIAGNOSIS — G43901 Migraine, unspecified, not intractable, with status migrainosus: Secondary | ICD-10-CM

## 2018-10-23 DIAGNOSIS — N6459 Other signs and symptoms in breast: Secondary | ICD-10-CM

## 2018-10-23 LAB — VITAMIN B1: Vitamin B1 (Thiamine): 150 nmol/L — ABNORMAL HIGH (ref 8–30)

## 2018-10-23 MED ORDER — RIZATRIPTAN BENZOATE 10 MG PO TBDP: 10.0000 mg | ORAL_TABLET | ORAL | 3 refills | Status: DC | PRN

## 2018-11-03 ENCOUNTER — Ambulatory Visit
Admission: RE | Admit: 2018-11-03 | Discharge: 2018-11-03 | Disposition: A | Discharge status: Home / Self Care | Payer: BLUE CROSS/BLUE SHIELD | Source: Ambulatory Visit | Attending: Internal Medicine | Admitting: Internal Medicine

## 2018-11-03 DIAGNOSIS — N6459 Other signs and symptoms in breast: Secondary | ICD-10-CM

## 2018-11-03 LAB — HM MAMMOGRAPHY

## 2018-11-12 ENCOUNTER — Telehealth: Payer: Self-pay

## 2018-11-12 ENCOUNTER — Other Ambulatory Visit: Payer: Self-pay | Admitting: Internal Medicine

## 2018-11-12 DIAGNOSIS — G44011 Episodic cluster headache, intractable: Secondary | ICD-10-CM

## 2018-11-12 MED ORDER — AMITRIPTYLINE HCL 50 MG PO TABS: 75.0000 mg | ORAL_TABLET | Freq: Every day | ORAL | 1 refills | Status: DC

## 2018-11-12 NOTE — Telephone Encounter (Signed)
[facility] sent rx request for amitriptylin 50 mg tab.   Please advise if refill is approved.

## 2018-11-17 NOTE — Telephone Encounter (Signed)
PCP sent erx in.

## 2018-11-25 ENCOUNTER — Ambulatory Visit: Payer: Self-pay | Admitting: *Deleted

## 2018-11-25 NOTE — Telephone Encounter (Signed)
Pt calling with complaints of a cough that started on Friday and has progressively worsened. Pt states that the cough is non productive. Pt states she has tried to take Nyquil at night to relieve symptoms. Pt states she has coughing spells that causes her throat to hurt and also has some nasal congestion but denies any fevers at this time. Pt states that she does have some wheezing but not to the point that she thinks she would need to go to the ED. Pt states she has a history of chronic bronchitis and usually gets it 2-3 times per year. No appt available in the office today. Pt scheduled for appt with Hollie Beach, NP on tomorrow due to PCP not being available. Pt advised that if symptoms become worse before scheduled appt to seek treatment in the ED or return call to the office. Pt verbalized understanding.  Reason for Disposition . Wheezing is present  Answer Assessment - Initial Assessment Questions 1. ONSET: "When did the cough begin?"      A little cough on Friday 2. SEVERITY: "How bad is the cough today?"      Coughing spells with 15-16 times and then it will clear up 3. RESPIRATORY DISTRESS: "Describe your breathing."      Short of breath at times with activity 4. FEVER: "Do you have a fever?" If so, ask: "What is your temperature, how was it measured, and when did it start?"     no 5. HEMOPTYSIS: "Are you coughing up any blood?" If so ask: "How much?" (flecks, streaks, tablespoons, etc.)     No, non productive cough 6. TREATMENT: "What have you done so far to treat the cough?" (e.g., meds, fluids, humidifier)     Nyquil and inhaler in the morning at night 7. CARDIAC HISTORY: "Do you have any history of heart disease?" (e.g., heart attack, congestive heart failure)     no 8. LUNG HISTORY: "Do you have any history of lung disease?"  (e.g., pulmonary embolus, asthma, emphysema)     Chronic brohnchitis 2-3 times a year 67. PE RISK FACTORS: "Do you have a history of blood clots?" (or: recent  major surgery, recent prolonged travel, bedridden)     No 10. OTHER SYMPTOMS: "Do you have any other symptoms? (e.g., runny nose, wheezing, chest pain)       Wheezing a little with taking a deep breath 11. PREGNANCY: "Is there any chance you are pregnant?" "When was your last menstrual period?"       n/a 12. TRAVEL: "Have you traveled out of the country in the last month?" (e.g., travel history, exposures) No  Protocols used: COUGH - ACUTE NON-PRODUCTIVE-A-AH

## 2018-11-26 ENCOUNTER — Ambulatory Visit: Payer: BLUE CROSS/BLUE SHIELD | Admitting: Nurse Practitioner

## 2018-11-26 ENCOUNTER — Encounter: Payer: Self-pay | Admitting: Nurse Practitioner

## 2018-11-26 VITALS — BP 100/62 | HR 89 | Temp 98.4°F | Ht 62.0 in | Wt 177.0 lb

## 2018-11-26 DIAGNOSIS — J209 Acute bronchitis, unspecified: Secondary | ICD-10-CM | POA: Diagnosis not present

## 2018-11-26 MED ORDER — DOXYCYCLINE HYCLATE 100 MG PO TABS: 100.0000 mg | ORAL_TABLET | Freq: Two times a day (BID) | ORAL | 0 refills | Status: DC

## 2018-11-26 MED ORDER — HYDROCODONE-HOMATROPINE 5-1.5 MG/5ML PO SYRP: 5.0000 mL | ORAL_SOLUTION | Freq: Three times a day (TID) | ORAL | 0 refills | Status: DC | PRN

## 2018-11-26 NOTE — Progress Notes (Signed)
Ashley [name] is a 52 y.o. female with the following history as recorded in EpicCare:  Patient Active Problem List   Diagnosis Date Noted  . Essential hypertension 10/20/2018  . Chronic migraine without aura, with intractable migraine, so stated, with status migrainosus 07/02/2018  . Thiamine deficiency 01/05/2018  . Claudication of both lower extremities ([place]) 12/30/2017  . Deficiency anemia 12/30/2017  . Constipation 09/12/2017  . Moderate episode of recurrent major depressive disorder ([place]) 05/16/2017  . Numbness in feet 04/25/2017  . Diabetes mellitus with complication ([place])   . 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 Tobacco abuse 01/08/2017  . Chronic bronchitis ([place]) 10/20/2015  . Dyslipidemia, goal LDL below 70 04/12/2015  . Migraine with aura, intractable 08/12/2014  . Routine general medical examination at a health care facility 05/24/2011  . Anxiety state 09/30/2009  . INSOMNIA, CHRONIC 09/30/2009    Current Outpatient Medications  Medication Sig Dispense Refill  . Aclidinium Bromide (TUDORZA PRESSAIR) 400 MCG/ACT AEPB Inhale 1 puff into the lungs 2 (two) times daily. 1 each 11  . albuterol (PROVENTIL HFA;VENTOLIN HFA) 108 (90 Base) MCG/ACT inhaler Inhale 2 puffs into the lungs every 6 (six) hours as needed for wheezing or shortness of breath.    Marland Kitchen amitriptyline (ELAVIL) 50 MG tablet Take 1.5 tablets (75 mg total) by mouth at bedtime. 135 tablet 1  . AQUALANCE LANCETS 30G MISC   98  . aspirin EC 81 MG tablet Take 1 tablet (81 mg total) by mouth daily. 90 tablet 1  . BAYER CONTOUR TEST test strip Testing for up to tid 100 each 98  . clonazePAM (KLONOPIN) 1 MG tablet Take 1 tablet (1 mg total) by mouth 2 (two) times daily as needed for anxiety. 60 tablet 3  . Diclofenac Sodium (PENNSAID) 2 % SOLN Place 1 application onto the skin 2 (two) times daily. 1 Bottle 3  . dicyclomine (BENTYL) 10 MG capsule TAKE 1 CAPSULE BY MOUTH TWICE DAILY AS NEEDED FOR  CRAMPING  AND  ABDIMINAL  PAIN  180 capsule 1  . docusate sodium (COLACE) 100 MG capsule Take 100 mg by mouth at bedtime.    Eduard Roux (AIMOVIG) 140 MG/ML SOAJ Inject 140 mg into the skin every 30 (thirty) days. 1 pen 11  . Ertugliflozin-metFORMIN HCl (SEGLUROMET) 7.04-999 MG TABS Take 1 tablet by mouth 2 (two) times daily. 180 tablet 1  . insulin detemir (LEVEMIR) 100 UNIT/ML injection Inject 0.3 mLs (30 Units total) into the skin daily. 10 mL 3  . Insulin Pen Needle 32G X 6 MM MISC Use daily as directed 100 each 1  . Lancet Devices (ADJUSTABLE LANCING DEVICE) MISC Up to tid testing  98  . olmesartan (BENICAR) 40 MG tablet Take 1 tablet (40 mg total) by mouth daily. 90 tablet 1  . pantoprazole (PROTONIX) 20 MG tablet Take 1 tablet (20 mg total) by mouth 2 (two) times daily. 180 tablet 1  . promethazine (PHENERGAN) 25 MG tablet Take 1 tablet (25 mg total) by mouth every 6 (six) hours as needed for nausea or vomiting. 30 tablet 1  . rizatriptan (MAXALT-MLT) 10 MG disintegrating tablet Take 1 tablet (10 mg total) by mouth as needed for migraine. May repeat in 2 hours if needed. Do not take more than 3 tablets in a week. 10 tablet 3  . rosuvastatin (CRESTOR) 20 MG tablet TAKE 1 TABLET BY MOUTH ONCE DAILY 90 tablet 1  . thiamine 100 MG tablet Take 1 tablet (100 mg total) by mouth daily.  90 tablet 1  . traZODone (DESYREL) 150 MG tablet Take 1 tablet (150 mg total) by mouth at bedtime. 90 tablet 1  . venlafaxine (EFFEXOR) 37.5 MG tablet TAKE 1 TABLET BY MOUTH TWICE DAILY 60 tablet 5  . doxycycline (VIBRA-TABS) 100 MG tablet Take 1 tablet (100 mg total) by mouth 2 (two) times daily. 14 tablet 0  . HYDROcodone-homatropine (HYCODAN) 5-1.5 MG/5ML syrup Take 5 mLs by mouth every 8 (eight) hours as needed for cough. 120 mL 0   No current facility-administered medications for this visit.     Allergies: Penicillins and Topamax [topiramate]  Past Medical History:  Diagnosis Date  . Anxiety   . Bronchitis    uses inhaler if needed  for bronchitis, lasted used -2014  . Cataract    bilateral  . Chronic headaches    migraines - in past, uses Phenergan for nausea   . Deficiency anemia 12/30/2017  . Diabetes mellitus without complication ([place])    taken off Metformin since 04/2014, HgbA1C - normal, will follow up with PCP- Dr. Deborra Medina, 07/2014  . GERD (gastroesophageal reflux disease)   . H/O exercise stress test 2011   done at [facility]- told that it was WNL, done due to pt. having panic attacks   . History of blood transfusion 1966/08/29   at birth in [place], [place], unsure number of units  . Poor dentition    very poor oral health   . SVD (spontaneous vaginal delivery)    x 3  . Tobacco abuse     Past Surgical History:  Procedure Laterality Date  . ABDOMINAL HYSTERECTOMY    . APPENDECTOMY  1988  . CARDIAC CATHETERIZATION N/A 08/17/2016   Procedure: Left Heart Cath and Coronary Angiography;  Surgeon: Burnell Blanks, MD;  Location: [facility];  Service: Cardiovascular;  Laterality: N/A;  . CHOLECYSTECTOMY N/A 06/07/2014   Procedure: LAPAROSCOPIC CHOLECYSTECTOMY;  Surgeon: Ralene Ok, MD;  Location: [facility];  Service: General;  Laterality: N/A;  . CHOLECYSTECTOMY  June 07 2014  . COLONOSCOPY  02/16/2014   normal   . ESOPHAGOGASTRODUODENOSCOPY  12/28/2013  . KNEE ARTHROSCOPY  1995   left  . LAPAROSCOPIC ASSISTED VAGINAL HYSTERECTOMY N/A 04/15/2014   Procedure: LAPAROSCOPIC ASSISTED VAGINAL HYSTERECTOMY;  Surgeon: Marylynn Pearson, MD;  Location: [facility];  Service: Gynecology;  Laterality: N/A;  . LAPAROSCOPIC BILATERAL SALPINGO OOPHERECTOMY Bilateral 04/15/2014   Procedure: LAPAROSCOPIC BILATERAL SALPINGO OOPHORECTOMY;  Surgeon: Marylynn Pearson, MD;  Location: [facility];  Service: Gynecology;  Laterality: Bilateral;  . TUBAL LIGATION      Family History  Problem Relation Age of Onset  . Diabetes Mother   . Hyperlipidemia Mother   . Hypertension Mother   . Anxiety disorder Mother   . Depression Mother   .  Drug abuse Mother   . Prostate cancer Father   . Alcohol abuse Father   . Lung cancer Maternal Grandfather        smoked  . Emphysema Maternal Grandfather        smoked  . COPD Maternal Grandmother        never smoked   . Dementia Maternal Grandmother   . Thyroid cancer Paternal Aunt   . Colon cancer Neg Hx   . Rectal cancer Neg Hx   . Stomach cancer Neg Hx   . Migraines Neg Hx     Social History   Tobacco Use  . Smoking status: Current Every Day Smoker    Packs/day: 2.00  Years: 35.00    Pack years: 70.00    Types: Cigarettes  . Smokeless tobacco: Never Used  . Tobacco comment: 08-17-17- pt reports smokes "almost 2 packs a day"  Substance Use Topics  . Alcohol use: No    Alcohol/week: 0.0 standard drinks     Subjective:  Ms Nass is here today requesting evaluation of acute complaint of cough/cold symptoms, which first began about 6 days ago this past Friday /Saturday and feels symptoms are getting worse since onset Reports: nonproductive cough, wheezing, sore throat, malaise Denies: fevers, chills, syncope, confusion, body aches, headaches, sinus pain/pressure, ear pain/ pressure, chest pain, shortness of breath, abdominal pain, nausea, vomiting, diarrhea Smoker? Current smoker Tried at home: nyquil- no relief. Does use tudorza daily as prescribed. Has albuterol inhaler but has not used it  Hx of bronchitis  ROS- See HPI   Objective:  Vitals:   11/26/18 1002  BP: 100/62  Pulse: 89  Temp: 98.4 F (36.9 C)  TempSrc: Oral  SpO2: 96%  Weight: 177 lb (80.3 kg)  Height: 5\' 2"  (1.575 m)    General: Well developed, well nourished, in no acute distress  Skin : Warm and dry.  Head: Normocephalic and atraumatic  Eyes: Sclera and conjunctiva clear; pupils round and reactive to light; extraocular movements intact  Ears: External normal; canals clear; tympanic membranes normal  Oropharynx: Pink, supple. No suspicious lesions  Neck: Supple without thyromegaly,  adenopathy  Lungs: Respirations unlabored; mild LLF wheezing, cleared with deep breathing CVS exam: normal rate and regular rhythm, S1 and S2 normal.  Extremities: No edema, cyanosis Vessels: Symmetric bilaterally  Neurologic: Alert and oriented; speech intact; face symmetrical; moves all extremities well; CNII-XII intact without focal deficit  Psychiatric: Normal mood and affect.   Assessment:  1. Acute bronchitis, unspecified organism     Plan:   Due to duration, worsening symptoms, will treat with antibiotic course and Scheduled albuterol treatments at home She is also requesting cough syrup to help her sleep- hycodan sent Medication dosing, side effects-including drowsiness discussed. She was instructed not to operate machinery, drink alcohol or combine hycodan with other medications that can cause drowsiness Home management, red flags and return precautions including when to seek immediate care discussed and printed on AVS She was also advised to stop smoking  No follow-ups on file.  No orders of the defined types were placed in this encounter.   Requested Prescriptions   Signed Prescriptions Disp Refills  . doxycycline (VIBRA-TABS) 100 MG tablet 14 tablet 0    Sig: Take 1 tablet (100 mg total) by mouth 2 (two) times daily.  Marland Kitchen HYDROcodone-homatropine (HYCODAN) 5-1.5 MG/5ML syrup 120 mL 0    Sig: Take 5 mLs by mouth every 8 (eight) hours as needed for cough.

## 2018-11-26 NOTE — Patient Instructions (Signed)
Take doxycycline twice daily for 7 days  Please use albuterol 1-2 puffs every 4-6 hours for the next 2 days then back to as needed only  Please follow up for fevers over 101, if your symptoms get worse, or if your symptoms dont improve with the antibiotic.   Acute Bronchitis, Adult Acute bronchitis is when air tubes (bronchi) in the lungs suddenly get swollen. The condition can make it hard to breathe. It can also cause these symptoms:  A cough.  Coughing up clear, yellow, or green mucus.  Wheezing.  Chest congestion.  Shortness of breath.  A fever.  Body aches.  Chills.  A sore throat.  Follow these instructions at home: Medicines  Take over-the-counter and prescription medicines only as told by your doctor.  If you were prescribed an antibiotic medicine, take it as told by your doctor. Do not stop taking the antibiotic even if you start to feel better. General instructions  Rest.  Drink enough fluids to keep your pee (urine) clear or pale yellow.  Avoid smoking and secondhand smoke. If you smoke and you need help quitting, ask your doctor. Quitting will help your lungs heal faster.  Use an inhaler, cool mist vaporizer, or humidifier as told by your doctor.  Keep all follow-up visits as told by your doctor. This is important. How is this prevented? To lower your risk of getting this condition again:  Wash your hands often with soap and water. If you cannot use soap and water, use hand sanitizer.  Avoid contact with people who have cold symptoms.  Try not to touch your hands to your mouth, nose, or eyes.  Make sure to get the flu shot every year.  Contact a doctor if:  Your symptoms do not get better in 2 weeks. Get help right away if:  You cough up blood.  You have chest pain.  You have very bad shortness of breath.  You become dehydrated.  You faint (pass out) or keep feeling like you are going to pass out.  You keep throwing up  (vomiting).  You have a very bad headache.  Your fever or chills gets worse. This information is not intended to replace advice given to you by your health care provider. Make sure you discuss any questions you have with your health care provider. Document Released: 05/28/2008 Document Revised: 07/18/2016 Document Reviewed: 05/30/2016 Elsevier Interactive Patient Education  [employer].

## 2018-11-27 ENCOUNTER — Other Ambulatory Visit: Payer: Self-pay | Admitting: Nurse Practitioner

## 2018-11-27 ENCOUNTER — Encounter: Payer: Self-pay | Admitting: Nurse Practitioner

## 2018-11-27 MED ORDER — PROMETHAZINE-CODEINE 6.25-10 MG/5ML PO SOLN: 5.0000 mL | Freq: Four times a day (QID) | ORAL | 0 refills | Status: DC | PRN

## 2018-12-11 ENCOUNTER — Telehealth: Payer: Self-pay | Admitting: *Deleted

## 2018-12-11 NOTE — Telephone Encounter (Signed)
Received notification that Aimovig PA needed. Completed on Cover My Meds. KEY: [id number]. Anticipate determination within 3 business days.   If [employer]  has not responded in 3 business days or if you have any questions about your submission, contact [place] at [phone].

## 2018-12-15 NOTE — Telephone Encounter (Signed)
Again, insurance has denied Aimovig because pt is on Botox. In past pt was advised to use copay card for the [place].

## 2018-12-26 ENCOUNTER — Encounter

## 2018-12-26 ENCOUNTER — Encounter: Payer: BLUE CROSS/BLUE SHIELD | Admitting: Psychology

## 2019-01-01 ENCOUNTER — Other Ambulatory Visit: Payer: Self-pay | Admitting: Neurology

## 2019-01-05 ENCOUNTER — Telehealth: Payer: Self-pay | Admitting: Neurology

## 2019-01-05 NOTE — Telephone Encounter (Signed)
I called [employer] to check status, they stated that it has been assigned to someone and is pending verification. They marked it stat.

## 2019-01-07 ENCOUNTER — Ambulatory Visit: Payer: BLUE CROSS/BLUE SHIELD | Admitting: Neurology

## 2019-01-14 ENCOUNTER — Other Ambulatory Visit: Payer: Self-pay | Admitting: Internal Medicine

## 2019-01-14 DIAGNOSIS — E785 Hyperlipidemia, unspecified: Secondary | ICD-10-CM

## 2019-01-14 DIAGNOSIS — E118 Type 2 diabetes mellitus with unspecified complications: Secondary | ICD-10-CM

## 2019-01-14 DIAGNOSIS — I739 Peripheral vascular disease, unspecified: Secondary | ICD-10-CM

## 2019-01-20 ENCOUNTER — Encounter: Payer: Self-pay | Admitting: Psychology

## 2019-01-26 ENCOUNTER — Other Ambulatory Visit: Payer: Self-pay | Admitting: Internal Medicine

## 2019-01-26 DIAGNOSIS — F411 Generalized anxiety disorder: Secondary | ICD-10-CM

## 2019-02-10 ENCOUNTER — Other Ambulatory Visit (INDEPENDENT_AMBULATORY_CARE_PROVIDER_SITE_OTHER): Payer: PRIVATE HEALTH INSURANCE

## 2019-02-10 ENCOUNTER — Other Ambulatory Visit (HOSPITAL_COMMUNITY)
Admission: RE | Admit: 2019-02-10 | Discharge: 2019-02-10 | Disposition: A | Discharge status: Home / Self Care | Payer: PRIVATE HEALTH INSURANCE | Source: Ambulatory Visit | Attending: Internal Medicine | Admitting: Internal Medicine

## 2019-02-10 ENCOUNTER — Encounter: Payer: Self-pay | Admitting: Internal Medicine

## 2019-02-10 ENCOUNTER — Other Ambulatory Visit: Payer: PRIVATE HEALTH INSURANCE

## 2019-02-10 ENCOUNTER — Ambulatory Visit (INDEPENDENT_AMBULATORY_CARE_PROVIDER_SITE_OTHER): Payer: PRIVATE HEALTH INSURANCE | Admitting: Internal Medicine

## 2019-02-10 VITALS — BP 112/80 | HR 79 | Temp 97.9°F | Ht 62.0 in | Wt 176.2 lb

## 2019-02-10 DIAGNOSIS — G43119 Migraine with aura, intractable, without status migrainosus: Secondary | ICD-10-CM

## 2019-02-10 DIAGNOSIS — Z124 Encounter for screening for malignant neoplasm of cervix: Secondary | ICD-10-CM

## 2019-02-10 DIAGNOSIS — E519 Thiamine deficiency, unspecified: Secondary | ICD-10-CM | POA: Diagnosis not present

## 2019-02-10 DIAGNOSIS — E1165 Type 2 diabetes mellitus with hyperglycemia: Secondary | ICD-10-CM

## 2019-02-10 DIAGNOSIS — N898 Other specified noninflammatory disorders of vagina: Secondary | ICD-10-CM

## 2019-02-10 DIAGNOSIS — I10 Essential (primary) hypertension: Secondary | ICD-10-CM | POA: Diagnosis not present

## 2019-02-10 DIAGNOSIS — K2100 Gastro-esophageal reflux disease with esophagitis, without bleeding: Secondary | ICD-10-CM | POA: Insufficient documentation

## 2019-02-10 DIAGNOSIS — M5416 Radiculopathy, lumbar region: Secondary | ICD-10-CM

## 2019-02-10 DIAGNOSIS — E118 Type 2 diabetes mellitus with unspecified complications: Secondary | ICD-10-CM

## 2019-02-10 LAB — CBC WITH DIFFERENTIAL/PLATELET
Basophils Absolute: 0.1 10*3/uL (ref 0.0–0.1)
Basophils Relative: 0.8 % (ref 0.0–3.0)
Eosinophils Absolute: 0.2 10*3/uL (ref 0.0–0.7)
Eosinophils Relative: 2 % (ref 0.0–5.0)
HCT: 41.6 % (ref 36.0–46.0)
Hemoglobin: 14.1 g/dL (ref 12.0–15.0)
Lymphocytes Relative: 28.3 % (ref 12.0–46.0)
Lymphs Abs: 2.3 10*3/uL (ref 0.7–4.0)
MCHC: 33.9 g/dL (ref 30.0–36.0)
MCV: 92.2 fl (ref 78.0–100.0)
Monocytes Absolute: 0.6 10*3/uL (ref 0.1–1.0)
Monocytes Relative: 7.2 % (ref 3.0–12.0)
Neutro Abs: 4.9 10*3/uL (ref 1.4–7.7)
Neutrophils Relative %: 61.7 % (ref 43.0–77.0)
Platelets: 371 10*3/uL (ref 150.0–400.0)
RBC: 4.51 Mil/uL (ref 3.87–5.11)
RDW: 14.8 % (ref 11.5–15.5)
WBC: 8 10*3/uL (ref 4.0–10.5)

## 2019-02-10 LAB — URINALYSIS, ROUTINE W REFLEX MICROSCOPIC
Bilirubin Urine: NEGATIVE
Hgb urine dipstick: NEGATIVE
Ketones, ur: NEGATIVE
Leukocytes,Ua: NEGATIVE
Nitrite: NEGATIVE
Specific Gravity, Urine: <=1.005 — AB (ref 1.000–1.030)
Total Protein, Urine: NEGATIVE
Urine Glucose: NEGATIVE
Urobilinogen, UA: 0.2 (ref 0.0–1.0)
pH: 6.5 (ref 5.0–8.0)

## 2019-02-10 LAB — WET PREP, GENITAL
Clue Cells Wet Prep HPF POC: NONE SEEN — AB
TRICH WET PREP: NONE SEEN — AB
Yeast Wet Prep HPF POC: NONE SEEN — AB

## 2019-02-10 LAB — BASIC METABOLIC PANEL
BUN: 16 mg/dL (ref 6–23)
CO2: 25 mEq/L (ref 19–32)
Calcium: 9.6 mg/dL (ref 8.4–10.5)
Chloride: 101 mEq/L (ref 96–112)
Creatinine, Ser: 0.83 mg/dL (ref 0.40–1.20)
GFR: 71.89 mL/min (ref >60.00)
Glucose, Bld: 199 mg/dL — ABNORMAL HIGH (ref 70–99)
POTASSIUM: 4.7 meq/L (ref 3.5–5.1)
Sodium: 137 mEq/L (ref 135–145)

## 2019-02-10 LAB — MICROALBUMIN / CREATININE URINE RATIO
Creatinine,U: 31.2 mg/dL
Microalb Creat Ratio: 2.2 mg/g (ref 0.0–30.0)
Microalb, Ur: <0.7 mg/dL (ref 0.0–1.9)

## 2019-02-10 LAB — HEMOGLOBIN A1C: Hgb A1c MFr Bld: 7.9 % — ABNORMAL HIGH (ref 4.6–6.5)

## 2019-02-10 MED ORDER — ERTUGLIFLOZIN-METFORMIN HCL 2.5-1000 MG PO TABS: 1.0000 | ORAL_TABLET | Freq: Two times a day (BID) | ORAL | 1 refills | Status: DC

## 2019-02-10 MED ORDER — PANTOPRAZOLE SODIUM 40 MG PO TBEC: 40.0000 mg | DELAYED_RELEASE_TABLET | Freq: Every day | ORAL | 1 refills | Status: DC

## 2019-02-10 NOTE — Progress Notes (Signed)
Subjective:  Patient ID: Ashley [name], female    DOB: May 05, 1966  Age: 53 y.o. MRN: [id number]  CC: Back Pain; Diabetes; and Gastroesophageal Reflux   HPI Ashley [name] presents for f/up - She complains of a 2-year history of intermittent, worsening, sharp low back pain that radiates into both lower extremities.  She also has chronic tingling in her lower extremities.  She denies lower extremity weakness or numbness.  She tells me she saw a neurologist and they could not figure out what was causing her symptoms.  She does not think she has ever had any x-rays of her lower back.  She is controlling the pain with ibuprofen.  She complains of heartburn and dysphagia.  She is not currently taking the PPI because her insurance would not pay for twice daily dosing of pantoprazole.  Outpatient Medications Prior to Visit  Medication Sig Dispense Refill  . Aclidinium Bromide (TUDORZA PRESSAIR) 400 MCG/ACT AEPB Inhale 1 puff into the lungs 2 (two) times daily. 1 each 11  . albuterol (PROVENTIL HFA;VENTOLIN HFA) 108 (90 Base) MCG/ACT inhaler Inhale 2 puffs into the lungs every 6 (six) hours as needed for wheezing or shortness of breath.    Marland Kitchen amitriptyline (ELAVIL) 50 MG tablet Take 1.5 tablets (75 mg total) by mouth at bedtime. 135 tablet 1  . AQUALANCE LANCETS 30G MISC   98  . aspirin EC 81 MG tablet Take 1 tablet (81 mg total) by mouth daily. 90 tablet 1  . BAYER CONTOUR TEST test strip Testing for up to tid 100 each 98  . BOTOX 200 units SOLR INJECT 155 UNITS IN THE MUSCLE INTO HEAD AND NECK MUSCLES EVERY 3 MONTHS BY PROVIDER 1 each 0  . clonazePAM (KLONOPIN) 1 MG tablet Take 1 tablet (1 mg total) by mouth 2 (two) times daily as needed for anxiety. 60 tablet 3  . Diclofenac Sodium (PENNSAID) 2 % SOLN Place 1 application onto the skin 2 (two) times daily. 1 Bottle 3  . dicyclomine (BENTYL) 10 MG capsule TAKE 1 CAPSULE BY MOUTH TWICE DAILY AS NEEDED FOR  CRAMPING  AND  ABDIMINAL  PAIN 180 capsule 1  .  docusate sodium (COLACE) 100 MG capsule Take 100 mg by mouth at bedtime.    Eduard Roux (AIMOVIG) 140 MG/ML SOAJ Inject 140 mg into the skin every 30 (thirty) days. 1 pen 11  . insulin detemir (LEVEMIR) 100 UNIT/ML injection Inject 0.3 mLs (30 Units total) into the skin daily. 10 mL 3  . Insulin Pen Needle 32G X 6 MM MISC Use daily as directed 100 each 1  . Lancet Devices (ADJUSTABLE LANCING DEVICE) MISC Up to tid testing  98  . olmesartan (BENICAR) 40 MG tablet Take 1 tablet (40 mg total) by mouth daily. 90 tablet 1  . rizatriptan (MAXALT-MLT) 10 MG disintegrating tablet Take 1 tablet (10 mg total) by mouth as needed for migraine. May repeat in 2 hours if needed. Do not take more than 3 tablets in a week. 10 tablet 3  . rosuvastatin (CRESTOR) 20 MG tablet TAKE 1 TABLET BY MOUTH DAILY 90 tablet 0  . thiamine 100 MG tablet Take 1 tablet (100 mg total) by mouth daily. 90 tablet 1  . traZODone (DESYREL) 150 MG tablet Take 1 tablet (150 mg total) by mouth at bedtime. 90 tablet 1  . venlafaxine (EFFEXOR) 37.5 MG tablet TAKE 1 TABLET BY MOUTH TWICE DAILY 180 tablet 1  . doxycycline (VIBRA-TABS) 100 MG tablet Take 1  tablet (100 mg total) by mouth 2 (two) times daily. 14 tablet 0  . Ertugliflozin-metFORMIN HCl (SEGLUROMET) 7.04-999 MG TABS Take 1 tablet by mouth 2 (two) times daily. 180 tablet 1  . pantoprazole (PROTONIX) 20 MG tablet Take 1 tablet (20 mg total) by mouth 2 (two) times daily. 180 tablet 1  . promethazine (PHENERGAN) 25 MG tablet Take 1 tablet (25 mg total) by mouth every 6 (six) hours as needed for nausea or vomiting. 30 tablet 1  . Promethazine-Codeine 6.25-10 MG/5ML SOLN Take 5 mLs by mouth every 6 (six) hours as needed. 118 mL 0   No facility-administered medications prior to visit.     ROS Review of Systems  Constitutional: Negative.  Negative for appetite change, diaphoresis, fatigue, fever and unexpected weight change.  HENT: Positive for trouble swallowing. Negative for sore  throat and voice change.   Eyes: Negative.  Negative for visual disturbance.  Respiratory: Negative for cough, chest tightness, shortness of breath and wheezing.   Cardiovascular: Negative for chest pain, palpitations and leg swelling.  Gastrointestinal: Positive for nausea. Negative for abdominal pain, blood in stool, constipation, diarrhea and vomiting.  Endocrine: Negative for polydipsia, polyphagia and polyuria.  Genitourinary: Negative.  Negative for difficulty urinating.  Musculoskeletal: Positive for back pain. Negative for arthralgias, joint swelling, myalgias and neck pain.  Skin: Negative.  Negative for color change and rash.  Neurological: Negative.  Negative for dizziness, weakness and numbness.  Hematological: Negative for adenopathy. Does not bruise/bleed easily.  Psychiatric/Behavioral: Negative.     Objective:  BP 112/80 (BP Location: Left Arm, Patient Position: Sitting, Cuff Size: Normal)   Pulse 79   Temp 97.9 F (36.6 C) (Oral)   Ht 5\' 2"  (1.575 m)   Wt 176 lb 4 oz (79.9 kg)   LMP 02/12/2014   SpO2 97%   BMI 32.24 kg/m   BP Readings from Last 3 Encounters:  02/10/19 112/80  11/26/18 100/62  10/20/18 (!) 144/98    Wt Readings from Last 3 Encounters:  02/10/19 176 lb 4 oz (79.9 kg)  11/26/18 177 lb (80.3 kg)  10/20/18 179 lb (81.2 kg)    Physical Exam Vitals signs reviewed. Exam conducted with a chaperone present.  Constitutional:      Appearance: She is not ill-appearing or diaphoretic.  HENT:     Nose: No congestion.     Mouth/Throat:     Mouth: Mucous membranes are moist.     Pharynx: Oropharynx is clear. No oropharyngeal exudate or posterior oropharyngeal erythema.  Eyes:     General: No scleral icterus.    Conjunctiva/sclera: Conjunctivae normal.  Neck:     Musculoskeletal: Normal range of motion and neck supple. No muscular tenderness.  Cardiovascular:     Rate and Rhythm: Normal rate and regular rhythm.     Pulses: Normal pulses.      Heart sounds: No murmur. No friction rub. No gallop.   Pulmonary:     Effort: Pulmonary effort is normal. No respiratory distress.     Breath sounds: No stridor. No wheezing, rhonchi or rales.  Abdominal:     General: Bowel sounds are normal.     Palpations: There is no hepatomegaly, splenomegaly or mass.     Tenderness: There is no abdominal tenderness. There is no guarding.     Hernia: There is no hernia in the right inguinal area or left inguinal area.  Genitourinary:    Exam position: Supine.     Pubic Area: No rash.  Labia:        Right: No rash, tenderness, lesion or injury.        Left: No rash, tenderness, lesion or injury.      Urethra: No prolapse, urethral pain, urethral swelling or urethral lesion.     Vagina: No foreign body. Vaginal discharge present. No erythema or bleeding.     Cervix: No cervical bleeding.     Uterus: Normal. Not enlarged and not tender.      Adnexa: Right adnexa normal and left adnexa normal.     Rectum: Guaiac result positive. Internal hemorrhoid present. No mass, tenderness, anal fissure or external hemorrhoid. Normal anal tone.  Musculoskeletal: Normal range of motion.        General: No swelling.     Right lower leg: No edema.     Left lower leg: No edema.  Lymphadenopathy:     Cervical: No cervical adenopathy.     Lower Body: No right inguinal adenopathy. No left inguinal adenopathy.  Skin:    General: Skin is warm and dry.     Coloration: Skin is not pale.  Neurological:     General: No focal deficit present.     Mental Status: She is oriented to person, place, and time. Mental status is at baseline.     Lab Results  Component Value Date   WBC 8.0 02/10/2019   HGB 14.1 02/10/2019   HCT 41.6 02/10/2019   PLT 371.0 02/10/2019   GLUCOSE 199 (H) 02/10/2019   CHOL 114 10/20/2018   TRIG 103.0 10/20/2018   HDL 39.70 10/20/2018   LDLDIRECT 152.1 01/19/2013   LDLCALC 54 10/20/2018   ALT 24 05/14/2018   AST 21 05/14/2018   NA 137  02/10/2019   K 4.7 02/10/2019   CL 101 02/10/2019   CREATININE 0.83 02/10/2019   BUN 16 02/10/2019   CO2 25 02/10/2019   TSH 1.40 12/30/2017   INR 0.91 05/14/2018   HGBA1C 7.9 (H) 02/10/2019   MICROALBUR <0.7 02/10/2019    US Breast Ltd Uni Right Inc Axilla  Result Date: 11/03/2018 CLINICAL DATA:  53 year old patient due for annual examination. She describes that her right nipple has always been smaller, flatter, and undeveloped compared to the left. She relates this to a history of shingles as a 12-year-old, that affected the right nipple area. She is concerned that the small right nipple no longer becomes erect. She does not palpate a breast mass. EXAM: DIGITAL DIAGNOSTIC BILATERAL MAMMOGRAM WITH CAD AND TOMO ULTRASOUND RIGHT BREAST COMPARISON:  Previous exam(s). ACR Breast Density Category c: The breast tissue is heterogeneously dense, which may obscure small masses. FINDINGS: Stable parenchymal pattern bilaterally. Mammographically, the right nipple is asymmetric and flat/inverted. This appears unchanged on mammograms dating back to at least 2010. Portion of the pectoralis muscle is not included on the left, due to left shoulder decreased range of motion. No breast mass, architectural distortion, or suspicious microcalcification is identified in either breast. Mammographic images were processed with CAD. On physical exam, the right nipple is smaller than the left and is inverted and soft. There is no mass palpated in the retroareolar periareolar right breast. The skin of the breast and nipple areolar complex appears normal. Targeted ultrasound is performed, showing normal retroareolar and periareolar breast parenchyma. No suspicious findings. IMPRESSION: No evidence of malignancy in either breast. Chronic developmental abnormality/smaller, inverted right nipple, with no evidence of malignancy. RECOMMENDATION: Screening mammogram in one year.(Code:SM-B-01Y) I have discussed the findings and  recommendations with  the patient. Results were also provided in writing at the conclusion of the visit. If applicable, a reminder letter will be sent to the patient regarding the next appointment. BI-RADS CATEGORY  1: Negative. Electronically Signed   By: Curlene Dolphin M.D.   On: 11/03/2018 11:29   Mm Diag Breast Tomo Bilateral  Result Date: 11/03/2018 CLINICAL DATA:  53 year old patient due for annual examination. She describes that her right nipple has always been smaller, flatter, and undeveloped compared to the left. She relates this to a history of shingles as a 80-year-old, that affected the right nipple area. She is concerned that the small right nipple no longer becomes erect. She does not palpate a breast mass. EXAM: DIGITAL DIAGNOSTIC BILATERAL MAMMOGRAM WITH CAD AND TOMO ULTRASOUND RIGHT BREAST COMPARISON:  Previous exam(s). ACR Breast Density Category c: The breast tissue is heterogeneously dense, which may obscure small masses. FINDINGS: Stable parenchymal pattern bilaterally. Mammographically, the right nipple is asymmetric and flat/inverted. This appears unchanged on mammograms dating back to at least 2010. Portion of the pectoralis muscle is not included on the left, due to left shoulder decreased range of motion. No breast mass, architectural distortion, or suspicious microcalcification is identified in either breast. Mammographic images were processed with CAD. On physical exam, the right nipple is smaller than the left and is inverted and soft. There is no mass palpated in the retroareolar periareolar right breast. The skin of the breast and nipple areolar complex appears normal. Targeted ultrasound is performed, showing normal retroareolar and periareolar breast parenchyma. No suspicious findings. IMPRESSION: No evidence of malignancy in either breast. Chronic developmental abnormality/smaller, inverted right nipple, with no evidence of malignancy. RECOMMENDATION: Screening mammogram in one  year.(Code:SM-B-01Y) I have discussed the findings and recommendations with the patient. Results were also provided in writing at the conclusion of the visit. If applicable, a reminder letter will be sent to the patient regarding the next appointment. BI-RADS CATEGORY  1: Negative. Electronically Signed   By: Curlene Dolphin M.D.   On: 11/03/2018 11:29    Assessment & Plan:   Ashley [name] was seen today for back pain, diabetes and gastroesophageal reflux.  Diagnoses and all orders for this visit:  Essential hypertension- Her blood pressure is adequately well controlled. -     Basic metabolic panel; Future -     Urinalysis, Routine w reflex microscopic; Future  Diabetes mellitus with complication ([place])- Based on a prescription refills she is not compliant with metformin and the SGLT2 inhibitor.  I have asked her to restart Segluromet. -     Basic metabolic panel; Future -     Hemoglobin A1c; Future -     Microalbumin / creatinine urine ratio; Future -     HM Diabetes Foot Exam -     Ertugliflozin-metFORMIN HCl (SEGLUROMET) 2.04-999 MG TABS; Take 1 tablet by mouth 2 (two) times daily.  Thiamine deficiency- I will monitor her H&H and her thiamine level. -     CBC with Differential/Platelet; Future -     Vitamin B1; Future  Lumbar radiculitis - I have asked her to undergo an MRI to see if there is spinal stenosis, degenerative disc disease, a tumor, or nerve impingement. -     MR Lumbar Spine Wo Contrast; Future  Vaginal discharge -     Wet prep, genital; Future  Cervical cancer screening -     Cytology - PAP; Future  Gastroesophageal reflux disease with esophagitis- I think the blood in her stool is from hemorrhoidal  bleeding.  She is not anemic.  I have asked her to restart a PPI but will dose it once a day so her insurance will pay for it. -     pantoprazole (PROTONIX) 40 MG tablet; Take 1 tablet (40 mg total) by mouth daily.  Uncontrolled type 2 diabetes mellitus with hyperglycemia (HCC) -      Ertugliflozin-metFORMIN HCl (SEGLUROMET) 2.04-999 MG TABS; Take 1 tablet by mouth 2 (two) times daily.   I have discontinued [employer] promethazine, Ertugliflozin-metFORMIN HCl, pantoprazole, doxycycline, and Promethazine-Codeine. I am also having her start on pantoprazole and Ertugliflozin-metFORMIN HCl. Additionally, I am having her maintain her docusate sodium, albuterol, Adjustable Lancing Device, AQUALANCE LANCETS 30G, Aclidinium Bromide, thiamine, BAYER CONTOUR TEST, Insulin Pen Needle, Diclofenac Sodium, insulin detemir, Erenumab-aooe, dicyclomine, clonazePAM, traZODone, olmesartan, aspirin EC, rizatriptan, amitriptyline, BOTOX, rosuvastatin, and venlafaxine.  Meds ordered this encounter  Medications  . pantoprazole (PROTONIX) 40 MG tablet    Sig: Take 1 tablet (40 mg total) by mouth daily.    Dispense:  90 tablet    Refill:  1  . Ertugliflozin-metFORMIN HCl (SEGLUROMET) 2.04-999 MG TABS    Sig: Take 1 tablet by mouth 2 (two) times daily.    Dispense:  180 tablet    Refill:  1     Follow-up: Return in about 3 months (around 05/11/2019).  Scarlette Calico, MD

## 2019-02-10 NOTE — Patient Instructions (Signed)

## 2019-02-11 MED ORDER — PROMETHAZINE HCL 25 MG PO TABS: 25.0000 mg | ORAL_TABLET | Freq: Four times a day (QID) | ORAL | 1 refills | Status: DC | PRN

## 2019-02-12 LAB — CYTOLOGY - PAP
Adequacy: ABSENT
Chlamydia: NEGATIVE
Diagnosis: NEGATIVE
HPV: NOT DETECTED
Neisseria Gonorrhea: NEGATIVE

## 2019-02-13 ENCOUNTER — Encounter: Payer: Self-pay | Admitting: Internal Medicine

## 2019-02-13 ENCOUNTER — Other Ambulatory Visit: Payer: Self-pay | Admitting: Internal Medicine

## 2019-02-13 LAB — HM PAP SMEAR

## 2019-02-13 LAB — VITAMIN B1: Vitamin B1 (Thiamine): 99 nmol/L — ABNORMAL HIGH (ref 8–30)

## 2019-02-16 ENCOUNTER — Other Ambulatory Visit: Payer: Self-pay | Admitting: Internal Medicine

## 2019-02-16 DIAGNOSIS — M5416 Radiculopathy, lumbar region: Secondary | ICD-10-CM

## 2019-02-17 ENCOUNTER — Emergency Department (HOSPITAL_COMMUNITY): Payer: PRIVATE HEALTH INSURANCE

## 2019-02-17 ENCOUNTER — Observation Stay (HOSPITAL_COMMUNITY)
Admission: EM | Admit: 2019-02-17 | Discharge: 2019-02-18 | Disposition: A | Discharge status: Home / Self Care | Payer: PRIVATE HEALTH INSURANCE | Attending: Family Medicine | Admitting: Family Medicine

## 2019-02-17 ENCOUNTER — Other Ambulatory Visit: Payer: Self-pay

## 2019-02-17 ENCOUNTER — Ambulatory Visit: Payer: Self-pay

## 2019-02-17 ENCOUNTER — Encounter (HOSPITAL_COMMUNITY): Payer: Self-pay | Admitting: Emergency Medicine

## 2019-02-17 DIAGNOSIS — Z7982 Long term (current) use of aspirin: Secondary | ICD-10-CM | POA: Insufficient documentation

## 2019-02-17 DIAGNOSIS — E119 Type 2 diabetes mellitus without complications: Secondary | ICD-10-CM | POA: Insufficient documentation

## 2019-02-17 DIAGNOSIS — F17210 Nicotine dependence, cigarettes, uncomplicated: Secondary | ICD-10-CM | POA: Insufficient documentation

## 2019-02-17 DIAGNOSIS — Z79899 Other long term (current) drug therapy: Secondary | ICD-10-CM | POA: Insufficient documentation

## 2019-02-17 DIAGNOSIS — R079 Chest pain, unspecified: Principal | ICD-10-CM | POA: Diagnosis present

## 2019-02-17 DIAGNOSIS — F411 Generalized anxiety disorder: Secondary | ICD-10-CM | POA: Diagnosis present

## 2019-02-17 DIAGNOSIS — E118 Type 2 diabetes mellitus with unspecified complications: Secondary | ICD-10-CM | POA: Diagnosis present

## 2019-02-17 DIAGNOSIS — I10 Essential (primary) hypertension: Secondary | ICD-10-CM | POA: Diagnosis present

## 2019-02-17 DIAGNOSIS — J42 Unspecified chronic bronchitis: Secondary | ICD-10-CM | POA: Insufficient documentation

## 2019-02-17 DIAGNOSIS — J410 Simple chronic bronchitis: Secondary | ICD-10-CM | POA: Diagnosis not present

## 2019-02-17 DIAGNOSIS — Z794 Long term (current) use of insulin: Secondary | ICD-10-CM | POA: Insufficient documentation

## 2019-02-17 DIAGNOSIS — R9389 Abnormal findings on diagnostic imaging of other specified body structures: Secondary | ICD-10-CM

## 2019-02-17 LAB — I-STAT BETA HCG BLOOD, ED (MC, WL, AP ONLY): I-stat hCG, quantitative: <5 m[IU]/mL

## 2019-02-17 LAB — CBC
HCT: 43.2 % (ref 36.0–46.0)
Hemoglobin: 13.9 g/dL (ref 12.0–15.0)
MCH: 30.2 pg (ref 26.0–34.0)
MCHC: 32.2 g/dL (ref 30.0–36.0)
MCV: 93.9 fL (ref 80.0–100.0)
Platelets: 376 10*3/uL (ref 150–400)
RBC: 4.6 MIL/uL (ref 3.87–5.11)
RDW: 14.8 % (ref 11.5–15.5)
WBC: 8.3 10*3/uL (ref 4.0–10.5)
nRBC: 0 % (ref 0.0–0.2)

## 2019-02-17 LAB — D-DIMER, QUANTITATIVE: D-Dimer, Quant: 0.37 ug{FEU}/mL (ref 0.00–0.50)

## 2019-02-17 LAB — BASIC METABOLIC PANEL
Anion gap: 8 (ref 5–15)
BUN: 12 mg/dL (ref 6–20)
CO2: 23 mmol/L (ref 22–32)
Calcium: 8.9 mg/dL (ref 8.9–10.3)
Chloride: 105 mmol/L (ref 98–111)
Creatinine, Ser: 0.91 mg/dL (ref 0.44–1.00)
GFR calc Af Amer: >60 mL/min (ref >60)
GFR calc non Af Amer: >60 mL/min (ref >60)
GLUCOSE: 161 mg/dL — AB (ref 70–99)
Potassium: 3.8 mmol/L (ref 3.5–5.1)
Sodium: 136 mmol/L (ref 135–145)

## 2019-02-17 LAB — TROPONIN I: Troponin I: <0.03 ng/mL (ref <0.03)

## 2019-02-17 LAB — GLUCOSE, CAPILLARY: Glucose-Capillary: 153 mg/dL — ABNORMAL HIGH (ref 70–99)

## 2019-02-17 LAB — I-STAT TROPONIN, ED: Troponin i, poc: 0.01 ng/mL (ref 0.00–0.08)

## 2019-02-17 MED ORDER — ASPIRIN 81 MG PO CHEW
324.0000 mg | CHEWABLE_TABLET | Freq: Once | ORAL | Status: AC
  Administered 2019-02-17: 324 mg via ORAL
  Filled 2019-02-17: qty 4

## 2019-02-17 MED ORDER — DOCUSATE SODIUM 100 MG PO CAPS
100.0000 mg | ORAL_CAPSULE | Freq: Every day | ORAL | Status: DC
  Administered 2019-02-18: 100 mg via ORAL
  Filled 2019-02-17: qty 1

## 2019-02-17 MED ORDER — SODIUM CHLORIDE 0.9 % IV BOLUS
500.0000 mL | Freq: Once | INTRAVENOUS | Status: AC
  Administered 2019-02-17: 500 mL via INTRAVENOUS

## 2019-02-17 MED ORDER — SODIUM CHLORIDE 0.9% FLUSH
3.0000 mL | Freq: Two times a day (BID) | INTRAVENOUS | Status: DC
  Administered 2019-02-18: 3 mL via INTRAVENOUS

## 2019-02-17 MED ORDER — SODIUM CHLORIDE 0.9% FLUSH
3.0000 mL | Freq: Once | INTRAVENOUS | Status: AC
  Administered 2019-02-17: 3 mL via INTRAVENOUS

## 2019-02-17 MED ORDER — TRAZODONE HCL 50 MG PO TABS
150.0000 mg | ORAL_TABLET | Freq: Every day | ORAL | Status: DC
  Administered 2019-02-18: 150 mg via ORAL
  Filled 2019-02-17: qty 3

## 2019-02-17 MED ORDER — ENOXAPARIN SODIUM 40 MG/0.4ML ~~LOC~~ SOLN
40.0000 mg | Freq: Every day | SUBCUTANEOUS | Status: DC
  Administered 2019-02-18: 40 mg via SUBCUTANEOUS
  Filled 2019-02-17: qty 0.4

## 2019-02-17 MED ORDER — MORPHINE SULFATE (PF) 2 MG/ML IV SOLN
2.0000 mg | INTRAVENOUS | Status: DC | PRN
  Administered 2019-02-17 – 2019-02-18 (×3): 2 mg via INTRAVENOUS
  Filled 2019-02-17 (×3): qty 1

## 2019-02-17 MED ORDER — ONDANSETRON HCL 4 MG/2ML IJ SOLN: 4.0000 mg | Freq: Four times a day (QID) | INTRAMUSCULAR | Status: DC | PRN

## 2019-02-17 MED ORDER — ACETAMINOPHEN 325 MG PO TABS: 650.0000 mg | ORAL_TABLET | ORAL | Status: DC | PRN

## 2019-02-17 MED ORDER — SODIUM CHLORIDE 0.9 % IV SOLN: 250.0000 mL | INTRAVENOUS | Status: DC | PRN

## 2019-02-17 MED ORDER — ALBUTEROL SULFATE (2.5 MG/3ML) 0.083% IN NEBU: 3.0000 mL | INHALATION_SOLUTION | Freq: Four times a day (QID) | RESPIRATORY_TRACT | Status: DC | PRN

## 2019-02-17 MED ORDER — CLONAZEPAM 0.5 MG PO TABS: 1.0000 mg | ORAL_TABLET | Freq: Two times a day (BID) | ORAL | Status: DC | PRN

## 2019-02-17 MED ORDER — INSULIN ASPART 100 UNIT/ML ~~LOC~~ SOLN: 0.0000 [IU] | Freq: Every day | SUBCUTANEOUS | Status: DC

## 2019-02-17 MED ORDER — PANTOPRAZOLE SODIUM 40 MG PO TBEC
40.0000 mg | DELAYED_RELEASE_TABLET | Freq: Every day | ORAL | Status: DC
  Administered 2019-02-18: 40 mg via ORAL
  Filled 2019-02-17: qty 1

## 2019-02-17 MED ORDER — DICYCLOMINE HCL 10 MG PO CAPS: 10.0000 mg | ORAL_CAPSULE | Freq: Two times a day (BID) | ORAL | Status: DC | PRN

## 2019-02-17 MED ORDER — VENLAFAXINE HCL 37.5 MG PO TABS
37.5000 mg | ORAL_TABLET | Freq: Two times a day (BID) | ORAL | Status: DC
  Administered 2019-02-18 (×2): 37.5 mg via ORAL
  Filled 2019-02-17 (×3): qty 1

## 2019-02-17 MED ORDER — ASPIRIN EC 81 MG PO TBEC
81.0000 mg | DELAYED_RELEASE_TABLET | Freq: Every day | ORAL | Status: DC
  Administered 2019-02-18: 81 mg via ORAL
  Filled 2019-02-17: qty 1

## 2019-02-17 MED ORDER — ROSUVASTATIN CALCIUM 20 MG PO TABS: 20.0000 mg | ORAL_TABLET | Freq: Every day | ORAL | Status: DC

## 2019-02-17 MED ORDER — INSULIN DETEMIR 100 UNIT/ML ~~LOC~~ SOLN
20.0000 [IU] | Freq: Every day | SUBCUTANEOUS | Status: DC
  Administered 2019-02-18: 20 [IU] via SUBCUTANEOUS
  Filled 2019-02-17: qty 0.2

## 2019-02-17 MED ORDER — AMITRIPTYLINE HCL 25 MG PO TABS
75.0000 mg | ORAL_TABLET | Freq: Every day | ORAL | Status: DC
  Administered 2019-02-18: 75 mg via ORAL
  Filled 2019-02-17: qty 3

## 2019-02-17 MED ORDER — NITROGLYCERIN 0.4 MG SL SUBL
0.4000 mg | SUBLINGUAL_TABLET | SUBLINGUAL | Status: DC | PRN
  Filled 2019-02-17: qty 1

## 2019-02-17 MED ORDER — INSULIN ASPART 100 UNIT/ML ~~LOC~~ SOLN
0.0000 [IU] | Freq: Three times a day (TID) | SUBCUTANEOUS | Status: DC
  Administered 2019-02-18: 1 [IU] via SUBCUTANEOUS

## 2019-02-17 MED ORDER — SODIUM CHLORIDE 0.9 % IV BOLUS
1000.0000 mL | Freq: Once | INTRAVENOUS | Status: AC
  Administered 2019-02-17: 1000 mL via INTRAVENOUS

## 2019-02-17 MED ORDER — IRBESARTAN 300 MG PO TABS
300.0000 mg | ORAL_TABLET | Freq: Every day | ORAL | Status: DC
  Administered 2019-02-18: 300 mg via ORAL
  Filled 2019-02-17: qty 1
  Filled 2019-02-17: qty 2

## 2019-02-17 MED ORDER — SODIUM CHLORIDE 0.9% FLUSH: 3.0000 mL | INTRAVENOUS | Status: DC | PRN

## 2019-02-17 NOTE — ED Triage Notes (Signed)
Pt c/o central chest pain that radiates to the left arm that started at 1330 today. Also c/o nausea and shortness of breath.

## 2019-02-17 NOTE — ED Notes (Signed)
Pt's sps. Sent to Pt's Rm

## 2019-02-17 NOTE — H&P (Signed)
History and Physical    Ashley [name]:[id number] DOB: [birth year] DOA: 02/17/2019  PCP: Janith Lima, MD   Patient coming from: Home   Chief Complaint: Chest pain   HPI: Ashley [name] is a 53 y.o. female with medical history significant for hypertension, diabetes, tobacco abuse, chronic migraines, anxiety, insomnia, and COPD, now presenting to the emergency department for evaluation of chest pain.  Patient reports that she developed acute onset chest pain early this afternoon.  Patient had been in her usual state of health, was having an uneventful day, and was sitting down smoking a cigarette when she developed acute onset of chest pain at approximately 1:30 PM.  She describes this as moderate in intensity, originating from the central chest and radiating through the left arm, associated with mild nausea and mild shortness of breath, and without any alleviating or exacerbating factors.  She has never experienced similar symptoms.  She reports a mild chronic cough that is unchanged, reports some mild chronic ankle edema, but no calf tenderness.  She denies history of VTE.  No recent fevers or chills, no abdominal pain, no nausea, vomiting, or diarrhea.  ED Course: Upon arrival to the ED, patient is found to be afebrile, saturating well on room air, and with vitals otherwise normal.  EKG features a sinus rhythm with nonspecific ST-T abnormalities.  Chest x-ray is notable for linear opacifications favoring scarring or atelectasis.  Chemistry panel and CBC are unremarkable, d-dimer is negative, and troponin is normal.  Patient was given a liter of normal saline and 324 mg of aspirin in the ED.  She will be observed for further evaluation and management.  Review of Systems:  All other systems reviewed and apart from HPI, are negative.  Past Medical History:  Diagnosis Date  . Anxiety   . Bronchitis    uses inhaler if needed for bronchitis, lasted used -2014  . Cataract    bilateral  . Chronic  headaches    migraines - in past, uses Phenergan for nausea   . Deficiency anemia 12/30/2017  . Diabetes mellitus without complication ([place])    taken off Metformin since 04/2014, HgbA1C - normal, will follow up with PCP- Dr. Deborra Medina, 07/2014  . GERD (gastroesophageal reflux disease)   . H/O exercise stress test 2011   done at [facility]- told that it was WNL, done due to pt. having panic attacks   . History of blood transfusion 08-21-1966   at birth in [place], [place], unsure number of units  . Poor dentition    very poor oral health   . SVD (spontaneous vaginal delivery)    x 3  . Tobacco abuse     Past Surgical History:  Procedure Laterality Date  . ABDOMINAL HYSTERECTOMY    . APPENDECTOMY  1988  . CARDIAC CATHETERIZATION N/A 08/17/2016   Procedure: Left Heart Cath and Coronary Angiography;  Surgeon: Burnell Blanks, MD;  Location: [facility];  Service: Cardiovascular;  Laterality: N/A;  . CHOLECYSTECTOMY N/A 06/07/2014   Procedure: LAPAROSCOPIC CHOLECYSTECTOMY;  Surgeon: Ralene Ok, MD;  Location: [facility];  Service: General;  Laterality: N/A;  . CHOLECYSTECTOMY  June 07 2014  . COLONOSCOPY  02/16/2014   normal   . ESOPHAGOGASTRODUODENOSCOPY  12/28/2013  . KNEE ARTHROSCOPY  1995   left  . LAPAROSCOPIC ASSISTED VAGINAL HYSTERECTOMY N/A 04/15/2014   Procedure: LAPAROSCOPIC ASSISTED VAGINAL HYSTERECTOMY;  Surgeon: Marylynn Pearson, MD;  Location: [facility];  Service: Gynecology;  Laterality: N/A;  .  LAPAROSCOPIC BILATERAL SALPINGO OOPHERECTOMY Bilateral 04/15/2014   Procedure: LAPAROSCOPIC BILATERAL SALPINGO OOPHORECTOMY;  Surgeon: Marylynn Pearson, MD;  Location: [facility];  Service: Gynecology;  Laterality: Bilateral;  . TUBAL LIGATION       reports that she has been smoking cigarettes. She has a 70.00 pack-year smoking history. She has never used smokeless tobacco. She reports that she does not drink alcohol or use drugs.  Allergies  Allergen Reactions  . Penicillins Anaphylaxis  and Swelling    Throat swells Has patient had a PCN reaction causing immediate rash, facial/tongue/throat swelling, SOB or lightheadedness with hypotension: Yes Has patient had a PCN reaction causing severe rash involving mucus membranes or skin necrosis: No Has patient had a PCN reaction that required hospitalization: No Has patient had a PCN reaction occurring within the last 10 years: No If all of the above answers are "NO", then may proceed with Cephalosporin use.   . Topamax [Topiramate] Other (See Comments)    Hands and feet  go numb    Family History  Problem Relation Age of Onset  . Diabetes Mother   . Hyperlipidemia Mother   . Hypertension Mother   . Anxiety disorder Mother   . Depression Mother   . Drug abuse Mother   . Prostate cancer Father   . Alcohol abuse Father   . Lung cancer Maternal Grandfather        smoked  . Emphysema Maternal Grandfather        smoked  . COPD Maternal Grandmother        never smoked   . Dementia Maternal Grandmother   . Thyroid cancer Paternal Aunt   . Colon cancer Neg Hx   . Rectal cancer Neg Hx   . Stomach cancer Neg Hx   . Migraines Neg Hx      Prior to Admission medications   Medication Sig Start Date End Date Taking? Authorizing Provider  amitriptyline (ELAVIL) 50 MG tablet Take 1.5 tablets (75 mg total) by mouth at bedtime. 11/12/18  Yes Janith Lima, MD  aspirin EC 81 MG tablet Take 1 tablet (81 mg total) by mouth daily. 10/20/18  Yes Janith Lima, MD  cholecalciferol (VITAMIN D3) 25 MCG (1000 UT) tablet Take 2,000 Units by mouth daily.   Yes [provider]  clonazePAM (KLONOPIN) 1 MG tablet Take 1 tablet (1 mg total) by mouth 2 (two) times daily as needed for anxiety. 10/20/18  Yes Janith Lima, MD  dicyclomine (BENTYL) 10 MG capsule TAKE 1 CAPSULE BY MOUTH TWICE DAILY AS NEEDED FOR  CRAMPING  AND  ABDIMINAL  PAIN Patient taking differently: Take 10 mg by mouth 2 (two) times daily as needed (Cramping and  abdominal pain).  10/01/18  Yes Janith Lima, MD  docusate sodium (COLACE) 100 MG capsule Take 100 mg by mouth at bedtime.   Yes [provider]  Ertugliflozin-metFORMIN HCl (SEGLUROMET) 2.04-999 MG TABS Take 1 tablet by mouth 2 (two) times daily. 02/10/19  Yes Janith Lima, MD  insulin detemir (LEVEMIR) 100 UNIT/ML injection Inject 0.3 mLs (30 Units total) into the skin daily. Patient taking differently: Inject 35 Units into the skin daily.  06/16/18  Yes Janith Lima, MD  olmesartan (BENICAR) 40 MG tablet Take 1 tablet (40 mg total) by mouth daily. 10/20/18  Yes Janith Lima, MD  pantoprazole (PROTONIX) 40 MG tablet Take 1 tablet (40 mg total) by mouth daily. 02/10/19  Yes Janith Lima, MD  promethazine [facility])  25 MG tablet Take 1 tablet (25 mg total) by mouth every 6 (six) hours as needed for nausea or vomiting. 02/11/19  Yes Janith Lima, MD  rosuvastatin (CRESTOR) 20 MG tablet TAKE 1 TABLET BY MOUTH DAILY Patient taking differently: Take 20 mg by mouth daily.  01/14/19  Yes Janith Lima, MD  traZODone (DESYREL) 150 MG tablet Take 1 tablet (150 mg total) by mouth at bedtime. 10/20/18  Yes Janith Lima, MD  venlafaxine (EFFEXOR) 37.5 MG tablet TAKE 1 TABLET BY MOUTH TWICE DAILY Patient taking differently: Take 37.5 mg by mouth 2 (two) times daily.  01/26/19  Yes Janith Lima, MD  albuterol (PROVENTIL HFA;VENTOLIN HFA) 108 (90 Base) MCG/ACT inhaler Inhale 2 puffs into the lungs every 6 (six) hours as needed for wheezing or shortness of breath.    [provider]  AQUALANCE LANCETS 30G MISC  11/27/17   [provider]  BAYER CONTOUR TEST test strip Testing for up to tid 03/12/18   Janith Lima, MD  BOTOX 200 units SOLR INJECT 155 UNITS IN THE MUSCLE INTO HEAD AND NECK MUSCLES EVERY 3 MONTHS BY PROVIDER 01/01/19   Melvenia Beam, MD  Erenumab-aooe (AIMOVIG) 140 MG/ML SOAJ Inject 140 mg into the skin every 30 (thirty) days. 07/02/18   Melvenia Beam, MD  Insulin Pen Needle 32G X 6 MM MISC Use daily as directed 05/12/18   Marrian Salvage, FNP  Lancet Devices (ADJUSTABLE LANCING DEVICE) MISC Up to tid testing 09/19/17   [provider]  rizatriptan (MAXALT-MLT) 10 MG disintegrating tablet Take 1 tablet (10 mg total) by mouth as needed for migraine. May repeat in 2 hours if needed. Do not take more than 3 tablets in a week. 10/23/18   Janith Lima, MD    Physical Exam: Vitals:   02/17/19 1605 02/17/19 1734  BP: (!) 103/59 (!) 103/55  Pulse: 78 75  Resp: 16 12  Temp: 97.9 F (36.6 C)   TempSrc: Oral   SpO2: 97% 96%    Constitutional: NAD, calm  Eyes: PERTLA, lids and conjunctivae normal ENMT: Mucous membranes are moist. Posterior pharynx clear of any exudate or lesions.   Neck: normal, supple, no masses, no thyromegaly Respiratory: clear to auscultation bilaterally, no wheezing, no crackles. No accessory muscle use.  Cardiovascular: S1 & S2 heard, regular rate and rhythm. No extremity edema.   Abdomen: No distension, no tenderness, soft. Bowel sounds normal.  Musculoskeletal: no clubbing / cyanosis. No joint deformity upper and lower extremities.    Skin: no significant rashes, lesions, ulcers. Warm, dry, well-perfused. Neurologic: CN 2-12 grossly intact. Sensation intact. Strength 5/5 in all 4 limbs.  Psychiatric: Alert and oriented x 3. Normal mood and affect.    Labs on Admission: I have personally reviewed following labs and imaging studies  CBC: Recent Labs  Lab 02/17/19 1558  WBC 8.3  HGB 13.9  HCT 43.2  MCV 93.9  PLT 762   Basic Metabolic Panel: Recent Labs  Lab 02/17/19 1558  NA 136  K 3.8  CL 105  CO2 23  GLUCOSE 161*  BUN 12  CREATININE 0.91  CALCIUM 8.9   GFR: Estimated Creatinine Clearance: 70 mL/min (by C-G formula based on SCr of 0.91 mg/dL). Liver Function Tests: No results for input(s): AST, ALT, ALKPHOS, BILITOT, PROT, ALBUMIN in the last 168 hours. No results for  input(s): LIPASE, AMYLASE in the last 168 hours. No results for input(s): AMMONIA in the last 168  hours. Coagulation Profile: No results for input(s): INR, PROTIME in the last 168 hours. Cardiac Enzymes: No results for input(s): CKTOTAL, CKMB, CKMBINDEX, TROPONINI in the last 168 hours. BNP (last 3 results) Recent Labs    10/20/18 1042  PROBNP 11.0   HbA1C: No results for input(s): HGBA1C in the last 72 hours. CBG: No results for input(s): GLUCAP in the last 168 hours. Lipid Profile: No results for input(s): CHOL, HDL, LDLCALC, TRIG, CHOLHDL, LDLDIRECT in the last 72 hours. Thyroid Function Tests: No results for input(s): TSH, T4TOTAL, FREET4, T3FREE, THYROIDAB in the last 72 hours. Anemia Panel: No results for input(s): VITAMINB12, FOLATE, FERRITIN, TIBC, IRON, RETICCTPCT in the last 72 hours. Urine analysis:    Component Value Date/Time   COLORURINE YELLOW 02/10/2019 [place] 02/10/2019 0959   LABSPEC <=1.005 (A) 02/10/2019 0959   PHURINE 6.5 02/10/2019 0959   GLUCOSEU NEGATIVE 02/10/2019 0959   HGBUR NEGATIVE 02/10/2019 0959   BILIRUBINUR NEGATIVE 02/10/2019 0959   BILIRUBINUR negative 10/29/2016 1254   KETONESUR NEGATIVE 02/10/2019 0959   PROTEINUR NEGATIVE 09/09/2017 1331   UROBILINOGEN 0.2 02/10/2019 0959   NITRITE NEGATIVE 02/10/2019 0959   LEUKOCYTESUR NEGATIVE 02/10/2019 0959   Sepsis Labs: @LABRCNTIP (procalcitonin:4,lacticidven:4) ) Recent Results (from the past 240 hour(s))  Wet prep, genital     Status: Abnormal   Collection Time: 02/10/19  9:50 AM  Result Value Ref Range Status   WBC, Wet Prep HPF POC Many(17-25/hpf) (A) None Final   Bacteria Too Numerous to Count(>25/hpf) (A) None Final   Trich, Wet Prep None Seen (A) None Final   Yeast Wet Prep HPF POC None Seen (A) None Final   Clue Cells Wet Prep HPF POC None Seen (A) None;Present Final     Radiological Exams on Admission: Dg Chest 2 View  Result Date: 02/17/2019 CLINICAL DATA:   Chest pain radiating to the left arm. Nausea and shortness of breath. EXAM: CHEST - 2 VIEW COMPARISON:  12/30/2017 FINDINGS: Linear densities compatible with scarring in the right middle lobe and lingula, potentially with scarring or subsegmental atelectasis increased in the lingula and left lower lobe. The lungs appear otherwise clear. Cardiac and mediastinal margins appear normal. No pleural effusion is identified. No appreciable bony abnormality. IMPRESSION: 1. Linear opacities favoring scarring or atelectasis in the lingula, right middle lobe, and questionably in the left lower lobe. The right middle lobe scarring is stable but the left basilar linear opacities are mildly increased from 12/30/2017. Electronically Signed   By: Van Clines M.D.   On: 02/17/2019 17:13    EKG: Independently reviewed. Sinus rhythm, non-specific ST-T abnormality.   Assessment/Plan   1. Chest pain  - Presents with chest pain, onset at rest, constant since ~13:30 with no alleviating/exacerbating factors  - She had cardiac cath in August 2017 with no CAD - Troponin is 0.01, d-dimer 0.37, EKG with non-specific changes, CXR with likely atelectasis or scarring  - She was treated with ASA 324 mg in ED  - Continue cardiac monitoring, obtain serial troponin measurements, repeat EKG, continue ASA, statin, and ARB    2. Hypertension  - BP low-normal in ED  - Continue ARB as tolerated   3. Insulin-dependent DM  - A1c was 7.9% this month  - managed at home with ertugliflozin, metformin, and Levemir 35 units qD   - Check CBG's, continue Levemir, and use a SSI with Novolog while in hospital    4. COPD  - Stable, continue albuterol as needed  5. Anxiety; insomnia  - Continue Effexor, trazodone, Elavil, and Klonopin     DVT prophylaxis: Lovenox  Code Status: Full  Family Communication: Husband updated at bedside  Consults called: None Admission status: Observation     Vianne Bulls, MD [facility] Pager [phone]  If 7PM-7AM, please contact night-coverage [email or web address] Password [facility]  02/17/2019, 7:29 PM

## 2019-02-17 NOTE — ED Notes (Signed)
Per Admitting patient not given Nitro due to soft pressure; pt is asymptomatic at this time-Monique,RN

## 2019-02-17 NOTE — Telephone Encounter (Signed)
Patient called and says she's been having CP with dizziness since 1300 today. She says the pain is in the center of her chest going across the left side into her shoulder down to the elbow. She says she's so dizzy that when she gets up to go to the bathroom and back from the bathroom, she feels like she's going to pass out. She says the pain is a 6. She says she's also having nausea and a cough. According to protocol, go to ED, care advice given, patient verbalized understanding and says she will go to [facility].  Reason for Disposition . [1] Intermittent  chest pain or "angina" AND [2] increasing in severity or frequency  (Exception: pains lasting a few seconds)  Answer Assessment - Initial Assessment Questions 1. LOCATION: "Where does it hurt?"       Center of chest 2. RADIATION: "Does the pain go anywhere else?" (e.g., into neck, jaw, arms, back)     Over to the left into the shoulder down to the elbow 3. ONSET: "When did the chest pain begin?" (Minutes, hours or days)      1300 today 4. PATTERN "Does the pain come and go, or has it been constant since it started?"  "Does it get worse with exertion?"      Constant 5. DURATION: "How long does it last" (e.g., seconds, minutes, hours)     Since 1300 6. SEVERITY: "How bad is the pain?"  (e.g., Scale 1-10; mild, moderate, or severe)    - MILD (1-3): doesn't interfere with normal activities     - MODERATE (4-7): interferes with normal activities or awakens from sleep    - SEVERE (8-10): excruciating pain, unable to do any normal activities       6 7. CARDIAC RISK FACTORS: "Do you have any history of heart problems or risk factors for heart disease?" (e.g., prior heart attack, angina; high blood pressure, diabetes, being overweight, high cholesterol, smoking, or strong family history of heart disease)     HTN, diabetes, overweight, high cholesterol, smoker, both paternal grandparents heart disease 8. PULMONARY RISK FACTORS: "Do you have any history of  lung disease?"  (e.g., blood clots in lung, asthma, emphysema, birth control pills)     COPD 9. CAUSE: "What do you think is causing the chest pain?"     I don't know 10. OTHER SYMPTOMS: "Do you have any other symptoms?" (e.g., dizziness, nausea, vomiting, sweating, fever, difficulty breathing, cough)       Dizziness, nausea, cough 11. PREGNANCY: "Is there any chance you are pregnant?" "When was your last menstrual period?"       No  Protocols used: CHEST PAIN-A-AH

## 2019-02-17 NOTE — ED Notes (Signed)
Dr.Oyp notified of continued chest pain at 8/10; bed assignment will be changed to Step down;-Monique,RN

## 2019-02-17 NOTE — ED Provider Notes (Signed)
[facility] EMERGENCY DEPARTMENT Provider Note   CSN: [id number] Arrival date & time: 02/17/19  1541    History   Chief Complaint Chief Complaint  Patient presents with  . Chest Pain    HPI Ashley [name] is a 53 y.o. female with history of diabetes, hypertension, hyperlipidemia, family history of heart disease and current smoking presents for chest pain.  Patient reports that she was sitting on her couch at 1:30 PM today smoking a cigarette when she had a sudden onset of chest pain.  She describes her pain as a severe in intensity central squeezing pressure that radiates to her left arm that has been constant since onset.  Patient states that her pain is worse with deep breathing and is without alleviating factor.  Patient has not taken any medication for her pain prior to arrival.  Patient denies having similar pain in the past.  Patient denies recent fever or illness.  She states that she has a chronic cough productive with mild yellow sputum however is without change in sputum production and denies hemoptysis.    HPI  Past Medical History:  Diagnosis Date  . Anxiety   . Bronchitis    uses inhaler if needed for bronchitis, lasted used -2014  . Cataract    bilateral  . Chronic headaches    migraines - in past, uses Phenergan for nausea   . Deficiency anemia 12/30/2017  . Diabetes mellitus without complication ([place])    taken off Metformin since 04/2014, HgbA1C - normal, will follow up with PCP- Dr. Deborra Medina, 07/2014  . GERD (gastroesophageal reflux disease)   . H/O exercise stress test 2011   done at [facility]- told that it was WNL, done due to pt. having panic attacks   . History of blood transfusion 03-Feb-1966   at birth in [place], [place], unsure number of units  . Poor dentition    very poor oral health   . SVD (spontaneous vaginal delivery)    x 3  . Tobacco abuse     Patient Active Problem List   Diagnosis Date Noted  . Chest pain 02/17/2019  . Lumbar  radiculitis 02/10/2019  . Gastroesophageal reflux disease with esophagitis 02/10/2019  . Essential hypertension 10/20/2018  . Chronic migraine without aura, with intractable migraine, so stated, with status migrainosus 07/02/2018  . Thiamine deficiency 01/05/2018  . Claudication of both lower extremities ([place]) 12/30/2017  . Constipation 09/12/2017  . Moderate episode of recurrent major depressive disorder ([place]) 05/16/2017  . Numbness in feet 04/25/2017  . Diabetes mellitus with complication ([place])   . 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 Tobacco abuse 01/08/2017  . Chronic bronchitis ([place]) 10/20/2015  . Dyslipidemia, goal LDL below 70 04/12/2015  . Migraine with aura, intractable 08/12/2014  . Routine general medical examination at a health care facility 05/24/2011  . Anxiety state 09/30/2009  . INSOMNIA, CHRONIC 09/30/2009    Past Surgical History:  Procedure Laterality Date  . ABDOMINAL HYSTERECTOMY    . APPENDECTOMY  1988  . CARDIAC CATHETERIZATION N/A 08/17/2016   Procedure: Left Heart Cath and Coronary Angiography;  Surgeon: Burnell Blanks, MD;  Location: [facility];  Service: Cardiovascular;  Laterality: N/A;  . CHOLECYSTECTOMY N/A 06/07/2014   Procedure: LAPAROSCOPIC CHOLECYSTECTOMY;  Surgeon: Ralene Ok, MD;  Location: [facility];  Service: General;  Laterality: N/A;  . CHOLECYSTECTOMY  June 07 2014  . COLONOSCOPY  02/16/2014   normal   . ESOPHAGOGASTRODUODENOSCOPY  12/28/2013  . KNEE  ARTHROSCOPY  1995   left  . LAPAROSCOPIC ASSISTED VAGINAL HYSTERECTOMY N/A 04/15/2014   Procedure: LAPAROSCOPIC ASSISTED VAGINAL HYSTERECTOMY;  Surgeon: Marylynn Pearson, MD;  Location: [facility];  Service: Gynecology;  Laterality: N/A;  . LAPAROSCOPIC BILATERAL SALPINGO OOPHERECTOMY Bilateral 04/15/2014   Procedure: LAPAROSCOPIC BILATERAL SALPINGO OOPHORECTOMY;  Surgeon: Marylynn Pearson, MD;  Location: [facility];  Service: Gynecology;  Laterality: Bilateral;  . TUBAL LIGATION        OB History   No obstetric history on file.      Home Medications    Prior to Admission medications   Medication Sig Start Date End Date Taking? Authorizing Provider  amitriptyline (ELAVIL) 50 MG tablet Take 1.5 tablets (75 mg total) by mouth at bedtime. 11/12/18  Yes Janith Lima, MD  aspirin EC 81 MG tablet Take 1 tablet (81 mg total) by mouth daily. 10/20/18  Yes Janith Lima, MD  cholecalciferol (VITAMIN D3) 25 MCG (1000 UT) tablet Take 2,000 Units by mouth daily.   Yes [provider]  clonazePAM (KLONOPIN) 1 MG tablet Take 1 tablet (1 mg total) by mouth 2 (two) times daily as needed for anxiety. 10/20/18  Yes Janith Lima, MD  dicyclomine (BENTYL) 10 MG capsule TAKE 1 CAPSULE BY MOUTH TWICE DAILY AS NEEDED FOR  CRAMPING  AND  ABDIMINAL  PAIN Patient taking differently: Take 10 mg by mouth 2 (two) times daily as needed (Cramping and abdominal pain).  10/01/18  Yes Janith Lima, MD  docusate sodium (COLACE) 100 MG capsule Take 100 mg by mouth at bedtime.   Yes [provider]  Ertugliflozin-metFORMIN HCl (SEGLUROMET) 2.04-999 MG TABS Take 1 tablet by mouth 2 (two) times daily. 02/10/19  Yes Janith Lima, MD  insulin detemir (LEVEMIR) 100 UNIT/ML injection Inject 0.3 mLs (30 Units total) into the skin daily. Patient taking differently: Inject 35 Units into the skin daily.  06/16/18  Yes Janith Lima, MD  olmesartan (BENICAR) 40 MG tablet Take 1 tablet (40 mg total) by mouth daily. 10/20/18  Yes Janith Lima, MD  pantoprazole (PROTONIX) 40 MG tablet Take 1 tablet (40 mg total) by mouth daily. 02/10/19  Yes Janith Lima, MD  promethazine (PHENERGAN) 25 MG tablet Take 1 tablet (25 mg total) by mouth every 6 (six) hours as needed for nausea or vomiting. 02/11/19  Yes Janith Lima, MD  rosuvastatin (CRESTOR) 20 MG tablet TAKE 1 TABLET BY MOUTH DAILY Patient taking differently: Take 20 mg by mouth daily.  01/14/19  Yes Janith Lima, MD  traZODone  (DESYREL) 150 MG tablet Take 1 tablet (150 mg total) by mouth at bedtime. 10/20/18  Yes Janith Lima, MD  venlafaxine (EFFEXOR) 37.5 MG tablet TAKE 1 TABLET BY MOUTH TWICE DAILY Patient taking differently: Take 37.5 mg by mouth 2 (two) times daily.  01/26/19  Yes Janith Lima, MD  albuterol (PROVENTIL HFA;VENTOLIN HFA) 108 (90 Base) MCG/ACT inhaler Inhale 2 puffs into the lungs every 6 (six) hours as needed for wheezing or shortness of breath.    [provider]  AQUALANCE LANCETS 30G MISC  11/27/17   [provider]  BAYER CONTOUR TEST test strip Testing for up to tid 03/12/18   Janith Lima, MD  BOTOX 200 units SOLR INJECT 155 UNITS IN THE MUSCLE INTO HEAD AND NECK MUSCLES EVERY 3 MONTHS BY PROVIDER 01/01/19   Melvenia Beam, MD  Erenumab-aooe (AIMOVIG) 140 MG/ML SOAJ Inject 140 mg into the skin every  30 (thirty) days. 07/02/18   Melvenia Beam, MD  Insulin Pen Needle 32G X 6 MM MISC Use daily as directed 05/12/18   Marrian Salvage, FNP  Lancet Devices (ADJUSTABLE LANCING DEVICE) MISC Up to tid testing 09/19/17   [provider]  rizatriptan (MAXALT-MLT) 10 MG disintegrating tablet Take 1 tablet (10 mg total) by mouth as needed for migraine. May repeat in 2 hours if needed. Do not take more than 3 tablets in a week. 10/23/18   Janith Lima, MD    Family History Family History  Problem Relation Age of Onset  . Diabetes Mother   . Hyperlipidemia Mother   . Hypertension Mother   . Anxiety disorder Mother   . Depression Mother   . Drug abuse Mother   . Prostate cancer Father   . Alcohol abuse Father   . Lung cancer Maternal Grandfather        smoked  . Emphysema Maternal Grandfather        smoked  . COPD Maternal Grandmother        never smoked   . Dementia Maternal Grandmother   . Thyroid cancer Paternal Aunt   . Colon cancer Neg Hx   . Rectal cancer Neg Hx   . Stomach cancer Neg Hx   . Migraines Neg Hx     Social History Social History     Tobacco Use  . Smoking status: Current Every Day Smoker    Packs/day: 2.00    Years: 35.00    Pack years: 70.00    Types: Cigarettes  . Smokeless tobacco: Never Used  . Tobacco comment: 08-17-17- pt reports smokes "almost 2 packs a day"  Substance Use Topics  . Alcohol use: No    Alcohol/week: 0.0 standard drinks  . Drug use: No     Allergies   Penicillins and Topamax [topiramate]   Review of Systems Review of Systems  Constitutional: Negative for chills and fever.  Respiratory: Positive for cough (Chronic "smoking cough"). Negative for shortness of breath.   Cardiovascular: Positive for chest pain. Negative for palpitations and leg swelling.  Gastrointestinal: Positive for nausea. Negative for abdominal pain and vomiting.  Musculoskeletal: Negative.  Negative for back pain and neck pain.  Neurological: Negative.  Negative for dizziness, syncope, weakness and headaches.  All other systems reviewed and are negative.  Physical Exam Updated Vital Signs BP (!) 103/55 (BP Location: Right Arm)   Pulse 75   Temp 97.9 F (36.6 C) (Oral)   Resp 12   LMP 02/12/2014   SpO2 96%   Physical Exam Constitutional:      General: She is not in acute distress.    Appearance: Normal appearance. She is well-developed. She is not ill-appearing or diaphoretic.  HENT:     Head: Normocephalic and atraumatic.     Right Ear: External ear normal.     Left Ear: External ear normal.     Nose: Nose normal.  Eyes:     General: Vision grossly intact. Gaze aligned appropriately.     Pupils: Pupils are equal, round, and reactive to light.  Neck:     Musculoskeletal: Normal range of motion.     Trachea: Trachea and phonation normal. No tracheal deviation.  Cardiovascular:     Rate and Rhythm: Normal rate and regular rhythm.     Pulses:          Radial pulses are 2+ on the right side and 2+ on the left side.  Dorsalis pedis pulses are 2+ on the right side and 2+ on the left side.        Posterior tibial pulses are 2+ on the right side and 2+ on the left side.     Heart sounds: Normal heart sounds.  Pulmonary:     Effort: Pulmonary effort is normal. No respiratory distress.     Breath sounds: Wheezing (Mild bilateral expiratory wheezing) present.  Abdominal:     General: There is no distension.     Palpations: Abdomen is soft.     Tenderness: There is no abdominal tenderness. There is no guarding or rebound.  Musculoskeletal: Normal range of motion.  Skin:    General: Skin is warm and dry.     Capillary Refill: Capillary refill takes less than 2 seconds.  Neurological:     Mental Status: She is alert.     GCS: GCS eye subscore is 4. GCS verbal subscore is 5. GCS motor subscore is 6.     Comments: Speech is clear and goal oriented, follows commands Major Cranial nerves without deficit, no facial droop Moves extremities without ataxia, coordination intact  Psychiatric:        Behavior: Behavior normal.    ED Treatments / Results  Labs (all labs ordered are listed, but only abnormal results are displayed) Labs Reviewed  BASIC METABOLIC PANEL - Abnormal; Notable for the following components:      Result Value   Glucose, Bld 161 (*)    All other components within normal limits  CBC  D-DIMER, QUANTITATIVE (NOT AT [facility])  HIV ANTIBODY (ROUTINE TESTING W REFLEX)  BASIC METABOLIC PANEL  TROPONIN I  TROPONIN I  TROPONIN I  CBC  I-STAT TROPONIN, ED  I-STAT BETA HCG BLOOD, ED ([facility], [facility], [facility] ONLY)    EKG EKG Interpretation  Date/Time:  Tuesday February 17 2019 16:00:50 EST Ventricular Rate:  77 PR Interval:  164 QRS Duration: 68 QT Interval:  404 QTC Calculation: 457 R Axis:   36 Text Interpretation:  Normal sinus rhythm Nonspecific ST abnormality Abnormal ECG No significant change since last tracing Confirmed by Deno Etienne [phone]) on 02/17/2019 6:28:21 PM   Radiology Dg Chest 2 View  Result Date: 02/17/2019 CLINICAL DATA:  Chest pain radiating to the left  arm. Nausea and shortness of breath. EXAM: CHEST - 2 VIEW COMPARISON:  12/30/2017 FINDINGS: Linear densities compatible with scarring in the right middle lobe and lingula, potentially with scarring or subsegmental atelectasis increased in the lingula and left lower lobe. The lungs appear otherwise clear. Cardiac and mediastinal margins appear normal. No pleural effusion is identified. No appreciable bony abnormality. IMPRESSION: 1. Linear opacities favoring scarring or atelectasis in the lingula, right middle lobe, and questionably in the left lower lobe. The right middle lobe scarring is stable but the left basilar linear opacities are mildly increased from 12/30/2017. Electronically Signed   By: Van Clines M.D.   On: 02/17/2019 17:13    Procedures Procedures (including critical care time)  Medications Ordered in ED Medications  aspirin EC tablet 81 mg (has no administration in time range)  irbesartan (AVAPRO) tablet 300 mg (has no administration in time range)  rosuvastatin (CRESTOR) tablet 20 mg (has no administration in time range)  amitriptyline (ELAVIL) tablet 75 mg (has no administration in time range)  traZODone (DESYREL) tablet 150 mg (has no administration in time range)  venlafaxine (EFFEXOR) tablet 37.5 mg (has no administration in time range)  dicyclomine (BENTYL) capsule 10 mg (has no  administration in time range)  docusate sodium (COLACE) capsule 100 mg (has no administration in time range)  pantoprazole (PROTONIX) EC tablet 40 mg (has no administration in time range)  clonazePAM (KLONOPIN) tablet 1 mg (has no administration in time range)  albuterol (PROVENTIL HFA;VENTOLIN HFA) 108 (90 Base) MCG/ACT inhaler 2 puff (has no administration in time range)  nitroGLYCERIN (NITROSTAT) SL tablet 0.4 mg (has no administration in time range)  acetaminophen (TYLENOL) tablet 650 mg (has no administration in time range)  ondansetron (ZOFRAN) injection 4 mg (has no administration in  time range)  enoxaparin (LOVENOX) injection 40 mg (has no administration in time range)  sodium chloride flush (NS) 0.9 % injection 3 mL (has no administration in time range)  sodium chloride flush (NS) 0.9 % injection 3 mL (has no administration in time range)  0.9 %  sodium chloride infusion (has no administration in time range)  insulin detemir (LEVEMIR) injection 20 Units (has no administration in time range)  insulin aspart (novoLOG) injection 0-9 Units (has no administration in time range)  insulin aspart (novoLOG) injection 0-5 Units (has no administration in time range)  morphine 2 MG/ML injection 2-4 mg (has no administration in time range)  sodium chloride flush (NS) 0.9 % injection 3 mL (3 mLs Intravenous Given 02/17/19 1824)  sodium chloride 0.9 % bolus 1,000 mL (1,000 mLs Intravenous New Bag/Given 02/17/19 1823)  aspirin chewable tablet 324 mg (324 mg Oral Given 02/17/19 1821)     Initial Impression / Assessment and Plan / ED Course  I have reviewed the triage vital signs and the nursing notes.  Pertinent labs & imaging results that were available during my care of the patient were reviewed by me and considered in my medical decision making (see chart for details).  Clinical Course as of Feb 17 1929  Tue Feb 17, 2019  1924 Discussed with hospitalist; to see patient.   [BM]    Clinical Course User Index [BM] Deliah Boston, PA-C   53 year old female with multiple risk factors including diabetes, smoking, hyperlipidemia and hypertension presents today for chest pain beginning at 1:30 PM.  Initial evaluation reveals mildly uncomfortable patient however no acute distress.  She is still endorsing chest pain has not taken medication prior to arrival.  She is afebrile, not tachycardic, not hypotensive and with SPO2 of 100% on room air. - Initial troponin negative CBC within normal limits Beta-hCG negative BMP nonacute Chest x-ray:  IMPRESSION:  1. Linear opacities favoring  scarring or atelectasis in the lingula,  right middle lobe, and questionably in the left lower lobe. The  right middle lobe scarring is stable but the left basilar linear  opacities are mildly increased from 12/30/2017.  - Aspirin 324 ordered, fluids ordered, d-dimer added.  Based on heart score will plan to admit. Case discussed with Dr. Tyrone Nine. - D-dimer negative - Patient reassessed resting comfortably no acute distress.  Patient is agreeable for admission. - Discussed with hospitalist Dr. Myna Hidalgo who has accepted patient to his service.   Note: Portions of this report may have been transcribed using voice recognition software. Every effort was made to ensure accuracy; however, inadvertent computerized transcription errors may still be present. Final Clinical Impressions(s) / ED Diagnoses   Final diagnoses:  Chest pain, unspecified type    ED Discharge Orders    None       Gari Crown 02/17/19 [place], [place], DO 02/17/19 2216

## 2019-02-17 NOTE — ED Notes (Signed)
Dr Myna Hidalgo  at [facility]

## 2019-02-17 NOTE — ED Notes (Signed)
RN attempted to call report; RN to call back-Monique,RN  

## 2019-02-18 ENCOUNTER — Observation Stay (HOSPITAL_COMMUNITY): Payer: PRIVATE HEALTH INSURANCE

## 2019-02-18 ENCOUNTER — Other Ambulatory Visit: Payer: Self-pay | Admitting: Student

## 2019-02-18 ENCOUNTER — Other Ambulatory Visit: Payer: Self-pay

## 2019-02-18 ENCOUNTER — Observation Stay (HOSPITAL_BASED_OUTPATIENT_CLINIC_OR_DEPARTMENT_OTHER): Payer: PRIVATE HEALTH INSURANCE

## 2019-02-18 DIAGNOSIS — E118 Type 2 diabetes mellitus with unspecified complications: Secondary | ICD-10-CM

## 2019-02-18 DIAGNOSIS — I201 Angina pectoris with documented spasm: Secondary | ICD-10-CM | POA: Diagnosis not present

## 2019-02-18 DIAGNOSIS — R079 Chest pain, unspecified: Secondary | ICD-10-CM | POA: Diagnosis not present

## 2019-02-18 DIAGNOSIS — J42 Unspecified chronic bronchitis: Secondary | ICD-10-CM | POA: Diagnosis not present

## 2019-02-18 DIAGNOSIS — E119 Type 2 diabetes mellitus without complications: Secondary | ICD-10-CM | POA: Diagnosis not present

## 2019-02-18 DIAGNOSIS — I10 Essential (primary) hypertension: Secondary | ICD-10-CM

## 2019-02-18 DIAGNOSIS — R072 Precordial pain: Secondary | ICD-10-CM

## 2019-02-18 DIAGNOSIS — F411 Generalized anxiety disorder: Secondary | ICD-10-CM | POA: Diagnosis not present

## 2019-02-18 LAB — BASIC METABOLIC PANEL
Anion gap: 10 (ref 5–15)
BUN: 10 mg/dL (ref 6–20)
CO2: 21 mmol/L — ABNORMAL LOW (ref 22–32)
CREATININE: 1.05 mg/dL — AB (ref 0.44–1.00)
Calcium: 8.1 mg/dL — ABNORMAL LOW (ref 8.9–10.3)
Chloride: 109 mmol/L (ref 98–111)
GFR calc Af Amer: >60 mL/min (ref >60)
GFR calc non Af Amer: >60 mL/min (ref >60)
Glucose, Bld: 219 mg/dL — ABNORMAL HIGH (ref 70–99)
Potassium: 3.5 mmol/L (ref 3.5–5.1)
Sodium: 140 mmol/L (ref 135–145)

## 2019-02-18 LAB — CBC
HCT: 37.7 % (ref 36.0–46.0)
Hemoglobin: 12.4 g/dL (ref 12.0–15.0)
MCH: 31.1 pg (ref 26.0–34.0)
MCHC: 32.9 g/dL (ref 30.0–36.0)
MCV: 94.5 fL (ref 80.0–100.0)
Platelets: 322 10*3/uL (ref 150–400)
RBC: 3.99 MIL/uL (ref 3.87–5.11)
RDW: 14.8 % (ref 11.5–15.5)
WBC: 6.1 10*3/uL (ref 4.0–10.5)
nRBC: 0 % (ref 0.0–0.2)

## 2019-02-18 LAB — ECHOCARDIOGRAM COMPLETE
Height: 62 in
Weight: 2790.4 oz

## 2019-02-18 LAB — GLUCOSE, CAPILLARY
GLUCOSE-CAPILLARY: 141 mg/dL — AB (ref 70–99)
Glucose-Capillary: 119 mg/dL — ABNORMAL HIGH (ref 70–99)

## 2019-02-18 LAB — TROPONIN I: Troponin I: <0.03 ng/mL (ref <0.03)

## 2019-02-18 LAB — HIV ANTIBODY (ROUTINE TESTING W REFLEX): HIV Screen 4th Generation wRfx: NONREACTIVE

## 2019-02-18 MED ORDER — OLMESARTAN MEDOXOMIL 20 MG PO TABS: 20.0000 mg | ORAL_TABLET | Freq: Every day | ORAL | 2 refills | Status: DC

## 2019-02-18 MED ORDER — NITROGLYCERIN 0.4 MG SL SUBL: 0.4000 mg | SUBLINGUAL_TABLET | SUBLINGUAL | 5 refills | Status: DC | PRN

## 2019-02-18 NOTE — Progress Notes (Signed)
Patient still having soft pressures even after a 500 ml bolus that was given in ED.  I will keep monitoring patient.

## 2019-02-18 NOTE — Progress Notes (Addendum)
Cardiology Consult    Patient ID: Ashley [name] MRN: [id number], DOB/AGE: 1966/07/20   Admit date: 02/17/2019 Date of Consult: 02/18/2019  Primary Physician: Janith Lima, MD Primary Cardiologist: Previously seen by Dr. Caryl Comes. Requesting Provider: Fayrene Helper, MD  Patient Profile    Ashley [name] is a 53 y.o. female with a history of hypertension, type 2 diabetes mellitus, tobacco abuse, chronic migraines, anxiety, insomnia, and COPD, who is being seen today for the evaluation of chest pain at the request of Dr. Florene Glen.  History of Present Illness    Ashley [name] is a 53 year old female with the above history. She was previously seen by Dr. Caryl Comes in 2017 for evaluation of exertional chest pain, dyspnea, and lower extremity edema. Echocardiogram at that time showed LVEF of 60-65% with no regional wall motional abnormalities. Patient was felt to have a high pretest likelihood of CAD based on risk factors and typical history so a cardiac catheterization was also ordered which showed no evidence of CAD.   Patient presented to the [facility] ED yesterday for evaluation of chest pain. Patient reports sudden onset of 8/10 substernal/left-sided chest pressure/squeezing at rest that radiated to her back, neck, and left elbow. The squeezing feeling has been intermittent but the pressure feeling has been constant. She notes associated lightheadedness, palpitations, and nausea. She denies any other recent episodes of chest pain but does note some exertional shortness of breath which she has had for a while. She reports stable 3 pillow orthopnea and some bilateral ankle swelling but denies any PND. Patient is not very activity because she often has episodes where she will feel lightheaded and dizzy, like the room is spinning, with palpitations when she stands up. She has one of these episodes almost daily. Chest pressure persisted so patient came to the ED for further evaluation.  EKG showed normal sinus  rhythm with some non-specific ST/T changes but no significant changes from prior tracings. I-stat troponin negative. Chest x-ray showed bibasilar atelectasis on the left. D-dimer negative. CBC and BMP unremarkable. Patient was admitted for further evaluation.  Currently, patient continues to report substernal chest pressure. She is also complaining of some posterior neck pain.    Patient has a 60 pack year smoking history and currently smokes 1.5 packs per day. She denies any alcohol or recreational drug use. Patient also has a family history of heart disease. Both her paternal grandparents had a heart attack in their 67's. Her mother and paternal grandmother have also had a stroke.    Past Medical History   Past Medical History:  Diagnosis Date  . Anxiety   . Bronchitis    uses inhaler if needed for bronchitis, lasted used -2014  . Cataract    bilateral  . Chronic headaches    migraines - in past, uses Phenergan for nausea   . Deficiency anemia 12/30/2017  . Diabetes mellitus without complication ([place])    taken off Metformin since 04/2014, HgbA1C - normal, will follow up with PCP- Dr. Deborra Medina, 07/2014  . GERD (gastroesophageal reflux disease)   . H/O exercise stress test 2011   done at [facility]- told that it was WNL, done due to pt. having panic attacks   . History of blood transfusion Jun 26, 1966   at birth in [place], [place], unsure number of units  . Poor dentition    very poor oral health   . SVD (spontaneous vaginal delivery)    x 3  . Tobacco abuse  Past Surgical History:  Procedure Laterality Date  . ABDOMINAL HYSTERECTOMY    . APPENDECTOMY  1988  . CARDIAC CATHETERIZATION N/A 08/17/2016   Procedure: Left Heart Cath and Coronary Angiography;  Surgeon: Burnell Blanks, MD;  Location: [facility];  Service: Cardiovascular;  Laterality: N/A;  . CHOLECYSTECTOMY N/A 06/07/2014   Procedure: LAPAROSCOPIC CHOLECYSTECTOMY;  Surgeon: Ralene Ok, MD;  Location: [facility];   Service: General;  Laterality: N/A;  . CHOLECYSTECTOMY  June 07 2014  . COLONOSCOPY  02/16/2014   normal   . ESOPHAGOGASTRODUODENOSCOPY  12/28/2013  . KNEE ARTHROSCOPY  1995   left  . LAPAROSCOPIC ASSISTED VAGINAL HYSTERECTOMY N/A 04/15/2014   Procedure: LAPAROSCOPIC ASSISTED VAGINAL HYSTERECTOMY;  Surgeon: Marylynn Pearson, MD;  Location: [facility];  Service: Gynecology;  Laterality: N/A;  . LAPAROSCOPIC BILATERAL SALPINGO OOPHERECTOMY Bilateral 04/15/2014   Procedure: LAPAROSCOPIC BILATERAL SALPINGO OOPHORECTOMY;  Surgeon: Marylynn Pearson, MD;  Location: [facility];  Service: Gynecology;  Laterality: Bilateral;  . TUBAL LIGATION       Allergies  Allergies  Allergen Reactions  . Penicillins Anaphylaxis and Swelling    Throat swells Has patient had a PCN reaction causing immediate rash, facial/tongue/throat swelling, SOB or lightheadedness with hypotension: Yes Has patient had a PCN reaction causing severe rash involving mucus membranes or skin necrosis: No Has patient had a PCN reaction that required hospitalization: No Has patient had a PCN reaction occurring within the last 10 years: No If all of the above answers are "NO", then may proceed with Cephalosporin use.   . Topamax [Topiramate] Other (See Comments)    Hands and feet  go numb    Inpatient Medications    . amitriptyline  75 mg Oral QHS  . aspirin EC  81 mg Oral Daily  . docusate sodium  100 mg Oral QHS  . enoxaparin (LOVENOX) injection  40 mg Subcutaneous QHS  . insulin aspart  0-5 Units Subcutaneous QHS  . insulin aspart  0-9 Units Subcutaneous TID WC  . insulin detemir  20 Units Subcutaneous Daily  . irbesartan  300 mg Oral Daily  . pantoprazole  40 mg Oral Daily  . rosuvastatin  20 mg Oral q1800  . sodium chloride flush  3 mL Intravenous Q12H  . traZODone  150 mg Oral QHS  . venlafaxine  37.5 mg Oral BID    Family History    Family History  Problem Relation Age of Onset  . Diabetes Mother   . Hyperlipidemia  Mother   . Hypertension Mother   . Anxiety disorder Mother   . Depression Mother   . Drug abuse Mother   . Prostate cancer Father   . Alcohol abuse Father   . Lung cancer Maternal Grandfather        smoked  . Emphysema Maternal Grandfather        smoked  . COPD Maternal Grandmother        never smoked   . Dementia Maternal Grandmother   . Thyroid cancer Paternal Aunt   . Colon cancer Neg Hx   . Rectal cancer Neg Hx   . Stomach cancer Neg Hx   . Migraines Neg Hx    She indicated that her mother is alive. She indicated that her father is alive. She indicated that her maternal grandmother is deceased. She indicated that her maternal grandfather is deceased. She indicated that her paternal grandmother is deceased. She indicated that her paternal grandfather is deceased. She indicated that the status of her  paternal aunt is unknown. She indicated that the status of her neg hx is unknown.   Social History    Social History   Socioeconomic History  . Marital status: Married    Spouse name: Alvester Chou  . Number of children: 6  . Years of education: 63  . Highest education level: Not on file  Occupational History  . Occupation:      Comment: helps run a friends business  Social Needs  . Financial resource strain: Not on file  . Food insecurity:    Worry: Not on file    Inability: Not on file  . Transportation needs:    Medical: Not on file    Non-medical: Not on file  Tobacco Use  . Smoking status: Current Every Day Smoker    Packs/day: 2.00    Years: 35.00    Pack years: 70.00    Types: Cigarettes  . Smokeless tobacco: Never Used  . Tobacco comment: 08-17-17- pt reports smokes "almost 2 packs a day"  Substance and Sexual Activity  . Alcohol use: No    Alcohol/week: 0.0 standard drinks  . Drug use: No  . Sexual activity: Yes    Partners: Male    Birth control/protection: Surgical  Lifestyle  . Physical activity:    Days per week: Not on file    Minutes per session: Not  on file  . Stress: Not on file  Relationships  . Social connections:    Talks on phone: Not on file    Gets together: Not on file    Attends religious service: Not on file    Active member of club or organization: Not on file    Attends meetings of clubs or organizations: Not on file    Relationship status: Not on file  . Intimate partner violence:    Fear of current or ex partner: Not on file    Emotionally abused: Not on file    Physically abused: Not on file    Forced sexual activity: Not on file  Other Topics Concern  . Not on file  Social History Narrative   5 living children, one child is a crack cocaine addict who often breaks into their house to steal money. One child died of cerebral palsy.   Caffeine use: Dr Malachi Bonds (3 per day)   2 cups coffee per day     Review of Systems    Review of Systems  Constitutional: Negative for chills and fever.  HENT: Positive for congestion.   Respiratory: Positive for shortness of breath. Negative for cough, hemoptysis and sputum production.   Cardiovascular: Positive for chest pain, palpitations, orthopnea and leg swelling. Negative for PND.  Gastrointestinal: Positive for nausea. Negative for blood in stool and vomiting.  Genitourinary: Negative for hematuria.  Musculoskeletal: Positive for neck pain.  Neurological: Positive for dizziness. Negative for loss of consciousness.  Endo/Heme/Allergies: Does not bruise/bleed easily.  Psychiatric/Behavioral: Positive for substance abuse.    Physical Exam    Blood pressure 107/62, pulse 64, temperature 97.7 F (36.5 C), temperature source Oral, resp. rate 16, height 5\' 2"  (1.575 m), weight 79.1 kg, last menstrual period 02/12/2014, SpO2 96 %.  General: 53 y.o. obese Caucasian female resting comfortably in no acute distress. Pleasant and cooperative. HEENT: Normal  Neck: Supple. No carotid bruits or JVD appreciated. Lungs: No increased work of breathing. Clear to auscultation bilaterally.  No wheezes, rhonchi, or rales. Heart: RRR. Distinct S1 and S2. No murmurs, gallops, or rubs.  Abdomen:  Soft, non-distended, and non-tender to palpation. Bowel sounds present. Extremities: No significant lower extremity edema. Radial pulse 2+ on the right and 1+ on the left.  Skin: Warm and dry. Neuro: Alert and oriented x3. No focal deficits. Moves all extremities spontaneously. Psych: Normal affect. Responds appropriately.   Labs    Troponin [facility] of Care Test) Recent Labs    02/17/19 1606  TROPIPOC 0.01   Recent Labs    02/17/19 2105 02/18/19 0229 02/18/19 0721  TROPONINI <0.03 <0.03 <0.03   Lab Results  Component Value Date   WBC 6.1 02/18/2019   HGB 12.4 02/18/2019   HCT 37.7 02/18/2019   MCV 94.5 02/18/2019   PLT 322 02/18/2019    Recent Labs  Lab 02/18/19 0229  NA 140  K 3.5  CL 109  CO2 21*  BUN 10  CREATININE 1.05*  CALCIUM 8.1*  GLUCOSE 219*   Lab Results  Component Value Date   CHOL 114 10/20/2018   HDL 39.70 10/20/2018   LDLCALC 54 10/20/2018   TRIG 103.0 10/20/2018   Lab Results  Component Value Date   DDIMER 0.37 02/17/2019     Radiology Studies    Dg Chest 2 View  Result Date: 02/18/2019 CLINICAL DATA:  Left-sided chest pain for several hours, initial encounter EXAM: CHEST - 2 VIEW COMPARISON:  02/17/2011 FINDINGS: Cardiac shadow is stable when compared with the prior exam. Linear densities are noted in the bases bilaterally again likely related to atelectasis in the left base and scarring in the right base. No sizable effusion is seen. No other focal infiltrate is noted. IMPRESSION: Stable scarring in the right lung base. New basilar atelectasis on the left. Electronically Signed   By: Inez Catalina M.D.   On: 02/18/2019 09:27   Dg Chest 2 View  Result Date: 02/17/2019 CLINICAL DATA:  Chest pain radiating to the left arm. Nausea and shortness of breath. EXAM: CHEST - 2 VIEW COMPARISON:  12/30/2017 FINDINGS: Linear densities compatible with  scarring in the right middle lobe and lingula, potentially with scarring or subsegmental atelectasis increased in the lingula and left lower lobe. The lungs appear otherwise clear. Cardiac and mediastinal margins appear normal. No pleural effusion is identified. No appreciable bony abnormality. IMPRESSION: 1. Linear opacities favoring scarring or atelectasis in the lingula, right middle lobe, and questionably in the left lower lobe. The right middle lobe scarring is stable but the left basilar linear opacities are mildly increased from 12/30/2017. Electronically Signed   By: Van Clines M.D.   On: 02/17/2019 17:13    EKG     EKG: EKG was personally reviewed and demonstrates: Normal sinus rhythm with non-specific ST/T changes.  Telemetry: Telemetry was personally reviewed and demonstrates: Sinus rhythm with heart rates in the 60's to 80's.   Cardiac Imaging    Echocardiogram 08/15/2016: Study Conclusions: - Left ventricle: The cavity size was normal. Wall thickness was   normal. Systolic function was normal. The estimated ejection   fraction was in the range of 60% to 65%. Wall motion was normal;   there were no regional wall motion abnormalities. Left   ventricular diastolic function parameters were normal. - Aortic valve: There was no stenosis. - Mitral valve: There was no significant regurgitation. - Right ventricle: The cavity size was normal. Systolic function   was normal. - Pulmonary arteries: No complete TR doppler jet so unable to   estimate PA systolic pressure. - Inferior vena cava: The vessel was normal in size. The  respirophasic diameter changes were in the normal range (>= 50%),   consistent with normal central venous pressure.  Impressions: - Normal study. _______________  Left Heart Catheterization 08/17/2016: 1. No angiographic evidence of CAD 2. Normal LV systolic function 3. Normal LV filling pressures  Recommendations: No further ischemic workup.    Assessment & Plan    Chest Pain - Patient presented sudden onset of chest pain at rest. She had normal coronaries on a cardiac catheterization in 07/2016.  - EKG showed no acute ischemic changes compared to prior tracings. - Troponin negative x4.  - Echo pending. - Patient continues to report some chest pressure at this time. - Will plan for outpatient coronary CT. Will prescribe one time dose of Lopressor 50mg  to be taken 1 hour prior to CT. Patient could be having coronary vasospasm - will give sublingual nitroglycerin at discharge. If this help any future pain and CT is normal, can try long-acting nitrate.  Hypertension - BP currently well controlled at 107/62. - Will decrease Olmesartan from 40mg  to 20mg  daily given patient is also on Ertugliflozin-Metformin has reported some lightheadedness, dizziness (like the room is spinning), and palpitations with standing. Telemetry shows no normal sinus rhythm with no arrhythmias.   Hyperlipidemia - Continue home Crestor 20mg  daily.   Otherwise, per primary team.  Signed, Darreld Mclean, PA-C 02/18/2019, 10:58 AM  For questions or updates, please contact   Please consult [email or web address] for contact info under Cardiology/STEMI.  I have seen and examined the patient along with Darreld Mclean, PA-C.  I have reviewed the chart, notes and new data.  I agree with PA's note.  Key new complaints: symptoms are atypical, but not entirely inconsistent with coronary spasm. Started on SGLT2 inhibitor 2 recently Key examination changes: normal CV exam, BP low normal range. Key new findings / data: normal coronary arteries on angiography 2 years ago. No correlation between subjective lightheadedness and palpitations and normal rhythm on telemetry.   PLAN: Outpatient coronary CT angiography. Reduce olmesartan dose to 20 mg daily (BP likely lower due to diuretic effect of SGLT2 inhibitor). Empirical NTG SL prn chest pain (cautioned to sit or  recline when taking it), for possible coronary spasm. If NTG helps, can prescribe long acting nitrates. If migraines worsen, amlodipine is a good alternative. Reviewed relationship between smoking and coronary spasm. Strongly encourage smoking cessation.  Sanda Klein, MD, [facility] [phone] 02/18/2019, 12:17 PM

## 2019-02-18 NOTE — Discharge Summary (Signed)
Physician Discharge Summary  Ashley [name] CVE:[id number] DOB: 11-May-1966 DOA: 02/17/2019  PCP: Ashley Lima, MD  Admit date: 02/17/2019 Discharge date: 02/18/2019  Time spent: 35 minutes  Recommendations for Outpatient Follow-up:  1. Follow up outpatient CBC/CMP 2. Follow up with cardiology for Coronary CT.  Consider long acting nitrate based on trial of nitro 3. Follow up repeat CXR as outpatient   Discharge Diagnoses:  Principal Problem:   Chest pain Active Problems:   Anxiety state   Chronic bronchitis ([name])   Diabetes mellitus with complication [name]   Essential hypertension   Discharge Condition: stable  Diet recommendation: heart healthy  Filed Weights   02/17/19 2313 02/18/19 0517  Weight: 79.9 kg 79.1 kg    History of present illness:  Ashley [name] is Ashley [name] 53 y.o. female with medical history significant for hypertension, diabetes, tobacco abuse, chronic migraines, anxiety, insomnia, and COPD, now presenting to the emergency department for evaluation of chest pain.  Patient reports that she developed acute onset chest pain early this afternoon.  Patient had been in her usual state of health, was having an uneventful day, and was sitting down smoking Ashley [name] cigarette when she developed acute onset of chest pain at approximately 1:30 PM.  She describes this as moderate in intensity, originating from the central chest and radiating through the left arm, associated with mild nausea and mild shortness of breath, and without any alleviating or exacerbating factors.  She has never experienced similar symptoms.  She reports Ashley [name] mild chronic cough that is unchanged, reports some mild chronic ankle edema, but no calf tenderness.  She denies history of VTE.  No recent fevers or chills, no abdominal pain, no nausea, vomiting, or diarrhea.  She was admitted for chest pain rule out.  Serial troponins were negative.  Echo with normal EF without reported WMA.  Seen by cardiology who recommended  decrease dose of arb and d/c with nitro.  Plan for outpatient coronary CT.     Hospital Course:  1. Chest pain  - Presents with chest pain, onset at rest, constant since ~13:30 with no alleviating/exacerbating factors  - She had cardiac cath in August 2017 with no CAD - serial troponins negative, d-dimer 0.37, EKG with non-specific changes, CXR with likely atelectasis or scarring (rec f/u outpatient) - Cardiology c/s recommended outpatient coronary CT, reduce olmesartan dose, SL NTG prn CP.  Follow up outpatient.  2. Hypertension  - BP low-normal in ED  - Continue ARB as tolerated  (decreased dose as noted above)  3. Insulin-dependent DM  - A1c was 7.9% this month  - managed at home with ertugliflozin, metformin, and Levemir 35 units qD   - resume home regimen  4. COPD  - Stable, continue albuterol as needed   5. Anxiety; insomnia  - Continue Effexor, trazodone, Elavil, and Klonopin     Procedures: Echo IMPRESSIONS    1. The left ventricle has normal systolic function with an ejection fraction of 60-65%. The cavity size was normal. Left ventricular diastolic parameters were normal.  2. The right ventricle has normal systolic function. The cavity was normal. There is no increase in right ventricular wall thickness.  3. The mitral valve is normal in structure.  4. The tricuspid valve is normal in structure.  5. The aortic valve is tricuspid.  6. The pulmonic valve was normal in structure.  Consultations:  cards  Discharge Exam: Vitals:   02/18/19 0517 02/18/19 1409  BP: 107/62 (!) 112/57  Pulse: 64  73  Resp: 16 15  Temp: 97.7 F (36.5 C) 98 F (36.7 C)  SpO2: 96% 97%   Continued CP, low level.  Constant. Chronic DOE, no change. Open to d/c, asking about follow up.  General: No acute distress. Cardiovascular: Heart sounds show Ashley [name] regular rate, and rhythm.  Lungs: Clear to auscultation bilaterally  Abdomen: Soft, nontender, nondistended Neurological:  Alert and oriented 3. Moves all extremities 4. Cranial nerves II through XII grossly intact. Skin: Warm and dry. No rashes or lesions. Extremities: No clubbing or cyanosis. No edema. Psychiatric: Mood and affect are normal. Insight and judgment are appropriate.  Discharge Instructions   Discharge Instructions    Call MD for:  difficulty breathing, headache or visual disturbances   Complete by:  As directed    Call MD for:  extreme fatigue   Complete by:  As directed    Call MD for:  hives   Complete by:  As directed    Call MD for:  persistant dizziness or light-headedness   Complete by:  As directed    Call MD for:  persistant nausea and vomiting   Complete by:  As directed    Call MD for:  redness, tenderness, or signs of infection (pain, swelling, redness, odor or green/yellow discharge around incision site)   Complete by:  As directed    Call MD for:  severe uncontrolled pain   Complete by:  As directed    Call MD for:  temperature >100.4   Complete by:  As directed    Diet - low sodium heart healthy   Complete by:  As directed    Discharge instructions   Complete by:  As directed    You were seen for chest pain.  Your labs and echo were reassuring.    Cardiology saw you and recommended decreasing your olmesartan as an outpatient.  They've also prescribed nitro which you can use as needed (please sit down before using this).  If this helps, they may consider Ashley [name] longer acting medicine.  Cardiology will arrange Ashley [name] CT scan as an outpatient to look for evidence of coronary artery disease.  Please follow up with your PCP as an outpatient.  Your chest x ray had some chronic changes, please follow up with your PCP as an outpatient, you may need some repeat imaging.  Return for new, recurrent, or worsening symptoms.  Please ask your PCP to request records from this hospitalization so they know what was done and what the next steps will be.   Increase activity slowly   Complete  by:  As directed      Allergies as of 02/18/2019      Reactions   Penicillins Anaphylaxis, Swelling   Throat swells Has patient had Ashley [name] PCN reaction causing immediate rash, facial/tongue/throat swelling, SOB or lightheadedness with hypotension: Yes Has patient had Letonia Stead PCN reaction causing severe rash involving mucus membranes or skin necrosis: No Has patient had Kinsey Karch PCN reaction that required hospitalization: No Has patient had Versia Mignogna PCN reaction occurring within the last 10 years: No If all of the above answers are "NO", then may proceed with Cephalosporin use.   Topamax [topiramate] Other (See Comments)   Hands and feet  go numb      Medication List    TAKE these medications   Adjustable Lancing Device Misc Up to tid testing   albuterol 108 (90 Base) MCG/ACT inhaler Commonly known as:  PROVENTIL HFA;VENTOLIN HFA Inhale 2 puffs into the lungs every  6 (six) hours as needed for wheezing or shortness of breath.   amitriptyline 50 MG tablet Commonly known as:  ELAVIL Take 1.5 tablets (75 mg total) by mouth at bedtime.   AQUALANCE LANCETS 30G Misc   aspirin EC 81 MG tablet Take 1 tablet (81 mg total) by mouth daily.   BAYER CONTOUR TEST test strip Generic drug:  glucose blood Testing for up to tid   BOTOX 200 units Solr Generic drug:  Botulinum Toxin Type Loreta Blouch INJECT 155 UNITS IN THE MUSCLE INTO HEAD AND NECK MUSCLES EVERY 3 MONTHS BY PROVIDER   cholecalciferol 25 MCG (1000 UT) tablet Commonly known as:  VITAMIN D3 Take 2,000 Units by mouth daily.   clonazePAM 1 MG tablet Commonly known as:  KLONOPIN Take 1 tablet (1 mg total) by mouth 2 (two) times daily as needed for anxiety.   dicyclomine 10 MG capsule Commonly known as:  BENTYL TAKE 1 CAPSULE BY MOUTH TWICE DAILY AS NEEDED FOR  CRAMPING  AND  ABDIMINAL  PAIN What changed:  See the new instructions.   docusate sodium 100 MG capsule Commonly known as:  COLACE Take 100 mg by mouth at bedtime.   Erenumab-aooe 140 MG/ML  Soaj Commonly known as:  AIMOVIG Inject 140 mg into the skin every 30 (thirty) days.   Ertugliflozin-metFORMIN HCl 2.04-999 MG Tabs Commonly known as:  SEGLUROMET Take 1 tablet by mouth 2 (two) times daily.   insulin detemir 100 UNIT/ML injection Commonly known as:  LEVEMIR Inject 0.3 mLs (30 Units total) into the skin daily. What changed:  how much to take   Insulin Pen Needle 32G X 6 MM Misc Use daily as directed   nitroGLYCERIN 0.4 MG SL tablet Commonly known as:  NITROSTAT Place 1 tablet (0.4 mg total) under the tongue every 5 (five) minutes x 3 doses as needed for chest pain.   olmesartan 20 MG tablet Commonly known as:  BENICAR Take 1 tablet (20 mg total) by mouth daily. What changed:    medication strength  how much to take   pantoprazole 40 MG tablet Commonly known as:  PROTONIX Take 1 tablet (40 mg total) by mouth daily.   promethazine 25 MG tablet Commonly known as:  PHENERGAN Take 1 tablet (25 mg total) by mouth every 6 (six) hours as needed for nausea or vomiting.   rizatriptan 10 MG disintegrating tablet Commonly known as:  MAXALT-MLT Take 1 tablet (10 mg total) by mouth as needed for migraine. May repeat in 2 hours if needed. Do not take more than 3 tablets in Sloan Takagi week.   rosuvastatin 20 MG tablet Commonly known as:  CRESTOR TAKE 1 TABLET BY MOUTH DAILY   traZODone 150 MG tablet Commonly known as:  DESYREL Take 1 tablet (150 mg total) by mouth at bedtime.   venlafaxine 37.5 MG tablet Commonly known as:  EFFEXOR TAKE 1 TABLET BY MOUTH TWICE DAILY      Allergies  Allergen Reactions  . Penicillins Anaphylaxis and Swelling    Throat swells Has patient had Roxy Filler PCN reaction causing immediate rash, facial/tongue/throat swelling, SOB or lightheadedness with hypotension: Yes Has patient had Allana Shrestha PCN reaction causing severe rash involving mucus membranes or skin necrosis: No Has patient had Adiel Erney PCN reaction that required hospitalization: No Has patient had Latham Kinzler  PCN reaction occurring within the last 10 years: No If all of the above answers are "NO", then may proceed with Cephalosporin use.   . Topamax [Topiramate] Other (See Comments)  Hands and feet  go numb   Follow-up Information    Lendon Colonel, NP Follow up.   Specialties:  Nurse Practitioner, Radiology, Cardiology Why:  You have Jery Hollern hospital follow-up visit scheduled for 03/09/2019 at 9:00am. Please arrive 15 minutes early for check-in.  Contact information: [address] [place] [address] [phone]        Ashley Lima, MD Follow up.   Specialty:  Internal Medicine Contact information: [address] [address] [phone]            The results of significant diagnostics from this hospitalization (including imaging, microbiology, ancillary and laboratory) are listed below for reference.    Significant Diagnostic Studies: Dg Chest 2 View  Result Date: 02/18/2019 CLINICAL DATA:  Left-sided chest pain for several hours, initial encounter EXAM: CHEST - 2 VIEW COMPARISON:  02/17/2011 FINDINGS: Cardiac shadow is stable when compared with the prior exam. Linear densities are noted in the bases bilaterally again likely related to atelectasis in the left base and scarring in the right base. No sizable effusion is seen. No other focal infiltrate is noted. IMPRESSION: Stable scarring in the right lung base. New basilar atelectasis on the left. Electronically Signed   By: Inez Catalina M.D.   On: 02/18/2019 09:27   Dg Chest 2 View  Result Date: 02/17/2019 CLINICAL DATA:  Chest pain radiating to the left arm. Nausea and shortness of breath. EXAM: CHEST - 2 VIEW COMPARISON:  12/30/2017 FINDINGS: Linear densities compatible with scarring in the right middle lobe and lingula, potentially with scarring or subsegmental atelectasis increased in the lingula and left lower lobe. The lungs appear otherwise clear. Cardiac and mediastinal margins  appear normal. No pleural effusion is identified. No appreciable bony abnormality. IMPRESSION: 1. Linear opacities favoring scarring or atelectasis in the lingula, right middle lobe, and questionably in the left lower lobe. The right middle lobe scarring is stable but the left basilar linear opacities are mildly increased from 12/30/2017. Electronically Signed   By: Van Clines M.D.   On: 02/17/2019 17:13    Microbiology: Recent Results (from the past 240 hour(s))  Wet prep, genital     Status: Abnormal   Collection Time: 02/10/19  9:50 AM  Result Value Ref Range Status   WBC, Wet Prep HPF POC Many(17-25/hpf) (Jennefer Kopp) None Final   Bacteria Too Numerous to Count(>25/hpf) (Arminda Foglio) None Final   Trich, Wet Prep None Seen (Aubrionna Istre) None Final   Yeast Wet Prep HPF POC None Seen (Bentlee Drier) None Final   Clue Cells Wet Prep HPF POC None Seen (Anthony Tamburo) None;Present Final     Labs: Basic Metabolic Panel: Recent Labs  Lab 02/17/19 1558 02/18/19 0229  NA 136 140  K 3.8 3.5  CL 105 109  CO2 23 21*  GLUCOSE 161* 219*  BUN 12 10  CREATININE 0.91 1.05*  CALCIUM 8.9 8.1*   Liver Function Tests: No results for input(s): AST, ALT, ALKPHOS, BILITOT, PROT, ALBUMIN in the last 168 hours. No results for input(s): LIPASE, AMYLASE in the last 168 hours. No results for input(s): AMMONIA in the last 168 hours. CBC: Recent Labs  Lab 02/17/19 1558 02/18/19 0721  WBC 8.3 6.1  HGB 13.9 12.4  HCT 43.2 37.7  MCV 93.9 94.5  PLT 376 322   Cardiac Enzymes: Recent Labs  Lab 02/17/19 2105 02/18/19 0229 02/18/19 0721  TROPONINI <0.03 <0.03 <0.03   BNP: BNP (last 3 results) No results for input(s): BNP  in the last 8760 hours.  ProBNP (last 3 results) Recent Labs    10/20/18 1042  PROBNP 11.0    CBG: Recent Labs  Lab 02/17/19 2309 02/18/19 0755 02/18/19 1119  GLUCAP 153* 141* 119*       Signed:  Fayrene Helper MD.  [facility] 02/18/2019, 6:44 PM

## 2019-02-18 NOTE — Progress Notes (Signed)
  Echocardiogram 2D Echocardiogram has been performed.  Jennette Dubin 02/18/2019, 2:57 PM

## 2019-02-19 ENCOUNTER — Telehealth: Payer: Self-pay | Admitting: *Deleted

## 2019-02-19 NOTE — Telephone Encounter (Signed)
Pt was on TCM report tried calling pt to make hosp follow-up there was no answer LMOM RTC. (CRM created)...Ashley [name]

## 2019-02-20 NOTE — Telephone Encounter (Signed)
Tried calling pt again still no answer LMOM RTC need to make hosp f/u w/PCP ASAP.Ashley KitchenJohny Chess

## 2019-02-23 ENCOUNTER — Encounter: Payer: PRIVATE HEALTH INSURANCE | Attending: Psychology | Admitting: Psychology

## 2019-02-23 ENCOUNTER — Telehealth: Payer: Self-pay | Admitting: *Deleted

## 2019-02-23 DIAGNOSIS — I10 Essential (primary) hypertension: Secondary | ICD-10-CM | POA: Diagnosis present

## 2019-02-23 DIAGNOSIS — R413 Other amnesia: Secondary | ICD-10-CM | POA: Diagnosis not present

## 2019-02-23 NOTE — Telephone Encounter (Signed)
Received PA for Aimovig from [place], pt's new insurance. PA completed, signed & faxed to [place], [employer]. Received a receipt of confirmation.

## 2019-02-24 ENCOUNTER — Encounter: Payer: Self-pay | Admitting: *Deleted

## 2019-02-24 ENCOUNTER — Other Ambulatory Visit: Payer: Self-pay | Admitting: Internal Medicine

## 2019-02-24 DIAGNOSIS — F411 Generalized anxiety disorder: Secondary | ICD-10-CM

## 2019-02-24 DIAGNOSIS — E1165 Type 2 diabetes mellitus with hyperglycemia: Secondary | ICD-10-CM

## 2019-02-24 MED ORDER — CLONAZEPAM 1 MG PO TABS: 1.0000 mg | ORAL_TABLET | Freq: Two times a day (BID) | ORAL | 3 refills | Status: DC | PRN

## 2019-02-24 MED ORDER — INSULIN DETEMIR 100 UNIT/ML ~~LOC~~ SOLN: 30.0000 [IU] | Freq: Every day | SUBCUTANEOUS | 0 refills | Status: DC

## 2019-02-24 NOTE — Telephone Encounter (Signed)
Copied from [place] [id number]. Topic: Quick Communication - Rx Refill/Question >> Feb 24, 2019 11:32 AM Selinda Flavin B, NT wrote: Medication: insulin detemir (LEVEMIR) 100 UNIT/ML injection clonazePAM (KLONOPIN) 1 MG tablet   Has the patient contacted their pharmacy? Yes.   (Agent: If no, request that the patient contact the pharmacy for the refill.) (Agent: If yes, when and what did the pharmacy advise?)  Preferred Pharmacy (with phone number or street name): [id number] - [place], [address] AT [address]: Please be advised that RX refills may take up to 3 business days. We ask that you follow-up with your pharmacy.

## 2019-02-24 NOTE — Telephone Encounter (Signed)
Requested Prescriptions  Pending Prescriptions Disp Refills  . clonazePAM (KLONOPIN) 1 MG tablet 60 tablet 3    Sig: Take 1 tablet (1 mg total) by mouth 2 (two) times daily as needed for anxiety.     Not Delegated - Psychiatry:  Anxiolytics/Hypnotics Failed - 02/24/2019 11:39 AM      Failed - This refill cannot be delegated      Failed - Urine Drug Screen completed in last 360 days.      Passed - Valid encounter within last 6 months    Recent Outpatient Visits          2 weeks ago Essential hypertension   [place], MD   3 months ago Acute bronchitis, unspecified organism   [place], Delphia Grates, NP   4 months ago DOE (dyspnea on exertion)   [place], MD   7 months ago Left hand pain   [place], Marvis Repress, FNP   8 months ago Intractable migraine with aura without status migrainosus   [place], Marvis Repress, [place]      Future Appointments            In 2 days Janith Lima, MD [place], [facility]         . insulin detemir (LEVEMIR) 100 UNIT/ML injection 10 mL 3    Sig: Inject 0.3 mLs (30 Units total) into the skin daily.     Endocrinology:  Diabetes - Insulins Passed - 02/24/2019 11:39 AM      Passed - HBA1C is between 0 and 7.9 and within 180 days    Hgb A1c MFr Bld  Date Value Ref Range Status  02/10/2019 7.9 (H) 4.6 - 6.5 % Final    Comment:    Glycemic Control Guidelines for People with Diabetes:Non Diabetic:  <6%Goal of Therapy: <7%Additional Action Suggested:  >8%          Passed - Valid encounter within last 6 months    Recent Outpatient Visits          2 weeks ago Essential hypertension   [place], MD   3 months ago Acute bronchitis, unspecified organism   [place], Delphia Grates, NP   4 months ago DOE (dyspnea on exertion)   [place], MD   7 months ago Left hand pain   [place], Marvis Repress, FNP   8 months ago Intractable migraine with aura without status migrainosus   [place], Marvis Repress, [place]      Future Appointments            In 2 days Janith Lima, MD [place], [facility]

## 2019-02-24 NOTE — Telephone Encounter (Addendum)
Received determination from [place]. Aimovig denied d/t the following reasons: patient is receiving Botox.   Patient should be able to use a savings card with this insurance plan to still get the [place]. Will send pt a mychart message.   Dr. Jaynee Eagles aware. If patient is unable to continue botox, we can try to get Aimovig approved again.

## 2019-02-24 NOTE — Telephone Encounter (Signed)
Requested medication (s) are due for refill today: yes  Requested medication (s) are on the active medication list: yes  Last refill: 10/20/18  Future visit scheduled: yes  Notes to clinic:  Not delegated    Requested Prescriptions  Pending Prescriptions Disp Refills   clonazePAM (KLONOPIN) 1 MG tablet 60 tablet 3    Sig: Take 1 tablet (1 mg total) by mouth 2 (two) times daily as needed for anxiety.     Not Delegated - Psychiatry:  Anxiolytics/Hypnotics Failed - 02/24/2019 11:39 AM      Failed - This refill cannot be delegated      Failed - Urine Drug Screen completed in last 360 days.      Passed - Valid encounter within last 6 months    Recent Outpatient Visits          2 weeks ago Essential hypertension   [place], MD   3 months ago Acute bronchitis, unspecified organism   [place], Delphia Grates, NP   4 months ago DOE (dyspnea on exertion)   [place], MD   7 months ago Left hand pain   [place], Marvis Repress, FNP   8 months ago Intractable migraine with aura without status migrainosus   [place], Marvis Repress, FNP      Future Appointments            In 2 days Janith Lima, MD [facility], [facility]         Signed Prescriptions Disp Refills   insulin detemir (LEVEMIR) 100 UNIT/ML injection 10 mL 0    Sig: Inject 0.3 mLs (30 Units total) into the skin daily.     Endocrinology:  Diabetes - Insulins Passed - 02/24/2019 11:39 AM      Passed - HBA1C is between 0 and 7.9 and within 180 days    Hgb A1c MFr Bld  Date Value Ref Range Status  02/10/2019 7.9 (H) 4.6 - 6.5 % Final    Comment:    Glycemic Control Guidelines for People with Diabetes:Non Diabetic:  <6%Goal of Therapy: <7%Additional Action Suggested:  >8%          Passed - Valid encounter  within last 6 months    Recent Outpatient Visits          2 weeks ago Essential hypertension   [place], MD   3 months ago Acute bronchitis, unspecified organism   [place], Delphia Grates, NP   4 months ago DOE (dyspnea on exertion)   [place], MD   7 months ago Left hand pain   [place], Marvis Repress, FNP   8 months ago Intractable migraine with aura without status migrainosus   [place], Marvis Repress, [place]      Future Appointments            In 2 days Janith Lima, MD [place], [facility]

## 2019-02-26 ENCOUNTER — Inpatient Hospital Stay: Payer: Self-pay | Admitting: Internal Medicine

## 2019-03-04 ENCOUNTER — Other Ambulatory Visit: Payer: Self-pay

## 2019-03-04 ENCOUNTER — Ambulatory Visit (INDEPENDENT_AMBULATORY_CARE_PROVIDER_SITE_OTHER): Payer: PRIVATE HEALTH INSURANCE | Admitting: Internal Medicine

## 2019-03-04 ENCOUNTER — Other Ambulatory Visit (INDEPENDENT_AMBULATORY_CARE_PROVIDER_SITE_OTHER): Payer: PRIVATE HEALTH INSURANCE

## 2019-03-04 ENCOUNTER — Encounter: Payer: Self-pay | Admitting: Internal Medicine

## 2019-03-04 VITALS — BP 104/52 | HR 84 | Temp 98.6°F | Resp 16 | Ht 62.0 in | Wt 174.0 lb

## 2019-03-04 DIAGNOSIS — R072 Precordial pain: Secondary | ICD-10-CM | POA: Diagnosis not present

## 2019-03-04 DIAGNOSIS — I959 Hypotension, unspecified: Secondary | ICD-10-CM | POA: Diagnosis not present

## 2019-03-04 DIAGNOSIS — R918 Other nonspecific abnormal finding of lung field: Secondary | ICD-10-CM

## 2019-03-04 LAB — BASIC METABOLIC PANEL
BUN: 13 mg/dL (ref 6–23)
CO2: 23 mEq/L (ref 19–32)
Calcium: 8.9 mg/dL (ref 8.4–10.5)
Chloride: 105 mEq/L (ref 96–112)
Creatinine, Ser: 0.89 mg/dL (ref 0.40–1.20)
GFR: 66.31 mL/min (ref >60.00)
Glucose, Bld: 151 mg/dL — ABNORMAL HIGH (ref 70–99)
Potassium: 3.8 mEq/L (ref 3.5–5.1)
Sodium: 137 mEq/L (ref 135–145)

## 2019-03-04 LAB — CORTISOL: CORTISOL PLASMA: 5.1 ug/dL

## 2019-03-04 LAB — TROPONIN I: TNIDX: 0 ug/l (ref 0.00–0.06)

## 2019-03-04 NOTE — Progress Notes (Signed)
Cardiology Office Note   Date:  03/09/2019   ID:  Ashley [name], DOB 1966-02-15, MRN [id number]  PCP:  Janith Lima, MD  Cardiologist:  Dr.Croitoru  Chief Complaint  Patient presents with   Chest Pain    No CAD     History of Present Illness: Ashley [name] is a 53 y.o. female who presents for ongoing assessment and management of hypertension, with other history to include tobacco abuse, anxiety, Type II diabetes, and COPD.She was seen by Dr. Sallyanne Kuster during recent hospitalization on 02/18/2019 for complaints of chest pain. It was noted that she had a cardiac cath in 2017 which was negative for CAD.   The patient reported substernal squeezing and bilateral ankle edema with 3 pillow orthopnea. She had some lightheadedness and dizziness with position change. It was noted that she has a 60 pack year smoking hx, smoking 1.5 ppd.   She was ruled out for ACS.She was recommended for coronary CT as OP. She was given Rx for NTG prn chest pain. Olmesartan dose was decreased. She feels about the same today. She had some chest pain when she was walking from her car into the building and remains  "sore" in her chest now. She has appointments with pulmonary and with her PCP for chronic back pain.    Past Medical History:  Diagnosis Date   Anxiety    Bronchitis    uses inhaler if needed for bronchitis, lasted used -2014   Cataract    bilateral   Chronic headaches    migraines - in past, uses Phenergan for nausea    Deficiency anemia 12/30/2017   Diabetes mellitus without complication ([place])    taken off Metformin since 04/2014, HgbA1C - normal, will follow up with PCP- Dr. Deborra Medina, 07/2014   GERD (gastroesophageal reflux disease)    H/O exercise stress test 2011   done at [facility]- told that it was WNL, done due to pt. having panic attacks    History of blood transfusion 11/17/1966   at birth in [place], [place], unsure number of units   Poor dentition    very poor oral health    SVD  (spontaneous vaginal delivery)    x 3   Tobacco abuse     Past Surgical History:  Procedure Laterality Date   [place] N/A 08/17/2016   Procedure: Left Heart Cath and Coronary Angiography;  Surgeon: Burnell Blanks, MD;  Location: [facility];  Service: Cardiovascular;  Laterality: N/A;   CHOLECYSTECTOMY N/A 06/07/2014   Procedure: LAPAROSCOPIC CHOLECYSTECTOMY;  Surgeon: Ralene Ok, MD;  Location: [facility];  Service: General;  Laterality: N/A;   CHOLECYSTECTOMY  June 07 2014   COLONOSCOPY  02/16/2014   normal    ESOPHAGOGASTRODUODENOSCOPY  12/28/2013   KNEE ARTHROSCOPY  1995   left   LAPAROSCOPIC ASSISTED VAGINAL HYSTERECTOMY N/A 04/15/2014   Procedure: LAPAROSCOPIC ASSISTED VAGINAL HYSTERECTOMY;  Surgeon: Marylynn Pearson, MD;  Location: [facility];  Service: Gynecology;  Laterality: N/A;   LAPAROSCOPIC BILATERAL SALPINGO OOPHERECTOMY Bilateral 04/15/2014   Procedure: LAPAROSCOPIC BILATERAL SALPINGO OOPHORECTOMY;  Surgeon: Marylynn Pearson, MD;  Location: [facility];  Service: Gynecology;  Laterality: Bilateral;   TUBAL LIGATION       Current Outpatient Medications  Medication Sig Dispense Refill   albuterol (PROVENTIL HFA;VENTOLIN HFA) 108 (90 Base) MCG/ACT inhaler Inhale 2 puffs into the lungs every 6 (six) hours as needed for wheezing or shortness  of breath.     amitriptyline (ELAVIL) 50 MG tablet Take 1.5 tablets (75 mg total) by mouth at bedtime. 135 tablet 1   AQUALANCE LANCETS 30G MISC   98   aspirin EC 81 MG tablet Take 1 tablet (81 mg total) by mouth daily. 90 tablet 1   BAYER CONTOUR TEST test strip Testing for up to tid 100 each 98   BOTOX 200 units SOLR INJECT 155 UNITS IN THE MUSCLE INTO HEAD AND NECK MUSCLES EVERY 3 MONTHS BY PROVIDER 1 each 0   cholecalciferol (VITAMIN D3) 25 MCG (1000 UT) tablet Take 2,000 Units by mouth daily.     clonazePAM (KLONOPIN) 1 MG tablet Take 1 tablet  (1 mg total) by mouth 2 (two) times daily as needed for anxiety. 60 tablet 3   dicyclomine (BENTYL) 10 MG capsule TAKE 1 CAPSULE BY MOUTH TWICE DAILY AS NEEDED FOR  CRAMPING  AND  ABDIMINAL  PAIN (Patient taking differently: Take 10 mg by mouth 2 (two) times daily as needed (Cramping and abdominal pain). ) 180 capsule 1   docusate sodium (COLACE) 100 MG capsule Take 100 mg by mouth at bedtime.     Erenumab-aooe (AIMOVIG) 140 MG/ML SOAJ Inject 140 mg into the skin every 30 (thirty) days. 1 pen 11   insulin detemir (LEVEMIR) 100 UNIT/ML injection Inject 0.3 mLs (30 Units total) into the skin daily. 10 mL 0   Insulin Pen Needle 32G X 6 MM MISC Use daily as directed 100 each 1   Lancet Devices (ADJUSTABLE LANCING DEVICE) MISC Up to tid testing  98   nitroGLYCERIN (NITROSTAT) 0.4 MG SL tablet Place 1 tablet (0.4 mg total) under the tongue every 5 (five) minutes x 3 doses as needed for chest pain. 30 tablet 5   pantoprazole (PROTONIX) 40 MG tablet Take 1 tablet (40 mg total) by mouth daily. 90 tablet 1   promethazine (PHENERGAN) 25 MG tablet Take 1 tablet (25 mg total) by mouth every 6 (six) hours as needed for nausea or vomiting. 30 tablet 1   rizatriptan (MAXALT-MLT) 10 MG disintegrating tablet Take 1 tablet (10 mg total) by mouth as needed for migraine. May repeat in 2 hours if needed. Do not take more than 3 tablets in a week. 10 tablet 3   rosuvastatin (CRESTOR) 20 MG tablet TAKE 1 TABLET BY MOUTH DAILY (Patient taking differently: Take 20 mg by mouth daily. ) 90 tablet 0   traZODone (DESYREL) 150 MG tablet Take 1 tablet (150 mg total) by mouth at bedtime. 90 tablet 1   venlafaxine (EFFEXOR) 37.5 MG tablet TAKE 1 TABLET BY MOUTH TWICE DAILY (Patient taking differently: Take 37.5 mg by mouth 2 (two) times daily. ) 180 tablet 1   No current facility-administered medications for this visit.     Allergies:   Penicillins and Topamax [topiramate]    Social History:  The patient  reports  that she has been smoking cigarettes. She has a 70.00 pack-year smoking history. She has never used smokeless tobacco. She reports that she does not drink alcohol or use drugs.   Family History:  The patient's family history includes Alcohol abuse in her father; Anxiety disorder in her mother; COPD in her maternal grandmother; Dementia in her maternal grandmother; Depression in her mother; Diabetes in her mother; Drug abuse in her mother; Emphysema in her maternal grandfather; Hyperlipidemia in her mother; Hypertension in her mother; Lung cancer in her maternal grandfather; Prostate cancer in her father; Thyroid cancer in her  paternal aunt.    ROS: All other systems are reviewed and negative. Unless otherwise mentioned in H&P    PHYSICAL EXAM: VS:  BP 122/62    Pulse 95    Ht 5\' 2"  (1.575 m)    Wt 173 lb (78.5 kg)    LMP 02/12/2014    SpO2 96%    BMI 31.64 kg/m  , BMI Body mass index is 31.64 kg/m. GEN: Well nourished, well developed, in no acute distress HEENT: normal Neck: no JVD, carotid bruits, or masses Cardiac: RRR; no murmurs, rubs, or gallops,no edema  Respiratory:  Clear to auscultation bilaterally, normal work of breathing GI: soft, nontender, nondistended, + BS MS: no deformity or atrophy Skin: warm and dry, no rash Neuro:  Strength and sensation are intact Psych: euthymic mood, full affect   EKG:  Not completed on this office visit.   Recent Labs: 05/14/2018: ALT 24 10/20/2018: Pro B Natriuretic peptide (BNP) 11.0 02/18/2019: Hemoglobin 12.4; Platelets 322 03/04/2019: BUN 13; Creatinine, Ser 0.89; Potassium 3.8; Sodium 137    Lipid Panel    Component Value Date/Time   CHOL 114 10/20/2018 1042   TRIG 103.0 10/20/2018 1042   HDL 39.70 10/20/2018 1042   CHOLHDL 3 10/20/2018 1042   VLDL 20.6 10/20/2018 1042   LDLCALC 54 10/20/2018 1042   LDLDIRECT 152.1 01/19/2013 0905      Wt Readings from Last 3 Encounters:  03/09/19 173 lb (78.5 kg)  03/04/19 174 lb (78.9 kg)    02/18/19 174 lb 6.4 oz (79.1 kg)      Other studies Reviewed: Echocardiogram 08-Sep-2016: Study Conclusions: - Left ventricle: The cavity size was normal. Wall thickness was normal. Systolic function was normal. The estimated ejection fraction was in the range of 60% to 65%. Wall motion was normal; there were no regional wall motion abnormalities. Left ventricular diastolic function parameters were normal. - Aortic valve: There was no stenosis. - Mitral valve: There was no significant regurgitation. - Right ventricle: The cavity size was normal. Systolic function was normal. - Pulmonary arteries: No complete TR doppler jet so unable to estimate PA systolic pressure. - Inferior vena cava: The vessel was normal in size. The respirophasic diameter changes were in the normal range (>= 50%), consistent with normal central venous pressure.  Impressions: - Normal study. _______________  Left Heart Catheterization 08/17/2016: 1. No angiographic evidence of CAD 2. Normal LV systolic function 3. Normal LV filling pressures  Recommendations: No further ischemic workup.   ASSESSMENT AND PLAN:  1.  Chronic Atypical chest pain:  She has had a cardiac cath in 2017 without any angiographic evidence of CAD.  I have offered reassurance and reminded her that the work up in the ED was also negative. She will continue current management without changes. She is to follow up with pulmonary and with PCP for chronic back pain.  2. Hypertension:  BP is currently well controlled. No changes in her regimen.   3. Diabetes: She is to continue with PCP for follow up.   4. Tobacco abuse: Cessation recommended.   Current medicines are reviewed at length with the patient today.    Labs/ tests ordered today include: None  Phill Myron. West Pugh, ANP, AACC   03/09/2019 11:01 AM    [employer] [place] [address] Office [phone] Fax [phone]

## 2019-03-04 NOTE — Patient Instructions (Signed)

## 2019-03-04 NOTE — Progress Notes (Signed)
Subjective:  Patient ID: Ashley [name], female    DOB: December 11, 1966  Age: 53 y.o. MRN: [id number]  CC: Chest Pain   HPI Ashley [name] presents for f/up - She was recently seen in the ED for chest pain.  She ruled out for cardiac ischemia.  She continues to complain of intermittent chest pain that she describes as a thumping and a squeezing sensation in the left anterior chest area.  She also complains of weakness, dizziness, and lightheadedness for 1 week.  When she was in the hospital she was told to decrease the dose of olmesartan which she has done.  Outpatient Medications Prior to Visit  Medication Sig Dispense Refill  . albuterol (PROVENTIL HFA;VENTOLIN HFA) 108 (90 Base) MCG/ACT inhaler Inhale 2 puffs into the lungs every 6 (six) hours as needed for wheezing or shortness of breath.    Marland Kitchen amitriptyline (ELAVIL) 50 MG tablet Take 1.5 tablets (75 mg total) by mouth at bedtime. 135 tablet 1  . AQUALANCE LANCETS 30G MISC   98  . aspirin EC 81 MG tablet Take 1 tablet (81 mg total) by mouth daily. 90 tablet 1  . BAYER CONTOUR TEST test strip Testing for up to tid 100 each 98  . BOTOX 200 units SOLR INJECT 155 UNITS IN THE MUSCLE INTO HEAD AND NECK MUSCLES EVERY 3 MONTHS BY PROVIDER 1 each 0  . cholecalciferol (VITAMIN D3) 25 MCG (1000 UT) tablet Take 2,000 Units by mouth daily.    . clonazePAM (KLONOPIN) 1 MG tablet Take 1 tablet (1 mg total) by mouth 2 (two) times daily as needed for anxiety. 60 tablet 3  . dicyclomine (BENTYL) 10 MG capsule TAKE 1 CAPSULE BY MOUTH TWICE DAILY AS NEEDED FOR  CRAMPING  AND  ABDIMINAL  PAIN (Patient taking differently: Take 10 mg by mouth 2 (two) times daily as needed (Cramping and abdominal pain). ) 180 capsule 1  . docusate sodium (COLACE) 100 MG capsule Take 100 mg by mouth at bedtime.    Eduard Roux (AIMOVIG) 140 MG/ML SOAJ Inject 140 mg into the skin every 30 (thirty) days. 1 pen 11  . insulin detemir (LEVEMIR) 100 UNIT/ML injection Inject 0.3 mLs (30 Units  total) into the skin daily. 10 mL 0  . Insulin Pen Needle 32G X 6 MM MISC Use daily as directed 100 each 1  . Lancet Devices (ADJUSTABLE LANCING DEVICE) MISC Up to tid testing  98  . nitroGLYCERIN (NITROSTAT) 0.4 MG SL tablet Place 1 tablet (0.4 mg total) under the tongue every 5 (five) minutes x 3 doses as needed for chest pain. 30 tablet 5  . pantoprazole (PROTONIX) 40 MG tablet Take 1 tablet (40 mg total) by mouth daily. 90 tablet 1  . promethazine (PHENERGAN) 25 MG tablet Take 1 tablet (25 mg total) by mouth every 6 (six) hours as needed for nausea or vomiting. 30 tablet 1  . rizatriptan (MAXALT-MLT) 10 MG disintegrating tablet Take 1 tablet (10 mg total) by mouth as needed for migraine. May repeat in 2 hours if needed. Do not take more than 3 tablets in a week. 10 tablet 3  . rosuvastatin (CRESTOR) 20 MG tablet TAKE 1 TABLET BY MOUTH DAILY (Patient taking differently: Take 20 mg by mouth daily. ) 90 tablet 0  . traZODone (DESYREL) 150 MG tablet Take 1 tablet (150 mg total) by mouth at bedtime. 90 tablet 1  . venlafaxine (EFFEXOR) 37.5 MG tablet TAKE 1 TABLET BY MOUTH TWICE DAILY (Patient taking  differently: Take 37.5 mg by mouth 2 (two) times daily. ) 180 tablet 1  . Ertugliflozin-metFORMIN HCl (SEGLUROMET) 2.04-999 MG TABS Take 1 tablet by mouth 2 (two) times daily. 180 tablet 1  . olmesartan (BENICAR) 20 MG tablet Take 1 tablet (20 mg total) by mouth daily. 30 tablet 2   No facility-administered medications prior to visit.     ROS Review of Systems  Constitutional: Positive for fatigue. Negative for appetite change, chills, diaphoresis and unexpected weight change.  HENT: Negative.   Eyes: Negative.   Respiratory: Negative for cough, shortness of breath and wheezing.   Cardiovascular: Positive for chest pain. Negative for palpitations and leg swelling.  Gastrointestinal: Positive for nausea. Negative for abdominal pain, constipation, diarrhea and vomiting.       She has rare episodes  of nausea  Endocrine: Negative.  Negative for polydipsia, polyphagia and polyuria.  Genitourinary: Negative.  Negative for difficulty urinating and dysuria.  Musculoskeletal: Negative.  Negative for arthralgias, back pain, myalgias and neck pain.  Skin: Negative.  Negative for color change, pallor and rash.  Neurological: Positive for dizziness, weakness and light-headedness. Negative for numbness and headaches.  Hematological: Negative for adenopathy. Does not bruise/bleed easily.  Psychiatric/Behavioral: Negative.     Objective:  BP (!) 104/52 (BP Location: Left Arm, Patient Position: Sitting, Cuff Size: Normal)   Pulse 84   Temp 98.6 F (37 C) (Oral)   Resp 16   Ht 5\' 2"  (1.575 m)   Wt 174 lb (78.9 kg)   LMP 02/12/2014   SpO2 95%   BMI 31.83 kg/m   BP Readings from Last 3 Encounters:  03/04/19 (!) 104/52  02/18/19 (!) 112/57  02/10/19 112/80    Wt Readings from Last 3 Encounters:  03/04/19 174 lb (78.9 kg)  02/18/19 174 lb 6.4 oz (79.1 kg)  02/10/19 176 lb 4 oz (79.9 kg)    Physical Exam Vitals signs reviewed.  Constitutional:      Appearance: She is not ill-appearing or diaphoretic.  HENT:     Nose: Nose normal.     Mouth/Throat:     Pharynx: Oropharynx is clear. No oropharyngeal exudate or posterior oropharyngeal erythema.  Eyes:     General: No scleral icterus.    Conjunctiva/sclera: Conjunctivae normal.  Neck:     Musculoskeletal: Normal range of motion and neck supple. No muscular tenderness.  Cardiovascular:     Rate and Rhythm: Normal rate and regular rhythm.     Heart sounds: No murmur. No friction rub. No gallop.      Comments: EKG --  Sinus  Rhythm  WITHIN NORMAL LIMITS  Pulmonary:     Effort: Pulmonary effort is normal.     Breath sounds: Normal breath sounds. No stridor. No wheezing, rhonchi or rales.  Abdominal:     General: Abdomen is flat.     Palpations: There is no hepatomegaly, splenomegaly or mass.     Tenderness: There is no  abdominal tenderness. There is no guarding.  Musculoskeletal: Normal range of motion.        General: No swelling.     Right lower leg: No edema.     Left lower leg: No edema.  Lymphadenopathy:     Cervical: No cervical adenopathy.  Skin:    General: Skin is warm and dry.  Neurological:     General: No focal deficit present.     Mental Status: She is oriented to person, place, and time. Mental status is at baseline.  Lab Results  Component Value Date   WBC 6.1 02/18/2019   HGB 12.4 02/18/2019   HCT 37.7 02/18/2019   PLT 322 02/18/2019   GLUCOSE 151 (H) 03/04/2019   CHOL 114 10/20/2018   TRIG 103.0 10/20/2018   HDL 39.70 10/20/2018   LDLDIRECT 152.1 01/19/2013   LDLCALC 54 10/20/2018   ALT 24 05/14/2018   AST 21 05/14/2018   NA 137 03/04/2019   K 3.8 03/04/2019   CL 105 03/04/2019   CREATININE 0.89 03/04/2019   BUN 13 03/04/2019   CO2 23 03/04/2019   TSH 1.40 12/30/2017   INR 0.91 05/14/2018   HGBA1C 7.9 (H) 02/10/2019   MICROALBUR <0.7 02/10/2019    Dg Chest 2 View  Result Date: 02/18/2019 CLINICAL DATA:  Left-sided chest pain for several hours, initial encounter EXAM: CHEST - 2 VIEW COMPARISON:  02/17/2011 FINDINGS: Cardiac shadow is stable when compared with the prior exam. Linear densities are noted in the bases bilaterally again likely related to atelectasis in the left base and scarring in the right base. No sizable effusion is seen. No other focal infiltrate is noted. IMPRESSION: Stable scarring in the right lung base. New basilar atelectasis on the left. Electronically Signed   By: Inez Catalina M.D.   On: 02/18/2019 09:27   Dg Chest 2 View  Result Date: 02/17/2019 CLINICAL DATA:  Chest pain radiating to the left arm. Nausea and shortness of breath. EXAM: CHEST - 2 VIEW COMPARISON:  12/30/2017 FINDINGS: Linear densities compatible with scarring in the right middle lobe and lingula, potentially with scarring or subsegmental atelectasis increased in the lingula  and left lower lobe. The lungs appear otherwise clear. Cardiac and mediastinal margins appear normal. No pleural effusion is identified. No appreciable bony abnormality. IMPRESSION: 1. Linear opacities favoring scarring or atelectasis in the lingula, right middle lobe, and questionably in the left lower lobe. The right middle lobe scarring is stable but the left basilar linear opacities are mildly increased from 12/30/2017. Electronically Signed   By: Van Clines M.D.   On: 02/17/2019 17:13    Assessment & Plan:   Camaya was seen today for chest pain.  Diagnoses and all orders for this visit:  Precordial pain- Her EKG is negative for ischemia.  Her troponin is also normal. -     Troponin I -; Future -     EKG 12-Lead -     CT Chest W Contrast; Future  Hypotension, unspecified hypotension type- She is symptomatically hypotensive.  I have therefore asked her to stop taking olmesartan and the SGLT2 inhibitor.  She will control her blood sugar with basal insulin.  When he blood pressure recovers I will consider starting alternative hypoglycemic agents.  Her random cortisol is in the normal range. -     Basic metabolic panel; Future -     Cortisol; Future  Abnormal chest x-ray with multiple lung nodules- See below -     CT Chest W Contrast; Future  Abnormal findings on diagnostic imaging of lung- Her recent chest x-ray showed that  the left basilar linear opacities are mildly increased from 12/30/2017, this is concerning for infection or cancer.  I have asked her to undergo a CT scan of the chest with contrast. -     CT Chest W Contrast; Future   I have discontinued Modesty B. Dorrough's Ertugliflozin-metFORMIN HCl and olmesartan. I am also having her maintain her docusate sodium, albuterol, Adjustable Lancing Device, AquaLance Lancets 30G, Bayer Contour Test, Insulin Pen  Needle, Erenumab-aooe, dicyclomine, traZODone, aspirin EC, rizatriptan, amitriptyline, Botox, rosuvastatin, venlafaxine,  pantoprazole, promethazine, cholecalciferol, nitroGLYCERIN, clonazePAM, and insulin detemir.  No orders of the defined types were placed in this encounter.    Follow-up: Return in about 4 weeks (around 04/01/2019).  Scarlette Calico, MD

## 2019-03-09 ENCOUNTER — Other Ambulatory Visit: Payer: Self-pay

## 2019-03-09 ENCOUNTER — Encounter: Payer: Self-pay | Admitting: Adult Health

## 2019-03-09 ENCOUNTER — Ambulatory Visit (INDEPENDENT_AMBULATORY_CARE_PROVIDER_SITE_OTHER): Payer: PRIVATE HEALTH INSURANCE | Admitting: Adult Health

## 2019-03-09 ENCOUNTER — Ambulatory Visit: Payer: Self-pay | Admitting: Adult Health

## 2019-03-09 VITALS — BP 122/62 | HR 95 | Ht 62.0 in | Wt 173.0 lb

## 2019-03-09 DIAGNOSIS — R0789 Other chest pain: Secondary | ICD-10-CM

## 2019-03-09 DIAGNOSIS — Z720 Tobacco use: Secondary | ICD-10-CM | POA: Diagnosis not present

## 2019-03-09 DIAGNOSIS — I10 Essential (primary) hypertension: Secondary | ICD-10-CM

## 2019-03-09 NOTE — Patient Instructions (Addendum)
Follow-Up: You will need a follow up appointment in 12 months.  Please call our office 2 months in advance, JAN 2021 to schedule this, [name] 2021 appointment.  You may see Ashley Axe, MD or one of the following Advanced Practice Providers on your designated Care Team:  Chanetta Marshall, NP  [name], [place]     Medication Instructions:  NO CHANGES- Your physician recommends that you continue on your current medications as directed. Please refer to the Current Medication list given to you today. If you need a refill on your cardiac medications before your next appointment, please call your pharmacy. Labwork: When you have labs (blood work) and your tests are completely normal, you will receive your results ONLY by [name] (if you have MyChart) -OR- A paper copy in the mail.  At [facility], you and your health needs are our priority.  As part of our continuing mission to provide you with exceptional heart care, we have created designated Provider Care Teams.  These Care Teams include your primary Cardiologist (physician) and Advanced Practice Providers (APPs -  Physician Assistants and Nurse Practitioners) who all work together to provide you with the care you need, when you need it.  Thank you for choosing [facility] at [facility]!!

## 2019-03-09 NOTE — Progress Notes (Signed)
Ty! MCr 

## 2019-03-10 ENCOUNTER — Ambulatory Visit (INDEPENDENT_AMBULATORY_CARE_PROVIDER_SITE_OTHER): Payer: Self-pay | Admitting: Family Medicine

## 2019-03-13 ENCOUNTER — Ambulatory Visit: Payer: Self-pay | Admitting: Psychology

## 2019-03-13 ENCOUNTER — Other Ambulatory Visit: Payer: Self-pay

## 2019-03-13 ENCOUNTER — Ambulatory Visit (INDEPENDENT_AMBULATORY_CARE_PROVIDER_SITE_OTHER)
Admission: RE | Admit: 2019-03-13 | Discharge: 2019-03-13 | Disposition: A | Discharge status: Home / Self Care | Payer: PRIVATE HEALTH INSURANCE | Source: Ambulatory Visit | Attending: Internal Medicine | Admitting: Internal Medicine

## 2019-03-13 DIAGNOSIS — R072 Precordial pain: Secondary | ICD-10-CM

## 2019-03-13 DIAGNOSIS — R918 Other nonspecific abnormal finding of lung field: Secondary | ICD-10-CM | POA: Diagnosis not present

## 2019-03-13 MED ORDER — IOHEXOL 300 MG/ML  SOLN
80.0000 mL | Freq: Once | INTRAMUSCULAR | Status: AC | PRN
  Administered 2019-03-13: 80 mL via INTRAVENOUS

## 2019-03-15 ENCOUNTER — Encounter: Payer: Self-pay | Admitting: Internal Medicine

## 2019-03-16 ENCOUNTER — Encounter: Payer: PRIVATE HEALTH INSURANCE | Admitting: Psychology

## 2019-03-16 ENCOUNTER — Encounter: Payer: Self-pay | Admitting: Psychology

## 2019-03-16 ENCOUNTER — Other Ambulatory Visit: Payer: Self-pay

## 2019-03-16 DIAGNOSIS — I10 Essential (primary) hypertension: Secondary | ICD-10-CM

## 2019-03-16 DIAGNOSIS — R413 Other amnesia: Secondary | ICD-10-CM | POA: Diagnosis not present

## 2019-03-16 NOTE — Progress Notes (Signed)
Mental Status Examination & Behavior Observations: The patient arrived on time to her 8:00am testing appointment. She was administered the Wechsler Adult Intelligence Scale, 4th Edition, and Wechsler Memory Scale, 4th Edition (Adult Battery). The evaluation lasted 240 minutes. She was alert and fully oriented. She was casually dressed and appeared to have adequate hygiene. Her spontaneous speech was clear and prosodic with normal rate, tone, and volume. She ambulated independently without difficulty. Mood was mildly anxious with appropriate and congruent affect. Thought processes appeared mostly organized and logical. Insight and Judgement were fair.   She was cooperative with all assigned tasks and appeared to give good effort. She did not any difficulty hearing or understanding test instructions and did not require much additional prompting. She exhibited good frustration tolerance on questions she did not know or tasks that were more difficult and persisted well throughout testing. (e.g. did not require many breaks).   Results of the WAIS-IV are as follows:   Composite Score Summary  Scale Sum of Scaled Scores Composite Score Percentile Rank 95% Conf. Interval Qualitative Description  Verbal Comprehension 32 VCI 103 58 97-109 Average  Perceptual Reasoning 22 PRI 84 14 79-91 Low Average  Working Memory 17 WMI 92 30 86-99 Average  Processing Speed 11 PSI 76 5 70-87 Borderline  Full Scale 82 FSIQ 87 19 83-91 Low Average  General Ability 54 GAI 94 34 89-99 Average   Index Level Discrepancy Comparisons  Comparison Score 1 Score 2 Difference Critical Value .05 Significant Difference Y/N Base Rate by Overall Sample  VCI - PRI 103 84 19 7.78 Y 8.0  VCI - WMI 103 92 11 8.31 Y 19.7  VCI - PSI 103 76 27 11.76 Y 4.9  PRI - WMI 84 92 -8 8.81 N 28.9  PRI - PSI 84 76 8 12.12 N 30.1  WMI - PSI 92 76 16 12.47 Y 14.9  FSIQ - GAI 87 94 -7 3.29 Y 8.1   Differences Between Subtest and Overall Mean of  Subtest Scores  Subtest Subtest Scaled Score Mean Scaled Score Difference Critical Value .05 Strength or Weakness Base Rate  Information 11 8.20 2.80 2.19 S 15-25%  Coding 5 8.20 -3.20 2.97 W 15-25%    Results of the WMS-IV (Adult battery) are as follows:   Brief Cognitive Status Exam Classification  Age Years of Education Raw Score Classification Level Base Rate  53 years 2 months 12 51 Average 100.0    Index Score Summary  Index Sum of Scaled Scores Index Score Percentile Rank 95% Confidence Interval Qualitative Descriptor  Auditory Memory (AMI) 49 113 81 106-119 High Average  Visual Memory (VMI) 23 74 4 69-81 Borderline  Visual Working Memory (VWMI) 16 88 21 82-96 Low Average  Immediate Memory (IMI) 32 86 18 80-93 Low Average  Delayed Memory (DMI) 40 100 50 93-107 Average   Primary Subtest Scaled Score Summary  Subtest Domain Raw Score Scaled Score Percentile Rank  Logical Memory I AM 32 13 84  Logical Memory II AM 30 13 84  Verbal [employer] I AM 37 11 63  Verbal [employer] II AM 12 12 75  Designs I VM 39 3 1  Designs II VM 45 8 25  Visual Reproduction I VM 25 5 5   Visual Reproduction II VM 13 7 16   Spatial Addition VWM 11 9 37  Symbol Span VWM 16 7 16     PROCESS SCORE CONVERSIONS  Auditory Memory Process Score Summary  Process Score Raw Score Scaled Score  Percentile Rank Cumulative Percentage (Base Rate)  LM II Recognition 29 - - >75%  VPA II Recognition 40 - - >75%   Visual Memory Process Score Summary  Process Score Raw Score Scaled Score Percentile Rank Cumulative Percentage (Base Rate)  DE I Content 26 5 5  -  DE I Spatial 9 4 2  -  DE II Content 26 7 16  -  DE II Spatial 11 9 37 -  DE II Recognition 16 - - 51-75%  VR II Recognition 5 - - 26-50%

## 2019-03-17 ENCOUNTER — Telehealth (INDEPENDENT_AMBULATORY_CARE_PROVIDER_SITE_OTHER): Payer: Self-pay

## 2019-03-17 ENCOUNTER — Encounter: Payer: Self-pay | Admitting: Psychology

## 2019-03-17 NOTE — Progress Notes (Signed)
Neuropsychological Consultation   Patient:   Ashley [name]   DOB:   [birth year]  MR Number:  [id number]  Location:  [facility] [place], [place] [id number] [place] [address] Dept: [phone]           Date of Service:   02/23/2019  Start Time:   1 PM End Time:   2 PM  Provider/Observer:  Ilean Skill, Psy.D.       Clinical Neuropsychologist       Billing Code/Service: Neurobehavioral status exam  Chief Complaint:    [name] is a 53 year old Caucasian female who was referred by Dr. Jaynee Eagles for neuropsychological evaluation due to the development of memory difficulties, expressive language changes and disorientation at times.  The patient reports that she has significant headaches and has for a long time is been treated by neurology for her migraines.  The patient also describes dizziness and what sounds to be postural hypotension as well as confusion and memory loss.  Reason for Service:  [name] is a 53 year old Caucasian female who was referred by Dr. Jaynee Eagles for neuropsychological evaluation due to the development of memory difficulties, expressive language changes and disorientation at times.  The patient reports that she started noticing memory issues about 6 months ago and initially was just minor issues.  Around 4 months ago the patient reports that her daughter started noticing the patient calling the dog by the wrong name and using a dog that they had had 10 years prior.  Other expressive language issues are noted including difficulty completing sentences.  The patient reports that her husband is telling her that he noticed things including the patient getting disoriented at times.  The patient describes problems with executive functioning and problem-solving.  The patient reports that there are times when she is disoriented and confused.  The patient reports that  she will get very dizzy if she stands for long periods of time and become very forgetful and losing her train of thought.  The patient reports that she fell in August and hit her head but all of the symptoms were present before this concussive event and she reports that she does not notice any change after this event.  The patient has a history of chronic migraines, syncopal events, and neuropathy in her feet and legs.  The patient also experiences neck and back pain as well.  The patient describes significant disturbance in sleep and that she averages about 4 hours per night when she takes trazodone and amitriptyline and reports that she will not sleep at all if she does not take these medications.  The patient reports that her grandmother was diagnosed with Alzheimer's when the grandmother was in her early 53s.  The grandmother lived for 4 years after the initial symptoms and died 2 years after being diagnosed with Alzheimer's.  The grandmother developed significant personality changes and became very aggressive and had tremors as well and the symptoms described by the patient sound more consistent with frontotemporal dementia than Alzheimer's.  The patient had a head CT exam in 2019 and the findings showed no evidence of acute hemorrhage, hydrocephalus, mass lesion or abnormal findings and it was deemed to be a normal CT scan.  Review of medical issues through the years include the patient being diagnosed with anxiety state, migraine with aura, tobacco abuse, somatizations disorder, diabetes with neuropathy, moderate episode of recurrent major depressive  disorder, thiamine deficiency, hypertension, lumbar radiculitis, and gastroesophageal reflux disease with esophagitis among others medical issues.   Current Status:  The patient describes increasing memory difficulties, confusion, dizziness, longstanding recurring migraine headaches, peripheral neuropathy and worsening executive functioning  deficits.  Reliability of Information: The information is derived from 1 hour face-to-face clinical interview.  All reports of husband or daughter reporting symptoms came from the patient not directly from her family members.  Behavioral Observation: [name]  presents as a 53 y.o.-year-old Right Caucasian Female who appeared her stated age. her dress was Appropriate and she was Well Groomed and her manners were Appropriate to the situation.  her participation was indicative of Appropriate and Redirectable behaviors.  There were not any physical disabilities noted.  she displayed an appropriate level of cooperation and motivation.     Interactions:    Active Appropriate and Redirectable  Attention:   abnormal and attention span appeared shorter than expected for age  Memory:   abnormal; remote memory intact, recent memory impaired  Visuo-spatial:  not examined  Speech (Volume):  normal  Speech:   normal; there were some mild issues noted with verbal fluency.  Thought Process:  Coherent and Relevant  Though Content:  WNL; not suicidal and not homicidal  Orientation:   person, place, time/date and situation  Judgment:   Fair  Planning:   Fair  Affect:    Anxious  Mood:    Anxious  Insight:   Fair  Intelligence:   normal  Marital Status/Living: The patient was born and raised in [facility] with 3 siblings.  The patient had typical childhood illnesses including measles, chickenpox, and mumps.  She did have some breathing issues initially after birth due to her lung being underdeveloped.  The patient is married and has been married for 26 years.  She was married once previously for 4 years.  The patient has a 53 year old, 53 year old, and 39 year old child.  Her son has dealt with substance abuse and is currently incarcerated.  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  Substance Use:  There is a documented history of tobacco abuse confirmed by the patient.  No other concerns of substance abuse are noted.  The patient has been prescribed Klonopin 1 mg taken twice per day for anxiety.  She also takes amitriptyline and trazodone at bedtime for sleep.  Education:   GED  Medical History:   Past Medical History:  Diagnosis Date  . Anxiety   . Bronchitis    uses inhaler if needed for bronchitis, lasted used -2014  . Cataract    bilateral  . Chronic headaches    migraines - in past, uses Phenergan for nausea   . Deficiency anemia 12/30/2017  . Diabetes mellitus without complication ([place])    taken off Metformin since 04/2014, HgbA1C - normal, will follow up with PCP- Dr. Deborra Medina, 07/2014  . GERD (gastroesophageal reflux disease)   . H/O exercise stress test 2011   done at [facility]- told that it was WNL, done due to pt. having panic attacks   . History of blood transfusion 1966/10/25   at birth in [name], [place], unsure number of units  . Poor dentition    very poor oral health   . SVD (spontaneous vaginal delivery)    x 3  . Tobacco abuse  Psychiatric History:  The patient does have a history of anxiety diagnoses as well as history of depression.  There is also a history of depression anxiety in her family.  Family Med/Psych History:  Family History  Problem Relation Age of Onset  . Diabetes Mother   . Hyperlipidemia Mother   . Hypertension Mother   . Anxiety disorder Mother   . Depression Mother   . Drug abuse Mother   . Prostate cancer Father   . Alcohol abuse Father   . Lung cancer Maternal Grandfather        smoked  . Emphysema Maternal Grandfather        smoked  . COPD Maternal Grandmother        never smoked   . Dementia Maternal Grandmother   . Thyroid cancer Paternal Aunt   . Colon cancer Neg Hx   . Rectal cancer Neg Hx   . Stomach cancer Neg Hx   . Migraines Neg Hx     Risk of  Suicide/Violence: low the patient denies any suicidal or homicidal ideation.  Impression/DX:  Arnika B. Curl is a 53 year old Caucasian female who was referred by Dr. Jaynee Eagles for neuropsychological evaluation due to the development of memory difficulties, expressive language changes and disorientation at times.  The patient reports that she started noticing memory issues about 6 months ago and initially was just minor issues.  Around 4 months ago the patient reports that her daughter started noticing the patient calling the dog by the wrong name and using a dog that they had had 10 years prior.  Other expressive language issues are noted including difficulty completing sentences.  The patient reports that her husband is telling her that he noticed things including the patient getting disoriented at times.  The patient describes problems with executive functioning and problem-solving.  The patient reports that there are times when she is disoriented and confused.  The patient reports that she will get very dizzy if she stands for long periods of time and become very forgetful and losing her train of thought.  The patient reports that she fell in August and hit her head but all of the symptoms were present before this concussive event and she reports that she does not notice any change after this event.  The patient has a history of chronic migraines, syncopal events, and neuropathy in her feet and legs.  The patient also experiences neck and back pain as well.  The patient describes significant disturbance in sleep and that she averages about 4 hours per night when she takes trazodone and amitriptyline and reports that she will not sleep at all if she does not take these medications.  The patient reports that her grandmother was diagnosed with Alzheimer's when the grandmother was in her early 40s.  The grandmother lived for 4 years after the initial symptoms and died 2 years after being diagnosed with  Alzheimer's.  The grandmother developed significant personality changes and became very aggressive and had tremors as well and the symptoms described by the patient sound more consistent with frontotemporal dementia than Alzheimer's.  The patient had a head CT exam in 2019 and the findings showed no evidence of acute hemorrhage, hydrocephalus, mass lesion or abnormal findings and it was deemed to be a normal CT scan.  Review of medical issues through the years include the patient being diagnosed with anxiety state, migraine with aura, tobacco abuse, somatizations disorder, diabetes with neuropathy, moderate episode of recurrent major depressive disorder,  thiamine deficiency, hypertension, lumbar radiculitis, and gastroesophageal reflux disease with esophagitis among others medical issues.    Disposition/Plan:  We have set the patient up for formal neuropsychological testing utilizing the Wechsler Adult Intelligence Scale-IV as well as the Wechsler Memory Scale-IV.  Once these measures are completed a determination will be made if there are needs for other formal/structured neuropsychological testing to assist in diagnostic considerations and treatment planning.  Diagnosis:    Memory loss         Electronically Signed   _______________________ Ilean Skill, Psy.D.

## 2019-03-17 NOTE — Telephone Encounter (Signed)
Called the patient to screen for covid-19 prior to tomorrow's appointment with Dr. Junius Roads - she does have a chronic smoker's cough," but it gets better with coffee". She answered "no" to all other questions.

## 2019-03-18 ENCOUNTER — Encounter (INDEPENDENT_AMBULATORY_CARE_PROVIDER_SITE_OTHER): Payer: Self-pay | Admitting: Family Medicine

## 2019-03-18 ENCOUNTER — Ambulatory Visit (INDEPENDENT_AMBULATORY_CARE_PROVIDER_SITE_OTHER): Payer: PRIVATE HEALTH INSURANCE | Admitting: Family Medicine

## 2019-03-18 ENCOUNTER — Other Ambulatory Visit: Payer: Self-pay

## 2019-03-18 DIAGNOSIS — M545 Low back pain, unspecified: Secondary | ICD-10-CM

## 2019-03-18 DIAGNOSIS — G8929 Other chronic pain: Secondary | ICD-10-CM

## 2019-03-18 MED ORDER — ETODOLAC 400 MG PO TABS: 400.0000 mg | ORAL_TABLET | Freq: Two times a day (BID) | ORAL | 3 refills | Status: DC | PRN

## 2019-03-18 NOTE — Progress Notes (Signed)
Office Visit Note   Patient: Ashley [name]           Date of Birth: [birth year]           MRN: [id number] Visit Date: 03/18/2019 Requested by: Janith Lima, MD [address], [place] [address] PCP: Janith Lima, MD  Subjective: Chief Complaint  Patient presents with  . Lower Back - Pain    Low back pain with radiating pain down both legs (posterior) to the feet x 2-3 months. +numbness/tingling both legs and feet - constant tingling. NKI    HPI: She is a 53 year old with low back pain.  Symptoms started about 3 months ago, no injury.  She started noticing pain when standing and bending forward at the sink.  Now it hurts whenever she walks for very long.  She gets occasional shooting pain down both legs and up her back to her neck.  Pain goes away when sitting and lying down.  Ibuprofen gives her some relief.  Denies any bowel or bladder dysfunction, fevers or chills or night sweats.  She has a history of back problems a couple years ago with normal MRI scan at that time.  Pain went away on its own.  This pain is a little bit different.  She has diabetes which is not under good control.  She has not been placed on any new medications in the past few months to cause any medication side effects.               ROS:  All other systems were reviewed and are negative.  Objective: Vital Signs: LMP 02/12/2014   Physical Exam:  General:  Alert and oriented, in no acute distress. Pulm:  Breathing unlabored. Psy:  Normal mood, congruent affect. Skin: No rash on her skin. Low back: Tender to palpation over the L4-5 level especially in the paraspinous muscles.  Very tight muscles in that area.  No pain in the sciatic notch but she does have some tenderness near the SI joints.  Lower extremity strength and reflexes are normal.  Straight leg raise is negative.  Imaging: None today.  Assessment & Plan: 1.  Chronic low back pain, probably muscular.  Neurologic exam is  nonfocal. -I gave him some home exercises and prescription for Lodine as needed.  Physical therapy referral for when they open up again.  If pain does not improve we will order new x-rays and possibly repeat MRI scan.     Procedures: No procedures performed  No notes on file     PMFS History: Patient Active Problem List   Diagnosis Date Noted  . Hypotension 03/04/2019  . Abnormal chest x-ray with multiple lung nodules 03/04/2019  . Chest pain 02/17/2019  . Lumbar radiculitis 02/10/2019  . Gastroesophageal reflux disease with esophagitis 02/10/2019  . Essential hypertension 10/20/2018  . Chronic migraine without aura, with intractable migraine, so stated, with status migrainosus 07/02/2018  . Thiamine deficiency 01/05/2018  . Claudication of both lower extremities ([place]) 12/30/2017  . Constipation 09/12/2017  . Moderate episode of recurrent major depressive disorder ([place]) 05/16/2017  . Diabetes mellitus with complication ([place])   . 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 Tobacco abuse 01/08/2017  . Chronic bronchitis ([place]) 10/20/2015  . Dyslipidemia, goal LDL below 70 04/12/2015  . Migraine with aura, intractable 08/12/2014  . Routine general medical examination at a health care facility 05/24/2011  . Anxiety state 09/30/2009  . INSOMNIA, CHRONIC 09/30/2009   Past Medical  History:  Diagnosis Date  . Anxiety   . Bronchitis    uses inhaler if needed for bronchitis, lasted used -2014  . Cataract    bilateral  . Chronic headaches    migraines - in past, uses Phenergan for nausea   . Deficiency anemia 12/30/2017  . Diabetes mellitus without complication ([place])    taken off Metformin since 04/2014, HgbA1C - normal, will follow up with PCP- Dr. Deborra Medina, 07/2014  . GERD (gastroesophageal reflux disease)   . H/O exercise stress test 2011   done at [facility]- told that it was WNL, done due to pt. having panic attacks   . History of blood transfusion June 22, 1966   at birth in [place], [place],  unsure number of units  . Poor dentition    very poor oral health   . SVD (spontaneous vaginal delivery)    x 3  . Tobacco abuse     Family History  Problem Relation Age of Onset  . Diabetes Mother   . Hyperlipidemia Mother   . Hypertension Mother   . Anxiety disorder Mother   . Depression Mother   . Drug abuse Mother   . Prostate cancer Father   . Alcohol abuse Father   . Lung cancer Maternal Grandfather        smoked  . Emphysema Maternal Grandfather        smoked  . COPD Maternal Grandmother        never smoked   . Dementia Maternal Grandmother   . Thyroid cancer Paternal Aunt   . Colon cancer Neg Hx   . Rectal cancer Neg Hx   . Stomach cancer Neg Hx   . Migraines Neg Hx     Past Surgical History:  Procedure Laterality Date  . ABDOMINAL HYSTERECTOMY    . APPENDECTOMY  1988  . CARDIAC CATHETERIZATION N/A 08/17/2016   Procedure: Left Heart Cath and Coronary Angiography;  Surgeon: Burnell Blanks, MD;  Location: [facility];  Service: Cardiovascular;  Laterality: N/A;  . CHOLECYSTECTOMY N/A 06/07/2014   Procedure: LAPAROSCOPIC CHOLECYSTECTOMY;  Surgeon: Ralene Ok, MD;  Location: [facility];  Service: General;  Laterality: N/A;  . CHOLECYSTECTOMY  June 07 2014  . COLONOSCOPY  02/16/2014   normal   . ESOPHAGOGASTRODUODENOSCOPY  12/28/2013  . KNEE ARTHROSCOPY  1995   left  . LAPAROSCOPIC ASSISTED VAGINAL HYSTERECTOMY N/A 04/15/2014   Procedure: LAPAROSCOPIC ASSISTED VAGINAL HYSTERECTOMY;  Surgeon: Marylynn Pearson, MD;  Location: [facility];  Service: Gynecology;  Laterality: N/A;  . LAPAROSCOPIC BILATERAL SALPINGO OOPHERECTOMY Bilateral 04/15/2014   Procedure: LAPAROSCOPIC BILATERAL SALPINGO OOPHORECTOMY;  Surgeon: Marylynn Pearson, MD;  Location: [facility];  Service: Gynecology;  Laterality: Bilateral;  . TUBAL LIGATION     Social History   Occupational History  . Occupation:      Comment: helps run a friends business  Tobacco Use  . Smoking status: Current  Every Day Smoker    Packs/day: 2.00    Years: 35.00    Pack years: 70.00    Types: Cigarettes  . Smokeless tobacco: Never Used  . Tobacco comment: 08-17-17- pt reports smokes "almost 2 packs a day"  Substance and Sexual Activity  . Alcohol use: No    Alcohol/week: 0.0 standard drinks  . Drug use: No  . Sexual activity: Yes    Partners: Male    Birth control/protection: Surgical

## 2019-03-26 ENCOUNTER — Ambulatory Visit: Payer: Self-pay

## 2019-03-26 ENCOUNTER — Ambulatory Visit: Payer: PRIVATE HEALTH INSURANCE | Admitting: Internal Medicine

## 2019-03-26 NOTE — Telephone Encounter (Signed)
Patient called and says she's having dizziness since 2 pm today and her BP is low. At 1545 the BP 87/65, 1600 BP 91/54. She says her pulse in the 70's. I asked about other symptoms, she says left side chest pain at a 3 since 2 pm. She says she doesn't want to go to the ED, is there something she can do at home to bring it up. She checked her BP sitting at 1615 and it was 91/56. I asked her to stand x 1 minute and it was 102/60, P 88. I advised her to go to the UC due to the low BP and dizziness, patient verbalized understanding.  Reason for Disposition . [9] Systolic BP 93-570 AND [1] taking blood pressure medications AND [3] dizzy, lightheaded or weak  Answer Assessment - Initial Assessment Questions 1. BLOOD PRESSURE: "What is the blood pressure?" "Did you take at least two measurements 5 minutes apart?"     87/65, 91/54 2. ONSET: "When did you take your blood pressure?"     1545 and 1600 3. HOW: "How did you obtain the blood pressure?" (e.g., visiting nurse, automatic home BP monitor)     Automatic home BP monitor upper arm 4. HISTORY: "Do you have a history of low blood pressure?" "What is your blood pressure normally?"     Yes 5. MEDICATIONS: "Are you taking any medications for blood pressure?" If yes: "Have they been changed recently?"     No 6. PULSE RATE: "Do you know what your pulse rate is?"      70's 7. OTHER SYMPTOMS: "Have you been sick recently?" "Have you had a recent injury?"     No sickness 8. PREGNANCY: "Is there any chance you are pregnant?" "When was your last menstrual period?"     No  Answer Assessment - Initial Assessment Questions 1. DESCRIPTION: "Describe your dizziness."     Weak, lightheaded 2. LIGHTHEADED: "Do you feel lightheaded?" (e.g., somewhat faint, woozy, weak upon standing)    Yes 3. VERTIGO: "Do you feel like either you or the room is spinning or tilting?" (i.e. vertigo)     No, just a way type feeling 4. SEVERITY: "How bad is it?"  "Do you feel like  you are going to faint?" "Can you stand and walk?"   - MILD - walking normally   - MODERATE - interferes with normal activities (e.g., work, school)    - SEVERE - unable to stand, requires support to walk, feels like passing out now.      Moderate 5. ONSET:  "When did the dizziness begin?"     Today around 2 pm 6. AGGRAVATING FACTORS: "Does anything make it worse?" (e.g., standing, change in head position)     Changing head position, standing 7. HEART RATE: "Can you tell me your heart rate?" "How many beats in 15 seconds?"  (Note: not all patients can do this)       70's 8. CAUSE: "What do you think is causing the dizziness?"     My BP being low 9. RECURRENT SYMPTOM: "Have you had dizziness before?" If so, ask: "When was the last time?" "What happened that time?"     Yes 10. OTHER SYMPTOMS: "Do you have any other symptoms?" (e.g., fever, chest pain, vomiting, diarrhea, bleeding)     Chest pain on the left side above breast since 2 pm about a 3 on the pain scale 11. PREGNANCY: "Is there any chance you are pregnant?" "When was your last menstrual period?"  No  Protocols used: LOW BLOOD PRESSURE-A-AH, DIZZINESS - LIGHTHEADEDNESS-A-AH

## 2019-03-30 ENCOUNTER — Other Ambulatory Visit: Payer: Self-pay | Admitting: Internal Medicine

## 2019-03-30 ENCOUNTER — Telehealth: Payer: Self-pay | Admitting: Internal Medicine

## 2019-03-30 DIAGNOSIS — E1165 Type 2 diabetes mellitus with hyperglycemia: Secondary | ICD-10-CM

## 2019-03-30 DIAGNOSIS — E118 Type 2 diabetes mellitus with unspecified complications: Secondary | ICD-10-CM

## 2019-03-30 MED ORDER — INSULIN DETEMIR 100 UNIT/ML ~~LOC~~ SOLN: 30.0000 [IU] | Freq: Every day | SUBCUTANEOUS | 3 refills | Status: DC

## 2019-03-30 NOTE — Telephone Encounter (Signed)
Copied from [place] [id number]. Topic: Quick Communication - Rx Refill/Question >> Mar 30, 2019 10:34 AM Loma [place] wrote: insulin detemir (LEVEMIR) 100 UNIT/ML injection  Danielle from [facility] says pt needs refill, please send current updated script, increase in amt ? [id number] [place], [place] [address] [facility] [address] [phone] (Phone) [phone] (Fax)

## 2019-04-06 ENCOUNTER — Encounter: Payer: PRIVATE HEALTH INSURANCE | Attending: Psychology | Admitting: Psychology

## 2019-04-06 ENCOUNTER — Other Ambulatory Visit: Payer: Self-pay

## 2019-04-06 DIAGNOSIS — R413 Other amnesia: Secondary | ICD-10-CM | POA: Diagnosis not present

## 2019-04-06 DIAGNOSIS — I10 Essential (primary) hypertension: Secondary | ICD-10-CM | POA: Insufficient documentation

## 2019-04-06 NOTE — Progress Notes (Addendum)
Patient:  Ashley [name]   DOB: 08-31-1966  MR Number: [id number]  Location: [facility] [place], [id number] [place] [address] Dept: [phone]  Start: 1 PM End: 2 PM  Provider/Observer:     Edgardo Roys PsyD  Chief Complaint:      Chief Complaint  Patient presents with   Memory Loss   Other    Confusion, dizziness, and paraphasic/expressive language difficulties reported.    Reason For Service:      Tashanda B. Zumstein is a 53 year old Caucasian female who was referred by Dr. Jaynee Eagles for neuropsychological evaluation due to the development of memory difficulties, expressive language changes and disorientation at times.  The patient reports that she started noticing memory issues about 6 months ago and initially was just minor issues.  Around 4 months ago the patient reports that her daughter started noticing the patient calling the dog by the wrong name and using a dog name that they had had 10 years prior.  Other expressive language issues are noted including difficulty completing sentences.  The patient reports that her husband is telling her that he noticed things including the patient getting disoriented at times.  The patient describes problems with executive functioning and problem-solving.  The patient reports that there are times when she is disoriented and confused.  The patient reports that she will get very dizzy if she stands for long periods of time and become very forgetful and losing her train of thought.  The patient reports that she fell in August and hit her head but all of the symptoms were present before this possible concussive event and she reports that she does not notice any change after this event.  The patient has a history of chronic migraines, syncopal events, and neuropathy in her feet and legs.  The patient also experiences neck and back pain  as well.  The patient describes significant disturbance in sleep and that she averages about 4 hours per night when she takes trazodone and amitriptyline and reports that she will not sleep at all if she does not take these medications.  The patient reports that her grandmother was diagnosed with Alzheimer's when the grandmother was in her early 85s.  The grandmother lived for 4 years after the initial symptoms and died 2 years after being diagnosed with Alzheimer's.  The grandmother developed significant personality changes and became very aggressive and had tremors as well and the symptoms described by the patient sound more consistent with frontotemporal dementia than Alzheimer's.  The patient had a head CT exam in 2019 and the findings showed no evidence of acute hemorrhage, hydrocephalus, mass lesion or abnormal findings and it was deemed to be a normal CT scan.  Prior to this CT scan the patient had a number of CT scans as well as one MRI conducted in 2018 because of concerns about stroke events.  All of the CT scans and MRI scan were indicative of normal brain findings with no intracranial abnormalities identified in either the CT scans of the MRI scans previously.  Review of medical issues through the years include the patient being diagnosed with anxiety state, migraine with aura, tobacco abuse, somatization disorder, diabetes with neuropathy, moderate episode of recurrent major depressive disorder, thiamine deficiency, hypertension, lumbar radiculitis, and gastroesophageal reflux disease with esophagitis among others medical issues.   Testing Administered:  The patient was administered the Wechsler Adult Intelligence  Scale-IV as well as the Wechsler Memory Scale-IV.  Participation Level:   Active  Participation Quality:  Appropriate and Attentive      Behavioral Observation:  Well Groomed, Alert, and Appropriate.   The test battery was administered by Joelyn Oms, Psy.D. on  03/16/2019.  Below are the behavioral observations and mental status exam completed by him during the administration of this test battery.  Mental Status Examination & Behavior Observations: The patient arrived on time to her 8:00am testing appointment. She was administered the Wechsler Adult Intelligence Scale, 4th Edition, and Wechsler Memory Scale, 4th Edition (Adult Battery). The evaluation lasted 240 minutes. She was alert and fully oriented. She was casually dressed and appeared to have adequate hygiene. Her spontaneous speech was clear and prosodic with normal rate, tone, and volume. She ambulated independently without difficulty. Mood was mildly anxious with appropriate and congruent affect. Thought processes appeared mostly organized and logical. Insight and Judgement were fair.   She was cooperative with all assigned tasks and appeared to give good effort. She did not any difficulty hearing or understanding test instructions and did not require much additional prompting. She exhibited good frustration tolerance on questions she did not know or tasks that were more difficult and persisted well throughout testing. (e.g. did not require many breaks).   Test Results:   The patient appeared to try her hardest throughout this testing procedure and appeared to give full effort throughout.  This does appear to be a fair and valid assessment of her current cognitive and memory functions.  Below are the interpretations in summary of the current evaluation results.   Composite Score Summary  Scale Sum of Scaled Scores Composite Score Percentile Rank 95% Conf. Interval Qualitative Description  Verbal Comprehension 32 VCI 103 58 97-109 Average  Perceptual Reasoning 22 PRI 84 14 79-91 Low Average  Working Memory 17 WMI 92 30 86-99 Average  Processing Speed 11 PSI 76 5 70-87 Borderline  Full Scale 82 FSIQ 87 19 83-91 Low Average  General Ability 54 GAI 94 34 89-99 Average   The patient produced a  full scale IQ score of 87 which falls at the 19th percentile and is in the low average range of functioning.  The patient's general abilities index score which puts less weight on auditory working memory and information processing speed produced a GA I of 94 which falls at the 34th percentile and is in the average range.  The patient performed her best on verbal comprehension skills producing a verbal comprehension index score of 103 which falls at the 58th percentile and is in the average range.  Perceptual reasoning index score was 84 which falls at the 14th percentile and is in the low average range of functioning.  The patient is auditory working memory was 52 which falls at the 30th percentile and is in the average range.  The patient showed her greatest impairments having to do with information processing speed which produced in information processing speed index score of 76 and fell in the 5th percentile and at the borderline range of functioning.   Verbal Comprehension Subtests Summary  Subtest Raw Score Scaled Score Percentile Rank Reference Group Scaled Score SEM  Similarities 28 11 63 11 1.04  Vocabulary 39 10 50 11 0.73  Information 16 11 63 12 0.73  (Comprehension) 24 9 37 10 1.16   The patient produced a verbal comprehension index score of 103 which falls at the 50th percentile and was in the average range overall.  Individual subtest making up this measure were all in the average range and quite consistent with each other.  The patient did well with regard to her verbal reasoning and problem-solving abilities, her vocabulary skills and word knowledge, her general fund of information and world knowledge, as well as her social judgment and comprehension abilities.  Again, all subtest fell in the average range of functioning and were consistent with her overall level of education and occupational history.    Perceptual Reasoning Subtests Summary  Subtest Raw Score Scaled Score Percentile  Rank Reference Group Scaled Score SEM  Block Design 28 7 16 6  0.95  Matrix Reasoning 10 6 9 5  0.95  Visual Puzzles 12 9 37 8 0.85  (Figure Weights) 10 7 16 7  0.90  (Picture Completion) 13 10 50 9 1.24   The patient produced a perceptual reasoning index score of 84 which falls at the 14th percentile and was in the low average range.  This is significantly below her verbal comprehension index score and suggest some difficulty with regard to visual spatial and visual analysis abilities.  The patient had difficulty with regard to visual analysis and organization, some measures of visual reasoning and problem-solving and estimation and judgment.  The patient did well on visual problem solving as well as the ability to identify anomalies within a visual gestalt.  There was considerable variability on subtest performance in this domain.  Working Doctor, general practice Raw Score Scaled Score Percentile Rank Reference Group Scaled Score SEM  Digit Span 25 9 37 8 0.73  Arithmetic 12 8 25 9  0.90  (Letter-Number Seq.) 19 9 37 9 1.04   The patient produced a working memory index score of 92 which falls at the 30th percentile and is in the average range.  Her scores are generally consistent with estimations based on her verbal comprehension skills as well as her general educational occupational history.  The patient performed in the average range with regard to auditory encoding, auditory processing of information, and multi processing abilities.    Processing Speed Subtests Summary  Subtest Raw Score Scaled Score Percentile Rank Reference Group Scaled Score SEM  Symbol Search 20 6 9 5  1.56  Coding 41 5 5 5  1.20  (Cancellation) 33 8 25 7  1.62   The patient produced a processing speed index score of 76 which falls at the 5th percentile and is in the borderline range of functioning.  The patient had some degree of difficulty to significant deficits with regard to her visual scanning and visual  searching abilities, focus execute abilities as well as overall speed of mental operations.  This clearly suggest that the patient is having difficulty with information processing speed.   Index Score Summary  Index Sum of Scaled Scores Index Score Percentile Rank 95% Confidence Interval Qualitative Descriptor  Auditory Memory (AMI) 49 113 81 106-119 High Average  Visual Memory (VMI) 23 74 4 69-81 Borderline  Visual Working Memory (VWMI) 16 88 21 82-96 Low Average  Immediate Memory (IMI) 32 86 18 80-93 Low Average  Delayed Memory (DMI) 40 100 50 93-107 Average    The patient produced a visual working memory index score of 88 with falls at the 21st percentile and is in the low average range of functioning.  This is just below but generally consistent with her auditory working memory/auditory encoding abilities.  The patient produced an immediate memory index score which combines both auditory and visual memory get together of 86 which falls  at the 18th percentile and is in the low average range of functioning.  The patient produced a delayed memory index score of 100 which falls at the 50th percentile and is in the average range.  This pattern clearly suggest that if the patient initially is able to encode the information either auditorily or visual and does successfully store it that she has adequately retaining that information after period of delay.  When we breakdown auditory versus visual memory the patient produced an auditory memory index score of 113 which falls at the 81st percentile and is in the high average range.  This is in marked contrast to her visual memory index score which was a 74 and fell at the 4th percentile and is in the borderline range of functioning.  This is a 39 point difference and is significant at the 0.01 level of significance with significant impairments in her visual memory.  ABILITY-MEMORY ANALYSIS  Ability Score:  VCI: 103 Date of Testing:  WAIS-IV; WMS-IV  2019/03/16  Predicted Difference Method   Index Predicted WMS-IV Index Score Actual WMS-IV Index Score Difference Critical Value  Significant Difference Y/N Base Rate  Auditory Memory 102 113 -11 9.43 Y 20%  Visual Memory 101 74 27 9.19 Y 2-3%  Visual Working Memory 102 88 14 11.15 Y 10-15%  Immediate Memory 102 86 16 10.27 Y 10%  Delayed Memory 102 100 2 10.03 N   Statistical significance (critical value) at the .01 level.   To further analyze the patient's memory abilities we utilize the abilities-memory analysis function by producing a predictive score based on her verbal comprehension index score from the Wechsler Adult Intelligence Scale.  She produced a V CI of 103 and we utilize this number to predict where she should be on various memory task.  The patient produced performances that were consistent with predicted levels or exceeded predicted levels for her auditory memory abilities as well as her delayed memory abilities.  The patient showed significant deficits relative to predicted levels on both her visual memory, her visual working memory, as well as her immediate memory functions.  Again, however, the patient's delayed memory was quite good and right where was to be predicted and if information was initially stored it was available for later retrieval.   Summary of Results:   Overall, the results of the current neuropsychological evaluation are consistent with some objective findings of difficulties with regard to visual spatial/visual reasoning abilities, overall mental processing/information processing speed (focus execute abilities), and specific issues with visual memory and learning.  However, much of her other areas of cognitive functioning were in the average range and consistent with what would be predicted based on her education and occupational history.  For example, the patient did well with regard to verbal comprehension abilities including verbal reasoning and  problem-solving abilities, vocabulary, general fund of information, and social judgment and comprehension abilities.  The patient also did well on some types of visual-spatial abilities including the ability to identify anomalies within a visual gestalt.  The patient did adequately and only slightly below predicted levels with regard to auditory and visual working memories with auditory encoding doing better.  While the patient showed some mild difficulties with regard to immediate learning and memory almost all these difficulties had to do with her visual memory abilities.  The patient did very well with regard to delayed memory both auditorily and visually.  This pattern strongly suggest that once information is encoded and stored that it is available  for retrieval later.  This suggest that this is more of an encoding difficulty than an actual organization or storage and retrieval deficit.  Impression/Diagnosis:   Overall, the results of the current neuropsychological evaluation are not consistent with patterns typically identified are seen with degenerative cortical dementia such as Alzheimer's and do have a heavy loading on mild to moderate difficulties for right hemispheric function versus left hemispheric function.  There does appear to be some acute nature to some of her difficulties particularly with her falls and difficulty with ambulation and there may be a vascular component to this related to her migraine history.  The patient is shown no signs of stroke through multiple CT scans and at least one MRI scan.  The patient also has a history of anxiety and hyperfocus on her abilities and this anxiety may be causing some of the issues with her expressive language abilities.  While the overall results are not consistent with any cortically mediated degenerative dementia it is possible that we are very early in this process.  If the patient does continue to deteriorate and report increasing problems and  difficulties that could not be explained by acute vascular changes it would be appropriate to reevaluate her in approximately 1 year to assess for progressive changes and further rule out the possibility of any type of degenerative condition.  The patient did show some clear deficits with regard to overall information processing speed as well as visual learning and memory and visual-spatial abilities and this does have a differential loading on right hemisphere versus left hemisphere functioning.  Besides possible issues related to acute vascular events around her migraine events it is also possible that her history of thiamine deficiency could be theoretically playing a role in her memory issues although this is being addressed more acutely and the thiamine deficiency was very mild and identified a year ago.  I will provide feedback regarding the results of the current neuropsychological evaluation.  If you have any further questions regarding this evaluation please feel free to contact me at any time.  Diagnosis:    Axis I: Memory loss  Essential hypertension   Ilean Skill, Psy.D. Neuropsychologist            WAIS-IV Wechsler Adult Intelligence Scale-Fourth Edition Score Report   Examinee Name Jaquel Coomer  Date of Report 2019/04/06  Justice Rocher [id number]  Years of Education   Date of Birth Sep 07, 1966  Primary Language   Gender Female  Handedness   Race/Ethnicity   Examiner Name Ilean Skill  Date of Testing 2019/03/16  Age at Testing 60 years 2 months  Retest? No   Comments: Composite Score Summary  Scale Sum of Scaled Scores Composite Score Percentile Rank 95% Conf. Interval Qualitative Description  Verbal Comprehension 32 VCI 103 58 97-109 Average  Perceptual Reasoning 22 PRI 84 14 79-91 Low Average  Working Memory 17 WMI 92 30 86-99 Average  Processing Speed 11 PSI 76 5 70-87 Borderline  Full Scale 82 FSIQ 87 19 83-91 Low Average  General Ability 54 GAI 94  34 89-99 Average  Confidence Intervals are based on the Overall Average SEMs. The [facility] is an optional composite summary score that is less sensitive to the influence of working memory and processing speed. Because working memory and processing speed are vital to a comprehensive evaluation of cognitive ability, it should be noted that the [facility] does not have the breadth of construct coverage as the FSIQ.    ANALYSIS   Index Level  Discrepancy Comparisons  Comparison Score 1 Score 2 Difference Critical Value .05 Significant Difference Y/N Base Rate by Overall Sample  VCI - PRI 103 84 19 7.78 Y 8.0  VCI - WMI 103 92 11 8.31 Y 19.7  VCI - PSI 103 76 27 11.76 Y 4.9  PRI - WMI 84 92 -8 8.81 N 28.9  PRI - PSI 84 76 8 12.12 N 30.1  WMI - PSI 92 76 16 12.47 Y 14.9  FSIQ - GAI 87 94 -7 3.29 Y 8.1  Base Rate by Overall Sample. Statistical significance (critical value) at the .05 level.  Verbal Comprehension Subtests Summary  Subtest Raw Score Scaled Score Percentile Rank Reference Group Scaled Score SEM  Similarities 28 11 63 11 1.04  Vocabulary 39 10 50 11 0.73  Information 16 11 63 12 0.73  (Comprehension) 24 9 37 10 1.16   Perceptual Reasoning Subtests Summary  Subtest Raw Score Scaled Score Percentile Rank Reference Group Scaled Score SEM  Block Design 28 7 16 6  0.95  Matrix Reasoning 10 6 9 5  0.95  Visual Puzzles 12 9 37 8 0.85  (Figure Weights) 10 7 16 7  0.90  (Picture Completion) 13 10 50 9 1.24   Working Doctor, general practice Raw Score Scaled Score Percentile Rank Reference Group Scaled Score SEM  Digit Span 25 9 37 8 0.73  Arithmetic 12 8 25 9  0.90  (Letter-Number Seq.) 19 9 37 9 1.04   Processing Speed Subtests Summary  Subtest Raw Score Scaled Score Percentile Rank Reference Group Scaled Score SEM  Symbol Search 20 6 9 5  1.56  Coding 41 5 5 5  1.20  (Cancellation) 33 8 25 7  1.62   Subtest Level Discrepancy Comparisons  Subtest Comparison Score 1 Score 2  Difference Critical Value .05 Significant Difference Y/N Base Rate  Digit Span - Arithmetic 9 8 1  2.57 N 42.80  Symbol Search - Coding 6 5 1  3.41 N 42.60  Statistical significance (critical value) at the .05 level.   DETERMINING STRENGTHS AND WEAKNESSES   Differences Between Subtest and Overall Mean of Subtest Scores  Subtest Subtest Scaled Score Mean Scaled Score Difference Critical Value .05 Strength or Weakness Base Rate  Block Design 7 8.20 -1.20 2.85  >25%  Similarities 11 8.20 2.80 2.82  15%  Digit Span 9 8.20 0.80 2.22  >25%  Matrix Reasoning 6 8.20 -2.20 2.54  >25%  Vocabulary 10 8.20 1.80 2.03  >25%  Arithmetic 8 8.20 -0.20 2.73  >25%  Symbol Search 6 8.20 -2.20 3.42  >25%  Visual Puzzles 9 8.20 0.80 2.71  >25%  Information 11 8.20 2.80 2.19 S 15-25%  Coding 5 8.20 -3.20 2.97 W 15-25%  Overall: Mean = 8.20, Scatter =  6, Base rate =  68.4 Base Rate for Intersubtest Scatter is reported for 10 Subtests. Statistical significance (critical value) at the .05 level.   PROCESS ANALYSIS   Perceptual Reasoning Process Score Summary  Process Score Raw Score Scaled Score Percentile Rank SEM  Block Design No Time Bonus 28 7 16  1.08    Working Memory Process Score Summary  Process Score Raw Score Scaled Score Percentile Rank Base Rate SEM  Digit Span Forward 9 9 37 -- 1.24  Digit Span Backward 8 9 37 -- 1.12  Digit Span Sequencing 8 9 37 -- 1.27  Longest Digit Span Forward 6 -- -- 79.0 --  Longest Digit Span Backward 4 -- -- 82.0 --  Longest Digit Span Sequence  6 -- -- 66.0 --  Longest Letter-Number Sequence 5 -- -- 76.0 --    Process Level Discrepancy Comparisons  Process Comparison Score 1 Score 2 Difference Critical Value .05 Significant Difference Y/N Base Rate  Block Design - Block Design No Time Bonus 7 7 0 3.08 N   Digit Span Forward - Digit Span Backward 9 9 0 3.65 N   Digit Span Forward - Digit Span Sequencing 9 9 0 3.60 N   Digit Span Backward  - Digit Span Sequencing 9 9 0 3.56 N   Longest Digit Span Forward - Longest Digit Span Backward 6 4 2  -- -- 61.0  Longest Digit Span Forward - Longest Digit Span Sequence 6 6 0 -- --   Longest Digit Span Backward - Longest Digit Span Sequence 4 6 -2 -- -- 37.5  Statistical significance (critical value) at the .05 level.   Raw Scores  Subtest Score Range Raw Score  Process Score Range Raw Score  Block Design 0-66 28  Block Design No Time Bonus 0-48 28  Similarities 0-36 28  Digit Span Forward 0-16 9  Digit Span 0-48 25  Digit Span Backward 0-16 8  Matrix Reasoning 0-26 10  Digit Span Sequencing 0-16 8  Vocabulary 0-57 39  Longest Digit Span Forward 0, 2-9 6  Arithmetic 0-22 12  Longest Digit Span Backward 0, 2-8 4  Symbol Search 0-60 20  Longest Digit Span Sequence 0, 2-9 6  Visual Puzzles 0-26 12  Longest Letter-Number Seq. 0, 2-8 5  Information 0-26 16      Coding 0-135 41      Letter-Number Seq. 0-30 19      Figure Weights 0-27 10      Comprehension 0-36 24      Cancellation 0-72 33      Picture Completion 0-24 13         WMS-IV Wechsler Memory Scale-Fourth Edition Score Report   Examinee Name Shalonda Sachse  Date of Report 2019/04/06  Kingsley Spittle ID [id number]  Years of Education 12  Date of Birth [birth year]  Home Language   Gender Female  Handedness Not Specified  Race/Ethnicity   Examiner Name Ilean Skill  Date of Testing 2019/03/16  Age at Testing 89 years 2 months  Retest? No   Comments: Brief Cognitive Status Exam Classification  Age Years of Education Raw Score Classification Level Base Rate  53 years 2 months 12 51 Average 100.0    Index Score Summary  Index Sum of Scaled Scores Index Score Percentile Rank 95% Confidence Interval Qualitative Descriptor  Auditory Memory (AMI) 49 113 81 106-119 High Average  Visual Memory (VMI) 23 74 4 69-81 Borderline  Visual Working Memory (VWMI) 16 88 21 82-96 Low Average  Immediate Memory (IMI) 32 86 18 80-93 Low  Average  Delayed Memory (DMI) 40 100 50 93-107 Average    Index Score Profile  The vertical bars represent the standard error of measurement (SEM).   Primary Subtest Scaled Score Summary  Subtest Domain Raw Score Scaled Score Percentile Rank  Logical Memory I AM 32 13 84  Logical Memory II AM 30 13 84  Verbal [employer] Associates I AM 37 11 63  Verbal [employer] II AM 12 12 75  Designs I VM 39 3 1  Designs II VM 45 8 25  Visual Reproduction I VM 25 5 5   Visual Reproduction II VM 13 7 16   Spatial Addition VWM 11 9 37  Symbol Span VWM 16  7 16   Primary Subtest Scaled Score Profile    PROCESS SCORE CONVERSIONS  Auditory Memory Process Score Summary  Process Score Raw Score Scaled Score Percentile Rank Cumulative Percentage (Base Rate)  LM II Recognition 29 - - >75%  VPA II Recognition 40 - - >75%   Visual Memory Process Score Summary  Process Score Raw Score Scaled Score Percentile Rank Cumulative Percentage (Base Rate)  DE I Content 26 5 5  -  DE I Spatial 9 4 2  -  DE II Content 26 7 16  -  DE II Spatial 11 9 37 -  DE II Recognition 16 - - 51-75%  VR II Recognition 5 - - 26-50%    SUBTEST-LEVEL DIFFERENCES WITHIN INDEXES  Auditory Memory Index  Subtest Scaled Score AMI Mean Score Difference from Mean Critical Value Base Rate  Logical Memory I 13 12.25 0.75 2.64 >25%  Logical Memory II 13 12.25 0.75 2.48 >25%  [facility] I 11 12.25 -1.25 1.90 >25%  [facility] II 12 12.25 -0.25 2.48 >25%  Statistical significance (critical value) at the .05 level.   Visual Memory Index  Subtest Scaled Score VMI Mean Score Difference from Mean Critical Value Base Rate  Designs I 3 5.75 -2.75 2.38 10%  Designs II 8 5.75 2.25 2.38 15-25%  Visual Reproduction I 5 5.75 -0.75 1.86 >25%  Visual Reproduction II 7 5.75 1.25 1.48 >25%  Statistical significance (critical value) at the .05 level.   Immediate Memory Index  Subtest Scaled Score IMI  Mean Score Difference from Mean Critical Value Base Rate  Logical Memory I 13 8.00 5.00 2.59 2%  [facility] I 11 8.00 3.00 1.82 15%  Designs I 3 8.00 -5.00 2.42 1-2%  Visual Reproduction I 5 8.00 -3.00 1.91 15%  Statistical significance (critical value) at the .05 level.   Delayed Memory Index  Subtest Scaled Score DMI Mean Score Difference from Mean Critical Value Base Rate  Logical Memory II 13 10.00 3.00 2.44 15-25%  [facility] II 12 10.00 2.00 2.44 >25%  Designs II 8 10.00 -2.00 2.44 >25%  Visual Reproduction II 7 10.00 -3.00 1.57 15%  Statistical significance (critical value) at the .05 level.    SUBTEST DISCREPANCY COMPARISON  Comparison Score 1 Score 2 Difference Critical Value Base Rate  Spatial Addition - Symbol Span 9 7 2  2.74 62.1  Statistical significance (critical value) at the .05 level.    SUBTEST-LEVEL CONTRAST SCALED SCORES  Logical Memory  Score Score 1 Score 2 Contrast Scaled Score  LM II Recognition vs. Delayed Recall >75% 13 11  LM Immediate Recall vs. Delayed Recall 13 13 11    [facility]  Score Score 1 Score 2 Contrast Scaled Score  VPA II Recognition vs. Delayed Recall >75% 12 10  VPA Immediate Recall vs. Delayed Recall 11 12 11    Designs  Score Score 1 Score 2 Contrast Scaled Score  DE I Spatial vs. Content 4 5 8   DE II Spatial vs. Content 9 7 8   DE II Recognition vs. Delayed Recall 51-75% 8 7  DE Immediate Recall vs. Delayed Recall 3 8 15    Visual Reproduction  Score Score 1 Score 2 Contrast Scaled Score  VR II Recognition vs. Delayed Recall 26-50% 7 7  VR Immediate Recall vs. Delayed Recall 5 7 10     INDEX-LEVEL CONTRAST SCALED SCORES  WMS-IV Indexes  Score Score 1 Score 2 Contrast Scaled Score  Auditory Memory Index vs. Visual Memory  Index 113 74 3  Visual Working Memory Index vs. Visual Memory Index 88 74 6  Immediate Memory Index vs. Delayed Memory Index 86 100 14    RAW SCORES  Subtest  Score Range Adult Score Range Older Adult Raw Score  Brief Cognitive Status Exam 0-58 0-58 51  Logical Memory I 0-50 0-53 32  Logical Memory II 0-50 0-39 30  [facility] I 0-56 0-40 37  [facility] II 0-14 0-10 12  CVLT-II Trials 1-5 5-95 5-95   CVLT-II Long-Delay -5-5 -5-5   Designs I 0-120  39  Designs II 0-120  45  Visual Reproduction I 0-43 0-43 25  Visual Reproduction II 0-43 0-43 13  Spatial Addition 0-24  11  Symbol Span 0-50 0-50 16  Process Score Range Adult Score Range Older Adult Raw Score  LM II Recognition 0-30 0-23 29  VPA II Recognition 0-40 0-30 40  VPA II Word Recall 0-28 0-20   DE I Content 0-48  26  DE I Spatial 0-24  9  DE II Content 0-48  26  DE II Spatial 0-24  11  DE II Recognition 0-24  16  VR II Recognition 0-7 0-7 5  VR II Copy 0-43 0-43     ABILITY-MEMORY ANALYSIS  Ability Score:  VCI: 103 Date of Testing:  WAIS-IV; WMS-IV 2019/03/16  Predicted Difference Method   Index Predicted WMS-IV Index Score Actual WMS-IV Index Score Difference Critical Value  Significant Difference Y/N Base Rate  Auditory Memory 102 113 -11 9.43 Y 20%  Visual Memory 101 74 27 9.19 Y 2-3%  Visual Working Memory 102 88 14 11.15 Y 10-15%  Immediate Memory 102 86 16 10.27 Y 10%  Delayed Memory 102 100 2 10.03 N   Statistical significance (critical value) at the .01 level.   Contrast Scaled Scores  Score Score 1 Score 2 Contrast Scaled Score  General Ability Index vs. Auditory Memory Index 94 113 14  General Ability Index vs. Visual Memory Index 94 74 5  General Ability Index vs. Visual Working Memory Index 94 88 8  General Ability Index vs. Immediate Memory Index 94 86 8  General Ability Index vs. Delayed Memory Index 94 100 11  Verbal Comprehension Index vs. Auditory Memory Index 103 113 13  Perceptual Reasoning Index vs. Visual Memory Index 84 74 6  Perceptual Reasoning Index vs. Visual Working Memory Index 84 88 11  Working  Memory Index vs. Auditory Memory Index 92 113 14  Working Memory Index vs. Visual Working Memory Index 92 88 8    End of Report

## 2019-04-07 ENCOUNTER — Other Ambulatory Visit: Payer: Self-pay | Admitting: Internal Medicine

## 2019-04-14 ENCOUNTER — Ambulatory Visit: Payer: Self-pay

## 2019-04-14 NOTE — Telephone Encounter (Signed)
Incoming call from Patient reporting that her blood pressure reports the following   are low.   96/47 HR81  and 86/50 HR84 onset was around 2pm today.  Obtained via automatic machine.  Normally runs 116/to 120/ 70's. Has had a smokers cough.  Reviewed protocol and provided care advise with Patient, which recommends Patient go to ED or Urgent care for further evaluation. Patient voiced understanding.   Reason for Disposition . [1] Systolic BP < 90 AND [5] dizzy, lightheaded, or weak  Answer Assessment - Initial Assessment Questions 1. BLOOD PRESSURE: "What is the blood pressure?" "Did you take at least two measurements 5 minutes apart?"     96/47 HR 8186/50 HR 84 2. ONSET: "When did you take your blood pressure?"     2pm today 3. HOW: "How did you obtain the blood pressure?" (e.g., visiting nurse, automatic home BP monitor)     automatic 4. HISTORY: "Do you have a history of low blood pressure?" "What is your blood pressure normally?"      5. MEDICATIONS: "Are you taking any medications for blood pressure?" If yes: "Have they been changed recently?"     *No Answer* 6. PULSE RATE: "Do you know what your pulse rate is?"      *No Answer* 7. OTHER SYMPTOMS: "Have you been sick recently?" "Have you had a recent injury?"     Had a cough  Smoker cough 8. PREGNANCY: "Is there any chance you are pregnant?" "When was your last menstrual period?"     denies  Protocols used: LOW BLOOD PRESSURE-A-AH

## 2019-04-15 NOTE — Telephone Encounter (Signed)
lvm for pt to call back.  RE: Reviewed chart and triage note from yesterday. Calling to see how she was feeling today and to offer an appointment in office if she needed assistance. Pt was advised to go to ED but it does not appear that she has.

## 2019-04-15 NOTE — Telephone Encounter (Incomplete)
Incoming call from Patient reporting that her blood pressure reports the following   are low.   96/47 HR81  and 86/50 HR84 onset was around 2pm today.  Obtained via automatic machine.  Normally runs 116/to 120/ 70's. Has had a smokers cough.  Reviewed protocol and provided care advise with Patient, which recommends Patient go to ED or Urgent care for further evaluation. Patient voiced understanding.    Reason for Disposition . [3] Systolic BP < 90 AND [9] dizzy, lightheaded, or weak  Answer Assessment - Initial Assessment Questions 1. BLOOD PRESSURE: "What is the blood pressure?" "Did you take at least two measurements 5 minutes apart?"     96/47 HR 8186/50 HR 84 2. ONSET: "When did you take your blood pressure?"     2pm today 3. HOW: "How did you obtain the blood pressure?" (e.g., visiting nurse, automatic home BP monitor)     automatic 4. HISTORY: "Do you have a history of low blood pressure?" "What is your blood pressure normally?"      5. MEDICATIONS: "Are you taking any medications for blood pressure?" If yes: "Have they been changed recently?"     *No Answer* 6. PULSE RATE: "Do you know what your pulse rate is?"      *No Answer* 7. OTHER SYMPTOMS: "Have you been sick recently?" "Have you had a recent injury?"     Had a cough  Smoker cough 8. PREGNANCY: "Is there any chance you are pregnant?" "When was your last menstrual period?"     denies  Protocols used: LOW BLOOD PRESSURE-A-AH

## 2019-04-15 NOTE — Telephone Encounter (Signed)
I have pt scheduled to see PCP on Thursday at [facility].   Pt stated that he BP is doing better and that she did not want to go to the UC or ED.

## 2019-04-16 ENCOUNTER — Other Ambulatory Visit (INDEPENDENT_AMBULATORY_CARE_PROVIDER_SITE_OTHER): Payer: PRIVATE HEALTH INSURANCE

## 2019-04-16 ENCOUNTER — Ambulatory Visit (INDEPENDENT_AMBULATORY_CARE_PROVIDER_SITE_OTHER): Payer: PRIVATE HEALTH INSURANCE | Admitting: Internal Medicine

## 2019-04-16 ENCOUNTER — Encounter: Payer: Self-pay | Admitting: Internal Medicine

## 2019-04-16 ENCOUNTER — Other Ambulatory Visit: Payer: Self-pay

## 2019-04-16 VITALS — BP 116/82 | HR 79 | Temp 97.8°F | Resp 16 | Ht 62.0 in | Wt 172.5 lb

## 2019-04-16 DIAGNOSIS — E118 Type 2 diabetes mellitus with unspecified complications: Secondary | ICD-10-CM

## 2019-04-16 DIAGNOSIS — I10 Essential (primary) hypertension: Secondary | ICD-10-CM

## 2019-04-16 DIAGNOSIS — I959 Hypotension, unspecified: Secondary | ICD-10-CM | POA: Diagnosis not present

## 2019-04-16 LAB — BASIC METABOLIC PANEL
BUN: 14 mg/dL (ref 6–23)
CO2: 25 mEq/L (ref 19–32)
Calcium: 9 mg/dL (ref 8.4–10.5)
Chloride: 102 mEq/L (ref 96–112)
Creatinine, Ser: 0.89 mg/dL (ref 0.40–1.20)
GFR: 66.28 mL/min (ref >60.00)
Glucose, Bld: 211 mg/dL — ABNORMAL HIGH (ref 70–99)
Potassium: 4.1 mEq/L (ref 3.5–5.1)
Sodium: 138 mEq/L (ref 135–145)

## 2019-04-16 LAB — HEMOGLOBIN A1C: Hgb A1c MFr Bld: 7.8 % — ABNORMAL HIGH (ref 4.6–6.5)

## 2019-04-16 LAB — CORTISOL: Cortisol, Plasma: 10 ug/dL

## 2019-04-16 NOTE — Patient Instructions (Signed)
Type 2 Diabetes Mellitus, Diagnosis, Adult Type 2 diabetes (type 2 diabetes mellitus) is a long-term (chronic) disease. In type 2 diabetes, one or both of these problems may be present:  The pancreas does not make enough of a hormone called insulin.  Cells in the body do not respond properly to insulin that the body makes (insulin resistance). Normally, insulin allows blood sugar (glucose) to enter cells in the body. The cells use glucose for energy. Insulin resistance or lack of insulin causes excess glucose to build up in the blood instead of going into cells. As a result, high blood glucose (hyperglycemia) develops. What increases the risk? The following factors may make you more likely to develop type 2 diabetes:  Having a family member with type 2 diabetes.  Being overweight or obese.  Having an inactive (sedentary) lifestyle.  Having been diagnosed with insulin resistance.  Having a history of prediabetes, gestational diabetes, or polycystic ovary syndrome (PCOS).  Being of American-Indian, African-American, Hispanic/Latino, or Asian/Pacific Islander descent. What are the signs or symptoms? In the early stage of this condition, you may not have symptoms. Symptoms develop slowly and may include:  Increased thirst (polydipsia).  Increased hunger(polyphagia).  Increased urination (polyuria).  Increased urination during the night (nocturia).  Unexplained weight loss.  Frequent infections that keep coming back (recurring).  Fatigue.  Weakness.  Vision changes, such as blurry vision.  Cuts or bruises that are slow to heal.  Tingling or numbness in the hands or feet.  Dark patches on the skin (acanthosis nigricans). How is this diagnosed? This condition is diagnosed based on your symptoms, your medical history, a physical exam, and your blood glucose level. Your blood glucose may be checked with one or more of the following blood tests:  A fasting blood glucose (FBG)  test. You will not be allowed to eat (you will fast) for 8 hours or longer before a blood sample is taken.  A random blood glucose test. This test checks blood glucose at any time of day regardless of when you ate.  An A1c (hemoglobin A1c) blood test. This test provides information about blood glucose control over the previous 2-3 months.  An oral glucose tolerance test (OGTT). This test measures your blood glucose at two times: ? After fasting. This is your baseline blood glucose level. ? Two hours after drinking a beverage that contains glucose. You may be diagnosed with type 2 diabetes if:  Your FBG level is 126 mg/dL (7.0 mmol/L) or higher.  Your random blood glucose level is 200 mg/dL (11.1 mmol/L) or higher.  Your A1c level is 6.5% or higher.  Your OGTT result is higher than 200 mg/dL (11.1 mmol/L). These blood tests may be repeated to confirm your diagnosis. How is this treated? Your treatment may be managed by a specialist called an endocrinologist. Type 2 diabetes may be treated by following instructions from your health care provider about:  Making diet and lifestyle changes. This may include: ? Following an individualized nutrition plan that is developed by a diet and nutrition specialist (registered dietitian). ? Exercising regularly. ? Finding ways to manage stress.  Checking your blood glucose level as often as told.  Taking diabetes medicines or insulin daily. This helps to keep your blood glucose levels in the healthy range. ? If you use insulin, you may need to adjust the dosage depending on how physically active you are and what foods you eat. Your health care provider will tell you how to adjust your dosage.    Taking medicines to help prevent complications from diabetes, such as: ? Aspirin. ? Medicine to lower cholesterol. ? Medicine to control blood pressure. Your health care provider will set individualized treatment goals for you. Your goals will be based on  your age, other medical conditions you have, and how you respond to diabetes treatment. Generally, the goal of treatment is to maintain the following blood glucose levels:  Before meals (preprandial): 80-130 mg/dL (4.4-7.2 mmol/L).  After meals (postprandial): below 180 mg/dL (10 mmol/L).  A1c level: less than 7%. Follow these instructions at home: Questions to ask your health care provider  Consider asking the following questions: ? Do I need to meet with a diabetes educator? ? Where can I find a support group for people with diabetes? ? What equipment will I need to manage my diabetes at home? ? What diabetes medicines do I need, and when should I take them? ? How often do I need to check my blood glucose? ? What number can I call if I have questions? ? When is my next appointment? General instructions  Take over-the-counter and prescription medicines only as told by your health care provider.  Keep all follow-up visits as told by your health care provider. This is important.  For more information about diabetes, visit: ? American Diabetes Association (ADA): www.diabetes.org ? American Association of Diabetes Educators (AADE): www.diabeteseducator.org Contact a health care provider if:  Your blood glucose is at or above 240 mg/dL (13.3 mmol/L) for 2 days in a row.  You have been sick or have had a fever for 2 days or longer, and you are not getting better.  You have any of the following problems for more than 6 hours: ? You cannot eat or drink. ? You have nausea and vomiting. ? You have diarrhea. Get help right away if:  Your blood glucose is lower than 54 mg/dL (3.0 mmol/L).  You become confused or you have trouble thinking clearly.  You have difficulty breathing.  You have moderate or large ketone levels in your urine. Summary  Type 2 diabetes (type 2 diabetes mellitus) is a long-term (chronic) disease. In type 2 diabetes, the pancreas does not make enough of a  hormone called insulin, or cells in the body do not respond properly to insulin that the body makes (insulin resistance).  This condition is treated by making diet and lifestyle changes and taking diabetes medicines or insulin.  Your health care provider will set individualized treatment goals for you. Your goals will be based on your age, other medical conditions you have, and how you respond to diabetes treatment.  Keep all follow-up visits as told by your health care provider. This is important. This information is not intended to replace advice given to you by your health care provider. Make sure you discuss any questions you have with your health care provider. Document Released: 12/10/2005 Document Revised: 07/11/2017 Document Reviewed: 01/13/2016 [employer] Interactive Patient Education  2019 [employer]  

## 2019-04-16 NOTE — Progress Notes (Signed)
Subjective:  Patient ID: Ashley [name], female    DOB: [birth year]  Age: 53 y.o. MRN: [id number]  CC: Hypertension   HPI SHARRYN BELDING presents for f/up - She continues to complain of episodes of low blood pressure with dizziness, lightheadedness, and near syncope.  She had a thorough cardiovascular work-up of this about 2 months ago that was unremarkable.  She tells me she stopped taking all antihypertensives and diuretics.  Her blood pressure was 83/52 two days ago.  She tells me that during these episodes she notices that her heart rate is slightly elevated but she does not think it beats irregularly.  Outpatient Medications Prior to Visit  Medication Sig Dispense Refill   albuterol (PROVENTIL HFA;VENTOLIN HFA) 108 (90 Base) MCG/ACT inhaler Inhale 2 puffs into the lungs every 6 (six) hours as needed for wheezing or shortness of breath.     amitriptyline (ELAVIL) 50 MG tablet Take 1.5 tablets (75 mg total) by mouth at bedtime. 135 tablet 1   AQUALANCE LANCETS 30G MISC   98   aspirin EC 81 MG tablet Take 1 tablet (81 mg total) by mouth daily. 90 tablet 1   BAYER CONTOUR TEST test strip Testing for up to tid 100 each 98   BOTOX 200 units SOLR INJECT 155 UNITS IN THE MUSCLE INTO HEAD AND NECK MUSCLES EVERY 3 MONTHS BY PROVIDER 1 each 0   cholecalciferol (VITAMIN D3) 25 MCG (1000 UT) tablet Take 2,000 Units by mouth daily.     clonazePAM (KLONOPIN) 1 MG tablet Take 1 tablet (1 mg total) by mouth 2 (two) times daily as needed for anxiety. 60 tablet 3   dicyclomine (BENTYL) 10 MG capsule Take 1 capsule (10 mg total) by mouth 2 (two) times daily as needed (Cramping and abdominal pain). 180 capsule 1   docusate sodium (COLACE) 100 MG capsule Take 100 mg by mouth at bedtime.     Erenumab-aooe (AIMOVIG) 140 MG/ML SOAJ Inject 140 mg into the skin every 30 (thirty) days. 1 pen 11   etodolac (LODINE) 400 MG tablet Take 1 tablet (400 mg total) by mouth 2 (two) times daily as needed. 60 tablet 3     insulin detemir (LEVEMIR) 100 UNIT/ML injection Inject 0.3 mLs (30 Units total) into the skin daily. 10 mL 3   Insulin Pen Needle 32G X 6 MM MISC Use daily as directed 100 each 1   Lancet Devices (ADJUSTABLE LANCING DEVICE) MISC Up to tid testing  98   nitroGLYCERIN (NITROSTAT) 0.4 MG SL tablet Place 1 tablet (0.4 mg total) under the tongue every 5 (five) minutes x 3 doses as needed for chest pain. 30 tablet 5   pantoprazole (PROTONIX) 40 MG tablet Take 1 tablet (40 mg total) by mouth daily. 90 tablet 1   promethazine (PHENERGAN) 25 MG tablet Take 1 tablet (25 mg total) by mouth every 6 (six) hours as needed for nausea or vomiting. 30 tablet 1   rizatriptan (MAXALT-MLT) 10 MG disintegrating tablet Take 1 tablet (10 mg total) by mouth as needed for migraine. May repeat in 2 hours if needed. Do not take more than 3 tablets in a week. 10 tablet 3   traZODone (DESYREL) 150 MG tablet Take 1 tablet (150 mg total) by mouth at bedtime. 90 tablet 1   venlafaxine (EFFEXOR) 37.5 MG tablet TAKE 1 TABLET BY MOUTH TWICE DAILY (Patient taking differently: Take 37.5 mg by mouth 2 (two) times daily. ) 180 tablet 1   rosuvastatin (CRESTOR)  20 MG tablet TAKE 1 TABLET BY MOUTH DAILY (Patient taking differently: Take 20 mg by mouth daily. ) 90 tablet 0   olmesartan (BENICAR) 20 MG tablet Take 20 mg by mouth daily.     No facility-administered medications prior to visit.     ROS Review of Systems  Constitutional: Negative.  Negative for diaphoresis and fatigue.  HENT: Negative.  Negative for trouble swallowing.   Eyes: Negative for visual disturbance.  Respiratory: Negative for cough, chest tightness, shortness of breath and wheezing.   Cardiovascular: Negative for chest pain, palpitations and leg swelling.  Gastrointestinal: Negative for abdominal pain, diarrhea, nausea and vomiting.  Endocrine: Negative for polydipsia, polyphagia and polyuria.  Genitourinary: Negative.  Negative for difficulty  urinating and frequency.  Musculoskeletal: Negative.  Negative for arthralgias and myalgias.  Skin: Negative.   Neurological: Positive for dizziness and light-headedness. Negative for seizures, syncope and weakness.  Hematological: Negative for adenopathy. Does not bruise/bleed easily.  Psychiatric/Behavioral: Negative.     Objective:  BP 116/82 (BP Location: Left Arm, Patient Position: Sitting, Cuff Size: Normal)    Pulse 79    Temp 97.8 F (36.6 C) (Oral)    Resp 16    Ht 5\' 2"  (1.575 m)    Wt 172 lb 8 oz (78.2 kg)    LMP 02/12/2014    SpO2 96%    BMI 31.55 kg/m   BP Readings from Last 3 Encounters:  04/16/19 116/82  03/09/19 122/62  03/04/19 (!) 104/52    Wt Readings from Last 3 Encounters:  04/16/19 172 lb 8 oz (78.2 kg)  03/09/19 173 lb (78.5 kg)  03/04/19 174 lb (78.9 kg)    Physical Exam Vitals signs reviewed.  Constitutional:      Appearance: She is obese. She is not ill-appearing or diaphoretic.  HENT:     Nose: Nose normal.     Mouth/Throat:     Mouth: Mucous membranes are moist.     Pharynx: No posterior oropharyngeal erythema.  Eyes:     General: No scleral icterus.    Conjunctiva/sclera: Conjunctivae normal.  Neck:     Musculoskeletal: Normal range of motion and neck supple. No neck rigidity or muscular tenderness.  Cardiovascular:     Rate and Rhythm: Normal rate and regular rhythm.     Heart sounds: No murmur.  Pulmonary:     Effort: Pulmonary effort is normal. No respiratory distress.     Breath sounds: No stridor. No wheezing, rhonchi or rales.  Abdominal:     General: Bowel sounds are normal.     Palpations: There is no mass.     Tenderness: There is no abdominal tenderness. There is no guarding.  Musculoskeletal: Normal range of motion.        General: No swelling.     Right lower leg: No edema.     Left lower leg: No edema.  Skin:    General: Skin is warm and dry.  Neurological:     General: No focal deficit present.  Psychiatric:         Mood and Affect: Mood normal.        Behavior: Behavior normal.     Lab Results  Component Value Date   WBC 6.1 02/18/2019   HGB 12.4 02/18/2019   HCT 37.7 02/18/2019   PLT 322 02/18/2019   GLUCOSE 211 (H) 04/16/2019   CHOL 114 10/20/2018   TRIG 103.0 10/20/2018   HDL 39.70 10/20/2018   LDLDIRECT 152.1 01/19/2013  LDLCALC 54 10/20/2018   ALT 24 05/14/2018   AST 21 05/14/2018   NA 138 04/16/2019   K 4.1 04/16/2019   CL 102 04/16/2019   CREATININE 0.89 04/16/2019   BUN 14 04/16/2019   CO2 25 04/16/2019   TSH 1.40 12/30/2017   INR 0.91 05/14/2018   HGBA1C 7.8 (H) 04/16/2019   MICROALBUR <0.7 02/10/2019    Ct Chest W Contrast  Result Date: 03/13/2019 CLINICAL DATA:  Pt with precordial pain and abn cxr w/lung nodules Pt has increased SOB, was hospitalized recently EXAM: CT CHEST WITH CONTRAST TECHNIQUE: Multidetector CT imaging of the chest was performed during intravenous contrast administration. CONTRAST:  44mL OMNIPAQUE IOHEXOL 300 MG/ML  SOLN COMPARISON:  Chest x-ray on 02/18/2019 FINDINGS: Cardiovascular: No significant vascular findings. Normal heart size. No pericardial effusion. Incidental note of bovine arch anatomy. Mediastinum/Nodes: Mildly heterogeneous thyroid without discrete mass. Esophagus is normal in appearance. No mediastinal, hilar, or axillary adenopathy. Lungs/Pleura: There is streaky atelectasis or scarring within the RIGHT middle lobe, lingula, and both LOWER lobes. A perifissural nodule is identified adjacent to the RIGHT oblique fissure, best seen on image 56/3 and measures 5 millimeters. There are no pleural effusions or focal consolidations. Airways are unremarkable. Upper Abdomen: No acute abnormality. Musculoskeletal: No chest wall abnormality. No acute or significant osseous findings. IMPRESSION: 1. 5 millimeter perifissural nodule adjacent to the RIGHT oblique fissure. No follow-up needed if patient is low-risk. Non-contrast chest CT can be considered in  12 months if patient is high-risk. This recommendation follows the consensus statement: Guidelines for Management of Incidental Pulmonary Nodules Detected on CT Images: From the Fleischner Society 2017; Radiology 2017; 284:228-243. 2. Streaky atelectasis within the RIGHT middle lobe, lingula, and both LOWER lobes. 3. No pulmonary edema or significant cardiac finding. Electronically Signed   By: Nolon Nations M.D.   On: 03/13/2019 15:41   ECHO 2020 IMPRESSIONS    1. The left ventricle has normal systolic function with an ejection fraction of 60-65%. The cavity size was normal. Left ventricular diastolic parameters were normal.  2. The right ventricle has normal systolic function. The cavity was normal. There is no increase in right ventricular wall thickness.  3. The mitral valve is normal in structure.  4. The tricuspid valve is normal in structure.  5. The aortic valve is tricuspid.  6. The pulmonic valve was normal in structure.   Assessment & Plan:   Saryn was seen today for hypertension.  Diagnoses and all orders for this visit:  Essential hypertension- Her blood pressure is adequately well controlled.  Due to the symptomatic hypotension she will stop taking the ARB. -     Basic metabolic panel; Future  Hypotension, unspecified hypotension type- She has had a thorough cardiovascular work-up.  Her cortisol level is normal which indicates she does not have adrenal insufficiency.  Her review of symptoms and exam are reassuring.  I am concerned she has idiopathic orthostatic hypotension so I have asked her to start fludrocortisone. -     Cortisol; Future -     fludrocortisone (FLORINEF) 0.1 MG tablet; Take 1 tablet (0.1 mg total) by mouth daily.  Diabetes mellitus with complication ([place])- Her L4Y is at 7.8%.  Her blood sugars are not adequately well controlled.  I have asked her to add metformin to the basal insulin. -     Hemoglobin A1c; Future   I have discontinued Ashlin B.  Schlink's olmesartan. I am also having her start on fludrocortisone and metFORMIN. Additionally,  I am having her maintain her docusate sodium, albuterol, Adjustable Lancing Device, AquaLance Lancets 30G, Bayer Contour Test, Insulin Pen Needle, Erenumab-aooe, traZODone, aspirin EC, rizatriptan, amitriptyline, Botox, venlafaxine, pantoprazole, promethazine, cholecalciferol, nitroGLYCERIN, clonazePAM, etodolac, insulin detemir, and dicyclomine.  Meds ordered this encounter  Medications   fludrocortisone (FLORINEF) 0.1 MG tablet    Sig: Take 1 tablet (0.1 mg total) by mouth daily.    Dispense:  90 tablet    Refill:  0   metFORMIN (GLUCOPHAGE-XR) 750 MG 24 hr tablet    Sig: Take 2 tablets (1,500 mg total) by mouth daily with breakfast.    Dispense:  180 tablet    Refill:  1     Follow-up: Return in about 4 months (around 08/16/2019).  Scarlette Calico, MD

## 2019-04-17 ENCOUNTER — Telehealth: Payer: Self-pay | Admitting: Internal Medicine

## 2019-04-17 NOTE — Telephone Encounter (Signed)
Copied from [place] [id number]. Topic: General - Other >> Apr 17, 2019  1:38 PM Keene Breath wrote: Reason for CRM: Patient called to request a call from the nurse regarding her BP.  Patient stated that her BP has been dropping.  Please advise and call patient back at [phone]

## 2019-04-17 NOTE — Telephone Encounter (Signed)
Patient stated her bp reading about 20 min ago was 89/50---patient states she has not taken any blood pressure medicine--no n/v/d----patient's husband is on way home now with new bp machine just to make sure this is an accurate reading---patient states she has had episode like this in past, was hospitalized---patient states she will go to [facility] ED if bp reading is still low---

## 2019-04-20 ENCOUNTER — Other Ambulatory Visit: Payer: Self-pay | Admitting: Internal Medicine

## 2019-04-20 DIAGNOSIS — E118 Type 2 diabetes mellitus with unspecified complications: Secondary | ICD-10-CM

## 2019-04-20 DIAGNOSIS — E785 Hyperlipidemia, unspecified: Secondary | ICD-10-CM

## 2019-04-20 DIAGNOSIS — I739 Peripheral vascular disease, unspecified: Secondary | ICD-10-CM

## 2019-04-21 ENCOUNTER — Encounter: Payer: Self-pay | Admitting: Internal Medicine

## 2019-04-21 MED ORDER — METFORMIN HCL ER 750 MG PO TB24: 1500.0000 mg | ORAL_TABLET | Freq: Every day | ORAL | 1 refills | Status: DC

## 2019-04-21 MED ORDER — FLUDROCORTISONE ACETATE 0.1 MG PO TABS: 0.1000 mg | ORAL_TABLET | Freq: Every day | ORAL | 0 refills | Status: DC

## 2019-04-27 ENCOUNTER — Emergency Department (HOSPITAL_COMMUNITY): Payer: PRIVATE HEALTH INSURANCE

## 2019-04-27 ENCOUNTER — Ambulatory Visit: Payer: Self-pay

## 2019-04-27 ENCOUNTER — Other Ambulatory Visit: Payer: Self-pay

## 2019-04-27 ENCOUNTER — Encounter (HOSPITAL_COMMUNITY): Payer: Self-pay | Admitting: Emergency Medicine

## 2019-04-27 ENCOUNTER — Emergency Department (HOSPITAL_COMMUNITY)
Admission: EM | Admit: 2019-04-27 | Discharge: 2019-04-27 | Disposition: A | Discharge status: Home / Self Care | Payer: PRIVATE HEALTH INSURANCE | Attending: Emergency Medicine | Admitting: Emergency Medicine

## 2019-04-27 DIAGNOSIS — Z79899 Other long term (current) drug therapy: Secondary | ICD-10-CM | POA: Insufficient documentation

## 2019-04-27 DIAGNOSIS — R079 Chest pain, unspecified: Secondary | ICD-10-CM

## 2019-04-27 DIAGNOSIS — E119 Type 2 diabetes mellitus without complications: Secondary | ICD-10-CM | POA: Insufficient documentation

## 2019-04-27 DIAGNOSIS — I10 Essential (primary) hypertension: Secondary | ICD-10-CM | POA: Diagnosis not present

## 2019-04-27 DIAGNOSIS — R112 Nausea with vomiting, unspecified: Secondary | ICD-10-CM | POA: Insufficient documentation

## 2019-04-27 DIAGNOSIS — Z794 Long term (current) use of insulin: Secondary | ICD-10-CM | POA: Diagnosis not present

## 2019-04-27 DIAGNOSIS — Z20828 Contact with and (suspected) exposure to other viral communicable diseases: Secondary | ICD-10-CM | POA: Insufficient documentation

## 2019-04-27 DIAGNOSIS — Z7982 Long term (current) use of aspirin: Secondary | ICD-10-CM | POA: Insufficient documentation

## 2019-04-27 DIAGNOSIS — F17210 Nicotine dependence, cigarettes, uncomplicated: Secondary | ICD-10-CM | POA: Diagnosis not present

## 2019-04-27 DIAGNOSIS — R0789 Other chest pain: Secondary | ICD-10-CM | POA: Insufficient documentation

## 2019-04-27 LAB — COMPREHENSIVE METABOLIC PANEL
ALT: 16 U/L (ref 0–44)
AST: 16 U/L (ref 15–41)
Albumin: 3.3 g/dL — ABNORMAL LOW (ref 3.5–5.0)
Alkaline Phosphatase: 84 U/L (ref 38–126)
Anion gap: 11 (ref 5–15)
BUN: 11 mg/dL (ref 6–20)
CO2: 23 mmol/L (ref 22–32)
Calcium: 8.7 mg/dL — ABNORMAL LOW (ref 8.9–10.3)
Chloride: 104 mmol/L (ref 98–111)
Creatinine, Ser: 1.02 mg/dL — ABNORMAL HIGH (ref 0.44–1.00)
GFR calc Af Amer: >60 mL/min (ref >60)
GFR calc non Af Amer: >60 mL/min (ref >60)
Glucose, Bld: 243 mg/dL — ABNORMAL HIGH (ref 70–99)
Potassium: 4.1 mmol/L (ref 3.5–5.1)
Sodium: 138 mmol/L (ref 135–145)
Total Bilirubin: 0.4 mg/dL (ref 0.3–1.2)
Total Protein: 6.3 g/dL — ABNORMAL LOW (ref 6.5–8.1)

## 2019-04-27 LAB — CBC
HCT: 38.7 % (ref 36.0–46.0)
Hemoglobin: 12.6 g/dL (ref 12.0–15.0)
MCH: 30.9 pg (ref 26.0–34.0)
MCHC: 32.6 g/dL (ref 30.0–36.0)
MCV: 94.9 fL (ref 80.0–100.0)
Platelets: 309 10*3/uL (ref 150–400)
RBC: 4.08 MIL/uL (ref 3.87–5.11)
RDW: 14.6 % (ref 11.5–15.5)
WBC: 9.1 10*3/uL (ref 4.0–10.5)
nRBC: 0 % (ref 0.0–0.2)

## 2019-04-27 LAB — TROPONIN I
Troponin I: <0.03 ng/mL (ref <0.03)
Troponin I: <0.03 ng/mL (ref <0.03)

## 2019-04-27 LAB — LIPASE, BLOOD: Lipase: 27 U/L (ref 11–51)

## 2019-04-27 LAB — SARS CORONAVIRUS 2 BY RT PCR (HOSPITAL ORDER, PERFORMED IN ~~LOC~~ HOSPITAL LAB): SARS Coronavirus 2: NEGATIVE

## 2019-04-27 MED ORDER — SODIUM CHLORIDE 0.9 % IV BOLUS (SEPSIS)
1000.0000 mL | Freq: Once | INTRAVENOUS | Status: AC
  Administered 2019-04-27: 1000 mL via INTRAVENOUS

## 2019-04-27 MED ORDER — MORPHINE SULFATE (PF) 2 MG/ML IV SOLN
1.0000 mg | Freq: Once | INTRAVENOUS | Status: AC
  Administered 2019-04-27: 1 mg via INTRAVENOUS
  Filled 2019-04-27: qty 1

## 2019-04-27 MED ORDER — ALUM & MAG HYDROXIDE-SIMETH 200-200-20 MG/5ML PO SUSP
30.0000 mL | Freq: Once | ORAL | Status: AC
  Administered 2019-04-27: 30 mL via ORAL
  Filled 2019-04-27: qty 30

## 2019-04-27 MED ORDER — SODIUM CHLORIDE 0.9 % IV BOLUS
500.0000 mL | Freq: Once | INTRAVENOUS | Status: AC
  Administered 2019-04-27: 500 mL via INTRAVENOUS

## 2019-04-27 MED ORDER — LIDOCAINE VISCOUS HCL 2 % MT SOLN
15.0000 mL | Freq: Once | OROMUCOSAL | Status: AC
  Administered 2019-04-27: 15 mL via ORAL
  Filled 2019-04-27: qty 15

## 2019-04-27 MED ORDER — MORPHINE SULFATE (PF) 2 MG/ML IV SOLN
2.0000 mg | Freq: Once | INTRAVENOUS | Status: DC
  Filled 2019-04-27: qty 1

## 2019-04-27 NOTE — Discharge Instructions (Addendum)
Follow up with your doctor, return to ER for new or worsening symptoms.

## 2019-04-27 NOTE — ED Provider Notes (Signed)
  Physical Exam  BP (!) 91/49   Pulse 73   Temp 98.1 F (36.7 C) (Oral)   Resp 19   LMP 02/12/2014   SpO2 92%   Physical Exam  ED Course/Procedures   Clinical Course as of Apr 26 2106  Mon Apr 27, 2019  1824 Chest pain 230 pm. Nitro and aspirin w/o relief. Heart score 6   [KM]  2052 I spoke with cardiology consult who feels that the patient can probably go home after second troponin.  Cardiology reports that the patient has had extensive negative cardiac work-up in the past including a completely negative cath 3 years ago.  They do not believe that her pain is cardiac in nature.  I will obtain a second troponin and continue to give her fluids to increase her blood pressure.   [KM]  2106 Patient is currently feeling well sitting on the stretcher.  Continues to get fluids and waiting on second troponin.  If second opponent is negative and her blood pressure normalizes then she will go home.   [KM]    Clinical Course User Index [KM] Kristine Royal    Procedures  MDM    53 year old female past medical history of diabetes, smoking, hypercholesterolemia presents with substernal chest pain that began 2 and half hours prior to coming to the ED.  Reports she had an aspirin and a nitro at home and this did not help.  Pain relieved with some morphine.  Blood pressures dropped to 90s over 50s after morphine.  Patient reports 6 out of 10 chest pain after morphine.  Patient's heart score is 6 and I will consult with hospitalist for admission    Kristine Royal 04/27/19 2107    Pattricia Boss, MD 04/28/19 [phone]

## 2019-04-27 NOTE — Telephone Encounter (Signed)
Incoming  Call from Patient who complains of Chest pain.  States that pain is located in center, and under left breast.  Onset began around 3 : 00 pm.  Took three nitroglycerin pills with no relief. Pattern is constant. Is rated Severe. Has a  History of  Elevated  blood pressure and COPD.  Feels the need to take a  Deep breath .  Reports dizziness and vomiting.  Reviewed protocol with Patient.  Reports blood pressure was 108/68 heart rate was 90 blood sugar was 199. Hx of   Hysterectomy .  Reviewed Protocol and provided Care advice .  Recommended Patient go to ED for further evaluation.  Patient voiced understanding.Patient states that she will go to   [facility]  ED.  Will call 911.  Reason for Disposition . [1] Chest pain lasts > 5 minutes AND [2] history of heart disease  (i.e., heart attack, bypass surgery, angina, angioplasty, CHF; not just a heart murmur)  Answer Assessment - Initial Assessment Questions 1. LOCATION: "Where does it hurt?"      Center and undeerleft breast  2. RADIATION: "Does the pain shoot anywhere else?" (e.g., chest, back)     *No Answer* 3. ONSET: "When did the pain begin?" (e.g., minutes, hours or days ago)      *No Answer* 3;00 pm 4. SUDDEN: "Gradual or sudden onset?"     sudden 5. PATTERN "Does the pain come and go, or is it constant?"    - If constant: "Is it getting better, staying the same, or worsening?"      (Note: Constant means the pain never goes away completely; most serious pain is constant and it progresses)     - If intermittent: "How long does it last?" "Do you have pain now?"     (Note: Intermittent means the pain goes away completely between bouts)     constant 6. SEVERITY: "How bad is the pain?"  (e.g., Scale 1-10; mild, moderate, or severe)   - MILD (1-3): doesn't interfere with normal activities, abdomen soft and not tender to touch    - MODERATE (4-7): interferes with normal activities or awakens from sleep, tender to touch    - SEVERE (8-10):  excruciating pain, doubled over, unable to do any normal activities      severe7. RECURRENT SYMPTOM: "Have you ever had this type of abdominal pain before?" If so, ask: "When was the last time?" and "What happened that time?"       8. CAUSE: "What do you think is causing the abdominal pain?"     *No Answer* 9. RELIEVING/AGGRAVATING FACTORS: "What makes it better or worse?" (e.g., movement, antacids, bowel movement)     *No Answer* 10. OTHER SYMPTOMS: "Has there been any vomiting, diarrhea, constipation, or urine problems?"       *No Answer* 11. PREGNANCY: "Is there any chance you are pregnant?" "When was your last menstrual period?"       *No Answer*  Answer Assessment - Initial Assessment Questions 1. LOCATION: "Where does it hurt?"       middle 2. RADIATION: "Does the pain go anywhere else?" (e.g., into neck, jaw, arms, back)      " Up my neck  Into back" 3. ONSET: "When did the chest pain begin?" (Minutes, hours or days)     3:00pm 4. PATTERN "Does the pain come and go, or has it been constant since it started?"  "Does it get worse with exertion?"      Constant  5. DURATION: "How long does it last" (e.g., seconds, minutes, hours)     *No Answer* 6. SEVERITY: "How bad is the pain?"  (e.g., Scale 1-10; mild, moderate, or severe)    - MILD (1-3): doesn't interfere with normal activities     - MODERATE (4-7): interferes with normal activities or awakens from sleep    - SEVERE (8-10): excruciating pain, unable to do any normal activities       severe 7. CARDIAC RISK FACTORS: "Do you have any history of heart problems or risk factors for heart disease?" (e.g., prior heart attack, angina; high blood pressure, diabetes, being overweight, high cholesterol, smoking, or strong family history of heart disease)     Blood Pressure 8. PULMONARY RISK FACTORS: "Do you have any history of lung disease?"  (e.g., blood clots in lung, asthma, emphysema, birth control pills)     COPD 9. CAUSE: "What do  you think is causing the chest pain?"      10. OTHER SYMPTOMS: "Do you have any other symptoms?" (e.g., dizziness, nausea, vomiting, sweating, fever, difficulty breathing, cough)       Vomit,   Need take a deep breath,  Dizziness, vomiting 11. PREGNANCY: "Is there any chance you are pregnant?" "When was your last menstrual period?"       hysterctomy  Protocols used: CHEST PAIN-A-AH, ABDOMINAL PAIN - [facility]

## 2019-04-27 NOTE — ED Triage Notes (Signed)
Pt brought in by [facility]. Pt reports sudden onset CP around 2:30 pm today. Pt reports it is to the left side of her chest radiating to back, neck and L shoulder. Pt reports 1 episode of vomiting today, SHOB, dizziness, and lightheadedness. Pt denies cough, fever or sick contact. Pt took 3 nitro PTA. EMS gave pt 324 of ASA. Pt has hx of HTN, and DM.

## 2019-04-27 NOTE — ED Provider Notes (Signed)
53yo female with chest pain. Consult to cardiology after normal trop x 1, recommends repeat trop and if negative pt can be dc.  Physical Exam  BP (!) 98/56   Pulse 75   Temp 98.1 F (36.7 C) (Oral)   Resp 14   LMP 02/12/2014   SpO2 94%   Physical Exam  ED Course/Procedures   Clinical Course as of Apr 26 2301  Mon Apr 27, 2019  1824 Chest pain 230 pm. Nitro and aspirin w/o relief. Heart score 6   [KM]  2052 I spoke with cardiology consult who feels that the patient can probably go home after second troponin.  Cardiology reports that the patient has had extensive negative cardiac work-up in the past including a completely negative cath 3 years ago.  They do not believe that her pain is cardiac in nature.  I will obtain a second troponin and continue to give her fluids to increase her blood pressure.   [KM]  2106 Patient is currently feeling well sitting on the stretcher.  Continues to get fluids and waiting on second troponin.  If second opponent is negative and her blood pressure normalizes then she will go home.   [KM]    Clinical Course User Index [KM] Alveria Apley, PA-C    Procedures  MDM  Repeat troponin negative, patient may be dc to follow up with PCP, return to ER as needed. Discussed results and plan of care, patient plans to contact her PCP tomorrow for follow up.      Tacy Learn, PA-C 04/27/19 2302    Carmin Muskrat, MD 04/28/19 [phone]

## 2019-04-27 NOTE — ED Notes (Signed)
Patient verbalizes understanding of discharge instructions. Opportunity for questioning and answers were provided. Armband removed by staff, pt discharged from ED.  

## 2019-04-27 NOTE — ED Provider Notes (Addendum)
[facility] EMERGENCY DEPARTMENT Provider Note   CSN: [id number] Arrival date & time: 04/27/19  1631    History   Chief Complaint Chief Complaint  Patient presents with  . Chest Pain    HPI Ashley [name] is a 53 y.o. female.     HPI  53 year old female with history of diabetes, smoker, high cholesterol who presents today complaining of substernal chest pain that began approximately 2 and half hours prior to my evaluation.  She states that it is pressure at 8 out of 10 in nature.  It radiates through to her left shoulder.  She had associated episode of nausea and vomiting.  She denies any shortness of breath, fever, chills, or cough.  She took 3 sublingual nitro at home without any change in the pain.  She was given aspirin in route by EMS.  Heart path A 53 year old patient with a history of treated diabetes, hypercholesterolemia and obesity presents for evaluation of chest pain. Initial onset of pain was approximately 1-3 hours ago. The patient's chest pain is described as heaviness/pressure/tightness and is not worse with exertion. The patient complains of nausea. The patient's chest pain is middle- or left-sided, is not well-localized, is not sharp and does radiate to the arms/jaw/neck. The patient denies diaphoresis. The patient has smoked in the past 90 days. The patient has no history of stroke, has no history of peripheral artery disease, has no relevant family history of coronary artery disease (first degree relative at less than age 37) and is not hypertensive.  Past Medical History:  Diagnosis Date  . Anxiety   . Bronchitis    uses inhaler if needed for bronchitis, lasted used -2014  . Cataract    bilateral  . Chronic headaches    migraines - in past, uses Phenergan for nausea   . Deficiency anemia 12/30/2017  . Diabetes mellitus without complication ([place])    taken off Metformin since 04/2014, HgbA1C - normal, will follow up with PCP- Dr. Deborra Medina, 07/2014  . GERD  (gastroesophageal reflux disease)   . H/O exercise stress test 2011   done at [facility]- told that it was WNL, done due to pt. having panic attacks   . History of blood transfusion 1966-06-01   at birth in [place], [place], unsure number of units  . Poor dentition    very poor oral health   . SVD (spontaneous vaginal delivery)    x 3  . Tobacco abuse     Patient Active Problem List   Diagnosis Date Noted  . Hypotension 03/04/2019  . Abnormal chest x-Kingstin Heims with multiple lung nodules 03/04/2019  . Chest pain 02/17/2019  . Lumbar radiculitis 02/10/2019  . Gastroesophageal reflux disease with esophagitis 02/10/2019  . Essential hypertension 10/20/2018  . Chronic migraine without aura, with intractable migraine, so stated, with status migrainosus 07/02/2018  . Thiamine deficiency 01/05/2018  . Claudication of both lower extremities ([place]) 12/30/2017  . Constipation 09/12/2017  . Moderate episode of recurrent major depressive disorder ([place]) 05/16/2017  . Diabetes mellitus with complication ([place])   . 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 Tobacco abuse 01/08/2017  . Chronic bronchitis ([place]) 10/20/2015  . Dyslipidemia, goal LDL below 70 04/12/2015  . Migraine with aura, intractable 08/12/2014  . Routine general medical examination at a health care facility 05/24/2011  . Anxiety state 09/30/2009  . INSOMNIA, CHRONIC 09/30/2009    Past Surgical History:  Procedure Laterality Date  . ABDOMINAL HYSTERECTOMY    . APPENDECTOMY  1988  .  CARDIAC CATHETERIZATION N/A 08/17/2016   Procedure: Left Heart Cath and Coronary Angiography;  Surgeon: Burnell Blanks, MD;  Location: [facility];  Service: Cardiovascular;  Laterality: N/A;  . CHOLECYSTECTOMY N/A 06/07/2014   Procedure: LAPAROSCOPIC CHOLECYSTECTOMY;  Surgeon: Ralene Ok, MD;  Location: [facility];  Service: General;  Laterality: N/A;  . CHOLECYSTECTOMY  June 07 2014  . COLONOSCOPY  02/16/2014   normal   . ESOPHAGOGASTRODUODENOSCOPY   12/28/2013  . KNEE ARTHROSCOPY  1995   left  . LAPAROSCOPIC ASSISTED VAGINAL HYSTERECTOMY N/A 04/15/2014   Procedure: LAPAROSCOPIC ASSISTED VAGINAL HYSTERECTOMY;  Surgeon: Marylynn Pearson, MD;  Location: [facility];  Service: Gynecology;  Laterality: N/A;  . LAPAROSCOPIC BILATERAL SALPINGO OOPHERECTOMY Bilateral 04/15/2014   Procedure: LAPAROSCOPIC BILATERAL SALPINGO OOPHORECTOMY;  Surgeon: Marylynn Pearson, MD;  Location: [facility];  Service: Gynecology;  Laterality: Bilateral;  . TUBAL LIGATION       OB History   No obstetric history on file.      Home Medications    Prior to Admission medications   Medication Sig Start Date End Date Taking? Authorizing Provider  albuterol (PROVENTIL HFA;VENTOLIN HFA) 108 (90 Base) MCG/ACT inhaler Inhale 2 puffs into the lungs every 6 (six) hours as needed for wheezing or shortness of breath.    [provider]  amitriptyline (ELAVIL) 50 MG tablet Take 1.5 tablets (75 mg total) by mouth at bedtime. 11/12/18   Janith Lima, MD  AQUALANCE LANCETS 30G MISC  11/27/17   [provider]  aspirin EC 81 MG tablet Take 1 tablet (81 mg total) by mouth daily. 10/20/18   Janith Lima, MD  BAYER CONTOUR TEST test strip Testing for up to tid 03/12/18   Janith Lima, MD  BOTOX 200 units SOLR INJECT 155 UNITS IN THE MUSCLE INTO HEAD AND NECK MUSCLES EVERY 3 MONTHS BY PROVIDER 01/01/19   Melvenia Beam, MD  cholecalciferol (VITAMIN D3) 25 MCG (1000 UT) tablet Take 2,000 Units by mouth daily.    [provider]  clonazePAM (KLONOPIN) 1 MG tablet Take 1 tablet (1 mg total) by mouth 2 (two) times daily as needed for anxiety. 02/24/19   Janith Lima, MD  dicyclomine (BENTYL) 10 MG capsule Take 1 capsule (10 mg total) by mouth 2 (two) times daily as needed (Cramping and abdominal pain). 04/07/19   Janith Lima, MD  docusate sodium (COLACE) 100 MG capsule Take 100 mg by mouth at bedtime.    [provider]  Erenumab-aooe (AIMOVIG) 140  MG/ML SOAJ Inject 140 mg into the skin every 30 (thirty) days. 07/02/18   Melvenia Beam, MD  etodolac (LODINE) 400 MG tablet Take 1 tablet (400 mg total) by mouth 2 (two) times daily as needed. 03/18/19   Hilts, Legrand Como, MD  fludrocortisone (FLORINEF) 0.1 MG tablet Take 1 tablet (0.1 mg total) by mouth daily. 04/21/19   Janith Lima, MD  insulin detemir (LEVEMIR) 100 UNIT/ML injection Inject 0.3 mLs (30 Units total) into the skin daily. 03/30/19   Janith Lima, MD  Insulin Pen Needle 32G X 6 MM MISC Use daily as directed 05/12/18   Marrian Salvage, FNP  Lancet Devices (ADJUSTABLE LANCING DEVICE) MISC Up to tid testing 09/19/17   [provider]  metFORMIN (GLUCOPHAGE-XR) 750 MG 24 hr tablet Take 2 tablets (1,500 mg total) by mouth daily with breakfast. 04/21/19   Janith Lima, MD  nitroGLYCERIN (NITROSTAT) 0.4 MG SL tablet Place 1 tablet (0.4 mg  total) under the tongue every 5 (five) minutes x 3 doses as needed for chest pain. 02/18/19   Elodia Florence., MD  pantoprazole (PROTONIX) 40 MG tablet Take 1 tablet (40 mg total) by mouth daily. 02/10/19   Janith Lima, MD  promethazine (PHENERGAN) 25 MG tablet Take 1 tablet (25 mg total) by mouth every 6 (six) hours as needed for nausea or vomiting. 02/11/19   Janith Lima, MD  rizatriptan (MAXALT-MLT) 10 MG disintegrating tablet Take 1 tablet (10 mg total) by mouth as needed for migraine. May repeat in 2 hours if needed. Do not take more than 3 tablets in a week. 10/23/18   Janith Lima, MD  rosuvastatin (CRESTOR) 20 MG tablet Take 1 tablet (20 mg total) by mouth daily. 04/20/19   Janith Lima, MD  traZODone (DESYREL) 150 MG tablet Take 1 tablet (150 mg total) by mouth at bedtime. 10/20/18   Janith Lima, MD  venlafaxine (EFFEXOR) 37.5 MG tablet TAKE 1 TABLET BY MOUTH TWICE DAILY Patient taking differently: Take 37.5 mg by mouth 2 (two) times daily.  01/26/19   Janith Lima, MD    Family History Family History   Problem Relation Age of Onset  . Diabetes Mother   . Hyperlipidemia Mother   . Hypertension Mother   . Anxiety disorder Mother   . Depression Mother   . Drug abuse Mother   . Prostate cancer Father   . Alcohol abuse Father   . Lung cancer Maternal Grandfather        smoked  . Emphysema Maternal Grandfather        smoked  . COPD Maternal Grandmother        never smoked   . Dementia Maternal Grandmother   . Thyroid cancer Paternal Aunt   . Colon cancer Neg Hx   . Rectal cancer Neg Hx   . Stomach cancer Neg Hx   . Migraines Neg Hx     Social History Social History   Tobacco Use  . Smoking status: Current Every Day Smoker    Packs/day: 2.00    Years: 35.00    Pack years: 70.00    Types: Cigarettes  . Smokeless tobacco: Never Used  . Tobacco comment: 08-17-17- pt reports smokes "almost 2 packs a day"  Substance Use Topics  . Alcohol use: No    Alcohol/week: 0.0 standard drinks  . Drug use: No     Allergies   Penicillins and Topamax [topiramate]   Review of Systems Review of Systems   Physical Exam Updated Vital Signs BP (!) 93/54 (BP Location: Left Arm)   Pulse 75   Temp 98.1 F (36.7 C) (Oral)   Resp 18   LMP 02/12/2014   SpO2 96%   Physical Exam Vitals signs and nursing note reviewed.  Constitutional:      Appearance: She is well-developed. She is obese.  HENT:     Head: Normocephalic and atraumatic.  Eyes:     Pupils: Pupils are equal, round, and reactive to light.  Neck:     Musculoskeletal: Normal range of motion.  Cardiovascular:     Rate and Rhythm: Normal rate and regular rhythm.     Heart sounds: Normal heart sounds.  Pulmonary:     Effort: Pulmonary effort is normal.     Breath sounds: Normal breath sounds.  Abdominal:     General: Bowel sounds are normal.     Palpations: Abdomen is soft.  Musculoskeletal: Normal  range of motion.  Skin:    General: Skin is warm and dry.     Capillary Refill: Capillary refill takes less than 2  seconds.  Neurological:     General: No focal deficit present.     Mental Status: She is alert.  Psychiatric:        Mood and Affect: Mood normal.      ED Treatments / Results  Labs (all labs ordered are listed, but only abnormal results are displayed) Labs Reviewed - No data to display  EKG EKG Interpretation  Date/Time:  Monday Apr 27 2019 16:37:24 EDT Ventricular Rate:  74 PR Interval:    QRS Duration: 84 QT Interval:  396 QTC Calculation: 440 R Axis:   37 Text Interpretation:  Sinus rhythm nsst changes Confirmed by Pattricia Boss [phone]) on 04/27/2019 5:20:10 PM   Radiology No results found.  Procedures Procedures (including critical care time)  Medications Ordered in ED Medications - No data to display   Initial Impression / Assessment and Plan / ED Course  I have reviewed the triage vital signs and the nursing notes.  Pertinent labs & imaging results that were available during my care of the patient were reviewed by me and considered in my medical decision making (see chart for details).  Clinical Course as of Apr 28 1218  Mon Apr 27, 2019  1824 Chest pain 230 pm. Nitro and aspirin w/o relief. Heart score 6   [KM]  2052 I spoke with cardiology consult who feels that the patient can probably go home after second troponin.  Cardiology reports that the patient has had extensive negative cardiac work-up in the past including a completely negative cath 3 years ago.  They do not believe that her pain is cardiac in nature.  I will obtain a second troponin and continue to give her fluids to increase her blood pressure.   [KM]  2106 Patient is currently feeling well sitting on the stretcher.  Continues to get fluids and waiting on second troponin.  If second opponent is negative and her blood pressure normalizes then she will go home.   [KM]    Clinical Course User Index [KM] Alveria Apley, PA-C   53 year old female presents today with chest pain.  Pain is ongoing  despite nitroglycerin and aspirin.  Patient has had recent admission with normal echocardiogram.  Patient's heart score is 6.  Labs are still pending.  When these have been completed she will be admitted for further evaluation Discussed with Madilyn Hook, PA-C and she will review labs and facilitate admission 1- chest pain- began 230 p no acute changes ekg Pain continued with nitro, aspirin and ms and gi cocktail.  Repeat ms being given. 2-sbp low  Final Clinical Impressions(s) / ED Diagnoses   Final diagnoses:  Chest pain, unspecified type    ED Discharge Orders    None       Pattricia Boss, MD 04/27/19 1842    Pattricia Boss, MD 04/27/19 Mayra Reel, MD 04/29/19 [phone]

## 2019-04-28 NOTE — Consult Note (Signed)
Cardiology Consultation:   Patient ID: Ashley [name] MRN: [id number]; DOB: Sep 18, 1966  Admit date: 04/27/2019 Date of Consult: 04/28/2019  Primary Care Provider: Janith Lima, MD Primary Cardiologist: Virl Axe, MD  Primary Electrophysiologist:  None    Patient Profile:   Ashley [name] is a 53 y.o. female with a hx of hypertension, tobacco use and diabetes who is being seen today for the evaluation of chest pain.  History of Present Illness:   Ashley [name] presents to the ER today with recurrent chest pain. She notes chest pressure for ~4 hours that radiates to her left shoulder the came on at rest and is not worse with exertion. She took 3 SL nitro at home without relief in her pain. She denies any lightheadedness, dizziness, palpitations, diaphoresis, shortness of breath, lower leg swelling, fevers, chills, cough or abdominal pain. She continues to smoke.   Past Medical History:  Diagnosis Date  . Anxiety   . Bronchitis    uses inhaler if needed for bronchitis, lasted used -2014  . Cataract    bilateral  . Chronic headaches    migraines - in past, uses Phenergan for nausea   . Deficiency anemia 12/30/2017  . Diabetes mellitus without complication ([place])    taken off Metformin since 04/2014, HgbA1C - normal, will follow up with PCP- Dr. Deborra Medina, 07/2014  . GERD (gastroesophageal reflux disease)   . H/O exercise stress test 2011   done at [facility]- told that it was WNL, done due to pt. having panic attacks   . History of blood transfusion November 11, 1966   at birth in [place], [place], unsure number of units  . Poor dentition    very poor oral health   . SVD (spontaneous vaginal delivery)    x 3  . Tobacco abuse     Past Surgical History:  Procedure Laterality Date  . ABDOMINAL HYSTERECTOMY    . APPENDECTOMY  1988  . CARDIAC CATHETERIZATION N/A 08/17/2016   Procedure: Left Heart Cath and Coronary Angiography;  Surgeon: Burnell Blanks, MD;  Location: [facility];  Service:  Cardiovascular;  Laterality: N/A;  . CHOLECYSTECTOMY N/A 06/07/2014   Procedure: LAPAROSCOPIC CHOLECYSTECTOMY;  Surgeon: Ralene Ok, MD;  Location: [facility];  Service: General;  Laterality: N/A;  . CHOLECYSTECTOMY  June 07 2014  . COLONOSCOPY  02/16/2014   normal   . ESOPHAGOGASTRODUODENOSCOPY  12/28/2013  . KNEE ARTHROSCOPY  1995   left  . LAPAROSCOPIC ASSISTED VAGINAL HYSTERECTOMY N/A 04/15/2014   Procedure: LAPAROSCOPIC ASSISTED VAGINAL HYSTERECTOMY;  Surgeon: Marylynn Pearson, MD;  Location: [facility];  Service: Gynecology;  Laterality: N/A;  . LAPAROSCOPIC BILATERAL SALPINGO OOPHERECTOMY Bilateral 04/15/2014   Procedure: LAPAROSCOPIC BILATERAL SALPINGO OOPHORECTOMY;  Surgeon: Marylynn Pearson, MD;  Location: [facility];  Service: Gynecology;  Laterality: Bilateral;  . TUBAL LIGATION       Home Medications:  Prior to Admission medications   Medication Sig Start Date End Date Taking? Authorizing Provider  albuterol (PROVENTIL HFA;VENTOLIN HFA) 108 (90 Base) MCG/ACT inhaler Inhale 2 puffs into the lungs every 6 (six) hours as needed for wheezing or shortness of breath.   Yes [provider]  amitriptyline (ELAVIL) 50 MG tablet Take 1.5 tablets (75 mg total) by mouth at bedtime. 11/12/18  Yes Janith Lima, MD  aspirin EC 81 MG tablet Take 1 tablet (81 mg total) by mouth daily. Patient taking differently: Take 81 mg by mouth at bedtime.  10/20/18  Yes Janith Lima, MD  BOTOX 200 units SOLR INJECT 155 UNITS IN THE MUSCLE INTO HEAD AND NECK MUSCLES EVERY 3 MONTHS BY PROVIDER Patient taking differently: Inject 155 Units into the muscle See admin instructions. Inject 155 units IM into head and neck muscles every 3 months (90 days) 01/01/19  Yes Melvenia Beam, MD  cholecalciferol (VITAMIN D3) 25 MCG (1000 UT) tablet Take 2,000 Units by mouth daily.   Yes [provider]  clonazePAM (KLONOPIN) 1 MG tablet Take 1 tablet (1 mg total) by mouth 2 (two) times daily as needed for  anxiety. Patient taking differently: Take 1 mg by mouth 2 (two) times daily.  02/24/19  Yes Janith Lima, MD  dicyclomine (BENTYL) 10 MG capsule Take 1 capsule (10 mg total) by mouth 2 (two) times daily as needed (Cramping and abdominal pain). Patient taking differently: Take 10 mg by mouth 2 (two) times a day.  04/07/19  Yes Janith Lima, MD  docusate sodium (COLACE) 100 MG capsule Take 100 mg by mouth at bedtime.   Yes [provider]  etodolac (LODINE) 400 MG tablet Take 1 tablet (400 mg total) by mouth 2 (two) times daily as needed. Patient taking differently: Take 400 mg by mouth 2 (two) times daily as needed (for back pain).  03/18/19  Yes Hilts, Legrand Como, MD  fludrocortisone (FLORINEF) 0.1 MG tablet Take 1 tablet (0.1 mg total) by mouth daily. 04/21/19  Yes Janith Lima, MD  insulin detemir (LEVEMIR) 100 UNIT/ML injection Inject 0.3 mLs (30 Units total) into the skin daily. Patient taking differently: Inject 35 Units into the skin at bedtime.  03/30/19  Yes Janith Lima, MD  metFORMIN (GLUCOPHAGE-XR) 750 MG 24 hr tablet Take 2 tablets (1,500 mg total) by mouth daily with breakfast. 04/21/19  Yes Janith Lima, MD  nitroGLYCERIN (NITROSTAT) 0.4 MG SL tablet Place 1 tablet (0.4 mg total) under the tongue every 5 (five) minutes x 3 doses as needed for chest pain. 02/18/19  Yes Elodia Florence., MD  pantoprazole (PROTONIX) 40 MG tablet Take 1 tablet (40 mg total) by mouth daily. 02/10/19  Yes Janith Lima, MD  promethazine (PHENERGAN) 25 MG tablet Take 1 tablet (25 mg total) by mouth every 6 (six) hours as needed for nausea or vomiting. 02/11/19  Yes Janith Lima, MD  rizatriptan (MAXALT-MLT) 10 MG disintegrating tablet Take 1 tablet (10 mg total) by mouth as needed for migraine. May repeat in 2 hours if needed. Do not take more than 3 tablets in a week. Patient taking differently: Take 10 mg by mouth as needed for migraine (and may repeat once in 2 hours, if no relief (MAX  OF 3 TABLETS/WK)).  10/23/18  Yes Janith Lima, MD  rosuvastatin (CRESTOR) 20 MG tablet Take 1 tablet (20 mg total) by mouth daily. 04/20/19  Yes Janith Lima, MD  traZODone (DESYREL) 150 MG tablet Take 1 tablet (150 mg total) by mouth at bedtime. 10/20/18  Yes Janith Lima, MD  venlafaxine (EFFEXOR) 37.5 MG tablet TAKE 1 TABLET BY MOUTH TWICE DAILY Patient taking differently: Take 37.5 mg by mouth 2 (two) times daily.  01/26/19  Yes Janith Lima, MD  AQUALANCE LANCETS 30G MISC  11/27/17   [provider]  BAYER CONTOUR TEST test strip Testing for up to tid 03/12/18   Janith Lima, MD  Erenumab-aooe (AIMOVIG) 140 MG/ML SOAJ Inject 140 mg into the skin every 30 (thirty) days. Patient not taking: Reported on 04/27/2019  07/02/18   Melvenia Beam, MD  Insulin Pen Needle 32G X 6 MM MISC Use daily as directed 05/12/18   Marrian Salvage, FNP  Lancet Devices (ADJUSTABLE LANCING DEVICE) MISC Up to tid testing 09/19/17   [provider]    Inpatient Medications: Scheduled Meds:  Continuous Infusions:  PRN Meds:   Allergies:    Allergies  Allergen Reactions  . Penicillins Anaphylaxis and Swelling    Throat swells Has patient had a PCN reaction causing immediate rash, facial/tongue/throat swelling, SOB or lightheadedness with hypotension: Yes Has patient had a PCN reaction causing severe rash involving mucus membranes or skin necrosis: No Has patient had a PCN reaction that required hospitalization: No Has patient had a PCN reaction occurring within the last 10 years: No If all of the above answers are "NO", then may proceed with Cephalosporin use.   . Topamax [Topiramate] Other (See Comments)    Hands and feet  go numb    Social History:   Social History   Socioeconomic History  . Marital status: Married    Spouse name: Alvester Chou  . Number of children: 6  . Years of education: 79  . Highest education level: Not on file  Occupational History  .  Occupation:      Comment: helps run a friends business  Social Needs  . Financial resource strain: Not on file  . Food insecurity:    Worry: Not on file    Inability: Not on file  . Transportation needs:    Medical: Not on file    Non-medical: Not on file  Tobacco Use  . Smoking status: Current Every Day Smoker    Packs/day: 2.00    Years: 35.00    Pack years: 70.00    Types: Cigarettes  . Smokeless tobacco: Never Used  . Tobacco comment: 08-17-17- pt reports smokes "almost 2 packs a day"  Substance and Sexual Activity  . Alcohol use: No    Alcohol/week: 0.0 standard drinks  . Drug use: No  . Sexual activity: Yes    Partners: Male    Birth control/protection: Surgical  Lifestyle  . Physical activity:    Days per week: Not on file    Minutes per session: Not on file  . Stress: Not on file  Relationships  . Social connections:    Talks on phone: Not on file    Gets together: Not on file    Attends religious service: Not on file    Active member of club or organization: Not on file    Attends meetings of clubs or organizations: Not on file    Relationship status: Not on file  . Intimate partner violence:    Fear of current or ex partner: Not on file    Emotionally abused: Not on file    Physically abused: Not on file    Forced sexual activity: Not on file  Other Topics Concern  . Not on file  Social History Narrative   5 living children, one child is a crack cocaine addict who often breaks into their house to steal money. One child died of cerebral palsy.   Caffeine use: Dr Malachi Bonds (3 per day)   2 cups coffee per day    Family History:    Family History  Problem Relation Age of Onset  . Diabetes Mother   . Hyperlipidemia Mother   . Hypertension Mother   . Anxiety disorder Mother   . Depression Mother   . Drug  abuse Mother   . Prostate cancer Father   . Alcohol abuse Father   . Lung cancer Maternal Grandfather        smoked  . Emphysema Maternal Grandfather         smoked  . COPD Maternal Grandmother        never smoked   . Dementia Maternal Grandmother   . Thyroid cancer Paternal Aunt   . Colon cancer Neg Hx   . Rectal cancer Neg Hx   . Stomach cancer Neg Hx   . Migraines Neg Hx      ROS:  Please see the history of present illness.  All other ROS reviewed and negative.     Physical Exam/Data:   Vitals:   04/27/19 2120 04/27/19 2130 04/27/19 2200 04/27/19 2300  BP: (!) 92/51 90/65 (!) 98/56 (!) 103/56  Pulse: 75 77 75 75  Resp: 14     Temp:      TempSrc:      SpO2: 96% 97% 94% 96%    Intake/Output Summary (Last 24 hours) at 04/28/2019 0547 Last data filed at 04/27/2019 2300 Gross per 24 hour  Intake 1490 ml  Output -  Net 1490 ml   Last 3 Weights 04/16/2019 03/09/2019 03/04/2019  Weight (lbs) 172 lb 8 oz 173 lb 174 lb  Weight (kg) 78.245 kg 78.472 kg 78.926 kg  Some encounter information is confidential and restricted. Go to Review Flowsheets activity to see all data.     There is no height or weight on file to calculate BMI.  Deferred due to concerns for COVID  EKG:  The EKG was personally reviewed and demonstrates:  Sinus rhythm with no ischemic changes   Relevant CV Studies: Echo 02/18/19  1. The left ventricle has normal systolic function with an ejection fraction of 60-65%. The cavity size was normal. Left ventricular diastolic parameters were normal.  2. The right ventricle has normal systolic function. The cavity was normal. There is no increase in right ventricular wall thickness.  3. The mitral valve is normal in structure.  4. The tricuspid valve is normal in structure.  5. The aortic valve is tricuspid.  6. The pulmonic valve was normal in structure.  Cath 07/2016 1. No angiographic evidence of CAD 2. Normal LV systolic function 3. Normal LV filling pressures   Laboratory Data:  Chemistry Recent Labs  Lab 04/27/19 1759  NA 138  K 4.1  CL 104  CO2 23  GLUCOSE 243*  BUN 11  CREATININE 1.02*   CALCIUM 8.7*  GFRNONAA >60  GFRAA >60  ANIONGAP 11    Recent Labs  Lab 04/27/19 1759  PROT 6.3*  ALBUMIN 3.3*  AST 16  ALT 16  ALKPHOS 84  BILITOT 0.4   Hematology Recent Labs  Lab 04/27/19 1759  WBC 9.1  RBC 4.08  HGB 12.6  HCT 38.7  MCV 94.9  MCH 30.9  MCHC 32.6  RDW 14.6  PLT 309   Cardiac Enzymes Recent Labs  Lab 04/27/19 1945 04/27/19 2054  TROPONINI <0.03 <0.03   No results for input(s): TROPIPOC in the last 168 hours.  BNPNo results for input(s): BNP, PROBNP in the last 168 hours.  DDimer No results for input(s): DDIMER in the last 168 hours.  Radiology/Studies:  Dg Chest Port 1 View  Result Date: 04/27/2019 CLINICAL DATA:  Chest pain EXAM: PORTABLE CHEST 1 VIEW COMPARISON:  Portable exam 1705 hours compared to 02/18/2019 FINDINGS: Normal heart size, mediastinal contours, and pulmonary  vascularity. Persistent bibasilar atelectasis. Lungs otherwise clear. No infiltrate, pleural effusion or pneumothorax. Bones demineralized. IMPRESSION: Persistent bibasilar atelectasis. No acute abnormalities. Electronically Signed   By: Lavonia Dana M.D.   On: 04/27/2019 17:24    Assessment and Plan:   Ashley [name] is a 53 y.o. female with a hx of hypertension, tobacco use and diabetes who is being seen today for the evaluation of chest pain. On review of her chart, she has had multiple visits for chest pain and an extensive work-up in the past. Her ECG is entirely normal and her initial troponin is negative. She had a LHC less than 3 years ago without any evidence of coronary disease. While she has HTN, HLD and tobacco use for risk factors, she is young without family history of premature coronary disease. It is also reassuring that she had no change in pain with multiple SL nitro tabs and her pain is gradually improving on its own. Given this, would favor trending her troponin and, if negative x 2, it is safe to discharge her to follow-up with her PCP.  - Repeat troponin, if  negative, ok to discharge and follow-up with PCP   For questions or updates, please contact [place] Please consult [email or web address] for contact info under     Signed, Princella Pellegrini, MD  04/28/2019 5:47 AM

## 2019-05-04 ENCOUNTER — Telehealth: Payer: Self-pay

## 2019-05-04 NOTE — Telephone Encounter (Signed)
Patient called, left VM to return call to [phone] between the hours of 7am-7pm to speak to a triage nurse.  Reference to possible exposure to [facility] employee while in the ED on 04/27/19.

## 2019-05-07 ENCOUNTER — Telehealth (HOSPITAL_COMMUNITY): Payer: Self-pay | Admitting: Emergency Medicine

## 2019-05-07 ENCOUNTER — Ambulatory Visit: Payer: Self-pay | Admitting: *Deleted

## 2019-05-07 ENCOUNTER — Encounter (HOSPITAL_COMMUNITY): Payer: Self-pay | Admitting: Emergency Medicine

## 2019-05-07 ENCOUNTER — Ambulatory Visit (INDEPENDENT_AMBULATORY_CARE_PROVIDER_SITE_OTHER): Payer: PRIVATE HEALTH INSURANCE

## 2019-05-07 ENCOUNTER — Ambulatory Visit (HOSPITAL_COMMUNITY)
Admission: EM | Admit: 2019-05-07 | Discharge: 2019-05-07 | Disposition: A | Discharge status: Home / Self Care | Payer: PRIVATE HEALTH INSURANCE | Attending: Internal Medicine | Admitting: Internal Medicine

## 2019-05-07 ENCOUNTER — Telehealth: Payer: Self-pay | Admitting: Internal Medicine

## 2019-05-07 DIAGNOSIS — S6000XA Contusion of unspecified finger without damage to nail, initial encounter: Secondary | ICD-10-CM | POA: Diagnosis not present

## 2019-05-07 NOTE — ED Triage Notes (Signed)
Pt states today she missed a step while walking and fell, landed on her R knee and L hand. States "my right knee is fine its just road rash". Pt has bruising and swelling to L hand, limited range of motion.

## 2019-05-07 NOTE — Telephone Encounter (Signed)
Routing to tammy/sched and dr john----dr Jenny Reichmann please advise if you need this visit to be in office or video---tammy can you call patient to schedule, thanks

## 2019-05-07 NOTE — Telephone Encounter (Signed)
Pt called after going outside, missed a step and fell unto the concrete. She hit her left hand and right knee. She has an abrasion on the right knee. She thinks her pinky finger may be broken. She has swelling to that hand and in between the pinky finger and ring finger, it is bruising and looks like the bruising is spreading. . It hurts to try to use that hand, can not make a ball with it.  It just happened about 30 mins ago. Advised to put ice on the on that hand and she stated she has Motrin that she can take for the pain.  She is requesting an appointment. Advised of doing a virtual appointment with her provider but she may need to be seen in a facility. Pt voiced understanding. Flow at [facility] notified regarding an appointment. Routing to the office for review and recommendation. Pt voiced understanding that she will be getting a call back.  Reason for Disposition . Finger joint can't be opened (straightened) or closed (bent) completely    (Note: injured person should be able to do this without assistance)  Answer Assessment - Initial Assessment Questions 1. MECHANISM: "How did the injury happen?"      Fell when she went outside and hit the concrete 2. ONSET: "When did the injury happen?" (Minutes or hours ago)      About 30 mins ago 3. LOCATION: "What part of the finger is injured?" "Is the nail damaged?"      Left hand and between the pinky finger and ring finger 4. APPEARANCE of the INJURY: "What does the injury look like?"      Bruising in between both fingers 5. SEVERITY: "Can you use the hand normally?"  "Can you bend your fingers into a ball and then fully open them?"     Can not make a ball without pain 6. SIZE: For cuts, bruises, or swelling, ask: "How large is it?" (e.g., inches or centimeters;  entire finger)      Bruising going down the palm  Of her hand 7. PAIN: "Is there pain?" If so, ask: "How bad is the pain?"    (e.g., Scale 1-10; or mild, moderate, severe)  Pain 3 to 8 if it moves 8. TETANUS: For any breaks in the skin, ask: "When was the last tetanus booster?"     No breaks in the skin 9. OTHER SYMPTOMS: "Do you have any other symptoms?"     On the right knee like an abrasion. 10. PREGNANCY: "Is there any chance you are pregnant?" "When was your last menstrual period?"       n/a  Protocols used: FINGER INJURY-A-AH

## 2019-05-07 NOTE — Telephone Encounter (Signed)
Pt contacted, given Dr. Carmelia Bake information and the radiology report to give to him. Pt will call and make appt tomorrow.

## 2019-05-07 NOTE — Telephone Encounter (Signed)
[facility] for [place] visit may 15, thanks

## 2019-05-07 NOTE — Telephone Encounter (Signed)
Copied from [place] [phone]. Topic: Quick Communication - See Telephone Encounter >> May 07, 2019  3:25 PM Ivar Drape wrote: CRM for notification. See Telephone encounter for: 05/07/19. Patient stated she is going to the Urgent Care for her finger because it is swelling by the minute.  She thinks it's broken.  She will let the practice know what Urgent Care says.

## 2019-05-07 NOTE — Telephone Encounter (Signed)
Please advise if patient can come in office

## 2019-05-08 ENCOUNTER — Other Ambulatory Visit: Payer: Self-pay

## 2019-05-08 ENCOUNTER — Ambulatory Visit (INDEPENDENT_AMBULATORY_CARE_PROVIDER_SITE_OTHER): Payer: PRIVATE HEALTH INSURANCE | Admitting: Family Medicine

## 2019-05-08 ENCOUNTER — Encounter: Payer: Self-pay | Admitting: Family Medicine

## 2019-05-08 VITALS — BP 114/52 | HR 97 | Resp 16 | Ht 62.0 in | Wt 175.0 lb

## 2019-05-08 DIAGNOSIS — S62617A Displaced fracture of proximal phalanx of left little finger, initial encounter for closed fracture: Secondary | ICD-10-CM | POA: Insufficient documentation

## 2019-05-08 MED ORDER — NAPROXEN 500 MG PO TABS: 500.0000 mg | ORAL_TABLET | Freq: Two times a day (BID) | ORAL | 0 refills | Status: DC | PRN

## 2019-05-08 NOTE — Telephone Encounter (Signed)
We are calling patient this morning to see how she is feeling--see other telephone encounter

## 2019-05-08 NOTE — Telephone Encounter (Signed)
Patient seeing Raeford Razor today

## 2019-05-08 NOTE — Assessment & Plan Note (Signed)
Mildly displaced oblique fracture at the base of the fifth finger. -Buddy tape today -We will refer to hand surgery

## 2019-05-08 NOTE — Progress Notes (Signed)
Ashley [name] - 53 y.o. female MRN [id number]  Date of birth: 05-Nov-1966  SUBJECTIVE:  Including CC & ROS.  No chief complaint on file.   Ashley [name] is a 53 y.o. female that is presenting with left finger pain.  She had a fall down the stairs and hurt her finger.  She is unsure the mechanism of her injury.  She was evaluated the urgent care yesterday and diagnosed a broken finger.  She has pain at the base of the pinky finger on the left hand.  She has had swelling and ecchymosis.  She denies any significant altered sensation.  Has limited range of motion secondary to pain and swelling.  Independent review of the left hand x-ray from 5/14 shows a oblique mildly displaced fracture of the base of the fifth proximal phalanx.   Review of Systems  Constitutional: Negative for fever.  HENT: Negative for congestion.   Respiratory: Negative for cough.   Cardiovascular: Negative for chest pain.  Gastrointestinal: Negative for abdominal pain.  Musculoskeletal: Negative for gait problem.  Skin: Positive for color change.  Neurological: Negative for weakness.  Hematological: Negative for adenopathy.    HISTORY: Past Medical, Surgical, Social, and Family History Reviewed & Updated per EMR.   Pertinent Historical Findings include:  Past Medical History:  Diagnosis Date  . Anxiety   . Bronchitis    uses inhaler if needed for bronchitis, lasted used -2014  . Cataract    bilateral  . Chronic headaches    migraines - in past, uses Phenergan for nausea   . Deficiency anemia 12/30/2017  . Diabetes mellitus without complication ([place])    taken off Metformin since 04/2014, HgbA1C - normal, will follow up with PCP- Dr. Deborra Medina, 07/2014  . GERD (gastroesophageal reflux disease)   . H/O exercise stress test 2011   done at [facility]- told that it was WNL, done due to pt. having panic attacks   . History of blood transfusion [birth year] 08, 1966   at birth in [place], [place], unsure number of units  . Poor dentition    very poor oral health   . SVD (spontaneous vaginal delivery)    x 3  . Tobacco abuse     Past Surgical History:  Procedure Laterality Date  . ABDOMINAL HYSTERECTOMY    . APPENDECTOMY  1988  . CARDIAC CATHETERIZATION N/A 08/17/2016   Procedure: Left Heart Cath and Coronary Angiography;  Surgeon: Burnell Blanks, MD;  Location: [facility];  Service: Cardiovascular;  Laterality: N/A;  . CHOLECYSTECTOMY N/A 06/07/2014   Procedure: LAPAROSCOPIC CHOLECYSTECTOMY;  Surgeon: Ralene Ok, MD;  Location: [facility];  Service: General;  Laterality: N/A;  . CHOLECYSTECTOMY  June 07 2014  . COLONOSCOPY  02/16/2014   normal   . ESOPHAGOGASTRODUODENOSCOPY  12/28/2013  . KNEE ARTHROSCOPY  1995   left  . LAPAROSCOPIC ASSISTED VAGINAL HYSTERECTOMY N/A 04/15/2014   Procedure: LAPAROSCOPIC ASSISTED VAGINAL HYSTERECTOMY;  Surgeon: Marylynn Pearson, MD;  Location: [facility];  Service: Gynecology;  Laterality: N/A;  . LAPAROSCOPIC BILATERAL SALPINGO OOPHERECTOMY Bilateral 04/15/2014   Procedure: LAPAROSCOPIC BILATERAL SALPINGO OOPHORECTOMY;  Surgeon: Marylynn Pearson, MD;  Location: [facility];  Service: Gynecology;  Laterality: Bilateral;  . TUBAL LIGATION      Allergies  Allergen Reactions  . Penicillins Anaphylaxis and Swelling    Throat swells Has patient had a PCN reaction causing immediate rash, facial/tongue/throat swelling, SOB or lightheadedness with hypotension: Yes Has patient had a PCN reaction causing severe rash involving mucus  membranes or skin necrosis: No Has patient had a PCN reaction that required hospitalization: No Has patient had a PCN reaction occurring within the last 10 years: No If all of the above answers are "NO", then may proceed with Cephalosporin use.   . Topamax [Topiramate] Other (See Comments)    Hands and feet  go numb    Family History  Problem Relation Age of Onset  . Diabetes Mother   . Hyperlipidemia Mother   . Hypertension Mother   . Anxiety disorder  Mother   . Depression Mother   . Drug abuse Mother   . Prostate cancer Father   . Alcohol abuse Father   . Lung cancer Maternal Grandfather        smoked  . Emphysema Maternal Grandfather        smoked  . COPD Maternal Grandmother        never smoked   . Dementia Maternal Grandmother   . Thyroid cancer Paternal Aunt   . Colon cancer Neg Hx   . Rectal cancer Neg Hx   . Stomach cancer Neg Hx   . Migraines Neg Hx      Social History   Socioeconomic History  . Marital status: Married    Spouse name: Alvester Chou  . Number of children: 6  . Years of education: 64  . Highest education level: Not on file  Occupational History  . Occupation:      Comment: helps run a friends business  Social Needs  . Financial resource strain: Not on file  . Food insecurity:    Worry: Not on file    Inability: Not on file  . Transportation needs:    Medical: Not on file    Non-medical: Not on file  Tobacco Use  . Smoking status: Current Every Day Smoker    Packs/day: 2.00    Years: 35.00    Pack years: 70.00    Types: Cigarettes  . Smokeless tobacco: Never Used  . Tobacco comment: 08-17-17- pt reports smokes "almost 2 packs a day"  Substance and Sexual Activity  . Alcohol use: No    Alcohol/week: 0.0 standard drinks  . Drug use: No  . Sexual activity: Yes    Partners: Male    Birth control/protection: Surgical  Lifestyle  . Physical activity:    Days per week: Not on file    Minutes per session: Not on file  . Stress: Not on file  Relationships  . Social connections:    Talks on phone: Not on file    Gets together: Not on file    Attends religious service: Not on file    Active member of club or organization: Not on file    Attends meetings of clubs or organizations: Not on file    Relationship status: Not on file  . Intimate partner violence:    Fear of current or ex partner: Not on file    Emotionally abused: Not on file    Physically abused: Not on file    Forced sexual  activity: Not on file  Other Topics Concern  . Not on file  Social History Narrative   5 living children, one child is a crack cocaine addict who often breaks into their house to steal money. One child died of cerebral palsy.   Caffeine use: Dr Malachi Bonds (3 per day)   2 cups coffee per day     PHYSICAL EXAM:  VS: BP (!) 114/52   Pulse 97  Resp 16   Ht 5\' 2"  (1.575 m)   Wt 175 lb (79.4 kg)   LMP 02/12/2014   SpO2 97%   BMI 32.01 kg/m  Physical Exam Gen: NAD, alert, cooperative with exam, well-appearing ENT: normal lips, normal nasal mucosa,  Eye: normal EOM, normal conjunctiva and lids CV:  no edema, +2 pedal pulses   Resp: no accessory muscle use, non-labored,  Skin: no rashes, no areas of induration  Neuro: normal tone, normal sensation to touch Psych:  normal insight, alert and oriented MSK:  Left hand:  Ecchymosis and swelling of the 5th digit  Limited flexion and extension of the 5th digit  TTP of the proximal phalange.  Neurovascularly intact      ASSESSMENT & PLAN:   Closed displaced fracture of proximal phalanx of left little finger Mildly displaced oblique fracture at the base of the fifth finger. -Buddy tape today -We will refer to hand surgery

## 2019-05-08 NOTE — Telephone Encounter (Signed)
Routing to sam/sched---can you please call patient to see if she would like to come in for IN OFFICE visit? And if yes, schedule with dr Jenny Reichmann today, thanks

## 2019-05-08 NOTE — Patient Instructions (Addendum)
Good to see you Please keep your fingers taped together Please take tylenol and naproxen for pain  Please ice the area Please take vitamin D  Please send me a message in MyChart with any questions or updates.  We will send you to the hand surgeon.   --Dr. Raeford Razor

## 2019-05-12 ENCOUNTER — Ambulatory Visit: Payer: Self-pay

## 2019-05-12 ENCOUNTER — Other Ambulatory Visit: Payer: Self-pay

## 2019-05-12 ENCOUNTER — Ambulatory Visit: Payer: PRIVATE HEALTH INSURANCE | Admitting: Family Medicine

## 2019-05-12 ENCOUNTER — Encounter: Payer: Self-pay | Admitting: Family Medicine

## 2019-05-12 ENCOUNTER — Other Ambulatory Visit: Payer: Self-pay | Admitting: Internal Medicine

## 2019-05-12 DIAGNOSIS — S62647A Nondisplaced fracture of proximal phalanx of left little finger, initial encounter for closed fracture: Secondary | ICD-10-CM | POA: Diagnosis not present

## 2019-05-12 DIAGNOSIS — G44011 Episodic cluster headache, intractable: Secondary | ICD-10-CM

## 2019-05-12 MED ORDER — AMITRIPTYLINE HCL 50 MG PO TABS: 75.0000 mg | ORAL_TABLET | Freq: Every day | ORAL | 1 refills | Status: DC

## 2019-05-12 NOTE — Progress Notes (Signed)
I saw and examined the patient with Dr. Okey Dupre and agree with assessment and plan as outlined.  Left 5th proximal phalanx fracture stable, good alignment.  Ulnar gutter splint, recheck and x-rays in about 10 days.

## 2019-05-12 NOTE — ED Provider Notes (Signed)
[facility]    CSN: [id number] Arrival date & time: 05/07/19  1529     History   Chief Complaint Chief Complaint  Patient presents with  . Fall  . Hand Injury    HPI Ashley [name] is a 53 y.o. female with a history of diabetes mellitus type 2 comes to urgent care after she sustained a mechanical fall at home today.  Patient apparently missed a step and lost her balance.  She ended up falling.  He denied hitting her head or passing out.  No dizziness or lightheadedness prior to falling.  Patient is complaining of left hand pain as well as right knee pain.  No swelling in the right knee.  Right knee pain has improved significantly.  Patient has left hand pain with decreased range of motion. Past Medical History:  Diagnosis Date  . Anxiety   . Bronchitis    uses inhaler if needed for bronchitis, lasted used -2014  . Cataract    bilateral  . Chronic headaches    migraines - in past, uses Phenergan for nausea   . Deficiency anemia 12/30/2017  . Diabetes mellitus without complication ([place])    taken off Metformin since 04/2014, HgbA1C - normal, will follow up with PCP- Dr. Deborra Medina, 07/2014  . GERD (gastroesophageal reflux disease)   . H/O exercise stress test 2011   done at [facility]- told that it was WNL, done due to pt. having panic attacks   . History of blood transfusion 10/25/66   at birth in [place], [place], unsure number of units  . Poor dentition    very poor oral health   . SVD (spontaneous vaginal delivery)    x 3  . Tobacco abuse     Patient Active Problem List   Diagnosis Date Noted  . Closed displaced fracture of proximal phalanx of left little finger 05/08/2019  . Hypotension 03/04/2019  . Abnormal chest x-ray with multiple lung nodules 03/04/2019  . Chest pain 02/17/2019  . Lumbar radiculitis 02/10/2019  . Gastroesophageal reflux disease with esophagitis 02/10/2019  . Essential hypertension 10/20/2018  . Chronic migraine without aura, with intractable  migraine, so stated, with status migrainosus 07/02/2018  . Thiamine deficiency 01/05/2018  . Claudication of both lower extremities ([place]) 12/30/2017  . Constipation 09/12/2017  . Moderate episode of recurrent major depressive disorder ([place]) 05/16/2017  . Diabetes mellitus with complication ([place])   . 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 Tobacco abuse 01/08/2017  . Chronic bronchitis ([place]) 10/20/2015  . Dyslipidemia, goal LDL below 70 04/12/2015  . Migraine with aura, intractable 08/12/2014  . Routine general medical examination at a health care facility 05/24/2011  . Anxiety state 09/30/2009  . INSOMNIA, CHRONIC 09/30/2009    Past Surgical History:  Procedure Laterality Date  . ABDOMINAL HYSTERECTOMY    . APPENDECTOMY  1988  . CARDIAC CATHETERIZATION N/A 08/17/2016   Procedure: Left Heart Cath and Coronary Angiography;  Surgeon: Burnell Blanks, MD;  Location: [facility];  Service: Cardiovascular;  Laterality: N/A;  . CHOLECYSTECTOMY N/A 06/07/2014   Procedure: LAPAROSCOPIC CHOLECYSTECTOMY;  Surgeon: Ralene Ok, MD;  Location: [facility];  Service: General;  Laterality: N/A;  . CHOLECYSTECTOMY  June 07 2014  . COLONOSCOPY  02/16/2014   normal   . ESOPHAGOGASTRODUODENOSCOPY  12/28/2013  . KNEE ARTHROSCOPY  1995   left  . LAPAROSCOPIC ASSISTED VAGINAL HYSTERECTOMY N/A 04/15/2014   Procedure: LAPAROSCOPIC ASSISTED VAGINAL HYSTERECTOMY;  Surgeon: Marylynn Pearson, MD;  Location: [facility];  Service: Gynecology;  Laterality: N/A;  . LAPAROSCOPIC BILATERAL SALPINGO OOPHERECTOMY Bilateral 04/15/2014   Procedure: LAPAROSCOPIC BILATERAL SALPINGO OOPHORECTOMY;  Surgeon: Marylynn Pearson, MD;  Location: [facility];  Service: Gynecology;  Laterality: Bilateral;  . TUBAL LIGATION      OB History   No obstetric history on file.      Home Medications    Prior to Admission medications   Medication Sig Start Date End Date Taking? Authorizing Provider  albuterol (PROVENTIL HFA;VENTOLIN  HFA) 108 (90 Base) MCG/ACT inhaler Inhale 2 puffs into the lungs every 6 (six) hours as needed for wheezing or shortness of breath.    [provider]  amitriptyline (ELAVIL) 50 MG tablet Take 1.5 tablets (75 mg total) by mouth at bedtime. 05/12/19   Janith Lima, MD  AQUALANCE LANCETS 30G MISC  11/27/17   [provider]  aspirin EC 81 MG tablet Take 1 tablet (81 mg total) by mouth daily. Patient taking differently: Take 81 mg by mouth at bedtime.  10/20/18   Janith Lima, MD  BAYER CONTOUR TEST test strip Testing for up to tid 03/12/18   Janith Lima, MD  BOTOX 200 units SOLR INJECT 155 UNITS IN THE MUSCLE INTO HEAD AND NECK MUSCLES EVERY 3 MONTHS BY PROVIDER Patient taking differently: Inject 155 Units into the muscle See admin instructions. Inject 155 units IM into head and neck muscles every 3 months (90 days) 01/01/19   Melvenia Beam, MD  cholecalciferol (VITAMIN D3) 25 MCG (1000 UT) tablet Take 2,000 Units by mouth daily.    [provider]  clonazePAM (KLONOPIN) 1 MG tablet Take 1 tablet (1 mg total) by mouth 2 (two) times daily as needed for anxiety. Patient taking differently: Take 1 mg by mouth 2 (two) times daily.  02/24/19   Janith Lima, MD  dicyclomine (BENTYL) 10 MG capsule Take 1 capsule (10 mg total) by mouth 2 (two) times daily as needed (Cramping and abdominal pain). Patient taking differently: Take 10 mg by mouth 2 (two) times a day.  04/07/19   Janith Lima, MD  docusate sodium (COLACE) 100 MG capsule Take 100 mg by mouth at bedtime.    [provider]  Erenumab-aooe (AIMOVIG) 140 MG/ML SOAJ Inject 140 mg into the skin every 30 (thirty) days. 07/02/18   Melvenia Beam, MD  etodolac (LODINE) 400 MG tablet Take 1 tablet (400 mg total) by mouth 2 (two) times daily as needed. Patient taking differently: Take 400 mg by mouth 2 (two) times daily as needed (for back pain).  03/18/19   Hilts, Legrand Como, MD  fludrocortisone (FLORINEF) 0.1 MG  tablet Take 1 tablet (0.1 mg total) by mouth daily. 04/21/19   Janith Lima, MD  insulin detemir (LEVEMIR) 100 UNIT/ML injection Inject 0.3 mLs (30 Units total) into the skin daily. Patient taking differently: Inject 35 Units into the skin at bedtime.  03/30/19   Janith Lima, MD  Insulin Pen Needle 32G X 6 MM MISC Use daily as directed 05/12/18   Marrian Salvage, FNP  Lancet Devices (ADJUSTABLE LANCING DEVICE) MISC Up to tid testing 09/19/17   [provider]  metFORMIN (GLUCOPHAGE-XR) 750 MG 24 hr tablet Take 2 tablets (1,500 mg total) by mouth daily with breakfast. 04/21/19   Janith Lima, MD  naproxen (NAPROSYN) 500 MG tablet Take 1 tablet (500 mg total) by mouth 2 (two) times daily as needed. Patient not taking: Reported on 05/12/2019 05/08/19 05/07/20  Raeford Razor,  Enid Baas, MD  nitroGLYCERIN (NITROSTAT) 0.4 MG SL tablet Place 1 tablet (0.4 mg total) under the tongue every 5 (five) minutes x 3 doses as needed for chest pain. 02/18/19   Elodia Florence., MD  pantoprazole (PROTONIX) 40 MG tablet Take 1 tablet (40 mg total) by mouth daily. 02/10/19   Janith Lima, MD  promethazine (PHENERGAN) 25 MG tablet Take 1 tablet (25 mg total) by mouth every 6 (six) hours as needed for nausea or vomiting. 02/11/19   Janith Lima, MD  rizatriptan (MAXALT-MLT) 10 MG disintegrating tablet Take 1 tablet (10 mg total) by mouth as needed for migraine. May repeat in 2 hours if needed. Do not take more than 3 tablets in a week. Patient taking differently: Take 10 mg by mouth as needed for migraine (and may repeat once in 2 hours, if no relief (MAX OF 3 TABLETS/WK)).  10/23/18   Janith Lima, MD  rosuvastatin (CRESTOR) 20 MG tablet Take 1 tablet (20 mg total) by mouth daily. 04/20/19   Janith Lima, MD  traZODone (DESYREL) 150 MG tablet Take 1 tablet (150 mg total) by mouth at bedtime. 10/20/18   Janith Lima, MD  venlafaxine (EFFEXOR) 37.5 MG tablet TAKE 1 TABLET BY MOUTH TWICE DAILY  Patient taking differently: Take 37.5 mg by mouth 2 (two) times daily.  01/26/19   Janith Lima, MD    Family History Family History  Problem Relation Age of Onset  . Diabetes Mother   . Hyperlipidemia Mother   . Hypertension Mother   . Anxiety disorder Mother   . Depression Mother   . Drug abuse Mother   . Prostate cancer Father   . Alcohol abuse Father   . Lung cancer Maternal Grandfather        smoked  . Emphysema Maternal Grandfather        smoked  . COPD Maternal Grandmother        never smoked   . Dementia Maternal Grandmother   . Thyroid cancer Paternal Aunt   . Colon cancer Neg Hx   . Rectal cancer Neg Hx   . Stomach cancer Neg Hx   . Migraines Neg Hx     Social History Social History   Tobacco Use  . Smoking status: Current Every Day Smoker    Packs/day: 2.00    Years: 35.00    Pack years: 70.00    Types: Cigarettes  . Smokeless tobacco: Never Used  . Tobacco comment: 08-17-17- pt reports smokes "almost 2 packs a day"  Substance Use Topics  . Alcohol use: No    Alcohol/week: 0.0 standard drinks  . Drug use: No     Allergies   Penicillins and Topamax [topiramate]   Review of Systems Review of Systems  Constitutional: Positive for activity change. Negative for appetite change, fatigue and fever.  HENT: Negative.   Respiratory: Negative.   Cardiovascular: Negative.   Gastrointestinal: Negative.   Endocrine: Negative.   Genitourinary: Negative.   Musculoskeletal: Positive for arthralgias, joint swelling and myalgias. Negative for back pain and gait problem.  All other systems reviewed and are negative.    Physical Exam Triage Vital Signs ED Triage Vitals [05/07/19 1628]  Enc Vitals Group     BP 104/61     Pulse Rate 73     Resp 18     Temp 98.2 F (36.8 C)     Temp src      SpO2 99 %  Weight      Height      Head Circumference      Peak Flow      Pain Score 8     Pain Loc      Pain Edu?      Excl. in [place]?    No data found.   Updated Vital Signs BP 104/61   Pulse 73   Temp 98.2 F (36.8 C)   Resp 18   LMP 02/12/2014   SpO2 99%   Visual Acuity Right Eye Distance:   Left Eye Distance:   Bilateral Distance:    Right Eye Near:   Left Eye Near:    Bilateral Near:     Physical Exam Constitutional:      General: She is in acute distress.     Appearance: Normal appearance.  HENT:     Mouth/Throat:     Mouth: Mucous membranes are moist.     Pharynx: Oropharynx is clear.  Eyes:     General:        Right eye: No discharge.        Left eye: No discharge.     Conjunctiva/sclera: Conjunctivae normal.  Pulmonary:     Effort: Pulmonary effort is normal.     Breath sounds: Normal breath sounds.  Abdominal:     General: Bowel sounds are normal.     Palpations: Abdomen is soft.  Musculoskeletal:        General: Swelling present.     Comments: Left hand swelling at the fifth metacarpophalangeal joint.  Hematoma observed in that area.  Limited range of motion with severe pain.  Skin:    General: Skin is warm.     Capillary Refill: Capillary refill takes less than 2 seconds.  Neurological:     General: No focal deficit present.     Mental Status: She is alert and oriented to person, place, and time.      UC Treatments / Results  Labs (all labs ordered are listed, but only abnormal results are displayed) Labs Reviewed - No data to display  EKG None  Radiology No results found.  Procedures Procedures (including critical care time)  Medications Ordered in UC Medications - No data to display  Initial Impression / Assessment and Plan / UC Course  I have reviewed the triage vital signs and the nursing notes.  Pertinent labs & imaging results that were available during my care of the patient were reviewed by me and considered in my medical decision making (see chart for details).     1.  Left hand pain following a fall: X-ray of the left hand was independently reviewed by me.  I did not  identify any fracture.  Official radiology read was remarkable for acute fracture involving the base of the fifth proximal phalanx. Patient was referred to hand surgeon. Pain management Right knee is without swelling.  She has full range of motion in the right knee. Final Clinical Impressions(s) / UC Diagnoses   Final diagnoses:  Contusion of finger of right hand, unspecified finger, initial encounter   Discharge Instructions   None    ED Prescriptions    None     Controlled Substance Prescriptions [place] Controlled Substance Registry consulted? No   Chase Picket, MD 05/12/19 1616

## 2019-05-12 NOTE — Progress Notes (Signed)
  Ashley [name] - 53 y.o. female MRN [id number]  Date of birth: 1966-05-17    SUBJECTIVE:      Chief Complaint: Left hand fracture  HPI:  53 year old female presents with left little finger pain following a fall.  She fell down some stairs on 05/07/2019.  She seen at urgent care where x-rays were performed.  She was discharged with buddy tape and referred for surgical evaluation.  Today she reports 6/10 pain at the fifth MCP.  She reports swelling, bruising, and difficulty trying to move her finger.  She has been taking ketorolac which is not helpful for pain.  She denies any numbness or tingling.   ROS:     See HPI. All other reviewed systems negative.  PERTINENT  PMH / PSH FH / / SH:  Past Medical, Surgical, Social, and Family History Reviewed & Updated in the EMR.  Pertinent findings include:  Tobacco use  OBJECTIVE: LMP 02/12/2014   Physical Exam:  Vital signs are reviewed.  GEN: Alert and oriented, NAD Pulm: Breathing unlabored PSY: normal mood, congruent affect  MSK: Left wrist/Hand: Inspection: Swelling of the little finger.  Bruising at the fifth MCP and extending into the hand and fourth finger. Palpation: Tenderness at the proximal phalanx of the fifth finger.  No tenderness over the metacarpal ROM: Full range of motion of fingers 1 through 4.  Limited movement of fifth digit.  The flexor and extensor tendons are intact. Strength: Deferred due to pain Neurovascular: NV intact Special tests: Negative finkelstein's, negative tinel's at the carpal tunnel, negative phalens  Right hand: No deformity swelling No tenderness Full range of motion 5/5 strength N/V intact   ASSESSMENT & PLAN:  1.  Closed, nondisplaced fracture of the proximal phalanx of the little finger, left hand- repeat x-rays obtained today which shows stable fracture -Patient placed in ulnar gutter for better immobilization which will likely give her better pain control - Continue ketorolac and Tylenol  as needed -Encouraged cessation of smoking -Follow-up in 2 weeks for repeat x-ray

## 2019-05-21 ENCOUNTER — Ambulatory Visit: Payer: Self-pay | Admitting: Psychology

## 2019-05-25 ENCOUNTER — Other Ambulatory Visit: Payer: Self-pay

## 2019-05-25 ENCOUNTER — Encounter: Payer: PRIVATE HEALTH INSURANCE | Attending: Psychology | Admitting: Psychology

## 2019-05-25 ENCOUNTER — Telehealth: Payer: Self-pay | Admitting: Neurology

## 2019-05-25 ENCOUNTER — Encounter: Payer: Self-pay | Admitting: Psychology

## 2019-05-25 DIAGNOSIS — I10 Essential (primary) hypertension: Secondary | ICD-10-CM

## 2019-05-25 DIAGNOSIS — R413 Other amnesia: Secondary | ICD-10-CM | POA: Diagnosis not present

## 2019-05-25 NOTE — Telephone Encounter (Signed)
Pt called in wanting to discuss report from Dr Percival Spanish and discuss current medications

## 2019-05-25 NOTE — Telephone Encounter (Signed)
Lovena Le, do you mind scheduling a follow up for this patient? I'd wait until July and have her come to the office. Thanks! Dr. Loni Muse

## 2019-05-25 NOTE — Progress Notes (Signed)
Today I provided feedback regarding the results of the current neuropsychological evaluation.  This was a [employer] where I provided feedback to the patient as she was having a severe migraine today and did not feel like she was able to make it into the office.  Below is the summary and results of the recent neuropsychological evaluation that can be found in its full complete form on the visit dated 04/06/2019.  Patient also talked about some symptoms that she has had since I saw her last.  The patient describes symptoms related to syncopal events and falling and coordination issues in which she is following up at least 1 time resulting in a broken finger.  The patient described twitching muscles and twitching in her eye, heavy sweating, loss of muscle coordination and her neck muscles pulling her head down or to the side.  I did send a note to Dr. Jaynee Eagles and asked patient to contact Dr. Scarlette Calico about this issue.      Summary of Results:                        Overall, the results of the current neuropsychological evaluation are consistent with some objective findings of difficulties with regard to visual spatial/visual reasoning abilities, overall mental processing/information processing speed (focus execute abilities), and specific issues with visual memory and learning.  However, much of her other areas of cognitive functioning were in the average range and consistent with what would be predicted based on her education and occupational history.  For example, the patient did well with regard to verbal comprehension abilities including verbal reasoning and problem-solving abilities, vocabulary, general fund of information, and social judgment and comprehension abilities.  The patient also did well on some types of visual-spatial abilities including the ability to identify anomalies within a visual gestalt.  The patient did adequately and only slightly below predicted levels with regard to auditory and  visual working memories with auditory encoding doing better.  While the patient showed some mild difficulties with regard to immediate learning and memory almost all these difficulties had to do with her visual memory abilities.  The patient did very well with regard to delayed memory both auditorily and visually.  This pattern strongly suggest that once information is encoded and stored that it is available for retrieval later.  This suggest that this is more of an encoding difficulty than an actual organization or storage and retrieval deficit.  Impression/Diagnosis:                     Overall, the results of the current neuropsychological evaluation are not consistent with patterns typically identified are seen with degenerative cortical dementia such as Alzheimer's and do have a heavy loading on mild to moderate difficulties for right hemispheric function versus left hemispheric function.  There does appear to be some acute nature to some of her difficulties particularly with her falls and difficulty with ambulation and there may be a vascular component to this related to her migraine history.  The patient is shown no signs of stroke through multiple CT scans and at least one MRI scan.  The patient also has a history of anxiety and hyperfocus on her abilities and this anxiety may be causing some of the issues with her expressive language abilities.  While the overall results are not consistent with any cortically mediated degenerative dementia it is possible that we are very early in this process.  If  the patient does continue to deteriorate and report increasing problems and difficulties that could not be explained by acute vascular changes it would be appropriate to reevaluate her in approximately 1 year to assess for progressive changes and further rule out the possibility of any type of degenerative condition.  The patient did show some clear deficits with regard to overall information processing  speed as well as visual learning and memory and visual-spatial abilities and this does have a differential loading on right hemisphere versus left hemisphere functioning.  Besides possible issues related to acute vascular events around her migraine events it is also possible that her history of thiamine deficiency could be theoretically playing a role in her memory issues although this is being addressed more acutely and the thiamine deficiency was very mild and identified a year ago.  I will provide feedback regarding the results of the current neuropsychological evaluation.  If you have any further questions regarding this evaluation please feel free to contact me at any time.  Diagnosis:                               Axis I: Memory loss  Essential hypertension   Ilean Skill, Psy.D. Neuropsychologist

## 2019-05-26 ENCOUNTER — Ambulatory Visit (INDEPENDENT_AMBULATORY_CARE_PROVIDER_SITE_OTHER): Payer: PRIVATE HEALTH INSURANCE | Admitting: Family Medicine

## 2019-05-26 ENCOUNTER — Other Ambulatory Visit: Payer: Self-pay

## 2019-05-26 ENCOUNTER — Ambulatory Visit: Payer: Self-pay

## 2019-05-26 ENCOUNTER — Encounter: Payer: Self-pay | Admitting: Family Medicine

## 2019-05-26 DIAGNOSIS — S62647D Nondisplaced fracture of proximal phalanx of left little finger, subsequent encounter for fracture with routine healing: Secondary | ICD-10-CM | POA: Diagnosis not present

## 2019-05-26 NOTE — Progress Notes (Signed)
Office Visit Note   Patient: Ashley [name]           Date of Birth: 09/03/1966           MRN: [id number] Visit Date: 05/26/2019 Requested by: Janith Lima, MD [address], [place] [address] PCP: Janith Lima, MD  Subjective: Chief Complaint  Patient presents with  . Left Little Finger - Follow-up    DOI 05/12/2019 Been in ulnar gutter splint - removed a couple to wash hand. Still painful. Still a little bruised.    HPI: She has had 2 weeks status post fall resulting in left fifth finger proximal phalanx fracture.  Doing well in her ulnar gutter splint.  Still having pain, but tolerable.              ROS: Denies fevers or chills.  All other systems were reviewed and are negative.  Objective: Vital Signs: LMP 02/12/2014   Physical Exam:  General:  Alert and oriented, in no acute distress. Pulm:  Breathing unlabored. Psy:  Normal mood, congruent affect. Skin: No skin breakdown. Left hand: Still has tenderness at the fifth finger proximal phalanx but no significant swelling.  She has stiffness in her finger, able to adduct and AB duct actively.  Imaging: X-rays left fifth finger: Stable alignment of the proximal phalanx fracture, possibly some early callus formation.  Assessment & Plan: 1.  Stable 2 weeks status post left fifth finger proximal phalanx fracture. -Continue with ulnar gutter splint, remove it daily to work on range of motion as pain permits.  Follow-up in 2 weeks for repeat x-ray.  May be able to discontinue splint at that point.     Procedures: No procedures performed  No notes on file     PMFS History: Patient Active Problem List   Diagnosis Date Noted  . Closed displaced fracture of proximal phalanx of left little finger 05/08/2019  . Hypotension 03/04/2019  . Abnormal chest x-ray with multiple lung nodules 03/04/2019  . Chest pain 02/17/2019  . Lumbar radiculitis 02/10/2019  . Gastroesophageal reflux disease with  esophagitis 02/10/2019  . Essential hypertension 10/20/2018  . Chronic migraine without aura, with intractable migraine, so stated, with status migrainosus 07/02/2018  . Thiamine deficiency 01/05/2018  . Claudication of both lower extremities ([place]) 12/30/2017  . Constipation 09/12/2017  . Moderate episode of recurrent major depressive disorder ([place]) 05/16/2017  . Diabetes mellitus with complication ([place])   . 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 Tobacco abuse 01/08/2017  . Chronic bronchitis ([place]) 10/20/2015  . Dyslipidemia, goal LDL below 70 04/12/2015  . Migraine with aura, intractable 08/12/2014  . Routine general medical examination at a health care facility 05/24/2011  . Anxiety state 09/30/2009  . INSOMNIA, CHRONIC 09/30/2009   Past Medical History:  Diagnosis Date  . Anxiety   . Bronchitis    uses inhaler if needed for bronchitis, lasted used -2014  . Cataract    bilateral  . Chronic headaches    migraines - in past, uses Phenergan for nausea   . Deficiency anemia 12/30/2017  . Diabetes mellitus without complication ([place])    taken off Metformin since 04/2014, HgbA1C - normal, will follow up with PCP- Dr. Deborra Medina, 07/2014  . GERD (gastroesophageal reflux disease)   . H/O exercise stress test 2011   done at [facility]- told that it was WNL, done due to pt. having panic attacks   . History of blood transfusion 20-Jun-1966   at birth  in [place], [place], unsure number of units  . Poor dentition    very poor oral health   . SVD (spontaneous vaginal delivery)    x 3  . Tobacco abuse     Family History  Problem Relation Age of Onset  . Diabetes Mother   . Hyperlipidemia Mother   . Hypertension Mother   . Anxiety disorder Mother   . Depression Mother   . Drug abuse Mother   . Prostate cancer Father   . Alcohol abuse Father   . Lung cancer Maternal Grandfather        smoked  . Emphysema Maternal Grandfather        smoked  . COPD Maternal Grandmother        never smoked   .  Dementia Maternal Grandmother   . Thyroid cancer Paternal Aunt   . Colon cancer Neg Hx   . Rectal cancer Neg Hx   . Stomach cancer Neg Hx   . Migraines Neg Hx     Past Surgical History:  Procedure Laterality Date  . ABDOMINAL HYSTERECTOMY    . APPENDECTOMY  1988  . CARDIAC CATHETERIZATION N/A 08/17/2016   Procedure: Left Heart Cath and Coronary Angiography;  Surgeon: Burnell Blanks, MD;  Location: [facility];  Service: Cardiovascular;  Laterality: N/A;  . CHOLECYSTECTOMY N/A 06/07/2014   Procedure: LAPAROSCOPIC CHOLECYSTECTOMY;  Surgeon: Ralene Ok, MD;  Location: [facility];  Service: General;  Laterality: N/A;  . CHOLECYSTECTOMY  June 07 2014  . COLONOSCOPY  02/16/2014   normal   . ESOPHAGOGASTRODUODENOSCOPY  12/28/2013  . KNEE ARTHROSCOPY  1995   left  . LAPAROSCOPIC ASSISTED VAGINAL HYSTERECTOMY N/A 04/15/2014   Procedure: LAPAROSCOPIC ASSISTED VAGINAL HYSTERECTOMY;  Surgeon: Marylynn Pearson, MD;  Location: [facility];  Service: Gynecology;  Laterality: N/A;  . LAPAROSCOPIC BILATERAL SALPINGO OOPHERECTOMY Bilateral 04/15/2014   Procedure: LAPAROSCOPIC BILATERAL SALPINGO OOPHORECTOMY;  Surgeon: Marylynn Pearson, MD;  Location: [facility];  Service: Gynecology;  Laterality: Bilateral;  . TUBAL LIGATION     Social History   Occupational History  . Occupation:      Comment: helps run a friends business  Tobacco Use  . Smoking status: Current Every Day Smoker    Packs/day: 2.00    Years: 35.00    Pack years: 70.00    Types: Cigarettes  . Smokeless tobacco: Never Used  . Tobacco comment: 08-17-17- pt reports smokes "almost 2 packs a day"  Substance and Sexual Activity  . Alcohol use: No    Alcohol/week: 0.0 standard drinks  . Drug use: No  . Sexual activity: Yes    Partners: Male    Birth control/protection: Surgical

## 2019-05-27 NOTE — Telephone Encounter (Signed)
Called patient and LVM asking her to call us back to schedule a follow-up in July.

## 2019-06-09 ENCOUNTER — Encounter: Payer: Self-pay | Admitting: Family Medicine

## 2019-06-09 ENCOUNTER — Ambulatory Visit (INDEPENDENT_AMBULATORY_CARE_PROVIDER_SITE_OTHER): Payer: PRIVATE HEALTH INSURANCE | Admitting: Family Medicine

## 2019-06-09 ENCOUNTER — Ambulatory Visit (INDEPENDENT_AMBULATORY_CARE_PROVIDER_SITE_OTHER): Payer: Self-pay

## 2019-06-09 ENCOUNTER — Other Ambulatory Visit: Payer: Self-pay

## 2019-06-09 DIAGNOSIS — S62647D Nondisplaced fracture of proximal phalanx of left little finger, subsequent encounter for fracture with routine healing: Secondary | ICD-10-CM | POA: Diagnosis not present

## 2019-06-09 NOTE — Progress Notes (Signed)
Office Visit Note   Patient: Ashley [name]           Date of Birth: Jan 07, 1966           MRN: [id number] Visit Date: 06/09/2019 Requested by: Janith Lima, MD [address],  [place] [address] PCP: Janith Lima, MD  Subjective: Chief Complaint  Patient presents with  . Left Little Finger - Follow-up, Fracture    DOI 05/12/2019 proximal phalanx fracture. In ulna gutter splint. Takes off once a day to work on ROM. "I can move the finger more now."    HPI: She is about a month status post fall resulting in left fifth finger proximal phalanx fracture.  Pain steadily improving.              ROS:   All other systems were reviewed and are negative.  Objective: Vital Signs: LMP 02/12/2014   Physical Exam:  General:  Alert and oriented, in no acute distress. Pulm:  Breathing unlabored. Psy:  Normal mood, congruent affect. Skin: No skin breakdown. Left hand: Slight tenderness still at the fifth finger proximal phalanx but much less than before.  Imaging: X-Rays left fifth finger: Abundant callus formation today with nearly anatomic alignment.  Assessment & Plan: 1.  Clinically healing 1 month status post fall resulting in left fifth finger proximal phalanx fracture -Start working on grip strength to tolerance.  Follow-up in 2 to 3 weeks mainly on an as-needed basis.  If still having pain we will repeat x-rays.     Procedures: No procedures performed  No notes on file     PMFS History: Patient Active Problem List   Diagnosis Date Noted  . Closed displaced fracture of proximal phalanx of left little finger 05/08/2019  . Hypotension 03/04/2019  . Abnormal chest x-ray with multiple lung nodules 03/04/2019  . Chest pain 02/17/2019  . Lumbar radiculitis 02/10/2019  . Gastroesophageal reflux disease with esophagitis 02/10/2019  . Essential hypertension 10/20/2018  . Chronic migraine without aura, with intractable migraine, so stated, with status  migrainosus 07/02/2018  . Thiamine deficiency 01/05/2018  . Claudication of both lower extremities ([place]) 12/30/2017  . Constipation 09/12/2017  . Moderate episode of recurrent major depressive disorder ([place]) 05/16/2017  . Diabetes mellitus with complication ([place])   . 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 Tobacco abuse 01/08/2017  . Chronic bronchitis ([place]) 10/20/2015  . Dyslipidemia, goal LDL below 70 04/12/2015  . Migraine with aura, intractable 08/12/2014  . Routine general medical examination at a health care facility 05/24/2011  . Anxiety state 09/30/2009  . INSOMNIA, CHRONIC 09/30/2009   Past Medical History:  Diagnosis Date  . Anxiety   . Bronchitis    uses inhaler if needed for bronchitis, lasted used -2014  . Cataract    bilateral  . Chronic headaches    migraines - in past, uses Phenergan for nausea   . Deficiency anemia 12/30/2017  . Diabetes mellitus without complication ([place])    taken off Metformin since 04/2014, HgbA1C - normal, will follow up with PCP- Dr. Deborra Medina, 07/2014  . GERD (gastroesophageal reflux disease)   . H/O exercise stress test 2011   done at [facility]- told that it was WNL, done due to pt. having panic attacks   . History of blood transfusion 1966-07-30   at birth in [place], [place], unsure number of units  . Poor dentition    very poor oral health   . SVD (spontaneous vaginal delivery)  x 3  . Tobacco abuse     Family History  Problem Relation Age of Onset  . Diabetes Mother   . Hyperlipidemia Mother   . Hypertension Mother   . Anxiety disorder Mother   . Depression Mother   . Drug abuse Mother   . Prostate cancer Father   . Alcohol abuse Father   . Lung cancer Maternal Grandfather        smoked  . Emphysema Maternal Grandfather        smoked  . COPD Maternal Grandmother        never smoked   . Dementia Maternal Grandmother   . Thyroid cancer Paternal Aunt   . Colon cancer Neg Hx   . Rectal cancer Neg Hx   . Stomach cancer Neg Hx   .  Migraines Neg Hx     Past Surgical History:  Procedure Laterality Date  . ABDOMINAL HYSTERECTOMY    . APPENDECTOMY  1988  . CARDIAC CATHETERIZATION N/A 08/17/2016   Procedure: Left Heart Cath and Coronary Angiography;  Surgeon: Burnell Blanks, MD;  Location: [facility];  Service: Cardiovascular;  Laterality: N/A;  . CHOLECYSTECTOMY N/A 06/07/2014   Procedure: LAPAROSCOPIC CHOLECYSTECTOMY;  Surgeon: Ralene Ok, MD;  Location: [facility];  Service: General;  Laterality: N/A;  . CHOLECYSTECTOMY  June 07 2014  . COLONOSCOPY  02/16/2014   normal   . ESOPHAGOGASTRODUODENOSCOPY  12/28/2013  . KNEE ARTHROSCOPY  1995   left  . LAPAROSCOPIC ASSISTED VAGINAL HYSTERECTOMY N/A 04/15/2014   Procedure: LAPAROSCOPIC ASSISTED VAGINAL HYSTERECTOMY;  Surgeon: Marylynn Pearson, MD;  Location: [facility];  Service: Gynecology;  Laterality: N/A;  . LAPAROSCOPIC BILATERAL SALPINGO OOPHERECTOMY Bilateral 04/15/2014   Procedure: LAPAROSCOPIC BILATERAL SALPINGO OOPHORECTOMY;  Surgeon: Marylynn Pearson, MD;  Location: [facility];  Service: Gynecology;  Laterality: Bilateral;  . TUBAL LIGATION     Social History   Occupational History  . Occupation:      Comment: helps run a friends business  Tobacco Use  . Smoking status: Current Every Day Smoker    Packs/day: 2.00    Years: 35.00    Pack years: 70.00    Types: Cigarettes  . Smokeless tobacco: Never Used  . Tobacco comment: 08-17-17- pt reports smokes "almost 2 packs a day"  Substance and Sexual Activity  . Alcohol use: No    Alcohol/week: 0.0 standard drinks  . Drug use: No  . Sexual activity: Yes    Partners: Male    Birth control/protection: Surgical

## 2019-06-23 ENCOUNTER — Other Ambulatory Visit: Payer: Self-pay | Admitting: Internal Medicine

## 2019-06-23 DIAGNOSIS — F411 Generalized anxiety disorder: Secondary | ICD-10-CM

## 2019-06-23 MED ORDER — CLONAZEPAM 1 MG PO TABS: 1.0000 mg | ORAL_TABLET | Freq: Two times a day (BID) | ORAL | 3 refills | Status: DC

## 2019-06-29 ENCOUNTER — Other Ambulatory Visit: Payer: Self-pay

## 2019-06-29 ENCOUNTER — Encounter: Payer: Self-pay | Admitting: Neurology

## 2019-06-29 ENCOUNTER — Ambulatory Visit (INDEPENDENT_AMBULATORY_CARE_PROVIDER_SITE_OTHER): Payer: PRIVATE HEALTH INSURANCE | Admitting: Neurology

## 2019-06-29 VITALS — BP 119/75 | HR 86 | Temp 97.3°F | Ht 62.0 in | Wt 171.0 lb

## 2019-06-29 DIAGNOSIS — G1221 Amyotrophic lateral sclerosis: Secondary | ICD-10-CM | POA: Diagnosis not present

## 2019-06-29 DIAGNOSIS — I776 Arteritis, unspecified: Secondary | ICD-10-CM

## 2019-06-29 DIAGNOSIS — G43711 Chronic migraine without aura, intractable, with status migrainosus: Secondary | ICD-10-CM | POA: Diagnosis not present

## 2019-06-29 DIAGNOSIS — R270 Ataxia, unspecified: Secondary | ICD-10-CM | POA: Diagnosis not present

## 2019-06-29 DIAGNOSIS — G3281 Cerebellar ataxia in diseases classified elsewhere: Secondary | ICD-10-CM

## 2019-06-29 DIAGNOSIS — W19XXXD Unspecified fall, subsequent encounter: Secondary | ICD-10-CM

## 2019-06-29 DIAGNOSIS — R269 Unspecified abnormalities of gait and mobility: Secondary | ICD-10-CM | POA: Diagnosis not present

## 2019-06-29 DIAGNOSIS — R292 Abnormal reflex: Secondary | ICD-10-CM

## 2019-06-29 DIAGNOSIS — G1229 Other motor neuron disease: Secondary | ICD-10-CM

## 2019-06-29 MED ORDER — AJOVY 225 MG/1.5ML ~~LOC~~ SOSY: 225.0000 mg | PREFILLED_SYRINGE | SUBCUTANEOUS | 0 refills | Status: DC

## 2019-06-29 NOTE — Patient Instructions (Signed)
CT Angiogram of the blood vessels of the head and neck MRI cervical spine for falls Slowly decrease the Amitriptyline by 1/2 pill(25mg ) each week until off Restart Botox [name] once monthly  Fremanezumab injection What is this medicine? FREMANEZUMAB (fre ma NEZ ue mab) is used to prevent migraine headaches. This medicine may be used for other purposes; ask your health care provider or pharmacist if you have questions. COMMON BRAND NAME(S): [name] What should I tell my health care provider before I take this medicine? They need to know if you have any of these conditions:  an unusual or allergic reaction to fremanezumab, other medicines, foods, dyes, or preservatives  pregnant or trying to get pregnant  breast-feeding How should I use this medicine? This medicine is for injection under the skin. You will be taught how to prepare and give this medicine. Use exactly as directed. Take your medicine at regular intervals. Do not take your medicine more often than directed. It is important that you put your used needles and syringes in a special sharps container. Do not put them in a trash can. If you do not have a sharps container, call your pharmacist or healthcare provider to get one. Talk to your pediatrician regarding the use of this medicine in children. Special care may be needed. Overdosage: If you think you have taken too much of this medicine contact a poison control center or emergency room at once. NOTE: This medicine is only for you. Do not share this medicine with others. What if I miss a dose? If you miss a dose, take it as soon as you can. If it is almost time for your next dose, take only that dose. Do not take double or extra doses. What may interact with this medicine? Interactions are not expected. This list may not describe all possible interactions. Give your health care provider a list of all the medicines, herbs, non-prescription drugs, or dietary supplements you use. Also  tell them if you smoke, drink alcohol, or use illegal drugs. Some items may interact with your medicine. What should I watch for while using this medicine? Tell your doctor or healthcare professional if your symptoms do not start to get better or if they get worse. What side effects may I notice from receiving this medicine? Side effects that you should report to your doctor or health care professional as soon as possible:  allergic reactions like skin rash, itching or hives, swelling of the face, lips, or tongue Side effects that usually do not require medical attention (report these to your doctor or health care professional if they continue or are bothersome):  pain, redness, or irritation at site where injected This list may not describe all possible side effects. Call your doctor for medical advice about side effects. You may report side effects to FDA at 1-800-FDA-1088. Where should I keep my medicine? Keep out of the reach of children. You will be instructed on how to store this medicine. Throw away any unused medicine after the expiration date on the label. NOTE: This sheet is a summary. It may not cover all possible information. If you have questions about this medicine, talk to your doctor, pharmacist, or health care provider.  2020 Elsevier/Gold Standard (2017-09-09 17:22:56)

## 2019-06-29 NOTE — Progress Notes (Signed)
[facility]    Provider:  Dr Jaynee Eagles Referring Provider: Janith Lima, MD Primary Care Physician:  Janith Lima, MD   CC:  Migraine  Interval history June 29, 2019: Patient has been coming to neurology clinic for many years mostly for her migraine disorder but also for multiple neurologic and transient issues thought to be at least a component of conversion disorder.  She has done well as far as her migraines are concerned on Botox which has been delayed due to coronavirus and appears that last injection was over 6 months ago.  Patient did exceptionally well on Botox with greater than 50% decrease in severity and frequency.  However patient has not had Botox for over 6 months, chronic migraines have returned, she has daily headaches, unilateral, pounding, throbbing, nausea, vomiting, can last 24 to 72 hours, no medication overuse, no aura, movement makes it worse, daily headaches with 15 migraine days a month for 6 months since stopping Botox.  At this point we really need to restart Botox.  Patient is NOT on CGRP at this time.  Due to complaints of persistent memory loss which I do believe again mostly due to life stressors, component of conversion disorder, multiple medical disorders and mood disorders and sedentary lifestyle, she was sent for formal memory testing.  Not consistent with patterns typically identified with degenerative cortical dementia such as Alzheimer's.  Also noted a history of anxiety.  It was recommended that she have repeat testing if needed.  Dr. Sima Matas also stated history of migraines and possible thiamine deficiency, discussed f/u ith pcp and continue supplement 100mg  day.  Also question was possible vascular issues, we will order CT Angio of the head and neck for patient due to this however MRIs of the brain have been normal multiple times in the past and it is doubtful that patient has any vascular pathology but cannot rule this out especially  given results of testing and need to evaluate.  She continues to fall, she has pain and lightning bolt from the neck to the back possible llheumitte's sign, happens when she is washing dishes with neck flexion. She continues to fall, feels imbalanced, difficulty walking, falls frequently every 1-2 weeks recently fractured finger.  MRI of the lumbar spine in the past has been essentially normal.  She never has had an MRI of the cervical spine and we will order that, she does feel ataxic in the arms and the legs without any localizing symptoms to the thoracic spine we will order MRI of the cervical spine.   Interval history: 53 y.o. female here as a referral from Dr. Deborra Medina for new onset tremor. Past medical history of intractable migraine, prediabetes, depression, insomnia, anxiety, back pain, significant psychiatric stressors.. She has significant depression and anxiety and I have recommended psychiatric follow up in the past. She has been seen at [facility] for unilateral symptoms in the past by our stroke team and exam was consistent with inorganic causes possibly stress. My exams have also not been onsistent with organic cause, showing poor effort and give way on the right, splitting exactly midline with vibration and pinprick.   She has significant life stressors. Today she appears with acute onset tremor and weakness. Per Dr. Lorelei Pont, Over the course of the last few months she is seen multiple physicians including going to the hospital, going to the ER multiple times, and been admitted with a cardiac catheterization(normal).  She has gone from walking unassisted without any assistive devices to  now walking with a walker with great difficulty and having significant difficulties with even basic strength in her extremities.   She has been having more weakness. Legs feel like they are rubber and weigh 100 pounds. Weak in the legs. The tremors are not there all the time. Tremors are when watching TV. And when  smoking a cigarette she drops it. She says she has a tremor, no tremor today at rest, it is distractable. She has been to multiple doctors including tcardiac and pulmonology and nothing is wrong. Started in early august and getting worse. She has numbness and tingling in the feet which is new. Weakness in the legs and the arms. The right side is still worse. No diplopia, no facial weakness. Headaches are better.    Initial HPI 05/10/2016:  LEEA RAMBEAU is a 53 y.o. female here as a referral from Dr. Deborra Medina for once visit. Past medical history of intractable migraine, prediabetes, depression, insomnia, anxiety, back pain, significant psychiatric stressors. She was diagnosed with cluster headaches by Dr. Delice Lesch and is being treated with vearapamil, she also has migraines.  She sees Dr. Delice Lesch and she will be going back, Dr. Delice Lesch is on maternity leave at this time. She is here for evaluation of TIA over she was seen in the emergency room. Exam was consistent with nonorganic features in the right upper and lower extremities with splitting of vibration at the forehead. MRI of the brain was negative and she was sent home. Patient is here with her husband today who provides information as well. Patient says she ate a biscuit and then she started feelig weird and "numby and heavy" and the whole right side started feeling tingling. She still has the symptoms. She was seen in the emergency room, she was given shots. She was having headaches before this happened. No head trauma, symptoms started on her couch about 11am acute onset. She has continued tingly feelings on the right side, she says the symptoms are exactly just on the right side of her body, she says if you drqw a line directly down the middle of her body fron the face to the chest abdomen and pelvis exactly down the middle the whole right side doesn't feel right. She has a daily continuous headache since this episode. She had started having a very bad headache  before the right-sided symptoms. She is getting confused. She says she is having memory problems as well. Daily continuous headaches for years,  +phono/photophobiua, +nausea.. The headaches on the right side, throbbing and stabbing, she has light sensitivity, sounds sensitivity, nausea, vomiting. She has to sit in a dark room. It is continuous. She has 30 headaches out of month continuous, 20 or more are migrainous, can be as bad as a 10/10 and on average it is 6/10. No aura. No medication overuse. Denies lacrimation, injection, other signs or symptoms of cluster at this time. The right-sided symptoms occur with the migraines.   Reviewed notes, labs and imaging from outside physicians, which showed:   Reviewed notes from patient's emergency room visit that she is here to evaluate for: She was assessed by Dr. Leonie Man personally in the emergency room. Motor system exam revealed poor effort variable drift with nonorganic features in the right upper and lower extremities. Patient split midline with vibration. NH stroke scale was 3. TPA was not given. MRI of the brain was negative for acute event as was CT of the head. She was given Toradol IV and Reglan to treat her  headaches. She was sent home and not admitted.  Patient also been evaluated by Dr. Delice Lesch at [facility] neurology in 2016 for worsening headaches with memory changes and paresthesias. 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  Medications tried and failed: Verapamil, amitriptyline, Topamax, reglan, zofran, Relpax, Imitrex, a course of steroids, could not obtain triptan nasal spray due to insurance issues.   MRI of the brain 04/27/2016 (personally reviewed imaging and agree with the following): No acute infarct or intracranial hemorrhage.  No intracranial mass lesion  noted on this unenhanced exam.  No age advanced atrophy hydrocephalus.  Nonspecific posterior superior left calvarial lesion with fat intensity may represent small hemangioma without significant change from 2011.  Cervical medullary junction, pituitary region, pineal region and orbital structures unremarkable.  Major intracranial vascular structures are patent.  Partial opacification right mastoid air cells once again noted without obstructing lesion of the eustachian tube noted. Minimal mucosal thickening ethmoid sinus air cells and right sphenoid sinus.  IMPRESSION: No acute infarct.  Persistent mild opacification right mastoid air cells and minimal paranasal sinus mucosal thickening.  Urine rapid drug screen was negative, CBC showed elevated white blood cells 11.1, CMP was normal except for elevated glucose at 155.   Review of Systems: Patient complains of symptoms per HPI as well as the following symptoms: Fatigue, blurred vision, ringing in ears, joint pain, confusion, headache, numbness, weakness, dizziness, insomnia, anxiety and decreased energy. Pertinent negatives per HPI. All others negative.   Social History   Socioeconomic History   Marital status: Married    Spouse name: Alvester Chou   Number of children: 6   Years of education: 12   Highest education level: Not on file  Occupational History   Occupation:      Comment: helps run a friends business  Scientist, product/process development strain: Not on file   Food insecurity    Worry: Not on file    Inability: Not on file   Transportation needs    Medical: Not on file    Non-medical: Not on file  Tobacco Use   Smoking status: Current Every Day Smoker    Packs/day: 2.00    Years: 35.00    Pack years: 70.00    Types: Cigarettes   Smokeless tobacco: Never Used   Tobacco comment: 08-17-17- pt reports smokes "almost 2 packs a day"  Substance and Sexual Activity   Alcohol use: No    Alcohol/week:  0.0 standard drinks   Drug use: No   Sexual activity: Yes    Partners: Male    Birth control/protection: Surgical  Lifestyle   Physical activity    Days per week: Not on file    Minutes per session: Not on file   Stress: Not on file  Relationships   Social connections    Talks on phone: Not on file    Gets together: Not on file    Attends religious service: Not on file    Active member of club or organization: Not on file    Attends meetings of clubs or organizations: Not on file    Relationship status: Not on file   Intimate partner violence    Fear of current or ex partner: Not on file    Emotionally abused: Not on file    Physically abused: Not  on file    Forced sexual activity: Not on file  Other Topics Concern   Not on file  Social History Narrative   5 living children, one child is a crack cocaine addict who often breaks into their house to steal money. One child died of cerebral palsy.   Caffeine use: Dr Malachi Bonds (3 per day)   2 cups coffee per day    Family History  Problem Relation Age of Onset   Diabetes Mother    Hyperlipidemia Mother    Hypertension Mother    Anxiety disorder Mother    Depression Mother    Drug abuse Mother    Prostate cancer Father    Alcohol abuse Father    Lung cancer Maternal Grandfather        smoked   Emphysema Maternal Grandfather        smoked   COPD Maternal Grandmother        never smoked    Dementia Maternal Grandmother    Thyroid cancer Paternal Aunt    Colon cancer Neg Hx    Rectal cancer Neg Hx    Stomach cancer Neg Hx    Migraines Neg Hx     Past Medical History:  Diagnosis Date   Anxiety    Bronchitis    uses inhaler if needed for bronchitis, lasted used -2014   Cataract    bilateral   Chronic headaches    migraines - in past, uses Phenergan for nausea    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 Deborra Medina, 07/2014   GERD (gastroesophageal reflux disease)    H/O exercise stress test 2011   done at [facility]- told that it was WNL, done due to pt. having panic attacks    History of blood transfusion Nov 18, 1966   at birth in [place], [place], unsure number of units   Poor dentition    very poor oral health    SVD (spontaneous vaginal delivery)    x 3   Tobacco abuse     Past Surgical History:  Procedure Laterality Date   [place] N/A 08/17/2016   Procedure: Left Heart Cath and Coronary Angiography;  Surgeon: Burnell Blanks, MD;  Location: [facility];  Service: Cardiovascular;  Laterality: N/A;   CHOLECYSTECTOMY N/A 06/07/2014   Procedure: LAPAROSCOPIC CHOLECYSTECTOMY;  Surgeon: Ralene Ok, MD;  Location: [facility];  Service: General;  Laterality: N/A;   CHOLECYSTECTOMY  June 07 2014   COLONOSCOPY  02/16/2014   normal    ESOPHAGOGASTRODUODENOSCOPY  12/28/2013   KNEE ARTHROSCOPY  1995   left   LAPAROSCOPIC ASSISTED VAGINAL HYSTERECTOMY N/A 04/15/2014   Procedure: LAPAROSCOPIC ASSISTED VAGINAL HYSTERECTOMY;  Surgeon: Marylynn Pearson, MD;  Location: [facility];  Service: Gynecology;  Laterality: N/A;   LAPAROSCOPIC BILATERAL SALPINGO OOPHERECTOMY Bilateral 04/15/2014   Procedure: LAPAROSCOPIC BILATERAL SALPINGO OOPHORECTOMY;  Surgeon: Marylynn Pearson, MD;  Location: [facility];  Service: Gynecology;  Laterality: Bilateral;   TUBAL LIGATION      Current Outpatient Medications  Medication Sig Dispense Refill   albuterol (PROVENTIL HFA;VENTOLIN HFA) 108 (90 Base) MCG/ACT inhaler Inhale 2 puffs into the lungs every 6 (six) hours as needed for wheezing or shortness of breath.     AQUALANCE LANCETS 30G MISC   98   aspirin EC 81 MG tablet Take  1 tablet (81 mg total) by mouth daily. (Patient taking differently: Take 81 mg by mouth at bedtime. ) 90 tablet 1   BOTOX 200  units SOLR INJECT 155 UNITS IN THE MUSCLE INTO HEAD AND NECK MUSCLES EVERY 3 MONTHS BY PROVIDER 1 each 0   cholecalciferol (VITAMIN D3) 25 MCG (1000 UT) tablet Take 2,000 Units by mouth daily.     clonazePAM (KLONOPIN) 1 MG tablet Take 1 tablet (1 mg total) by mouth 2 (two) times daily. 60 tablet 3   dicyclomine (BENTYL) 10 MG capsule Take 1 capsule (10 mg total) by mouth 2 (two) times daily as needed (Cramping and abdominal pain). (Patient taking differently: Take 10 mg by mouth 2 (two) times a day. ) 180 capsule 1   docusate sodium (COLACE) 100 MG capsule Take 100 mg by mouth at bedtime.     etodolac (LODINE) 400 MG tablet Take 1 tablet (400 mg total) by mouth 2 (two) times daily as needed. (Patient taking differently: Take 400 mg by mouth 2 (two) times daily as needed (for back pain). ) 60 tablet 3   fludrocortisone (FLORINEF) 0.1 MG tablet Take 1 tablet (0.1 mg total) by mouth daily. 90 tablet 0   insulin detemir (LEVEMIR) 100 UNIT/ML injection Inject 0.3 mLs (30 Units total) into the skin daily. (Patient taking differently: Inject 35 Units into the skin at bedtime. ) 10 mL 3   Insulin Pen Needle 32G X 6 MM MISC Use daily as directed 100 each 1   Lancet Devices (ADJUSTABLE LANCING DEVICE) MISC Up to tid testing  98   metFORMIN (GLUCOPHAGE-XR) 750 MG 24 hr tablet Take 2 tablets (1,500 mg total) by mouth daily with breakfast. 180 tablet 1   pantoprazole (PROTONIX) 40 MG tablet Take 1 tablet (40 mg total) by mouth daily. 90 tablet 1   promethazine (PHENERGAN) 25 MG tablet Take 1 tablet (25 mg total) by mouth every 6 (six) hours as needed for nausea or vomiting. 30 tablet 1   rizatriptan (MAXALT-MLT) 10 MG disintegrating tablet Take 1 tablet (10 mg total) by mouth as needed for migraine. May repeat in 2 hours if needed. Do not take more than 3 tablets in a week. (Patient taking differently: Take 10 mg by mouth as needed for migraine (and may repeat once in 2 hours, if no relief (MAX OF 3  TABLETS/WK)). ) 10 tablet 3   rosuvastatin (CRESTOR) 20 MG tablet Take 1 tablet (20 mg total) by mouth daily. 90 tablet 1   traZODone (DESYREL) 150 MG tablet Take 1 tablet (150 mg total) by mouth at bedtime. 90 tablet 1   venlafaxine (EFFEXOR) 37.5 MG tablet TAKE 1 TABLET BY MOUTH TWICE DAILY (Patient taking differently: Take 37.5 mg by mouth 2 (two) times daily. ) 180 tablet 1   BAYER CONTOUR TEST test strip Testing for up to tid 100 each 98   nitroGLYCERIN (NITROSTAT) 0.4 MG SL tablet Place 1 tablet (0.4 mg total) under the tongue every 5 (five) minutes x 3 doses as needed for chest pain. 30 tablet 5   No current facility-administered medications for this visit.     Allergies as of 06/29/2019 - Review Complete 06/29/2019  Allergen Reaction Noted   Penicillins Anaphylaxis and Swelling    Topamax [topiramate] Other (See Comments) 02/12/2014    Vitals: BP 119/75 (BP Location: Right Arm, Patient Position: Sitting)    Pulse 86    Temp (!) 97.3 F (36.3 C) Comment: taken by front staff  Ht 5\' 2"  (1.575 m)    Wt 171 lb (77.6 kg)    LMP 02/12/2014    BMI 31.28 kg/m  Last Weight:  Wt Readings from Last 1 Encounters:  06/29/19 171 lb (77.6 kg)   Last Height:   Ht Readings from Last 1 Encounters:  06/29/19 5\' 2"  (1.575 m)       Physical exam: Exam: Gen: NAD                   CV: RRR, no MRG. No Carotid Bruits. No peripheral edema, warm, nontender Eyes: Conjunctivae clear without exudates or hemorrhage  Neuro: Detailed Neurologic Exam  Speech:    Speech is normal; fluent and spontaneous with normal comprehension.  Cognition:    The patient is oriented to person, place, and time;     Cranial Nerves:    The pupils are equal, round, and reactive to light.  Visual fields are full to finger confrontation. Extraocular movements are intact. Trigeminal sensation is intact and the muscles of mastication are normal. Left lid ptosis(chronic), otherwise the  face is symmetric, not  fatiguable. The palate elevates in the midline. Hearing intact. Voice is normal. Shoulder shrug is normal. The tongue has normal motion without fasciculations.   Coordination:    Normal finger to nose and heel to shin.  Gait:   Patient has a slightly stooped posture and antalgic gait, slightly wide-based.  Cannot tandem due to imbalance.  Motor Observation:    No asymmetry, no atrophy, and no involuntary movements noted. No tremor on exam.  Tone:    Normal muscle tone.    Posture:    Posture is slightly stooped    Strength: giveway and  poor effort but overall appears to have 4/5 strength if you encourage her and ask her to give all her effort her strength improves.  However cannot rule out proximal weakness in this patient.     Sensation: splits midline to pin prick and vibration     Reflex Exam:  DTR's:    Deep tendon reflexes in the upper and lower extremities are brisk bilaterally.   Toes:    The toes are equivocal bilaterally.   Clonus:    Clonus is absent.       Assessment/Plan:  53 y.o. female here as a follow up.  She has chronic migraines and was doing well on Botox we will need to reinitiate, Past medical history of intractable migraine, prediabetes, depression, insomnia, anxiety, back pain, significant psychiatric stressors.. She has significant depression and anxiety and I have recommended psychiatric follow up in the past. She has been seen at [facility] for unilateral symptoms in the past by our stroke team and exam was consistent with nonorganic causes possibly stress. My exams have also not been consistent with non-organic cause, showing poor effort and give way on the right, splitting exactly midline with vibration and pinprick, appears to have significant psychiatric contribution to her symptoms. with a lot of embellishment.  - She has done well as far as her migraines are concerned on Botox which has been delayed due to coronavirus and appears that last  injection was over 6 months ago and migraines chronic again, will restart botox.  - Due to complaints of persistent memory loss which I do believe again partly due to life stressors, component of conversion disorder, multiple medical disorders and mood disorders and sedentary lifestyle, she was sent for formal memory testing.  Not consistent with patterns typically identified is seen with  degenerative cortical dementia such as Alzheimer's.  Also noted a history of anxiety.  However given questionable vascular etiology to her memory loss we need CTA of the head and neck.  We will also decrease Amitriptyline by 1/2 pill each week until stopped which could be causing considerable memory loss, she has been on it for years prescribed by Dr. Ronnald Ramp of asked her to let this physician know that we will be stopping it. - restart Botox as above -  she has been complaining years of ataxia and falls, work-up to date including imaging of the brain, lumbar spine EMG nerve conduction study have been negative.  Need MRI of the cervical spine. -Highly recommend to continue with therapy and psychiatry for depression and anxiety which I feel is significantly contributing to her symptoms. Referral placed at last appointment. -For her migraine: Continue Botox for multiple failed medications.  - C/o problems with memory for years and MRi brain normal in 2018, do not see any need to repeat - Other meds that can affect her are clonazepam, trazodone - Discuss with Dr. Ronnald Ramp - Can continue to Venlafaxine for migranes - hgba1c 7.8, better control of her chronic conditions may also help  - fall precautions  PRIOR:   New onset weakness and gait distrubance and tremor (no tremor on exam today): Feel this may be functional or conversion disorder.  When she gets up independently she shuffles but if you hold her hands lightly  and walk very quickly up and down the hallway she can keep up with good stride, good turn and can keep up  with a quick pace. Also no tremor on exam today.  Still need to rule out organic causes EMG/NCS, Labs today including CK, Myasthenia gravis antibodies.   A total of 40 minutes was spent face-to-face with this patient. Over half this time was spent on counseling patient on the  1. Chronic migraine without aura, with intractable migraine, so stated, with status migrainosus   2. 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 Fall, subsequent encounter   5. Gait abnormality   6. Arteritis ([place])   7. 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

## 2019-06-30 ENCOUNTER — Telehealth: Payer: Self-pay | Admitting: Neurology

## 2019-06-30 NOTE — Telephone Encounter (Signed)
[employer] pending faxed notes.

## 2019-07-06 NOTE — Telephone Encounter (Signed)
MR Cervical spine auth: [id number] (exp. 06/30/19 to 10/28/19) CT Angio Head auth: [id number] (exp. 06/30/19 to 10/28/19) CT Angio Neck Auth: [id number] (exp. 06/30/19 to 10/28/19)  [facility] for [facility]/19 arrival time is 3:45 PM. Patient is aware of time and day. She is also aware water only 4 hours prior to the exam.

## 2019-07-14 ENCOUNTER — Other Ambulatory Visit (INDEPENDENT_AMBULATORY_CARE_PROVIDER_SITE_OTHER): Payer: Self-pay | Admitting: Family Medicine

## 2019-07-15 ENCOUNTER — Other Ambulatory Visit: Payer: Self-pay

## 2019-07-15 ENCOUNTER — Telehealth: Payer: Self-pay | Admitting: Family Medicine

## 2019-07-15 NOTE — Telephone Encounter (Signed)
It looks like this is pending in your inbox when I tried to refill this. Please advise.

## 2019-07-15 NOTE — Telephone Encounter (Signed)
Received call from [facility] with [facility] requesting refill for Rx (Etodolac 400 mg) The number to contact Andee Poles is [phone]

## 2019-07-16 ENCOUNTER — Ambulatory Visit (HOSPITAL_COMMUNITY)
Admission: RE | Admit: 2019-07-16 | Discharge: 2019-07-16 | Disposition: A | Discharge status: Home / Self Care | Payer: PRIVATE HEALTH INSURANCE | Source: Ambulatory Visit | Attending: Neurology | Admitting: Neurology

## 2019-07-16 ENCOUNTER — Other Ambulatory Visit: Payer: Self-pay

## 2019-07-16 DIAGNOSIS — W19XXXD Unspecified fall, subsequent encounter: Secondary | ICD-10-CM

## 2019-07-16 DIAGNOSIS — G1221 Amyotrophic lateral sclerosis: Secondary | ICD-10-CM | POA: Insufficient documentation

## 2019-07-16 DIAGNOSIS — R270 Ataxia, unspecified: Secondary | ICD-10-CM | POA: Diagnosis not present

## 2019-07-16 DIAGNOSIS — I6521 Occlusion and stenosis of right carotid artery: Secondary | ICD-10-CM | POA: Diagnosis not present

## 2019-07-16 DIAGNOSIS — F17210 Nicotine dependence, cigarettes, uncomplicated: Secondary | ICD-10-CM | POA: Insufficient documentation

## 2019-07-16 DIAGNOSIS — G1229 Other motor neuron disease: Secondary | ICD-10-CM

## 2019-07-16 DIAGNOSIS — G3281 Cerebellar ataxia in diseases classified elsewhere: Secondary | ICD-10-CM | POA: Diagnosis not present

## 2019-07-16 DIAGNOSIS — M4802 Spinal stenosis, cervical region: Secondary | ICD-10-CM | POA: Insufficient documentation

## 2019-07-16 DIAGNOSIS — R292 Abnormal reflex: Secondary | ICD-10-CM | POA: Insufficient documentation

## 2019-07-16 DIAGNOSIS — R269 Unspecified abnormalities of gait and mobility: Secondary | ICD-10-CM

## 2019-07-16 DIAGNOSIS — I776 Arteritis, unspecified: Secondary | ICD-10-CM | POA: Diagnosis not present

## 2019-07-16 MED ORDER — IOHEXOL 350 MG/ML SOLN
100.0000 mL | Freq: Once | INTRAVENOUS | Status: AC | PRN
  Administered 2019-07-16: 100 mL via INTRAVENOUS

## 2019-07-19 NOTE — Telephone Encounter (Signed)
Rx sent 

## 2019-07-27 ENCOUNTER — Other Ambulatory Visit: Payer: Self-pay | Admitting: Internal Medicine

## 2019-07-27 ENCOUNTER — Ambulatory Visit (INDEPENDENT_AMBULATORY_CARE_PROVIDER_SITE_OTHER): Payer: PRIVATE HEALTH INSURANCE | Admitting: Adult Health

## 2019-07-27 ENCOUNTER — Other Ambulatory Visit: Payer: Self-pay

## 2019-07-27 ENCOUNTER — Encounter: Payer: Self-pay | Admitting: Adult Health

## 2019-07-27 VITALS — BP 142/62 | HR 65 | Ht 62.0 in | Wt 169.2 lb

## 2019-07-27 DIAGNOSIS — E7800 Pure hypercholesterolemia, unspecified: Secondary | ICD-10-CM

## 2019-07-27 DIAGNOSIS — I959 Hypotension, unspecified: Secondary | ICD-10-CM | POA: Diagnosis not present

## 2019-07-27 DIAGNOSIS — R0789 Other chest pain: Secondary | ICD-10-CM

## 2019-07-27 DIAGNOSIS — F411 Generalized anxiety disorder: Secondary | ICD-10-CM

## 2019-07-27 DIAGNOSIS — F418 Other specified anxiety disorders: Secondary | ICD-10-CM

## 2019-07-27 MED ORDER — FLUDROCORTISONE ACETATE 0.1 MG PO TABS: 0.1000 mg | ORAL_TABLET | Freq: Every day | ORAL | 0 refills | Status: DC

## 2019-07-27 MED ORDER — VENLAFAXINE HCL 37.5 MG PO TABS: 37.5000 mg | ORAL_TABLET | Freq: Two times a day (BID) | ORAL | 1 refills | Status: DC

## 2019-07-27 NOTE — Progress Notes (Signed)
Cardiology Office Note   Date:  07/27/2019   ID:  Ashley [name], DOB 11/08/1966, MRN [id number]  PCP:  Janith Lima, MD  Cardiologist: Dr.  Sallyanne Kuster  CC: Follow Up   History of Present Illness: Ashley [name] is a 53 y.o. female who presents for ongoing assessment and management of hypertension, with other history to include tobacco abuse, anxiety, Type II diabetes, and COPD.  Patient had left heart catheterization in 2017 which revealed no angiographic evidence of CAD, normal LV systolic function, normal LV filling pressures.  It was determined that the patient was having atypical chest pain, and on last office visit dated 03/09/2019 the patient was offered reassurance in light of cardiac catheterization in 2017 which did not reveal any angiographic evidence of coronary artery disease.  She was to follow-up with her pulmonologist for ongoing treatment of COPD.  Her blood pressure was well controlled, and no further testing was planned.  She is also followed by neurology for tremors, component of conversion disorder, and memory loss. She also has psychological disorders.  She comes today with multiple complaints from chronic neck pain, back pain, leg pain, dizziness, labile blood pressure, and worries about her carotid artery disease.  Fortunately, the patient is not having any chest pain.  Past Medical History:  Diagnosis Date  . Anxiety   . Bronchitis    uses inhaler if needed for bronchitis, lasted used -2014  . Cataract    bilateral  . Chronic headaches    migraines - in past, uses Phenergan for nausea   . Deficiency anemia 12/30/2017  . Diabetes mellitus without complication ([place])    taken off Metformin since 04/2014, HgbA1C - normal, will follow up with PCP- Dr. Deborra Medina, 07/2014  . GERD (gastroesophageal reflux disease)   . H/O exercise stress test 2011   done at [facility]- told that it was WNL, done due to pt. having panic attacks   . History of blood transfusion Dec 02, 1966   at birth in  [place], [place], unsure number of units  . Poor dentition    very poor oral health   . SVD (spontaneous vaginal delivery)    x 3  . Tobacco abuse     Past Surgical History:  Procedure Laterality Date  . ABDOMINAL HYSTERECTOMY    . APPENDECTOMY  1988  . CARDIAC CATHETERIZATION N/A 08/17/2016   Procedure: Left Heart Cath and Coronary Angiography;  Surgeon: Burnell Blanks, MD;  Location: [place] [facility];  Service: Cardiovascular;  Laterality: N/A;  . CHOLECYSTECTOMY N/A 06/07/2014   Procedure: LAPAROSCOPIC CHOLECYSTECTOMY;  Surgeon: Ralene Ok, MD;  Location: [place];  Service: General;  Laterality: N/A;  . CHOLECYSTECTOMY  June 07 2014  . COLONOSCOPY  02/16/2014   normal   . ESOPHAGOGASTRODUODENOSCOPY  12/28/2013  . KNEE ARTHROSCOPY  1995   left  . LAPAROSCOPIC ASSISTED VAGINAL HYSTERECTOMY N/A 04/15/2014   Procedure: LAPAROSCOPIC ASSISTED VAGINAL HYSTERECTOMY;  Surgeon: Marylynn Pearson, MD;  Location: [facility] [facility];  Service: Gynecology;  Laterality: N/A;  . LAPAROSCOPIC BILATERAL SALPINGO OOPHERECTOMY Bilateral 04/15/2014   Procedure: LAPAROSCOPIC BILATERAL SALPINGO OOPHORECTOMY;  Surgeon: Marylynn Pearson, MD;  Location: [facility];  Service: Gynecology;  Laterality: Bilateral;  . TUBAL LIGATION       Current Outpatient Medications  Medication Sig Dispense Refill  . albuterol (PROVENTIL HFA;VENTOLIN HFA) 108 (90 Base) MCG/ACT inhaler Inhale 2 puffs into the lungs every 6 (six) hours as needed for wheezing or shortness of breath.    Marland Kitchen AQUALANCE  LANCETS 30G MISC   98  . aspirin EC 81 MG tablet Take 1 tablet (81 mg total) by mouth daily. (Patient taking differently: Take 81 mg by mouth at bedtime. ) 90 tablet 1  . BAYER CONTOUR TEST test strip Testing for up to tid 100 each 98  . BOTOX 200 units SOLR INJECT 155 UNITS IN THE MUSCLE INTO HEAD AND NECK MUSCLES EVERY 3 MONTHS BY PROVIDER 1 each 0  . cholecalciferol (VITAMIN D3) 25 MCG (1000 UT) tablet Take 2,000 Units by mouth daily.    .  clonazePAM (KLONOPIN) 1 MG tablet Take 1 tablet (1 mg total) by mouth 2 (two) times daily. 60 tablet 3  . dicyclomine (BENTYL) 10 MG capsule Take 1 capsule (10 mg total) by mouth 2 (two) times daily as needed (Cramping and abdominal pain). (Patient taking differently: Take 10 mg by mouth 2 (two) times a day. ) 180 capsule 1  . docusate sodium (COLACE) 100 MG capsule Take 100 mg by mouth at bedtime.    Marland Kitchen etodolac (LODINE) 400 MG tablet Take 1 tablet (400 mg total) by mouth 2 (two) times daily as needed (for back pain). 60 tablet 5  . fludrocortisone (FLORINEF) 0.1 MG tablet Take 1 tablet (0.1 mg total) by mouth daily. 90 tablet 0  . insulin detemir (LEVEMIR) 100 UNIT/ML injection Inject 0.3 mLs (30 Units total) into the skin daily. (Patient taking differently: Inject 35 Units into the skin at bedtime. ) 10 mL 3  . Insulin Pen Needle 32G X 6 MM MISC Use daily as directed 100 each 1  . Lancet Devices (ADJUSTABLE LANCING DEVICE) MISC Up to tid testing  98  . metFORMIN (GLUCOPHAGE-XR) 750 MG 24 hr tablet Take 2 tablets (1,500 mg total) by mouth daily with breakfast. 180 tablet 1  . nitroGLYCERIN (NITROSTAT) 0.4 MG SL tablet Place 1 tablet (0.4 mg total) under the tongue every 5 (five) minutes x 3 doses as needed for chest pain. 30 tablet 5  . pantoprazole (PROTONIX) 40 MG tablet Take 1 tablet (40 mg total) by mouth daily. 90 tablet 1  . promethazine (PHENERGAN) 25 MG tablet Take 1 tablet (25 mg total) by mouth every 6 (six) hours as needed for nausea or vomiting. 30 tablet 1  . rizatriptan (MAXALT-MLT) 10 MG disintegrating tablet Take 1 tablet (10 mg total) by mouth as needed for migraine. May repeat in 2 hours if needed. Do not take more than 3 tablets in a week. (Patient taking differently: Take 10 mg by mouth as needed for migraine (and may repeat once in 2 hours, if no relief (MAX OF 3 TABLETS/WK)). ) 10 tablet 3  . rosuvastatin (CRESTOR) 20 MG tablet Take 1 tablet (20 mg total) by mouth daily. 90 tablet  1  . traZODone (DESYREL) 150 MG tablet Take 1 tablet (150 mg total) by mouth at bedtime. 90 tablet 1  . venlafaxine (EFFEXOR) 37.5 MG tablet Take 1 tablet (37.5 mg total) by mouth 2 (two) times daily with a meal. 180 tablet 1   No current facility-administered medications for this visit.     Allergies:   Penicillins and Topamax [topiramate]    Social History:  The patient  reports that she has been smoking cigarettes. She has a 70.00 pack-year smoking history. She has never used smokeless tobacco. She reports that she does not drink alcohol or use drugs.   Family History:  The patient's family history includes Alcohol abuse in her father; Anxiety disorder in her mother;  COPD in her maternal grandmother; Dementia in her maternal grandmother; Depression in her mother; Diabetes in her mother; Drug abuse in her mother; Emphysema in her maternal grandfather; Hyperlipidemia in her mother; Hypertension in her mother; Lung cancer in her maternal grandfather; Prostate cancer in her father; Thyroid cancer in her paternal aunt.    ROS: All other systems are reviewed and negative. Unless otherwise mentioned in H&P    PHYSICAL EXAM: VS:  BP (!) 142/62   Pulse 65   Ht 5\' 2"  (1.575 m)   Wt 169 lb 3.2 oz (76.7 kg)   LMP 02/12/2014   BMI 30.95 kg/m  , BMI Body mass index is 30.95 kg/m. GEN: Well nourished, well developed, in no acute distress HEENT: normal Neck: no JVD, carotid bruits, or masses Cardiac: RRR; no murmurs, rubs, or gallops,no edema  Respiratory:  Clear to auscultation bilaterally, normal work of breathing GI: soft, nontender, nondistended, + BS MS: no deformity or atrophy Skin: warm and dry, no rash Neuro:  Strength and sensation are intact Psych: euthymic mood, anxious about her health.   EKG: Normal sinus rhythm with nonspecific T wave flattening in V2 and aVL.  Recent Labs: 10/20/2018: Pro B Natriuretic peptide (BNP) 11.0 04/27/2019: ALT 16; BUN 11; Creatinine, Ser 1.02;  Hemoglobin 12.6; Platelets 309; Potassium 4.1; Sodium 138    Lipid Panel    Component Value Date/Time   CHOL 114 10/20/2018 1042   TRIG 103.0 10/20/2018 1042   HDL 39.70 10/20/2018 1042   CHOLHDL 3 10/20/2018 1042   VLDL 20.6 10/20/2018 1042   LDLCALC 54 10/20/2018 1042   LDLDIRECT 152.1 01/19/2013 0905      Wt Readings from Last 3 Encounters:  07/27/19 169 lb 3.2 oz (76.7 kg)  06/29/19 171 lb (77.6 kg)  05/08/19 175 lb (79.4 kg)      Other studies Reviewed: Cardiac Cath 08/17/2016  1. No angiographic evidence of CAD 2. Normal LV systolic function 3. Normal LV filling pressures  Echocardiogram 02/18/2019 1. The left ventricle has normal systolic function with an ejection fraction of 60-65%. The cavity size was normal. Left ventricular diastolic parameters were normal.  2. The right ventricle has normal systolic function. The cavity was normal. There is no increase in right ventricular wall thickness.  3. The mitral valve is normal in structure.  4. The tricuspid valve is normal in structure.  5. The aortic valve is tricuspid.  6. The pulmonic valve was normal in structure.  ASSESSMENT AND PLAN:  1.  Hypertension: She states that her blood pressure is labile.  She has been taken off of all of her antihypertensive medications but has been started on Florinef due to orthostatic hypotension.  Blood pressure is mildly elevated today but I will not make any additional changes as she appears to be very sensitive to her symptoms and I do not wish to cause further dizziness or hypotension away from the office.  She is due to follow-up with primary care physician tomorrow at which time labs and medications will be renewed.  We do not provide any of her current medications.  2.  Hypercholesterolemia: She remains on rosuvastatin 20 mg daily.  Follow-up labs by PCP.  3.  Chronic musculoskeletal pain: Should continue with neurology.  4.  Anxiety with nonspecific psychological disorders.   Reassurance is given concerning her carotid artery ultrasound.  She is to follow-up with psychiatry or neurology for ongoing management and medication adjustments.   Current medicines are reviewed at length with the  patient today.    Labs/ tests ordered today include: None. Phill Myron. [place] Pugh, ANP, AACC   07/27/2019 5:30 PM    [address] Office [phone] Fax [phone]

## 2019-07-27 NOTE — Patient Instructions (Signed)
Medication Instructions:  Continue current medications  If you need a refill on your cardiac medications before your next appointment, please call your pharmacy.  Labwork: None Ordered   Testing/Procedures: None Ordered  Follow-Up: You will need a follow up appointment in 1 Year.  Please call our office 2 months in advance to schedule this appointment.  You may see Dr Sallyanne Kuster or one of the following Advanced Practice Providers on your designated Care Team: Almyra Deforest, [place] . Fabian Sharp, PA-C     At [facility], you and your health needs are our priority.  As part of our continuing mission to provide you with exceptional heart care, we have created designated Provider Care Teams.  These Care Teams include your primary Cardiologist (physician) and Advanced Practice Providers (APPs -  Physician Assistants and Nurse Practitioners) who all work together to provide you with the care you need, when you need it.  Thank you for choosing [facility] at [facility]!!

## 2019-07-28 ENCOUNTER — Encounter: Payer: Self-pay | Admitting: Family

## 2019-07-28 ENCOUNTER — Other Ambulatory Visit (INDEPENDENT_AMBULATORY_CARE_PROVIDER_SITE_OTHER): Payer: PRIVATE HEALTH INSURANCE

## 2019-07-28 ENCOUNTER — Other Ambulatory Visit: Payer: Self-pay | Admitting: Family

## 2019-07-28 ENCOUNTER — Ambulatory Visit (INDEPENDENT_AMBULATORY_CARE_PROVIDER_SITE_OTHER)
Admission: RE | Admit: 2019-07-28 | Discharge: 2019-07-28 | Disposition: A | Discharge status: Home / Self Care | Payer: PRIVATE HEALTH INSURANCE | Source: Ambulatory Visit | Attending: Family | Admitting: Family

## 2019-07-28 ENCOUNTER — Ambulatory Visit (INDEPENDENT_AMBULATORY_CARE_PROVIDER_SITE_OTHER): Payer: PRIVATE HEALTH INSURANCE | Admitting: Family

## 2019-07-28 VITALS — BP 136/82 | HR 71 | Temp 98.1°F | Ht 62.0 in | Wt 169.8 lb

## 2019-07-28 DIAGNOSIS — I959 Hypotension, unspecified: Secondary | ICD-10-CM | POA: Diagnosis not present

## 2019-07-28 DIAGNOSIS — R0989 Other specified symptoms and signs involving the circulatory and respiratory systems: Secondary | ICD-10-CM

## 2019-07-28 DIAGNOSIS — R0789 Other chest pain: Secondary | ICD-10-CM | POA: Diagnosis not present

## 2019-07-28 DIAGNOSIS — N644 Mastodynia: Secondary | ICD-10-CM | POA: Diagnosis not present

## 2019-07-28 DIAGNOSIS — E118 Type 2 diabetes mellitus with unspecified complications: Secondary | ICD-10-CM | POA: Diagnosis not present

## 2019-07-28 DIAGNOSIS — R600 Localized edema: Secondary | ICD-10-CM

## 2019-07-28 LAB — COMPREHENSIVE METABOLIC PANEL
ALT: 32 U/L (ref 0–35)
AST: 23 U/L (ref 0–37)
Albumin: 4.4 g/dL (ref 3.5–5.2)
Alkaline Phosphatase: 76 U/L (ref 39–117)
BUN: 8 mg/dL (ref 6–23)
CO2: 27 mEq/L (ref 19–32)
Calcium: 9.5 mg/dL (ref 8.4–10.5)
Chloride: 106 mEq/L (ref 96–112)
Creatinine, Ser: 0.6 mg/dL (ref 0.40–1.20)
GFR: 104.36 mL/min (ref >60.00)
Glucose, Bld: 152 mg/dL — ABNORMAL HIGH (ref 70–99)
Potassium: 3.6 mEq/L (ref 3.5–5.1)
Sodium: 141 mEq/L (ref 135–145)
Total Bilirubin: 0.3 mg/dL (ref 0.2–1.2)
Total Protein: 7.1 g/dL (ref 6.0–8.3)

## 2019-07-28 LAB — CBC WITH DIFFERENTIAL/PLATELET
Basophils Absolute: 0.1 10*3/uL (ref 0.0–0.1)
Basophils Relative: 0.7 % (ref 0.0–3.0)
Eosinophils Absolute: 0.2 10*3/uL (ref 0.0–0.7)
Eosinophils Relative: 2.8 % (ref 0.0–5.0)
HCT: 39.6 % (ref 36.0–46.0)
Hemoglobin: 13.2 g/dL (ref 12.0–15.0)
Lymphocytes Relative: 33.6 % (ref 12.0–46.0)
Lymphs Abs: 2.8 10*3/uL (ref 0.7–4.0)
MCHC: 33.3 g/dL (ref 30.0–36.0)
MCV: 92.6 fl (ref 78.0–100.0)
Monocytes Absolute: 0.7 10*3/uL (ref 0.1–1.0)
Monocytes Relative: 8.1 % (ref 3.0–12.0)
Neutro Abs: 4.5 10*3/uL (ref 1.4–7.7)
Neutrophils Relative %: 54.8 % (ref 43.0–77.0)
Platelets: 445 10*3/uL — ABNORMAL HIGH (ref 150.0–400.0)
RBC: 4.27 Mil/uL (ref 3.87–5.11)
RDW: 14.4 % (ref 11.5–15.5)
WBC: 8.2 10*3/uL (ref 4.0–10.5)

## 2019-07-28 LAB — HEMOGLOBIN A1C: Hgb A1c MFr Bld: 6.8 % — ABNORMAL HIGH (ref 4.6–6.5)

## 2019-07-28 MED ORDER — TRULICITY 0.75 MG/0.5ML ~~LOC~~ SOAJ: SUBCUTANEOUS | 2 refills | Status: DC

## 2019-07-28 NOTE — Progress Notes (Signed)
Ashley [name] is a 53 y.o. female with the following history as recorded in EpicCare:  Patient Active Problem List   Diagnosis Date Noted  . Closed displaced fracture of proximal phalanx of left little finger 05/08/2019  . Hypotension 03/04/2019  . Abnormal chest x-ray with multiple lung nodules 03/04/2019  . Chest pain 02/17/2019  . Lumbar radiculitis 02/10/2019  . Gastroesophageal reflux disease with esophagitis 02/10/2019  . Essential hypertension 10/20/2018  . Chronic migraine without aura, with intractable migraine, so stated, with status migrainosus 07/02/2018  . Thiamine deficiency 01/05/2018  . Claudication of both lower extremities ([place]) 12/30/2017  . Constipation 09/12/2017  . Moderate episode of recurrent major depressive disorder ([place]) 05/16/2017  . Diabetes mellitus with complication ([place])   . 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 Tobacco abuse 01/08/2017  . Chronic bronchitis ([place]) 10/20/2015  . Dyslipidemia, goal LDL below 70 04/12/2015  . Migraine with aura, intractable 08/12/2014  . Routine general medical examination at a health care facility 05/24/2011  . Anxiety state 09/30/2009  . INSOMNIA, CHRONIC 09/30/2009    Current Outpatient Medications  Medication Sig Dispense Refill  . albuterol (PROVENTIL HFA;VENTOLIN HFA) 108 (90 Base) MCG/ACT inhaler Inhale 2 puffs into the lungs every 6 (six) hours as needed for wheezing or shortness of breath.    Marland Kitchen AQUALANCE LANCETS 30G MISC   98  . aspirin EC 81 MG tablet Take 1 tablet (81 mg total) by mouth daily. (Patient taking differently: Take 81 mg by mouth at bedtime. ) 90 tablet 1  . BAYER CONTOUR TEST test strip Testing for up to tid 100 each 98  . BOTOX 200 units SOLR INJECT 155 UNITS IN THE MUSCLE INTO HEAD AND NECK MUSCLES EVERY 3 MONTHS BY PROVIDER 1 each 0  . cholecalciferol (VITAMIN D3) 25 MCG (1000 UT) tablet Take 2,000 Units by mouth daily.    . clonazePAM (KLONOPIN) 1 MG tablet Take 1 tablet (1 mg total) by mouth 2  (two) times daily. 60 tablet 3  . dicyclomine (BENTYL) 10 MG capsule Take 1 capsule (10 mg total) by mouth 2 (two) times daily as needed (Cramping and abdominal pain). (Patient taking differently: Take 10 mg by mouth 2 (two) times a day. ) 180 capsule 1  . docusate sodium (COLACE) 100 MG capsule Take 100 mg by mouth at bedtime.    Marland Kitchen etodolac (LODINE) 400 MG tablet Take 1 tablet (400 mg total) by mouth 2 (two) times daily as needed (for back pain). 60 tablet 5  . fludrocortisone (FLORINEF) 0.1 MG tablet Take 1 tablet (0.1 mg total) by mouth daily. 90 tablet 0  . insulin detemir (LEVEMIR) 100 UNIT/ML injection Inject 0.3 mLs (30 Units total) into the skin daily. (Patient taking differently: Inject 35 Units into the skin at bedtime. ) 10 mL 3  . Insulin Pen Needle 32G X 6 MM MISC Use daily as directed 100 each 1  . Lancet Devices (ADJUSTABLE LANCING DEVICE) MISC Up to tid testing  98  . nitroGLYCERIN (NITROSTAT) 0.4 MG SL tablet Place 1 tablet (0.4 mg total) under the tongue every 5 (five) minutes x 3 doses as needed for chest pain. 30 tablet 5  . pantoprazole (PROTONIX) 40 MG tablet Take 1 tablet (40 mg total) by mouth daily. 90 tablet 1  . promethazine (PHENERGAN) 25 MG tablet Take 1 tablet (25 mg total) by mouth every 6 (six) hours as needed for nausea or vomiting. 30 tablet 1  . rizatriptan (MAXALT-MLT) 10 MG disintegrating tablet Take  1 tablet (10 mg total) by mouth as needed for migraine. May repeat in 2 hours if needed. Do not take more than 3 tablets in a week. (Patient taking differently: Take 10 mg by mouth as needed for migraine (and may repeat once in 2 hours, if no relief (MAX OF 3 TABLETS/WK)). ) 10 tablet 3  . rosuvastatin (CRESTOR) 20 MG tablet Take 1 tablet (20 mg total) by mouth daily. 90 tablet 1  . traZODone (DESYREL) 150 MG tablet Take 1 tablet (150 mg total) by mouth at bedtime. 90 tablet 1  . venlafaxine (EFFEXOR) 37.5 MG tablet Take 1 tablet (37.5 mg total) by mouth 2 (two) times  daily with a meal. 180 tablet 1  . Dulaglutide (TRULICITY) 2.84 XL/2.4MW SOPN Inject once weekly as directed 4 pen 2  . naproxen (NAPROSYN) 500 MG tablet      No current facility-administered medications for this visit.     Allergies: Penicillins, Topamax [topiramate], and Metformin and related  Past Medical History:  Diagnosis Date  . Anxiety   . Bronchitis    uses inhaler if needed for bronchitis, lasted used -2014  . Cataract    bilateral  . Chronic headaches    migraines - in past, uses Phenergan for nausea   . Deficiency anemia 12/30/2017  . Diabetes mellitus without complication ([place])    taken off Metformin since 04/2014, HgbA1C - normal, will follow up with PCP- Dr. Deborra Medina, 07/2014  . GERD (gastroesophageal reflux disease)   . H/O exercise stress test 2011   done at [facility]- told that it was WNL, done due to pt. having panic attacks   . History of blood transfusion Oct 16, 1966   at birth in [place], [place], unsure number of units  . Poor dentition    very poor oral health   . SVD (spontaneous vaginal delivery)    x 3  . Tobacco abuse     Past Surgical History:  Procedure Laterality Date  . ABDOMINAL HYSTERECTOMY    . APPENDECTOMY  1988  . CARDIAC CATHETERIZATION N/A 08/17/2016   Procedure: Left Heart Cath and Coronary Angiography;  Surgeon: Burnell Blanks, MD;  Location: [place] [facility];  Service: Cardiovascular;  Laterality: N/A;  . CHOLECYSTECTOMY N/A 06/07/2014   Procedure: LAPAROSCOPIC CHOLECYSTECTOMY;  Surgeon: Ralene Ok, MD;  Location: [place];  Service: General;  Laterality: N/A;  . CHOLECYSTECTOMY  June 07 2014  . COLONOSCOPY  02/16/2014   normal   . ESOPHAGOGASTRODUODENOSCOPY  12/28/2013  . KNEE ARTHROSCOPY  1995   left  . LAPAROSCOPIC ASSISTED VAGINAL HYSTERECTOMY N/A 04/15/2014   Procedure: LAPAROSCOPIC ASSISTED VAGINAL HYSTERECTOMY;  Surgeon: Marylynn Pearson, MD;  Location: [place] [facility] [facility];  Service: Gynecology;  Laterality: N/A;  . LAPAROSCOPIC BILATERAL  SALPINGO OOPHERECTOMY Bilateral 04/15/2014   Procedure: LAPAROSCOPIC BILATERAL SALPINGO OOPHORECTOMY;  Surgeon: Marylynn Pearson, MD;  Location: [facility];  Service: Gynecology;  Laterality: Bilateral;  . TUBAL LIGATION      Family History  Problem Relation Age of Onset  . Diabetes Mother   . Hyperlipidemia Mother   . Hypertension Mother   . Anxiety disorder Mother   . Depression Mother   . Drug abuse Mother   . Prostate cancer Father   . Alcohol abuse Father   . Lung cancer Maternal Grandfather        smoked  . Emphysema Maternal Grandfather        smoked  . COPD Maternal Grandmother        never smoked   .  Dementia Maternal Grandmother   . Thyroid cancer Paternal Aunt   . Colon cancer Neg Hx   . Rectal cancer Neg Hx   . Stomach cancer Neg Hx   . Migraines Neg Hx     Social History   Tobacco Use  . Smoking status: Current Every Day Smoker    Packs/day: 2.00    Years: 35.00    Pack years: 70.00    Types: Cigarettes  . Smokeless tobacco: Never Used  . Tobacco comment: 08-17-17- pt reports smokes "almost 2 packs a day"  Substance Use Topics  . Alcohol use: No    Alcohol/week: 0.0 standard drinks    Subjective:   Presents with numerous concerns:  1) Left breast pain x 1 week; feels like pain is shooting out from her breast; saw her cardiologist yesterday; Denies any chest pain, shortness of breath, blurred vision or headache. No rash or nipple discharge; is up to date on her mammogram; 2) Chronic diarrhea x 3 months- feels like it is related to Metformin- currently taking 1500 mg per day; has been off Metformin since Sunday and symptoms improved; 3) Would like to discuss alternatives to manage her diabetes- has never been able to get her sugars controlled on basal insulin alone; 4) Worried that her blood pressure is fluctuating and noticing increased swelling in her ankles; was recently taking off her blood pressure medications and started on Florinef due to concerns for  chronic hypotension; saw her cardiologist yesterday- no medication changes made then.    Objective:  Vitals:   07/28/19 1248  BP: 136/82  Pulse: 71  Temp: 98.1 F (36.7 C)  TempSrc: Oral  SpO2: 97%  Weight: 169 lb 12.8 oz (77 kg)  Height: _0  (1.575 m)    General: Well developed, well nourished, in no acute distress  Skin : Warm and dry.  Head: Normocephalic and atraumatic  Lungs: Respirations unlabored; clear to auscultation bilaterally without wheeze, rales, rhonchi  CVS exam: normal rate and regular rhythm.  Extremities: No edema, cyanosis, clubbing  Vessels: Symmetric bilaterally  Neurologic: Alert and oriented; speech intact; face symmetrical; moves all extremities well; CNII-XII intact without focal deficit   Assessment:  1. Breast pain, left   2. Atypical chest pain   3. Diabetes mellitus with complication ([place])   4. Hypotension, unspecified hypotension type   5. Labile hypertension   6. Pedal edema     Plan:  1. Update diagnostic mammogram left breast; 2. Check D-dimer, CBC, CMP, CXR today; needs to quit smoking. 3. D/C Metformin due to side effects; continue Levemir; will start Trulicity per patient request; 4. Continue Florinef; 5. & 6. I did discuss trial of SGLT2 for diabetes and swelling but patient wanted injectable; will reach out to her cardiologist to verify safety of diuretic prn with use of florinef;   Return in about 1 month (around 08/28/2019) for Dr. Ronnald Ramp.  Orders Placed This Encounter  Procedures  . MM Digital Diagnostic Unilat L    Standing Status:   Future    Standing Expiration Date:   09/26/2020    Order Specific Question:   Reason for Exam (SYMPTOM  OR DIAGNOSIS REQUIRED)    Answer:   left breast pain    Order Specific Question:   Is the patient pregnant?    Answer:   No    Order Specific Question:   Preferred imaging location?    Answer:   [facility]  . DG Chest 2 View  Standing Status:   Future    Number of Occurrences:   1     Standing Expiration Date:   09/26/2020    Order Specific Question:   Reason for Exam (SYMPTOM  OR DIAGNOSIS REQUIRED)    Answer:   atypical chest pain    Order Specific Question:   Is patient pregnant?    Answer:   No    Order Specific Question:   Preferred imaging location?    Answer:   [facility]    Order Specific Question:   Radiology Contrast Protocol - do NOT remove file path    Answer:   [email or web address]  . CBC w/Diff    Standing Status:   Future    Number of Occurrences:   1    Standing Expiration Date:   07/27/2020  . Comp Met (CMET)    Standing Status:   Future    Number of Occurrences:   1    Standing Expiration Date:   07/27/2020  . D-Dimer, Quantitative    Standing Status:   Future    Number of Occurrences:   1    Standing Expiration Date:   07/27/2020  . HgB A1c    Standing Status:   Future    Number of Occurrences:   1    Standing Expiration Date:   07/27/2020    Requested Prescriptions   Signed Prescriptions Disp Refills  . Dulaglutide (TRULICITY) 6.15 KS/8.4DB SOPN 4 pen 2    Sig: Inject once weekly as directed

## 2019-07-29 LAB — D-DIMER, QUANTITATIVE: D-Dimer, Quant: 0.41 mcg/mL FEU (ref <0.50)

## 2019-07-30 ENCOUNTER — Other Ambulatory Visit: Payer: PRIVATE HEALTH INSURANCE

## 2019-07-31 ENCOUNTER — Telehealth: Payer: Self-pay | Admitting: Internal Medicine

## 2019-07-31 NOTE — Telephone Encounter (Signed)
Her insurance will not cover Trulicity. It looks like it will cover Victoza but this is another daily injection. Another option is Vania Rea which is the one I mentioned that could help with her swelling.

## 2019-07-31 NOTE — Telephone Encounter (Signed)
Key: [id number]

## 2019-07-31 NOTE — Telephone Encounter (Signed)
[facility] called in PA request, but I see it has already been done.

## 2019-07-31 NOTE — Telephone Encounter (Signed)
Pt says the insurance is needing labs and something documenting that pt has already tried metformin.  She cant take metformin due to the side effects.   Please call pt for status update [phone]  Also are there any samples for pt to use in the mean time?

## 2019-07-31 NOTE — Telephone Encounter (Signed)
Relation to pt: self  Call back number: [phone]  Pharmacy: [facility] [address], [place] [address] AT [address] [phone] (Phone) [phone] (Fax)     Reason for call:  Patient states pharmacy contacted PCP office several times stating Dulaglutide (TRULICITY) 2.76 RW/1.1YY SOPN is need of a PA, please advise

## 2019-08-03 NOTE — Telephone Encounter (Signed)
Called and left message to return call to clinic to let me know which medication she preferred to try since Trulicity isn't covered by her insurance.  Also created CRM incase she calls back.

## 2019-08-04 ENCOUNTER — Other Ambulatory Visit: Payer: Self-pay | Admitting: Internal Medicine

## 2019-08-04 DIAGNOSIS — F411 Generalized anxiety disorder: Secondary | ICD-10-CM

## 2019-08-04 DIAGNOSIS — K2100 Gastro-esophageal reflux disease with esophagitis, without bleeding: Secondary | ICD-10-CM

## 2019-08-04 MED ORDER — PANTOPRAZOLE SODIUM 40 MG PO TBEC: 40.0000 mg | DELAYED_RELEASE_TABLET | Freq: Every day | ORAL | 1 refills | Status: DC

## 2019-08-04 MED ORDER — TRAZODONE HCL 150 MG PO TABS: 150.0000 mg | ORAL_TABLET | Freq: Every day | ORAL | 1 refills | Status: DC

## 2019-08-04 NOTE — Telephone Encounter (Signed)
Patient is asking that we appeal the PA denial.  States that she spoke with the insurance company and they stated the reason they denied the PA was bc she had not taken metformin for 3 months.  Patient states she did take metformin for 3 months and states it caused her major GI issues.    Patient states she would need something to carry her through until the appeal is processed.  Please follow up in regard.

## 2019-08-04 NOTE — Telephone Encounter (Signed)
Patient has been contacted and husband will come by to pick up samples for her.

## 2019-08-04 NOTE — Telephone Encounter (Signed)
She can come pick up Jardiance samples; take 10 mg daily; will see what happens with the request for Trulicity.

## 2019-08-11 ENCOUNTER — Ambulatory Visit: Payer: PRIVATE HEALTH INSURANCE | Admitting: Neurology

## 2019-08-17 ENCOUNTER — Telehealth: Payer: Self-pay | Admitting: Neurology

## 2019-08-17 NOTE — Telephone Encounter (Signed)
[facility] is pending patient consent. I tried reaching out to the patient but her phone is disconnected. DW

## 2019-08-18 NOTE — Progress Notes (Signed)
Interval history 08/19/2019 : Patient reports significant improvement of greater than 50% decrease migraine frequency and migraine severity. Her baseline was 30 headache days a month and 20 migraine days a month. Since Botox she has 10  migraines a month great response. Today the botox is wearing off and she has a migraine. Aimovig was denied by insurance. Try [name] for remaining migraines.     Consent Form Botulism Toxin Injection For Chronic Migraine  +All  Reviewed orally with patient, additionally signature is on file:  Botulism toxin has been approved by the Federal drug administration for treatment of chronic migraine. Botulism toxin does not cure chronic migraine and it may not be effective in some patients.  The administration of botulism toxin is accomplished by injecting a small amount of toxin into the muscles of the neck and head. Dosage must be titrated for each individual. Any benefits resulting from botulism toxin tend to wear off after 3 months with a repeat injection required if benefit is to be maintained. Injections are usually done every 3-4 months with maximum effect peak achieved by about 2 or 3 weeks. Botulism toxin is expensive and you should be sure of what costs you will incur resulting from the injection.  The side effects of botulism toxin use for chronic migraine may include:   -Transient, and usually mild, facial weakness with facial injections  -Transient, and usually mild, head or neck weakness with head/neck injections  -Reduction or loss of forehead facial animation due to forehead muscle weakness  -Eyelid drooping  -Dry eye  -Pain at the site of injection or bruising at the site of injection  -Double vision  -Potential unknown long term risks  Contraindications: You should not have Botox if you are pregnant, nursing, allergic to albumin, have an infection, skin condition, or muscle weakness at the site of the injection, or have myasthenia gravis,  Lambert-Eaton syndrome, or ALS.  It is also possible that as with any injection, there may be an allergic reaction or no effect from the medication. Reduced effectiveness after repeated injections is sometimes seen and rarely infection at the injection site may occur. All care will be taken to prevent these side effects. If therapy is given over a long time, atrophy and wasting in the muscle injected may occur. Occasionally the patient's become refractory to treatment because they develop antibodies to the toxin. In this event, therapy needs to be modified.  I have read the above information and consent to the administration of botulism toxin.    BOTOX PROCEDURE NOTE FOR MIGRAINE HEADACHE    Contraindications and precautions discussed with patient(above). Aseptic procedure was observed and patient tolerated procedure. Procedure performed by Dr. Georgia Dom  The condition has existed for more than 6 months, and pt does not have a diagnosis of ALS, Myasthenia Gravis or Lambert-Eaton Syndrome.  Risks and benefits of injections discussed and pt agrees to proceed with the procedure.  Written consent obtained  These injections are medically necessary. Pt  receives good benefits from these injections. These injections do not cause sedations or hallucinations which the oral therapies may cause.  Description of procedure:  The patient was placed in a sitting position. The standard protocol was used for Botox as follows, with 5 units of Botox injected at each site:   -Procerus muscle, midline injection  -Corrugator muscle, bilateral injection  -Frontalis muscle, bilateral injection, with 2 sites each side, medial injection was performed in the upper one third of the frontalis muscle, in the  region vertical from the medial inferior edge of the superior orbital rim. The lateral injection was again in the upper one third of the forehead vertically above the lateral limbus of the cornea, 1.5 cm lateral  to the medial injection site.  - Levator Scapulae: 5 units bilaterally  -Temporalis muscle injection, 5 sites, bilaterally. The first injection was 3 cm above the tragus of the ear, second injection site was 1.5 cm to 3 cm up from the first injection site in line with the tragus of the ear. The third injection site was 1.5-3 cm forward between the first 2 injection sites. The fourth injection site was 1.5 cm posterior to the second injection site. 5th site laterally in the temporalis  muscleat the level of the outer canthus.  - Patient feels her clenching is a trigger for headaches. +5 units masseter bilaterally   - Patient feels the migraines are centered around the eyes +5 units bilaterally at the outer canthus in the orbicularis occuli  -Occipitalis muscle injection, 3 sites, bilaterally. The first injection was done one half way between the occipital protuberance and the tip of the mastoid process behind the ear. The second injection site was done lateral and superior to the first, 1 fingerbreadth from the first injection. The third injection site was 1 fingerbreadth superiorly and medially from the first injection site.  -Cervical paraspinal muscle injection, 2 sites, bilateral knee first injection site was 1 cm from the midline of the cervical spine, 3 cm inferior to the lower border of the occipital protuberance. The second injection site was 1.5 cm superiorly and laterally to the first injection site.  -Trapezius muscle injection was performed at 3 sites, bilaterally. The first injection site was in the upper trapezius muscle halfway between the inflection point of the neck, and the acromion. The second injection site was one half way between the acromion and the first injection site. The third injection was done between the first injection site and the inflection point of the neck.   Will return for repeat injection in 3 months.   A 200 unit sof Botox was used, any Botox not injected was  wasted. The patient tolerated the procedure well, there were no complications of the above procedure.

## 2019-08-18 NOTE — Telephone Encounter (Signed)
I spoke with the patient this morning and made her aware and gave her the phone number to [facility]. DW

## 2019-08-19 ENCOUNTER — Other Ambulatory Visit: Payer: Self-pay

## 2019-08-19 ENCOUNTER — Ambulatory Visit (INDEPENDENT_AMBULATORY_CARE_PROVIDER_SITE_OTHER): Payer: PRIVATE HEALTH INSURANCE | Admitting: Neurology

## 2019-08-19 VITALS — Temp 98.9°F

## 2019-08-19 DIAGNOSIS — G43711 Chronic migraine without aura, intractable, with status migrainosus: Secondary | ICD-10-CM

## 2019-08-19 MED ORDER — AJOVY 225 MG/1.5ML ~~LOC~~ SOSY: 225.0000 mg | PREFILLED_SYRINGE | SUBCUTANEOUS | 11 refills | Status: DC

## 2019-08-19 MED ORDER — KETOROLAC TROMETHAMINE 60 MG/2ML IM SOLN
60.0000 mg | Freq: Once | INTRAMUSCULAR | Status: AC
  Administered 2019-08-19: 60 mg via INTRAMUSCULAR

## 2019-08-19 NOTE — Progress Notes (Addendum)
Botox- 200 units x 1 vial Lot: [id number] Expiration: 03/2022 NDC: [id number]  Bacteriostatic 0.9% Sodium Chloride- 16mL total Lot: [id number] Expiration: 09/24/2019 NDC: [id number]  Dx: [id number] S/P   Pt given Toradol 60 mg IM injection in RUOQ of R buttock. Pt tolerated injection well. Slight bleeding resolved quickly. Bandaid applied. See MAR.

## 2019-08-19 NOTE — Addendum Note (Signed)
Addended by: Gildardo Griffes on: 08/19/2019 12:23 PM   Modules accepted: Orders

## 2019-08-24 ENCOUNTER — Other Ambulatory Visit: Payer: Self-pay | Admitting: Internal Medicine

## 2019-08-24 DIAGNOSIS — E118 Type 2 diabetes mellitus with unspecified complications: Secondary | ICD-10-CM

## 2019-08-24 DIAGNOSIS — E1165 Type 2 diabetes mellitus with hyperglycemia: Secondary | ICD-10-CM

## 2019-08-24 MED ORDER — INSULIN DETEMIR 100 UNIT/ML ~~LOC~~ SOLN: 30.0000 [IU] | Freq: Every day | SUBCUTANEOUS | 3 refills | Status: DC

## 2019-08-27 ENCOUNTER — Ambulatory Visit: Payer: PRIVATE HEALTH INSURANCE | Admitting: Internal Medicine

## 2019-09-01 ENCOUNTER — Other Ambulatory Visit (INDEPENDENT_AMBULATORY_CARE_PROVIDER_SITE_OTHER): Payer: PRIVATE HEALTH INSURANCE

## 2019-09-01 ENCOUNTER — Encounter: Payer: Self-pay | Admitting: Internal Medicine

## 2019-09-01 ENCOUNTER — Other Ambulatory Visit: Payer: Self-pay

## 2019-09-01 ENCOUNTER — Ambulatory Visit (INDEPENDENT_AMBULATORY_CARE_PROVIDER_SITE_OTHER): Payer: PRIVATE HEALTH INSURANCE | Admitting: Internal Medicine

## 2019-09-01 VITALS — BP 134/76 | HR 88 | Temp 98.3°F | Resp 16 | Ht 60.0 in | Wt 164.0 lb

## 2019-09-01 DIAGNOSIS — D473 Essential (hemorrhagic) thrombocythemia: Secondary | ICD-10-CM

## 2019-09-01 DIAGNOSIS — E118 Type 2 diabetes mellitus with unspecified complications: Secondary | ICD-10-CM | POA: Diagnosis not present

## 2019-09-01 DIAGNOSIS — E785 Hyperlipidemia, unspecified: Secondary | ICD-10-CM

## 2019-09-01 DIAGNOSIS — D75839 Thrombocytosis, unspecified: Secondary | ICD-10-CM | POA: Insufficient documentation

## 2019-09-01 DIAGNOSIS — E519 Thiamine deficiency, unspecified: Secondary | ICD-10-CM

## 2019-09-01 DIAGNOSIS — Z23 Encounter for immunization: Secondary | ICD-10-CM | POA: Diagnosis not present

## 2019-09-01 DIAGNOSIS — E611 Iron deficiency: Secondary | ICD-10-CM

## 2019-09-01 LAB — CBC WITH DIFFERENTIAL/PLATELET
Basophils Absolute: 0.1 10*3/uL (ref 0.0–0.1)
Basophils Relative: 0.9 % (ref 0.0–3.0)
Eosinophils Absolute: 0.4 10*3/uL (ref 0.0–0.7)
Eosinophils Relative: 3.9 % (ref 0.0–5.0)
HCT: 41.5 % (ref 36.0–46.0)
Hemoglobin: 14.2 g/dL (ref 12.0–15.0)
Lymphocytes Relative: 29.4 % (ref 12.0–46.0)
Lymphs Abs: 2.9 10*3/uL (ref 0.7–4.0)
MCHC: 34.1 g/dL (ref 30.0–36.0)
MCV: 91 fl (ref 78.0–100.0)
Monocytes Absolute: 0.9 10*3/uL (ref 0.1–1.0)
Monocytes Relative: 8.7 % (ref 3.0–12.0)
Neutro Abs: 5.6 10*3/uL (ref 1.4–7.7)
Neutrophils Relative %: 57.1 % (ref 43.0–77.0)
Platelets: 396 10*3/uL (ref 150.0–400.0)
RBC: 4.56 Mil/uL (ref 3.87–5.11)
RDW: 14.9 % (ref 11.5–15.5)
WBC: 9.8 10*3/uL (ref 4.0–10.5)

## 2019-09-01 LAB — IBC PANEL
Iron: 35 ug/dL — ABNORMAL LOW (ref 42–145)
Saturation Ratios: 9.7 % — ABNORMAL LOW (ref 20.0–50.0)
Transferrin: 258 mg/dL (ref 212.0–360.0)

## 2019-09-01 LAB — LIPID PANEL
Cholesterol: 102 mg/dL (ref 0–200)
HDL: 43.7 mg/dL (ref >39.00)
LDL Cholesterol: 40 mg/dL (ref 0–99)
NonHDL: 58.07
Total CHOL/HDL Ratio: 2
Triglycerides: 91 mg/dL (ref 0.0–149.0)
VLDL: 18.2 mg/dL (ref 0.0–40.0)

## 2019-09-01 LAB — POCT GLUCOSE (DEVICE FOR HOME USE): Glucose Fasting, POC: 143 mg/dL — AB (ref 70–99)

## 2019-09-01 LAB — POCT GLYCOSYLATED HEMOGLOBIN (HGB A1C): Hemoglobin A1C: 6.9 % — AB (ref 4.0–5.6)

## 2019-09-01 LAB — FOLATE: Folate: 9.6 ng/mL (ref >5.9)

## 2019-09-01 LAB — VITAMIN B12: Vitamin B-12: 390 pg/mL (ref 211–911)

## 2019-09-01 LAB — FERRITIN: Ferritin: 28.5 ng/mL (ref 10.0–291.0)

## 2019-09-01 LAB — HM DIABETES EYE EXAM

## 2019-09-01 MED ORDER — FERROUS SULFATE 325 (65 FE) MG PO TABS: 325.0000 mg | ORAL_TABLET | Freq: Two times a day (BID) | ORAL | 1 refills | Status: DC

## 2019-09-01 NOTE — Patient Instructions (Signed)
Type 2 Diabetes Mellitus, Diagnosis, Adult Type 2 diabetes (type 2 diabetes mellitus) is a long-term (chronic) disease. In type 2 diabetes, one or both of these problems may be present:  The pancreas does not make enough of a hormone called insulin.  Cells in the body do not respond properly to insulin that the body makes (insulin resistance). Normally, insulin allows blood sugar (glucose) to enter cells in the body. The cells use glucose for energy. Insulin resistance or lack of insulin causes excess glucose to build up in the blood instead of going into cells. As a result, high blood glucose (hyperglycemia) develops. What increases the risk? The following factors may make you more likely to develop type 2 diabetes:  Having a family member with type 2 diabetes.  Being overweight or obese.  Having an inactive (sedentary) lifestyle.  Having been diagnosed with insulin resistance.  Having a history of prediabetes, gestational diabetes, or polycystic ovary syndrome (PCOS).  Being of American-Indian, African-American, Hispanic/Latino, or Asian/Pacific Islander descent. What are the signs or symptoms? In the early stage of this condition, you may not have symptoms. Symptoms develop slowly and may include:  Increased thirst (polydipsia).  Increased hunger(polyphagia).  Increased urination (polyuria).  Increased urination during the night (nocturia).  Unexplained weight loss.  Frequent infections that keep coming back (recurring).  Fatigue.  Weakness.  Vision changes, such as blurry vision.  Cuts or bruises that are slow to heal.  Tingling or numbness in the hands or feet.  Dark patches on the skin (acanthosis nigricans). How is this diagnosed? This condition is diagnosed based on your symptoms, your medical history, a physical exam, and your blood glucose level. Your blood glucose may be checked with one or more of the following blood tests:  A fasting blood glucose (FBG)  test. You will not be allowed to eat (you will fast) for 8 hours or longer before a blood sample is taken.  A random blood glucose test. This test checks blood glucose at any time of day regardless of when you ate.  An A1c (hemoglobin A1c) blood test. This test provides information about blood glucose control over the previous 2-3 months.  An oral glucose tolerance test (OGTT). This test measures your blood glucose at two times: ? After fasting. This is your baseline blood glucose level. ? Two hours after drinking a beverage that contains glucose. You may be diagnosed with type 2 diabetes if:  Your FBG level is 126 mg/dL (7.0 mmol/L) or higher.  Your random blood glucose level is 200 mg/dL (11.1 mmol/L) or higher.  Your A1c level is 6.5% or higher.  Your OGTT result is higher than 200 mg/dL (11.1 mmol/L). These blood tests may be repeated to confirm your diagnosis. How is this treated? Your treatment may be managed by a specialist called an endocrinologist. Type 2 diabetes may be treated by following instructions from your health care provider about:  Making diet and lifestyle changes. This may include: ? Following an individualized nutrition plan that is developed by a diet and nutrition specialist (registered dietitian). ? Exercising regularly. ? Finding ways to manage stress.  Checking your blood glucose level as often as told.  Taking diabetes medicines or insulin daily. This helps to keep your blood glucose levels in the healthy range. ? If you use insulin, you may need to adjust the dosage depending on how physically active you are and what foods you eat. Your health care provider will tell you how to adjust your dosage.    Taking medicines to help prevent complications from diabetes, such as: ? Aspirin. ? Medicine to lower cholesterol. ? Medicine to control blood pressure. Your health care provider will set individualized treatment goals for you. Your goals will be based on  your age, other medical conditions you have, and how you respond to diabetes treatment. Generally, the goal of treatment is to maintain the following blood glucose levels:  Before meals (preprandial): 80-130 mg/dL (4.4-7.2 mmol/L).  After meals (postprandial): below 180 mg/dL (10 mmol/L).  A1c level: less than 7%. Follow these instructions at home: Questions to ask your health care provider  Consider asking the following questions: ? Do I need to meet with a diabetes educator? ? Where can I find a support group for people with diabetes? ? What equipment will I need to manage my diabetes at home? ? What diabetes medicines do I need, and when should I take them? ? How often do I need to check my blood glucose? ? What number can I call if I have questions? ? When is my next appointment? General instructions  Take over-the-counter and prescription medicines only as told by your health care provider.  Keep all follow-up visits as told by your health care provider. This is important.  For more information about diabetes, visit: ? American Diabetes Association (ADA): www.diabetes.org ? American Association of Diabetes Educators (AADE): www.diabeteseducator.org Contact a health care provider if:  Your blood glucose is at or above 240 mg/dL (13.3 mmol/L) for 2 days in a row.  You have been sick or have had a fever for 2 days or longer, and you are not getting better.  You have any of the following problems for more than 6 hours: ? You cannot eat or drink. ? You have nausea and vomiting. ? You have diarrhea. Get help right away if:  Your blood glucose is lower than 54 mg/dL (3.0 mmol/L).  You become confused or you have trouble thinking clearly.  You have difficulty breathing.  You have moderate or large ketone levels in your urine. Summary  Type 2 diabetes (type 2 diabetes mellitus) is a long-term (chronic) disease. In type 2 diabetes, the pancreas does not make enough of a  hormone called insulin, or cells in the body do not respond properly to insulin that the body makes (insulin resistance).  This condition is treated by making diet and lifestyle changes and taking diabetes medicines or insulin.  Your health care provider will set individualized treatment goals for you. Your goals will be based on your age, other medical conditions you have, and how you respond to diabetes treatment.  Keep all follow-up visits as told by your health care provider. This is important. This information is not intended to replace advice given to you by your health care provider. Make sure you discuss any questions you have with your health care provider. Document Released: 12/10/2005 Document Revised: 02/07/2018 Document Reviewed: 01/13/2016 Elsevier Patient Education  2020 Elsevier Inc.  

## 2019-09-01 NOTE — Progress Notes (Signed)
Subjective:  Patient ID: Ashley [name], female    DOB: 04/21/1966  Age: 53 y.o. MRN: [id number]  CC: Diabetes   HPI Ashley [name] presents for f/up - She offers no new complaints today.  She continues to complain of a myriad of neurological symptoms.  She has been evaluated thoroughly by neurology.  She tells me her blood sugars have been well controlled.  Outpatient Medications Prior to Visit  Medication Sig Dispense Refill  . albuterol (PROVENTIL HFA;VENTOLIN HFA) 108 (90 Base) MCG/ACT inhaler Inhale 2 puffs into the lungs every 6 (six) hours as needed for wheezing or shortness of breath.    Marland Kitchen AQUALANCE LANCETS 30G MISC   98  . aspirin EC 81 MG tablet Take 1 tablet (81 mg total) by mouth daily. (Patient taking differently: Take 81 mg by mouth at bedtime. ) 90 tablet 1  . BAYER CONTOUR TEST test strip Testing for up to tid 100 each 98  . BOTOX 200 units SOLR INJECT 155 UNITS IN THE MUSCLE INTO HEAD AND NECK MUSCLES EVERY 3 MONTHS BY PROVIDER 1 each 0  . cholecalciferol (VITAMIN D3) 25 MCG (1000 UT) tablet Take 2,000 Units by mouth daily.    . clonazePAM (KLONOPIN) 1 MG tablet Take 1 tablet (1 mg total) by mouth 2 (two) times daily. 60 tablet 3  . dicyclomine (BENTYL) 10 MG capsule Take 1 capsule (10 mg total) by mouth 2 (two) times daily as needed (Cramping and abdominal pain). (Patient taking differently: Take 10 mg by mouth 2 (two) times a day. ) 180 capsule 1  . docusate sodium (COLACE) 100 MG capsule Take 100 mg by mouth at bedtime.    . Dulaglutide (TRULICITY) 8.41 YS/0.6TK SOPN Inject once weekly as directed 4 pen 2  . etodolac (LODINE) 400 MG tablet Take 1 tablet (400 mg total) by mouth 2 (two) times daily as needed (for back pain). 60 tablet 5  . fludrocortisone (FLORINEF) 0.1 MG tablet Take 1 tablet (0.1 mg total) by mouth daily. 90 tablet 0  . Fremanezumab-vfrm ([name]) 225 MG/1.5ML SOSY Inject 225 mg into the skin every 30 (thirty) days. 1.5 mL 11  . insulin detemir (LEVEMIR) 100  UNIT/ML injection Inject 0.3 mLs (30 Units total) into the skin daily. 10 mL 3  . Insulin Pen Needle 32G X 6 MM MISC Use daily as directed 100 each 1  . Lancet Devices (ADJUSTABLE LANCING DEVICE) MISC Up to tid testing  98  . pantoprazole (PROTONIX) 40 MG tablet Take 1 tablet (40 mg total) by mouth daily. 90 tablet 1  . rizatriptan (MAXALT-MLT) 10 MG disintegrating tablet Take 1 tablet (10 mg total) by mouth as needed for migraine. May repeat in 2 hours if needed. Do not take more than 3 tablets in a week. (Patient taking differently: Take 10 mg by mouth as needed for migraine (and may repeat once in 2 hours, if no relief (MAX OF 3 TABLETS/WK)). ) 10 tablet 3  . rosuvastatin (CRESTOR) 20 MG tablet Take 1 tablet (20 mg total) by mouth daily. 90 tablet 1  . traZODone (DESYREL) 150 MG tablet Take 1 tablet (150 mg total) by mouth at bedtime. 90 tablet 1  . venlafaxine (EFFEXOR) 37.5 MG tablet Take 1 tablet (37.5 mg total) by mouth 2 (two) times daily with a meal. 180 tablet 1  . naproxen (NAPROSYN) 500 MG tablet     . promethazine (PHENERGAN) 25 MG tablet Take 1 tablet (25 mg total) by mouth every 6 (  six) hours as needed for nausea or vomiting. 30 tablet 1  . nitroGLYCERIN (NITROSTAT) 0.4 MG SL tablet Place 1 tablet (0.4 mg total) under the tongue every 5 (five) minutes x 3 doses as needed for chest pain. (Patient not taking: Reported on 09/01/2019) 30 tablet 5   No facility-administered medications prior to visit.     ROS Review of Systems  Constitutional: Negative.  Negative for diaphoresis and fatigue.  HENT: Negative.  Negative for trouble swallowing.   Eyes: Negative.   Respiratory: Negative for cough, chest tightness, shortness of breath and wheezing.   Cardiovascular: Negative for chest pain, palpitations and leg swelling.  Gastrointestinal: Negative for abdominal pain, constipation, diarrhea, nausea and vomiting.  Endocrine: Negative.  Negative for polydipsia, polyphagia and polyuria.   Genitourinary: Negative.  Negative for difficulty urinating and urgency.  Musculoskeletal: Negative for arthralgias and myalgias.  Skin: Negative.  Negative for color change and pallor.  Neurological: Positive for numbness (and tingling). Negative for dizziness, weakness and light-headedness.  Hematological: Negative for adenopathy. Does not bruise/bleed easily.  Psychiatric/Behavioral: Negative.     Objective:  BP 134/76 (BP Location: Right Arm, Patient Position: Sitting, Cuff Size: Normal)   Pulse 88   Temp 98.3 F (36.8 C) (Tympanic)   Resp 16   Ht 5' (1.524 m)   Wt 164 lb (74.4 kg)   LMP 02/12/2014   SpO2 98%   BMI 32.03 kg/m   BP Readings from Last 3 Encounters:  09/01/19 134/76  07/28/19 136/82  07/27/19 (!) 142/62    Wt Readings from Last 3 Encounters:  09/01/19 164 lb (74.4 kg)  07/28/19 169 lb 12.8 oz (77 kg)  07/27/19 169 lb 3.2 oz (76.7 kg)    Physical Exam Vitals signs reviewed.  Constitutional:      General: She is not in acute distress.    Appearance: She is obese. She is not ill-appearing, toxic-appearing or diaphoretic.  HENT:     Nose: Nose normal.     Mouth/Throat:     Mouth: Mucous membranes are moist.     Pharynx: No oropharyngeal exudate.  Eyes:     General: No scleral icterus.    Conjunctiva/sclera: Conjunctivae normal.  Neck:     Musculoskeletal: Normal range of motion and neck supple. No neck rigidity.  Cardiovascular:     Rate and Rhythm: Normal rate and regular rhythm.     Heart sounds: No murmur.  Pulmonary:     Effort: Pulmonary effort is normal. No respiratory distress.     Breath sounds: No stridor. No wheezing, rhonchi or rales.  Abdominal:     General: Abdomen is flat. Bowel sounds are normal. There is no distension.     Palpations: There is no hepatomegaly or splenomegaly.     Tenderness: There is no abdominal tenderness.  Musculoskeletal: Normal range of motion.     Right lower leg: No edema.     Left lower leg: No edema.   Lymphadenopathy:     Cervical: No cervical adenopathy.  Skin:    General: Skin is warm and dry.     Coloration: Skin is not pale.  Neurological:     General: No focal deficit present.     Mental Status: She is alert.     Lab Results  Component Value Date   WBC 9.8 09/01/2019   HGB 14.2 09/01/2019   HCT 41.5 09/01/2019   PLT 396.0 09/01/2019   GLUCOSE 152 (H) 07/28/2019   CHOL 102 09/01/2019   TRIG  91.0 09/01/2019   HDL 43.70 09/01/2019   LDLDIRECT 152.1 01/19/2013   LDLCALC 40 09/01/2019   ALT 32 07/28/2019   AST 23 07/28/2019   NA 141 07/28/2019   K 3.6 07/28/2019   CL 106 07/28/2019   CREATININE 0.60 07/28/2019   BUN 8 07/28/2019   CO2 27 07/28/2019   TSH 1.40 12/30/2017   INR 0.91 05/14/2018   HGBA1C 6.9 (A) 09/01/2019   MICROALBUR <0.7 02/10/2019    Dg Chest 2 View  Result Date: 07/28/2019 CLINICAL DATA:  Chest pain EXAM: CHEST - 2 VIEW COMPARISON:  Apr 27, 2019 FINDINGS: There is scarring in the lingula and right middle lobe regions. No edema or consolidation. Heart size and pulmonary vascularity are normal. No adenopathy. No pneumothorax. No bone lesions. IMPRESSION: Scarring in the right middle lobe and lingular regions. No edema or consolidation. Cardiac silhouette within normal limits. No evident adenopathy. Electronically Signed   By: Lowella Grip III M.D.   On: 07/28/2019 17:30    Assessment & Plan:   Zavannah was seen today for diabetes.  Diagnoses and all orders for this visit:  Need for influenza vaccination -     Flu Vaccine QUAD 36+ mos IM  Dyslipidemia, goal LDL below 70- She has achieved her LDL goal and is doing well on the statin. -     Lipid panel; Future  Thiamine deficiency- I will monitor her thiamine level. -     Vitamin B12; Future -     Folate; Future -     Vitamin B1; Future  Diabetes mellitus with complication ([place])- She has achieved adequate glycemic control. -     POCT glycosylated hemoglobin (Hb A1C) -     POCT Glucose  (Device for Home Use)  Thrombocytosis ([place])- Her platelet count is normal now but she has mild iron deficiency. -     IBC panel; Future -     CBC with Differential/Platelet; Future -     Vitamin B12; Future -     Folate; Future -     Ferritin; Future  Dietary iron deficiency -     ferrous sulfate 325 (65 FE) MG tablet; Take 1 tablet (325 mg total) by mouth 2 (two) times daily with a meal.   I have discontinued Srinidhi B. [place]'s promethazine and naproxen. I am also having her start on ferrous sulfate. Additionally, I am having her maintain her docusate sodium, albuterol, Adjustable Lancing Device, AquaLance Lancets 30G, Bayer Contour Test, Insulin Pen Needle, aspirin EC, rizatriptan, Botox, cholecalciferol, nitroGLYCERIN, dicyclomine, rosuvastatin, clonazePAM, etodolac, venlafaxine, fludrocortisone, Trulicity, pantoprazole, traZODone, [name], and insulin detemir.  Meds ordered this encounter  Medications  . ferrous sulfate 325 (65 FE) MG tablet    Sig: Take 1 tablet (325 mg total) by mouth 2 (two) times daily with a meal.    Dispense:  180 tablet    Refill:  1     Follow-up: Return in about 6 months (around 02/29/2020).  Scarlette Calico, MD

## 2019-09-02 ENCOUNTER — Encounter: Payer: Self-pay | Admitting: Internal Medicine

## 2019-09-05 LAB — VITAMIN B1: Vitamin B1 (Thiamine): 11 nmol/L (ref 8–30)

## 2019-09-23 ENCOUNTER — Telehealth: Payer: Self-pay

## 2019-09-23 NOTE — Telephone Encounter (Signed)
PA done for [name] on cover my meds.Your request has been successfully sent to [facility] for review. You may close this dialog, return to your dashboard, and perform other tasks. To check for an update later, open this request again from your dashboard. Your request will be reviewed within 24 hours. If needed, you may contact [facility] at [phone].

## 2019-09-28 NOTE — Telephone Encounter (Signed)
Pt's insurance does require trial and failure of Aimovig and Emgality first. However the Aimovig has been denied because she is also on Botox. Looks like she has been using the copay card in the past or at least was advised to since she has never been approved for CGRP. Unless something changes with insurance she will keep getting denied because of Botox.

## 2019-09-28 NOTE — Telephone Encounter (Signed)
Denied on October 2  Unable to approve Unable to approve [name] (Fremanezumab-vfrm Subcutaneous Solution Auto-injector 225 MG/1.5ML)due to unmet criteria (5,6) as outlined in plan guideline below. Member must try and fail Aimovig and Emgality. Prior authorization required for each. Please submit the following information: [name] will not be used concurrently with Botox, Emgality, Aimovig, or Vyepti. Plan guideline HIM.PA.SP66 (Fremanezumab-vfrm ([name])) requires all of the following are met prior to consideration of approval: Migraine Prophylaxis (must meet all): 1. Diagnosis of episodic or chronic migraine; 2. Member experiences greater than 4 migraine days per month for at least 3 months; 3. Age greater at least 47 years; 4. Failure of at least 2 of the following oral migraine preventative therapies, each for 8 weeks and from different therapeutic classes, unless contraindicated or clinically significant adverse effects are experienced: antiepileptic drugs (e.g., divalproex sodium, sodium valproate, topiramate), beta-blockers (e.g., metoprolol, propranolol, timolol), antidepressants (e.g., amitriptyline, venlafaxine); 5. Failure of Aimovig and Emgality, unless contraindicated or clinically significant adverse effects are experienced; 6. [name] is not prescribed concurrently with Botox or other injectable CGRP inhibitors (e.g., Aimovig, Emgality); 7. Dose does not exceed one of the following (a or b): a. 225 mg (1 injection) once monthly; b. 675 mg (3 injections) every 3 months. Note: If you believe the member has met criteria (5,6) as described above, please resubmit your request with additional clinically supportive documentation for consideration

## 2019-09-28 NOTE — Telephone Encounter (Signed)
Ashley [name] - what have we done in the past with her?

## 2019-10-05 LAB — HEMOGLOBIN A1C: Hemoglobin A1C: 7.1

## 2019-10-08 NOTE — Telephone Encounter (Signed)
Note   I called surescripts at [phone] to state a fax was receive about the PA for [name]. I stated it was denied and we are not doing an appeal. The rep stated they will delete the PA request.

## 2019-10-13 ENCOUNTER — Other Ambulatory Visit: Payer: Self-pay | Admitting: Emergency Medicine

## 2019-10-13 DIAGNOSIS — E785 Hyperlipidemia, unspecified: Secondary | ICD-10-CM

## 2019-10-13 DIAGNOSIS — I739 Peripheral vascular disease, unspecified: Secondary | ICD-10-CM

## 2019-10-13 DIAGNOSIS — E118 Type 2 diabetes mellitus with unspecified complications: Secondary | ICD-10-CM

## 2019-10-13 MED ORDER — ROSUVASTATIN CALCIUM 20 MG PO TABS: 20.0000 mg | ORAL_TABLET | Freq: Every day | ORAL | 0 refills | Status: DC

## 2019-10-27 ENCOUNTER — Other Ambulatory Visit: Payer: Self-pay | Admitting: Internal Medicine

## 2019-10-27 DIAGNOSIS — I959 Hypotension, unspecified: Secondary | ICD-10-CM

## 2019-10-27 DIAGNOSIS — F411 Generalized anxiety disorder: Secondary | ICD-10-CM

## 2019-10-27 MED ORDER — FLUDROCORTISONE ACETATE 0.1 MG PO TABS: 0.1000 mg | ORAL_TABLET | Freq: Every day | ORAL | 0 refills | Status: DC

## 2019-10-27 MED ORDER — CLONAZEPAM 1 MG PO TABS: 1.0000 mg | ORAL_TABLET | Freq: Two times a day (BID) | ORAL | 3 refills | Status: DC

## 2019-11-03 ENCOUNTER — Other Ambulatory Visit: Payer: Self-pay | Admitting: Internal Medicine

## 2019-11-03 DIAGNOSIS — K581 Irritable bowel syndrome with constipation: Secondary | ICD-10-CM

## 2019-11-03 MED ORDER — DICYCLOMINE HCL 10 MG PO CAPS: 10.0000 mg | ORAL_CAPSULE | Freq: Two times a day (BID) | ORAL | 1 refills | Status: DC | PRN

## 2019-11-06 ENCOUNTER — Other Ambulatory Visit: Payer: Self-pay | Admitting: Family

## 2019-11-10 ENCOUNTER — Other Ambulatory Visit: Payer: Self-pay

## 2019-11-10 ENCOUNTER — Ambulatory Visit (INDEPENDENT_AMBULATORY_CARE_PROVIDER_SITE_OTHER): Payer: PRIVATE HEALTH INSURANCE | Admitting: Internal Medicine

## 2019-11-10 ENCOUNTER — Encounter: Payer: Self-pay | Admitting: Internal Medicine

## 2019-11-10 VITALS — BP 142/76 | HR 76 | Temp 98.1°F | Resp 16 | Ht 60.0 in | Wt 168.0 lb

## 2019-11-10 DIAGNOSIS — N951 Menopausal and female climacteric states: Secondary | ICD-10-CM | POA: Diagnosis not present

## 2019-11-10 DIAGNOSIS — H9193 Unspecified hearing loss, bilateral: Secondary | ICD-10-CM | POA: Diagnosis not present

## 2019-11-10 MED ORDER — ESTRADIOL 0.0375 MG/24HR TD PTWK: 0.0375 mg | MEDICATED_PATCH | TRANSDERMAL | 0 refills | Status: DC

## 2019-11-10 NOTE — Progress Notes (Signed)
Subjective:  Patient ID: Ashley [name], female    DOB: [birth year]  Age: 53 y.o. MRN: [id number]  CC: Follow-up   HPI Ashley [name] presents for f/up - She complains of a several month history of hot flashes, dyspareunia, and vaginal dryness.  Outpatient Medications Prior to Visit  Medication Sig Dispense Refill  . albuterol (PROVENTIL HFA;VENTOLIN HFA) 108 (90 Base) MCG/ACT inhaler Inhale 2 puffs into the lungs every 6 (six) hours as needed for wheezing or shortness of breath.    Marland Kitchen AQUALANCE LANCETS 30G MISC   98  . aspirin EC 81 MG tablet Take 1 tablet (81 mg total) by mouth daily. (Patient taking differently: Take 81 mg by mouth at bedtime. ) 90 tablet 1  . BAYER CONTOUR TEST test strip Testing for up to tid 100 each 98  . BOTOX 200 units SOLR INJECT 155 UNITS IN THE MUSCLE INTO HEAD AND NECK MUSCLES EVERY 3 MONTHS BY PROVIDER 1 each 0  . cholecalciferol (VITAMIN D3) 25 MCG (1000 UT) tablet Take 2,000 Units by mouth daily.    . clonazePAM (KLONOPIN) 1 MG tablet Take 1 tablet (1 mg total) by mouth 2 (two) times daily. 60 tablet 3  . dicyclomine (BENTYL) 10 MG capsule Take 1 capsule (10 mg total) by mouth 2 (two) times daily as needed (Cramping and abdominal pain). 180 capsule 1  . docusate sodium (COLACE) 100 MG capsule Take 100 mg by mouth at bedtime.    Marland Kitchen etodolac (LODINE) 400 MG tablet Take 1 tablet (400 mg total) by mouth 2 (two) times daily as needed (for back pain). 60 tablet 5  . ferrous sulfate 325 (65 FE) MG tablet Take 1 tablet (325 mg total) by mouth 2 (two) times daily with a meal. 180 tablet 1  . fludrocortisone (FLORINEF) 0.1 MG tablet Take 1 tablet (0.1 mg total) by mouth daily. 90 tablet 0  . Fremanezumab-vfrm ([name]) 225 MG/1.5ML SOSY Inject 225 mg into the skin every 30 (thirty) days. 1.5 mL 11  . insulin detemir (LEVEMIR) 100 UNIT/ML injection Inject 0.3 mLs (30 Units total) into the skin daily. 10 mL 3  . Insulin Pen Needle 32G X 6 MM MISC Use daily as directed 100 each 1   . ketorolac (ACULAR) 0.5 % ophthalmic solution One drop in operative eye(s) TID starting 2 days before surgery and continuing 3 weeks after surgery.    Elmore Guise Devices (ADJUSTABLE LANCING DEVICE) MISC Up to tid testing  98  . ofloxacin (OCUFLOX) 0.3 % ophthalmic solution Instill 1 drop TID in operative eye starting 2 days prior to surgery and after surgery for 3 weeks.    . pantoprazole (PROTONIX) 40 MG tablet Take 1 tablet (40 mg total) by mouth daily. 90 tablet 1  . prednisoLONE acetate (PRED FORTE) 1 % ophthalmic suspension One drop in operative eye(s) TID starting 2 days before surgery and after surgery for 3 weeks    . rizatriptan (MAXALT-MLT) 10 MG disintegrating tablet Take 1 tablet (10 mg total) by mouth as needed for migraine. May repeat in 2 hours if needed. Do not take more than 3 tablets in a week. (Patient taking differently: Take 10 mg by mouth as needed for migraine (and may repeat once in 2 hours, if no relief (MAX OF 3 TABLETS/WK)). ) 10 tablet 3  . rosuvastatin (CRESTOR) 20 MG tablet Take 1 tablet (20 mg total) by mouth daily. -- Office visit needed for further refills 90 tablet 0  . traZODone (DESYREL)  150 MG tablet Take 1 tablet (150 mg total) by mouth at bedtime. 90 tablet 1  . TRULICITY A999333 0000000 SOPN INJECT ONCE WEEKLY AS DIRECTED 2 mL 5  . venlafaxine (EFFEXOR) 37.5 MG tablet Take 1 tablet (37.5 mg total) by mouth 2 (two) times daily with a meal. 180 tablet 1  . nitroGLYCERIN (NITROSTAT) 0.4 MG SL tablet Place 1 tablet (0.4 mg total) under the tongue every 5 (five) minutes x 3 doses as needed for chest pain. (Patient not taking: Reported on 11/10/2019) 30 tablet 5   No facility-administered medications prior to visit.     ROS Review of Systems  Constitutional: Negative.  Negative for chills and fever.  HENT: Positive for hearing loss. Negative for ear pain.   Eyes: Negative for visual disturbance.  Respiratory: Negative for cough, chest tightness, shortness of breath  and wheezing.   Cardiovascular: Negative for chest pain, palpitations and leg swelling.  Gastrointestinal: Negative for abdominal pain, diarrhea, nausea and vomiting.  Endocrine: Negative.   Genitourinary: Negative.  Negative for decreased urine volume, difficulty urinating, frequency, pelvic pain, vaginal bleeding, vaginal discharge and vaginal pain.  Skin: Negative.  Negative for color change.  Neurological: Negative.  Negative for dizziness and weakness.  Hematological: Negative for adenopathy. Does not bruise/bleed easily.  Psychiatric/Behavioral: Negative.     Objective:  BP (!) 142/76 (BP Location: Left Arm, Patient Position: Sitting, Cuff Size: Large)   Pulse 76   Temp 98.1 F (36.7 C) (Oral)   Resp 16   Ht 5' (1.524 m)   Wt 168 lb (76.2 kg)   LMP 02/12/2014   SpO2 97%   BMI 32.81 kg/m   BP Readings from Last 3 Encounters:  11/10/19 (!) 142/76  09/01/19 134/76  07/28/19 136/82    Wt Readings from Last 3 Encounters:  11/10/19 168 lb (76.2 kg)  09/01/19 164 lb (74.4 kg)  07/28/19 169 lb 12.8 oz (77 kg)    Physical Exam Vitals signs reviewed.  HENT:     Right Ear: Hearing, tympanic membrane, ear canal and external ear normal.     Left Ear: Hearing, tympanic membrane, ear canal and external ear normal.     Nose: Nose normal.     Mouth/Throat:     Mouth: Mucous membranes are moist.  Eyes:     General: No scleral icterus.    Conjunctiva/sclera: Conjunctivae normal.  Neck:     Musculoskeletal: Neck supple.  Cardiovascular:     Rate and Rhythm: Normal rate and regular rhythm.     Heart sounds: No murmur.  Pulmonary:     Effort: Pulmonary effort is normal.     Breath sounds: No stridor. No wheezing, rhonchi or rales.  Abdominal:     General: Abdomen is flat. Bowel sounds are normal.     Palpations: There is no hepatomegaly, splenomegaly or mass.     Tenderness: There is no abdominal tenderness.  Genitourinary:    Comments: GU/pelvic exam deferred at her  request. Musculoskeletal: Normal range of motion.     Right lower leg: No edema.     Left lower leg: No edema.  Lymphadenopathy:     Cervical: No cervical adenopathy.  Skin:    General: Skin is warm and dry.     Coloration: Skin is not pale.  Neurological:     General: No focal deficit present.     Lab Results  Component Value Date   WBC 9.8 09/01/2019   HGB 14.2 09/01/2019   HCT 41.5  09/01/2019   PLT 396.0 09/01/2019   GLUCOSE 152 (H) 07/28/2019   CHOL 102 09/01/2019   TRIG 91.0 09/01/2019   HDL 43.70 09/01/2019   LDLDIRECT 152.1 01/19/2013   LDLCALC 40 09/01/2019   ALT 32 07/28/2019   AST 23 07/28/2019   NA 141 07/28/2019   K 3.6 07/28/2019   CL 106 07/28/2019   CREATININE 0.60 07/28/2019   BUN 8 07/28/2019   CO2 27 07/28/2019   TSH 1.40 12/30/2017   INR 0.91 05/14/2018   HGBA1C 7.1 10/05/2019   MICROALBUR <0.7 02/10/2019    Dg Chest 2 View  Result Date: 07/28/2019 CLINICAL DATA:  Chest pain EXAM: CHEST - 2 VIEW COMPARISON:  Apr 27, 2019 FINDINGS: There is scarring in the lingula and right middle lobe regions. No edema or consolidation. Heart size and pulmonary vascularity are normal. No adenopathy. No pneumothorax. No bone lesions. IMPRESSION: Scarring in the right middle lobe and lingular regions. No edema or consolidation. Cardiac silhouette within normal limits. No evident adenopathy. Electronically Signed   By: Lowella Grip III M.D.   On: 07/28/2019 17:30    Assessment & Plan:   Ashley [name] was seen today for follow-up.  Diagnoses and all orders for this visit:  Hot flashes, menopausal- I recommended that she start using an estradiol patch.  For the dyspareunia and vaginal dryness I recommended that she use vitamin E oil. -     estradiol (CLIMARA - DOSED IN MG/24 HR) 0.0375 mg/24hr patch; Place 1 patch (0.0375 mg total) onto the skin once a week.  Bilateral hearing loss, unspecified hearing loss type -     Ambulatory referral to Audiology   I am having  Ashley [name] start on estradiol. I am also having her maintain her docusate sodium, albuterol, Adjustable Lancing Device, AquaLance Lancets 30G, Bayer Contour Test, Insulin Pen Needle, aspirin EC, rizatriptan, Botox, cholecalciferol, nitroGLYCERIN, etodolac, venlafaxine, pantoprazole, traZODone, [name], insulin detemir, ferrous sulfate, rosuvastatin, fludrocortisone, clonazePAM, dicyclomine, Trulicity, ketorolac, ofloxacin, and prednisoLONE acetate.  Meds ordered this encounter  Medications  . estradiol (CLIMARA - DOSED IN MG/24 HR) 0.0375 mg/24hr patch    Sig: Place 1 patch (0.0375 mg total) onto the skin once a week.    Dispense:  12 patch    Refill:  0     Follow-up: Return in about 3 months (around 02/10/2020).  Ashley Calico, MD

## 2019-11-10 NOTE — Patient Instructions (Signed)
Atrophic Vaginitis Atrophic vaginitis is a condition in which the tissues that line the vagina become dry and thin. This condition occurs in women who have stopped having their period. It is caused by a drop in a female hormone (estrogen). This hormone helps:  To keep the vagina moist.  To make a clear fluid. This clear fluid helps: ? To make the vagina ready for sex. ? To protect the vagina from infection. If the lining of the vagina is dry and thin, it may cause irritation, burning, or itchiness. It may also:  Make sex painful.  Make an exam of your vagina painful.  Cause bleeding.  Make you lose interest in sex.  Cause a burning feeling when you pee (urinate).  Cause a brown or yellow fluid to come from your vagina. Some women do not have symptoms. Follow these instructions at home: Medicines  Take over-the-counter and prescription medicines only as told by your doctor.  Do not use herbs or other medicines unless your doctor says it is okay.  Use medicines for for dryness. These include: ? Oils to make the vagina soft. ? Creams. ? Moisturizers. General instructions  Do not douche.  Do not use products that can make your vagina dry. These include: ? Scented sprays. ? Scented tampons. ? Scented soaps.  Sex can help increase blood flow and soften the tissue in the vagina. If it hurts to have sex: ? Tell your partner. ? Use products to make sex more comfortable. Use these only as told by your doctor. Contact a doctor if you:  Have discharge from the vagina that is different than usual.  Have a bad smell coming from your vagina.  Have new symptoms.  Do not get better.  Get worse. Summary  Atrophic vaginitis is a condition in which the lining of the vagina becomes dry and thin.  This condition affects women who have stopped having their periods.  Treatment may include using products that help make the vagina soft.  Call a doctor if do not get better with  treatment. This information is not intended to replace advice given to you by your health care provider. Make sure you discuss any questions you have with your health care provider. Document Released: 05/28/2008 Document Revised: 12/23/2017 Document Reviewed: 12/23/2017 Elsevier Patient Education  2020 Elsevier Inc.  

## 2019-11-14 ENCOUNTER — Other Ambulatory Visit: Payer: Self-pay | Admitting: Internal Medicine

## 2019-11-17 ENCOUNTER — Other Ambulatory Visit: Payer: Self-pay | Admitting: Internal Medicine

## 2019-11-18 ENCOUNTER — Ambulatory Visit (INDEPENDENT_AMBULATORY_CARE_PROVIDER_SITE_OTHER): Payer: PRIVATE HEALTH INSURANCE | Admitting: Internal Medicine

## 2019-11-18 ENCOUNTER — Other Ambulatory Visit: Payer: Self-pay

## 2019-11-18 DIAGNOSIS — F411 Generalized anxiety disorder: Secondary | ICD-10-CM | POA: Diagnosis not present

## 2019-11-18 DIAGNOSIS — R05 Cough: Secondary | ICD-10-CM | POA: Diagnosis not present

## 2019-11-18 DIAGNOSIS — R062 Wheezing: Secondary | ICD-10-CM | POA: Diagnosis not present

## 2019-11-18 DIAGNOSIS — R059 Cough, unspecified: Secondary | ICD-10-CM

## 2019-11-18 DIAGNOSIS — Z20828 Contact with and (suspected) exposure to other viral communicable diseases: Secondary | ICD-10-CM

## 2019-11-18 DIAGNOSIS — Z20822 Contact with and (suspected) exposure to covid-19: Secondary | ICD-10-CM

## 2019-11-18 MED ORDER — HYDROCODONE-HOMATROPINE 5-1.5 MG/5ML PO SYRP: 5.0000 mL | ORAL_SOLUTION | Freq: Four times a day (QID) | ORAL | 0 refills | Status: AC | PRN

## 2019-11-18 MED ORDER — AZITHROMYCIN 250 MG PO TABS: ORAL_TABLET | ORAL | 1 refills | Status: DC

## 2019-11-18 MED ORDER — PREDNISONE 10 MG PO TABS: ORAL_TABLET | ORAL | 0 refills | Status: DC

## 2019-11-18 NOTE — Progress Notes (Signed)
Patient ID: Ashley [name], female   DOB: 19-Jul-1966, 53 y.o.   MRN: [id number]  Virtual Visit via Video Note  I connected with Ashley [name] on 11/18/19 at  2:40 PM EST by a video enabled telemedicine application and verified that I am speaking with the correct person using two identifiers.  Location: Patient: at home Provider: at office   I discussed the limitations of evaluation and management by telemedicine and the availability of in person appointments. The patient expressed understanding and agreed to proceed.  History of Present Illness: Here with acute onset mild to mod 2-3 days ST, HA, general weakness and malaise, with prod cough greenish sputum, but Pt denies chest pain, increased sob or doe, wheezing, orthopnea, PND, increased LE swelling, palpitations, dizziness or syncope, except for mild wheezing x 2 days, nasal congestion, general weakness and shakiness, just cant get a deep breath.  Pt COVID neg x 3 days.  Pt denies new neurological symptoms such as new headache, or facial or extremity weakness or numbness   Pt denies polydipsia, polyuria   Past Medical History:  Diagnosis Date  . Anxiety   . Bronchitis    uses inhaler if needed for bronchitis, lasted used -2014  . Cataract    bilateral  . Chronic headaches    migraines - in past, uses Phenergan for nausea   . Deficiency anemia 12/30/2017  . Diabetes mellitus without complication ([place])    taken off Metformin since 04/2014, HgbA1C - normal, will follow up with PCP- Dr. Deborra Medina, 07/2014  . GERD (gastroesophageal reflux disease)   . H/O exercise stress test 2011   done at [facility]- told that it was WNL, done due to pt. having panic attacks   . History of blood transfusion 09-30-66   at birth in [place], [place], unsure number of units  . Poor dentition    very poor oral health   . SVD (spontaneous vaginal delivery)    x 3  . Tobacco abuse    Past Surgical History:  Procedure Laterality Date  . ABDOMINAL HYSTERECTOMY    .  APPENDECTOMY  1988  . CARDIAC CATHETERIZATION N/A 08/17/2016   Procedure: Left Heart Cath and Coronary Angiography;  Surgeon: Burnell Blanks, MD;  Location: [facility];  Service: Cardiovascular;  Laterality: N/A;  . CHOLECYSTECTOMY N/A 06/07/2014   Procedure: LAPAROSCOPIC CHOLECYSTECTOMY;  Surgeon: Ralene Ok, MD;  Location: [facility];  Service: General;  Laterality: N/A;  . CHOLECYSTECTOMY  June 07 2014  . COLONOSCOPY  02/16/2014   normal   . ESOPHAGOGASTRODUODENOSCOPY  12/28/2013  . KNEE ARTHROSCOPY  1995   left  . LAPAROSCOPIC ASSISTED VAGINAL HYSTERECTOMY N/A 04/15/2014   Procedure: LAPAROSCOPIC ASSISTED VAGINAL HYSTERECTOMY;  Surgeon: Marylynn Pearson, MD;  Location: [facility];  Service: Gynecology;  Laterality: N/A;  . LAPAROSCOPIC BILATERAL SALPINGO OOPHERECTOMY Bilateral 04/15/2014   Procedure: LAPAROSCOPIC BILATERAL SALPINGO OOPHORECTOMY;  Surgeon: Marylynn Pearson, MD;  Location: [facility];  Service: Gynecology;  Laterality: Bilateral;  . TUBAL LIGATION      reports that she has been smoking cigarettes. She has a 70.00 pack-year smoking history. She has never used smokeless tobacco. She reports that she does not drink alcohol or use drugs. family history includes Alcohol abuse in her father; Anxiety disorder in her mother; COPD in her maternal grandmother; Dementia in her maternal grandmother; Depression in her mother; Diabetes in her mother; Drug abuse in her mother; Emphysema in her maternal grandfather; Hyperlipidemia in her mother; Hypertension in her  mother; Lung cancer in her maternal grandfather; Prostate cancer in her father; Thyroid cancer in her paternal aunt. Allergies  Allergen Reactions  . Penicillins Anaphylaxis and Swelling    Throat swells Has patient had a PCN reaction causing immediate rash, facial/tongue/throat swelling, SOB or lightheadedness with hypotension: Yes Has patient had a PCN reaction causing severe rash involving mucus membranes or skin necrosis:  No Has patient had a PCN reaction that required hospitalization: No Has patient had a PCN reaction occurring within the last 10 years: No If all of the above answers are "NO", then may proceed with Cephalosporin use.   . Topamax [Topiramate] Other (See Comments)    Hands and feet  go numb  . Metformin And Related     GI side effects    Current Outpatient Medications on File Prior to Visit  Medication Sig Dispense Refill  . albuterol (PROVENTIL HFA;VENTOLIN HFA) 108 (90 Base) MCG/ACT inhaler Inhale 2 puffs into the lungs every 6 (six) hours as needed for wheezing or shortness of breath.    Marland Kitchen AQUALANCE LANCETS 30G MISC   98  . aspirin EC 81 MG tablet Take 1 tablet (81 mg total) by mouth daily. (Patient taking differently: Take 81 mg by mouth at bedtime. ) 90 tablet 1  . BAYER CONTOUR TEST test strip Testing for up to tid 100 each 98  . BOTOX 200 units SOLR INJECT 155 UNITS IN THE MUSCLE INTO HEAD AND NECK MUSCLES EVERY 3 MONTHS BY PROVIDER 1 each 0  . cholecalciferol (VITAMIN D3) 25 MCG (1000 UT) tablet Take 2,000 Units by mouth daily.    . clonazePAM (KLONOPIN) 1 MG tablet Take 1 tablet (1 mg total) by mouth 2 (two) times daily. 60 tablet 3  . dicyclomine (BENTYL) 10 MG capsule Take 1 capsule (10 mg total) by mouth 2 (two) times daily as needed (Cramping and abdominal pain). 180 capsule 1  . docusate sodium (COLACE) 100 MG capsule Take 100 mg by mouth at bedtime.    Marland Kitchen estradiol (CLIMARA - DOSED IN MG/24 HR) 0.0375 mg/24hr patch Place 1 patch (0.0375 mg total) onto the skin once a week. 12 patch 0  . etodolac (LODINE) 400 MG tablet Take 1 tablet (400 mg total) by mouth 2 (two) times daily as needed (for back pain). 60 tablet 5  . ferrous sulfate 325 (65 FE) MG tablet Take 1 tablet (325 mg total) by mouth 2 (two) times daily with a meal. 180 tablet 1  . fludrocortisone (FLORINEF) 0.1 MG tablet Take 1 tablet (0.1 mg total) by mouth daily. 90 tablet 0  . Fremanezumab-vfrm ([name]) 225 MG/1.5ML  SOSY Inject 225 mg into the skin every 30 (thirty) days. 1.5 mL 11  . insulin detemir (LEVEMIR) 100 UNIT/ML injection Inject 0.3 mLs (30 Units total) into the skin daily. 10 mL 3  . Insulin Pen Needle 32G X 6 MM MISC Use daily as directed 100 each 1  . ketorolac (ACULAR) 0.5 % ophthalmic solution One drop in operative eye(s) TID starting 2 days before surgery and continuing 3 weeks after surgery.    Elmore Guise Devices (ADJUSTABLE LANCING DEVICE) MISC Up to tid testing  98  . nitroGLYCERIN (NITROSTAT) 0.4 MG SL tablet Place 1 tablet (0.4 mg total) under the tongue every 5 (five) minutes x 3 doses as needed for chest pain. (Patient not taking: Reported on 11/10/2019) 30 tablet 5  . ofloxacin (OCUFLOX) 0.3 % ophthalmic solution Instill 1 drop TID in operative eye starting 2 days  prior to surgery and after surgery for 3 weeks.    . pantoprazole (PROTONIX) 40 MG tablet Take 1 tablet (40 mg total) by mouth daily. 90 tablet 1  . prednisoLONE acetate (PRED FORTE) 1 % ophthalmic suspension One drop in operative eye(s) TID starting 2 days before surgery and after surgery for 3 weeks    . rizatriptan (MAXALT-MLT) 10 MG disintegrating tablet Take 1 tablet (10 mg total) by mouth as needed for migraine. May repeat in 2 hours if needed. Do not take more than 3 tablets in a week. (Patient taking differently: Take 10 mg by mouth as needed for migraine (and may repeat once in 2 hours, if no relief (MAX OF 3 TABLETS/WK)). ) 10 tablet 3  . rosuvastatin (CRESTOR) 20 MG tablet Take 1 tablet (20 mg total) by mouth daily. -- Office visit needed for further refills 90 tablet 0  . traZODone (DESYREL) 150 MG tablet Take 1 tablet (150 mg total) by mouth at bedtime. 90 tablet 1  . TRULICITY A999333 0000000 SOPN INJECT ONCE WEEKLY AS DIRECTED 2 mL 5  . venlafaxine (EFFEXOR) 37.5 MG tablet Take 1 tablet (37.5 mg total) by mouth 2 (two) times daily with a meal. 180 tablet 1   No current facility-administered medications on file prior to  visit.    Observations/Objective: Alert, NAD, appropriate mood and affect, resps normal, cn 2-12 intact, moves all 4s, no visible rash or swelling Lab Results  Component Value Date   WBC 9.8 09/01/2019   HGB 14.2 09/01/2019   HCT 41.5 09/01/2019   PLT 396.0 09/01/2019   GLUCOSE 152 (H) 07/28/2019   CHOL 102 09/01/2019   TRIG 91.0 09/01/2019   HDL 43.70 09/01/2019   LDLDIRECT 152.1 01/19/2013   LDLCALC 40 09/01/2019   ALT 32 07/28/2019   AST 23 07/28/2019   NA 141 07/28/2019   K 3.6 07/28/2019   CL 106 07/28/2019   CREATININE 0.60 07/28/2019   BUN 8 07/28/2019   CO2 27 07/28/2019   TSH 1.40 12/30/2017   INR 0.91 05/14/2018   HGBA1C 7.1 10/05/2019   MICROALBUR <0.7 02/10/2019   Assessment and Plan: See notes  Follow Up Instructions: See notes   I discussed the assessment and treatment plan with the patient. The patient was provided an opportunity to ask questions and all were answered. The patient agreed with the plan and demonstrated an understanding of the instructions.   The patient was advised to call back or seek an in-person evaluation if the symptoms worsen or if the condition fails to improve as anticipated   Cathlean Cower, MD

## 2019-11-18 NOTE — Patient Instructions (Signed)
Please take all new medication as prescribed - the antibiotic, cough medicine, and prednisone  [facility]  Please continue all other medications as before  Please have the pharmacy call with any other refills you may need.  Please continue your efforts at being more active, low cholesterol diet, and weight control.  Please keep your appointments with your specialists as you may have planned

## 2019-11-20 ENCOUNTER — Encounter: Payer: Self-pay | Admitting: Internal Medicine

## 2019-11-20 DIAGNOSIS — R059 Cough, unspecified: Secondary | ICD-10-CM | POA: Insufficient documentation

## 2019-11-20 DIAGNOSIS — R062 Wheezing: Secondary | ICD-10-CM | POA: Insufficient documentation

## 2019-11-20 DIAGNOSIS — R05 Cough: Secondary | ICD-10-CM | POA: Insufficient documentation

## 2019-11-20 NOTE — Assessment & Plan Note (Signed)
Mild to mod, c/w bronchitis vs pna, declines cxr, for antibx course, to f/u any worsening symptoms or concerns  

## 2019-11-20 NOTE — Assessment & Plan Note (Signed)
Mild situational, reassured, to continue current tx

## 2019-11-20 NOTE — Assessment & Plan Note (Signed)
/  Mild to mod, for predpac asd,  to f/u any worsening symptoms or concerns 

## 2019-11-23 ENCOUNTER — Other Ambulatory Visit: Payer: Self-pay

## 2019-11-23 DIAGNOSIS — Z20822 Contact with and (suspected) exposure to covid-19: Secondary | ICD-10-CM

## 2019-11-24 ENCOUNTER — Telehealth: Payer: Self-pay | Admitting: Neurology

## 2019-11-24 NOTE — Telephone Encounter (Signed)
I called and spoke with Cornelia Copa, he states patient cant be approved for Botox if she is going to continue [name]. Please advise, I will call him back tomorrow with the response. DW

## 2019-11-24 NOTE — Telephone Encounter (Signed)
Called the pt and LVM (ok per DPR) advising botox cannot be approved by insurance if she is going to continue [name]. I asked the pt for a call back to let us know what she would like to do. Advised we need to talk to her before her appt on 12/3 as this may delay the appt. Left office number and hours in the message.

## 2019-11-24 NOTE — Telephone Encounter (Signed)
Pt called. She had not gotten my message. She said she had been tested for COVID yesterday and had a bad cough. I canceled her 12/3 appt. We discussed the insurance/ajovy/botox issue. She said they are in the process of changing insurances. She has been getting the [name] with the savings card. The patient stated she would call back later to r/s her botox appt and will provide her new insurance info at that time so Andee Poles can try to get botox approved.

## 2019-11-24 NOTE — Telephone Encounter (Signed)
Ashley [name] @ [employer] states their insurance company would like to know if the Dr will consider taking pt off [name] while taking Botox.  If so they can approve 2 visits, please call

## 2019-11-25 LAB — NOVEL CORONAVIRUS, NAA: SARS-CoV-2, NAA: NOT DETECTED

## 2019-11-25 NOTE — Telephone Encounter (Signed)
Noted, thank you. DW  °

## 2019-11-26 ENCOUNTER — Ambulatory Visit: Payer: PRIVATE HEALTH INSURANCE | Admitting: Neurology

## 2019-11-26 NOTE — Telephone Encounter (Signed)
Ashley [name] with [employer] called in regards to the patients [name] authorization but states if he doesn't hear back by today 2:00pm  He will have to deny it. Please follow up.

## 2019-11-30 ENCOUNTER — Telehealth: Payer: Self-pay | Admitting: Internal Medicine

## 2019-11-30 NOTE — Telephone Encounter (Signed)
Ok for OV any provider

## 2019-11-30 NOTE — Telephone Encounter (Signed)
Doxy has been made with Dr.Crawford for tomorrow 12/8.

## 2019-11-30 NOTE — Telephone Encounter (Signed)
Patient was seen by Dr.John.

## 2019-11-30 NOTE — Telephone Encounter (Signed)
Patient calling because her cough is not getting any better even after being on antibiotics. She had covid test and it was negative. Wanted to be seen in office

## 2019-11-30 NOTE — Telephone Encounter (Signed)
Please refer to previous phone notes. DW

## 2019-12-01 ENCOUNTER — Ambulatory Visit (INDEPENDENT_AMBULATORY_CARE_PROVIDER_SITE_OTHER)
Admission: RE | Admit: 2019-12-01 | Discharge: 2019-12-01 | Disposition: A | Discharge status: Home / Self Care | Payer: Self-pay | Source: Ambulatory Visit | Attending: Internal Medicine | Admitting: Internal Medicine

## 2019-12-01 ENCOUNTER — Encounter: Payer: Self-pay | Admitting: Internal Medicine

## 2019-12-01 ENCOUNTER — Other Ambulatory Visit: Payer: Self-pay

## 2019-12-01 ENCOUNTER — Ambulatory Visit (INDEPENDENT_AMBULATORY_CARE_PROVIDER_SITE_OTHER): Payer: PRIVATE HEALTH INSURANCE | Admitting: Internal Medicine

## 2019-12-01 DIAGNOSIS — R059 Cough, unspecified: Secondary | ICD-10-CM

## 2019-12-01 DIAGNOSIS — R05 Cough: Secondary | ICD-10-CM

## 2019-12-01 MED ORDER — HYDROCODONE-HOMATROPINE 5-1.5 MG/5ML PO SYRP: 5.0000 mL | ORAL_SOLUTION | Freq: Two times a day (BID) | ORAL | 0 refills | Status: DC | PRN

## 2019-12-01 MED ORDER — ALBUTEROL SULFATE HFA 108 (90 BASE) MCG/ACT IN AERS: 2.0000 | INHALATION_SPRAY | Freq: Four times a day (QID) | RESPIRATORY_TRACT | 0 refills | Status: DC | PRN

## 2019-12-01 NOTE — Progress Notes (Signed)
Virtual Visit via Video Note  I connected with Ashley [name] on 12/01/19 at 10:00 AM EST by a video enabled telemedicine application and verified that I am speaking with the correct person using two identifiers.  The patient and the provider were at separate locations throughout the entire encounter.   I discussed the limitations of evaluation and management by telemedicine and the availability of in person appointments. The patient expressed understanding and agreed to proceed. The patient and the provider were the only parties present for the visit unless noted in HPI below.  History of Present Illness: The patient is a 53 y.o. female with visit for continued SOB, cough, wheezing. Started about 2 weeks ago. Has been quarantining since onset. Covid-19 test negative about 1 week ago. With cough and SOB and wheezing since onset. Gets bronchitis often and usually gets wheezing with this. Seen virtually by provider about 2 weeks ago and prescribed prednisone 9 day course, z-pack and cough medicine. Took all these and did not get any relief even while taking them. Has some mild chills but no fever throughout. Denies sick contacts or leaving home in last 2 weeks. Overall it is not improving. Some new pain with breathing in the last 3-4 days. Has no albuterol inhaler at home although this is on active med list. She has had continued wheezing and feels like there is a lot of congestion deep in her lungs  Observations/Objective: Appearance: normal, breathing appears normal, no dyspnea during visit, dry cough during visit, no audible wheeze with talking during visit, casual grooming, abdomen does not appear distended, throat not visualized, memory normal, mental status is A and O times 3  Assessment and Plan: See problem oriented charting  Follow Up Instructions: CXR, albuterol inhaler and hycodan, depending on CXR results may need additional antibiotic  Visit time 25 minutes: greater than 50% of that time was  spent in face to face counseling and coordination of care with the patient: counseled about review of course, past treatment, need for more treatment and options based on results of imaging  I discussed the assessment and treatment plan with the patient. The patient was provided an opportunity to ask questions and all were answered. The patient agreed with the plan and demonstrated an understanding of the instructions.   The patient was advised to call back or seek an in-person evaluation if the symptoms worsen or if the condition fails to improve as anticipated.  Hoyt Koch, MD

## 2019-12-01 NOTE — Assessment & Plan Note (Signed)
CXR ordered, albuterol inhaler ordered. Rx hycodan and [place] narcotic database reviewed and no inappropriate fills. Depending on CXR may need additional antibiotics.

## 2019-12-23 ENCOUNTER — Other Ambulatory Visit: Payer: Self-pay | Admitting: Internal Medicine

## 2019-12-23 NOTE — Telephone Encounter (Signed)
Copied from [place] [id number]. Topic: Quick Communication - Rx Refill/Question >> Dec 23, 2019 10:42 AM Yvette Rack wrote: Medication: albuterol (VENTOLIN HFA) 108 (90 Base) MCG/ACT inhaler  Has the patient contacted their pharmacy? yes   Preferred Pharmacy (with phone number or street name): [id number] - [address], [address]  Phone: [phone]  Fax: [phone]  Agent: Please be advised that RX refills may take up to 3 business days. We ask that you follow-up with your pharmacy.

## 2019-12-23 NOTE — Telephone Encounter (Signed)
Requested medication (s) are due for refill today: no  Requested medication (s) are on the active medication list: yes  Last refill:  12/01/2019  Future visit scheduled: no  Notes to clinic:  one inhaler should last at least one month   Requested Prescriptions  Pending Prescriptions Disp Refills   albuterol (VENTOLIN HFA) 108 (90 Base) MCG/ACT inhaler 6.7 g 0    Sig: Inhale 2 puffs into the lungs every 6 (six) hours as needed for wheezing or shortness of breath.      Pulmonology:  Beta Agonists Failed - 12/23/2019 10:52 AM      Failed - One inhaler should last at least one month. If the patient is requesting refills earlier, contact the patient to check for uncontrolled symptoms.      Passed - Valid encounter within last 12 months    Recent Outpatient Visits           3 weeks ago Cough   [facility] -Chuck Hint, MD   1 month ago Exposure to COVID-19 virus   [facility], Hunt Oris, MD   1 month ago Hot flashes, menopausal   [place], MD   3 months ago Need for influenza vaccination   Hodgkins, MD   4 months ago Breast pain, left   [place], Marvis Repress, [place]

## 2019-12-25 HISTORY — PX: BREAST LUMPECTOMY: SHX2

## 2019-12-28 ENCOUNTER — Other Ambulatory Visit: Payer: Self-pay | Admitting: Internal Medicine

## 2019-12-28 DIAGNOSIS — E1165 Type 2 diabetes mellitus with hyperglycemia: Secondary | ICD-10-CM

## 2019-12-28 DIAGNOSIS — E118 Type 2 diabetes mellitus with unspecified complications: Secondary | ICD-10-CM

## 2019-12-28 MED ORDER — INSULIN DETEMIR 100 UNIT/ML ~~LOC~~ SOLN: 30.0000 [IU] | Freq: Every day | SUBCUTANEOUS | 1 refills | Status: DC

## 2019-12-31 NOTE — Telephone Encounter (Signed)
We received another PA for [name]. Currently holding on that d/t the fact that we are waiting on the patient to call back with new insurance to try to get botox approved. See 11/24/19 phone note.

## 2020-01-07 ENCOUNTER — Other Ambulatory Visit: Payer: Self-pay | Admitting: Neurology

## 2020-01-09 ENCOUNTER — Other Ambulatory Visit: Payer: Self-pay | Admitting: Internal Medicine

## 2020-01-09 DIAGNOSIS — E118 Type 2 diabetes mellitus with unspecified complications: Secondary | ICD-10-CM

## 2020-01-09 DIAGNOSIS — I739 Peripheral vascular disease, unspecified: Secondary | ICD-10-CM

## 2020-01-09 DIAGNOSIS — E785 Hyperlipidemia, unspecified: Secondary | ICD-10-CM

## 2020-01-14 ENCOUNTER — Other Ambulatory Visit: Payer: Self-pay | Admitting: Internal Medicine

## 2020-01-19 ENCOUNTER — Other Ambulatory Visit: Payer: Self-pay

## 2020-01-19 ENCOUNTER — Encounter: Payer: Self-pay | Admitting: Neurology

## 2020-01-19 ENCOUNTER — Ambulatory Visit: Payer: 59 | Admitting: Neurology

## 2020-01-19 ENCOUNTER — Other Ambulatory Visit: Payer: Self-pay | Admitting: Internal Medicine

## 2020-01-19 ENCOUNTER — Telehealth: Payer: Self-pay | Admitting: Neurology

## 2020-01-19 VITALS — BP 136/64 | HR 96 | Temp 97.1°F | Ht 62.0 in | Wt 165.0 lb

## 2020-01-19 DIAGNOSIS — G43711 Chronic migraine without aura, intractable, with status migrainosus: Secondary | ICD-10-CM | POA: Diagnosis not present

## 2020-01-19 DIAGNOSIS — I959 Hypotension, unspecified: Secondary | ICD-10-CM

## 2020-01-19 DIAGNOSIS — F411 Generalized anxiety disorder: Secondary | ICD-10-CM

## 2020-01-19 MED ORDER — VENLAFAXINE HCL ER 150 MG PO CP24: 150.0000 mg | ORAL_CAPSULE | Freq: Every day | ORAL | 3 refills | Status: DC

## 2020-01-19 MED ORDER — NURTEC 75 MG PO TBDP: 75.0000 mg | ORAL_TABLET | Freq: Every day | ORAL | 6 refills | Status: DC | PRN

## 2020-01-19 NOTE — Telephone Encounter (Signed)
Patient has new insurance please try to get botox approved. She is on ajovy as well and understands they may not pay for both. Call patient for appointment with [facility] thanks

## 2020-01-19 NOTE — Telephone Encounter (Signed)
Pt previously signed consent. Botox charge sheet completed and is pending MD signature.

## 2020-01-19 NOTE — Progress Notes (Signed)
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  She was doing exceptionally well when on [name] and Botox her migraines were reduced to 3 migraines days a month (prior baseline was daily headaches and daily migraines). The [name] does help but she still has 15 headache days a month and 8 migraine days a month > 6 months without the botox. No medication overuse. No aura. chronic migraines have returned off of the botox, she has daily headaches, unilateral, pounding, throbbing, nausea, vomiting, can last 24 to 72 hours, no medication overuse, no aura, movement makes it worse, daily headaches with 15 migraine days a month for 6 months since stopping Botox.  At this point we really need to restart Botox. Patient is on [name] and feels the combination was the best.   Maxalt is not working anymore. I would suggest increasing Venlafaxine to 75mg  as this is a good migraine preventative. We can even go up further to 225mg  of the effexor discussed risk of seratonin syndrome with patient.     Interval history June 29, 2019: Patient has been coming to neurology clinic for many years mostly for her migraine disorder but also for multiple neurologic and transient issues thought to be at least a component of conversion disorder.  She has done well as far as her migraines are concerned on Botox which has been delayed due to coronavirus and appears that last injection was over 6 months ago.  Patient did exceptionally well on Botox with greater than 50% decrease in severity and frequency.  However patient has not had Botox for over 6 months, chronic migraines have returned, she has daily headaches, unilateral, pounding,  throbbing, nausea, vomiting, can last 24 to 72 hours, no medication overuse, no aura, movement makes it worse, daily headaches with 15 migraine days a month for 6 months since stopping Botox.  At this point we really need to restart Botox.  Patient is NOT on CGRP at this time.  Due to complaints of persistent memory loss which I do believe again mostly due to life stressors, component of conversion disorder, multiple medical disorders and mood disorders and sedentary lifestyle, she was sent for formal memory testing.  Not consistent with patterns typically identified with degenerative cortical dementia such as Alzheimer's.  Also noted a history of anxiety.  It was recommended that she have repeat testing if needed.  Dr. Sima Matas also stated history of migraines and possible thiamine deficiency, discussed f/u ith pcp and continue supplement 100mg  day.  Also question was possible vascular issues, we will order CT Angio of the head and neck for patient due to this however MRIs of the brain have been normal multiple times in the past and it is doubtful that patient has any vascular pathology but cannot rule this out especially given results of testing and need to evaluate.  She continues to fall, she has pain and lightning bolt from the neck to the back possible llheumitte's sign, happens when she is washing dishes with neck flexion. She continues to fall, feels imbalanced, difficulty walking, falls frequently every 1-2 weeks recently fractured finger.  MRI of the lumbar spine in the past has been essentially normal.  She never has had an MRI  of the cervical spine and we will order that, she does feel ataxic in the arms and the legs without any localizing symptoms to the thoracic spine we will order MRI of the cervical spine.   Interval history: 54 y.o. female here as a referral from Dr. Deborra Medina for new onset tremor. Past medical history of intractable migraine, prediabetes, depression, insomnia, anxiety, back  pain, significant psychiatric stressors.. She has significant depression and anxiety and I have recommended psychiatric follow up in the past. She has been seen at [facility] for unilateral symptoms in the past by our stroke team and exam was consistent with inorganic causes possibly stress. My exams have also not been onsistent with organic cause, showing poor effort and give way on the right, splitting exactly midline with vibration and pinprick.   She has significant life stressors. Today she appears with acute onset tremor and weakness. Per Dr. Lorelei Pont, Over the course of the last few months she is seen multiple physicians including going to the hospital, going to the ER multiple times, and been admitted with a cardiac catheterization(normal).  She has gone from walking unassisted without any assistive devices to now walking with a walker with great difficulty and having significant difficulties with even basic strength in her extremities.   She has been having more weakness. Legs feel like they are rubber and weigh 100 pounds. Weak in the legs. The tremors are not there all the time. Tremors are when watching TV. And when smoking a cigarette she drops it. She says she has a tremor, no tremor today at rest, it is distractable. She has been to multiple doctors including tcardiac and pulmonology and nothing is wrong. Started in early august and getting worse. She has numbness and tingling in the feet which is new. Weakness in the legs and the arms. The right side is still worse. No diplopia, no facial weakness. Headaches are better.    Initial HPI 05/10/2016:  Ashley [name] is a 54 y.o. female here as a referral from Dr. Deborra Medina for once visit. Past medical history of intractable migraine, prediabetes, depression, insomnia, anxiety, back pain, significant psychiatric stressors. She was diagnosed with cluster headaches by Dr. Delice Lesch and is being treated with vearapamil, she also has migraines.  She sees Dr. Delice Lesch and  she will be going back, Dr. Delice Lesch is on maternity leave at this time. She is here for evaluation of TIA over she was seen in the emergency room. Exam was consistent with nonorganic features in the right upper and lower extremities with splitting of vibration at the forehead. MRI of the brain was negative and she was sent home. Patient is here with her husband today who provides information as well. Patient says she ate a biscuit and then she started feelig weird and "numby and heavy" and the whole right side started feeling tingling. She still has the symptoms. She was seen in the emergency room, she was given shots. She was having headaches before this happened. No head trauma, symptoms started on her couch about 11am acute onset. She has continued tingly feelings on the right side, she says the symptoms are exactly just on the right side of her body, she says if you drqw a line directly down the middle of her body fron the face to the chest abdomen and pelvis exactly down the middle the whole right side doesn't feel right. She has a daily continuous headache since this episode. She had started having a very bad headache before the  right-sided symptoms. She is getting confused. She says she is having memory problems as well. Daily continuous headaches for years,  +phono/photophobiua, +nausea.. The headaches on the right side, throbbing and stabbing, she has light sensitivity, sounds sensitivity, nausea, vomiting. She has to sit in a dark room. It is continuous. She has 30 headaches out of month continuous, 20 or more are migrainous, can be as bad as a 10/10 and on average it is 6/10. No aura. No medication overuse. Denies lacrimation, injection, other signs or symptoms of cluster at this time. The right-sided symptoms occur with the migraines.   Reviewed notes, labs and imaging from outside physicians, which showed:   Reviewed notes from patient's emergency room visit that she is here to evaluate for: She  was assessed by Dr. Leonie Man personally in the emergency room. Motor system exam revealed poor effort variable drift with nonorganic features in the right upper and lower extremities. Patient split midline with vibration. NH stroke scale was 3. TPA was not given. MRI of the brain was negative for acute event as was CT of the head. She was given Toradol IV and Reglan to treat her headaches. She was sent home and not admitted.  Patient also been evaluated by Dr. Delice Lesch at [facility] neurology in 2016 for worsening headaches with memory changes and paresthesias. 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  Medications tried and failed: Verapamil, amitriptyline, Topamax, reglan, zofran, Relpax, Imitrex, a course of steroids, could not obtain triptan nasal spray due to insurance issues.   MRI of the brain 04/27/2016 (personally reviewed imaging and agree with the following): No acute infarct or intracranial hemorrhage.  No intracranial mass lesion noted on this unenhanced exam.  No age advanced atrophy hydrocephalus.  Nonspecific posterior superior left calvarial lesion with fat intensity may represent small hemangioma without significant change from 2011.  Cervical medullary junction, pituitary region, pineal region and orbital structures unremarkable.  Major intracranial vascular structures are patent.  Partial opacification right mastoid air cells once again noted without obstructing lesion of the eustachian tube noted. Minimal mucosal thickening ethmoid sinus air cells and right sphenoid sinus.  IMPRESSION: No acute infarct.  Persistent mild opacification right mastoid air cells and minimal paranasal sinus mucosal thickening.  Urine rapid drug screen was negative, CBC showed elevated white blood cells 11.1,  CMP was normal except for elevated glucose at 155.   Review of Systems: Patient complains of symptoms per HPI as well as the following symptoms: Fatigue, blurred vision, ringing in ears, joint pain, confusion, headache, numbness, weakness, dizziness, insomnia, anxiety and decreased energy. Pertinent negatives per HPI. All others negative.   Social History   Socioeconomic History   Marital status: Married    Spouse name: Ashley [name]   Number of children: 6   Years of education: 12   Highest education level: Not on file  Occupational History   Occupation:      Comment: helps run a friends business  Tobacco Use   Smoking status: Current Every Day Smoker    Packs/day: 1.25    Years: 35.00    Pack years: 43.75    Types: Cigarettes   Smokeless tobacco: Never Used   Tobacco comment: 08-17-17- pt reports smokes "almost 2 packs a day"  Substance and Sexual Activity   Alcohol use: No  Alcohol/week: 0.0 standard drinks   Drug use: No   Sexual activity: Yes    Partners: Male    Birth control/protection: Surgical  Other Topics Concern   Not on file  Social History Narrative   5 living children, one child is a crack cocaine addict who often breaks into their house to steal money. He is currently in jail. One child died of cerebral palsy.   Caffeine use: Dr Malachi Bonds (3 per day)   2 cups coffee per day   Online student, currently not working   Social Determinants of Radio broadcast assistant Strain:    Difficulty of Paying Living Expenses: Not on file  Food Insecurity:    Worried About Charity fundraiser in the Last Year: Not on file   [employer] of Food in the Last Year: Not on file  Transportation Needs:    Film/video editor (Medical): Not on file   Lack of Transportation (Non-Medical): Not on file  Physical Activity:    Days of Exercise per Week: Not on file   Minutes of Exercise per Session: Not on file  Stress:    Feeling of Stress : Not on file    Social Connections:    Frequency of Communication with Friends and Family: Not on file   Frequency of Social Gatherings with Friends and Family: Not on file   Attends Religious Services: Not on file   Active Member of Clubs or Organizations: Not on file   Attends Archivist Meetings: Not on file   Marital Status: Not on file  Intimate Partner Violence:    Fear of Current or Ex-Partner: Not on file   Emotionally Abused: Not on file   Physically Abused: Not on file   Sexually Abused: Not on file    Family History  Problem Relation Age of Onset   Diabetes Mother    Hyperlipidemia Mother    Hypertension Mother    Anxiety disorder Mother    Depression Mother    Drug abuse Mother    Stroke Mother        x3   Prostate cancer Father    Alcohol abuse Father    Lung cancer Maternal Grandfather        smoked   Emphysema Maternal Grandfather        smoked   COPD Maternal Grandmother        never smoked    Dementia Maternal Grandmother    Thyroid cancer Paternal Aunt    Colon cancer Neg Hx    Rectal cancer Neg Hx    Stomach cancer Neg Hx    Migraines Neg Hx     Past Medical History:  Diagnosis Date   Anxiety    Bronchitis    uses inhaler if needed for bronchitis, lasted used -2014   Cataract    bilateral   Chronic headaches    migraines - in past, uses Phenergan for nausea    Deficiency anemia 12/30/2017   Diabetes mellitus without complication ([place])    taken off Metformin since 04/2014, HgbA1C - normal, will follow up with PCP- Dr. Deborra Medina, 07/2014   GERD (gastroesophageal reflux disease)    H/O exercise stress test 2011   done at [facility]- told that it was WNL, done due to pt. having panic attacks    History of blood transfusion 01/15/1966   at birth in [place], [place], unsure number of units   Poor dentition    very poor oral  health    SVD (spontaneous vaginal delivery)    x 3   Tobacco abuse     Past Surgical History:   Procedure Laterality Date   ABDOMINAL HYSTERECTOMY     APPENDECTOMY  1988   CARDIAC CATHETERIZATION N/A 08/17/2016   Procedure: Left Heart Cath and Coronary Angiography;  Surgeon: Burnell Blanks, MD;  Location: [facility];  Service: Cardiovascular;  Laterality: N/A;   CHOLECYSTECTOMY N/A 06/07/2014   Procedure: LAPAROSCOPIC CHOLECYSTECTOMY;  Surgeon: Ralene Ok, MD;  Location: [facility];  Service: General;  Laterality: N/A;   CHOLECYSTECTOMY  June 07 2014   COLONOSCOPY  02/16/2014   normal    ESOPHAGOGASTRODUODENOSCOPY  12/28/2013   KNEE ARTHROSCOPY  1995   left   LAPAROSCOPIC ASSISTED VAGINAL HYSTERECTOMY N/A 04/15/2014   Procedure: LAPAROSCOPIC ASSISTED VAGINAL HYSTERECTOMY;  Surgeon: Marylynn Pearson, MD;  Location: [facility];  Service: Gynecology;  Laterality: N/A;   LAPAROSCOPIC BILATERAL SALPINGO OOPHERECTOMY Bilateral 04/15/2014   Procedure: LAPAROSCOPIC BILATERAL SALPINGO OOPHORECTOMY;  Surgeon: Marylynn Pearson, MD;  Location: [facility];  Service: Gynecology;  Laterality: Bilateral;   TUBAL LIGATION      Current Outpatient Medications  Medication Sig Dispense Refill   albuterol (VENTOLIN HFA) 108 (90 Base) MCG/ACT inhaler INHALE 2 PUFFS INTO THE LUNGS EVERY 6 HOURS AS NEEDED FOR WHEEZING OR SHORTNESS OF BREATH 6.7 g 3   AQUALANCE LANCETS 30G MISC   98   aspirin EC 81 MG tablet Take 1 tablet (81 mg total) by mouth daily. (Patient taking differently: Take 81 mg by mouth at bedtime. ) 90 tablet 1   BAYER CONTOUR TEST test strip Testing for up to tid 100 each 98   cholecalciferol (VITAMIN D3) 25 MCG (1000 UT) tablet Take 2,000 Units by mouth daily.     clonazePAM (KLONOPIN) 1 MG tablet Take 1 tablet (1 mg total) by mouth 2 (two) times daily. 60 tablet 3   dicyclomine (BENTYL) 10 MG capsule Take 1 capsule (10 mg total) by mouth 2 (two) times daily as needed (Cramping and abdominal pain). 180 capsule 1   docusate sodium (COLACE) 100 MG capsule Take 100 mg by  mouth at bedtime.     estradiol (CLIMARA - DOSED IN MG/24 HR) 0.0375 mg/24hr patch Place 1 patch (0.0375 mg total) onto the skin once a week. 12 patch 0   etodolac (LODINE) 400 MG tablet Take 1 tablet (400 mg total) by mouth 2 (two) times daily as needed (for back pain). 60 tablet 5   ferrous sulfate 325 (65 FE) MG tablet Take 1 tablet (325 mg total) by mouth 2 (two) times daily with a meal. 180 tablet 1   fludrocortisone (FLORINEF) 0.1 MG tablet Take 1 tablet (0.1 mg total) by mouth daily. 90 tablet 0   Fremanezumab-vfrm ([name]) 225 MG/1.5ML SOSY Inject 225 mg into the skin every 30 (thirty) days. 1.5 mL 11   insulin detemir (LEVEMIR) 100 UNIT/ML injection Inject 0.3 mLs (30 Units total) into the skin daily. 10 mL 1   Insulin Pen Needle 32G X 6 MM MISC Use daily as directed 100 each 1   Lancet Devices (ADJUSTABLE LANCING DEVICE) MISC Up to tid testing  98   nitroGLYCERIN (NITROSTAT) 0.4 MG SL tablet Place 1 tablet (0.4 mg total) under the tongue every 5 (five) minutes x 3 doses as needed for chest pain. 30 tablet 5   pantoprazole (PROTONIX) 40 MG tablet Take 1 tablet (40 mg total) by mouth daily. 90 tablet 1  rosuvastatin (CRESTOR) 20 MG tablet TAKE 1 TABLET BY MOUTH EVERY DAY, NEED OFFICE VISIT FOR MORE REFILLS 90 tablet 1   traZODone (DESYREL) 150 MG tablet Take 1 tablet (150 mg total) by mouth at bedtime. 90 tablet 1   TRULICITY A999333 0000000 SOPN INJECT ONCE WEEKLY AS DIRECTED 2 mL 5   BOTOX 200 units SOLR INJECT 155 UNITS IN THE MUSCLE INTO HEAD AND NECK MUSCLES EVERY 3 MONTHS BY PROVIDER (Patient not taking: Reported on 01/19/2020) 1 each 0   Rimegepant Sulfate (NURTEC) 75 MG TBDP Take 75 mg by mouth daily as needed. For migraines. Take as close to onset of migraine as possible. One daily maximum. 10 tablet 6   venlafaxine XR (EFFEXOR-XR) 150 MG 24 hr capsule Take 1 capsule (150 mg total) by mouth daily with breakfast. 90 capsule 3   No current facility-administered  medications for this visit.    Allergies as of 01/19/2020 - Review Complete 01/19/2020  Allergen Reaction Noted   Penicillins Anaphylaxis and Swelling    Topamax [topiramate] Other (See Comments) 02/12/2014   Metformin and related  07/28/2019    Vitals: BP 136/64 (BP Location: Right Arm, Patient Position: Sitting)    Pulse 96    Temp (!) 97.1 F (36.2 C) Comment: taken at front   Ht 5\' 2"  (1.575 m)    Wt 165 lb (74.8 kg)    LMP 02/12/2014    BMI 30.18 kg/m  Last Weight:  Wt Readings from Last 1 Encounters:  01/19/20 165 lb (74.8 kg)   Last Height:   Ht Readings from Last 1 Encounters:  01/19/20 5\' 2"  (1.575 m)       Physical exam: Exam: Gen: NAD                   CV: RRR, no MRG. No Carotid Bruits. No peripheral edema, warm, nontender Eyes: Conjunctivae clear without exudates or hemorrhage  Neuro: Detailed Neurologic Exam  Speech:    Speech is normal; fluent and spontaneous with normal comprehension.  Cognition:    The patient is oriented to person, place, and time;     Cranial Nerves:    The pupils are equal, round, and reactive to light.  Visual fields are full to finger confrontation. Extraocular movements are intact. Trigeminal sensation is intact and the muscles of mastication are normal. Left lid ptosis(chronic), otherwise the  face is symmetric, not fatiguable. The palate elevates in the midline. Hearing intact. Voice is normal. Shoulder shrug is normal. The tongue has normal motion without fasciculations.   Coordination:    Normal finger to nose and heel to shin.  Gait:   Patient has a slightly stooped posture and antalgic gait, slightly wide-based.  Cannot tandem due to imbalance.  Motor Observation:    No asymmetry, no atrophy, and no involuntary movements noted. No tremor on exam.  Tone:    Normal muscle tone.    Posture:    Posture is slightly stooped    Strength: giveway and  poor effort but overall appears to have 4/5 strength if you  encourage her and ask her to give all her effort her strength improves.  However cannot rule out proximal weakness in this patient.     Sensation: splits midline to pin prick and vibration     Reflex Exam:  DTR's:    Deep tendon reflexes in the upper and lower extremities are brisk bilaterally.   Toes:    The toes are equivocal bilaterally.   Clonus:  Clonus is absent.       Assessment/Plan:  54 y.o. female here as a follow up.  She has chronic migraines and was doing well on Botox we will need to reinitiate, Past medical history of intractable migraine, prediabetes, depression, insomnia, anxiety, back pain, significant psychiatric stressors.. She has significant depression and anxiety and I have recommended psychiatric follow up in the past. She has been seen at [facility] for unilateral symptoms in the past by our stroke team and exam was consistent with nonorganic causes possibly stress. My exams have also not been consistent with non-organic cause, showing poor effort and give way on the right, splitting exactly midline with vibration and pinprick, appears to have significant psychiatric contribution to her symptoms. with a lot of embellishment.  - She has done well as far as her migraines are concerned on Botox which has been delayed due to coronavirus and due to insurance problems and appears that last injection was over 6 months ago and migraines chronic again, will restart botox she had exceptional improvement and even better combined with ajovy  - Increase Venlafaxine for migraines  - Retry approval for botox  - Will try Nurtec has failed >3 triptans (maxalt, imitrex, relpax)  Meds ordered this encounter  Medications   venlafaxine XR (EFFEXOR-XR) 150 MG 24 hr capsule    Sig: Take 1 capsule (150 mg total) by mouth daily with breakfast.    Dispense:  90 capsule    Refill:  3   Rimegepant Sulfate (NURTEC) 75 MG TBDP    Sig: Take 75 mg by mouth daily as needed. For  migraines. Take as close to onset of migraine as possible. One daily maximum.    Dispense:  10 tablet    Refill:  6    Patient has copay card; she can have medication regardless of insurance approval or copay amount.   A total of 30 minutes was spent on this patient's care, reviewing imaging, past records, recent hospitalization notes and results. Over half this time was spent on counseling patient on the  1. Chronic migraine without aura, with intractable migraine, so stated, with status migrainosus    diagnosis and different diagnostic and therapeutic options, counseling and coordination of care, risks and benefitsof management, compliance, or risk factor reduction and education.     PRIOR:  - Due to complaints of persistent memory loss which I do believe again partly due to life stressors, component of conversion disorder, multiple medical disorders and mood disorders and sedentary lifestyle, she was sent for formal memory testing.  Not consistent with patterns typically identified is seen with degenerative cortical dementia such as Alzheimer's.  Also noted a history of anxiety.  However given questionable vascular etiology to her memory loss we need CTA of the head and neck.  We will also decrease Amitriptyline by 1/2 pill each week until stopped which could be causing considerable memory loss, she has been on it for years prescribed by Dr. Ronnald Ramp of asked her to let this physician know that we will be stopping it. - restart Botox as above -  she has been complaining years of ataxia and falls, work-up to date including imaging of the brain, lumbar spine EMG nerve conduction study have been negative.  Need MRI of the cervical spine. -Highly recommend to continue with therapy and psychiatry for depression and anxiety which I feel is significantly contributing to her symptoms. Referral placed at last appointment. -For her migraine: Continue Botox for multiple failed medications.  -  C/o problems with  memory for years and MRi brain normal in 2018, do not see any need to repeat - Other meds that can affect her are clonazepam, trazodone - Discuss with Dr. Ronnald Ramp - Can continue to Venlafaxine for migranes - hgba1c 7.8, better control of her chronic conditions may also help  - fall precautions  PRIOR:   New onset weakness and gait distrubance and tremor (no tremor on exam today): Feel this may be functional or conversion disorder.  When she gets up independently she shuffles but if you hold her hands lightly  and walk very quickly up and down the hallway she can keep up with good stride, good turn and can keep up with a quick pace. Also no tremor on exam today.  Still need to rule out organic causes EMG/NCS, Labs today including CK, Myasthenia gravis antibodies.   A total of 40 minutes was spent face-to-face with this patient. Over half this time was spent on counseling patient on the  1. Chronic migraine without aura, with intractable migraine, so stated, with status migrainosus    diagnosis and different diagnostic and therapeutic options, counseling and coordination of care, risks ans benefits of management, compliance, or risk factor reduction and education.

## 2020-01-19 NOTE — Patient Instructions (Signed)
Increase Venlafaxine and once daily dosing Nurtec as needed for acute management Try to get botox approved Continue [name]  Rimegepant oral dissolving tablet What is this medicine? RIMEGEPANT (ri ME je pant) is used to treat migraine headaches with or without aura. An aura is a strange feeling or visual disturbance that warns you of an attack. It is not used to prevent migraines. This medicine may be used for other purposes; ask your health care provider or pharmacist if you have questions. COMMON BRAND NAME(S): NURTEC ODT What should I tell my health care provider before I take this medicine? They need to know if you have any of these conditions:  kidney disease  liver disease  an unusual or allergic reaction to rimegepant, other medicines, foods, dyes, or preservatives  pregnant or trying to get pregnant  breast-feeding How should I use this medicine? Take the medicine by mouth. Follow the directions on the prescription label. Leave the tablet in the sealed blister pack until you are ready to take it. With dry hands, open the blister and gently remove the tablet. If the tablet breaks or crumbles, throw it away and take a new tablet out of the blister pack. Place the tablet in the mouth and allow it to dissolve, and then swallow. Do not cut, crush, or chew this medicine. You do not need water to take this medicine. Talk to your pediatrician about the use of this medicine in children. Special care may be needed. Overdosage: If you think you have taken too much of this medicine contact a poison control center or emergency room at once. NOTE: This medicine is only for you. Do not share this medicine with others. What if I miss a dose? This does not apply. This medicine is not for regular use. What may interact with this medicine? This medicine may interact with the following medications:  certain medicines for fungal infections like fluconazole, itraconazole  rifampin This list may  not describe all possible interactions. Give your health care provider a list of all the medicines, herbs, non-prescription drugs, or dietary supplements you use. Also tell them if you smoke, drink alcohol, or use illegal drugs. Some items may interact with your medicine. What should I watch for while using this medicine? Visit your health care professional for regular checks on your progress. Tell your health care professional if your symptoms do not start to get better or if they get worse. What side effects may I notice from receiving this medicine? Side effects that you should report to your doctor or health care professional as soon as possible:  allergic reactions like skin rash, itching or hives; swelling of the face, lips, or tongue Side effects that usually do not require medical attention (report these to your doctor or health care professional if they continue or are bothersome):  nausea This list may not describe all possible side effects. Call your doctor for medical advice about side effects. You may report side effects to FDA at 1-800-FDA-1088. Where should I keep my medicine? Keep out of the reach of children. Store at room temperature between 15 and 30 degrees C (59 and 86 degrees F). Throw away any unused medicine after the expiration date. NOTE: This sheet is a summary. It may not cover all possible information. If you have questions about this medicine, talk to your doctor, pharmacist, or health care provider.  2020 Elsevier/Gold Standard (2019-02-23 00:21:31) Venlafaxine extended-release capsules What is this medicine? VENLAFAXINE(VEN la fax een) is used to treat  depression, anxiety and panic disorder. This medicine may be used for other purposes; ask your health care provider or pharmacist if you have questions. COMMON BRAND NAME(S): Effexor XR What should I tell my health care provider before I take this medicine? They need to know if you have any of these  conditions:  bleeding disorders  glaucoma  heart disease  high blood pressure  high cholesterol  kidney disease  liver disease  low levels of sodium in the blood  mania or bipolar disorder  seizures  suicidal thoughts, plans, or attempt; a previous suicide attempt by you or a family  take medicines that treat or prevent blood clots  thyroid disease  an unusual or allergic reaction to venlafaxine, desvenlafaxine, other medicines, foods, dyes, or preservatives  pregnant or trying to get pregnant  breast-feeding How should I use this medicine? Take this medicine by mouth with a full glass of water. Follow the directions on the prescription label. Do not cut, crush, or chew this medicine. Take it with food. If needed, the capsule may be carefully opened and the entire contents sprinkled on a spoonful of cool applesauce. Swallow the applesauce/pellet mixture right away without chewing and follow with a glass of water to ensure complete swallowing of the pellets. Try to take your medicine at about the same time each day. Do not take your medicine more often than directed. Do not stop taking this medicine suddenly except upon the advice of your doctor. Stopping this medicine too quickly may cause serious side effects or your condition may worsen. A special MedGuide will be given to you by the pharmacist with each prescription and refill. Be sure to read this information carefully each time. Talk to your pediatrician regarding the use of this medicine in children. Special care may be needed. Overdosage: If you think you have taken too much of this medicine contact a poison control center or emergency room at once. NOTE: This medicine is only for you. Do not share this medicine with others. What if I miss a dose? If you miss a dose, take it as soon as you can. If it is almost time for your next dose, take only that dose. Do not take double or extra doses. What may interact with this  medicine? Do not take this medicine with any of the following medications:  certain medicines for fungal infections like fluconazole, itraconazole, ketoconazole, posaconazole, voriconazole  cisapride  desvenlafaxine  dronedarone  duloxetine  levomilnacipran  linezolid  MAOIs like Carbex, Eldepryl, Marplan, Nardil, and Parnate  methylene blue (injected into a vein)  milnacipran  pimozide  thioridazine This medicine may also interact with the following medications:  amphetamines  aspirin and aspirin-like medicines  certain medicines for depression, anxiety, or psychotic disturbances  certain medicines for migraine headaches like almotriptan, eletriptan, frovatriptan, naratriptan, rizatriptan, sumatriptan, zolmitriptan  certain medicines for sleep  certain medicines that treat or prevent blood clots like dalteparin, enoxaparin, warfarin  cimetidine  clozapine  diuretics  fentanyl  furazolidone  indinavir  isoniazid  lithium  metoprolol  NSAIDS, medicines for pain and inflammation, like ibuprofen or naproxen  other medicines that prolong the QT interval (cause an abnormal heart rhythm) like dofetilide, ziprasidone  procarbazine  rasagiline  supplements like St. John's wort, kava kava, valerian  tramadol  tryptophan This list may not describe all possible interactions. Give your health care provider a list of all the medicines, herbs, non-prescription drugs, or dietary supplements you use. Also tell them if you smoke,  drink alcohol, or use illegal drugs. Some items may interact with your medicine. What should I watch for while using this medicine? Tell your doctor if your symptoms do not get better or if they get worse. Visit your doctor or health care professional for regular checks on your progress. Because it may take several weeks to see the full effects of this medicine, it is important to continue your treatment as prescribed by your  doctor. Patients and their families should watch out for new or worsening thoughts of suicide or depression. Also watch out for sudden changes in feelings such as feeling anxious, agitated, panicky, irritable, hostile, aggressive, impulsive, severely restless, overly excited and hyperactive, or not being able to sleep. If this happens, especially at the beginning of treatment or after a change in dose, call your health care professional. This medicine can cause an increase in blood pressure. Check with your doctor for instructions on monitoring your blood pressure while taking this medicine. You may get drowsy or dizzy. Do not drive, use machinery, or do anything that needs mental alertness until you know how this medicine affects you. Do not stand or sit up quickly, especially if you are an older patient. This reduces the risk of dizzy or fainting spells. Alcohol may interfere with the effect of this medicine. Avoid alcoholic drinks. Your mouth may get dry. Chewing sugarless gum, sucking hard candy and drinking plenty of water will help. Contact your doctor if the problem does not go away or is severe. What side effects may I notice from receiving this medicine? Side effects that you should report to your doctor or health care professional as soon as possible:  allergic reactions like skin rash, itching or hives, swelling of the face, lips, or tongue  anxious  breathing problems  confusion  changes in vision  chest pain  confusion  elevated mood, decreased need for sleep, racing thoughts, impulsive behavior  eye pain  fast, irregular heartbeat  feeling faint or lightheaded, falls  feeling agitated, angry, or irritable  hallucination, loss of contact with reality  high blood pressure  loss of balance or coordination  palpitations  redness, blistering, peeling or loosening of the skin, including inside the mouth  restlessness, pacing, inability to keep  still  seizures  stiff muscles  suicidal thoughts or other mood changes  trouble passing urine or change in the amount of urine  trouble sleeping  unusual bleeding or bruising  unusually weak or tired  vomiting Side effects that usually do not require medical attention (report to your doctor or health care professional if they continue or are bothersome):  change in sex drive or performance  change in appetite or weight  constipation  dizziness  dry mouth  headache  increased sweating  nausea  tired This list may not describe all possible side effects. Call your doctor for medical advice about side effects. You may report side effects to FDA at 1-800-FDA-1088. Where should I keep my medicine? Keep out of the reach of children. Store at a controlled temperature between 20 and 25 degrees C (68 degrees and 77 degrees F), in a dry place. Throw away any unused medicine after the expiration date. NOTE: This sheet is a summary. It may not cover all possible information. If you have questions about this medicine, talk to your doctor, pharmacist, or health care provider.  2020 Elsevier/Gold Standard (2018-12-02 12:06:43)

## 2020-01-20 ENCOUNTER — Telehealth: Payer: Self-pay | Admitting: Neurology

## 2020-01-20 ENCOUNTER — Telehealth: Payer: Self-pay | Admitting: *Deleted

## 2020-01-20 NOTE — Telephone Encounter (Signed)
Pt called stating that she has a sever headache and is wanting to be advised.

## 2020-01-20 NOTE — Telephone Encounter (Signed)
I called the pt back and LVM (ok per DPR) letting pt know I was returning her call about her severe headache. I advised that the Nurtec PA was completed and even if insurance doesn't cover it, she can still use the savings card to get this medication. I encouraged the pt to go ahead and call her pharmacy today to get this filled. I advised if this is a severe headache that is not like her typical migraines or headaches we recommend she go to the hospital and if she is having any stroke-like symptoms, for example weakness, numbness, speech problems, vision problems, call 911. I advised pt she can call back and speak with on-call MD if needed. Patient should limit OTC med to no more than 2-3 times per week. Left office number in message for call back.

## 2020-01-20 NOTE — Telephone Encounter (Signed)
Completed Nurtec PA on [facility]. KEY: [id number]. Awaiting [facility] determination. Requested urgent review.

## 2020-01-21 ENCOUNTER — Encounter: Payer: Self-pay | Admitting: *Deleted

## 2020-01-21 NOTE — Telephone Encounter (Signed)
Received approval from [place]. Nurtec approved 01/20/20-01/19/21. I faxed the notice to the pharmacy. Received a receipt of confirmation. Also messaged pt in [name] with update.

## 2020-01-22 ENCOUNTER — Other Ambulatory Visit: Payer: Self-pay | Admitting: Internal Medicine

## 2020-01-22 DIAGNOSIS — F411 Generalized anxiety disorder: Secondary | ICD-10-CM

## 2020-01-25 ENCOUNTER — Telehealth: Payer: Self-pay

## 2020-01-25 ENCOUNTER — Other Ambulatory Visit: Payer: Self-pay | Admitting: Internal Medicine

## 2020-01-25 DIAGNOSIS — K2100 Gastro-esophageal reflux disease with esophagitis, without bleeding: Secondary | ICD-10-CM

## 2020-01-25 NOTE — Telephone Encounter (Signed)
Patient called back in regards to missed call please follow up

## 2020-01-25 NOTE — Telephone Encounter (Signed)
I called to schedule the patient and she did not answer so I left her a VM asking her to call back. DWD

## 2020-01-27 NOTE — Telephone Encounter (Signed)
I called and scheduled the patient. DWD  

## 2020-01-28 ENCOUNTER — Other Ambulatory Visit (INDEPENDENT_AMBULATORY_CARE_PROVIDER_SITE_OTHER): Payer: Self-pay | Admitting: Family Medicine

## 2020-01-30 ENCOUNTER — Other Ambulatory Visit: Payer: Self-pay | Admitting: Internal Medicine

## 2020-01-30 DIAGNOSIS — N951 Menopausal and female climacteric states: Secondary | ICD-10-CM

## 2020-02-02 ENCOUNTER — Telehealth: Payer: Self-pay | Admitting: *Deleted

## 2020-02-02 NOTE — Telephone Encounter (Signed)
[name] PA completed, signed, and faxed to [facility]. Received a receipt of confirmation.

## 2020-02-03 ENCOUNTER — Encounter: Payer: Self-pay | Admitting: *Deleted

## 2020-02-03 NOTE — Telephone Encounter (Signed)
Per [place] approved from 02/02/20-02/01/21. I faxed approval notice to pt's pharmacy. Received a receipt of confirmation. Pt notified via mychart.

## 2020-02-08 ENCOUNTER — Other Ambulatory Visit: Payer: Self-pay | Admitting: Family

## 2020-02-08 ENCOUNTER — Ambulatory Visit
Admission: RE | Admit: 2020-02-08 | Discharge: 2020-02-08 | Disposition: A | Discharge status: Home / Self Care | Payer: 59 | Source: Ambulatory Visit | Attending: Family | Admitting: Family

## 2020-02-08 ENCOUNTER — Other Ambulatory Visit: Payer: Self-pay

## 2020-02-08 DIAGNOSIS — N6489 Other specified disorders of breast: Secondary | ICD-10-CM

## 2020-02-08 DIAGNOSIS — N644 Mastodynia: Secondary | ICD-10-CM

## 2020-02-09 LAB — HM MAMMOGRAPHY

## 2020-02-10 ENCOUNTER — Ambulatory Visit
Admission: RE | Admit: 2020-02-10 | Discharge: 2020-02-10 | Disposition: A | Discharge status: Home / Self Care | Payer: 59 | Source: Ambulatory Visit | Attending: Family | Admitting: Family

## 2020-02-10 ENCOUNTER — Other Ambulatory Visit: Payer: Self-pay

## 2020-02-10 DIAGNOSIS — N6489 Other specified disorders of breast: Secondary | ICD-10-CM

## 2020-02-11 ENCOUNTER — Telehealth: Payer: Self-pay

## 2020-02-11 MED ORDER — BOTOX 200 UNITS IJ SOLR: INTRAMUSCULAR | 0 refills | Status: DC

## 2020-02-11 NOTE — Telephone Encounter (Signed)
botox refill sent to [facility] in [facility]

## 2020-02-11 NOTE — Telephone Encounter (Signed)
Bethany could you send script for Botox to [facility]?

## 2020-02-15 ENCOUNTER — Telehealth: Payer: Self-pay

## 2020-02-15 NOTE — Telephone Encounter (Signed)
I called and completed PA for Botox, it was approved for a year, we are pending the approval fax. DWD

## 2020-02-16 ENCOUNTER — Ambulatory Visit: Payer: 59 | Admitting: Neurology

## 2020-02-16 NOTE — Telephone Encounter (Signed)
Noted, thank you

## 2020-02-22 ENCOUNTER — Other Ambulatory Visit: Payer: Self-pay | Admitting: Surgery

## 2020-02-22 DIAGNOSIS — N6092 Unspecified benign mammary dysplasia of left breast: Secondary | ICD-10-CM

## 2020-02-24 NOTE — Telephone Encounter (Signed)
I spoke with the patient and offered a botox appt with Amy NP on 3/10 @ 1:00 pm. Pt accepted. She verbalized appreciation.

## 2020-02-24 NOTE — Telephone Encounter (Addendum)
[facility] called and scheduled delivery for 03/02/20, patient will need an apt.

## 2020-02-25 NOTE — Telephone Encounter (Signed)
Noted, thank you

## 2020-02-29 ENCOUNTER — Other Ambulatory Visit: Payer: Self-pay | Admitting: Internal Medicine

## 2020-02-29 DIAGNOSIS — R918 Other nonspecific abnormal finding of lung field: Secondary | ICD-10-CM

## 2020-03-01 ENCOUNTER — Other Ambulatory Visit: Payer: Self-pay | Admitting: Surgery

## 2020-03-01 DIAGNOSIS — N6092 Unspecified benign mammary dysplasia of left breast: Secondary | ICD-10-CM

## 2020-03-02 ENCOUNTER — Other Ambulatory Visit: Payer: Self-pay | Admitting: Internal Medicine

## 2020-03-02 ENCOUNTER — Ambulatory Visit: Payer: Self-pay | Admitting: Family Medicine

## 2020-03-02 DIAGNOSIS — F411 Generalized anxiety disorder: Secondary | ICD-10-CM

## 2020-03-08 ENCOUNTER — Other Ambulatory Visit: Payer: Self-pay

## 2020-03-08 ENCOUNTER — Encounter: Payer: Self-pay | Admitting: Student

## 2020-03-08 ENCOUNTER — Ambulatory Visit: Payer: 59 | Admitting: Student

## 2020-03-08 VITALS — BP 138/84 | HR 57 | Ht 62.0 in | Wt 157.0 lb

## 2020-03-08 DIAGNOSIS — E7800 Pure hypercholesterolemia, unspecified: Secondary | ICD-10-CM

## 2020-03-08 DIAGNOSIS — I10 Essential (primary) hypertension: Secondary | ICD-10-CM

## 2020-03-08 DIAGNOSIS — R0789 Other chest pain: Secondary | ICD-10-CM | POA: Diagnosis not present

## 2020-03-08 DIAGNOSIS — I959 Hypotension, unspecified: Secondary | ICD-10-CM

## 2020-03-08 DIAGNOSIS — Z720 Tobacco use: Secondary | ICD-10-CM | POA: Diagnosis not present

## 2020-03-08 DIAGNOSIS — F418 Other specified anxiety disorders: Secondary | ICD-10-CM

## 2020-03-08 NOTE — Progress Notes (Signed)
PCP:  Janith Lima, MD Primary Cardiologist: Dr. Sallyanne Kuster (seen during admission 01/2019) Electrophysiologist: She saw Dr. Caryl Comes 07/2016 on what appears to be a DOD day.   Ashley [name] is a 54 y.o. female with past medical history of HTN, DM2, Anxiety, tobacco abuse, tremors, conversion disorder, and COPD, who presents today for routine followup. They are seen for Dr. Caryl Comes.     Seen last by Jory Sims 07/27/2019 for multiple complaints including chronic MSK pain, anxiety, and labile HTN.  She is not sure why she is here today (in for 1 year recall) She thought it was to discuss/repeat her CTA for carotid stenosis. Overall she feels about the same. She continues to smoke. She has mild SOB at baseline with mild to moderate exertion. No exertional chest pain. Continues to have chronic atypical chest and musculoskeletal pain. No syncope.  The patient feels that she is tolerating medications without difficulties and is otherwise without complaint today.   Past Medical History:  Diagnosis Date  . Anxiety   . Bronchitis    uses inhaler if needed for bronchitis, lasted used -2014  . Cataract    bilateral  . Chronic headaches    migraines - in past, uses Phenergan for nausea   . Deficiency anemia 12/30/2017  . Diabetes mellitus without complication ([place])    taken off Metformin since 04/2014, HgbA1C - normal, will follow up with PCP- Dr. Deborra Medina, 07/2014  . GERD (gastroesophageal reflux disease)   . H/O exercise stress test 2011   done at [facility]- told that it was WNL, done due to pt. having panic attacks   . History of blood transfusion 04/26/1966   at birth in [place], [place], unsure number of units  . Poor dentition    very poor oral health   . SVD (spontaneous vaginal delivery)    x 3  . Tobacco abuse    Past Surgical History:  Procedure Laterality Date  . ABDOMINAL HYSTERECTOMY    . APPENDECTOMY  1988  . CARDIAC CATHETERIZATION N/A 08/17/2016   Procedure: Left Heart Cath and Coronary  Angiography;  Surgeon: Burnell Blanks, MD;  Location: [facility];  Service: Cardiovascular;  Laterality: N/A;  . CHOLECYSTECTOMY N/A 06/07/2014   Procedure: LAPAROSCOPIC CHOLECYSTECTOMY;  Surgeon: Ralene Ok, MD;  Location: [facility];  Service: General;  Laterality: N/A;  . CHOLECYSTECTOMY  June 07 2014  . COLONOSCOPY  02/16/2014   normal   . ESOPHAGOGASTRODUODENOSCOPY  12/28/2013  . KNEE ARTHROSCOPY  1995   left  . LAPAROSCOPIC ASSISTED VAGINAL HYSTERECTOMY N/A 04/15/2014   Procedure: LAPAROSCOPIC ASSISTED VAGINAL HYSTERECTOMY;  Surgeon: Marylynn Pearson, MD;  Location: [facility];  Service: Gynecology;  Laterality: N/A;  . LAPAROSCOPIC BILATERAL SALPINGO OOPHERECTOMY Bilateral 04/15/2014   Procedure: LAPAROSCOPIC BILATERAL SALPINGO OOPHORECTOMY;  Surgeon: Marylynn Pearson, MD;  Location: [facility];  Service: Gynecology;  Laterality: Bilateral;  . TUBAL LIGATION      Current Outpatient Medications  Medication Sig Dispense Refill  . albuterol (VENTOLIN HFA) 108 (90 Base) MCG/ACT inhaler INHALE 2 PUFFS INTO THE LUNGS EVERY 6 HOURS AS NEEDED FOR WHEEZING OR SHORTNESS OF BREATH (Patient taking differently: Inhale 2 puffs into the lungs every 6 (six) hours as needed for wheezing or shortness of breath. ) 6.7 g 3  . AQUALANCE LANCETS 30G MISC   98  . aspirin EC 81 MG tablet Take 1 tablet (81 mg total) by mouth daily. (Patient taking differently: Take 81 mg by mouth at bedtime. )  90 tablet 1  . BAYER CONTOUR TEST test strip Testing for up to tid 100 each 98  . Botulinum Toxin Type A (BOTOX) 200 units SOLR INJECT 155 UNITS IN THE MUSCLE INTO HEAD AND NECK MUSCLES EVERY 3 MONTHS BY PROVIDER. DISCARD REMAINDER. (Patient taking differently: Inject 155 Units as directed See admin instructions. IN THE MUSCLE INTO HEAD AND NECK MUSCLES EVERY 3 MONTHS BY PROVIDER. DISCARD REMAINDER.) 1 each 0  . cholecalciferol (VITAMIN D3) 25 MCG (1000 UT) tablet Take 2,000 Units by mouth daily.    . clonazePAM  (KLONOPIN) 1 MG tablet TAKE 1 TABLET(1 MG) BY MOUTH TWICE DAILY (Patient taking differently: Take 1 mg by mouth 2 (two) times daily. ) 60 tablet 1  . dicyclomine (BENTYL) 10 MG capsule Take 1 capsule (10 mg total) by mouth 2 (two) times daily as needed (Cramping and abdominal pain). (Patient taking differently: Take 10 mg by mouth in the morning and at bedtime. ) 180 capsule 1  . estradiol (CLIMARA - DOSED IN MG/24 HR) 0.0375 mg/24hr patch APPLY 1 PATCH(0.038 MG) EXTERNALLY TO THE SKIN 1 TIME A WEEK (Patient taking differently: Place 0.0375 mg onto the skin once a week. ) 12 patch 0  . etodolac (LODINE) 400 MG tablet TAKE 1 TABLET(400 MG) BY MOUTH TWICE DAILY AS NEEDED FOR BACK PAIN (Patient taking differently: Take 400 mg by mouth 2 (two) times daily as needed for mild pain (Back Pain). ) 60 tablet 5  . ferrous sulfate 325 (65 FE) MG tablet Take 1 tablet (325 mg total) by mouth 2 (two) times daily with a meal. (Patient taking differently: Take 325 mg by mouth in the morning and at bedtime. ) 180 tablet 1  . fludrocortisone (FLORINEF) 0.1 MG tablet TAKE 1 TABLET(0.1 MG) BY MOUTH DAILY (Patient taking differently: Take 0.1 mg by mouth daily. ) 90 tablet 0  . Fremanezumab-vfrm ([name]) 225 MG/1.5ML SOSY Inject 225 mg into the skin every 30 (thirty) days. 1.5 mL 11  . insulin detemir (LEVEMIR) 100 UNIT/ML injection Inject 0.3 mLs (30 Units total) into the skin daily. (Patient taking differently: Inject 35 Units into the skin at bedtime. ) 10 mL 1  . Insulin Pen Needle 32G X 6 MM MISC Use daily as directed 100 each 1  . Lancet Devices (ADJUSTABLE LANCING DEVICE) MISC Up to tid testing  98  . nitroGLYCERIN (NITROSTAT) 0.4 MG SL tablet Place 1 tablet (0.4 mg total) under the tongue every 5 (five) minutes x 3 doses as needed for chest pain. 30 tablet 5  . pantoprazole (PROTONIX) 40 MG tablet TAKE 1 TABLET(40 MG) BY MOUTH DAILY (Patient taking differently: Take 40 mg by mouth daily. ) 90 tablet 1  . promethazine  (PHENERGAN) 25 MG tablet Take 25 mg by mouth every 6 (six) hours as needed for nausea or vomiting.    . Rimegepant Sulfate (NURTEC) 75 MG TBDP Take 75 mg by mouth daily as needed. For migraines. Take as close to onset of migraine as possible. One daily maximum. (Patient taking differently: Take 75 mg by mouth daily as needed (For migraines). For migraines. Take as close to onset of migraine as possible. One daily maximum.) 10 tablet 6  . rosuvastatin (CRESTOR) 20 MG tablet TAKE 1 TABLET BY MOUTH EVERY DAY, NEED OFFICE VISIT FOR MORE REFILLS (Patient taking differently: Take 20 mg by mouth daily. ) 90 tablet 1  . traZODone (DESYREL) 150 MG tablet TAKE 1 TABLET(150 MG) BY MOUTH AT BEDTIME (Patient taking differently: Take  150 mg by mouth at bedtime. ) 90 tablet 1  . TRULICITY [id number] [id number] SOPN INJECT ONCE WEEKLY AS DIRECTED (Patient taking differently: Inject 75 mg into the skin once a week. Monday) 2 mL 5  . venlafaxine XR (EFFEXOR-XR) 150 MG 24 hr capsule Take 1 capsule (150 mg total) by mouth daily with breakfast. (Patient taking differently: Take 150 mg by mouth daily. ) 90 capsule 3   No current facility-administered medications for this visit.    Allergies  Allergen Reactions  . Penicillins Anaphylaxis and Swelling    Throat swells Has patient had a PCN reaction causing immediate rash, facial/tongue/throat swelling, SOB or lightheadedness with hypotension: Yes Has patient had a PCN reaction causing severe rash involving mucus membranes or skin necrosis: No Has patient had a PCN reaction that required hospitalization: No Has patient had a PCN reaction occurring within the last 10 years: No If all of the above answers are "NO", then may proceed with Cephalosporin use.   . Topamax [Topiramate] Other (See Comments)    Hands and feet  go numb  . Metformin And Related     GI side effects     Social History   Socioeconomic History  . Marital status: Married    Spouse name: Alvester Chou  .  Number of children: 6  . Years of education: 22  . Highest education level: Not on file  Occupational History  . Occupation:      Comment: helps run a friends business  Tobacco Use  . Smoking status: Current Every Day Smoker    Packs/day: 1.25    Years: 35.00    Pack years: 43.75    Types: Cigarettes  . Smokeless tobacco: Never Used  . Tobacco comment: 08-17-17- pt reports smokes "almost 2 packs a day"  Substance and Sexual Activity  . Alcohol use: No    Alcohol/week: 0.0 standard drinks  . Drug use: No  . Sexual activity: Yes    Partners: Male    Birth control/protection: Surgical  Other Topics Concern  . Not on file  Social History Narrative   5 living children, one child is a crack cocaine addict who often breaks into their house to steal money. He is currently in jail. One child died of cerebral palsy.   Caffeine use: Dr Malachi Bonds (3 per day)   2 cups coffee per day   Online student, currently not working   Social Determinants of Radio broadcast assistant Strain:   . Difficulty of Paying Living Expenses:   Food Insecurity:   . Worried About Charity fundraiser in the Last Year:   . Arboriculturist in the Last Year:   Transportation Needs:   . Film/video editor (Medical):   Marland Kitchen Lack of Transportation (Non-Medical):   Physical Activity:   . Days of Exercise per Week:   . Minutes of Exercise per Session:   Stress:   . Feeling of Stress :   Social Connections:   . Frequency of Communication with Friends and Family:   . Frequency of Social Gatherings with Friends and Family:   . Attends Religious Services:   . Active Member of Clubs or Organizations:   . Attends Archivist Meetings:   Marland Kitchen Marital Status:   Intimate Partner Violence:   . Fear of Current or Ex-Partner:   . Emotionally Abused:   Marland Kitchen Physically Abused:   . Sexually Abused:      Review of Systems: General: No chills,  fever, night sweats or weight changes  Cardiovascular:  No chest pain,  dyspnea on exertion, edema, orthopnea, palpitations, paroxysmal nocturnal dyspnea Dermatological: No rash, lesions or masses Respiratory: No cough, dyspnea Urologic: No hematuria, dysuria Abdominal: No nausea, vomiting, diarrhea, bright red blood per rectum, melena, or hematemesis Neurologic: No visual changes, weakness, changes in mental status All other systems reviewed and are otherwise negative except as noted above.  Physical Exam: There were no vitals filed for this visit.  GEN- The patient is well appearing, alert and oriented x 3 today.   HEENT: normocephalic, atraumatic; sclera clear, conjunctiva pink; hearing intact; oropharynx clear; neck supple, no JVP Lymph- no cervical lymphadenopathy Lungs- Clear to ausculation bilaterally, normal work of breathing.  No wheezes, rales, rhonchi Heart- Regular rate and rhythm, no murmurs, rubs or gallops, PMI not laterally displaced GI- soft, non-tender, non-distended, bowel sounds present, no hepatosplenomegaly Extremities- no clubbing, cyanosis, or edema; DP/PT/radial pulses 2+ bilaterally MS- no significant deformity or atrophy Skin- warm and dry, no rash or lesion Psych- euthymic mood, full affect Neuro- strength and sensation are intact  EKG is ordered. Personal review of EKG from today shows NSR at 87 bpm with normal intervals.  Assessment and Plan:  1. HTN She has known history of labile BP and is on florinef due to orthostatic hypotension No changes today. Per PCP  2. Atypical Chest Pain Normal cath in 2017  3. Chronic MSK pain Followed by Neurology  4. Anxiety with nonspecific psychological disorders Follows with psychiatry and neurology.  5. Hypercholesterolemia On rosuvastatin 20 mg daily per PCP  6. Carotid stenosis 50% RCA stenosis on CTA 06/2019. Per gen cards.  7. Tobacco abuse Encouraged cessation today  8. Chronic dyspnea Suspected related to COPD. She gets bronchitis often and continues to smoke.   Pt  has no active or previous EP needs. We will see prn. Otherwise, follow up with Dr. Sallyanne Kuster and his team for gen cards care.   Shirley Friar, PA-C  03/08/20 8:58 AM

## 2020-03-08 NOTE — Patient Instructions (Addendum)
Medication Instructions:  none *If you need a refill on your cardiac medications before your next appointment, please call your pharmacy*   Lab Work: none If you have labs (blood work) drawn today and your tests are completely normal, you will receive your results only by: Marland Kitchen MyChart Message (if you have MyChart) OR . A paper copy in the mail If you have any lab test that is abnormal or we need to change your treatment, we will call you to review the results.   Testing/Procedures: none   Follow-Up: August with Dr Sallyanne Kuster At [facility], you and your health needs are our priority.  As part of our continuing mission to provide you with exceptional heart care, we have created designated Provider Care Teams.  These Care Teams include your primary Cardiologist (physician) and Advanced Practice Providers (APPs -  Physician Assistants and Nurse Practitioners) who all work together to provide you with the care you need, when you need it.  We recommend signing up for the patient portal called "MyChart".  Sign up information is provided on this After Visit Summary.  MyChart is used to connect with patients for Virtual Visits (Telemedicine).  Patients are able to view lab/test results, encounter notes, upcoming appointments, etc.  Non-urgent messages can be sent to your provider as well.   To learn more about what you can do with MyChart, go to [email or web address].      Other Instructions

## 2020-03-09 NOTE — Pre-Procedure Instructions (Signed)
Ashley [name]  03/09/2020     Your procedure is scheduled on Thursday, March 17, 2020  Report to [facility] Admitting at 6:30 A.M.  Call this number if you have problems the morning of surgery:  [phone]   Remember: Brush your teeth the morning of surgery with your regular toothpaste.   Do not eat after midnight the night before surgery.   You may drink clear liquids until 5:30A.M. the morning of surgery.  Clear liquids allowed are:  Water, Juice (non-citric and without pulp), Carbonated beverages, Clear Tea and Gatorade  ( low sugar ) Please drink bottled water by 5:30A.M the morning of surgery ( Do not sip).    Take these medicines the morning of surgery with A SIP OF WATER :   clonazePAM (KLONOPIN)  fludrocortisone (FLORINEF)  pantoprazole (PROTONIX)  venlafaxine XR (EFFEXOR-XR) If needed: dicyclomine (BENTYL) for cramping and abdominal pain If needed:  Rimegepant Sulfate (NURTEC) for migraine If needed: promethazine (PHENERGAN) for nausea or vomiting If needed: nitroGLYCERIN (NITROSTAT) for chest pain If needed: albuterol (VENTOLIN HFA) inhaler for wheezing or shortness of breath ( Bring inhaler in with you on day of surgery).  Stop taking Aspirin (unless otherwise advised by surgeon); if no pre-op instructions were provided regarding Aspirin, please call the surgeon's office for instructions.   Stop taking vitamins, fish oil and herbal medications. Do not take any NSAIDs ie: etodolac (LODINE), Ibuprofen, Advil, Naproxen (Aleve), Motrin, BC and Goody Powder; stop now.     How to Manage Your Diabetes Before and After Surgery  Why is it important to control my blood sugar before and after surgery? . Improving blood sugar levels before and after surgery helps healing and can limit problems. . A way of improving blood sugar control is eating a healthy diet by: o  Eating less sugar and carbohydrates o  Increasing activity/exercise o  Talking with your doctor  about reaching your blood sugar goals . High blood sugars (greater than 180 mg/dL) can raise your risk of infections and slow your recovery, so you will need to focus on controlling your diabetes during the weeks before surgery. . Make sure that the doctor who takes care of your diabetes knows about your planned surgery including the date and location.  How do I manage my blood sugar before surgery? . Check your blood sugar at least 4 times a day, starting 2 days before surgery, to make sure that the level is not too high or low. o Check your blood sugar the morning of your surgery when you wake up and every 2 hours until you get to the Short Stay unit. . If your blood sugar is less than 70 mg/dL, you will need to treat for low blood sugar: o Do not take insulin. o Treat a low blood sugar (less than 70 mg/dL) with  cup of clear juice (cranberry or apple), 4 glucose tablets, OR glucose gel. Recheck blood sugar in 15 minutes after treatment (to make sure it is greater than 70 mg/dL). If your blood sugar is not greater than 70 mg/dL on recheck, call [phone] o  for further instructions. . Report your blood sugar to the short stay nurse when you get to Short Stay.  . If you are admitted to the hospital after surgery: o Your blood sugar will be checked by the staff and you will probably be given insulin after surgery (instead of oral diabetes medicines) to make sure you have good blood sugar levels.  o The goal for blood sugar control after surgery is 80-180 mg/dL.  WHAT DO I DO ABOUT MY DIABETES MEDICATION?   Marland Kitchen Do not take oral diabetes medicines (pills) the morning of surgery.  THE NIGHT BEFORE SURGERY, take 17 units of  insulin detemir (LEVEMIR) insulin  . The day of surgery, do not take other diabetes injectables, including Byetta (exenatide), Bydureon (exenatide ER), Victoza (liraglutide), or Trulicity (dulaglutide).  Reviewed and Endorsed by [facility] Patient Education Committee,  August 2015   Do not wear jewelry, make-up or nail polish.  Do not wear lotions, powders, or perfumes, or deodorant.  Do not shave 48 hours prior to surgery.   Do not bring valuables to the hospital.  [facility] is not responsible for any belongings or valuables.  Contacts, dentures or bridgework may not be worn into surgery.  Leave your suitcase in the car.  After surgery it may be brought to your room.  For patients admitted to the hospital, discharge time will be determined by your treatment team.  Patients discharged the day of surgery will not be allowed to drive home.   Special instructions: See " [facility] Preparing For Surgery " sheet.  Please read over the following fact sheets that you were given. Pain Booklet, Coughing and Deep Breathing and Surgical Site Infection Prevention

## 2020-03-10 ENCOUNTER — Other Ambulatory Visit: Payer: Self-pay

## 2020-03-10 ENCOUNTER — Encounter (HOSPITAL_COMMUNITY): Payer: Self-pay

## 2020-03-10 ENCOUNTER — Encounter (HOSPITAL_COMMUNITY)
Admission: RE | Admit: 2020-03-10 | Discharge: 2020-03-10 | Disposition: A | Discharge status: Home / Self Care | Payer: 59 | Source: Ambulatory Visit | Attending: Surgery | Admitting: Surgery

## 2020-03-10 DIAGNOSIS — Z79899 Other long term (current) drug therapy: Secondary | ICD-10-CM | POA: Diagnosis not present

## 2020-03-10 DIAGNOSIS — Z01812 Encounter for preprocedural laboratory examination: Secondary | ICD-10-CM | POA: Diagnosis not present

## 2020-03-10 DIAGNOSIS — I10 Essential (primary) hypertension: Secondary | ICD-10-CM | POA: Insufficient documentation

## 2020-03-10 HISTORY — DX: Nausea with vomiting, unspecified: R11.2

## 2020-03-10 HISTORY — DX: Depression, unspecified: F32.A

## 2020-03-10 HISTORY — DX: Chronic obstructive pulmonary disease, unspecified: J44.9

## 2020-03-10 HISTORY — DX: Unspecified benign mammary dysplasia of left breast: N60.92

## 2020-03-10 HISTORY — DX: Presence of spectacles and contact lenses: Z97.3

## 2020-03-10 HISTORY — DX: Other specified postprocedural states: Z98.890

## 2020-03-10 LAB — BASIC METABOLIC PANEL
Anion gap: 13 (ref 5–15)
BUN: 7 mg/dL (ref 6–20)
CO2: 22 mmol/L (ref 22–32)
Calcium: 9 mg/dL (ref 8.9–10.3)
Chloride: 104 mmol/L (ref 98–111)
Creatinine, Ser: 0.72 mg/dL (ref 0.44–1.00)
GFR calc Af Amer: >60 mL/min (ref >60)
GFR calc non Af Amer: >60 mL/min (ref >60)
Glucose, Bld: 160 mg/dL — ABNORMAL HIGH (ref 70–99)
Potassium: 3.5 mmol/L (ref 3.5–5.1)
Sodium: 139 mmol/L (ref 135–145)

## 2020-03-10 LAB — HEMOGLOBIN A1C
Hgb A1c MFr Bld: 6.4 % — ABNORMAL HIGH (ref 4.8–5.6)
Mean Plasma Glucose: 136.98 mg/dL

## 2020-03-10 LAB — GLUCOSE, CAPILLARY: Glucose-Capillary: 180 mg/dL — ABNORMAL HIGH (ref 70–99)

## 2020-03-10 LAB — CBC
HCT: 44.5 % (ref 36.0–46.0)
Hemoglobin: 15.1 g/dL — ABNORMAL HIGH (ref 12.0–15.0)
MCH: 31.6 pg (ref 26.0–34.0)
MCHC: 33.9 g/dL (ref 30.0–36.0)
MCV: 93.1 fL (ref 80.0–100.0)
Platelets: 350 10*3/uL (ref 150–400)
RBC: 4.78 MIL/uL (ref 3.87–5.11)
RDW: 13.8 % (ref 11.5–15.5)
WBC: 12.3 10*3/uL — ABNORMAL HIGH (ref 4.0–10.5)
nRBC: 0 % (ref 0.0–0.2)

## 2020-03-10 NOTE — Progress Notes (Signed)
Pt denies any acute cardiopulmonary issues. Pt stated that she is under the care of Dr. Sallyanne Kuster, Cardiology, Dr. Caryl Comes, Cardiology, Dr. Kandee Keen, PCP and Dr. Sarina Ill, Neurology. Pt denies recent labs. Pt stated that her fasting blood glucose is usually in the range of 130's. Pt stated that she will call surgeon to get pre-op Aspirin instructions. Pt reminded to quarantine. Pt verbalized understanding of all pre-op instructions. Pt chat forwarded to PA, Anesthesiology, for review.

## 2020-03-11 ENCOUNTER — Ambulatory Visit (INDEPENDENT_AMBULATORY_CARE_PROVIDER_SITE_OTHER)
Admission: RE | Admit: 2020-03-11 | Discharge: 2020-03-11 | Disposition: A | Discharge status: Home / Self Care | Payer: 59 | Source: Ambulatory Visit | Attending: Internal Medicine | Admitting: Internal Medicine

## 2020-03-11 DIAGNOSIS — R918 Other nonspecific abnormal finding of lung field: Secondary | ICD-10-CM

## 2020-03-11 NOTE — Progress Notes (Signed)
Anesthesia Chart Review:  History of atypical chest pain.  Patient had left heart catheterization in 2017 which revealed no angiographic evidence of CAD, normal LV systolic function, normal LV filling pressures.  History of labile HTN. Previously on antiHTN meds but developed hypotension now maintained on florinef.  Follows with neurology mostly for her migraine disorder but also for multiple neurologic and transient issues thought to be at least a component of conversion disorder. Recent CTA head/neck negative for vascular pathology  IDDMII A1c 6.4 on 03/10/20.  Preop labs reviewed, unremarkable.   Current smoker with mild COPD/Chronic bronchitis followed by PCP.  CT Chest 03/11/20: IMPRESSION: 1. 3 mm nodule, right upper lobe measuring smaller than on the prior CT. This can be considered benign with no further radiographic follow-up recommended. 2. No new nodules. 3. Right middle lobe scarring and/or atelectasis with bronchiectasis. This is stable. 4. No pneumonia or pulmonary edema.  No acute findings.  CTA head/neck 07/16/19: IMPRESSION: 1. Negative CTA of the head.  No evidence of vasculitis. 2. Cervical carotid atherosclerosis with up to 50% stenosis at the right ICA bulb.  TTE 02/18/19: 1. The left ventricle has normal systolic function with an ejection  fraction of 60-65%. The cavity size was normal. Left ventricular diastolic  parameters were normal.  2. The right ventricle has normal systolic function. The cavity was  normal. There is no increase in right ventricular wall thickness.  3. The mitral valve is normal in structure.  4. The tricuspid valve is normal in structure.  5. The aortic valve is tricuspid.  6. The pulmonic valve was normal in structure.   PFTs 01/14/18: FVC-%Pred-Pre Latest Units: % 73  FEV1-%Pred-Pre Latest Units: % 76  FEV1FVC-%Pred-Pre Latest Units: % 102  TLC % pred Latest Units: % 95  DLCO cor % pred Latest Units: % 59    Cath  08/17/16: 1. No angiographic evidence of CAD 2. Normal LV systolic function 3. Normal LV filling pressures  Recommendations: No further ischemic workup.    Wynonia Musty [facility] Short Stay Center/Anesthesiology Phone [phone] 03/11/2020 1:40 PM

## 2020-03-11 NOTE — Anesthesia Preprocedure Evaluation (Addendum)
Anesthesia Evaluation  Patient identified by MRN, date of birth, ID band Patient awake    Reviewed: Allergy & Precautions, H&P , NPO status , Patient's Chart, lab work & pertinent test results  History of Anesthesia Complications (+) PONV and history of anesthetic complications  Airway Mallampati: II  TM Distance: >3 FB Neck ROM: Full    Dental no notable dental hx. (+) Poor Dentition, Chipped, Missing, Loose,    Pulmonary neg pulmonary ROS, Current Smoker and Patient abstained from smoking.,    Pulmonary exam normal breath sounds clear to auscultation       Cardiovascular Exercise Tolerance: Good hypertension, Pt. on medications Normal cardiovascular exam Rhythm:Regular Rate:Normal  TTE 02/18/19: 1. The left ventricle has normal systolic function with an ejection  fraction of 60-65%. The cavity size was normal. Left ventricular diastolic  parameters were normal.  2. The right ventricle has normal systolic function. The cavity was  normal. There is no increase in right ventricular wall thickness.   Cath 08/17/16: 1. No angiographic evidence of CAD 2. Normal LV systolic function 3. Normal LV filling pressures   Neuro/Psych negative neurological ROS  negative psych ROS   GI/Hepatic negative GI ROS, Neg liver ROS, GERD  Medicated,  Endo/Other  diabetes  Renal/GU negative Renal ROS  negative genitourinary   Musculoskeletal negative musculoskeletal ROS (+)   Abdominal   Peds negative pediatric ROS (+)  Hematology negative hematology ROS (+)   Anesthesia Other Findings   Reproductive/Obstetrics negative OB ROS                           Anesthesia Physical Anesthesia Plan  ASA: III  Anesthesia Plan: General   Post-op Pain Management:    Induction: Intravenous  PONV Risk Score and Plan: 2 and Ondansetron and Treatment may vary due to age or medical condition  Airway Management  Planned: LMA  Additional Equipment:   Intra-op Plan:   Post-operative Plan:   Informed Consent: I have reviewed the patients History and Physical, chart, labs and discussed the procedure including the risks, benefits and alternatives for the proposed anesthesia with the patient or authorized representative who has indicated his/her understanding and acceptance.       Plan Discussed with: CRNA, Surgeon and Anesthesiologist  Anesthesia Plan Comments: (History of atypical chest pain.  Patient had left heart catheterization in 2017 which revealed no angiographic evidence of CAD, normal LV systolic function, normal LV filling pressures.  History of labile HTN. Previously on antiHTN meds but developed hypotension now maintained on florinef.  Follows with neurology mostly for her migraine disorder but also for multiple neurologic and transient issues thought to be at least a component of conversion disorder. Recent CTA head/neck negative for vascular pathology  IDDMII A1c 6.4 on 03/10/20.  Preop labs reviewed, unremarkable.   Current smoker with mild COPD/Chronic bronchitis followed by PCP.  CT Chest 03/11/20: IMPRESSION: 1. 3 mm nodule, right upper lobe measuring smaller than on the prior CT. This can be considered benign with no further radiographic follow-up recommended. 2. No new nodules. 3. Right middle lobe scarring and/or atelectasis with bronchiectasis. This is stable. 4. No pneumonia or pulmonary edema.  No acute findings.  CTA head/neck 07/16/19: IMPRESSION: 1. Negative CTA of the head.  No evidence of vasculitis. 2. Cervical carotid atherosclerosis with up to 50% stenosis at the right ICA bulb.  TTE 02/18/19: 1. The left ventricle has normal systolic function with an ejection  fraction  of 60-65%. The cavity size was normal. Left ventricular diastolic  parameters were normal.  2. The right ventricle has normal systolic function. The cavity was  normal. There is no  increase in right ventricular wall thickness.  3. The mitral valve is normal in structure.  4. The tricuspid valve is normal in structure.  5. The aortic valve is tricuspid.  6. The pulmonic valve was normal in structure.   PFTs 01/14/18: FVC-%Pred-Pre Latest Units: % 73 FEV1-%Pred-Pre Latest Units: % 76 FEV1FVC-%Pred-Pre Latest Units: % 102 TLC % pred Latest Units: % 95 DLCO cor % pred Latest Units: % 59   Cath 08/17/16: 1. No angiographic evidence of CAD 2. Normal LV systolic function 3. Normal LV filling pressures  Recommendations: No further ischemic workup. )       Anesthesia Quick Evaluation

## 2020-03-14 ENCOUNTER — Encounter: Payer: Self-pay | Admitting: Internal Medicine

## 2020-03-14 ENCOUNTER — Other Ambulatory Visit (HOSPITAL_COMMUNITY)
Admission: RE | Admit: 2020-03-14 | Discharge: 2020-03-14 | Disposition: A | Discharge status: Home / Self Care | Payer: 59 | Source: Ambulatory Visit | Attending: Surgery | Admitting: Surgery

## 2020-03-14 DIAGNOSIS — E7800 Pure hypercholesterolemia, unspecified: Secondary | ICD-10-CM | POA: Diagnosis not present

## 2020-03-14 DIAGNOSIS — Z01812 Encounter for preprocedural laboratory examination: Secondary | ICD-10-CM | POA: Insufficient documentation

## 2020-03-14 DIAGNOSIS — Z79890 Hormone replacement therapy (postmenopausal): Secondary | ICD-10-CM | POA: Diagnosis not present

## 2020-03-14 DIAGNOSIS — Z20822 Contact with and (suspected) exposure to covid-19: Secondary | ICD-10-CM | POA: Diagnosis not present

## 2020-03-14 DIAGNOSIS — Z794 Long term (current) use of insulin: Secondary | ICD-10-CM | POA: Diagnosis not present

## 2020-03-14 DIAGNOSIS — Z79899 Other long term (current) drug therapy: Secondary | ICD-10-CM | POA: Diagnosis not present

## 2020-03-14 DIAGNOSIS — J449 Chronic obstructive pulmonary disease, unspecified: Secondary | ICD-10-CM | POA: Diagnosis not present

## 2020-03-14 DIAGNOSIS — I10 Essential (primary) hypertension: Secondary | ICD-10-CM | POA: Diagnosis not present

## 2020-03-14 DIAGNOSIS — E119 Type 2 diabetes mellitus without complications: Secondary | ICD-10-CM | POA: Diagnosis not present

## 2020-03-14 DIAGNOSIS — F17200 Nicotine dependence, unspecified, uncomplicated: Secondary | ICD-10-CM | POA: Diagnosis not present

## 2020-03-14 DIAGNOSIS — N6092 Unspecified benign mammary dysplasia of left breast: Secondary | ICD-10-CM | POA: Diagnosis not present

## 2020-03-14 DIAGNOSIS — K219 Gastro-esophageal reflux disease without esophagitis: Secondary | ICD-10-CM | POA: Diagnosis not present

## 2020-03-14 DIAGNOSIS — N6489 Other specified disorders of breast: Secondary | ICD-10-CM | POA: Diagnosis not present

## 2020-03-14 DIAGNOSIS — G43909 Migraine, unspecified, not intractable, without status migrainosus: Secondary | ICD-10-CM | POA: Diagnosis not present

## 2020-03-14 DIAGNOSIS — N6022 Fibroadenosis of left breast: Secondary | ICD-10-CM | POA: Diagnosis not present

## 2020-03-14 LAB — SARS CORONAVIRUS 2 (TAT 6-24 HRS): SARS Coronavirus 2: NEGATIVE

## 2020-03-15 ENCOUNTER — Ambulatory Visit: Payer: Self-pay | Admitting: Family Medicine

## 2020-03-16 ENCOUNTER — Ambulatory Visit
Admission: RE | Admit: 2020-03-16 | Discharge: 2020-03-16 | Disposition: A | Discharge status: Home / Self Care | Payer: 59 | Source: Ambulatory Visit | Attending: Surgery | Admitting: Surgery

## 2020-03-16 ENCOUNTER — Other Ambulatory Visit: Payer: Self-pay

## 2020-03-16 DIAGNOSIS — N6092 Unspecified benign mammary dysplasia of left breast: Secondary | ICD-10-CM

## 2020-03-16 NOTE — H&P (Signed)
Ashley [name] Documented: 02/22/2020 9:59 AM Location: [facility] Patient #: [id number] DOB: Nov 09, 1966 Married / Language: English / Race: White Female   History of Present Illness ([name] MD; 02/22/2020 10:11 AM) The patient is a 54 year old female who presents with a complaint of Breast problems. Chief complaint: Atypical ductal hyperplasia and complex sclerosing lesion of the left breast  This is a 54 year old female who presents with atypia in the left breast. She had undergone mammograms bilateral breast pain and was seen to have an area of architectural distortion in the left breast. This was confirmed on ultrasound. There were no enlarged lymph nodes. She denies nipple discharge. She has no family history of breast cancer but does have a history of thyroid cancer in the family. She underwent biopsy of the area by radiology. The pathology showed atypical ductal hyperplasia and a complex sclerosing lesion of left breast. No malignancy was seen. She is otherwise without complaints.   Past Surgical History [name], CMA; 02/22/2020 10:00 AM) Appendectomy  Breast Biopsy  Left. Hysterectomy (due to cancer) - Partial  Sentinel Lymph Node Biopsy   Diagnostic Studies History ([name], CMA; 02/22/2020 10:00 AM) Colonoscopy  5-10 years ago Mammogram  within last year Pap Smear  1-5 years ago  Allergies ([name], CMA; 02/22/2020 10:02 AM) Penicillins  Anaphylaxis, Swelling. Topamax *ANTICONVULSANTS*  Metformin and Related   Medication History ([name], CMA; 02/22/2020 10:04 AM) Botulinum Toxin Type A (200UNIT For Solution, Injection) Active. Estradiol (0.0375MG [id number] Patch Weekly, Transdermal) Active. Etodolac (400MG  Tablet, Oral) Active. Fludrocortisone Acetate (0.1MG  Tablet, Oral) Active. Pantoprazole Sodium (40MG  Tablet DR, Oral) Active. traZODone HCl (150MG  Tablet, Oral) Active. Rimegepant Sulfate (75MG  Tablet Disint,  Oral) Active. Venlafaxine HCl ER (150MG  Capsule ER 24HR, Oral) Active. Albuterol Sulfate HFA (108 (90 Base)MCG/ACT Aerosol Soln, Inhalation) Active. Rosuvastatin Calcium (20MG  Tablet, Oral) Active. Levemir (100UNIT/ML Solution, Subcutaneous) Active. Dicyclomine HCl (10MG  Capsule, Oral) Active. Trulicity (0.75MG /0.5ML Soln Pen-inj, Subcutaneous) Active. clonazePAM (1MG  Tablet, Oral) Active. Medications Reconciled  Social History [name], CMA; 02/22/2020 10:00 AM) Caffeine use  Carbonated beverages, Coffee. No alcohol use  No drug use  Tobacco use  Current every day smoker.  Family History ([name], [name]; 02/22/2020 10:00 AM) Alcohol Abuse  Father, Son. Cerebrovascular Accident  Mother. Diabetes Mellitus  Family Members In General, Mother. Hypertension  Family Members In General, Mother. Kidney Disease  Mother. Migraine Headache  Mother. Respiratory Condition  Family Members In General. Thyroid problems  Mother.  Pregnancy / Birth History [name], [name]; 02/22/2020 10:00 AM) Age at menarche  42 years. Age of menopause  27-55 Gravida  3 Maternal age  75-20 Para  32  Other Problems ([name], [name]; 02/22/2020 10:00 AM) Back Pain  Cholelithiasis  Chronic Obstructive Lung Disease  Diabetes Mellitus  Gastroesophageal Reflux Disease  Hemorrhoids  High blood pressure  Hypercholesterolemia  Lump In Breast  Migraine Headache  Oophorectomy     Review of Systems ([name]; 02/22/2020 10:00 AM) General Present- Appetite Loss and Fatigue. Not Present- Chills, Fever, Night Sweats, Weight Gain and Weight Loss. Skin Not Present- Change in Wart/Mole, Dryness, Hives, Jaundice, New Lesions, Non-Healing Wounds, Rash and Ulcer. HEENT Present- Ringing in the Ears and Wears glasses/contact lenses. Not Present- Earache, Hearing Loss, Hoarseness, Nose Bleed, Oral Ulcers, Seasonal Allergies, Sinus Pain, Sore Throat, Visual  Disturbances and Yellow Eyes. Breast Present- Breast Mass and Breast Pain. Not Present- Nipple Discharge and Skin Changes. Cardiovascular Not Present- Chest Pain, Difficulty Breathing  Lying Down, Leg Cramps, Palpitations, Rapid Heart Rate, Shortness of Breath and Swelling of Extremities. Gastrointestinal Not Present- Abdominal Pain, Bloating, Bloody Stool, Change in Bowel Habits, Chronic diarrhea, Constipation, Difficulty Swallowing, Excessive gas, Gets full quickly at meals, Hemorrhoids, Indigestion, Nausea, Rectal Pain and Vomiting. Female Genitourinary Present- Frequency. Not Present- Nocturia, Painful Urination, Pelvic Pain and Urgency. Musculoskeletal Present- Joint Pain. Not Present- Back Pain, Joint Stiffness, Muscle Pain, Muscle Weakness and Swelling of Extremities. Neurological Present- Headaches. Not Present- Decreased Memory, Fainting, Numbness, Seizures, Tingling, Tremor, Trouble walking and Weakness. Psychiatric Not Present- Anxiety, Bipolar, Change in Sleep Pattern, Depression, Fearful and Frequent crying. Endocrine Present- Cold Intolerance. Not Present- Excessive Hunger, Hair Changes, Heat Intolerance, Hot flashes and New Diabetes. Hematology Not Present- Blood Thinners, Easy Bruising, Excessive bleeding, Gland problems, HIV and Persistent Infections.  Vitals ([name]; 02/22/2020 10:01 AM) 02/22/2020 10:00 AM Weight: 158 lb Height: 62in Body Surface Area: 1.73 m Body Mass Index: 28.9 kg/m  Temp.: 97.29F  Pulse: 105 (Regular)  P.OX: 96% (Room air) BP: 116/82 (Sitting, Left Arm, Standard)       Physical Exam ([name] MD; 02/22/2020 10:12 AM) The physical exam findings are as follows: Note:On exam, she is well appearance.  There is no supraclavicular or axillary adenopathy.  The left breast biopsy site is well-healed. There are no palpable breast masses. The nipple areolar complex is normal.    Assessment & Plan ([name] MD;  02/22/2020 10:14 AM) ATYPICAL DUCTAL HYPERPLASIA OF LEFT BREAST (N60.92) Impression: I have repeated the pectus mammogram and ultrasound. I reviewed the pathology results and gave a copy to the patient and her husband. I discussed the diagnosis in detail. A radioactive seed guided left breast lumpectomy is recommended to completely remove this area for histologic evaluation to rule out cancer. I discussed the reasons for this with them in detail. I discussed the surgical procedure in detail. I discussed the risks which includes but is not limited to bleeding, infection, injury to surrounding structures, the need for further procedures if malignancy is found, cardiopulmonary issues, postoperative recovery, etc.  Diabetes mellitus Tobacco abuse  We discussed her chronic medical problems including her diabetes and smoking and their relation to postoperative healing. I have encouraged her to quit smoking.  After discussion, she wishes to proceed with surgery which will be scheduled.  This visit required moderate decision-making. SCLEROSING ADENOSIS OF BREAST, LEFT (N60.22)

## 2020-03-17 ENCOUNTER — Ambulatory Visit (HOSPITAL_COMMUNITY): Payer: 59 | Admitting: Anesthesiology

## 2020-03-17 ENCOUNTER — Encounter (HOSPITAL_COMMUNITY): Payer: Self-pay | Admitting: Surgery

## 2020-03-17 ENCOUNTER — Ambulatory Visit (HOSPITAL_COMMUNITY)
Admission: RE | Admit: 2020-03-17 | Discharge: 2020-03-17 | Disposition: A | Discharge status: Home / Self Care | Payer: 59 | Source: Ambulatory Visit | Attending: Surgery | Admitting: Surgery

## 2020-03-17 ENCOUNTER — Encounter (HOSPITAL_COMMUNITY)
Admission: RE | Disposition: A | Discharge status: Home / Self Care | Payer: Self-pay | Source: Ambulatory Visit | Attending: Surgery

## 2020-03-17 ENCOUNTER — Ambulatory Visit
Admission: RE | Admit: 2020-03-17 | Discharge: 2020-03-17 | Disposition: A | Discharge status: Home / Self Care | Payer: 59 | Source: Ambulatory Visit | Attending: Surgery | Admitting: Surgery

## 2020-03-17 ENCOUNTER — Ambulatory Visit (HOSPITAL_COMMUNITY): Payer: 59 | Admitting: Physician Assistant

## 2020-03-17 ENCOUNTER — Other Ambulatory Visit: Payer: Self-pay

## 2020-03-17 DIAGNOSIS — E7800 Pure hypercholesterolemia, unspecified: Secondary | ICD-10-CM | POA: Insufficient documentation

## 2020-03-17 DIAGNOSIS — N6022 Fibroadenosis of left breast: Secondary | ICD-10-CM | POA: Insufficient documentation

## 2020-03-17 DIAGNOSIS — N6092 Unspecified benign mammary dysplasia of left breast: Secondary | ICD-10-CM | POA: Diagnosis not present

## 2020-03-17 DIAGNOSIS — F17200 Nicotine dependence, unspecified, uncomplicated: Secondary | ICD-10-CM | POA: Insufficient documentation

## 2020-03-17 DIAGNOSIS — E119 Type 2 diabetes mellitus without complications: Secondary | ICD-10-CM | POA: Insufficient documentation

## 2020-03-17 DIAGNOSIS — I10 Essential (primary) hypertension: Secondary | ICD-10-CM | POA: Insufficient documentation

## 2020-03-17 DIAGNOSIS — Z794 Long term (current) use of insulin: Secondary | ICD-10-CM | POA: Insufficient documentation

## 2020-03-17 DIAGNOSIS — K219 Gastro-esophageal reflux disease without esophagitis: Secondary | ICD-10-CM | POA: Insufficient documentation

## 2020-03-17 DIAGNOSIS — N6489 Other specified disorders of breast: Secondary | ICD-10-CM | POA: Insufficient documentation

## 2020-03-17 DIAGNOSIS — Z79899 Other long term (current) drug therapy: Secondary | ICD-10-CM | POA: Insufficient documentation

## 2020-03-17 DIAGNOSIS — Z79890 Hormone replacement therapy (postmenopausal): Secondary | ICD-10-CM | POA: Insufficient documentation

## 2020-03-17 DIAGNOSIS — Z20822 Contact with and (suspected) exposure to covid-19: Secondary | ICD-10-CM | POA: Insufficient documentation

## 2020-03-17 DIAGNOSIS — G43909 Migraine, unspecified, not intractable, without status migrainosus: Secondary | ICD-10-CM | POA: Insufficient documentation

## 2020-03-17 DIAGNOSIS — J449 Chronic obstructive pulmonary disease, unspecified: Secondary | ICD-10-CM | POA: Insufficient documentation

## 2020-03-17 HISTORY — PX: BREAST LUMPECTOMY WITH RADIOACTIVE SEED LOCALIZATION: SHX6424

## 2020-03-17 LAB — GLUCOSE, CAPILLARY
Glucose-Capillary: 170 mg/dL — ABNORMAL HIGH (ref 70–99)
Glucose-Capillary: 179 mg/dL — ABNORMAL HIGH (ref 70–99)

## 2020-03-17 SURGERY — BREAST LUMPECTOMY WITH RADIOACTIVE SEED LOCALIZATION
Anesthesia: General | Site: Breast | Laterality: Left

## 2020-03-17 MED ORDER — ONDANSETRON HCL 4 MG/2ML IJ SOLN
INTRAMUSCULAR | Status: AC
  Filled 2020-03-17: qty 2

## 2020-03-17 MED ORDER — BUPIVACAINE HCL 0.25 % IJ SOLN
INTRAMUSCULAR | Status: DC | PRN
  Administered 2020-03-17: 20 mL

## 2020-03-17 MED ORDER — OXYCODONE HCL 5 MG/5ML PO SOLN: 5.0000 mg | Freq: Once | ORAL | Status: DC | PRN

## 2020-03-17 MED ORDER — FENTANYL CITRATE (PF) 250 MCG/5ML IJ SOLN
INTRAMUSCULAR | Status: AC
  Filled 2020-03-17: qty 5

## 2020-03-17 MED ORDER — ONDANSETRON HCL 4 MG/2ML IJ SOLN: 4.0000 mg | Freq: Once | INTRAMUSCULAR | Status: DC | PRN

## 2020-03-17 MED ORDER — LACTATED RINGERS IV SOLN: INTRAVENOUS | Status: DC

## 2020-03-17 MED ORDER — CIPROFLOXACIN IN D5W 400 MG/200ML IV SOLN
400.0000 mg | INTRAVENOUS | Status: AC
  Administered 2020-03-17: 400 mg via INTRAVENOUS

## 2020-03-17 MED ORDER — MIDAZOLAM HCL 5 MG/5ML IJ SOLN
INTRAMUSCULAR | Status: DC | PRN
  Administered 2020-03-17: 2 mg via INTRAVENOUS

## 2020-03-17 MED ORDER — OXYCODONE HCL 5 MG PO TABS: 5.0000 mg | ORAL_TABLET | Freq: Once | ORAL | Status: DC | PRN

## 2020-03-17 MED ORDER — MEPERIDINE HCL 25 MG/ML IJ SOLN: 6.2500 mg | INTRAMUSCULAR | Status: DC | PRN

## 2020-03-17 MED ORDER — ACETAMINOPHEN 325 MG PO TABS: 325.0000 mg | ORAL_TABLET | ORAL | Status: DC | PRN

## 2020-03-17 MED ORDER — GABAPENTIN 300 MG PO CAPS: 300.0000 mg | ORAL_CAPSULE | ORAL | Status: AC

## 2020-03-17 MED ORDER — CIPROFLOXACIN IN D5W 400 MG/200ML IV SOLN
INTRAVENOUS | Status: AC
  Filled 2020-03-17: qty 200

## 2020-03-17 MED ORDER — ONDANSETRON HCL 4 MG/2ML IJ SOLN
INTRAMUSCULAR | Status: DC | PRN
  Administered 2020-03-17: 4 mg via INTRAVENOUS

## 2020-03-17 MED ORDER — FENTANYL CITRATE (PF) 100 MCG/2ML IJ SOLN
INTRAMUSCULAR | Status: DC | PRN
  Administered 2020-03-17: 50 ug via INTRAVENOUS

## 2020-03-17 MED ORDER — MIDAZOLAM HCL 2 MG/2ML IJ SOLN
INTRAMUSCULAR | Status: AC
  Filled 2020-03-17: qty 2

## 2020-03-17 MED ORDER — PROPOFOL 10 MG/ML IV BOLUS
INTRAVENOUS | Status: AC
  Filled 2020-03-17: qty 40

## 2020-03-17 MED ORDER — FENTANYL CITRATE (PF) 100 MCG/2ML IJ SOLN: 25.0000 ug | INTRAMUSCULAR | Status: DC | PRN

## 2020-03-17 MED ORDER — CELECOXIB 200 MG PO CAPS: 400.0000 mg | ORAL_CAPSULE | ORAL | Status: AC

## 2020-03-17 MED ORDER — CHLORHEXIDINE GLUCONATE CLOTH 2 % EX PADS: 6.0000 | MEDICATED_PAD | Freq: Once | CUTANEOUS | Status: DC

## 2020-03-17 MED ORDER — DIPHENHYDRAMINE HCL 50 MG/ML IJ SOLN
INTRAMUSCULAR | Status: AC
  Filled 2020-03-17: qty 1

## 2020-03-17 MED ORDER — CELECOXIB 200 MG PO CAPS
ORAL_CAPSULE | ORAL | Status: AC
  Administered 2020-03-17: 400 mg via ORAL
  Filled 2020-03-17: qty 2

## 2020-03-17 MED ORDER — ACETAMINOPHEN 500 MG PO TABS: 1000.0000 mg | ORAL_TABLET | ORAL | Status: AC

## 2020-03-17 MED ORDER — ACETAMINOPHEN 500 MG PO TABS
ORAL_TABLET | ORAL | Status: AC
  Administered 2020-03-17: 1000 mg via ORAL
  Filled 2020-03-17: qty 2

## 2020-03-17 MED ORDER — ENSURE PRE-SURGERY PO LIQD: 296.0000 mL | Freq: Once | ORAL | Status: DC

## 2020-03-17 MED ORDER — OXYCODONE HCL 5 MG PO TABS: 5.0000 mg | ORAL_TABLET | Freq: Four times a day (QID) | ORAL | 0 refills | Status: DC | PRN

## 2020-03-17 MED ORDER — PROPOFOL 10 MG/ML IV BOLUS
INTRAVENOUS | Status: DC | PRN
  Administered 2020-03-17: 150 mg via INTRAVENOUS

## 2020-03-17 MED ORDER — FENTANYL CITRATE (PF) 100 MCG/2ML IJ SOLN
INTRAMUSCULAR | Status: AC
  Filled 2020-03-17: qty 2

## 2020-03-17 MED ORDER — LIDOCAINE 2% (20 MG/ML) 5 ML SYRINGE
INTRAMUSCULAR | Status: DC | PRN
  Administered 2020-03-17: 100 mg via INTRAVENOUS

## 2020-03-17 MED ORDER — LIDOCAINE 2% (20 MG/ML) 5 ML SYRINGE
INTRAMUSCULAR | Status: AC
  Filled 2020-03-17: qty 5

## 2020-03-17 MED ORDER — LACTATED RINGERS IV SOLN: INTRAVENOUS | Status: DC | PRN

## 2020-03-17 MED ORDER — BUPIVACAINE HCL (PF) 0.25 % IJ SOLN
INTRAMUSCULAR | Status: AC
  Filled 2020-03-17: qty 30

## 2020-03-17 MED ORDER — ACETAMINOPHEN 160 MG/5ML PO SOLN: 325.0000 mg | ORAL | Status: DC | PRN

## 2020-03-17 MED ORDER — 0.9 % SODIUM CHLORIDE (POUR BTL) OPTIME
TOPICAL | Status: DC | PRN
  Administered 2020-03-17: 1000 mL

## 2020-03-17 MED ORDER — DIPHENHYDRAMINE HCL 50 MG/ML IJ SOLN
INTRAMUSCULAR | Status: DC | PRN
  Administered 2020-03-17: 12.5 mg via INTRAVENOUS

## 2020-03-17 MED ORDER — GABAPENTIN 300 MG PO CAPS
ORAL_CAPSULE | ORAL | Status: AC
  Administered 2020-03-17: 300 mg via ORAL
  Filled 2020-03-17: qty 1

## 2020-03-17 SURGICAL SUPPLY — 36 items
ADH SKN CLS APL DERMABOND .7 (GAUZE/BANDAGES/DRESSINGS) ×1
APL PRP STRL LF DISP 70% ISPRP (MISCELLANEOUS) ×1
APPLIER CLIP 9.375 MED OPEN (MISCELLANEOUS)
APR CLP MED 9.3 20 MLT OPN (MISCELLANEOUS)
BINDER BREAST LRG (GAUZE/BANDAGES/DRESSINGS) IMPLANT
BINDER BREAST XLRG (GAUZE/BANDAGES/DRESSINGS) IMPLANT
CANISTER SUCT 3000ML PPV (MISCELLANEOUS) ×2 IMPLANT
CHLORAPREP W/TINT 26 (MISCELLANEOUS) ×2 IMPLANT
CLIP APPLIE 9.375 MED OPEN (MISCELLANEOUS) ×1 IMPLANT
COVER PROBE W GEL 5X96 (DRAPES) ×2 IMPLANT
COVER SURGICAL LIGHT HANDLE (MISCELLANEOUS) ×2 IMPLANT
COVER WAND RF STERILE (DRAPES) ×1 IMPLANT
DERMABOND ADVANCED (GAUZE/BANDAGES/DRESSINGS) ×1
DERMABOND ADVANCED .7 DNX12 (GAUZE/BANDAGES/DRESSINGS) ×1 IMPLANT
DEVICE DUBIN SPECIMEN MAMMOGRA (MISCELLANEOUS) ×2 IMPLANT
DRAPE CHEST BREAST 15X10 FENES (DRAPES) ×2 IMPLANT
ELECT CAUTERY BLADE 6.4 (BLADE) ×2 IMPLANT
ELECT REM PT RETURN 9FT ADLT (ELECTROSURGICAL) ×2
ELECTRODE REM PT RTRN 9FT ADLT (ELECTROSURGICAL) ×1 IMPLANT
GAUZE 4X4 16PLY RFD (DISPOSABLE) ×1 IMPLANT
GLOVE SURG SIGNA 7.5 PF LTX (GLOVE) ×2 IMPLANT
GOWN STRL REUS W/ TWL LRG LVL3 (GOWN DISPOSABLE) ×1 IMPLANT
GOWN STRL REUS W/ TWL XL LVL3 (GOWN DISPOSABLE) ×1 IMPLANT
GOWN STRL REUS W/TWL LRG LVL3 (GOWN DISPOSABLE) ×2
GOWN STRL REUS W/TWL XL LVL3 (GOWN DISPOSABLE) ×2
KIT BASIN OR (CUSTOM PROCEDURE TRAY) ×2 IMPLANT
KIT MARKER MARGIN INK (KITS) ×2 IMPLANT
NDL HYPO 25GX1X1/2 BEV (NEEDLE) ×1 IMPLANT
NEEDLE HYPO 25GX1X1/2 BEV (NEEDLE) ×2 IMPLANT
NS IRRIG 1000ML POUR BTL (IV SOLUTION) IMPLANT
PACK GENERAL/GYN (CUSTOM PROCEDURE TRAY) ×2 IMPLANT
SUT MNCRL AB 4-0 PS2 18 (SUTURE) ×2 IMPLANT
SUT VIC AB 3-0 SH 18 (SUTURE) ×2 IMPLANT
SYR CONTROL 10ML LL (SYRINGE) ×2 IMPLANT
TOWEL GREEN STERILE (TOWEL DISPOSABLE) ×2 IMPLANT
TOWEL GREEN STERILE FF (TOWEL DISPOSABLE) ×2 IMPLANT

## 2020-03-17 NOTE — Discharge Instructions (Signed)
[facility] Office Phone Number [phone]  BREAST BIOPSY/ PARTIAL MASTECTOMY: POST OP INSTRUCTIONS  Always review your discharge instruction sheet given to you by the facility where your surgery was performed.  IF YOU HAVE DISABILITY OR FAMILY LEAVE FORMS, YOU MUST BRING THEM TO THE OFFICE FOR PROCESSING.  DO NOT GIVE THEM TO YOUR DOCTOR.  1. A prescription for pain medication may be given to you upon discharge.  Take your pain medication as prescribed, if needed.  If narcotic pain medicine is not needed, then you may take acetaminophen (Tylenol) or ibuprofen (Advil) as needed. 2. Take your usually prescribed medications unless otherwise directed 3. If you need a refill on your pain medication, please contact your pharmacy.  They will contact our office to request authorization.  Prescriptions will not be filled after 5pm or on week-ends. 4. You should eat very light the first 24 hours after surgery, such as soup, crackers, pudding, etc.  Resume your normal diet the day after surgery. 5. Most patients will experience some swelling and bruising in the breast.  Ice packs and a good support bra will help.  Swelling and bruising can take several days to resolve.  6. It is common to experience some constipation if taking pain medication after surgery.  Increasing fluid intake and taking a stool softener will usually help or prevent this problem from occurring.  A mild laxative (Milk of Magnesia or Miralax) should be taken according to package directions if there are no bowel movements after 48 hours. 7. Unless discharge instructions indicate otherwise, you may remove your bandages 24-48 hours after surgery, and you may shower at that time.  You may have steri-strips (small skin tapes) in place directly over the incision.  These strips should be left on the skin for 7-10 days.  If your surgeon used skin glue on the incision, you may shower in 24 hours.  The glue will flake off over the  next 2-3 weeks.  Any sutures or staples will be removed at the office during your follow-up visit. 8. ACTIVITIES:  You may resume regular daily activities (gradually increasing) beginning the next day.  Wearing a good support bra or sports bra minimizes pain and swelling.  You may have sexual intercourse when it is comfortable. a. You may drive when you no longer are taking prescription pain medication, you can comfortably wear a seatbelt, and you can safely maneuver your car and apply brakes. b. RETURN TO WORK:  ______________________________________________________________________________________ 9. You should see your doctor in the office for a follow-up appointment approximately two weeks after your surgery.  Your doctor's nurse will typically make your follow-up appointment when she calls you with your pathology report.  Expect your pathology report 2-3 business days after your surgery.  You may call to check if you do not hear from us after three days. 10. OTHER INSTRUCTIONS:OK TO SHOWER STARTING TOMORROW 11. ICE PACK, TYLENOL, AND IBUPROFEN ALSO FOR PAIN 12. NO VIGOROUS ACTIVITY FOR ONE WEEK _______________________________________________________________________________________________ _____________________________________________________________________________________________________________________________________ _____________________________________________________________________________________________________________________________________ _____________________________________________________________________________________________________________________________________  WHEN TO CALL YOUR DOCTOR: 1. Fever over 101.0 2. Nausea and/or vomiting. 3. Extreme swelling or bruising. 4. Continued bleeding from incision. 5. Increased pain, redness, or drainage from the incision.  The clinic staff is available to answer your questions during regular business hours.  Please don't hesitate to  call and ask to speak to one of the nurses for clinical concerns.  If you have a medical emergency, go to the nearest emergency room or call 911.  A   surgeon from [facility] is always on call at the hospital.  For further questions, please visit [email or web address]  

## 2020-03-17 NOTE — Anesthesia Procedure Notes (Signed)
Procedure Name: LMA Insertion Date/Time: 03/17/2020 8:20 AM Performed by: Alain [name], CRNA Pre-anesthesia Checklist: Patient identified, Emergency Drugs available, Suction available and Patient being monitored Patient Re-evaluated:Patient Re-evaluated prior to induction Oxygen Delivery Method: Circle System Utilized Preoxygenation: Pre-oxygenation with 100% oxygen Induction Type: IV induction Ventilation: Mask ventilation without difficulty LMA: LMA inserted LMA Size: 4.0 Number of attempts: 1 Airway Equipment and Method: Bite block Placement Confirmation: positive ETCO2 Tube secured with: Tape Dental Injury: Teeth and Oropharynx as per pre-operative assessment

## 2020-03-17 NOTE — Interval H&P Note (Signed)
History and Physical Interval Note:no change in H and P  03/17/2020 8:04 AM  Ashley [name]  has presented today for surgery, with the diagnosis of LEFT BREAST ATYPICAL DUCTAL HYPERPLASIA AND COMPLEX SCLEROSING LESION.  The various methods of treatment have been discussed with the patient and family. After consideration of risks, benefits and other options for treatment, the patient has consented to  Procedure(s) with comments: LEFT BREAST LUMPECTOMY WITH RADIOACTIVE SEED LOCALIZATION (Left) - LMA as a surgical intervention.  The patient's history has been reviewed, patient examined, no change in status, stable for surgery.  I have reviewed the patient's chart and labs.  Questions were answered to the patient's satisfaction.     Coralie Keens

## 2020-03-17 NOTE — Op Note (Signed)
LEFT BREAST LUMPECTOMY WITH RADIOACTIVE SEED LOCALIZATION  Procedure Note  Ashley [name] 03/17/2020   Pre-op Diagnosis: LEFT BREAST ATYPICAL DUCTAL HYPERPLASIA AND COMPLEX SCLEROSING LESION     Post-op Diagnosis: same  Procedure(s): LEFT BREAST LUMPECTOMY WITH RADIOACTIVE SEED LOCALIZATION  Surgeon(s): Coralie Keens, MD  Anesthesia: General  Staff:  Circulator: Carlynn Purl, RN Scrub Person: Johnanna Schneiders, RN; Dollene Cleveland T  Estimated Blood Loss: Minimal               Specimens: sent to path  Indications: This is a 54 year old female that was found to have an architectural distortion on the left breast mammogram.  She underwent a biopsy of this area showing atypical ductal hyperplasia arising from a complex grossing lesion.  The decision was made to proceed with a lumpectomy to excise this area with a radioactive seed guided localization  Procedure: The patient was brought to operating identifies the correct patient.  She is placed upon the operating table general anesthesia was induced.  Her left breast was prepped and draped in usual sterile fashion.  Using the neoprobe, the seed was located in the retroareolar location.  I anesthetized the skin at the lateral edge of the areola with Marcaine.  I then made an incision in this area with a scalpel.  I then dissected toward the radioactive seed with the aid of the neoprobe.  I performed a lumpectomy staying around the seed with aid of the neoprobe and the cautery.  This was again in the retroareolar location.  Once the specimen was removed, I marked all margins with paint and then x-rayed the specimen.  This confirmed that the previously placed clip and radioactive seed were in the specimen.  The specimen was sent to pathology for evaluation.  I achieved hemostasis with the cautery.  I anesthetized the incision further with Marcaine.  I then closed the subcutaneous tissue with interrupted 3-0 Vicryl sutures and closed the  skin with a running 4-0 Monocryl.  Dermabond was then applied.  The patient tolerated the procedure well.  All the counts were correct at the end of the procedure.  The patient was then extubated in the operating room and taken in a stable condition to the recovery room.          Coralie Keens   Date: 03/17/2020  Time: 8:57 AM

## 2020-03-17 NOTE — Transfer of Care (Signed)
Immediate Anesthesia Transfer of Care Note  Patient: Ashley [name]  Procedure(s) Performed: LEFT BREAST LUMPECTOMY WITH RADIOACTIVE SEED LOCALIZATION (Left Breast)  Patient Location: PACU  Anesthesia Type:General  Level of Consciousness: awake, alert  and oriented  Airway & Oxygen Therapy: Patient Spontanous Breathing and Patient connected to face mask oxygen  Post-op Assessment: Report given to RN and Post -op Vital signs reviewed and stable  Post vital signs: Reviewed and stable  Last Vitals:  Vitals Value Taken Time  BP 120/86 03/17/20 0907  Temp 36 C 03/17/20 0903  Pulse 76 03/17/20 0916  Resp 13 03/17/20 0916  SpO2 92 % 03/17/20 0916  Vitals shown include unvalidated device data.  Last Pain:  Vitals:   03/17/20 0903  TempSrc:   PainSc: Asleep      Patients Stated Pain Goal: 2 ([id number])  Complications: No apparent anesthesia complications

## 2020-03-18 LAB — SURGICAL PATHOLOGY

## 2020-03-20 NOTE — Anesthesia Postprocedure Evaluation (Signed)
Anesthesia Post Note  Patient: Ashley [name]  Procedure(s) Performed: LEFT BREAST LUMPECTOMY WITH RADIOACTIVE SEED LOCALIZATION (Left Breast)     Anesthesia Post Evaluation  Last Vitals:  Vitals:   03/17/20 0917 03/17/20 0926  BP: 139/69   Pulse: 75 68  Resp: 15 20  Temp:    SpO2: 92% 95%    Last Pain:  Vitals:   03/17/20 0917  TempSrc:   PainSc: 0-No pain                 Vernadette Stutsman

## 2020-03-21 ENCOUNTER — Other Ambulatory Visit: Payer: Self-pay | Admitting: Internal Medicine

## 2020-03-21 DIAGNOSIS — E611 Iron deficiency: Secondary | ICD-10-CM

## 2020-03-31 ENCOUNTER — Ambulatory Visit: Payer: 59 | Attending: Internal Medicine

## 2020-03-31 DIAGNOSIS — Z23 Encounter for immunization: Secondary | ICD-10-CM

## 2020-03-31 NOTE — Progress Notes (Signed)
   Covid-19 Vaccination Clinic  Name:  Ashley [name]    MRN: [id number] DOB: May 08, 1966  03/31/2020  [name] was observed post Covid-19 immunization for 15 minutes without incident. She was provided with Vaccine Information Sheet and instruction to access the V-Safe system.   [name] was instructed to call 911 with any severe reactions post vaccine: Marland Kitchen Difficulty breathing  . Swelling of face and throat  . A fast heartbeat  . A bad rash all over body  . Dizziness and weakness   Immunizations Administered    Name Date Dose VIS Date Route   Pfizer COVID-19 Vaccine 03/31/2020  8:52 AM 0.3 mL 12/04/2019 Intramuscular   Manufacturer: [place]   Lot: [id number]   [place]: [id number]

## 2020-04-12 ENCOUNTER — Ambulatory Visit (INDEPENDENT_AMBULATORY_CARE_PROVIDER_SITE_OTHER): Payer: 59 | Admitting: Family Medicine

## 2020-04-12 ENCOUNTER — Other Ambulatory Visit: Payer: Self-pay

## 2020-04-12 DIAGNOSIS — G43711 Chronic migraine without aura, intractable, with status migrainosus: Secondary | ICD-10-CM | POA: Diagnosis not present

## 2020-04-12 NOTE — Progress Notes (Signed)
Botox- 200 units x 1 vial Lot: [id number] Expiration: 09/2022 NDC: [id number]  Bacteriostatic 0.9% Sodium Chloride- 59mL total Lot: [id number] Expiration: 06/23/2020 NDC: [id number]  Dx: [id number] S/P

## 2020-04-12 NOTE — Progress Notes (Signed)
She is resuming Botox therapy.  She has been off therapy for the past 5 months due to change in her insurance.  She reports that migraines have worsened in frequency and severity.  She is now having about 16 migrainous days per month.  She does continue [name] monthly and Effexor 150 mg daily for prevention.  She uses Nurtec and Phenergan for abortive therapy.  She is also on chronic opioids for chronic pain.   Consent Form Botulism Toxin Injection For Chronic Migraine    Reviewed orally with patient, additionally signature is on file:  Botulism toxin has been approved by the Federal drug administration for treatment of chronic migraine. Botulism toxin does not cure chronic migraine and it may not be effective in some patients.  The administration of botulism toxin is accomplished by injecting a small amount of toxin into the muscles of the neck and head. Dosage must be titrated for each individual. Any benefits resulting from botulism toxin tend to wear off after 3 months with a repeat injection required if benefit is to be maintained. Injections are usually done every 3-4 months with maximum effect peak achieved by about 2 or 3 weeks. Botulism toxin is expensive and you should be sure of what costs you will incur resulting from the injection.  The side effects of botulism toxin use for chronic migraine may include:   -Transient, and usually mild, facial weakness with facial injections  -Transient, and usually mild, head or neck weakness with head/neck injections  -Reduction or loss of forehead facial animation due to forehead muscle weakness  -Eyelid drooping  -Dry eye  -Pain at the site of injection or bruising at the site of injection  -Double vision  -Potential unknown long term risks   Contraindications: You should not have Botox if you are pregnant, nursing, allergic to albumin, have an infection, skin condition, or muscle weakness at the site of the injection, or have  myasthenia gravis, Lambert-Eaton syndrome, or ALS.  It is also possible that as with any injection, there may be an allergic reaction or no effect from the medication. Reduced effectiveness after repeated injections is sometimes seen and rarely infection at the injection site may occur. All care will be taken to prevent these side effects. If therapy is given over a long time, atrophy and wasting in the muscle injected may occur. Occasionally the patient's become refractory to treatment because they develop antibodies to the toxin. In this event, therapy needs to be modified.  I have read the above information and consent to the administration of botulism toxin.    BOTOX PROCEDURE NOTE FOR MIGRAINE HEADACHE  Contraindications and precautions discussed with patient(above). Aseptic procedure was observed and patient tolerated procedure. Procedure performed by Debbora Presto, FNP-C.   The condition has existed for more than 6 months, and pt does not have a diagnosis of ALS, Myasthenia Gravis or Lambert-Eaton Syndrome.  Risks and benefits of injections discussed and pt agrees to proceed with the procedure.  Written consent obtained  These injections are medically necessary. Pt  receives good benefits from these injections. These injections do not cause sedations or hallucinations which the oral therapies may cause.   Description of procedure:  The patient was placed in a sitting position. The standard protocol was used for Botox as follows, with 5 units of Botox injected at each site:  -Procerus muscle, midline injection  -Corrugator muscle, bilateral injection  -Frontalis muscle, bilateral injection, with 2 sites each side, medial injection was  performed in the upper one third of the frontalis muscle, in the region vertical from the medial inferior edge of the superior orbital rim. The lateral injection was again in the upper one third of the forehead vertically above the lateral limbus of the  cornea, 1.5 cm lateral to the medial injection site.  -Temporalis muscle injection, 4 sites, bilaterally. The first injection was 3 cm above the tragus of the ear, second injection site was 1.5 cm to 3 cm up from the first injection site in line with the tragus of the ear. The third injection site was 1.5-3 cm forward between the first 2 injection sites. The fourth injection site was 1.5 cm posterior to the second injection site. 5th site laterally in the temporalis  muscleat the level of the outer canthus.  -Occipitalis muscle injection, 3 sites, bilaterally. The first injection was done one half way between the occipital protuberance and the tip of the mastoid process behind the ear. The second injection site was done lateral and superior to the first, 1 fingerbreadth from the first injection. The third injection site was 1 fingerbreadth superiorly and medially from the first injection site.  -Cervical paraspinal muscle injection, 2 sites, bilaterally. The first injection site was 1 cm from the midline of the cervical spine, 3 cm inferior to the lower border of the occipital protuberance. The second injection site was 1.5 cm superiorly and laterally to the first injection site.  -Trapezius muscle injection was performed at 3 sites, bilaterally. The first injection site was in the upper trapezius muscle halfway between the inflection point of the neck, and the acromion. The second injection site was one half way between the acromion and the first injection site. The third injection was done between the first injection site and the inflection point of the neck.   Will return for repeat injection in 3 months.   A total of 200 units of Botox was prepared, 155 units of Botox was injected as documented above, any Botox not injected was wasted. The patient tolerated the procedure well, there were no complications of the above procedure.

## 2020-04-14 ENCOUNTER — Ambulatory Visit: Payer: 59 | Admitting: Family Medicine

## 2020-04-15 ENCOUNTER — Other Ambulatory Visit: Payer: Self-pay | Admitting: Internal Medicine

## 2020-04-15 DIAGNOSIS — I959 Hypotension, unspecified: Secondary | ICD-10-CM

## 2020-04-20 ENCOUNTER — Other Ambulatory Visit: Payer: Self-pay | Admitting: Internal Medicine

## 2020-04-20 DIAGNOSIS — E1165 Type 2 diabetes mellitus with hyperglycemia: Secondary | ICD-10-CM

## 2020-04-20 DIAGNOSIS — E118 Type 2 diabetes mellitus with unspecified complications: Secondary | ICD-10-CM

## 2020-04-25 ENCOUNTER — Other Ambulatory Visit: Payer: Self-pay | Admitting: Internal Medicine

## 2020-04-25 ENCOUNTER — Ambulatory Visit: Payer: 59 | Attending: Internal Medicine

## 2020-04-25 ENCOUNTER — Telehealth: Payer: Self-pay

## 2020-04-25 DIAGNOSIS — N951 Menopausal and female climacteric states: Secondary | ICD-10-CM

## 2020-04-25 DIAGNOSIS — Z23 Encounter for immunization: Secondary | ICD-10-CM

## 2020-04-25 NOTE — Telephone Encounter (Signed)
Pt is due for a follow up appointment.   Can you call and ask her to schedule. I can send in refill once that is scheduled.

## 2020-04-25 NOTE — Telephone Encounter (Signed)
New message    The patient had her second COVID vaccine shot should she take her TRULICITY [id number] SOPN this morning    Please advise.

## 2020-04-25 NOTE — Progress Notes (Signed)
   Covid-19 Vaccination Clinic  Name:  Ashley [name]    MRN: [id number] DOB: [birth year]  04/25/2020  [name] was observed post Covid-19 immunization for 15 minutes without incident. She was provided with Vaccine Information Sheet and instruction to access the V-Safe system.   [name] was instructed to call 911 with any severe reactions post vaccine: Marland Kitchen Difficulty breathing  . Swelling of face and throat  . A fast heartbeat  . A bad rash all over body  . Dizziness and weakness   Immunizations Administered    Name Date Dose VIS Date Route   Pfizer COVID-19 Vaccine 04/25/2020  8:28 AM 0.3 mL 02/17/2019 Intramuscular   Manufacturer: [place]   Lot: [id number]   [place]: [id number]

## 2020-04-25 NOTE — Telephone Encounter (Signed)
LVM for patient to call back to schedule a follow up.

## 2020-04-26 ENCOUNTER — Telehealth: Payer: Self-pay | Admitting: Family Medicine

## 2020-04-26 ENCOUNTER — Ambulatory Visit: Payer: 59 | Admitting: Internal Medicine

## 2020-04-26 ENCOUNTER — Other Ambulatory Visit: Payer: Self-pay | Admitting: Internal Medicine

## 2020-04-26 NOTE — Telephone Encounter (Signed)
I called pt and relayed per AL/NP that since gap of botox of 5 months, that the restart may take 2-3 months to give some relief unfortunately.  She is to take her meds that she has now prns and daily preventatives as ordered.  Hydrate well, rest, eat healthy avoid triggers.  If any headache (worse headache ever to seek care at ED call 911).  I relayed that Dr. Ronnald Ramp, pcp did fill phenergan.  She appreciated call.

## 2020-04-26 NOTE — Telephone Encounter (Signed)
From last botox note: She is now having about 16 migrainous days per month.  She does continue [name] monthly and Effexor 150 mg daily for prevention.  She uses Nurtec and Phenergan for abortive therapy.  She is also on chronic opioids for chronic pain. Do you have any other suggestions for her?

## 2020-04-26 NOTE — Telephone Encounter (Signed)
Pt called stating that she has recently had her Botox inj. And she is still having a headache for a week now and is wanting to be advised on what else can she do to alleviate the headaches.

## 2020-04-26 NOTE — Telephone Encounter (Signed)
Pt has called stating she has not heard from anyone re: the migraines she is having.  Pt advised RN is allowed 24-48 hours to respond to all messages.  Pt asked that it be noted she is need of a refill on her promethazine (PHENERGAN) 25 MG tablet to [facility] [phone]

## 2020-04-26 NOTE — Telephone Encounter (Signed)
Please let her know that I have reviewed her chart. I have only performed Botox and not seen her for follow up. Dr Jaynee Eagles has tried about everything I can think of to try to help with these headaches. She went without Botox for more than 5 months so it may take some time for it to start working again. Sometimes it can take 2-3 rounds of Botox. Her last procedure was about 2 weeks ago. I am hopeful that her headaches will improve if she continues Botox regularly. She is taking CGRP monthly and tried and failed many other preventatives int he past. Headaches can come from many different things. I would have her make sure that there are no concerns of allergy symptoms. PCP can help her if this is a concern. Have her make sure BP is normal. She needs to stay well hydrated. She is on daily opioids which can sometimes worsen headaches. It is hard to say in her case. Dr Ronnald Ramp filled her phenergan. If she has any red flags with headache she should seek emergency medical attention.

## 2020-04-27 ENCOUNTER — Emergency Department (HOSPITAL_COMMUNITY): Payer: 59

## 2020-04-27 ENCOUNTER — Emergency Department (HOSPITAL_COMMUNITY)
Admission: EM | Admit: 2020-04-27 | Discharge: 2020-04-27 | Disposition: A | Discharge status: Home / Self Care | Payer: 59 | Attending: Emergency Medicine | Admitting: Emergency Medicine

## 2020-04-27 DIAGNOSIS — F17210 Nicotine dependence, cigarettes, uncomplicated: Secondary | ICD-10-CM | POA: Insufficient documentation

## 2020-04-27 DIAGNOSIS — R55 Syncope and collapse: Secondary | ICD-10-CM | POA: Diagnosis not present

## 2020-04-27 DIAGNOSIS — Z79899 Other long term (current) drug therapy: Secondary | ICD-10-CM | POA: Diagnosis not present

## 2020-04-27 DIAGNOSIS — I10 Essential (primary) hypertension: Secondary | ICD-10-CM | POA: Insufficient documentation

## 2020-04-27 DIAGNOSIS — R531 Weakness: Secondary | ICD-10-CM | POA: Diagnosis not present

## 2020-04-27 DIAGNOSIS — J449 Chronic obstructive pulmonary disease, unspecified: Secondary | ICD-10-CM | POA: Diagnosis not present

## 2020-04-27 DIAGNOSIS — R4182 Altered mental status, unspecified: Secondary | ICD-10-CM | POA: Diagnosis present

## 2020-04-27 DIAGNOSIS — Z7982 Long term (current) use of aspirin: Secondary | ICD-10-CM | POA: Diagnosis not present

## 2020-04-27 DIAGNOSIS — Z794 Long term (current) use of insulin: Secondary | ICD-10-CM | POA: Insufficient documentation

## 2020-04-27 DIAGNOSIS — E119 Type 2 diabetes mellitus without complications: Secondary | ICD-10-CM | POA: Insufficient documentation

## 2020-04-27 LAB — I-STAT CHEM 8, ED
BUN: 5 mg/dL — ABNORMAL LOW (ref 6–20)
Calcium, Ion: 1.01 mmol/L — ABNORMAL LOW (ref 1.15–1.40)
Chloride: 105 mmol/L (ref 98–111)
Creatinine, Ser: 0.6 mg/dL (ref 0.44–1.00)
Glucose, Bld: 165 mg/dL — ABNORMAL HIGH (ref 70–99)
HCT: 40 % (ref 36.0–46.0)
Hemoglobin: 13.6 g/dL (ref 12.0–15.0)
Potassium: 3 mmol/L — ABNORMAL LOW (ref 3.5–5.1)
Sodium: 142 mmol/L (ref 135–145)
TCO2: 26 mmol/L (ref 22–32)

## 2020-04-27 LAB — CBC
HCT: 41.2 % (ref 36.0–46.0)
Hemoglobin: 13.9 g/dL (ref 12.0–15.0)
MCH: 31.6 pg (ref 26.0–34.0)
MCHC: 33.7 g/dL (ref 30.0–36.0)
MCV: 93.6 fL (ref 80.0–100.0)
Platelets: 281 10*3/uL (ref 150–400)
RBC: 4.4 MIL/uL (ref 3.87–5.11)
RDW: 13.9 % (ref 11.5–15.5)
WBC: 5 10*3/uL (ref 4.0–10.5)
nRBC: 0 % (ref 0.0–0.2)

## 2020-04-27 LAB — COMPREHENSIVE METABOLIC PANEL
ALT: 16 U/L (ref 0–44)
AST: 20 U/L (ref 15–41)
Albumin: 3.1 g/dL — ABNORMAL LOW (ref 3.5–5.0)
Alkaline Phosphatase: 83 U/L (ref 38–126)
Anion gap: 10 (ref 5–15)
BUN: 6 mg/dL (ref 6–20)
CO2: 22 mmol/L (ref 22–32)
Calcium: 8.4 mg/dL — ABNORMAL LOW (ref 8.9–10.3)
Chloride: 109 mmol/L (ref 98–111)
Creatinine, Ser: 0.79 mg/dL (ref 0.44–1.00)
GFR calc Af Amer: >60 mL/min (ref >60)
GFR calc non Af Amer: >60 mL/min (ref >60)
Glucose, Bld: 176 mg/dL — ABNORMAL HIGH (ref 70–99)
Potassium: 3.1 mmol/L — ABNORMAL LOW (ref 3.5–5.1)
Sodium: 141 mmol/L (ref 135–145)
Total Bilirubin: 0.7 mg/dL (ref 0.3–1.2)
Total Protein: 5.7 g/dL — ABNORMAL LOW (ref 6.5–8.1)

## 2020-04-27 LAB — DIFFERENTIAL
Abs Immature Granulocytes: 0.01 10*3/uL (ref 0.00–0.07)
Basophils Absolute: 0.1 10*3/uL (ref 0.0–0.1)
Basophils Relative: 1 %
Eosinophils Absolute: 0.2 10*3/uL (ref 0.0–0.5)
Eosinophils Relative: 4 %
Immature Granulocytes: 0 %
Lymphocytes Relative: 39 %
Lymphs Abs: 1.9 10*3/uL (ref 0.7–4.0)
Monocytes Absolute: 0.7 10*3/uL (ref 0.1–1.0)
Monocytes Relative: 13 %
Neutro Abs: 2.1 10*3/uL (ref 1.7–7.7)
Neutrophils Relative %: 43 %

## 2020-04-27 LAB — CBG MONITORING, ED: Glucose-Capillary: 162 mg/dL — ABNORMAL HIGH (ref 70–99)

## 2020-04-27 LAB — PROTIME-INR
INR: 1 (ref 0.8–1.2)
Prothrombin Time: 12.7 seconds (ref 11.4–15.2)

## 2020-04-27 LAB — ETHANOL: Alcohol, Ethyl (B): <10 mg/dL (ref <10)

## 2020-04-27 LAB — APTT: aPTT: 29 seconds (ref 24–36)

## 2020-04-27 LAB — I-STAT BETA HCG BLOOD, ED (MC, WL, AP ONLY): I-stat hCG, quantitative: <5 m[IU]/mL (ref <5)

## 2020-04-27 MED ORDER — GADOBUTROL 1 MMOL/ML IV SOLN
7.0000 mL | Freq: Once | INTRAVENOUS | Status: AC | PRN
  Administered 2020-04-27: 7 mL via INTRAVENOUS

## 2020-04-27 NOTE — Discharge Instructions (Addendum)
Follow-up with your primary doctor next week, and return to the ER if your symptoms significantly worsen or change in the meantime.

## 2020-04-27 NOTE — ED Provider Notes (Signed)
[name] EMERGENC[facility] DEPARTMENT Provider Note   CSN: [id number] Arrival date & time: 04/27/20  1622     History Chief Complaint  Patient presents with  . Altered Mental Status  . Loss of Consciousness    Ashley [name] is a 54 y.o. female.  Patient is a 54 year old female with past medical history of diabetes, GERD, anxiety presenting for evaluation of syncope and altered mental status.  According to the patient, she woke up this morning feeling fine.  She went about her normal morning routine without difficulty.  At approximately 2 PM, she describes being in the yard when she had some sort of syncopal event that was witnessed by her daughter who in turn called 911 and the patient was transported here.  She describes an episode of unconsciousness followed by the development of weakness and numbness in her right arm and leg.  She denies headache or visual disturbances.  There was no reported seizure activity, no oral trauma, and no loss of bowel or bladder continence.  The history is provided by the patient.  Altered Mental Status Severity:  Moderate Most recent episode:  Today Episode history:  Single Timing:  Constant Progression:  Unchanged Chronicity:  New      Past Medical History:  Diagnosis Date  . Anxiety   . Atypical ductal hyperplasia of left breast   . Bronchitis    uses inhaler if needed for bronchitis, lasted used -2014  . Cataract    bilateral  . Chronic headaches    migraines - in past, uses Phenergan for nausea   . COPD (chronic obstructive pulmonary disease) ([place])   . Deficiency anemia 12/30/2017  . Depression   . Diabetes mellitus without complication ([place])    taken off Metformin since 04/2014, HgbA1C - normal, will follow up with PCP- Dr. Deborra Medina, 07/2014  . GERD (gastroesophageal reflux disease)   . H/O exercise stress test 2011   done at [facility]- told that it was WNL, done due to pt. having panic attacks   . History of blood  transfusion [birth year]   at birth in [place], [place], unsure number of units  . PONV (postoperative nausea and vomiting)   . Poor dentition    very poor oral health   . SVD (spontaneous vaginal delivery)    x 3  . Tobacco abuse   . Wears glasses     Patient Active Problem List   Diagnosis Date Noted  . Cough 11/20/2019  . Wheezing 11/20/2019  . Hot flashes, menopausal 11/10/2019  . Bilateral hearing loss 11/10/2019  . Thrombocytosis ([place]) 09/01/2019  . Dietary iron deficiency 09/01/2019  . Abnormal chest x-ray with multiple lung nodules 03/04/2019  . Lumbar radiculitis 02/10/2019  . Gastroesophageal reflux disease with esophagitis 02/10/2019  . Essential hypertension 10/20/2018  . Chronic migraine without aura, with intractable migraine, so stated, with status migrainosus 07/02/2018  . Thiamine deficiency 01/05/2018  . Constipation 09/12/2017  . Moderate episode of recurrent major depressive disorder ([place]) 05/16/2017  . Diabetes mellitus with complication ([place])   . 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 Tobacco abuse 01/08/2017  . Chronic bronchitis ([place]) 10/20/2015  . Dyslipidemia, goal LDL below 70 04/12/2015  . Migraine with aura, intractable 08/12/2014  . Routine general medical examination at a health care facility 05/24/2011  . Anxiety state 09/30/2009  . INSOMNIA, CHRONIC 09/30/2009    Past Surgical History:  Procedure Laterality Date  . ABDOMINAL HYSTERECTOMY    . APPENDECTOMY  1988  .  BREAST LUMPECTOMY WITH RADIOACTIVE SEED LOCALIZATION Left 03/17/2020   Procedure: LEFT BREAST LUMPECTOMY WITH RADIOACTIVE SEED LOCALIZATION;  Surgeon: Coralie Keens, MD;  Location: [facility];  Service: General;  Laterality: Left;  LMA  . CARDIAC CATHETERIZATION N/A 08/17/2016   Procedure: Left Heart Cath and Coronary Angiography;  Surgeon: Burnell Blanks, MD;  Location: [facility];  Service: Cardiovascular;  Laterality: N/A;  . CHOLECYSTECTOMY N/A 06/07/2014   Procedure:  LAPAROSCOPIC CHOLECYSTECTOMY;  Surgeon: Ralene Ok, MD;  Location: [facility];  Service: General;  Laterality: N/A;  . CHOLECYSTECTOMY  June 07 2014  . COLONOSCOPY  02/16/2014   normal   . ESOPHAGOGASTRODUODENOSCOPY  12/28/2013  . KNEE ARTHROSCOPY  1995   left  . LAPAROSCOPIC ASSISTED VAGINAL HYSTERECTOMY N/A 04/15/2014   Procedure: LAPAROSCOPIC ASSISTED VAGINAL HYSTERECTOMY;  Surgeon: Marylynn Pearson, MD;  Location: [facility];  Service: Gynecology;  Laterality: N/A;  . LAPAROSCOPIC BILATERAL SALPINGO OOPHERECTOMY Bilateral 04/15/2014   Procedure: LAPAROSCOPIC BILATERAL SALPINGO OOPHORECTOMY;  Surgeon: Marylynn Pearson, MD;  Location: [facility];  Service: Gynecology;  Laterality: Bilateral;  . TUBAL LIGATION       OB History   No obstetric history on file.     Family History  Problem Relation Age of Onset  . Diabetes Mother   . Hyperlipidemia Mother   . Hypertension Mother   . Anxiety disorder Mother   . Depression Mother   . Drug abuse Mother   . Stroke Mother        x3  . Prostate cancer Father   . Alcohol abuse Father   . Lung cancer Maternal Grandfather        smoked  . Emphysema Maternal Grandfather        smoked  . COPD Maternal Grandmother        never smoked   . Dementia Maternal Grandmother   . Thyroid cancer Paternal Aunt   . Colon cancer Neg Hx   . Rectal cancer Neg Hx   . Stomach cancer Neg Hx   . Migraines Neg Hx     Social History   Tobacco Use  . Smoking status: Current Every Day Smoker    Packs/day: 1.50    Years: 35.00    Pack years: 52.50    Types: Cigarettes  . Smokeless tobacco: Never Used  Substance Use Topics  . Alcohol use: No    Alcohol/week: 0.0 standard drinks  . Drug use: No    Home Medications Prior to Admission medications   Medication Sig Start Date End Date Taking? Authorizing Provider  albuterol (VENTOLIN HFA) 108 (90 Base) MCG/ACT inhaler INHALE 2 PUFFS INTO THE LUNGS EVERY 6 HOURS AS NEEDED FOR WHEEZING OR SHORTNESS OF  BREATH Patient taking differently: Inhale 2 puffs into the lungs every 6 (six) hours as needed for wheezing or shortness of breath.  01/15/20   Janith Lima, MD  AQUALANCE LANCETS 30G MISC  11/27/17   [provider]  aspirin EC 81 MG tablet Take 1 tablet (81 mg total) by mouth daily. Patient taking differently: Take 81 mg by mouth at bedtime.  10/20/18   Janith Lima, MD  BAYER CONTOUR TEST test strip Testing for up to tid 03/12/18   Janith Lima, MD  Botulinum Toxin Type A (BOTOX) 200 units SOLR INJECT 155 UNITS IN THE MUSCLE INTO HEAD AND NECK MUSCLES EVERY 3 MONTHS BY PROVIDER. DISCARD REMAINDER. Patient taking differently: Inject 155 Units as directed See admin instructions. IN THE MUSCLE INTO HEAD  AND NECK MUSCLES EVERY 3 MONTHS BY PROVIDER. DISCARD REMAINDER. 02/11/20   Melvenia Beam, MD  cholecalciferol (VITAMIN D3) 25 MCG (1000 UT) tablet Take 2,000 Units by mouth daily.    [provider]  clonazePAM (KLONOPIN) 1 MG tablet TAKE 1 TABLET(1 MG) BY MOUTH TWICE DAILY Patient taking differently: Take 1 mg by mouth 2 (two) times daily.  03/02/20   Janith Lima, MD  dicyclomine (BENTYL) 10 MG capsule Take 1 capsule (10 mg total) by mouth 2 (two) times daily as needed (Cramping and abdominal pain). Patient taking differently: Take 10 mg by mouth in the morning and at bedtime.  11/03/19   Janith Lima, MD  estradiol (CLIMARA - DOSED IN MG/24 HR) 0.0375 mg/24hr patch APPLY 1 PATCH(0.038 MG) EXTERNALLY TO THE SKIN 1 TIME A WEEK 04/25/20   Janith Lima, MD  etodolac (LODINE) 400 MG tablet TAKE 1 TABLET(400 MG) BY MOUTH TWICE DAILY AS NEEDED FOR BACK PAIN Patient taking differently: Take 400 mg by mouth 2 (two) times daily as needed for mild pain (Back Pain).  01/28/20   Hilts, Michael, MD  FEROSUL 325 (65 Fe) MG tablet TAKE 1 TABLET(325 MG) BY MOUTH TWICE DAILY WITH A MEAL 03/21/20   Janith Lima, MD  fludrocortisone (FLORINEF) 0.1 MG tablet TAKE 1 TABLET(0.1 MG) BY  MOUTH DAILY 04/15/20   Janith Lima, MD  Fremanezumab-vfrm ([name]) 225 MG/1.5ML SOSY Inject 225 mg into the skin every 30 (thirty) days. 08/19/19   Melvenia Beam, MD  Insulin Pen Needle 32G X 6 MM MISC Use daily as directed 05/12/18   Marrian Salvage, FNP  Lancet Devices (ADJUSTABLE LANCING DEVICE) MISC Up to tid testing 09/19/17   [provider]  LEVEMIR 100 UNIT/ML injection INJECT 0.3 MLS OR 30 UNITS INTO THE SKIN DAILY 04/21/20   Janith Lima, MD  nitroGLYCERIN (NITROSTAT) 0.4 MG SL tablet Place 1 tablet (0.4 mg total) under the tongue every 5 (five) minutes x 3 doses as needed for chest pain. 02/18/19   Elodia Florence., MD  oxyCODONE (OXY IR/ROXICODONE) 5 MG immediate release tablet Take 1-2 tablets (5-10 mg total) by mouth every 6 (six) hours as needed for moderate pain or severe pain. 03/17/20   Coralie Keens, MD  pantoprazole (PROTONIX) 40 MG tablet TAKE 1 TABLET(40 MG) BY MOUTH DAILY Patient taking differently: Take 40 mg by mouth daily.  01/25/20   Janith Lima, MD  promethazine (PHENERGAN) 25 MG tablet TAKE 1 TABLET(25 MG) BY MOUTH EVERY 6 HOURS AS NEEDED FOR NAUSEA OR VOMITING 04/26/20   Janith Lima, MD  Rimegepant Sulfate (NURTEC) 75 MG TBDP Take 75 mg by mouth daily as needed. For migraines. Take as close to onset of migraine as possible. One daily maximum. Patient taking differently: Take 75 mg by mouth daily as needed (For migraines). For migraines. Take as close to onset of migraine as possible. One daily maximum. 01/19/20   Melvenia Beam, MD  rosuvastatin (CRESTOR) 20 MG tablet TAKE 1 TABLET BY MOUTH EVERY DAY, NEED OFFICE VISIT FOR MORE REFILLS Patient taking differently: Take 20 mg by mouth daily.  01/09/20   Janith Lima, MD  traZODone (DESYREL) 150 MG tablet TAKE 1 TABLET(150 MG) BY MOUTH AT BEDTIME Patient taking differently: Take 150 mg by mouth at bedtime.  01/19/20   Janith Lima, MD  TRULICITY [id number] SOPN INJECT ONCE WEEKLY AS  DIRECTED Patient taking differently: Inject 75 mg into the  skin once a week. Monday 11/06/19   Janith Lima, MD  venlafaxine XR (EFFEXOR-XR) 150 MG 24 hr capsule Take 1 capsule (150 mg total) by mouth daily with breakfast. Patient taking differently: Take 150 mg by mouth daily.  01/19/20   Melvenia Beam, MD  estradiol (CLIMARA - DOSED IN MG/24 HR) 0.0375 mg/24hr patch Place 1 patch (0.0375 mg total) onto the skin once a week. 11/10/19   Janith Lima, MD  pantoprazole (PROTONIX) 40 MG tablet Take 1 tablet (40 mg total) by mouth daily. 08/04/19   Janith Lima, MD    Allergies    Penicillins, Topamax [topiramate], and Metformin and related  Review of Systems   Review of Systems  All other systems reviewed and are negative.   Physical Exam Updated Vital Signs LMP 02/12/2014   SpO2 100%   Physical Exam Vitals and nursing note reviewed.  Constitutional:      General: She is not in acute distress.    Appearance: She is well-developed. She is not diaphoretic.  HENT:     Head: Normocephalic and atraumatic.  Eyes:     Extraocular Movements: Extraocular movements intact.     Pupils: Pupils are equal, round, and reactive to light.  Cardiovascular:     Rate and Rhythm: Normal rate and regular rhythm.     Heart sounds: No murmur. No friction rub. No gallop.   Pulmonary:     Effort: Pulmonary effort is normal. No respiratory distress.     Breath sounds: Normal breath sounds. No wheezing.  Abdominal:     General: Bowel sounds are normal. There is no distension.     Palpations: Abdomen is soft.     Tenderness: There is no abdominal tenderness.  Musculoskeletal:        General: Normal range of motion.     Cervical back: Normal range of motion and neck supple.  Skin:    General: Skin is warm and dry.  Neurological:     Mental Status: She is alert and oriented to person, place, and time.     Cranial Nerves: No cranial nerve deficit.     Motor: Weakness present.      Coordination: Coordination normal.     Comments: There is 4+ out of 5 strength in the right upper and right lower extremity.  Strength is 5 out of 5 in the left arm and leg.  There is no facial droop and cranial nerves are intact.     ED Results / Procedures / Treatments   Labs (all labs ordered are listed, but only abnormal results are displayed) Labs Reviewed  CBG MONITORING, ED - Abnormal; Notable for the following components:      Result Value   Glucose-Capillary 162 (*)    All other components within normal limits    EKG None  Radiology No results found.  Procedures Procedures (including critical care time)  Medications Ordered in ED Medications - No data to display  ED Course  I have reviewed the triage vital signs and the nursing notes.  Pertinent labs & imaging results that were available during my care of the patient were reviewed by me and considered in my medical decision making (see chart for details).    MDM Rules/Calculators/A&P  Patient presents here as a code stroke after an episode of unresponsiveness followed by numbness in the right arm and right leg.  She was seen by neurology and has gone for CT scan of the head.  This  was reported as negative.  Per neurology recommendations, patient then underwent a work-up including MRI of the brain, cervical spine, and thoracic spine.  These have all returned and are essentially unremarkable with the exception of insignificant disc disease.  At this point, patient appears appropriate for discharge.  Results of the test explained to her and she is to follow-up with her primary doctor.  CRITICAL CARE Performed by: Veryl Speak Total critical care time: 45 minutes Critical care time was exclusive of separately billable procedures and treating other patients. Critical care was necessary to treat or prevent imminent or life-threatening deterioration. Critical care was time spent personally by me on the following  activities: development of treatment plan with patient and/or surrogate as well as nursing, discussions with consultants, evaluation of patient's response to treatment, examination of patient, obtaining history from patient or surrogate, ordering and performing treatments and interventions, ordering and review of laboratory studies, ordering and review of radiographic studies, pulse oximetry and re-evaluation of patient's condition.   Final Clinical Impression(s) / ED Diagnoses Final diagnoses:  None    Rx / DC Orders ED Discharge Orders    None       Veryl Speak, MD 04/27/20 2159

## 2020-04-27 NOTE — ED Notes (Signed)
Patient verbalizes understanding of discharge instructions. Opportunity for questioning and answers were provided. Armband removed by staff, pt discharged from ED and transported to lobby via wheelchair to go home with spouse.

## 2020-04-27 NOTE — Consult Note (Addendum)
Neurology Consultation  Reason for Consult: Code Stroke  Referring Physician: Dr. Stark Jock  CC: Right-sided weakness and syncope  History is obtained from: Patient  HPI: Ashley [name] is a 54 y.o. female with history of diabetes, depression, chronic headaches, anxiety and tobacco abuse.  Patient came to the hospital secondary to having 2 syncopal episodes.  After the second syncopal episode patient called her daughter and she was brought to the emergency department.  While in the emergency department she was complaining of right-sided weakness.  For that reason a code stroke was called.  On initial interview the patient was alert and oriented.  She complained of right-sided weakness. She states that she has had right leg weakness for about a year and has had recurrent falls for about a year. She denies any prolonged period of time during which she was confused after she had a syncopal event.  ED course   CT head: Normal with no bleed or intracranial abnormalities  Chart review: Patient was seen on 10/01/2017 for similar presentation of sudden onset right arm and leg weakness and tremor.  At that time she had an NIH stroke scale of 3.  Stat MRI was obtained which showed no intracranial abnormalities.  She is seen by Dr. Jaynee Eagles as an outpatient for migraines.  6 months ago she did have an MRI of the cervical spine which at that time showed no cord signal abnormality.  LKW: 11 AM tpa given?: no, out of window Premorbid modified Rankin scale (mRS): 0 NIH stroke score: 2   Past Medical History:  Diagnosis Date  . Anxiety   . Atypical ductal hyperplasia of left breast   . Bronchitis    uses inhaler if needed for bronchitis, lasted used -2014  . Cataract    bilateral  . Chronic headaches    migraines - in past, uses Phenergan for nausea   . COPD (chronic obstructive pulmonary disease) ([facility])   . Deficiency anemia 12/30/2017  . Depression   . Diabetes mellitus without complication ([place])    taken  off Metformin since 04/2014, HgbA1C - normal, will follow up with PCP- Dr. Deborra Medina, 07/2014  . GERD (gastroesophageal reflux disease)   . H/O exercise stress test 2011   done at [facility]- told that it was WNL, done due to pt. having panic attacks   . History of blood transfusion 11[birth year]25[birth year]1966   at birth in [place], [place], unsure number of units  . PONV (postoperative nausea and vomiting)   . Poor dentition    very poor oral health   . SVD (spontaneous vaginal delivery)    x 3  . Tobacco abuse   . Wears glasses      Family History  Problem Relation Age of Onset  . Diabetes Mother   . Hyperlipidemia Mother   . Hypertension Mother   . Anxiety disorder Mother   . Depression Mother   . Drug abuse Mother   . Stroke Mother        x3  . Prostate cancer Father   . Alcohol abuse Father   . Lung cancer Maternal Grandfather        smoked  . Emphysema Maternal Grandfather        smoked  . COPD Maternal Grandmother        never smoked   . Dementia Maternal Grandmother   . Thyroid cancer Paternal Aunt   . Colon cancer Neg Hx   . Rectal cancer Neg Hx   . Stomach  cancer Neg Hx   . Migraines Neg Hx     Social History:   reports that she has been smoking cigarettes. She has a 52.50 pack-year smoking history. She has never used smokeless tobacco. She reports that she does not drink alcohol or use drugs.  Medications No current facility-administered medications for this encounter.  Current Outpatient Medications:  .  albuterol (VENTOLIN HFA) 108 (90 Base) MCG/ACT inhaler, INHALE 2 PUFFS INTO THE LUNGS EVERY 6 HOURS AS NEEDED FOR WHEEZING OR SHORTNESS OF BREATH (Patient taking differently: Inhale 2 puffs into the lungs every 6 (six) hours as needed for wheezing or shortness of breath. ), Disp: 6.7 g, Rfl: 3 .  AQUALANCE LANCETS 30G MISC, , Disp: , Rfl: 98 .  aspirin EC 81 MG tablet, Take 1 tablet (81 mg total) by mouth daily. (Patient taking differently: Take 81 mg by mouth at bedtime. ), Disp: 90  tablet, Rfl: 1 .  BAYER CONTOUR TEST test strip, Testing for up to tid, Disp: 100 each, Rfl: 98 .  Botulinum Toxin Type A (BOTOX) 200 units SOLR, INJECT 155 UNITS IN THE MUSCLE INTO HEAD AND NECK MUSCLES EVERY 3 MONTHS BY PROVIDER. DISCARD REMAINDER. (Patient taking differently: Inject 155 Units as directed See admin instructions. IN THE MUSCLE INTO HEAD AND NECK MUSCLES EVERY 3 MONTHS BY PROVIDER. DISCARD REMAINDER.), Disp: 1 each, Rfl: 0 .  cholecalciferol (VITAMIN D3) 25 MCG (1000 UT) tablet, Take 2,000 Units by mouth daily., Disp: , Rfl:  .  clonazePAM (KLONOPIN) 1 MG tablet, TAKE 1 TABLET(1 MG) BY MOUTH TWICE DAILY (Patient taking differently: Take 1 mg by mouth 2 (two) times daily. ), Disp: 60 tablet, Rfl: 1 .  dicyclomine (BENTYL) 10 MG capsule, Take 1 capsule (10 mg total) by mouth 2 (two) times daily as needed (Cramping and abdominal pain). (Patient taking differently: Take 10 mg by mouth in the morning and at bedtime. ), Disp: 180 capsule, Rfl: 1 .  estradiol (CLIMARA - DOSED IN MG/24 HR) 0.0375 mg/24hr patch, APPLY 1 PATCH(0.038 MG) EXTERNALLY TO THE SKIN 1 TIME A WEEK, Disp: 12 patch, Rfl: 0 .  etodolac (LODINE) 400 MG tablet, TAKE 1 TABLET(400 MG) BY MOUTH TWICE DAILY AS NEEDED FOR BACK PAIN (Patient taking differently: Take 400 mg by mouth 2 (two) times daily as needed for mild pain (Back Pain). ), Disp: 60 tablet, Rfl: 5 .  FEROSUL 325 (65 Fe) MG tablet, TAKE 1 TABLET(325 MG) BY MOUTH TWICE DAILY WITH A MEAL, Disp: 180 tablet, Rfl: 1 .  fludrocortisone (FLORINEF) 0.1 MG tablet, TAKE 1 TABLET(0.1 MG) BY MOUTH DAILY, Disp: 90 tablet, Rfl: 0 .  Fremanezumab-vfrm ([name]) 225 MG/1.5ML SOSY, Inject 225 mg into the skin every 30 (thirty) days., Disp: 1.5 mL, Rfl: 11 .  Insulin Pen Needle 32G X 6 MM MISC, Use daily as directed, Disp: 100 each, Rfl: 1 .  Lancet Devices (ADJUSTABLE LANCING DEVICE) MISC, Up to tid testing, Disp: , Rfl: 98 .  LEVEMIR 100 UNIT/ML injection, INJECT 0.3 MLS OR 30 UNITS  INTO THE SKIN DAILY, Disp: 10 mL, Rfl: 0 .  nitroGLYCERIN (NITROSTAT) 0.4 MG SL tablet, Place 1 tablet (0.4 mg total) under the tongue every 5 (five) minutes x 3 doses as needed for chest pain., Disp: 30 tablet, Rfl: 5 .  oxyCODONE (OXY IR/ROXICODONE) 5 MG immediate release tablet, Take 1-2 tablets (5-10 mg total) by mouth every 6 (six) hours as needed for moderate pain or severe pain., Disp: 20 tablet, Rfl: 0 .  pantoprazole (PROTONIX) 40 MG tablet, TAKE 1 TABLET(40 MG) BY MOUTH DAILY (Patient taking differently: Take 40 mg by mouth daily. ), Disp: 90 tablet, Rfl: 1 .  promethazine (PHENERGAN) 25 MG tablet, TAKE 1 TABLET(25 MG) BY MOUTH EVERY 6 HOURS AS NEEDED FOR NAUSEA OR VOMITING, Disp: 30 tablet, Rfl: 1 .  Rimegepant Sulfate (NURTEC) 75 MG TBDP, Take 75 mg by mouth daily as needed. For migraines. Take as close to onset of migraine as possible. One daily maximum. (Patient taking differently: Take 75 mg by mouth daily as needed (For migraines). For migraines. Take as close to onset of migraine as possible. One daily maximum.), Disp: 10 tablet, Rfl: 6 .  rosuvastatin (CRESTOR) 20 MG tablet, TAKE 1 TABLET BY MOUTH EVERY DAY, NEED OFFICE VISIT FOR MORE REFILLS (Patient taking differently: Take 20 mg by mouth daily. ), Disp: 90 tablet, Rfl: 1 .  traZODone (DESYREL) 150 MG tablet, TAKE 1 TABLET(150 MG) BY MOUTH AT BEDTIME (Patient taking differently: Take 150 mg by mouth at bedtime. ), Disp: 90 tablet, Rfl: 1 .  TRULICITY [id number] SOPN, INJECT ONCE WEEKLY AS DIRECTED (Patient taking differently: Inject 75 mg into the skin once a week. Monday), Disp: 2 mL, Rfl: 5 .  venlafaxine XR (EFFEXOR-XR) 150 MG 24 hr capsule, Take 1 capsule (150 mg total) by mouth daily with breakfast. (Patient taking differently: Take 150 mg by mouth daily. ), Disp: 90 capsule, Rfl: 3  ROS:     General ROS: negative for -  weight gain or weight loss Psychological ROS: negative for - behavioral disorder,  hallucinations Ophthalmic ROS: negative for - blurry vision, double vision ENT ROS: negative for - , sore throat, tinnitus or vertigo Respiratory ROS: negative for - , shortness of breath or wheezing Cardiovascular ROS: negative for - chest pain, dyspnea on exertion, edema or irregular heartbeat Gastrointestinal ROS: negative for - abdominal pain, diarrhea, hematemesis, nausea/vomiting or stool incontinence Genito-Urinary ROS: negative for - dysuria, hematuria, incontinence or urinary frequency/urgency Musculoskeletal ROS: Positive for -  muscular weakness Neurological ROS: as noted in HPI Dermatological ROS: negative for rash and skin lesion changes  Exam: Current vital signs: LMP 02/12/2014   SpO2 98%  Vital signs in last 24 hours: SpO2:  [98 %-100 %] 98 % (05/05 1633)   Constitutional: Appears well-developed and well-nourished.  Psych: Affect appropriate to situation Eyes: No scleral injection HENT: No OP obstrucion Head: Normocephalic.  Cardiovascular: Normal rate and regular rhythm.  Respiratory: Effort normal, non-labored breathing GI: Soft.  No distension. There is no tenderness.  Skin: WDI  Neuro: Mental Status: Patient is awake, alert, oriented to person, place, month, year, and situation. Speech-no aphasia, dysarthria.  Intact naming, repeating, comprehension.  Able to follow commands with no difficulty.  Able to give a coherent history. Cranial Nerves: II: Visual Fields are full. PERRL.  III,IV, VI: EOMI without ptosis or diplopia.  V: Facial sensation is symmetric to temperature VII: Facial movement is symmetric.  VIII: hearing is intact to voice X: Palate elevates symmetrically XI: Shoulder shrug is symmetric. XII: tongue is midline without atrophy or fasciculations.  Motor: Patient has maximum of 5/5 strength elicited throughout, with giveaway weakness of the right arm and leg.  She also has increased tone of the right upper extremity. Drift positive in the  right upper extremity and lower extremity. Patient has positive Hoffmann sign bilateral hands Sensory: On the right foot she has no proprioception at her toe, no vibratory sensation at her toe, she has  decreased sensation to pinprick and light touch up to mid calf.   On the left she has mildly impaired proprioception, decreased vibratory sensation and no significant deficit in pinprick or light touch. Upper extremities with normal FT, temp and vibratory sensation.  Deep Tendon Reflexes: 2+ at the ankles 2+ at the knees and 2+ at the biceps bilaterally Plantars: Toes are downgoing bilaterally.  Cerebellar: FNF intact bilaterally  Labs I have reviewed labs in epic and the results pertinent to this consultation are:  CBC    Component Value Date/Time   WBC 5.0 04/27/2020 1636   RBC 4.40 04/27/2020 1636   HGB 13.9 04/27/2020 1636   HCT 41.2 04/27/2020 1636   PLT 281 04/27/2020 1636   MCV 93.6 04/27/2020 1636   MCH 31.6 04/27/2020 1636   MCHC 33.7 04/27/2020 1636   RDW 13.9 04/27/2020 1636   LYMPHSABS 1.9 04/27/2020 1636   MONOABS 0.7 04/27/2020 1636   EOSABS 0.2 04/27/2020 1636   BASOSABS 0.1 04/27/2020 1636    CMP     Component Value Date/Time   NA 139 03/10/2020 0946   K 3.5 03/10/2020 0946   CL 104 03/10/2020 0946   CO2 22 03/10/2020 0946   GLUCOSE 160 (H) 03/10/2020 0946   BUN 7 03/10/2020 0946   CREATININE 0.72 03/10/2020 0946   CREATININE 1.13 (H) 08/15/2016 1107   CALCIUM 9.0 03/10/2020 0946   PROT 7.1 07/28/2019 1332   ALBUMIN 4.4 07/28/2019 1332   AST 23 07/28/2019 1332   ALT 32 07/28/2019 1332   ALKPHOS 76 07/28/2019 1332   BILITOT 0.3 07/28/2019 1332   GFRNONAA >60 03/10/2020 0946   GFRAA >60 03/10/2020 0946    Lipid Panel     Component Value Date/Time   CHOL 102 09/01/2019 1523   TRIG 91.0 09/01/2019 1523   HDL 43.70 09/01/2019 1523   CHOLHDL 2 09/01/2019 1523   VLDL 18.2 09/01/2019 1523   LDLCALC 40 09/01/2019 1523   LDLDIRECT 152.1 01/19/2013  0905     Imaging I have reviewed the images obtained:  CT-scan of the brain-shows no intracranial abnormalities.  Etta Quill PA-C [facility] [phone] 04/27/2020, 4:57 PM   Assessment: 54 year old female who presented to the hospital following 2 episodes of syncope. On interview, she endorses frequent falls over the past year due to transient spells of right worse than left lower extremity weakness.  1. As noted above she has maximum of 5/5 strength in all 4 extremities, but exam is confounded by RUE and RLE giveway weakness.   2. Possible etiologies for her stated transient spells of asymmetric BLE weakness resulting if falls include spinal stenosis at the thoracic or lumbar levels. TIA presenting in identical fashion numerous times over the course of one year would be unlikely.  3. Potential etiologies for her 2 syncopal events include cardiac arrhythmia and orthostatic hypotension.  4. In the context of the potential non-physiological aspects of her exam, she does have increased tone in her right arm in addition to positive Hoffmann's sign bilaterally. Differential includes possible stenosis versus demyelinating disease.  Impression: -Syncopal events x 2 -Physiological versus non-physiological right-sided weakness  Recommendations: -MRI of cervical spine, thoracic spine and brain with and without contrast -Physical therapy and Occupational Therapy  I have seen and examined the patient. I have formulated the assessment and recommendations. My exam findings were observed and documented by Etta Quill, PA.  Electronically signed: Dr. Kerney Elbe

## 2020-04-27 NOTE — ED Triage Notes (Signed)
PT BIB [facility] after loosing consciousness while in the yard. EMS reports pt was laying on the ground upon arrival. PT B/P while supine was 140/70 and then 128/80 when in sitting position. PT also c/o of tingling to right UE and facial numbness. LNW 11 AM.

## 2020-04-28 MED ORDER — TRULICITY 0.75 MG/0.5ML ~~LOC~~ SOAJ: 1.0000 "pen " | SUBCUTANEOUS | 0 refills | Status: DC

## 2020-04-28 NOTE — Addendum Note (Signed)
Addended by: Aviva Signs M on: 04/28/2020 02:16 PM   Modules accepted: Orders

## 2020-04-28 NOTE — Telephone Encounter (Signed)
Pt rx has been sent. Pt is to follow up on May 17th. No additional refills be sent until patient is seen.

## 2020-05-02 ENCOUNTER — Other Ambulatory Visit: Payer: Self-pay | Admitting: Internal Medicine

## 2020-05-02 DIAGNOSIS — F411 Generalized anxiety disorder: Secondary | ICD-10-CM

## 2020-05-02 NOTE — Telephone Encounter (Signed)
Done erx 

## 2020-05-09 ENCOUNTER — Ambulatory Visit: Payer: 59 | Admitting: Internal Medicine

## 2020-05-09 ENCOUNTER — Encounter: Payer: Self-pay | Admitting: Internal Medicine

## 2020-05-09 ENCOUNTER — Other Ambulatory Visit: Payer: Self-pay

## 2020-05-09 VITALS — BP 146/78 | HR 71 | Temp 97.8°F | Resp 16 | Ht 62.0 in | Wt 158.0 lb

## 2020-05-09 DIAGNOSIS — R002 Palpitations: Secondary | ICD-10-CM | POA: Diagnosis not present

## 2020-05-09 DIAGNOSIS — E118 Type 2 diabetes mellitus with unspecified complications: Secondary | ICD-10-CM | POA: Diagnosis not present

## 2020-05-09 DIAGNOSIS — I10 Essential (primary) hypertension: Secondary | ICD-10-CM | POA: Diagnosis not present

## 2020-05-09 DIAGNOSIS — R55 Syncope and collapse: Secondary | ICD-10-CM

## 2020-05-09 DIAGNOSIS — E519 Thiamine deficiency, unspecified: Secondary | ICD-10-CM | POA: Diagnosis not present

## 2020-05-09 DIAGNOSIS — E611 Iron deficiency: Secondary | ICD-10-CM

## 2020-05-09 DIAGNOSIS — E876 Hypokalemia: Secondary | ICD-10-CM

## 2020-05-09 LAB — CBC WITH DIFFERENTIAL/PLATELET
Basophils Absolute: 0.1 10*3/uL (ref 0.0–0.1)
Basophils Relative: 0.6 % (ref 0.0–3.0)
Eosinophils Absolute: 0.2 10*3/uL (ref 0.0–0.7)
Eosinophils Relative: 1.7 % (ref 0.0–5.0)
HCT: 42.5 % (ref 36.0–46.0)
Hemoglobin: 14.6 g/dL (ref 12.0–15.0)
Lymphocytes Relative: 25.6 % (ref 12.0–46.0)
Lymphs Abs: 2.4 10*3/uL (ref 0.7–4.0)
MCHC: 34.5 g/dL (ref 30.0–36.0)
MCV: 93.7 fl (ref 78.0–100.0)
Monocytes Absolute: 0.7 10*3/uL (ref 0.1–1.0)
Monocytes Relative: 7.6 % (ref 3.0–12.0)
Neutro Abs: 6 10*3/uL (ref 1.4–7.7)
Neutrophils Relative %: 64.5 % (ref 43.0–77.0)
Platelets: 366 10*3/uL (ref 150.0–400.0)
RBC: 4.53 Mil/uL (ref 3.87–5.11)
RDW: 13.8 % (ref 11.5–15.5)
WBC: 9.3 10*3/uL (ref 4.0–10.5)

## 2020-05-09 LAB — BASIC METABOLIC PANEL
BUN: 8 mg/dL (ref 6–23)
CO2: 30 mEq/L (ref 19–32)
Calcium: 9 mg/dL (ref 8.4–10.5)
Chloride: 102 mEq/L (ref 96–112)
Creatinine, Ser: 0.65 mg/dL (ref 0.40–1.20)
GFR: 94.87 mL/min (ref >60.00)
Glucose, Bld: 150 mg/dL — ABNORMAL HIGH (ref 70–99)
Potassium: 3.2 mEq/L — ABNORMAL LOW (ref 3.5–5.1)
Sodium: 139 mEq/L (ref 135–145)

## 2020-05-09 LAB — IBC PANEL
Iron: 39 ug/dL — ABNORMAL LOW (ref 42–145)
Saturation Ratios: 11.9 % — ABNORMAL LOW (ref 20.0–50.0)
Transferrin: 234 mg/dL (ref 212.0–360.0)

## 2020-05-09 LAB — MICROALBUMIN / CREATININE URINE RATIO
Creatinine,U: 62.9 mg/dL
Microalb Creat Ratio: 1.1 mg/g (ref 0.0–30.0)
Microalb, Ur: <0.7 mg/dL (ref 0.0–1.9)

## 2020-05-09 LAB — MAGNESIUM: Magnesium: 2 mg/dL (ref 1.5–2.5)

## 2020-05-09 MED ORDER — POTASSIUM CHLORIDE CRYS ER 20 MEQ PO TBCR: 20.0000 meq | EXTENDED_RELEASE_TABLET | Freq: Two times a day (BID) | ORAL | 0 refills | Status: DC

## 2020-05-09 NOTE — Patient Instructions (Signed)

## 2020-05-09 NOTE — Progress Notes (Signed)
Subjective:  Patient ID: Ashley [name], female    DOB: 08-23-1966  Age: 54 y.o. MRN: [id number]  CC: Palpitations, Hypertension, and Diabetes  This visit occurred during the SARS-CoV-2 public health emergency.  Safety protocols were in place, including screening questions prior to the visit, additional usage of staff PPE, and extensive cleaning of exam room while observing appropriate contact time as indicated for disinfecting solutions.    HPI Ashley [name] presents for f/up - She had a syncopal episode about a week ago.  She says that her legs felt weak and wobbly and she went down.  She also tells me that she has had a 33-month history of palpitations that she describes as an intermittent fluttering sensation in her chest.  She has symptoms with the palpitations including near syncope, dizziness, and lightheadedness.  She has chronic unchanged shortness of breath.  She denies any recent episodes of cough, chest pain, diaphoresis, or edema.  She complains of weight gain.  She tells me her blood sugars have been well controlled.  Outpatient Medications Prior to Visit  Medication Sig Dispense Refill   albuterol (VENTOLIN HFA) 108 (90 Base) MCG/ACT inhaler INHALE 2 PUFFS INTO THE LUNGS EVERY 6 HOURS AS NEEDED FOR WHEEZING OR SHORTNESS OF BREATH (Patient taking differently: Inhale 2 puffs into the lungs every 6 (six) hours as needed for wheezing or shortness of breath. ) 6.7 g 3   AQUALANCE LANCETS 30G MISC   98   aspirin EC 81 MG tablet Take 1 tablet (81 mg total) by mouth daily. (Patient taking differently: Take 81 mg by mouth at bedtime. ) 90 tablet 1   BAYER CONTOUR TEST test strip Testing for up to tid 100 each 98   cholecalciferol (VITAMIN D3) 25 MCG (1000 UT) tablet Take 2,000 Units by mouth daily.     clonazePAM (KLONOPIN) 1 MG tablet TAKE 1 TABLET(1 MG) BY MOUTH TWICE DAILY 60 tablet 0   dicyclomine (BENTYL) 10 MG capsule Take 1 capsule (10 mg total) by mouth 2 (two) times daily as  needed (Cramping and abdominal pain). (Patient taking differently: Take 10 mg by mouth in the morning and at bedtime. ) 180 capsule 1   Dulaglutide (TRULICITY) A999333 [id number] SOPN Inject 1 pen into the skin once a week. 4 mL 0   estradiol (CLIMARA - DOSED IN MG/24 HR) 0.0375 mg/24hr patch APPLY 1 PATCH(0.038 MG) EXTERNALLY TO THE SKIN 1 TIME A WEEK (Patient taking differently: Place 0.0375 mg onto the skin once a week. ) 12 patch 0   etodolac (LODINE) 400 MG tablet TAKE 1 TABLET(400 MG) BY MOUTH TWICE DAILY AS NEEDED FOR BACK PAIN (Patient taking differently: Take 400 mg by mouth 2 (two) times daily as needed for mild pain (Back Pain). ) 60 tablet 5   FEROSUL 325 (65 Fe) MG tablet TAKE 1 TABLET(325 MG) BY MOUTH TWICE DAILY WITH A MEAL (Patient taking differently: Take 325 mg by mouth 2 (two) times daily with a meal. ) 180 tablet 1   fludrocortisone (FLORINEF) 0.1 MG tablet TAKE 1 TABLET(0.1 MG) BY MOUTH DAILY (Patient taking differently: Take 0.1 mg by mouth daily. ) 90 tablet 0   Fremanezumab-vfrm ([name]) 225 MG/1.5ML SOSY Inject 225 mg into the skin every 30 (thirty) days. 1.5 mL 11   Insulin Pen Needle 32G X 6 MM MISC Use daily as directed 100 each 1   Lancet Devices (ADJUSTABLE LANCING DEVICE) MISC Up to tid testing  98   LEVEMIR 100 UNIT/ML  injection INJECT 0.3 MLS OR 30 UNITS INTO THE SKIN DAILY (Patient taking differently: Inject 30 Units into the skin daily. ) 10 mL 0   pantoprazole (PROTONIX) 40 MG tablet TAKE 1 TABLET(40 MG) BY MOUTH DAILY (Patient taking differently: Take 40 mg by mouth daily. ) 90 tablet 1   promethazine (PHENERGAN) 25 MG tablet TAKE 1 TABLET(25 MG) BY MOUTH EVERY 6 HOURS AS NEEDED FOR NAUSEA OR VOMITING (Patient taking differently: Take 25 mg by mouth every 6 (six) hours as needed for nausea or vomiting. ) 30 tablet 1   Rimegepant Sulfate (NURTEC) 75 MG TBDP Take 75 mg by mouth daily as needed. For migraines. Take as close to onset of migraine as possible. One  daily maximum. (Patient taking differently: Take 75 mg by mouth daily as needed (For migraines). ) 10 tablet 6   rosuvastatin (CRESTOR) 20 MG tablet TAKE 1 TABLET BY MOUTH EVERY DAY, NEED OFFICE VISIT FOR MORE REFILLS (Patient taking differently: Take 20 mg by mouth daily. ) 90 tablet 1   traZODone (DESYREL) 150 MG tablet TAKE 1 TABLET(150 MG) BY MOUTH AT BEDTIME (Patient taking differently: Take 150 mg by mouth at bedtime. ) 90 tablet 1   venlafaxine XR (EFFEXOR-XR) 150 MG 24 hr capsule Take 1 capsule (150 mg total) by mouth daily with breakfast. (Patient taking differently: Take 150 mg by mouth daily. ) 90 capsule 3   Botulinum Toxin Type A (BOTOX) 200 units SOLR INJECT 155 UNITS IN THE MUSCLE INTO HEAD AND NECK MUSCLES EVERY 3 MONTHS BY PROVIDER. DISCARD REMAINDER. (Patient taking differently: Inject 155 Units into the muscle See admin instructions. Every 3 months.) 1 each 0   nitroGLYCERIN (NITROSTAT) 0.4 MG SL tablet Place 1 tablet (0.4 mg total) under the tongue every 5 (five) minutes x 3 doses as needed for chest pain. (Patient not taking: Reported on 05/09/2020) 30 tablet 5   oxyCODONE (OXY IR/ROXICODONE) 5 MG immediate release tablet Take 1-2 tablets (5-10 mg total) by mouth every 6 (six) hours as needed for moderate pain or severe pain. (Patient not taking: Reported on 04/27/2020) 20 tablet 0   No facility-administered medications prior to visit.    ROS Review of Systems  Constitutional: Positive for unexpected weight change. Negative for appetite change, chills, diaphoresis, fatigue and fever.  HENT: Negative.  Negative for trouble swallowing.   Eyes: Negative.   Respiratory: Positive for shortness of breath. Negative for cough, chest tightness and wheezing.   Cardiovascular: Positive for palpitations. Negative for chest pain and leg swelling.  Gastrointestinal: Negative for abdominal pain, constipation, diarrhea, nausea and vomiting.  Endocrine: Negative.   Genitourinary: Negative.   Negative for difficulty urinating.  Musculoskeletal: Negative.  Negative for arthralgias and myalgias.  Skin: Negative.  Negative for color change and pallor.  Neurological: Positive for dizziness and light-headedness. Negative for seizures, syncope, speech difficulty, weakness, numbness and headaches.  Hematological: Negative for adenopathy. Does not bruise/bleed easily.  Psychiatric/Behavioral: Positive for dysphoric mood. Negative for agitation, behavioral problems, decreased concentration and sleep disturbance. The patient is nervous/anxious.     Objective:  BP (!) 146/78 (BP Location: Left Arm, Patient Position: Sitting, Cuff Size: Normal)    Pulse 71    Temp 97.8 F (36.6 C) (Oral)    Resp 16    Ht 5\' 2"  (1.575 m)    Wt 158 lb (71.7 kg)    LMP 02/12/2014    SpO2 96%    BMI 28.90 kg/m   BP Readings from Last 3  Encounters:  05/09/20 (!) 146/78  04/27/20 (!) 148/77  03/17/20 139/69    Wt Readings from Last 3 Encounters:  05/09/20 158 lb (71.7 kg)  03/17/20 157 lb 8 oz (71.4 kg)  03/10/20 157 lb 8 oz (71.4 kg)    Physical Exam Vitals reviewed.  Constitutional:      Appearance: Normal appearance.  HENT:     Nose: Nose normal.     Mouth/Throat:     Mouth: Mucous membranes are moist.  Eyes:     General: No scleral icterus.    Conjunctiva/sclera: Conjunctivae normal.  Cardiovascular:     Rate and Rhythm: Normal rate and regular rhythm.     Heart sounds: No murmur.     Comments: EKG - NSR, 65 bpm Normal EKG Pulmonary:     Effort: Pulmonary effort is normal.     Breath sounds: No stridor. No wheezing, rhonchi or rales.  Abdominal:     General: Abdomen is flat. There is no distension.     Palpations: There is no hepatomegaly, splenomegaly or mass.     Tenderness: There is no abdominal tenderness. There is no guarding.  Musculoskeletal:        General: Normal range of motion.     Cervical back: Neck supple.     Right lower leg: No edema.     Left lower leg: No edema.    Lymphadenopathy:     Cervical: No cervical adenopathy.  Skin:    General: Skin is warm and dry.     Coloration: Skin is not pale.  Neurological:     General: No focal deficit present.     Mental Status: She is alert and oriented to person, place, and time. Mental status is at baseline.  Psychiatric:        Mood and Affect: Mood normal.        Behavior: Behavior normal.        Thought Content: Thought content normal.        Judgment: Judgment normal.     Lab Results  Component Value Date   WBC 9.3 05/09/2020   HGB 14.6 05/09/2020   HCT 42.5 05/09/2020   PLT 366.0 05/09/2020   GLUCOSE 150 (H) 05/09/2020   CHOL 102 09/01/2019   TRIG 91.0 09/01/2019   HDL 43.70 09/01/2019   LDLDIRECT 152.1 01/19/2013   LDLCALC 40 09/01/2019   ALT 16 04/27/2020   AST 20 04/27/2020   NA 139 05/09/2020   K 3.2 (L) 05/09/2020   CL 102 05/09/2020   CREATININE 0.65 05/09/2020   BUN 8 05/09/2020   CO2 30 05/09/2020   TSH 1.40 12/30/2017   INR 1.0 04/27/2020   HGBA1C 6.4 (H) 03/10/2020   MICROALBUR <0.7 05/09/2020    MR BRAIN W WO CONTRAST  Result Date: 04/27/2020 CLINICAL DATA:  Initial evaluation for right-sided weakness, syncopal episode. Evaluate for possible demyelinating disease. EXAM: MRI HEAD WITHOUT AND WITH CONTRAST MRI CERVICAL SPINE WITHOUT AND WITH CONTRAST MRI THORACIC SPINE WITHOUT AND WITH CONTRAST TECHNIQUE: Multiplanar, multiecho pulse sequences of the brain and surrounding structures, and cervical and thoracic spine, to include the craniocervical junction and cervicothoracic junction, were obtained without and with intravenous contrast. CONTRAST:  60mL GADAVIST GADOBUTROL 1 MMOL/ML IV SOLN COMPARISON:  Prior CT from earlier same day as well as previous brain MRI from 10/02/2017 and cervical spine MRI from 07/16/2019. FINDINGS: MRI HEAD FINDINGS Brain: Cerebral volume within normal limits for patient age. No focal parenchymal signal abnormality identified. No abnormal  foci of  restricted diffusion to suggest acute or subacute ischemia. Gray-white matter differentiation well maintained. No encephalomalacia to suggest chronic infarction. No foci of susceptibility artifact to suggest acute or chronic intracranial hemorrhage. No mass lesion, midline shift or mass effect. No hydrocephalus. No extra-axial fluid collection. Major dural sinuses are grossly patent. Pituitary gland and suprasellar region are normal. Midline structures intact and normal. No abnormal enhancement. Vascular: Major intracranial vascular flow voids well maintained and normal in appearance. Skull and upper cervical spine: Craniocervical junction normal. Visualized upper cervical spine within normal limits. Bone marrow signal intensity normal. No scalp soft tissue abnormality. Sinuses/Orbits: Patient status post ocular lens replacement on the left. Globes and orbital soft tissues otherwise within normal limits. Paranasal sinuses are clear. Small right mastoid effusion noted, of doubtful significance. Left mastoid air cells are clear. Inner ear structures normal. Other: None. MRI CERVICAL SPINE FINDINGS Alignment: Straightening of the normal cervical lordosis. Trace retrolisthesis of C5 on C6, chronic and degenerative. Vertebrae: Vertebral body height maintained without evidence for acute or chronic fracture. Bone marrow signal intensity within normal limits. Discogenic reactive endplate changes present about the C5-6 interspace. No other abnormal marrow edema or enhancement. No evidence for osteomyelitis discitis or septic arthritis. Cord: Signal intensity within the cervical spinal cord is normal. No evidence for demyelinating disease. Normal cord caliber morphology. No abnormal enhancement. Posterior Fossa, vertebral arteries, paraspinal tissues: Visualized brain and posterior fossa within normal limits. Craniocervical junction normal. Paraspinous and prevertebral soft tissues within normal limits. Normal intravascular  flow voids seen within the vertebral arteries bilaterally. Disc levels: C2-C3: Unremarkable. C3-C4: Negative interspace. Mild right greater than left facet hypertrophy. No canal or foraminal stenosis. C4-C5: Mild disc bulge with uncovertebral hypertrophy. Right greater than left facet degeneration. Superimposed tiny central disc protrusion minimally indents the ventral thecal sac. Stable mild spinal stenosis without significant cord deformity. Mild right C5 foraminal stenosis. Left neural foramen remains patent. Appearance is stable. C5-C6: Retrolisthesis. Chronic intervertebral disc space narrowing with diffuse disc bulge and bilateral uncovertebral spurring. Superimposed mild facet and ligament flavum hypertrophy. Resultant mild spinal stenosis without cord deformity. Moderate left worse than right C6 foraminal narrowing, stable. C6-C7: Mild disc bulge with uncovertebral spurring. Bulging disc mildly flattens and effaces the ventral thecal sac, greater on the right. No significant spinal stenosis or cord deformity. Mild right C7 foraminal stenosis. No significant left foraminal narrowing. Appearance is stable. C7-T1:  Normal interspace.  Mild facet hypertrophy.  No stenosis. MRI THORACIC SPINE FINDINGS Alignment: Trace dextroscoliosis. Alignment otherwise normal with preservation of the normal thoracic kyphosis. No listhesis. Vertebrae: Vertebral body height maintained without evidence for acute or chronic fracture. Bone marrow signal intensity within normal limits. No discrete or worrisome osseous lesions. No abnormal marrow edema or enhancement. Cord: Signal intensity within the thoracic spinal cord is normal. Normal cord caliber morphology. No signal changes to suggest demyelinating disease. No abnormal enhancement. Conus medullaris terminates at the L1 level. Paraspinal tissues: Paraspinous soft tissues within normal limits. Partially visualized lungs are grossly clear. Visualized visceral structures  unremarkable. Disc levels: No significant disc pathology seen within the thoracic spine. No significant disc bulge or focal disc herniation. No canal or foraminal stenosis. IMPRESSION: MRI HEAD IMPRESSION: Stable normal MRI of the brain, with no acute intracranial abnormality identified. No evidence for acute ischemia or demyelinating disease. MRI CERVICAL SPINE IMPRESSION: 1. Stable normal MRI appearance of the cervical spinal cord with no cord signal abnormality to suggest myelomalacia or demyelinating disease. 2. Degenerative spondylosis  at C5-6 with resultant mild canal and moderate bilateral C6 foraminal stenosis. 3. Mild right C5 and C7 foraminal narrowing related to disc bulge and uncovertebral disease, stable. MRI THORACIC SPINE IMPRESSION: Normal MRI of the thoracic spine and spinal cord. No evidence for myelomalacia or demyelinating disease. No significant disc pathology or stenosis. Electronically Signed   By: Jeannine Boga M.D.   On: 04/27/2020 21:05   MR CERVICAL SPINE W WO CONTRAST  Result Date: 04/27/2020 CLINICAL DATA:  Initial evaluation for right-sided weakness, syncopal episode. Evaluate for possible demyelinating disease. EXAM: MRI HEAD WITHOUT AND WITH CONTRAST MRI CERVICAL SPINE WITHOUT AND WITH CONTRAST MRI THORACIC SPINE WITHOUT AND WITH CONTRAST TECHNIQUE: Multiplanar, multiecho pulse sequences of the brain and surrounding structures, and cervical and thoracic spine, to include the craniocervical junction and cervicothoracic junction, were obtained without and with intravenous contrast. CONTRAST:  54mL GADAVIST GADOBUTROL 1 MMOL/ML IV SOLN COMPARISON:  Prior CT from earlier same day as well as previous brain MRI from 10/02/2017 and cervical spine MRI from 07/16/2019. FINDINGS: MRI HEAD FINDINGS Brain: Cerebral volume within normal limits for patient age. No focal parenchymal signal abnormality identified. No abnormal foci of restricted diffusion to suggest acute or subacute  ischemia. Gray-white matter differentiation well maintained. No encephalomalacia to suggest chronic infarction. No foci of susceptibility artifact to suggest acute or chronic intracranial hemorrhage. No mass lesion, midline shift or mass effect. No hydrocephalus. No extra-axial fluid collection. Major dural sinuses are grossly patent. Pituitary gland and suprasellar region are normal. Midline structures intact and normal. No abnormal enhancement. Vascular: Major intracranial vascular flow voids well maintained and normal in appearance. Skull and upper cervical spine: Craniocervical junction normal. Visualized upper cervical spine within normal limits. Bone marrow signal intensity normal. No scalp soft tissue abnormality. Sinuses/Orbits: Patient status post ocular lens replacement on the left. Globes and orbital soft tissues otherwise within normal limits. Paranasal sinuses are clear. Small right mastoid effusion noted, of doubtful significance. Left mastoid air cells are clear. Inner ear structures normal. Other: None. MRI CERVICAL SPINE FINDINGS Alignment: Straightening of the normal cervical lordosis. Trace retrolisthesis of C5 on C6, chronic and degenerative. Vertebrae: Vertebral body height maintained without evidence for acute or chronic fracture. Bone marrow signal intensity within normal limits. Discogenic reactive endplate changes present about the C5-6 interspace. No other abnormal marrow edema or enhancement. No evidence for osteomyelitis discitis or septic arthritis. Cord: Signal intensity within the cervical spinal cord is normal. No evidence for demyelinating disease. Normal cord caliber morphology. No abnormal enhancement. Posterior Fossa, vertebral arteries, paraspinal tissues: Visualized brain and posterior fossa within normal limits. Craniocervical junction normal. Paraspinous and prevertebral soft tissues within normal limits. Normal intravascular flow voids seen within the vertebral arteries  bilaterally. Disc levels: C2-C3: Unremarkable. C3-C4: Negative interspace. Mild right greater than left facet hypertrophy. No canal or foraminal stenosis. C4-C5: Mild disc bulge with uncovertebral hypertrophy. Right greater than left facet degeneration. Superimposed tiny central disc protrusion minimally indents the ventral thecal sac. Stable mild spinal stenosis without significant cord deformity. Mild right C5 foraminal stenosis. Left neural foramen remains patent. Appearance is stable. C5-C6: Retrolisthesis. Chronic intervertebral disc space narrowing with diffuse disc bulge and bilateral uncovertebral spurring. Superimposed mild facet and ligament flavum hypertrophy. Resultant mild spinal stenosis without cord deformity. Moderate left worse than right C6 foraminal narrowing, stable. C6-C7: Mild disc bulge with uncovertebral spurring. Bulging disc mildly flattens and effaces the ventral thecal sac, greater on the right. No significant spinal stenosis or cord deformity. Mild  right C7 foraminal stenosis. No significant left foraminal narrowing. Appearance is stable. C7-T1:  Normal interspace.  Mild facet hypertrophy.  No stenosis. MRI THORACIC SPINE FINDINGS Alignment: Trace dextroscoliosis. Alignment otherwise normal with preservation of the normal thoracic kyphosis. No listhesis. Vertebrae: Vertebral body height maintained without evidence for acute or chronic fracture. Bone marrow signal intensity within normal limits. No discrete or worrisome osseous lesions. No abnormal marrow edema or enhancement. Cord: Signal intensity within the thoracic spinal cord is normal. Normal cord caliber morphology. No signal changes to suggest demyelinating disease. No abnormal enhancement. Conus medullaris terminates at the L1 level. Paraspinal tissues: Paraspinous soft tissues within normal limits. Partially visualized lungs are grossly clear. Visualized visceral structures unremarkable. Disc levels: No significant disc  pathology seen within the thoracic spine. No significant disc bulge or focal disc herniation. No canal or foraminal stenosis. IMPRESSION: MRI HEAD IMPRESSION: Stable normal MRI of the brain, with no acute intracranial abnormality identified. No evidence for acute ischemia or demyelinating disease. MRI CERVICAL SPINE IMPRESSION: 1. Stable normal MRI appearance of the cervical spinal cord with no cord signal abnormality to suggest myelomalacia or demyelinating disease. 2. Degenerative spondylosis at C5-6 with resultant mild canal and moderate bilateral C6 foraminal stenosis. 3. Mild right C5 and C7 foraminal narrowing related to disc bulge and uncovertebral disease, stable. MRI THORACIC SPINE IMPRESSION: Normal MRI of the thoracic spine and spinal cord. No evidence for myelomalacia or demyelinating disease. No significant disc pathology or stenosis. Electronically Signed   By: Jeannine Boga M.D.   On: 04/27/2020 21:05   MR THORACIC SPINE W WO CONTRAST  Result Date: 04/27/2020 CLINICAL DATA:  Initial evaluation for right-sided weakness, syncopal episode. Evaluate for possible demyelinating disease. EXAM: MRI HEAD WITHOUT AND WITH CONTRAST MRI CERVICAL SPINE WITHOUT AND WITH CONTRAST MRI THORACIC SPINE WITHOUT AND WITH CONTRAST TECHNIQUE: Multiplanar, multiecho pulse sequences of the brain and surrounding structures, and cervical and thoracic spine, to include the craniocervical junction and cervicothoracic junction, were obtained without and with intravenous contrast. CONTRAST:  40mL GADAVIST GADOBUTROL 1 MMOL/ML IV SOLN COMPARISON:  Prior CT from earlier same day as well as previous brain MRI from 10/02/2017 and cervical spine MRI from 07/16/2019. FINDINGS: MRI HEAD FINDINGS Brain: Cerebral volume within normal limits for patient age. No focal parenchymal signal abnormality identified. No abnormal foci of restricted diffusion to suggest acute or subacute ischemia. Gray-white matter differentiation well  maintained. No encephalomalacia to suggest chronic infarction. No foci of susceptibility artifact to suggest acute or chronic intracranial hemorrhage. No mass lesion, midline shift or mass effect. No hydrocephalus. No extra-axial fluid collection. Major dural sinuses are grossly patent. Pituitary gland and suprasellar region are normal. Midline structures intact and normal. No abnormal enhancement. Vascular: Major intracranial vascular flow voids well maintained and normal in appearance. Skull and upper cervical spine: Craniocervical junction normal. Visualized upper cervical spine within normal limits. Bone marrow signal intensity normal. No scalp soft tissue abnormality. Sinuses/Orbits: Patient status post ocular lens replacement on the left. Globes and orbital soft tissues otherwise within normal limits. Paranasal sinuses are clear. Small right mastoid effusion noted, of doubtful significance. Left mastoid air cells are clear. Inner ear structures normal. Other: None. MRI CERVICAL SPINE FINDINGS Alignment: Straightening of the normal cervical lordosis. Trace retrolisthesis of C5 on C6, chronic and degenerative. Vertebrae: Vertebral body height maintained without evidence for acute or chronic fracture. Bone marrow signal intensity within normal limits. Discogenic reactive endplate changes present about the C5-6 interspace. No other abnormal marrow edema  or enhancement. No evidence for osteomyelitis discitis or septic arthritis. Cord: Signal intensity within the cervical spinal cord is normal. No evidence for demyelinating disease. Normal cord caliber morphology. No abnormal enhancement. Posterior Fossa, vertebral arteries, paraspinal tissues: Visualized brain and posterior fossa within normal limits. Craniocervical junction normal. Paraspinous and prevertebral soft tissues within normal limits. Normal intravascular flow voids seen within the vertebral arteries bilaterally. Disc levels: C2-C3: Unremarkable. C3-C4:  Negative interspace. Mild right greater than left facet hypertrophy. No canal or foraminal stenosis. C4-C5: Mild disc bulge with uncovertebral hypertrophy. Right greater than left facet degeneration. Superimposed tiny central disc protrusion minimally indents the ventral thecal sac. Stable mild spinal stenosis without significant cord deformity. Mild right C5 foraminal stenosis. Left neural foramen remains patent. Appearance is stable. C5-C6: Retrolisthesis. Chronic intervertebral disc space narrowing with diffuse disc bulge and bilateral uncovertebral spurring. Superimposed mild facet and ligament flavum hypertrophy. Resultant mild spinal stenosis without cord deformity. Moderate left worse than right C6 foraminal narrowing, stable. C6-C7: Mild disc bulge with uncovertebral spurring. Bulging disc mildly flattens and effaces the ventral thecal sac, greater on the right. No significant spinal stenosis or cord deformity. Mild right C7 foraminal stenosis. No significant left foraminal narrowing. Appearance is stable. C7-T1:  Normal interspace.  Mild facet hypertrophy.  No stenosis. MRI THORACIC SPINE FINDINGS Alignment: Trace dextroscoliosis. Alignment otherwise normal with preservation of the normal thoracic kyphosis. No listhesis. Vertebrae: Vertebral body height maintained without evidence for acute or chronic fracture. Bone marrow signal intensity within normal limits. No discrete or worrisome osseous lesions. No abnormal marrow edema or enhancement. Cord: Signal intensity within the thoracic spinal cord is normal. Normal cord caliber morphology. No signal changes to suggest demyelinating disease. No abnormal enhancement. Conus medullaris terminates at the L1 level. Paraspinal tissues: Paraspinous soft tissues within normal limits. Partially visualized lungs are grossly clear. Visualized visceral structures unremarkable. Disc levels: No significant disc pathology seen within the thoracic spine. No significant disc  bulge or focal disc herniation. No canal or foraminal stenosis. IMPRESSION: MRI HEAD IMPRESSION: Stable normal MRI of the brain, with no acute intracranial abnormality identified. No evidence for acute ischemia or demyelinating disease. MRI CERVICAL SPINE IMPRESSION: 1. Stable normal MRI appearance of the cervical spinal cord with no cord signal abnormality to suggest myelomalacia or demyelinating disease. 2. Degenerative spondylosis at C5-6 with resultant mild canal and moderate bilateral C6 foraminal stenosis. 3. Mild right C5 and C7 foraminal narrowing related to disc bulge and uncovertebral disease, stable. MRI THORACIC SPINE IMPRESSION: Normal MRI of the thoracic spine and spinal cord. No evidence for myelomalacia or demyelinating disease. No significant disc pathology or stenosis. Electronically Signed   By: Jeannine Boga M.D.   On: 04/27/2020 21:05   CT HEAD CODE STROKE WO CONTRAST  Result Date: 04/27/2020 CLINICAL DATA:  Code stroke. Neuro deficit, acute, stroke suspected. Additional history provided: 11 a.m. last known normal, right-sided weakness/numbness, syncopal episode. EXAM: CT HEAD WITHOUT CONTRAST TECHNIQUE: Contiguous axial images were obtained from the base of the skull through the vertex without intravenous contrast. COMPARISON:  CTA head/neck 07/16/2019 FINDINGS: Brain: There is no acute intracranial hemorrhage. No demarcated cortical infarct. No extra-axial fluid collection. No evidence of intracranial mass. No midline shift. Vascular: No hyperdense vessel. Skull: Normal. Negative for fracture or focal lesion. Sinuses/Orbits: Visualized orbits show no acute finding. No significant paranasal sinus disease or mastoid effusion at the imaged levels. ASPECTS [facility] Stroke Program Early CT Score) - Ganglionic level infarction (caudate, lentiform nuclei, internal capsule, insula,  M1-M3 cortex): 7 - Supraganglionic infarction (M4-M6 cortex): 3 Total score (0-10 with 10 being normal): 10  These results were communicated to Dr. Cheral Marker At 4:52 pmon 5/5/2021by text page via the [facility] messaging system. IMPRESSION: No CT evidence of acute intracranial abnormality.  ASPECTS is 10. Electronically Signed   By: Kellie Simmering DO   On: 04/27/2020 16:53    Assessment & Plan:   Ashley [name] was seen today for palpitations, hypertension and diabetes.  Diagnoses and all orders for this visit:  Essential hypertension- Her blood pressure is adequately well controlled.  Electrolytes and renal function are normal. 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 -     Basic metabolic panel; Future -     Microalbumin / creatinine urine ratio; Future -     Microalbumin / creatinine urine ratio -     Basic metabolic panel -     HM Diabetes Foot Exam  Thiamine deficiency- Her thiamine level is normal now. -     CBC with Differential/Platelet; Future -     Vitamin B1; Future -     Vitamin B1 -     CBC with Differential/Platelet  Dietary iron deficiency- She is not anemic but her iron level is low.  I have asked her to continue taking the iron supplement twice a day. -     CBC with Differential/Platelet; Future -     Ferritin; Future -     IBC panel; Future -     IBC panel -     Ferritin -     CBC with Differential/Platelet  Heart palpitations-I have asked him to undergo an event monitor to see if there is a correlation between the palpitations and syncope. -     EKG 12-Lead -     Cancel: CARDIAC EVENT MONITOR; Future -     CARDIAC EVENT MONITOR; Future  Hypokalemia-  Will treat this with a potassium supplement. -     Magnesium; Future -     potassium chloride SA (KLOR-CON) 20 MEQ tablet; Take 1 tablet (20 mEq total) by mouth 2 (two) times daily. -     Magnesium  Syncope and  collapse- I think this is multifactorial.  There is some degree of polypharmacy.  She also has neurological disease and history of vitamin deficiencies.  My concern is that she complains of palpitations so I have asked her to undergo an event monitor to see if there is a dysrhythmia that explains the syncope.   I have discontinued Odaliz B. Wildes's oxyCODONE. I am also having her start on potassium chloride SA. Additionally, I am having her maintain her Adjustable Lancing Device, AquaLance Lancets 30G, Bayer Contour Test, Insulin Pen Needle, aspirin EC, cholecalciferol, nitroGLYCERIN, [name], dicyclomine, rosuvastatin, albuterol, venlafaxine XR, Nurtec, traZODone, pantoprazole, etodolac, FeroSul, fludrocortisone, Levemir, estradiol, promethazine, Trulicity, and clonazePAM.  Meds ordered this encounter  Medications   potassium chloride SA (KLOR-CON) 20 MEQ tablet    Sig: Take 1 tablet (20 mEq total) by mouth 2 (two) times daily.    Dispense:  180 tablet    Refill:  0   I spent 60 minutes in preparing to see the patient by review of recent labs, imaging and procedures, obtaining and reviewing separately obtained  history, communicating with the patient and family or caregiver, ordering medications, tests or procedures, and documenting clinical information in the EHR including the differential Dx, treatment, and any further evaluation and other management of 1. Essential hypertension 2. Diabetes mellitus with complication ([place]) 3. Thiamine deficiency 4. Dietary iron deficiency 5. Heart palpitations 6. Hypokalemia 7. Syncope and collapse      Follow-up: Return in about 2 months (around 07/09/2020).  Scarlette Calico, MD

## 2020-05-10 LAB — URINALYSIS, ROUTINE W REFLEX MICROSCOPIC
Bilirubin Urine: NEGATIVE
Ketones, ur: NEGATIVE
Leukocytes,Ua: NEGATIVE
Nitrite: NEGATIVE
Specific Gravity, Urine: 1.015 (ref 1.000–1.030)
Total Protein, Urine: NEGATIVE
Urine Glucose: NEGATIVE
Urobilinogen, UA: 0.2 (ref 0.0–1.0)
pH: 6 (ref 5.0–8.0)

## 2020-05-10 LAB — FERRITIN: Ferritin: 120.2 ng/mL (ref 10.0–291.0)

## 2020-05-11 ENCOUNTER — Telehealth: Payer: Self-pay | Admitting: Radiology

## 2020-05-11 NOTE — Telephone Encounter (Signed)
Enrolled patient for a 30 day [employer] Event monitor to be mailed to patients home. Brief instructions were gone over with the patient and she knows to expect the monitor to arrive in 5-7 days,

## 2020-05-12 ENCOUNTER — Telehealth: Payer: Self-pay | Admitting: *Deleted

## 2020-05-12 MED ORDER — BOTOX 200 UNITS IJ SOLR: INTRAMUSCULAR | 0 refills | Status: DC

## 2020-05-12 NOTE — Telephone Encounter (Signed)
Noted, thank you  Will call [facility] to schedule delivery before appointment.

## 2020-05-12 NOTE — Telephone Encounter (Signed)
Received Botox Rx refill request from [place]. Refill x 1 sent.

## 2020-05-13 ENCOUNTER — Encounter: Payer: Self-pay | Admitting: Internal Medicine

## 2020-05-13 LAB — VITAMIN B1: Vitamin B1 (Thiamine): 13 nmol/L (ref 8–30)

## 2020-05-16 ENCOUNTER — Encounter (INDEPENDENT_AMBULATORY_CARE_PROVIDER_SITE_OTHER): Payer: 59

## 2020-05-16 DIAGNOSIS — R002 Palpitations: Secondary | ICD-10-CM | POA: Diagnosis not present

## 2020-05-24 ENCOUNTER — Ambulatory Visit: Payer: 59 | Admitting: Family Medicine

## 2020-05-24 ENCOUNTER — Encounter: Payer: Self-pay | Admitting: Family Medicine

## 2020-05-25 ENCOUNTER — Other Ambulatory Visit: Payer: Self-pay | Admitting: Internal Medicine

## 2020-05-25 DIAGNOSIS — E118 Type 2 diabetes mellitus with unspecified complications: Secondary | ICD-10-CM

## 2020-05-25 DIAGNOSIS — E1165 Type 2 diabetes mellitus with hyperglycemia: Secondary | ICD-10-CM

## 2020-05-30 ENCOUNTER — Ambulatory Visit: Payer: 59 | Admitting: Internal Medicine

## 2020-06-01 ENCOUNTER — Telehealth: Payer: Self-pay | Admitting: Family Medicine

## 2020-06-01 NOTE — Telephone Encounter (Signed)
I called [facility] back and scheduled delivery for 06/07/20 for 07/14/20 appointment.

## 2020-06-01 NOTE — Telephone Encounter (Signed)
[facility] called and LVM needing to schedule a Botox delivery appt. Please call [phone] Mon.-Fri. 8am-10pm EST

## 2020-06-02 NOTE — Telephone Encounter (Signed)
PA with [place] valid 06/01/20 to 08/30/20. Case #[id number].

## 2020-06-07 ENCOUNTER — Ambulatory Visit: Payer: 59 | Admitting: Internal Medicine

## 2020-06-07 DIAGNOSIS — Z0289 Encounter for other administrative examinations: Secondary | ICD-10-CM

## 2020-06-07 NOTE — Telephone Encounter (Signed)
Botox delivered 06/07/20 for patient's 07/14/20 appointment. X1 200U vial.

## 2020-06-13 ENCOUNTER — Other Ambulatory Visit: Payer: Self-pay | Admitting: Internal Medicine

## 2020-06-13 DIAGNOSIS — K581 Irritable bowel syndrome with constipation: Secondary | ICD-10-CM

## 2020-06-13 DIAGNOSIS — F411 Generalized anxiety disorder: Secondary | ICD-10-CM

## 2020-06-13 NOTE — Telephone Encounter (Signed)
Done erx 

## 2020-06-27 ENCOUNTER — Other Ambulatory Visit: Payer: Self-pay | Admitting: Internal Medicine

## 2020-07-14 ENCOUNTER — Ambulatory Visit (INDEPENDENT_AMBULATORY_CARE_PROVIDER_SITE_OTHER): Payer: 59 | Admitting: Family Medicine

## 2020-07-14 ENCOUNTER — Other Ambulatory Visit: Payer: Self-pay

## 2020-07-14 DIAGNOSIS — G43119 Migraine with aura, intractable, without status migrainosus: Secondary | ICD-10-CM

## 2020-07-14 NOTE — Progress Notes (Signed)
Botox-200unitsx1 vials Lot: [id number] Expiration: 03/24 NDC: 3149-7026-37   0.9% Sodium Chloride- 109mL total Lot: [id number] Expiration: 09/2022 NDC: 77412-878-67  Dx: Chronic Migraine SP  Consent signed

## 2020-07-14 NOTE — Progress Notes (Signed)
She returns for second Botox treatment. She is now having about 16 migrainous days per month.  She does continue [name] monthly and Effexor 150 mg daily for prevention.  She uses Nurtec and Phenergan for abortive therapy.  She is also on chronic opioids for chronic pain. She admits that stress is a trigger. She lost her mother in February and has been helping to care for her father. She is going on vacation with family next week and is excited to relax.    Consent Form Botulism Toxin Injection For Chronic Migraine    Reviewed orally with patient, additionally signature is on file:  Botulism toxin has been approved by the Federal drug administration for treatment of chronic migraine. Botulism toxin does not cure chronic migraine and it may not be effective in some patients.  The administration of botulism toxin is accomplished by injecting a small amount of toxin into the muscles of the neck and head. Dosage must be titrated for each individual. Any benefits resulting from botulism toxin tend to wear off after 3 months with a repeat injection required if benefit is to be maintained. Injections are usually done every 3-4 months with maximum effect peak achieved by about 2 or 3 weeks. Botulism toxin is expensive and you should be sure of what costs you will incur resulting from the injection.  The side effects of botulism toxin use for chronic migraine may include:   -Transient, and usually mild, facial weakness with facial injections  -Transient, and usually mild, head or neck weakness with head/neck injections  -Reduction or loss of forehead facial animation due to forehead muscle weakness  -Eyelid drooping  -Dry eye  -Pain at the site of injection or bruising at the site of injection  -Double vision  -Potential unknown long term risks   Contraindications: You should not have Botox if you are pregnant, nursing, allergic to albumin, have an infection, skin condition, or muscle weakness  at the site of the injection, or have myasthenia gravis, Lambert-Eaton syndrome, or ALS.  It is also possible that as with any injection, there may be an allergic reaction or no effect from the medication. Reduced effectiveness after repeated injections is sometimes seen and rarely infection at the injection site may occur. All care will be taken to prevent these side effects. If therapy is given over a long time, atrophy and wasting in the muscle injected may occur. Occasionally the patient's become refractory to treatment because they develop antibodies to the toxin. In this event, therapy needs to be modified.  I have read the above information and consent to the administration of botulism toxin.    BOTOX PROCEDURE NOTE FOR MIGRAINE HEADACHE  Contraindications and precautions discussed with patient(above). Aseptic procedure was observed and patient tolerated procedure. Procedure performed by Debbora Presto, FNP-C.   The condition has existed for more than 6 months, and pt does not have a diagnosis of ALS, Myasthenia Gravis or Lambert-Eaton Syndrome.  Risks and benefits of injections discussed and pt agrees to proceed with the procedure.  Written consent obtained  These injections are medically necessary. Pt  receives good benefits from these injections. These injections do not cause sedations or hallucinations which the oral therapies may cause.   Description of procedure:  The patient was placed in a sitting position. The standard protocol was used for Botox as follows, with 5 units of Botox injected at each site:  -Procerus muscle, midline injection  -Corrugator muscle, bilateral injection  -Frontalis muscle, bilateral  injection, with 2 sites each side, medial injection was performed in the upper one third of the frontalis muscle, in the region vertical from the medial inferior edge of the superior orbital rim. The lateral injection was again in the upper one third of the forehead vertically  above the lateral limbus of the cornea, 1.5 cm lateral to the medial injection site.  -Temporalis muscle injection, 4 sites, bilaterally. The first injection was 3 cm above the tragus of the ear, second injection site was 1.5 cm to 3 cm up from the first injection site in line with the tragus of the ear. The third injection site was 1.5-3 cm forward between the first 2 injection sites. The fourth injection site was 1.5 cm posterior to the second injection site. 5th site laterally in the temporalis  muscleat the level of the outer canthus.  -Occipitalis muscle injection, 3 sites, bilaterally. The first injection was done one half way between the occipital protuberance and the tip of the mastoid process behind the ear. The second injection site was done lateral and superior to the first, 1 fingerbreadth from the first injection. The third injection site was 1 fingerbreadth superiorly and medially from the first injection site.  -Cervical paraspinal muscle injection, 2 sites, bilaterally. The first injection site was 1 cm from the midline of the cervical spine, 3 cm inferior to the lower border of the occipital protuberance. The second injection site was 1.5 cm superiorly and laterally to the first injection site.  -Trapezius muscle injection was performed at 3 sites, bilaterally. The first injection site was in the upper trapezius muscle halfway between the inflection point of the neck, and the acromion. The second injection site was one half way between the acromion and the first injection site. The third injection was done between the first injection site and the inflection point of the neck.   Will return for repeat injection in 3 months.   A total of 200 units of Botox was prepared, 155 units of Botox was injected as documented above, any Botox not injected was wasted. The patient tolerated the procedure well, there were no complications of the above procedure.

## 2020-07-16 ENCOUNTER — Other Ambulatory Visit: Payer: Self-pay | Admitting: Internal Medicine

## 2020-07-16 DIAGNOSIS — F411 Generalized anxiety disorder: Secondary | ICD-10-CM

## 2020-07-16 DIAGNOSIS — I739 Peripheral vascular disease, unspecified: Secondary | ICD-10-CM

## 2020-07-16 DIAGNOSIS — E118 Type 2 diabetes mellitus with unspecified complications: Secondary | ICD-10-CM

## 2020-07-16 DIAGNOSIS — E785 Hyperlipidemia, unspecified: Secondary | ICD-10-CM

## 2020-07-16 DIAGNOSIS — I959 Hypotension, unspecified: Secondary | ICD-10-CM

## 2020-07-24 ENCOUNTER — Other Ambulatory Visit: Payer: Self-pay | Admitting: Internal Medicine

## 2020-07-24 DIAGNOSIS — K2100 Gastro-esophageal reflux disease with esophagitis, without bleeding: Secondary | ICD-10-CM

## 2020-07-25 ENCOUNTER — Other Ambulatory Visit: Payer: Self-pay | Admitting: Internal Medicine

## 2020-07-25 ENCOUNTER — Ambulatory Visit: Payer: 59 | Admitting: Internal Medicine

## 2020-07-25 ENCOUNTER — Other Ambulatory Visit: Payer: Self-pay

## 2020-07-25 ENCOUNTER — Encounter: Payer: Self-pay | Admitting: Internal Medicine

## 2020-07-25 VITALS — BP 160/80 | HR 89 | Temp 98.1°F | Resp 16 | Ht 62.0 in | Wt 149.0 lb

## 2020-07-25 DIAGNOSIS — I471 Supraventricular tachycardia, unspecified: Secondary | ICD-10-CM | POA: Insufficient documentation

## 2020-07-25 DIAGNOSIS — E118 Type 2 diabetes mellitus with unspecified complications: Secondary | ICD-10-CM | POA: Diagnosis not present

## 2020-07-25 DIAGNOSIS — I10 Essential (primary) hypertension: Secondary | ICD-10-CM | POA: Diagnosis not present

## 2020-07-25 DIAGNOSIS — E519 Thiamine deficiency, unspecified: Secondary | ICD-10-CM

## 2020-07-25 DIAGNOSIS — E876 Hypokalemia: Secondary | ICD-10-CM | POA: Diagnosis not present

## 2020-07-25 DIAGNOSIS — N951 Menopausal and female climacteric states: Secondary | ICD-10-CM | POA: Insufficient documentation

## 2020-07-25 MED ORDER — PREMARIN 0.625 MG/GM VA CREA: 1.0000 | TOPICAL_CREAM | Freq: Every day | VAGINAL | 0 refills | Status: DC

## 2020-07-25 MED ORDER — IRBESARTAN 150 MG PO TABS: 150.0000 mg | ORAL_TABLET | Freq: Every day | ORAL | 1 refills | Status: DC

## 2020-07-25 NOTE — Patient Instructions (Signed)

## 2020-07-25 NOTE — Progress Notes (Signed)
Subjective:  Patient ID: Ashley [name], female    DOB: Jul 08, 1966  Age: 54 y.o. MRN: [id number]  CC: Hypertension and Diabetes  This visit occurred during the SARS-CoV-2 public health emergency.  Safety protocols were in place, including screening questions prior to the visit, additional usage of staff PPE, and extensive cleaning of exam room while observing appropriate contact time as indicated for disinfecting solutions.    HPI Ashley [name] presents for f/up - She continues to have syncopal episodes.  The last one occurred within the last 2 weeks.  She feels lightheaded and then she goes down.  She is down for couple of minutes and then quickly recovers.  She has undergone a cardiac event monitor which revealed episodes of SVT but those were unrelated to her symptoms.  She has seen neurology and no neurological calls for the syncope has been identified.  She thinks her blood pressure and blood sugars have been well controlled.  Outpatient Medications Prior to Visit  Medication Sig Dispense Refill  . albuterol (VENTOLIN HFA) 108 (90 Base) MCG/ACT inhaler INHALE 2 PUFFS INTO THE LUNGS EVERY 6 HOURS AS NEEDED FOR WHEEZING OR SHORTNESS OF BREATH (Patient taking differently: Inhale 2 puffs into the lungs every 6 (six) hours as needed for wheezing or shortness of breath. ) 6.7 g 3  . AQUALANCE LANCETS 30G MISC   98  . aspirin EC 81 MG tablet Take 1 tablet (81 mg total) by mouth daily. (Patient taking differently: Take 81 mg by mouth at bedtime. ) 90 tablet 1  . BAYER CONTOUR TEST test strip Testing for up to tid 100 each 98  . Botulinum Toxin Type A (BOTOX) 200 units SOLR INJECT 155 UNITS IN THE MUSCLE INTO HEAD AND NECK MUSCLES EVERY 3 MONTHS BY PROVIDER. DISCARD REMAINDER. 1 each 0  . cholecalciferol (VITAMIN D3) 25 MCG (1000 UT) tablet Take 2,000 Units by mouth daily.    . clonazePAM (KLONOPIN) 1 MG tablet TAKE 1 TABLET(1 MG) BY MOUTH TWICE DAILY 60 tablet 2  . dicyclomine (BENTYL) 10 MG capsule  Take 1 capsule (10 mg total) by mouth 2 (two) times daily as needed (Cramping and abdominal pain). (Patient taking differently: Take 10 mg by mouth in the morning and at bedtime. ) 180 capsule 1  . FEROSUL 325 (65 Fe) MG tablet TAKE 1 TABLET(325 MG) BY MOUTH TWICE DAILY WITH A MEAL (Patient taking differently: Take 325 mg by mouth 2 (two) times daily with a meal. ) 180 tablet 1  . fludrocortisone (FLORINEF) 0.1 MG tablet TAKE 1 TABLET(0.1 MG) BY MOUTH DAILY 90 tablet 0  . Fremanezumab-vfrm ([name]) 225 MG/1.5ML SOSY Inject 225 mg into the skin every 30 (thirty) days. 1.5 mL 11  . insulin detemir (LEVEMIR) 100 UNIT/ML injection Inject 0.3 mLs (30 Units total) into the skin daily. 10 mL 1  . Insulin Pen Needle 32G X 6 MM MISC Use daily as directed 100 each 1  . Lancet Devices (ADJUSTABLE LANCING DEVICE) MISC Up to tid testing  98  . nitroGLYCERIN (NITROSTAT) 0.4 MG SL tablet Place 1 tablet (0.4 mg total) under the tongue every 5 (five) minutes x 3 doses as needed for chest pain. 30 tablet 5  . promethazine (PHENERGAN) 25 MG tablet TAKE 1 TABLET(25 MG) BY MOUTH EVERY 6 HOURS AS NEEDED FOR NAUSEA OR VOMITING (Patient taking differently: Take 25 mg by mouth every 6 (six) hours as needed for nausea or vomiting. ) 30 tablet 1  . Rimegepant Sulfate (  NURTEC) 75 MG TBDP Take 75 mg by mouth daily as needed. For migraines. Take as close to onset of migraine as possible. One daily maximum. (Patient taking differently: Take 75 mg by mouth daily as needed (For migraines). ) 10 tablet 6  . rosuvastatin (CRESTOR) 20 MG tablet TAKE 1 TABLET BY MOUTH EVERY DAY, NEED OFFICE VISIT FOR MORE REFILLS 90 tablet 1  . traZODone (DESYREL) 150 MG tablet TAKE 1 TABLET(150 MG) BY MOUTH AT BEDTIME 90 tablet 1  . venlafaxine XR (EFFEXOR-XR) 150 MG 24 hr capsule Take 1 capsule (150 mg total) by mouth daily with breakfast. (Patient taking differently: Take 150 mg by mouth daily. ) 90 capsule 3  . Dulaglutide (TRULICITY) 1.49 FW/2.6VZ SOPN  Inject 1 pen into the skin once a week. 4 mL 0  . estradiol (CLIMARA - DOSED IN MG/24 HR) 0.0375 mg/24hr patch APPLY 1 PATCH(0.038 MG) EXTERNALLY TO THE SKIN 1 TIME A WEEK (Patient taking differently: Place 0.0375 mg onto the skin once a week. ) 12 patch 0  . etodolac (LODINE) 400 MG tablet TAKE 1 TABLET(400 MG) BY MOUTH TWICE DAILY AS NEEDED FOR BACK PAIN (Patient taking differently: Take 400 mg by mouth 2 (two) times daily as needed for mild pain (Back Pain). ) 60 tablet 5  . pantoprazole (PROTONIX) 40 MG tablet TAKE 1 TABLET(40 MG) BY MOUTH DAILY (Patient taking differently: Take 40 mg by mouth daily. ) 90 tablet 1  . potassium chloride SA (KLOR-CON) 20 MEQ tablet Take 1 tablet (20 mEq total) by mouth 2 (two) times daily. 180 tablet 0   No facility-administered medications prior to visit.    ROS Review of Systems  Constitutional: Negative for appetite change, chills, diaphoresis, fatigue and fever.  HENT: Negative.   Respiratory: Negative for cough, chest tightness, shortness of breath and wheezing.   Cardiovascular: Negative for chest pain, palpitations and leg swelling.  Gastrointestinal: Negative for abdominal pain, constipation, diarrhea, nausea and vomiting.  Endocrine: Negative for polydipsia, polyphagia and polyuria.  Genitourinary: Positive for dyspareunia. Negative for decreased urine volume, difficulty urinating, dysuria, hematuria, menstrual problem, pelvic pain, urgency, vaginal bleeding, vaginal discharge and vaginal pain.       ++ vaginal dryness  Musculoskeletal: Negative.  Negative for arthralgias, myalgias and neck stiffness.  Skin: Negative.  Negative for color change and pallor.  Neurological: Positive for syncope, light-headedness and headaches. Negative for dizziness, seizures, speech difficulty and weakness.  Hematological: Negative for adenopathy. Does not bruise/bleed easily.  Psychiatric/Behavioral: Positive for dysphoric mood and sleep disturbance. Negative for  confusion and decreased concentration. The patient is not nervous/anxious.     Objective:  BP (!) 160/80 (BP Location: Left Arm, Patient Position: Sitting, Cuff Size: Large) Comment: BP (R) 160/80 (L) 146/84  Pulse 89   Temp 98.1 F (36.7 C) (Oral)   Resp 16   Ht 5\' 2"  (1.575 m)   Wt 149 lb (67.6 kg)   LMP 02/12/2014   SpO2 96%   BMI 27.25 kg/m   BP Readings from Last 3 Encounters:  07/29/20 132/64  07/25/20 (!) 160/80  05/09/20 (!) 146/78    Wt Readings from Last 3 Encounters:  07/29/20 149 lb 3.2 oz (67.7 kg)  07/25/20 149 lb (67.6 kg)  05/09/20 158 lb (71.7 kg)    Physical Exam  Lab Results  Component Value Date   WBC 9.3 05/09/2020   HGB 14.6 05/09/2020   HCT 42.5 05/09/2020   PLT 366.0 05/09/2020   GLUCOSE 130 (H) 07/29/2020  CHOL 102 09/01/2019   TRIG 91.0 09/01/2019   HDL 43.70 09/01/2019   LDLDIRECT 152.1 01/19/2013   LDLCALC 40 09/01/2019   ALT 16 04/27/2020   AST 20 04/27/2020   NA 140 07/29/2020   K 3.1 (L) 07/29/2020   CL 103 07/29/2020   CREATININE 0.69 07/29/2020   BUN 7 07/29/2020   CO2 32 07/29/2020   TSH 1.40 12/30/2017   INR 1.0 04/27/2020   HGBA1C 6.0 (H) 07/25/2020   MICROALBUR <0.7 05/09/2020    MR BRAIN W WO CONTRAST  Result Date: 04/27/2020 CLINICAL DATA:  Initial evaluation for right-sided weakness, syncopal episode. Evaluate for possible demyelinating disease. EXAM: MRI HEAD WITHOUT AND WITH CONTRAST MRI CERVICAL SPINE WITHOUT AND WITH CONTRAST MRI THORACIC SPINE WITHOUT AND WITH CONTRAST TECHNIQUE: Multiplanar, multiecho pulse sequences of the brain and surrounding structures, and cervical and thoracic spine, to include the craniocervical junction and cervicothoracic junction, were obtained without and with intravenous contrast. CONTRAST:  45mL GADAVIST GADOBUTROL 1 MMOL/ML IV SOLN COMPARISON:  Prior CT from earlier same day as well as previous brain MRI from 10/02/2017 and cervical spine MRI from 07/16/2019. FINDINGS: MRI HEAD  FINDINGS Brain: Cerebral volume within normal limits for patient age. No focal parenchymal signal abnormality identified. No abnormal foci of restricted diffusion to suggest acute or subacute ischemia. Gray-white matter differentiation well maintained. No encephalomalacia to suggest chronic infarction. No foci of susceptibility artifact to suggest acute or chronic intracranial hemorrhage. No mass lesion, midline shift or mass effect. No hydrocephalus. No extra-axial fluid collection. Major dural sinuses are grossly patent. Pituitary gland and suprasellar region are normal. Midline structures intact and normal. No abnormal enhancement. Vascular: Major intracranial vascular flow voids well maintained and normal in appearance. Skull and upper cervical spine: Craniocervical junction normal. Visualized upper cervical spine within normal limits. Bone marrow signal intensity normal. No scalp soft tissue abnormality. Sinuses/Orbits: Patient status post ocular lens replacement on the left. Globes and orbital soft tissues otherwise within normal limits. Paranasal sinuses are clear. Small right mastoid effusion noted, of doubtful significance. Left mastoid air cells are clear. Inner ear structures normal. Other: None. MRI CERVICAL SPINE FINDINGS Alignment: Straightening of the normal cervical lordosis. Trace retrolisthesis of C5 on C6, chronic and degenerative. Vertebrae: Vertebral body height maintained without evidence for acute or chronic fracture. Bone marrow signal intensity within normal limits. Discogenic reactive endplate changes present about the C5-6 interspace. No other abnormal marrow edema or enhancement. No evidence for osteomyelitis discitis or septic arthritis. Cord: Signal intensity within the cervical spinal cord is normal. No evidence for demyelinating disease. Normal cord caliber morphology. No abnormal enhancement. Posterior Fossa, vertebral arteries, paraspinal tissues: Visualized brain and posterior  fossa within normal limits. Craniocervical junction normal. Paraspinous and prevertebral soft tissues within normal limits. Normal intravascular flow voids seen within the vertebral arteries bilaterally. Disc levels: C2-C3: Unremarkable. C3-C4: Negative interspace. Mild right greater than left facet hypertrophy. No canal or foraminal stenosis. C4-C5: Mild disc bulge with uncovertebral hypertrophy. Right greater than left facet degeneration. Superimposed tiny central disc protrusion minimally indents the ventral thecal sac. Stable mild spinal stenosis without significant cord deformity. Mild right C5 foraminal stenosis. Left neural foramen remains patent. Appearance is stable. C5-C6: Retrolisthesis. Chronic intervertebral disc space narrowing with diffuse disc bulge and bilateral uncovertebral spurring. Superimposed mild facet and ligament flavum hypertrophy. Resultant mild spinal stenosis without cord deformity. Moderate left worse than right C6 foraminal narrowing, stable. C6-C7: Mild disc bulge with uncovertebral spurring. Bulging disc mildly flattens and  effaces the ventral thecal sac, greater on the right. No significant spinal stenosis or cord deformity. Mild right C7 foraminal stenosis. No significant left foraminal narrowing. Appearance is stable. C7-T1:  Normal interspace.  Mild facet hypertrophy.  No stenosis. MRI THORACIC SPINE FINDINGS Alignment: Trace dextroscoliosis. Alignment otherwise normal with preservation of the normal thoracic kyphosis. No listhesis. Vertebrae: Vertebral body height maintained without evidence for acute or chronic fracture. Bone marrow signal intensity within normal limits. No discrete or worrisome osseous lesions. No abnormal marrow edema or enhancement. Cord: Signal intensity within the thoracic spinal cord is normal. Normal cord caliber morphology. No signal changes to suggest demyelinating disease. No abnormal enhancement. Conus medullaris terminates at the L1 level.  Paraspinal tissues: Paraspinous soft tissues within normal limits. Partially visualized lungs are grossly clear. Visualized visceral structures unremarkable. Disc levels: No significant disc pathology seen within the thoracic spine. No significant disc bulge or focal disc herniation. No canal or foraminal stenosis. IMPRESSION: MRI HEAD IMPRESSION: Stable normal MRI of the brain, with no acute intracranial abnormality identified. No evidence for acute ischemia or demyelinating disease. MRI CERVICAL SPINE IMPRESSION: 1. Stable normal MRI appearance of the cervical spinal cord with no cord signal abnormality to suggest myelomalacia or demyelinating disease. 2. Degenerative spondylosis at C5-6 with resultant mild canal and moderate bilateral C6 foraminal stenosis. 3. Mild right C5 and C7 foraminal narrowing related to disc bulge and uncovertebral disease, stable. MRI THORACIC SPINE IMPRESSION: Normal MRI of the thoracic spine and spinal cord. No evidence for myelomalacia or demyelinating disease. No significant disc pathology or stenosis. Electronically Signed   By: Jeannine Boga M.D.   On: 04/27/2020 21:05   MR CERVICAL SPINE W WO CONTRAST  Result Date: 04/27/2020 CLINICAL DATA:  Initial evaluation for right-sided weakness, syncopal episode. Evaluate for possible demyelinating disease. EXAM: MRI HEAD WITHOUT AND WITH CONTRAST MRI CERVICAL SPINE WITHOUT AND WITH CONTRAST MRI THORACIC SPINE WITHOUT AND WITH CONTRAST TECHNIQUE: Multiplanar, multiecho pulse sequences of the brain and surrounding structures, and cervical and thoracic spine, to include the craniocervical junction and cervicothoracic junction, were obtained without and with intravenous contrast. CONTRAST:  7mL GADAVIST GADOBUTROL 1 MMOL/ML IV SOLN COMPARISON:  Prior CT from earlier same day as well as previous brain MRI from 10/02/2017 and cervical spine MRI from 07/16/2019. FINDINGS: MRI HEAD FINDINGS Brain: Cerebral volume within normal limits for  patient age. No focal parenchymal signal abnormality identified. No abnormal foci of restricted diffusion to suggest acute or subacute ischemia. Gray-white matter differentiation well maintained. No encephalomalacia to suggest chronic infarction. No foci of susceptibility artifact to suggest acute or chronic intracranial hemorrhage. No mass lesion, midline shift or mass effect. No hydrocephalus. No extra-axial fluid collection. Major dural sinuses are grossly patent. Pituitary gland and suprasellar region are normal. Midline structures intact and normal. No abnormal enhancement. Vascular: Major intracranial vascular flow voids well maintained and normal in appearance. Skull and upper cervical spine: Craniocervical junction normal. Visualized upper cervical spine within normal limits. Bone marrow signal intensity normal. No scalp soft tissue abnormality. Sinuses/Orbits: Patient status post ocular lens replacement on the left. Globes and orbital soft tissues otherwise within normal limits. Paranasal sinuses are clear. Small right mastoid effusion noted, of doubtful significance. Left mastoid air cells are clear. Inner ear structures normal. Other: None. MRI CERVICAL SPINE FINDINGS Alignment: Straightening of the normal cervical lordosis. Trace retrolisthesis of C5 on C6, chronic and degenerative. Vertebrae: Vertebral body height maintained without evidence for acute or chronic fracture. Bone marrow signal intensity  within normal limits. Discogenic reactive endplate changes present about the C5-6 interspace. No other abnormal marrow edema or enhancement. No evidence for osteomyelitis discitis or septic arthritis. Cord: Signal intensity within the cervical spinal cord is normal. No evidence for demyelinating disease. Normal cord caliber morphology. No abnormal enhancement. Posterior Fossa, vertebral arteries, paraspinal tissues: Visualized brain and posterior fossa within normal limits. Craniocervical junction normal.  Paraspinous and prevertebral soft tissues within normal limits. Normal intravascular flow voids seen within the vertebral arteries bilaterally. Disc levels: C2-C3: Unremarkable. C3-C4: Negative interspace. Mild right greater than left facet hypertrophy. No canal or foraminal stenosis. C4-C5: Mild disc bulge with uncovertebral hypertrophy. Right greater than left facet degeneration. Superimposed tiny central disc protrusion minimally indents the ventral thecal sac. Stable mild spinal stenosis without significant cord deformity. Mild right C5 foraminal stenosis. Left neural foramen remains patent. Appearance is stable. C5-C6: Retrolisthesis. Chronic intervertebral disc space narrowing with diffuse disc bulge and bilateral uncovertebral spurring. Superimposed mild facet and ligament flavum hypertrophy. Resultant mild spinal stenosis without cord deformity. Moderate left worse than right C6 foraminal narrowing, stable. C6-C7: Mild disc bulge with uncovertebral spurring. Bulging disc mildly flattens and effaces the ventral thecal sac, greater on the right. No significant spinal stenosis or cord deformity. Mild right C7 foraminal stenosis. No significant left foraminal narrowing. Appearance is stable. C7-T1:  Normal interspace.  Mild facet hypertrophy.  No stenosis. MRI THORACIC SPINE FINDINGS Alignment: Trace dextroscoliosis. Alignment otherwise normal with preservation of the normal thoracic kyphosis. No listhesis. Vertebrae: Vertebral body height maintained without evidence for acute or chronic fracture. Bone marrow signal intensity within normal limits. No discrete or worrisome osseous lesions. No abnormal marrow edema or enhancement. Cord: Signal intensity within the thoracic spinal cord is normal. Normal cord caliber morphology. No signal changes to suggest demyelinating disease. No abnormal enhancement. Conus medullaris terminates at the L1 level. Paraspinal tissues: Paraspinous soft tissues within normal limits.  Partially visualized lungs are grossly clear. Visualized visceral structures unremarkable. Disc levels: No significant disc pathology seen within the thoracic spine. No significant disc bulge or focal disc herniation. No canal or foraminal stenosis. IMPRESSION: MRI HEAD IMPRESSION: Stable normal MRI of the brain, with no acute intracranial abnormality identified. No evidence for acute ischemia or demyelinating disease. MRI CERVICAL SPINE IMPRESSION: 1. Stable normal MRI appearance of the cervical spinal cord with no cord signal abnormality to suggest myelomalacia or demyelinating disease. 2. Degenerative spondylosis at C5-6 with resultant mild canal and moderate bilateral C6 foraminal stenosis. 3. Mild right C5 and C7 foraminal narrowing related to disc bulge and uncovertebral disease, stable. MRI THORACIC SPINE IMPRESSION: Normal MRI of the thoracic spine and spinal cord. No evidence for myelomalacia or demyelinating disease. No significant disc pathology or stenosis. Electronically Signed   By: Jeannine Boga M.D.   On: 04/27/2020 21:05   MR THORACIC SPINE W WO CONTRAST  Result Date: 04/27/2020 CLINICAL DATA:  Initial evaluation for right-sided weakness, syncopal episode. Evaluate for possible demyelinating disease. EXAM: MRI HEAD WITHOUT AND WITH CONTRAST MRI CERVICAL SPINE WITHOUT AND WITH CONTRAST MRI THORACIC SPINE WITHOUT AND WITH CONTRAST TECHNIQUE: Multiplanar, multiecho pulse sequences of the brain and surrounding structures, and cervical and thoracic spine, to include the craniocervical junction and cervicothoracic junction, were obtained without and with intravenous contrast. CONTRAST:  7mL GADAVIST GADOBUTROL 1 MMOL/ML IV SOLN COMPARISON:  Prior CT from earlier same day as well as previous brain MRI from 10/02/2017 and cervical spine MRI from 07/16/2019. FINDINGS: MRI HEAD FINDINGS Brain: Cerebral volume  within normal limits for patient age. No focal parenchymal signal abnormality identified.  No abnormal foci of restricted diffusion to suggest acute or subacute ischemia. Gray-white matter differentiation well maintained. No encephalomalacia to suggest chronic infarction. No foci of susceptibility artifact to suggest acute or chronic intracranial hemorrhage. No mass lesion, midline shift or mass effect. No hydrocephalus. No extra-axial fluid collection. Major dural sinuses are grossly patent. Pituitary gland and suprasellar region are normal. Midline structures intact and normal. No abnormal enhancement. Vascular: Major intracranial vascular flow voids well maintained and normal in appearance. Skull and upper cervical spine: Craniocervical junction normal. Visualized upper cervical spine within normal limits. Bone marrow signal intensity normal. No scalp soft tissue abnormality. Sinuses/Orbits: Patient status post ocular lens replacement on the left. Globes and orbital soft tissues otherwise within normal limits. Paranasal sinuses are clear. Small right mastoid effusion noted, of doubtful significance. Left mastoid air cells are clear. Inner ear structures normal. Other: None. MRI CERVICAL SPINE FINDINGS Alignment: Straightening of the normal cervical lordosis. Trace retrolisthesis of C5 on C6, chronic and degenerative. Vertebrae: Vertebral body height maintained without evidence for acute or chronic fracture. Bone marrow signal intensity within normal limits. Discogenic reactive endplate changes present about the C5-6 interspace. No other abnormal marrow edema or enhancement. No evidence for osteomyelitis discitis or septic arthritis. Cord: Signal intensity within the cervical spinal cord is normal. No evidence for demyelinating disease. Normal cord caliber morphology. No abnormal enhancement. Posterior Fossa, vertebral arteries, paraspinal tissues: Visualized brain and posterior fossa within normal limits. Craniocervical junction normal. Paraspinous and prevertebral soft tissues within normal limits.  Normal intravascular flow voids seen within the vertebral arteries bilaterally. Disc levels: C2-C3: Unremarkable. C3-C4: Negative interspace. Mild right greater than left facet hypertrophy. No canal or foraminal stenosis. C4-C5: Mild disc bulge with uncovertebral hypertrophy. Right greater than left facet degeneration. Superimposed tiny central disc protrusion minimally indents the ventral thecal sac. Stable mild spinal stenosis without significant cord deformity. Mild right C5 foraminal stenosis. Left neural foramen remains patent. Appearance is stable. C5-C6: Retrolisthesis. Chronic intervertebral disc space narrowing with diffuse disc bulge and bilateral uncovertebral spurring. Superimposed mild facet and ligament flavum hypertrophy. Resultant mild spinal stenosis without cord deformity. Moderate left worse than right C6 foraminal narrowing, stable. C6-C7: Mild disc bulge with uncovertebral spurring. Bulging disc mildly flattens and effaces the ventral thecal sac, greater on the right. No significant spinal stenosis or cord deformity. Mild right C7 foraminal stenosis. No significant left foraminal narrowing. Appearance is stable. C7-T1:  Normal interspace.  Mild facet hypertrophy.  No stenosis. MRI THORACIC SPINE FINDINGS Alignment: Trace dextroscoliosis. Alignment otherwise normal with preservation of the normal thoracic kyphosis. No listhesis. Vertebrae: Vertebral body height maintained without evidence for acute or chronic fracture. Bone marrow signal intensity within normal limits. No discrete or worrisome osseous lesions. No abnormal marrow edema or enhancement. Cord: Signal intensity within the thoracic spinal cord is normal. Normal cord caliber morphology. No signal changes to suggest demyelinating disease. No abnormal enhancement. Conus medullaris terminates at the L1 level. Paraspinal tissues: Paraspinous soft tissues within normal limits. Partially visualized lungs are grossly clear. Visualized visceral  structures unremarkable. Disc levels: No significant disc pathology seen within the thoracic spine. No significant disc bulge or focal disc herniation. No canal or foraminal stenosis. IMPRESSION: MRI HEAD IMPRESSION: Stable normal MRI of the brain, with no acute intracranial abnormality identified. No evidence for acute ischemia or demyelinating disease. MRI CERVICAL SPINE IMPRESSION: 1. Stable normal MRI appearance of the cervical spinal cord  with no cord signal abnormality to suggest myelomalacia or demyelinating disease. 2. Degenerative spondylosis at C5-6 with resultant mild canal and moderate bilateral C6 foraminal stenosis. 3. Mild right C5 and C7 foraminal narrowing related to disc bulge and uncovertebral disease, stable. MRI THORACIC SPINE IMPRESSION: Normal MRI of the thoracic spine and spinal cord. No evidence for myelomalacia or demyelinating disease. No significant disc pathology or stenosis. Electronically Signed   By: Jeannine Boga M.D.   On: 04/27/2020 21:05   CT HEAD CODE STROKE WO CONTRAST  Result Date: 04/27/2020 CLINICAL DATA:  Code stroke. Neuro deficit, acute, stroke suspected. Additional history provided: 11 a.m. last known normal, right-sided weakness/numbness, syncopal episode. EXAM: CT HEAD WITHOUT CONTRAST TECHNIQUE: Contiguous axial images were obtained from the base of the skull through the vertex without intravenous contrast. COMPARISON:  CTA head/neck 07/16/2019 FINDINGS: Brain: There is no acute intracranial hemorrhage. No demarcated cortical infarct. No extra-axial fluid collection. No evidence of intracranial mass. No midline shift. Vascular: No hyperdense vessel. Skull: Normal. Negative for fracture or focal lesion. Sinuses/Orbits: Visualized orbits show no acute finding. No significant paranasal sinus disease or mastoid effusion at the imaged levels. ASPECTS [facility] Stroke Program Early CT Score) - Ganglionic level infarction (caudate, lentiform nuclei, internal capsule,  insula, M1-M3 cortex): 7 - Supraganglionic infarction (M4-M6 cortex): 3 Total score (0-10 with 10 being normal): 10 These results were communicated to Dr. Cheral Marker At 4:52 pmon 5/5/2021by text page via the [facility] messaging system. IMPRESSION: No CT evidence of acute intracranial abnormality.  ASPECTS is 10. Electronically Signed   By: Kellie Simmering DO   On: 04/27/2020 16:53   Narrative & Impression Sinus rhythm Zero atrial fibrillation Rare PVCs Short runs (<20 beats) of SVT without symptoms Symptoms of palpitations, chest pain associated with sinus rhythm    Assessment & Plan:   Kelsha was seen today for hypertension and diabetes.  Diagnoses and all orders for this visit:  Essential hypertension-her blood pressure is not adequately well controlled.  I recommended that she start an ARB. -     BASIC METABOLIC PANEL WITH GFR; Future -     irbesartan (AVAPRO) 150 MG tablet; Take 1 tablet (150 mg total) by mouth daily. -     BASIC METABOLIC PANEL WITH GFR  Diabetes mellitus with complication (HCC)-her R4Y is at 6.0%.  Her blood sugars are adequately well controlled. -     BASIC METABOLIC PANEL WITH GFR; Future -     Hemoglobin A1c; Future -     irbesartan (AVAPRO) 150 MG tablet; Take 1 tablet (150 mg total) by mouth daily. -     Hemoglobin A1c -     BASIC METABOLIC PANEL WITH GFR  Hypokalemia-her potassium level remains low.  I have asked her to be more compliant with the potassium supplement. -     BASIC METABOLIC PANEL WITH GFR; Future -     Magnesium; Future -     Magnesium -     BASIC METABOLIC PANEL WITH GFR -     potassium chloride SA (KLOR-CON) 20 MEQ tablet; Take 1 tablet (20 mEq total) by mouth 2 (two) times daily.  Thiamine deficiency-her thiamine level is normal now. -     Vitamin B1; Future -     Vitamin B1  Paroxysmal SVT (supraventricular tachycardia) (HCC)-I have asked her to see cardiology to see if the SVT needs to be treated to prevent episodes of syncope. -      Ambulatory referral to Cardiology  Menopausal  vaginal dryness -     Discontinue: conjugated estrogens (PREMARIN) vaginal cream; Place 1 Applicatorful vaginally daily.   I am having Sylwia B. Brow start on irbesartan. I am also having her maintain her Adjustable Lancing Device, AquaLance Lancets 30G, Bayer Contour Test, Insulin Pen Needle, aspirin EC, cholecalciferol, nitroGLYCERIN, [name], dicyclomine, albuterol, venlafaxine XR, Nurtec, FeroSul, promethazine, Botox, insulin detemir, clonazePAM, traZODone, rosuvastatin, fludrocortisone, and potassium chloride SA.  Meds ordered this encounter  Medications  . irbesartan (AVAPRO) 150 MG tablet    Sig: Take 1 tablet (150 mg total) by mouth daily.    Dispense:  90 tablet    Refill:  1  . DISCONTD: conjugated estrogens (PREMARIN) vaginal cream    Sig: Place 1 Applicatorful vaginally daily.    Dispense:  42.5 g    Refill:  0  . potassium chloride SA (KLOR-CON) 20 MEQ tablet    Sig: Take 1 tablet (20 mEq total) by mouth 2 (two) times daily.    Dispense:  180 tablet    Refill:  0   I spent 50 minutes in preparing to see the patient by review of recent labs, imaging and procedures, obtaining and reviewing separately obtained history, communicating with the patient and family or caregiver, ordering medications, tests or procedures, and documenting clinical information in the EHR including the differential Dx, treatment, and any further evaluation and other management of 1. Essential hypertension 2. Diabetes mellitus with complication ([place]) 3. Hypokalemia 4. Thiamine deficiency 5. Paroxysmal SVT (supraventricular tachycardia) (HCC) 6. Menopausal vaginal dryness     Follow-up: Return in about 3 months (around 10/25/2020).  Scarlette Calico, MD

## 2020-07-26 ENCOUNTER — Other Ambulatory Visit: Payer: Self-pay | Admitting: Internal Medicine

## 2020-07-26 DIAGNOSIS — N951 Menopausal and female climacteric states: Secondary | ICD-10-CM

## 2020-07-26 LAB — MAGNESIUM: Magnesium: 2.1 mg/dL (ref 1.5–2.5)

## 2020-07-28 ENCOUNTER — Other Ambulatory Visit: Payer: Self-pay | Admitting: Family Medicine

## 2020-07-28 MED ORDER — ETODOLAC 400 MG PO TABS: 400.0000 mg | ORAL_TABLET | Freq: Two times a day (BID) | ORAL | 3 refills | Status: DC | PRN

## 2020-07-29 ENCOUNTER — Telehealth: Payer: Self-pay | Admitting: Internal Medicine

## 2020-07-29 ENCOUNTER — Other Ambulatory Visit: Payer: Self-pay

## 2020-07-29 ENCOUNTER — Other Ambulatory Visit (INDEPENDENT_AMBULATORY_CARE_PROVIDER_SITE_OTHER): Payer: 59

## 2020-07-29 ENCOUNTER — Other Ambulatory Visit: Payer: Self-pay | Admitting: *Deleted

## 2020-07-29 ENCOUNTER — Ambulatory Visit (INDEPENDENT_AMBULATORY_CARE_PROVIDER_SITE_OTHER): Payer: 59 | Admitting: Physician Assistant

## 2020-07-29 ENCOUNTER — Encounter: Payer: Self-pay | Admitting: Physician Assistant

## 2020-07-29 VITALS — BP 132/64 | HR 96 | Ht 62.0 in | Wt 149.2 lb

## 2020-07-29 DIAGNOSIS — I6523 Occlusion and stenosis of bilateral carotid arteries: Secondary | ICD-10-CM

## 2020-07-29 DIAGNOSIS — I10 Essential (primary) hypertension: Secondary | ICD-10-CM

## 2020-07-29 DIAGNOSIS — R55 Syncope and collapse: Secondary | ICD-10-CM

## 2020-07-29 DIAGNOSIS — E876 Hypokalemia: Secondary | ICD-10-CM

## 2020-07-29 DIAGNOSIS — R0789 Other chest pain: Secondary | ICD-10-CM | POA: Diagnosis not present

## 2020-07-29 DIAGNOSIS — I471 Supraventricular tachycardia, unspecified: Secondary | ICD-10-CM

## 2020-07-29 DIAGNOSIS — E1165 Type 2 diabetes mellitus with hyperglycemia: Secondary | ICD-10-CM | POA: Diagnosis not present

## 2020-07-29 DIAGNOSIS — E519 Thiamine deficiency, unspecified: Secondary | ICD-10-CM

## 2020-07-29 DIAGNOSIS — N951 Menopausal and female climacteric states: Secondary | ICD-10-CM

## 2020-07-29 LAB — TSH: TSH: 2.02 u[IU]/mL (ref 0.35–4.50)

## 2020-07-29 LAB — BASIC METABOLIC PANEL
BUN: 7 mg/dL (ref 6–23)
CO2: 32 mEq/L (ref 19–32)
Calcium: 9.3 mg/dL (ref 8.4–10.5)
Chloride: 103 mEq/L (ref 96–112)
Creatinine, Ser: 0.69 mg/dL (ref 0.40–1.20)
GFR: 88.48 mL/min (ref >60.00)
Glucose, Bld: 130 mg/dL — ABNORMAL HIGH (ref 70–99)
Potassium: 3.1 mEq/L — ABNORMAL LOW (ref 3.5–5.1)
Sodium: 140 mEq/L (ref 135–145)

## 2020-07-29 MED ORDER — PREMARIN 0.625 MG/GM VA CREA: 1.0000 | TOPICAL_CREAM | Freq: Every day | VAGINAL | 0 refills | Status: DC

## 2020-07-29 MED ORDER — TRULICITY 0.75 MG/0.5ML ~~LOC~~ SOAJ: 0.7500 mg | SUBCUTANEOUS | 3 refills | Status: DC

## 2020-07-29 MED ORDER — METOPROLOL TARTRATE 25 MG PO TABS
25.0000 mg | ORAL_TABLET | Freq: Two times a day (BID) | ORAL | 3 refills | Status: DC
Start: 2020-07-29 — End: 2021-02-22

## 2020-07-29 NOTE — Patient Instructions (Addendum)
Medication Instructions:  Lopressor Tartrate 25mg  (1 Tablet Twice Daily) *If you need a refill on your cardiac medications before your next appointment, please call your pharmacy*   Lab Work: BMP, TSH If you have labs (blood work) drawn today and your tests are completely normal, you will receive your results only by: Marland Kitchen MyChart Message (if you have MyChart) OR . A paper copy in the mail If you have any lab test that is abnormal or we need to change your treatment, we will call you to review the results.   Testing/Procedure  Your physician has requested that you have a carotid duplex. This test is an ultrasound of the carotid arteries in your neck. It looks at blood flow through these arteries that supply the brain with blood. Allow one hour for this exam. There are no restrictions or special instructions. [place]     Follow-Up: At [facility] you and your health needs are our priority.  As part of our continuing mission to provide you with exceptional heart care, we have created designated Provider Care Teams.  These Care Teams include your primary Cardiologist (physician) and Advanced Practice Providers (APPs -  Physician Assistants and Nurse Practitioners) who all work together to provide you with the care you need, when you need it.  Your next appointment:   1 year(s)  The format for your next appointment:   In Person  Provider:   Sanda Klein, MD   Other Instructions  Compression Stockings as Tolerated

## 2020-07-29 NOTE — Addendum Note (Signed)
Addended by: Trenda Moots on: 01/28/4861 05:02 PM   Modules accepted: Orders

## 2020-07-29 NOTE — Telephone Encounter (Signed)
    Patient calling for status of Dulaglutide (TRULICITY) 2.35 TI/1.4ER SOPN refill to pof Patient also requesting call to discuss lab results and Neurology referral

## 2020-07-29 NOTE — Telephone Encounter (Signed)
Erx for Trulicity sent. I will cancel the rx if needed.   Pt contacted and informed we are waiting on labs from the 2nd.   I asked pt if she could go to [facility] and have labs redrawn. The bmet, a1c and vitamin b1 were not drawn. Pt will go today.  We will have the results for the A1c by end of today because it will be drawn and resulted by [facility].

## 2020-07-29 NOTE — Progress Notes (Signed)
Cardiology Office Note   Date:  07/29/2020   ID:  Ashley [name], DOB 13-Oct-1966, MRN [id number]  PCP:  Janith Lima, MD Cardiologist:  Sanda Klein, MD 03/09/2019 Electrphysiologist: None Rosaria Ferries, PA-C   No chief complaint on file.   History of Present Illness: Ashley [name] is a 54 y.o. female with a history of DM2, HTN, tobacco, migraines, insomnia, anxiety, COPD, cath 07/2016 with no CAD, admitted for chest pressure 01/2019  Ashley [name] presents for cardiology follow up.  She is concerned about her health.  She is dealing with several things.  She has a lot of chest pain. When she wore the monitor, she hit it for CP, but not for palpitations. She gets CP about 2 x week. It starts at rest and also with exertion. Sometimes it travels back around to her neck and/or down her L arm. It can last for hours. Can last 10-15 minutes. She will lie down to help the CP resolve. SL Nitro no help. She has tried Maalox - no help.   She gets palpitations, they can start at rest and last 1-2 minutes. No hx syncope associated w/ palpitations.   If she stands for any length of time, she will get light-headed, feel her heart speed up, get SOB, then her neck will start to sweat and hurt. She knows that if she does not sit down, she will lose consciousness. She has lost consciousness 3 times in the last 3 months. The last episode happened when she was trying to cook breakfast and woke up on the floor. Her husband says that she took a few minutes to come around and speak, although he had helped her sit up.    She tries to do things around the house, but has to stop frequently because of her symptoms.  She has been losing weight, has lost about 30 pounds.  She was told at one point that her carotid arteries were partly blocked, this was after a CT scan of her neck done in 2017.  She has never had carotid Dopplers.   Past Medical History:  Diagnosis Date  . Anxiety   . Atypical ductal  hyperplasia of left breast   . Bronchitis    uses inhaler if needed for bronchitis, lasted used -2014  . Cataract    bilateral  . Chronic headaches    migraines - in past, uses Phenergan for nausea   . COPD (chronic obstructive pulmonary disease) ([place])   . Deficiency anemia 12/30/2017  . Depression   . Diabetes mellitus without complication ([place])    taken off Metformin since 04/2014, HgbA1C - normal, will follow up with PCP- Dr. Deborra Medina, 07/2014  . GERD (gastroesophageal reflux disease)   . H/O exercise stress test 2011   done at [facility]- told that it was WNL, done due to pt. having panic attacks   . History of blood transfusion 01[birth year]18[birth year]66   at birth in [place], [place], unsure number of units  . PONV (postoperative nausea and vomiting)   . Poor dentition    very poor oral health   . SVD (spontaneous vaginal delivery)    x 3  . Tobacco abuse   . Wears glasses     Past Surgical History:  Procedure Laterality Date  . ABDOMINAL HYSTERECTOMY    . APPENDECTOMY  1988  . BREAST LUMPECTOMY WITH RADIOACTIVE SEED LOCALIZATION Left 03/17/2020   Procedure: LEFT BREAST LUMPECTOMY WITH RADIOACTIVE SEED LOCALIZATION;  Surgeon: Coralie Keens,  MD;  Location: [place];  Service: General;  Laterality: Left;  LMA  . CARDIAC CATHETERIZATION N/A 08/17/2016   Procedure: Left Heart Cath and Coronary Angiography;  Surgeon: Burnell Blanks, MD;  Location: [place] [facility];  Service: Cardiovascular;  Laterality: N/A;  . CHOLECYSTECTOMY N/A 06/07/2014   Procedure: LAPAROSCOPIC CHOLECYSTECTOMY;  Surgeon: Ralene Ok, MD;  Location: [place];  Service: General;  Laterality: N/A;  . CHOLECYSTECTOMY  June 07 2014  . COLONOSCOPY  02/16/2014   normal   . ESOPHAGOGASTRODUODENOSCOPY  12/28/2013  . KNEE ARTHROSCOPY  1995   left  . LAPAROSCOPIC ASSISTED VAGINAL HYSTERECTOMY N/A 04/15/2014   Procedure: LAPAROSCOPIC ASSISTED VAGINAL HYSTERECTOMY;  Surgeon: Marylynn Pearson, MD;  Location: [facility];  Service:  Gynecology;  Laterality: N/A;  . LAPAROSCOPIC BILATERAL SALPINGO OOPHERECTOMY Bilateral 04/15/2014   Procedure: LAPAROSCOPIC BILATERAL SALPINGO OOPHORECTOMY;  Surgeon: Marylynn Pearson, MD;  Location: [facility];  Service: Gynecology;  Laterality: Bilateral;  . TUBAL LIGATION      Current Outpatient Medications  Medication Sig Dispense Refill  . albuterol (VENTOLIN HFA) 108 (90 Base) MCG/ACT inhaler INHALE 2 PUFFS INTO THE LUNGS EVERY 6 HOURS AS NEEDED FOR WHEEZING OR SHORTNESS OF BREATH (Patient taking differently: Inhale 2 puffs into the lungs every 6 (six) hours as needed for wheezing or shortness of breath. ) 6.7 g 3  . AQUALANCE LANCETS 30G MISC   98  . aspirin EC 81 MG tablet Take 1 tablet (81 mg total) by mouth daily. (Patient taking differently: Take 81 mg by mouth at bedtime. ) 90 tablet 1  . BAYER CONTOUR TEST test strip Testing for up to tid 100 each 98  . Botulinum Toxin Type A (BOTOX) 200 units SOLR INJECT 155 UNITS IN THE MUSCLE INTO HEAD AND NECK MUSCLES EVERY 3 MONTHS BY PROVIDER. DISCARD REMAINDER. 1 each 0  . cholecalciferol (VITAMIN D3) 25 MCG (1000 UT) tablet Take 2,000 Units by mouth daily.    . clonazePAM (KLONOPIN) 1 MG tablet TAKE 1 TABLET(1 MG) BY MOUTH TWICE DAILY 60 tablet 2  . conjugated estrogens (PREMARIN) vaginal cream Place 1 Applicatorful vaginally daily. 30 g 0  . dicyclomine (BENTYL) 10 MG capsule Take 1 capsule (10 mg total) by mouth 2 (two) times daily as needed (Cramping and abdominal pain). (Patient taking differently: Take 10 mg by mouth in the morning and at bedtime. ) 180 capsule 1  . Dulaglutide (TRULICITY) 2.29 NL/8.9QJ SOPN Inject 0.5 mLs (0.75 mg total) as directed once a week. 6 mL 3  . estradiol (CLIMARA - DOSED IN MG/24 HR) 0.0375 mg/24hr patch Place 1 patch (0.0375 mg total) onto the skin once a week. 12 patch 1  . etodolac (LODINE) 400 MG tablet Take 1 tablet (400 mg total) by mouth 2 (two) times daily as needed for mild pain (Back Pain). 60 tablet 3    . FEROSUL 325 (65 Fe) MG tablet TAKE 1 TABLET(325 MG) BY MOUTH TWICE DAILY WITH A MEAL (Patient taking differently: Take 325 mg by mouth 2 (two) times daily with a meal. ) 180 tablet 1  . fludrocortisone (FLORINEF) 0.1 MG tablet TAKE 1 TABLET(0.1 MG) BY MOUTH DAILY 90 tablet 0  . Fremanezumab-vfrm ([name]) 225 MG/1.5ML SOSY Inject 225 mg into the skin every 30 (thirty) days. 1.5 mL 11  . insulin detemir (LEVEMIR) 100 UNIT/ML injection Inject 0.3 mLs (30 Units total) into the skin daily. 10 mL 1  . Insulin Pen Needle 32G X 6 MM MISC Use daily as directed 100  each 1  . irbesartan (AVAPRO) 150 MG tablet Take 1 tablet (150 mg total) by mouth daily. 90 tablet 1  . Lancet Devices (ADJUSTABLE LANCING DEVICE) MISC Up to tid testing  98  . nitroGLYCERIN (NITROSTAT) 0.4 MG SL tablet Place 1 tablet (0.4 mg total) under the tongue every 5 (five) minutes x 3 doses as needed for chest pain. 30 tablet 5  . pantoprazole (PROTONIX) 40 MG tablet Take 1 tablet (40 mg total) by mouth daily. 90 tablet 1  . potassium chloride SA (KLOR-CON) 20 MEQ tablet Take 1 tablet (20 mEq total) by mouth 2 (two) times daily. 180 tablet 0  . promethazine (PHENERGAN) 25 MG tablet TAKE 1 TABLET(25 MG) BY MOUTH EVERY 6 HOURS AS NEEDED FOR NAUSEA OR VOMITING (Patient taking differently: Take 25 mg by mouth every 6 (six) hours as needed for nausea or vomiting. ) 30 tablet 1  . Rimegepant Sulfate (NURTEC) 75 MG TBDP Take 75 mg by mouth daily as needed. For migraines. Take as close to onset of migraine as possible. One daily maximum. (Patient taking differently: Take 75 mg by mouth daily as needed (For migraines). ) 10 tablet 6  . rosuvastatin (CRESTOR) 20 MG tablet TAKE 1 TABLET BY MOUTH EVERY DAY, NEED OFFICE VISIT FOR MORE REFILLS 90 tablet 1  . traZODone (DESYREL) 150 MG tablet TAKE 1 TABLET(150 MG) BY MOUTH AT BEDTIME 90 tablet 1  . venlafaxine XR (EFFEXOR-XR) 150 MG 24 hr capsule Take 1 capsule (150 mg total) by mouth daily with  breakfast. (Patient taking differently: Take 150 mg by mouth daily. ) 90 capsule 3   No current facility-administered medications for this visit.    Allergies:   Penicillins, Topamax [topiramate], and Metformin and related    Social History:  The patient  reports that she has been smoking cigarettes. She has a 52.50 pack-year smoking history. She has never used smokeless tobacco. She reports that she does not drink alcohol and does not use drugs.   Family History:  The patient's family history includes Alcohol abuse in her father; Anxiety disorder in her mother; COPD in her maternal grandmother; Dementia in her maternal grandmother; Depression in her mother; Diabetes in her mother; Drug abuse in her mother; Emphysema in her maternal grandfather; Hyperlipidemia in her mother; Hypertension in her mother; Lung cancer in her maternal grandfather; Prostate cancer in her father; Stroke in her mother; Thyroid cancer in her paternal aunt.  She indicated that her mother is deceased. She indicated that her father is alive. She indicated that her maternal grandmother is deceased. She indicated that her maternal grandfather is deceased. She indicated that her paternal grandmother is deceased. She indicated that her paternal grandfather is deceased. She indicated that the status of her paternal aunt is unknown. She indicated that the status of her neg hx is unknown.   ROS:  Please see the history of present illness. All other systems are reviewed and negative.    PHYSICAL EXAM: VS:  BP 132/64   Pulse 96   Ht 5\' 2"  (1.575 m)   Wt 149 lb 3.2 oz (67.7 kg)   LMP 02/12/2014   SpO2 98%   BMI 27.29 kg/m  , BMI Body mass index is 27.29 kg/m. GEN: Well nourished, well developed, female in no acute distress HEENT: normal for age  Neck: no JVD, no carotid bruit, no masses Cardiac: RRR; no murmur, no rubs, or gallops Respiratory:  clear to auscultation bilaterally, normal work of breathing GI: soft,  nontender, nondistended, + BS MS: no deformity or atrophy; no edema; distal pulses are 2+ in all 4 extremities  Skin: warm and dry, no rash Neuro:  Strength and sensation are intact Psych: euthymic mood, full affect   EKG:  EKG is ordered today. The ekg ordered today demonstrates sinus rhythm with possible left atrial enlargement, nonspecific ST and T wave abnormalities are not significantly different from 05/26/2020 ECG  ECHO: 02/18/2019 1. The left ventricle has normal systolic function with an ejection  fraction of 60-65%. The cavity size was normal. Left ventricular diastolic  parameters were normal.  2. The right ventricle has normal systolic function. The cavity was  normal. There is no increase in right ventricular wall thickness.  3. The mitral valve is normal in structure.  4. The tricuspid valve is normal in structure.  5. The aortic valve is tricuspid.  6. The pulmonic valve was normal in structure.   Echocardiogram 08/15/2016: Study Conclusions: - Left ventricle: The cavity size was normal. Wall thickness was normal. Systolic function was normal. The estimated ejection fraction was in the range of 60% to 65%. Wall motion was normal; there were no regional wall motion abnormalities. Left ventricular diastolic function parameters were normal. - Aortic valve: There was no stenosis. - Mitral valve: There was no significant regurgitation. - Right ventricle: The cavity size was normal. Systolic function was normal. - Pulmonary arteries: No complete TR doppler jet so unable to estimate PA systolic pressure. - Inferior vena cava: The vessel was normal in size. The respirophasic diameter changes were in the normal range (>= 50%), consistent with normal central venous pressure.  Impressions: - Normal study. _______________  Left Heart Catheterization 08/17/2016: 1. No angiographic evidence of CAD 2. Normal LV systolic function 3. Normal LV filling  pressures  Recommendations: No further ischemic workup.  MONITOR: 04/2020 Sinus rhythm Zero atrial fibrillation Rare PVCs Short runs (<20 beats) of SVT without symptoms Symptoms of palpitations, chest pain associated with sinus rhythm  CT SOFT TISSUE NECK: 02/07/2016 IMPRESSION: 1. No mass or adenopathy to explain the left neck complaint. 2. Carotid bifurcation atherosclerosis, prominent for age.  Recent Labs: 04/27/2020: ALT 16 05/09/2020: BUN 8; Creatinine, Ser 0.65; Hemoglobin 14.6; Platelets 366.0; Potassium 3.2; Sodium 139 07/25/2020: Magnesium 2.1  CBC    Component Value Date/Time   WBC 9.3 05/09/2020 1501   RBC 4.53 05/09/2020 1501   HGB 14.6 05/09/2020 1501   HCT 42.5 05/09/2020 1501   PLT 366.0 05/09/2020 1501   MCV 93.7 05/09/2020 1501   MCH 31.6 04/27/2020 1636   MCHC 34.5 05/09/2020 1501   RDW 13.8 05/09/2020 1501   LYMPHSABS 2.4 05/09/2020 1501   MONOABS 0.7 05/09/2020 1501   EOSABS 0.2 05/09/2020 1501   BASOSABS 0.1 05/09/2020 1501   CMP Latest Ref Rng & Units 05/09/2020 04/27/2020 04/27/2020  Glucose 70 - 99 mg/dL 150(H) 165(H) 176(H)  BUN 6 - 23 mg/dL 8 5(L) 6  Creatinine 0.40 - 1.20 mg/dL 0.65 0.60 0.79  Sodium 135 - 145 mEq/L 139 142 141  Potassium 3.5 - 5.1 mEq/L 3.2(L) 3.0(L) 3.1(L)  Chloride 96 - 112 mEq/L 102 105 109  CO2 19 - 32 mEq/L 30 - 22  Calcium 8.4 - 10.5 mg/dL 9.0 - 8.4(L)  Total Protein 6.5 - 8.1 g/dL - - 5.7(L)  Total Bilirubin 0.3 - 1.2 mg/dL - - 0.7  Alkaline Phos 38 - 126 U/L - - 83  AST 15 - 41 U/L - - 20  ALT 0 -  44 U/L - - 16     Lipid Panel Lab Results  Component Value Date   CHOL 102 09/01/2019   HDL 43.70 09/01/2019   LDLCALC 40 09/01/2019   LDLDIRECT 152.1 01/19/2013   TRIG 91.0 09/01/2019   CHOLHDL 2 09/01/2019      Wt Readings from Last 3 Encounters:  07/29/20 149 lb 3.2 oz (67.7 kg)  07/25/20 149 lb (67.6 kg)  05/09/20 158 lb (71.7 kg)     Other studies Reviewed: Additional studies/ records that were  reviewed today include: Office notes, hospital records and testing.  ASSESSMENT AND PLAN:  1.  Syncope: -Her symptoms are concerning for autonomic dysfunction. - She has been on Florinef 0.1 mg daily for a year with no change in her symptoms. -I will refer her to Dr. Caryl Comes for further evaluation -We will order compression stockings, she is encouraged to wear them as she is able although that may be difficult in the heat. -Check a TSH  2.  Hypertension: -She was recently started on irbesartan 150 mg daily. -Her blood pressure is high enough today that I believe she will tolerate the addition of the beta-blocker -Add Lopressor 25 mg twice daily  3.  Palpitations, SVT: -She had brief runs of SVT seen on to her monitor. -She did not realize she was supposed to hit the monitor for palpitations, which she feels. -I reassured her that the palpitations are not coming from anything which could be harmful to her -Add beta-blocker and see if this helps her palpitations  4.  Atypical chest pain -I explained the reasons that I do not feel her chest pain is cardiac (started at rest, can last for hours, no improvement with nitroglycerin) -At this time, no additional cardiac work-up is indicated. -She is encouraged to try NSAIDs or Tylenol for the pain  5. Carotid disease - on a CT of her neck from 2017, she had carotid atherosclerosis - no bruits detected, ck Dopplers for a baseline  Of note, her lifetime radiation exposure is quite high.  Current medicines are reviewed at length with the patient today.  The patient does not have concerns regarding medicines.  The following changes have been made:  Add Lopressor  Labs/ tests ordered today include:  No orders of the defined types were placed in this encounter.    Disposition:   FU with Sanda Klein, MD  Signed, Rosaria Ferries, PA-C  07/29/2020 2:53 PM    [place] Phone: [phone]; Fax: [phone]

## 2020-07-30 MED ORDER — POTASSIUM CHLORIDE CRYS ER 20 MEQ PO TBCR: 20.0000 meq | EXTENDED_RELEASE_TABLET | Freq: Two times a day (BID) | ORAL | 0 refills | Status: DC

## 2020-07-30 NOTE — Progress Notes (Signed)
Is the irbesartan for renal protection? Seems counterproductive to give to her when she is on fludrocortisone.

## 2020-07-31 LAB — BASIC METABOLIC PANEL WITH GFR
BUN: 8 mg/dL (ref 7–25)
CO2: 28 mmol/L (ref 20–32)
Calcium: 9.5 mg/dL (ref 8.6–10.4)
Chloride: 101 mmol/L (ref 98–110)
Creat: 0.69 mg/dL (ref 0.50–1.05)
GFR, Est African American: 114 mL/min/{1.73_m2} (ref >=60)
GFR, Est Non African American: 99 mL/min/{1.73_m2} (ref >=60)
Glucose, Bld: 177 mg/dL — ABNORMAL HIGH (ref 65–99)
Potassium: 3.6 mmol/L (ref 3.5–5.3)
Sodium: 140 mmol/L (ref 135–146)

## 2020-07-31 LAB — HEMOGLOBIN A1C
Hgb A1c MFr Bld: 6 % of total Hgb — ABNORMAL HIGH (ref <5.7)
Mean Plasma Glucose: 126 (calc)
eAG (mmol/L): 7 (calc)

## 2020-07-31 LAB — VITAMIN B1: Vitamin B1 (Thiamine): 11 nmol/L (ref 8–30)

## 2020-08-01 LAB — HEMOGLOBIN A1C: Hgb A1c MFr Bld: 6.4 % (ref 4.6–6.5)

## 2020-08-01 NOTE — Progress Notes (Signed)
I had not thought about it but I see what you mean. Should we stop it? Thanks

## 2020-08-01 NOTE — Progress Notes (Signed)
Yes, let's stop it, please.

## 2020-08-02 ENCOUNTER — Other Ambulatory Visit: Payer: Self-pay | Admitting: Internal Medicine

## 2020-08-02 DIAGNOSIS — E519 Thiamine deficiency, unspecified: Secondary | ICD-10-CM

## 2020-08-02 LAB — VITAMIN B1: Vitamin B1 (Thiamine): 7 nmol/L — ABNORMAL LOW (ref 8–30)

## 2020-08-02 MED ORDER — VITAMIN B-1 50 MG PO TABS: 50.0000 mg | ORAL_TABLET | Freq: Every day | ORAL | 1 refills | Status: DC

## 2020-08-03 ENCOUNTER — Other Ambulatory Visit: Payer: Self-pay

## 2020-08-03 ENCOUNTER — Ambulatory Visit (HOSPITAL_COMMUNITY)
Admission: RE | Admit: 2020-08-03 | Discharge: 2020-08-03 | Disposition: A | Discharge status: Home / Self Care | Payer: 59 | Source: Ambulatory Visit | Attending: Cardiology | Admitting: Cardiology

## 2020-08-03 DIAGNOSIS — R0789 Other chest pain: Secondary | ICD-10-CM | POA: Diagnosis not present

## 2020-08-03 DIAGNOSIS — I10 Essential (primary) hypertension: Secondary | ICD-10-CM | POA: Diagnosis not present

## 2020-08-03 DIAGNOSIS — I471 Supraventricular tachycardia, unspecified: Secondary | ICD-10-CM

## 2020-08-03 DIAGNOSIS — R55 Syncope and collapse: Secondary | ICD-10-CM

## 2020-08-08 ENCOUNTER — Telehealth: Payer: Self-pay | Admitting: Internal Medicine

## 2020-08-08 NOTE — Telephone Encounter (Signed)
   Pt c/o medication issue:  1. Name of Medication: conjugated estrogens (PREMARIN) vaginal cream  2. How are you currently taking this medication (dosage and times per day)? n/a  3. Are you having a reaction (difficulty breathing--STAT)? n/a  4. What is your medication issue? Insurance does not cover medication, $500

## 2020-08-09 ENCOUNTER — Other Ambulatory Visit: Payer: Self-pay | Admitting: Internal Medicine

## 2020-08-09 DIAGNOSIS — E118 Type 2 diabetes mellitus with unspecified complications: Secondary | ICD-10-CM

## 2020-08-09 DIAGNOSIS — E1165 Type 2 diabetes mellitus with hyperglycemia: Secondary | ICD-10-CM

## 2020-08-09 DIAGNOSIS — N951 Menopausal and female climacteric states: Secondary | ICD-10-CM

## 2020-08-09 MED ORDER — HYLAFEM PH VA SUPP: 1.0000 | Freq: Every day | VAGINAL | 1 refills | Status: DC | PRN

## 2020-08-11 ENCOUNTER — Telehealth: Payer: Self-pay | Admitting: Internal Medicine

## 2020-08-11 NOTE — Telephone Encounter (Signed)
° ° °  Patient calling to report she passed out this morning, now she feels dizzy. Advised patient to call EMS now.

## 2020-08-15 NOTE — Progress Notes (Signed)
We also need to make sure to tell her to always have her blood pressure checked in the left arm only.

## 2020-08-17 ENCOUNTER — Other Ambulatory Visit: Payer: Self-pay | Admitting: Physician Assistant

## 2020-08-17 DIAGNOSIS — I6523 Occlusion and stenosis of bilateral carotid arteries: Secondary | ICD-10-CM

## 2020-08-22 ENCOUNTER — Other Ambulatory Visit: Payer: Self-pay

## 2020-08-22 ENCOUNTER — Encounter: Payer: Self-pay | Admitting: Internal Medicine

## 2020-08-22 ENCOUNTER — Telehealth: Payer: Self-pay | Admitting: Physician Assistant

## 2020-08-22 ENCOUNTER — Ambulatory Visit: Payer: 59 | Admitting: Internal Medicine

## 2020-08-22 VITALS — BP 122/80 | HR 57 | Temp 98.1°F | Resp 16 | Ht 62.0 in | Wt 149.0 lb

## 2020-08-22 DIAGNOSIS — Z23 Encounter for immunization: Secondary | ICD-10-CM

## 2020-08-22 DIAGNOSIS — I10 Essential (primary) hypertension: Secondary | ICD-10-CM | POA: Diagnosis not present

## 2020-08-22 DIAGNOSIS — E876 Hypokalemia: Secondary | ICD-10-CM | POA: Diagnosis not present

## 2020-08-22 DIAGNOSIS — G909 Disorder of the autonomic nervous system, unspecified: Secondary | ICD-10-CM | POA: Diagnosis not present

## 2020-08-22 NOTE — Progress Notes (Signed)
Subjective:  Patient ID: Ashley [name], female    DOB: September 27, 1966  Age: 54 y.o. MRN: [id number]  CC: Hypertension  This visit occurred during the SARS-CoV-2 public health emergency.  Safety protocols were in place, including screening questions prior to the visit, additional usage of staff PPE, and extensive cleaning of exam room while observing appropriate contact time as indicated for disinfecting solutions.    HPI Ashley [name] presents for f/up - She continues to complain of fainting spells.  Her last episode was 4 days ago when she suddenly felt lightheaded, she sunk to the ground and was unconscious for a few minutes.  She continues to complain of headaches, tingling in her extremities, forgetfulness, and lightheadedness.  Her husband saw her recent episode and he did not see any seizure activity.  Since I last saw her she has seen a PA in cardiology and there was some concern that she may have autonomic dysfunction.  She was referred to electrophysiologist but has not seen him yet.  Outpatient Medications Prior to Visit  Medication Sig Dispense Refill   albuterol (VENTOLIN HFA) 108 (90 Base) MCG/ACT inhaler INHALE 2 PUFFS INTO THE LUNGS EVERY 6 HOURS AS NEEDED FOR WHEEZING OR SHORTNESS OF BREATH (Patient taking differently: Inhale 2 puffs into the lungs every 6 (six) hours as needed for wheezing or shortness of breath. ) 6.7 g 3   AQUALANCE LANCETS 30G MISC   98   aspirin EC 81 MG tablet Take 1 tablet (81 mg total) by mouth daily. (Patient taking differently: Take 81 mg by mouth at bedtime. ) 90 tablet 1   BAYER CONTOUR TEST test strip Testing for up to tid 100 each 98   Botulinum Toxin Type A (BOTOX) 200 units SOLR INJECT 155 UNITS IN THE MUSCLE INTO HEAD AND NECK MUSCLES EVERY 3 MONTHS BY PROVIDER. DISCARD REMAINDER. 1 each 0   cholecalciferol (VITAMIN D3) 25 MCG (1000 UT) tablet Take 2,000 Units by mouth daily.     clonazePAM (KLONOPIN) 1 MG tablet TAKE 1 TABLET(1 MG) BY MOUTH TWICE  DAILY 60 tablet 2   dicyclomine (BENTYL) 10 MG capsule Take 1 capsule (10 mg total) by mouth 2 (two) times daily as needed (Cramping and abdominal pain). (Patient taking differently: Take 10 mg by mouth in the morning and at bedtime. ) 180 capsule 1   Dulaglutide (TRULICITY) 5.39 JQ/7.3AL SOPN Inject 0.5 mLs (0.75 mg total) as directed once a week. 6 mL 3   estradiol (CLIMARA - DOSED IN MG/24 HR) 0.0375 mg/24hr patch Place 1 patch (0.0375 mg total) onto the skin once a week. 12 patch 1   etodolac (LODINE) 400 MG tablet Take 1 tablet (400 mg total) by mouth 2 (two) times daily as needed for mild pain (Back Pain). 60 tablet 3   FEROSUL 325 (65 Fe) MG tablet TAKE 1 TABLET(325 MG) BY MOUTH TWICE DAILY WITH A MEAL (Patient taking differently: Take 325 mg by mouth 2 (two) times daily with a meal. ) 180 tablet 1   fludrocortisone (FLORINEF) 0.1 MG tablet TAKE 1 TABLET(0.1 MG) BY MOUTH DAILY 90 tablet 0   Fremanezumab-vfrm ([name]) 225 MG/1.5ML SOSY Inject 225 mg into the skin every 30 (thirty) days. 1.5 mL 11   Homeopathic Products (HYLAFEM PH) SUPP Place 1 Act vaginally daily as needed. 90 suppository 1   Insulin Pen Needle 32G X 6 MM MISC Use daily as directed 100 each 1   irbesartan (AVAPRO) 150 MG tablet Take 1 tablet (150  mg total) by mouth daily. 90 tablet 1   Lancet Devices (ADJUSTABLE LANCING DEVICE) MISC Up to tid testing  98   LEVEMIR 100 UNIT/ML injection INJECT 0.3 MLS(30 UNITS) INTO THE SKIN DAILY 10 mL 1   metoprolol tartrate (LOPRESSOR) 25 MG tablet Take 1 tablet (25 mg total) by mouth 2 (two) times daily. 180 tablet 3   nitroGLYCERIN (NITROSTAT) 0.4 MG SL tablet Place 1 tablet (0.4 mg total) under the tongue every 5 (five) minutes x 3 doses as needed for chest pain. 30 tablet 5   pantoprazole (PROTONIX) 40 MG tablet Take 1 tablet (40 mg total) by mouth daily. 90 tablet 1   potassium chloride SA (KLOR-CON) 20 MEQ tablet Take 1 tablet (20 mEq total) by mouth 2 (two) times daily.  180 tablet 0   promethazine (PHENERGAN) 25 MG tablet TAKE 1 TABLET(25 MG) BY MOUTH EVERY 6 HOURS AS NEEDED FOR NAUSEA OR VOMITING (Patient taking differently: Take 25 mg by mouth every 6 (six) hours as needed for nausea or vomiting. ) 30 tablet 1   Rimegepant Sulfate (NURTEC) 75 MG TBDP Take 75 mg by mouth daily as needed. For migraines. Take as close to onset of migraine as possible. One daily maximum. (Patient taking differently: Take 75 mg by mouth daily as needed (For migraines). ) 10 tablet 6   rosuvastatin (CRESTOR) 20 MG tablet TAKE 1 TABLET BY MOUTH EVERY DAY, NEED OFFICE VISIT FOR MORE REFILLS 90 tablet 1   thiamine (VITAMIN B-1) 50 MG tablet Take 1 tablet (50 mg total) by mouth daily. 90 tablet 1   traZODone (DESYREL) 150 MG tablet TAKE 1 TABLET(150 MG) BY MOUTH AT BEDTIME 90 tablet 1   venlafaxine XR (EFFEXOR-XR) 150 MG 24 hr capsule Take 1 capsule (150 mg total) by mouth daily with breakfast. (Patient taking differently: Take 150 mg by mouth daily. ) 90 capsule 3   No facility-administered medications prior to visit.    ROS Review of Systems  Constitutional: Positive for fatigue. Negative for appetite change, diaphoresis and unexpected weight change.  HENT: Negative.  Negative for trouble swallowing.   Eyes: Negative for visual disturbance.  Respiratory: Negative for cough, chest tightness, shortness of breath and wheezing.   Cardiovascular: Negative for chest pain, palpitations and leg swelling.  Gastrointestinal: Negative for abdominal pain, constipation, diarrhea, nausea and vomiting.  Endocrine: Negative.   Genitourinary: Negative for decreased urine volume, difficulty urinating, dysuria, hematuria and urgency.  Musculoskeletal: Negative.  Negative for arthralgias and myalgias.  Skin: Negative.  Negative for rash.  Neurological: Positive for dizziness, syncope, light-headedness and headaches. Negative for seizures, weakness and numbness.  Hematological: Negative for  adenopathy. Does not bruise/bleed easily.  Psychiatric/Behavioral: Positive for confusion and decreased concentration.    Objective:  BP 122/80    Pulse (!) 57    Temp 98.1 F (36.7 C) (Oral)    Resp 16    Ht 5\' 2"  (1.575 m)    Wt 149 lb (67.6 kg)    LMP 02/12/2014    SpO2 97%    BMI 27.25 kg/m   BP Readings from Last 3 Encounters:  08/22/20 122/80  07/29/20 132/64  07/25/20 (!) 160/80    Wt Readings from Last 3 Encounters:  08/22/20 149 lb (67.6 kg)  07/29/20 149 lb 3.2 oz (67.7 kg)  07/25/20 149 lb (67.6 kg)    Physical Exam Vitals reviewed.  Constitutional:      Appearance: Normal appearance.  HENT:     Nose: Nose normal.  Mouth/Throat:     Mouth: Mucous membranes are moist.  Eyes:     General: No scleral icterus.    Conjunctiva/sclera: Conjunctivae normal.  Cardiovascular:     Rate and Rhythm: Normal rate and regular rhythm.     Heart sounds: No murmur heard.   Pulmonary:     Effort: Pulmonary effort is normal.     Breath sounds: No stridor. No wheezing, rhonchi or rales.  Abdominal:     General: Abdomen is flat.     Palpations: There is no mass.     Tenderness: There is no abdominal tenderness. There is no guarding.  Musculoskeletal:        General: Normal range of motion.     Cervical back: Neck supple.     Right lower leg: No edema.     Left lower leg: No edema.  Lymphadenopathy:     Cervical: No cervical adenopathy.  Skin:    General: Skin is warm and dry.  Neurological:     General: No focal deficit present.     Mental Status: She is alert and oriented to person, place, and time. Mental status is at baseline.  Psychiatric:        Mood and Affect: Mood normal.        Behavior: Behavior normal.     Lab Results  Component Value Date   WBC 9.3 05/09/2020   HGB 14.6 05/09/2020   HCT 42.5 05/09/2020   PLT 366.0 05/09/2020   GLUCOSE 82 08/22/2020   CHOL 102 09/01/2019   TRIG 91.0 09/01/2019   HDL 43.70 09/01/2019   LDLDIRECT 152.1 01/19/2013    LDLCALC 40 09/01/2019   ALT 16 04/27/2020   AST 20 04/27/2020   NA 142 08/22/2020   K 3.8 08/22/2020   CL 104 08/22/2020   CREATININE 0.72 08/22/2020   BUN 8 08/22/2020   CO2 28 08/22/2020   TSH 2.02 07/29/2020   INR 1.0 04/27/2020   HGBA1C 6.4 07/29/2020   MICROALBUR <0.7 05/09/2020    VAS US CAROTID  Result Date: 08/03/2020 Carotid Arterial Duplex Study Indications:       Bilateral carotid artery stenosis. Patient c/o worsening                    headaches, dizziness, blurred and double vision, left facial                    numbness, presyncope and syncope episodes x 2 years. Risk Factors:      Hypertension, hyperlipidemia, Diabetes, current smoker. Comparison Study:  NA Performing Technologist: Sharlett Iles RVT  Examination Guidelines: A complete evaluation includes B-mode imaging, spectral Doppler, color Doppler, and power Doppler as needed of all accessible portions of each vessel. Bilateral testing is considered an integral part of a complete examination. Limited examinations for reoccurring indications may be performed as noted.  Right Carotid Findings: +----------+--------+--------+--------+------------------+--------+             PSV cm/s EDV cm/s Stenosis Plaque Description Comments  +----------+--------+--------+--------+------------------+--------+  CCA Prox   106      17                                             +----------+--------+--------+--------+------------------+--------+  CCA Distal 95       27                                             +----------+--------+--------+--------+------------------+--------+  ICA Prox   78       24                heterogenous                 +----------+--------+--------+--------+------------------+--------+  ICA Mid    89       30       1-39%                                 +----------+--------+--------+--------+------------------+--------+  ICA Distal 82       34                                              +----------+--------+--------+--------+------------------+--------+  ECA        125      23                                             +----------+--------+--------+--------+------------------+--------+ +----------+--------+-------+--------+-------------------+             PSV cm/s EDV cms Describe Arm Pressure (mmHG)  +----------+--------+-------+--------+-------------------+  Subclavian 252              Stenotic 138                  +----------+--------+-------+--------+-------------------+ +---------+--------+--+--------+--+----------------------+  Vertebral PSV cm/s 41 EDV cm/s 10 Antegrade and Atypical  +---------+--------+--+--------+--+----------------------+  Left Carotid Findings: +----------+--------+--------+--------+------------------+--------+             PSV cm/s EDV cm/s Stenosis Plaque Description Comments  +----------+--------+--------+--------+------------------+--------+  CCA Prox   121      29                                             +----------+--------+--------+--------+------------------+--------+  CCA Distal 87       25                                             +----------+--------+--------+--------+------------------+--------+  ICA Prox   78       28                heterogenous                 +----------+--------+--------+--------+------------------+--------+  ICA Mid    90       29       1-39%                                 +----------+--------+--------+--------+------------------+--------+  ICA Distal 88       34                                             +----------+--------+--------+--------+------------------+--------+  ECA        109  14                                             +----------+--------+--------+--------+------------------+--------+ +----------+--------+--------+----------------+-------------------+             PSV cm/s EDV cm/s Describe         Arm Pressure (mmHG)  +----------+--------+--------+----------------+-------------------+  Subclavian 170                Multiphasic, WNL 141                  +----------+--------+--------+----------------+-------------------+ +---------+--------+--+--------+--+---------+  Vertebral PSV cm/s 49 EDV cm/s 15 Antegrade  +---------+--------+--+--------+--+---------+   Summary: Right Carotid: Velocities in the right ICA are consistent with a 1-39% stenosis. Left Carotid: Velocities in the left ICA are consistent with a 1-39% stenosis. Vertebrals:  Left vertebral artery demonstrates antegrade flow. Atypical              antegrade flow noted in the right vertebral artery. Subclavians: Right subclavian artery was stenotic. Normal flow hemodynamics were              seen in the left subclavian artery. *See table(s) above for measurements and observations.  Electronically signed by Larae Grooms MD on 08/03/2020 at 5:07:26 PM.    Final     Assessment & Plan:   Ashley [name] was seen today for hypertension.  Diagnoses and all orders for this visit:  Hypokalemia- Her potassium level is normal now. -     BASIC METABOLIC PANEL WITH GFR; Future -     Magnesium; Future -     Magnesium -     BASIC METABOLIC PANEL WITH GFR  Essential hypertension- Her blood pressure is adequately well controlled. -     BASIC METABOLIC PANEL WITH GFR; Future -     Magnesium; Future -     Magnesium -     BASIC METABOLIC PANEL WITH GFR  Autonomic dysfunction- She agrees to see cardiology and neurology as soon as possible. -     Ambulatory referral to Cardiology -     Ambulatory referral to Neurology  Flu vaccine need -     Flu Vaccine QUAD 6+ mos PF IM (Fluarix Quad PF)   I am having Ashley [name] maintain her Adjustable Lancing Device, AquaLance Lancets 30G, Bayer Contour Test, Insulin Pen Needle, aspirin EC, cholecalciferol, nitroGLYCERIN, [name], dicyclomine, albuterol, venlafaxine XR, Nurtec, FeroSul, promethazine, Botox, clonazePAM, traZODone, rosuvastatin, fludrocortisone, pantoprazole, irbesartan, estradiol, etodolac, Trulicity, metoprolol  tartrate, potassium chloride SA, thiamine, Levemir, and Hylafem pH.  No orders of the defined types were placed in this encounter.    Follow-up: Return in about 3 months (around 11/22/2020).  Scarlette Calico, MD

## 2020-08-22 NOTE — Telephone Encounter (Signed)
Left detailed message for patient--ok per DPR--to stop taking irbesartan per Dr. Sallyanne Kuster and Rosaria Ferries, PA-C.  Barrett, Evelene Croon, PA-C  Therisa Doyne Per Dr C, please ask her to stop the irbesartan.  Thanks    Asked pt to call back if she has any questions. Will wait for pt to call back to remove med from med list.

## 2020-08-22 NOTE — Patient Instructions (Signed)

## 2020-08-23 ENCOUNTER — Encounter: Payer: Self-pay | Admitting: Internal Medicine

## 2020-08-23 LAB — MAGNESIUM: Magnesium: 2.1 mg/dL (ref 1.5–2.5)

## 2020-08-23 LAB — BASIC METABOLIC PANEL WITH GFR
BUN: 8 mg/dL (ref 7–25)
CO2: 28 mmol/L (ref 20–32)
Calcium: 9.1 mg/dL (ref 8.6–10.4)
Chloride: 104 mmol/L (ref 98–110)
Creat: 0.72 mg/dL (ref 0.50–1.05)
GFR, Est African American: 110 mL/min/{1.73_m2} (ref >=60)
GFR, Est Non African American: 95 mL/min/{1.73_m2} (ref >=60)
Glucose, Bld: 82 mg/dL (ref 65–99)
Potassium: 3.8 mmol/L (ref 3.5–5.3)
Sodium: 142 mmol/L (ref 135–146)

## 2020-08-24 NOTE — Progress Notes (Signed)
HPI: Follow-up chest pain. Cardiac catheterization August 2017 showed no coronary disease and normal LV function. Echocardiogram February 2020 showed normal LV function.  Brain MRI May 2021 normal. Monitor June 2021 showed sinus rhythm with rare PVCs and short runs of SVT (less than 20 beats). Symptoms of palpitations and chest pain associated with sinus rhythm. Carotid Dopplers August 2021 showed 1 to 39% bilateral stenosis. Right subclavian stenosis noted. Since last seen, patient describes dyspnea on exertion but no orthopnea, PND or pedal edema.  She continues to have occasional chest pain.  It is substernal and radiates to the back.  Can occur both with exertion and at rest.  Relieved typically in minutes.  She also has had syncopal episodes previously.  She states that she will typically feel pain in her neck followed by a warm feeling, diaphoresis and mild nausea.  She then passes out.  She is unconscious for approximately 10 minutes.  This can occur both in the sitting and standing position.  Current Outpatient Medications  Medication Sig Dispense Refill   albuterol (VENTOLIN HFA) 108 (90 Base) MCG/ACT inhaler INHALE 2 PUFFS INTO THE LUNGS EVERY 6 HOURS AS NEEDED FOR WHEEZING OR SHORTNESS OF BREATH (Patient taking differently: Inhale 2 puffs into the lungs every 6 (six) hours as needed for wheezing or shortness of breath. ) 6.7 g 3   AQUALANCE LANCETS 30G MISC   98   aspirin EC 81 MG tablet Take 1 tablet (81 mg total) by mouth daily. (Patient taking differently: Take 81 mg by mouth at bedtime. ) 90 tablet 1   BAYER CONTOUR TEST test strip Testing for up to tid 100 each 98   Botulinum Toxin Type A (BOTOX) 200 units SOLR INJECT 155 UNITS IN THE MUSCLE INTO HEAD AND NECK MUSCLES EVERY 3 MONTHS BY PROVIDER. DISCARD REMAINDER. 1 each 0   cholecalciferol (VITAMIN D3) 25 MCG (1000 UT) tablet Take 2,000 Units by mouth daily.     clonazePAM (KLONOPIN) 1 MG tablet TAKE 1 TABLET(1 MG) BY  MOUTH TWICE DAILY 60 tablet 2   dicyclomine (BENTYL) 10 MG capsule Take 1 capsule (10 mg total) by mouth 2 (two) times daily as needed (Cramping and abdominal pain). (Patient taking differently: Take 10 mg by mouth in the morning and at bedtime. ) 180 capsule 1   Dulaglutide (TRULICITY) 3.01 SW/1.0XN SOPN Inject 0.5 mLs (0.75 mg total) as directed once a week. 6 mL 3   estradiol (CLIMARA - DOSED IN MG/24 HR) 0.0375 mg/24hr patch Place 1 patch (0.0375 mg total) onto the skin once a week. 12 patch 1   etodolac (LODINE) 400 MG tablet Take 1 tablet (400 mg total) by mouth 2 (two) times daily as needed for mild pain (Back Pain). 60 tablet 3   FEROSUL 325 (65 Fe) MG tablet TAKE 1 TABLET(325 MG) BY MOUTH TWICE DAILY WITH A MEAL (Patient taking differently: Take 325 mg by mouth 2 (two) times daily with a meal. ) 180 tablet 1   fludrocortisone (FLORINEF) 0.1 MG tablet TAKE 1 TABLET(0.1 MG) BY MOUTH DAILY 90 tablet 0   Fremanezumab-vfrm ([name]) 225 MG/1.5ML SOSY Inject 225 mg into the skin every 30 (thirty) days. 1.5 mL 11   Homeopathic Products (HYLAFEM PH) SUPP Place 1 Act vaginally daily as needed. 90 suppository 1   Insulin Pen Needle 32G X 6 MM MISC Use daily as directed 100 each 1   irbesartan (AVAPRO) 150 MG tablet Take 1 tablet (150 mg total) by  mouth daily. 90 tablet 1   Lancet Devices (ADJUSTABLE LANCING DEVICE) MISC Up to tid testing  98   LEVEMIR 100 UNIT/ML injection INJECT 0.3 MLS(30 UNITS) INTO THE SKIN DAILY 10 mL 1   metoprolol tartrate (LOPRESSOR) 25 MG tablet Take 1 tablet (25 mg total) by mouth 2 (two) times daily. 180 tablet 3   nitroGLYCERIN (NITROSTAT) 0.4 MG SL tablet Place 1 tablet (0.4 mg total) under the tongue every 5 (five) minutes x 3 doses as needed for chest pain. 30 tablet 5   pantoprazole (PROTONIX) 40 MG tablet Take 1 tablet (40 mg total) by mouth daily. 90 tablet 1   potassium chloride SA (KLOR-CON) 20 MEQ tablet Take 1 tablet (20 mEq total) by mouth 2 (two)  times daily. 180 tablet 0   promethazine (PHENERGAN) 25 MG tablet TAKE 1 TABLET(25 MG) BY MOUTH EVERY 6 HOURS AS NEEDED FOR NAUSEA OR VOMITING (Patient taking differently: Take 25 mg by mouth every 6 (six) hours as needed for nausea or vomiting. ) 30 tablet 1   Rimegepant Sulfate (NURTEC) 75 MG TBDP Take 75 mg by mouth daily as needed. For migraines. Take as close to onset of migraine as possible. One daily maximum. (Patient taking differently: Take 75 mg by mouth daily as needed (For migraines). ) 10 tablet 6   rosuvastatin (CRESTOR) 20 MG tablet TAKE 1 TABLET BY MOUTH EVERY DAY, NEED OFFICE VISIT FOR MORE REFILLS 90 tablet 1   thiamine (VITAMIN B-1) 50 MG tablet Take 1 tablet (50 mg total) by mouth daily. 90 tablet 1   traZODone (DESYREL) 150 MG tablet TAKE 1 TABLET(150 MG) BY MOUTH AT BEDTIME 90 tablet 1   venlafaxine XR (EFFEXOR-XR) 150 MG 24 hr capsule Take 1 capsule (150 mg total) by mouth daily with breakfast. (Patient taking differently: Take 150 mg by mouth daily. ) 90 capsule 3   No current facility-administered medications for this visit.     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 Deborra Medina, 07/2014   GERD (gastroesophageal reflux disease)    H/O exercise stress test 2011   done at [facility]- told that it was WNL, done due to pt. having panic attacks    History of blood transfusion Feb 23, 1966   at birth in [place], [place], unsure number of units   PONV (postoperative nausea and vomiting)    Poor dentition    very poor  oral health    SVD (spontaneous vaginal delivery)    x 3   Tobacco abuse    Wears glasses     Past Surgical History:  Procedure Laterality Date   ABDOMINAL HYSTERECTOMY     APPENDECTOMY  1988   BREAST LUMPECTOMY WITH RADIOACTIVE SEED LOCALIZATION Left 03/17/2020   Procedure: LEFT BREAST LUMPECTOMY WITH RADIOACTIVE SEED LOCALIZATION;  Surgeon: Coralie Keens, MD;  Location: [facility];  Service: General;  Laterality: Left;  LMA   CARDIAC CATHETERIZATION N/A 08/17/2016   Procedure: Left Heart Cath and Coronary  Angiography;  Surgeon: Burnell Blanks, MD;  Location: [facility];  Service: Cardiovascular;  Laterality: N/A;   CHOLECYSTECTOMY N/A 06/07/2014   Procedure: LAPAROSCOPIC CHOLECYSTECTOMY;  Surgeon: Ralene Ok, MD;  Location: [facility];  Service: General;  Laterality: N/A;   CHOLECYSTECTOMY  June 07 2014   COLONOSCOPY  02/16/2014   normal    ESOPHAGOGASTRODUODENOSCOPY  12/28/2013   KNEE ARTHROSCOPY  1995   left   LAPAROSCOPIC ASSISTED VAGINAL HYSTERECTOMY N/A 04/15/2014   Procedure: LAPAROSCOPIC ASSISTED VAGINAL HYSTERECTOMY;  Surgeon: Marylynn Pearson, MD;  Location: [facility];  Service: Gynecology;  Laterality: N/A;   LAPAROSCOPIC BILATERAL SALPINGO OOPHERECTOMY Bilateral 04/15/2014   Procedure: LAPAROSCOPIC BILATERAL SALPINGO OOPHORECTOMY;  Surgeon: Marylynn Pearson, MD;  Location: [facility];  Service: Gynecology;  Laterality: Bilateral;   TUBAL LIGATION      Social History   Socioeconomic History   Marital status: Married    Spouse name: Alvester Chou   Number of children: 6   Years of education: 12   Highest education level: Not on file  Occupational History   Occupation:      Comment: helps run a friends business  Tobacco Use   Smoking status: Current Every Day Smoker    Packs/day: 1.50    Years: 35.00    Pack years: 52.50    Types: Cigarettes   Smokeless tobacco: Never Used  Scientific laboratory technician Use: Never used  Substance and Sexual Activity     Alcohol use: No    Alcohol/week: 0.0 standard drinks   Drug use: No   Sexual activity: Yes    Partners: Male    Birth control/protection: Surgical  Other Topics Concern   Not on file  Social History Narrative   5 living children, one child is a crack cocaine addict who often breaks into their house to steal money. He is currently in jail. One child died of cerebral palsy.   Caffeine use: Dr Malachi Bonds (3 per day)   2 cups coffee per day   Online student, currently not working   Social Determinants of Radio broadcast assistant Strain:    Difficulty of Paying Living Expenses: Not on file  Food Insecurity:    Worried About Charity fundraiser in the Last Year: Not on file   [employer] of Food in the Last Year: Not on file  Transportation Needs:    Film/video editor (Medical): Not on file   Lack of Transportation (Non-Medical): Not on file  Physical Activity:    Days of Exercise per Week: Not on file   Minutes of Exercise per Session: Not on file  Stress:    Feeling of Stress : Not on file  Social Connections:    Frequency of Communication with Friends and Family: Not on file   Frequency of Social Gatherings with Friends and Family: Not on file   Attends Religious Services: Not on file   Active Member of Clubs or Organizations: Not on file   Attends Archivist Meetings: Not on file   Marital Status: Not on file  Intimate Partner Violence:    Fear of Current or Ex-Partner: Not on file   Emotionally Abused: Not on file   Physically Abused: Not on file   Sexually Abused: Not on file    Family History  Problem Relation Age of Onset   Diabetes Mother    Hyperlipidemia Mother    Hypertension Mother    Anxiety disorder Mother    Depression  Mother    Drug abuse Mother    Stroke Mother        x3   Prostate cancer Father    Alcohol abuse Father    Lung cancer Maternal Grandfather        smoked   Emphysema Maternal Grandfather         smoked   COPD Maternal Grandmother        never smoked    Dementia Maternal Grandmother    Thyroid cancer Paternal Aunt    Colon cancer Neg Hx    Rectal cancer Neg Hx    Stomach cancer Neg Hx    Migraines Neg Hx     ROS: no fevers or chills, productive cough, hemoptysis, dysphasia, odynophagia, melena, hematochezia, dysuria, hematuria, rash, seizure activity, orthopnea, PND, pedal edema, claudication. Remaining systems are negative.  Physical Exam: Well-developed well-nourished in no acute distress.  Skin is warm and dry.  HEENT is normal.  Neck is supple.  Chest is clear to auscultation with normal expansion.  Cardiovascular exam is regular rate and rhythm.  Abdominal exam nontender or distended. No masses palpated. Extremities show no edema. neuro grossly intact  ECG-sinus bradycardia-no ST changes.  Personally reviewed  A/P  1 chest pain-symptoms are atypical.  Previous catheterization normal.  Plan cardiac CTA to further assess.  2 palpitations-previous monitor as outlined in HPI.  Some of her symptoms were associated with sinus rhythm.  Continue beta-blocker.  3 syncope-LV function is normal.  Monitor unremarkable.  Some of the description sounds likely to be neurally mediated.  We discussed the importance of increasing hydration and increasing sodium intake.  We will consider further evaluation if symptoms persist despite these measures.  4 hyperlipidemia-continue statin.  5 hypertension-blood pressure controlled.  Continue present medications.  6 tobacco abuse-patient counseled on discontinuing.  Kirk Ruths, MD

## 2020-08-26 ENCOUNTER — Encounter: Payer: Self-pay | Admitting: Cardiology

## 2020-08-26 ENCOUNTER — Ambulatory Visit (INDEPENDENT_AMBULATORY_CARE_PROVIDER_SITE_OTHER): Payer: 59 | Admitting: Cardiology

## 2020-08-26 ENCOUNTER — Other Ambulatory Visit: Payer: Self-pay

## 2020-08-26 VITALS — BP 118/78 | HR 55 | Ht 62.0 in | Wt 149.6 lb

## 2020-08-26 DIAGNOSIS — R55 Syncope and collapse: Secondary | ICD-10-CM | POA: Diagnosis not present

## 2020-08-26 DIAGNOSIS — R072 Precordial pain: Secondary | ICD-10-CM

## 2020-08-26 DIAGNOSIS — R0789 Other chest pain: Secondary | ICD-10-CM

## 2020-08-26 DIAGNOSIS — I10 Essential (primary) hypertension: Secondary | ICD-10-CM

## 2020-08-26 NOTE — Patient Instructions (Signed)
Medication Instructions:  NO CHANGE *If you need a refill on your cardiac medications before your next appointment, please call your pharmacy*  Testing/Procedures:  Your cardiac CT will be scheduled at one of the below locations:   [facility] [address] [place], [place] [address] [phone]  If scheduled at [facility], please arrive at the [facility] main entrance of [facility] 30 minutes prior to test start time. Proceed to the [facility] Radiology Department (first floor) to check-in and test prep.  Please follow these instructions carefully (unless otherwise directed):  On the Night Before the Test:  Be sure to Drink plenty of water.  Do not consume any caffeinated/decaffeinated beverages or chocolate 12 hours prior to your test.  Do not take any antihistamines 12 hours prior to your test.  On the Day of the Test:  Drink plenty of water. Do not drink any water within one hour of the test.  Do not eat any food 4 hours prior to the test.  You may take your regular medications prior to the test.   Take metoprolol (Lopressor) 100 MG two hours prior to test.  HOLD Furosemide/Hydrochlorothiazide morning of the test.  FEMALES- please wear underwire-free bra if available       After the Test:  Drink plenty of water.  After receiving IV contrast, you may experience a mild flushed feeling. This is normal.  On occasion, you may experience a mild rash up to 24 hours after the test. This is not dangerous. If this occurs, you can take Benadryl 25 mg and increase your fluid intake.  If you experience trouble breathing, this can be serious. If it is severe call 911 IMMEDIATELY. If it is mild, please call our office.  If you take any of these medications: Glipizide/Metformin, Avandament, Glucavance, please do not take 48 hours after completing test unless otherwise instructed.   Once we have confirmed authorization from your insurance  company, we will call you to set up a date and time for your test. Based on how quickly your insurance processes prior authorizations requests, please allow up to 4 weeks to be contacted for scheduling your Cardiac CT appointment. Be advised that routine Cardiac CT appointments could be scheduled as many as 8 weeks after your provider has ordered it.  For non-scheduling related questions, please contact the cardiac imaging nurse navigator should you have any questions/concerns: Marchia Bond, Cardiac Imaging Nurse Navigator Burley Saver, Interim Cardiac Imaging Nurse [place] [facility] Direct Office Dial: [phone]   For scheduling needs, including cancellations and rescheduling, please call Vivien Rota at [phone], option 3.      Follow-Up: At [facility], you and your health needs are our priority.  As part of our continuing mission to provide you with exceptional heart care, we have created designated Provider Care Teams.  These Care Teams include your primary Cardiologist (physician) and Advanced Practice Providers (APPs -  Physician Assistants and Nurse Practitioners) who all work together to provide you with the care you need, when you need it.  We recommend signing up for the patient portal called "MyChart".  Sign up information is provided on this After Visit Summary.  MyChart is used to connect with patients for Virtual Visits (Telemedicine).  Patients are able to view lab/test results, encounter notes, upcoming appointments, etc.  Non-urgent messages can be sent to your provider as well.   To learn more about what you can do with MyChart, go  to [email or web address].    Your next appointment:    Your physician recommends that you schedule a follow-up appointment in: [place] physician wants you to follow-up in: [place] will receive a reminder letter in the mail two months in advance. If you don't receive a letter,  please call our office to schedule the follow-up appointment.

## 2020-09-01 ENCOUNTER — Other Ambulatory Visit: Payer: Self-pay | Admitting: Neurology

## 2020-09-01 DIAGNOSIS — G43711 Chronic migraine without aura, intractable, with status migrainosus: Secondary | ICD-10-CM

## 2020-09-05 ENCOUNTER — Other Ambulatory Visit: Payer: Self-pay | Admitting: Internal Medicine

## 2020-09-05 DIAGNOSIS — F411 Generalized anxiety disorder: Secondary | ICD-10-CM

## 2020-09-05 NOTE — Telephone Encounter (Signed)
Done erx 

## 2020-09-05 NOTE — Telephone Encounter (Signed)
Sent to Dr. John. 

## 2020-09-12 ENCOUNTER — Other Ambulatory Visit: Payer: Self-pay | Admitting: Internal Medicine

## 2020-09-13 ENCOUNTER — Other Ambulatory Visit: Payer: Self-pay | Admitting: Internal Medicine

## 2020-09-13 DIAGNOSIS — E118 Type 2 diabetes mellitus with unspecified complications: Secondary | ICD-10-CM

## 2020-09-13 DIAGNOSIS — E1165 Type 2 diabetes mellitus with hyperglycemia: Secondary | ICD-10-CM

## 2020-09-26 ENCOUNTER — Telehealth: Payer: Self-pay | Admitting: Family Medicine

## 2020-09-26 NOTE — Telephone Encounter (Signed)
Patient has a Botox appointment on 10/26. Her [facility] Botox PA expired on 9/7. I filled out new PA request and faxed to [facility]. Patient uses [facility].

## 2020-09-27 ENCOUNTER — Other Ambulatory Visit: Payer: Self-pay | Admitting: Internal Medicine

## 2020-09-27 DIAGNOSIS — E611 Iron deficiency: Secondary | ICD-10-CM

## 2020-09-28 DIAGNOSIS — Z8249 Family history of ischemic heart disease and other diseases of the circulatory system: Secondary | ICD-10-CM | POA: Insufficient documentation

## 2020-10-10 NOTE — Telephone Encounter (Signed)
I called [facility] and spoke with Jamal to see if Botox delivery could be scheduled. He states [facility] has been calling the patient to get consent but has not been able to reach her. I placed Jamal on hold and called the patient. I was not able to speak with her and her VM is full. I will attempt to contact her again tomorrow.

## 2020-10-10 NOTE — Telephone Encounter (Signed)
I called [facility] to check the status of the request. I spoke with [facility] who states [phone] and [phone] have been approved, PA #[id number] (10/18/20- 01/16/21).

## 2020-10-11 NOTE — Telephone Encounter (Signed)
I called the patient again today. I was not able to speak with her or LVM.

## 2020-10-12 ENCOUNTER — Other Ambulatory Visit: Payer: Self-pay | Admitting: Internal Medicine

## 2020-10-12 DIAGNOSIS — I959 Hypotension, unspecified: Secondary | ICD-10-CM

## 2020-10-12 NOTE — Telephone Encounter (Signed)
I attempted to contact the patient again today, still not able to get in touch with patient. I tried calling her husband and the number did not seem to be valid. Per Angie, patient has a balance and she needs to pay before she can have her injection. She also still has not given consent to the specialty pharmacy for shipment of Botox.

## 2020-10-13 NOTE — Telephone Encounter (Addendum)
I called [facility] today to see if Botox delivery could be scheduled. I spoke with Sonia Baller, who states that they still have not been able to reach patient. Because we have not been able to schedule Botox delivery to our office and patient has an outstanding balance, patient's Botox appointment has been cancelled. FYI

## 2020-10-18 ENCOUNTER — Ambulatory Visit: Payer: 59 | Admitting: Family Medicine

## 2020-10-26 ENCOUNTER — Other Ambulatory Visit: Payer: Self-pay | Admitting: Internal Medicine

## 2020-10-29 ENCOUNTER — Other Ambulatory Visit: Payer: Self-pay | Admitting: Internal Medicine

## 2020-10-29 DIAGNOSIS — E876 Hypokalemia: Secondary | ICD-10-CM

## 2020-10-31 LAB — BASIC METABOLIC PANEL
BUN/Creatinine Ratio: 12 (ref 9–23)
BUN: 9 mg/dL (ref 6–24)
CO2: 24 mmol/L (ref 20–29)
Calcium: 9.4 mg/dL (ref 8.7–10.2)
Chloride: 103 mmol/L (ref 96–106)
Creatinine, Ser: 0.75 mg/dL (ref 0.57–1.00)
GFR calc Af Amer: 105 mL/min/{1.73_m2} (ref >59)
GFR calc non Af Amer: 91 mL/min/{1.73_m2} (ref >59)
Glucose: 119 mg/dL — ABNORMAL HIGH (ref 65–99)
Potassium: 4.4 mmol/L (ref 3.5–5.2)
Sodium: 142 mmol/L (ref 134–144)

## 2020-10-31 LAB — TSH: TSH: 1.67 u[IU]/mL (ref 0.450–4.500)

## 2020-11-04 ENCOUNTER — Telehealth (HOSPITAL_COMMUNITY): Payer: Self-pay | Admitting: *Deleted

## 2020-11-04 NOTE — Telephone Encounter (Signed)
Reaching out to patient to offer assistance regarding upcoming cardiac imaging study; pt verbalizes understanding of appt date/time, parking situation and where to check in, pre-test NPO status and medications ordered, and verified current allergies; name and call back number provided for further questions should they arise  [name] [phone] office [phone] cell  Pt verbalized understanding she will need a driver if she is going to be taking her anti-anxiety for the test.

## 2020-11-07 ENCOUNTER — Encounter: Payer: Self-pay | Admitting: *Deleted

## 2020-11-07 ENCOUNTER — Encounter (HOSPITAL_COMMUNITY): Payer: Self-pay

## 2020-11-07 ENCOUNTER — Other Ambulatory Visit: Payer: Self-pay

## 2020-11-07 ENCOUNTER — Ambulatory Visit (HOSPITAL_COMMUNITY)
Admission: RE | Admit: 2020-11-07 | Discharge: 2020-11-07 | Disposition: A | Discharge status: Home / Self Care | Payer: 59 | Source: Ambulatory Visit | Attending: Cardiology | Admitting: Cardiology

## 2020-11-07 DIAGNOSIS — Z006 Encounter for examination for normal comparison and control in clinical research program: Secondary | ICD-10-CM

## 2020-11-07 DIAGNOSIS — R072 Precordial pain: Secondary | ICD-10-CM | POA: Insufficient documentation

## 2020-11-07 MED ORDER — IOHEXOL 350 MG/ML SOLN
80.0000 mL | Freq: Once | INTRAVENOUS | Status: AC | PRN
  Administered 2020-11-07: 80 mL via INTRAVENOUS

## 2020-11-07 MED ORDER — NITROGLYCERIN 0.4 MG SL SUBL: 0.8000 mg | SUBLINGUAL_TABLET | Freq: Once | SUBLINGUAL | Status: AC

## 2020-11-07 MED ORDER — NITROGLYCERIN 0.4 MG SL SUBL
SUBLINGUAL_TABLET | SUBLINGUAL | Status: AC
  Administered 2020-11-07: 0.8 mg via SUBLINGUAL
  Filled 2020-11-07: qty 2

## 2020-11-07 NOTE — Research (Signed)
IDENTIFY Informed Consent                  Subject Name:   [name]. [name]   Subject met inclusion and exclusion criteria.  The informed consent form, study requirements and expectations were reviewed with the subject and questions and concerns were addressed prior to the signing of the consent form.  The subject verbalized understanding of the trial requirements.  The subject agreed to participate in the IDENTIFY trial and signed the informed consent.  The informed consent was obtained prior to performance of any protocol-specific procedures for the subject.  A copy of the signed informed consent was given to the subject and a copy was placed in the subject's medical record.   [place] Kenzleigh Sedam, Research Assistant   11/07/2020  15:05 p.m.

## 2020-12-04 ENCOUNTER — Other Ambulatory Visit: Payer: Self-pay | Admitting: Internal Medicine

## 2020-12-04 DIAGNOSIS — F411 Generalized anxiety disorder: Secondary | ICD-10-CM

## 2020-12-05 NOTE — Telephone Encounter (Signed)
Ok forward to pcp 

## 2020-12-06 ENCOUNTER — Other Ambulatory Visit: Payer: Self-pay | Admitting: Internal Medicine

## 2020-12-06 DIAGNOSIS — E876 Hypokalemia: Secondary | ICD-10-CM

## 2020-12-06 DIAGNOSIS — I959 Hypotension, unspecified: Secondary | ICD-10-CM

## 2020-12-06 MED ORDER — POTASSIUM CHLORIDE CRYS ER 20 MEQ PO TBCR: 20.0000 meq | EXTENDED_RELEASE_TABLET | Freq: Two times a day (BID) | ORAL | 0 refills | Status: DC

## 2020-12-13 ENCOUNTER — Other Ambulatory Visit: Payer: Self-pay | Admitting: Family

## 2020-12-13 DIAGNOSIS — E118 Type 2 diabetes mellitus with unspecified complications: Secondary | ICD-10-CM

## 2020-12-13 DIAGNOSIS — E1165 Type 2 diabetes mellitus with hyperglycemia: Secondary | ICD-10-CM

## 2020-12-24 DIAGNOSIS — E785 Hyperlipidemia, unspecified: Secondary | ICD-10-CM | POA: Insufficient documentation

## 2020-12-24 DIAGNOSIS — Z79899 Other long term (current) drug therapy: Secondary | ICD-10-CM | POA: Insufficient documentation

## 2020-12-24 DIAGNOSIS — K219 Gastro-esophageal reflux disease without esophagitis: Secondary | ICD-10-CM | POA: Insufficient documentation

## 2020-12-24 DIAGNOSIS — D509 Iron deficiency anemia, unspecified: Secondary | ICD-10-CM | POA: Insufficient documentation

## 2020-12-24 DIAGNOSIS — Z794 Long term (current) use of insulin: Secondary | ICD-10-CM | POA: Insufficient documentation

## 2020-12-24 DIAGNOSIS — E119 Type 2 diabetes mellitus without complications: Secondary | ICD-10-CM | POA: Insufficient documentation

## 2021-01-02 ENCOUNTER — Other Ambulatory Visit: Payer: Self-pay | Admitting: Internal Medicine

## 2021-01-02 DIAGNOSIS — F411 Generalized anxiety disorder: Secondary | ICD-10-CM

## 2021-01-07 ENCOUNTER — Other Ambulatory Visit: Payer: Self-pay | Admitting: Internal Medicine

## 2021-01-07 DIAGNOSIS — E785 Hyperlipidemia, unspecified: Secondary | ICD-10-CM

## 2021-01-07 DIAGNOSIS — I959 Hypotension, unspecified: Secondary | ICD-10-CM

## 2021-01-07 DIAGNOSIS — I739 Peripheral vascular disease, unspecified: Secondary | ICD-10-CM

## 2021-01-07 DIAGNOSIS — E118 Type 2 diabetes mellitus with unspecified complications: Secondary | ICD-10-CM

## 2021-01-09 ENCOUNTER — Other Ambulatory Visit: Payer: Self-pay | Admitting: Neurology

## 2021-01-12 ENCOUNTER — Telehealth: Payer: Self-pay

## 2021-01-12 NOTE — Telephone Encounter (Signed)
I have submitted a PA request for [name] on CMM, Key: [id number].   Awaiting determination from [facility]

## 2021-01-13 ENCOUNTER — Other Ambulatory Visit: Payer: Self-pay | Admitting: Internal Medicine

## 2021-01-13 DIAGNOSIS — E118 Type 2 diabetes mellitus with unspecified complications: Secondary | ICD-10-CM

## 2021-01-13 DIAGNOSIS — I10 Essential (primary) hypertension: Secondary | ICD-10-CM

## 2021-01-16 NOTE — Telephone Encounter (Deleted)
PA for ajovy has been approved through [facility] on Exchange plan. Approval dates 01/12/21-03/01/2021.

## 2021-01-16 NOTE — Telephone Encounter (Signed)
Received fax from [place] needing more information on PA for Ajvoy.. Information provided and faxed back to [phone].  Confirmation received.

## 2021-01-18 ENCOUNTER — Other Ambulatory Visit: Payer: Self-pay | Admitting: Internal Medicine

## 2021-01-18 DIAGNOSIS — E1165 Type 2 diabetes mellitus with hyperglycemia: Secondary | ICD-10-CM

## 2021-01-18 DIAGNOSIS — E118 Type 2 diabetes mellitus with unspecified complications: Secondary | ICD-10-CM

## 2021-01-18 NOTE — Telephone Encounter (Signed)
Approved for quantity of 1.54mL per 30 days.   Effective from 01/17/2021-01/16/2022

## 2021-01-23 ENCOUNTER — Other Ambulatory Visit: Payer: Self-pay | Admitting: Internal Medicine

## 2021-01-23 DIAGNOSIS — K2100 Gastro-esophageal reflux disease with esophagitis, without bleeding: Secondary | ICD-10-CM

## 2021-01-27 ENCOUNTER — Other Ambulatory Visit: Payer: Self-pay | Admitting: Internal Medicine

## 2021-01-27 DIAGNOSIS — E876 Hypokalemia: Secondary | ICD-10-CM

## 2021-01-28 ENCOUNTER — Other Ambulatory Visit: Payer: Self-pay | Admitting: Internal Medicine

## 2021-01-28 DIAGNOSIS — N951 Menopausal and female climacteric states: Secondary | ICD-10-CM

## 2021-02-01 ENCOUNTER — Other Ambulatory Visit: Payer: Self-pay | Admitting: Internal Medicine

## 2021-02-10 ENCOUNTER — Ambulatory Visit (INDEPENDENT_AMBULATORY_CARE_PROVIDER_SITE_OTHER)
Admission: RE | Admit: 2021-02-10 | Discharge: 2021-02-10 | Disposition: A | Discharge status: Home / Self Care | Payer: 59 | Source: Ambulatory Visit | Attending: Family | Admitting: Family

## 2021-02-10 ENCOUNTER — Other Ambulatory Visit: Payer: Self-pay

## 2021-02-10 ENCOUNTER — Telehealth (INDEPENDENT_AMBULATORY_CARE_PROVIDER_SITE_OTHER): Payer: 59 | Admitting: Family

## 2021-02-10 DIAGNOSIS — R059 Cough, unspecified: Secondary | ICD-10-CM

## 2021-02-10 MED ORDER — DOXYCYCLINE HYCLATE 100 MG PO TABS: 100.0000 mg | ORAL_TABLET | Freq: Two times a day (BID) | ORAL | 0 refills | Status: DC

## 2021-02-10 NOTE — Progress Notes (Signed)
Ashley [name] is a 55 y.o. female with the following history as recorded in EpicCare:  Patient Active Problem List   Diagnosis Date Noted  . Autonomic dysfunction 08/22/2020  . Paroxysmal SVT (supraventricular tachycardia) ([place]) 07/25/2020  . Menopausal vaginal dryness 07/25/2020  . Heart palpitations 05/09/2020  . Hot flashes, menopausal 11/10/2019  . Bilateral hearing loss 11/10/2019  . Dietary iron deficiency 09/01/2019  . Abnormal chest x-ray with multiple lung nodules 03/04/2019  . Lumbar radiculitis 02/10/2019  . Gastroesophageal reflux disease with esophagitis 02/10/2019  . Essential hypertension 10/20/2018  . Chronic migraine without aura, with intractable migraine, so stated, with status migrainosus 07/02/2018  . Thiamine deficiency 01/05/2018  . Constipation 09/12/2017  . Hypokalemia 05/23/2017  . Moderate episode of recurrent major depressive disorder ([place]) 05/16/2017  . Diabetes mellitus with complication ([place])   . 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 Tobacco abuse 01/08/2017  . Chronic bronchitis ([place]) 10/20/2015  . Dyslipidemia, goal LDL below 70 04/12/2015  . Migraine with aura, intractable 08/12/2014  . Routine general medical examination at a health care facility 05/24/2011  . Anxiety state 09/30/2009  . INSOMNIA, CHRONIC 09/30/2009    Current Outpatient Medications  Medication Sig Dispense Refill  . doxycycline (VIBRA-TABS) 100 MG tablet Take 1 tablet (100 mg total) by mouth 2 (two) times daily. 20 tablet 0  . [name] 225 MG/1.5ML SOSY INJECT 225 MG INTO THE SKIN EVERY 30 DAYS 1.5 mL 11  . albuterol (VENTOLIN HFA) 108 (90 Base) MCG/ACT inhaler INHALE 2 PUFFS INTO THE LUNGS EVERY 6 HOURS AS NEEDED FOR WHEEZING OR SHORTNESS OF BREATH 6.7 g 3  . AQUALANCE LANCETS 30G MISC   98  . aspirin EC 81 MG tablet Take 1 tablet (81 mg total) by mouth daily. (Patient taking differently: Take 81 mg by mouth at bedtime. ) 90 tablet 1  . BAYER CONTOUR TEST test strip Testing for up  to tid 100 each 98  . Botulinum Toxin Type A (BOTOX) 200 units SOLR INJECT 155 UNITS IN THE MUSCLE INTO HEAD AND NECK MUSCLES EVERY 3 MONTHS BY PROVIDER. DISCARD REMAINDER. 1 each 0  . cholecalciferol (VITAMIN D3) 25 MCG (1000 UT) tablet Take 2,000 Units by mouth daily.    . clonazePAM (KLONOPIN) 1 MG tablet TAKE 1 TABLET(1 MG) BY MOUTH TWICE DAILY 60 tablet 2  . dicyclomine (BENTYL) 10 MG capsule Take 1 capsule (10 mg total) by mouth 2 (two) times daily as needed (Cramping and abdominal pain). (Patient not taking: Reported on 11/07/2020) 180 capsule 1  . Dulaglutide (TRULICITY) 1.61 WR/6.0AV SOPN Inject 0.5 mLs (0.75 mg total) as directed once a week. 6 mL 3  . estradiol (CLIMARA - DOSED IN MG/24 HR) 0.0375 mg/24hr patch APPLY 1 PATCH(0.038 MG) TOPICALLY TO THE SKIN 1 TIME A WEEK 12 patch 1  . etodolac (LODINE) 400 MG tablet Take 1 tablet (400 mg total) by mouth 2 (two) times daily as needed for mild pain (Back Pain). 60 tablet 3  . FEROSUL 325 (65 Fe) MG tablet TAKE 1 TABLET(325 MG) BY MOUTH TWICE DAILY WITH A MEAL 180 tablet 1  . fludrocortisone (FLORINEF) 0.1 MG tablet TAKE 1 TABLET(0.1 MG) BY MOUTH DAILY 90 tablet 0  . Homeopathic Products (HYLAFEM PH) SUPP Place 1 Act vaginally daily as needed. (Patient not taking: Reported on 11/07/2020) 90 suppository 1  . Insulin Pen Needle 32G X 6 MM MISC Use daily as directed 100 each 1  . irbesartan (AVAPRO) 150 MG tablet TAKE 1 TABLET(150 MG)  BY MOUTH DAILY 90 tablet 0  . Lancet Devices (ADJUSTABLE LANCING DEVICE) MISC Up to tid testing  98  . LEVEMIR 100 UNIT/ML injection ADMINISTER 30 UNITS UNDER THE SKIN DAILY 10 mL 0  . levETIRAcetam (KEPPRA) 500 MG tablet Take 500 mg by mouth 2 (two) times daily.    . metoprolol tartrate (LOPRESSOR) 25 MG tablet Take 1 tablet (25 mg total) by mouth 2 (two) times daily. 180 tablet 3  . nitroGLYCERIN (NITROSTAT) 0.4 MG SL tablet Place 1 tablet (0.4 mg total) under the tongue every 5 (five) minutes x 3 doses as needed  for chest pain. (Patient not taking: Reported on 11/07/2020) 30 tablet 5  . pantoprazole (PROTONIX) 40 MG tablet TAKE 1 TABLET(40 MG) BY MOUTH DAILY 90 tablet 1  . potassium chloride SA (KLOR-CON) 20 MEQ tablet TAKE 1 TABLET(20 MEQ) BY MOUTH TWICE DAILY 180 tablet 0  . promethazine (PHENERGAN) 25 MG tablet TAKE 1 TABLET(25 MG) BY MOUTH EVERY 6 HOURS AS NEEDED FOR NAUSEA OR VOMITING 30 tablet 1  . Rimegepant Sulfate (NURTEC) 75 MG TBDP Take 75 mg by mouth daily as needed. For migraines. Take as close to onset of migraine as possible. One daily maximum. (Patient taking differently: Take 75 mg by mouth daily as needed (For migraines). ) 10 tablet 6  . rosuvastatin (CRESTOR) 20 MG tablet TAKE 1 TABLET BY MOUTH EVERY DAY, NEED OFFICE VISIT FOR MORE REFILLS 90 tablet 1  . thiamine (VITAMIN B-1) 50 MG tablet Take 1 tablet (50 mg total) by mouth daily. 90 tablet 1  . traZODone (DESYREL) 150 MG tablet TAKE 1 TABLET(150 MG) BY MOUTH AT BEDTIME 90 tablet 1  . venlafaxine XR (EFFEXOR-XR) 150 MG 24 hr capsule TAKE 1 CAPSULE(150 MG) BY MOUTH DAILY WITH BREAKFAST 90 capsule 3   No current facility-administered medications for this visit.    Allergies: Penicillins, Topamax [topiramate], and Metformin and related  Past Medical History:  Diagnosis Date  . Anxiety   . Atypical ductal hyperplasia of left breast   . Bronchitis    uses inhaler if needed for bronchitis, lasted used -2014  . Cataract    bilateral  . Chronic headaches    migraines - in past, uses Phenergan for nausea   . COPD (chronic obstructive pulmonary disease) ([place])   . Deficiency anemia 12/30/2017  . Depression   . Diabetes mellitus without complication ([place])    taken off Metformin since 04/2014, HgbA1C - normal, will follow up with PCP- Dr. Deborra Medina, 07/2014  . GERD (gastroesophageal reflux disease)   . H/O exercise stress test 2011   done at [facility]- told that it was WNL, done due to pt. having panic attacks   . History of blood  transfusion 12/03/66   at birth in [place], [place], unsure number of units  . PONV (postoperative nausea and vomiting)   . Poor dentition    very poor oral health   . SVD (spontaneous vaginal delivery)    x 3  . Tobacco abuse   . Wears glasses     Past Surgical History:  Procedure Laterality Date  . ABDOMINAL HYSTERECTOMY    . APPENDECTOMY  1988  . BREAST LUMPECTOMY WITH RADIOACTIVE SEED LOCALIZATION Left 03/17/2020   Procedure: LEFT BREAST LUMPECTOMY WITH RADIOACTIVE SEED LOCALIZATION;  Surgeon: Coralie Keens, MD;  Location: [place];  Service: General;  Laterality: Left;  LMA  . CARDIAC CATHETERIZATION N/A 08/17/2016   Procedure: Left Heart Cath and Coronary Angiography;  Surgeon: Annita Brod  Angelena Form, MD;  Location: [place] [facility];  Service: Cardiovascular;  Laterality: N/A;  . CHOLECYSTECTOMY N/A 06/07/2014   Procedure: LAPAROSCOPIC CHOLECYSTECTOMY;  Surgeon: Ralene Ok, MD;  Location: [place];  Service: General;  Laterality: N/A;  . CHOLECYSTECTOMY  June 07 2014  . COLONOSCOPY  02/16/2014   normal   . ESOPHAGOGASTRODUODENOSCOPY  12/28/2013  . KNEE ARTHROSCOPY  1995   left  . LAPAROSCOPIC ASSISTED VAGINAL HYSTERECTOMY N/A 04/15/2014   Procedure: LAPAROSCOPIC ASSISTED VAGINAL HYSTERECTOMY;  Surgeon: Marylynn Pearson, MD;  Location: [facility];  Service: Gynecology;  Laterality: N/A;  . LAPAROSCOPIC BILATERAL SALPINGO OOPHERECTOMY Bilateral 04/15/2014   Procedure: LAPAROSCOPIC BILATERAL SALPINGO OOPHORECTOMY;  Surgeon: Marylynn Pearson, MD;  Location: [facility];  Service: Gynecology;  Laterality: Bilateral;  . TUBAL LIGATION      Family History  Problem Relation Age of Onset  . Diabetes Mother   . Hyperlipidemia Mother   . Hypertension Mother   . Anxiety disorder Mother   . Depression Mother   . Drug abuse Mother   . Stroke Mother        x3  . Prostate cancer Father   . Alcohol abuse Father   . Lung cancer Maternal Grandfather        smoked  . Emphysema Maternal Grandfather         smoked  . COPD Maternal Grandmother        never smoked   . Dementia Maternal Grandmother   . Thyroid cancer Paternal Aunt   . Colon cancer Neg Hx   . Rectal cancer Neg Hx   . Stomach cancer Neg Hx   . Migraines Neg Hx     Social History   Tobacco Use  . Smoking status: Current Every Day Smoker    Packs/day: 1.50    Years: 35.00    Pack years: 52.50    Types: Cigarettes  . Smokeless tobacco: Never Used  Substance Use Topics  . Alcohol use: No    Alcohol/week: 0.0 standard drinks    Subjective:   I connected with Ashley [name] on 02/10/21 at  8:40 AM EST by a video enabled telemedicine application and verified that I am speaking with the correct person using two identifiers.   I discussed the limitations of evaluation and management by telemedicine and the availability of in person appointments. The patient expressed understanding and agreed to proceed. Provider in office/ patient is at home; provider and patient are only 2 people on video call.   Patient had suspected COVID on 01/29/2021- did not actually test but notes that family members were positive and she had same symptoms; concerned because she still has persisting cough/ congestion/ sinus pressure; has noticed that her blood pressure and blood sugars are not well controlled since being sick; blood sugars averaging 200-300; blood pressure averaging 150/80; due for 6 month appointment to see her PCP for chronic care needs;   Objective:  There were no vitals filed for this visit.  General: Well developed, well nourished, in no acute distress  Head: Normocephalic and atraumatic  Lungs: Respirations unlabored;  Neurologic: Alert and oriented; speech intact; face symmetrical;   Assessment:  1. Cough     Plan:  Update CXR; start Doxycycline 100 mg bid x 10 days; increase fluids, rest and follow-up worse, no better.   She will need to see her PCP for routine follow-up in the next 2-4 weeks as well.    No follow-ups on  file.  Orders Placed This Encounter  Procedures  . DG Chest 2 View    Standing Status:   Future    Number of Occurrences:   1    Standing Expiration Date:   02/10/2022    Order Specific Question:   Reason for Exam (SYMPTOM  OR DIAGNOSIS REQUIRED)    Answer:   cough x 2 weeks; COVID + on 01/29/21    Order Specific Question:   Is patient pregnant?    Answer:   No    Order Specific Question:   Preferred imaging location?    Answer:   [facility]    Requested Prescriptions   Signed Prescriptions Disp Refills  . doxycycline (VIBRA-TABS) 100 MG tablet 20 tablet 0    Sig: Take 1 tablet (100 mg total) by mouth 2 (two) times daily.

## 2021-02-13 ENCOUNTER — Telehealth: Payer: Self-pay | Admitting: Neurology

## 2021-02-13 NOTE — Telephone Encounter (Signed)
PA was automatically created by [facility] and not triggered by pharmacy fill attempt. PA not currently needed. I called CMM and spoke with a representative.

## 2021-02-13 NOTE — Telephone Encounter (Signed)
Rashida from CoverMyMeds called to resolve an error for PA for Rimegepant Sulfate (NURTEC) 75 MG TBDP.  Ref. Key: [id number]  Phone # [phone]

## 2021-02-16 ENCOUNTER — Other Ambulatory Visit: Payer: Self-pay | Admitting: Internal Medicine

## 2021-02-16 DIAGNOSIS — E118 Type 2 diabetes mellitus with unspecified complications: Secondary | ICD-10-CM

## 2021-02-16 DIAGNOSIS — E1165 Type 2 diabetes mellitus with hyperglycemia: Secondary | ICD-10-CM

## 2021-02-22 ENCOUNTER — Other Ambulatory Visit: Payer: Self-pay

## 2021-02-22 ENCOUNTER — Encounter: Payer: Self-pay | Admitting: Physician Assistant

## 2021-02-22 ENCOUNTER — Ambulatory Visit (INDEPENDENT_AMBULATORY_CARE_PROVIDER_SITE_OTHER): Payer: 59 | Admitting: Physician Assistant

## 2021-02-22 VITALS — BP 160/82 | HR 66 | Ht 62.0 in | Wt 168.0 lb

## 2021-02-22 DIAGNOSIS — R0789 Other chest pain: Secondary | ICD-10-CM | POA: Diagnosis not present

## 2021-02-22 DIAGNOSIS — I10 Essential (primary) hypertension: Secondary | ICD-10-CM | POA: Diagnosis not present

## 2021-02-22 MED ORDER — METOPROLOL TARTRATE 50 MG PO TABS: 50.0000 mg | ORAL_TABLET | Freq: Two times a day (BID) | ORAL | 3 refills | Status: DC

## 2021-02-22 NOTE — Progress Notes (Unsigned)
Cardiology Office Note:    Date:  02/24/2021   ID:  [name], DOB 05/27/1966, MRN [id number]  PCP:  Janith Lima, MD   [place]  Cardiologist:  Sanda [place], MD  Advanced Practice Provider:  No care team member to display Electrophysiologist:  None   Referring MD: Janith Lima, MD   Chief Complaint  Patient presents with  . Follow-up    Seen for Dr. Sallyanne Kuster    History of Present Illness:    Ashley [name] is a 55 y.o. female with a hx of anxiety, COPD, syncope, depression, DM 2 managed by diet, and history of tobacco abuse.  Patient underwent cardiac catheterization in August 2017 that showed no coronary artery disease, normal LV function.  Echocardiogram obtained in February 2020 showed normal EF.  Brain MRI in May 2021 was normal.  Heart monitor in June 2021 showed sinus rhythm, rare PVCs and a short run of SVT less than 20 beats.  When she does have palpitation and the chest pain, underlying rhythm was sinus rhythm.  Carotid Doppler in August 2021 showed 1 to 39% bilateral stenosis, right subclavian stenosis was noted.  She was last seen by Dr. Stanford Breed in September 2021, at which time she continued to have some baseline dyspnea on exertion but no orthopnea or PND.  She still has occasional chest discomfort which can occur both at rest and with exertion.  Previous syncope was felt to be neurally mediated.  Coronary CT obtained in November 2021 showed coronary calcium score of 0, normal coronary arteries.  Patient presents today for follow-up.  She continues to have palpitation, and intermittent chest discomfort.  She is being evaluated by Dr. Laurena Slimmer of [facility] neurology service for seizure-like activity.  She continued to have dizzy spell and occasional fall.  For the past 2 weeks, her blood pressure has been quite elevated at home.  Blood pressure today was in the 160s.  I recommended increase the metoprolol to 50 mg twice a day dosing.  She will  need to contact cardiology service if her systolic blood pressures drop below 100, if the does happen, I will scale back to metoprolol to 25 mg twice a day.  Otherwise I do not recommend any further work-up from cardiology perspective.  She can follow-up with Dr. Stanford Breed he 1 year.   Past Medical History:  Diagnosis Date  . Anxiety   . Atypical ductal hyperplasia of left breast   . Bronchitis    uses inhaler if needed for bronchitis, lasted used -2014  . Cataract    bilateral  . Chronic headaches    migraines - in past, uses Phenergan for nausea   . COPD (chronic obstructive pulmonary disease) ([place])   . Deficiency anemia 12/30/2017  . Depression   . Diabetes mellitus without complication ([place])    taken off Metformin since 04/2014, HgbA1C - normal, will follow up with PCP- Dr. Deborra Medina, 07/2014  . GERD (gastroesophageal reflux disease)   . H/O exercise stress test 2011   done at [facility]- told that it was WNL, done due to pt. having panic attacks   . History of blood transfusion 1966/07/23   at birth in [place], [place], unsure number of units  . PONV (postoperative nausea and vomiting)   . Poor dentition    very poor oral health   . SVD (spontaneous vaginal delivery)    x 3  . Tobacco abuse   . Wears glasses  Past Surgical History:  Procedure Laterality Date  . ABDOMINAL HYSTERECTOMY    . APPENDECTOMY  1988  . BREAST LUMPECTOMY WITH RADIOACTIVE SEED LOCALIZATION Left 03/17/2020   Procedure: LEFT BREAST LUMPECTOMY WITH RADIOACTIVE SEED LOCALIZATION;  Surgeon: Coralie Keens, MD;  Location: [place];  Service: General;  Laterality: Left;  LMA  . CARDIAC CATHETERIZATION N/A 08/17/2016   Procedure: Left Heart Cath and Coronary Angiography;  Surgeon: Burnell Blanks, MD;  Location: [place] [facility];  Service: Cardiovascular;  Laterality: N/A;  . CHOLECYSTECTOMY N/A 06/07/2014   Procedure: LAPAROSCOPIC CHOLECYSTECTOMY;  Surgeon: Ralene Ok, MD;  Location: [place];  Service:  General;  Laterality: N/A;  . CHOLECYSTECTOMY  June 07 2014  . COLONOSCOPY  02/16/2014   normal   . ESOPHAGOGASTRODUODENOSCOPY  12/28/2013  . KNEE ARTHROSCOPY  1995   left  . LAPAROSCOPIC ASSISTED VAGINAL HYSTERECTOMY N/A 04/15/2014   Procedure: LAPAROSCOPIC ASSISTED VAGINAL HYSTERECTOMY;  Surgeon: Marylynn Pearson, MD;  Location: [facility];  Service: Gynecology;  Laterality: N/A;  . LAPAROSCOPIC BILATERAL SALPINGO OOPHERECTOMY Bilateral 04/15/2014   Procedure: LAPAROSCOPIC BILATERAL SALPINGO OOPHORECTOMY;  Surgeon: Marylynn Pearson, MD;  Location: [facility];  Service: Gynecology;  Laterality: Bilateral;  . TUBAL LIGATION      Current Medications: No outpatient medications have been marked as taking for the 02/22/21 encounter (Office Visit) with Almyra Deforest, PA.     Allergies:   Penicillins, Topamax [topiramate], and Metformin and related   Social History   Socioeconomic History  . Marital status: Married    Spouse name: Alvester Chou  . Number of children: 6  . Years of education: 69  . Highest education level: Not on file  Occupational History  . Occupation:      Comment: helps run a friends business  Tobacco Use  . Smoking status: Current Every Day Smoker    Packs/day: 1.50    Years: 35.00    Pack years: 52.50    Types: Cigarettes  . Smokeless tobacco: Never Used  Vaping Use  . Vaping Use: Never used  Substance and Sexual Activity  . Alcohol use: No    Alcohol/week: 0.0 standard drinks  . Drug use: No  . Sexual activity: Yes    Partners: Male    Birth control/protection: Surgical  Other Topics Concern  . Not on file  Social History Narrative   5 living children, one child is a crack cocaine addict who often breaks into their house to steal money. He is currently in jail. One child died of cerebral palsy.   Caffeine use: Dr Malachi Bonds (3 per day)   2 cups coffee per day   Online student, currently not working   Social Determinants of Radio broadcast assistant Strain: Not on file   Food Insecurity: Not on file  Transportation Needs: Not on file  Physical Activity: Not on file  Stress: Not on file  Social Connections: Not on file     Family History: The patient's family history includes Alcohol abuse in her father; Anxiety disorder in her mother; COPD in her maternal grandmother; Dementia in her maternal grandmother; Depression in her mother; Diabetes in her mother; Drug abuse in her mother; Emphysema in her maternal grandfather; Hyperlipidemia in her mother; Hypertension in her mother; Lung cancer in her maternal grandfather; Prostate cancer in her father; Stroke in her mother; Thyroid cancer in her paternal aunt. There is no history of Colon cancer, Rectal cancer, Stomach cancer, or Migraines.  ROS:   Please see the history of  present illness.     All other systems reviewed and are negative.  EKGs/Labs/Other Studies Reviewed:    The following studies were reviewed today:  Coronary CT 11/07/2020 IMPRESSION: 1. Coronary calcium score of 0. This was 0 percentile for age and sex matched control.  2. Normal coronary origin with right dominance.  3. No evidence of CAD; CAD-RADS 0.  EKG:  EKG is ordered today.  The ekg ordered today demonstrates normal sinus rhythm without significant ST-T wave changes.  Recent Labs: 04/27/2020: ALT 16 05/09/2020: Hemoglobin 14.6; Platelets 366.0 08/22/2020: Magnesium 2.1 10/31/2020: BUN 9; Creatinine, Ser 0.75; Potassium 4.4; Sodium 142; TSH 1.670  Recent Lipid Panel    Component Value Date/Time   CHOL 102 09/01/2019 1523   TRIG 91.0 09/01/2019 1523   HDL 43.70 09/01/2019 1523   CHOLHDL 2 09/01/2019 1523   VLDL 18.2 09/01/2019 1523   LDLCALC 40 09/01/2019 1523   LDLDIRECT 152.1 01/19/2013 0905     Risk Assessment/Calculations:       Physical Exam:    VS:  BP (!) 160/82   Pulse 66   Ht 5\' 2"  (1.575 m)   Wt 168 lb (76.2 kg)   LMP 02/12/2014   BMI 30.73 kg/m     Wt Readings from Last 3 Encounters:  02/22/21  168 lb (76.2 kg)  08/26/20 149 lb 9.6 oz (67.9 kg)  08/22/20 149 lb (67.6 kg)     GEN:  Well nourished, well developed in no acute distress HEENT: Normal NECK: No JVD; No carotid bruits LYMPHATICS: No lymphadenopathy CARDIAC: RRR, no murmurs, rubs, gallops RESPIRATORY:  Clear to auscultation without rales, wheezing or rhonchi  ABDOMEN: Soft, non-tender, non-distended MUSCULOSKELETAL:  No edema; No deformity  SKIN: Warm and dry NEUROLOGIC:  Alert and oriented x 3 PSYCHIATRIC:  Normal affect   ASSESSMENT:    1. Atypical chest pain   2. Essential hypertension    PLAN:    In order of problems listed above:  1. Atypical chest pain: Recent coronary CT shows 0 calcium score, normal coronary arteries.  No further work-up  2. Hypertension: Blood pressure has been elevated in the past 2 weeks.  I recommend increase metoprolol to 50 mg twice a day.  I recommended she regularly check her home blood pressure, if systolic blood pressure drop below 100 mmHg, we will scale him back to 25 mg twice daily dosing of metoprolol.        Medication Adjustments/Labs and Tests Ordered: Current medicines are reviewed at length with the patient today.  Concerns regarding medicines are outlined above.  Orders Placed This Encounter  Procedures  . EKG 12-Lead   Meds ordered this encounter  Medications  . metoprolol tartrate (LOPRESSOR) 50 MG tablet    Sig: Take 1 tablet (50 mg total) by mouth 2 (two) times daily.    Dispense:  180 tablet    Refill:  3    Patient Instructions  Medication Instructions:  INCREASE- Metoprolol Tartrate 50 mg by mouth twice a day  *If you need a refill on your cardiac medications before your next appointment, please call your pharmacy*   Lab Work: None Ordered   Testing/Procedures: None Ordered   Follow-Up: At [facility], you and your health needs are our priority.  As part of our continuing mission to provide you with exceptional heart care, we have  created designated Provider Care Teams.  These Care Teams include your primary Cardiologist (physician) and Advanced Practice Providers (APPs -  Physician Assistants and  Nurse Practitioners) who all work together to provide you with the care you need, when you need it.  We recommend signing up for the patient portal called "MyChart".  Sign up information is provided on this After Visit Summary.  MyChart is used to connect with patients for Virtual Visits (Telemedicine).  Patients are able to view lab/test results, encounter notes, upcoming appointments, etc.  Non-urgent messages can be sent to your provider as well.   To learn more about what you can do with MyChart, go to [email or web address].    Your next appointment:   1 year(s)  The format for your next appointment:   In Person  Provider:   You may see Kirk Ruths, MD or one of the following Advanced Practice Providers on your designated Care Team:    Sande Rives, [place]  Coletta Memos, FNP    Other Instructions Call office at [phone] if systolic blood pressure get below 100     Hilbert Corrigan, [place]  02/24/2021 11:20 PM    [place]

## 2021-02-22 NOTE — Patient Instructions (Signed)
Medication Instructions:  INCREASE- Metoprolol Tartrate 50 mg by mouth twice a day  *If you need a refill on your cardiac medications before your next appointment, please call your pharmacy*   Lab Work: None Ordered   Testing/Procedures: None Ordered   Follow-Up: At [facility], you and your health needs are our priority.  As part of our continuing mission to provide you with exceptional heart care, we have created designated Provider Care Teams.  These Care Teams include your primary Cardiologist (physician) and Advanced Practice Providers (APPs -  Physician Assistants and Nurse Practitioners) who all work together to provide you with the care you need, when you need it.  We recommend signing up for the patient portal called "MyChart".  Sign up information is provided on this After Visit Summary.  MyChart is used to connect with patients for Virtual Visits (Telemedicine).  Patients are able to view lab/test results, encounter notes, upcoming appointments, etc.  Non-urgent messages can be sent to your provider as well.   To learn more about what you can do with MyChart, go to [email or web address].    Your next appointment:   1 year(s)  The format for your next appointment:   In Person  Provider:   You may see Kirk Ruths, MD or one of the following Advanced Practice Providers on your designated Care Team:    Sande Rives, PA-C  Coletta Memos, FNP    Other Instructions Call office at [phone] if systolic blood pressure get below 100

## 2021-02-24 ENCOUNTER — Encounter: Payer: Self-pay | Admitting: Physician Assistant

## 2021-02-27 ENCOUNTER — Ambulatory Visit: Payer: 59 | Admitting: Internal Medicine

## 2021-03-04 ENCOUNTER — Encounter (HOSPITAL_COMMUNITY): Payer: Self-pay

## 2021-03-06 ENCOUNTER — Other Ambulatory Visit: Payer: Self-pay | Admitting: Internal Medicine

## 2021-03-06 DIAGNOSIS — F411 Generalized anxiety disorder: Secondary | ICD-10-CM

## 2021-03-15 ENCOUNTER — Ambulatory Visit (INDEPENDENT_AMBULATORY_CARE_PROVIDER_SITE_OTHER): Payer: 59 | Admitting: Internal Medicine

## 2021-03-15 ENCOUNTER — Encounter: Payer: Self-pay | Admitting: Internal Medicine

## 2021-03-15 ENCOUNTER — Other Ambulatory Visit: Payer: Self-pay

## 2021-03-15 VITALS — BP 120/82 | HR 63 | Temp 98.3°F | Ht 62.0 in | Wt 166.0 lb

## 2021-03-15 DIAGNOSIS — E785 Hyperlipidemia, unspecified: Secondary | ICD-10-CM

## 2021-03-15 DIAGNOSIS — E118 Type 2 diabetes mellitus with unspecified complications: Secondary | ICD-10-CM

## 2021-03-15 DIAGNOSIS — Z0000 Encounter for general adult medical examination without abnormal findings: Secondary | ICD-10-CM

## 2021-03-15 DIAGNOSIS — E611 Iron deficiency: Secondary | ICD-10-CM | POA: Diagnosis not present

## 2021-03-15 DIAGNOSIS — E1165 Type 2 diabetes mellitus with hyperglycemia: Secondary | ICD-10-CM

## 2021-03-15 DIAGNOSIS — Z23 Encounter for immunization: Secondary | ICD-10-CM | POA: Diagnosis not present

## 2021-03-15 DIAGNOSIS — K5904 Chronic idiopathic constipation: Secondary | ICD-10-CM

## 2021-03-15 DIAGNOSIS — I10 Essential (primary) hypertension: Secondary | ICD-10-CM

## 2021-03-15 DIAGNOSIS — E519 Thiamine deficiency, unspecified: Secondary | ICD-10-CM

## 2021-03-15 DIAGNOSIS — Z1231 Encounter for screening mammogram for malignant neoplasm of breast: Secondary | ICD-10-CM

## 2021-03-15 DIAGNOSIS — G909 Disorder of the autonomic nervous system, unspecified: Secondary | ICD-10-CM

## 2021-03-15 LAB — FERRITIN: Ferritin: 182.5 ng/mL (ref 10.0–291.0)

## 2021-03-15 LAB — CBC WITH DIFFERENTIAL/PLATELET
Basophils Absolute: 0.1 10*3/uL (ref 0.0–0.1)
Basophils Relative: 0.7 % (ref 0.0–3.0)
Eosinophils Absolute: 0.3 10*3/uL (ref 0.0–0.7)
Eosinophils Relative: 3.5 % (ref 0.0–5.0)
HCT: 43.7 % (ref 36.0–46.0)
Hemoglobin: 15 g/dL (ref 12.0–15.0)
Lymphocytes Relative: 23.7 % (ref 12.0–46.0)
Lymphs Abs: 2 10*3/uL (ref 0.7–4.0)
MCHC: 34.4 g/dL (ref 30.0–36.0)
MCV: 94 fl (ref 78.0–100.0)
Monocytes Absolute: 0.6 10*3/uL (ref 0.1–1.0)
Monocytes Relative: 6.6 % (ref 3.0–12.0)
Neutro Abs: 5.5 10*3/uL (ref 1.4–7.7)
Neutrophils Relative %: 65.5 % (ref 43.0–77.0)
Platelets: 322 10*3/uL (ref 150.0–400.0)
RBC: 4.65 Mil/uL (ref 3.87–5.11)
RDW: 13.8 % (ref 11.5–15.5)
WBC: 8.4 10*3/uL (ref 4.0–10.5)

## 2021-03-15 LAB — LIPID PANEL
Cholesterol: 121 mg/dL (ref 0–200)
HDL: 40.8 mg/dL (ref >39.00)
LDL Cholesterol: 57 mg/dL (ref 0–99)
NonHDL: 80.42
Total CHOL/HDL Ratio: 3
Triglycerides: 118 mg/dL (ref 0.0–149.0)
VLDL: 23.6 mg/dL (ref 0.0–40.0)

## 2021-03-15 LAB — IRON: Iron: 91 ug/dL (ref 42–145)

## 2021-03-15 LAB — BASIC METABOLIC PANEL
BUN: 12 mg/dL (ref 6–23)
CO2: 29 mEq/L (ref 19–32)
Calcium: 9.3 mg/dL (ref 8.4–10.5)
Chloride: 104 mEq/L (ref 96–112)
Creatinine, Ser: 0.81 mg/dL (ref 0.40–1.20)
GFR: 81.87 mL/min (ref >60.00)
Glucose, Bld: 131 mg/dL — ABNORMAL HIGH (ref 70–99)
Potassium: 3.7 mEq/L (ref 3.5–5.1)
Sodium: 143 mEq/L (ref 135–145)

## 2021-03-15 LAB — HEMOGLOBIN A1C: Hgb A1c MFr Bld: 6.8 % — ABNORMAL HIGH (ref 4.6–6.5)

## 2021-03-15 MED ORDER — INSULIN DETEMIR 100 UNIT/ML ~~LOC~~ SOLN: 30.0000 [IU] | Freq: Every day | SUBCUTANEOUS | 1 refills | Status: DC

## 2021-03-15 MED ORDER — FREESTYLE LIBRE 2 READER DEVI: 1.0000 | Freq: Every day | 5 refills | Status: DC

## 2021-03-15 MED ORDER — TRULICITY 1.5 MG/0.5ML ~~LOC~~ SOAJ: 1.5000 mg | SUBCUTANEOUS | 1 refills | Status: DC

## 2021-03-15 MED ORDER — FREESTYLE LIBRE 2 SENSOR MISC: 1.0000 | Freq: Every day | 5 refills | Status: DC

## 2021-03-15 NOTE — Progress Notes (Signed)
Subjective:  Patient ID: Ashley [name], female    DOB: Aug 05, 1966  Age: 55 y.o. MRN: [id number]  CC: Diabetes, Hyperlipidemia, Hypertension, and Annual Exam  This visit occurred during the SARS-CoV-2 public health emergency.  Safety protocols were in place, including screening questions prior to the visit, additional usage of staff PPE, and extensive cleaning of exam room while observing appropriate contact time as indicated for disinfecting solutions.    HPI Ashley [name] presents for a CPX.  She continues to have a positive review of systems.  She complains of seizures, syncope, frequent falls, palpitations, fatigue, dizziness, lightheadedness, and weakness.  She is followed closely by neurology and cardiology.  She complains of a poor appetite.  Outpatient Medications Prior to Visit  Medication Sig Dispense Refill  . [name] 225 MG/1.5ML SOSY INJECT 225 MG INTO THE SKIN EVERY 30 DAYS 1.5 mL 11  . albuterol (VENTOLIN HFA) 108 (90 Base) MCG/ACT inhaler INHALE 2 PUFFS INTO THE LUNGS EVERY 6 HOURS AS NEEDED FOR WHEEZING OR SHORTNESS OF BREATH 6.7 g 3  . AQUALANCE LANCETS 30G MISC   98  . aspirin EC 81 MG tablet Take 1 tablet (81 mg total) by mouth daily. (Patient taking differently: Take 81 mg by mouth at bedtime.) 90 tablet 1  . BAYER CONTOUR TEST test strip Testing for up to tid 100 each 98  . cholecalciferol (VITAMIN D3) 25 MCG (1000 UT) tablet Take 2,000 Units by mouth daily.    . clonazePAM (KLONOPIN) 1 MG tablet TAKE 1 TABLET(1 MG) BY MOUTH TWICE DAILY 60 tablet 0  . estradiol (CLIMARA - DOSED IN MG/24 HR) 0.0375 mg/24hr patch APPLY 1 PATCH(0.038 MG) TOPICALLY TO THE SKIN 1 TIME A WEEK 12 patch 1  . FEROSUL 325 (65 Fe) MG tablet TAKE 1 TABLET(325 MG) BY MOUTH TWICE DAILY WITH A MEAL 180 tablet 1  . fludrocortisone (FLORINEF) 0.1 MG tablet TAKE 1 TABLET(0.1 MG) BY MOUTH DAILY 90 tablet 0  . Insulin Pen Needle 32G X 6 MM MISC Use daily as directed 100 each 1  . irbesartan (AVAPRO) 150 MG  tablet TAKE 1 TABLET(150 MG) BY MOUTH DAILY 90 tablet 0  . Lancet Devices (ADJUSTABLE LANCING DEVICE) MISC Up to tid testing  98  . levETIRAcetam (KEPPRA) 500 MG tablet Take 500 mg by mouth 2 (two) times daily.    . metoprolol tartrate (LOPRESSOR) 50 MG tablet Take 1 tablet (50 mg total) by mouth 2 (two) times daily. 180 tablet 3  . pantoprazole (PROTONIX) 40 MG tablet TAKE 1 TABLET(40 MG) BY MOUTH DAILY 90 tablet 1  . potassium chloride SA (KLOR-CON) 20 MEQ tablet TAKE 1 TABLET(20 MEQ) BY MOUTH TWICE DAILY 180 tablet 0  . Rimegepant Sulfate (NURTEC) 75 MG TBDP Take 75 mg by mouth daily as needed. For migraines. Take as close to onset of migraine as possible. One daily maximum. (Patient taking differently: Take 75 mg by mouth daily as needed (For migraines).) 10 tablet 6  . rosuvastatin (CRESTOR) 20 MG tablet TAKE 1 TABLET BY MOUTH EVERY DAY, NEED OFFICE VISIT FOR MORE REFILLS 90 tablet 1  . traZODone (DESYREL) 150 MG tablet TAKE 1 TABLET(150 MG) BY MOUTH AT BEDTIME 90 tablet 1  . venlafaxine XR (EFFEXOR-XR) 150 MG 24 hr capsule TAKE 1 CAPSULE(150 MG) BY MOUTH DAILY WITH BREAKFAST 90 capsule 3  . Dulaglutide (TRULICITY) 3.29 JJ/8.8CZ SOPN Inject 0.5 mLs (0.75 mg total) as directed once a week. 6 mL 3  . etodolac (LODINE) 400 MG  tablet Take 1 tablet (400 mg total) by mouth 2 (two) times daily as needed for mild pain (Back Pain). 60 tablet 3  . LEVEMIR 100 UNIT/ML injection ADMINISTER 30 UNITS UNDER THE SKIN DAILY 10 mL 0  . nitroGLYCERIN (NITROSTAT) 0.4 MG SL tablet Place 1 tablet (0.4 mg total) under the tongue every 5 (five) minutes x 3 doses as needed for chest pain. 30 tablet 5  . promethazine (PHENERGAN) 25 MG tablet TAKE 1 TABLET(25 MG) BY MOUTH EVERY 6 HOURS AS NEEDED FOR NAUSEA OR VOMITING 30 tablet 1  . thiamine (VITAMIN B-1) 50 MG tablet Take 1 tablet (50 mg total) by mouth daily. 90 tablet 1   No facility-administered medications prior to visit.    ROS Review of Systems   Constitutional: Positive for appetite change and fatigue. Negative for chills, diaphoresis, fever and unexpected weight change.  HENT: Negative.  Negative for trouble swallowing.   Eyes: Negative.   Respiratory: Negative for cough, chest tightness, shortness of breath and wheezing.   Cardiovascular: Positive for palpitations. Negative for chest pain and leg swelling.  Gastrointestinal: Positive for constipation. Negative for abdominal pain, blood in stool, diarrhea, nausea and vomiting.  Endocrine: Negative.   Genitourinary: Negative.  Negative for difficulty urinating.  Musculoskeletal: Negative.  Negative for arthralgias and myalgias.  Skin: Negative.  Negative for color change and pallor.  Neurological: Positive for dizziness, seizures, syncope, weakness and light-headedness. Negative for tremors, numbness and headaches.  Hematological: Negative for adenopathy. Does not bruise/bleed easily.  Psychiatric/Behavioral: Positive for dysphoric mood and sleep disturbance. Negative for behavioral problems, confusion, hallucinations, self-injury and suicidal ideas. The patient is nervous/anxious. The patient is not hyperactive.     Objective:  BP 120/82   Pulse 63   Temp 98.3 F (36.8 C) (Oral)   Ht 5\' 2"  (1.575 m)   Wt 166 lb (75.3 kg)   LMP 02/12/2014   SpO2 96%   BMI 30.36 kg/m   BP Readings from Last 3 Encounters:  03/15/21 120/82  02/22/21 (!) 160/82  11/07/20 127/62    Wt Readings from Last 3 Encounters:  03/15/21 166 lb (75.3 kg)  02/22/21 168 lb (76.2 kg)  08/26/20 149 lb 9.6 oz (67.9 kg)    Physical Exam Vitals reviewed.  Constitutional:      Appearance: Normal appearance.  HENT:     Nose: Nose normal.     Mouth/Throat:     Mouth: Mucous membranes are moist.  Eyes:     General: No scleral icterus.    Conjunctiva/sclera: Conjunctivae normal.  Cardiovascular:     Rate and Rhythm: Normal rate and regular rhythm.     Heart sounds: No murmur heard.   Pulmonary:      Effort: Pulmonary effort is normal.     Breath sounds: No stridor. No wheezing, rhonchi or rales.  Abdominal:     General: Abdomen is flat. Bowel sounds are normal. There is no distension.     Palpations: Abdomen is soft. There is no hepatomegaly, splenomegaly or mass.     Tenderness: There is no abdominal tenderness.  Musculoskeletal:        General: Normal range of motion.     Cervical back: Neck supple.     Right lower leg: No edema.     Left lower leg: No edema.  Lymphadenopathy:     Cervical: No cervical adenopathy.  Skin:    General: Skin is warm and dry.     Coloration: Skin is not pale.  Neurological:     General: No focal deficit present.     Mental Status: She is alert.  Psychiatric:        Mood and Affect: Mood normal.        Behavior: Behavior normal.     Lab Results  Component Value Date   WBC 8.4 03/15/2021   HGB 15.0 03/15/2021   HCT 43.7 03/15/2021   PLT 322.0 03/15/2021   GLUCOSE 131 (H) 03/15/2021   CHOL 121 03/15/2021   TRIG 118.0 03/15/2021   HDL 40.80 03/15/2021   LDLDIRECT 152.1 01/19/2013   LDLCALC 57 03/15/2021   ALT 16 04/27/2020   AST 20 04/27/2020   NA 143 03/15/2021   K 3.7 03/15/2021   CL 104 03/15/2021   CREATININE 0.81 03/15/2021   BUN 12 03/15/2021   CO2 29 03/15/2021   TSH 1.670 10/31/2020   INR 1.0 04/27/2020   HGBA1C 6.8 (H) 03/15/2021   MICROALBUR <0.7 05/09/2020    DG Chest 2 View  Result Date: 02/10/2021 CLINICAL DATA:  Cough, question, shortness of breath and chest tightness for 2-3 weeks, diabetes mellitus, smoker EXAM: CHEST - 2 VIEW COMPARISON:  12/01/2019 FINDINGS: Normal heart size, mediastinal contours, and pulmonary vascularity. Minimal atelectasis along minor fissure. No acute infiltrate, pleural effusion, or pneumothorax. Osseous structures unremarkable. IMPRESSION: Minimal atelectasis at minor fissure. No acute infiltrate. Electronically Signed   By: Lavonia Dana M.D.   On: 02/10/2021 19:03    Assessment &  Plan:   Corda was seen today for diabetes, hyperlipidemia, hypertension and annual exam.  Diagnoses and all orders for this visit:  Essential hypertension- Her blood pressure is adequately well controlled. -     CBC with Differential/Platelet; Future -     Basic metabolic panel; Future -     Basic metabolic panel -     CBC with Differential/Platelet  Diabetes mellitus with complication ([place])- Her blood sugar is adequately well controlled. -     Basic metabolic panel; Future -     Hemoglobin A1c; Future -     Ambulatory referral to Ophthalmology -     Hemoglobin A1c -     Basic metabolic panel -     Dulaglutide (TRULICITY) 1.5 SW/5.4OE SOPN; Inject 1.5 mg into the skin once a week. -     insulin detemir (LEVEMIR) 100 UNIT/ML injection; Inject 0.3 mLs (30 Units total) into the skin at bedtime. -     Continuous Blood Gluc Sensor (FREESTYLE LIBRE 2 SENSOR) MISC; 1 Act by Does not apply route daily. -     Continuous Blood Gluc Receiver (FREESTYLE LIBRE 2 READER) DEVI; 1 Act by Does not apply route daily.  Dietary iron deficiency- Her H&H are normal now. -     CBC with Differential/Platelet; Future -     Iron; Future -     Ferritin; Future -     Ferritin -     Iron -     CBC with Differential/Platelet  Dyslipidemia, goal LDL below 70- She has achieved her LDL goal and is doing well on the statin.  Thiamine deficiency- Will continue the thiamine supplement. -     CBC with Differential/Platelet; Future -     Vitamin B1; Future -     Vitamin B1 -     CBC with Differential/Platelet -     thiamine (VITAMIN B-1) 50 MG tablet; Take 1 tablet (50 mg total) by mouth daily.  Routine general medical examination at a health care facility-  Exam completed, labs reviewed, vaccines reviewed and updated, cancer screenings addressed, patient education material was given. -     Lipid panel; Future -     Lipid panel  Autonomic dysfunction -     Referral to Neuro Rehab  Chronic idiopathic  constipation- Cont miralax.  Need for vaccination -     Pneumococcal conjugate vaccine 20-valent (Prevnar 20)  Visit for screening mammogram -     MM DIGITAL SCREENING BILATERAL; Future  Uncontrolled type 2 diabetes mellitus with hyperglycemia (HCC) -     insulin detemir (LEVEMIR) 100 UNIT/ML injection; Inject 0.3 mLs (30 Units total) into the skin at bedtime.  Other orders -     Varicella-zoster vaccine IM (Shingrix)   I have discontinued Bralynn B. Casseus's nitroGLYCERIN, etodolac, Trulicity, and promethazine. I have also changed her Levemir to insulin detemir. Additionally, I am having her start on Trulicity, FreeStyle [employer], and [employer] 2 Reader. Lastly, I am having her maintain her Adjustable Lancing Device, AquaLance Lancets 30G, Bayer Contour Test, Insulin Pen Needle, aspirin EC, cholecalciferol, Nurtec, [name], FeroSul, levETIRAcetam, traZODone, rosuvastatin, fludrocortisone, venlafaxine XR, irbesartan, pantoprazole, potassium chloride SA, estradiol, albuterol, metoprolol tartrate, clonazePAM, and thiamine.  Meds ordered this encounter  Medications  . Dulaglutide (TRULICITY) 1.5 YS/1.6OH SOPN    Sig: Inject 1.5 mg into the skin once a week.    Dispense:  6 mL    Refill:  1  . insulin detemir (LEVEMIR) 100 UNIT/ML injection    Sig: Inject 0.3 mLs (30 Units total) into the skin at bedtime.    Dispense:  30 mL    Refill:  1  . Continuous Blood Gluc Sensor (FREESTYLE LIBRE 2 SENSOR) MISC    Sig: 1 Act by Does not apply route daily.    Dispense:  2 each    Refill:  5  . Continuous Blood Gluc Receiver (FREESTYLE LIBRE 2 READER) DEVI    Sig: 1 Act by Does not apply route daily.    Dispense:  2 each    Refill:  5  . thiamine (VITAMIN B-1) 50 MG tablet    Sig: Take 1 tablet (50 mg total) by mouth daily.    Dispense:  90 tablet    Refill:  1     Follow-up: Return in about 6 months (around 09/15/2021).  Scarlette Calico, MD

## 2021-03-15 NOTE — Patient Instructions (Signed)
Health Maintenance, Female Adopting a healthy lifestyle and getting preventive care are important in promoting health and wellness. Ask your health care provider about:  The right schedule for you to have regular tests and exams.  Things you can do on your own to prevent diseases and keep yourself healthy. What should I know about diet, weight, and exercise? Eat a healthy diet  Eat a diet that includes plenty of vegetables, fruits, low-fat dairy products, and lean protein.  Do not eat a lot of foods that are high in solid fats, added sugars, or sodium.   Maintain a healthy weight Body mass index (BMI) is used to identify weight problems. It estimates body fat based on height and weight. Your health care provider can help determine your BMI and help you achieve or maintain a healthy weight. Get regular exercise Get regular exercise. This is one of the most important things you can do for your health. Most adults should:  Exercise for at least 150 minutes each week. The exercise should increase your heart rate and make you sweat (moderate-intensity exercise).  Do strengthening exercises at least twice a week. This is in addition to the moderate-intensity exercise.  Spend less time sitting. Even light physical activity can be beneficial. Watch cholesterol and blood lipids Have your blood tested for lipids and cholesterol at 55 years of age, then have this test every 5 years. Have your cholesterol levels checked more often if:  Your lipid or cholesterol levels are high.  You are older than 55 years of age.  You are at high risk for heart disease. What should I know about cancer screening? Depending on your health history and family history, you may need to have cancer screening at various ages. This may include screening for:  Breast cancer.  Cervical cancer.  Colorectal cancer.  Skin cancer.  Lung cancer. What should I know about heart disease, diabetes, and high blood  pressure? Blood pressure and heart disease  High blood pressure causes heart disease and increases the risk of stroke. This is more likely to develop in people who have high blood pressure readings, are of African descent, or are overweight.  Have your blood pressure checked: ? Every 3-5 years if you are 18-39 years of age. ? Every year if you are 40 years old or older. Diabetes Have regular diabetes screenings. This checks your fasting blood sugar level. Have the screening done:  Once every three years after age 40 if you are at a normal weight and have a low risk for diabetes.  More often and at a younger age if you are overweight or have a high risk for diabetes. What should I know about preventing infection? Hepatitis B If you have a higher risk for hepatitis B, you should be screened for this virus. Talk with your health care provider to find out if you are at risk for hepatitis B infection. Hepatitis C Testing is recommended for:  Everyone born from 1945 through 1965.  Anyone with known risk factors for hepatitis C. Sexually transmitted infections (STIs)  Get screened for STIs, including gonorrhea and chlamydia, if: ? You are sexually active and are younger than 55 years of age. ? You are older than 55 years of age and your health care provider tells you that you are at risk for this type of infection. ? Your sexual activity has changed since you were last screened, and you are at increased risk for chlamydia or gonorrhea. Ask your health care provider   if you are at risk.  Ask your health care provider about whether you are at high risk for HIV. Your health care provider may recommend a prescription medicine to help prevent HIV infection. If you choose to take medicine to prevent HIV, you should first get tested for HIV. You should then be tested every 3 months for as long as you are taking the medicine. Pregnancy  If you are about to stop having your period (premenopausal) and  you may become pregnant, seek counseling before you get pregnant.  Take 400 to 800 micrograms (mcg) of folic acid every day if you become pregnant.  Ask for birth control (contraception) if you want to prevent pregnancy. Osteoporosis and menopause Osteoporosis is a disease in which the bones lose minerals and strength with aging. This can result in bone fractures. If you are 65 years old or older, or if you are at risk for osteoporosis and fractures, ask your health care provider if you should:  Be screened for bone loss.  Take a calcium or vitamin D supplement to lower your risk of fractures.  Be given hormone replacement therapy (HRT) to treat symptoms of menopause. Follow these instructions at home: Lifestyle  Do not use any products that contain nicotine or tobacco, such as cigarettes, e-cigarettes, and chewing tobacco. If you need help quitting, ask your health care provider.  Do not use street drugs.  Do not share needles.  Ask your health care provider for help if you need support or information about quitting drugs. Alcohol use  Do not drink alcohol if: ? Your health care provider tells you not to drink. ? You are pregnant, may be pregnant, or are planning to become pregnant.  If you drink alcohol: ? Limit how much you use to 0-1 drink a day. ? Limit intake if you are breastfeeding.  Be aware of how much alcohol is in your drink. In the [place] one drink equals one 12 oz bottle of beer (355 mL), one 5 oz glass of wine (148 mL), or one 1 oz glass of hard liquor (44 mL). General instructions  Schedule regular health, dental, and eye exams.  Stay current with your vaccines.  Tell your health care provider if: ? You often feel depressed. ? You have ever been abused or do not feel safe at home. Summary  Adopting a healthy lifestyle and getting preventive care are important in promoting health and wellness.  Follow your health care provider's instructions about healthy  diet, exercising, and getting tested or screened for diseases.  Follow your health care provider's instructions on monitoring your cholesterol and blood pressure. This information is not intended to replace advice given to you by your health care provider. Make sure you discuss any questions you have with your health care provider. Document Revised: 12/03/2018 Document Reviewed: 12/03/2018 Elsevier Patient Education  2021 [employer] Inc[employer]  

## 2021-03-16 ENCOUNTER — Encounter: Payer: Self-pay | Admitting: Internal Medicine

## 2021-03-16 ENCOUNTER — Telehealth: Payer: Self-pay | Admitting: *Deleted

## 2021-03-16 DIAGNOSIS — Z006 Encounter for examination for normal comparison and control in clinical research program: Secondary | ICD-10-CM

## 2021-03-16 NOTE — Telephone Encounter (Signed)
I called patient for 90-day Identify phone call. I left message for patient to callme back or respond to my e-mail.

## 2021-03-18 LAB — VITAMIN B1: Vitamin B1 (Thiamine): 29 nmol/L (ref 8–30)

## 2021-03-18 MED ORDER — VITAMIN B-1 50 MG PO TABS: 50.0000 mg | ORAL_TABLET | Freq: Every day | ORAL | 1 refills | Status: DC

## 2021-03-20 ENCOUNTER — Emergency Department (HOSPITAL_COMMUNITY): Payer: 59

## 2021-03-20 ENCOUNTER — Other Ambulatory Visit: Payer: Self-pay

## 2021-03-20 ENCOUNTER — Inpatient Hospital Stay (HOSPITAL_COMMUNITY): Payer: 59

## 2021-03-20 ENCOUNTER — Inpatient Hospital Stay (HOSPITAL_COMMUNITY)
Admission: EM | Admit: 2021-03-20 | Discharge: 2021-03-22 | DRG: 880 | Disposition: A | Discharge status: Home Health | Payer: 59 | Attending: Neurology | Admitting: Neurology

## 2021-03-20 DIAGNOSIS — Z818 Family history of other mental and behavioral disorders: Secondary | ICD-10-CM

## 2021-03-20 DIAGNOSIS — M4802 Spinal stenosis, cervical region: Secondary | ICD-10-CM | POA: Diagnosis present

## 2021-03-20 DIAGNOSIS — Z811 Family history of alcohol abuse and dependence: Secondary | ICD-10-CM | POA: Diagnosis not present

## 2021-03-20 DIAGNOSIS — F445 Conversion disorder with seizures or convulsions: Principal | ICD-10-CM | POA: Diagnosis present

## 2021-03-20 DIAGNOSIS — F17210 Nicotine dependence, cigarettes, uncomplicated: Secondary | ICD-10-CM | POA: Diagnosis present

## 2021-03-20 DIAGNOSIS — Z79899 Other long term (current) drug therapy: Secondary | ICD-10-CM

## 2021-03-20 DIAGNOSIS — Z8349 Family history of other endocrine, nutritional and metabolic diseases: Secondary | ICD-10-CM

## 2021-03-20 DIAGNOSIS — I6521 Occlusion and stenosis of right carotid artery: Secondary | ICD-10-CM | POA: Diagnosis present

## 2021-03-20 DIAGNOSIS — Z794 Long term (current) use of insulin: Secondary | ICD-10-CM

## 2021-03-20 DIAGNOSIS — W19XXXS Unspecified fall, sequela: Secondary | ICD-10-CM | POA: Diagnosis not present

## 2021-03-20 DIAGNOSIS — E669 Obesity, unspecified: Secondary | ICD-10-CM | POA: Diagnosis present

## 2021-03-20 DIAGNOSIS — J449 Chronic obstructive pulmonary disease, unspecified: Secondary | ICD-10-CM | POA: Diagnosis present

## 2021-03-20 DIAGNOSIS — G40909 Epilepsy, unspecified, not intractable, without status epilepticus: Secondary | ICD-10-CM | POA: Diagnosis present

## 2021-03-20 DIAGNOSIS — I10 Essential (primary) hypertension: Secondary | ICD-10-CM | POA: Diagnosis present

## 2021-03-20 DIAGNOSIS — K219 Gastro-esophageal reflux disease without esophagitis: Secondary | ICD-10-CM | POA: Diagnosis present

## 2021-03-20 DIAGNOSIS — I639 Cerebral infarction, unspecified: Secondary | ICD-10-CM | POA: Diagnosis not present

## 2021-03-20 DIAGNOSIS — Z823 Family history of stroke: Secondary | ICD-10-CM

## 2021-03-20 DIAGNOSIS — Z8249 Family history of ischemic heart disease and other diseases of the circulatory system: Secondary | ICD-10-CM | POA: Diagnosis not present

## 2021-03-20 DIAGNOSIS — Z825 Family history of asthma and other chronic lower respiratory diseases: Secondary | ICD-10-CM | POA: Diagnosis not present

## 2021-03-20 DIAGNOSIS — Z820 Family history of epilepsy and other diseases of the nervous system: Secondary | ICD-10-CM

## 2021-03-20 DIAGNOSIS — E119 Type 2 diabetes mellitus without complications: Secondary | ICD-10-CM | POA: Diagnosis present

## 2021-03-20 DIAGNOSIS — Z6831 Body mass index (BMI) 31.0-31.9, adult: Secondary | ICD-10-CM | POA: Diagnosis not present

## 2021-03-20 DIAGNOSIS — G43909 Migraine, unspecified, not intractable, without status migrainosus: Secondary | ICD-10-CM | POA: Diagnosis present

## 2021-03-20 DIAGNOSIS — Z20822 Contact with and (suspected) exposure to covid-19: Secondary | ICD-10-CM | POA: Diagnosis present

## 2021-03-20 DIAGNOSIS — W19XXXA Unspecified fall, initial encounter: Secondary | ICD-10-CM | POA: Diagnosis not present

## 2021-03-20 DIAGNOSIS — I6300 Cerebral infarction due to thrombosis of unspecified precerebral artery: Secondary | ICD-10-CM | POA: Diagnosis not present

## 2021-03-20 DIAGNOSIS — Y92012 Bathroom of single-family (private) house as the place of occurrence of the external cause: Secondary | ICD-10-CM

## 2021-03-20 DIAGNOSIS — R531 Weakness: Secondary | ICD-10-CM | POA: Diagnosis present

## 2021-03-20 DIAGNOSIS — Z880 Allergy status to penicillin: Secondary | ICD-10-CM

## 2021-03-20 DIAGNOSIS — Z833 Family history of diabetes mellitus: Secondary | ICD-10-CM

## 2021-03-20 DIAGNOSIS — Z801 Family history of malignant neoplasm of trachea, bronchus and lung: Secondary | ICD-10-CM

## 2021-03-20 DIAGNOSIS — Z8042 Family history of malignant neoplasm of prostate: Secondary | ICD-10-CM

## 2021-03-20 DIAGNOSIS — I6389 Other cerebral infarction: Secondary | ICD-10-CM | POA: Diagnosis not present

## 2021-03-20 DIAGNOSIS — Z808 Family history of malignant neoplasm of other organs or systems: Secondary | ICD-10-CM

## 2021-03-20 DIAGNOSIS — Z7982 Long term (current) use of aspirin: Secondary | ICD-10-CM

## 2021-03-20 DIAGNOSIS — E785 Hyperlipidemia, unspecified: Secondary | ICD-10-CM | POA: Diagnosis present

## 2021-03-20 DIAGNOSIS — Z813 Family history of other psychoactive substance abuse and dependence: Secondary | ICD-10-CM

## 2021-03-20 DIAGNOSIS — Z888 Allergy status to other drugs, medicaments and biological substances status: Secondary | ICD-10-CM

## 2021-03-20 LAB — RESP PANEL BY RT-PCR (FLU A&B, COVID) ARPGX2
Influenza A by PCR: NEGATIVE
Influenza B by PCR: NEGATIVE
SARS Coronavirus 2 by RT PCR: NEGATIVE

## 2021-03-20 LAB — COMPREHENSIVE METABOLIC PANEL
ALT: 24 U/L (ref 0–44)
AST: 26 U/L (ref 15–41)
Albumin: 3.4 g/dL — ABNORMAL LOW (ref 3.5–5.0)
Alkaline Phosphatase: 95 U/L (ref 38–126)
Anion gap: 9 (ref 5–15)
BUN: 7 mg/dL (ref 6–20)
CO2: 28 mmol/L (ref 22–32)
Calcium: 8.9 mg/dL (ref 8.9–10.3)
Chloride: 102 mmol/L (ref 98–111)
Creatinine, Ser: 0.81 mg/dL (ref 0.44–1.00)
GFR, Estimated: >60 mL/min (ref >60)
Glucose, Bld: 103 mg/dL — ABNORMAL HIGH (ref 70–99)
Potassium: 3.2 mmol/L — ABNORMAL LOW (ref 3.5–5.1)
Sodium: 139 mmol/L (ref 135–145)
Total Bilirubin: 0.7 mg/dL (ref 0.3–1.2)
Total Protein: 6.4 g/dL — ABNORMAL LOW (ref 6.5–8.1)

## 2021-03-20 LAB — I-STAT CHEM 8, ED
BUN: 6 mg/dL (ref 6–20)
Calcium, Ion: 1.14 mmol/L — ABNORMAL LOW (ref 1.15–1.40)
Chloride: 101 mmol/L (ref 98–111)
Creatinine, Ser: 0.8 mg/dL (ref 0.44–1.00)
Glucose, Bld: 98 mg/dL (ref 70–99)
HCT: 41 % (ref 36.0–46.0)
Hemoglobin: 13.9 g/dL (ref 12.0–15.0)
Potassium: 3.1 mmol/L — ABNORMAL LOW (ref 3.5–5.1)
Sodium: 142 mmol/L (ref 135–145)
TCO2: 32 mmol/L (ref 22–32)

## 2021-03-20 LAB — CBC
HCT: 43.6 % (ref 36.0–46.0)
Hemoglobin: 14.9 g/dL (ref 12.0–15.0)
MCH: 32.3 pg (ref 26.0–34.0)
MCHC: 34.2 g/dL (ref 30.0–36.0)
MCV: 94.6 fL (ref 80.0–100.0)
Platelets: 308 10*3/uL (ref 150–400)
RBC: 4.61 MIL/uL (ref 3.87–5.11)
RDW: 12.8 % (ref 11.5–15.5)
WBC: 8 10*3/uL (ref 4.0–10.5)
nRBC: 0 % (ref 0.0–0.2)

## 2021-03-20 LAB — RAPID URINE DRUG SCREEN, HOSP PERFORMED
Amphetamines: NOT DETECTED
Barbiturates: NOT DETECTED
Benzodiazepines: NOT DETECTED
Cocaine: NOT DETECTED
Opiates: NOT DETECTED
Tetrahydrocannabinol: NOT DETECTED

## 2021-03-20 LAB — DIFFERENTIAL
Abs Immature Granulocytes: 0.03 10*3/uL (ref 0.00–0.07)
Basophils Absolute: 0.1 10*3/uL (ref 0.0–0.1)
Basophils Relative: 1 %
Eosinophils Absolute: 0.2 10*3/uL (ref 0.0–0.5)
Eosinophils Relative: 3 %
Immature Granulocytes: 0 %
Lymphocytes Relative: 21 %
Lymphs Abs: 1.6 10*3/uL (ref 0.7–4.0)
Monocytes Absolute: 0.6 10*3/uL (ref 0.1–1.0)
Monocytes Relative: 8 %
Neutro Abs: 5.4 10*3/uL (ref 1.7–7.7)
Neutrophils Relative %: 67 %

## 2021-03-20 LAB — URINALYSIS, ROUTINE W REFLEX MICROSCOPIC
Bilirubin Urine: NEGATIVE
Glucose, UA: NEGATIVE mg/dL
Hgb urine dipstick: NEGATIVE
Ketones, ur: NEGATIVE mg/dL
Leukocytes,Ua: NEGATIVE
Nitrite: NEGATIVE
Protein, ur: NEGATIVE mg/dL
Specific Gravity, Urine: 1.016 (ref 1.005–1.030)
pH: 6 (ref 5.0–8.0)

## 2021-03-20 LAB — MRSA PCR SCREENING: MRSA by PCR: NEGATIVE

## 2021-03-20 LAB — I-STAT BETA HCG BLOOD, ED (MC, WL, AP ONLY): I-stat hCG, quantitative: <5 m[IU]/mL (ref <5)

## 2021-03-20 LAB — APTT: aPTT: 24 seconds (ref 24–36)

## 2021-03-20 LAB — PROTIME-INR
INR: 1.1 (ref 0.8–1.2)
Prothrombin Time: 13.4 seconds (ref 11.4–15.2)

## 2021-03-20 LAB — GLUCOSE, CAPILLARY: Glucose-Capillary: 116 mg/dL — ABNORMAL HIGH (ref 70–99)

## 2021-03-20 LAB — ETHANOL: Alcohol, Ethyl (B): <10 mg/dL (ref <10)

## 2021-03-20 LAB — CBG MONITORING, ED: Glucose-Capillary: 105 mg/dL — ABNORMAL HIGH (ref 70–99)

## 2021-03-20 MED ORDER — ALTEPLASE (STROKE) FULL DOSE INFUSION
0.9000 mg/kg | Freq: Once | INTRAVENOUS | Status: AC
  Administered 2021-03-20: 70.7 mg via INTRAVENOUS
  Filled 2021-03-20: qty 100

## 2021-03-20 MED ORDER — ALBUTEROL SULFATE (2.5 MG/3ML) 0.083% IN NEBU: 2.5000 mg | INHALATION_SOLUTION | Freq: Four times a day (QID) | RESPIRATORY_TRACT | Status: DC | PRN

## 2021-03-20 MED ORDER — VENLAFAXINE HCL ER 75 MG PO CP24
150.0000 mg | ORAL_CAPSULE | Freq: Every day | ORAL | Status: DC
  Administered 2021-03-20 – 2021-03-21 (×2): 150 mg via ORAL
  Filled 2021-03-20 (×2): qty 1

## 2021-03-20 MED ORDER — IOHEXOL 350 MG/ML SOLN
75.0000 mL | Freq: Once | INTRAVENOUS | Status: AC | PRN
  Administered 2021-03-20: 75 mL via INTRAVENOUS

## 2021-03-20 MED ORDER — SODIUM CHLORIDE 0.9 % IV BOLUS
500.0000 mL | Freq: Once | INTRAVENOUS | Status: AC
  Administered 2021-03-20: 500 mL via INTRAVENOUS

## 2021-03-20 MED ORDER — INSULIN ASPART 100 UNIT/ML ~~LOC~~ SOLN: 0.0000 [IU] | Freq: Every day | SUBCUTANEOUS | Status: DC

## 2021-03-20 MED ORDER — STROKE: EARLY STAGES OF RECOVERY BOOK
Freq: Once | Status: AC
  Filled 2021-03-20 (×2): qty 1

## 2021-03-20 MED ORDER — ACETAMINOPHEN 650 MG RE SUPP: 650.0000 mg | RECTAL | Status: DC | PRN

## 2021-03-20 MED ORDER — CHLORHEXIDINE GLUCONATE CLOTH 2 % EX PADS
6.0000 | MEDICATED_PAD | Freq: Every day | CUTANEOUS | Status: DC
  Administered 2021-03-21: 6 via TOPICAL

## 2021-03-20 MED ORDER — ACETAMINOPHEN 325 MG PO TABS
650.0000 mg | ORAL_TABLET | ORAL | Status: DC | PRN
  Administered 2021-03-20 – 2021-03-21 (×3): 650 mg via ORAL
  Filled 2021-03-20 (×5): qty 2

## 2021-03-20 MED ORDER — ROSUVASTATIN CALCIUM 20 MG PO TABS
20.0000 mg | ORAL_TABLET | Freq: Every day | ORAL | Status: DC
  Administered 2021-03-21 – 2021-03-22 (×2): 20 mg via ORAL
  Filled 2021-03-20 (×2): qty 1

## 2021-03-20 MED ORDER — VITAMIN D 25 MCG (1000 UNIT) PO TABS
2000.0000 [IU] | ORAL_TABLET | Freq: Every day | ORAL | Status: DC
  Administered 2021-03-21 – 2021-03-22 (×2): 2000 [IU] via ORAL
  Filled 2021-03-20 (×2): qty 2

## 2021-03-20 MED ORDER — PANTOPRAZOLE SODIUM 40 MG IV SOLR
40.0000 mg | Freq: Every day | INTRAVENOUS | Status: DC
  Administered 2021-03-20: 40 mg via INTRAVENOUS
  Filled 2021-03-20: qty 40

## 2021-03-20 MED ORDER — LEVETIRACETAM 500 MG PO TABS
500.0000 mg | ORAL_TABLET | Freq: Two times a day (BID) | ORAL | Status: DC
Start: 2021-03-20 — End: 2021-03-22
  Administered 2021-03-20 – 2021-03-22 (×4): 500 mg via ORAL
  Filled 2021-03-20 (×4): qty 1

## 2021-03-20 MED ORDER — SODIUM CHLORIDE 0.9 % IV SOLN: INTRAVENOUS | Status: DC

## 2021-03-20 MED ORDER — HYDRALAZINE HCL 20 MG/ML IJ SOLN
INTRAMUSCULAR | Status: AC
  Filled 2021-03-20: qty 1

## 2021-03-20 MED ORDER — CLONAZEPAM 0.5 MG PO TABS
1.0000 mg | ORAL_TABLET | Freq: Two times a day (BID) | ORAL | Status: DC
  Administered 2021-03-20 – 2021-03-22 (×4): 1 mg via ORAL
  Filled 2021-03-20: qty 2
  Filled 2021-03-20 (×3): qty 1

## 2021-03-20 MED ORDER — SENNOSIDES-DOCUSATE SODIUM 8.6-50 MG PO TABS: 1.0000 | ORAL_TABLET | Freq: Every evening | ORAL | Status: DC | PRN

## 2021-03-20 MED ORDER — THIAMINE HCL 100 MG PO TABS
50.0000 mg | ORAL_TABLET | Freq: Every day | ORAL | Status: DC
Start: 2021-03-20 — End: 2021-03-22
  Administered 2021-03-20 – 2021-03-22 (×3): 50 mg via ORAL
  Filled 2021-03-20 (×3): qty 1

## 2021-03-20 MED ORDER — SODIUM CHLORIDE 0.9 % IV SOLN
50.0000 mL | Freq: Once | INTRAVENOUS | Status: AC
  Administered 2021-03-20: 50 mL via INTRAVENOUS

## 2021-03-20 MED ORDER — TRAZODONE HCL 50 MG PO TABS
150.0000 mg | ORAL_TABLET | Freq: Every day | ORAL | Status: DC
  Administered 2021-03-20 – 2021-03-21 (×2): 150 mg via ORAL
  Filled 2021-03-20 (×2): qty 3
  Filled 2021-03-20: qty 1

## 2021-03-20 MED ORDER — ACETAMINOPHEN 160 MG/5ML PO SOLN: 650.0000 mg | ORAL | Status: DC | PRN

## 2021-03-20 MED ORDER — ONDANSETRON HCL 4 MG/2ML IJ SOLN
4.0000 mg | Freq: Once | INTRAMUSCULAR | Status: AC
  Administered 2021-03-20: 4 mg via INTRAVENOUS

## 2021-03-20 MED ORDER — ONDANSETRON HCL 4 MG/2ML IJ SOLN
INTRAMUSCULAR | Status: AC
  Filled 2021-03-20: qty 2

## 2021-03-20 MED ORDER — INSULIN ASPART 100 UNIT/ML ~~LOC~~ SOLN
0.0000 [IU] | Freq: Three times a day (TID) | SUBCUTANEOUS | Status: DC
  Administered 2021-03-21: 2 [IU] via SUBCUTANEOUS
  Administered 2021-03-21: 3 [IU] via SUBCUTANEOUS
  Administered 2021-03-21: 2 [IU] via SUBCUTANEOUS
  Administered 2021-03-22 (×2): 3 [IU] via SUBCUTANEOUS

## 2021-03-20 MED ORDER — SODIUM CHLORIDE 0.9 % IV SOLN
100.0000 mL/h | INTRAVENOUS | Status: DC
  Administered 2021-03-20: 100 mL/h via INTRAVENOUS

## 2021-03-20 NOTE — ED Notes (Signed)
Report called to RN receiving pt in room 4N25.

## 2021-03-20 NOTE — ED Triage Notes (Signed)
Pt from home BIB EMS for c/o dizziness and fall. Pt reports hitting the front, right side of face on sink and also reports that after falling, she felt some left sided tingling. Pt ambulatory with EMS. C-collar in place on arrival. Pt take 81mg  ASA daily.

## 2021-03-20 NOTE — Progress Notes (Signed)
PHARMACIST CODE STROKE RESPONSE  Notified to mix tPA at 1533 by Dr. Lorrin Goodell Delivered tPA to RN at 1538  tPA dose = 7.1mg  bolus over 1 minute followed by 63.6mg  for a total dose of 70.7mg  over 1 hour  Issues/delays encountered (if applicable): Needed to re-weigh pt prior to starting  Ashley [name] 03/20/21 3:46 PM

## 2021-03-20 NOTE — Progress Notes (Signed)
MD made aware on new right leg drift at two hour assessment. MD Khaliqdina at the bedside assessing.   Discussed low-intensity monitoring, but due to change in exam, MD would like patient to continue standard monitoring for any worsening.

## 2021-03-20 NOTE — ED Provider Notes (Addendum)
[facility] EMERGENCY DEPARTMENT Provider Note   CSN: [id number] Arrival date & time: 03/20/21  1450     History Chief Complaint  Patient presents with  . Fall  . Weakness    Ashley [name] is a 55 y.o. female who presents via EMS for evaluation after a fall at home.  Patient states that she was feeling normally this morning around 1 PM she was in the bathroom, not using the toilet, when she felt dizzy and fell forward and hit her head on the sink and fell to the ground.  She states to this provider that she does not think that she lost consciousness.  Patient has documented history of seizure disorder, following closely with neurology as well as multiple recent episodes of falls.  Patient states she was able to immediately get up off the ground on her own, however felt that there was weakness in her left arm and her left leg.  She was able to take her self down stairs where a family member called EMS. She states she has a right-sided headache, and feels it is difficult for her to speak in addition to the left-sided weakness and altered sensation.  Hx of seizure disorder, does not think she had a seizure today.   Level 5 caveat due to acuity of patient's condition.   I personally reviewed this patient's medical records.  She has history of seizure disorder, diabetes without complication, MDD, hypertension, GERD, SVT, dyslipidemia, anxiety.  Patient is on Keppra and Klonopin.  She is not on any anticoagulation aside from a daily baby aspirin. C-collar in place.   HPI     Past Medical History:  Diagnosis Date  . Anxiety   . Atypical ductal hyperplasia of left breast   . Bronchitis    uses inhaler if needed for bronchitis, lasted used -2014  . Cataract    bilateral  . Chronic headaches    migraines - in past, uses Phenergan for nausea   . COPD (chronic obstructive pulmonary disease) ([place])   . Deficiency anemia 12/30/2017  . Depression   . Diabetes mellitus without  complication ([place])    taken off Metformin since 04/2014, HgbA1C - normal, will follow up with PCP- Dr. Deborra Medina, 07/2014  . GERD (gastroesophageal reflux disease)   . H/O exercise stress test 2011   done at [facility]- told that it was WNL, done due to pt. having panic attacks   . History of blood transfusion 09/06/1966   at birth in [place], [place], unsure number of units  . PONV (postoperative nausea and vomiting)   . Poor dentition    very poor oral health   . SVD (spontaneous vaginal delivery)    x 3  . Tobacco abuse   . Wears glasses     Patient Active Problem List   Diagnosis Date Noted  . Autonomic dysfunction 08/22/2020  . Paroxysmal SVT (supraventricular tachycardia) ([place]) 07/25/2020  . Menopausal vaginal dryness 07/25/2020  . Hot flashes, menopausal 11/10/2019  . Bilateral hearing loss 11/10/2019  . Dietary iron deficiency 09/01/2019  . Lumbar radiculitis 02/10/2019  . Gastroesophageal reflux disease with esophagitis 02/10/2019  . Essential hypertension 10/20/2018  . Chronic migraine without aura, with intractable migraine, so stated, with status migrainosus 07/02/2018  . Thiamine deficiency 01/05/2018  . Constipation 09/12/2017  . Moderate episode of recurrent major depressive disorder ([place]) 05/16/2017  . Diabetes mellitus with complication ([place])   . 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 Tobacco abuse 01/08/2017  .  Chronic bronchitis ([place]) 10/20/2015  . Dyslipidemia, goal LDL below 70 04/12/2015  . Migraine with aura, intractable 08/12/2014  . Routine general medical examination at a health care facility 05/24/2011  . Anxiety state 09/30/2009    Past Surgical History:  Procedure Laterality Date  . ABDOMINAL HYSTERECTOMY    . APPENDECTOMY  1988  . BREAST LUMPECTOMY WITH RADIOACTIVE SEED LOCALIZATION Left 03/17/2020   Procedure: LEFT BREAST LUMPECTOMY WITH RADIOACTIVE SEED LOCALIZATION;  Surgeon: Coralie Keens, MD;  Location: [facility];  Service: General;  Laterality: Left;   LMA  . CARDIAC CATHETERIZATION N/A 08/17/2016   Procedure: Left Heart Cath and Coronary Angiography;  Surgeon: Burnell Blanks, MD;  Location: [facility];  Service: Cardiovascular;  Laterality: N/A;  . CHOLECYSTECTOMY N/A 06/07/2014   Procedure: LAPAROSCOPIC CHOLECYSTECTOMY;  Surgeon: Ralene Ok, MD;  Location: [facility];  Service: General;  Laterality: N/A;  . CHOLECYSTECTOMY  June 07 2014  . COLONOSCOPY  02/16/2014   normal   . ESOPHAGOGASTRODUODENOSCOPY  12/28/2013  . KNEE ARTHROSCOPY  1995   left  . LAPAROSCOPIC ASSISTED VAGINAL HYSTERECTOMY N/A 04/15/2014   Procedure: LAPAROSCOPIC ASSISTED VAGINAL HYSTERECTOMY;  Surgeon: Marylynn Pearson, MD;  Location: [facility];  Service: Gynecology;  Laterality: N/A;  . LAPAROSCOPIC BILATERAL SALPINGO OOPHERECTOMY Bilateral 04/15/2014   Procedure: LAPAROSCOPIC BILATERAL SALPINGO OOPHORECTOMY;  Surgeon: Marylynn Pearson, MD;  Location: [facility];  Service: Gynecology;  Laterality: Bilateral;  . TUBAL LIGATION       OB History   No obstetric history on file.     Family History  Problem Relation Age of Onset  . Diabetes Mother   . Hyperlipidemia Mother   . Hypertension Mother   . Anxiety disorder Mother   . Depression Mother   . Drug abuse Mother   . Stroke Mother        x3  . Prostate cancer Father   . Alcohol abuse Father   . Lung cancer Maternal Grandfather        smoked  . Emphysema Maternal Grandfather        smoked  . COPD Maternal Grandmother        never smoked   . Dementia Maternal Grandmother   . Thyroid cancer Paternal Aunt   . Colon cancer Neg Hx   . Rectal cancer Neg Hx   . Stomach cancer Neg Hx   . Migraines Neg Hx     Social History   Tobacco Use  . Smoking status: Current Every Day Smoker    Packs/day: 1.50    Years: 35.00    Pack years: 52.50    Types: Cigarettes  . Smokeless tobacco: Never Used  Vaping Use  . Vaping Use: Never used  Substance Use Topics  . Alcohol use: No    Alcohol/week: 0.0  standard drinks  . Drug use: No    Home Medications Prior to Admission medications   Medication Sig Start Date End Date Taking? Authorizing Provider  [name] 225 MG/1.5ML SOSY INJECT 225 MG INTO THE SKIN EVERY 30 DAYS 09/01/20   Lomax, Amy, NP  albuterol (VENTOLIN HFA) 108 (90 Base) MCG/ACT inhaler INHALE 2 PUFFS INTO THE LUNGS EVERY 6 HOURS AS NEEDED FOR WHEEZING OR SHORTNESS OF BREATH 02/01/21   Janith Lima, MD  AQUALANCE LANCETS 30G MISC  11/27/17   [provider]  aspirin EC 81 MG tablet Take 1 tablet (81 mg total) by mouth daily. Patient taking differently: Take 81 mg by mouth at bedtime. 10/20/18  Janith Lima, MD  BAYER CONTOUR TEST test strip Testing for up to tid 03/12/18   Janith Lima, MD  cholecalciferol (VITAMIN D3) 25 MCG (1000 UT) tablet Take 2,000 Units by mouth daily.    [provider]  clonazePAM (KLONOPIN) 1 MG tablet TAKE 1 TABLET(1 MG) BY MOUTH TWICE DAILY 03/07/21   Janith Lima, MD  Continuous Blood Gluc Receiver (FREESTYLE LIBRE 2 READER) DEVI 1 Act by Does not apply route daily. 03/15/21   Janith Lima, MD  Continuous Blood Gluc Sensor (FREESTYLE LIBRE 2 SENSOR) MISC 1 Act by Does not apply route daily. 03/15/21   Janith Lima, MD  Dulaglutide (TRULICITY) 1.5 KN/3.9JQ SOPN Inject 1.5 mg into the skin once a week. 03/15/21   Janith Lima, MD  estradiol (CLIMARA - DOSED IN MG/24 HR) 0.0375 mg/24hr patch APPLY 1 PATCH(0.038 MG) TOPICALLY TO THE SKIN 1 TIME A WEEK 01/28/21   Janith Lima, MD  FEROSUL 325 (65 Fe) MG tablet TAKE 1 TABLET(325 MG) BY MOUTH TWICE DAILY WITH A MEAL 09/27/20   Janith Lima, MD  fludrocortisone (FLORINEF) 0.1 MG tablet TAKE 1 TABLET(0.1 MG) BY MOUTH DAILY 01/07/21   Janith Lima, MD  insulin detemir (LEVEMIR) 100 UNIT/ML injection Inject 0.3 mLs (30 Units total) into the skin at bedtime. 03/15/21   Janith Lima, MD  Insulin Pen Needle 32G X 6 MM MISC Use daily as directed 05/12/18   Marrian Salvage, FNP   irbesartan (AVAPRO) 150 MG tablet TAKE 1 TABLET(150 MG) BY MOUTH DAILY 01/13/21   Janith Lima, MD  Lancet Devices (ADJUSTABLE LANCING DEVICE) MISC Up to tid testing 09/19/17   [provider]  levETIRAcetam (KEPPRA) 500 MG tablet Take 500 mg by mouth 2 (two) times daily.    [provider]  metoprolol tartrate (LOPRESSOR) 50 MG tablet Take 1 tablet (50 mg total) by mouth 2 (two) times daily. 02/22/21 05/23/21  Almyra Deforest, PA  pantoprazole (PROTONIX) 40 MG tablet TAKE 1 TABLET(40 MG) BY MOUTH DAILY 01/23/21   Janith Lima, MD  potassium chloride SA (KLOR-CON) 20 MEQ tablet TAKE 1 TABLET(20 MEQ) BY MOUTH TWICE DAILY 01/27/21   Janith Lima, MD  Rimegepant Sulfate (NURTEC) 75 MG TBDP Take 75 mg by mouth daily as needed. For migraines. Take as close to onset of migraine as possible. One daily maximum. Patient taking differently: Take 75 mg by mouth daily as needed (For migraines). 01/19/20   Melvenia Beam, MD  rosuvastatin (CRESTOR) 20 MG tablet TAKE 1 TABLET BY MOUTH EVERY DAY, NEED OFFICE VISIT FOR MORE REFILLS 01/07/21   Janith Lima, MD  thiamine (VITAMIN B-1) 50 MG tablet Take 1 tablet (50 mg total) by mouth daily. 03/18/21   Janith Lima, MD  traZODone (DESYREL) 150 MG tablet TAKE 1 TABLET(150 MG) BY MOUTH AT BEDTIME 01/02/21   Janith Lima, MD  venlafaxine XR (EFFEXOR-XR) 150 MG 24 hr capsule TAKE 1 CAPSULE(150 MG) BY MOUTH DAILY WITH BREAKFAST 01/09/21   Melvenia Beam, MD    Allergies    Penicillins, Topamax [topiramate], and Metformin and related  Review of Systems   Review of Systems  Physical Exam Updated Vital Signs BP (!) 164/82   Pulse (!) 58   Temp 98 F (36.7 C) (Oral)   Resp (!) 22   LMP 02/12/2014   SpO2 95%   Physical Exam Vitals and nursing note reviewed.  Constitutional:  Appearance: She is obese.     Interventions: Cervical collar in place.  HENT:     Head: Normocephalic. No raccoon eyes or Battle's sign.      Nose: Nose  normal.     Mouth/Throat:     Mouth: Mucous membranes are moist.     Dentition: Abnormal dentition. Dental caries present.     Pharynx: Oropharynx is clear. Uvula midline. No oropharyngeal exudate or posterior oropharyngeal erythema.     Tonsils: No tonsillar exudate.  Eyes:     General: Lids are normal. Visual field deficit present.        Right eye: No discharge.        Left eye: No discharge.     Extraocular Movements: Extraocular movements intact.     Conjunctiva/sclera: Conjunctivae normal.     Pupils: Pupils are equal, round, and reactive to light.     Comments: Visual deficit, unable to identify number of fingers held up by this provider.   Deficit in peripheral vision, in the left eye  Neck:     Trachea: Trachea and phonation normal.     Comments: Cervical collar in place at time of evaluation. Cardiovascular:     Rate and Rhythm: Normal rate and regular rhythm.     Pulses: Normal pulses.          Radial pulses are 2+ on the right side and 2+ on the left side.       Dorsalis pedis pulses are 2+ on the right side and 2+ on the left side.     Heart sounds: Normal heart sounds. No murmur heard.   Pulmonary:     Effort: Pulmonary effort is normal. No tachypnea or respiratory distress.     Breath sounds: Normal breath sounds and air entry. No wheezing or rales.  Chest:     Chest wall: No mass, lacerations, deformity, swelling, tenderness, crepitus or edema.  Abdominal:     General: Bowel sounds are normal. There is no distension.     Palpations: Abdomen is soft.     Tenderness: There is no abdominal tenderness. There is no right CVA tenderness, left CVA tenderness, guarding or rebound.  Musculoskeletal:        General: No deformity.     Cervical back: Neck supple. No crepitus. Muscular tenderness present. No spinous process tenderness.     Right lower leg: No edema.     Left lower leg: No edema.     Comments: Asymmetric strength, with left sided weakness.  3/5 on the  left,5/5 on the right. Asymmetric strength in plantar flexion left-sided.  Patient unable to hold left leg in the air as long as she can with the right.   Lymphadenopathy:     Cervical: No cervical adenopathy.  Skin:    General: Skin is warm and dry.  Neurological:     Mental Status: She is alert and oriented to person, place, and time.     Cranial Nerves: Dysarthria present.     Sensory: Sensory deficit present.     Motor: Weakness and pronator drift present.     Coordination: Coordination abnormal.     Comments: Altered sensation on the left face, left upper and lower extremities.  Left upper and lower extremity weakness relative to the right extremities.  Dysarthric speech Blurred vision, visual field abnormality   Psychiatric:        Mood and Affect: Mood normal.     ED Results / Procedures / Treatments  Labs (all labs ordered are listed, but only abnormal results are displayed) Labs Reviewed - No data to display  EKG EKG Interpretation  Date/Time:  Monday March 20 2021 14:52:38 EDT Ventricular Rate:  57 PR Interval:    QRS Duration: 92 QT Interval:  450 QTC Calculation: 439 R Axis:   26 Text Interpretation: Sinus rhythm Atrial premature complex Borderline prolonged PR interval Confirmed by Lennice Sites [phone]) on 03/20/2021 2:56:45 PM   Radiology No results found.  Procedures .Critical Care Performed by: Emeline Darling, PA-C Authorized by: Emeline Darling, PA-C   Critical care provider statement:    Critical care time (minutes):  45   Critical care was necessary to treat or prevent imminent or life-threatening deterioration of the following conditions: Code Stroke, received TPA    Critical care was time spent personally by me on the following activities:  Discussions with consultants, evaluation of patient's response to treatment, examination of patient, ordering and performing treatments and interventions, ordering and review of laboratory  studies, ordering and review of radiographic studies, pulse oximetry, re-evaluation of patient's condition, obtaining history from patient or surrogate and review of old charts     Medications Ordered in ED Medications  sodium chloride 0.9 % bolus 500 mL (has no administration in time range)    Followed by  0.9 %  sodium chloride infusion (has no administration in time range)    ED Course  I have reviewed the triage vital signs and the nursing notes.  Pertinent labs & imaging results that were available during my care of the patient were reviewed by me and considered in my medical decision making (see chart for details).    MDM Rules/Calculators/A&P                         55 year old female presents with concern for left-sided weakness and difficulty speaking after at home which resulted in a fall.  History of seizure disorder, unsure if she lost consciousness today.  Assessments patient includes but is not limited to CVA, Todd's paralysis/ seizure/postictal confusion, encephalopathy/ metabolic derrangement, complex migraine.   Cardiopulmonary exam unremarkable, abdominal exam is benign, neurologic exam with left-sided weakness and altered sensation, visual deficit and dysarthria.  CODE STROKE ACTIVATED, patient accompanied to CT scan by this provider and attending physician.  Neurologist present in the [facility], CT negative for acute hemorrhage. Neuro exam performed by neurologist in the CT, who felt tPA was reasonable option. Patient consented and was administered tPA in the department. Patient will be admitted to the neuro ICU.   I appreciate the collaboration of the care team in the management of this patient.   This chart was dictated using voice recognition software, Dragon. Despite the best efforts of this provider to proofread and correct errors, errors may still occur which can change documentation meaning.  Final Clinical Impression(s) / ED Diagnoses Final diagnoses:   None    Rx / DC Orders ED Discharge Orders    None       Emeline Darling, PA-C 03/20/21 1601    [name] Maenza, Gypsy Balsam, PA-C 03/20/21 1602    Kirill Chatterjee, Gypsy Balsam, PA-C 03/20/21 1603    Lennice Sites, DO 03/20/21 1846

## 2021-03-20 NOTE — Progress Notes (Signed)
Pt complained of being unable to urinate. Bladder scan completed and 526 mL noted. In and out completed per MD [facility] order. 700 cc drained and patient reported relief.

## 2021-03-20 NOTE — ED Provider Notes (Signed)
I personally evaluated the patient during the encounter and completed a history, physical, procedures, medical decision making to contribute to the overall care of the patient and decision making for the patient briefly, the patient is a 55 y.o. female dizziness and fall.  Patient with overall unremarkable vitals.  Had an episode of dizziness and fell and hit the right side of her head.  Upon evaluation by my PA she thought patient might have left-sided weakness, slurred speech and concern for stroke as her last known normal was 1:00.  My exam produce the same results and concerning for stroke symptoms with left-sided weakness, difficulty with speech, some dysmetria.  CT scan of the head showed no head bleed.  Neurology came down and evaluated her during code stroke and patient was given TPA given her symptoms.  She does have a seizure history but did not appear to have a seizure-like episode.  Seems less likely to be a Todd's paralysis.  EKG shows sinus rhythm.  Overall patient got TPA and will be admitted to the neurological ICU for further stroke work-up.  This chart was dictated using voice recognition software.  Despite best efforts to proofread,  errors can occur which can change the documentation meaning.     EKG Interpretation  Date/Time:  Monday March 20 2021 15:12:18 EDT Ventricular Rate:  55 PR Interval:    QRS Duration: 89 QT Interval:  489 QTC Calculation: 468 R Axis:   38 Text Interpretation: Sinus rhythm Atrial premature complex Confirmed by Lennice Sites [phone]) on 03/20/2021 3:31:05 PM           Lennice Sites, DO 03/20/21 1548

## 2021-03-20 NOTE — H&P (Addendum)
Neurology  Reason for Consult:  CODE STROKE Referring Physician: Lennice Sites  CC: L sided weakness  History is obtained from: Patient and EMR  HPI: Ashley [name] is a 55 y.o. female with PMH of seizure, T2DM, GERD, anxiety, migraines, HTN, HLD. Patient presented as a Code Stroke 04/27/20 with right sided weakness; however workup was negative for stroke.  Patient presented via EMS but was not a CODE STROKE until ED provider did preliminary exam.  Per EMR patient was reported feeling normal until 1PM today when patient was in the bathroom and felt dizzy. Patient fell forward and hit her head. On initial exam patient denied LOC; however during code stroke patient reports that she is unsure if she had LOC. Patient reported that she was able to get up on her own; however she noted that her left side extremities felt weaker and patient also felt that she has more difficulty talking. Patient reported a right -sided headache on initial exam in the ED, but did not report this during Code Stroke. Patient reports that this presentation is different from her seizures in the past.  LKW: 1300, 03/20/21 tpa given?: yes, No anticoagulants and no hemorrhage on CT , No MI recently, no hx of recent bleeds.(discussed ~30 chance of improvement in symptoms, 6% risk of ICH)  ROS: A 14 point ROS was performed and is negative except as noted in the HPI.   Past Medical History:  Diagnosis Date  . Anxiety   . Atypical ductal hyperplasia of left breast   . Bronchitis    uses inhaler if needed for bronchitis, lasted used -2014  . Cataract    bilateral  . Chronic headaches    migraines - in past, uses Phenergan for nausea   . COPD (chronic obstructive pulmonary disease) ([place])   . Deficiency anemia 12/30/2017  . Depression   . Diabetes mellitus without complication ([name])    taken off Metformin since 04/2014, HgbA1C - normal, will follow up with PCP- Dr. Deborra Medina, 07/2014  . GERD (gastroesophageal reflux disease)   . H/O  exercise stress test 2011   done at [facility]- told that it was WNL, done due to pt. having panic attacks   . History of blood transfusion Mar 28, 1966   at birth in [place], [place], unsure number of units  . PONV (postoperative nausea and vomiting)   . Poor dentition    very poor oral health   . SVD (spontaneous vaginal delivery)    x 3  . Tobacco abuse   . Wears glasses      Family History  Problem Relation Age of Onset  . Diabetes Mother   . Hyperlipidemia Mother   . Hypertension Mother   . Anxiety disorder Mother   . Depression Mother   . Drug abuse Mother   . Stroke Mother        x3  . Prostate cancer Father   . Alcohol abuse Father   . Lung cancer Maternal Grandfather        smoked  . Emphysema Maternal Grandfather        smoked  . COPD Maternal Grandmother        never smoked   . Dementia Maternal Grandmother   . Thyroid cancer Paternal Aunt   . Colon cancer Neg Hx   . Rectal cancer Neg Hx   . Stomach cancer Neg Hx   . Migraines Neg Hx      Social History:   reports that she has been  smoking cigarettes. She has a 52.50 pack-year smoking history. She has never used smokeless tobacco. She reports that she does not drink alcohol and does not use drugs.  Medications  Current Facility-Administered Medications:  .  [COMPLETED] sodium chloride 0.9 % bolus 500 mL, 500 mL, Intravenous, Once, Last Rate: 500 mL/hr at 03/20/21 1600, 500 mL at 03/20/21 1600 **FOLLOWED BY** 0.9 %  sodium chloride infusion, 100 mL/hr, Intravenous, Continuous, Sponseller, Rebekah R, PA-C .  alteplase (ACTIVASE) 1 mg/mL infusion 70.7 mg, 0.9 mg/kg, Intravenous, Once, Last Rate: 63.6 mL/hr at 03/20/21 1540, 70.7 mg at 03/20/21 1540 **FOLLOWED BY** 0.9 %  sodium chloride infusion, 50 mL, Intravenous, Once, Donnetta Simpers, MD .  hydrALAZINE (APRESOLINE) 20 MG/ML injection, , , ,   Current Outpatient Medications:  .  [name] 225 MG/1.5ML SOSY, INJECT 225 MG INTO THE SKIN EVERY 30 DAYS, Disp: 1.5 mL,  Rfl: 11 .  albuterol (VENTOLIN HFA) 108 (90 Base) MCG/ACT inhaler, INHALE 2 PUFFS INTO THE LUNGS EVERY 6 HOURS AS NEEDED FOR WHEEZING OR SHORTNESS OF BREATH, Disp: 6.7 g, Rfl: 3 .  AQUALANCE LANCETS 30G MISC, , Disp: , Rfl: 98 .  aspirin EC 81 MG tablet, Take 1 tablet (81 mg total) by mouth daily. (Patient taking differently: Take 81 mg by mouth at bedtime.), Disp: 90 tablet, Rfl: 1 .  BAYER CONTOUR TEST test strip, Testing for up to tid, Disp: 100 each, Rfl: 98 .  cholecalciferol (VITAMIN D3) 25 MCG (1000 UT) tablet, Take 2,000 Units by mouth daily., Disp: , Rfl:  .  clonazePAM (KLONOPIN) 1 MG tablet, TAKE 1 TABLET(1 MG) BY MOUTH TWICE DAILY, Disp: 60 tablet, Rfl: 0 .  Continuous Blood Gluc Receiver (FREESTYLE LIBRE 2 READER) DEVI, 1 Act by Does not apply route daily., Disp: 2 each, Rfl: 5 .  Continuous Blood Gluc Sensor (FREESTYLE LIBRE 2 SENSOR) MISC, 1 Act by Does not apply route daily., Disp: 2 each, Rfl: 5 .  Dulaglutide (TRULICITY) 1.5 CW/2.3JS SOPN, Inject 1.5 mg into the skin once a week., Disp: 6 mL, Rfl: 1 .  estradiol (CLIMARA - DOSED IN MG/24 HR) 0.0375 mg/24hr patch, APPLY 1 PATCH(0.038 MG) TOPICALLY TO THE SKIN 1 TIME A WEEK, Disp: 12 patch, Rfl: 1 .  FEROSUL 325 (65 Fe) MG tablet, TAKE 1 TABLET(325 MG) BY MOUTH TWICE DAILY WITH A MEAL, Disp: 180 tablet, Rfl: 1 .  fludrocortisone (FLORINEF) 0.1 MG tablet, TAKE 1 TABLET(0.1 MG) BY MOUTH DAILY, Disp: 90 tablet, Rfl: 0 .  insulin detemir (LEVEMIR) 100 UNIT/ML injection, Inject 0.3 mLs (30 Units total) into the skin at bedtime., Disp: 30 mL, Rfl: 1 .  Insulin Pen Needle 32G X 6 MM MISC, Use daily as directed, Disp: 100 each, Rfl: 1 .  irbesartan (AVAPRO) 150 MG tablet, TAKE 1 TABLET(150 MG) BY MOUTH DAILY, Disp: 90 tablet, Rfl: 0 .  Lancet Devices (ADJUSTABLE LANCING DEVICE) MISC, Up to tid testing, Disp: , Rfl: 98 .  levETIRAcetam (KEPPRA) 500 MG tablet, Take 500 mg by mouth 2 (two) times daily., Disp: , Rfl:  .  metoprolol tartrate  (LOPRESSOR) 50 MG tablet, Take 1 tablet (50 mg total) by mouth 2 (two) times daily., Disp: 180 tablet, Rfl: 3 .  pantoprazole (PROTONIX) 40 MG tablet, TAKE 1 TABLET(40 MG) BY MOUTH DAILY, Disp: 90 tablet, Rfl: 1 .  potassium chloride SA (KLOR-CON) 20 MEQ tablet, TAKE 1 TABLET(20 MEQ) BY MOUTH TWICE DAILY, Disp: 180 tablet, Rfl: 0 .  Rimegepant Sulfate (NURTEC) 75 MG TBDP, Take  75 mg by mouth daily as needed. For migraines. Take as close to onset of migraine as possible. One daily maximum. (Patient taking differently: Take 75 mg by mouth daily as needed (For migraines).), Disp: 10 tablet, Rfl: 6 .  rosuvastatin (CRESTOR) 20 MG tablet, TAKE 1 TABLET BY MOUTH EVERY DAY, NEED OFFICE VISIT FOR MORE REFILLS, Disp: 90 tablet, Rfl: 1 .  thiamine (VITAMIN B-1) 50 MG tablet, Take 1 tablet (50 mg total) by mouth daily., Disp: 90 tablet, Rfl: 1 .  traZODone (DESYREL) 150 MG tablet, TAKE 1 TABLET(150 MG) BY MOUTH AT BEDTIME, Disp: 90 tablet, Rfl: 1 .  venlafaxine XR (EFFEXOR-XR) 150 MG 24 hr capsule, TAKE 1 CAPSULE(150 MG) BY MOUTH DAILY WITH BREAKFAST, Disp: 90 capsule, Rfl: 3   Exam: Current vital signs: BP (!) 164/81   Pulse 60   Temp 98 F (36.7 C)   Resp 15   Wt 78.6 kg   LMP 02/12/2014   SpO2 96%   BMI 31.69 kg/m  Vital signs in last 24 hours: Temp:  [98 F (36.7 C)] 98 F (36.7 C) (03/28 1555) Pulse Rate:  [55-61] 60 (03/28 1555) Resp:  [14-22] 15 (03/28 1555) BP: (155-179)/(78-82) 164/81 (03/28 1555) SpO2:  [95 %-98 %] 96 % (03/28 1555) Weight:  [78.6 kg] 78.6 kg (03/28 1500)  GENERAL: Awake, alert in NAD HEENT: - Normocephalic and atraumatic, dry mm,  LUNGS - Normal respiratory effort. SaO2 CV - RRR on tele ABDOMEN - Soft, nontender Ext: warm, well perfused  NEURO:  Mental Status: AA&O to person, place, and time.  Speech/Language: speech is clear and coherent but seems slow although it appears more cognitive etiology leading to slower processing speed.  Naming, repetition,  fluency, and comprehension intact. Cranial Nerves:  II: PERRL__2__mm/brisk. visual fields full.  III, IV, VI: EOMI. Lid elevation symmetric and full.  V: Sensation is intact to light touch and symmetrical to face. Blinks to threat.  VII: Face is symmetric resting and smiling. Able to puff cheeks and raise eyebrows.  VIII: Hearing intact to voice IX, X: Palate elevation is symmetric. Phonation normal.  XI: Shoulder shrug appears symmetric XII: Tongue protrudes midline without fasciculations.   Motor: LUE and LLE 3/5 RUE appears to be at baseline. Grip strength is appropriate in bilateral hands. Tone is normal. Bulk is normal.  Sensation- Intact to light touch bilaterally in all four extremities. No extinction to DSS present.  Coordination: FTN intact bilaterally. HKS intact bilaterally. No pronator drift. Alternating hand movements.  DTRs: 2+ throughout.  Gait- deferred  1a Level of Conscious.: 0 1b LOC Questions: 0 1c LOC Commands: 0 2 Best Gaze: 0 3 Visual: 0 4 Facial Palsy: 0 5a Motor Arm - left: 1 5b Motor Arm - Right: 0 6a Motor Leg - Left: 1 6b Motor Leg - Right: 0 7 Limb Ataxia: 0 8 Sensory: 1 9 Best Language: 0 10 Dysarthria: 0 11 Extinct. and Inatten.: 0 TOTAL: 3   Labs I have reviewed labs in epic and the results pertinent to this consultation are:   CBC    Component Value Date/Time   WBC 8.4 03/15/2021 1025   RBC 4.65 03/15/2021 1025   HGB 15.0 03/15/2021 1025   HCT 43.7 03/15/2021 1025   PLT 322.0 03/15/2021 1025   MCV 94.0 03/15/2021 1025   MCH 31.6 04/27/2020 1636   MCHC 34.4 03/15/2021 1025   RDW 13.8 03/15/2021 1025   LYMPHSABS 2.0 03/15/2021 1025   MONOABS 0.6 03/15/2021 1025  EOSABS 0.3 03/15/2021 1025   BASOSABS 0.1 03/15/2021 1025    CMP     Component Value Date/Time   NA 143 03/15/2021 1025   NA 142 10/31/2020 1042   K 3.7 03/15/2021 1025   CL 104 03/15/2021 1025   CO2 29 03/15/2021 1025   GLUCOSE 131 (H) 03/15/2021 1025   BUN 12  03/15/2021 1025   BUN 9 10/31/2020 1042   CREATININE 0.81 03/15/2021 1025   CREATININE 0.72 08/22/2020 0839   CALCIUM 9.3 03/15/2021 1025   PROT 5.7 (L) 04/27/2020 1636   ALBUMIN 3.1 (L) 04/27/2020 1636   AST 20 04/27/2020 1636   ALT 16 04/27/2020 1636   ALKPHOS 83 04/27/2020 1636   BILITOT 0.7 04/27/2020 1636   GFRNONAA 91 10/31/2020 1042   GFRNONAA 95 08/22/2020 0839   GFRAA 105 10/31/2020 1042   GFRAA 110 08/22/2020 0839    Lipid Panel     Component Value Date/Time   CHOL 121 03/15/2021 1025   TRIG 118.0 03/15/2021 1025   HDL 40.80 03/15/2021 1025   CHOLHDL 3 03/15/2021 1025   VLDL 23.6 03/15/2021 1025   LDLCALC 57 03/15/2021 1025   LDLDIRECT 152.1 01/19/2013 0905     Imaging I have reviewed the images obtained:  CT Angio Head W or Wo Contrast  Result Date: 03/20/2021 CLINICAL DATA:  Code stroke follow-up, weakness EXAM: CT ANGIOGRAPHY HEAD AND NECK TECHNIQUE: Multidetector CT imaging of the head and neck was performed using the standard protocol during bolus administration of intravenous contrast. Multiplanar CT image reconstructions and MIPs were obtained to evaluate the vascular anatomy. Carotid stenosis measurements (when applicable) are obtained utilizing NASCET criteria, using the distal internal carotid diameter as the denominator. CONTRAST:  53mL OMNIPAQUE IOHEXOL 350 MG/ML SOLN COMPARISON:  None. FINDINGS: CTA NECK Aortic arch: Minimal plaque along the arch. Great vessel origins are patent. Right carotid system: Patent. Calcified plaque along the proximal ICA causing approximately 50% stenosis. Left carotid system: Patent. Mixed but primarily noncalcified plaque along the proximal ICA causing less than 50% stenosis. Vertebral arteries: Patent and codominant.  No stenosis. Skeleton: Mild cervical spine degenerative changes primarily at C5-C6. Other neck: Unremarkable. Upper chest: No apical lung mass. Review of the MIP images confirms the above findings CTA HEAD Anterior  circulation: Intracranial internal carotid arteries are patent. Middle and anterior cerebral arteries are patent. Posterior circulation: Intracranial vertebral arteries, basilar artery, and posterior cerebral arteries are patent. Venous sinuses: Patent as allowed by contrast bolus timing. Review of the MIP images confirms the above findings IMPRESSION: No large vessel occlusion, hemodynamically significant stenosis, or evidence dissection. Calcified plaque at the proximal right ICA causes approximately 50% stenosis. Electronically Signed   By: Macy Mis M.D.   On: 03/20/2021 16:04   CT Angio Neck W and/or Wo Contrast  Result Date: 03/20/2021 CLINICAL DATA:  Code stroke follow-up, weakness EXAM: CT ANGIOGRAPHY HEAD AND NECK TECHNIQUE: Multidetector CT imaging of the head and neck was performed using the standard protocol during bolus administration of intravenous contrast. Multiplanar CT image reconstructions and MIPs were obtained to evaluate the vascular anatomy. Carotid stenosis measurements (when applicable) are obtained utilizing NASCET criteria, using the distal internal carotid diameter as the denominator. CONTRAST:  72mL OMNIPAQUE IOHEXOL 350 MG/ML SOLN COMPARISON:  None. FINDINGS: CTA NECK Aortic arch: Minimal plaque along the arch. Great vessel origins are patent. Right carotid system: Patent. Calcified plaque along the proximal ICA causing approximately 50% stenosis. Left carotid system: Patent. Mixed but primarily noncalcified plaque  along the proximal ICA causing less than 50% stenosis. Vertebral arteries: Patent and codominant.  No stenosis. Skeleton: Mild cervical spine degenerative changes primarily at C5-C6. Other neck: Unremarkable. Upper chest: No apical lung mass. Review of the MIP images confirms the above findings CTA HEAD Anterior circulation: Intracranial internal carotid arteries are patent. Middle and anterior cerebral arteries are patent. Posterior circulation: Intracranial  vertebral arteries, basilar artery, and posterior cerebral arteries are patent. Venous sinuses: Patent as allowed by contrast bolus timing. Review of the MIP images confirms the above findings IMPRESSION: No large vessel occlusion, hemodynamically significant stenosis, or evidence dissection. Calcified plaque at the proximal right ICA causes approximately 50% stenosis. Electronically Signed   By: Macy Mis M.D.   On: 03/20/2021 16:04   CT HEAD CODE STROKE WO CONTRAST  Result Date: 03/20/2021 CLINICAL DATA:  Code stroke.  Weakness EXAM: CT HEAD WITHOUT CONTRAST TECHNIQUE: Contiguous axial images were obtained from the base of the skull through the vertex without intravenous contrast. COMPARISON:  04/27/2020 FINDINGS: Brain: No acute intracranial hemorrhage, mass effect, or edema. Gray-white differentiation is preserved. Ventricles and sulci are normal in size and configuration. No extra-axial collection. Vascular: No hyperdense vessel. Skull: Unremarkable. Sinuses/Orbits: No acute finding. Other: Mastoid air cells are clear. ASPECTS ([place] Stroke Program Early CT Score) - Ganglionic level infarction (caudate, lentiform nuclei, internal capsule, insula, M1-M3 cortex): 7 - Supraganglionic infarction (M4-M6 cortex): 3 Total score (0-10 with 10 being normal): 10 IMPRESSION: There is no acute intracranial hemorrhage or evidence of acute infarction. ASPECT score is 10. These results were called by telephone at the time of interpretation on 03/20/2021 at 3:38 pm to provider Dr. Lorrin Goodell, who verbally acknowledged these results. Electronically Signed   By: Macy Mis M.D.   On: 03/20/2021 15:42      Impression: Mrs. Virden is a 54 yo patient who has no known hx of CVA, but does have hx of seizure, HTN,  T2DM, GERD, anxiety, migraines, and HLD. Patient home medication include antihypertensives a low dose rosuvastatin. Patient physical exam is concerning for ischemic stroke; however imaging was negative. CT  A Neck did find proximal right ICA with approx 50% stenosis also increasing risk of embolic stroke. Patient A1c is 6.8%. Decreased concern for seizure at this time as patient continues to present with neurological deficits with no return to baseline over a 3 hour period. Will continue to monitor patient.  Recommendations: Plan: - Frequent NeuroChecks for post tPA care per stroke unit protocol: - Initial CTH demonstrated no acute hemorrhage or mass - MRI Brain - pending - CTA - no LVO - TTE - pending. - Lipid Panel: LDL - pending  - Statin: if LDL > 70 - HbA1c: pending. - Antithrombotic: Start ASA 81 mg daily if 24 h CTH does not show acute hemorrhage - DVT prophylaxis: SCDs. Pharmacologic prophylaxis if 24 h CTH does not demonstrate acute hemorrhage - Systolic Blood Pressure goal: < 180 mm Hg, DBP < 105. - Telemetry monitoring for arrhythmia: 72 hours - Swallow screen - ordered - PT/OT/SLP consults   HTN: - hold home AntiHTN agents for now. - will gradually introduce as tolerated.  DM2 - SSI. - Carb controlled diet  COPD: - continue home albuterol   Update:  At 2 hours post tPA check, patient reported back pain along with unable to urinate and new R leg weakness. Bladder scan with 500+ ml of urine and now has a R Leg drift with a NIHSS of 4. No hyperreflexia. On  my exam also endorses decreased pinprick from mid chest and below. Given her fall and the fact that she got tPA, will get an MRI Brain, C, T spine to rule out any epidural hematoma or ICH.    [place] Pager Number [phone]  This patient is critically ill and at significant risk of neurological worsening, death and care requires constant monitoring of vital signs, hemodynamics,respiratory and cardiac monitoring, neurological assessment, discussion with family, other specialists and medical decision making of high complexity. I spent 60 minutes of neurocritical care time  in the care  of  this patient. This was time spent independent of any time provided by nurse practitioner or PA.  Donnetta Simpers [facility] Pager Number [phone] 03/20/2021  7:19 PM

## 2021-03-20 NOTE — Code Documentation (Signed)
Stroke Response Nurse Documentation Code Documentation  Ashley [name] is a 55 y.o. female arriving to [place] ED via [facility] on 03/20/2021 with past medical hx of DM, COPD, and anxiety. Code stroke was activated by ED. Patient from home where she was LKW at 1300. Pt was at her home and woke up normal this morning. At 1300, she felt dizzy and reported sudden fall with left sided weakness. EMS called and originally just transported for fall.   Stroke team at the bedside on patient activation. Labs drawn and patient cleared for CT by EDP. Patient to CT with team. NIHSS 4, see documentation for details and code stroke times. Patient with left arm weakness, left leg weakness and left decreased sensation on exam. The following imaging was completed:  CT, CTA head and neck. Patient is a candidate for tPA and it was started in CT. Care/Plan: Post-tPA monitoring, BP < 180/105. Bedside handoff with ED RN Judson Roch.    Kathrin Greathouse  Stroke Response RN

## 2021-03-21 ENCOUNTER — Inpatient Hospital Stay (HOSPITAL_COMMUNITY): Payer: 59

## 2021-03-21 ENCOUNTER — Encounter (HOSPITAL_COMMUNITY): Payer: Self-pay | Admitting: Neurology

## 2021-03-21 ENCOUNTER — Other Ambulatory Visit: Payer: Self-pay | Admitting: Internal Medicine

## 2021-03-21 DIAGNOSIS — E1165 Type 2 diabetes mellitus with hyperglycemia: Secondary | ICD-10-CM

## 2021-03-21 DIAGNOSIS — I6389 Other cerebral infarction: Secondary | ICD-10-CM

## 2021-03-21 DIAGNOSIS — R4189 Other symptoms and signs involving cognitive functions and awareness: Secondary | ICD-10-CM

## 2021-03-21 DIAGNOSIS — E118 Type 2 diabetes mellitus with unspecified complications: Secondary | ICD-10-CM

## 2021-03-21 DIAGNOSIS — I6300 Cerebral infarction due to thrombosis of unspecified precerebral artery: Secondary | ICD-10-CM

## 2021-03-21 LAB — ECHOCARDIOGRAM COMPLETE
Area-P 1/2: 3.72 cm2
S' Lateral: 2.3 cm
Weight: 2666.68 oz

## 2021-03-21 LAB — LIPID PANEL
Cholesterol: 97 mg/dL (ref 0–200)
HDL: 32 mg/dL — ABNORMAL LOW (ref >40)
LDL Cholesterol: 49 mg/dL (ref 0–99)
Total CHOL/HDL Ratio: 3 RATIO
Triglycerides: 78 mg/dL (ref <150)
VLDL: 16 mg/dL (ref 0–40)

## 2021-03-21 LAB — GLUCOSE, CAPILLARY
Glucose-Capillary: 164 mg/dL — ABNORMAL HIGH (ref 70–99)
Glucose-Capillary: 125 mg/dL — ABNORMAL HIGH (ref 70–99)
Glucose-Capillary: 136 mg/dL — ABNORMAL HIGH (ref 70–99)
Glucose-Capillary: 138 mg/dL — ABNORMAL HIGH (ref 70–99)

## 2021-03-21 LAB — HEMOGLOBIN A1C
Hgb A1c MFr Bld: 6.8 % — ABNORMAL HIGH (ref 4.8–5.6)
Mean Plasma Glucose: 148.46 mg/dL

## 2021-03-21 LAB — HIV ANTIBODY (ROUTINE TESTING W REFLEX): HIV Screen 4th Generation wRfx: NONREACTIVE

## 2021-03-21 MED ORDER — PROMETHAZINE HCL 12.5 MG PO TABS
12.5000 mg | ORAL_TABLET | Freq: Four times a day (QID) | ORAL | Status: DC | PRN
  Administered 2021-03-21 (×2): 12.5 mg via ORAL
  Filled 2021-03-21 (×3): qty 1

## 2021-03-21 MED ORDER — BETHANECHOL CHLORIDE 10 MG PO TABS: 10.0000 mg | ORAL_TABLET | Freq: Three times a day (TID) | ORAL | Status: DC

## 2021-03-21 MED ORDER — PANTOPRAZOLE SODIUM 40 MG PO TBEC
40.0000 mg | DELAYED_RELEASE_TABLET | Freq: Every day | ORAL | Status: DC
  Administered 2021-03-21: 40 mg via ORAL
  Filled 2021-03-21: qty 1

## 2021-03-21 MED ORDER — LABETALOL HCL 5 MG/ML IV SOLN: 20.0000 mg | INTRAVENOUS | Status: DC | PRN

## 2021-03-21 MED ORDER — ASPIRIN 81 MG PO CHEW
81.0000 mg | CHEWABLE_TABLET | Freq: Every day | ORAL | Status: DC
  Administered 2021-03-21 – 2021-03-22 (×2): 81 mg via ORAL
  Filled 2021-03-21 (×2): qty 1

## 2021-03-21 MED ORDER — IRBESARTAN 150 MG PO TABS
150.0000 mg | ORAL_TABLET | Freq: Every day | ORAL | Status: DC
  Administered 2021-03-21 – 2021-03-22 (×2): 150 mg via ORAL
  Filled 2021-03-21 (×2): qty 1

## 2021-03-21 NOTE — Progress Notes (Signed)
  Echocardiogram 2D Echocardiogram has been performed.  Ashley [name] 03/21/2021, 1:32 PM

## 2021-03-21 NOTE — Progress Notes (Signed)
vLTM started follow routine EEG. Same leads. notified neuro

## 2021-03-21 NOTE — Evaluation (Signed)
Occupational Therapy Evaluation Patient Details Name: Ashley [name] MRN: [id number] DOB: 11-18-1966 Today's Date: 03/21/2021    History of Present Illness 55 yo female admitted to [facility] on 3/28 for fall, L sided weakness, +head trauma. CTH negative for hemorrhage, MR C spine negative for acute findings. TPA administered 1533 on 3/28, 3 hours post-administration pt developing inability to urinate and RLE weakness. MRI negative for acute infarct, CTA shows 50% R ICA stenosis. PMH includes seizure disorder, DM, MDD, hypertension, GERD, SVT, dyslipidemia, anxiety, and COPD.   Clinical Impression   PTA, pt was living with her husband who assisted with certain ADLs and with all IADLs at baseline; pt reports she does not use any DME for mobility. Pt presenting with decreased coordination and strength at LUE with decreased incorporation of LUE into grooming tasks. Pt would benefit from further acute OT to facilitate safe dc. Recommend dc to home with HHOT for further OT to optimize safety, independence with ADLs, and return to PLOF.     Follow Up Recommendations  Home health OT;Supervision/Assistance - 24 hour (Initial 24/7)    Equipment Recommendations  3 in 1 bedside commode (as shower seat)    Recommendations for Other Services PT consult     Precautions / Restrictions Precautions Precautions: Fall      Mobility Bed Mobility Overal bed mobility: Needs Assistance Bed Mobility: Supine to Sit     Supine to sit: Min guard     General bed mobility comments: Min Guard A for safety    Transfers Overall transfer level: Needs assistance Equipment used: None Transfers: Sit to/from Stand Sit to Stand: Min assist         General transfer comment: Min A for gaining balance in standing    Balance Overall balance assessment: Needs assistance Sitting-balance support: No upper extremity supported;Feet supported Sitting balance-Leahy Scale: Fair     Standing balance support: Single  extremity supported;Bilateral upper extremity supported;During functional activity Standing balance-Leahy Scale: Poor Standing balance comment: Reliant on atleast one UE for support                           ADL either performed or assessed with clinical judgement   ADL Overall ADL's : Needs assistance/impaired Eating/Feeding: Set up;Sitting   Grooming: Min guard;Oral care;Wash/dry hands;Standing Grooming Details (indicate cue type and reason): Min Guard A for safety while standing at sink. Poor incorporation of LUE into grooming taks. Upper Body Bathing: Minimal assistance;Sitting   Lower Body Bathing: Minimal assistance;Sit to/from stand   Upper Body Dressing : Minimal assistance;Sitting   Lower Body Dressing: Moderate assistance;Sit to/from stand   Toilet Transfer: Minimal assistance;Ambulation;Regular Toilet;RW Armed forces technical officer Details (indicate cue type and reason): Min A for balance and safety         Functional mobility during ADLs: Minimal assistance;Rolling walker General ADL Comments: Pt presenting with L sided weakness, cogivitve deficits, and blurry vision.     Vision Baseline Vision/History: Wears glasses Wears Glasses: Reading only Patient Visual Report: Blurring of vision       Perception     Praxis      Pertinent Vitals/Pain Pain Assessment: No/denies pain     Hand Dominance Right   Extremity/Trunk Assessment Upper Extremity Assessment Upper Extremity Assessment: LUE deficits/detail LUE Deficits / Details: Poor strength and coorindation. Able to perform finger to nose and finger opposition with increased time and cues. Poor grasp strength. Pt with difficulty incorporating LUE into bilateral coorindation. Tendency for  position of flexed elbow towards chest. LUE Sensation:  ("tingling" ; increases with shoulder flexion to 90 degrees) LUE Coordination: decreased fine motor;decreased gross motor   Lower Extremity Assessment Lower Extremity  Assessment: Defer to PT evaluation   Cervical / Trunk Assessment Cervical / Trunk Assessment: Normal   Communication Communication Communication: No difficulties   Cognition Arousal/Alertness: Lethargic (drowsy) Behavior During Therapy: WFL for tasks assessed/performed;Flat affect Overall Cognitive Status: Impaired/Different from baseline Area of Impairment: Problem solving;Following commands                       Following Commands: Follows one step commands with increased time;Follows multi-step commands inconsistently     Problem Solving: Slow processing;Requires verbal cues General Comments: Slow processing and requiring increased time. Pt presenting with flat affect but very willing to participate in therapy.   General Comments  VSS on RA throughout    Exercises     Shoulder Instructions      Home Living Family/patient expects to be discharged to:: Private residence Living Arrangements: Spouse/significant other Available Help at Discharge: Family;Available PRN/intermittently (Husband works full time) Type of Home: House Home Access: Stairs to enter Technical brewer of Steps: 4 Entrance Stairs-Rails: Right;Left;Can reach both Home Layout: One level     Bathroom Shower/Tub: Teacher, early years/pre: Standard     Home Equipment: Environmental consultant - 2 wheels          Prior Functioning/Environment Level of Independence: Needs assistance  Gait / Transfers Assistance Needed: Reports she did not use DME ADL's / Homemaking Assistance Needed: Husband assisting with ADLs including LB ADLs, bathing, and transfers into tub. Husband performs IADLs.            OT Problem List: Decreased activity tolerance;Impaired balance (sitting and/or standing);Decreased knowledge of use of DME or AE;Decreased knowledge of precautions      OT Treatment/Interventions: Self-care/ADL training;Therapeutic exercise;Energy conservation;DME and/or AE instruction;Therapeutic  activities;Patient/family education    OT Goals(Current goals can be found in the care plan section) Acute Rehab OT Goals Patient Stated Goal: Go home OT Goal Formulation: With patient/family Time For Goal Achievement: 04/04/21 Potential to Achieve Goals: Good  OT Frequency: Min 3X/week   Barriers to D/C:            Co-evaluation              AM-PAC OT "6 Clicks" Daily Activity     Outcome Measure Help from another person eating meals?: A Little Help from another person taking care of personal grooming?: A Little Help from another person toileting, which includes using toliet, bedpan, or urinal?: A Little Help from another person bathing (including washing, rinsing, drying)?: A Little Help from another person to put on and taking off regular upper body clothing?: A Little Help from another person to put on and taking off regular lower body clothing?: A Lot 6 Click Score: 17   End of Session Equipment Utilized During Treatment: Rolling walker Nurse Communication: Mobility status  Activity Tolerance: Patient limited by fatigue Patient left: in chair;with call bell/phone within reach  OT Visit Diagnosis: Unsteadiness on feet (R26.81);Other abnormalities of gait and mobility (R26.89);Muscle weakness (generalized) (M62.81)                Time: 9371-6967 OT Time Calculation (min): 22 min Charges:  OT General Charges $OT Visit: 1 Visit OT Evaluation $OT Eval Moderate Complexity: Imogene, OTR/L Acute Rehab Pager: [phone] Office: [phone]  [place] 03/21/2021, 10:14 AM

## 2021-03-21 NOTE — Evaluation (Signed)
Physical Therapy Evaluation Patient Details Name: Ashley [name] MRN: [id number] DOB: Dec 12, 1966 Today's Date: 03/21/2021   History of Present Illness  55 yo female admitted to [facility] on 3/28 for fall, L sided weakness, +head trauma. CTH negative for hemorrhage, MR C spine negative for acute findings. TPA administered 1533 on 3/28, 3 hours post-administration pt developing inability to urinate and RLE weakness. MRI negative for acute infarct, CTA shows 50% R ICA stenosis. PMH includes seizure disorder, DM, MDD, hypertension, GERD, SVT, dyslipidemia, anxiety, and COPD.  Clinical Impression   Pt presents with generalized weakness more impaired on MMT assessment vs functionally, impaired standing balance with history of falls (50+ falls in the past year per pt and family), and decreased activity tolerance. Pt to benefit from acute PT to address deficits. Pt ambulated short hallway distance with close guard for safety, pt with very mild incoordination during gait with pt endorsing altered sensation to bilateral feet. Gait distance limited by reports of feeling "hot and cold on my neck, like I am going to pass out". Pt does not demonstrate orthostatic hypotension (see below), or associated s/s like diaphoresis, pallor, severe lightheadedness. PT recommending home with HHPT for optimizing home set up, improving pt functional mobility, and decreasing fall risk. PT recommends increased assist from husband once home if possible with work. PT to progress mobility as tolerated, and will continue to follow acutely.   BP, HR: - Sitting: 140/69, 63 - Standing 0 min: 139/73, 73 - Standing 3 min: 133/84, 63 - Return to supine post-gait: 158/67, 62    Follow Up Recommendations Home health PT;Supervision for mobility/OOB    Equipment Recommendations  None recommended by PT    Recommendations for Other Services       Precautions / Restrictions Precautions Precautions: Fall Restrictions Weight Bearing  Restrictions: No      Mobility  Bed Mobility Overal bed mobility: Needs Assistance Bed Mobility: Sit to Supine       Sit to supine: Supervision   General bed mobility comments: pt up in chair upon PT arrival to room, supervision for return to supine for lines/leads, increased time.    Transfers Overall transfer level: Needs assistance Equipment used: Rolling walker (2 wheeled) Transfers: Sit to/from Stand Sit to Stand: Min guard         General transfer comment: for safety, no physical assist provided  Ambulation/Gait Ambulation/Gait assistance: Min guard Gait Distance (Feet): 90 Feet Assistive device: Rolling walker (2 wheeled) Gait Pattern/deviations: Step-through pattern;Decreased stride length;Trunk flexed Gait velocity: decr   General Gait Details: min guard for safety, VSS with exception of poor SpO2 reading midwalk secondary to RW use. Pt endorsing "my neck feels cold and hot at the same time, like I am going to pass out". VSS during this, including BP, with no diaphoresis, pallor noted.  Stairs            Wheelchair Mobility    Modified Rankin (Stroke Patients Only) Modified Rankin (Stroke Patients Only) Pre-Morbid Rankin Score: Moderately severe disability Modified Rankin: Moderately severe disability     Balance Overall balance assessment: Needs assistance Sitting-balance support: No upper extremity supported;Feet supported Sitting balance-Leahy Scale: Fair     Standing balance support: Single extremity supported;Bilateral upper extremity supported;During functional activity Standing balance-Leahy Scale: Poor Standing balance comment: Reliant on UE support                             Pertinent Vitals/Pain Pain  Assessment: No/denies pain Faces Pain Scale: Hurts a little bit Pain Intervention(s): Monitored during session    Home Living Family/patient expects to be discharged to:: Private residence Living Arrangements:  Spouse/significant other Available Help at Discharge: Family;Available PRN/intermittently (Husband works full time) Type of Home: House Home Access: Stairs to enter Entrance Stairs-Rails: Right;Left;Can reach both Technical brewer of Steps: 4 Home Layout: One level Home Equipment: Environmental consultant - 2 wheels      Prior Function Level of Independence: Needs assistance   Gait / Transfers Assistance Needed: Reports she did not use DME, states "my house is set up so I can hold onto the walls and furniture"  ADL's / [place] Needed: Husband assisting with ADLs including LB ADLs, bathing, and transfers into tub. Husband performs IADLs.        Hand Dominance   Dominant Hand: Right    Extremity/Trunk Assessment   Upper Extremity Assessment Upper Extremity Assessment: Defer to OT evaluation    Lower Extremity Assessment Lower Extremity Assessment: Generalized weakness;RLE deficits/detail;LLE deficits/detail RLE Deficits / Details: Pt demonstrates MMT as follows: 3/5 knee extension, 3+/5 knee flexion, 3/5 hip flexion (not against resistance given pt fatigue), 1/5 DF, at least 3/5 PF (not against resistance). Functionally, pt does not demonstrate foot drop during gait RLE Sensation:  (reports decreased sensation R foot) LLE Deficits / Details: Pt demonstrates MMT as follows: 3/5 knee extension, 3/5 knee flexion, 3/5 hip flexion (not against resistance given pt fatigue), 3/5 DF, at least 3/5 PF (not against resistance). LLE Sensation: decreased light touch (pt reports pins and needles sensation over entire L leg)    Cervical / Trunk Assessment Cervical / Trunk Assessment: Normal  Communication   Communication: No difficulties  Cognition Arousal/Alertness: Awake/alert Behavior During Therapy: Flat affect Overall Cognitive Status: Impaired/Different from baseline Area of Impairment: Safety/judgement;Following commands;Problem solving                       Following  Commands: Follows one step commands with increased time Safety/Judgement: Decreased awareness of safety   Problem Solving: Slow processing;Requires verbal cues General Comments: Increased processing time to perform mobility commands, at times requires repeated cues. Pt appears to have poor safety awareness at home, stating that her system to prevent fallls is holding onto walls/furniture, endorses 50+ falls in the last year.      General Comments      Exercises     Assessment/Plan    PT Assessment Patient needs continued PT services  PT Problem List Decreased mobility;Decreased strength;Decreased activity tolerance;Decreased balance;Decreased knowledge of use of DME;Decreased safety awareness;Impaired sensation;Decreased cognition       PT Treatment Interventions DME instruction;Therapeutic activities;Gait training;Patient/family education;Therapeutic exercise;Balance training;Stair training;Functional mobility training;Neuromuscular re-education    PT Goals (Current goals can be found in the Care Plan section)  Acute Rehab PT Goals Patient Stated Goal: rest PT Goal Formulation: With patient/family Time For Goal Achievement: 04/04/21 Potential to Achieve Goals: Good    Frequency Min 3X/week   Barriers to discharge        Co-evaluation               AM-PAC PT "6 Clicks" Mobility  Outcome Measure Help needed turning from your back to your side while in a flat bed without using bedrails?: A Little Help needed moving from lying on your back to sitting on the side of a flat bed without using bedrails?: A Little Help needed moving to and from a bed to a chair (  including a wheelchair)?: A Little Help needed standing up from a chair using your arms (e.g., wheelchair or bedside chair)?: A Little Help needed to walk in hospital room?: A Little Help needed climbing 3-5 steps with a railing? : A Little 6 Click Score: 18    End of Session Equipment Utilized During Treatment:  Gait belt Activity Tolerance: Patient tolerated treatment well Patient left: in bed;with call bell/phone within reach;with bed alarm set;with family/visitor present;with nursing/sitter in room Nurse Communication: Mobility status PT Visit Diagnosis: Other abnormalities of gait and mobility (R26.89);Difficulty in walking, not elsewhere classified (R26.2)    Time: 1015-1040 PT Time Calculation (min) (ACUTE ONLY): 25 min   Charges:   PT Evaluation $PT Eval Low Complexity: 1 Low PT Treatments $Gait Training: 8-22 mins        Stacie Glaze, PT Acute Rehabilitation Services Pager [phone]  Office [phone]   Roxine Caddy E Ruffin Pyo 03/21/2021, 1:44 PM

## 2021-03-21 NOTE — Progress Notes (Addendum)
STROKE TEAM PROGRESS NOTE   INTERVAL HISTORY Her husband is at the bedside.  She reports that yesterday she got up to go into the kitchen. She then had a "seizure" and lost consciousness and woke up on the floor. She reports that when she woke up she called her husband and he recommended calling EMS. She reported some difficulty getting to the phone because she had left sided weakness. Husband states that when she called him he did not remember her having slurred speech and thought she was talking normal.They brought her to the hospital and she received tPA. She reports that she still has numbness on her left side.  She reports that she has been having seizures for a while now and her last episode happened 03/19/21. She told her husband she was dizzy and as she was walking to the bathroom she passed out, her husband had been walking behind her and caught her. He reports she was "out" for about 20-30 seconds. Her episodes usually last 1 to 2 minutes. Upon review of patient's prior neurological records she has seen Dr. Jaynee Eagles in the office for multifocal neurological symptoms and thought to have nonorganic etiology for her complaints.  She also has migraine headaches. Vitals:   03/21/21 0400 03/21/21 0500 03/21/21 0600 03/21/21 0700  BP: (!) 166/78 (!) 158/70 (!) 143/63 (!) 142/65  Pulse: (!) 57 (!) 55 66 64  Resp: 15 10 14 14   Temp: 97.6 F (36.4 C)     TempSrc: Oral     SpO2: 93% 97% 92% 92%  Weight:  75.6 kg     CBC:  Recent Labs  Lab 03/15/21 1025 03/20/21 1614 03/20/21 1633  WBC 8.4 8.0  --   NEUTROABS 5.5 5.4  --   HGB 15.0 14.9 13.9  HCT 43.7 43.6 41.0  MCV 94.0 94.6  --   PLT 322.0 308  --    Basic Metabolic Panel:  Recent Labs  Lab 03/15/21 1025 03/20/21 1614 03/20/21 1633  NA 143 139 142  K 3.7 3.2* 3.1*  CL 104 102 101  CO2 29 28  --   GLUCOSE 131* 103* 98  BUN 12 7 6   CREATININE 0.81 0.81 0.80  CALCIUM 9.3 8.9  --    Lipid Panel:  Recent Labs  Lab 03/21/21 0011   CHOL 97  TRIG 78  HDL 32*  CHOLHDL 3.0  VLDL 16  LDLCALC 49   HgbA1c:  Recent Labs  Lab 03/21/21 0011  HGBA1C 6.8*   Urine Drug Screen:  Recent Labs  Lab 03/20/21 2000  LABOPIA NONE DETECTED  COCAINSCRNUR NONE DETECTED  LABBENZ NONE DETECTED  AMPHETMU NONE DETECTED  THCU NONE DETECTED  LABBARB NONE DETECTED    Alcohol Level  Recent Labs  Lab 03/20/21 1614  ETH <10    IMAGING past 24 hours CT Angio Head W or Wo Contrast  Result Date: 03/20/2021 CLINICAL DATA:  Code stroke follow-up, weakness EXAM: CT ANGIOGRAPHY HEAD AND NECK TECHNIQUE: Multidetector CT imaging of the head and neck was performed using the standard protocol during bolus administration of intravenous contrast. Multiplanar CT image reconstructions and MIPs were obtained to evaluate the vascular anatomy. Carotid stenosis measurements (when applicable) are obtained utilizing NASCET criteria, using the distal internal carotid diameter as the denominator. CONTRAST:  7mL OMNIPAQUE IOHEXOL 350 MG/ML SOLN COMPARISON:  None. FINDINGS: CTA NECK Aortic arch: Minimal plaque along the arch. Great vessel origins are patent. Right carotid system: Patent. Calcified plaque along the proximal ICA causing  approximately 50% stenosis. Left carotid system: Patent. Mixed but primarily noncalcified plaque along the proximal ICA causing less than 50% stenosis. Vertebral arteries: Patent and codominant.  No stenosis. Skeleton: Mild cervical spine degenerative changes primarily at C5-C6. Other neck: Unremarkable. Upper chest: No apical lung mass. Review of the MIP images confirms the above findings CTA HEAD Anterior circulation: Intracranial internal carotid arteries are patent. Middle and anterior cerebral arteries are patent. Posterior circulation: Intracranial vertebral arteries, basilar artery, and posterior cerebral arteries are patent. Venous sinuses: Patent as allowed by contrast bolus timing. Review of the MIP images confirms the  above findings IMPRESSION: No large vessel occlusion, hemodynamically significant stenosis, or evidence dissection. Calcified plaque at the proximal right ICA causes approximately 50% stenosis. Electronically Signed   By: Macy Mis M.D.   On: 03/20/2021 16:04   CT Angio Neck W and/or Wo Contrast  Result Date: 03/20/2021 CLINICAL DATA:  Code stroke follow-up, weakness EXAM: CT ANGIOGRAPHY HEAD AND NECK TECHNIQUE: Multidetector CT imaging of the head and neck was performed using the standard protocol during bolus administration of intravenous contrast. Multiplanar CT image reconstructions and MIPs were obtained to evaluate the vascular anatomy. Carotid stenosis measurements (when applicable) are obtained utilizing NASCET criteria, using the distal internal carotid diameter as the denominator. CONTRAST:  50mL OMNIPAQUE IOHEXOL 350 MG/ML SOLN COMPARISON:  None. FINDINGS: CTA NECK Aortic arch: Minimal plaque along the arch. Great vessel origins are patent. Right carotid system: Patent. Calcified plaque along the proximal ICA causing approximately 50% stenosis. Left carotid system: Patent. Mixed but primarily noncalcified plaque along the proximal ICA causing less than 50% stenosis. Vertebral arteries: Patent and codominant.  No stenosis. Skeleton: Mild cervical spine degenerative changes primarily at C5-C6. Other neck: Unremarkable. Upper chest: No apical lung mass. Review of the MIP images confirms the above findings CTA HEAD Anterior circulation: Intracranial internal carotid arteries are patent. Middle and anterior cerebral arteries are patent. Posterior circulation: Intracranial vertebral arteries, basilar artery, and posterior cerebral arteries are patent. Venous sinuses: Patent as allowed by contrast bolus timing. Review of the MIP images confirms the above findings IMPRESSION: No large vessel occlusion, hemodynamically significant stenosis, or evidence dissection. Calcified plaque at the proximal right  ICA causes approximately 50% stenosis. Electronically Signed   By: Macy Mis M.D.   On: 03/20/2021 16:04   MR BRAIN WO CONTRAST  Result Date: 03/20/2021 CLINICAL DATA:  Right-sided weakness EXAM: MRI HEAD WITHOUT CONTRAST TECHNIQUE: Multiplanar, multiecho pulse sequences of the brain and surrounding structures were obtained without intravenous contrast. COMPARISON:  04/27/2020 FINDINGS: Brain: No acute infarct, mass effect or extra-axial collection. No acute or chronic hemorrhage. Normal white matter signal, parenchymal volume and CSF spaces. The midline structures are normal. Vascular: Major flow voids are preserved. Skull and upper cervical spine: Normal calvarium and skull base. Visualized upper cervical spine and soft tissues are normal. Sinuses/Orbits:No paranasal sinus fluid levels or advanced mucosal thickening. No mastoid or middle ear effusion. Normal orbits. IMPRESSION: Normal brain MRI. Electronically Signed   By: Ulyses Jarred M.D.   On: 03/20/2021 23:51   MR CERVICAL SPINE WO CONTRAST  Result Date: 03/20/2021 CLINICAL DATA:  progressive myelopathy with right-sided weakness EXAM: MRI CERVICAL AND THORACIC SPINE WITHOUT CONTRAST TECHNIQUE: Multiplanar and multiecho pulse sequences of the cervical spine, to include the craniocervical junction and cervicothoracic junction, and the thoracic spine, were obtained without intravenous contrast. COMPARISON:  None. FINDINGS: MRI CERVICAL SPINE FINDINGS Alignment: Unchanged grade 1 retrolisthesis at C5-6. Vertebrae: No fracture, evidence of  discitis, or bone lesion. Cord: Normal signal and morphology. Posterior Fossa, vertebral arteries, paraspinal tissues: Negative. Disc levels: C2-3: Unremarkable. C3-4: Unremarkable. C4-5: Unremarkable. C5-6: Disc space narrowing with small bulge and uncovertebral hypertrophy. No spinal canal stenosis. Mild right and moderate left foraminal stenosis. C6-7: Unremarkable. C7-T1: Unremarkable. MRI THORACIC SPINE  FINDINGS Alignment:  Physiologic. Vertebrae: No fracture, evidence of discitis, or bone lesion. Cord:  Normal signal and morphology. Paraspinal and other soft tissues: Negative. Disc levels: This intervertebral disc spaces are normal. IMPRESSION: 1. Normal spinal cord. 2. Mild right and moderate left C5-6 neural foraminal stenosis. No cervical spinal canal stenosis. 3. Normal thoracic spine. Electronically Signed   By: Ulyses Jarred M.D.   On: 03/20/2021 23:58   MR THORACIC SPINE WO CONTRAST  Result Date: 03/20/2021 CLINICAL DATA:  progressive myelopathy with right-sided weakness EXAM: MRI CERVICAL AND THORACIC SPINE WITHOUT CONTRAST TECHNIQUE: Multiplanar and multiecho pulse sequences of the cervical spine, to include the craniocervical junction and cervicothoracic junction, and the thoracic spine, were obtained without intravenous contrast. COMPARISON:  None. FINDINGS: MRI CERVICAL SPINE FINDINGS Alignment: Unchanged grade 1 retrolisthesis at C5-6. Vertebrae: No fracture, evidence of discitis, or bone lesion. Cord: Normal signal and morphology. Posterior Fossa, vertebral arteries, paraspinal tissues: Negative. Disc levels: C2-3: Unremarkable. C3-4: Unremarkable. C4-5: Unremarkable. C5-6: Disc space narrowing with small bulge and uncovertebral hypertrophy. No spinal canal stenosis. Mild right and moderate left foraminal stenosis. C6-7: Unremarkable. C7-T1: Unremarkable. MRI THORACIC SPINE FINDINGS Alignment:  Physiologic. Vertebrae: No fracture, evidence of discitis, or bone lesion. Cord:  Normal signal and morphology. Paraspinal and other soft tissues: Negative. Disc levels: This intervertebral disc spaces are normal. IMPRESSION: 1. Normal spinal cord. 2. Mild right and moderate left C5-6 neural foraminal stenosis. No cervical spinal canal stenosis. 3. Normal thoracic spine. Electronically Signed   By: Ulyses Jarred M.D.   On: 03/20/2021 23:58   CT C-SPINE NO CHARGE  Result Date: 03/20/2021 CLINICAL DATA:   Fall. EXAM: CT CERVICAL SPINE WITHOUT CONTRAST TECHNIQUE: Multidetector CT imaging of the cervical spine was performed without intravenous contrast. Multiplanar CT image reconstructions were also generated. Images reformatted from head and neck CTA, reported separately. COMPARISON:  No prior CT. FINDINGS: Alignment: Trace scoliotic curvature. No listhesis or traumatic subluxation. Skull base and vertebrae: No acute fracture. Vertebral body heights are maintained. The dens and skull base are intact. Soft tissues and spinal canal: No prevertebral soft tissue edema. Vascular structures assistant concurrent neck CT, reported separately. Disc levels: Mild multilevel endplate spurring. Disc space narrowing at C5-C6 with mild canal stenosis Upper chest: Only minimally included, grossly negative. Other: None. IMPRESSION: Mild degenerative change in the cervical spine without acute fracture or subluxation. Electronically Signed   By: Keith Rake M.D.   On: 03/20/2021 17:43   CT HEAD CODE STROKE WO CONTRAST  Result Date: 03/20/2021 CLINICAL DATA:  Code stroke.  Weakness EXAM: CT HEAD WITHOUT CONTRAST TECHNIQUE: Contiguous axial images were obtained from the base of the skull through the vertex without intravenous contrast. COMPARISON:  04/27/2020 FINDINGS: Brain: No acute intracranial hemorrhage, mass effect, or edema. Gray-white differentiation is preserved. Ventricles and sulci are normal in size and configuration. No extra-axial collection. Vascular: No hyperdense vessel. Skull: Unremarkable. Sinuses/Orbits: No acute finding. Other: Mastoid air cells are clear. ASPECTS [facility] Stroke Program Early CT Score) - Ganglionic level infarction (caudate, lentiform nuclei, internal capsule, insula, M1-M3 cortex): 7 - Supraganglionic infarction (M4-M6 cortex): 3 Total score (0-10 with 10 being normal): 10 IMPRESSION: There is no acute  intracranial hemorrhage or evidence of acute infarction. ASPECT score is 10. These  results were called by telephone at the time of interpretation on 03/20/2021 at 3:38 pm to provider Dr. Lorrin Goodell, who verbally acknowledged these results. Electronically Signed   By: Macy Mis M.D.   On: 03/20/2021 15:42    PHYSICAL EXAM Blood pressure (!) 142/65, pulse 64, temperature 97.6 F (36.4 C), temperature source Oral, resp. rate 14, weight 75.6 kg, last menstrual period 02/12/2014, SpO2 92 %.  General: alert and awake, caucasian female, no apparent distress, appears older than stated age  Lungs: Symmetrical Chest rise, no labored breathing  Cardio: Regular Rate and Rhythm  Abdomen: Soft, non-tender  Neuro: Alert, oriented, thought content appropriate.  Speech fluent without evidence of aphasia.  Able to follow all commands without difficulty. Exam was inconsistent on repeat and poor effort was exhibited by patient Cranial Nerves: II:  Visual fields grossly normal, pupils equal, round, reactive to light and accommodation III,IV, VI: ptosis not present, extra-ocular motions intact bilaterally V,VII: smile symmetric, Reports decreased sensation in V2 and V3 distribution on the Left VIII: hearing normal bilaterally IX,X: uvula rises symmetrically XI: bilateral shoulder shrug XII: midline tongue extension without atrophy or fasciculations  Motor: patient had poor and inconsistent effort with giveaway weakness on the left Right : Upper extremity   5/5    Left:     Upper extremity   4/5  Lower extremity   5/5     Lower extremity   4/5 Tone and bulk:normal tone throughout; no atrophy noted Sensory: Reports decreased sensation on Left Arm and Leg but preserved   position sense but impaired vibration sense Cerebellar: normal finger-to-nose Gait: deferred    ASSESSMENT/PLAN Ashley [name] is a 55 y.o. female with history of seizure, T2DM, GERD, anxiety, migraines, HTN, HLD. presenting with right sided weakness; however workup was negative for stroke. Patient presented via  EMS but was not a CODE STROKE until ED provider did preliminary exam. Per EMR patient was reported feeling normal until 1PM today when patient was in the bathroom and felt dizzy. Patient fell forward and hit her head. On initial exam patient denied LOC; however during code stroke patient reports that she is unsure if she had LOC. Patient reported that she was able to get up on her own; however she noted that her left side extremities felt weaker and patient also felt that she has more difficulty talking. Patient reported a right -sided headache on initial exam in the ED, but did not report this during Code Stroke. Patient reports that this presentation is different from her seizures in the past.   Will have PT/OT evaluate her and get her out of bed. She will be 24 hours post tPA this afternoon so will move her out of the ICU. Her BP has been well controlled so far. Will restart her home Irbesartan. Possible discharge tomorrow pending completion of workup. Nurse reported that patient has required 3 in and out foley's as she has not been able to urinate. Discontinued her bed rest orders and started Bethanechol. Will continue to monitor if problem resolves.   TIA like episode likely of nonorganic Etiology from Underlying Psychiatric Issues given ill consistency of sensory deficits and poor effort on exam.  Code Stroke CT head -  There is no acute intracranial hemorrhage or evidence of acute infarction. ASPECT score is 10.  CTA head & neck - No large vessel occlusion, hemodynamically significant stenosis, or evidence dissection. Calcified plaque  at the proximal right ICA causes approximately 50% stenosis.  MRI - Normal brain MRI.  2D Echo - Ordered  EEG - Ordered  LDL 49  HgbA1c 6.8  VTE prophylaxis - SCD's    Diet   Diet heart healthy/carb modified Room service appropriate? Yes; Fluid consistency: Thin     aspirin 81 mg daily prior to admission, now on No antithrombotic as she is post tPA  administration. Will restart Aspirin 81 mg daily tomorrow.  Therapy recommendations:  Pending  Disposition:  Pending  Hypertension  Home meds:  Irbesartan, Metoprolol  Stable . Currently on Irbesartan 150 mg daily . Long-term BP goal normotensive  Hyperlipidemia  Home meds:  Crestor 20 mg, resumed in hospital  LDL 49, goal < 70  Will not change as she is at goal  Continue statin at discharge  Diabetes type II Controlled  Home meds:  Levemir, Dulaglutide  HgbA1c 6.8, goal < 7.0  CBGs Recent Labs    03/20/21 1532 03/20/21 2122 03/21/21 0743  GLUCAP 105* 116* 164*      SSI  Other Stroke Risk Factors  Cigarette smoker advised to stop smoking  Obesity, Body mass index is 30.48 kg/m., BMI >/= 30 associated with increased stroke risk, recommend weight loss, diet and exercise as appropriate   Migraines  Other Active Problems  Possible Urinary Retention - Has required in and out foley 3 times, will D/C active bedrest and start Bethanechol.  Hospital day # 1  Ashley Sanger MD Resident I have personally obtained history,examined this patient, reviewed notes, independently viewed imaging studies, participated in medical decision making and plan of care.ROS completed by me personally and pertinent positives fully documented  I have made any additions or clarifications directly to the above note. Agree with note above.  Patient presents with changing and in consistent history as well as exam findings likely of nonorganic etiology.  Brain imaging and neurovascular work-up has been negative.  She has multiple complaints of seizure like episodes.  Will check EEG and ask neuro hospitalist team to take over her care.  Discussed with patient and husband and Dr. Lorrin Goodell.  Greater than 50% time during this 35-minute visit was spent on counseling and coordination of care about her stroke and seizure-like episodes and answering questions.  Antony Contras, MD Medical  Director [place] Pager: [phone] 03/21/2021 3:11 PM   To contact Stroke Continuity provider, please refer to [email or web address] After hours, contact General Neurology

## 2021-03-21 NOTE — Progress Notes (Signed)
Brief Neuro Update:  Discussed with Dr. Leonie Man and reviewed chart. Patient reporting seizures for a while with loss of consciousness followed by unilateral weakness, left vs right.  Will get LTM for 24 hours for event capture. If negative, will discontinue.  Patient will be followed by Neurohospitalist team instead of stroke team.  Donnetta Simpers [facility] Pager Number [phone]

## 2021-03-21 NOTE — Progress Notes (Signed)
Pt Ashley [name] a transfer from 4N ICU  To 3 W 28 via W/C with continuous Over night EEG monitoring. Pt awake alert and oriented to person ,place and month and year . MAE with slight weakness noted to left hand.  Endorsed Headache rated 6//10. Pupils PERRL  Cardiac monitor in use . Plan of care discussed with pt and pt demonstrated Good understanding. Fall Rosine Door Precaution in place.  Rn will continue to monitor pt.,

## 2021-03-21 NOTE — Procedures (Signed)
HISTORY The patient is a 55 year old woman with a history of migraines who is being evaluated for episodes of loss of consciousness with unilateral weakness.   TECHNICAL This study was performed according to the 10-20 International System of Electrode Placement. This record was reviewed under the following settings: bandpass filters of 1-70 Hz, sensitivity of 7 uV/mm, a display speed of 30 mm/sec, with a 60 Hz notched filter applied as appropriate.   MEDICATIONS Keppra    FINDINGS This study was recorded in the awake and drowsy states. A posterior dominant background rhythm of 8-9 Hz. No focal, lateralizating, or epileptiform discharges are seen.   HYPERVENTILATION: Not performed PHOTIC STIMULATION: Not performed   IMPRESSION This is a normal study recorded in the awake and drowsy states. No focal, lateralizating, or epileptiform discharges are seen.

## 2021-03-21 NOTE — Evaluation (Signed)
Speech Language Pathology Evaluation Patient Details Name: Ashley [name] MRN: [id number] DOB: Jul 09, 1966 Today's Date: 03/21/2021 Time: 7893-8101 SLP Time Calculation (min) (ACUTE ONLY): 18 min  Problem List:  Patient Active Problem List   Diagnosis Date Noted  . Stroke (cerebrum) ([place]) 03/20/2021  . Autonomic dysfunction 08/22/2020  . Paroxysmal SVT (supraventricular tachycardia) ([place]) 07/25/2020  . Menopausal vaginal dryness 07/25/2020  . Hot flashes, menopausal 11/10/2019  . Bilateral hearing loss 11/10/2019  . Dietary iron deficiency 09/01/2019  . Lumbar radiculitis 02/10/2019  . Gastroesophageal reflux disease with esophagitis 02/10/2019  . Essential hypertension 10/20/2018  . Chronic migraine without aura, with intractable migraine, so stated, with status migrainosus 07/02/2018  . Thiamine deficiency 01/05/2018  . Constipation 09/12/2017  . Moderate episode of recurrent major depressive disorder ([place]) 05/16/2017  . Diabetes mellitus with complication ([place])   . 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 Tobacco abuse 01/08/2017  . Chronic bronchitis ([place]) 10/20/2015  . Dyslipidemia, goal LDL below 70 04/12/2015  . Migraine with aura, intractable 08/12/2014  . Routine general medical examination at a health care facility 05/24/2011  . Anxiety state 09/30/2009   Past Medical History:  Past Medical History:  Diagnosis Date  . Anxiety   . Atypical ductal hyperplasia of left breast   . Bronchitis    uses inhaler if needed for bronchitis, lasted used -2014  . Cataract    bilateral  . Chronic headaches    migraines - in past, uses Phenergan for nausea   . COPD (chronic obstructive pulmonary disease) ([place])   . Deficiency anemia 12/30/2017  . Depression   . Diabetes mellitus without complication ([place])    taken off Metformin since 04/2014, HgbA1C - normal, will follow up with PCP- Dr. Deborra Medina, 07/2014  . GERD (gastroesophageal reflux disease)   . H/O exercise stress test 2011   done at  [facility]- told that it was WNL, done due to pt. having panic attacks   . History of blood transfusion [birth year]   at birth in [place], [place], unsure number of units  . PONV (postoperative nausea and vomiting)   . Poor dentition    very poor oral health   . SVD (spontaneous vaginal delivery)    x 3  . Tobacco abuse   . Wears glasses    Past Surgical History:  Past Surgical History:  Procedure Laterality Date  . ABDOMINAL HYSTERECTOMY    . APPENDECTOMY  1988  . BREAST LUMPECTOMY WITH RADIOACTIVE SEED LOCALIZATION Left 03/17/2020   Procedure: LEFT BREAST LUMPECTOMY WITH RADIOACTIVE SEED LOCALIZATION;  Surgeon: Coralie Keens, MD;  Location: [facility];  Service: General;  Laterality: Left;  LMA  . CARDIAC CATHETERIZATION N/A 08/17/2016   Procedure: Left Heart Cath and Coronary Angiography;  Surgeon: Burnell Blanks, MD;  Location: [place] [facility];  Service: Cardiovascular;  Laterality: N/A;  . CHOLECYSTECTOMY N/A 06/07/2014   Procedure: LAPAROSCOPIC CHOLECYSTECTOMY;  Surgeon: Ralene Ok, MD;  Location: [place];  Service: General;  Laterality: N/A;  . CHOLECYSTECTOMY  June 07 2014  . COLONOSCOPY  02/16/2014   normal   . ESOPHAGOGASTRODUODENOSCOPY  12/28/2013  . KNEE ARTHROSCOPY  1995   left  . LAPAROSCOPIC ASSISTED VAGINAL HYSTERECTOMY N/A 04/15/2014   Procedure: LAPAROSCOPIC ASSISTED VAGINAL HYSTERECTOMY;  Surgeon: Marylynn Pearson, MD;  Location: [place];  Service: Gynecology;  Laterality: N/A;  . LAPAROSCOPIC BILATERAL SALPINGO OOPHERECTOMY Bilateral 04/15/2014   Procedure: LAPAROSCOPIC BILATERAL SALPINGO OOPHORECTOMY;  Surgeon: Marylynn Pearson, MD;  Location: [place] [facility];  Service: Gynecology;  Laterality:  Bilateral;  . TUBAL LIGATION     HPI:  55 yo female [facility] on 3/[facility] for fall, L sided weakness, +head trauma. CTH negative for hemorrhage, MR C spine negative for acute findings. TPA administered 1533 on 3/28, 3 hours post-administration pt developing inability to urinate  and RLE weakness. MRI negative for acute infarct, CTA shows 50% R ICA stenosis. PMH includes seizure disorder, DM, MDD, hypertension, GERD, SVT, dyslipidemia, anxiety, and COPD.   Assessment / Plan / Recommendation Clinical Impression  Pt would benefit from continued ST for intervention in the areas of memory, processing and executive functions scoring in the mild impairment category of the SLUMS. She is currently enrolled in online classes for accounting and has experienced challenges this past year due to her seizures. She reports "failing three classes because I can't think like I used to and utilizes the audio feature so she cah hear her material as well as ready. Pt plans to continue her classes. Recommend ST in acute care and home health ST.    SLP Assessment  SLP Recommendation/Assessment: Patient needs continued Speech Lanaguage Pathology Services SLP Visit Diagnosis: Cognitive communication deficit (R41.841)    Follow Up Recommendations  Home health SLP    Frequency and Duration min 2x/week  2 weeks      SLP Evaluation Cognition  Overall Cognitive Status: Impaired/Different from baseline Arousal/Alertness: Awake/alert Orientation Level: Oriented X4 Attention: Sustained Sustained Attention: Appears intact Memory: Impaired Memory Impairment: Retrieval deficit Awareness: Appears intact Problem Solving: Impaired Problem Solving Impairment: Verbal complex Safety/Judgment: Appears intact       Comprehension  Auditory Comprehension Overall Auditory Comprehension: Appears within functional limits for tasks assessed Visual Recognition/Discrimination Discrimination: Not tested Reading Comprehension Reading Status: Not tested    Expression Expression Primary Mode of Expression: Verbal Verbal Expression Overall Verbal Expression: Appears within functional limits for tasks assessed Pragmatics: No impairment Written Expression Dominant Hand: Right Written Expression: Not  tested   Oral / Motor  Oral Motor/Sensory Function Overall Oral Motor/Sensory Function: Within functional limits Motor Speech Overall Motor Speech: Appears within functional limits for tasks assessed Intelligibility: Intelligible   GO                    Houston Siren 03/21/2021, 12:05 PM Orbie Pyo Colvin Caroli.Ed Risk analyst [phone] Office [phone]

## 2021-03-21 NOTE — Progress Notes (Signed)
EEG completed, results pending. 

## 2021-03-21 NOTE — Progress Notes (Addendum)
Patient husband motioned RN to room from hallway, husband stating she's having a seizure. This RN noticed side-to-side head motion, pt not verbal at this time, EEG button pushed. Within 30 seconds head movement stops, patient alert and oriented x4. No vital sign deviation noted. MD Mike Craze notified. Report given to [facility] RN.

## 2021-03-22 ENCOUNTER — Other Ambulatory Visit: Payer: Self-pay | Admitting: Internal Medicine

## 2021-03-22 DIAGNOSIS — W19XXXS Unspecified fall, sequela: Secondary | ICD-10-CM

## 2021-03-22 DIAGNOSIS — F445 Conversion disorder with seizures or convulsions: Principal | ICD-10-CM

## 2021-03-22 DIAGNOSIS — I959 Hypotension, unspecified: Secondary | ICD-10-CM

## 2021-03-22 DIAGNOSIS — E118 Type 2 diabetes mellitus with unspecified complications: Secondary | ICD-10-CM

## 2021-03-22 DIAGNOSIS — E876 Hypokalemia: Secondary | ICD-10-CM

## 2021-03-22 DIAGNOSIS — I10 Essential (primary) hypertension: Secondary | ICD-10-CM

## 2021-03-22 DIAGNOSIS — F411 Generalized anxiety disorder: Secondary | ICD-10-CM

## 2021-03-22 LAB — GLUCOSE, CAPILLARY
Glucose-Capillary: 154 mg/dL — ABNORMAL HIGH (ref 70–99)
Glucose-Capillary: 150 mg/dL — ABNORMAL HIGH (ref 70–99)
Glucose-Capillary: 178 mg/dL — ABNORMAL HIGH (ref 70–99)

## 2021-03-22 MED ORDER — IRBESARTAN 150 MG PO TABS: 150.0000 mg | ORAL_TABLET | Freq: Every day | ORAL | 0 refills | Status: DC

## 2021-03-22 MED ORDER — POTASSIUM CHLORIDE CRYS ER 20 MEQ PO TBCR: 20.0000 meq | EXTENDED_RELEASE_TABLET | Freq: Two times a day (BID) | ORAL | 0 refills | Status: DC

## 2021-03-22 NOTE — Discharge Instructions (Signed)
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    Nonepileptic events (also called nonepilepsy seizures) are not caused by electrical activity in the brain. .    One in six people also have epilepsy seizures or has had them in the past. .    Nonepilepsy seizures may be associated with psychological conditions or other physical problems.  What are they? Events that look like seizures but are not due to epilepsy are called "nonepileptic seizures." Some people prefer to use the term "events" rather than seizures. You will see the terms used interchangeably here. A common type is described as psychogenic ([id number]), which means beginning in the mind. Psychogenic seizures or events are caused by subconsious thoughts, emotions or 'stress', not abnormal electrical activity in the brain. Doctors consider most of them psychological in nature, but not purposely produced. Usually the person is not aware that the spells are not "epileptic." The term "pseudoseizures" has also been used in the past to refer to these events, but we prefer to avoid this term as it is not accurate and has a negative meaning. It's important to know that some seizures that are not epilepsy could be caused by other physical problems. These are nonepilepsy seizures too, but not caused by a psychological condition. Further testing is needed to find the exact cause so they can be treated properly.  Are they common? Psychogenic nonepileptic events are common. About 20% of the patients referred to comprehensive epilepsy centers for video-EEG monitoring are found to have nonepileptic seizures. About 1 in 6 of these patients also has epileptic seizures or has had them in the past. Psychogenic nonepileptic events have been more widely recognized during the past several decades. They are most often seen in adolescents and young adults, but they also can occur in children and the elderly. They are three times  more common in females.  What do they look like? The events most often look like complex partial or tonic-clonic (grand mal) seizures. Family members report episodes in which the patient stiffens and jerks. Doctors rarely witness the actual event, so they are drawn toward the diagnosis of epilepsy. Often years can be spent trying to treat the spells as epileptic seizures without success.  How are they recognized? Certain kinds of movements and other patterns seem to be more common in psychogenic nonepileptic events than in seizures caused by epilepsy. Some of these patterns do occur occasionally in epileptic seizures, however, so having one of them does not necessarily mean that the seizure was nonepileptic. Video-EEG monitoring is the best way of diagnosing nonepileptic seizures. The doctor may take steps to provoke a seizure and then ask a family member or friend to confirm that the event was the same as the usual kind.  Can they be treated? The good news is that psychogenic nonepileptic events can respond to treatment. A psychiatric evaluation helps sort out possible psychological problems and the types of treatment that may be needed. Being diagnosed with psychogenic seizures doesn't necessarily mean that a person has a serious psychiatric disorder. Yet treating the nonepilepsy or psychogenic seizures will involve treating whatever psychological problems may be present. .    Sometimes the episodes stop when the person learns that they are psychological. Learning what the diagnsis means and what it doesn't mean is very important. .    Some people can learn how to control the events with behavioral techniques such as relaxation therapy or other forms of cognitive behavioral  therapy. .    Some people have depression or anxiety disorders that can be helped by medication. .    Counseling for a limited time is often helpful. Both individual and family therapy may be recommended. Marland Kitchen    People who have both  epilepsy and psychogenic nonepilepsy seizures or events will require seizure medication as well as treatments for the psychogenic events. Authored by: Leo Grosser, MD Reviewed by: Craig Guess. Charlean Merl, RN, [place]  Lockie Pares, MD on 12/2011

## 2021-03-22 NOTE — Plan of Care (Signed)
  Problem: Education: Goal: Knowledge of disease or condition will improve Outcome: Adequate for Discharge Goal: Knowledge of secondary prevention will improve Outcome: Adequate for Discharge Goal: Knowledge of patient specific risk factors addressed and post discharge goals established will improve Outcome: Adequate for Discharge Goal: Individualized Educational Video(s) Outcome: Adequate for Discharge   Problem: Coping: Goal: Will verbalize positive feelings about self Outcome: Adequate for Discharge Goal: Will identify appropriate support needs Outcome: Adequate for Discharge   Problem: Health Behavior/Discharge Planning: Goal: Ability to manage health-related needs will improve Outcome: Adequate for Discharge   Problem: Self-Care: Goal: Ability to participate in self-care as condition permits will improve Outcome: Adequate for Discharge Goal: Verbalization of feelings and concerns over difficulty with self-care will improve Outcome: Adequate for Discharge Goal: Ability to communicate needs accurately will improve Outcome: Adequate for Discharge   Problem: Nutrition: Goal: Risk of aspiration will decrease Outcome: Adequate for Discharge   Problem: Ischemic Stroke/TIA Tissue Perfusion: Goal: Complications of ischemic stroke/TIA will be minimized Outcome: Adequate for Discharge

## 2021-03-22 NOTE — Progress Notes (Signed)
Pt and husband requesting prescription for Levimir pen needles before leaving for home. Stated that previous prescription was not a "match" for the pens they have, and they have been buying OTC needles at [facility]. Would like the "correct" prescription at this time. Notifying MD, will continue to monitor.

## 2021-03-22 NOTE — TOC Transition Note (Signed)
Transition of Care [facility]) - CM/SW Discharge Note   Patient Details  Name: Ashley [name] MRN: [id number] Date of Birth: [birth year]  Transition of Care [facility] CM/SW Contact:  Pollie Friar, RN Phone Number: 03/22/2021, 12:12 PM   Clinical Narrative:    Patient is discharging home with home health services through [facility]. Malachy Mood with [facility] will contact the patient for the first home visit. 3 in 1 and rollator ordered through [place] and will be delivered to the room. Pt has support at home and transportation to home.    Final next level of care: Home w Home Health Services Barriers to Discharge: No Barriers Identified   Patient Goals and CMS Choice   CMS Medicare.gov Compare Post Acute Care list provided to:: Patient Choice offered to / list presented to : [facility]  Discharge Placement                       Discharge Plan and Services                DME Arranged: 3-N-1,Walker rolling with seat DME Agency: [employer] Date DME Agency Contacted: 03/22/21   Representative spoke with at DME Agency: Freda Munro [facility] Arranged: PT,OT,Speech Therapy [place]: [place] Date [place]: 03/22/21   Representative spoke with at [place]: [place] ([place]) Interventions     Readmission Risk Interventions No flowsheet data found.

## 2021-03-22 NOTE — Progress Notes (Signed)
Pt stable for discharge per MD order. Removed IV and tele. Reviewed discharge instructions with patient and husband, all questions answered. Belongings: dress, shoes, cell phone, purse. Pt transported by volunteer.

## 2021-03-22 NOTE — Discharge Summary (Signed)
Discharge summary   Date of service: March 22, 2021 Patient Name: Ashley [name] MRN:  [id number] DOB:  1966-02-25  Brief HPI  Ashley [name] is a 55 y.o. female with PMH significant for with history of ?seizure, T2DM, GERD, anxiety, migraines, HTN, HLD. presenting with left sided weakness and numbness in her arm and leg with dysarthria. Given tPA  After CTH negative. MRI Brain was negative for stroke.  Endorses episodes at home with LOC concerning for seizures going on for a while now. Episodes involve passing out or shaking followed by usually R sided weakness, has had left sided weakness too. Takes a minute or 2 to come back.  Yesterday evening, had an episode noted by husband and documented by RN as side-to-side head motion, pt not verbal at this time, EEG button pushed. Within 30 seconds head movement stops, patient alert and oriented x4. LTM was negative for a seizure.  Interval Hx   Non epileptic event last evening. No further episodes. Discussed with patient and her husband at bedside.  Vitals   Vitals:   03/21/21 1915 03/21/21 2000 03/21/21 2244 03/22/21 0351  BP: 136/62 (!) 141/67 (!) 151/69 130/71  Pulse: 71 69 (!) 59 67  Resp: 20  18 19   Temp:  98.3 F (36.8 C) 98.1 F (36.7 C) 98.6 F (37 C)  TempSrc:  Oral Oral   SpO2: 95% 93% 99% 93%  Weight:   76 kg   Height:   5\' 2"  (1.575 m)      Body mass index is 30.65 kg/m.  Physical Exam   General: Laying comfortably in bed; in no acute distress.  HENT: Normal oropharynx and mucosa. Normal external appearance of ears and nose.  Neck: Supple, no pain or tenderness  CV: No JVD. No peripheral edema.  Pulmonary: Symmetric Chest rise. Normal respiratory effort.  Abdomen: Soft to touch, non-tender.  Ext: No cyanosis, edema, or deformity  Skin: No rash. Normal palpation of skin.   Musculoskeletal: Normal digits and nails by inspection. No clubbing.   Neurologic Examination  Mental status/Cognition: Alert, oriented to self,  place, month and year, good attention.  Speech/language: Fluent, comprehension intact, object naming intact, repetition intact.  Cranial nerves:   CN II Pupils equal and reactive to light, no VF deficits    CN III,IV,VI EOM intact, no gaze preference or deviation, no nystagmus    CN V normal sensation in V1, V2, and V3 segments bilaterally    CN VII no asymmetry, no nasolabial fold flattening    CN VIII normal hearing to speech    CN IX & X normal palatal elevation, no uvular deviation    CN XI 5/5 head turn and 5/5 shoulder shrug bilaterally    CN XII midline tongue protrusion    Motor:  Muscle bulk: poor, tone normal, pronator drift LUE drift.  Poor effort on motor exam with immediate give away weakness.  Mvmt Root Nerve  Muscle Right Left Comments  SA C5/6 Ax Deltoid 4+ 4   EF C5/6 Mc Biceps 4+ 4   EE C6/7/8 Rad Triceps 4+ 4   WF C6/7 Med FCR     WE C7/8 PIN ECU     F Ab C8/T1 U ADM/FDI 4+ 4   HF L1/2/3 Fem Illopsoas 4 4   KE L2/3/4 Fem Quad     DF L4/5 D Peron Tib Ant 4 4   PF S1/2 Tibial Grc/Sol 4 4    Reflexes:  Right Left Comments  Pectoralis      Biceps (C5/6) 2 2   Brachioradialis (C5/6) 2 2    Triceps (C6/7) 2 2    Patellar (L3/4) 2 2    Achilles (S1)      Hoffman      Plantar     Jaw jerk    Sensation:  Light touch Decreased in L face, arm and leg.   Pin prick    Temperature    Vibration   Proprioception    Coordination/Complex Motor:  - Finger to Nose intact BL - Heel to shin intact BL  Labs   Basic Metabolic Panel:  Lab Results  Component Value Date   NA 142 03/20/2021   K 3.1 (L) 03/20/2021   CO2 28 03/20/2021   GLUCOSE 98 03/20/2021   BUN 6 03/20/2021   CREATININE 0.80 03/20/2021   CALCIUM 8.9 03/20/2021   GFRNONAA >60 03/20/2021   GFRAA 105 10/31/2020   HbA1c:  Lab Results  Component Value Date   HGBA1C 6.8 (H) 03/21/2021   LDL:  Lab Results  Component Value Date   LDLCALC 49 03/21/2021   Urine Drug Screen:     Component  Value Date/Time   LABOPIA NONE DETECTED 03/20/2021 2000   COCAINSCRNUR NONE DETECTED 03/20/2021 2000   LABBENZ NONE DETECTED 03/20/2021 2000   AMPHETMU NONE DETECTED 03/20/2021 2000   THCU NONE DETECTED 03/20/2021 2000   LABBARB NONE DETECTED 03/20/2021 2000    Alcohol Level     Component Value Date/Time   ETH <10 03/20/2021 1614   No results found for: PHENYTOIN, ZONISAMIDE, LAMOTRIGINE, LEVETIRACETA No results found for: PHENYTOIN, PHENOBARB, VALPROATE, CBMZ  Imaging and Diagnostic studies   MRI Brain without contrast: Normal brain MRI.  MRI C and T spine without contrast: IMPRESSION: 1. Normal spinal cord. 2. Mild right and moderate left C5-6 neural foraminal stenosis. No cervical spinal canal stenosis. 3. Normal thoracic spine.   Impression   Ashley [name] is a 55 y.o. female  with PMH significant for with history of ?seizure, T2DM, GERD, anxiety, migraines, HTN, HLD. presenting with left sided weakness and numbness in her arm and leg with dysarthria. Given tPA  After CTH negative. MRI Brain was negative for stroke. Her neuro exam is notable for giveaway left sided weakness along with sensory deficit but no ataxia.  We were able to capture an event on her with side-to-side head motion with no post ictal period. No EEG correlate. 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  Recommendations  Psychogenic Non epileptic events: - I event captured with no EEG correlate, therefore non epileptic (episode noted by husband and documented by RN as side-to-side head motion, pt not verbal at this time, EEG button pushed. Within 30 seconds head movement stops, patient alert and oriented x4. LTM was negative for a seizure.) - I discontinued LTM - She has 72 hours ambulatory EEG scheduled outpatient on April 5th by dr. Sabra Heck. I think that would be beneficial and we should keep that on. - Amb referral to Psychology for Cognitive Behavioral Therapy. - Continue Keppra 500mg  BID - Follow up with  Dr. Sabra Heck outpatient.  Hypertension Home meds:  Irbesartan, Metoprolol Stable Currently on Irbesartan 150 mg daily Long-term BP goal normotensive  Hyperlipidemia Home meds:  Crestor 20 mg, resumed in hospital LDL 49, goal < 70 Will not change as she is at goal Continue statin at discharge  Diabetes type II Controlled Home meds:  Levemir, Dulaglutide HgbA1c 6.8, goal < 7.0 CBGs  __________________________________________________________________   Thank you for the opportunity to take part in the care of this patient. If you have any further questions, please contact the neurology consultation attending.  Signed,  [place] Pager Number [phone]

## 2021-03-22 NOTE — Procedures (Signed)
EEG Procedure CPT/Type of Study: 32023; 24hr EEG with video Referring Provider: Lorrin Goodell Primary Neurological Diagnosis: AMS, spells  History: This is a 55 yr old patient, undergoing an EEG to evaluate for shaking/unresponsive spells. Clinical State: awake and alert  Technical Description:  The EEG was performed using standard setting per the guidelines of American Clinical Neurophysiology Society (ACNS).  A minimum of 21 electrodes were placed on scalp according to the International 10-20 or/and 10-10 Systems. Supplemental electrodes were placed as needed. Single EKG electrode was also used to detect cardiac arrhythmia. Patient's behavior was continuously recorded on video simultaneously with EEG. A minimum of 16 channels were used for data display. Each epoch of study was reviewed manually daily and as needed using standard referential and bipolar montages. Computerized quantitative EEG analysis (such as compressed spectral array analysis, trending, automated spike & seizure detection) were used as indicated.   Day 1: from 1726 03/21/21 to 0730 03/22/21  EEG Description: Overall Amplitude:Normal Predominant Frequency: The background activity showed posterior dominant alpha, with about 10 Hz, that was frequent. Superimposed Frequencies: occasional theta and some beta activity bilaterally The background was symmetric  Background Abnormalities: None Rhythmic or periodic pattern: No Epileptiform activity: no Electrographic seizures: no Events: YES; one event of unresponsiveness with side to side head and body shaking without EEG correlate, consistent with pseudoseizure  Breach rhythm: no  Reactivity: Present  Stimulation procedures:  Hyperventilation: not done Photic stimulation: not done  Sleep Background: Stage II  EKG:no significant arrhythmia  Impression: This was a normal continuous video EEG with no epileptiform discharges or lateralizing signs. One event of shaking and  unresponsiveness was captured without EEG correlate, consistent with a pseudoseizure.

## 2021-03-22 NOTE — Plan of Care (Signed)
  Problem: Education: Goal: Knowledge of patient specific risk factors addressed and post discharge goals established will improve Outcome: Progressing   Problem: Education: Goal: Knowledge of secondary prevention will improve Outcome: Progressing   Problem: Education: Goal: Knowledge of disease or condition will improve Outcome: Progressing   Problem: Education: Goal: Individualized Educational Video(s) Outcome: Progressing

## 2021-03-22 NOTE — Progress Notes (Signed)
Physical Therapy Treatment Patient Details Name: Ashley [name] MRN: [id number] DOB: 1966-11-18 Today's Date: 03/22/2021    History of Present Illness 55 yo female admitted to [facility] on 3/28 for fall, L sided weakness, +head trauma. CTH negative for hemorrhage, MR C spine negative for acute findings. TPA administered 1533 on 3/28, 3 hours post-administration pt developing inability to urinate and RLE weakness. MRI negative for acute infarct, CTA shows 50% R ICA stenosis. EEG negative. PMH includes seizure disorder, DM, MDD, hypertension, GERD, SVT, dyslipidemia, anxiety, and COPD.    PT Comments    Pt is resting in bed upon arrival to room, agreeable to mobility and pt expressing interest in rollator use. Pt requires close guard for safety and balance during transfers and gait, but no physical assist provided at any point during session. Pt demonstrates unsteadiness when PT challenges balance during gait with DGI items, and scores 12/24 demonstrating high fall risk. PT educated pt on fall risk and importance of supervision for mobility, correct use of rollator including locking/unlocking, and finding place to sit if she becomes lightheaded. Pt with x1 bout of lightheadedness during gait, VSS during event and pt recovered. No focal neurologic deficits noted during session. PT continuing to recommend HHPT.   Sitting EOB: 153/76, 91 bpm  During episode of lightheadedness: 145/78, 83 bpm, SpO2 98% on RA   Follow Up Recommendations  Home health PT;Supervision for mobility/OOB     Equipment Recommendations  None recommended by PT    Recommendations for Other Services       Precautions / Restrictions Precautions Precautions: Fall Restrictions Weight Bearing Restrictions: No    Mobility  Bed Mobility Overal bed mobility: Needs Assistance Bed Mobility: Supine to Sit;Sit to Supine     Supine to sit: Supervision Sit to supine: Supervision   General bed mobility comments: for safety, increased  time and effort.    Transfers Overall transfer level: Needs assistance Equipment used: 4-wheeled walker Transfers: Sit to/from Stand Sit to Stand: Min guard         General transfer comment: close guard for safety, VC for hand placement when rising/sitting and locking/unlocking rollator during mobility. STS x2, from EOB and rollator.  Ambulation/Gait Ambulation/Gait assistance: Min guard Gait Distance (Feet): 170 Feet Assistive device: 4-wheeled walker Gait Pattern/deviations: Step-through pattern;Decreased stride length;Trunk flexed Gait velocity: decr   General Gait Details: min guard for safety, mild weaving of gait and mild unsteadiness noted but no overt LOB. Verbal cuing for rollator use, maintaining straight trajectory.   Stairs Stairs: Yes Stairs assistance: Min guard Stair Management: Two rails;Step to pattern;Forwards Number of Stairs: 2 General stair comments: min guard for safety, using step-to pattern and increased time. Verbal cuing for sequencing.   Wheelchair Mobility    Modified Rankin (Stroke Patients Only) Modified Rankin (Stroke Patients Only) Pre-Morbid Rankin Score: Moderately severe disability Modified Rankin: Moderately severe disability     Balance Overall balance assessment: Needs assistance Sitting-balance support: No upper extremity supported;Feet supported Sitting balance-Leahy Scale: Fair     Standing balance support: Bilateral upper extremity supported;During functional activity Standing balance-Leahy Scale: Poor Standing balance comment: Reliant on UE support                 Standardized Balance Assessment Standardized Balance Assessment : Dynamic Gait Index   Dynamic Gait Index Level Surface: Mild Impairment Change in Gait Speed: Moderate Impairment Gait with Horizontal Head Turns: Mild Impairment Gait with Vertical Head Turns: Mild Impairment Gait and Pivot Turn: Moderate Impairment Step  Over Obstacle: Moderate  Impairment Step Around Obstacles: Mild Impairment Steps: Moderate Impairment Total Score: 12      Cognition Arousal/Alertness: Awake/alert Behavior During Therapy: Flat affect Overall Cognitive Status: Impaired/Different from baseline Area of Impairment: Safety/judgement;Following commands;Problem solving                       Following Commands: Follows one step commands with increased time Safety/Judgement: Decreased awareness of safety   Problem Solving: Slow processing;Requires verbal cues General Comments: increased processing time, follows commands consistently. Poor safety awareness with AD use, requires cues for safety and improved throughout session with repeated cuing.      Exercises      General Comments        Pertinent Vitals/Pain Pain Assessment: No/denies pain Pain Intervention(s): Limited activity within patient's tolerance;Monitored during session    Home Living                      Prior Function            PT Goals (current goals can now be found in the care plan section) Acute Rehab PT Goals Patient Stated Goal: rest PT Goal Formulation: With patient/family Time For Goal Achievement: 04/04/21 Potential to Achieve Goals: Good Progress towards PT goals: Progressing toward goals    Frequency    Min 3X/week      PT Plan Current plan remains appropriate    Co-evaluation              AM-PAC PT "6 Clicks" Mobility   Outcome Measure  Help needed turning from your back to your side while in a flat bed without using bedrails?: A Little Help needed moving from lying on your back to sitting on the side of a flat bed without using bedrails?: A Little Help needed moving to and from a bed to a chair (including a wheelchair)?: A Little Help needed standing up from a chair using your arms (e.g., wheelchair or bedside chair)?: A Little Help needed to walk in hospital room?: A Little Help needed climbing 3-5 steps with a  railing? : A Little 6 Click Score: 18    End of Session Equipment Utilized During Treatment: Gait belt Activity Tolerance: Patient tolerated treatment well Patient left: in bed;with call bell/phone within reach;with bed alarm set;with family/visitor present Nurse Communication: Mobility status PT Visit Diagnosis: Other abnormalities of gait and mobility (R26.89);Difficulty in walking, not elsewhere classified (R26.2)     Time: 4268-3419 PT Time Calculation (min) (ACUTE ONLY): 28 min  Charges:  $Gait Training: 8-22 mins $Neuromuscular Re-education: 8-22 mins                     Stacie Glaze, PT Acute Rehabilitation Services Pager [phone]  Office [phone]   Louis Matte 03/22/2021, 12:27 PM

## 2021-03-22 NOTE — Progress Notes (Signed)
vLTM EEG complete. No skin breakdown 

## 2021-03-27 MED ORDER — FLUDROCORTISONE ACETATE 0.1 MG PO TABS: 0.1000 mg | ORAL_TABLET | Freq: Every day | ORAL | 1 refills | Status: DC

## 2021-03-27 MED ORDER — CLONAZEPAM 1 MG PO TABS
1.0000 mg | ORAL_TABLET | Freq: Two times a day (BID) | ORAL | 5 refills | Status: DC | PRN
Start: 2021-03-27 — End: 2021-05-04

## 2021-04-03 ENCOUNTER — Telehealth: Payer: Self-pay | Admitting: Internal Medicine

## 2021-04-03 NOTE — Telephone Encounter (Signed)
   Patient requesting new Ophthalmology referral. Do not refer to [facility]. Patient states she attempted to schedule, office declined to make appt with her due to past history

## 2021-04-04 ENCOUNTER — Other Ambulatory Visit: Payer: Self-pay | Admitting: Internal Medicine

## 2021-04-04 DIAGNOSIS — F068 Other specified mental disorders due to known physiological condition: Secondary | ICD-10-CM | POA: Insufficient documentation

## 2021-04-04 DIAGNOSIS — E611 Iron deficiency: Secondary | ICD-10-CM

## 2021-04-04 DIAGNOSIS — R55 Syncope and collapse: Secondary | ICD-10-CM | POA: Insufficient documentation

## 2021-04-04 DIAGNOSIS — R296 Repeated falls: Secondary | ICD-10-CM | POA: Insufficient documentation

## 2021-04-04 DIAGNOSIS — R569 Unspecified convulsions: Secondary | ICD-10-CM | POA: Insufficient documentation

## 2021-04-04 DIAGNOSIS — F458 Other somatoform disorders: Secondary | ICD-10-CM | POA: Insufficient documentation

## 2021-04-05 ENCOUNTER — Telehealth: Payer: Self-pay | Admitting: Internal Medicine

## 2021-04-05 DIAGNOSIS — G609 Hereditary and idiopathic neuropathy, unspecified: Secondary | ICD-10-CM | POA: Insufficient documentation

## 2021-04-05 NOTE — Telephone Encounter (Signed)
   [place] Name: [facility] Agency Name: [facility] Callback Phone #: [phone] Service Requested: PT Frequency of Visits: 2W2, 1W4, EOW2

## 2021-04-06 ENCOUNTER — Telehealth: Payer: Self-pay | Admitting: Internal Medicine

## 2021-04-06 NOTE — Telephone Encounter (Signed)
Ok given to push eval back.

## 2021-04-06 NOTE — Telephone Encounter (Signed)
Santiago Glad w/ [facility] is requesting verbals to push speech therapy eval to next week. They are waiting on the patients insurance. Please Tory Emerald to LVM: [phone]

## 2021-04-06 NOTE — Telephone Encounter (Signed)
Verbal orders given via VM 

## 2021-04-10 ENCOUNTER — Other Ambulatory Visit: Payer: Self-pay | Admitting: Internal Medicine

## 2021-04-10 DIAGNOSIS — E876 Hypokalemia: Secondary | ICD-10-CM

## 2021-04-10 DIAGNOSIS — I10 Essential (primary) hypertension: Secondary | ICD-10-CM

## 2021-04-10 DIAGNOSIS — E118 Type 2 diabetes mellitus with unspecified complications: Secondary | ICD-10-CM

## 2021-04-10 MED ORDER — IRBESARTAN 150 MG PO TABS: 150.0000 mg | ORAL_TABLET | Freq: Every day | ORAL | 0 refills | Status: DC

## 2021-04-10 MED ORDER — POTASSIUM CHLORIDE CRYS ER 20 MEQ PO TBCR: 20.0000 meq | EXTENDED_RELEASE_TABLET | Freq: Two times a day (BID) | ORAL | 0 refills | Status: DC

## 2021-04-11 ENCOUNTER — Telehealth: Payer: Self-pay | Admitting: Internal Medicine

## 2021-04-11 DIAGNOSIS — H93293 Other abnormal auditory perceptions, bilateral: Secondary | ICD-10-CM | POA: Insufficient documentation

## 2021-04-11 NOTE — Telephone Encounter (Signed)
Please advise 

## 2021-04-11 NOTE — Telephone Encounter (Signed)
Mel Almond from [facility] has called in regards to the patient having a fall last Thursday off the toilet. Patient hit her right side and the right side of her head. There was a knot on her head but no headache. The patient refused to go to the ED that day because she did not want to be there late.  They are not sure if the patient passed out and then fell or fell and then lost consciousness.  Please advise the patient on whether she should be seen or still should see ED.  Mel [phone]

## 2021-04-12 ENCOUNTER — Telehealth: Payer: Self-pay | Admitting: Internal Medicine

## 2021-04-12 DIAGNOSIS — E118 Type 2 diabetes mellitus with unspecified complications: Secondary | ICD-10-CM

## 2021-04-12 DIAGNOSIS — R41841 Cognitive communication deficit: Secondary | ICD-10-CM | POA: Insufficient documentation

## 2021-04-12 MED ORDER — FREESTYLE LIBRE 2 SENSOR MISC: 1.0000 | Freq: Every day | 5 refills | Status: DC

## 2021-04-12 NOTE — Telephone Encounter (Signed)
   [place] Name: [place] Name: [facility] Callback Phone #: [phone] Service Requested: SPEECH Frequency of Visits: [phone]

## 2021-04-12 NOTE — Telephone Encounter (Signed)
Verbal orders given  

## 2021-04-12 NOTE — Telephone Encounter (Signed)
Patient called and was wondering if a prescription for [employer] 2 strips could be called into [id number] - [place], [place] AT [address]. Please advise.

## 2021-04-12 NOTE — Telephone Encounter (Signed)
Done

## 2021-04-13 ENCOUNTER — Other Ambulatory Visit: Payer: Self-pay

## 2021-04-13 ENCOUNTER — Ambulatory Visit (INDEPENDENT_AMBULATORY_CARE_PROVIDER_SITE_OTHER): Payer: 59 | Admitting: Internal Medicine

## 2021-04-13 ENCOUNTER — Encounter: Payer: Self-pay | Admitting: Internal Medicine

## 2021-04-13 VITALS — BP 142/82 | HR 61 | Temp 98.4°F | Ht 62.0 in | Wt 169.2 lb

## 2021-04-13 DIAGNOSIS — E876 Hypokalemia: Secondary | ICD-10-CM | POA: Diagnosis not present

## 2021-04-13 DIAGNOSIS — G43711 Chronic migraine without aura, intractable, with status migrainosus: Secondary | ICD-10-CM

## 2021-04-13 DIAGNOSIS — E118 Type 2 diabetes mellitus with unspecified complications: Secondary | ICD-10-CM

## 2021-04-13 DIAGNOSIS — I10 Essential (primary) hypertension: Secondary | ICD-10-CM | POA: Diagnosis not present

## 2021-04-13 DIAGNOSIS — S0990XA Unspecified injury of head, initial encounter: Secondary | ICD-10-CM

## 2021-04-13 LAB — GLUCOSE, RANDOM

## 2021-04-13 MED ORDER — NURTEC 75 MG PO TBDP: 75.0000 mg | ORAL_TABLET | Freq: Every day | ORAL | 6 refills | Status: DC | PRN

## 2021-04-13 NOTE — Patient Instructions (Signed)

## 2021-04-13 NOTE — Telephone Encounter (Signed)
Pt has an OV today at 4pm.

## 2021-04-13 NOTE — Progress Notes (Signed)
Subjective:  Patient ID: Ashley [name], female    DOB: 1966/02/11  Age: 55 y.o. MRN: [id number]  CC: Headache, Hypertension, and Diabetes  This visit occurred during the SARS-CoV-2 public health emergency.  Safety protocols were in place, including screening questions prior to the visit, additional usage of staff PPE, and extensive cleaning of exam room while observing appropriate contact time as indicated for disinfecting solutions.    HPI TESSAH PATCHEN presents for f/up -   She complains that she had another syncopal episode about a week ago.  She tells me that she was getting up from the toilet when she slid down and hit her right hip and her right head.  She does not think she sustained any significant injuries and did not experience loss of consciousness.  She said she has her usual headaches that are no different or no worse.  She denies neck pain, back pain, nausea, vomiting, or paresthesias.  She complains of weight gain.  Her daughter is with her today and she complains that her mother takes too many medications.  The patient is aware that during her recent admission she was diagnosed with a psychogenic seizure disorder.  Outpatient Medications Prior to Visit  Medication Sig Dispense Refill  . [name] 225 MG/1.5ML SOSY INJECT 225 MG INTO THE SKIN EVERY 30 DAYS (Patient taking differently: Inject 225 mg into the skin every 30 (thirty) days.) 1.5 mL 11  . albuterol (VENTOLIN HFA) 108 (90 Base) MCG/ACT inhaler INHALE 2 PUFFS INTO THE LUNGS EVERY 6 HOURS AS NEEDED FOR WHEEZING OR SHORTNESS OF BREATH (Patient taking differently: Inhale 2 puffs into the lungs every 6 (six) hours as needed for wheezing or shortness of breath.) 6.7 g 3  . AQUALANCE LANCETS 30G MISC   98  . aspirin EC 81 MG tablet Take 1 tablet (81 mg total) by mouth daily. (Patient taking differently: Take 81 mg by mouth at bedtime.) 90 tablet 1  . cholecalciferol (VITAMIN D3) 25 MCG (1000 UT) tablet Take 2,000 Units by mouth daily.     . clonazePAM (KLONOPIN) 1 MG tablet Take 1 tablet (1 mg total) by mouth 2 (two) times daily as needed for anxiety. 60 tablet 5  . Continuous Blood Gluc Receiver (FREESTYLE LIBRE 2 READER) DEVI 1 Act by Does not apply route daily. 2 each 5  . Continuous Blood Gluc Sensor (FREESTYLE LIBRE 2 SENSOR) MISC 1 Act by Does not apply route daily. 2 each 5  . Dulaglutide (TRULICITY) 1.5 WU/9.8JX SOPN Inject 1.5 mg into the skin once a week. (Patient taking differently: Inject 1.5 mg into the skin once a week. Monday) 6 mL 1  . estradiol (CLIMARA - DOSED IN MG/24 HR) 0.0375 mg/24hr patch APPLY 1 PATCH(0.038 MG) TOPICALLY TO THE SKIN 1 TIME A WEEK 12 patch 1  . FEROSUL 325 (65 Fe) MG tablet TAKE 1 TABLET(325 MG) BY MOUTH TWICE DAILY WITH A MEAL 180 tablet 1  . fludrocortisone (FLORINEF) 0.1 MG tablet Take 1 tablet (0.1 mg total) by mouth daily. 90 tablet 1  . Insulin Pen Needle 32G X 6 MM MISC Use daily as directed 100 each 1  . irbesartan (AVAPRO) 150 MG tablet Take 1 tablet (150 mg total) by mouth daily. 90 tablet 0  . Lancet Devices (ADJUSTABLE LANCING DEVICE) MISC Up to tid testing  98  . LEVEMIR 100 UNIT/ML injection ADMINISTER 30 UNITS UNDER THE SKIN DAILY 10 mL 1  . metoprolol tartrate (LOPRESSOR) 50 MG tablet Take 1  tablet (50 mg total) by mouth 2 (two) times daily. 180 tablet 3  . pantoprazole (PROTONIX) 40 MG tablet TAKE 1 TABLET(40 MG) BY MOUTH DAILY (Patient taking differently: Take 40 mg by mouth daily.) 90 tablet 1  . potassium chloride SA (KLOR-CON) 20 MEQ tablet Take 1 tablet (20 mEq total) by mouth 2 (two) times daily. 180 tablet 0  . promethazine (PHENERGAN) 25 MG tablet Take 25 mg by mouth as needed for nausea or vomiting.    . rosuvastatin (CRESTOR) 20 MG tablet TAKE 1 TABLET BY MOUTH EVERY DAY, NEED OFFICE VISIT FOR MORE REFILLS (Patient taking differently: Take 20 mg by mouth daily.) 90 tablet 1  . thiamine (VITAMIN B-1) 50 MG tablet Take 1 tablet (50 mg total) by mouth daily. 90 tablet  1  . traZODone (DESYREL) 150 MG tablet TAKE 1 TABLET(150 MG) BY MOUTH AT BEDTIME (Patient taking differently: Take 150 mg by mouth at bedtime.) 90 tablet 1  . venlafaxine XR (EFFEXOR-XR) 150 MG 24 hr capsule TAKE 1 CAPSULE(150 MG) BY MOUTH DAILY WITH BREAKFAST (Patient taking differently: Take 150 mg by mouth at bedtime.) 90 capsule 3  . levETIRAcetam (KEPPRA) 500 MG tablet Take 500 mg by mouth 2 (two) times daily. (Patient not taking: Reported on 04/13/2021)    . BAYER CONTOUR TEST test strip Testing for up to tid 100 each 98  . Rimegepant Sulfate (NURTEC) 75 MG TBDP Take 75 mg by mouth daily as needed. For migraines. Take as close to onset of migraine as possible. One daily maximum. (Patient not taking: Reported on 04/13/2021) 10 tablet 6   No facility-administered medications prior to visit.    ROS Review of Systems  Constitutional: Positive for unexpected weight change (wt gain). Negative for diaphoresis and fatigue.  HENT: Negative.   Eyes: Negative for visual disturbance.  Respiratory: Negative for cough, chest tightness, shortness of breath and wheezing.   Cardiovascular: Negative for chest pain, palpitations and leg swelling.  Gastrointestinal: Negative for abdominal pain, constipation, diarrhea, nausea and vomiting.  Endocrine: Negative.   Genitourinary: Negative.  Negative for difficulty urinating, dysuria and urgency.  Musculoskeletal: Negative.   Skin: Negative.   Neurological: Positive for dizziness, syncope and headaches. Negative for tremors, seizures, weakness, light-headedness and numbness.  Hematological: Negative for adenopathy. Does not bruise/bleed easily.  Psychiatric/Behavioral: Negative.     Objective:  BP (!) 142/82 (BP Location: Left Arm, Patient Position: Sitting, Cuff Size: Large)   Pulse 61   Temp 98.4 F (36.9 C) (Oral)   Ht 5\' 2"  (1.575 m)   Wt 169 lb 3.2 oz (76.7 kg)   LMP 02/12/2014   SpO2 98%   BMI 30.95 kg/m   BP Readings from Last 3 Encounters:   04/13/21 (!) 142/82  03/22/21 (!) 145/78  03/15/21 120/82    Wt Readings from Last 3 Encounters:  04/13/21 169 lb 3.2 oz (76.7 kg)  03/21/21 167 lb 8.8 oz (76 kg)  03/15/21 166 lb (75.3 kg)    Physical Exam Vitals reviewed.  HENT:     Head: Normocephalic and atraumatic. No raccoon eyes, Battle's sign, abrasion, contusion or laceration.     Jaw: There is normal jaw occlusion.     Right Ear: Hearing normal. No hemotympanum.     Left Ear: Hearing normal. No hemotympanum.     Mouth/Throat:     Mouth: Mucous membranes are moist.  Eyes:     General: No scleral icterus.    Extraocular Movements: Extraocular movements intact.  Pupils: Pupils are equal, round, and reactive to light.  Cardiovascular:     Rate and Rhythm: Normal rate and regular rhythm.     Heart sounds: No murmur heard.   Pulmonary:     Effort: Pulmonary effort is normal.     Breath sounds: No stridor. No wheezing, rhonchi or rales.  Abdominal:     General: Abdomen is flat. Bowel sounds are normal. There is no distension.     Palpations: Abdomen is soft. There is no fluid wave, hepatomegaly, splenomegaly or mass.     Tenderness: There is no abdominal tenderness.  Musculoskeletal:        General: Normal range of motion.     Cervical back: Neck supple.     Right lower leg: No edema.     Left lower leg: No edema.  Lymphadenopathy:     Cervical: No cervical adenopathy.  Skin:    General: Skin is warm and dry.     Coloration: Skin is not pale.  Neurological:     General: No focal deficit present.     Mental Status: She is alert and oriented to person, place, and time. Mental status is at baseline.     Cranial Nerves: No cranial nerve deficit.     Sensory: No sensory deficit.     Motor: No weakness.     Coordination: Coordination normal.     Gait: Gait normal.     Deep Tendon Reflexes: Reflexes normal.  Psychiatric:        Mood and Affect: Mood normal.        Behavior: Behavior normal.        Thought  Content: Thought content normal.        Judgment: Judgment normal.     Lab Results  Component Value Date   WBC 8.0 03/20/2021   HGB 13.9 03/20/2021   HCT 41.0 03/20/2021   PLT 308 03/20/2021   GLUCOSE 98 03/20/2021   CHOL 97 03/21/2021   TRIG 78 03/21/2021   HDL 32 (L) 03/21/2021   LDLDIRECT 152.1 01/19/2013   LDLCALC 49 03/21/2021   ALT 24 03/20/2021   AST 26 03/20/2021   NA 142 03/20/2021   K 3.1 (L) 03/20/2021   CL 101 03/20/2021   CREATININE 0.80 03/20/2021   BUN 6 03/20/2021   CO2 28 03/20/2021   TSH 1.670 10/31/2020   INR 1.1 03/20/2021   HGBA1C 6.8 (H) 03/21/2021   MICROALBUR <0.7 05/09/2020    CT Angio Head W or Wo Contrast  Result Date: 03/20/2021 CLINICAL DATA:  Code stroke follow-up, weakness EXAM: CT ANGIOGRAPHY HEAD AND NECK TECHNIQUE: Multidetector CT imaging of the head and neck was performed using the standard protocol during bolus administration of intravenous contrast. Multiplanar CT image reconstructions and MIPs were obtained to evaluate the vascular anatomy. Carotid stenosis measurements (when applicable) are obtained utilizing NASCET criteria, using the distal internal carotid diameter as the denominator. CONTRAST:  36mL OMNIPAQUE IOHEXOL 350 MG/ML SOLN COMPARISON:  None. FINDINGS: CTA NECK Aortic arch: Minimal plaque along the arch. Great vessel origins are patent. Right carotid system: Patent. Calcified plaque along the proximal ICA causing approximately 50% stenosis. Left carotid system: Patent. Mixed but primarily noncalcified plaque along the proximal ICA causing less than 50% stenosis. Vertebral arteries: Patent and codominant.  No stenosis. Skeleton: Mild cervical spine degenerative changes primarily at C5-C6. Other neck: Unremarkable. Upper chest: No apical lung mass. Review of the MIP images confirms the above findings CTA HEAD Anterior circulation: Intracranial  internal carotid arteries are patent. Middle and anterior cerebral arteries are patent.  Posterior circulation: Intracranial vertebral arteries, basilar artery, and posterior cerebral arteries are patent. Venous sinuses: Patent as allowed by contrast bolus timing. Review of the MIP images confirms the above findings IMPRESSION: No large vessel occlusion, hemodynamically significant stenosis, or evidence dissection. Calcified plaque at the proximal right ICA causes approximately 50% stenosis. Electronically Signed   By: Macy Mis M.D.   On: 03/20/2021 16:04   CT HEAD WO CONTRAST  Result Date: 03/21/2021 CLINICAL DATA:  24 hours status post tPA administration, history of right-sided weakness EXAM: CT HEAD WITHOUT CONTRAST TECHNIQUE: Contiguous axial images were obtained from the base of the skull through the vertex without intravenous contrast. COMPARISON:  03/20/2021, CT and MRI FINDINGS: Brain: No acute infarct or hemorrhage. Lateral ventricles and midline structures are unremarkable. No acute extra-axial fluid collections. No mass effect. Vascular: No hyperdense vessel or unexpected calcification. Skull: Normal. Negative for fracture or focal lesion. Sinuses/Orbits: No acute finding. Other: None. IMPRESSION: 1. Stable head CT, no acute process. Electronically Signed   By: Randa Ngo M.D.   On: 03/21/2021 18:05   CT Angio Neck W and/or Wo Contrast  Result Date: 03/20/2021 CLINICAL DATA:  Code stroke follow-up, weakness EXAM: CT ANGIOGRAPHY HEAD AND NECK TECHNIQUE: Multidetector CT imaging of the head and neck was performed using the standard protocol during bolus administration of intravenous contrast. Multiplanar CT image reconstructions and MIPs were obtained to evaluate the vascular anatomy. Carotid stenosis measurements (when applicable) are obtained utilizing NASCET criteria, using the distal internal carotid diameter as the denominator. CONTRAST:  14mL OMNIPAQUE IOHEXOL 350 MG/ML SOLN COMPARISON:  None. FINDINGS: CTA NECK Aortic arch: Minimal plaque along the arch. Great vessel  origins are patent. Right carotid system: Patent. Calcified plaque along the proximal ICA causing approximately 50% stenosis. Left carotid system: Patent. Mixed but primarily noncalcified plaque along the proximal ICA causing less than 50% stenosis. Vertebral arteries: Patent and codominant.  No stenosis. Skeleton: Mild cervical spine degenerative changes primarily at C5-C6. Other neck: Unremarkable. Upper chest: No apical lung mass. Review of the MIP images confirms the above findings CTA HEAD Anterior circulation: Intracranial internal carotid arteries are patent. Middle and anterior cerebral arteries are patent. Posterior circulation: Intracranial vertebral arteries, basilar artery, and posterior cerebral arteries are patent. Venous sinuses: Patent as allowed by contrast bolus timing. Review of the MIP images confirms the above findings IMPRESSION: No large vessel occlusion, hemodynamically significant stenosis, or evidence dissection. Calcified plaque at the proximal right ICA causes approximately 50% stenosis. Electronically Signed   By: Macy Mis M.D.   On: 03/20/2021 16:04   MR BRAIN WO CONTRAST  Result Date: 03/20/2021 CLINICAL DATA:  Right-sided weakness EXAM: MRI HEAD WITHOUT CONTRAST TECHNIQUE: Multiplanar, multiecho pulse sequences of the brain and surrounding structures were obtained without intravenous contrast. COMPARISON:  04/27/2020 FINDINGS: Brain: No acute infarct, mass effect or extra-axial collection. No acute or chronic hemorrhage. Normal white matter signal, parenchymal volume and CSF spaces. The midline structures are normal. Vascular: Major flow voids are preserved. Skull and upper cervical spine: Normal calvarium and skull base. Visualized upper cervical spine and soft tissues are normal. Sinuses/Orbits:No paranasal sinus fluid levels or advanced mucosal thickening. No mastoid or middle ear effusion. Normal orbits. IMPRESSION: Normal brain MRI. Electronically Signed   By: Ulyses Jarred M.D.   On: 03/20/2021 23:51   MR CERVICAL SPINE WO CONTRAST  Result Date: 03/20/2021 CLINICAL DATA:  progressive myelopathy with  right-sided weakness EXAM: MRI CERVICAL AND THORACIC SPINE WITHOUT CONTRAST TECHNIQUE: Multiplanar and multiecho pulse sequences of the cervical spine, to include the craniocervical junction and cervicothoracic junction, and the thoracic spine, were obtained without intravenous contrast. COMPARISON:  None. FINDINGS: MRI CERVICAL SPINE FINDINGS Alignment: Unchanged grade 1 retrolisthesis at C5-6. Vertebrae: No fracture, evidence of discitis, or bone lesion. Cord: Normal signal and morphology. Posterior Fossa, vertebral arteries, paraspinal tissues: Negative. Disc levels: C2-3: Unremarkable. C3-4: Unremarkable. C4-5: Unremarkable. C5-6: Disc space narrowing with small bulge and uncovertebral hypertrophy. No spinal canal stenosis. Mild right and moderate left foraminal stenosis. C6-7: Unremarkable. C7-T1: Unremarkable. MRI THORACIC SPINE FINDINGS Alignment:  Physiologic. Vertebrae: No fracture, evidence of discitis, or bone lesion. Cord:  Normal signal and morphology. Paraspinal and other soft tissues: Negative. Disc levels: This intervertebral disc spaces are normal. IMPRESSION: 1. Normal spinal cord. 2. Mild right and moderate left C5-6 neural foraminal stenosis. No cervical spinal canal stenosis. 3. Normal thoracic spine. Electronically Signed   By: Ulyses Jarred M.D.   On: 03/20/2021 23:58   MR THORACIC SPINE WO CONTRAST  Result Date: 03/20/2021 CLINICAL DATA:  progressive myelopathy with right-sided weakness EXAM: MRI CERVICAL AND THORACIC SPINE WITHOUT CONTRAST TECHNIQUE: Multiplanar and multiecho pulse sequences of the cervical spine, to include the craniocervical junction and cervicothoracic junction, and the thoracic spine, were obtained without intravenous contrast. COMPARISON:  None. FINDINGS: MRI CERVICAL SPINE FINDINGS Alignment: Unchanged grade 1 retrolisthesis at  C5-6. Vertebrae: No fracture, evidence of discitis, or bone lesion. Cord: Normal signal and morphology. Posterior Fossa, vertebral arteries, paraspinal tissues: Negative. Disc levels: C2-3: Unremarkable. C3-4: Unremarkable. C4-5: Unremarkable. C5-6: Disc space narrowing with small bulge and uncovertebral hypertrophy. No spinal canal stenosis. Mild right and moderate left foraminal stenosis. C6-7: Unremarkable. C7-T1: Unremarkable. MRI THORACIC SPINE FINDINGS Alignment:  Physiologic. Vertebrae: No fracture, evidence of discitis, or bone lesion. Cord:  Normal signal and morphology. Paraspinal and other soft tissues: Negative. Disc levels: This intervertebral disc spaces are normal. IMPRESSION: 1. Normal spinal cord. 2. Mild right and moderate left C5-6 neural foraminal stenosis. No cervical spinal canal stenosis. 3. Normal thoracic spine. Electronically Signed   By: Ulyses Jarred M.D.   On: 03/20/2021 23:58   CT C-SPINE NO CHARGE  Result Date: 03/20/2021 CLINICAL DATA:  Fall. EXAM: CT CERVICAL SPINE WITHOUT CONTRAST TECHNIQUE: Multidetector CT imaging of the cervical spine was performed without intravenous contrast. Multiplanar CT image reconstructions were also generated. Images reformatted from head and neck CTA, reported separately. COMPARISON:  No prior CT. FINDINGS: Alignment: Trace scoliotic curvature. No listhesis or traumatic subluxation. Skull base and vertebrae: No acute fracture. Vertebral body heights are maintained. The dens and skull base are intact. Soft tissues and spinal canal: No prevertebral soft tissue edema. Vascular structures assistant concurrent neck CT, reported separately. Disc levels: Mild multilevel endplate spurring. Disc space narrowing at C5-C6 with mild canal stenosis Upper chest: Only minimally included, grossly negative. Other: None. IMPRESSION: Mild degenerative change in the cervical spine without acute fracture or subluxation. Electronically Signed   By: Keith Rake M.D.    On: 03/20/2021 17:43   EEG adult  Result Date: 03/21/2021 Serita Grammes, MD     03/21/2021  5:26 PM HISTORY The patient is a 55 year old woman with a history of migraines who is being evaluated for episodes of loss of consciousness with unilateral weakness.  TECHNICAL This study was performed according to the 10-20 International System of Electrode Placement. This record was reviewed under the following settings:  bandpass filters of 1-70 Hz, sensitivity of 7 uV/mm, a display speed of 30 mm/sec, with a 60 Hz notched filter applied as appropriate.  MEDICATIONS Keppra  FINDINGS This study was recorded in the awake and drowsy states. A posterior dominant background rhythm of 8-9 Hz. No focal, lateralizating, or epileptiform discharges are seen.  HYPERVENTILATION: Not performed PHOTIC STIMULATION: Not performed  IMPRESSION This is a normal study recorded in the awake and drowsy states. No focal, lateralizating, or epileptiform discharges are seen.   ECHOCARDIOGRAM COMPLETE  Result Date: 03/21/2021    ECHOCARDIOGRAM REPORT   Patient Name:   Miosha MINAMI ARRIAGA Date of Exam: 03/21/2021 Medical Rec #:  [id number]   Height:       62.0 in Accession #:    [id number]  Weight:       166.7 lb Date of Birth:  [birth year]   BSA:          1.769 m Patient Age:    8 years    BP:           122/61 mmHg Patient Gender: F           HR:           66 bpm. Exam Location:  Inpatient Procedure: 2D Echo, Cardiac Doppler and Color Doppler Indications:    CVA  History:        Patient has prior history of Echocardiogram examinations, most                 recent 02/18/2019. COPD; Risk Factors:Diabetes, Hypertension,                 Current Smoker and Dyslipidemia.  Sonographer:    Dustin Flock Referring Phys: [id number] [place]  1. Left ventricular ejection fraction, by estimation, is 60 to 65%. The left ventricle has normal function. The left ventricle has no regional wall motion abnormalities. There is mild concentric left  ventricular hypertrophy. Left ventricular diastolic parameters are consistent with Grade I diastolic dysfunction (impaired relaxation). Elevated left ventricular end-diastolic pressure.  2. Right ventricular systolic function is normal. The right ventricular size is normal. There is normal pulmonary artery systolic pressure.  3. The mitral valve is normal in structure. Trivial mitral valve regurgitation. No evidence of mitral stenosis.  4. The aortic valve is tricuspid. Aortic valve regurgitation is not visualized. No aortic stenosis is present.  5. The inferior vena cava is normal in size with greater than 50% respiratory variability, suggesting right atrial pressure of 3 mmHg. FINDINGS  Left Ventricle: Left ventricular ejection fraction, by estimation, is 60 to 65%. The left ventricle has normal function. The left ventricle has no regional wall motion abnormalities. The left ventricular internal cavity size was normal in size. There is  mild concentric left ventricular hypertrophy. Left ventricular diastolic parameters are consistent with Grade I diastolic dysfunction (impaired relaxation). Elevated left ventricular end-diastolic pressure. Right Ventricle: The right ventricular size is normal. No increase in right ventricular wall thickness. Right ventricular systolic function is normal. There is normal pulmonary artery systolic pressure. The tricuspid regurgitant velocity is 1.19 m/s, and  with an assumed right atrial pressure of 3 mmHg, the estimated right ventricular systolic pressure is 8.7 mmHg. Left Atrium: Left atrial size was normal in size. Right Atrium: Right atrial size was normal in size. Pericardium: There is no evidence of pericardial effusion. Mitral Valve: The mitral valve is normal in structure. Trivial mitral valve regurgitation. No evidence of mitral valve stenosis. Tricuspid Valve:  The tricuspid valve is normal in structure. Tricuspid valve regurgitation is trivial. No evidence of tricuspid  stenosis. Aortic Valve: The aortic valve is tricuspid. Aortic valve regurgitation is not visualized. No aortic stenosis is present. Pulmonic Valve: The pulmonic valve was normal in structure. Pulmonic valve regurgitation is not visualized. No evidence of pulmonic stenosis. Aorta: The aortic root is normal in size and structure. Venous: The inferior vena cava is normal in size with greater than 50% respiratory variability, suggesting right atrial pressure of 3 mmHg. IAS/Shunts: No atrial level shunt detected by color flow Doppler.  LEFT VENTRICLE PLAX 2D LVIDd:         4.30 cm  Diastology LVIDs:         2.30 cm  LV e' medial:    5.87 cm/s LV PW:         1.20 cm  LV E/e' medial:  15.0 LV IVS:        1.10 cm  LV e' lateral:   8.59 cm/s LVOT diam:     1.80 cm  LV E/e' lateral: 10.2 LV SV:         39 LV SV Index:   22 LVOT Area:     2.54 cm  RIGHT VENTRICLE RV Basal diam:  2.60 cm RV S prime:     9.90 cm/s TAPSE (M-mode): 2.7 cm LEFT ATRIUM             Index       RIGHT ATRIUM           Index LA diam:        3.10 cm 1.75 cm/m  RA Area:     10.70 cm LA Vol (A2C):   29.2 ml 16.51 ml/m RA Volume:   20.20 ml  11.42 ml/m LA Vol (A4C):   40.7 ml 23.01 ml/m LA Biplane Vol: 35.2 ml 19.90 ml/m  AORTIC VALVE LVOT Vmax:   79.00 cm/s LVOT Vmean:  49.500 cm/s LVOT VTI:    0.153 m  AORTA Ao Root diam: 2.80 cm MITRAL VALVE               TRICUSPID VALVE MV Area (PHT): 3.72 cm    TR Peak grad:   5.7 mmHg MV Decel Time: 204 msec    TR Vmax:        119.00 cm/s MV E velocity: 87.80 cm/s MV A velocity: 93.00 cm/s  SHUNTS MV E/A ratio:  0.94        Systemic VTI:  0.15 m                            Systemic Diam: 1.80 cm Skeet Latch MD Electronically signed by Skeet Latch MD Signature Date/Time: 03/21/2021/4:11:48 PM    Final    CT HEAD CODE STROKE WO CONTRAST  Result Date: 03/20/2021 CLINICAL DATA:  Code stroke.  Weakness EXAM: CT HEAD WITHOUT CONTRAST TECHNIQUE: Contiguous axial images were obtained from the base of the  skull through the vertex without intravenous contrast. COMPARISON:  04/27/2020 FINDINGS: Brain: No acute intracranial hemorrhage, mass effect, or edema. Gray-white differentiation is preserved. Ventricles and sulci are normal in size and configuration. No extra-axial collection. Vascular: No hyperdense vessel. Skull: Unremarkable. Sinuses/Orbits: No acute finding. Other: Mastoid air cells are clear. ASPECTS [facility] Stroke Program Early CT Score) - Ganglionic level infarction (caudate, lentiform nuclei, internal capsule, insula, M1-M3 cortex): 7 - Supraganglionic infarction (M4-M6 cortex): 3 Total score (0-10 with 10  being normal): 10 IMPRESSION: There is no acute intracranial hemorrhage or evidence of acute infarction. ASPECT score is 10. These results were called by telephone at the time of interpretation on 03/20/2021 at 3:38 pm to provider Dr. Lorrin Goodell, who verbally acknowledged these results. Electronically Signed   By: Macy Mis M.D.   On: 03/20/2021 15:42    Assessment & Plan:   Rubina was seen today for headache, hypertension and diabetes.  Diagnoses and all orders for this visit:  Essential hypertension- Her blood pressure is not adequately well controlled.  She agrees to improve her lifestyle modifications. -     Basic metabolic panel; Future  Hypokalemia-I will monitor her potassium and magnesium levels. -     Basic metabolic panel; Future -     Magnesium; Future  Diabetes mellitus with complication ([place])- Her blood sugar is adequately well controlled. -     Glucose, random  Chronic migraine without aura, with intractable migraine, so stated, with status migrainosus -     Rimegepant Sulfate (NURTEC) 75 MG TBDP; Take 75 mg by mouth daily as needed. For migraines. Take as close to onset of migraine as possible. One daily maximum.  Injury of head, initial encounter- There are no lingering signs of head injury.  This was a minor injury with no sequelae.   I have discontinued [employer] I am also having her maintain her Adjustable Lancing Device, AquaLance Lancets 30G, Insulin Pen Needle, aspirin EC, cholecalciferol, [name], levETIRAcetam, traZODone, rosuvastatin, venlafaxine XR, pantoprazole, estradiol, albuterol, metoprolol tartrate, Trulicity, FreeStyle Libre 2 Reader, thiamine, Levemir, clonazePAM, fludrocortisone, FeroSul, irbesartan, potassium chloride SA, FreeStyle Libre 2 Sensor, promethazine, and Nurtec.  Meds ordered this encounter  Medications  . Rimegepant Sulfate (NURTEC) 75 MG TBDP    Sig: Take 75 mg by mouth daily as needed. For migraines. Take as close to onset of migraine as possible. One daily maximum.    Dispense:  10 tablet    Refill:  6    Patient has copay card; she can have medication regardless of insurance approval or copay amount.     Follow-up: Return in about 3 months (around 07/13/2021).  Scarlette Calico, MD

## 2021-04-13 NOTE — Telephone Encounter (Signed)
I can't evaluate a head injury thru a phone note

## 2021-04-14 DIAGNOSIS — S0990XA Unspecified injury of head, initial encounter: Secondary | ICD-10-CM | POA: Insufficient documentation

## 2021-04-21 ENCOUNTER — Telehealth: Payer: Self-pay | Admitting: Internal Medicine

## 2021-04-21 NOTE — Telephone Encounter (Signed)
Noted  

## 2021-04-21 NOTE — Telephone Encounter (Signed)
Ashley [name] a speech therapist with [facility] calling, states the patient has missed visit today because she has a migraine.

## 2021-04-23 ENCOUNTER — Other Ambulatory Visit: Payer: Self-pay | Admitting: Internal Medicine

## 2021-04-23 DIAGNOSIS — I10 Essential (primary) hypertension: Secondary | ICD-10-CM

## 2021-04-23 DIAGNOSIS — E118 Type 2 diabetes mellitus with unspecified complications: Secondary | ICD-10-CM

## 2021-04-24 ENCOUNTER — Other Ambulatory Visit: Payer: Self-pay | Admitting: Family

## 2021-04-24 ENCOUNTER — Other Ambulatory Visit: Payer: Self-pay | Admitting: Internal Medicine

## 2021-04-24 DIAGNOSIS — E876 Hypokalemia: Secondary | ICD-10-CM

## 2021-04-27 ENCOUNTER — Telehealth: Payer: Self-pay | Admitting: Cardiology

## 2021-04-27 DIAGNOSIS — Z006 Encounter for examination for normal comparison and control in clinical research program: Secondary | ICD-10-CM

## 2021-04-27 NOTE — Telephone Encounter (Signed)
Called patient for 90 day Identify phone call no answer, I left a voicemail stating the intent of the phone call and our call back number to be reached in our department. 

## 2021-04-28 ENCOUNTER — Telehealth: Payer: 59 | Admitting: Physician Assistant

## 2021-04-28 DIAGNOSIS — R300 Dysuria: Secondary | ICD-10-CM | POA: Diagnosis not present

## 2021-04-28 MED ORDER — NITROFURANTOIN MONOHYD MACRO 100 MG PO CAPS: 100.0000 mg | ORAL_CAPSULE | Freq: Two times a day (BID) | ORAL | 0 refills | Status: DC

## 2021-04-28 NOTE — Progress Notes (Signed)
I have spent 5 minutes in review of e-visit questionnaire, review and updating patient chart, medical decision making and response to patient.   Stevan Eberwein Cody Lakeesha Fontanilla, PA-C    

## 2021-04-28 NOTE — Progress Notes (Signed)
Message sent to patient requesting further input regarding current symptoms. Awaiting patient response.  

## 2021-04-28 NOTE — Progress Notes (Signed)

## 2021-05-03 ENCOUNTER — Telehealth: Payer: Self-pay

## 2021-05-03 DIAGNOSIS — Z006 Encounter for examination for normal comparison and control in clinical research program: Secondary | ICD-10-CM

## 2021-05-03 NOTE — Telephone Encounter (Signed)
Called patient for 90 day Identify phone call, patient stated she is still having some of the same cardiac symptoms that brought her in, she also stated that she has followed up with her doctor regarding these symptoms but no further diagnostic testing or procedures were necessary at this time. Lastly I informed her we would be calling her one once more for a year follow up call regarding a follow up in the study.

## 2021-05-04 ENCOUNTER — Other Ambulatory Visit: Payer: Self-pay

## 2021-05-04 ENCOUNTER — Telehealth: Payer: Self-pay | Admitting: Internal Medicine

## 2021-05-04 ENCOUNTER — Ambulatory Visit (INDEPENDENT_AMBULATORY_CARE_PROVIDER_SITE_OTHER): Payer: 59

## 2021-05-04 ENCOUNTER — Ambulatory Visit (INDEPENDENT_AMBULATORY_CARE_PROVIDER_SITE_OTHER): Payer: 59 | Admitting: Internal Medicine

## 2021-05-04 ENCOUNTER — Encounter: Payer: Self-pay | Admitting: Internal Medicine

## 2021-05-04 VITALS — BP 138/86 | HR 64 | Temp 98.1°F | Resp 16 | Ht 62.0 in | Wt 173.0 lb

## 2021-05-04 DIAGNOSIS — J411 Mucopurulent chronic bronchitis: Secondary | ICD-10-CM | POA: Diagnosis not present

## 2021-05-04 DIAGNOSIS — N951 Menopausal and female climacteric states: Secondary | ICD-10-CM

## 2021-05-04 DIAGNOSIS — I10 Essential (primary) hypertension: Secondary | ICD-10-CM | POA: Diagnosis not present

## 2021-05-04 DIAGNOSIS — E118 Type 2 diabetes mellitus with unspecified complications: Secondary | ICD-10-CM | POA: Diagnosis not present

## 2021-05-04 DIAGNOSIS — E876 Hypokalemia: Secondary | ICD-10-CM

## 2021-05-04 LAB — BASIC METABOLIC PANEL
BUN: 11 mg/dL (ref 6–23)
CO2: 31 mEq/L (ref 19–32)
Calcium: 9.5 mg/dL (ref 8.4–10.5)
Chloride: 102 mEq/L (ref 96–112)
Creatinine, Ser: 0.73 mg/dL (ref 0.40–1.20)
GFR: 92.66 mL/min (ref >60.00)
Glucose, Bld: 121 mg/dL — ABNORMAL HIGH (ref 70–99)
Potassium: 3.7 mEq/L (ref 3.5–5.1)
Sodium: 141 mEq/L (ref 135–145)

## 2021-05-04 LAB — MAGNESIUM: Magnesium: 2.1 mg/dL (ref 1.5–2.5)

## 2021-05-04 LAB — POCT GLYCOSYLATED HEMOGLOBIN (HGB A1C): Hemoglobin A1C: 6.6 % — AB (ref 4.0–5.6)

## 2021-05-04 MED ORDER — ESTROGENS, CONJUGATED 0.625 MG/GM VA CREA
1.0000 | TOPICAL_CREAM | Freq: Every day | VAGINAL | 1 refills | Status: DC
Start: 2021-05-04 — End: 2021-11-24

## 2021-05-04 MED ORDER — TRULICITY 3 MG/0.5ML ~~LOC~~ SOAJ: 3.0000 mg | SUBCUTANEOUS | 1 refills | Status: DC

## 2021-05-04 MED ORDER — REVEFENACIN 175 MCG/3ML IN SOLN
175.0000 ug | Freq: Every day | RESPIRATORY_TRACT | 1 refills | Status: DC
Start: 2021-05-04 — End: 2022-01-11

## 2021-05-04 MED ORDER — FORMOTEROL FUMARATE 20 MCG/2ML IN NEBU: 20.0000 ug | INHALATION_SOLUTION | Freq: Two times a day (BID) | RESPIRATORY_TRACT | 1 refills | Status: DC

## 2021-05-04 NOTE — Progress Notes (Signed)
Subjective:  Patient ID: Ashley [name], female    DOB: 11-18-1966  Age: 55 y.o. MRN: [id number]  CC: COPD, Cough, and Diabetes  This visit occurred during the SARS-CoV-2 public health emergency.  Safety protocols were in place, including screening questions prior to the visit, additional usage of staff PPE, and extensive cleaning of exam room while observing appropriate contact time as indicated for disinfecting solutions.    HPI Ashley [name] presents for f/up -  She complains of chronic cough productive of yellow phlegm.  She tells me she does not have the dexterity to use a hand-held device.  She feels like she needs a nebulizer.  She has her baseline level of shortness of breath, DOE, and wheezing.  She complains of weight gain.  She would like to know if she could stop using basal insulin. She wants to change her estrogen patch to an estrogen vaginal cream.  Outpatient Medications Prior to Visit  Medication Sig Dispense Refill  . [name] 225 MG/1.5ML SOSY INJECT 225 MG INTO THE SKIN EVERY 30 DAYS (Patient taking differently: Inject 225 mg into the skin every 30 (thirty) days.) 1.5 mL 11  . AQUALANCE LANCETS 30G MISC   98  . cholecalciferol (VITAMIN D3) 25 MCG (1000 UT) tablet Take 2,000 Units by mouth daily.    . Continuous Blood Gluc Receiver (FREESTYLE LIBRE 2 READER) DEVI 1 Act by Does not apply route daily. 2 each 5  . Continuous Blood Gluc Sensor (FREESTYLE LIBRE 2 SENSOR) MISC 1 Act by Does not apply route daily. 2 each 5  . FEROSUL 325 (65 Fe) MG tablet TAKE 1 TABLET(325 MG) BY MOUTH TWICE DAILY WITH A MEAL 180 tablet 1  . fludrocortisone (FLORINEF) 0.1 MG tablet Take 1 tablet (0.1 mg total) by mouth daily. 90 tablet 1  . Insulin Pen Needle 32G X 6 MM MISC Use daily as directed 100 each 1  . irbesartan (AVAPRO) 150 MG tablet Take 1 tablet (150 mg total) by mouth daily. 90 tablet 0  . Lancet Devices (ADJUSTABLE LANCING DEVICE) MISC Up to tid testing  98  . levETIRAcetam (KEPPRA) 500  MG tablet Take 500 mg by mouth 2 (two) times daily.    . metoprolol tartrate (LOPRESSOR) 50 MG tablet Take 1 tablet (50 mg total) by mouth 2 (two) times daily. 180 tablet 3  . pantoprazole (PROTONIX) 40 MG tablet TAKE 1 TABLET(40 MG) BY MOUTH DAILY (Patient taking differently: Take 40 mg by mouth daily.) 90 tablet 1  . potassium chloride SA (KLOR-CON) 20 MEQ tablet TAKE 1 TABLET(20 MEQ) BY MOUTH TWICE DAILY 180 tablet 0  . promethazine (PHENERGAN) 25 MG tablet Take 25 mg by mouth as needed for nausea or vomiting.    . Rimegepant Sulfate (NURTEC) 75 MG TBDP Take 75 mg by mouth daily as needed. For migraines. Take as close to onset of migraine as possible. One daily maximum. 10 tablet 6  . rosuvastatin (CRESTOR) 20 MG tablet TAKE 1 TABLET BY MOUTH EVERY DAY, NEED OFFICE VISIT FOR MORE REFILLS (Patient taking differently: Take 20 mg by mouth daily.) 90 tablet 1  . thiamine (VITAMIN B-1) 50 MG tablet Take 1 tablet (50 mg total) by mouth daily. 90 tablet 1  . traZODone (DESYREL) 150 MG tablet TAKE 1 TABLET(150 MG) BY MOUTH AT BEDTIME (Patient taking differently: Take 150 mg by mouth at bedtime.) 90 tablet 1  . venlafaxine XR (EFFEXOR-XR) 150 MG 24 hr capsule TAKE 1 CAPSULE(150 MG) BY MOUTH DAILY  WITH BREAKFAST (Patient taking differently: Take 150 mg by mouth at bedtime.) 90 capsule 3  . albuterol (VENTOLIN HFA) 108 (90 Base) MCG/ACT inhaler INHALE 2 PUFFS INTO THE LUNGS EVERY 6 HOURS AS NEEDED FOR WHEEZING OR SHORTNESS OF BREATH (Patient taking differently: Inhale 2 puffs into the lungs every 6 (six) hours as needed for wheezing or shortness of breath.) 6.7 g 3  . aspirin EC 81 MG tablet Take 1 tablet (81 mg total) by mouth daily. (Patient taking differently: Take 81 mg by mouth at bedtime.) 90 tablet 1  . clonazePAM (KLONOPIN) 1 MG tablet Take 1 tablet (1 mg total) by mouth 2 (two) times daily as needed for anxiety. 60 tablet 5  . Dulaglutide (TRULICITY) 1.5 0000000 SOPN Inject 1.5 mg into the skin once a  week. (Patient taking differently: Inject 1.5 mg into the skin once a week. Monday) 6 mL 1  . estradiol (CLIMARA - DOSED IN MG/24 HR) 0.0375 mg/24hr patch APPLY 1 PATCH(0.038 MG) TOPICALLY TO THE SKIN 1 TIME A WEEK 12 patch 1  . LEVEMIR 100 UNIT/ML injection ADMINISTER 30 UNITS UNDER THE SKIN DAILY 10 mL 1  . nitrofurantoin, macrocrystal-monohydrate, (MACROBID) 100 MG capsule Take 1 capsule (100 mg total) by mouth 2 (two) times daily. 10 capsule 0   No facility-administered medications prior to visit.    ROS Review of Systems  Constitutional: Positive for unexpected weight change (wt gain). Negative for chills, diaphoresis, fatigue and fever.  HENT: Negative.   Eyes: Negative.   Respiratory: Positive for cough, shortness of breath and wheezing. Negative for chest tightness.   Cardiovascular: Negative for chest pain, palpitations and leg swelling.  Gastrointestinal: Negative for abdominal pain, diarrhea and nausea.  Genitourinary: Negative.  Negative for decreased urine volume and vaginal bleeding.       Vaginal dryness  Musculoskeletal: Negative.  Negative for arthralgias and myalgias.  Skin: Negative.   Neurological: Negative.   Hematological: Negative for adenopathy. Does not bruise/bleed easily.  Psychiatric/Behavioral: Negative.     Objective:  BP 138/86 (BP Location: Left Arm, Patient Position: Sitting, Cuff Size: Large)   Pulse 64   Temp 98.1 F (36.7 C) (Oral)   Resp 16   Ht 5\' 2"  (1.575 m)   Wt 173 lb (78.5 kg)   LMP 02/12/2014   SpO2 96%   BMI 31.64 kg/m   BP Readings from Last 3 Encounters:  05/04/21 138/86  04/13/21 (!) 142/82  03/22/21 (!) 145/78    Wt Readings from Last 3 Encounters:  05/04/21 173 lb (78.5 kg)  04/13/21 169 lb 3.2 oz (76.7 kg)  03/21/21 167 lb 8.8 oz (76 kg)    Physical Exam Vitals reviewed.  Constitutional:      Appearance: Normal appearance. She is not ill-appearing.  HENT:     Nose: Nose normal.     Mouth/Throat:     Mouth:  Mucous membranes are moist.  Eyes:     General: No scleral icterus.    Conjunctiva/sclera: Conjunctivae normal.  Cardiovascular:     Rate and Rhythm: Normal rate and regular rhythm.     Heart sounds: No murmur heard.   Pulmonary:     Effort: Pulmonary effort is normal.     Breath sounds: No stridor. No wheezing, rhonchi or rales.  Abdominal:     General: Abdomen is flat. Bowel sounds are normal. There is no distension.     Palpations: Abdomen is soft. There is no hepatomegaly or mass.     Tenderness: There  is no abdominal tenderness.  Musculoskeletal:        General: Normal range of motion.     Cervical back: Neck supple.     Right lower leg: No edema.     Left lower leg: No edema.  Lymphadenopathy:     Cervical: No cervical adenopathy.  Skin:    General: Skin is warm and dry.     Coloration: Skin is not pale.  Neurological:     General: No focal deficit present.     Mental Status: She is alert.  Psychiatric:        Mood and Affect: Mood normal.        Behavior: Behavior normal.     Lab Results  Component Value Date   WBC 8.0 03/20/2021   HGB 13.9 03/20/2021   HCT 41.0 03/20/2021   PLT 308 03/20/2021   GLUCOSE 121 (H) 05/04/2021   CHOL 97 03/21/2021   TRIG 78 03/21/2021   HDL 32 (L) 03/21/2021   LDLDIRECT 152.1 01/19/2013   LDLCALC 49 03/21/2021   ALT 24 03/20/2021   AST 26 03/20/2021   NA 141 05/04/2021   K 3.7 05/04/2021   CL 102 05/04/2021   CREATININE 0.73 05/04/2021   BUN 11 05/04/2021   CO2 31 05/04/2021   TSH 1.670 10/31/2020   INR 1.1 03/20/2021   HGBA1C 6.6 (A) 05/04/2021   MICROALBUR <0.7 05/09/2020    CT Angio Head W or Wo Contrast  Result Date: 03/20/2021 CLINICAL DATA:  Code stroke follow-up, weakness EXAM: CT ANGIOGRAPHY HEAD AND NECK TECHNIQUE: Multidetector CT imaging of the head and neck was performed using the standard protocol during bolus administration of intravenous contrast. Multiplanar CT image reconstructions and MIPs were  obtained to evaluate the vascular anatomy. Carotid stenosis measurements (when applicable) are obtained utilizing NASCET criteria, using the distal internal carotid diameter as the denominator. CONTRAST:  58mL OMNIPAQUE IOHEXOL 350 MG/ML SOLN COMPARISON:  None. FINDINGS: CTA NECK Aortic arch: Minimal plaque along the arch. Great vessel origins are patent. Right carotid system: Patent. Calcified plaque along the proximal ICA causing approximately 50% stenosis. Left carotid system: Patent. Mixed but primarily noncalcified plaque along the proximal ICA causing less than 50% stenosis. Vertebral arteries: Patent and codominant.  No stenosis. Skeleton: Mild cervical spine degenerative changes primarily at C5-C6. Other neck: Unremarkable. Upper chest: No apical lung mass. Review of the MIP images confirms the above findings CTA HEAD Anterior circulation: Intracranial internal carotid arteries are patent. Middle and anterior cerebral arteries are patent. Posterior circulation: Intracranial vertebral arteries, basilar artery, and posterior cerebral arteries are patent. Venous sinuses: Patent as allowed by contrast bolus timing. Review of the MIP images confirms the above findings IMPRESSION: No large vessel occlusion, hemodynamically significant stenosis, or evidence dissection. Calcified plaque at the proximal right ICA causes approximately 50% stenosis. Electronically Signed   By: Macy Mis M.D.   On: 03/20/2021 16:04   CT HEAD WO CONTRAST  Result Date: 03/21/2021 CLINICAL DATA:  24 hours status post tPA administration, history of right-sided weakness EXAM: CT HEAD WITHOUT CONTRAST TECHNIQUE: Contiguous axial images were obtained from the base of the skull through the vertex without intravenous contrast. COMPARISON:  03/20/2021, CT and MRI FINDINGS: Brain: No acute infarct or hemorrhage. Lateral ventricles and midline structures are unremarkable. No acute extra-axial fluid collections. No mass effect. Vascular: No  hyperdense vessel or unexpected calcification. Skull: Normal. Negative for fracture or focal lesion. Sinuses/Orbits: No acute finding. Other: None. IMPRESSION: 1. Stable head CT,  no acute process. Electronically Signed   By: Randa Ngo M.D.   On: 03/21/2021 18:05   CT Angio Neck W and/or Wo Contrast  Result Date: 03/20/2021 CLINICAL DATA:  Code stroke follow-up, weakness EXAM: CT ANGIOGRAPHY HEAD AND NECK TECHNIQUE: Multidetector CT imaging of the head and neck was performed using the standard protocol during bolus administration of intravenous contrast. Multiplanar CT image reconstructions and MIPs were obtained to evaluate the vascular anatomy. Carotid stenosis measurements (when applicable) are obtained utilizing NASCET criteria, using the distal internal carotid diameter as the denominator. CONTRAST:  20mL OMNIPAQUE IOHEXOL 350 MG/ML SOLN COMPARISON:  None. FINDINGS: CTA NECK Aortic arch: Minimal plaque along the arch. Great vessel origins are patent. Right carotid system: Patent. Calcified plaque along the proximal ICA causing approximately 50% stenosis. Left carotid system: Patent. Mixed but primarily noncalcified plaque along the proximal ICA causing less than 50% stenosis. Vertebral arteries: Patent and codominant.  No stenosis. Skeleton: Mild cervical spine degenerative changes primarily at C5-C6. Other neck: Unremarkable. Upper chest: No apical lung mass. Review of the MIP images confirms the above findings CTA HEAD Anterior circulation: Intracranial internal carotid arteries are patent. Middle and anterior cerebral arteries are patent. Posterior circulation: Intracranial vertebral arteries, basilar artery, and posterior cerebral arteries are patent. Venous sinuses: Patent as allowed by contrast bolus timing. Review of the MIP images confirms the above findings IMPRESSION: No large vessel occlusion, hemodynamically significant stenosis, or evidence dissection. Calcified plaque at the proximal right  ICA causes approximately 50% stenosis. Electronically Signed   By: Macy Mis M.D.   On: 03/20/2021 16:04   MR BRAIN WO CONTRAST  Result Date: 03/20/2021 CLINICAL DATA:  Right-sided weakness EXAM: MRI HEAD WITHOUT CONTRAST TECHNIQUE: Multiplanar, multiecho pulse sequences of the brain and surrounding structures were obtained without intravenous contrast. COMPARISON:  04/27/2020 FINDINGS: Brain: No acute infarct, mass effect or extra-axial collection. No acute or chronic hemorrhage. Normal white matter signal, parenchymal volume and CSF spaces. The midline structures are normal. Vascular: Major flow voids are preserved. Skull and upper cervical spine: Normal calvarium and skull base. Visualized upper cervical spine and soft tissues are normal. Sinuses/Orbits:No paranasal sinus fluid levels or advanced mucosal thickening. No mastoid or middle ear effusion. Normal orbits. IMPRESSION: Normal brain MRI. Electronically Signed   By: Ulyses Jarred M.D.   On: 03/20/2021 23:51   MR CERVICAL SPINE WO CONTRAST  Result Date: 03/20/2021 CLINICAL DATA:  progressive myelopathy with right-sided weakness EXAM: MRI CERVICAL AND THORACIC SPINE WITHOUT CONTRAST TECHNIQUE: Multiplanar and multiecho pulse sequences of the cervical spine, to include the craniocervical junction and cervicothoracic junction, and the thoracic spine, were obtained without intravenous contrast. COMPARISON:  None. FINDINGS: MRI CERVICAL SPINE FINDINGS Alignment: Unchanged grade 1 retrolisthesis at C5-6. Vertebrae: No fracture, evidence of discitis, or bone lesion. Cord: Normal signal and morphology. Posterior Fossa, vertebral arteries, paraspinal tissues: Negative. Disc levels: C2-3: Unremarkable. C3-4: Unremarkable. C4-5: Unremarkable. C5-6: Disc space narrowing with small bulge and uncovertebral hypertrophy. No spinal canal stenosis. Mild right and moderate left foraminal stenosis. C6-7: Unremarkable. C7-T1: Unremarkable. MRI THORACIC SPINE  FINDINGS Alignment:  Physiologic. Vertebrae: No fracture, evidence of discitis, or bone lesion. Cord:  Normal signal and morphology. Paraspinal and other soft tissues: Negative. Disc levels: This intervertebral disc spaces are normal. IMPRESSION: 1. Normal spinal cord. 2. Mild right and moderate left C5-6 neural foraminal stenosis. No cervical spinal canal stenosis. 3. Normal thoracic spine. Electronically Signed   By: Ulyses Jarred M.D.   On: 03/20/2021 23:58  MR THORACIC SPINE WO CONTRAST  Result Date: 03/20/2021 CLINICAL DATA:  progressive myelopathy with right-sided weakness EXAM: MRI CERVICAL AND THORACIC SPINE WITHOUT CONTRAST TECHNIQUE: Multiplanar and multiecho pulse sequences of the cervical spine, to include the craniocervical junction and cervicothoracic junction, and the thoracic spine, were obtained without intravenous contrast. COMPARISON:  None. FINDINGS: MRI CERVICAL SPINE FINDINGS Alignment: Unchanged grade 1 retrolisthesis at C5-6. Vertebrae: No fracture, evidence of discitis, or bone lesion. Cord: Normal signal and morphology. Posterior Fossa, vertebral arteries, paraspinal tissues: Negative. Disc levels: C2-3: Unremarkable. C3-4: Unremarkable. C4-5: Unremarkable. C5-6: Disc space narrowing with small bulge and uncovertebral hypertrophy. No spinal canal stenosis. Mild right and moderate left foraminal stenosis. C6-7: Unremarkable. C7-T1: Unremarkable. MRI THORACIC SPINE FINDINGS Alignment:  Physiologic. Vertebrae: No fracture, evidence of discitis, or bone lesion. Cord:  Normal signal and morphology. Paraspinal and other soft tissues: Negative. Disc levels: This intervertebral disc spaces are normal. IMPRESSION: 1. Normal spinal cord. 2. Mild right and moderate left C5-6 neural foraminal stenosis. No cervical spinal canal stenosis. 3. Normal thoracic spine. Electronically Signed   By: Ulyses Jarred M.D.   On: 03/20/2021 23:58   CT C-SPINE NO CHARGE  Result Date: 03/20/2021 CLINICAL DATA:   Fall. EXAM: CT CERVICAL SPINE WITHOUT CONTRAST TECHNIQUE: Multidetector CT imaging of the cervical spine was performed without intravenous contrast. Multiplanar CT image reconstructions were also generated. Images reformatted from head and neck CTA, reported separately. COMPARISON:  No prior CT. FINDINGS: Alignment: Trace scoliotic curvature. No listhesis or traumatic subluxation. Skull base and vertebrae: No acute fracture. Vertebral body heights are maintained. The dens and skull base are intact. Soft tissues and spinal canal: No prevertebral soft tissue edema. Vascular structures assistant concurrent neck CT, reported separately. Disc levels: Mild multilevel endplate spurring. Disc space narrowing at C5-C6 with mild canal stenosis Upper chest: Only minimally included, grossly negative. Other: None. IMPRESSION: Mild degenerative change in the cervical spine without acute fracture or subluxation. Electronically Signed   By: Keith Rake M.D.   On: 03/20/2021 17:43   EEG adult  Result Date: 03/21/2021 Serita Grammes, MD     03/21/2021  5:26 PM HISTORY The patient is a 55 year old woman with a history of migraines who is being evaluated for episodes of loss of consciousness with unilateral weakness.  TECHNICAL This study was performed according to the 10-20 International System of Electrode Placement. This record was reviewed under the following settings: bandpass filters of 1-70 Hz, sensitivity of 7 uV/mm, a display speed of 30 mm/sec, with a 60 Hz notched filter applied as appropriate.  MEDICATIONS Keppra  FINDINGS This study was recorded in the awake and drowsy states. A posterior dominant background rhythm of 8-9 Hz. No focal, lateralizating, or epileptiform discharges are seen.  HYPERVENTILATION: Not performed PHOTIC STIMULATION: Not performed  IMPRESSION This is a normal study recorded in the awake and drowsy states. No focal, lateralizating, or epileptiform discharges are seen.   ECHOCARDIOGRAM  COMPLETE  Result Date: 03/21/2021    ECHOCARDIOGRAM REPORT   Patient Name:   Delainee FABLE HUISMAN Date of Exam: 03/21/2021 Medical Rec #:  [id number]   Height:       62.0 in Accession #:    [id number]  Weight:       166.7 lb Date of Birth:  16-Jul-1966   BSA:          1.769 m Patient Age:    77 years    BP:  122/61 mmHg Patient Gender: F           HR:           66 bpm. Exam Location:  Inpatient Procedure: 2D Echo, Cardiac Doppler and Color Doppler Indications:    CVA  History:        Patient has prior history of Echocardiogram examinations, most                 recent 02/18/2019. COPD; Risk Factors:Diabetes, Hypertension,                 Current Smoker and Dyslipidemia.  Sonographer:    Dustin Flock Referring Phys: [id number] Viola  1. Left ventricular ejection fraction, by estimation, is 60 to 65%. The left ventricle has normal function. The left ventricle has no regional wall motion abnormalities. There is mild concentric left ventricular hypertrophy. Left ventricular diastolic parameters are consistent with Grade I diastolic dysfunction (impaired relaxation). Elevated left ventricular end-diastolic pressure.  2. Right ventricular systolic function is normal. The right ventricular size is normal. There is normal pulmonary artery systolic pressure.  3. The mitral valve is normal in structure. Trivial mitral valve regurgitation. No evidence of mitral stenosis.  4. The aortic valve is tricuspid. Aortic valve regurgitation is not visualized. No aortic stenosis is present.  5. The inferior vena cava is normal in size with greater than 50% respiratory variability, suggesting right atrial pressure of 3 mmHg. FINDINGS  Left Ventricle: Left ventricular ejection fraction, by estimation, is 60 to 65%. The left ventricle has normal function. The left ventricle has no regional wall motion abnormalities. The left ventricular internal cavity size was normal in size. There is  mild concentric left  ventricular hypertrophy. Left ventricular diastolic parameters are consistent with Grade I diastolic dysfunction (impaired relaxation). Elevated left ventricular end-diastolic pressure. Right Ventricle: The right ventricular size is normal. No increase in right ventricular wall thickness. Right ventricular systolic function is normal. There is normal pulmonary artery systolic pressure. The tricuspid regurgitant velocity is 1.19 m/s, and  with an assumed right atrial pressure of 3 mmHg, the estimated right ventricular systolic pressure is 8.7 mmHg. Left Atrium: Left atrial size was normal in size. Right Atrium: Right atrial size was normal in size. Pericardium: There is no evidence of pericardial effusion. Mitral Valve: The mitral valve is normal in structure. Trivial mitral valve regurgitation. No evidence of mitral valve stenosis. Tricuspid Valve: The tricuspid valve is normal in structure. Tricuspid valve regurgitation is trivial. No evidence of tricuspid stenosis. Aortic Valve: The aortic valve is tricuspid. Aortic valve regurgitation is not visualized. No aortic stenosis is present. Pulmonic Valve: The pulmonic valve was normal in structure. Pulmonic valve regurgitation is not visualized. No evidence of pulmonic stenosis. Aorta: The aortic root is normal in size and structure. Venous: The inferior vena cava is normal in size with greater than 50% respiratory variability, suggesting right atrial pressure of 3 mmHg. IAS/Shunts: No atrial level shunt detected by color flow Doppler.  LEFT VENTRICLE PLAX 2D LVIDd:         4.30 cm  Diastology LVIDs:         2.30 cm  LV e' medial:    5.87 cm/s LV PW:         1.20 cm  LV E/e' medial:  15.0 LV IVS:        1.10 cm  LV e' lateral:   8.59 cm/s LVOT diam:     1.80 cm  LV E/e'  lateral: 10.2 LV SV:         39 LV SV Index:   22 LVOT Area:     2.54 cm  RIGHT VENTRICLE RV Basal diam:  2.60 cm RV S prime:     9.90 cm/s TAPSE (M-mode): 2.7 cm LEFT ATRIUM             Index        RIGHT ATRIUM           Index LA diam:        3.10 cm 1.75 cm/m  RA Area:     10.70 cm LA Vol (A2C):   29.2 ml 16.51 ml/m RA Volume:   20.20 ml  11.42 ml/m LA Vol (A4C):   40.7 ml 23.01 ml/m LA Biplane Vol: 35.2 ml 19.90 ml/m  AORTIC VALVE LVOT Vmax:   79.00 cm/s LVOT Vmean:  49.500 cm/s LVOT VTI:    0.153 m  AORTA Ao Root diam: 2.80 cm MITRAL VALVE               TRICUSPID VALVE MV Area (PHT): 3.72 cm    TR Peak grad:   5.7 mmHg MV Decel Time: 204 msec    TR Vmax:        119.00 cm/s MV E velocity: 87.80 cm/s MV A velocity: 93.00 cm/s  SHUNTS MV E/A ratio:  0.94        Systemic VTI:  0.15 m                            Systemic Diam: 1.80 cm Skeet Latch MD Electronically signed by Skeet Latch MD Signature Date/Time: 03/21/2021/4:11:48 PM    Final    CT HEAD CODE STROKE WO CONTRAST  Result Date: 03/20/2021 CLINICAL DATA:  Code stroke.  Weakness EXAM: CT HEAD WITHOUT CONTRAST TECHNIQUE: Contiguous axial images were obtained from the base of the skull through the vertex without intravenous contrast. COMPARISON:  04/27/2020 FINDINGS: Brain: No acute intracranial hemorrhage, mass effect, or edema. Gray-white differentiation is preserved. Ventricles and sulci are normal in size and configuration. No extra-axial collection. Vascular: No hyperdense vessel. Skull: Unremarkable. Sinuses/Orbits: No acute finding. Other: Mastoid air cells are clear. ASPECTS ([place] Stroke Program Early CT Score) - Ganglionic level infarction (caudate, lentiform nuclei, internal capsule, insula, M1-M3 cortex): 7 - Supraganglionic infarction (M4-M6 cortex): 3 Total score (0-10 with 10 being normal): 10 IMPRESSION: There is no acute intracranial hemorrhage or evidence of acute infarction. ASPECT score is 10. These results were called by telephone at the time of interpretation on 03/20/2021 at 3:38 pm to provider Dr. Lorrin Goodell, who verbally acknowledged these results. Electronically Signed   By: Macy Mis M.D.   On: 03/20/2021  15:42    DG Chest 2 View  Result Date: 05/04/2021 CLINICAL DATA:  Cough for 2 weeks. EXAM: CHEST - 2 VIEW COMPARISON:  PA and lateral chest 02/10/2021. FINDINGS: Small focus of linear atelectasis is seen in the right middle lobe. The lungs are otherwise clear. Heart size normal. No pneumothorax or pleural fluid. No acute or focal bony abnormality. IMPRESSION: No acute disease. Electronically Signed   By: Inge Rise M.D.   On: 05/04/2021 12:12    Assessment & Plan:   Jozi was seen today for copd, cough and diabetes.  Diagnoses and all orders for this visit:  Essential hypertension- Her blood pressure is adequately well controlled. -     Basic metabolic panel; Future -  Magnesium; Future -     Magnesium -     Basic metabolic panel  Hypokalemia- Her potassium level is normal now. -     Basic metabolic panel; Future -     Magnesium; Future -     Magnesium -     Basic metabolic panel  Mucopurulent chronic bronchitis (HCC) -     DG Chest 2 View; Future -     revefenacin (YUPELRI) 175 MCG/3ML nebulizer solution; Take 3 mLs (175 mcg total) by nebulization daily. -     formoterol (PERFOROMIST) 20 MCG/2ML nebulizer solution; Take 2 mLs (20 mcg total) by nebulization 2 (two) times daily.  Menopausal vaginal dryness -     conjugated estrogens (PREMARIN) vaginal cream; Place 1 Applicatorful vaginally daily.  Chronic bronchitis with productive mucopurulent cough ([place])- Her chest x-ray is negative for mass or infiltrate. -     DG Chest 2 View; Future  Diabetes mellitus with complication ([place])- Her [id number] is down to 6.6% and she complains of weight gain.  Will discontinue the basal insulin and will increase the dose of the GLP-1 agonist. -     HM Diabetes Foot Exam -     POCT glycosylated hemoglobin (Hb A1C) -     Dulaglutide (TRULICITY) 3 0000000 SOPN; Inject 3 mg as directed once a week.   I have discontinued Jakyia B. Bradly's aspirin EC, estradiol, Trulicity, Levemir, clonazePAM,  and nitrofurantoin (macrocrystal-monohydrate). I am also having her start on revefenacin, formoterol, conjugated estrogens, and Trulicity. Additionally, I am having her maintain her Adjustable Lancing Device, AquaLance Lancets 30G, Insulin Pen Needle, cholecalciferol, [name], levETIRAcetam, traZODone, rosuvastatin, venlafaxine XR, pantoprazole, metoprolol tartrate, FreeStyle Libre 2 Reader, thiamine, fludrocortisone, FeroSul, irbesartan, FreeStyle Libre 2 Sensor, promethazine, Nurtec, and potassium chloride SA.  Meds ordered this encounter  Medications  . revefenacin (YUPELRI) 175 MCG/3ML nebulizer solution    Sig: Take 3 mLs (175 mcg total) by nebulization daily.    Dispense:  270 mL    Refill:  1  . formoterol (PERFOROMIST) 20 MCG/2ML nebulizer solution    Sig: Take 2 mLs (20 mcg total) by nebulization 2 (two) times daily.    Dispense:  360 mL    Refill:  1  . conjugated estrogens (PREMARIN) vaginal cream    Sig: Place 1 Applicatorful vaginally daily.    Dispense:  127.5 g    Refill:  1  . Dulaglutide (TRULICITY) 3 0000000 SOPN    Sig: Inject 3 mg as directed once a week.    Dispense:  6 mL    Refill:  1     Follow-up: Return in about 6 months (around 11/04/2021).  Scarlette Calico, MD

## 2021-05-04 NOTE — Patient Instructions (Signed)
Type 2 Diabetes Mellitus, Diagnosis, Adult Type 2 diabetes (type 2 diabetes mellitus) is a long-term, or chronic, disease. In type 2 diabetes, one or both of these problems may be present:  The pancreas does not make enough of a hormone called insulin.  Cells in the body do not respond properly to insulin that the body makes (insulin resistance). Normally, insulin allows blood sugar (glucose) to enter cells in the body. The cells use glucose for energy. Insulin resistance or lack of insulin causes excess glucose to build up in the blood instead of going into cells. This causes high blood glucose (hyperglycemia).  What are the causes? The exact cause of type 2 diabetes is not known. What increases the risk? The following factors may make you more likely to develop this condition:  Having a family member with type 2 diabetes.  Being overweight or obese.  Being inactive (sedentary).  Having been diagnosed with insulin resistance.  Having a history of prediabetes, diabetes when you were pregnant (gestational diabetes), or polycystic ovary syndrome (PCOS). What are the signs or symptoms? In the early stage of this condition, you may not have symptoms. Symptoms develop slowly and may include:  Increased thirst or hunger.  Increased urination.  Unexplained weight loss.  Tiredness (fatigue) or weakness.  Vision changes, such as blurry vision.  Dark patches on the skin. How is this diagnosed? This condition is diagnosed based on your symptoms, your medical history, a physical exam, and your blood glucose level. Your blood glucose may be checked with one or more of the following blood tests:  A fasting blood glucose (FBG) test. You will not be allowed to eat (you will fast) for 8 hours or longer before a blood sample is taken.  A random blood glucose test. This test checks blood glucose at any time of day regardless of when you ate.  An A1C (hemoglobin A1C) blood test. This test  provides information about blood glucose levels over the previous 2-3 months.  An oral glucose tolerance test (OGTT). This test measures your blood glucose at two times: ? After fasting. This is your baseline blood glucose level. ? Two hours after drinking a beverage that contains glucose. You may be diagnosed with type 2 diabetes if:  Your fasting blood glucose level is 126 mg/dL (7.0 mmol/L) or higher.  Your random blood glucose level is 200 mg/dL (11.1 mmol/L) or higher.  Your A1C level is 6.5% or higher.  Your oral glucose tolerance test result is higher than 200 mg/dL (11.1 mmol/L). These blood tests may be repeated to confirm your diagnosis.   How is this treated? Your treatment may be managed by a specialist called an endocrinologist. Type 2 diabetes may be treated by following instructions from your health care provider about:  Making dietary and lifestyle changes. These may include: ? Following a personalized nutrition plan that is developed by a registered dietitian. ? Exercising regularly. ? Finding ways to manage stress.  Checking your blood glucose level as often as told.  Taking diabetes medicines or insulin daily. This helps to keep your blood glucose levels in the healthy range.  Taking medicines to help prevent complications from diabetes. Medicines may include: ? Aspirin. ? Medicine to lower cholesterol. ? Medicine to control blood pressure. Your health care provider will set treatment goals for you. Your goals will be based on your age, other medical conditions you have, and how you respond to diabetes treatment. Generally, the goal of treatment is to maintain the   following blood glucose levels:  Before meals: 80-130 mg/dL (4.4-7.2 mmol/L).  After meals: below 180 mg/dL (10 mmol/L).  A1C level: less than 7%. Follow these instructions at home: Questions to ask your health care provider Consider asking the following questions:  Should I meet with a certified  diabetes care and education specialist?  What diabetes medicines do I need, and when should I take them?  What equipment will I need to manage my diabetes at home?  How often do I need to check my blood glucose?  Where can I find a support group for people with diabetes?  What number can I call if I have questions?  When is my next appointment? General instructions  Take over-the-counter and prescription medicines only as told by your health care provider.  Keep all follow-up visits as told by your health care provider. This is important. Where to find more information  American Diabetes Association (ADA): www.diabetes.org  American Association of Diabetes Care and Education Specialists (ADCES): www.diabeteseducator.org  International Diabetes Federation (IDF): www.idf.org Contact a health care provider if:  Your blood glucose is at or above 240 mg/dL (13.3 mmol/L) for 2 days in a row.  You have been sick or have had a fever for 2 days or longer, and you are not getting better.  You have any of the following problems for more than 6 hours: ? You cannot eat or drink. ? You have nausea and vomiting. ? You have diarrhea. Get help right away if:  You have severe hypoglycemia. This means your blood glucose is lower than 54 mg/dL (3.0 mmol/L).  You become confused or you have trouble thinking clearly.  You have difficulty breathing.  You have moderate or large ketone levels in your urine. These symptoms may represent a serious problem that is an emergency. Do not wait to see if the symptoms will go away. Get medical help right away. Call your local emergency services (911 in the [place] Do not drive yourself to the hospital. Summary  Type 2 diabetes (type 2 diabetes mellitus) is a long-term, or chronic, disease. In type 2 diabetes, the pancreas does not make enough of a hormone called insulin, or cells in the body do not respond properly to insulin that the body makes (insulin  resistance).  This condition is treated by making dietary and lifestyle changes and taking diabetes medicines or insulin.  Your health care provider will set treatment goals for you. Your goals will be based on your age, other medical conditions you have, and how you respond to diabetes treatment.  Keep all follow-up visits as told by your health care provider. This is important. This information is not intended to replace advice given to you by your health care provider. Make sure you discuss any questions you have with your health care provider. Document Revised: 07/06/2020 Document Reviewed: 07/06/2020 Elsevier Patient Education  2021 [employer]  

## 2021-05-04 NOTE — Telephone Encounter (Signed)
Verbal orders given  

## 2021-05-04 NOTE — Telephone Encounter (Signed)
Mickel Baas w/ [facility] called and is requesting verbals for PT 1w4. Please advise    Okay to LVM: [phone]

## 2021-05-05 ENCOUNTER — Other Ambulatory Visit: Payer: Self-pay | Admitting: Internal Medicine

## 2021-05-05 ENCOUNTER — Other Ambulatory Visit: Payer: Self-pay | Admitting: Family

## 2021-05-09 ENCOUNTER — Emergency Department (HOSPITAL_COMMUNITY)
Admission: EM | Admit: 2021-05-09 | Discharge: 2021-05-09 | Disposition: A | Discharge status: Home / Self Care | Payer: 59 | Attending: Emergency Medicine | Admitting: Emergency Medicine

## 2021-05-09 ENCOUNTER — Telehealth: Payer: Self-pay | Admitting: Internal Medicine

## 2021-05-09 ENCOUNTER — Encounter (HOSPITAL_COMMUNITY): Payer: Self-pay

## 2021-05-09 ENCOUNTER — Emergency Department (HOSPITAL_COMMUNITY): Payer: 59

## 2021-05-09 DIAGNOSIS — E119 Type 2 diabetes mellitus without complications: Secondary | ICD-10-CM | POA: Diagnosis not present

## 2021-05-09 DIAGNOSIS — F17210 Nicotine dependence, cigarettes, uncomplicated: Secondary | ICD-10-CM | POA: Diagnosis not present

## 2021-05-09 DIAGNOSIS — Z79899 Other long term (current) drug therapy: Secondary | ICD-10-CM | POA: Diagnosis not present

## 2021-05-09 DIAGNOSIS — I10 Essential (primary) hypertension: Secondary | ICD-10-CM | POA: Insufficient documentation

## 2021-05-09 DIAGNOSIS — R531 Weakness: Secondary | ICD-10-CM

## 2021-05-09 DIAGNOSIS — J449 Chronic obstructive pulmonary disease, unspecified: Secondary | ICD-10-CM | POA: Insufficient documentation

## 2021-05-09 DIAGNOSIS — Z794 Long term (current) use of insulin: Secondary | ICD-10-CM | POA: Insufficient documentation

## 2021-05-09 DIAGNOSIS — R202 Paresthesia of skin: Secondary | ICD-10-CM | POA: Diagnosis not present

## 2021-05-09 DIAGNOSIS — R55 Syncope and collapse: Secondary | ICD-10-CM | POA: Diagnosis present

## 2021-05-09 LAB — COMPREHENSIVE METABOLIC PANEL
ALT: 18 U/L (ref 0–44)
AST: 17 U/L (ref 15–41)
Albumin: 3.5 g/dL (ref 3.5–5.0)
Alkaline Phosphatase: 105 U/L (ref 38–126)
Anion gap: 10 (ref 5–15)
BUN: 9 mg/dL (ref 6–20)
CO2: 29 mmol/L (ref 22–32)
Calcium: 9 mg/dL (ref 8.9–10.3)
Chloride: 101 mmol/L (ref 98–111)
Creatinine, Ser: 0.69 mg/dL (ref 0.44–1.00)
GFR, Estimated: >60 mL/min (ref >60)
Glucose, Bld: 151 mg/dL — ABNORMAL HIGH (ref 70–99)
Potassium: 3.1 mmol/L — ABNORMAL LOW (ref 3.5–5.1)
Sodium: 140 mmol/L (ref 135–145)
Total Bilirubin: 0.6 mg/dL (ref 0.3–1.2)
Total Protein: 6.5 g/dL (ref 6.5–8.1)

## 2021-05-09 LAB — DIFFERENTIAL
Abs Immature Granulocytes: 0.03 10*3/uL (ref 0.00–0.07)
Basophils Absolute: 0.1 10*3/uL (ref 0.0–0.1)
Basophils Relative: 1 %
Eosinophils Absolute: 0.2 10*3/uL (ref 0.0–0.5)
Eosinophils Relative: 3 %
Immature Granulocytes: 0 %
Lymphocytes Relative: 27 %
Lymphs Abs: 2.3 10*3/uL (ref 0.7–4.0)
Monocytes Absolute: 0.7 10*3/uL (ref 0.1–1.0)
Monocytes Relative: 8 %
Neutro Abs: 5.2 10*3/uL (ref 1.7–7.7)
Neutrophils Relative %: 61 %

## 2021-05-09 LAB — CBC
HCT: 42.4 % (ref 36.0–46.0)
Hemoglobin: 14.5 g/dL (ref 12.0–15.0)
MCH: 32.7 pg (ref 26.0–34.0)
MCHC: 34.2 g/dL (ref 30.0–36.0)
MCV: 95.5 fL (ref 80.0–100.0)
Platelets: 321 10*3/uL (ref 150–400)
RBC: 4.44 MIL/uL (ref 3.87–5.11)
RDW: 13 % (ref 11.5–15.5)
WBC: 8.5 10*3/uL (ref 4.0–10.5)
nRBC: 0 % (ref 0.0–0.2)

## 2021-05-09 LAB — I-STAT BETA HCG BLOOD, ED (MC, WL, AP ONLY): I-stat hCG, quantitative: <5 m[IU]/mL (ref <5)

## 2021-05-09 LAB — I-STAT CHEM 8, ED
BUN: 9 mg/dL (ref 6–20)
Calcium, Ion: 1.12 mmol/L — ABNORMAL LOW (ref 1.15–1.40)
Chloride: 101 mmol/L (ref 98–111)
Creatinine, Ser: 0.6 mg/dL (ref 0.44–1.00)
Glucose, Bld: 151 mg/dL — ABNORMAL HIGH (ref 70–99)
HCT: 40 % (ref 36.0–46.0)
Hemoglobin: 13.6 g/dL (ref 12.0–15.0)
Potassium: 3.1 mmol/L — ABNORMAL LOW (ref 3.5–5.1)
Sodium: 142 mmol/L (ref 135–145)
TCO2: 29 mmol/L (ref 22–32)

## 2021-05-09 LAB — APTT: aPTT: 24 seconds (ref 24–36)

## 2021-05-09 LAB — PROTIME-INR
INR: 1 (ref 0.8–1.2)
Prothrombin Time: 12.7 seconds (ref 11.4–15.2)

## 2021-05-09 LAB — CBG MONITORING, ED: Glucose-Capillary: 159 mg/dL — ABNORMAL HIGH (ref 70–99)

## 2021-05-09 MED ORDER — POTASSIUM CHLORIDE CRYS ER 20 MEQ PO TBCR
40.0000 meq | EXTENDED_RELEASE_TABLET | Freq: Once | ORAL | Status: AC
  Administered 2021-05-09: 40 meq via ORAL
  Filled 2021-05-09: qty 2

## 2021-05-09 MED ORDER — SODIUM CHLORIDE 0.9 % IV BOLUS
500.0000 mL | Freq: Once | INTRAVENOUS | Status: AC
  Administered 2021-05-09: 500 mL via INTRAVENOUS

## 2021-05-09 MED ORDER — SODIUM CHLORIDE 0.9% FLUSH
3.0000 mL | Freq: Once | INTRAVENOUS | Status: DC
Start: 2021-05-09 — End: 2021-05-10

## 2021-05-09 NOTE — ED Notes (Signed)
Code stroke cancelled at 1617.

## 2021-05-09 NOTE — Telephone Encounter (Signed)
    Patient calling to report elevated BP 162/98, dizziness and tingling on left side of body   Call transferred to [facility] for triage

## 2021-05-09 NOTE — ED Triage Notes (Signed)
Code stroke arrived at 1533 with symptoms of aphasia, syncope, blurred vision and left sided weakness. Last known normal at 1430. Also noted with confusion. Hx of stroke on March 2022 and was on TPA. Blood sugar was 141.

## 2021-05-09 NOTE — ED Provider Notes (Signed)
[facility] DEPARTMENT Provider Note   CSN: [id number] Arrival date & time: 05/09/21  1533  An emergency department physician performed an initial assessment on this suspected stroke patient at 1535.  History Chief Complaint  Patient presents with  . Code Stroke    Ashley [name] is a 55 y.o. 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  Patient and her husband provide history.  Reports physical therapist had just arrived to her house.  She began complaining of numbness to her left arm, left leg and left face.  Also had some weakness.  Patient's husband states he had a syncopal episode while sitting on the couch, lasting a minute or so in duration. This was followed by brief confusion.   EMS reported left arm/leg drift and aphasia.  Code stroke was activated.  Neurology evaluated, does not recommend tPA at this time pending imaging.  Overall though has been ambulating well, better than baseline at the last couple of weeks.  Patient's husband reports residual left-sided deficits from prior episodes.  He states they were TIAs.  Patient denies any changes in p.o. intake.  Denies any recent vomiting or diarrhea.  No recent illness.  Denies chest pain, palpitations or shortness of breath prior to syncopal episode.   The history is provided by the patient, the spouse and the EMS personnel.       Past Medical History:  Diagnosis Date  . Anxiety   . Atypical ductal hyperplasia of left breast   . Bronchitis    uses inhaler if needed for bronchitis, lasted used -2014  . Cataract    bilateral  . Chronic headaches    migraines - in past, uses Phenergan for nausea   . COPD (chronic obstructive pulmonary disease) ([place])   . Deficiency anemia 12/30/2017  . Depression   . Diabetes mellitus without complication ([place])    taken off  Metformin since 04/2014, HgbA1C - normal, will follow up with PCP- Dr. Deborra Medina, 07/2014  . GERD (gastroesophageal reflux disease)   . H/O exercise stress test 2011   done at [facility]- told that it was WNL, done due to pt. having panic attacks   . History of blood transfusion Dec 09, 1966   at birth in [place], [place], unsure number of units  . PONV (postoperative nausea and vomiting)   . Poor dentition    very poor oral health   . SVD (spontaneous vaginal delivery)    x 3  . Tobacco abuse   . Wears glasses     Patient Active Problem List   Diagnosis Date Noted  . Chronic bronchitis with productive mucopurulent cough ([place]) 05/04/2021  . Hypokalemia 04/13/2021  . 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 Autonomic dysfunction 08/22/2020  . Paroxysmal SVT (supraventricular tachycardia) ([place]) 07/25/2020  . Menopausal vaginal dryness 07/25/2020  . Hot flashes, menopausal 11/10/2019  . Bilateral hearing loss 11/10/2019  . Dietary iron deficiency 09/01/2019  . Lumbar radiculitis 02/10/2019  . Gastroesophageal reflux disease with esophagitis 02/10/2019  . Essential hypertension 10/20/2018  . Chronic migraine without aura, with intractable migraine, so stated, with status migrainosus 07/02/2018  . Thiamine deficiency 01/05/2018  . Constipation 09/12/2017  . Moderate episode of recurrent major depressive disorder ([place]) 05/16/2017  . Diabetes mellitus with complication ([place])   . 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 Tobacco abuse 01/08/2017  . Chronic bronchitis ([place]) 10/20/2015  . Dyslipidemia,  goal LDL below 70 04/12/2015  . Migraine with aura, intractable 08/12/2014  . Routine general medical examination at a health care facility 05/24/2011  . Anxiety state 09/30/2009    Past Surgical History:  Procedure Laterality Date  . ABDOMINAL HYSTERECTOMY    . APPENDECTOMY  1988  . BREAST LUMPECTOMY WITH RADIOACTIVE SEED LOCALIZATION Left 03/17/2020   Procedure: LEFT BREAST LUMPECTOMY WITH RADIOACTIVE  SEED LOCALIZATION;  Surgeon: Coralie Keens, MD;  Location: [facility];  Service: General;  Laterality: Left;  LMA  . CARDIAC CATHETERIZATION N/A 08/17/2016   Procedure: Left Heart Cath and Coronary Angiography;  Surgeon: Burnell Blanks, MD;  Location: [facility];  Service: Cardiovascular;  Laterality: N/A;  . CHOLECYSTECTOMY N/A 06/07/2014   Procedure: LAPAROSCOPIC CHOLECYSTECTOMY;  Surgeon: Ralene Ok, MD;  Location: [facility];  Service: General;  Laterality: N/A;  . CHOLECYSTECTOMY  June 07 2014  . COLONOSCOPY  02/16/2014   normal   . ESOPHAGOGASTRODUODENOSCOPY  12/28/2013  . KNEE ARTHROSCOPY  1995   left  . LAPAROSCOPIC ASSISTED VAGINAL HYSTERECTOMY N/A 04/15/2014   Procedure: LAPAROSCOPIC ASSISTED VAGINAL HYSTERECTOMY;  Surgeon: Marylynn Pearson, MD;  Location: [facility];  Service: Gynecology;  Laterality: N/A;  . LAPAROSCOPIC BILATERAL SALPINGO OOPHERECTOMY Bilateral 04/15/2014   Procedure: LAPAROSCOPIC BILATERAL SALPINGO OOPHORECTOMY;  Surgeon: Marylynn Pearson, MD;  Location: [facility];  Service: Gynecology;  Laterality: Bilateral;  . TUBAL LIGATION       OB History   No obstetric history on file.     Family History  Problem Relation Age of Onset  . Diabetes Mother   . Hyperlipidemia Mother   . Hypertension Mother   . Anxiety disorder Mother   . Depression Mother   . Drug abuse Mother   . Stroke Mother        x3  . Prostate cancer Father   . Alcohol abuse Father   . Lung cancer Maternal Grandfather        smoked  . Emphysema Maternal Grandfather        smoked  . COPD Maternal Grandmother        never smoked   . Dementia Maternal Grandmother   . Thyroid cancer Paternal Aunt   . Colon cancer Neg Hx   . Rectal cancer Neg Hx   . Stomach cancer Neg Hx   . Migraines Neg Hx     Social History   Tobacco Use  . Smoking status: Current Every Day Smoker    Packs/day: 1.50    Years: 35.00    Pack years: 52.50    Types: Cigarettes  . Smokeless tobacco: Never  Used  Vaping Use  . Vaping Use: Never used  Substance Use Topics  . Alcohol use: No    Alcohol/week: 0.0 standard drinks  . Drug use: No    Home Medications Prior to Admission medications   Medication Sig Start Date End Date Taking? Authorizing Provider  [name] 225 MG/1.5ML SOSY INJECT 225 MG INTO THE SKIN EVERY 30 DAYS Patient taking differently: Inject 225 mg into the skin every 30 (thirty) days. 09/01/20   Lomax, Amy, NP  albuterol (VENTOLIN HFA) 108 (90 Base) MCG/ACT inhaler INHALE 2 PUFFS INTO THE LUNGS EVERY 6 HOURS AS NEEDED FOR WHEEZING OR SHORTNESS OF BREATH 05/05/21   Janith Lima, MD  AQUALANCE LANCETS 30G MISC  11/27/17   [provider]  cholecalciferol (VITAMIN D3) 25 MCG (1000 UT) tablet Take 2,000 Units by mouth daily.    [provider]  conjugated estrogens (PREMARIN) vaginal cream Place 1 Applicatorful vaginally daily. 05/04/21   Janith Lima, MD  Continuous Blood Gluc Receiver (FREESTYLE LIBRE 2 READER) DEVI 1 Act by Does not apply route daily. 03/15/21   Janith Lima, MD  Continuous Blood Gluc Sensor (FREESTYLE LIBRE 2 SENSOR) MISC 1 Act by Does not apply route daily. 04/12/21   Janith Lima, MD  Dulaglutide (TRULICITY) 3 0000000 SOPN Inject 3 mg as directed once a week. 05/04/21   Janith Lima, MD  FEROSUL 325 (65 Fe) MG tablet TAKE 1 TABLET(325 MG) BY MOUTH TWICE DAILY WITH A MEAL 04/04/21   Janith Lima, MD  fludrocortisone (FLORINEF) 0.1 MG tablet Take 1 tablet (0.1 mg total) by mouth daily. 03/27/21   Janith Lima, MD  formoterol (PERFOROMIST) 20 MCG/2ML nebulizer solution Take 2 mLs (20 mcg total) by nebulization 2 (two) times daily. 05/04/21   Janith Lima, MD  Insulin Pen Needle 32G X 6 MM MISC Use daily as directed 05/12/18   Marrian Salvage, FNP  irbesartan (AVAPRO) 150 MG tablet Take 1 tablet (150 mg total) by mouth daily. 04/10/21   Janith Lima, MD  Lancet Devices (ADJUSTABLE LANCING DEVICE) MISC Up to tid testing  09/19/17   [provider]  levETIRAcetam (KEPPRA) 500 MG tablet Take 500 mg by mouth 2 (two) times daily.    [provider]  metoprolol tartrate (LOPRESSOR) 50 MG tablet Take 1 tablet (50 mg total) by mouth 2 (two) times daily. 02/22/21 05/23/21  Almyra Deforest, PA  pantoprazole (PROTONIX) 40 MG tablet TAKE 1 TABLET(40 MG) BY MOUTH DAILY Patient taking differently: Take 40 mg by mouth daily. 01/23/21   Janith Lima, MD  potassium chloride SA (KLOR-CON) 20 MEQ tablet TAKE 1 TABLET(20 MEQ) BY MOUTH TWICE DAILY 04/24/21   Janith Lima, MD  promethazine (PHENERGAN) 25 MG tablet Take 25 mg by mouth as needed for nausea or vomiting.    [provider]  revefenacin (YUPELRI) 175 MCG/3ML nebulizer solution Take 3 mLs (175 mcg total) by nebulization daily. 05/04/21   Janith Lima, MD  Rimegepant Sulfate (NURTEC) 75 MG TBDP Take 75 mg by mouth daily as needed. For migraines. Take as close to onset of migraine as possible. One daily maximum. 04/13/21   Janith Lima, MD  rosuvastatin (CRESTOR) 20 MG tablet TAKE 1 TABLET BY MOUTH EVERY DAY, NEED OFFICE VISIT FOR MORE REFILLS Patient taking differently: Take 20 mg by mouth daily. 01/07/21   Janith Lima, MD  thiamine (VITAMIN B-1) 50 MG tablet Take 1 tablet (50 mg total) by mouth daily. 03/18/21   Janith Lima, MD  traZODone (DESYREL) 150 MG tablet TAKE 1 TABLET(150 MG) BY MOUTH AT BEDTIME Patient taking differently: Take 150 mg by mouth at bedtime. 01/02/21   Janith Lima, MD  venlafaxine XR (EFFEXOR-XR) 150 MG 24 hr capsule TAKE 1 CAPSULE(150 MG) BY MOUTH DAILY WITH BREAKFAST Patient taking differently: Take 150 mg by mouth at bedtime. 01/09/21   Melvenia Beam, MD    Allergies    Penicillins, Topamax [topiramate], and Metformin and related  Review of Systems   Review of Systems  Neurological: Positive for syncope, speech difficulty, weakness and numbness.  All other systems reviewed and are negative.   Physical  Exam Updated Vital Signs BP (!) 175/85   Pulse 61   Temp 98.1 F (36.7 C) (Oral)   Resp 12   LMP 02/12/2014   SpO2  96%   Physical Exam Vitals and nursing note reviewed.  Constitutional:      General: She is not in acute distress.    Appearance: She is well-developed.  HENT:     Head: Normocephalic and atraumatic.     Mouth/Throat:     Comments: Severely decayed dentition throughout mouth Eyes:     Conjunctiva/sclera: Conjunctivae normal.  Cardiovascular:     Rate and Rhythm: Normal rate and regular rhythm.  Pulmonary:     Effort: Pulmonary effort is normal. No respiratory distress.     Breath sounds: Normal breath sounds.  Abdominal:     General: Bowel sounds are normal. There is no distension.     Palpations: Abdomen is soft.     Tenderness: There is no abdominal tenderness.  Skin:    General: Skin is warm.  Neurological:     Mental Status: She is alert.     Comments: Mental Status:  Alert, oriented, thought content appropriate, able to give a coherent history. Speech fluent without evidence of aphasia. Able to follow 2 step commands without difficulty.  Cranial Nerves:  II:   pupils equal, round, reactive to light III,IV, VI: ptosis not present, extra-ocular motions intact bilaterally  V,VII: smile symmetric, facial light touch sensation decreased on left VIII: hearing grossly normal to voice  X: uvula elevates symmetrically  XI: bilateral shoulder shrug symmetric and strong XII: midline tongue extension without fassiculations Motor:  Normal tone. 3-4/5 grip strength in LUE and 5/5 grip RUE, 5/5 strength b/l lower extremities with dorsiflexion/plantar flexion Sensory: decreased LUE and LLE, normal to left torso. normal in right upper/lower extremities.  Cerebellar: normal finger-to-nose with bilateral upper extremities CV: distal pulses palpable throughout    Psychiatric:        Behavior: Behavior normal.     ED Results / Procedures / Treatments   Labs (all  labs ordered are listed, but only abnormal results are displayed) Labs Reviewed  COMPREHENSIVE METABOLIC PANEL - Abnormal; Notable for the following components:      Result Value   Potassium 3.1 (*)    Glucose, Bld 151 (*)    All other components within normal limits  CBG MONITORING, ED - Abnormal; Notable for the following components:   Glucose-Capillary 159 (*)    All other components within normal limits  I-STAT CHEM 8, ED - Abnormal; Notable for the following components:   Potassium 3.1 (*)    Glucose, Bld 151 (*)    Calcium, Ion 1.12 (*)    All other components within normal limits  PROTIME-INR  APTT  CBC  DIFFERENTIAL  CBG MONITORING, ED  I-STAT BETA HCG BLOOD, ED ([facility], [facility], [facility] ONLY)    EKG None  Radiology MR BRAIN WO CONTRAST  Result Date: 05/09/2021 CLINICAL DATA:  Neuro deficit, acute stroke suspected. EXAM: MRI HEAD WITHOUT CONTRAST TECHNIQUE: Multiplanar, multiecho pulse sequences of the brain and surrounding structures were obtained without intravenous contrast. COMPARISON:  None. FINDINGS: Per Dr. [name] request, limited study performed with only DWI/ADC, axial FLAIR, and GRE performed. Within this limitation: Brain: No acute infarction, hemorrhage, hydrocephalus, extra-axial collection or mass lesion. Sinuses/Orbits: Sinuses are clear.  Unremarkable orbits. Other: No sizable mastoid effusions. IMPRESSION: No evidence of acute infarct on this limited study (as detailed above). Electronically Signed   By: Margaretha Sheffield MD   On: 05/09/2021 16:33   CT HEAD CODE STROKE WO CONTRAST  Result Date: 05/09/2021 CLINICAL DATA:  Code stroke.  Acute neuro deficit. EXAM: CT HEAD  WITHOUT CONTRAST TECHNIQUE: Contiguous axial images were obtained from the base of the skull through the vertex without intravenous contrast. COMPARISON:  CT head 03/21/2021 FINDINGS: Brain: No evidence of acute infarction, hemorrhage, hydrocephalus, extra-axial collection or mass lesion/mass effect.  Vascular: Negative for hyperdense vessel Skull: 12 mm low-density lesion in the left medial parietal bone unchanged. Benign appearance. Sinuses/Orbits: Paranasal sinuses clear. Negative orbit. Left cataract extraction Other: None ASPECTS ([place] Stroke Program Early CT Score) - Ganglionic level infarction (caudate, lentiform nuclei, internal capsule, insula, M1-M3 cortex): 7 - Supraganglionic infarction (M4-M6 cortex): 3 Total score (0-10 with 10 being normal): 10 IMPRESSION: 1. No acute abnormality 2. ASPECTS is 10 3. Code stroke imaging results were communicated on 05/09/2021 at 3:54 pm to provider Rory Percy via text page Electronically Signed   By: Franchot Gallo M.D.   On: 05/09/2021 15:54    Procedures Procedures   Medications Ordered in ED Medications  sodium chloride flush (NS) 0.9 % injection 3 mL (3 mLs Intravenous Not Given 05/09/21 1612)  potassium chloride SA (KLOR-CON) CR tablet 40 mEq (40 mEq Oral Given 05/09/21 1709)  sodium chloride 0.9 % bolus 500 mL (500 mLs Intravenous New Bag/Given 05/09/21 1714)    ED Course  I have reviewed the triage vital signs and the nursing notes.  Pertinent labs & imaging results that were available during my care of the patient were reviewed by me and considered in my medical decision making (see chart for details).  Clinical Course as of 05/09/21 [place] May 09, 2021  1542 Patient evaluated at the bridge, sensory deficit to left face, arm and leg. Patient to [place] with neurology. Maintaining airway without difficulty. [JR]  [id number] LH is resolved after IVF. Will recommend outpatient neurology follow up, per neurologist recommendation.  [JR]    Clinical Course User Index [JR] Carl Butner, [place] N, PA-C   MDM Rules/Calculators/A&P                          Patient evaluated for left-sided weakness and numbness began today followed by syncopal episode.  Neurology evaluated, emergent CT head as well as MRI brain are both negative for acute stroke.  No  further neurologic work-up or management necessary per neurology team today.  She is appropriate for follow-up with her neurologist outpatient regarding her neurologic complaints today.  Per discussion with patient's husband, it appears she has chronic left-sided weakness, is getting PT OT at home.   Blood work is overall reassuring. Mild hypokalemia, replaced orally. She is given IVF with improvement. Recent ECHO in march of this year and normal. ECG without ischemia or new arrhythmia.   Discussed work-up and care plan with attending physician Dr. Reather Converse.  He is in agreement with patient discharged to home with outpatient follow-up.    Final Clinical Impression(s) / ED Diagnoses Final diagnoses:  Syncope, unspecified syncope type  Paresthesias    Rx / DC Orders ED Discharge Orders    None       Kaylon Hitz, [place] N, PA-C 05/09/21 1907    Elnora Morrison, MD 05/10/21 0041

## 2021-05-09 NOTE — ED Notes (Signed)
Arrived in [place] at 1533 as a code stroke. Symptoms were blurred vision,syncope at home, aphasia, left sided weakness and confusion. EMS reports bp was 230/130 manually. LKN was 1430. Hx of stroke 2 months ago and was given TPA as well.

## 2021-05-09 NOTE — Discharge Instructions (Addendum)
Please follow closely with your neurologist regarding your visit today. It is important you stay hydrated. As there were some concerns about your blood pressure today, I recommend you keep a daily blood pressure log to bring to your next primary care appointment to determine if you need any medication changes. Return for any new or concerning symptoms.

## 2021-05-09 NOTE — Telephone Encounter (Signed)
Team health FYI:  --Pt has tingling in left side, dizziness and BP is 162/98., started approx 45 minutes ago.  Advised to call EMS 911, patient understood

## 2021-05-09 NOTE — Code Documentation (Signed)
Pt is a 55 yr old female with multiple medical problems who had sudden onset of Lt sided weakness and decreased sensation and blurred vision at 1430 today. She also had syncopal episode at that time per EMS. EMS activated code stroke at 1523 and pt arrived at 73. Bloodwork, CBG obtained, and cleared for CT by EDP at bridge. Pt arrived to CT at 1535. Pt still weak on left, and has left sensory loss. (See flowsheet for full NIH and code stroke timeline). CTNC negative for acute hemorrhage per Dr Rory Percy. Pt taken to stat MRI to r/o ischemia at 1550. Per Dr Rory Percy, MRI is negative for ischemia, and code stroke was cancelled at 1615. Pt was returned to ED room 4 where her workup will continue. Bedside handoff with Katrina RN complete. Pt not candidate for TPA or NIR as she is negative for stroke.

## 2021-05-09 NOTE — Consult Note (Signed)
Neurology Consultation  Reason for Consult: CODE STROKE left-sided weakness and decreased sensation on the left hemibody Referring Physician: Dr. Regenia Skeeter  CC: Left-sided weakness and numbness  History is obtained from: EMS, Chart Review, Patient  HPI: Ashley [name] is a 55 y.o. 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 She states that she went to let the dogs out and when she got back to the couch at 14:30 she had a sudden onset of left-sided weakness, bilateral blurry vision, and decreased sensation of the left arm, leg, and face. Patient's husband states he witnessed her have a syncopal episode during this time followed by confusion. On EMS arrival she was noted to have a drift of the left arm and leg and aphasia with an initial blood pressure of 230/108. A stroke alert was activated and she was brought to [facility] ED for further evaluation.   Of note, patient had a similar presentation with left-sided weakness, trouble speaking, and dizziness on 03/20/2021 and received tPA at this time.   LKW: 14:30 tpa given?: no, imaging without evidence of ischemia / infarct IR Thrombectomy? No, presentation not consistent with LVO Modified Rankin Scale: 2-Slight disability-UNABLE to perform all activities but does not need assistance  ROS: A complete ROS was performed and is negative except as noted in the HPI.   Past Medical History:  Diagnosis Date  . Anxiety   . Atypical ductal hyperplasia of left breast   . Bronchitis    uses inhaler if needed for bronchitis, lasted used -2014  . Cataract    bilateral  . Chronic headaches    migraines - in past, uses Phenergan for nausea   . COPD (chronic  obstructive pulmonary disease) ([place])   . Deficiency anemia 12/30/2017  . Depression   . Diabetes mellitus without complication ([place])    taken off Metformin since 04/2014, HgbA1C - normal, will follow up with PCP- Dr. Deborra Medina, 07/2014  . GERD (gastroesophageal reflux disease)   . H/O exercise stress test 2011   done at [facility]- told that it was WNL, done due to pt. having panic attacks   . History of blood transfusion [birth year], 1966   at birth in [place], [place], unsure number of units  . PONV (postoperative nausea and vomiting)   . Poor dentition    very poor oral health   . SVD (spontaneous vaginal delivery)    x 3  . Tobacco abuse   . Wears glasses    Past Surgical History:  Procedure Laterality Date  . ABDOMINAL HYSTERECTOMY    . APPENDECTOMY  1988  . BREAST LUMPECTOMY WITH RADIOACTIVE SEED LOCALIZATION Left 03/17/2020   Procedure: LEFT BREAST LUMPECTOMY WITH RADIOACTIVE SEED LOCALIZATION;  Surgeon: Coralie Keens, MD;  Location: [place];  Service: General;  Laterality: Left;  LMA  . CARDIAC CATHETERIZATION N/A 08/17/2016   Procedure: Left Heart Cath and Coronary Angiography;  Surgeon: Burnell Blanks, MD;  Location: [place] [facility];  Service: Cardiovascular;  Laterality: N/A;  . CHOLECYSTECTOMY N/A 06/07/2014   Procedure: LAPAROSCOPIC CHOLECYSTECTOMY;  Surgeon: Ralene Ok, MD;  Location: [place];  Service: General;  Laterality: N/A;  . CHOLECYSTECTOMY  June 07 2014  . COLONOSCOPY  02/16/2014   normal   .  ESOPHAGOGASTRODUODENOSCOPY  12/28/2013  . KNEE ARTHROSCOPY  1995   left  . LAPAROSCOPIC ASSISTED VAGINAL HYSTERECTOMY N/A 04/15/2014   Procedure: LAPAROSCOPIC ASSISTED VAGINAL HYSTERECTOMY;  Surgeon: Marylynn Pearson, MD;  Location: [facility];  Service: Gynecology;  Laterality: N/A;  . LAPAROSCOPIC BILATERAL SALPINGO OOPHERECTOMY Bilateral 04/15/2014   Procedure: LAPAROSCOPIC BILATERAL SALPINGO OOPHORECTOMY;  Surgeon: Marylynn Pearson, MD;  Location: [facility];  Service: Gynecology;   Laterality: Bilateral;  . TUBAL LIGATION     Family History  Problem Relation Age of Onset  . Diabetes Mother   . Hyperlipidemia Mother   . Hypertension Mother   . Anxiety disorder Mother   . Depression Mother   . Drug abuse Mother   . Stroke Mother        x3  . Prostate cancer Father   . Alcohol abuse Father   . Lung cancer Maternal Grandfather        smoked  . Emphysema Maternal Grandfather        smoked  . COPD Maternal Grandmother        never smoked   . Dementia Maternal Grandmother   . Thyroid cancer Paternal Aunt   . Colon cancer Neg Hx   . Rectal cancer Neg Hx   . Stomach cancer Neg Hx   . Migraines Neg Hx    Social History:   reports that she has been smoking cigarettes. She has a 52.50 pack-year smoking history. She has never used smokeless tobacco. She reports that she does not drink alcohol and does not use drugs.  Medications  Current Facility-Administered Medications:  .  sodium chloride flush (NS) 0.9 % injection 3 mL, 3 mL, Intravenous, Once, Ashley Gambler, MD  Current Outpatient Medications:  .  [name] 225 MG/1.5ML SOSY, INJECT 225 MG INTO THE SKIN EVERY 30 DAYS (Patient taking differently: Inject 225 mg into the skin every 30 (thirty) days.), Disp: 1.5 mL, Rfl: 11 .  albuterol (VENTOLIN HFA) 108 (90 Base) MCG/ACT inhaler, INHALE 2 PUFFS INTO THE LUNGS EVERY 6 HOURS AS NEEDED FOR WHEEZING OR SHORTNESS OF BREATH, Disp: 6.7 g, Rfl: 3 .  AQUALANCE LANCETS 30G MISC, , Disp: , Rfl: 98 .  cholecalciferol (VITAMIN D3) 25 MCG (1000 UT) tablet, Take 2,000 Units by mouth daily., Disp: , Rfl:  .  conjugated estrogens (PREMARIN) vaginal cream, Place 1 Applicatorful vaginally daily., Disp: 127.5 g, Rfl: 1 .  Continuous Blood Gluc Receiver (FREESTYLE LIBRE 2 READER) DEVI, 1 Act by Does not apply route daily., Disp: 2 each, Rfl: 5 .  Continuous Blood Gluc Sensor (FREESTYLE LIBRE 2 SENSOR) MISC, 1 Act by Does not apply route daily., Disp: 2 each, Rfl: 5 .  Dulaglutide  (TRULICITY) 3 0000000 SOPN, Inject 3 mg as directed once a week., Disp: 6 mL, Rfl: 1 .  FEROSUL 325 (65 Fe) MG tablet, TAKE 1 TABLET(325 MG) BY MOUTH TWICE DAILY WITH A MEAL, Disp: 180 tablet, Rfl: 1 .  fludrocortisone (FLORINEF) 0.1 MG tablet, Take 1 tablet (0.1 mg total) by mouth daily., Disp: 90 tablet, Rfl: 1 .  formoterol (PERFOROMIST) 20 MCG/2ML nebulizer solution, Take 2 mLs (20 mcg total) by nebulization 2 (two) times daily., Disp: 360 mL, Rfl: 1 .  Insulin Pen Needle 32G X 6 MM MISC, Use daily as directed, Disp: 100 each, Rfl: 1 .  irbesartan (AVAPRO) 150 MG tablet, Take 1 tablet (150 mg total) by mouth daily., Disp: 90 tablet, Rfl: 0 .  Lancet Devices (ADJUSTABLE LANCING DEVICE) MISC, Up to tid testing,  Disp: , Rfl: 98 .  levETIRAcetam (KEPPRA) 500 MG tablet, Take 500 mg by mouth 2 (two) times daily., Disp: , Rfl:  .  metoprolol tartrate (LOPRESSOR) 50 MG tablet, Take 1 tablet (50 mg total) by mouth 2 (two) times daily., Disp: 180 tablet, Rfl: 3 .  pantoprazole (PROTONIX) 40 MG tablet, TAKE 1 TABLET(40 MG) BY MOUTH DAILY (Patient taking differently: Take 40 mg by mouth daily.), Disp: 90 tablet, Rfl: 1 .  potassium chloride SA (KLOR-CON) 20 MEQ tablet, TAKE 1 TABLET(20 MEQ) BY MOUTH TWICE DAILY, Disp: 180 tablet, Rfl: 0 .  promethazine (PHENERGAN) 25 MG tablet, Take 25 mg by mouth as needed for nausea or vomiting., Disp: , Rfl:  .  revefenacin (YUPELRI) 175 MCG/3ML nebulizer solution, Take 3 mLs (175 mcg total) by nebulization daily., Disp: 270 mL, Rfl: 1 .  Rimegepant Sulfate (NURTEC) 75 MG TBDP, Take 75 mg by mouth daily as needed. For migraines. Take as close to onset of migraine as possible. One daily maximum., Disp: 10 tablet, Rfl: 6 .  rosuvastatin (CRESTOR) 20 MG tablet, TAKE 1 TABLET BY MOUTH EVERY DAY, NEED OFFICE VISIT FOR MORE REFILLS (Patient taking differently: Take 20 mg by mouth daily.), Disp: 90 tablet, Rfl: 1 .  thiamine (VITAMIN B-1) 50 MG tablet, Take 1 tablet (50 mg  total) by mouth daily., Disp: 90 tablet, Rfl: 1 .  traZODone (DESYREL) 150 MG tablet, TAKE 1 TABLET(150 MG) BY MOUTH AT BEDTIME (Patient taking differently: Take 150 mg by mouth at bedtime.), Disp: 90 tablet, Rfl: 1 .  venlafaxine XR (EFFEXOR-XR) 150 MG 24 hr capsule, TAKE 1 CAPSULE(150 MG) BY MOUTH DAILY WITH BREAKFAST (Patient taking differently: Take 150 mg by mouth at bedtime.), Disp: 90 capsule, Rfl: 3  Exam: Current vital signs: BP (!) 159/82 (BP Location: Right Arm)   Pulse 65   Resp 15   LMP 02/12/2014   SpO2 100%  Vital signs in last 24 hours:   GENERAL: Awake, alert, in no acute distress Psych: Affect appropriate for situation, calm and cooperative with examination Head: Normocephalic and atraumatic, dry mm EENT: poor dentition, normal conjunctivae LUNGS: Normal respiratory effort. Non-labored breathing CV: Regular rate on telemetry ABDOMEN: Soft, non-tender, non-distended Ext: warm, without obvious deformity  NEURO:  Mental Status: Awake, alert, and oriented to self, place, time, and situation. She is slow to respond to orientation questions but is able to correctly state her age, the month, and year. She is able to provide information about her present illness. Speech is intact, but slowed without dysarthria or aphasia. Naming and repetition intact.  No aphasia or neglect noted.   Cranial Nerves:  II: PERRL 2 mm/brisk. Visual fields full with blurred vision in the left peripheral visual field. III, IV, VI: EOMI without ptosis V: Sensation to left face is decreased to light touch compared to the right face VII: Face is symmetric resting and smiling.  VIII: Hearing is intact to voice IX, X: Palate elevation is symmetric. Phonation normal.  XI: Shoulders shrug symmetrically XII: Tongue protrudes midline without fasciculations.   Motor: 5/5 strength present on the right upper and lower extremities without vertical drift on assessment. Left upper and lower extremities with  3/5 strength with drift to bed before 10 seconds. Tone is normal. Bulk is normal.  Sensation: Decreased sensation to light touch on the left face, arm, and leg. Patient with some neglect of the left with double stimulation.  Coordination: FTN and HKS intact on the right upper and lower extremities,  no ataxia out of proportion to weakness on the left upper and lower extremities.  DTRs: 2+ and symmetric patellae, biceps, and brachioradialis Plantars: Toes downgoing bilaterally Gait: deferred  NIHSS: 1a Level of Conscious.: 0 1b LOC Questions: 0 1c LOC Commands: 0 2 Best Gaze: 0 3 Visual: 0 4 Facial Palsy: 0 5a Motor Arm - left: 2 5b Motor Arm - Right: 0 6a Motor Leg - Left: 2 6b Motor Leg - Right: 0 7 Limb Ataxia: 0 8 Sensory: 1 9 Best Language: 0 10 Dysarthria: 0 11 Extinct. and Inatten.: 1 TOTAL: 6  Labs I have reviewed labs in epic and the results pertinent to this consultation are: CBC    Component Value Date/Time   WBC 8.0 03/20/2021 1614   RBC 4.61 03/20/2021 1614   HGB 13.9 03/20/2021 1633   HCT 41.0 03/20/2021 1633   PLT 308 03/20/2021 1614   MCV 94.6 03/20/2021 1614   MCH 32.3 03/20/2021 1614   MCHC 34.2 03/20/2021 1614   RDW 12.8 03/20/2021 1614   LYMPHSABS 1.6 03/20/2021 1614   MONOABS 0.6 03/20/2021 1614   EOSABS 0.2 03/20/2021 1614   BASOSABS 0.1 03/20/2021 1614   CMP     Component Value Date/Time   NA 141 05/04/2021 1148   NA 142 10/31/2020 1042   K 3.7 05/04/2021 1148   CL 102 05/04/2021 1148   CO2 31 05/04/2021 1148   GLUCOSE 121 (H) 05/04/2021 1148   BUN 11 05/04/2021 1148   BUN 9 10/31/2020 1042   CREATININE 0.73 05/04/2021 1148   CREATININE 0.72 08/22/2020 0839   CALCIUM 9.5 05/04/2021 1148   PROT 6.4 (L) 03/20/2021 1614   ALBUMIN 3.4 (L) 03/20/2021 1614   AST 26 03/20/2021 1614   ALT 24 03/20/2021 1614   ALKPHOS 95 03/20/2021 1614   BILITOT 0.7 03/20/2021 1614   GFRNONAA >60 03/20/2021 1614   GFRNONAA 95 08/22/2020 0839   GFRAA 105  10/31/2020 1042   GFRAA 110 08/22/2020 0839   Lipid Panel     Component Value Date/Time   CHOL 97 03/21/2021 0011   TRIG 78 03/21/2021 0011   HDL 32 (L) 03/21/2021 0011   CHOLHDL 3.0 03/21/2021 0011   VLDL 16 03/21/2021 0011   LDLCALC 49 03/21/2021 0011   LDLDIRECT 152.1 01/19/2013 0905   Lab Results  Component Value Date   HGBA1C 6.6 (A) 05/04/2021    Imaging I have reviewed the images obtained: CT-scan of the brain: 1. No acute abnormality 2. ASPECTS is 10  MRI examination of the brain: No evidence of acute infarct on this limited study  Assessment: 55 y.o. 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 - Examination reveals patient with left-sided weakness and decreased sensation on the left upper and lower extremity. She also complains of blurred vision in the left visual field and is slow to respond to orientation questions without aphasia and an initial NIHSS of 6.  - CT without acute abnormality. STAT MRI obtained for further evaluation without acute intracranial abnormality.  - tPA risk > benefit due to recent tPA and without ischemia / infarct identified on imaging. Patient not a candidate for IR due to low suspicion for LVO. - Presentation DDx may be due to hypertensive emergency with initial blood pressure  230/108 versus known history of PNES / conversion disorder  Impression: Low suspicion for ischemia / infarct  Hypertensive emergency  Recommendations: - No further neuro imaging recommended - No further neurology recommendations at this time. Neurology will be available on an as needed basis for further questions or concerns.   Ashley [name], AGAC-NP [facility] Pager: [phone]   Attending  addendum Patient seen and examined is an acute code stroke for left-sided weakness and numbness along with some aphasia. Agree with the history and physical documented above. I reviewed the chart I also gathered history from the patient. She is a 55 year old with past medical history of hypertension, hyperlipidemia, diabetes, psychogenic nonepileptic seizures on home Keppra, anxiety, functional neurological weakness episode in March 20, 2021 for which she received IV tPA for concern for stroke and in the same admission had concern for seizures and LTM revealing nonepileptic events-brought in today for concern for sudden onset of left sided weakness and difficulty with speech. On my examination, she had clear giveaway left-sided weakness and stuttering speech. Exam inconsistent hence not a candidate for tPA Also exam not consistent with large vessel occlusion. In spite of strong suspicion of this being functional, I did pursue emergent stroke imaging in the form of CT head which was negative for acute process.  I was able to get an MRI of the brain DWI/flair/GRE sequences stat which was negative for an acute stroke. 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 At this point, she has an outpatient neurologist that she follows up with and I would let her continue consistent care with her established outpatient neurologist. I do not have any further recommendations for any inpatient work-up. I have relayed my plan via secure chat to ER APP Ashley [name].  -- Ashley Portland, MD Neurologist [facility] Pager: [phone]

## 2021-05-16 ENCOUNTER — Ambulatory Visit (INDEPENDENT_AMBULATORY_CARE_PROVIDER_SITE_OTHER): Payer: 59 | Admitting: Psychology

## 2021-05-16 ENCOUNTER — Ambulatory Visit: Payer: Self-pay | Admitting: Psychology

## 2021-05-16 DIAGNOSIS — F451 Undifferentiated somatoform disorder: Secondary | ICD-10-CM | POA: Diagnosis not present

## 2021-05-16 DIAGNOSIS — F419 Anxiety disorder, unspecified: Secondary | ICD-10-CM

## 2021-05-24 ENCOUNTER — Encounter (INDEPENDENT_AMBULATORY_CARE_PROVIDER_SITE_OTHER): Payer: Self-pay

## 2021-05-24 ENCOUNTER — Other Ambulatory Visit: Payer: Self-pay | Admitting: Internal Medicine

## 2021-05-24 ENCOUNTER — Telehealth: Payer: Self-pay | Admitting: Internal Medicine

## 2021-05-24 DIAGNOSIS — N644 Mastodynia: Secondary | ICD-10-CM | POA: Insufficient documentation

## 2021-05-24 NOTE — Telephone Encounter (Signed)
   [facility] requesting order for diagnotic mammogram; breast pain

## 2021-05-31 LAB — HM DIABETES EYE EXAM

## 2021-06-01 ENCOUNTER — Telehealth: Payer: Self-pay | Admitting: Internal Medicine

## 2021-06-01 NOTE — Telephone Encounter (Signed)
Patient states her A1C readings have been around 250-350 for the last week. Her last reading was 302. She is wondering if she needs to resume LEVEMIR 100 UNIT/ML injection.   Please advise.

## 2021-06-02 ENCOUNTER — Other Ambulatory Visit: Payer: Self-pay | Admitting: Internal Medicine

## 2021-06-02 DIAGNOSIS — N644 Mastodynia: Secondary | ICD-10-CM

## 2021-06-02 NOTE — Telephone Encounter (Signed)
Called pt, LVM stating that she should resume medication per PCP.

## 2021-06-05 ENCOUNTER — Ambulatory Visit (INDEPENDENT_AMBULATORY_CARE_PROVIDER_SITE_OTHER): Payer: 59 | Admitting: Psychology

## 2021-06-05 DIAGNOSIS — F419 Anxiety disorder, unspecified: Secondary | ICD-10-CM

## 2021-06-05 DIAGNOSIS — F451 Undifferentiated somatoform disorder: Secondary | ICD-10-CM | POA: Diagnosis not present

## 2021-06-13 ENCOUNTER — Ambulatory Visit (INDEPENDENT_AMBULATORY_CARE_PROVIDER_SITE_OTHER): Payer: 59 | Admitting: Internal Medicine

## 2021-06-13 ENCOUNTER — Other Ambulatory Visit: Payer: Self-pay

## 2021-06-13 ENCOUNTER — Encounter: Payer: Self-pay | Admitting: Internal Medicine

## 2021-06-13 VITALS — BP 130/72 | HR 66 | Temp 98.1°F | Ht 62.0 in | Wt 166.8 lb

## 2021-06-13 DIAGNOSIS — E118 Type 2 diabetes mellitus with unspecified complications: Secondary | ICD-10-CM

## 2021-06-13 DIAGNOSIS — Z23 Encounter for immunization: Secondary | ICD-10-CM | POA: Diagnosis not present

## 2021-06-13 DIAGNOSIS — I10 Essential (primary) hypertension: Secondary | ICD-10-CM | POA: Diagnosis not present

## 2021-06-13 DIAGNOSIS — G43711 Chronic migraine without aura, intractable, with status migrainosus: Secondary | ICD-10-CM

## 2021-06-13 MED ORDER — LEVEMIR 100 UNIT/ML ~~LOC~~ SOLN: 35.0000 [IU] | Freq: Every day | SUBCUTANEOUS | 3 refills | Status: DC

## 2021-06-13 MED ORDER — PROMETHAZINE HCL 12.5 MG PO TABS: 12.5000 mg | ORAL_TABLET | Freq: Four times a day (QID) | ORAL | 3 refills | Status: DC | PRN

## 2021-06-13 NOTE — Patient Instructions (Signed)

## 2021-06-13 NOTE — Progress Notes (Signed)
Subjective:  Patient ID: Ashley [name], female    DOB: May 30, 1966  Age: 55 y.o. MRN: [id number]  CC: Follow-up  This visit occurred during the SARS-CoV-2 public health emergency.  Safety protocols were in place, including screening questions prior to the visit, additional usage of staff PPE, and extensive cleaning of exam room while observing appropriate contact time as indicated for disinfecting solutions.    HPI [name] presents for f/up -   She complains that her blood sugars swing from high to low from day-to-day.  She does not have a consistent intake of calories.  She said sometimes she only eats when her husband prepares and gives her food.  She is aware that she should adjust her insulin dosage when her blood sugar is high or low.  She has a migraine headache about every 1 to 2-week.  She requests a refill of promethazine to control the nausea and vomiting associated with migraine headaches.  Outpatient Medications Prior to Visit  Medication Sig Dispense Refill   [name] 225 MG/1.5ML SOSY INJECT 225 MG INTO THE SKIN EVERY 30 DAYS (Patient taking differently: Inject 225 mg into the skin every 30 (thirty) days.) 1.5 mL 11   albuterol (VENTOLIN HFA) 108 (90 Base) MCG/ACT inhaler INHALE 2 PUFFS INTO THE LUNGS EVERY 6 HOURS AS NEEDED FOR WHEEZING OR SHORTNESS OF BREATH 6.7 g 3   AQUALANCE LANCETS 30G MISC   98   cholecalciferol (VITAMIN D3) 25 MCG (1000 UT) tablet Take 2,000 Units by mouth daily.     conjugated estrogens (PREMARIN) vaginal cream Place 1 Applicatorful vaginally daily. 127.5 g 1   Continuous Blood Gluc Receiver (FREESTYLE LIBRE 2 READER) DEVI 1 Act by Does not apply route daily. 2 each 5   Continuous Blood Gluc Sensor (FREESTYLE LIBRE 2 SENSOR) MISC 1 Act by Does not apply route daily. 2 each 5   Dulaglutide (TRULICITY) 3 ZD/6.6YQ SOPN Inject 3 mg as directed once a week. 6 mL 1   FEROSUL 325 (65 Fe) MG tablet TAKE 1 TABLET(325 MG) BY MOUTH TWICE DAILY WITH A MEAL 180  tablet 1   fludrocortisone (FLORINEF) 0.1 MG tablet Take 1 tablet (0.1 mg total) by mouth daily. 90 tablet 1   formoterol (PERFOROMIST) 20 MCG/2ML nebulizer solution Take 2 mLs (20 mcg total) by nebulization 2 (two) times daily. 360 mL 1   Insulin Pen Needle 32G X 6 MM MISC Use daily as directed 100 each 1   irbesartan (AVAPRO) 150 MG tablet Take 1 tablet (150 mg total) by mouth daily. 90 tablet 0   Lancet Devices (ADJUSTABLE LANCING DEVICE) MISC Up to tid testing  98   levETIRAcetam (KEPPRA) 500 MG tablet Take 500 mg by mouth 2 (two) times daily.     pantoprazole (PROTONIX) 40 MG tablet TAKE 1 TABLET(40 MG) BY MOUTH DAILY (Patient taking differently: Take 40 mg by mouth daily.) 90 tablet 1   potassium chloride SA (KLOR-CON) 20 MEQ tablet TAKE 1 TABLET(20 MEQ) BY MOUTH TWICE DAILY 180 tablet 0   revefenacin (YUPELRI) 175 MCG/3ML nebulizer solution Take 3 mLs (175 mcg total) by nebulization daily. 270 mL 1   Rimegepant Sulfate (NURTEC) 75 MG TBDP Take 75 mg by mouth daily as needed. For migraines. Take as close to onset of migraine as possible. One daily maximum. 10 tablet 6   rosuvastatin (CRESTOR) 20 MG tablet TAKE 1 TABLET BY MOUTH EVERY DAY, NEED OFFICE VISIT FOR MORE REFILLS (Patient taking differently: Take 20 mg by  mouth daily.) 90 tablet 1   thiamine (VITAMIN B-1) 50 MG tablet Take 1 tablet (50 mg total) by mouth daily. 90 tablet 1   traZODone (DESYREL) 150 MG tablet TAKE 1 TABLET(150 MG) BY MOUTH AT BEDTIME (Patient taking differently: Take 150 mg by mouth at bedtime.) 90 tablet 1   venlafaxine XR (EFFEXOR-XR) 150 MG 24 hr capsule TAKE 1 CAPSULE(150 MG) BY MOUTH DAILY WITH BREAKFAST (Patient taking differently: Take 150 mg by mouth at bedtime.) 90 capsule 3   LEVEMIR 100 UNIT/ML injection Inject 30 Units into the skin at bedtime.     promethazine (PHENERGAN) 25 MG tablet Take 25 mg by mouth as needed for nausea or vomiting.     metoprolol tartrate (LOPRESSOR) 50 MG tablet Take 1 tablet (50  mg total) by mouth 2 (two) times daily. 180 tablet 3   No facility-administered medications prior to visit.    ROS Review of Systems  Constitutional:  Negative for diaphoresis, fatigue and unexpected weight change.  HENT: Negative.    Eyes: Negative.   Respiratory:  Negative for cough, chest tightness, shortness of breath and wheezing.   Cardiovascular:  Negative for chest pain, palpitations and leg swelling.  Gastrointestinal:  Positive for nausea. Negative for abdominal pain, constipation, diarrhea and vomiting.  Endocrine: Negative.  Negative for polydipsia, polyphagia and polyuria.  Genitourinary: Negative.  Negative for difficulty urinating.  Musculoskeletal:  Negative for arthralgias and myalgias.  Skin: Negative.  Negative for color change and pallor.  Neurological:  Positive for headaches. Negative for dizziness and numbness.  Hematological:  Negative for adenopathy. Does not bruise/bleed easily.  Psychiatric/Behavioral: Negative.     Objective:  BP 130/72 (BP Location: Right Arm, Patient Position: Sitting, Cuff Size: Large)   Pulse 66   Temp 98.1 F (36.7 C) (Oral)   Ht 5\' 2"  (1.575 m)   Wt 166 lb 12.8 oz (75.7 kg)   LMP 02/12/2014   SpO2 98%   BMI 30.51 kg/m   BP Readings from Last 3 Encounters:  06/13/21 130/72  05/09/21 (!) 158/77  05/04/21 138/86    Wt Readings from Last 3 Encounters:  06/13/21 166 lb 12.8 oz (75.7 kg)  05/04/21 173 lb (78.5 kg)  04/13/21 169 lb 3.2 oz (76.7 kg)    Physical Exam Vitals reviewed.  Constitutional:      Appearance: Normal appearance. She is not ill-appearing.  HENT:     Mouth/Throat:     Mouth: Mucous membranes are moist.  Eyes:     Conjunctiva/sclera: Conjunctivae normal.  Cardiovascular:     Rate and Rhythm: Normal rate and regular rhythm.     Heart sounds: No murmur heard. Pulmonary:     Effort: Pulmonary effort is normal.     Breath sounds: No stridor. No wheezing, rhonchi or rales.  Abdominal:     General:  Abdomen is flat.     Palpations: There is no mass.     Tenderness: There is no abdominal tenderness. There is no guarding.  Musculoskeletal:        General: Normal range of motion.     Cervical back: Neck supple.     Right lower leg: No edema.     Left lower leg: No edema.  Lymphadenopathy:     Cervical: No cervical adenopathy.  Skin:    General: Skin is warm and dry.  Neurological:     General: No focal deficit present.     Mental Status: She is alert.  Psychiatric:  Mood and Affect: Mood normal.        Behavior: Behavior normal.    Lab Results  Component Value Date   WBC 8.5 05/09/2021   HGB 13.6 05/09/2021   HCT 40.0 05/09/2021   PLT 321 05/09/2021   GLUCOSE 151 (H) 05/09/2021   CHOL 97 03/21/2021   TRIG 78 03/21/2021   HDL 32 (L) 03/21/2021   LDLDIRECT 152.1 01/19/2013   LDLCALC 49 03/21/2021   ALT 18 05/09/2021   AST 17 05/09/2021   NA 142 05/09/2021   K 3.1 (L) 05/09/2021   CL 101 05/09/2021   CREATININE 0.60 05/09/2021   BUN 9 05/09/2021   CO2 29 05/09/2021   TSH 1.670 10/31/2020   INR 1.0 05/09/2021   HGBA1C 6.6 (A) 05/04/2021   MICROALBUR <0.7 05/09/2020    MR BRAIN WO CONTRAST  Result Date: 05/09/2021 CLINICAL DATA:  Neuro deficit, acute stroke suspected. EXAM: MRI HEAD WITHOUT CONTRAST TECHNIQUE: Multiplanar, multiecho pulse sequences of the brain and surrounding structures were obtained without intravenous contrast. COMPARISON:  None. FINDINGS: Per Dr. [name] request, limited study performed with only DWI/ADC, axial FLAIR, and GRE performed. Within this limitation: Brain: No acute infarction, hemorrhage, hydrocephalus, extra-axial collection or mass lesion. Sinuses/Orbits: Sinuses are clear.  Unremarkable orbits. Other: No sizable mastoid effusions. IMPRESSION: No evidence of acute infarct on this limited study (as detailed above). Electronically Signed   By: Margaretha Sheffield MD   On: 05/09/2021 16:33   CT HEAD CODE STROKE WO CONTRAST  Result  Date: 05/09/2021 CLINICAL DATA:  Code stroke.  Acute neuro deficit. EXAM: CT HEAD WITHOUT CONTRAST TECHNIQUE: Contiguous axial images were obtained from the base of the skull through the vertex without intravenous contrast. COMPARISON:  CT head 03/21/2021 FINDINGS: Brain: No evidence of acute infarction, hemorrhage, hydrocephalus, extra-axial collection or mass lesion/mass effect. Vascular: Negative for hyperdense vessel Skull: 12 mm low-density lesion in the left medial parietal bone unchanged. Benign appearance. Sinuses/Orbits: Paranasal sinuses clear. Negative orbit. Left cataract extraction Other: None ASPECTS ([place] Stroke Program Early CT Score) - Ganglionic level infarction (caudate, lentiform nuclei, internal capsule, insula, M1-M3 cortex): 7 - Supraganglionic infarction (M4-M6 cortex): 3 Total score (0-10 with 10 being normal): 10 IMPRESSION: 1. No acute abnormality 2. ASPECTS is 10 3. Code stroke imaging results were communicated on 05/09/2021 at 3:54 pm to provider Rory Percy via text page Electronically Signed   By: Franchot Gallo M.D.   On: 05/09/2021 15:54    Assessment & Plan:   Chiyeko was seen today for follow-up.  Diagnoses and all orders for this visit:  Chronic migraine without aura, with intractable migraine, so stated, with status migrainosus -     promethazine (PHENERGAN) 12.5 MG tablet; Take 1 tablet (12.5 mg total) by mouth every 6 (six) hours as needed for nausea or vomiting.  Essential hypertension- Her BP is well controlled.  Diabetes mellitus with complication ([place])- She will adjust her basal insulin dose according to caloric intake. -     LEVEMIR 100 UNIT/ML injection; Inject 0.35 mLs (35 Units total) into the skin at bedtime. -     Amb Referral to Nutrition and Diabetic Education  Other orders -     Varicella-zoster vaccine IM (Shingrix)  I have discontinued Halayna B. Wofford's promethazine. I have also changed her Levemir. Additionally, I am having her start on promethazine.  Lastly, I am having her maintain her Adjustable Lancing Device, AquaLance Lancets 30G, Insulin Pen Needle, cholecalciferol, [name], levETIRAcetam, traZODone, rosuvastatin, venlafaxine XR, pantoprazole, metoprolol  tartrate, FreeStyle Libre 2 Reader, thiamine, fludrocortisone, FeroSul, irbesartan, FreeStyle Libre 2 Sensor, Nurtec, potassium chloride SA, revefenacin, formoterol, conjugated estrogens, Trulicity, and albuterol.  Meds ordered this encounter  Medications   promethazine (PHENERGAN) 12.5 MG tablet    Sig: Take 1 tablet (12.5 mg total) by mouth every 6 (six) hours as needed for nausea or vomiting.    Dispense:  30 tablet    Refill:  3   LEVEMIR 100 UNIT/ML injection    Sig: Inject 0.35 mLs (35 Units total) into the skin at bedtime.    Dispense:  10 mL    Refill:  3     Follow-up: Return in about 4 months (around 10/13/2021).  Scarlette Calico, MD

## 2021-06-20 ENCOUNTER — Other Ambulatory Visit: Payer: Self-pay

## 2021-06-20 NOTE — Patient Outreach (Signed)
[facility]) Care Management  06/20/2021  ALZORA HA 1966/08/14 [id number]   Telephone outreach to patient to obtain mRS was successfully completed. MRS= [place] Management Assistant  [phone]

## 2021-06-29 ENCOUNTER — Other Ambulatory Visit: Payer: Self-pay | Admitting: Internal Medicine

## 2021-06-29 DIAGNOSIS — F411 Generalized anxiety disorder: Secondary | ICD-10-CM

## 2021-07-06 ENCOUNTER — Other Ambulatory Visit: Payer: Self-pay | Admitting: Internal Medicine

## 2021-07-06 DIAGNOSIS — E118 Type 2 diabetes mellitus with unspecified complications: Secondary | ICD-10-CM

## 2021-07-06 DIAGNOSIS — E876 Hypokalemia: Secondary | ICD-10-CM

## 2021-07-06 DIAGNOSIS — I10 Essential (primary) hypertension: Secondary | ICD-10-CM

## 2021-07-06 DIAGNOSIS — E785 Hyperlipidemia, unspecified: Secondary | ICD-10-CM

## 2021-07-06 DIAGNOSIS — I739 Peripheral vascular disease, unspecified: Secondary | ICD-10-CM

## 2021-07-22 ENCOUNTER — Other Ambulatory Visit: Payer: Self-pay | Admitting: Internal Medicine

## 2021-07-22 DIAGNOSIS — K2100 Gastro-esophageal reflux disease with esophagitis, without bleeding: Secondary | ICD-10-CM

## 2021-07-24 ENCOUNTER — Other Ambulatory Visit: Payer: Self-pay

## 2021-07-24 ENCOUNTER — Ambulatory Visit
Admission: RE | Admit: 2021-07-24 | Discharge: 2021-07-24 | Disposition: A | Discharge status: Home / Self Care | Payer: 59 | Source: Ambulatory Visit | Attending: Internal Medicine | Admitting: Internal Medicine

## 2021-07-24 ENCOUNTER — Ambulatory Visit: Payer: 59

## 2021-07-24 DIAGNOSIS — N644 Mastodynia: Secondary | ICD-10-CM

## 2021-07-31 ENCOUNTER — Encounter: Payer: 59 | Attending: Internal Medicine | Admitting: Registered"

## 2021-07-31 ENCOUNTER — Encounter: Payer: Self-pay | Admitting: Registered"

## 2021-07-31 DIAGNOSIS — E118 Type 2 diabetes mellitus with unspecified complications: Secondary | ICD-10-CM | POA: Diagnosis not present

## 2021-07-31 NOTE — Patient Instructions (Signed)
Exercise: 30 min after eating dinner; work up to 1 hour by increasing your exercise time by adding 5-10 min per day. Sweet tea: use 1/2 c less sugar when making tea, continue cutting back by 1/2 c each month.

## 2021-07-31 NOTE — Progress Notes (Signed)
Virtual Visit via Video Note  I connected with Ashley [name] on 08/02/21 at  2:00 PM EDT by a video enabled telemedicine application and verified that I am speaking with the correct person using two identifiers.  Location: Patient: is at home Provider: [facility]    I discussed the limitations of evaluation and management by telemedicine and the availability of in person appointments. The patient expressed understanding and agreed to proceed.   Diabetes Self-Management Education  Visit Type: First/Initial  Appt. Start Time: 1410 Appt. End Time: [id number]  08/02/2021  [name], identified by name and date of birth, is a 55 y.o. female with a diagnosis of Diabetes: Type 2.   ASSESSMENT  Last menstrual period 02/12/2014. There is no height or weight on file to calculate BMI.  A1c 6.6%  Diabetes Medications: Levemir 35 units qhs if in normal range 40 units if over [id number]; Trulicy (GLP-1 RA)  SMBG: CGM Freestyle Libre; FBS 116 mg/dL; check randomly in the afternoon 204 now. PPBG after dinner Friday 306, 246, 187  Patient states she doesn't have an appetite, and can go 2-3 days without eating, but husband makes sure she will at least eat dinner. Pt states because she knows she should have breakfast will try to al least have a protein shakes. Pt states her appetite has been this way about a year and states she also has been using [place] about 1 year.  Pt states she drinks mostly sweet tea and uses 2 1/2 c sugar to make it. Pt states she makes a separate container for her husband because he likes it less sweet.  Pt reports her blood sugar was doing well and her MD took Levemir off her medications for 4 weeks, but when her blood sugar started averaging 300 mg/dL, the medication was restarted.  Patient states she can feel when her blood sugar is high, symptoms include frequent urination and feels "wobbly in the head."  Pt reports she had a small stroke in March. Pt states she can not drive due to  poor balance.   Diabetes Self-Management Education - 07/31/21 1418       Visit Information   Visit Type First/Initial      Initial Visit   Diabetes Type Type 2    Are you taking your medications as prescribed? Yes    Date Diagnosed 5 yrs ago      Psychosocial Assessment   How often do you need to have someone help you when you read instructions, pamphlets, or other written materials from your doctor or pharmacy? 3 - Sometimes    What is the last grade level you completed in school? associates in accounting      Complications   Last HgB A1C per patient/outside source 6.6 %    How often do you check your blood sugar? 1-2 times/day    Fasting Blood glucose range (mg/dL) 70-129    Postprandial Blood glucose range (mg/dL) >200;180-200      Dietary Intake   Breakfast glucose protein shake    Dinner Friday Hamburger, potatoes, pinto beans on Sunday pizza    Beverage(s) 64 water, 32 oz sweet tea, 16 oz coffee a little cream and sugar      Exercise   Exercise Type Other (comment)   total gym   How many days per week to you exercise? 5    How many minutes per day do you exercise? 30    Total minutes per week of exercise 150  Patient Education   Previous Diabetes Education Yes (please comment)   patient doesn't remember, but record in system   Nutrition management  Other (comment)   effect of sweetened beverages and concentrated sweets   Physical activity and exercise  Role of exercise on diabetes management, blood pressure control and cardiac health.      Individualized Goals (developed by patient)   Nutrition Other (comment)   decrease sugar in sweet tea   Physical Activity Exercise 5-7 days per week      Outcomes   Expected Outcomes Demonstrated interest in learning. Expect positive outcomes    Future DMSE 4-6 wks    Program Status Not Completed             Individualized Plan for Diabetes Self-Management Training:   Learning Objective:  Patient will have a greater  understanding of diabetes self-management. Patient education plan is to attend individual and/or group sessions per assessed needs and concerns.   Patient Instructions  Exercise: 30 min after eating dinner; work up to 1 hour by increasing your exercise time by adding 5-10 min per day. Sweet tea: use 1/2 c less sugar when making tea, continue cutting back by 1/2 c each month.  Expected Outcomes:  Demonstrated interest in learning. Expect positive outcomes  Education material provided: none  If problems or questions, patient to contact team via:  Phone and Mychart  Future DSME appointment: 4-6 wks  I discussed the assessment and treatment plan with the patient. The patient was provided an opportunity to ask questions and all were answered. The patient agreed with the plan and demonstrated an understanding of the instructions.   The patient was advised to call back or seek an in-person evaluation if the symptoms worsen or if the condition fails to improve as anticipated.  I provided 56 minutes of non-face-to-face time during this encounter.   Christella Hartigan, RD, CDCES

## 2021-08-08 ENCOUNTER — Telehealth: Payer: Self-pay

## 2021-08-08 NOTE — Telephone Encounter (Signed)
Pt has called with complaints of chest pains upon exhalation which started at 1:30pm today 08/08/2021. I was able to transfer her to team health.

## 2021-08-09 ENCOUNTER — Telehealth: Payer: Self-pay | Admitting: Emergency Medicine

## 2021-08-10 ENCOUNTER — Other Ambulatory Visit: Payer: Self-pay | Admitting: Internal Medicine

## 2021-08-10 DIAGNOSIS — E118 Type 2 diabetes mellitus with unspecified complications: Secondary | ICD-10-CM

## 2021-08-10 DIAGNOSIS — I10 Essential (primary) hypertension: Secondary | ICD-10-CM

## 2021-08-15 ENCOUNTER — Other Ambulatory Visit: Payer: Self-pay

## 2021-08-15 ENCOUNTER — Ambulatory Visit (HOSPITAL_COMMUNITY)
Admission: RE | Admit: 2021-08-15 | Discharge: 2021-08-15 | Disposition: A | Discharge status: Home / Self Care | Payer: 59 | Source: Ambulatory Visit | Attending: Cardiovascular Disease | Admitting: Cardiovascular Disease

## 2021-08-15 DIAGNOSIS — I6523 Occlusion and stenosis of bilateral carotid arteries: Secondary | ICD-10-CM | POA: Diagnosis present

## 2021-08-22 ENCOUNTER — Other Ambulatory Visit: Payer: Self-pay | Admitting: Internal Medicine

## 2021-09-04 ENCOUNTER — Telehealth: Payer: 59 | Admitting: Registered"

## 2021-09-12 ENCOUNTER — Telehealth: Payer: Self-pay

## 2021-09-12 NOTE — Telephone Encounter (Signed)
Please advise as the pt has stated she was advised to call her PCP in regards to the finding of a large Lymph node seen on her [place] on her Carotid artery that was accidental find.  **Pt is worried and needs to know what should be her next steps going forward.  Pt can be reached at [phone].

## 2021-09-12 NOTE — Telephone Encounter (Signed)
I was able to schedule pt with Dr. Ronnald Ramp for tomorrow at 10:00am

## 2021-09-13 ENCOUNTER — Ambulatory Visit (INDEPENDENT_AMBULATORY_CARE_PROVIDER_SITE_OTHER): Payer: 59

## 2021-09-13 ENCOUNTER — Other Ambulatory Visit: Payer: Self-pay

## 2021-09-13 ENCOUNTER — Encounter: Payer: Self-pay | Admitting: Internal Medicine

## 2021-09-13 ENCOUNTER — Ambulatory Visit (INDEPENDENT_AMBULATORY_CARE_PROVIDER_SITE_OTHER): Payer: 59 | Admitting: Internal Medicine

## 2021-09-13 VITALS — BP 132/84 | HR 71 | Temp 98.5°F | Resp 16 | Ht 62.0 in | Wt 162.0 lb

## 2021-09-13 DIAGNOSIS — R058 Other specified cough: Secondary | ICD-10-CM | POA: Insufficient documentation

## 2021-09-13 DIAGNOSIS — J189 Pneumonia, unspecified organism: Secondary | ICD-10-CM | POA: Diagnosis not present

## 2021-09-13 MED ORDER — MOXIFLOXACIN HCL 400 MG PO TABS: 400.0000 mg | ORAL_TABLET | Freq: Every day | ORAL | 0 refills | Status: AC

## 2021-09-13 MED ORDER — HYDROCODONE BIT-HOMATROP MBR 5-1.5 MG/5ML PO SOLN: 5.0000 mL | Freq: Three times a day (TID) | ORAL | 0 refills | Status: DC | PRN

## 2021-09-13 NOTE — Progress Notes (Signed)
Subjective:  Patient ID: Ashley [name], female    DOB: [birth year]  Age: 55 y.o. MRN: [id number]  CC: Cough  This visit occurred during the SARS-CoV-2 public health emergency.  Safety protocols were in place, including screening questions prior to the visit, additional usage of staff PPE, and extensive cleaning of exam room while observing appropriate contact time as indicated for disinfecting solutions.    HPI Ashley [name] presents for f/up -  She complains of a 2 day hx of cough productive of yellow phlegm.  Outpatient Medications Prior to Visit  Medication Sig Dispense Refill   [name] 225 MG/1.5ML SOSY INJECT 225 MG INTO THE SKIN EVERY 30 DAYS (Patient taking differently: Inject 225 mg into the skin every 30 (thirty) days.) 1.5 mL 11   albuterol (VENTOLIN HFA) 108 (90 Base) MCG/ACT inhaler INHALE 2 PUFFS INTO THE LUNGS EVERY 6 HOURS AS NEEDED FOR WHEEZING OR SHORTNESS OF BREATH 6.7 g 3   AQUALANCE LANCETS 30G MISC   98   cholecalciferol (VITAMIN D3) 25 MCG (1000 UT) tablet Take 2,000 Units by mouth daily.     conjugated estrogens (PREMARIN) vaginal cream Place 1 Applicatorful vaginally daily. 127.5 g 1   Continuous Blood Gluc Receiver (FREESTYLE LIBRE 2 READER) DEVI 1 Act by Does not apply route daily. 2 each 5   Continuous Blood Gluc Sensor (FREESTYLE LIBRE 2 SENSOR) MISC 1 Act by Does not apply route daily. 2 each 5   Dulaglutide (TRULICITY) 3 VH/8.4ON SOPN Inject 3 mg as directed once a week. 6 mL 1   FEROSUL 325 (65 Fe) MG tablet TAKE 1 TABLET(325 MG) BY MOUTH TWICE DAILY WITH A MEAL 180 tablet 1   fludrocortisone (FLORINEF) 0.1 MG tablet Take 1 tablet (0.1 mg total) by mouth daily. 90 tablet 1   formoterol (PERFOROMIST) 20 MCG/2ML nebulizer solution Take 2 mLs (20 mcg total) by nebulization 2 (two) times daily. 360 mL 1   Insulin Pen Needle 32G X 6 MM MISC Use daily as directed 100 each 1   irbesartan (AVAPRO) 150 MG tablet TAKE 1 TABLET(150 MG) BY MOUTH DAILY 90 tablet 0   Lancet  Devices (ADJUSTABLE LANCING DEVICE) MISC Up to tid testing  98   LEVEMIR 100 UNIT/ML injection Inject 0.35 mLs (35 Units total) into the skin at bedtime. 10 mL 3   levETIRAcetam (KEPPRA) 500 MG tablet Take 500 mg by mouth 2 (two) times daily.     pantoprazole (PROTONIX) 40 MG tablet TAKE 1 TABLET(40 MG) BY MOUTH DAILY 90 tablet 1   potassium chloride SA (KLOR-CON) 20 MEQ tablet TAKE 1 TABLET(20 MEQ) BY MOUTH TWICE DAILY 180 tablet 0   promethazine (PHENERGAN) 12.5 MG tablet Take 1 tablet (12.5 mg total) by mouth every 6 (six) hours as needed for nausea or vomiting. 30 tablet 3   revefenacin (YUPELRI) 175 MCG/3ML nebulizer solution Take 3 mLs (175 mcg total) by nebulization daily. 270 mL 1   Rimegepant Sulfate (NURTEC) 75 MG TBDP Take 75 mg by mouth daily as needed. For migraines. Take as close to onset of migraine as possible. One daily maximum. 10 tablet 6   rosuvastatin (CRESTOR) 20 MG tablet TAKE 1 TABLET BY MOUTH EVERY DAY 90 tablet 1   thiamine (VITAMIN B-1) 50 MG tablet Take 1 tablet (50 mg total) by mouth daily. 90 tablet 1   traZODone (DESYREL) 150 MG tablet Take 1 tablet (150 mg total) by mouth at bedtime. 90 tablet 1   venlafaxine XR (EFFEXOR-XR) 150  MG 24 hr capsule TAKE 1 CAPSULE(150 MG) BY MOUTH DAILY WITH BREAKFAST (Patient taking differently: Take 150 mg by mouth at bedtime.) 90 capsule 3   metoprolol tartrate (LOPRESSOR) 50 MG tablet Take 1 tablet (50 mg total) by mouth 2 (two) times daily. 180 tablet 3   No facility-administered medications prior to visit.    ROS Review of Systems  Constitutional:  Negative for chills, diaphoresis, fatigue and fever.  HENT: Negative.  Negative for sore throat.   Respiratory:  Positive for cough. Negative for chest tightness, shortness of breath and wheezing.   Cardiovascular:  Negative for chest pain, palpitations and leg swelling.  Gastrointestinal:  Negative for abdominal pain, constipation, diarrhea, nausea and vomiting.  Genitourinary:  Negative.   Musculoskeletal: Negative.   Skin: Negative.   Neurological:  Negative for dizziness, weakness, light-headedness and headaches.  Hematological:  Negative for adenopathy. Does not bruise/bleed easily.  Psychiatric/Behavioral: Negative.     Objective:  BP 132/84 (BP Location: Right Arm, Patient Position: Sitting, Cuff Size: Large)   Pulse 71   Temp 98.5 F (36.9 C) (Oral)   Resp 16   Ht 5\' 2"  (1.575 m)   Wt 162 lb (73.5 kg)   LMP 02/12/2014   SpO2 94%   BMI 29.63 kg/m   BP Readings from Last 3 Encounters:  09/13/21 132/84  06/13/21 130/72  05/09/21 (!) 158/77    Wt Readings from Last 3 Encounters:  09/13/21 162 lb (73.5 kg)  06/13/21 166 lb 12.8 oz (75.7 kg)  05/04/21 173 lb (78.5 kg)    Physical Exam Vitals reviewed.  Constitutional:      Appearance: She is not ill-appearing.  HENT:     Nose: Nose normal.     Mouth/Throat:     Mouth: Mucous membranes are moist.  Eyes:     General: No scleral icterus.    Conjunctiva/sclera: Conjunctivae normal.  Neck:     Thyroid: No thyroid mass or thyromegaly.  Cardiovascular:     Rate and Rhythm: Normal rate and regular rhythm.  Pulmonary:     Effort: Pulmonary effort is normal.     Breath sounds: No stridor. No wheezing, rhonchi or rales.  Abdominal:     General: Abdomen is flat.     Palpations: There is no mass.     Tenderness: There is no abdominal tenderness. There is no right CVA tenderness or guarding.     Hernia: No hernia is present.  Musculoskeletal:        General: Normal range of motion.     Cervical back: Neck supple.     Right lower leg: No edema.     Left lower leg: No edema.  Lymphadenopathy:     Head:     Right side of head: No submental, submandibular, tonsillar, preauricular, posterior auricular or occipital adenopathy.     Left side of head: No submental, submandibular, tonsillar, preauricular, posterior auricular or occipital adenopathy.     Cervical: No cervical adenopathy.     Right  cervical: No superficial, deep or posterior cervical adenopathy.    Left cervical: No superficial, deep or posterior cervical adenopathy.     Upper Body:     Right upper body: No supraclavicular, axillary, pectoral or epitrochlear adenopathy.     Left upper body: No supraclavicular, axillary, pectoral or epitrochlear adenopathy.  Skin:    General: Skin is warm and dry.     Findings: No rash.  Neurological:     General: No focal deficit present.  Mental Status: She is alert.  Psychiatric:        Mood and Affect: Mood normal.        Behavior: Behavior normal.    Lab Results  Component Value Date   WBC 8.5 05/09/2021   HGB 13.6 05/09/2021   HCT 40.0 05/09/2021   PLT 321 05/09/2021   GLUCOSE 151 (H) 05/09/2021   CHOL 97 03/21/2021   TRIG 78 03/21/2021   HDL 32 (L) 03/21/2021   LDLDIRECT 152.1 01/19/2013   LDLCALC 49 03/21/2021   ALT 18 05/09/2021   AST 17 05/09/2021   NA 142 05/09/2021   K 3.1 (L) 05/09/2021   CL 101 05/09/2021   CREATININE 0.60 05/09/2021   BUN 9 05/09/2021   CO2 29 05/09/2021   TSH 1.670 10/31/2020   INR 1.0 05/09/2021   HGBA1C 6.6 (A) 05/04/2021   MICROALBUR <0.7 05/09/2020    VAS US CAROTID  Result Date: 08/16/2021 Carotid Arterial Duplex Study Patient Name:  Ashley [name]  Date of Exam:   08/15/2021 Medical Rec #: [id number]    Accession #:    [id number] Date of Birth: April 10, 1966    Patient Gender: F Patient Age:   30 years Exam Location:  [facility] Procedure:      VAS US CAROTID Referring Phys: Suanne Marker BARRETT --------------------------------------------------------------------------------  Indications:       Carotid artery disease and patient has had at least 12                    syncopal episodes since last year. She reports with each                    episode she experiences worsening headaches, dizziness and                    blurred and double vision. She states she still has some left                    facial numbness but it is not as bad as it  use to be. She                    denies any other cerebrovascular symptoms. Risk Factors:      Hypertension, hyperlipidemia, Diabetes, current smoker, prior                    CVA. Comparison Study:  In 07/2020, a carotid duplex showed velocities of 89/30 cm/s                    in the RICA and 37/16 cm/s in the LICA. Performing Technologist: Sharlett Iles RVT  Examination Guidelines: A complete evaluation includes B-mode imaging, spectral Doppler, color Doppler, and power Doppler as needed of all accessible portions of each vessel. Bilateral testing is considered an integral part of a complete examination. Limited examinations for reoccurring indications may be performed as noted.  Right Carotid Findings: +----------+--------+--------+--------+------------------+--------+           PSV cm/sEDV cm/sStenosisPlaque DescriptionComments +----------+--------+--------+--------+------------------+--------+ CCA Prox  107     17                                         +----------+--------+--------+--------+------------------+--------+ CCA Distal70      18                                         +----------+--------+--------+--------+------------------+--------+  ICA Prox  71      23      1-39%   heterogenous               +----------+--------+--------+--------+------------------+--------+ ICA Mid   78      29                                         +----------+--------+--------+--------+------------------+--------+ ICA Distal80      29                                         +----------+--------+--------+--------+------------------+--------+ ECA       90      13                                         +----------+--------+--------+--------+------------------+--------+ +----------+--------+-------+----------------------+-------------------+           PSV cm/sEDV cmsDescribe              Arm Pressure (mmHG)  +----------+--------+-------+----------------------+-------------------+ [id number]            Stenotic and Turbulent158                 +----------+--------+-------+----------------------+-------------------+ +---------+--------+--+--------+--+---------+ VertebralPSV cm/s40EDV cm/s14Antegrade +---------+--------+--+--------+--+---------+  Left Carotid Findings: +----------+--------+--------+--------+--------------------------+--------+           PSV cm/sEDV cm/sStenosisPlaque Description        Comments +----------+--------+--------+--------+--------------------------+--------+ CCA Prox  116     23                                                 +----------+--------+--------+--------+--------------------------+--------+ CCA Distal62      20                                                 +----------+--------+--------+--------+--------------------------+--------+ ICA Prox  72      28      1-39%   heterogenous and irregular         +----------+--------+--------+--------+--------------------------+--------+ ICA Mid   74      26                                                 +----------+--------+--------+--------+--------------------------+--------+ ICA Distal80      27                                                 +----------+--------+--------+--------+--------------------------+--------+ ECA       86      12              heterogenous                       +----------+--------+--------+--------+--------------------------+--------+ +----------+--------+--------+----------------+-------------------+  PSV cm/sEDV cm/sDescribe        Arm Pressure (mmHG) +----------+--------+--------+----------------+-------------------+ Subclavian171             Multiphasic, [id number]                 +----------+--------+--------+----------------+-------------------+ +---------+--------+--+--------+--+---------+ VertebralPSV cm/s43EDV  cm/s15Antegrade +---------+--------+--+--------+--+---------+   Summary: Right Carotid: Velocities in the right ICA are consistent with a 1-39% stenosis.                Stable RICA velocities compared to prior exam. Left Carotid: Velocities in the left ICA are consistent with a 1-39% stenosis.               Stable LICA velocities compared to prior exam. Vertebrals:  Bilateral vertebral arteries demonstrate antegrade flow. Subclavians: Right subclavian artery was stenotic. Right subclavian artery flow              was disturbed. Normal flow hemodynamics were seen in the left              subclavian artery. '  Incidental findings: Enlarged lymph node anterior to the left external carotid artery, measuring 1.4 x .46 x 1.0 cm. *See table(s) above for measurements and observations.  Electronically signed by Carlyle Dolly MD on 08/16/2021 at 4:23:00 PM.    Final     DG Chest 2 View  Result Date: 09/13/2021 CLINICAL DATA:  55 year old female with cough, chest pain, shortness of breath for 2 days. Smoker. EXAM: CHEST - 2 VIEW COMPARISON:  Chest radiographs 05/04/2021 and earlier. FINDINGS: Stable lung volumes and mediastinal contours, within normal limits. Visualized tracheal air column is within normal limits. No pneumothorax, pulmonary edema, pleural effusion or consolidation. But there is mild new curvilinear opacity in the right lower lobe. Elsewhere chronic increased interstitial markings in both lungs appear stable, basilar predominant and probably smoking related. No acute osseous abnormality identified. Negative visible bowel gas pattern. IMPRESSION: Mild acute right lower lobe streaky opacity superimposed on chronic lung changes. This is nonspecific but suspicious for acute infectious exacerbation. Electronically Signed   By: Genevie Ann M.D.   On: 09/13/2021 11:06     Assessment & Plan:   Ashley [name] was seen today for cough.  Diagnoses and all orders for this visit:  Cough productive of clear sputum- COVID  Ag is negative. CXR is + for RLL PNA, will treat with a FQ since she is PCN allergic.  -     DG Chest 2 View; Future -     Novel Coronavirus, NAA ([facility]); Future -     HYDROcodone bit-homatropine (HYCODAN) 5-1.5 MG/5ML syrup; Take 5 mLs by mouth every 8 (eight) hours as needed for cough. -     Novel Coronavirus, NAA ([facility])  Pneumonia of right lower lobe due to infectious organism -     moxifloxacin (AVELOX) 400 MG tablet; Take 1 tablet (400 mg total) by mouth daily for 7 days. -     HYDROcodone bit-homatropine (HYCODAN) 5-1.5 MG/5ML syrup; Take 5 mLs by mouth every 8 (eight) hours as needed for cough.  I am having Ashley [name] start on moxifloxacin and HYDROcodone bit-homatropine. I am also having her maintain her Adjustable Lancing Device, AquaLance Lancets 30G, Insulin Pen Needle, cholecalciferol, [name], levETIRAcetam, venlafaxine XR, metoprolol tartrate, FreeStyle Libre 2 Reader, thiamine, fludrocortisone, FeroSul, FreeStyle Libre 2 Sensor, Nurtec, revefenacin, formoterol, conjugated estrogens, Trulicity, promethazine, Levemir, traZODone, rosuvastatin, potassium chloride SA, pantoprazole, irbesartan, and albuterol.  Meds ordered this encounter  Medications   moxifloxacin (AVELOX) 400  MG tablet    Sig: Take 1 tablet (400 mg total) by mouth daily for 7 days.    Dispense:  7 tablet    Refill:  0   HYDROcodone bit-homatropine (HYCODAN) 5-1.5 MG/5ML syrup    Sig: Take 5 mLs by mouth every 8 (eight) hours as needed for cough.    Dispense:  120 mL    Refill:  0     Follow-up: Return in about 3 weeks (around 10/04/2021).  Scarlette Calico, MD

## 2021-09-13 NOTE — Patient Instructions (Signed)
Community-Acquired Pneumonia, Adult ?Pneumonia is a lung infection that causes inflammation and the buildup of mucus and fluids in the lungs. This may cause coughing and difficulty breathing. Community-acquired pneumonia is pneumonia that develops in people who are not, and have not recently been, in a hospital or other health care facility. ?Usually, pneumonia develops as a result of an illness that is caused by a virus, such as the common cold and the flu (influenza). It can also be caused by bacteria or fungi. While the common cold and influenza can pass from person to person (are contagious), pneumonia itself is not considered contagious. ?What are the causes? ?This condition may be caused by: ?Viruses. ?Bacteria. ?Fungi, such as molds or mushrooms. ?What increases the risk? ?The following factors may make you more likely to develop this condition: ?Having certain medical conditions, such as: ?A long-term (chronic) disease, which may include chronic obstructive pulmonary disease (COPD), asthma, heart failure, cystic fibrosis, diabetes, kidney disease, sickle cell disease, and human immunodeficiency virus (HIV). ?A condition that increases the risk of breathing in (aspirating) mucus and other fluids from your mouth and nose. ?A weakened body defense system (immune system). ?Having had your spleen removed (splenectomy). The spleen is the organ that helps fight germs and infections. ?Not cleaning your teeth and gums well (poor dental hygiene). ?Using tobacco products. ?Traveling to places where germs that cause pneumonia are present. ?Being near certain animals, or animal habitats, that have germs that cause pneumonia. ?Being older than 55 years of age. ?What are the signs or symptoms? ?Symptoms of this condition include: ?A dry cough or a wet (productive) cough. ?A fever. ?Sweating or chills. ?Chest pain, especially when breathing deeply or coughing. ?Fast breathing, difficulty breathing, or shortness of  breath. ?Tiredness (fatigue). ?Muscle aches. ?How is this diagnosed? ?This condition may be diagnosed based on your medical history or a physical exam. You may also have tests, including: ?Chest X-rays. ?Tests of the level of oxygen and other gases in your blood. ?Tests of: ?Your blood. ?Mucus from your lungs (sputum). ?Fluid around your lungs (pleural fluid). ?Your urine. ?If your pneumonia is severe, other tests may be done to learn more about the cause. ?How is this treated? ?Treatment for this condition depends on many factors, such as the cause of your pneumonia, your medicines, and other medical conditions that you have. ?For most adults, pneumonia may be treated at home. In some cases, treatment must happen in a hospital and may include: ?Medicines that are given by mouth (orally) or through an IV, including: ?Antibiotic medicines, if bacteria caused the pneumonia. ?Medicines that kill viruses (antiviral medicines), if a virus caused the pneumonia. ?Oxygen therapy. ?Severe pneumonia, although rare, may require the following treatments: ?Mechanical ventilation.This procedure uses a machine to help you breathe if you cannot breathe well on your own or maintain a safe level of blood oxygen. ?Thoracentesis. This procedure removes any buildup of pleural fluid to help with breathing. ?Follow these instructions at home: ?Medicines ?Take over-the-counter and prescription medicines only as told by your health care provider. ?Take cough medicine only if you have trouble sleeping. Cough medicine can prevent your body from removing mucus from your lungs. ?If you were prescribed an antibiotic medicine, take it as told by your health care provider. Do not stop taking the antibiotic even if you start to feel better. ?Lifestyle ?  ?Do not drink alcohol. ?Do not use any products that contain nicotine or tobacco, such as cigarettes, e-cigarettes, and chewing tobacco. If you   need help quitting, ask your health care  provider. ?Eat a healthy diet. This includes plenty of vegetables, fruits, whole grains, low-fat dairy products, and lean protein. ?General instructions ?Rest a lot and get at least 8 hours of sleep each night. ?Sleep in a partly upright position at night. Place a few pillows under your head or sleep in a reclining chair. ?Return to your normal activities as told by your health care provider. Ask your health care provider what activities are safe for you. ?Drink enough fluid to keep your urine pale yellow. This helps to thin the mucus in your lungs. ?If your throat is sore, gargle with a salt-water mixture 3-4 times a day or as needed. To make a salt-water mixture, completely dissolve ?-1 tsp (3-6 g) of salt in 1 cup (237 mL) of warm water. ?Keep all follow-up visits as told by your health care provider. This is important. ?How is this prevented? ?You can lower your risk of developing community-acquired pneumonia by: ?Getting the pneumonia vaccine. There are different types and schedules of pneumonia vaccines. Ask your health care provider which option is best for you. Consider getting the pneumonia vaccine if: ?You are older than 55 years of age. ?You are 19-65 years of age and are receiving cancer treatment, have chronic lung disease, or have other medical conditions that affect your immune system. Ask your health care provider if this applies to you. ?Getting your influenza vaccine every year. Ask your health care provider which type of vaccine is best for you. ?Getting regular dental checkups. ?Washing your hands often with soap and water for at least 20 seconds. If soap and water are not available, use hand sanitizer. ?Contact a health care provider if you have: ?A fever. ?Trouble sleeping because you cannot control your cough with cough medicine. ?Get help right away if: ?Your shortness of breath becomes worse. ?Your chest pain increases. ?Your sickness becomes worse, especially if you are an older adult or  have a weak immune system. ?You cough up blood. ?These symptoms may represent a serious problem that is an emergency. Do not wait to see if the symptoms will go away. Get medical help right away. Call your local emergency services (911 in the [place] Do not drive yourself to the hospital. ?Summary ?Pneumonia is an infection of the lungs. ?Community-acquired pneumonia develops in people who have not been in the hospital. It can be caused by bacteria, viruses, or fungi. ?This condition may be treated with antibiotics or antiviral medicines. ?Severe pneumonia may require a hospital stay and treatment to help with breathing. ?This information is not intended to replace advice given to you by your health care provider. Make sure you discuss any questions you have with your health care provider. ?Document Revised: 09/22/2019 Document Reviewed: 09/22/2019 ?Elsevier Patient Education ? 2022 [employer] ? ?

## 2021-09-14 LAB — SARS-COV-2, NAA 2 DAY TAT

## 2021-09-14 LAB — NOVEL CORONAVIRUS, NAA: SARS-CoV-2, NAA: NOT DETECTED

## 2021-09-17 ENCOUNTER — Other Ambulatory Visit: Payer: Self-pay | Admitting: Internal Medicine

## 2021-09-17 DIAGNOSIS — E118 Type 2 diabetes mellitus with unspecified complications: Secondary | ICD-10-CM

## 2021-09-25 ENCOUNTER — Telehealth: Payer: Self-pay | Admitting: Internal Medicine

## 2021-09-25 ENCOUNTER — Other Ambulatory Visit: Payer: Self-pay | Admitting: Internal Medicine

## 2021-09-25 DIAGNOSIS — E118 Type 2 diabetes mellitus with unspecified complications: Secondary | ICD-10-CM

## 2021-09-25 NOTE — Telephone Encounter (Signed)
Pt has been scheduled for tomorrow 10/4 @ 8.20am for follow up

## 2021-09-25 NOTE — Telephone Encounter (Signed)
Patient says she is not feeling better since seeing provider on 09.21.22.. says she feels that she has gotten worse.. wants to know if provider can send in another rx to pharmacy  Please call

## 2021-09-26 ENCOUNTER — Ambulatory Visit (INDEPENDENT_AMBULATORY_CARE_PROVIDER_SITE_OTHER): Payer: 59

## 2021-09-26 ENCOUNTER — Ambulatory Visit (INDEPENDENT_AMBULATORY_CARE_PROVIDER_SITE_OTHER): Payer: 59 | Admitting: Internal Medicine

## 2021-09-26 ENCOUNTER — Encounter: Payer: Self-pay | Admitting: Internal Medicine

## 2021-09-26 ENCOUNTER — Other Ambulatory Visit: Payer: Self-pay

## 2021-09-26 VITALS — BP 132/80 | HR 60 | Temp 98.3°F | Resp 16 | Wt 163.0 lb

## 2021-09-26 DIAGNOSIS — I10 Essential (primary) hypertension: Secondary | ICD-10-CM | POA: Diagnosis not present

## 2021-09-26 DIAGNOSIS — E876 Hypokalemia: Secondary | ICD-10-CM

## 2021-09-26 DIAGNOSIS — E118 Type 2 diabetes mellitus with unspecified complications: Secondary | ICD-10-CM

## 2021-09-26 DIAGNOSIS — J189 Pneumonia, unspecified organism: Secondary | ICD-10-CM

## 2021-09-26 DIAGNOSIS — R058 Other specified cough: Secondary | ICD-10-CM

## 2021-09-26 LAB — BASIC METABOLIC PANEL
BUN: 13 mg/dL (ref 6–23)
CO2: 28 mEq/L (ref 19–32)
Calcium: 9 mg/dL (ref 8.4–10.5)
Chloride: 104 mEq/L (ref 96–112)
Creatinine, Ser: 0.83 mg/dL (ref 0.40–1.20)
GFR: 79.21 mL/min (ref >60.00)
Glucose, Bld: 196 mg/dL — ABNORMAL HIGH (ref 70–99)
Potassium: 3.2 mEq/L — ABNORMAL LOW (ref 3.5–5.1)
Sodium: 141 mEq/L (ref 135–145)

## 2021-09-26 LAB — MAGNESIUM: Magnesium: 2 mg/dL (ref 1.5–2.5)

## 2021-09-26 LAB — HEMOGLOBIN A1C: Hgb A1c MFr Bld: 7.3 % — ABNORMAL HIGH (ref 4.6–6.5)

## 2021-09-26 MED ORDER — HYDROCODONE BIT-HOMATROP MBR 5-1.5 MG/5ML PO SOLN: 5.0000 mL | Freq: Three times a day (TID) | ORAL | 0 refills | Status: DC | PRN

## 2021-09-26 MED ORDER — TRULICITY 4.5 MG/0.5ML ~~LOC~~ SOAJ: 4.5000 mg | SUBCUTANEOUS | 1 refills | Status: DC

## 2021-09-26 MED ORDER — POTASSIUM CHLORIDE CRYS ER 20 MEQ PO TBCR: 20.0000 meq | EXTENDED_RELEASE_TABLET | Freq: Three times a day (TID) | ORAL | 0 refills | Status: DC

## 2021-09-26 NOTE — Patient Instructions (Signed)

## 2021-09-26 NOTE — Progress Notes (Signed)
Subjective:  Patient ID: Ashley [name], female    DOB: 1966/10/08  Age: 55 y.o. MRN: [id number]  CC: Diabetes, Cough, and Hypertension  This visit occurred during the SARS-CoV-2 public health emergency.  Safety protocols were in place, including screening questions prior to the visit, additional usage of staff PPE, and extensive cleaning of exam room while observing appropriate contact time as indicated for disinfecting solutions.    HPI Ashley [name] presents for f/up -   Her cough has improved but she has run out of the cough suppressant. The cough is productive of clear phlegm and it hurts to cough.  Outpatient Medications Prior to Visit  Medication Sig Dispense Refill   [name] 225 MG/1.5ML SOSY INJECT 225 MG INTO THE SKIN EVERY 30 DAYS (Patient taking differently: Inject 225 mg into the skin every 30 (thirty) days.) 1.5 mL 11   albuterol (VENTOLIN HFA) 108 (90 Base) MCG/ACT inhaler INHALE 2 PUFFS INTO THE LUNGS EVERY 6 HOURS AS NEEDED FOR WHEEZING OR SHORTNESS OF BREATH 6.7 g 3   AQUALANCE LANCETS 30G MISC   98   cholecalciferol (VITAMIN D3) 25 MCG (1000 UT) tablet Take 2,000 Units by mouth daily.     conjugated estrogens (PREMARIN) vaginal cream Place 1 Applicatorful vaginally daily. 127.5 g 1   Continuous Blood Gluc Receiver (FREESTYLE LIBRE 2 READER) DEVI 1 Act by Does not apply route daily. 2 each 5   Continuous Blood Gluc Sensor (FREESTYLE LIBRE 2 SENSOR) MISC USE TO CHECK BLOOD SUGER DAILY 2 each 5   FEROSUL 325 (65 Fe) MG tablet TAKE 1 TABLET(325 MG) BY MOUTH TWICE DAILY WITH A MEAL 180 tablet 1   fludrocortisone (FLORINEF) 0.1 MG tablet Take 1 tablet (0.1 mg total) by mouth daily. 90 tablet 1   formoterol (PERFOROMIST) 20 MCG/2ML nebulizer solution Take 2 mLs (20 mcg total) by nebulization 2 (two) times daily. 360 mL 1   Insulin Pen Needle 32G X 6 MM MISC Use daily as directed 100 each 1   irbesartan (AVAPRO) 150 MG tablet TAKE 1 TABLET(150 MG) BY MOUTH DAILY 90 tablet 0   Lancet  Devices (ADJUSTABLE LANCING DEVICE) MISC Up to tid testing  98   LEVEMIR 100 UNIT/ML injection ADMINISTER 30 UNITS UNDER THE SKIN DAILY 10 mL 3   levETIRAcetam (KEPPRA) 500 MG tablet Take 500 mg by mouth 2 (two) times daily.     metoprolol tartrate (LOPRESSOR) 50 MG tablet Take 1 tablet (50 mg total) by mouth 2 (two) times daily. 180 tablet 3   pantoprazole (PROTONIX) 40 MG tablet TAKE 1 TABLET(40 MG) BY MOUTH DAILY 90 tablet 1   promethazine (PHENERGAN) 12.5 MG tablet Take 1 tablet (12.5 mg total) by mouth every 6 (six) hours as needed for nausea or vomiting. 30 tablet 3   revefenacin (YUPELRI) 175 MCG/3ML nebulizer solution Take 3 mLs (175 mcg total) by nebulization daily. 270 mL 1   Rimegepant Sulfate (NURTEC) 75 MG TBDP Take 75 mg by mouth daily as needed. For migraines. Take as close to onset of migraine as possible. One daily maximum. 10 tablet 6   rosuvastatin (CRESTOR) 20 MG tablet TAKE 1 TABLET BY MOUTH EVERY DAY 90 tablet 1   thiamine (VITAMIN B-1) 50 MG tablet Take 1 tablet (50 mg total) by mouth daily. 90 tablet 1   traZODone (DESYREL) 150 MG tablet Take 1 tablet (150 mg total) by mouth at bedtime. 90 tablet 1   venlafaxine XR (EFFEXOR-XR) 150 MG 24 hr capsule TAKE  1 CAPSULE(150 MG) BY MOUTH DAILY WITH BREAKFAST (Patient taking differently: Take 150 mg by mouth at bedtime.) 90 capsule 3   Dulaglutide (TRULICITY) 3 TM/1.9QQ SOPN Inject 3 mg as directed once a week. 6 mL 1   HYDROcodone bit-homatropine (HYCODAN) 5-1.5 MG/5ML syrup Take 5 mLs by mouth every 8 (eight) hours as needed for cough. 120 mL 0   potassium chloride SA (KLOR-CON) 20 MEQ tablet TAKE 1 TABLET(20 MEQ) BY MOUTH TWICE DAILY 180 tablet 0   No facility-administered medications prior to visit.    ROS Review of Systems  Constitutional:  Negative for chills, diaphoresis, fatigue and fever.  HENT: Negative.    Eyes:  Negative for visual disturbance.  Respiratory:  Positive for cough. Negative for chest tightness,  shortness of breath and wheezing.   Cardiovascular:  Negative for chest pain, palpitations and leg swelling.  Gastrointestinal:  Negative for abdominal pain, constipation, diarrhea and vomiting.  Endocrine: Negative.   Genitourinary: Negative.  Negative for difficulty urinating.  Musculoskeletal: Negative.  Negative for arthralgias and myalgias.  Skin: Negative.  Negative for color change.  Neurological:  Negative for dizziness, weakness and light-headedness.  Hematological:  Negative for adenopathy. Does not bruise/bleed easily.  Psychiatric/Behavioral: Negative.     Objective:  BP 132/80 (BP Location: Left Arm, Patient Position: Sitting, Cuff Size: Large)   Pulse 60   Temp 98.3 F (36.8 C) (Oral)   Resp 16   Wt 163 lb (73.9 kg)   LMP 02/12/2014   SpO2 96%   BMI 29.81 kg/m   BP Readings from Last 3 Encounters:  09/26/21 132/80  09/13/21 132/84  06/13/21 130/72    Wt Readings from Last 3 Encounters:  09/26/21 163 lb (73.9 kg)  09/13/21 162 lb (73.5 kg)  06/13/21 166 lb 12.8 oz (75.7 kg)    Physical Exam Vitals reviewed.  Constitutional:      General: She is not in acute distress.    Appearance: She is not ill-appearing, toxic-appearing or diaphoretic.  HENT:     Nose: Nose normal.     Mouth/Throat:     Mouth: Mucous membranes are moist.  Eyes:     Conjunctiva/sclera: Conjunctivae normal.  Cardiovascular:     Rate and Rhythm: Normal rate and regular rhythm.     Heart sounds: No murmur heard.   No gallop.  Pulmonary:     Effort: Pulmonary effort is normal.     Breath sounds: No stridor. No wheezing, rhonchi or rales.  Abdominal:     General: Abdomen is protuberant. Bowel sounds are normal. There is no distension.     Palpations: Abdomen is soft. There is no hepatomegaly, splenomegaly or mass.  Musculoskeletal:        General: Normal range of motion.     Cervical back: Neck supple.     Right lower leg: No edema.     Left lower leg: No edema.  Lymphadenopathy:      Cervical: No cervical adenopathy.  Skin:    General: Skin is warm and dry.  Neurological:     General: No focal deficit present.     Mental Status: She is alert.  Psychiatric:        Mood and Affect: Mood normal.        Behavior: Behavior normal.    Lab Results  Component Value Date   WBC 8.5 05/09/2021   HGB 13.6 05/09/2021   HCT 40.0 05/09/2021   PLT 321 05/09/2021   GLUCOSE 196 (H) 09/26/2021  CHOL 97 03/21/2021   TRIG 78 03/21/2021   HDL 32 (L) 03/21/2021   LDLDIRECT 152.1 01/19/2013   LDLCALC 49 03/21/2021   ALT 18 05/09/2021   AST 17 05/09/2021   NA 141 09/26/2021   K 3.2 (L) 09/26/2021   CL 104 09/26/2021   CREATININE 0.83 09/26/2021   BUN 13 09/26/2021   CO2 28 09/26/2021   TSH 1.670 10/31/2020   INR 1.0 05/09/2021   HGBA1C 7.3 (H) 09/26/2021   MICROALBUR <0.7 05/09/2020    VAS US CAROTID  Result Date: 08/16/2021 Carotid Arterial Duplex Study Patient Name:  Ysabela NAYZETH ALTMAN  Date of Exam:   08/15/2021 Medical Rec #: [id number]    Accession #:    [id number] Date of Birth: 06-02-1966    Patient Gender: F Patient Age:   26 years Exam Location:  [facility] Procedure:      VAS US CAROTID Referring Phys: Suanne Marker BARRETT --------------------------------------------------------------------------------  Indications:       Carotid artery disease and patient has had at least 12                    syncopal episodes since last year. She reports with each                    episode she experiences worsening headaches, dizziness and                    blurred and double vision. She states she still has some left                    facial numbness but it is not as bad as it use to be. She                    denies any other cerebrovascular symptoms. Risk Factors:      Hypertension, hyperlipidemia, Diabetes, current smoker, prior                    CVA. Comparison Study:  In 07/2020, a carotid duplex showed velocities of 89/30 cm/s                    in the RICA and 85/27 cm/s in the LICA.  Performing Technologist: Sharlett Iles RVT  Examination Guidelines: A complete evaluation includes B-mode imaging, spectral Doppler, color Doppler, and power Doppler as needed of all accessible portions of each vessel. Bilateral testing is considered an integral part of a complete examination. Limited examinations for reoccurring indications may be performed as noted.  Right Carotid Findings: +----------+--------+--------+--------+------------------+--------+           PSV cm/sEDV cm/sStenosisPlaque DescriptionComments +----------+--------+--------+--------+------------------+--------+ CCA Prox  107     17                                         +----------+--------+--------+--------+------------------+--------+ CCA Distal70      18                                         +----------+--------+--------+--------+------------------+--------+ ICA Prox  71      23      1-39%   heterogenous               +----------+--------+--------+--------+------------------+--------+ ICA Mid  78      29                                         +----------+--------+--------+--------+------------------+--------+ ICA Distal80      29                                         +----------+--------+--------+--------+------------------+--------+ ECA       90      13                                         +----------+--------+--------+--------+------------------+--------+ +----------+--------+-------+----------------------+-------------------+           PSV cm/sEDV cmsDescribe              Arm Pressure (mmHG) +----------+--------+-------+----------------------+-------------------+ [id number]            Stenotic and Turbulent158                 +----------+--------+-------+----------------------+-------------------+ +---------+--------+--+--------+--+---------+ VertebralPSV cm/s40EDV cm/s14Antegrade +---------+--------+--+--------+--+---------+  Left Carotid Findings:  +----------+--------+--------+--------+--------------------------+--------+           PSV cm/sEDV cm/sStenosisPlaque Description        Comments +----------+--------+--------+--------+--------------------------+--------+ CCA Prox  116     23                                                 +----------+--------+--------+--------+--------------------------+--------+ CCA Distal62      20                                                 +----------+--------+--------+--------+--------------------------+--------+ ICA Prox  72      28      1-39%   heterogenous and irregular         +----------+--------+--------+--------+--------------------------+--------+ ICA Mid   74      26                                                 +----------+--------+--------+--------+--------------------------+--------+ ICA Distal80      27                                                 +----------+--------+--------+--------+--------------------------+--------+ ECA       86      12              heterogenous                       +----------+--------+--------+--------+--------------------------+--------+ +----------+--------+--------+----------------+-------------------+           PSV cm/sEDV cm/sDescribe        Arm Pressure (mmHG) +----------+--------+--------+----------------+-------------------+ [id number]  Multiphasic, STM196                 +----------+--------+--------+----------------+-------------------+ +---------+--------+--+--------+--+---------+ VertebralPSV cm/s43EDV cm/s15Antegrade +---------+--------+--+--------+--+---------+   Summary: Right Carotid: Velocities in the right ICA are consistent with a 1-39% stenosis.                Stable RICA velocities compared to prior exam. Left Carotid: Velocities in the left ICA are consistent with a 1-39% stenosis.               Stable LICA velocities compared to prior exam. Vertebrals:  Bilateral vertebral  arteries demonstrate antegrade flow. Subclavians: Right subclavian artery was stenotic. Right subclavian artery flow              was disturbed. Normal flow hemodynamics were seen in the left              subclavian artery. '  Incidental findings: Enlarged lymph node anterior to the left external carotid artery, measuring 1.4 x .46 x 1.0 cm. *See table(s) above for measurements and observations.  Electronically signed by Carlyle Dolly MD on 08/16/2021 at 4:23:00 PM.    Final    DG Chest 2 View  Result Date: 09/26/2021 CLINICAL DATA:  Follow-up pneumonia. Patient complains of cough, SOB, and chest pain x 2 weeks. Hx of HTN and diabetes. Smoker. EXAM: CHEST - 2 VIEW COMPARISON:  09/13/2021 and older exams. FINDINGS: Previously noted opacity at the right lung base has resolved. Lungs are now clear. No pleural effusion or pneumothorax. Cardiac silhouette is normal in size and configuration. Normal mediastinal and hilar contours. Skeletal structures are unremarkable. IMPRESSION: 1. Resolved right middle lobe infiltrate. 2. No active cardiopulmonary disease. Electronically Signed   By: Lajean Manes M.D.   On: 09/26/2021 08:35     Assessment & Plan:   Ashley [name] was seen today for diabetes, cough and hypertension.  Diagnoses and all orders for this visit:  Pneumonia of right lower lobe due to infectious organism- This has been adequately treated. -     DG Chest 2 View; Future -     HYDROcodone bit-homatropine (HYCODAN) 5-1.5 MG/5ML syrup; Take 5 mLs by mouth every 8 (eight) hours as needed for cough.  Essential hypertension- Her BP is well controlled. Will increase the dose of the K+ supplement. -     Basic metabolic panel; Future -     Magnesium; Future -     Magnesium -     Basic metabolic panel -     potassium chloride SA (KLOR-CON) 20 MEQ tablet; Take 1 tablet (20 mEq total) by mouth 3 (three) times daily.  Diabetes mellitus with complication ([place])- Her Q2W is up to 7.3%. will increase the dose of  the GLP-1 ag. -     Basic metabolic panel; Future -     Hemoglobin A1c; Future -     Hemoglobin A1c -     Basic metabolic panel -     Dulaglutide (TRULICITY) 4.5 LN/9.8XQ SOPN; Inject 4.5 mg as directed once a week.  Hypokalemia -     Basic metabolic panel; Future -     Magnesium; Future -     Magnesium -     Basic metabolic panel -     potassium chloride SA (KLOR-CON) 20 MEQ tablet; Take 1 tablet (20 mEq total) by mouth 3 (three) times daily.  Cough productive of clear sputum -     HYDROcodone bit-homatropine (HYCODAN) 5-1.5 MG/5ML syrup; Take 5 mLs by mouth every 8 (  eight) hours as needed for cough.  I have discontinued Rukia B. Abeyta's Trulicity. I have also changed her potassium chloride SA. Additionally, I am having her start on Trulicity. Lastly, I am having her maintain her Adjustable Lancing Device, AquaLance Lancets 30G, Insulin Pen Needle, cholecalciferol, [name], levETIRAcetam, venlafaxine XR, metoprolol tartrate, FreeStyle Libre 2 Reader, thiamine, fludrocortisone, FeroSul, Nurtec, revefenacin, formoterol, conjugated estrogens, promethazine, traZODone, rosuvastatin, pantoprazole, irbesartan, albuterol, Levemir, FreeStyle Libre 2 Sensor, and HYDROcodone bit-homatropine.  Meds ordered this encounter  Medications   HYDROcodone bit-homatropine (HYCODAN) 5-1.5 MG/5ML syrup    Sig: Take 5 mLs by mouth every 8 (eight) hours as needed for cough.    Dispense:  120 mL    Refill:  0   potassium chloride SA (KLOR-CON) 20 MEQ tablet    Sig: Take 1 tablet (20 mEq total) by mouth 3 (three) times daily.    Dispense:  270 tablet    Refill:  0   Dulaglutide (TRULICITY) 4.5 YZ/7.0DU SOPN    Sig: Inject 4.5 mg as directed once a week.    Dispense:  6 mL    Refill:  1      Follow-up: Return in about 3 months (around 12/27/2021).  Scarlette Calico, MD

## 2021-10-02 ENCOUNTER — Other Ambulatory Visit: Payer: Self-pay | Admitting: Internal Medicine

## 2021-10-02 DIAGNOSIS — I959 Hypotension, unspecified: Secondary | ICD-10-CM

## 2021-10-02 DIAGNOSIS — E611 Iron deficiency: Secondary | ICD-10-CM

## 2021-10-05 ENCOUNTER — Other Ambulatory Visit: Payer: Self-pay

## 2021-10-05 MED ORDER — METOPROLOL TARTRATE 50 MG PO TABS: 50.0000 mg | ORAL_TABLET | Freq: Two times a day (BID) | ORAL | 3 refills | Status: DC

## 2021-10-16 ENCOUNTER — Other Ambulatory Visit: Payer: Self-pay | Admitting: Internal Medicine

## 2021-10-16 DIAGNOSIS — F411 Generalized anxiety disorder: Secondary | ICD-10-CM

## 2021-10-17 ENCOUNTER — Ambulatory Visit: Payer: 59 | Admitting: Internal Medicine

## 2021-10-19 ENCOUNTER — Ambulatory Visit: Payer: 59 | Admitting: Internal Medicine

## 2021-10-19 ENCOUNTER — Telehealth: Payer: Self-pay | Admitting: Internal Medicine

## 2021-10-19 NOTE — Telephone Encounter (Signed)
Patient calling to inquire why rx clonazePAM (KLONOPIN) tablet 1 mg      was denied  Informed patient refill not appropriate  Patient requesting a call back to discuss other medication options

## 2021-10-20 ENCOUNTER — Other Ambulatory Visit: Payer: Self-pay | Admitting: Internal Medicine

## 2021-10-20 DIAGNOSIS — E118 Type 2 diabetes mellitus with unspecified complications: Secondary | ICD-10-CM

## 2021-10-23 ENCOUNTER — Other Ambulatory Visit: Payer: Self-pay | Admitting: Neurology

## 2021-11-01 NOTE — Progress Notes (Signed)
Subjective:    Patient ID: Ashley [name], female    DOB: 22-Jul-1966, 55 y.o.   MRN: [id number]  This visit occurred during the SARS-CoV-2 public health emergency.  Safety protocols were in place, including screening questions prior to the visit, additional usage of staff PPE, and extensive cleaning of exam room while observing appropriate contact time as indicated for disinfecting solutions.    HPI The patient is here for an acute visit.    Her symptoms started yesterday.  She was sitting talking on the phone, however when she got up both legs felt very heavy.  She had shooting pain in both of her calves-the right leg was worse than the left leg.  The pain is intermittent.  It is worse with walking and better with sitting.  When she stands the right leg feels like the skin is pulling like it might be swollen, but she has not noted any swelling.  Today her legs still feel heavy.  She is still experiencing intermittent sharp pains in her calves.  She does have chronic numbness/tingling in her feet that comes and goes.  Its not different.  She denies any numbness or tingling elsewhere.     MRI lumbar spine 01/2017 - no spinal stenosis, no foraminal narrowing     Medications and allergies reviewed with patient and updated if appropriate.  Patient Active Problem List   Diagnosis Date Noted   Cough productive of clear sputum 09/13/2021   Pneumonia of right lower lobe due to infectious organism 09/13/2021   Bilateral mastodynia 05/24/2021   Chronic bronchitis with productive mucopurulent cough ([place]) 05/04/2021   Hypokalemia 04/13/2021   Stroke (cerebrum) ([place]) 03/20/2021   Autonomic dysfunction 08/22/2020   Paroxysmal SVT (supraventricular tachycardia) ([place]) 07/25/2020   Menopausal vaginal dryness 07/25/2020   Hot flashes, menopausal 11/10/2019   Bilateral hearing loss 11/10/2019   Dietary iron deficiency 09/01/2019   Lumbar radiculitis 02/10/2019   Gastroesophageal reflux  disease with esophagitis 02/10/2019   Essential hypertension 10/20/2018   Chronic migraine without aura, with intractable migraine, so stated, with status migrainosus 07/02/2018   Thiamine deficiency 01/05/2018   Constipation 09/12/2017   Moderate episode of recurrent major depressive disorder ([place]) 05/16/2017   Diabetes mellitus with complication (H[facility])    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   Tobacco abuse 01/08/2017   Chronic bronchitis ([place]) 10/20/2015   Dyslipidemia, goal LDL below 70 04/12/2015   Migraine with aura, intractable 08/12/2014   Routine general medical examination at a health care facility 05/24/2011   Anxiety state 09/30/2009    Current Outpatient Medications on File Prior to Visit  Medication Sig Dispense Refill   [name] 225 MG/1.5ML SOSY INJECT 225 MG INTO THE SKIN EVERY 30 DAYS (Patient taking differently: Inject 225 mg into the skin every 30 (thirty) days.) 1.5 mL 11   albuterol (VENTOLIN HFA) 108 (90 Base) MCG/ACT inhaler INHALE 2 PUFFS INTO THE LUNGS EVERY 6 HOURS AS NEEDED FOR WHEEZING OR SHORTNESS OF BREATH 6.7 g 3   AQUALANCE LANCETS 30G MISC   98   cholecalciferol (VITAMIN D3) 25 MCG (1000 UT) tablet Take 2,000 Units by mouth daily.     conjugated estrogens (PREMARIN) vaginal cream Place 1 Applicatorful vaginally daily. 127.5 g 1   Continuous Blood Gluc Receiver (FREESTYLE LIBRE 2 READER) DEVI 1 Act by Does not apply route daily. 2 each 5   Continuous Blood Gluc Sensor (FREESTYLE LIBRE 2 SENSOR) MISC USE TO CHECK BLOOD SUGER DAILY 2 each 5   Dulaglutide (  TRULICITY) 4.5 WG/6.6ZL SOPN Inject 4.5 mg as directed once a week. 6 mL 1   FEROSUL 325 (65 Fe) MG tablet TAKE 1 TABLET(325 MG) BY MOUTH TWICE DAILY WITH A MEAL 180 tablet 1   fludrocortisone (FLORINEF) 0.1 MG tablet TAKE 1 TABLET(0.1 MG) BY MOUTH DAILY 90 tablet 1   formoterol (PERFOROMIST) 20 MCG/2ML nebulizer solution Take 2 mLs (20 mcg total) by nebulization 2 (two) times daily. 360 mL 1   Insulin Pen Needle  32G X 6 MM MISC Use daily as directed 100 each 1   irbesartan (AVAPRO) 150 MG tablet TAKE 1 TABLET(150 MG) BY MOUTH DAILY 90 tablet 0   Lancet Devices (ADJUSTABLE LANCING DEVICE) MISC Up to tid testing  98   LEVEMIR 100 UNIT/ML injection ADMINISTER 30 UNITS UNDER THE SKIN DAILY 10 mL 3   levETIRAcetam (KEPPRA) 500 MG tablet Take 500 mg by mouth 2 (two) times daily.     metoprolol tartrate (LOPRESSOR) 50 MG tablet Take 1 tablet (50 mg total) by mouth 2 (two) times daily. 180 tablet 3   pantoprazole (PROTONIX) 40 MG tablet TAKE 1 TABLET(40 MG) BY MOUTH DAILY 90 tablet 1   potassium chloride SA (KLOR-CON) 20 MEQ tablet Take 1 tablet (20 mEq total) by mouth 3 (three) times daily. 270 tablet 0   promethazine (PHENERGAN) 12.5 MG tablet Take 1 tablet (12.5 mg total) by mouth every 6 (six) hours as needed for nausea or vomiting. 30 tablet 3   revefenacin (YUPELRI) 175 MCG/3ML nebulizer solution Take 3 mLs (175 mcg total) by nebulization daily. 270 mL 1   Rimegepant Sulfate (NURTEC) 75 MG TBDP Take 75 mg by mouth daily as needed. For migraines. Take as close to onset of migraine as possible. One daily maximum. 10 tablet 6   rosuvastatin (CRESTOR) 20 MG tablet TAKE 1 TABLET BY MOUTH EVERY DAY 90 tablet 1   thiamine (VITAMIN B-1) 50 MG tablet Take 1 tablet (50 mg total) by mouth daily. 90 tablet 1   traZODone (DESYREL) 150 MG tablet Take 1 tablet (150 mg total) by mouth at bedtime. 90 tablet 1   venlafaxine XR (EFFEXOR-XR) 150 MG 24 hr capsule TAKE 1 CAPSULE(150 MG) BY MOUTH DAILY WITH BREAKFAST (Patient taking differently: Take 150 mg by mouth at bedtime.) 90 capsule 3   No current facility-administered medications on file prior to visit.    Past Medical History:  Diagnosis Date   Anxiety    Atypical ductal hyperplasia of left breast    Bronchitis    uses inhaler if needed for bronchitis, lasted used -2014   Cataract    bilateral   Chronic headaches    migraines - in past, uses Phenergan for nausea     COPD (chronic obstructive pulmonary disease) ([place])    Deficiency anemia 12/30/2017   Depression    Diabetes mellitus without complication ([place])    taken off Metformin since 04/2014, HgbA1C - normal, will follow up with PCP- Dr. Deborra Medina, 07/2014   GERD (gastroesophageal reflux disease)    H/O exercise stress test 2011   done at [facility]- told that it was WNL, done due to pt. having panic attacks    History of blood transfusion March 19, 1966   at birth in [place], [place], unsure number of units   PONV (postoperative nausea and vomiting)    Poor dentition    very poor oral health    SVD (spontaneous vaginal delivery)    x 3   Tobacco abuse    Wears  glasses     Past Surgical History:  Procedure Laterality Date   ABDOMINAL HYSTERECTOMY     APPENDECTOMY  1988   BREAST LUMPECTOMY WITH RADIOACTIVE SEED LOCALIZATION Left 03/17/2020   Procedure: LEFT BREAST LUMPECTOMY WITH RADIOACTIVE SEED LOCALIZATION;  Surgeon: Coralie Keens, MD;  Location: [facility];  Service: General;  Laterality: Left;  LMA   CARDIAC CATHETERIZATION N/A 08/17/2016   Procedure: Left Heart Cath and Coronary Angiography;  Surgeon: Burnell Blanks, MD;  Location: [facility];  Service: Cardiovascular;  Laterality: N/A;   CHOLECYSTECTOMY N/A 06/07/2014   Procedure: LAPAROSCOPIC CHOLECYSTECTOMY;  Surgeon: Ralene Ok, MD;  Location: [facility];  Service: General;  Laterality: N/A;   CHOLECYSTECTOMY  June 07 2014   COLONOSCOPY  02/16/2014   normal    ESOPHAGOGASTRODUODENOSCOPY  12/28/2013   KNEE ARTHROSCOPY  1995   left   LAPAROSCOPIC ASSISTED VAGINAL HYSTERECTOMY N/A 04/15/2014   Procedure: LAPAROSCOPIC ASSISTED VAGINAL HYSTERECTOMY;  Surgeon: Marylynn Pearson, MD;  Location: [facility];  Service: Gynecology;  Laterality: N/A;   LAPAROSCOPIC BILATERAL SALPINGO OOPHERECTOMY Bilateral 04/15/2014   Procedure: LAPAROSCOPIC BILATERAL SALPINGO OOPHORECTOMY;  Surgeon: Marylynn Pearson, MD;  Location: [facility];  Service: Gynecology;   Laterality: Bilateral;   TUBAL LIGATION      Social History   Socioeconomic History   Marital status: Married    Spouse name: Alvester Chou   Number of children: 6   Years of education: 12   Highest education level: Not on file  Occupational History   Occupation:      Comment: helps run a friends business  Tobacco Use   Smoking status: Every Day    Packs/day: 1.50    Years: 35.00    Pack years: 52.50    Types: Cigarettes   Smokeless tobacco: Never  Vaping Use   Vaping Use: Never used  Substance and Sexual Activity   Alcohol use: No    Alcohol/week: 0.0 standard drinks   Drug use: No   Sexual activity: Yes    Partners: Male    Birth control/protection: Surgical  Other Topics Concern   Not on file  Social History Narrative   5 living children, one child is a crack cocaine addict who often breaks into their house to steal money. He is currently in jail. One child died of cerebral palsy.   Caffeine use: Dr Malachi Bonds (3 per day)   2 cups coffee per day   Online student, currently not working   Social Determinants of Radio broadcast assistant Strain: Not on file  Food Insecurity: Not on file  Transportation Needs: Not on file  Physical Activity: Not on file  Stress: Not on file  Social Connections: Not on file    Family History  Problem Relation Age of Onset   Diabetes Mother    Hyperlipidemia Mother    Hypertension Mother    Anxiety disorder Mother    Depression Mother    Drug abuse Mother    Stroke Mother        x3   Prostate cancer Father    Alcohol abuse Father    Lung cancer Maternal Grandfather        smoked   Emphysema Maternal Grandfather        smoked   COPD Maternal Grandmother        never smoked    Dementia Maternal Grandmother    Thyroid cancer Paternal Aunt    Colon cancer Neg Hx    Rectal cancer Neg Hx  Stomach cancer Neg Hx    Migraines Neg Hx     Review of Systems  Constitutional:  Negative for chills and fever.  Respiratory:  Positive  for shortness of breath (chronic - no change).   Cardiovascular:  Positive for palpitations (chronic - no change). Negative for chest pain and leg swelling.  Musculoskeletal:  Positive for back pain (chronic).  Skin:  Negative for color change.  Neurological:  Positive for numbness (feet  - chronic).      Objective:   Vitals:   11/02/21 0914  BP: 126/80  Pulse: 72  Temp: 98.2 F (36.8 C)  SpO2: 97%   BP Readings from Last 3 Encounters:  11/02/21 126/80  09/26/21 132/80  09/13/21 132/84   Wt Readings from Last 3 Encounters:  11/02/21 157 lb (71.2 kg)  09/26/21 163 lb (73.9 kg)  09/13/21 162 lb (73.5 kg)   Body mass index is 28.72 kg/m.   Physical Exam Constitutional:      General: She is not in acute distress.    Appearance: Normal appearance. She is not ill-appearing.  HENT:     Head: Normocephalic and atraumatic.  Cardiovascular:     Rate and Rhythm: Normal rate and regular rhythm.     Comments: Decreased DP pulses bilaterally Pulmonary:     Effort: Pulmonary effort is normal. No respiratory distress.     Breath sounds: No wheezing or rales.  Musculoskeletal:        General: Tenderness (Positive tenderness in right calf with palpation and flexion/extension of ankle, no other tenderness in bilateral legs) present. No swelling or deformity.     Right lower leg: No edema.     Left lower leg: No edema.  Skin:    General: Skin is warm and dry.     Findings: No bruising, erythema, lesion or rash.  Neurological:     Mental Status: She is alert.           Assessment & Plan:    Leg heaviness, bilateral and bilateral calf pain: Acute Symptoms started yesterday when she stood up after sitting for an hour She has been relatively inactive and she does smoke She is experiencing leg heaviness that is worse with activity and better with rest, which could be PAD Bilateral calf pain-right worse than left and it is tender on exam No swelling We will get venous  ultrasound to rule out DVT today Will refer to vascular for further evaluation of PAD

## 2021-11-02 ENCOUNTER — Ambulatory Visit (HOSPITAL_COMMUNITY)
Admission: RE | Admit: 2021-11-02 | Discharge: 2021-11-02 | Disposition: A | Discharge status: Home / Self Care | Payer: 59 | Source: Ambulatory Visit | Attending: Cardiovascular Disease | Admitting: Cardiovascular Disease

## 2021-11-02 ENCOUNTER — Other Ambulatory Visit: Payer: Self-pay

## 2021-11-02 ENCOUNTER — Ambulatory Visit (INDEPENDENT_AMBULATORY_CARE_PROVIDER_SITE_OTHER): Payer: 59 | Admitting: Internal Medicine

## 2021-11-02 ENCOUNTER — Encounter: Payer: Self-pay | Admitting: Internal Medicine

## 2021-11-02 VITALS — BP 126/80 | HR 72 | Temp 98.2°F | Ht 62.0 in | Wt 157.0 lb

## 2021-11-02 DIAGNOSIS — M79661 Pain in right lower leg: Secondary | ICD-10-CM | POA: Diagnosis present

## 2021-11-02 DIAGNOSIS — M79662 Pain in left lower leg: Secondary | ICD-10-CM | POA: Diagnosis not present

## 2021-11-02 DIAGNOSIS — R29898 Other symptoms and signs involving the musculoskeletal system: Secondary | ICD-10-CM

## 2021-11-02 NOTE — Patient Instructions (Signed)
    An Ultrasound of your legs was ordered to rule out a blood clot.     A referral was ordered for vascular surgery.       Someone from their office will call you to schedule an appointment.

## 2021-11-24 ENCOUNTER — Telehealth: Payer: Self-pay | Admitting: Internal Medicine

## 2021-11-24 ENCOUNTER — Other Ambulatory Visit: Payer: Self-pay | Admitting: Internal Medicine

## 2021-11-24 DIAGNOSIS — E119 Type 2 diabetes mellitus without complications: Secondary | ICD-10-CM

## 2021-11-24 DIAGNOSIS — E118 Type 2 diabetes mellitus with unspecified complications: Secondary | ICD-10-CM

## 2021-11-24 DIAGNOSIS — Z794 Long term (current) use of insulin: Secondary | ICD-10-CM

## 2021-11-24 MED ORDER — INSULIN PEN NEEDLE 32G X 6 MM MISC: 1.0000 | Freq: Four times a day (QID) | 1 refills | Status: DC

## 2021-11-24 MED ORDER — INSULIN ASPART (W/NIACINAMIDE) 100 UNIT/ML ~~LOC~~ SOPN: 10.0000 [IU] | PEN_INJECTOR | Freq: Three times a day (TID) | SUBCUTANEOUS | 0 refills | Status: DC

## 2021-11-24 NOTE — Telephone Encounter (Signed)
Trulicity last used on Monday.   Levimir (30units) normally used and taken at night. This week she has been using 40units due to sugars being elevated.  Readings over 300 and sometimes machine says "high" without giving pt a number reading.

## 2021-11-24 NOTE — Telephone Encounter (Signed)
Pt. Connected to [facility] 12.1.2022.   Caller states she is diabetic, says her blood sugar is over 300, is 393 right now. Patient needs to know what to do. Has been higher all week. Patient says blood sugar was 161 this morning. Patient is lightheaded.  Last reading HI at 19:32 with libre 2 system. Finger stick 356.    Advised to call PCP.

## 2021-11-24 NOTE — Telephone Encounter (Signed)
Pt has been informed that new Rx was sent. I recommended that she follow up with a phone call next week if she notices her blood sugars are still elevated. She understood.

## 2021-12-20 ENCOUNTER — Other Ambulatory Visit: Payer: Self-pay | Admitting: Internal Medicine

## 2021-12-20 DIAGNOSIS — E118 Type 2 diabetes mellitus with unspecified complications: Secondary | ICD-10-CM

## 2021-12-20 DIAGNOSIS — F411 Generalized anxiety disorder: Secondary | ICD-10-CM

## 2021-12-20 DIAGNOSIS — E785 Hyperlipidemia, unspecified: Secondary | ICD-10-CM

## 2021-12-20 DIAGNOSIS — I739 Peripheral vascular disease, unspecified: Secondary | ICD-10-CM

## 2022-01-04 ENCOUNTER — Other Ambulatory Visit: Payer: Self-pay

## 2022-01-04 DIAGNOSIS — I739 Peripheral vascular disease, unspecified: Secondary | ICD-10-CM

## 2022-01-11 ENCOUNTER — Ambulatory Visit (INDEPENDENT_AMBULATORY_CARE_PROVIDER_SITE_OTHER): Payer: 59 | Admitting: Internal Medicine

## 2022-01-11 ENCOUNTER — Encounter: Payer: Self-pay | Admitting: Internal Medicine

## 2022-01-11 ENCOUNTER — Other Ambulatory Visit: Payer: Self-pay

## 2022-01-11 VITALS — BP 154/90 | HR 90 | Temp 98.2°F | Ht 62.0 in | Wt 156.0 lb

## 2022-01-11 DIAGNOSIS — Z23 Encounter for immunization: Secondary | ICD-10-CM | POA: Diagnosis not present

## 2022-01-11 DIAGNOSIS — N951 Menopausal and female climacteric states: Secondary | ICD-10-CM | POA: Diagnosis not present

## 2022-01-11 DIAGNOSIS — E119 Type 2 diabetes mellitus without complications: Secondary | ICD-10-CM

## 2022-01-11 DIAGNOSIS — G43711 Chronic migraine without aura, intractable, with status migrainosus: Secondary | ICD-10-CM | POA: Diagnosis not present

## 2022-01-11 DIAGNOSIS — J411 Mucopurulent chronic bronchitis: Secondary | ICD-10-CM

## 2022-01-11 DIAGNOSIS — E118 Type 2 diabetes mellitus with unspecified complications: Secondary | ICD-10-CM

## 2022-01-11 DIAGNOSIS — I10 Essential (primary) hypertension: Secondary | ICD-10-CM

## 2022-01-11 DIAGNOSIS — Z794 Long term (current) use of insulin: Secondary | ICD-10-CM

## 2022-01-11 DIAGNOSIS — F331 Major depressive disorder, recurrent, moderate: Secondary | ICD-10-CM

## 2022-01-11 DIAGNOSIS — E2839 Other primary ovarian failure: Secondary | ICD-10-CM

## 2022-01-11 DIAGNOSIS — G43119 Migraine with aura, intractable, without status migrainosus: Secondary | ICD-10-CM | POA: Diagnosis not present

## 2022-01-11 DIAGNOSIS — Z720 Tobacco use: Secondary | ICD-10-CM

## 2022-01-11 MED ORDER — AIMOVIG 140 MG/ML ~~LOC~~ SOAJ: 140.0000 mg | SUBCUTANEOUS | 1 refills | Status: DC

## 2022-01-11 MED ORDER — INSULIN DETEMIR 100 UNIT/ML FLEXPEN: 30.0000 [IU] | PEN_INJECTOR | Freq: Every day | SUBCUTANEOUS | 0 refills | Status: DC

## 2022-01-11 MED ORDER — FREESTYLE LIBRE 2 READER DEVI: 1.0000 | Freq: Every day | 5 refills | Status: DC

## 2022-01-11 MED ORDER — IRBESARTAN 300 MG PO TABS: 300.0000 mg | ORAL_TABLET | Freq: Every day | ORAL | 0 refills | Status: DC

## 2022-01-11 MED ORDER — ESTRADIOL 0.0375 MG/24HR TD PTWK: 0.0375 mg | MEDICATED_PATCH | TRANSDERMAL | 0 refills | Status: DC

## 2022-01-11 MED ORDER — NURTEC 75 MG PO TBDP: 75.0000 mg | ORAL_TABLET | ORAL | 1 refills | Status: DC

## 2022-01-11 MED ORDER — BUPROPION HCL ER (XL) 150 MG PO TB24: 150.0000 mg | ORAL_TABLET | Freq: Every day | ORAL | 0 refills | Status: DC

## 2022-01-11 MED ORDER — BREZTRI AEROSPHERE 160-9-4.8 MCG/ACT IN AERO: 2.0000 | INHALATION_SPRAY | Freq: Two times a day (BID) | RESPIRATORY_TRACT | 1 refills | Status: DC

## 2022-01-11 NOTE — Progress Notes (Signed)
Subjective:  Patient ID: Ashley [name], female    DOB: [birth year]  Age: 56 y.o. MRN: [id number]  CC: Hypertension and Diabetes  This visit occurred during the SARS-CoV-2 public health emergency.  Safety protocols were in place, including screening questions prior to the visit, additional usage of staff PPE, and extensive cleaning of exam room while observing appropriate contact time as indicated for disinfecting solutions.    HPI KAIJA KOVACEVIC presents for f/up -  She is not using any nebulizers because they were too expensive.  She continues to smoke cigarettes.  She would like to start taking Wellbutrin to help with depression and nicotine cravings.  She complains of hot flashes and would like to use an estrogen patch.  She has had a recurrence of migraine headaches and would like to restart [name] and Nurtec.  Based on prescription refills she is not currently taking the ARB.  She is active and denies chest pain, shortness of breath, cough, wheezing, palpitations, or edema.  Outpatient Medications Prior to Visit  Medication Sig Dispense Refill   albuterol (VENTOLIN HFA) 108 (90 Base) MCG/ACT inhaler INHALE 2 PUFFS INTO THE LUNGS EVERY 6 HOURS AS NEEDED FOR WHEEZING OR SHORTNESS OF BREATH 6.7 g 3   AQUALANCE LANCETS 30G MISC   98   cholecalciferol (VITAMIN D3) 25 MCG (1000 UT) tablet Take 2,000 Units by mouth daily.     Continuous Blood Gluc Sensor (FREESTYLE LIBRE 2 SENSOR) MISC USE TO CHECK BLOOD SUGER DAILY 2 each 5   FEROSUL 325 (65 Fe) MG tablet TAKE 1 TABLET(325 MG) BY MOUTH TWICE DAILY WITH A MEAL 180 tablet 1   fludrocortisone (FLORINEF) 0.1 MG tablet TAKE 1 TABLET(0.1 MG) BY MOUTH DAILY 90 tablet 1   insulin aspart (FIASP) 100 UNIT/ML FlexTouch Pen Inject 10 Units into the skin 3 (three) times daily with meals. 27 mL 0   Insulin Pen Needle 32G X 6 MM MISC Inject 1 Act into the skin 4 (four) times daily. Use daily as directed 300 each 1   Lancet Devices (ADJUSTABLE LANCING DEVICE) MISC  Up to tid testing  98   metoprolol tartrate (LOPRESSOR) 50 MG tablet Take 1 tablet (50 mg total) by mouth 2 (two) times daily. 180 tablet 3   pantoprazole (PROTONIX) 40 MG tablet TAKE 1 TABLET(40 MG) BY MOUTH DAILY 90 tablet 1   potassium chloride SA (KLOR-CON) 20 MEQ tablet Take 1 tablet (20 mEq total) by mouth 3 (three) times daily. 270 tablet 0   promethazine (PHENERGAN) 12.5 MG tablet Take 1 tablet (12.5 mg total) by mouth every 6 (six) hours as needed for nausea or vomiting. 30 tablet 3   rosuvastatin (CRESTOR) 20 MG tablet TAKE 1 TABLET BY MOUTH EVERY DAY 90 tablet 1   thiamine (VITAMIN B-1) 50 MG tablet Take 1 tablet (50 mg total) by mouth daily. 90 tablet 1   traZODone (DESYREL) 150 MG tablet TAKE 1 TABLET(150 MG) BY MOUTH AT BEDTIME 90 tablet 1   venlafaxine XR (EFFEXOR-XR) 150 MG 24 hr capsule TAKE 1 CAPSULE(150 MG) BY MOUTH DAILY WITH BREAKFAST (Patient taking differently: Take 150 mg by mouth at bedtime.) 90 capsule 3   Continuous Blood Gluc Receiver (FREESTYLE LIBRE 2 READER) DEVI 1 Act by Does not apply route daily. 2 each 5   Dulaglutide (TRULICITY) 4.5 OI/7.1IW SOPN Inject 4.5 mg as directed once a week. 6 mL 1   formoterol (PERFOROMIST) 20 MCG/2ML nebulizer solution Take 2 mLs (20 mcg total) by nebulization  2 (two) times daily. 360 mL 1   irbesartan (AVAPRO) 150 MG tablet TAKE 1 TABLET(150 MG) BY MOUTH DAILY 90 tablet 0   LEVEMIR 100 UNIT/ML injection ADMINISTER 30 UNITS UNDER THE SKIN DAILY 10 mL 3   revefenacin (YUPELRI) 175 MCG/3ML nebulizer solution Take 3 mLs (175 mcg total) by nebulization daily. 270 mL 1   Rimegepant Sulfate (NURTEC) 75 MG TBDP Take 75 mg by mouth daily as needed. For migraines. Take as close to onset of migraine as possible. One daily maximum. 10 tablet 6   levETIRAcetam (KEPPRA) 500 MG tablet Take 500 mg by mouth 2 (two) times daily.     No facility-administered medications prior to visit.    ROS Review of Systems  Constitutional:  Negative for  chills, diaphoresis, fatigue and fever.  HENT: Negative.  Negative for sore throat.   Eyes: Negative.   Respiratory:  Negative for cough, shortness of breath and wheezing.   Cardiovascular:  Negative for chest pain, palpitations and leg swelling.  Gastrointestinal:  Negative for abdominal pain, constipation, diarrhea, nausea and vomiting.  Endocrine: Negative.   Genitourinary: Negative.  Negative for difficulty urinating, dysuria and hematuria.  Musculoskeletal: Negative.  Negative for myalgias.  Skin: Negative.  Negative for color change.  Neurological:  Positive for headaches. Negative for dizziness and weakness.  Hematological:  Negative for adenopathy. Does not bruise/bleed easily.  Psychiatric/Behavioral:  Positive for dysphoric mood. Negative for confusion, decreased concentration and sleep disturbance. The patient is not nervous/anxious.    Objective:  BP (!) 154/90    Pulse 90    Temp 98.2 F (36.8 C) (Oral)    Ht 5\' 2"  (1.575 m)    Wt 156 lb (70.8 kg)    LMP 02/12/2014    SpO2 97%    BMI 28.53 kg/m   BP Readings from Last 3 Encounters:  01/11/22 (!) 154/90  11/02/21 126/80  09/26/21 132/80    Wt Readings from Last 3 Encounters:  01/11/22 156 lb (70.8 kg)  11/02/21 157 lb (71.2 kg)  09/26/21 163 lb (73.9 kg)    Physical Exam Vitals reviewed.  Constitutional:      Appearance: She is not ill-appearing.  HENT:     Nose: Nose normal.     Mouth/Throat:     Mouth: Mucous membranes are moist.  Eyes:     General: No scleral icterus.    Conjunctiva/sclera: Conjunctivae normal.  Cardiovascular:     Rate and Rhythm: Normal rate and regular rhythm.     Heart sounds: No murmur heard. Pulmonary:     Effort: Pulmonary effort is normal.     Breath sounds: No stridor. No wheezing, rhonchi or rales.  Abdominal:     General: Abdomen is flat.     Palpations: There is no mass.     Tenderness: There is no abdominal tenderness. There is no guarding.     Hernia: No hernia is  present.  Musculoskeletal:     Cervical back: Neck supple.     Right lower leg: No edema.     Left lower leg: No edema.  Lymphadenopathy:     Cervical: No cervical adenopathy.  Skin:    General: Skin is warm.     Findings: No lesion or rash.  Neurological:     General: No focal deficit present.     Mental Status: She is alert. Mental status is at baseline.  Psychiatric:        Mood and Affect: Mood normal.  Behavior: Behavior normal.    Lab Results  Component Value Date   WBC 8.5 05/09/2021   HGB 13.6 05/09/2021   HCT 40.0 05/09/2021   PLT 321 05/09/2021   GLUCOSE 196 (H) 09/26/2021   CHOL 97 03/21/2021   TRIG 78 03/21/2021   HDL 32 (L) 03/21/2021   LDLDIRECT 152.1 01/19/2013   LDLCALC 49 03/21/2021   ALT 18 05/09/2021   AST 17 05/09/2021   NA 141 09/26/2021   K 3.2 (L) 09/26/2021   CL 104 09/26/2021   CREATININE 0.83 09/26/2021   BUN 13 09/26/2021   CO2 28 09/26/2021   TSH 1.670 10/31/2020   INR 1.0 05/09/2021   HGBA1C 7.3 (H) 09/26/2021   MICROALBUR <0.7 05/09/2020    VAS [place] LOWER EXTREMITY VENOUS (DVT)  Result Date: 11/03/2021  Lower Venous DVT Study Patient Name:  Guida KAILENE STEINHART  Date of Exam:   11/02/2021 Medical Rec #: [id number]    Accession #:    [id number] Date of Birth: 05-16-1966    Patient Gender: F Patient Age:   59 years Exam Location:  [facility] Procedure:      VAS [place] LOWER EXTREMITY VENOUS (DVT) Referring Phys: STACY BURNS --------------------------------------------------------------------------------  Indications: Acute pain to both calves, and a pulling feeling to the calves when standing x 1 day. Patient denies SOB.  Risk Factors: None identified. Comparison Study: NA Performing Technologist: Sharlett Iles RVT  Examination Guidelines: A complete evaluation includes B-mode imaging, spectral Doppler, color Doppler, and power Doppler as needed of all accessible portions of each vessel. Bilateral testing is considered an integral part of a complete  examination. Limited examinations for reoccurring indications may be performed as noted. The reflux portion of the exam is performed with the patient in reverse Trendelenburg.  +---------+---------------+---------+-----------+----------+--------------+  RIGHT     Compressibility Phasicity Spontaneity Properties Thrombus Aging  +---------+---------------+---------+-----------+----------+--------------+  CFV       Full            Yes       Yes                                    +---------+---------------+---------+-----------+----------+--------------+  SFJ       Full            Yes       Yes                                    +---------+---------------+---------+-----------+----------+--------------+  FV Prox   Full            Yes       Yes                                    +---------+---------------+---------+-----------+----------+--------------+  FV Mid    Full                                                             +---------+---------------+---------+-----------+----------+--------------+  FV Distal Full            Yes       Yes                                    +---------+---------------+---------+-----------+----------+--------------+  PFV       Full            Yes       Yes                                    +---------+---------------+---------+-----------+----------+--------------+  POP       Full            Yes       Yes                                    +---------+---------------+---------+-----------+----------+--------------+  PTV       Full            Yes       Yes                                    +---------+---------------+---------+-----------+----------+--------------+  PERO      Full            Yes       Yes                                    +---------+---------------+---------+-----------+----------+--------------+  Gastroc   Full                                                             +---------+---------------+---------+-----------+----------+--------------+  GSV       Full             Yes       Yes                                    +---------+---------------+---------+-----------+----------+--------------+   +---------+---------------+---------+-----------+----------+--------------+  LEFT      Compressibility Phasicity Spontaneity Properties Thrombus Aging  +---------+---------------+---------+-----------+----------+--------------+  CFV       Full            Yes       Yes                                    +---------+---------------+---------+-----------+----------+--------------+  SFJ       Full            Yes       Yes                                    +---------+---------------+---------+-----------+----------+--------------+  FV Prox   Full            Yes       Yes                                    +---------+---------------+---------+-----------+----------+--------------+  FV Mid    Full                                                             +---------+---------------+---------+-----------+----------+--------------+  FV Distal Full            Yes       Yes                                    +---------+---------------+---------+-----------+----------+--------------+  PFV       Full            Yes       Yes                                    +---------+---------------+---------+-----------+----------+--------------+  POP       Full            Yes       Yes                                    +---------+---------------+---------+-----------+----------+--------------+  PTV       Full            Yes       Yes                                    +---------+---------------+---------+-----------+----------+--------------+  PERO      Full            Yes       Yes                                    +---------+---------------+---------+-----------+----------+--------------+  Gastroc   Full                                                             +---------+---------------+---------+-----------+----------+--------------+  GSV       Full            Yes       Yes                                     +---------+---------------+---------+-----------+----------+--------------+    Findings reported to Dr. Quay Burow via Epic messaging at 1:55 pm.  Summary: BILATERAL: - No evidence of deep vein thrombosis seen in the lower extremities, bilaterally. - No evidence of superficial venous thrombosis in the lower extremities, bilaterally. -No evidence of popliteal cyst, bilaterally.   *See table(s) above for measurements and observations. Electronically signed by Ida Rogue MD on 11/03/2021 at 11:52:16 PM.    Final     Assessment & Plan:   Janika was seen today for hypertension and diabetes.  Diagnoses and all orders for this visit:  Essential hypertension- She has not achieved her blood pressure goal of 130/80.  Will restart the ARB. -     irbesartan (AVAPRO) 300 MG tablet; Take 1 tablet (300 mg total) by mouth daily. -     Basic metabolic panel; Future -     CBC with Differential/Platelet; Future -  TSH; Future  Hot flashes, menopausal -     estradiol (CLIMARA - DOSED IN MG/24 HR) 0.0375 mg/24hr patch; Place 1 patch (0.0375 mg total) onto the skin once a week.  Intractable migraine with aura without status migrainosus  Chronic migraine without aura, with intractable migraine, so stated, with status migrainosus -     Discontinue: Rimegepant Sulfate (NURTEC) 75 MG TBDP; Take 75 mg by mouth every other day. For migraines. Take as close to onset of migraine as possible. One daily maximum. -     Erenumab-aooe (AIMOVIG) 140 MG/ML SOAJ; Inject 140 mg into the skin every 30 (thirty) days.  Diabetes mellitus with complication ([place])- I will monitor her A1c. -     Continuous Blood Gluc Receiver (FREESTYLE LIBRE 2 READER) DEVI; 1 Act by Does not apply route daily. -     Basic metabolic panel; Future -     Microalbumin / creatinine urine ratio; Future -     Hemoglobin A1c; Future  Moderate episode of recurrent major depressive disorder (HCC) -     buPROPion (WELLBUTRIN XL) 150 MG 24 hr tablet; Take  1 tablet (150 mg total) by mouth daily.  Mucopurulent chronic bronchitis ([place]) -     Ambulatory referral to Pulmonology -     Budeson-Glycopyrrol-Formoterol (BREZTRI AEROSPHERE) 160-9-4.8 MCG/ACT AERO; Inhale 2 puffs into the lungs in the morning and at bedtime.  Insulin-requiring or dependent type II diabetes mellitus (HCC) -     insulin detemir (LEVEMIR) 100 UNIT/ML FlexPen; Inject 30 Units into the skin daily.  Tobacco abuse -     Ambulatory Referral for Lung Cancer Scre  Estrogen deficiency -     DG Bone Density; Future  Other orders -     Pneumococcal polysaccharide vaccine 23-valent greater than or equal to 2yo subcutaneous/IM   I have discontinued Erinne B. Rodenberg's levETIRAcetam, Nurtec, revefenacin, formoterol, irbesartan, Trulicity, and Nurtec. I am also having her start on buPROPion, estradiol, irbesartan, insulin detemir, and Breztri Aerosphere. Additionally, I am having her maintain her Adjustable Lancing Device, AquaLance Lancets 30G, cholecalciferol, venlafaxine XR, thiamine, promethazine, pantoprazole, albuterol, FreeStyle Libre 2 Sensor, potassium chloride SA, FeroSul, fludrocortisone, metoprolol tartrate, insulin aspart, Insulin Pen Needle, traZODone, rosuvastatin, FreeStyle Libre 2 Reader, and Aimovig.  Meds ordered this encounter  Medications   DISCONTD: Rimegepant Sulfate (NURTEC) 75 MG TBDP    Sig: Take 75 mg by mouth every other day. For migraines. Take as close to onset of migraine as possible. One daily maximum.    Dispense:  46 tablet    Refill:  1   Continuous Blood Gluc Receiver (FREESTYLE LIBRE 2 READER) DEVI    Sig: 1 Act by Does not apply route daily.    Dispense:  2 each    Refill:  5   buPROPion (WELLBUTRIN XL) 150 MG 24 hr tablet    Sig: Take 1 tablet (150 mg total) by mouth daily.    Dispense:  90 tablet    Refill:  0   estradiol (CLIMARA - DOSED IN MG/24 HR) 0.0375 mg/24hr patch    Sig: Place 1 patch (0.0375 mg total) onto the skin once a week.     Dispense:  4 patch    Refill:  0   irbesartan (AVAPRO) 300 MG tablet    Sig: Take 1 tablet (300 mg total) by mouth daily.    Dispense:  90 tablet    Refill:  0   Erenumab-aooe (AIMOVIG) 140 MG/ML SOAJ    Sig: Inject 140  mg into the skin every 30 (thirty) days.    Dispense:  12 mL    Refill:  1   insulin detemir (LEVEMIR) 100 UNIT/ML FlexPen    Sig: Inject 30 Units into the skin daily.    Dispense:  27 mL    Refill:  0   Budeson-Glycopyrrol-Formoterol (BREZTRI AEROSPHERE) 160-9-4.8 MCG/ACT AERO    Sig: Inhale 2 puffs into the lungs in the morning and at bedtime.    Dispense:  32.1 g    Refill:  1     Follow-up: Return in about 3 months (around 04/11/2022).  Scarlette Calico, MD

## 2022-01-11 NOTE — Patient Instructions (Signed)

## 2022-01-15 NOTE — Progress Notes (Signed)
[facility] [place]  ASSESSMENT / PLAN: Ashley [name] is a 56 y.o. female with bilateral cramping calf discomfort both at rest and with ambulation.  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  Her physical exam and noninvasive testing is reassuring today.  She does describe a history of sciatica.  I encouraged her to follow-up with her primary care physician for further testing.  I encouraged her to stop smoking, and to maintain good control of her diabetes to avoid atherosclerotic complication.  She can follow-up with me on an as needed basis.  CHIEF COMPLAINT: cramping leg pain  HISTORY OF PRESENT ILLNESS: Ashley [name] is a 56 y.o. female who presents to clinic for evaluation of bilateral cramping discomfort in her calves.  This happens when she walks.  This also happens when she stands for a long time.  She notices discomfort in her legs when she changes positions.  She has a history of sciatica.  She reports pain in her toes which radiates up her legs.  The pain in her toes does not seem related to activity.  She does not report typical symptoms of ischemic rest pain.  She has no ulcers about her feet.  VASCULAR SURGICAL HISTORY: None  VASCULAR RISK FACTORS: Negative history of stroke / transient ischemic attack. Negative history of coronary artery disease.  Positive history of diabetes mellitus. Last A1c 7.3. Positive history of smoking. + actively smoking. Positive history of hypertension.  Negative history of chronic kidney disease.   Positive history of chronic obstructive pulmonary disease.  FUNCTIONAL STATUS: ECOG performance status: (0) Fully active, able to carry on all predisease performance without restriction Ambulatory status: Ambulatory within the community without limits  Past Medical History:  Diagnosis Date   Anxiety    Atypical ductal hyperplasia of left breast    Bronchitis    uses inhaler if needed for bronchitis, lasted used -2014    Cataract    bilateral   Chronic headaches    migraines - in past, uses Phenergan for nausea    COPD (chronic obstructive pulmonary disease) ([place])    Deficiency anemia 12/30/2017   Depression    Diabetes mellitus without complication ([place])    taken off Metformin since 04/2014, HgbA1C - normal, will follow up with PCP- Dr. Deborra Medina, 07/2014   GERD (gastroesophageal reflux disease)    H/O exercise stress test 2011   done at [facility]- told that it was WNL, done due to pt. having panic attacks    History of blood transfusion 1966-09-26   at birth in [place], [place], unsure number of units   PONV (postoperative nausea and vomiting)    Poor dentition    very poor oral health    SVD (spontaneous vaginal delivery)    x 3   Tobacco abuse    Wears glasses     Past Surgical History:  Procedure Laterality Date   ABDOMINAL HYSTERECTOMY     APPENDECTOMY  1988   BREAST LUMPECTOMY WITH RADIOACTIVE SEED LOCALIZATION Left 03/17/2020   Procedure: LEFT BREAST LUMPECTOMY WITH RADIOACTIVE SEED LOCALIZATION;  Surgeon: Coralie Keens, MD;  Location: [facility];  Service: General;  Laterality: Left;  LMA   CARDIAC CATHETERIZATION N/A 08/17/2016   Procedure: Left Heart Cath and Coronary Angiography;  Surgeon: Burnell Blanks, MD;  Location: [facility];  Service: Cardiovascular;  Laterality: N/A;   CHOLECYSTECTOMY N/A 06/07/2014   Procedure: LAPAROSCOPIC CHOLECYSTECTOMY;  Surgeon: Ralene Ok, MD;  Location: [place];  Service: General;  Laterality: N/A;   CHOLECYSTECTOMY  June 07 2014   COLONOSCOPY  02/16/2014   normal    ESOPHAGOGASTRODUODENOSCOPY  12/28/2013   KNEE ARTHROSCOPY  1995   left   LAPAROSCOPIC ASSISTED VAGINAL HYSTERECTOMY N/A 04/15/2014   Procedure: LAPAROSCOPIC ASSISTED VAGINAL HYSTERECTOMY;  Surgeon: Marylynn Pearson, MD;  Location: [facility];  Service: Gynecology;  Laterality: N/A;   LAPAROSCOPIC BILATERAL SALPINGO OOPHERECTOMY Bilateral 04/15/2014   Procedure: LAPAROSCOPIC BILATERAL  SALPINGO OOPHORECTOMY;  Surgeon: Marylynn Pearson, MD;  Location: [facility];  Service: Gynecology;  Laterality: Bilateral;   TUBAL LIGATION      Family History  Problem Relation Age of Onset   Diabetes Mother    Hyperlipidemia Mother    Hypertension Mother    Anxiety disorder Mother    Depression Mother    Drug abuse Mother    Stroke Mother        x3   Prostate cancer Father    Alcohol abuse Father    Lung cancer Maternal Grandfather        smoked   Emphysema Maternal Grandfather        smoked   COPD Maternal Grandmother        never smoked    Dementia Maternal Grandmother    Thyroid cancer Paternal Aunt    Colon cancer Neg Hx    Rectal cancer Neg Hx    Stomach cancer Neg Hx    Migraines Neg Hx     Social History   Socioeconomic History   Marital status: Married    Spouse name: Alvester Chou   Number of children: 6   Years of education: 12   Highest education level: Not on file  Occupational History   Occupation:      Comment: helps run a friends business  Tobacco Use   Smoking status: Every Day    Packs/day: 2.00    Years: 35.00    Pack years: 70.00    Types: Cigarettes   Smokeless tobacco: Never  Vaping Use   Vaping Use: Never used  Substance and Sexual Activity   Alcohol use: No    Alcohol/week: 0.0 standard drinks   Drug use: No   Sexual activity: Yes    Partners: Male    Birth control/protection: Surgical  Other Topics Concern   Not on file  Social History Narrative   5 living children, one child is a crack cocaine addict who often breaks into their house to steal money. He is currently in jail. One child died of cerebral palsy.   Caffeine use: Dr Malachi Bonds (3 per day)   2 cups coffee per day   Online student, currently not working   Social Determinants of Radio broadcast assistant Strain: Not on file  Food Insecurity: Not on file  Transportation Needs: Not on file  Physical Activity: Not on file  Stress: Not on file  Social Connections: Not on file   Intimate Partner Violence: Not on file    Allergies  Allergen Reactions   Penicillins Anaphylaxis and Swelling    Throat swells Has patient had a PCN reaction causing immediate rash, facial/tongue/throat swelling, SOB or lightheadedness with hypotension: Yes Has patient had a PCN reaction causing severe rash involving mucus membranes or skin necrosis: No Has patient had a PCN reaction that required hospitalization: No Has patient had a PCN reaction occurring within the last 10 years: No If all of the above answers are "NO", then may proceed with Cephalosporin use.  Topamax [Topiramate] Other (See Comments)    Hands and feet  go numb   Metformin And Related Other (See Comments)    GI side effects     Current Outpatient Medications  Medication Sig Dispense Refill   albuterol (VENTOLIN HFA) 108 (90 Base) MCG/ACT inhaler INHALE 2 PUFFS INTO THE LUNGS EVERY 6 HOURS AS NEEDED FOR WHEEZING OR SHORTNESS OF BREATH 6.7 g 3   AQUALANCE LANCETS 30G MISC   98   Budeson-Glycopyrrol-Formoterol (BREZTRI AEROSPHERE) 160-9-4.8 MCG/ACT AERO Inhale 2 puffs into the lungs in the morning and at bedtime. 32.1 g 1   buPROPion (WELLBUTRIN XL) 150 MG 24 hr tablet Take 1 tablet (150 mg total) by mouth daily. 90 tablet 0   cholecalciferol (VITAMIN D3) 25 MCG (1000 UT) tablet Take 2,000 Units by mouth daily.     Continuous Blood Gluc Receiver (FREESTYLE LIBRE 2 READER) DEVI 1 Act by Does not apply route daily. 2 each 5   Continuous Blood Gluc Sensor (FREESTYLE LIBRE 2 SENSOR) MISC USE TO CHECK BLOOD SUGER DAILY 2 each 5   Erenumab-aooe (AIMOVIG) 140 MG/ML SOAJ Inject 140 mg into the skin every 30 (thirty) days. 12 mL 1   estradiol (CLIMARA - DOSED IN MG/24 HR) 0.0375 mg/24hr patch Place 1 patch (0.0375 mg total) onto the skin once a week. 4 patch 0   FEROSUL 325 (65 Fe) MG tablet TAKE 1 TABLET(325 MG) BY MOUTH TWICE DAILY WITH A MEAL 180 tablet 1   fludrocortisone (FLORINEF) 0.1 MG tablet TAKE 1 TABLET(0.1  MG) BY MOUTH DAILY 90 tablet 1   insulin aspart (FIASP) 100 UNIT/ML FlexTouch Pen Inject 10 Units into the skin 3 (three) times daily with meals. 27 mL 0   insulin detemir (LEVEMIR) 100 UNIT/ML FlexPen Inject 30 Units into the skin daily. 27 mL 0   Insulin Pen Needle 32G X 6 MM MISC Inject 1 Act into the skin 4 (four) times daily. Use daily as directed 300 each 1   irbesartan (AVAPRO) 300 MG tablet Take 1 tablet (300 mg total) by mouth daily. 90 tablet 0   Lancet Devices (ADJUSTABLE LANCING DEVICE) MISC Up to tid testing  98   metoprolol tartrate (LOPRESSOR) 50 MG tablet Take 1 tablet (50 mg total) by mouth 2 (two) times daily. 180 tablet 3   pantoprazole (PROTONIX) 40 MG tablet TAKE 1 TABLET(40 MG) BY MOUTH DAILY 90 tablet 1   potassium chloride SA (KLOR-CON) 20 MEQ tablet Take 1 tablet (20 mEq total) by mouth 3 (three) times daily. 270 tablet 0   promethazine (PHENERGAN) 12.5 MG tablet Take 1 tablet (12.5 mg total) by mouth every 6 (six) hours as needed for nausea or vomiting. 30 tablet 3   rosuvastatin (CRESTOR) 20 MG tablet TAKE 1 TABLET BY MOUTH EVERY DAY 90 tablet 1   thiamine (VITAMIN B-1) 50 MG tablet Take 1 tablet (50 mg total) by mouth daily. 90 tablet 1   traZODone (DESYREL) 150 MG tablet TAKE 1 TABLET(150 MG) BY MOUTH AT BEDTIME 90 tablet 1   venlafaxine XR (EFFEXOR-XR) 150 MG 24 hr capsule TAKE 1 CAPSULE(150 MG) BY MOUTH DAILY WITH BREAKFAST (Patient taking differently: Take 150 mg by mouth at bedtime.) 90 capsule 3   No current facility-administered medications for this visit.    REVIEW OF SYSTEMS:  [X]  denotes positive finding, [ ]  denotes negative finding Cardiac  Comments:  Chest pain or chest pressure:    Shortness of breath upon exertion:    Short of breath  when lying flat:    Irregular heart rhythm:        Vascular    Pain in calf, thigh, or hip brought on by ambulation: x   Pain in feet at night that wakes you up from your sleep:     Blood clot in your veins:    Leg  swelling:         Pulmonary    Oxygen at home:    Productive cough:     Wheezing:         Neurologic    Sudden weakness in arms or legs:     Sudden numbness in arms or legs:     Sudden onset of difficulty speaking or slurred speech:    Temporary loss of vision in one eye:     Problems with dizziness:         Gastrointestinal    Blood in stool:     Vomited blood:         Genitourinary    Burning when urinating:     Blood in urine:        Psychiatric    Major depression:         Hematologic    Bleeding problems:    Problems with blood clotting too easily:        Skin    Rashes or ulcers:        Constitutional    Fever or chills:      PHYSICAL EXAM Vitals:   01/16/22 0939  BP: 140/81  Pulse: 64  Resp: 20  Temp: 98.6 F (37 C)  SpO2: 98%  Weight: 157 lb (71.2 kg)  Height: 5\' 2"  (1.575 m)    Constitutional: Well appearing.  No distress. Appears well nourished.  Neurologic: CN intact.  No focal findings.  No sensory loss. Psychiatric: Mood and affect symmetric and appropriate. Eyes: No icterus. No co njunctival pallor. Ears, nose, throat:  mucous membranes moist. Midline trachea.  Cardiac: Regular rate and rhythm.  Respiratory: Unlabored. Abdominal: Soft, non-tender, non-distended.  Peripheral vascular: 2+ posterior tibial pulses bilaterally Extremity: No edema.  No cyanosis.  No pallor.  Skin: No gangrene.  No ulceration.  Lymphatic: No Stemmer's sign.  No palpable lymphadenopathy.  PERTINENT LABORATORY AND RADIOLOGIC DATA  Most recent CBC CBC Latest Ref Rng & Units 05/09/2021 05/09/2021 03/20/2021  WBC 4.0 - 10.5 K/uL - 8.5 -  Hemoglobin 12.0 - 15.0 g/dL 13.6 14.5 13.9  Hematocrit 36.0 - 46.0 % 40.0 42.4 41.0  Platelets 150 - 400 K/uL - 321 -     Most recent CMP CMP Latest Ref Rng & Units 09/26/2021 05/09/2021 05/09/2021  Glucose 70 - 99 mg/dL 196(H) 151(H) 151(H)  BUN 6 - 23 mg/dL 13 9 9   Creatinine 0.40 - 1.20 mg/dL 0.83 0.60 0.69  Sodium 135 - 145  mEq/L 141 142 140  Potassium 3.5 - 5.1 mEq/L 3.2(L) 3.1(L) 3.1(L)  Chloride 96 - 112 mEq/L 104 101 101  CO2 19 - 32 mEq/L 28 - 29  Calcium 8.4 - 10.5 mg/dL 9.0 - 9.0  Total Protein 6.5 - 8.1 g/dL - - 6.5  Total Bilirubin 0.3 - 1.2 mg/dL - - 0.6  Alkaline Phos 38 - 126 U/L - - 105  AST 15 - 41 U/L - - 17  ALT 0 - 44 U/L - - 18    Renal function CrCl cannot be calculated (Patient's most recent lab result is older than the maximum 21 days allowed.).  Hemoglobin  A1C (no units)  Date Value  10/05/2019 7.1   Hgb A1c MFr Bld (%)  Date Value  09/26/2021 7.3 (H)    LDL Cholesterol  Date Value Ref Range Status  03/21/2021 49 0 - 99 mg/dL Final    Comment:           Total Cholesterol/HDL:CHD Risk Coronary Heart Disease Risk Table                     Men   Women  1/2 Average Risk   3.4   3.3  Average Risk       5.0   4.4  2 X Average Risk   9.6   7.1  3 X Average Risk  23.4   11.0        Use the calculated Patient Ratio above and the CHD Risk Table to determine the patient's CHD Risk.        ATP III CLASSIFICATION (LDL):  <100     mg/dL   Optimal  100-129  mg/dL   Near or Above                    Optimal  130-159  mg/dL   Borderline  160-189  mg/dL   High  >190     mg/dL   Very High Performed at [facility] [address] [place], [place] [address]    Direct LDL  Date Value Ref Range Status  01/19/2013 152.1 mg/dL Final    Comment:    Optimal:  <100 mg/dLNear or Above Optimal:  100-129 mg/dLBorderline High:  130-159 mg/dLHigh:  160-189 mg/dLVery High:  >190 mg/dL     +-------+-----------+-----------+------------+------------+   ABI/TBI Today's ABI Today's TBI Previous ABI Previous TBI   +-------+-----------+-----------+------------+------------+   Right   1.22        1.3         1.17         0.98           +-------+-----------+-----------+------------+------------+   Left    1.24        1.17        1.13         1.07            +-------+-----------+-----------+------------+------------+    Yevonne Aline. Stanford Breed, MD [facility] Phone Number: [phone] 01/16/2022 12:27 PM  Total time spent on preparing this encounter including chart review, data review, collecting history, examining the patient, coordinating care for this new patient, 60 minutes.  Portions of this report may have been transcribed using voice recognition software.  Every effort has been made to ensure accuracy; however, inadvertent computerized transcription errors may still be present.

## 2022-01-16 ENCOUNTER — Ambulatory Visit (INDEPENDENT_AMBULATORY_CARE_PROVIDER_SITE_OTHER): Payer: 59 | Admitting: Vascular Surgery

## 2022-01-16 ENCOUNTER — Ambulatory Visit (HOSPITAL_COMMUNITY)
Admission: RE | Admit: 2022-01-16 | Discharge: 2022-01-16 | Disposition: A | Discharge status: Home / Self Care | Payer: 59 | Source: Ambulatory Visit | Attending: Vascular Surgery | Admitting: Vascular Surgery

## 2022-01-16 ENCOUNTER — Encounter: Payer: Self-pay | Admitting: Vascular Surgery

## 2022-01-16 ENCOUNTER — Other Ambulatory Visit: Payer: Self-pay

## 2022-01-16 VITALS — BP 140/81 | HR 64 | Temp 98.6°F | Resp 20 | Ht 62.0 in | Wt 157.0 lb

## 2022-01-16 DIAGNOSIS — I739 Peripheral vascular disease, unspecified: Secondary | ICD-10-CM | POA: Insufficient documentation

## 2022-01-16 DIAGNOSIS — M48062 Spinal stenosis, lumbar region with neurogenic claudication: Secondary | ICD-10-CM

## 2022-01-25 ENCOUNTER — Other Ambulatory Visit: Payer: Self-pay

## 2022-01-25 ENCOUNTER — Encounter: Payer: Self-pay | Admitting: Nurse Practitioner

## 2022-01-25 ENCOUNTER — Ambulatory Visit (INDEPENDENT_AMBULATORY_CARE_PROVIDER_SITE_OTHER): Payer: 59 | Admitting: Nurse Practitioner

## 2022-01-25 VITALS — BP 130/72 | HR 74 | Temp 98.1°F | Ht 62.0 in | Wt 156.8 lb

## 2022-01-25 DIAGNOSIS — N3000 Acute cystitis without hematuria: Secondary | ICD-10-CM

## 2022-01-25 DIAGNOSIS — J0100 Acute maxillary sinusitis, unspecified: Secondary | ICD-10-CM | POA: Diagnosis not present

## 2022-01-25 DIAGNOSIS — R300 Dysuria: Secondary | ICD-10-CM

## 2022-01-25 DIAGNOSIS — H9191 Unspecified hearing loss, right ear: Secondary | ICD-10-CM

## 2022-01-25 LAB — POCT URINALYSIS DIPSTICK
Bilirubin, UA: NEGATIVE
Blood, UA: NEGATIVE
Glucose, UA: NEGATIVE
Ketones, UA: NEGATIVE
Leukocytes, UA: NEGATIVE
Nitrite, UA: NEGATIVE
Protein, UA: NEGATIVE
Spec Grav, UA: 1.02 (ref 1.010–1.025)
Urobilinogen, UA: 0.2 E.U./dL
pH, UA: 6 (ref 5.0–8.0)

## 2022-01-25 MED ORDER — DOXYCYCLINE HYCLATE 100 MG PO TABS: 100.0000 mg | ORAL_TABLET | Freq: Two times a day (BID) | ORAL | 0 refills | Status: DC

## 2022-01-25 MED ORDER — PHENAZOPYRIDINE HCL 100 MG PO TABS: 100.0000 mg | ORAL_TABLET | Freq: Three times a day (TID) | ORAL | 0 refills | Status: DC | PRN

## 2022-01-25 MED ORDER — FLUTICASONE PROPIONATE 50 MCG/ACT NA SUSP: 2.0000 | Freq: Every day | NASAL | 6 refills | Status: DC

## 2022-01-25 NOTE — Progress Notes (Signed)
Subjective:  Patient ID: Ashley [name], female    DOB: [birth year]  Age: 56 y.o. MRN: [id number]  CC:  Chief Complaint  Patient presents with   Urinary Tract Infection    Pt states she is having some burning & pressure when urinating    Sinus Problem      HPI  This patient arrives today for the above.  Dysuria: Symptoms have been ongoing for about 6 days.  She reports frequency and burning with urination.  She tells me she is had UTIs in the past and it feels similar to 1 now.  She is not taking anything for symptom management.  She denies visible hematuria, nausea, vomiting, fever however she has experienced some chills but reports this may be related to menopausal symptoms.  Sinus congestion: Symptoms have been present for 2 weeks.  She is experiencing nasal congestion, productive cough, and stable shortness of breath related to her chronic bronchitis.  As stated above she has also experienced chills.  Hearing reduction: She reports she is having difficulty with hearing.  She was referred to ENT in the past but for some reason this appointment was never kept.  She would like referral again today possible.  Past Medical History:  Diagnosis Date   Anxiety    Atypical ductal hyperplasia of left breast    Bronchitis    uses inhaler if needed for bronchitis, lasted used -2014   Cataract    bilateral   Chronic headaches    migraines - in past, uses Phenergan for nausea    COPD (chronic obstructive pulmonary disease) ([place])    Deficiency anemia 12/30/2017   Depression    Diabetes mellitus without complication ([place])    taken off Metformin since 04/2014, HgbA1C - normal, will follow up with PCP- Dr. Deborra Medina, 07/2014   GERD (gastroesophageal reflux disease)    H/O exercise stress test 2011   done at [facility]- told that it was WNL, done due to pt. having panic attacks    History of blood transfusion 29-Sep-1966   at birth in [place], [place], unsure number of units   PONV (postoperative  nausea and vomiting)    Poor dentition    very poor oral health    SVD (spontaneous vaginal delivery)    x 3   Tobacco abuse    Wears glasses       Family History  Problem Relation Age of Onset   Diabetes Mother    Hyperlipidemia Mother    Hypertension Mother    Anxiety disorder Mother    Depression Mother    Drug abuse Mother    Stroke Mother        x3   Prostate cancer Father    Alcohol abuse Father    Lung cancer Maternal Grandfather        smoked   Emphysema Maternal Grandfather        smoked   COPD Maternal Grandmother        never smoked    Dementia Maternal Grandmother    Thyroid cancer Paternal Aunt    Colon cancer Neg Hx    Rectal cancer Neg Hx    Stomach cancer Neg Hx    Migraines Neg Hx     Social History   Social History Narrative   5 living children, one child is a crack cocaine addict who often breaks into their house to steal money. He is currently in jail. One child died of cerebral palsy.  Caffeine use: Dr Malachi Bonds (3 per day)   2 cups coffee per day   Online student, currently not working   Social History   Tobacco Use   Smoking status: Every Day    Packs/day: 2.00    Years: 35.00    Pack years: 70.00    Types: Cigarettes   Smokeless tobacco: Never  Substance Use Topics   Alcohol use: No    Alcohol/week: 0.0 standard drinks     Current Meds  Medication Sig   albuterol (VENTOLIN HFA) 108 (90 Base) MCG/ACT inhaler INHALE 2 PUFFS INTO THE LUNGS EVERY 6 HOURS AS NEEDED FOR WHEEZING OR SHORTNESS OF BREATH   AQUALANCE LANCETS 30G MISC    Budeson-Glycopyrrol-Formoterol (BREZTRI AEROSPHERE) 160-9-4.8 MCG/ACT AERO Inhale 2 puffs into the lungs in the morning and at bedtime.   buPROPion (WELLBUTRIN XL) 150 MG 24 hr tablet Take 1 tablet (150 mg total) by mouth daily.   cholecalciferol (VITAMIN D3) 25 MCG (1000 UT) tablet Take 2,000 Units by mouth daily.   Continuous Blood Gluc Receiver (FREESTYLE LIBRE 2 READER) DEVI 1 Act by Does not apply  route daily.   Continuous Blood Gluc Sensor (FREESTYLE LIBRE 2 SENSOR) MISC USE TO CHECK BLOOD SUGER DAILY   doxycycline (VIBRA-TABS) 100 MG tablet Take 1 tablet (100 mg total) by mouth 2 (two) times daily.   Erenumab-aooe (AIMOVIG) 140 MG/ML SOAJ Inject 140 mg into the skin every 30 (thirty) days.   estradiol (CLIMARA - DOSED IN MG/24 HR) 0.0375 mg/24hr patch Place 1 patch (0.0375 mg total) onto the skin once a week.   FEROSUL 325 (65 Fe) MG tablet TAKE 1 TABLET(325 MG) BY MOUTH TWICE DAILY WITH A MEAL   fludrocortisone (FLORINEF) 0.1 MG tablet TAKE 1 TABLET(0.1 MG) BY MOUTH DAILY   fluticasone (FLONASE) 50 MCG/ACT nasal spray Place 2 sprays into both nostrils daily.   insulin aspart (FIASP) 100 UNIT/ML FlexTouch Pen Inject 10 Units into the skin 3 (three) times daily with meals.   insulin detemir (LEVEMIR) 100 UNIT/ML FlexPen Inject 30 Units into the skin daily.   Insulin Pen Needle 32G X 6 MM MISC Inject 1 Act into the skin 4 (four) times daily. Use daily as directed   irbesartan (AVAPRO) 300 MG tablet Take 1 tablet (300 mg total) by mouth daily.   Lancet Devices (ADJUSTABLE LANCING DEVICE) MISC Up to tid testing   metoprolol tartrate (LOPRESSOR) 50 MG tablet Take 1 tablet (50 mg total) by mouth 2 (two) times daily.   pantoprazole (PROTONIX) 40 MG tablet TAKE 1 TABLET(40 MG) BY MOUTH DAILY   phenazopyridine (PYRIDIUM) 100 MG tablet Take 1 tablet (100 mg total) by mouth 3 (three) times daily as needed for pain.   potassium chloride SA (KLOR-CON) 20 MEQ tablet Take 1 tablet (20 mEq total) by mouth 3 (three) times daily.   promethazine (PHENERGAN) 12.5 MG tablet Take 1 tablet (12.5 mg total) by mouth every 6 (six) hours as needed for nausea or vomiting.   rosuvastatin (CRESTOR) 20 MG tablet TAKE 1 TABLET BY MOUTH EVERY DAY   thiamine (VITAMIN B-1) 50 MG tablet Take 1 tablet (50 mg total) by mouth daily.   traZODone (DESYREL) 150 MG tablet TAKE 1 TABLET(150 MG) BY MOUTH AT BEDTIME   venlafaxine  XR (EFFEXOR-XR) 150 MG 24 hr capsule TAKE 1 CAPSULE(150 MG) BY MOUTH DAILY WITH BREAKFAST (Patient taking differently: Take 150 mg by mouth at bedtime.)    ROS:  Review of Systems  Constitutional:  Positive for  chills. Negative for fever.  HENT:  Positive for congestion ((+) PND).   Respiratory:  Positive for cough and sputum production. Negative for shortness of breath.   Cardiovascular:  Negative for chest pain.  Gastrointestinal:  Negative for diarrhea, nausea and vomiting.  Genitourinary:  Positive for dysuria and frequency. Negative for flank pain and hematuria.  Neurological:  Positive for headaches. Negative for dizziness.    Objective:   Today's Vitals: BP 130/72 (BP Location: Left Arm)    Pulse 74    Temp 98.1 F (36.7 C) (Oral)    Ht 5\' 2"  (1.575 m)    Wt 156 lb 12.8 oz (71.1 kg)    LMP 02/12/2014    SpO2 97%    BMI 28.68 kg/m  Vitals with BMI 01/25/2022 01/16/2022 01/11/2022  Height 5\' 2"  5\' 2"  5\' 2"   Weight 156 lbs 13 oz 157 lbs 156 lbs  BMI 28.67 68.03 21.22  Systolic 482 500 370  Diastolic 72 81 90  Pulse 74 64 90  Some encounter information is confidential and restricted. Go to Review Flowsheets activity to see all data.     Physical Exam Vitals reviewed.  Constitutional:      General: She is not in acute distress.    Appearance: Normal appearance.  HENT:     Head: Normocephalic and atraumatic.     Right Ear: Hearing, ear canal and external ear normal. There is impacted cerumen.     Left Ear: Hearing, tympanic membrane, ear canal and external ear normal.     Ears:     Comments: (+) bilateral maxillary tenderness Neck:     Vascular: No carotid bruit.  Cardiovascular:     Rate and Rhythm: Normal rate and regular rhythm.     Pulses: Normal pulses.     Heart sounds: Normal heart sounds.  Pulmonary:     Effort: Pulmonary effort is normal. No respiratory distress.     Breath sounds: Normal breath sounds.  Lymphadenopathy:     Cervical: No cervical adenopathy.   Skin:    General: Skin is warm and dry.  Neurological:     General: No focal deficit present.     Mental Status: She is alert and oriented to person, place, and time.  Psychiatric:        Mood and Affect: Mood normal.        Behavior: Behavior normal.        Judgment: Judgment normal.         Assessment and Plan   1. Acute cystitis without hematuria   2. Dysuria   3. Hearing difficulty of right ear   4. Acute non-recurrent maxillary sinusitis      Plan: 1.,  2.  Point-of-care urinalysis is negative for UTI today.  However due to her symptoms we will send off her urine culture to verify.  In the meantime I recommend she is Pyridium as needed to treat her symptoms. 3.  We will refer to ENT for further evaluation and management of her hearing loss. 4.  Because symptoms of been present for 2 weeks we will treat with antibiotic therapy.  She is allergic to penicillin so will prescribe doxycycline.  We will also prescribe Flonase nasal spray.  She was told to call the office if symptoms persist or worsen despite taking these medications.   Tests ordered Orders Placed This Encounter  Procedures   Urine Culture   Urinalysis with Culture, if indicated   Ambulatory referral to ENT  POCT Urinalysis Dipstick      Meds ordered this encounter  Medications   phenazopyridine (PYRIDIUM) 100 MG tablet    Sig: Take 1 tablet (100 mg total) by mouth 3 (three) times daily as needed for pain.    Dispense:  10 tablet    Refill:  0    Order Specific Question:   Supervising Provider    Answer:   BURNS, Claudina Lick [[id number]]   doxycycline (VIBRA-TABS) 100 MG tablet    Sig: Take 1 tablet (100 mg total) by mouth 2 (two) times daily.    Dispense:  20 tablet    Refill:  0    Order Specific Question:   Supervising Provider    Answer:   BURNS, STACY J [[id number]]   fluticasone (FLONASE) 50 MCG/ACT nasal spray    Sig: Place 2 sprays into both nostrils daily.    Dispense:  16 g    Refill:  6     Order Specific Question:   Supervising Provider    Answer:   Binnie Rail [id number]    Patient to follow-up with PCP as scheduled, or sooner as needed.  Ailene Ards, NP

## 2022-01-27 LAB — URINE CULTURE

## 2022-01-30 ENCOUNTER — Other Ambulatory Visit: Payer: 59

## 2022-02-12 ENCOUNTER — Other Ambulatory Visit: Payer: Self-pay | Admitting: Internal Medicine

## 2022-02-12 DIAGNOSIS — N951 Menopausal and female climacteric states: Secondary | ICD-10-CM

## 2022-02-13 ENCOUNTER — Other Ambulatory Visit: Payer: Self-pay

## 2022-02-13 ENCOUNTER — Encounter: Payer: Self-pay | Admitting: Internal Medicine

## 2022-02-13 ENCOUNTER — Ambulatory Visit (INDEPENDENT_AMBULATORY_CARE_PROVIDER_SITE_OTHER): Payer: 59 | Admitting: Internal Medicine

## 2022-02-13 VITALS — BP 132/78 | HR 79 | Temp 98.1°F | Resp 16 | Ht 62.0 in | Wt 152.0 lb

## 2022-02-13 DIAGNOSIS — I10 Essential (primary) hypertension: Secondary | ICD-10-CM | POA: Diagnosis not present

## 2022-02-13 DIAGNOSIS — E611 Iron deficiency: Secondary | ICD-10-CM

## 2022-02-13 DIAGNOSIS — J0100 Acute maxillary sinusitis, unspecified: Secondary | ICD-10-CM

## 2022-02-13 DIAGNOSIS — Z794 Long term (current) use of insulin: Secondary | ICD-10-CM

## 2022-02-13 DIAGNOSIS — E119 Type 2 diabetes mellitus without complications: Secondary | ICD-10-CM | POA: Diagnosis not present

## 2022-02-13 DIAGNOSIS — E876 Hypokalemia: Secondary | ICD-10-CM

## 2022-02-13 DIAGNOSIS — G43119 Migraine with aura, intractable, without status migrainosus: Secondary | ICD-10-CM

## 2022-02-13 LAB — CBC WITH DIFFERENTIAL/PLATELET
Basophils Absolute: 0 10*3/uL (ref 0.0–0.1)
Basophils Relative: 0.4 % (ref 0.0–3.0)
Eosinophils Absolute: 0.2 10*3/uL (ref 0.0–0.7)
Eosinophils Relative: 1.9 % (ref 0.0–5.0)
HCT: 44.5 % (ref 36.0–46.0)
Hemoglobin: 15.1 g/dL — ABNORMAL HIGH (ref 12.0–15.0)
Lymphocytes Relative: 33 % (ref 12.0–46.0)
Lymphs Abs: 2.7 10*3/uL (ref 0.7–4.0)
MCHC: 33.8 g/dL (ref 30.0–36.0)
MCV: 95.8 fl (ref 78.0–100.0)
Monocytes Absolute: 0.5 10*3/uL (ref 0.1–1.0)
Monocytes Relative: 6.7 % (ref 3.0–12.0)
Neutro Abs: 4.7 10*3/uL (ref 1.4–7.7)
Neutrophils Relative %: 58 % (ref 43.0–77.0)
Platelets: 337 10*3/uL (ref 150.0–400.0)
RBC: 4.64 Mil/uL (ref 3.87–5.11)
RDW: 13 % (ref 11.5–15.5)
WBC: 8.1 10*3/uL (ref 4.0–10.5)

## 2022-02-13 LAB — BASIC METABOLIC PANEL
BUN: 9 mg/dL (ref 6–23)
CO2: 30 mEq/L (ref 19–32)
Calcium: 9.6 mg/dL (ref 8.4–10.5)
Chloride: 100 mEq/L (ref 96–112)
Creatinine, Ser: 0.88 mg/dL (ref 0.40–1.20)
GFR: 73.64 mL/min (ref >60.00)
Glucose, Bld: 160 mg/dL — ABNORMAL HIGH (ref 70–99)
Potassium: 3.6 mEq/L (ref 3.5–5.1)
Sodium: 137 mEq/L (ref 135–145)

## 2022-02-13 LAB — IBC + FERRITIN
Ferritin: 275.4 ng/mL (ref 10.0–291.0)
Iron: 73 ug/dL (ref 42–145)
Saturation Ratios: 22.8 % (ref 20.0–50.0)
TIBC: 320.6 ug/dL (ref 250.0–450.0)
Transferrin: 229 mg/dL (ref 212.0–360.0)

## 2022-02-13 LAB — MICROALBUMIN / CREATININE URINE RATIO
Creatinine,U: 110.5 mg/dL
Microalb Creat Ratio: 0.7 mg/g (ref 0.0–30.0)
Microalb, Ur: 0.8 mg/dL (ref 0.0–1.9)

## 2022-02-13 LAB — HEMOGLOBIN A1C: Hgb A1c MFr Bld: 7 % — ABNORMAL HIGH (ref 4.6–6.5)

## 2022-02-13 MED ORDER — EMGALITY 120 MG/ML ~~LOC~~ SOAJ: 240.0000 mg | Freq: Once | SUBCUTANEOUS | 0 refills | Status: AC

## 2022-02-13 MED ORDER — EMGALITY 120 MG/ML ~~LOC~~ SOAJ: 120.0000 mg | SUBCUTANEOUS | 1 refills | Status: DC

## 2022-02-13 MED ORDER — MOXIFLOXACIN HCL 400 MG PO TABS: 400.0000 mg | ORAL_TABLET | Freq: Every day | ORAL | 0 refills | Status: AC

## 2022-02-13 NOTE — Patient Instructions (Signed)
Type 2 Diabetes Mellitus, Diagnosis, Adult ?Type 2 diabetes (type 2 diabetes mellitus) is a long-term, or chronic, disease. In type 2 diabetes, one or both of these problems may be present: ?The pancreas does not make enough of a hormone called insulin. ?Cells in the body do not respond properly to the insulin that the body makes (insulin resistance). ?Normally, insulin allows blood sugar (glucose) to enter cells in the body. The cells use glucose for energy. Insulin resistance or lack of insulin causes excess glucose to build up in the blood instead of going into cells. This causes high blood glucose (hyperglycemia).  ?What are the causes? ?The exact cause of type 2 diabetes is not known. ?What increases the risk? ?The following factors may make you more likely to develop this condition: ?Having a family member with type 2 diabetes. ?Being overweight or obese. ?Being inactive (sedentary). ?Having been diagnosed with insulin resistance. ?Having a history of prediabetes, diabetes when you were pregnant (gestational diabetes), or polycystic ovary syndrome (PCOS). ?What are the signs or symptoms? ?In the early stage of this condition, you may not have symptoms. Symptoms develop slowly and may include: ?Increased thirst or hunger. ?Increased urination. ?Unexplained weight loss. ?Tiredness (fatigue) or weakness. ?Vision changes, such as blurry vision. ?Dark patches on the skin. ?How is this diagnosed? ?This condition is diagnosed based on your symptoms, your medical history, a physical exam, and your blood glucose level. Your blood glucose may be checked with one or more of the following blood tests: ?A fasting blood glucose (FBG) test. You will not be allowed to eat (you will fast) for 8 hours or longer before a blood sample is taken. ?A random blood glucose test. This test checks blood glucose at any time of day regardless of when you ate. ?An A1C (hemoglobin A1C) blood test. This test provides information about blood  glucose levels over the previous 2-3 months. ?An oral glucose tolerance test (OGTT). This test measures your blood glucose at two times: ?After fasting. This is your baseline blood glucose level. ?Two hours after drinking a beverage that contains glucose. ?You may be diagnosed with type 2 diabetes if: ?Your fasting blood glucose level is 126 mg/dL (7.0 mmol/L) or higher. ?Your random blood glucose level is 200 mg/dL (11.1 mmol/L) or higher. ?Your A1C level is 6.5% or higher. ?Your oral glucose tolerance test result is higher than 200 mg/dL (11.1 mmol/L). ?These blood tests may be repeated to confirm your diagnosis. ?How is this treated? ?Your treatment may be managed by a specialist called an endocrinologist. Type 2 diabetes may be treated by following instructions from your health care provider about: ?Making dietary and lifestyle changes. These may include: ?Following a personalized nutrition plan that is developed by a registered dietitian. ?Exercising regularly. ?Finding ways to manage stress. ?Checking your blood glucose level as often as told. ?Taking diabetes medicines or insulin daily. This helps to keep your blood glucose levels in the healthy range. ?Taking medicines to help prevent complications from diabetes. Medicines may include: ?Aspirin. ?Medicine to lower cholesterol. ?Medicine to control blood pressure. ?Your health care provider will set treatment goals for you. Your goals will be based on your age, other medical conditions you have, and how you respond to diabetes treatment. Generally, the goal of treatment is to maintain the following blood glucose levels: ?Before meals: 80-130 mg/dL (4.4-7.2 mmol/L). ?After meals: below 180 mg/dL (10 mmol/L). ?A1C level: less than 7%. ?Follow these instructions at home: ?Questions to ask your health care provider ?  Consider asking the following questions: ?Should I meet with a certified diabetes care and education specialist? ?What diabetes medicines do I need,  and when should I take them? ?What equipment will I need to manage my diabetes at home? ?How often do I need to check my blood glucose? ?Where can I find a support group for people with diabetes? ?What number can I call if I have questions? ?When is my next appointment? ?General instructions ?Take over-the-counter and prescription medicines only as told by your health care provider. ?Keep all follow-up visits. This is important. ?Where to find more information ?For help and guidance and for more information about diabetes, please visit: ?American Diabetes Association (ADA): www.diabetes.org ?American Association of Diabetes Care and Education Specialists (ADCES): www.diabeteseducator.org ?International Diabetes Federation (IDF): www.idf.org ?Contact a health care provider if: ?Your blood glucose is at or above 240 mg/dL (13.3 mmol/L) for 2 days in a row. ?You have been sick or have had a fever for 2 days or longer, and you are not getting better. ?You have any of the following problems for more than 6 hours: ?You cannot eat or drink. ?You have nausea and vomiting. ?You have diarrhea. ?Get help right away if: ?You have severe hypoglycemia. This means your blood glucose is lower than 54 mg/dL (3.0 mmol/L). ?You become confused or you have trouble thinking clearly. ?You have difficulty breathing. ?You have moderate or large ketone levels in your urine. ?These symptoms may represent a serious problem that is an emergency. Do not wait to see if the symptoms will go away. Get medical help right away. Call your local emergency services (911 in the [place] Do not drive yourself to the hospital. ?Summary ?Type 2 diabetes mellitus is a long-term, or chronic, disease. In type 2 diabetes, the pancreas does not make enough of a hormone called insulin, or cells in the body do not respond properly to insulin that the body makes. ?This condition is treated by making dietary and lifestyle changes and taking diabetes medicines or  insulin. ?Your health care provider will set treatment goals for you. Your goals will be based on your age, other medical conditions you have, and how you respond to diabetes treatment. ?Keep all follow-up visits. This is important. ?This information is not intended to replace advice given to you by your health care provider. Make sure you discuss any questions you have with your health care provider. ?Document Revised: 03/06/2021 Document Reviewed: 03/06/2021 ?Elsevier Patient Education ? 2022 Elsevier Inc. ? ?

## 2022-02-13 NOTE — Progress Notes (Signed)
Subjective:  Patient ID: Ashley [name], female    DOB: 1966/04/07  Age: 56 y.o. MRN: [id number]  CC: Hypertension, Sinusitis, and Anemia  This visit occurred during the SARS-CoV-2 public health emergency.  Safety protocols were in place, including screening questions prior to the visit, additional usage of staff PPE, and extensive cleaning of exam room while observing appropriate contact time as indicated for disinfecting solutions.    HPI Ashley [name] presents for f/up -   She was recently treated by someone else for sinus infection.  She took a course of doxycycline but continues to complain of thick brown nasal phlegm.  She denies cough, wheezing, fever, chills, or night sweats.  She continues to complain of headaches.  Ashley [name] insurance company has not approved Aimovig.  Outpatient Medications Prior to Visit  Medication Sig Dispense Refill   albuterol (VENTOLIN HFA) 108 (90 Base) MCG/ACT inhaler INHALE 2 PUFFS INTO THE LUNGS EVERY 6 HOURS AS NEEDED FOR WHEEZING OR SHORTNESS OF BREATH 6.7 g 3   AQUALANCE LANCETS 30G MISC   98   Budeson-Glycopyrrol-Formoterol (BREZTRI AEROSPHERE) 160-9-4.8 MCG/ACT AERO Inhale 2 puffs into the lungs in the morning and at bedtime. 32.1 g 1   buPROPion (WELLBUTRIN XL) 150 MG 24 hr tablet Take 1 tablet (150 mg total) by mouth daily. 90 tablet 0   cholecalciferol (VITAMIN D3) 25 MCG (1000 UT) tablet Take 2,000 Units by mouth daily.     Continuous Blood Gluc Receiver (FREESTYLE LIBRE 2 READER) DEVI 1 Act by Does not apply route daily. 2 each 5   Continuous Blood Gluc Sensor (FREESTYLE LIBRE 2 SENSOR) MISC USE TO CHECK BLOOD SUGER DAILY 2 each 5   doxycycline (VIBRA-TABS) 100 MG tablet Take 1 tablet (100 mg total) by mouth 2 (two) times daily. 20 tablet 0   Erenumab-aooe (AIMOVIG) 140 MG/ML SOAJ Inject 140 mg into the skin every 30 (thirty) days. 12 mL 1   estradiol (CLIMARA - DOSED IN MG/24 HR) 0.0375 mg/24hr patch APPLY 1 PATCH(0.038 MG) TOPICALLY TO THE SKIN 1 TIME A  WEEK 4 patch 0   FEROSUL 325 (65 Fe) MG tablet TAKE 1 TABLET(325 MG) BY MOUTH TWICE DAILY WITH A MEAL 180 tablet 1   fludrocortisone (FLORINEF) 0.1 MG tablet TAKE 1 TABLET(0.1 MG) BY MOUTH DAILY 90 tablet 1   fluticasone (FLONASE) 50 MCG/ACT nasal spray Place 2 sprays into both nostrils daily. 16 g 6   insulin aspart (FIASP) 100 UNIT/ML FlexTouch Pen Inject 10 Units into the skin 3 (three) times daily with meals. 27 mL 0   insulin detemir (LEVEMIR) 100 UNIT/ML FlexPen Inject 30 Units into the skin daily. 27 mL 0   Insulin Pen Needle 32G X 6 MM MISC Inject 1 Act into the skin 4 (four) times daily. Use daily as directed 300 each 1   irbesartan (AVAPRO) 300 MG tablet Take 1 tablet (300 mg total) by mouth daily. 90 tablet 0   Lancet Devices (ADJUSTABLE LANCING DEVICE) MISC Up to tid testing  98   metoprolol tartrate (LOPRESSOR) 50 MG tablet Take 1 tablet (50 mg total) by mouth 2 (two) times daily. 180 tablet 3   pantoprazole (PROTONIX) 40 MG tablet TAKE 1 TABLET(40 MG) BY MOUTH DAILY 90 tablet 1   phenazopyridine (PYRIDIUM) 100 MG tablet Take 1 tablet (100 mg total) by mouth 3 (three) times daily as needed for pain. 10 tablet 0   potassium chloride SA (KLOR-CON) 20 MEQ tablet Take 1 tablet (20 mEq total) by  mouth 3 (three) times daily. 270 tablet 0   promethazine (PHENERGAN) 12.5 MG tablet Take 1 tablet (12.5 mg total) by mouth every 6 (six) hours as needed for nausea or vomiting. 30 tablet 3   rosuvastatin (CRESTOR) 20 MG tablet TAKE 1 TABLET BY MOUTH EVERY DAY 90 tablet 1   thiamine (VITAMIN B-1) 50 MG tablet Take 1 tablet (50 mg total) by mouth daily. 90 tablet 1   traZODone (DESYREL) 150 MG tablet TAKE 1 TABLET(150 MG) BY MOUTH AT BEDTIME 90 tablet 1   venlafaxine XR (EFFEXOR-XR) 150 MG 24 hr capsule TAKE 1 CAPSULE(150 MG) BY MOUTH DAILY WITH BREAKFAST (Patient taking differently: Take 150 mg by mouth at bedtime.) 90 capsule 3   No facility-administered medications prior to visit.     ROS Review of Systems  Constitutional: Negative.  Negative for chills, fatigue and fever.  HENT:  Positive for postnasal drip, rhinorrhea, sinus pressure and sinus pain. Negative for congestion, facial swelling, nosebleeds, sore throat and trouble swallowing.   Eyes: Negative.   Respiratory:  Negative for cough, chest tightness, shortness of breath and wheezing.   Cardiovascular:  Negative for chest pain, palpitations and leg swelling.  Gastrointestinal:  Negative for abdominal pain, constipation, diarrhea, nausea and vomiting.  Endocrine: Negative.   Genitourinary: Negative.  Negative for difficulty urinating.  Musculoskeletal: Negative.   Skin: Negative.   Neurological:  Positive for headaches. Negative for dizziness and weakness.  Hematological:  Negative for adenopathy. Does not bruise/bleed easily.  Psychiatric/Behavioral: Negative.     Objective:  BP 132/78 (BP Location: Left Arm, Patient Position: Sitting, Cuff Size: Large)    Pulse 79    Temp 98.1 F (36.7 C) (Oral)    Resp 16    Ht 5\' 2"  (1.575 m)    Wt 152 lb (68.9 kg)    LMP 02/12/2014    SpO2 95%    BMI 27.80 kg/m   BP Readings from Last 3 Encounters:  02/13/22 132/78  01/25/22 130/72  01/16/22 140/81    Wt Readings from Last 3 Encounters:  02/13/22 152 lb (68.9 kg)  01/25/22 156 lb 12.8 oz (71.1 kg)  01/16/22 157 lb (71.2 kg)    Physical Exam Vitals reviewed.  Constitutional:      General: She is not in acute distress.    Appearance: She is not toxic-appearing or diaphoretic.  HENT:     Nose: Nose normal.     Mouth/Throat:     Mouth: Mucous membranes are moist.  Eyes:     General: No scleral icterus.    Conjunctiva/sclera: Conjunctivae normal.  Cardiovascular:     Rate and Rhythm: Normal rate and regular rhythm.     Heart sounds: No murmur heard. Pulmonary:     Effort: Pulmonary effort is normal.     Breath sounds: No stridor. No wheezing, rhonchi or rales.  Abdominal:     General: Abdomen is  flat.     Palpations: There is no mass.     Tenderness: There is no abdominal tenderness. There is no guarding.     Hernia: No hernia is present.  Musculoskeletal:     Cervical back: Neck supple.     Right lower leg: No edema.  Lymphadenopathy:     Cervical: No cervical adenopathy.  Skin:    General: Skin is warm and dry.  Neurological:     General: No focal deficit present.     Mental Status: She is alert. Mental status is at baseline.  Psychiatric:  Mood and Affect: Mood normal.    Lab Results  Component Value Date   WBC 8.1 02/13/2022   HGB 15.1 (H) 02/13/2022   HCT 44.5 02/13/2022   PLT 337.0 02/13/2022   GLUCOSE 160 (H) 02/13/2022   CHOL 97 03/21/2021   TRIG 78 03/21/2021   HDL 32 (L) 03/21/2021   LDLDIRECT 152.1 01/19/2013   LDLCALC 49 03/21/2021   ALT 18 05/09/2021   AST 17 05/09/2021   NA 137 02/13/2022   K 3.6 02/13/2022   CL 100 02/13/2022   CREATININE 0.88 02/13/2022   BUN 9 02/13/2022   CO2 30 02/13/2022   TSH 1.670 10/31/2020   INR 1.0 05/09/2021   HGBA1C 7.0 (H) 02/13/2022   MICROALBUR 0.8 02/13/2022    VAS [place] ABI WITH/WO TBI  Result Date: 01/16/2022  LOWER EXTREMITY DOPPLER STUDY Patient Name:  Ashley [name]  Date of Exam:   01/16/2022 Medical Rec #: [id number]    Accession #:    [id number] Date of Birth: 02-02-1966    Patient Gender: F Patient Age:   52 years Exam Location:  [facility] Procedure:      VAS [place] ABI WITH/WO TBI Referring Phys: Jamelle Haring --------------------------------------------------------------------------------  Indications: Claudication. High Risk Factors: Hypertension, hyperlipidemia, Diabetes, current smoker.  Comparison Study: 01/03/2018 Performing Technologist: Lita Mains  Examination Guidelines: A complete evaluation includes at minimum, Doppler waveform signals and systolic blood pressure reading at the level of bilateral brachial, anterior tibial, and posterior tibial arteries, when vessel segments are  accessible. Bilateral testing is considered an integral part of a complete examination. Photoelectric Plethysmograph (PPG) waveforms and toe systolic pressure readings are included as required and additional duplex testing as needed. Limited examinations for reoccurring indications may be performed as noted.  ABI Findings: +---------+------------------+-----+---------+--------+  Right     Rt Pressure (mmHg) Index Waveform  Comment   +---------+------------------+-----+---------+--------+  Brachial  137                      triphasic           +---------+------------------+-----+---------+--------+  PTA       176                1.22  biphasic            +---------+------------------+-----+---------+--------+  DP        161                1.12  triphasic           +---------+------------------+-----+---------+--------+  Great Toe 187                1.30  Normal              +---------+------------------+-----+---------+--------+ +---------+------------------+-----+---------+-------+  Left      Lt Pressure (mmHg) Index Waveform  Comment  +---------+------------------+-----+---------+-------+  Brachial  144                      triphasic          +---------+------------------+-----+---------+-------+  PTA       171                1.19  triphasic          +---------+------------------+-----+---------+-------+  DP        178                1.24  biphasic           +---------+------------------+-----+---------+-------+  Great Toe 169                1.17  Normal             +---------+------------------+-----+---------+-------+ +-------+-----------+-----------+------------+------------+  ABI/TBI Today's ABI Today's TBI Previous ABI Previous TBI  +-------+-----------+-----------+------------+------------+  Right   1.22        1.3         1.17         0.98          +-------+-----------+-----------+------------+------------+  Left    1.24        1.17        1.13         1.07           +-------+-----------+-----------+------------+------------+  Bilateral ABIs and TBIs appear essentially unchanged.  Summary: Right: Resting right ankle-brachial index is within normal range. No evidence of significant right lower extremity arterial disease. The right toe-brachial index is normal. Left: Resting left ankle-brachial index is within normal range. No evidence of significant left lower extremity arterial disease. The left toe-brachial index is normal.  *See table(s) above for measurements and observations.  Electronically signed by Jamelle Haring on 01/16/2022 at 10:43:30 AM.    Final     Assessment & Plan:   Ashley [name] was seen today for hypertension, sinusitis and anemia.  Diagnoses and all orders for this visit:  Essential hypertension-Ashley [name] blood pressure is adequately well controlled. -     Basic metabolic panel; Future -     Urinalysis, Routine w reflex microscopic; Future -     Urinalysis, Routine w reflex microscopic -     Basic metabolic panel  Insulin-requiring or dependent type II diabetes mellitus (HCC)-Ashley [name] blood sugar is adequately well controlled. -     Microalbumin / creatinine urine ratio; Future -     Hemoglobin A1c; Future -     Microalbumin / creatinine urine ratio -     Hemoglobin A1c  Dietary iron deficiency- Ashley [name] H&H are normal now. -     IBC + Ferritin; Future -     CBC with Differential/Platelet; Future -     CBC with Differential/Platelet -     IBC + Ferritin  Hypokalemia -     Basic metabolic panel; Future -     Basic metabolic panel  Subacute maxillary sinusitis -     moxifloxacin (AVELOX) 400 MG tablet; Take 1 tablet (400 mg total) by mouth daily for 7 days.  Intractable migraine with aura without status migrainosus -     EMGALITY 120 MG/ML SOAJ; Inject 240 mg into the skin once for 1 dose. -     Galcanezumab-gnlm (EMGALITY) 120 MG/ML SOAJ; Inject 120 mg into the skin every 30 (thirty) days.   I am having Ashley [name] start on moxifloxacin, Emgality,  and Ashley [name]. I am also having Ashley [name] maintain Ashley [name] Adjustable Lancing Device, AquaLance Lancets 30G, cholecalciferol, venlafaxine XR, thiamine, promethazine, pantoprazole, albuterol, FreeStyle Libre 2 Sensor, potassium chloride SA, FeroSul, fludrocortisone, metoprolol tartrate, insulin aspart, Insulin Pen Needle, traZODone, rosuvastatin, FreeStyle Libre 2 Reader, buPROPion, irbesartan, Aimovig, insulin detemir, Breztri Aerosphere, phenazopyridine, doxycycline, fluticasone, and estradiol.  Meds ordered this encounter  Medications   moxifloxacin (AVELOX) 400 MG tablet    Sig: Take 1 tablet (400 mg total) by mouth daily for 7 days.    Dispense:  7 tablet    Refill:  0   EMGALITY 120 MG/ML SOAJ    Sig: Inject 240 mg into the skin once for  1 dose.    Dispense:  2.24 mL    Refill:  0   Galcanezumab-gnlm (EMGALITY) 120 MG/ML SOAJ    Sig: Inject 120 mg into the skin every 30 (thirty) days.    Dispense:  3.36 mL    Refill:  1     Follow-up: Return in about 6 months (around 08/13/2022).  Scarlette Calico, MD

## 2022-02-14 LAB — URINALYSIS, ROUTINE W REFLEX MICROSCOPIC
Bilirubin Urine: NEGATIVE
Hgb urine dipstick: NEGATIVE
Ketones, ur: NEGATIVE
Leukocytes,Ua: NEGATIVE
Nitrite: NEGATIVE
Specific Gravity, Urine: 1.015 (ref 1.000–1.030)
Total Protein, Urine: NEGATIVE
Urine Glucose: NEGATIVE
Urobilinogen, UA: 0.2 (ref 0.0–1.0)
pH: 7 (ref 5.0–8.0)

## 2022-02-15 ENCOUNTER — Encounter: Payer: Self-pay | Admitting: Internal Medicine

## 2022-03-05 ENCOUNTER — Other Ambulatory Visit: Payer: Self-pay | Admitting: Internal Medicine

## 2022-03-05 DIAGNOSIS — Z794 Long term (current) use of insulin: Secondary | ICD-10-CM

## 2022-03-05 DIAGNOSIS — E118 Type 2 diabetes mellitus with unspecified complications: Secondary | ICD-10-CM

## 2022-03-05 DIAGNOSIS — E119 Type 2 diabetes mellitus without complications: Secondary | ICD-10-CM

## 2022-03-05 MED ORDER — INSULIN DETEMIR 100 UNIT/ML FLEXPEN: 20.0000 [IU] | PEN_INJECTOR | Freq: Every day | SUBCUTANEOUS | 1 refills | Status: DC

## 2022-03-16 ENCOUNTER — Other Ambulatory Visit: Payer: Self-pay | Admitting: Internal Medicine

## 2022-03-16 DIAGNOSIS — N951 Menopausal and female climacteric states: Secondary | ICD-10-CM

## 2022-03-20 ENCOUNTER — Other Ambulatory Visit: Payer: Self-pay | Admitting: Internal Medicine

## 2022-03-20 DIAGNOSIS — K2100 Gastro-esophageal reflux disease with esophagitis, without bleeding: Secondary | ICD-10-CM

## 2022-03-20 DIAGNOSIS — E611 Iron deficiency: Secondary | ICD-10-CM

## 2022-03-20 DIAGNOSIS — I959 Hypotension, unspecified: Secondary | ICD-10-CM

## 2022-04-09 ENCOUNTER — Other Ambulatory Visit: Payer: Self-pay | Admitting: Internal Medicine

## 2022-04-09 DIAGNOSIS — I10 Essential (primary) hypertension: Secondary | ICD-10-CM

## 2022-04-09 DIAGNOSIS — F331 Major depressive disorder, recurrent, moderate: Secondary | ICD-10-CM

## 2022-06-18 ENCOUNTER — Other Ambulatory Visit: Payer: Self-pay | Admitting: Internal Medicine

## 2022-06-18 DIAGNOSIS — F411 Generalized anxiety disorder: Secondary | ICD-10-CM

## 2022-06-18 DIAGNOSIS — E785 Hyperlipidemia, unspecified: Secondary | ICD-10-CM

## 2022-06-18 DIAGNOSIS — E118 Type 2 diabetes mellitus with unspecified complications: Secondary | ICD-10-CM

## 2022-06-18 DIAGNOSIS — I739 Peripheral vascular disease, unspecified: Secondary | ICD-10-CM

## 2022-07-17 ENCOUNTER — Other Ambulatory Visit: Payer: Self-pay | Admitting: Internal Medicine

## 2022-07-17 DIAGNOSIS — F331 Major depressive disorder, recurrent, moderate: Secondary | ICD-10-CM

## 2022-07-17 DIAGNOSIS — I10 Essential (primary) hypertension: Secondary | ICD-10-CM

## 2022-07-17 DIAGNOSIS — J411 Mucopurulent chronic bronchitis: Secondary | ICD-10-CM

## 2022-08-03 ENCOUNTER — Ambulatory Visit: Payer: 59 | Admitting: Family Medicine

## 2022-08-23 ENCOUNTER — Ambulatory Visit (INDEPENDENT_AMBULATORY_CARE_PROVIDER_SITE_OTHER): Payer: 59

## 2022-08-23 ENCOUNTER — Encounter: Payer: Self-pay | Admitting: Family Medicine

## 2022-08-23 ENCOUNTER — Ambulatory Visit (INDEPENDENT_AMBULATORY_CARE_PROVIDER_SITE_OTHER): Payer: 59 | Admitting: Family Medicine

## 2022-08-23 VITALS — BP 150/62 | HR 95 | Temp 97.9°F | Ht 62.0 in | Wt 143.0 lb

## 2022-08-23 DIAGNOSIS — M5416 Radiculopathy, lumbar region: Secondary | ICD-10-CM

## 2022-08-23 DIAGNOSIS — M545 Low back pain, unspecified: Secondary | ICD-10-CM

## 2022-08-23 LAB — CBC WITH DIFFERENTIAL/PLATELET
Basophils Absolute: 0.1 10*3/uL (ref 0.0–0.1)
Basophils Relative: 0.6 % (ref 0.0–3.0)
Eosinophils Absolute: 0.2 10*3/uL (ref 0.0–0.7)
Eosinophils Relative: 1.6 % (ref 0.0–5.0)
HCT: 44.2 % (ref 36.0–46.0)
Hemoglobin: 15.1 g/dL — ABNORMAL HIGH (ref 12.0–15.0)
Lymphocytes Relative: 31.5 % (ref 12.0–46.0)
Lymphs Abs: 3.6 10*3/uL (ref 0.7–4.0)
MCHC: 34.2 g/dL (ref 30.0–36.0)
MCV: 93.2 fl (ref 78.0–100.0)
Monocytes Absolute: 0.8 10*3/uL (ref 0.1–1.0)
Monocytes Relative: 6.8 % (ref 3.0–12.0)
Neutro Abs: 6.8 10*3/uL (ref 1.4–7.7)
Neutrophils Relative %: 59.5 % (ref 43.0–77.0)
Platelets: 370 10*3/uL (ref 150.0–400.0)
RBC: 4.75 Mil/uL (ref 3.87–5.11)
RDW: 13.7 % (ref 11.5–15.5)
WBC: 11.5 10*3/uL — ABNORMAL HIGH (ref 4.0–10.5)

## 2022-08-23 MED ORDER — PREDNISONE 20 MG PO TABS: 40.0000 mg | ORAL_TABLET | Freq: Every day | ORAL | 0 refills | Status: DC

## 2022-08-23 MED ORDER — LIDOCAINE 5 % EX PTCH: 1.0000 | MEDICATED_PATCH | CUTANEOUS | 0 refills | Status: DC

## 2022-08-23 NOTE — Progress Notes (Signed)
Subjective:     Patient ID: [name], female    DOB: 02/01/1966, 56 y.o.   MRN: [id number]  Chief Complaint  Patient presents with   Back Pain    Constant lower back pain since July 24th, is not sure what caused it. States even when she bends her head it worsens the pain. Went to UC and they told her no UTI. Average pain is 6 but gets worse at times depending on what she is doing.     HPI Patient is in today for low back pain, predominantly on right. Pain started on 07/16/2022. No known injury. Occasionally pain radiates down her right leg. No numbness or weakness.  States she can sit, stand, walk and lay down.   Neck flexion makes her pain worse. Coughing and sudden movements make her pain worse.   Denies fever, chills, dizziness, chest pain, palpitations, shortness of breath, abdominal pain, N/V/D, urinary symptoms, LE edema.   Denies hx of back pain or injury.   Evaluated in UC on 08/01/2022 and negative UA and urine culture. Toradol injection given which did not help per patient.   States she has tried NSAIDs OTC, Tylenol, and percocet from family member without any relief.  Using heating pad.   Health Maintenance Due  Topic Date Due   PAP SMEAR-Modifier  02/13/2022   FOOT EXAM  05/04/2022   OPHTHALMOLOGY EXAM  05/31/2022   HEMOGLOBIN A1C  08/13/2022    Past Medical History:  Diagnosis Date   Anxiety    Atypical ductal hyperplasia of left breast    Bronchitis    uses inhaler if needed for bronchitis, lasted used -2014   Cataract    bilateral   Chronic headaches    migraines - in past, uses Phenergan for nausea    COPD (chronic obstructive pulmonary disease) ([place])    Deficiency anemia 12/30/2017   Depression    Diabetes mellitus without complication ([place])    taken off Metformin since 04/2014, HgbA1C - normal, will follow up with PCP- Dr. Deborra Medina, 07/2014   GERD (gastroesophageal reflux disease)    H/O exercise stress test 2011   done at [facility]- told that it  was WNL, done due to pt. having panic attacks    History of blood transfusion 1966/02/03   at birth in [name], [place], unsure number of units   PONV (postoperative nausea and vomiting)    Poor dentition    very poor oral health    SVD (spontaneous vaginal delivery)    x 3   Tobacco abuse    Wears glasses     Past Surgical History:  Procedure Laterality Date   ABDOMINAL HYSTERECTOMY     APPENDECTOMY  1988   BREAST LUMPECTOMY WITH RADIOACTIVE SEED LOCALIZATION Left 03/17/2020   Procedure: LEFT BREAST LUMPECTOMY WITH RADIOACTIVE SEED LOCALIZATION;  Surgeon: Coralie Keens, MD;  Location: [facility];  Service: General;  Laterality: Left;  LMA   CARDIAC CATHETERIZATION N/A 08/17/2016   Procedure: Left Heart Cath and Coronary Angiography;  Surgeon: Burnell Blanks, MD;  Location: [facility];  Service: Cardiovascular;  Laterality: N/A;   CHOLECYSTECTOMY N/A 06/07/2014   Procedure: LAPAROSCOPIC CHOLECYSTECTOMY;  Surgeon: Ralene Ok, MD;  Location: [facility];  Service: General;  Laterality: N/A;   CHOLECYSTECTOMY  June 07 2014   COLONOSCOPY  02/16/2014   normal    ESOPHAGOGASTRODUODENOSCOPY  12/28/2013   KNEE ARTHROSCOPY  1995   left   LAPAROSCOPIC ASSISTED VAGINAL HYSTERECTOMY N/A 04/15/2014  Procedure: LAPAROSCOPIC ASSISTED VAGINAL HYSTERECTOMY;  Surgeon: Marylynn Pearson, MD;  Location: [facility];  Service: Gynecology;  Laterality: N/A;   LAPAROSCOPIC BILATERAL SALPINGO OOPHERECTOMY Bilateral 04/15/2014   Procedure: LAPAROSCOPIC BILATERAL SALPINGO OOPHORECTOMY;  Surgeon: Marylynn Pearson, MD;  Location: [facility];  Service: Gynecology;  Laterality: Bilateral;   TUBAL LIGATION      Family History  Problem Relation Age of Onset   Diabetes Mother    Hyperlipidemia Mother    Hypertension Mother    Anxiety disorder Mother    Depression Mother    Drug abuse Mother    Stroke Mother        x3   Prostate cancer Father    Alcohol abuse Father    Lung cancer Maternal Grandfather         smoked   Emphysema Maternal Grandfather        smoked   COPD Maternal Grandmother        never smoked    Dementia Maternal Grandmother    Thyroid cancer Paternal Aunt    Colon cancer Neg Hx    Rectal cancer Neg Hx    Stomach cancer Neg Hx    Migraines Neg Hx     Social History   Socioeconomic History   Marital status: Married    Spouse name: Alvester Chou   Number of children: 6   Years of education: 12   Highest education level: Not on file  Occupational History   Occupation:      Comment: helps run a friends business  Tobacco Use   Smoking status: Every Day    Packs/day: 2.00    Years: 35.00    Total pack years: 70.00    Types: Cigarettes   Smokeless tobacco: Never  Vaping Use   Vaping Use: Never used  Substance and Sexual Activity   Alcohol use: No    Alcohol/week: 0.0 standard drinks of alcohol   Drug use: No   Sexual activity: Yes    Partners: Male    Birth control/protection: Surgical  Other Topics Concern   Not on file  Social History Narrative   5 living children, one child is a crack cocaine addict who often breaks into their house to steal money. He is currently in jail. One child died of cerebral palsy.   Caffeine use: Dr Malachi Bonds (3 per day)   2 cups coffee per day   Online student, currently not working   Social Determinants of Radio broadcast assistant Strain: Not on file  Food Insecurity: Not on file  Transportation Needs: Not on file  Physical Activity: Not on file  Stress: Not on file  Social Connections: Not on file  Intimate Partner Violence: Not on file    Outpatient Medications Prior to Visit  Medication Sig Dispense Refill   albuterol (VENTOLIN HFA) 108 (90 Base) MCG/ACT inhaler INHALE 2 PUFFS INTO THE LUNGS EVERY 6 HOURS AS NEEDED FOR WHEEZING OR SHORTNESS OF BREATH 6.7 g 3   AQUALANCE LANCETS 30G MISC   98   BREZTRI AEROSPHERE 160-9-4.8 MCG/ACT AERO INHALE 2 PUFFS INTO THE LUNGS IN THE MORNING AND AT BEDTIME 32.1 g 1   buPROPion  (WELLBUTRIN XL) 150 MG 24 hr tablet TAKE 1 TABLET(150 MG) BY MOUTH DAILY 90 tablet 0   cholecalciferol (VITAMIN D3) 25 MCG (1000 UT) tablet Take 2,000 Units by mouth daily.     Continuous Blood Gluc Receiver (FREESTYLE LIBRE 2 READER) DEVI 1 Act by Does not apply route daily. 2 each  5   Continuous Blood Gluc Sensor (FREESTYLE LIBRE 2 SENSOR) MISC USE TO CHECK BLOOD SUGER DAILY 2 each 5   Erenumab-aooe (AIMOVIG) 140 MG/ML SOAJ Inject 140 mg into the skin every 30 (thirty) days. 12 mL 1   estradiol (CLIMARA - DOSED IN MG/24 HR) 0.0375 mg/24hr patch APPLY 1 PATCH TOPICALLY TO THE SKIN ONCE WEEKLY 4 patch 0   FEROSUL 325 (65 Fe) MG tablet TAKE 1 TABLET(325 MG) BY MOUTH TWICE DAILY WITH A MEAL 180 tablet 1   fludrocortisone (FLORINEF) 0.1 MG tablet TAKE 1 TABLET(0.1 MG) BY MOUTH DAILY 90 tablet 1   fluticasone (FLONASE) 50 MCG/ACT nasal spray Place 2 sprays into both nostrils daily. 16 g 6   Galcanezumab-gnlm (EMGALITY) 120 MG/ML SOAJ Inject 120 mg into the skin every 30 (thirty) days. 3.36 mL 1   insulin aspart (FIASP) 100 UNIT/ML FlexTouch Pen Inject 10 Units into the skin 3 (three) times daily with meals. 27 mL 0   insulin detemir (LEVEMIR) 100 UNIT/ML FlexPen Inject 20 Units into the skin daily. 18 mL 1   Insulin Pen Needle 32G X 6 MM MISC Inject 1 Act into the skin 4 (four) times daily. Use daily as directed 300 each 1   irbesartan (AVAPRO) 300 MG tablet TAKE 1 TABLET(300 MG) BY MOUTH DAILY 90 tablet 0   Lancet Devices (ADJUSTABLE LANCING DEVICE) MISC Up to tid testing  98   metoprolol tartrate (LOPRESSOR) 50 MG tablet Take 1 tablet (50 mg total) by mouth 2 (two) times daily. 180 tablet 3   pantoprazole (PROTONIX) 40 MG tablet TAKE 1 TABLET(40 MG) BY MOUTH DAILY 90 tablet 1   phenazopyridine (PYRIDIUM) 100 MG tablet Take 1 tablet (100 mg total) by mouth 3 (three) times daily as needed for pain. 10 tablet 0   potassium chloride SA (KLOR-CON) 20 MEQ tablet Take 1 tablet (20 mEq total) by mouth 3  (three) times daily. 270 tablet 0   promethazine (PHENERGAN) 12.5 MG tablet Take 1 tablet (12.5 mg total) by mouth every 6 (six) hours as needed for nausea or vomiting. 30 tablet 3   rosuvastatin (CRESTOR) 20 MG tablet TAKE 1 TABLET BY MOUTH EVERY DAY 90 tablet 1   thiamine (VITAMIN B-1) 50 MG tablet Take 1 tablet (50 mg total) by mouth daily. 90 tablet 1   traZODone (DESYREL) 150 MG tablet TAKE 1 TABLET(150 MG) BY MOUTH AT BEDTIME 90 tablet 1   venlafaxine XR (EFFEXOR-XR) 150 MG 24 hr capsule TAKE 1 CAPSULE(150 MG) BY MOUTH DAILY WITH BREAKFAST (Patient taking differently: Take 150 mg by mouth at bedtime.) 90 capsule 3   No facility-administered medications prior to visit.    Allergies  Allergen Reactions   Penicillins Anaphylaxis and Swelling    Throat swells Has patient had a PCN reaction causing immediate rash, facial/tongue/throat swelling, SOB or lightheadedness with hypotension: Yes Has patient had a PCN reaction causing severe rash involving mucus membranes or skin necrosis: No Has patient had a PCN reaction that required hospitalization: No Has patient had a PCN reaction occurring within the last 10 years: No If all of the above answers are "NO", then may proceed with Cephalosporin use.    Topamax [Topiramate] Other (See Comments)    Hands and feet  go numb   Metformin And Related Other (See Comments)    GI side effects     ROS     Objective:    Physical Exam Constitutional:      General: She is not  in acute distress.    Appearance: She is not ill-appearing.  HENT:     Mouth/Throat:     Mouth: Mucous membranes are moist.  Eyes:     Conjunctiva/sclera: Conjunctivae normal.     Pupils: Pupils are equal, round, and reactive to light.  Cardiovascular:     Rate and Rhythm: Normal rate and regular rhythm.  Pulmonary:     Effort: Pulmonary effort is normal.     Breath sounds: Normal breath sounds.  Musculoskeletal:     Cervical back: Normal, normal range of motion  and neck supple.     Thoracic back: Normal.     Lumbar back: Tenderness present. No signs of trauma. Decreased range of motion. Negative right straight leg raise test and negative left straight leg raise test.       Back:     Right lower leg: No edema.     Left lower leg: No edema.     Comments: Midline lumbar spine TTP. Bilateral LEs are neurovascularly intact.   Skin:    General: Skin is warm and dry.     Findings: No rash.  Neurological:     General: No focal deficit present.     Mental Status: She is alert and oriented to person, place, and time.     Cranial Nerves: No cranial nerve deficit.     Sensory: No sensory deficit.     Motor: No weakness.     Coordination: Coordination normal.     Gait: Gait normal.     Deep Tendon Reflexes: Reflexes normal.  Psychiatric:        Mood and Affect: Mood normal.        Behavior: Behavior normal.        Thought Content: Thought content normal.     BP (!) 150/62 (BP Location: Left Arm, Patient Position: Sitting, Cuff Size: Large)   Pulse 95   Temp 97.9 F (36.6 C) (Temporal)   Ht '5\' 2"'$  (1.575 m)   Wt 143 lb (64.9 kg)   LMP 02/12/2014   SpO2 98%   BMI 26.16 kg/m  Wt Readings from Last 3 Encounters:  08/23/22 143 lb (64.9 kg)  02/13/22 152 lb (68.9 kg)  01/25/22 156 lb 12.8 oz (71.1 kg)       Assessment & Plan:   Problem List Items Addressed This Visit   None Visit Diagnoses     Severe low back pain    -  Primary   Relevant Medications   predniSONE (DELTASONE) 20 MG tablet   lidocaine (LIDODERM) 5 %   Other Relevant Orders   CBC with Differential/Platelet (Completed)   DG Lumbar Spine Complete   Ambulatory referral to Orthopedic Surgery   Lumbar radiculopathy       Relevant Medications   predniSONE (DELTASONE) 20 MG tablet   lidocaine (LIDODERM) 5 %   Other Relevant Orders   CBC with Differential/Platelet (Completed)   DG Lumbar Spine Complete   Ambulatory referral to Orthopedic Surgery      Reviewed notes and  results from urgent care visit for back pain.  No red flag symptoms. lumbar spine x-ray ordered.  CBC ordered to rule out an infectious process.  Oral prednisone and Lidoderm patches prescribed.   Recommend against taking other people's medications.  Urgent referral to Ortho care for further evaluation and treatment.    Visit time 24 minutes in face to face communication with patient and coordination of care, additional 7 minutes spent in record review,  coordination or care, ordering tests, communicating/referring to other healthcare professionals, documenting in medical records all on the same day of the visit for total time 31 minutes spent on the visit.    I am having Tiffanye B. Chao start on predniSONE and lidocaine. I am also having her maintain her Adjustable Lancing Device, AquaLance Lancets 30G, cholecalciferol, venlafaxine XR, thiamine, promethazine, albuterol, FreeStyle Libre 2 Sensor, potassium chloride SA, metoprolol tartrate, insulin aspart, Insulin Pen Needle, FreeStyle Libre 2 Reader, Aimovig, phenazopyridine, fluticasone, Emgality, insulin detemir, estradiol, pantoprazole, fludrocortisone, FeroSul, rosuvastatin, traZODone, buPROPion, Breztri Aerosphere, and irbesartan.  Meds ordered this encounter  Medications   predniSONE (DELTASONE) 20 MG tablet    Sig: Take 2 tablets (40 mg total) by mouth daily with breakfast.    Dispense:  10 tablet    Refill:  0    Order Specific Question:   Supervising Provider    Answer:   Pricilla Holm A [[id number]]   lidocaine (LIDODERM) 5 %    Sig: Place 1 patch onto the skin daily. Remove & Discard patch within 12 hours or as directed by MD    Dispense:  30 patch    Refill:  0    Order Specific Question:   Supervising Provider    Answer:   Pricilla Holm A [[id number]]

## 2022-08-23 NOTE — Patient Instructions (Addendum)
Please go downstairs for labs and an x-ray of your low back before leaving today.  I prescribed a 5-day course of oral steroids for you to take.  Be aware this will most likely raise your blood sugars.  I also prescribed lidocaine patches for you to use.  We will be in touch with your results.  I placed a referral to Ortho care, an orthopedist office, and they will call you to schedule a visit.

## 2022-08-24 ENCOUNTER — Encounter: Payer: Self-pay | Admitting: Physician Assistant

## 2022-08-24 ENCOUNTER — Ambulatory Visit: Payer: 59 | Admitting: Physician Assistant

## 2022-08-24 DIAGNOSIS — M5440 Lumbago with sciatica, unspecified side: Secondary | ICD-10-CM

## 2022-08-24 MED ORDER — METHOCARBAMOL 500 MG PO TABS: 500.0000 mg | ORAL_TABLET | Freq: Three times a day (TID) | ORAL | 0 refills | Status: DC | PRN

## 2022-08-24 NOTE — Progress Notes (Signed)
Office Visit Note   Patient: [name]           Date of Birth: 1966/07/17           MRN: [id number] Visit Date: 08/24/2022              Requested by: Girtha Rm, NP-C [address] [place],  [place] [address] PCP: Janith Lima, MD  No chief complaint on file.     HPI: [name] presents today with a 5-week history of low back pain.  She denies any injury or problems in the past other than "a pulled muscle "though she recalls going on a 2-hour car ride back in July to go on vacation and the pain began while she was on vacation.  She denies any fever or chills.  She tried to ignore this for a while because she just started a new job that requires a lot of sitting and did not want to compromise her job.  Denies any loss of bowel or bladder control.  Pain became significantly worse and she was seen by her primary care provider yesterday.  She denies any fever or chills.  The pain is focused in the central lower back and radiates down into the right leg.  She has some intermittent paresthesias in the leg but no weakness.  Her primary care provider did put her on a prednisone Dosepak but she said she forgot to take the pill this morning which would have been her first pill.  She has had some minimal relief with application of Voltaren gel to her lower back.  Assessment & Plan: Visit Diagnoses: Low back pain  Plan: Patient with exacerbation of lower back pain.  X-rays done yesterday demonstrate multi mild degenerative changes specially in the facet joints.  She does have good intervertebral disc spaces.  She did have an MRI of her lumbar spine in 2018 which was were unremarkable.  She does have a straight leg raise on the right but is neurologically intact with good strength and sensation.  I have advised her to complete the Medrol Dosepak as I think this could be very helpful to her.  Should continue with the Voltaren.  She will call if she does not improve next week and we can consider  an MRI.  If she has good relief I would favor physical therapy and perhaps a back support.  I also cautioned her that while taking the steroid she should monitor her blood sugars and also to make sure she takes this with food  Follow-Up Instructions: Return in about 2 weeks (around 09/07/2022).   Ortho Exam  Patient is alert, oriented, no adenopathy, well-dressed, normal affect, normal respiratory effort. Examination of her lower back she has no redness no erythema.  She alternate sitting and standing because of discomfort.  Her strength is 5 out of 5 bilaterally with dorsiflexion plantarflexion of her ankles extension and flexion of her knees she is able to sustain a straight leg raise bilaterally.  Sensation is intact she can feel her feet.  She does have reproduction of her symptoms in her back with right leg straight leg raise  Imaging: DG Lumbar Spine Complete  Result Date: 08/23/2022 CLINICAL DATA:  Acute lower back pain without known injury. EXAM: LUMBAR SPINE - COMPLETE 4+ VIEW COMPARISON:  None Available. FINDINGS: There is no evidence of lumbar spine fracture. Alignment is normal. Intervertebral disc spaces are maintained. Mild anterior osteophyte formation is noted at L2-3, L3-4  and L4-5. IMPRESSION: Mild multilevel degenerative changes are noted. No acute abnormality seen. Electronically Signed   By: Marijo Conception M.D.   On: 08/23/2022 21:46   No images are attached to the encounter.  Labs: Lab Results  Component Value Date   HGBA1C 7.0 (H) 02/13/2022   HGBA1C 7.3 (H) 09/26/2021   HGBA1C 6.6 (A) 05/04/2021   ESRSEDRATE 20 09/25/2016   REPTSTATUS 09/14/2017 FINAL 09/09/2017   CULT NO GROWTH 5 DAYS 09/09/2017   LABORGA  10/29/2016    Multiple organisms present,each less than 10,000 CFU/mL. These organisms,commonly found on external and internal genitalia,are considered colonizers. No further testing performed.      Lab Results  Component Value Date   ALBUMIN 3.5  05/09/2021   ALBUMIN 3.4 (L) 03/20/2021   ALBUMIN 3.1 (L) 04/27/2020    Lab Results  Component Value Date   MG 2.0 09/26/2021   MG 2.1 05/04/2021   MG 2.1 08/22/2020   Lab Results  Component Value Date   VD25OH 38.99 10/20/2018    No results found for: "PREALBUMIN"    Latest Ref Rng & Units 08/23/2022   10:24 AM 02/13/2022    1:43 PM 05/09/2021    3:54 PM  CBC EXTENDED  WBC 4.0 - 10.5 K/uL 11.5  8.1    RBC 3.87 - 5.11 Mil/uL 4.75  4.64    Hemoglobin 12.0 - 15.0 g/dL 15.1  15.1  13.6   HCT 36.0 - 46.0 % 44.2  44.5  40.0   Platelets 150.0 - 400.0 K/uL 370.0  337.0    NEUT# 1.4 - 7.7 K/uL 6.8  4.7    Lymph# 0.7 - 4.0 K/uL 3.6  2.7       There is no height or weight on file to calculate BMI.  Orders:  No orders of the defined types were placed in this encounter.  Meds ordered this encounter  Medications   methocarbamol (ROBAXIN) 500 MG tablet    Sig: Take 1 tablet (500 mg total) by mouth every 8 (eight) hours as needed for muscle spasms.    Dispense:  30 tablet    Refill:  0     Procedures: No procedures performed  Clinical Data: No additional findings.  ROS:  All other systems negative, except as noted in the HPI. Review of Systems  All other systems reviewed and are negative.   Objective: Vital Signs: LMP 02/12/2014   Specialty Comments:  No specialty comments available.  PMFS History: Patient Active Problem List   Diagnosis Date Noted   Subacute maxillary sinusitis 02/13/2022   Estrogen deficiency 01/11/2022   Insulin-requiring or dependent type II diabetes mellitus ([place]) 11/24/2021   Hypokalemia 04/13/2021   Stroke (cerebrum) ([place]) 03/20/2021   Autonomic dysfunction 08/22/2020   Paroxysmal SVT (supraventricular tachycardia) ([place]) 07/25/2020   Menopausal vaginal dryness 07/25/2020   Hot flashes, menopausal 11/10/2019   Bilateral hearing loss 11/10/2019   Dietary iron deficiency 09/01/2019   Lumbar radiculitis 02/10/2019   Gastroesophageal  reflux disease with esophagitis 02/10/2019   Essential hypertension 10/20/2018   Chronic migraine without aura, with intractable migraine, so stated, with status migrainosus 07/02/2018   Thiamine deficiency 01/05/2018   Moderate episode of recurrent major depressive disorder ([place]) 05/16/2017   Diabetes mellitus with complication ([facility])    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   Tobacco abuse 01/08/2017   Chronic bronchitis ([place]) 10/20/2015   Dyslipidemia, goal LDL below 70 04/12/2015   Low back pain 09/28/2014   Migraine with aura, intractable 08/12/2014  Routine general medical examination at a health care facility 05/24/2011   Past Medical History:  Diagnosis Date   Anxiety    Atypical ductal hyperplasia of left breast    Bronchitis    uses inhaler if needed for bronchitis, lasted used -2014   Cataract    bilateral   Chronic headaches    migraines - in past, uses Phenergan for nausea    COPD (chronic obstructive pulmonary disease) ([place])    Deficiency anemia 12/30/2017   Depression    Diabetes mellitus without complication ([place])    taken off Metformin since 04/2014, HgbA1C - normal, will follow up with PCP- Dr. Deborra Medina, 07/2014   GERD (gastroesophageal reflux disease)    H/O exercise stress test 2011   done at [facility]- told that it was WNL, done due to pt. having panic attacks    History of blood transfusion [birth year]26/1966   at birth in [place], [place], unsure number of units   PONV (postoperative nausea and vomiting)    Poor dentition    very poor oral health    SVD (spontaneous vaginal delivery)    x 3   Tobacco abuse    Wears glasses     Family History  Problem Relation Age of Onset   Diabetes Mother    Hyperlipidemia Mother    Hypertension Mother    Anxiety disorder Mother    Depression Mother    Drug abuse Mother    Stroke Mother        x3   Prostate cancer Father    Alcohol abuse Father    Lung cancer Maternal Grandfather        smoked   Emphysema Maternal Grandfather         smoked   COPD Maternal Grandmother        never smoked    Dementia Maternal Grandmother    Thyroid cancer Paternal Aunt    Colon cancer Neg Hx    Rectal cancer Neg Hx    Stomach cancer Neg Hx    Migraines Neg Hx     Past Surgical History:  Procedure Laterality Date   ABDOMINAL HYSTERECTOMY     APPENDECTOMY  1988   BREAST LUMPECTOMY WITH RADIOACTIVE SEED LOCALIZATION Left 03/17/2020   Procedure: LEFT BREAST LUMPECTOMY WITH RADIOACTIVE SEED LOCALIZATION;  Surgeon: Coralie Keens, MD;  Location: [facility];  Service: General;  Laterality: Left;  LMA   CARDIAC CATHETERIZATION N/A 08/17/2016   Procedure: Left Heart Cath and Coronary Angiography;  Surgeon: Burnell Blanks, MD;  Location: [facility];  Service: Cardiovascular;  Laterality: N/A;   CHOLECYSTECTOMY N/A 06/07/2014   Procedure: LAPAROSCOPIC CHOLECYSTECTOMY;  Surgeon: Ralene Ok, MD;  Location: [facility];  Service: General;  Laterality: N/A;   CHOLECYSTECTOMY  June 07 2014   COLONOSCOPY  02/16/2014   normal    ESOPHAGOGASTRODUODENOSCOPY  12/28/2013   KNEE ARTHROSCOPY  1995   left   LAPAROSCOPIC ASSISTED VAGINAL HYSTERECTOMY N/A 04/15/2014   Procedure: LAPAROSCOPIC ASSISTED VAGINAL HYSTERECTOMY;  Surgeon: Marylynn Pearson, MD;  Location: [facility];  Service: Gynecology;  Laterality: N/A;   LAPAROSCOPIC BILATERAL SALPINGO OOPHERECTOMY Bilateral 04/15/2014   Procedure: LAPAROSCOPIC BILATERAL SALPINGO OOPHORECTOMY;  Surgeon: Marylynn Pearson, MD;  Location: [facility];  Service: Gynecology;  Laterality: Bilateral;   TUBAL LIGATION     Social History   Occupational History   Occupation:      Comment: helps run a friends business  Tobacco Use   Smoking status: Every Day  Packs/day: 2.00    Years: 35.00    Total pack years: 70.00    Types: Cigarettes   Smokeless tobacco: Never  Vaping Use   Vaping Use: Never used  Substance and Sexual Activity   Alcohol use: No    Alcohol/week: 0.0 standard drinks of alcohol   Drug  use: No   Sexual activity: Yes    Partners: Male    Birth control/protection: Surgical

## 2022-09-07 ENCOUNTER — Ambulatory Visit: Payer: 59 | Admitting: Physician Assistant

## 2022-09-16 ENCOUNTER — Other Ambulatory Visit: Payer: Self-pay | Admitting: Internal Medicine

## 2022-09-16 DIAGNOSIS — I959 Hypotension, unspecified: Secondary | ICD-10-CM

## 2022-09-16 DIAGNOSIS — K2100 Gastro-esophageal reflux disease with esophagitis, without bleeding: Secondary | ICD-10-CM

## 2022-09-16 DIAGNOSIS — E611 Iron deficiency: Secondary | ICD-10-CM

## 2022-09-20 ENCOUNTER — Ambulatory Visit: Payer: 59 | Admitting: Physician Assistant

## 2022-10-31 ENCOUNTER — Ambulatory Visit: Payer: 59 | Admitting: Family Medicine

## 2022-10-31 ENCOUNTER — Encounter: Payer: Self-pay | Admitting: Family Medicine

## 2022-10-31 VITALS — BP 144/81 | HR 67 | Temp 98.1°F | Resp 16 | Ht 62.0 in | Wt 135.2 lb

## 2022-10-31 DIAGNOSIS — E118 Type 2 diabetes mellitus with unspecified complications: Secondary | ICD-10-CM | POA: Diagnosis not present

## 2022-10-31 DIAGNOSIS — F419 Anxiety disorder, unspecified: Secondary | ICD-10-CM

## 2022-10-31 DIAGNOSIS — F32A Depression, unspecified: Secondary | ICD-10-CM

## 2022-10-31 DIAGNOSIS — I10 Essential (primary) hypertension: Secondary | ICD-10-CM | POA: Diagnosis not present

## 2022-10-31 DIAGNOSIS — Z23 Encounter for immunization: Secondary | ICD-10-CM | POA: Diagnosis not present

## 2022-10-31 DIAGNOSIS — Z7689 Persons encountering health services in other specified circumstances: Secondary | ICD-10-CM

## 2022-10-31 MED ORDER — IRBESARTAN 300 MG PO TABS: ORAL_TABLET | ORAL | 0 refills | Status: DC

## 2022-10-31 MED ORDER — FREESTYLE LIBRE 2 READER DEVI: 1.0000 | Freq: Every day | 5 refills | Status: DC

## 2022-10-31 MED ORDER — FREESTYLE LIBRE 2 SENSOR MISC: 5 refills | Status: DC

## 2022-10-31 MED ORDER — MIRTAZAPINE 15 MG PO TABS: 15.0000 mg | ORAL_TABLET | Freq: Every day | ORAL | 0 refills | Status: DC

## 2022-10-31 NOTE — Progress Notes (Signed)
Patient is here to established care with provider today. Patient has many health concern they would like to discuss with provider today Patient has taken herself off all meds. [place] Office Visit from 10/31/2022 in Primary Care at [facility] Total Score 4       Care gaps discuss at appointment today

## 2022-10-31 NOTE — Progress Notes (Signed)
New Patient Office Visit  Subjective    Patient ID: Ashley [name], female    DOB: 1966-05-18  Age: 56 y.o. MRN: [id number]  CC:  Chief Complaint  Patient presents with   Establish Care    HPI Ashley [name] presents to establish care and for review of chronic med issues. Patient reports that she has not been under care by PCP for 1-2 years and has not been taking any medications.    Outpatient Encounter Medications as of 10/31/2022  Medication Sig   insulin detemir (LEVEMIR) 100 UNIT/ML FlexPen Inject 20 Units into the skin daily.   mirtazapine (REMERON) 15 MG tablet Take 1 tablet (15 mg total) by mouth at bedtime.   traZODone (DESYREL) 150 MG tablet TAKE 1 TABLET(150 MG) BY MOUTH AT BEDTIME   albuterol (VENTOLIN HFA) 108 (90 Base) MCG/ACT inhaler INHALE 2 PUFFS INTO THE LUNGS EVERY 6 HOURS AS NEEDED FOR WHEEZING OR SHORTNESS OF BREATH (Patient not taking: Reported on 10/31/2022)   AQUALANCE LANCETS 30G MISC  (Patient not taking: Reported on 10/31/2022)   BREZTRI AEROSPHERE 160-9-4.8 MCG/ACT AERO INHALE 2 PUFFS INTO THE LUNGS IN THE MORNING AND AT BEDTIME (Patient not taking: Reported on 10/31/2022)   buPROPion (WELLBUTRIN XL) 150 MG 24 hr tablet TAKE 1 TABLET(150 MG) BY MOUTH DAILY (Patient not taking: Reported on 10/31/2022)   cholecalciferol (VITAMIN D3) 25 MCG (1000 UT) tablet Take 2,000 Units by mouth daily. (Patient not taking: Reported on 10/31/2022)   Continuous Blood Gluc Receiver (FREESTYLE LIBRE 2 READER) DEVI 1 Act by Does not apply route daily.   Continuous Blood Gluc Sensor (FREESTYLE LIBRE 2 SENSOR) MISC USE TO CHECK BLOOD SUGER DAILY   Dulaglutide (TRULICITY) 1.06 YI/9.4WN SOPN SMARTSIG:0.5 Milliliter(s) SUB-Q Once a Week (Patient not taking: Reported on 10/31/2022)   Erenumab-aooe (AIMOVIG) 140 MG/ML SOAJ Inject 140 mg into the skin every 30 (thirty) days. (Patient not taking: Reported on 10/31/2022)   estradiol (CLIMARA - DOSED IN MG/24 HR) 0.0375 mg/24hr patch APPLY 1 PATCH  TOPICALLY TO THE SKIN ONCE WEEKLY (Patient not taking: Reported on 10/31/2022)   FEROSUL 325 (65 Fe) MG tablet TAKE 1 TABLET(325 MG) BY MOUTH TWICE DAILY WITH A MEAL (Patient not taking: Reported on 10/31/2022)   fludrocortisone (FLORINEF) 0.1 MG tablet TAKE 1 TABLET(0.1 MG) BY MOUTH DAILY (Patient not taking: Reported on 10/31/2022)   fluticasone (FLONASE) 50 MCG/ACT nasal spray Place 2 sprays into both nostrils daily. (Patient not taking: Reported on 10/31/2022)   Galcanezumab-gnlm (EMGALITY) 120 MG/ML SOAJ Inject 120 mg into the skin every 30 (thirty) days. (Patient not taking: Reported on 10/31/2022)   insulin aspart (FIASP) 100 UNIT/ML FlexTouch Pen Inject 10 Units into the skin 3 (three) times daily with meals. (Patient not taking: Reported on 10/31/2022)   Insulin Pen Needle 32G X 6 MM MISC Inject 1 Act into the skin 4 (four) times daily. Use daily as directed (Patient not taking: Reported on 10/31/2022)   irbesartan (AVAPRO) 300 MG tablet TAKE 1 TABLET(300 MG) BY MOUTH DAILY   Lancet Devices (ADJUSTABLE LANCING DEVICE) MISC Up to tid testing (Patient not taking: Reported on 10/31/2022)   lidocaine (LIDODERM) 5 % Place 1 patch onto the skin daily. Remove & Discard patch within 12 hours or as directed by MD (Patient not taking: Reported on 10/31/2022)   methocarbamol (ROBAXIN) 500 MG tablet Take 1 tablet (500 mg total) by mouth every 8 (eight) hours as needed for muscle spasms. (Patient not taking: Reported on 10/31/2022)  metoprolol tartrate (LOPRESSOR) 50 MG tablet Take 1 tablet (50 mg total) by mouth 2 (two) times daily. (Patient not taking: Reported on 10/31/2022)   pantoprazole (PROTONIX) 40 MG tablet TAKE 1 TABLET(40 MG) BY MOUTH DAILY (Patient not taking: Reported on 10/31/2022)   phenazopyridine (PYRIDIUM) 100 MG tablet Take 1 tablet (100 mg total) by mouth 3 (three) times daily as needed for pain. (Patient not taking: Reported on 10/31/2022)   potassium chloride SA (KLOR-CON) 20 MEQ tablet Take 1  tablet (20 mEq total) by mouth 3 (three) times daily. (Patient not taking: Reported on 10/31/2022)   predniSONE (DELTASONE) 20 MG tablet Take 2 tablets (40 mg total) by mouth daily with breakfast. (Patient not taking: Reported on 10/31/2022)   promethazine (PHENERGAN) 12.5 MG tablet Take 1 tablet (12.5 mg total) by mouth every 6 (six) hours as needed for nausea or vomiting. (Patient not taking: Reported on 10/31/2022)   rosuvastatin (CRESTOR) 20 MG tablet TAKE 1 TABLET BY MOUTH EVERY DAY (Patient not taking: Reported on 10/31/2022)   thiamine (VITAMIN B-1) 50 MG tablet Take 1 tablet (50 mg total) by mouth daily. (Patient not taking: Reported on 10/31/2022)   venlafaxine XR (EFFEXOR-XR) 150 MG 24 hr capsule TAKE 1 CAPSULE(150 MG) BY MOUTH DAILY WITH BREAKFAST (Patient not taking: Reported on 10/31/2022)   [DISCONTINUED] Continuous Blood Gluc Receiver (FREESTYLE LIBRE 2 READER) DEVI 1 Act by Does not apply route daily. (Patient not taking: Reported on 10/31/2022)   [DISCONTINUED] Continuous Blood Gluc Sensor (FREESTYLE LIBRE 2 SENSOR) MISC USE TO CHECK BLOOD SUGER DAILY (Patient not taking: Reported on 10/31/2022)   [DISCONTINUED] irbesartan (AVAPRO) 300 MG tablet TAKE 1 TABLET(300 MG) BY MOUTH DAILY (Patient not taking: Reported on 10/31/2022)   No facility-administered encounter medications on file as of 10/31/2022.    Past Medical History:  Diagnosis Date   Anxiety    Atypical ductal hyperplasia of left breast    Bronchitis    uses inhaler if needed for bronchitis, lasted used -2014   Cataract    bilateral   Chronic headaches    migraines - in past, uses Phenergan for nausea    COPD (chronic obstructive pulmonary disease) ([place])    Deficiency anemia 12/30/2017   Depression    Diabetes mellitus without complication ([place])    taken off Metformin since 04/2014, HgbA1C - normal, will follow up with PCP- Dr. Deborra Medina, 07/2014   GERD (gastroesophageal reflux disease)    H/O exercise stress test 2011   done at  [facility]- told that it was WNL, done due to pt. having panic attacks    History of blood transfusion June 26, 1966   at birth in [place], [place], unsure number of units   PONV (postoperative nausea and vomiting)    Poor dentition    very poor oral health    SVD (spontaneous vaginal delivery)    x 3   Tobacco abuse    Wears glasses     Past Surgical History:  Procedure Laterality Date   ABDOMINAL HYSTERECTOMY     APPENDECTOMY  1988   BREAST LUMPECTOMY WITH RADIOACTIVE SEED LOCALIZATION Left 03/17/2020   Procedure: LEFT BREAST LUMPECTOMY WITH RADIOACTIVE SEED LOCALIZATION;  Surgeon: Coralie Keens, MD;  Location: [place];  Service: General;  Laterality: Left;  LMA   CARDIAC CATHETERIZATION N/A 08/17/2016   Procedure: Left Heart Cath and Coronary Angiography;  Surgeon: Burnell Blanks, MD;  Location: [place] [facility];  Service: Cardiovascular;  Laterality: N/A;   CHOLECYSTECTOMY N/A 06/07/2014  Procedure: LAPAROSCOPIC CHOLECYSTECTOMY;  Surgeon: Ralene Ok, MD;  Location: [place];  Service: General;  Laterality: N/A;   CHOLECYSTECTOMY  June 07 2014   COLONOSCOPY  02/16/2014   normal    ESOPHAGOGASTRODUODENOSCOPY  12/28/2013   KNEE ARTHROSCOPY  1995   left   LAPAROSCOPIC ASSISTED VAGINAL HYSTERECTOMY N/A 04/15/2014   Procedure: LAPAROSCOPIC ASSISTED VAGINAL HYSTERECTOMY;  Surgeon: Marylynn Pearson, MD;  Location: [facility];  Service: Gynecology;  Laterality: N/A;   LAPAROSCOPIC BILATERAL SALPINGO OOPHERECTOMY Bilateral 04/15/2014   Procedure: LAPAROSCOPIC BILATERAL SALPINGO OOPHORECTOMY;  Surgeon: Marylynn Pearson, MD;  Location: [facility];  Service: Gynecology;  Laterality: Bilateral;   TUBAL LIGATION      Family History  Problem Relation Age of Onset   Diabetes Mother    Hyperlipidemia Mother    Hypertension Mother    Anxiety disorder Mother    Depression Mother    Drug abuse Mother    Stroke Mother        x3   Prostate cancer Father    Alcohol abuse Father    Lung cancer  Maternal Grandfather        smoked   Emphysema Maternal Grandfather        smoked   COPD Maternal Grandmother        never smoked    Dementia Maternal Grandmother    Thyroid cancer Paternal Aunt    Colon cancer Neg Hx    Rectal cancer Neg Hx    Stomach cancer Neg Hx    Migraines Neg Hx     Social History   Socioeconomic History   Marital status: Married    Spouse name: Alvester Chou   Number of children: 6   Years of education: 12   Highest education level: Not on file  Occupational History   Occupation:      Comment: helps run a friends business  Tobacco Use   Smoking status: Every Day    Packs/day: 2.00    Years: 35.00    Total pack years: 70.00    Types: Cigarettes   Smokeless tobacco: Never  Vaping Use   Vaping Use: Never used  Substance and Sexual Activity   Alcohol use: No    Alcohol/week: 0.0 standard drinks of alcohol   Drug use: No   Sexual activity: Yes    Partners: Male    Birth control/protection: Surgical  Other Topics Concern   Not on file  Social History Narrative   5 living children, one child is a crack cocaine addict who often breaks into their house to steal money. He is currently in jail. One child died of cerebral palsy.   Caffeine use: Dr Malachi Bonds (3 per day)   2 cups coffee per day   Online student, currently not working   Social Determinants of Radio broadcast assistant Strain: Not on file  Food Insecurity: Not on file  Transportation Needs: Not on file  Physical Activity: Not on file  Stress: Not on file  Social Connections: Not on file  Intimate Partner Violence: Not on file    Review of Systems  Psychiatric/Behavioral:  Negative for suicidal ideas. The patient is nervous/anxious.   All other systems reviewed and are negative.       Objective    BP (!) 144/81   Pulse 67   Temp 98.1 F (36.7 C) (Oral)   Resp 16   Ht '5\' 2"'$  (1.575 m)   Wt 135 lb 3.2 oz (61.3 kg)   LMP 02/12/2014  SpO2 96%   BMI 24.73 kg/m   Physical  Exam Vitals and nursing note reviewed.  Constitutional:      General: She is not in acute distress. Cardiovascular:     Rate and Rhythm: Normal rate and regular rhythm.  Pulmonary:     Effort: Pulmonary effort is normal.     Breath sounds: Normal breath sounds.  Abdominal:     Palpations: Abdomen is soft.     Tenderness: There is no abdominal tenderness.  Neurological:     General: No focal deficit present.     Mental Status: She is alert and oriented to person, place, and time.         Assessment & Plan:   1. Essential hypertension Will prescribe Avapro '300mg'$  daily and monitor - irbesartan (AVAPRO) 300 MG tablet; TAKE 1 TABLET(300 MG) BY MOUTH DAILY  Dispense: 90 tablet; Refill: 0 - Basic Metabolic Panel - AST - ALT - Lipid Panel  2. Diabetes mellitus with complication [facility]) Patient reports elevated blood sugars in the 2-300 generally. Will increase levemir from 30 units at hs to 20 units bid. Monitor was prescribed. Monitoring labs ordered and pending. Discussed compliance. - Continuous Blood Gluc Receiver (FREESTYLE LIBRE 2 READER) DEVI; 1 Act by Does not apply route daily.  Dispense: 2 each; Refill: 5 - Continuous Blood Gluc Sensor (FREESTYLE LIBRE 2 SENSOR) MISC; USE TO CHECK BLOOD SUGER DAILY  Dispense: 2 each; Refill: 5 - Basic Metabolic Panel - AST - ALT - Lipid Panel - Hemoglobin A1c  3. Anxiety and depression Remron 15 mg prescribed  4. Need for immunization against influenza  - Flu Vaccine QUAD 92moIM (Fluarix, Fluzone & Alfiuria Quad PF)  5. Encounter to establish care     Return in about 19 days (around 11/19/2022) for may DB if necessaryt.   WBecky Sax MD

## 2022-11-01 LAB — BASIC METABOLIC PANEL
BUN/Creatinine Ratio: 10 (ref 9–23)
BUN: 7 mg/dL (ref 6–24)
CO2: 24 mmol/L (ref 20–29)
Calcium: 9.3 mg/dL (ref 8.7–10.2)
Chloride: 99 mmol/L (ref 96–106)
Creatinine, Ser: 0.72 mg/dL (ref 0.57–1.00)
Glucose: 293 mg/dL — ABNORMAL HIGH (ref 70–99)
Potassium: 4.5 mmol/L (ref 3.5–5.2)
Sodium: 138 mmol/L (ref 134–144)
eGFR: 98 mL/min/{1.73_m2} (ref >59)

## 2022-11-01 LAB — AST: AST: 14 IU/L (ref 0–40)

## 2022-11-01 LAB — LIPID PANEL
Chol/HDL Ratio: 4.6 ratio — ABNORMAL HIGH (ref 0.0–4.4)
Cholesterol, Total: 217 mg/dL — ABNORMAL HIGH (ref 100–199)
HDL: 47 mg/dL (ref >39)
LDL Chol Calc (NIH): 147 mg/dL — ABNORMAL HIGH (ref 0–99)
Triglycerides: 125 mg/dL (ref 0–149)
VLDL Cholesterol Cal: 23 mg/dL (ref 5–40)

## 2022-11-01 LAB — HEMOGLOBIN A1C
Est. average glucose Bld gHb Est-mCnc: 303 mg/dL
Hgb A1c MFr Bld: 12.2 % — ABNORMAL HIGH (ref 4.8–5.6)

## 2022-11-01 LAB — ALT: ALT: 12 IU/L (ref 0–32)

## 2022-11-06 ENCOUNTER — Ambulatory Visit (INDEPENDENT_AMBULATORY_CARE_PROVIDER_SITE_OTHER): Payer: 59 | Admitting: Family Medicine

## 2022-11-06 ENCOUNTER — Encounter: Payer: Self-pay | Admitting: Family Medicine

## 2022-11-06 DIAGNOSIS — B349 Viral infection, unspecified: Secondary | ICD-10-CM

## 2022-11-06 NOTE — Progress Notes (Unsigned)
Patient is having, headache,chills,sore throat, stuff head, and chills. Patient has sx x 4 days.  Patient has been  taking tylenol capsules.

## 2022-11-07 LAB — COVID-19, FLU A+B AND RSV
Influenza A, NAA: NOT DETECTED
Influenza B, NAA: NOT DETECTED
RSV, NAA: NOT DETECTED
SARS-CoV-2, NAA: NOT DETECTED

## 2022-11-08 NOTE — Progress Notes (Signed)
Established Patient Office Visit  Subjective    Patient ID: Ashley [name], female    DOB: 01/17/1966  Age: 56 y.o. MRN: [id number]  CC:  Chief Complaint  Patient presents with   Chills    HPI ROYA GIESELMAN presents for sx of headaches, fever, bodyaches, chills, for about 5 days. She has been taking tylenol for sx. Patient reports co-workers have had similar sx.    Outpatient Encounter Medications as of 11/06/2022  Medication Sig   albuterol (VENTOLIN HFA) 108 (90 Base) MCG/ACT inhaler INHALE 2 PUFFS INTO THE LUNGS EVERY 6 HOURS AS NEEDED FOR WHEEZING OR SHORTNESS OF BREATH (Patient not taking: Reported on 10/31/2022)   AQUALANCE LANCETS 30G MISC  (Patient not taking: Reported on 10/31/2022)   BREZTRI AEROSPHERE 160-9-4.8 MCG/ACT AERO INHALE 2 PUFFS INTO THE LUNGS IN THE MORNING AND AT BEDTIME (Patient not taking: Reported on 10/31/2022)   buPROPion (WELLBUTRIN XL) 150 MG 24 hr tablet TAKE 1 TABLET(150 MG) BY MOUTH DAILY (Patient not taking: Reported on 10/31/2022)   cholecalciferol (VITAMIN D3) 25 MCG (1000 UT) tablet Take 2,000 Units by mouth daily. (Patient not taking: Reported on 10/31/2022)   Continuous Blood Gluc Receiver (FREESTYLE LIBRE 2 READER) DEVI 1 Act by Does not apply route daily.   Continuous Blood Gluc Sensor (FREESTYLE LIBRE 2 SENSOR) MISC USE TO CHECK BLOOD SUGER DAILY   Dulaglutide (TRULICITY) 9.79 GX/2.1JH SOPN SMARTSIG:0.5 Milliliter(s) SUB-Q Once a Week (Patient not taking: Reported on 10/31/2022)   Erenumab-aooe (AIMOVIG) 140 MG/ML SOAJ Inject 140 mg into the skin every 30 (thirty) days. (Patient not taking: Reported on 10/31/2022)   estradiol (CLIMARA - DOSED IN MG/24 HR) 0.0375 mg/24hr patch APPLY 1 PATCH TOPICALLY TO THE SKIN ONCE WEEKLY (Patient not taking: Reported on 10/31/2022)   FEROSUL 325 (65 Fe) MG tablet TAKE 1 TABLET(325 MG) BY MOUTH TWICE DAILY WITH A MEAL (Patient not taking: Reported on 10/31/2022)   fludrocortisone (FLORINEF) 0.1 MG tablet TAKE 1 TABLET(0.1  MG) BY MOUTH DAILY (Patient not taking: Reported on 10/31/2022)   fluticasone (FLONASE) 50 MCG/ACT nasal spray Place 2 sprays into both nostrils daily. (Patient not taking: Reported on 10/31/2022)   Galcanezumab-gnlm (EMGALITY) 120 MG/ML SOAJ Inject 120 mg into the skin every 30 (thirty) days. (Patient not taking: Reported on 10/31/2022)   insulin aspart (FIASP) 100 UNIT/ML FlexTouch Pen Inject 10 Units into the skin 3 (three) times daily with meals. (Patient not taking: Reported on 10/31/2022)   insulin detemir (LEVEMIR) 100 UNIT/ML FlexPen Inject 20 Units into the skin daily.   Insulin Pen Needle 32G X 6 MM MISC Inject 1 Act into the skin 4 (four) times daily. Use daily as directed (Patient not taking: Reported on 10/31/2022)   irbesartan (AVAPRO) 300 MG tablet TAKE 1 TABLET(300 MG) BY MOUTH DAILY   Lancet Devices (ADJUSTABLE LANCING DEVICE) MISC Up to tid testing (Patient not taking: Reported on 10/31/2022)   lidocaine (LIDODERM) 5 % Place 1 patch onto the skin daily. Remove & Discard patch within 12 hours or as directed by MD (Patient not taking: Reported on 10/31/2022)   methocarbamol (ROBAXIN) 500 MG tablet Take 1 tablet (500 mg total) by mouth every 8 (eight) hours as needed for muscle spasms. (Patient not taking: Reported on 10/31/2022)   metoprolol tartrate (LOPRESSOR) 50 MG tablet Take 1 tablet (50 mg total) by mouth 2 (two) times daily. (Patient not taking: Reported on 10/31/2022)   mirtazapine (REMERON) 15 MG tablet Take 1 tablet (15 mg total) by  mouth at bedtime.   pantoprazole (PROTONIX) 40 MG tablet TAKE 1 TABLET(40 MG) BY MOUTH DAILY (Patient not taking: Reported on 10/31/2022)   phenazopyridine (PYRIDIUM) 100 MG tablet Take 1 tablet (100 mg total) by mouth 3 (three) times daily as needed for pain. (Patient not taking: Reported on 10/31/2022)   potassium chloride SA (KLOR-CON) 20 MEQ tablet Take 1 tablet (20 mEq total) by mouth 3 (three) times daily. (Patient not taking: Reported on 10/31/2022)    predniSONE (DELTASONE) 20 MG tablet Take 2 tablets (40 mg total) by mouth daily with breakfast. (Patient not taking: Reported on 10/31/2022)   promethazine (PHENERGAN) 12.5 MG tablet Take 1 tablet (12.5 mg total) by mouth every 6 (six) hours as needed for nausea or vomiting. (Patient not taking: Reported on 10/31/2022)   rosuvastatin (CRESTOR) 20 MG tablet TAKE 1 TABLET BY MOUTH EVERY DAY (Patient not taking: Reported on 10/31/2022)   thiamine (VITAMIN B-1) 50 MG tablet Take 1 tablet (50 mg total) by mouth daily. (Patient not taking: Reported on 10/31/2022)   traZODone (DESYREL) 150 MG tablet TAKE 1 TABLET(150 MG) BY MOUTH AT BEDTIME   venlafaxine XR (EFFEXOR-XR) 150 MG 24 hr capsule TAKE 1 CAPSULE(150 MG) BY MOUTH DAILY WITH BREAKFAST (Patient not taking: Reported on 10/31/2022)   No facility-administered encounter medications on file as of 11/06/2022.    Past Medical History:  Diagnosis Date   Anxiety    Atypical ductal hyperplasia of left breast    Bronchitis    uses inhaler if needed for bronchitis, lasted used -2014   Cataract    bilateral   Chronic headaches    migraines - in past, uses Phenergan for nausea    COPD (chronic obstructive pulmonary disease) ([place])    Deficiency anemia 12/30/2017   Depression    Diabetes mellitus without complication ([place])    taken off Metformin since 04/2014, HgbA1C - normal, will follow up with PCP- Dr. Deborra Medina, 07/2014   GERD (gastroesophageal reflux disease)    H/O exercise stress test 2011   done at [facility]- told that it was WNL, done due to pt. having panic attacks    History of blood transfusion [birth year]/24[birth year]66   at birth in [place], [place], unsure number of units   PONV (postoperative nausea and vomiting)    Poor dentition    very poor oral health    SVD (spontaneous vaginal delivery)    x 3   Tobacco abuse    Wears glasses     Past Surgical History:  Procedure Laterality Date   ABDOMINAL HYSTERECTOMY     APPENDECTOMY  1988   BREAST LUMPECTOMY WITH  RADIOACTIVE SEED LOCALIZATION Left 03/17/2020   Procedure: LEFT BREAST LUMPECTOMY WITH RADIOACTIVE SEED LOCALIZATION;  Surgeon: Coralie Keens, MD;  Location: [place];  Service: General;  Laterality: Left;  LMA   CARDIAC CATHETERIZATION N/A 08/17/2016   Procedure: Left Heart Cath and Coronary Angiography;  Surgeon: Burnell Blanks, MD;  Location: [place] [facility];  Service: Cardiovascular;  Laterality: N/A;   CHOLECYSTECTOMY N/A 06/07/2014   Procedure: LAPAROSCOPIC CHOLECYSTECTOMY;  Surgeon: Ralene Ok, MD;  Location: [place];  Service: General;  Laterality: N/A;   CHOLECYSTECTOMY  June 07 2014   COLONOSCOPY  02/16/2014   normal    ESOPHAGOGASTRODUODENOSCOPY  12/28/2013   KNEE ARTHROSCOPY  1995   left   LAPAROSCOPIC ASSISTED VAGINAL HYSTERECTOMY N/A 04/15/2014   Procedure: LAPAROSCOPIC ASSISTED VAGINAL HYSTERECTOMY;  Surgeon: Marylynn Pearson, MD;  Location: [facility];  Service: Gynecology;  Laterality: N/A;   LAPAROSCOPIC BILATERAL SALPINGO OOPHERECTOMY Bilateral 04/15/2014   Procedure: LAPAROSCOPIC BILATERAL SALPINGO OOPHORECTOMY;  Surgeon: Marylynn Pearson, MD;  Location: [facility];  Service: Gynecology;  Laterality: Bilateral;   TUBAL LIGATION      Family History  Problem Relation Age of Onset   Diabetes Mother    Hyperlipidemia Mother    Hypertension Mother    Anxiety disorder Mother    Depression Mother    Drug abuse Mother    Stroke Mother        x3   Prostate cancer Father    Alcohol abuse Father    Lung cancer Maternal Grandfather        smoked   Emphysema Maternal Grandfather        smoked   COPD Maternal Grandmother        never smoked    Dementia Maternal Grandmother    Thyroid cancer Paternal Aunt    Colon cancer Neg Hx    Rectal cancer Neg Hx    Stomach cancer Neg Hx    Migraines Neg Hx     Social History   Socioeconomic History   Marital status: Married    Spouse name: Alvester Chou   Number of children: 6   Years of education: 12   Highest education level:  Not on file  Occupational History   Occupation:      Comment: helps run a friends business  Tobacco Use   Smoking status: Every Day    Packs/day: 2.00    Years: 35.00    Total pack years: 70.00    Types: Cigarettes   Smokeless tobacco: Never  Vaping Use   Vaping Use: Never used  Substance and Sexual Activity   Alcohol use: No    Alcohol/week: 0.0 standard drinks of alcohol   Drug use: No   Sexual activity: Yes    Partners: Male    Birth control/protection: Surgical  Other Topics Concern   Not on file  Social History Narrative   5 living children, one child is a crack cocaine addict who often breaks into their house to steal money. He is currently in jail. One child died of cerebral palsy.   Caffeine use: Dr Malachi Bonds (3 per day)   2 cups coffee per day   Online student, currently not working   Social Determinants of Radio broadcast assistant Strain: Not on file  Food Insecurity: Not on file  Transportation Needs: Not on file  Physical Activity: Not on file  Stress: Not on file  Social Connections: Not on file  Intimate Partner Violence: Not on file    Review of Systems  Constitutional:  Positive for chills and fever.  Neurological:  Positive for headaches.  All other systems reviewed and are negative.       Objective    LMP 02/12/2014   Physical Exam Vitals and nursing note reviewed.  Constitutional:      General: She is not in acute distress. Cardiovascular:     Rate and Rhythm: Normal rate and regular rhythm.  Pulmonary:     Effort: Pulmonary effort is normal.     Breath sounds: Normal breath sounds.  Abdominal:     Palpations: Abdomen is soft.     Tenderness: There is no abdominal tenderness.  Skin:    General: Skin is warm and dry.  Neurological:     General: No focal deficit present.     Mental Status: She is alert and oriented to person, place,  and time.         Assessment & Plan:   1. Viral syndrome Recommend conservative management  including nsiads/tylenol and plenty of fluids and rest.  - COVID-19, Flu A+B and RSV    No follow-ups on file.   Becky Sax, MD

## 2022-11-20 ENCOUNTER — Encounter: Payer: Self-pay | Admitting: Family Medicine

## 2022-11-20 ENCOUNTER — Ambulatory Visit (INDEPENDENT_AMBULATORY_CARE_PROVIDER_SITE_OTHER): Payer: 59 | Admitting: Family Medicine

## 2022-11-20 VITALS — BP 114/73 | HR 82 | Temp 98.1°F | Resp 16 | Wt 136.4 lb

## 2022-11-20 DIAGNOSIS — F99 Mental disorder, not otherwise specified: Secondary | ICD-10-CM

## 2022-11-20 DIAGNOSIS — I10 Essential (primary) hypertension: Secondary | ICD-10-CM

## 2022-11-20 DIAGNOSIS — F411 Generalized anxiety disorder: Secondary | ICD-10-CM

## 2022-11-20 DIAGNOSIS — F5105 Insomnia due to other mental disorder: Secondary | ICD-10-CM

## 2022-11-20 DIAGNOSIS — E785 Hyperlipidemia, unspecified: Secondary | ICD-10-CM | POA: Diagnosis not present

## 2022-11-20 MED ORDER — TRAZODONE HCL 150 MG PO TABS: ORAL_TABLET | ORAL | 1 refills | Status: DC

## 2022-11-20 MED ORDER — ATORVASTATIN CALCIUM 10 MG PO TABS: 10.0000 mg | ORAL_TABLET | Freq: Every day | ORAL | 1 refills | Status: DC

## 2022-11-20 MED ORDER — MIRTAZAPINE 15 MG PO TABS: 15.0000 mg | ORAL_TABLET | Freq: Every day | ORAL | 1 refills | Status: DC

## 2022-11-21 ENCOUNTER — Encounter: Payer: Self-pay | Admitting: Family Medicine

## 2022-11-21 NOTE — Progress Notes (Signed)
Established Patient Office Visit  Subjective    Patient ID: Ashley [name], female    DOB: 10/13/1966  Age: 56 y.o. MRN: [id number]  CC:  Chief Complaint  Patient presents with   Follow-up    HPI Ashley [name] presents for follow up of chronic med issues.    Outpatient Encounter Medications as of 11/20/2022  Medication Sig   atorvastatin (LIPITOR) 10 MG tablet Take 1 tablet (10 mg total) by mouth daily.   albuterol (VENTOLIN HFA) 108 (90 Base) MCG/ACT inhaler INHALE 2 PUFFS INTO THE LUNGS EVERY 6 HOURS AS NEEDED FOR WHEEZING OR SHORTNESS OF BREATH (Patient not taking: Reported on 10/31/2022)   AQUALANCE LANCETS 30G MISC  (Patient not taking: Reported on 10/31/2022)   BREZTRI AEROSPHERE 160-9-4.8 MCG/ACT AERO INHALE 2 PUFFS INTO THE LUNGS IN THE MORNING AND AT BEDTIME (Patient not taking: Reported on 10/31/2022)   buPROPion (WELLBUTRIN XL) 150 MG 24 hr tablet TAKE 1 TABLET(150 MG) BY MOUTH DAILY (Patient not taking: Reported on 10/31/2022)   cholecalciferol (VITAMIN D3) 25 MCG (1000 UT) tablet Take 2,000 Units by mouth daily. (Patient not taking: Reported on 10/31/2022)   Continuous Blood Gluc Receiver (FREESTYLE LIBRE 2 READER) DEVI 1 Act by Does not apply route daily.   Continuous Blood Gluc Sensor (FREESTYLE LIBRE 2 SENSOR) MISC USE TO CHECK BLOOD SUGER DAILY   Dulaglutide (TRULICITY) 7.90 WI/0.9BD SOPN SMARTSIG:0.5 Milliliter(s) SUB-Q Once a Week (Patient not taking: Reported on 10/31/2022)   Erenumab-aooe (AIMOVIG) 140 MG/ML SOAJ Inject 140 mg into the skin every 30 (thirty) days. (Patient not taking: Reported on 10/31/2022)   estradiol (CLIMARA - DOSED IN MG/24 HR) 0.0375 mg/24hr patch APPLY 1 PATCH TOPICALLY TO THE SKIN ONCE WEEKLY (Patient not taking: Reported on 10/31/2022)   FEROSUL 325 (65 Fe) MG tablet TAKE 1 TABLET(325 MG) BY MOUTH TWICE DAILY WITH A MEAL (Patient not taking: Reported on 10/31/2022)   fludrocortisone (FLORINEF) 0.1 MG tablet TAKE 1 TABLET(0.1 MG) BY MOUTH DAILY (Patient  not taking: Reported on 10/31/2022)   fluticasone (FLONASE) 50 MCG/ACT nasal spray Place 2 sprays into both nostrils daily. (Patient not taking: Reported on 10/31/2022)   Galcanezumab-gnlm (EMGALITY) 120 MG/ML SOAJ Inject 120 mg into the skin every 30 (thirty) days. (Patient not taking: Reported on 10/31/2022)   insulin aspart (FIASP) 100 UNIT/ML FlexTouch Pen Inject 10 Units into the skin 3 (three) times daily with meals. (Patient not taking: Reported on 10/31/2022)   insulin detemir (LEVEMIR) 100 UNIT/ML FlexPen Inject 20 Units into the skin daily.   Insulin Pen Needle 32G X 6 MM MISC Inject 1 Act into the skin 4 (four) times daily. Use daily as directed (Patient not taking: Reported on 10/31/2022)   irbesartan (AVAPRO) 300 MG tablet TAKE 1 TABLET(300 MG) BY MOUTH DAILY   Lancet Devices (ADJUSTABLE LANCING DEVICE) MISC Up to tid testing (Patient not taking: Reported on 10/31/2022)   lidocaine (LIDODERM) 5 % Place 1 patch onto the skin daily. Remove & Discard patch within 12 hours or as directed by MD (Patient not taking: Reported on 10/31/2022)   methocarbamol (ROBAXIN) 500 MG tablet Take 1 tablet (500 mg total) by mouth every 8 (eight) hours as needed for muscle spasms. (Patient not taking: Reported on 10/31/2022)   metoprolol tartrate (LOPRESSOR) 50 MG tablet Take 1 tablet (50 mg total) by mouth 2 (two) times daily. (Patient not taking: Reported on 10/31/2022)   mirtazapine (REMERON) 15 MG tablet Take 1 tablet (15 mg total) by mouth at  bedtime.   pantoprazole (PROTONIX) 40 MG tablet TAKE 1 TABLET(40 MG) BY MOUTH DAILY (Patient not taking: Reported on 10/31/2022)   phenazopyridine (PYRIDIUM) 100 MG tablet Take 1 tablet (100 mg total) by mouth 3 (three) times daily as needed for pain. (Patient not taking: Reported on 10/31/2022)   potassium chloride SA (KLOR-CON) 20 MEQ tablet Take 1 tablet (20 mEq total) by mouth 3 (three) times daily. (Patient not taking: Reported on 10/31/2022)   predniSONE (DELTASONE) 20 MG  tablet Take 2 tablets (40 mg total) by mouth daily with breakfast. (Patient not taking: Reported on 10/31/2022)   promethazine (PHENERGAN) 12.5 MG tablet Take 1 tablet (12.5 mg total) by mouth every 6 (six) hours as needed for nausea or vomiting. (Patient not taking: Reported on 10/31/2022)   rosuvastatin (CRESTOR) 20 MG tablet TAKE 1 TABLET BY MOUTH EVERY DAY (Patient not taking: Reported on 10/31/2022)   thiamine (VITAMIN B-1) 50 MG tablet Take 1 tablet (50 mg total) by mouth daily. (Patient not taking: Reported on 10/31/2022)   traZODone (DESYREL) 150 MG tablet TAKE 1 TABLET(150 MG) BY MOUTH AT BEDTIME   venlafaxine XR (EFFEXOR-XR) 150 MG 24 hr capsule TAKE 1 CAPSULE(150 MG) BY MOUTH DAILY WITH BREAKFAST (Patient not taking: Reported on 10/31/2022)   [DISCONTINUED] mirtazapine (REMERON) 15 MG tablet Take 1 tablet (15 mg total) by mouth at bedtime.   [DISCONTINUED] traZODone (DESYREL) 150 MG tablet TAKE 1 TABLET(150 MG) BY MOUTH AT BEDTIME   No facility-administered encounter medications on file as of 11/20/2022.    Past Medical History:  Diagnosis Date   Anxiety    Atypical ductal hyperplasia of left breast    Bronchitis    uses inhaler if needed for bronchitis, lasted used -2014   Cataract    bilateral   Chronic headaches    migraines - in past, uses Phenergan for nausea    COPD (chronic obstructive pulmonary disease) ([place])    Deficiency anemia 12/30/2017   Depression    Diabetes mellitus without complication ([place])    taken off Metformin since 04/2014, HgbA1C - normal, will follow up with PCP- Dr. Deborra Medina, 07/2014   GERD (gastroesophageal reflux disease)    H/O exercise stress test 2011   done at [facility]- told that it was WNL, done due to pt. having panic attacks    History of blood transfusion 06-04-1966   at birth in [place], [place], unsure number of units   PONV (postoperative nausea and vomiting)    Poor dentition    very poor oral health    SVD (spontaneous vaginal delivery)    x 3    Tobacco abuse    Wears glasses     Past Surgical History:  Procedure Laterality Date   ABDOMINAL HYSTERECTOMY     APPENDECTOMY  1988   BREAST LUMPECTOMY WITH RADIOACTIVE SEED LOCALIZATION Left 03/17/2020   Procedure: LEFT BREAST LUMPECTOMY WITH RADIOACTIVE SEED LOCALIZATION;  Surgeon: Coralie Keens, MD;  Location: [facility];  Service: General;  Laterality: Left;  LMA   CARDIAC CATHETERIZATION N/A 08/17/2016   Procedure: Left Heart Cath and Coronary Angiography;  Surgeon: Burnell Blanks, MD;  Location: [facility];  Service: Cardiovascular;  Laterality: N/A;   CHOLECYSTECTOMY N/A 06/07/2014   Procedure: LAPAROSCOPIC CHOLECYSTECTOMY;  Surgeon: Ralene Ok, MD;  Location[facility];  Service: General;  Laterality: N/A;   CHOLECYSTECTOMY  June 07 2014   COLONOSCOPY  02/16/2014   normal    ESOPHAGOGASTRODUODENOSCOPY  12/28/2013   KNEE ARTHROSCOPY  1995   left   LAPAROSCOPIC ASSISTED VAGINAL HYSTERECTOMY N/A 04/15/2014   Procedure: LAPAROSCOPIC ASSISTED VAGINAL HYSTERECTOMY;  Surgeon: Marylynn Pearson, MD;  Location: [facility];  Service: Gynecology;  Laterality: N/A;   LAPAROSCOPIC BILATERAL SALPINGO OOPHERECTOMY Bilateral 04/15/2014   Procedure: LAPAROSCOPIC BILATERAL SALPINGO OOPHORECTOMY;  Surgeon: Marylynn Pearson, MD;  Location: [facility];  Service: Gynecology;  Laterality: Bilateral;   TUBAL LIGATION      Family History  Problem Relation Age of Onset   Diabetes Mother    Hyperlipidemia Mother    Hypertension Mother    Anxiety disorder Mother    Depression Mother    Drug abuse Mother    Stroke Mother        x3   Prostate cancer Father    Alcohol abuse Father    Lung cancer Maternal Grandfather        smoked   Emphysema Maternal Grandfather        smoked   COPD Maternal Grandmother        never smoked    Dementia Maternal Grandmother    Thyroid cancer Paternal Aunt    Colon cancer Neg Hx    Rectal cancer Neg Hx    Stomach cancer Neg Hx    Migraines Neg Hx      Social History   Socioeconomic History   Marital status: Married    Spouse name: Alvester Chou   Number of children: 6   Years of education: 12   Highest education level: Not on file  Occupational History   Occupation:      Comment: helps run a friends business  Tobacco Use   Smoking status: Every Day    Packs/day: 2.00    Years: 35.00    Total pack years: 70.00    Types: Cigarettes   Smokeless tobacco: Never  Vaping Use   Vaping Use: Never used  Substance and Sexual Activity   Alcohol use: No    Alcohol/week: 0.0 standard drinks of alcohol   Drug use: No   Sexual activity: Yes    Partners: Male    Birth control/protection: Surgical  Other Topics Concern   Not on file  Social History Narrative   5 living children, one child is a crack cocaine addict who often breaks into their house to steal money. He is currently in jail. One child died of cerebral palsy.   Caffeine use: Dr Malachi Bonds (3 per day)   2 cups coffee per day   Online student, currently not working   Social Determinants of Radio broadcast assistant Strain: Not on file  Food Insecurity: Not on file  Transportation Needs: Not on file  Physical Activity: Not on file  Stress: Not on file  Social Connections: Not on file  Intimate Partner Violence: Not on file    Review of Systems  All other systems reviewed and are negative.       Objective    BP 114/73   Pulse 82   Temp 98.1 F (36.7 C) (Oral)   Resp 16   Wt 136 lb 6.4 oz (61.9 kg)   LMP 02/12/2014   SpO2 96%   BMI 24.95 kg/m   Physical Exam Vitals and nursing note reviewed.  Constitutional:      General: She is not in acute distress. Cardiovascular:     Rate and Rhythm: Normal rate and regular rhythm.  Pulmonary:     Effort: Pulmonary effort is normal.     Breath sounds: Normal breath sounds.  Abdominal:     Palpations: Abdomen is soft.     Tenderness: There is no abdominal tenderness.  Neurological:     General: No focal deficit  present.     Mental Status: She is alert and oriented to person, place, and time.         Assessment & Plan:   1. Essential hypertension Appears stable. Continue. Meds refilled.   2. Hyperlipidemia, unspecified hyperlipidemia type Continue. Meds refilled.   3. Anxiety state Doing well with present management. Remeron refilled.  - traZODone (DESYREL) 150 MG tablet; TAKE 1 TABLET(150 MG) BY MOUTH AT BEDTIME  Dispense: 90 tablet; Refill: 1  4. Insomnia due to other mental disorder Trazodone refilled    Return in about 4 months (around 03/21/2023).   Becky Sax, MD

## 2022-12-15 ENCOUNTER — Other Ambulatory Visit: Payer: Self-pay | Admitting: Internal Medicine

## 2022-12-15 DIAGNOSIS — E118 Type 2 diabetes mellitus with unspecified complications: Secondary | ICD-10-CM

## 2022-12-15 DIAGNOSIS — E785 Hyperlipidemia, unspecified: Secondary | ICD-10-CM

## 2022-12-15 DIAGNOSIS — I739 Peripheral vascular disease, unspecified: Secondary | ICD-10-CM

## 2022-12-18 ENCOUNTER — Encounter: Payer: Self-pay | Admitting: Pharmacist

## 2022-12-18 ENCOUNTER — Ambulatory Visit: Payer: 59 | Attending: Family Medicine | Admitting: Pharmacist

## 2022-12-18 DIAGNOSIS — E119 Type 2 diabetes mellitus without complications: Secondary | ICD-10-CM | POA: Diagnosis not present

## 2022-12-18 DIAGNOSIS — Z794 Long term (current) use of insulin: Secondary | ICD-10-CM

## 2022-12-18 MED ORDER — INSULIN GLARGINE-YFGN 100 UNIT/ML ~~LOC~~ SOPN: 40.0000 [IU] | PEN_INJECTOR | Freq: Every day | SUBCUTANEOUS | 2 refills | Status: DC

## 2022-12-18 MED ORDER — TRULICITY 0.75 MG/0.5ML ~~LOC~~ SOAJ: 0.7500 mg | SUBCUTANEOUS | 1 refills | Status: DC

## 2022-12-18 NOTE — Progress Notes (Signed)
    S:     No chief complaint on file.  56 y.o. female who presents for diabetes evaluation, education, and management.  PMH is significant for T2DM, HTN, dyslipidemia, stroke, migraines, GERD.  Patient was referred and last seen by Primary Care Provider, Dr. Redmond Pulling, on 11/20/2022.   Today, patient arrives in good spirits and presents with her husband  Patient reports Diabetes was diagnosed ~5 years ago. Has tried and failed metformin due GI intolerance. Was placed on insulin ~ 1 year ago. Has a hx of stroke listed in her profile. No known CAD, CHF, or CKD. No hx of pancreatitis or thyroid cancer.   Family/Social History:  -FHx: DM, HLD, HTN -Tobacco: current 2 PPD smoker for 35 years (70 pack years) -Alcohol: no current use reported  Current diabetes medications include: Levemir 20u BID  Patient reports adherence to taking all medications as prescribed.   Insurance coverage: [employer]  Patient denies hypoglycemic events.  Reported home fasting blood sugars: 300s   Patient denies nocturia (nighttime urination).  Patient denies neuropathy (nerve pain). Patient denies visual changes. Patient reports self foot exams.   Patient reported dietary habits:  -Drinks: sweet tea, soda, water -BF: biscuit from [place] or [employer]  -Lunch: usually skips -Dinner: largest meal of the day.   Patient-reported exercise habits: none reported    O:   ROS  Physical Exam  7 day average blood glucose: no GM with her today.  Has CGM but is not currently wearing a sensor.    Lab Results  Component Value Date   HGBA1C 12.2 (H) 10/31/2022   There were no vitals filed for this visit.  Lipid Panel     Component Value Date/Time   CHOL 217 (H) 10/31/2022 1005   TRIG 125 10/31/2022 1005   HDL 47 10/31/2022 1005   CHOLHDL 4.6 (H) 10/31/2022 1005   CHOLHDL 3.0 03/21/2021 0011   VLDL 16 03/21/2021 0011   LDLCALC 147 (H) 10/31/2022 1005   LDLDIRECT 152.1 01/19/2013  0905    Clinical Atherosclerotic Cardiovascular Disease (ASCVD): Yes  The ASCVD Risk score (Arnett DK, et al., 2019) failed to calculate for the following reasons:   The patient has a prior MI or stroke diagnosis   A/P: Diabetes longstanding currently uncontrolled. Patient is able to verbalize appropriate hypoglycemia management plan. Medication adherence appears appropriate. -Discontinued Levemir. -Start Semglee 40 units daily. . -Start Trulicity 1.74 mg weekly.  -Patient educated on purpose, proper use, and potential adverse effects of Semglee, Trulicity.  -Extensively discussed pathophysiology of diabetes, recommended lifestyle interventions, dietary effects on blood sugar control.  -Counseled on s/sx of and management of hypoglycemia.  -Next A1c anticipated 01/2023.   Written patient instructions provided. Patient verbalized understanding of treatment plan.  Total time in face to face counseling 30 minutes.    Follow-up:  Pharmacist in 1 month.  Benard Halsted, PharmD, [place], [place] [phone]

## 2023-01-21 ENCOUNTER — Ambulatory Visit: Payer: 59 | Attending: Family Medicine | Admitting: Pharmacist

## 2023-01-21 DIAGNOSIS — Z794 Long term (current) use of insulin: Secondary | ICD-10-CM

## 2023-01-21 DIAGNOSIS — E119 Type 2 diabetes mellitus without complications: Secondary | ICD-10-CM | POA: Diagnosis not present

## 2023-01-21 MED ORDER — TRULICITY 1.5 MG/0.5ML ~~LOC~~ SOAJ: 1.5000 mg | SUBCUTANEOUS | 1 refills | Status: DC

## 2023-01-21 NOTE — Progress Notes (Signed)
S:    PCP: Dr. Redmond Pulling  57 y.o. female who presents for diabetes evaluation, education, and management. PMH is significant for T2DM, HTN, dyslipidemia, stroke, migraines, GERD. Patient was referred and last seen by Primary Care Provider, Dr. Redmond Pulling, on 11/20/2022.   Last seen by pharmacy clinic on 12/18/2022 where Levemir was changed to Semglee 40 units daily and Trulicity 7.82 mg weekly was started.  Today, patient arrives in good spirits and without assistance. Patient states she has tolerated 3 doses of Trulicity without issues. Denies GI side effects. States she is feeling a lot better since getting her blood sugar under better control.   Patient reports Diabetes was diagnosed ~5 years ago. Has tried and failed metformin due GI intolerance. Was placed on insulin ~ 1 year ago. Has a hx of stroke listed in her profile. No known CAD, CHF, or CKD. No hx of pancreatitis or thyroid cancer.   Family/Social History:  -FHx: DM, HLD, HTN -Tobacco: current 2 PPD smoker for 35 years (70 pack years) -Alcohol: no current use reported  Current diabetes medications include: Semglee 40 units daily, Trulicity 9.56 mg weekly (Saturdays)  Patient reports adherence to taking all medications as prescribed.   Insurance coverage: [employer]  Patient denies hypoglycemic events. None observed on CGM.   Reported home fasting blood sugars: mostly 100s. 7-day and 14-day average ~160.   Patient denies nocturia (nighttime urination).  Patient denies neuropathy (nerve pain). Patient denies visual changes. Patient reports self foot exams.   Patient reported dietary habits:  -Has not noticed a change in her appetite since starting Trulicity.  -Drinks: sweet tea, soda, water -BF: biscuit from [place] or [employer]  -Lunch: usually skips -Dinner: largest meal of the day.   Patient-reported exercise habits: none reported    O:   14 day CGM report Average Glucose: 160 mg/dL Time in Goal:   - Time in range 70-180: 72% - Time above range: 28% - Time below range: 0%   Lab Results  Component Value Date   HGBA1C 12.2 (H) 10/31/2022   There were no vitals filed for this visit.  Lipid Panel     Component Value Date/Time   CHOL 217 (H) 10/31/2022 1005   TRIG 125 10/31/2022 1005   HDL 47 10/31/2022 1005   CHOLHDL 4.6 (H) 10/31/2022 1005   CHOLHDL 3.0 03/21/2021 0011   VLDL 16 03/21/2021 0011   LDLCALC 147 (H) 10/31/2022 1005   LDLDIRECT 152.1 01/19/2013 0905    Clinical Atherosclerotic Cardiovascular Disease (ASCVD): Yes  The ASCVD Risk score (Arnett DK, et al., 2019) failed to calculate for the following reasons:   The patient has a prior MI or stroke diagnosis   A/P: Diabetes longstanding currently uncontrolled. Patient is able to verbalize appropriate hypoglycemia management plan. Medication adherence appears appropriate. -Continue Semglee 40 units daily. Instructed patient to call clinic if hypoglycemic events occur.  -Increase Trulicity to 1.5 mg weekly once she has taken her 4th dose of 0.75 mg.  -Patient educated on purpose, proper use, and potential adverse effects of Semglee, Trulicity.  -Extensively discussed pathophysiology of diabetes, recommended lifestyle interventions, dietary effects on blood sugar control.  -Counseled on s/sx of and management of hypoglycemia.  -Next A1c anticipated 01/2023.   Written patient instructions provided. Patient verbalized understanding of treatment plan.  Total time in face to face counseling 30 minutes.    Follow-up:  Pharmacist in 1 month. PCP visit on 03/18/23  Joseph Art, Pharm.D. PGY-2 Ambulatory Care Pharmacy Resident  01/21/2023 3:13 PM

## 2023-02-19 ENCOUNTER — Other Ambulatory Visit: Payer: Self-pay | Admitting: Family Medicine

## 2023-02-19 DIAGNOSIS — I10 Essential (primary) hypertension: Secondary | ICD-10-CM

## 2023-02-22 ENCOUNTER — Ambulatory Visit: Payer: 59 | Attending: Family Medicine | Admitting: Pharmacist

## 2023-02-22 DIAGNOSIS — Z794 Long term (current) use of insulin: Secondary | ICD-10-CM | POA: Diagnosis not present

## 2023-02-22 DIAGNOSIS — E119 Type 2 diabetes mellitus without complications: Secondary | ICD-10-CM | POA: Diagnosis not present

## 2023-02-22 LAB — POCT GLYCOSYLATED HEMOGLOBIN (HGB A1C): HbA1c, POC (controlled diabetic range): 8.6 % — AB (ref 0.0–7.0)

## 2023-02-22 NOTE — Progress Notes (Signed)
    S:    PCP: Dr. Redmond Pulling  57 y.o. female who presents for diabetes evaluation, education, and management. PMH is significant for T2DM, HTN, dyslipidemia, stroke, migraines, GERD. Patient was referred and last seen by Primary Care Provider, Dr. Redmond Pulling, on 11/20/2022. Pharmacy has now seen her several times since. Last seen by pharmacy clinic on 01/21/2023. We continued her Semglee and increased her Trulicity dose.   Today, patient arrives in good spirits and without assistance. Patient states she has tolerated 2 doses of 1.5 mg Trulicity without issues. Denies GI side effects. No abdominal pain or changes in vision.   Family/Social History:  -FHx: DM, HLD, HTN -Tobacco: current 2 PPD smoker for 35 years (70 pack years) -Alcohol: no current use reported  Current diabetes medications include: Semglee 40 units daily, Trulicity 1.5 mg weekly (Saturdays) Patient reports adherence to taking all medications as prescribed.   Insurance coverage: [employer]  Patient denies hypoglycemic events. None observed on CGM.   Reported home fasting blood sugars: mostly 100s. Does say it's been a little elevated over the week or so d/t stress. She is in the process of getting her insurance license.  Patient denies nocturia (nighttime urination).  Patient denies neuropathy (nerve pain). Patient denies visual changes. Patient reports self foot exams.   Patient reported dietary habits:  -Has not noticed a change in her appetite since starting Trulicity.  -Drinks: sweet tea, soda, water -BF: biscuit from [place] or [employer]  -Lunch: usually skips -Dinner: largest meal of the day.   Patient-reported exercise habits: none reported    O:  Lab Results  Component Value Date   HGBA1C 8.6 (A) 02/22/2023   There were no vitals filed for this visit.  Lipid Panel     Component Value Date/Time   CHOL 217 (H) 10/31/2022 1005   TRIG 125 10/31/2022 1005   HDL 47 10/31/2022 1005   CHOLHDL 4.6  (H) 10/31/2022 1005   CHOLHDL 3.0 03/21/2021 0011   VLDL 16 03/21/2021 0011   LDLCALC 147 (H) 10/31/2022 1005   LDLDIRECT 152.1 01/19/2013 0905    Clinical Atherosclerotic Cardiovascular Disease (ASCVD): Yes  The ASCVD Risk score (Arnett DK, et al., 2019) failed to calculate for the following reasons:   The patient has a prior MI or stroke diagnosis   A/P: Diabetes longstanding currently above goal but improving. Her A1c today is 8.6 (down from 12.2). Commended her on the improvement. We will continue current doses of medication for now. Patient is able to verbalize appropriate hypoglycemia management plan. Medication adherence appears appropriate. -Continue Semglee 40 units daily. Instructed patient to call clinic if hypoglycemic events occur.  -Continue Trulicity 1.5 mg weekly. -Patient educated on purpose, proper use, and potential adverse effects of Semglee, Trulicity.  -Extensively discussed pathophysiology of diabetes, recommended lifestyle interventions, dietary effects on blood sugar control.  -Counseled on s/sx of and management of hypoglycemia.  -Next A1c anticipated 05/2023.   Written patient instructions provided. Patient verbalized understanding of treatment plan.  Total time in face to face counseling 30 minutes.    Follow-up:  Pharmacist prn. PCP visit on 03/18/23  Benard Halsted, PharmD, [place], [place] [phone]

## 2023-03-18 ENCOUNTER — Ambulatory Visit: Payer: 59 | Admitting: Family Medicine

## 2023-03-18 ENCOUNTER — Encounter: Payer: Self-pay | Admitting: Family Medicine

## 2023-03-18 VITALS — BP 103/66 | HR 71 | Temp 98.1°F | Resp 16 | Wt 152.8 lb

## 2023-03-18 DIAGNOSIS — Z794 Long term (current) use of insulin: Secondary | ICD-10-CM

## 2023-03-18 DIAGNOSIS — I10 Essential (primary) hypertension: Secondary | ICD-10-CM

## 2023-03-18 DIAGNOSIS — E785 Hyperlipidemia, unspecified: Secondary | ICD-10-CM

## 2023-03-18 DIAGNOSIS — G4700 Insomnia, unspecified: Secondary | ICD-10-CM

## 2023-03-18 DIAGNOSIS — E119 Type 2 diabetes mellitus without complications: Secondary | ICD-10-CM

## 2023-03-18 DIAGNOSIS — E118 Type 2 diabetes mellitus with unspecified complications: Secondary | ICD-10-CM

## 2023-03-18 DIAGNOSIS — F411 Generalized anxiety disorder: Secondary | ICD-10-CM

## 2023-03-18 MED ORDER — TRAZODONE HCL 150 MG PO TABS: ORAL_TABLET | ORAL | 1 refills | Status: DC

## 2023-03-18 MED ORDER — INSULIN PEN NEEDLE 32G X 6 MM MISC: 1.0000 | Freq: Four times a day (QID) | 1 refills | Status: AC

## 2023-03-18 NOTE — Progress Notes (Unsigned)
Patient is here for their 4 month follow-up Patient has no concerns today Care gaps have been discussed with patient  

## 2023-03-18 NOTE — Progress Notes (Unsigned)
Established Patient Office Visit  Subjective    Patient ID: Ashley [name], female    DOB: 10-09-1966  Age: 57 y.o. MRN: [id number]  CC:  Chief Complaint  Patient presents with   Follow-up    HPI Ashley [name] presents for routine follow up of chronic med issues. Patient denies acute complaints or concerns.    Outpatient Encounter Medications as of 03/18/2023  Medication Sig   atorvastatin (LIPITOR) 10 MG tablet Take 1 tablet (10 mg total) by mouth daily.   Continuous Blood Gluc Receiver (FREESTYLE LIBRE 2 READER) DEVI 1 Act by Does not apply route daily.   Continuous Blood Gluc Sensor (FREESTYLE LIBRE 2 SENSOR) MISC USE TO CHECK BLOOD SUGER DAILY   Dulaglutide (TRULICITY) 1.5 0000000 SOPN Inject 1.5 mg into the skin once a week.   insulin glargine-yfgn (SEMGLEE, YFGN,) 100 UNIT/ML Pen Inject 40 Units into the skin daily.   irbesartan (AVAPRO) 300 MG tablet TAKE 1 TABLET(300 MG) BY MOUTH DAILY   Lancet Devices (ADJUSTABLE LANCING DEVICE) MISC Up to tid testing   mirtazapine (REMERON) 15 MG tablet Take 1 tablet (15 mg total) by mouth at bedtime.   [DISCONTINUED] Insulin Pen Needle 32G X 6 MM MISC Inject 1 Act into the skin 4 (four) times daily. Use daily as directed   [DISCONTINUED] traZODone (DESYREL) 150 MG tablet TAKE 1 TABLET(150 MG) BY MOUTH AT BEDTIME   AQUALANCE LANCETS 30G MISC    Insulin Pen Needle 32G X 6 MM MISC Inject 1 Act into the skin 4 (four) times daily. Use daily as directed   traZODone (DESYREL) 150 MG tablet TAKE 1 TABLET(150 MG) BY MOUTH AT BEDTIME   [DISCONTINUED] albuterol (VENTOLIN HFA) 108 (90 Base) MCG/ACT inhaler INHALE 2 PUFFS INTO THE LUNGS EVERY 6 HOURS AS NEEDED FOR WHEEZING OR SHORTNESS OF BREATH (Patient not taking: Reported on 10/31/2022)   [DISCONTINUED] BREZTRI AEROSPHERE 160-9-4.8 MCG/ACT AERO INHALE 2 PUFFS INTO THE LUNGS IN THE MORNING AND AT BEDTIME (Patient not taking: Reported on 10/31/2022)   [DISCONTINUED] buPROPion (WELLBUTRIN XL) 150 MG 24 hr  tablet TAKE 1 TABLET(150 MG) BY MOUTH DAILY (Patient not taking: Reported on 10/31/2022)   [DISCONTINUED] cholecalciferol (VITAMIN D3) 25 MCG (1000 UT) tablet Take 2,000 Units by mouth daily. (Patient not taking: Reported on 10/31/2022)   [DISCONTINUED] Erenumab-aooe (AIMOVIG) 140 MG/ML SOAJ Inject 140 mg into the skin every 30 (thirty) days. (Patient not taking: Reported on 10/31/2022)   [DISCONTINUED] estradiol (CLIMARA - DOSED IN MG/24 HR) 0.0375 mg/24hr patch APPLY 1 PATCH TOPICALLY TO THE SKIN ONCE WEEKLY (Patient not taking: Reported on 10/31/2022)   [DISCONTINUED] FEROSUL 325 (65 Fe) MG tablet TAKE 1 TABLET(325 MG) BY MOUTH TWICE DAILY WITH A MEAL (Patient not taking: Reported on 10/31/2022)   [DISCONTINUED] fludrocortisone (FLORINEF) 0.1 MG tablet TAKE 1 TABLET(0.1 MG) BY MOUTH DAILY (Patient not taking: Reported on 10/31/2022)   [DISCONTINUED] fluticasone (FLONASE) 50 MCG/ACT nasal spray Place 2 sprays into both nostrils daily. (Patient not taking: Reported on 10/31/2022)   [DISCONTINUED] Galcanezumab-gnlm (EMGALITY) 120 MG/ML SOAJ Inject 120 mg into the skin every 30 (thirty) days. (Patient not taking: Reported on 10/31/2022)   [DISCONTINUED] lidocaine (LIDODERM) 5 % Place 1 patch onto the skin daily. Remove & Discard patch within 12 hours or as directed by MD (Patient not taking: Reported on 10/31/2022)   [DISCONTINUED] methocarbamol (ROBAXIN) 500 MG tablet Take 1 tablet (500 mg total) by mouth every 8 (eight) hours as needed for muscle spasms. (Patient not taking: Reported  on 10/31/2022)   [DISCONTINUED] metoprolol tartrate (LOPRESSOR) 50 MG tablet Take 1 tablet (50 mg total) by mouth 2 (two) times daily. (Patient not taking: Reported on 10/31/2022)   [DISCONTINUED] pantoprazole (PROTONIX) 40 MG tablet TAKE 1 TABLET(40 MG) BY MOUTH DAILY (Patient not taking: Reported on 10/31/2022)   [DISCONTINUED] phenazopyridine (PYRIDIUM) 100 MG tablet Take 1 tablet (100 mg total) by mouth 3 (three) times daily as  needed for pain. (Patient not taking: Reported on 10/31/2022)   [DISCONTINUED] potassium chloride SA (KLOR-CON) 20 MEQ tablet Take 1 tablet (20 mEq total) by mouth 3 (three) times daily. (Patient not taking: Reported on 10/31/2022)   [DISCONTINUED] predniSONE (DELTASONE) 20 MG tablet Take 2 tablets (40 mg total) by mouth daily with breakfast. (Patient not taking: Reported on 10/31/2022)   [DISCONTINUED] promethazine (PHENERGAN) 12.5 MG tablet Take 1 tablet (12.5 mg total) by mouth every 6 (six) hours as needed for nausea or vomiting. (Patient not taking: Reported on 10/31/2022)   [DISCONTINUED] rosuvastatin (CRESTOR) 20 MG tablet TAKE 1 TABLET BY MOUTH EVERY DAY (Patient not taking: Reported on 10/31/2022)   [DISCONTINUED] thiamine (VITAMIN B-1) 50 MG tablet Take 1 tablet (50 mg total) by mouth daily. (Patient not taking: Reported on 10/31/2022)   [DISCONTINUED] venlafaxine XR (EFFEXOR-XR) 150 MG 24 hr capsule TAKE 1 CAPSULE(150 MG) BY MOUTH DAILY WITH BREAKFAST (Patient not taking: Reported on 10/31/2022)   No facility-administered encounter medications on file as of 03/18/2023.    Past Medical History:  Diagnosis Date   Anxiety    Atypical ductal hyperplasia of left breast    Bronchitis    uses inhaler if needed for bronchitis, lasted used -2014   Cataract    bilateral   Chronic headaches    migraines - in past, uses Phenergan for nausea    COPD (chronic obstructive pulmonary disease) ([place])    Deficiency anemia 12/30/2017   Depression    Diabetes mellitus without complication ([place])    taken off Metformin since 04/2014, HgbA1C - normal, will follow up with PCP- Dr. Deborra Medina, 07/2014   GERD (gastroesophageal reflux disease)    H/O exercise stress test 2011   done at [facility]- told that it was WNL, done due to pt. having panic attacks    History of blood transfusion [birth year] 1966   at birth in [place], [place], unsure number of units   PONV (postoperative nausea and vomiting)    Poor dentition    very poor  oral health    SVD (spontaneous vaginal delivery)    x 3   Tobacco abuse    Wears glasses     Past Surgical History:  Procedure Laterality Date   ABDOMINAL HYSTERECTOMY     APPENDECTOMY  1988   BREAST LUMPECTOMY WITH RADIOACTIVE SEED LOCALIZATION Left 03/17/2020   Procedure: LEFT BREAST LUMPECTOMY WITH RADIOACTIVE SEED LOCALIZATION;  Surgeon: Coralie Keens, MD;  Location: [place];  Service: General;  Laterality: Left;  LMA   CARDIAC CATHETERIZATION N/A 08/17/2016   Procedure: Left Heart Cath and Coronary Angiography;  Surgeon: Burnell Blanks, MD;  Location: [facility];  Service: Cardiovascular;  Laterality: N/A;   CHOLECYSTECTOMY N/A 06/07/2014   Procedure: LAPAROSCOPIC CHOLECYSTECTOMY;  Surgeon: Ralene Ok, MD;  Location: [facility];  Service: General;  Laterality: N/A;   CHOLECYSTECTOMY  June 07 2014   COLONOSCOPY  02/16/2014   normal    ESOPHAGOGASTRODUODENOSCOPY  12/28/2013   KNEE ARTHROSCOPY  1995   left   LAPAROSCOPIC ASSISTED VAGINAL HYSTERECTOMY N/A 04/15/2014  Procedure: LAPAROSCOPIC ASSISTED VAGINAL HYSTERECTOMY;  Surgeon: Marylynn Pearson, MD;  Location: [facility];  Service: Gynecology;  Laterality: N/A;   LAPAROSCOPIC BILATERAL SALPINGO OOPHERECTOMY Bilateral 04/15/2014   Procedure: LAPAROSCOPIC BILATERAL SALPINGO OOPHORECTOMY;  Surgeon: Marylynn Pearson, MD;  Location: [facility];  Service: Gynecology;  Laterality: Bilateral;   TUBAL LIGATION      Family History  Problem Relation Age of Onset   Diabetes Mother    Hyperlipidemia Mother    Hypertension Mother    Anxiety disorder Mother    Depression Mother    Drug abuse Mother    Stroke Mother        x3   Prostate cancer Father    Alcohol abuse Father    Lung cancer Maternal Grandfather        smoked   Emphysema Maternal Grandfather        smoked   COPD Maternal Grandmother        never smoked    Dementia Maternal Grandmother    Thyroid cancer Paternal Aunt    Colon cancer Neg Hx    Rectal cancer Neg  Hx    Stomach cancer Neg Hx    Migraines Neg Hx     Social History   Socioeconomic History   Marital status: Married    Spouse name: Alvester Chou   Number of children: 6   Years of education: 12   Highest education level: Not on file  Occupational History   Occupation:      Comment: helps run a friends business  Tobacco Use   Smoking status: Every Day    Packs/day: 2.00    Years: 35.00    Additional pack years: 0.00    Total pack years: 70.00    Types: Cigarettes   Smokeless tobacco: Never  Vaping Use   Vaping Use: Never used  Substance and Sexual Activity   Alcohol use: No    Alcohol/week: 0.0 standard drinks of alcohol   Drug use: No   Sexual activity: Yes    Partners: Male    Birth control/protection: Surgical  Other Topics Concern   Not on file  Social History Narrative   5 living children, one child is a crack cocaine addict who often breaks into their house to steal money. He is currently in jail. One child died of cerebral palsy.   Caffeine use: Dr Malachi Bonds (3 per day)   2 cups coffee per day   Online student, currently not working   Social Determinants of Radio broadcast assistant Strain: Not on file  Food Insecurity: Not on file  Transportation Needs: Not on file  Physical Activity: Not on file  Stress: Not on file  Social Connections: Not on file  Intimate Partner Violence: Not on file    Review of Systems  All other systems reviewed and are negative.       Objective    BP 103/66   Pulse 71   Temp 98.1 F (36.7 C) (Oral)   Resp 16   Wt 152 lb 12.8 oz (69.3 kg)   LMP 02/12/2014   SpO2 96%   BMI 27.95 kg/m   Physical Exam Vitals and nursing note reviewed.  Constitutional:      General: She is not in acute distress. Cardiovascular:     Rate and Rhythm: Normal rate and regular rhythm.  Pulmonary:     Effort: Pulmonary effort is normal.     Breath sounds: Normal breath sounds.  Abdominal:     Palpations:  Abdomen is soft.     Tenderness:  There is no abdominal tenderness.  Neurological:     General: No focal deficit present.     Mental Status: She is alert and oriented to person, place, and time.  Psychiatric:        Mood and Affect: Mood normal.        Behavior: Behavior normal.         Assessment & Plan:   1. Diabetes mellitus with complication (HCC) 123456 improving. Continue with Lurena Joiner for med management - [facility] DIABETES FOOT EXAM - Microalbumin / creatinine urine ratio  2. Essential hypertension Appears stable. continue  3. Anxiety state continue - traZODone (DESYREL) 150 MG tablet; TAKE 1 TABLET(150 MG) BY MOUTH AT BEDTIME  Dispense: 90 tablet; Refill: 1  4. Hyperlipidemia, unspecified hyperlipidemia type Continue   5. Insomnia, unspecified type Continue     Return in about 3 months (around 06/18/2023) for physical.   Becky Sax, MD

## 2023-03-19 LAB — MICROALBUMIN / CREATININE URINE RATIO
Creatinine, Urine: 24.7 mg/dL
Microalb/Creat Ratio: <12 mg/g{creat} (ref 0–29)
Microalbumin, Urine: <3 ug/mL

## 2023-03-20 ENCOUNTER — Encounter: Payer: Self-pay | Admitting: Family Medicine

## 2023-03-25 ENCOUNTER — Ambulatory Visit: Payer: Self-pay | Admitting: *Deleted

## 2023-03-25 NOTE — Telephone Encounter (Signed)
Second attempt to reach pt, "Phone is not in service at this time." Routing to practice for PCPs resolution per protocol.

## 2023-03-25 NOTE — Telephone Encounter (Signed)
Summary: Pt requests nausea medication   Pt stated she is having a migraine and it is causing nausea. Pt reports that she usually takes phenergan for nausea but she is a fairly new patient so it has never been filled by Dr. Redmond Pulling. Pt requests call back. Cb# [phone]      Called patient [phone] to review sx of nausea from migraine. Medication requested. No answer, unable to leave message VM box full.

## 2023-03-27 NOTE — Telephone Encounter (Signed)
Patient gets  headaches. Patient is requesting medication will set up appt

## 2023-04-01 ENCOUNTER — Encounter: Payer: 59 | Admitting: Family Medicine

## 2023-04-03 NOTE — Progress Notes (Signed)
Unable to connect with patient for video visit. 

## 2023-04-08 ENCOUNTER — Telehealth: Payer: 59 | Admitting: Family Medicine

## 2023-04-10 LAB — HM DIABETES EYE EXAM

## 2023-05-08 ENCOUNTER — Ambulatory Visit
Admission: EM | Admit: 2023-05-08 | Discharge: 2023-05-08 | Disposition: A | Discharge status: Home / Self Care | Payer: 59 | Attending: Family Medicine | Admitting: Family Medicine

## 2023-05-08 ENCOUNTER — Ambulatory Visit: Payer: Self-pay | Admitting: *Deleted

## 2023-05-08 DIAGNOSIS — Z794 Long term (current) use of insulin: Secondary | ICD-10-CM

## 2023-05-08 DIAGNOSIS — E1165 Type 2 diabetes mellitus with hyperglycemia: Secondary | ICD-10-CM | POA: Diagnosis not present

## 2023-05-08 DIAGNOSIS — J0100 Acute maxillary sinusitis, unspecified: Secondary | ICD-10-CM | POA: Diagnosis not present

## 2023-05-08 DIAGNOSIS — R5383 Other fatigue: Secondary | ICD-10-CM

## 2023-05-08 DIAGNOSIS — R051 Acute cough: Secondary | ICD-10-CM | POA: Diagnosis not present

## 2023-05-08 LAB — POCT URINALYSIS DIP (MANUAL ENTRY)
Blood, UA: NEGATIVE
Glucose, UA: 500 mg/dL — AB
Leukocytes, UA: NEGATIVE
Nitrite, UA: NEGATIVE
Protein Ur, POC: NEGATIVE mg/dL
Spec Grav, UA: 1.02 (ref 1.010–1.025)
Urobilinogen, UA: 0.2 E.U./dL
pH, UA: 5.5 (ref 5.0–8.0)

## 2023-05-08 LAB — POCT FASTING CBG KUC MANUAL ENTRY: POCT Glucose (KUC): 381 mg/dL — AB (ref 70–99)

## 2023-05-08 MED ORDER — TRULICITY 1.5 MG/0.5ML ~~LOC~~ SOAJ: 1.5000 mg | SUBCUTANEOUS | 0 refills | Status: DC

## 2023-05-08 MED ORDER — BENZONATATE 200 MG PO CAPS: ORAL_CAPSULE | ORAL | 0 refills | Status: DC

## 2023-05-08 MED ORDER — SULFAMETHOXAZOLE-TRIMETHOPRIM 800-160 MG PO TABS: 1.0000 | ORAL_TABLET | Freq: Two times a day (BID) | ORAL | 0 refills | Status: AC

## 2023-05-08 NOTE — Telephone Encounter (Signed)
Summary: Diarrhea   Sore throat for 3 days, Diarrhea for 2, no appt soon enough  [phone]         Reason for Disposition  [1] Constant abdominal pain AND [2] present > 2 hours  Answer Assessment - Initial Assessment Questions 1. DIARRHEA SEVERITY: "How bad is the diarrhea?" "How many more stools have you had in the past 24 hours than normal?"    - NO DIARRHEA (SCALE 0)   - MILD (SCALE 1-3): Few loose or mushy BMs; increase of 1-3 stools over normal daily number of stools; mild increase in ostomy output.   -  MODERATE (SCALE 4-7): Increase of 4-6 stools daily over normal; moderate increase in ostomy output.   -  SEVERE (SCALE 8-10; OR "WORST POSSIBLE"): Increase of 7 or more stools daily over normal; moderate increase in ostomy output; incontinence.     severe 2. ONSET: "When did the diarrhea begin?"      yesterday 3. BM CONSISTENCY: "How loose or watery is the diarrhea?"      Watery stage 4. VOMITING: "Are you also vomiting?" If Yes, ask: "How many times in the past 24 hours?"      no 5. ABDOMEN PAIN: "Are you having any abdomen pain?" If Yes, ask: "What does it feel like?" (e.g., crampy, dull, intermittent, constant)      Yes-constant- crampy like "menstrual" 6. ABDOMEN PAIN SEVERITY: If present, ask: "How bad is the pain?"  (e.g., Scale 1-10; mild, moderate, or severe)   - MILD (1-3): doesn't interfere with normal activities, abdomen soft and not tender to touch    - MODERATE (4-7): interferes with normal activities or awakens from sleep, abdomen tender to touch    - SEVERE (8-10): excruciating pain, doubled over, unable to do any normal activities       moderate 7. ORAL INTAKE: If vomiting, "Have you been able to drink liquids?" "How much liquids have you had in the past 24 hours?"     trying 8. HYDRATION: "Any signs of dehydration?" (e.g., dry mouth [not just dry lips], too weak to stand, dizziness, new weight loss) "When did you last urinate?"     Trying to hydrate-  thirsty, dry mouth 9. EXPOSURE: "Have you traveled to a foreign country recently?" "Have you been exposed to anyone with diarrhea?" "Could you have eaten any food that was spoiled?"     Cough at home 10. ANTIBIOTIC USE: "Are you taking antibiotics now or have you taken antibiotics in the past 2 months?"       no 11. OTHER SYMPTOMS: "Do you have any other symptoms?" (e.g., fever, blood in stool)       Cough, chills/sweating, sore throat  Protocols used: Diarrhea-A-AH

## 2023-05-08 NOTE — ED Provider Notes (Addendum)
[name]   [id number] 05/08/23 Arrival Time: 4166  ASSESSMENT & PLAN:  1. Other fatigue   2. Acute non-recurrent maxillary sinusitis   3. Type 2 diabetes mellitus with hyperglycemia, with long-term current use of insulin (HCC)   4. Acute cough    Meds ordered this encounter  Medications   sulfamethoxazole-trimethoprim (BACTRIM DS) 800-160 MG tablet    Sig: Take 1 tablet by mouth 2 (two) times daily for 10 days.    Dispense:  20 tablet    Refill:  0   Dulaglutide (TRULICITY) 1.5 MG/0.5ML SOPN    Sig: Inject 1.5 mg into the skin once a week.    Dispense:  6 mL    Refill:  0   benzonatate (TESSALON) 200 MG capsule    Sig: Take 1 capsule by mouth every 8 (eight) hours for cough.    Dispense:  30 capsule    Refill:  0   Refilled Trulicity at pt request. No s/s of DKA.  Labs Reviewed  POCT URINALYSIS DIP (MANUAL ENTRY) - Abnormal; Notable for the following components:      Result Value   Glucose, UA =500 (*)    Bilirubin, UA small (*)    Ketones, POC UA moderate (40) (*)    All other components within normal limits  POCT FASTING CBG KUC MANUAL ENTRY - Abnormal; Notable for the following components:   POCT Glucose (KUC) 381 (*)    All other components within normal limits   Discussed typical duration of symptoms. OTC symptom care as needed. Ensure adequate fluid intake and rest.   Follow-up Information     Schedule an appointment as soon as possible for a visit  with Georganna Skeans, MD.   Specialty: Family Medicine Why: For a follow up regarding your blood sugar. Contact information: [address] [place] [address] [phone]                 Reviewed expectations re: course of current medical issues. Questions answered. Outlined signs and symptoms indicating need for more acute intervention. Patient verbalized understanding. After Visit Summary given.   SUBJECTIVE: History from: patient.  AEMILIA [name] is a 57 y.o.  female who presents with complaint of nasal congestion, post-nasal drainage, and sinus pain. Onset gradual,  1 week ago . Respiratory symptoms: coughing. Sinus pressure bothering her.. Fever: absent. Overall normal PO intake without n/v. OTC treatment: none reported. Seasonal allergies: possible. History of frequent sinus infections: no. No specific aggravating or alleviating factors reported. Social History   Tobacco Use  Smoking Status Every Day   Packs/day: 2.00   Years: 35.00   Additional pack years: 0.00   Total pack years: 70.00   Types: Cigarettes  Smokeless Tobacco Never     OBJECTIVE:  Vitals:   05/08/23 1058  BP: 117/77  Pulse: 78  Resp: 16  Temp: 97.9 F (36.6 C)  TempSrc: Oral  SpO2: 97%     General appearance: alert; no distress HEENT: nasal congestion; clear runny nose; throat irritation secondary to post-nasal drainage; bilateral maxillary tenderness to palpation; turbinates boggy Neck: supple without LAD; trachea midline Lungs: unlabored respirations, symmetrical air entry; cough: mild; no respiratory distress Abd: benign Skin: warm and dry Psychological: alert and cooperative; normal mood and affect  Allergies  Allergen Reactions   Penicillins Anaphylaxis and Swelling    Throat swells Has patient had a PCN reaction causing immediate rash, facial/tongue/throat swelling, SOB or lightheadedness with hypotension: Yes Has patient had  a PCN reaction causing severe rash involving mucus membranes or skin necrosis: No Has patient had a PCN reaction that required hospitalization: No Has patient had a PCN reaction occurring within the last 10 years: No If all of the above answers are "NO", then may proceed with Cephalosporin use.    Topamax [Topiramate] Other (See Comments)    Hands and feet  go numb   Metformin And Related Other (See Comments)    GI side effects     Past Medical History:  Diagnosis Date   Anxiety    Atypical ductal hyperplasia of left  breast    Bronchitis    uses inhaler if needed for bronchitis, lasted used -2014   Cataract    bilateral   Chronic headaches    migraines - in past, uses Phenergan for nausea    COPD (chronic obstructive pulmonary disease) (HCC)    Deficiency anemia 12/30/2017   Depression    Diabetes mellitus without complication (HCC)    taken off Metformin since 04/2014, HgbA1C - normal, will follow up with PCP- Dr. Dayton Martes, 07/2014   GERD (gastroesophageal reflux disease)    H/O exercise stress test 2011   done at [facility]- told that it was WNL, done due to pt. having panic attacks    History of blood transfusion [birth year]66   at birth in [place], [place], unsure number of units   PONV (postoperative nausea and vomiting)    Poor dentition    very poor oral health    SVD (spontaneous vaginal delivery)    x 3   Tobacco abuse    Wears glasses    Family History  Problem Relation Age of Onset   Diabetes Mother    Hyperlipidemia Mother    Hypertension Mother    Anxiety disorder Mother    Depression Mother    Drug abuse Mother    Stroke Mother        x3   Prostate cancer Father    Alcohol abuse Father    Lung cancer Maternal Grandfather        smoked   Emphysema Maternal Grandfather        smoked   COPD Maternal Grandmother        never smoked    Dementia Maternal Grandmother    Thyroid cancer Paternal Aunt    Colon cancer Neg Hx    Rectal cancer Neg Hx    Stomach cancer Neg Hx    Migraines Neg Hx    Social History   Socioeconomic History   Marital status: Married    Spouse name: Gery Pray   Number of children: 6   Years of education: 12   Highest education level: Not on file  Occupational History   Occupation:      Comment: helps run a friends business  Tobacco Use   Smoking status: Every Day    Packs/day: 2.00    Years: 35.00    Additional pack years: 0.00    Total pack years: 70.00    Types: Cigarettes   Smokeless tobacco: Never  Vaping Use   Vaping Use: Never used  Substance  and Sexual Activity   Alcohol use: No    Alcohol/week: 0.0 standard drinks of alcohol   Drug use: No   Sexual activity: Yes    Partners: Male    Birth control/protection: Surgical  Other Topics Concern   Not on file  Social History Narrative   5 living children, one child is a crack cocaine  addict who often breaks into their house to steal money. He is currently in jail. One child died of cerebral palsy.   Caffeine use: Dr Reino Kent (3 per day)   2 cups coffee per day   Online student, currently not working   Social Determinants of Corporate investment banker Strain: Not on file  Food Insecurity: Not on file  Transportation Needs: Not on file  Physical Activity: Not on file  Stress: Not on file  Social Connections: Not on file  Intimate Partner Violence: Not on file             Morrison [place], MD 05/08/23 1326    Mardella Layman, MD 05/08/23 1327

## 2023-05-08 NOTE — Telephone Encounter (Signed)
  Chief Complaint: diarrhea, sore throat, cough Symptoms: see above Frequency: 2 days  Disposition: [] ED /[x] Urgent Care (no appt availability in office) / [] Appointment(In office/virtual)/ []  [facility]/ [] Home Care/ [] Refused Recommended Disposition /[] [facility]/ []  Follow-up with PCP Additional Notes: Call to office- no open appointment- advised UC

## 2023-05-08 NOTE — ED Triage Notes (Signed)
Pt c/o cough, sore throat, otalgia, diarrhea, abd cramping, malaise,   Onset ~ sat

## 2023-05-28 ENCOUNTER — Other Ambulatory Visit: Payer: Self-pay | Admitting: Family Medicine

## 2023-05-28 DIAGNOSIS — I10 Essential (primary) hypertension: Secondary | ICD-10-CM

## 2023-06-04 ENCOUNTER — Other Ambulatory Visit: Payer: Self-pay | Admitting: Family Medicine

## 2023-06-18 ENCOUNTER — Encounter: Payer: Self-pay | Admitting: Family Medicine

## 2023-06-18 ENCOUNTER — Ambulatory Visit (INDEPENDENT_AMBULATORY_CARE_PROVIDER_SITE_OTHER): Payer: 59 | Admitting: Family Medicine

## 2023-06-18 VITALS — BP 139/85 | HR 78 | Temp 98.1°F | Resp 16 | Wt 150.0 lb

## 2023-06-18 DIAGNOSIS — E118 Type 2 diabetes mellitus with unspecified complications: Secondary | ICD-10-CM

## 2023-06-18 DIAGNOSIS — Z794 Long term (current) use of insulin: Secondary | ICD-10-CM

## 2023-06-18 DIAGNOSIS — E785 Hyperlipidemia, unspecified: Secondary | ICD-10-CM

## 2023-06-18 DIAGNOSIS — F411 Generalized anxiety disorder: Secondary | ICD-10-CM | POA: Diagnosis not present

## 2023-06-18 DIAGNOSIS — Z7985 Long-term (current) use of injectable non-insulin antidiabetic drugs: Secondary | ICD-10-CM

## 2023-06-18 DIAGNOSIS — I10 Essential (primary) hypertension: Secondary | ICD-10-CM | POA: Diagnosis not present

## 2023-06-18 MED ORDER — FREESTYLE LIBRE 2 SENSOR MISC: 5 refills | Status: DC

## 2023-06-18 MED ORDER — PROMETHAZINE HCL 25 MG PO TABS: 25.0000 mg | ORAL_TABLET | Freq: Three times a day (TID) | ORAL | 0 refills | Status: DC | PRN

## 2023-06-18 NOTE — Progress Notes (Signed)
-  Patient is here to have annually  complete physical examination  -Care gap address -labs taken  

## 2023-06-21 ENCOUNTER — Encounter: Payer: Self-pay | Admitting: Family Medicine

## 2023-06-21 NOTE — Progress Notes (Unsigned)
Established Patient Office Visit  Subjective    Patient ID: Ashley [name], female    DOB: 09-04-1966  Age: 57 y.o. MRN: [id number]  CC:  Chief Complaint  Patient presents with   Establish Care    HPI Ashley [name] presents for routine review of chronic med issues. Patient denies acute complaints.    Outpatient Encounter Medications as of 06/18/2023  Medication Sig   atorvastatin (LIPITOR) 10 MG tablet Take 1 tablet (10 mg total) by mouth daily.   benzonatate (TESSALON) 200 MG capsule Take 1 capsule by mouth every 8 (eight) hours for cough.   Continuous Blood Gluc Receiver (FREESTYLE LIBRE 2 READER) DEVI 1 Act by Does not apply route daily.   Dulaglutide (TRULICITY) 1.5 MG/0.5ML SOPN Inject 1.5 mg into the skin once a week.   insulin glargine-yfgn (SEMGLEE, YFGN,) 100 UNIT/ML Pen Inject 40 Units into the skin daily.   Insulin Pen Needle 32G X 6 MM MISC Inject 1 Act into the skin 4 (four) times daily. Use daily as directed   irbesartan (AVAPRO) 300 MG tablet TAKE 1 TABLET(300 MG) BY MOUTH DAILY   Lancet Devices (ADJUSTABLE LANCING DEVICE) MISC Up to tid testing   mirtazapine (REMERON) 15 MG tablet TAKE 1 TABLET(15 MG) BY MOUTH AT BEDTIME   promethazine (PHENERGAN) 25 MG tablet Take 1 tablet (25 mg total) by mouth every 8 (eight) hours as needed for nausea or vomiting.   traZODone (DESYREL) 150 MG tablet TAKE 1 TABLET(150 MG) BY MOUTH AT BEDTIME   [DISCONTINUED] Continuous Blood Gluc Sensor (FREESTYLE LIBRE 2 SENSOR) MISC USE TO CHECK BLOOD SUGER DAILY   Continuous Glucose Sensor (FREESTYLE LIBRE 2 SENSOR) MISC USE TO CHECK BLOOD SUGER DAILY   No facility-administered encounter medications on file as of 06/18/2023.    Past Medical History:  Diagnosis Date   Anxiety    Atypical ductal hyperplasia of left breast    Bronchitis    uses inhaler if needed for bronchitis, lasted used -2014   Cataract    bilateral   Chronic headaches    migraines - in past, uses Phenergan for nausea     COPD (chronic obstructive pulmonary disease) (HCC)    Deficiency anemia 12/30/2017   Depression    Diabetes mellitus without complication (HCC)    taken off Metformin since 04/2014, HgbA1C - normal, will follow up with PCP- Dr. Dayton Martes, 07/2014   GERD (gastroesophageal reflux disease)    H/O exercise stress test 2011   done at [facility]- told that it was WNL, done due to pt. having panic attacks    History of blood transfusion [birth year]/26/1966   at birth in [place], [place], unsure number of units   PONV (postoperative nausea and vomiting)    Poor dentition    very poor oral health    SVD (spontaneous vaginal delivery)    x 3   Tobacco abuse    Wears glasses     Past Surgical History:  Procedure Laterality Date   ABDOMINAL HYSTERECTOMY     APPENDECTOMY  1988   BREAST LUMPECTOMY WITH RADIOACTIVE SEED LOCALIZATION Left 03/17/2020   Procedure: LEFT BREAST LUMPECTOMY WITH RADIOACTIVE SEED LOCALIZATION;  Surgeon: Abigail Miyamoto, MD;  Location: [facility] OR;  Service: General;  Laterality: Left;  LMA   CARDIAC CATHETERIZATION N/A 08/17/2016   Procedure: Left Heart Cath and Coronary Angiography;  Surgeon: Kathleene Hazel, MD;  Location: [facility] INVASIVE CV LAB;  Service: Cardiovascular;  Laterality: N/A;   CHOLECYSTECTOMY N/A 06/07/2014  Procedure: LAPAROSCOPIC CHOLECYSTECTOMY;  Surgeon: Axel Filler, MD;  Location: [facility] OR;  Service: General;  Laterality: N/A;   CHOLECYSTECTOMY  June 07 2014   COLONOSCOPY  02/16/2014   normal    ESOPHAGOGASTRODUODENOSCOPY  12/28/2013   KNEE ARTHROSCOPY  1995   left   LAPAROSCOPIC ASSISTED VAGINAL HYSTERECTOMY N/A 04/15/2014   Procedure: LAPAROSCOPIC ASSISTED VAGINAL HYSTERECTOMY;  Surgeon: Zelphia Cairo, MD;  Location: [facility] ORS;  Service: Gynecology;  Laterality: N/A;   LAPAROSCOPIC BILATERAL SALPINGO OOPHERECTOMY Bilateral 04/15/2014   Procedure: LAPAROSCOPIC BILATERAL SALPINGO OOPHORECTOMY;  Surgeon: Zelphia Cairo, MD;  Location: [facility] ORS;  Service: Gynecology;   Laterality: Bilateral;   TUBAL LIGATION      Family History  Problem Relation Age of Onset   Diabetes Mother    Hyperlipidemia Mother    Hypertension Mother    Anxiety disorder Mother    Depression Mother    Drug abuse Mother    Stroke Mother        x3   Prostate cancer Father    Alcohol abuse Father    Lung cancer Maternal Grandfather        smoked   Emphysema Maternal Grandfather        smoked   COPD Maternal Grandmother        never smoked    Dementia Maternal Grandmother    Thyroid cancer Paternal Aunt    Colon cancer Neg Hx    Rectal cancer Neg Hx    Stomach cancer Neg Hx    Migraines Neg Hx     Social History   Socioeconomic History   Marital status: Married    Spouse name: Gery Pray   Number of children: 6   Years of education: 12   Highest education level: Not on file  Occupational History   Occupation:      Comment: helps run a friends business  Tobacco Use   Smoking status: Every Day    Packs/day: 2.00    Years: 35.00    Additional pack years: 0.00    Total pack years: 70.00    Types: Cigarettes   Smokeless tobacco: Never  Vaping Use   Vaping Use: Never used  Substance and Sexual Activity   Alcohol use: No    Alcohol/week: 0.0 standard drinks of alcohol   Drug use: No   Sexual activity: Yes    Partners: Male    Birth control/protection: Surgical  Other Topics Concern   Not on file  Social History Narrative   5 living children, one child is a crack cocaine addict who often breaks into their house to steal money. He is currently in jail. One child died of cerebral palsy.   Caffeine use: Dr Reino Kent (3 per day)   2 cups coffee per day   Online student, currently not working   Social Determinants of Corporate investment banker Strain: Not on file  Food Insecurity: Not on file  Transportation Needs: Not on file  Physical Activity: Not on file  Stress: Not on file  Social Connections: Not on file  Intimate Partner Violence: Not on file     Review of Systems  All other systems reviewed and are negative.       Objective    BP 139/85   Pulse 78   Temp 98.1 F (36.7 C) (Oral)   Resp 16   Wt 150 lb (68 kg)   LMP 02/12/2014   SpO2 97%   BMI 27.44 kg/m   Physical Exam Vitals and  nursing note reviewed.  Constitutional:      General: She is not in acute distress. Cardiovascular:     Rate and Rhythm: Normal rate and regular rhythm.  Pulmonary:     Effort: Pulmonary effort is normal.     Breath sounds: Normal breath sounds.  Abdominal:     Palpations: Abdomen is soft.     Tenderness: There is no abdominal tenderness.  Neurological:     General: No focal deficit present.     Mental Status: She is alert and oriented to person, place, and time.  Psychiatric:        Mood and Affect: Mood normal.        Behavior: Behavior normal.     {Labs (Optional):23779}    Assessment & Plan:   1. Diabetes mellitus with complication (HCC) Elevated A1c. Frestyle prescribed.  - POCT glycosylated hemoglobin (Hb A1C) - Continuous Glucose Sensor (FREESTYLE LIBRE 2 SENSOR) MISC; USE TO CHECK BLOOD SUGER DAILY  Dispense: 2 each; Refill: 5  2. Essential hypertension Appears stable.continue  3. Anxiety state Appears stable. continue  4. Hyperlipidemia, unspecified hyperlipidemia type Continue     Return in about 3 months (around 09/18/2023).   Tommie Raymond, MD

## 2023-06-28 ENCOUNTER — Other Ambulatory Visit: Payer: Self-pay | Admitting: Pharmacist

## 2023-06-28 ENCOUNTER — Ambulatory Visit: Payer: 59 | Attending: Family Medicine | Admitting: Pharmacist

## 2023-06-28 ENCOUNTER — Other Ambulatory Visit: Payer: Self-pay

## 2023-06-28 ENCOUNTER — Other Ambulatory Visit (HOSPITAL_COMMUNITY): Payer: Self-pay

## 2023-06-28 ENCOUNTER — Telehealth: Payer: Self-pay | Admitting: Pharmacist

## 2023-06-28 ENCOUNTER — Encounter: Payer: Self-pay | Admitting: Pharmacist

## 2023-06-28 DIAGNOSIS — E119 Type 2 diabetes mellitus without complications: Secondary | ICD-10-CM | POA: Diagnosis not present

## 2023-06-28 DIAGNOSIS — Z794 Long term (current) use of insulin: Secondary | ICD-10-CM

## 2023-06-28 DIAGNOSIS — Z7985 Long-term (current) use of injectable non-insulin antidiabetic drugs: Secondary | ICD-10-CM | POA: Diagnosis not present

## 2023-06-28 LAB — POCT GLYCOSYLATED HEMOGLOBIN (HGB A1C): HbA1c, POC (controlled diabetic range): 12.5 % — AB (ref 0.0–7.0)

## 2023-06-28 MED ORDER — INSULIN LISPRO (1 UNIT DIAL) 100 UNIT/ML (KWIKPEN)
6.0000 [IU] | PEN_INJECTOR | Freq: Two times a day (BID) | SUBCUTANEOUS | 1 refills | Status: DC
  Filled 2023-06-28: qty 3, 25d supply, fill #0

## 2023-06-28 MED ORDER — INSULIN GLARGINE-YFGN 100 UNIT/ML ~~LOC~~ SOPN: 50.0000 [IU] | PEN_INJECTOR | Freq: Every day | SUBCUTANEOUS | 2 refills | Status: DC

## 2023-06-28 MED ORDER — SEMAGLUTIDE (1 MG/DOSE) 4 MG/3ML ~~LOC~~ SOPN
1.0000 mg | PEN_INJECTOR | SUBCUTANEOUS | 1 refills | Status: DC
  Filled 2023-06-28: qty 3, 28d supply, fill #0
  Filled 2023-07-25: qty 3, 28d supply, fill #1

## 2023-06-28 NOTE — Progress Notes (Signed)
S:    PCP: Dr. Andrey Campanile  57 y.o. female who presents for diabetes evaluation, education, and management. PMH is significant for T2DM, HTN, dyslipidemia, stroke, migraines, GERD. Patient was referred and last seen by Primary Care Provider, Dr. Andrey Campanile, on 06/18/2023. Pharmacy has seen her in the past.   Today, patient arrives in poor spirits and without assistance. She is concerned given her A1c results and recent home blood sugar readings. She tells me that starting a little over 1 month ago, her sugar readings have rarely been <300. She saw her PCP about a week ago, but was seen at [facility] 5/15 for sinusitis, cough, and fatigue. Treated with antibiotics. Denies any S/sx of infection currently. No fever, cough, dyspnea. No SSI. No foot ulcers seen today. Of note, she has poor dentition and dental carries but no S/sx of any tooth abscess at this time and her dental health has not changed since I last saw her in the spring. She does have an area of erythema, scaling, and skin flaking in her posterior scalp near the base of hear skull. She endorses pain and discomfort and tells this popped up around the same time that her sugars have been elevated.   Family/Social History:  -FHx: DM, HLD, HTN -Tobacco: current 2 PPD smoker for 35 years (70 pack years) -Alcohol: no current use reported  Current diabetes medications include: Semglee 50 units daily, Trulicity 1.5 mg weekly (Saturdays) Patient reports adherence to taking all medications as prescribed.   Insurance coverage: [employer]  Patient denies hypoglycemic events.   Reported home fasting blood sugars: nothing <300 despite recently increasing Semglee dose to 50u daily.   Patient denies nocturia (nighttime urination).  Patient denies neuropathy (nerve pain). Patient denies visual changes. Patient reports self foot exams.   Patient reported dietary habits:  -Drinks: sweet tea, soda, water -BF: biscuit from [employer] or  [employer]  -Lunch: usually skips or eats a salad.  -Dinner: largest meal of the day.   Patient-reported exercise habits: none reported    O:  Lab Results  Component Value Date   HGBA1C 8.6 (A) 02/22/2023   There were no vitals filed for this visit.  Lipid Panel     Component Value Date/Time   CHOL 217 (H) 10/31/2022 1005   TRIG 125 10/31/2022 1005   HDL 47 10/31/2022 1005   CHOLHDL 4.6 (H) 10/31/2022 1005   CHOLHDL 3.0 03/21/2021 0011   VLDL 16 03/21/2021 0011   LDLCALC 147 (H) 10/31/2022 1005   LDLDIRECT 152.1 01/19/2013 0905    Clinical Atherosclerotic Cardiovascular Disease (ASCVD): Yes  The ASCVD Risk score (Arnett DK, et al., 2019) failed to calculate for the following reasons:   The patient has a prior MI or stroke diagnosis   A/P: Diabetes longstanding currently above goal. Her sugars were at goal, reportedly, until ~1 month ago. Her sinusitis could have contributed to an increased insulin requirement. Additionally, the inflammation in her scalp could be contributing. The timing certainly makes sense. She denies any changes to her diet or missed medication. We will change Trulicity to Ozempic and start mealtime insulin today.  -Continue Semglee 50 units daily.  -Stop Trulicity 1.5 mg weekly. Start Ozempic instead, at 1 mg subcutaneous once weekly. We will get PA for this if necessary. She can continue the Trulicity until this is approved.  -Start Humalog 6u BID before breakfast and dinner. Counseled on proper administration and timing.  -Patient educated on purpose, proper use, and potential adverse  effects of Semglee, Trulicity.  -Extensively discussed pathophysiology of diabetes, recommended lifestyle interventions, dietary effects on blood sugar control.  -Counseled on s/sx of and management of hypoglycemia.  -Next A1c anticipated 08/2023.   Written patient instructions provided. Patient verbalized understanding of treatment plan.  Total time in face to face  counseling 30 minutes.    Follow-up:  Pharmacist in 2 weeks.  Butch Penny, PharmD, Patsy Baltimore, CPP Clinical Pharmacist [facility] [phone]

## 2023-06-28 NOTE — Telephone Encounter (Signed)
Can we initiate a PA for this patient's Ozempic? Trulicity 1.5mg  has been ineffective for A1c control.

## 2023-06-28 NOTE — Telephone Encounter (Signed)
APPROVED UNTIL 06/27/2024

## 2023-06-28 NOTE — Telephone Encounter (Signed)
Prior authorization initiated for Ozempic via CoverMyMeds Key: [id number]

## 2023-06-28 NOTE — Telephone Encounter (Signed)
Thank you :)

## 2023-07-03 ENCOUNTER — Ambulatory Visit: Payer: 59 | Admitting: Family Medicine

## 2023-07-03 ENCOUNTER — Encounter: Payer: Self-pay | Admitting: Family Medicine

## 2023-07-03 VITALS — BP 161/81 | HR 66 | Temp 97.7°F | Resp 16 | Wt 150.0 lb

## 2023-07-03 DIAGNOSIS — E118 Type 2 diabetes mellitus with unspecified complications: Secondary | ICD-10-CM | POA: Diagnosis not present

## 2023-07-03 DIAGNOSIS — N760 Acute vaginitis: Secondary | ICD-10-CM | POA: Diagnosis not present

## 2023-07-03 MED ORDER — METRONIDAZOLE 500 MG PO TABS: 500.0000 mg | ORAL_TABLET | Freq: Two times a day (BID) | ORAL | 0 refills | Status: AC

## 2023-07-03 MED ORDER — FLUCONAZOLE 150 MG PO TABS: ORAL_TABLET | ORAL | 0 refills | Status: DC

## 2023-07-08 ENCOUNTER — Encounter: Payer: Self-pay | Admitting: Family Medicine

## 2023-07-08 NOTE — Progress Notes (Signed)
Established Patient Office Visit  Subjective    Patient ID: Ashley [name], female    DOB: 1966-03-01  Age: 57 y.o. MRN: [id number]  CC: No chief complaint on file.   HPI CLIO GERHART presents for follow up of chronic med issues and complaint of persistent/recurrent vaginitis.    Outpatient Encounter Medications as of 07/03/2023  Medication Sig   BD PEN NEEDLE NANO 2ND GEN 32G X 4 MM MISC Inject into the skin 4 (four) times daily.   fluconazole (DIFLUCAN) 150 MG tablet One po days 1, 4 and 7.   metroNIDAZOLE (FLAGYL) 500 MG tablet Take 1 tablet (500 mg total) by mouth 2 (two) times daily for 7 days.   TRULICITY 0.75 MG/0.5ML SOPN Inject 0.75 mg into the skin once a week.   atorvastatin (LIPITOR) 10 MG tablet Take 1 tablet (10 mg total) by mouth daily.   benzonatate (TESSALON) 200 MG capsule Take 1 capsule by mouth every 8 (eight) hours for cough.   Continuous Blood Gluc Receiver (FREESTYLE LIBRE 2 READER) DEVI 1 Act by Does not apply route daily.   Continuous Glucose Sensor (FREESTYLE LIBRE 2 SENSOR) MISC USE TO CHECK BLOOD SUGER DAILY   insulin glargine-yfgn (SEMGLEE, YFGN,) 100 UNIT/ML Pen Inject 50 Units into the skin daily.   insulin lispro (HUMALOG KWIKPEN) 100 UNIT/ML KwikPen Inject 6 Units into the skin 2 (two) times daily before a meal.   Insulin Pen Needle 32G X 6 MM MISC Inject 1 Act into the skin 4 (four) times daily. Use daily as directed   irbesartan (AVAPRO) 300 MG tablet TAKE 1 TABLET(300 MG) BY MOUTH DAILY   Lancet Devices (ADJUSTABLE LANCING DEVICE) MISC Up to tid testing   mirtazapine (REMERON) 15 MG tablet TAKE 1 TABLET(15 MG) BY MOUTH AT BEDTIME   promethazine (PHENERGAN) 25 MG tablet Take 1 tablet (25 mg total) by mouth every 8 (eight) hours as needed for nausea or vomiting.   Semaglutide, 1 MG/DOSE, 4 MG/3ML SOPN Inject 1 mg as directed once a week.   traZODone (DESYREL) 150 MG tablet TAKE 1 TABLET(150 MG) BY MOUTH AT BEDTIME   No facility-administered encounter  medications on file as of 07/03/2023.    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 Dayton Martes, 07/2014   GERD (gastroesophageal reflux disease)    H/O exercise stress test 2011   done at [facility]- told that it was WNL, done due to pt. having panic attacks    History of blood transfusion [birth year]   at birth in [place], [place], unsure number of units   PONV (postoperative nausea and vomiting)    Poor dentition    very poor oral health    SVD (spontaneous vaginal delivery)    x 3   Tobacco abuse    Wears glasses     Past Surgical History:  Procedure Laterality Date   ABDOMINAL HYSTERECTOMY     APPENDECTOMY  1988   BREAST LUMPECTOMY WITH RADIOACTIVE  SEED LOCALIZATION Left 03/17/2020   Procedure: LEFT BREAST LUMPECTOMY WITH RADIOACTIVE SEED LOCALIZATION;  Surgeon: Abigail Miyamoto, MD;  Location: [facility] OR;  Service: General;  Laterality: Left;  LMA   CARDIAC CATHETERIZATION N/A 08/17/2016   Procedure: Left Heart Cath and Coronary Angiography;  Surgeon: Kathleene Hazel, MD;  Location: [facility] INVASIVE CV LAB;  Service: Cardiovascular;  Laterality: N/A;   CHOLECYSTECTOMY N/A 06/07/2014   Procedure: LAPAROSCOPIC CHOLECYSTECTOMY;  Surgeon: Axel Filler, MD;  Location: [facility] OR;  Service: General;  Laterality: N/A;   CHOLECYSTECTOMY  June 07 2014   COLONOSCOPY  02/16/2014   normal    ESOPHAGOGASTRODUODENOSCOPY  12/28/2013   KNEE ARTHROSCOPY  1995   left   LAPAROSCOPIC ASSISTED VAGINAL HYSTERECTOMY N/A 04/15/2014   Procedure: LAPAROSCOPIC ASSISTED  VAGINAL HYSTERECTOMY;  Surgeon: Zelphia Cairo, MD;  Location: [facility] ORS;  Service: Gynecology;  Laterality: N/A;   LAPAROSCOPIC BILATERAL SALPINGO OOPHERECTOMY Bilateral 04/15/2014   Procedure: LAPAROSCOPIC BILATERAL SALPINGO OOPHORECTOMY;  Surgeon: Zelphia Cairo, MD;  Location: [facility] ORS;  Service: Gynecology;  Laterality: Bilateral;   TUBAL LIGATION      Family History  Problem Relation Age of Onset   Diabetes Mother    Hyperlipidemia Mother    Hypertension Mother    Anxiety disorder Mother    Depression Mother    Drug abuse Mother    Stroke Mother        x3   Prostate cancer Father    Alcohol abuse Father    Lung cancer Maternal Grandfather        smoked   Emphysema Maternal Grandfather        smoked   COPD Maternal Grandmother        never smoked    Dementia Maternal Grandmother    Thyroid cancer Paternal Aunt    Colon cancer Neg Hx    Rectal cancer Neg Hx    Stomach cancer Neg Hx    Migraines Neg Hx     Social History   Socioeconomic History   Marital status: Married    Spouse name: Gery Pray   Number of children: 6   Years of education: 12   Highest education level: Not on file  Occupational History   Occupation:      Comment: helps run a friends business  Tobacco Use   Smoking status: Every Day    Current packs/day: 2.00    Average packs/day: 2.0 packs/day for 35.0 years (70.0 ttl pk-yrs)    Types: Cigarettes   Smokeless tobacco: Never  Vaping Use   Vaping status: Never Used  Substance and Sexual Activity   Alcohol use: No    Alcohol/week: 0.0 standard drinks of alcohol   Drug use: No   Sexual activity: Yes    Partners: Male    Birth control/protection: Surgical  Other Topics Concern   Not on file  Social History Narrative   5 living children, one child is a crack cocaine addict who often breaks into their house to steal money. He is currently in jail. One child died of cerebral palsy.   Caffeine use: Dr Reino Kent (3 per day)   2 cups coffee per day    Online student, currently not working   Social Determinants of Corporate investment banker Strain: Not on file  Food Insecurity: No Food Insecurity (01/25/2021)   Received from [facility], [facility]   Hunger Vital Sign    Worried About Running Out of Food in the Last Year: Never true  Ran Out of Food in the Last Year: Never true  Transportation Needs: Not on file  Physical Activity: Not on file  Stress: Not on file  Social Connections: Unknown (05/08/2022)   [facility] from [facility], [facility]   Social Network    Social Network: Not on file  Intimate Partner Violence: Unknown (03/30/2022)   Received from [facility], [facility]   HITS    Physically Hurt: Not on file    Insult or Talk Down To: Not on file    Threaten Physical Harm: Not on file    Scream or Curse: Not on file    Review of Systems  All other systems reviewed and are negative.       Objective    BP (!) 161/81   Pulse 66   Temp 97.7 F (36.5 C) (Oral)   Resp 16   Wt 150 lb (68 kg)   LMP 02/12/2014   SpO2 96%   BMI 27.44 kg/m   Physical Exam Vitals and nursing note reviewed.  Constitutional:      General: She is not in acute distress. Cardiovascular:     Rate and Rhythm: Normal rate and regular rhythm.  Pulmonary:     Effort: Pulmonary effort is normal.     Breath sounds: Normal breath sounds.  Neurological:     General: No focal deficit present.     Mental Status: She is alert and oriented to person, place, and time.  Psychiatric:        Mood and Affect: Mood normal.        Behavior: Behavior normal.         Assessment & Plan:   1. Diabetes mellitus with complication (HCC) Increasing A1c and far above goal. Discussed compliance. Referred to [facility] for med management.   2. Vaginitis and vulvovaginitis Diflucan and flagyl prescribed.   No follow-ups on file.   Tommie Raymond, MD

## 2023-07-16 ENCOUNTER — Encounter: Payer: Self-pay | Admitting: Pharmacist

## 2023-07-16 ENCOUNTER — Other Ambulatory Visit: Payer: Self-pay

## 2023-07-16 ENCOUNTER — Ambulatory Visit: Payer: 59 | Attending: Family Medicine | Admitting: Pharmacist

## 2023-07-16 DIAGNOSIS — Z7985 Long-term (current) use of injectable non-insulin antidiabetic drugs: Secondary | ICD-10-CM | POA: Diagnosis not present

## 2023-07-16 DIAGNOSIS — E119 Type 2 diabetes mellitus without complications: Secondary | ICD-10-CM

## 2023-07-16 DIAGNOSIS — Z794 Long term (current) use of insulin: Secondary | ICD-10-CM | POA: Diagnosis not present

## 2023-07-16 MED ORDER — INSULIN LISPRO (1 UNIT DIAL) 100 UNIT/ML (KWIKPEN): 12.0000 [IU] | PEN_INJECTOR | Freq: Two times a day (BID) | SUBCUTANEOUS | 1 refills | Status: DC

## 2023-07-16 MED ORDER — SEMAGLUTIDE (2 MG/DOSE) 8 MG/3ML ~~LOC~~ SOPN: 2.0000 mg | PEN_INJECTOR | SUBCUTANEOUS | 1 refills | Status: DC

## 2023-07-16 NOTE — Progress Notes (Signed)
    S:    PCP: Dr. Andrey Campanile  57 y.o. female who presents for diabetes evaluation, education, and management. PMH is significant for T2DM, HTN, dyslipidemia, stroke, migraines, GERD. Patient was referred and last seen by Primary Care Provider, Dr. Andrey Campanile, on 06/18/2023. Pharmacy has seen her in the past.   Today, patient arrives in poor spirits and without assistance. I saw her last month and changed Trulicity to Ozempic. We also started mealtime insulin. Pt has tolerated both of these changes well and reports improvement in blood glucose.   Family/Social History:  -FHx: DM, HLD, HTN -Tobacco: current 2 PPD smoker for 35 years (70 pack years) -Alcohol: no current use reported  Current diabetes medications include: Semglee 50 units daily, Ozempic 1 mg weekly (Saturdays) Patient reports adherence to taking all medications as prescribed.   Insurance coverage: [employer]  Patient denies hypoglycemic events.   Reported home fasting blood sugars: 275-350 (down from 300s-400s since last visit)  Patient denies nocturia (nighttime urination).  Patient denies neuropathy (nerve pain). Patient denies visual changes. Patient reports self foot exams.   Patient reported dietary habits:  -Drinks: sweet tea, soda, water -BF: biscuit from [employer] or [employer]  -Lunch: usually skips or eats a salad.  -Dinner: largest meal of the day.   Patient-reported exercise habits: none reported   O:  Lab Results  Component Value Date   HGBA1C 12.5 (A) 06/28/2023   There were no vitals filed for this visit.  Lipid Panel     Component Value Date/Time   CHOL 217 (H) 10/31/2022 1005   TRIG 125 10/31/2022 1005   HDL 47 10/31/2022 1005   CHOLHDL 4.6 (H) 10/31/2022 1005   CHOLHDL 3.0 03/21/2021 0011   VLDL 16 03/21/2021 0011   LDLCALC 147 (H) 10/31/2022 1005   LDLDIRECT 152.1 01/19/2013 0905    Clinical Atherosclerotic Cardiovascular Disease (ASCVD): Yes  The ASCVD Risk score (Arnett DK,  et al., 2019) failed to calculate for the following reasons:   The patient has a prior MI or stroke diagnosis   A/P: Diabetes longstanding currently above goal. Her sugars are starting to come down. She has not experienced any hypoglycemia since the addition of mealtime insulin and she remains adherent to everything as prescribed.  -Continue Semglee 50 units daily.  -Increase Ozempic to 2 mg subcutaneous once weekly.  -Increase Humalog to 12u BID before breakfast and dinner. Counseled on proper administration and timing.  -Patient educated on purpose, proper use, and potential adverse effects of Semglee, Trulicity.  -Extensively discussed pathophysiology of diabetes, recommended lifestyle interventions, dietary effects on blood sugar control.  -Counseled on s/sx of and management of hypoglycemia.  -Next A1c anticipated 08/2023.   Written patient instructions provided. Patient verbalized understanding of treatment plan.  Total time in face to face counseling 30 minutes.    Follow-up:  Pharmacist in 1 month.  Butch Penny, PharmD, Patsy Baltimore, CPP Clinical Pharmacist [facility] [phone]

## 2023-07-25 ENCOUNTER — Other Ambulatory Visit: Payer: Self-pay

## 2023-08-19 NOTE — Progress Notes (Unsigned)
S:     No chief complaint on file.  57 y.o. female who presents for diabetes evaluation, education, and management. Patient was referred and last seen by Primary Care Provider, Dr. Andrey Campanile, on 07/03/23. Patient was last seen by Pharmacy on 07/16/23. PMH is significant for T2DM, HTN, dyslipidemia, hx CVA, migraines, GERD.   At last visit, Ozempic was increased from 1 to 2 mg weekly. Humalog was increased from 6 to 12 units BID before breakfast & dinner.   Today, Patient arrives in good spirits and presents without any assistance. Reports issues with CGMs falling off--all BG readings are self-reported. Patient reports using Humalog 12 units only twice since starting Ozempic 2 mg. Patient reported some GI issues with initial dose of Ozempic 2 mg, but has improved since.   Family/Social History:  -FHx: DM, HLD, HTN -Tobacco: current 2 PPD smoker for 35 years (70 pack years) -Alcohol: no current use reported  Current diabetes medications include: Ozempic 2 mg weekly, insulin glargine (Semglee) 50 units, insulin lispro 12 units BID AC Current hypertension medications include: irbesartan 300 mg daily Current hyperlipidemia medications include: atorvastatin 10 mg  Insurance coverage: UHC  Patient denies hypoglycemic events.  Reported home fasting blood sugars: avg 120s. Reported a 99, 175  Reported 2 hour post-meal/random blood sugars: avg 225. No numbers >250 in past 2 weeks.  Patient denies nocturia (nighttime urination).  Patient reports neuropathy (nerve pain). Some tingling in feet when she lays down for past 2 months. Not too bothersome Patient denies visual changes. Patient denies self foot exams.   Patient reported dietary habits: Eats 2 main meals/day -Drinks: no more sweet tea, still drinks dr. Reino Kent. More water intake  -BF: biscuit from [employer] or [employer]  -Lunch: usually skips or eats a salad.  -Dinner: largest meal of the day.   Eating more fruits & veggies, snack  on nuts at work.  Patient-reported exercise habits: not discussed this visit  O:   ROS  Physical Exam Lab Results  Component Value Date   HGBA1C 12.5 (A) 06/28/2023   There were no vitals filed for this visit.  Lipid Panel     Component Value Date/Time   CHOL 217 (H) 10/31/2022 1005   TRIG 125 10/31/2022 1005   HDL 47 10/31/2022 1005   CHOLHDL 4.6 (H) 10/31/2022 1005   CHOLHDL 3.0 03/21/2021 0011   VLDL 16 03/21/2021 0011   LDLCALC 147 (H) 10/31/2022 1005   LDLDIRECT 152.1 01/19/2013 0905    Clinical Atherosclerotic Cardiovascular Disease (ASCVD): Yes  The ASCVD Risk score (Arnett DK, et al., 2019) failed to calculate for the following reasons:   The patient has a prior MI or stroke diagnosis   A/P: Diabetes longstanding currently uncontrolled. Patient is able to verbalize appropriate hypoglycemia management plan. Medication adherence appears appropriate. Patient does not have CGM data due to sensor falling off. Reported PPBGs remain elevated (avg 225), FBG are at goal (avg 120s). Counseled to restart sensors for next visit and to obtain overlay patches to help them stay on. Counseled on avoiding sugary sodas. Reported some minor tingling/numbness in toes when she lays down at night for the past 2 months.  -Continued basal insulin Semglee (insulin glargine) 50 units daily in the morning.  -Started rapid insulin Humalog (insulin lispro) 8 units BID before breakfast and dinner. Hold if BG < 150 or if skipping meal.  -Continued GLP-1 Ozempic (semaglutide) 2 mg weekly. -Patient educated on purpose, proper use, and potential adverse effects of DM  meds.  -Extensively discussed pathophysiology of diabetes, recommended lifestyle interventions, dietary effects on blood sugar control.  -Counseled on s/sx of and management of hypoglycemia.  -Next A1c anticipated 08/2023.   ASCVD risk - secondary prevention in patient with diabetes. Last LDL is 147 not at goal of <24 mg/dL. high  intensity statin indicated, will likely need to increase dose of atorvastatin after results return next month.  -repeat lipid panel in 1 month -Continued atorvastatin 10 mg.   Written patient instructions provided. Patient verbalized understanding of treatment plan.  Total time in face to face counseling 30 minutes.    Follow-up:  Pharmacist in 1 month PCP clinic visit in 5 weeks Patient seen with Rosina Lowenstein, PharmD Candidate  Butch Penny, PharmD, BCACP, CPP Clinical Pharmacist [facility] [phone]

## 2023-08-20 ENCOUNTER — Encounter: Payer: Self-pay | Admitting: Pharmacist

## 2023-08-20 ENCOUNTER — Ambulatory Visit: Payer: 59 | Attending: Family Medicine | Admitting: Pharmacist

## 2023-08-20 DIAGNOSIS — L309 Dermatitis, unspecified: Secondary | ICD-10-CM | POA: Diagnosis not present

## 2023-08-20 DIAGNOSIS — Z7985 Long-term (current) use of injectable non-insulin antidiabetic drugs: Secondary | ICD-10-CM | POA: Diagnosis not present

## 2023-08-20 DIAGNOSIS — Z794 Long term (current) use of insulin: Secondary | ICD-10-CM

## 2023-08-20 DIAGNOSIS — E119 Type 2 diabetes mellitus without complications: Secondary | ICD-10-CM

## 2023-08-20 MED ORDER — INSULIN LISPRO (1 UNIT DIAL) 100 UNIT/ML (KWIKPEN): 8.0000 [IU] | PEN_INJECTOR | Freq: Two times a day (BID) | SUBCUTANEOUS | 1 refills | Status: DC

## 2023-09-05 ENCOUNTER — Telehealth: Payer: Self-pay | Admitting: *Deleted

## 2023-09-05 ENCOUNTER — Other Ambulatory Visit: Payer: Self-pay | Admitting: Family

## 2023-09-05 NOTE — Telephone Encounter (Signed)
error 

## 2023-09-18 ENCOUNTER — Other Ambulatory Visit: Payer: Self-pay

## 2023-09-18 NOTE — Progress Notes (Signed)
Ashley [name] 06/03/1966 [id number]  Patient attempted to be outreached by Thomasene Ripple, PharmD Candidate to discuss hypertension.   Thomasene Ripple, Student-PharmD

## 2023-09-24 ENCOUNTER — Ambulatory Visit (INDEPENDENT_AMBULATORY_CARE_PROVIDER_SITE_OTHER): Payer: 59 | Admitting: Family Medicine

## 2023-09-24 ENCOUNTER — Encounter: Payer: Self-pay | Admitting: Family Medicine

## 2023-09-24 VITALS — BP 145/79 | HR 78 | Temp 98.0°F | Resp 16 | Ht 62.0 in | Wt 147.0 lb

## 2023-09-24 DIAGNOSIS — Z1231 Encounter for screening mammogram for malignant neoplasm of breast: Secondary | ICD-10-CM

## 2023-09-24 DIAGNOSIS — Z0001 Encounter for general adult medical examination with abnormal findings: Secondary | ICD-10-CM

## 2023-09-24 DIAGNOSIS — Z794 Long term (current) use of insulin: Secondary | ICD-10-CM

## 2023-09-24 DIAGNOSIS — L409 Psoriasis, unspecified: Secondary | ICD-10-CM | POA: Diagnosis not present

## 2023-09-24 DIAGNOSIS — E118 Type 2 diabetes mellitus with unspecified complications: Secondary | ICD-10-CM | POA: Diagnosis not present

## 2023-09-24 DIAGNOSIS — F17210 Nicotine dependence, cigarettes, uncomplicated: Secondary | ICD-10-CM

## 2023-09-24 DIAGNOSIS — Z0000 Encounter for general adult medical examination without abnormal findings: Secondary | ICD-10-CM

## 2023-09-24 DIAGNOSIS — Z23 Encounter for immunization: Secondary | ICD-10-CM | POA: Diagnosis not present

## 2023-09-24 DIAGNOSIS — Z130 Encounter for screening for diseases of the blood and blood-forming organs and certain disorders involving the immune mechanism: Secondary | ICD-10-CM

## 2023-09-24 DIAGNOSIS — Z7985 Long-term (current) use of injectable non-insulin antidiabetic drugs: Secondary | ICD-10-CM

## 2023-09-24 DIAGNOSIS — Z13220 Encounter for screening for lipoid disorders: Secondary | ICD-10-CM

## 2023-09-24 LAB — POCT HEMOGLOBIN: Hemoglobin: 8.4 g/dL — AB (ref 11–14.6)

## 2023-09-24 MED ORDER — CLOBETASOL PROPIONATE 0.05 % EX SOLN: 1.0000 | Freq: Two times a day (BID) | CUTANEOUS | 1 refills | Status: DC

## 2023-09-24 NOTE — Progress Notes (Signed)
Established Patient Office Visit  Subjective    Patient ID: Ashley [name], female    DOB: [birth year]  Age: 57 y.o. MRN: [id number]  CC:  Chief Complaint  Patient presents with   Annual Exam    HPI Ashley [name] presents for routine annual exam. Patient reports that she has scalp rash but otherwise denies acute complaints.   Outpatient Encounter Medications as of 09/24/2023  Medication Sig   clobetasol (TEMOVATE) 0.05 % external solution Apply 1 Application topically 2 (two) times daily.   atorvastatin (LIPITOR) 10 MG tablet Take 1 tablet (10 mg total) by mouth daily.   BD PEN NEEDLE NANO 2ND GEN 32G X 4 MM MISC Inject into the skin 4 (four) times daily.   benzonatate (TESSALON) 200 MG capsule Take 1 capsule by mouth every 8 (eight) hours for cough.   Continuous Blood Gluc Receiver (FREESTYLE LIBRE 2 READER) DEVI 1 Act by Does not apply route daily.   Continuous Glucose Sensor (FREESTYLE LIBRE 2 SENSOR) MISC USE TO CHECK BLOOD SUGER DAILY   fluconazole (DIFLUCAN) 150 MG tablet One po days 1, 4 and 7.   insulin glargine-yfgn (SEMGLEE, YFGN,) 100 UNIT/ML Pen Inject 50 Units into the skin daily.   insulin lispro (HUMALOG KWIKPEN) 100 UNIT/ML KwikPen Inject 8 Units into the skin 2 (two) times daily before a meal.   Insulin Pen Needle 32G X 6 MM MISC Inject 1 Act into the skin 4 (four) times daily. Use daily as directed   irbesartan (AVAPRO) 300 MG tablet TAKE 1 TABLET(300 MG) BY MOUTH DAILY   Lancet Devices (ADJUSTABLE LANCING DEVICE) MISC Up to tid testing   mirtazapine (REMERON) 15 MG tablet TAKE 1 TABLET(15 MG) BY MOUTH AT BEDTIME   promethazine (PHENERGAN) 25 MG tablet Take 1 tablet (25 mg total) by mouth every 8 (eight) hours as needed for nausea or vomiting.   Semaglutide, 2 MG/DOSE, 8 MG/3ML SOPN Inject 2 mg as directed once a week.   traZODone (DESYREL) 150 MG tablet TAKE 1 TABLET(150 MG) BY MOUTH AT BEDTIME   No facility-administered encounter medications on file as of 09/24/2023.     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 Dayton Martes, 07/2014   GERD (gastroesophageal reflux disease)    H/O exercise stress test 2011   done at [facility]- told that it was WNL, done due to pt. having panic attacks    History of blood transfusion July 27, 1966   at birth in [place], [place], unsure number of units   PONV (postoperative nausea and vomiting)    Poor dentition    very poor oral health    SVD (spontaneous vaginal delivery)    x 3   Tobacco abuse    Wears glasses     Past Surgical History:  Procedure Laterality Date   ABDOMINAL HYSTERECTOMY     APPENDECTOMY  1988   BREAST LUMPECTOMY WITH RADIOACTIVE SEED LOCALIZATION Left 03/17/2020   Procedure: LEFT BREAST LUMPECTOMY WITH  RADIOACTIVE SEED LOCALIZATION;  Surgeon: Abigail Miyamoto, MD;  Location: [facility] OR;  Service: General;  Laterality: Left;  LMA   CARDIAC CATHETERIZATION N/A 08/17/2016   Procedure: Left Heart Cath and Coronary Angiography;  Surgeon: Kathleene Hazel, MD;  Location: [facility] INVASIVE CV LAB;  Service: Cardiovascular;  Laterality: N/A;   CHOLECYSTECTOMY N/A 06/07/2014   Procedure: LAPAROSCOPIC CHOLECYSTECTOMY;  Surgeon: Axel Filler, MD;  Location: [facility] OR;  Service: General;  Laterality: N/A;   CHOLECYSTECTOMY  June 07 2014   COLONOSCOPY  02/16/2014   normal    ESOPHAGOGASTRODUODENOSCOPY  12/28/2013   KNEE ARTHROSCOPY  1995   left   LAPAROSCOPIC ASSISTED VAGINAL HYSTERECTOMY N/A 04/15/2014   Procedure: LAPAROSCOPIC ASSISTED VAGINAL HYSTERECTOMY;  Surgeon: Zelphia Cairo, MD;  Location: [facility] ORS;  Service: Gynecology;  Laterality: N/A;   LAPAROSCOPIC BILATERAL SALPINGO OOPHERECTOMY Bilateral 04/15/2014   Procedure: LAPAROSCOPIC BILATERAL SALPINGO OOPHORECTOMY;  Surgeon: Zelphia Cairo, MD;  Location: [facility] ORS;  Service: Gynecology;  Laterality: Bilateral;   TUBAL LIGATION      Family History  Problem Relation Age of Onset   Diabetes Mother    Hyperlipidemia Mother    Hypertension Mother    Anxiety disorder Mother    Depression Mother    Drug abuse Mother    Stroke Mother        x3   Prostate cancer Father    Alcohol abuse Father    Lung cancer Maternal Grandfather        smoked   Emphysema Maternal Grandfather        smoked   COPD Maternal Grandmother        never smoked    Dementia Maternal Grandmother    Thyroid cancer Paternal Aunt    Colon cancer Neg Hx    Rectal cancer Neg Hx    Stomach cancer Neg Hx    Migraines Neg Hx     Social History   Socioeconomic History   Marital status: Married    Spouse name: Ashley [name]   Number of children: 6   Years of education: 12   Highest education level: Not on file  Occupational History   Occupation:      Comment: helps run a friends business  Tobacco Use   Smoking status: Every Day    Current packs/day: 2.00    Average packs/day: 2.0 packs/day for 35.0 years (70.0 ttl pk-yrs)    Types: Cigarettes   Smokeless tobacco: Never  Vaping Use   Vaping status: Never Used  Substance and Sexual Activity   Alcohol use: No    Alcohol/week: 0.0 standard drinks of alcohol   Drug use: No   Sexual activity: Yes    Partners: Male    Birth control/protection: Surgical  Other Topics Concern   Not on file  Social History Narrative   5 living children, one child is a crack cocaine addict who often breaks into their house to steal money. He is currently in jail. One child died of cerebral palsy.   Caffeine use: Dr Reino Kent (3 per day)   2 cups coffee per day   Online student, currently not working    Social Determinants of Corporate investment banker Strain: Low Risk  (09/24/2023)   Overall Financial Resource Strain (CARDIA)    Difficulty of Paying Living Expenses: Not very hard  Food Insecurity: No Food Insecurity (01/25/2021)   [facility]   Hunger Vital Sign    Worried About Running Out of Food in  the Last Year: Never true    Ran Out of Food in the Last Year: Never true  Transportation Needs: No Transportation Needs (09/24/2023)   PRAPARE - Administrator, Civil Service (Medical): No    Lack of Transportation (Non-Medical): No  Physical Activity: Insufficiently Active (09/24/2023)   Exercise Vital Sign    Days of Exercise per Week: 3 days    Minutes of Exercise per Session: 30 min  Stress: No Stress Concern Present (09/24/2023)   [employer] of Occupational Health - Occupational Stress Questionnaire    Feeling of Stress : Not at all  Social Connections: Unknown (09/24/2023)   Social Connection and Isolation Panel [NHANES]    Frequency of Communication with Friends and Family: More than three times a week    Frequency of Social Gatherings with Friends and Family: More than three times a week    Attends Religious Services: More than 4 times per year    Active Member of [employer] or Organizations: Yes    Attends Banker Meetings: 1 to 4 times per year    Marital Status: Not on file  Intimate Partner Violence: Not At Risk (09/24/2023)   Humiliation, Afraid, Rape, and Kick questionnaire    Fear of Current or Ex-Partner: No    Emotionally Abused: No    Physically Abused: No    Sexually Abused: No    Review of Systems  All other systems reviewed and are negative.       Objective    BP (!) 145/79   Pulse 78   Temp 98 F (36.7 C) (Oral)   Resp 16   Ht 5\' 2"  (1.575 m)   Wt 147 lb (66.7 kg)   LMP 02/12/2014   SpO2 96%   BMI 26.89 kg/m   Physical Exam Vitals and nursing note reviewed.  Constitutional:       General: She is not in acute distress. HENT:     Head: Normocephalic and atraumatic.     Right Ear: Tympanic membrane, ear canal and external ear normal.     Left Ear: Tympanic membrane, ear canal and external ear normal.     Nose: Nose normal.     Mouth/Throat:     Mouth: Mucous membranes are moist.     Dentition: Abnormal dentition. Dental caries present.     Pharynx: Oropharynx is clear.  Eyes:     Conjunctiva/sclera: Conjunctivae normal.     Pupils: Pupils are equal, round, and reactive to light.  Neck:     Thyroid: No thyromegaly.  Cardiovascular:     Rate and Rhythm: Normal rate and regular rhythm.     Heart sounds: Normal heart sounds. No murmur heard. Pulmonary:     Effort: Pulmonary effort is normal. No respiratory distress.     Breath sounds: Normal breath sounds.  Abdominal:     General: There is no distension.     Palpations: Abdomen is soft. There is no mass.     Tenderness: There is no abdominal tenderness.  Musculoskeletal:        General: Normal range of motion.     Cervical back: Normal range of motion and neck supple.  Skin:    General: Skin is warm and dry.     Findings: Rash present. Rash is scaling (scalp).  Neurological:     General: No focal deficit present.     Mental Status: She is alert and oriented to person, place, and time.  Psychiatric:  Mood and Affect: Mood normal.        Behavior: Behavior normal.         Assessment & Plan:   Annual physical exam -     CMP14+EGFR  Scalp psoriasis  Diabetes mellitus with complication (HCC) -     POCT hemoglobin  Encounter for immunization -     Flu vaccine trivalent PF, 6mos and older(Flulaval,Afluria,Fluarix,Fluzone)  Encounter for screening mammogram for malignant neoplasm of breast -     3D Screening Mammogram, Left and Right; Future  Screening for deficiency anemia -     CBC with Differential/Platelet  Screening for lipid disorders -     Lipid panel  Screening for  endocrine/metabolic/immunity disorders -     VITAMIN D 25 Hydroxy (Vit-D Deficiency, Fractures) -     TSH  Other orders -     Clobetasol Propionate; Apply 1 Application topically 2 (two) times daily.  Dispense: 50 mL; Refill: 1     No follow-ups on file.   Tommie Raymond, MD

## 2023-09-25 LAB — CBC WITH DIFFERENTIAL/PLATELET
Basophils Absolute: 0.1 10*3/uL (ref 0.0–0.2)
Basos: 1 %
EOS (ABSOLUTE): 0.3 10*3/uL (ref 0.0–0.4)
Eos: 3 %
Hematocrit: 46.5 % (ref 34.0–46.6)
Hemoglobin: 15.3 g/dL (ref 11.1–15.9)
Immature Grans (Abs): 0 10*3/uL (ref 0.0–0.1)
Immature Granulocytes: 0 %
Lymphocytes Absolute: 3.1 10*3/uL (ref 0.7–3.1)
Lymphs: 29 %
MCH: 31.3 pg (ref 26.6–33.0)
MCHC: 32.9 g/dL (ref 31.5–35.7)
MCV: 95 fL (ref 79–97)
Monocytes Absolute: 0.6 10*3/uL (ref 0.1–0.9)
Monocytes: 6 %
Neutrophils Absolute: 6.5 10*3/uL (ref 1.4–7.0)
Neutrophils: 61 %
Platelets: 410 10*3/uL (ref 150–450)
RBC: 4.89 x10E6/uL (ref 3.77–5.28)
RDW: 12.6 % (ref 11.7–15.4)
WBC: 10.6 10*3/uL (ref 3.4–10.8)

## 2023-09-25 LAB — TSH: TSH: 1.12 u[IU]/mL (ref 0.450–4.500)

## 2023-09-25 LAB — CMP14+EGFR
ALT: 11 [IU]/L (ref 0–32)
AST: 16 [IU]/L (ref 0–40)
Albumin: 4.4 g/dL (ref 3.8–4.9)
Alkaline Phosphatase: 107 [IU]/L (ref 44–121)
BUN/Creatinine Ratio: 10 (ref 9–23)
BUN: 8 mg/dL (ref 6–24)
Bilirubin Total: <0.2 mg/dL (ref 0.0–1.2)
CO2: 20 mmol/L (ref 20–29)
Calcium: 9.5 mg/dL (ref 8.7–10.2)
Chloride: 99 mmol/L (ref 96–106)
Creatinine, Ser: 0.78 mg/dL (ref 0.57–1.00)
Globulin, Total: 2.8 g/dL (ref 1.5–4.5)
Glucose: 178 mg/dL — ABNORMAL HIGH (ref 70–99)
Potassium: 4.2 mmol/L (ref 3.5–5.2)
Sodium: 139 mmol/L (ref 134–144)
Total Protein: 7.2 g/dL (ref 6.0–8.5)
eGFR: 89 mL/min/{1.73_m2} (ref >59)

## 2023-09-25 LAB — LIPID PANEL
Chol/HDL Ratio: 4.5 {ratio} — ABNORMAL HIGH (ref 0.0–4.4)
Cholesterol, Total: 193 mg/dL (ref 100–199)
HDL: 43 mg/dL (ref >39)
LDL Chol Calc (NIH): 130 mg/dL — ABNORMAL HIGH (ref 0–99)
Triglycerides: 111 mg/dL (ref 0–149)
VLDL Cholesterol Cal: 20 mg/dL (ref 5–40)

## 2023-09-25 LAB — VITAMIN D 25 HYDROXY (VIT D DEFICIENCY, FRACTURES): Vit D, 25-Hydroxy: 30.4 ng/mL (ref 30.0–100.0)

## 2023-09-29 NOTE — Progress Notes (Unsigned)
S:     No chief complaint on file.  57 y.o. female who presents for diabetes evaluation, education, and management. Patient was referred and last seen by Primary Care Provider, Dr. Andrey Campanile, on 07/03/23. Patient was last seen by Pharmacy on 07/16/23. PMH is significant for T2DM, HTN, dyslipidemia, hx CVA, migraines, GERD.   At last pharmacist visit on 8/27, patient was tolerating Ozempic well since increasing to 2 mg. Had only taken Humalog 12 units twice since increasing the Ozempic and PPBG were elevated. FBG at goal. Patient was instructed to restart Humalog 8 units with breakfast and dinner (hold if BG<150 or skipping a meal) and continue Semglee 50 units daily and Ozempic 2 mg weekly. Was also having issues with CGMs falling off and counseled patient to obtain overlay patches to help with adherence to skin. Plan to obtain updated lipid panel at f/u. Likely will need to increase dose of atorvastatin.  Today, ***  A1c and lipids today  A1C = 12.5% on 7/5 Review medications and adherence (timing of meds, etc.)  Side effects with meds? At home BGs?  Highs Lows  Hyperglycemia sx (nocturia, neuropathy, visual changes, foot exams) Hypoglycemia symptoms (dizziness, shaky, sweating, hungry, confusion) Diet Exercise  Last LDL 130, above goal <55 given hx of stroke with risk factors T2DM, HTN, HLD - would recommend increasing to atorvastatin 80 mg - CMP 10/1 LFTs good   Today, Patient arrives in good spirits and presents without any assistance. Reports issues with CGMs falling off--all BG readings are self-reported. Patient reports using Humalog 12 units only twice since starting Ozempic 2 mg. Patient reported some GI issues with initial dose of Ozempic 2 mg, but has improved since.   Family/Social History:  -FHx: DM, HLD, HTN -Tobacco: current 2 PPD smoker for 35 years (70 pack years) -Alcohol: no current use reported  Current diabetes medications include: Ozempic 2 mg weekly, insulin  glargine (Semglee) 50 units, insulin lispro 12 units BID AC Current hypertension medications include: irbesartan 300 mg daily Current hyperlipidemia medications include: atorvastatin 10 mg  Insurance coverage: UHC  Patient denies hypoglycemic events.  Reported home fasting blood sugars: avg 120s. Reported a 99, 175  Reported 2 hour post-meal/random blood sugars: avg 225. No numbers >250 in past 2 weeks.  Patient denies nocturia (nighttime urination).  Patient reports neuropathy (nerve pain). Some tingling in feet when she lays down for past 2 months. Not too bothersome Patient denies visual changes. Patient denies self foot exams.   Patient reported dietary habits: Eats 2 main meals/day -Drinks: no more sweet tea, still drinks dr. Reino Kent. More water intake  -BF: biscuit from [employer] or [employer]  -Lunch: usually skips or eats a salad.  -Dinner: largest meal of the day.   Eating more fruits & veggies, snack on nuts at work.  Patient-reported exercise habits: not discussed this visit  O:   ROS  Physical Exam Lab Results  Component Value Date   HGBA1C 12.5 (A) 06/28/2023   There were no vitals filed for this visit.  Lipid Panel     Component Value Date/Time   CHOL 193 09/24/2023 1600   TRIG 111 09/24/2023 1600   HDL 43 09/24/2023 1600   CHOLHDL 4.5 (H) 09/24/2023 1600   CHOLHDL 3.0 03/21/2021 0011   VLDL 16 03/21/2021 0011   LDLCALC 130 (H) 09/24/2023 1600   LDLDIRECT 152.1 01/19/2013 0905    Clinical Atherosclerotic Cardiovascular Disease (ASCVD): Yes  The ASCVD Risk score (Arnett DK, et al., 2019)  failed to calculate for the following reasons:   The patient has a prior MI or stroke diagnosis   A/P: Diabetes longstanding currently uncontrolled. Patient is able to verbalize appropriate hypoglycemia management plan. Medication adherence appears appropriate. Patient does not have CGM data due to sensor falling off. Reported PPBGs remain elevated (avg 225), FBG  are at goal (avg 120s). Counseled to restart sensors for next visit and to obtain overlay patches to help them stay on. Counseled on avoiding sugary sodas. Reported some minor tingling/numbness in toes when she lays down at night for the past 2 months.  -Continued basal insulin Semglee (insulin glargine) 50 units daily in the morning.  -Started rapid insulin Humalog (insulin lispro) 8 units BID before breakfast and dinner. Hold if BG < 150 or if skipping meal.  -Continued GLP-1 Ozempic (semaglutide) 2 mg weekly. -Patient educated on purpose, proper use, and potential adverse effects of DM meds.  -Extensively discussed pathophysiology of diabetes, recommended lifestyle interventions, dietary effects on blood sugar control.  -Counseled on s/sx of and management of hypoglycemia.  -Next A1c anticipated 08/2023.   ASCVD risk - secondary prevention in patient with diabetes. Last LDL is 147 not at goal of <82 mg/dL. high intensity statin indicated, will likely need to increase dose of atorvastatin after results return next month.  -repeat lipid panel in 1 month -Continued atorvastatin 10 mg.   Written patient instructions provided. Patient verbalized understanding of treatment plan.  Total time in face to face counseling 30 minutes.    Follow-up:  Pharmacist in 1 month PCP clinic visit in 5 weeks Patient seen with Rosina Lowenstein, PharmD Candidate  Butch Penny, PharmD, BCACP, CPP Clinical Pharmacist [facility] [phone]

## 2023-09-30 ENCOUNTER — Encounter: Payer: Self-pay | Admitting: Pharmacist

## 2023-09-30 ENCOUNTER — Ambulatory Visit: Payer: 59 | Attending: Family Medicine | Admitting: Pharmacist

## 2023-09-30 DIAGNOSIS — Z794 Long term (current) use of insulin: Secondary | ICD-10-CM

## 2023-09-30 DIAGNOSIS — E119 Type 2 diabetes mellitus without complications: Secondary | ICD-10-CM | POA: Diagnosis not present

## 2023-09-30 DIAGNOSIS — Z7985 Long-term (current) use of injectable non-insulin antidiabetic drugs: Secondary | ICD-10-CM

## 2023-09-30 DIAGNOSIS — E118 Type 2 diabetes mellitus with unspecified complications: Secondary | ICD-10-CM | POA: Diagnosis not present

## 2023-09-30 MED ORDER — ATORVASTATIN CALCIUM 80 MG PO TABS: 80.0000 mg | ORAL_TABLET | Freq: Every day | ORAL | 3 refills | Status: DC

## 2023-10-01 ENCOUNTER — Ambulatory Visit
Admission: RE | Admit: 2023-10-01 | Discharge: 2023-10-01 | Disposition: A | Discharge status: Home / Self Care | Payer: 59 | Source: Ambulatory Visit | Attending: Family Medicine | Admitting: Family Medicine

## 2023-10-01 DIAGNOSIS — Z1231 Encounter for screening mammogram for malignant neoplasm of breast: Secondary | ICD-10-CM

## 2023-11-04 ENCOUNTER — Ambulatory Visit: Payer: 59 | Attending: Family Medicine | Admitting: Pharmacist

## 2023-11-04 ENCOUNTER — Encounter: Payer: Self-pay | Admitting: Pharmacist

## 2023-11-04 DIAGNOSIS — Z794 Long term (current) use of insulin: Secondary | ICD-10-CM | POA: Diagnosis not present

## 2023-11-04 DIAGNOSIS — Z7985 Long-term (current) use of injectable non-insulin antidiabetic drugs: Secondary | ICD-10-CM | POA: Diagnosis not present

## 2023-11-04 DIAGNOSIS — E118 Type 2 diabetes mellitus with unspecified complications: Secondary | ICD-10-CM

## 2023-11-04 MED ORDER — INSULIN GLARGINE-YFGN 100 UNIT/ML ~~LOC~~ SOPN: 42.0000 [IU] | PEN_INJECTOR | Freq: Every day | SUBCUTANEOUS | 2 refills | Status: DC

## 2023-11-04 NOTE — Progress Notes (Signed)
S:     No chief complaint on file.  57 y.o. female who presents for diabetes evaluation, education, and management. Patient was referred and last seen by Primary Care Provider, Dr. Andrey Campanile, on 07/03/23. Patient was last seen by Pharmacy on 07/16/23. PMH is significant for T2DM, HTN, dyslipidemia, hx CVA, migraines, GERD.   At pharmacist visit on 8/27, patient was tolerating Ozempic well since increasing to 2 mg. Had only taken Humalog 12 units twice since increasing the Ozempic and PPBG were elevated. FBG at goal. Patient was instructed to restart Humalog 8 units with breakfast and dinner (hold if BG<150 or skipping a meal) and continue Semglee 50 units daily and Ozempic 2 mg weekly.   Had visit with PCP on 10/1. A1c improved to 8.4%, LDL still elevated at 130. Last pharmacist visit on 10/7, patient was having overnight hypoglycemia on Libre sensor and dinner time Humalog was decreased to 4 units. Atorvastatin was increased from 10 to 80 mg daily.   Today patient presents to clinic in good spirits. Since last visit, she has continued to have overnight hypoglycemia (BG 60s) despite discontinuing Humalog 2 weeks ago. Otherwise adherent to Semglee and Ozempic. Tolerating Ozempic well. No n/v or abdominal pain, but has noticed some mild appetite suppression. Also reports she is taking increased dose of atorvastatin. She was able to schedule dermatology appointment for April 2025.  Family/Social History:  -FHx: DM, HLD, HTN -Tobacco: current 2 PPD smoker for 35 years (70 pack years) -Alcohol: no current use reported  Current diabetes medications include: Ozempic 2 mg weekly, insulin glargine (Semglee) 50 units (AM), Humalog 4 units BID AC, hold if BG <150 (stopped taking ~2 weeks ago due to hypoglycemia overnight) Current hypertension medications include: irbesartan 300 mg daily Current hyperlipidemia medications include: atorvastatin 80 mg  Insurance coverage: UHC  Patient reports hypoglycemic  episodes that wake her up in the middle of the night. Has had 3 episodes in the past week.  Patient denies nocturia (nighttime urination).  Patient dnies neuropathy (nerve pain).  Patient denies visual changes. Patient denies self foot exams.   Patient reported dietary habits: Eats 3 main meals/day -Drinks: no more sweet tea, still drinks dr. Reino Kent. More water intake  -BF: eggs and sausage -Lunch: usually skips or eats a salad -Dinner: largest meal of the day - has cut out bread  Patient-reported exercise habits: not discussed this visit  O:   ROS  Physical Exam  There were no vitals filed for this visit.  CGM Report:  Last 7 days:  Above: 12% In Target: 80% Below: 8% (BG 60s) Avg 127  A1c on 10/1: 8.4%  Lipid Panel     Component Value Date/Time   CHOL 193 09/24/2023 1600   TRIG 111 09/24/2023 1600   HDL 43 09/24/2023 1600   CHOLHDL 4.5 (H) 09/24/2023 1600   CHOLHDL 3.0 03/21/2021 0011   VLDL 16 03/21/2021 0011   LDLCALC 130 (H) 09/24/2023 1600   LDLDIRECT 152.1 01/19/2013 0905    Clinical Atherosclerotic Cardiovascular Disease (ASCVD): Yes  The ASCVD Risk score (Arnett DK, et al., 2019) failed to calculate for the following reasons:   The patient has a prior MI or stroke diagnosis   A/P: Diabetes longstanding currently uncontrolled, but improving based on A1c 8.4%, down from 12.5% and TIR 80% from the past 7 days. Patient is able to verbalize appropriate hypoglycemia management plan. Medication adherence appears appropriate. She has continued to have episodes of hypoglycemia overnight despite discontinuing Humalog  about 2 weeks ago so we will remove this from her med list and decrease Semglee to prevent further lows. Will continue Ozempic since she is tolerating well and this is low risk of hypoglycemia. Patient has a hx of BG fluctuations with psoriasis flares. Counseled her to let us know if she continues to experience hypoglycemia or has new persistent  hyperglycemia prior to next visit. - Decrease Semglee to 42 units daily in the morning - Discontinue Humalog - Continue Ozempic 2 mg weekly -Patient educated on purpose, proper use, and potential adverse effects of DM meds.  -Extensively discussed pathophysiology of diabetes, recommended lifestyle interventions, dietary effects on blood sugar control.  -Counseled on s/sx of and management of hypoglycemia.  -Next A1c anticipated 12/25/23   ASCVD risk - secondary prevention in patient with diabetes. Last LDL is 130 not at goal of <16 mg/dL. high intensity statin indicated. - Increased atorvastatin to 80 mg daily - F/u lipid panel due at next visit   She has not heard from dermatologist to schedule appt. Referral was placed on 8/27. Provided her with address and phone number to reach out to determine what additional information they need to schedule appt.   Written patient instructions provided. Patient verbalized understanding of treatment plan.  Total time in face to face counseling 20 minutes.    Follow-up:  Pharmacist in 1 month   Jarrett Ables, PharmD PGY-1 Pharmacy Resident

## 2023-12-04 ENCOUNTER — Other Ambulatory Visit: Payer: Self-pay | Admitting: Family Medicine

## 2023-12-09 ENCOUNTER — Encounter: Payer: Self-pay | Admitting: Pharmacist

## 2023-12-09 ENCOUNTER — Other Ambulatory Visit: Payer: Self-pay | Admitting: Family Medicine

## 2023-12-09 ENCOUNTER — Ambulatory Visit: Payer: 59 | Attending: Family Medicine | Admitting: Pharmacist

## 2023-12-09 DIAGNOSIS — E785 Hyperlipidemia, unspecified: Secondary | ICD-10-CM | POA: Diagnosis not present

## 2023-12-09 DIAGNOSIS — E119 Type 2 diabetes mellitus without complications: Secondary | ICD-10-CM | POA: Diagnosis not present

## 2023-12-09 DIAGNOSIS — Z794 Long term (current) use of insulin: Secondary | ICD-10-CM

## 2023-12-09 DIAGNOSIS — F411 Generalized anxiety disorder: Secondary | ICD-10-CM

## 2023-12-09 DIAGNOSIS — Z7985 Long-term (current) use of injectable non-insulin antidiabetic drugs: Secondary | ICD-10-CM

## 2023-12-09 LAB — POCT GLYCOSYLATED HEMOGLOBIN (HGB A1C): HbA1c, POC (controlled diabetic range): 7.2 % — AB (ref 0.0–7.0)

## 2023-12-09 MED ORDER — SEMAGLUTIDE (1 MG/DOSE) 4 MG/3ML ~~LOC~~ SOPN: 1.0000 mg | PEN_INJECTOR | SUBCUTANEOUS | 1 refills | Status: DC

## 2023-12-09 NOTE — Progress Notes (Signed)
S:     No chief complaint on file.  57 y.o. female who presents for diabetes evaluation, education, and management. Patient was referred and last seen by Primary Care Provider, Dr. Andrey Campanile, on 07/03/23. Patient was last seen by Pharmacy on 09/24/2023. PMH is significant for T2DM, HTN, dyslipidemia, hx CVA, migraines, GERD.   At pharmacist visit on 11/04/2023, patient was tolerating Ozempic well but had some continued hypoglycemia despite stopping Humalog. We decreased her Semglee dose.   Today patient presents to clinic in good spirits. A1c today is 7.2! Commended her for this! Since last visit, she is doing well but endorses diarrhea that occurs 2-3 days after injecting Ozempic. This did not occur with the 1mg  dose. Otherwise tolerating okay. No n/v or abdominal pain. No reported changes in vision.  Family/Social History:  -FHx: DM, HLD, HTN -Tobacco: current 2 PPD smoker for 35 years (70 pack years) -Alcohol: no current use reported  Current diabetes medications include: Ozempic 2 mg weekly, insulin glargine (Semglee) 40 units (AM),  Current hypertension medications include: irbesartan 300 mg daily Current hyperlipidemia medications include: atorvastatin 80 mg  Insurance coverage: UHC  Patient endorses 1-2 isolated hypoglycemia readings. She was asymptomatic and edorses readings in the 60s.  Patient denies nocturia (nighttime urination).  Patient denies neuropathy (nerve pain).  Patient denies visual changes. Patient denies self foot exams.   Patient reported dietary habits: Eats 3 main meals/day -Drinks: no more sweet tea, still drinks dr. Reino Kent. More water intake  -BF: eggs and sausage -Lunch: usually skips or eats a salad -Dinner: largest meal of the day - has cut out bread  Patient-reported exercise habits: not discussed this visit  O:   Lab Results  Component Value Date   HGBA1C 7.2 (A) 12/09/2023    Lipid Panel     Component Value Date/Time   CHOL 193  09/24/2023 1600   TRIG 111 09/24/2023 1600   HDL 43 09/24/2023 1600   CHOLHDL 4.5 (H) 09/24/2023 1600   CHOLHDL 3.0 03/21/2021 0011   VLDL 16 03/21/2021 0011   LDLCALC 130 (H) 09/24/2023 1600   LDLDIRECT 152.1 01/19/2013 0905    Clinical Atherosclerotic Cardiovascular Disease (ASCVD): Yes  The ASCVD Risk score (Arnett DK, et al., 2019) failed to calculate for the following reasons:   Risk score cannot be calculated because patient has a medical history suggesting prior/existing ASCVD   A/P: Diabetes longstanding currently close to goal! Patient is able to verbalize appropriate hypoglycemia management plan. Medication adherence appears appropriate. Will decrease Ozempic d/t persistent diarrhea. Counseled her to let us know if she continues to experience hypoglycemia or has new persistent hyperglycemia prior to next visit. - Decrease Semglee to 36 units daily in the morning - Decrease Ozempic to 1 mg weekly to see if diarrhea resolves.  -Patient educated on purpose, proper use, and potential adverse effects of DM meds.  -Extensively discussed pathophysiology of diabetes, recommended lifestyle interventions, dietary effects on blood sugar control.  -Counseled on s/sx of and management of hypoglycemia.  -Next A1c anticipated 02/2024   ASCVD risk - secondary prevention in patient with diabetes. Last LDL is 130 not at goal of <44 mg/dL. high intensity statin indicated. - Continue atorvastatin to 80 mg daily - Lipid   Written patient instructions provided. Patient verbalized understanding of treatment plan.  Total time in face to face counseling 20 minutes.    Follow-up:  Pharmacist in 2 months.  Butch Penny, PharmD, BCACP, CPP Clinical Pharmacist [facility] [phone]

## 2023-12-09 NOTE — Telephone Encounter (Signed)
Medication Refill -  Most Recent Primary Care Visit:  Provider: Drucilla Chalet  Department: [facility]  Visit Type: OFFICE VISIT  Date: 12/09/2023  Medication: Trazodone 150 mg  generic  Has the patient contacted their pharmacy? Yes (Agent: If no, request that the patient contact the pharmacy for the refill. If patient does not wish to contact the pharmacy document the reason why and proceed with request.) (Agent: If yes, when and what did the pharmacy advise?)  Is this the correct pharmacy for this prescription? Yes If no, delete pharmacy and type the correct one.  This is the patient's preferred pharmacy:   [facility]  [address]  Has the prescription been filled recently? Yes  Is the patient out of the medication? Yes  Has the patient been seen for an appointment in the last year OR does the patient have an upcoming appointment? Yes  Can we respond through MyChart? Yes  Agent: Please be advised that Rx refills may take up to 3 business days. We ask that you follow-up with your pharmacy.

## 2023-12-10 LAB — LIPID PANEL
Chol/HDL Ratio: 2.6 {ratio} (ref 0.0–4.4)
Cholesterol, Total: 92 mg/dL — ABNORMAL LOW (ref 100–199)
HDL: 35 mg/dL — ABNORMAL LOW (ref >39)
LDL Chol Calc (NIH): 41 mg/dL (ref 0–99)
Triglycerides: 75 mg/dL (ref 0–149)
VLDL Cholesterol Cal: 16 mg/dL (ref 5–40)

## 2023-12-10 MED ORDER — TRAZODONE HCL 150 MG PO TABS
ORAL_TABLET | ORAL | 1 refills | Status: DC
Start: 2023-12-10 — End: 2024-06-03

## 2023-12-10 NOTE — Telephone Encounter (Signed)
Requested Prescriptions  Pending Prescriptions Disp Refills   traZODone (DESYREL) 150 MG tablet 90 tablet 1    Sig: TAKE 1 TABLET(150 MG) BY MOUTH AT BEDTIME     Psychiatry: Antidepressants - Serotonin Modulator Passed - 12/10/2023 11:46 AM      Passed - Completed PHQ-2 or PHQ-9 in the last 360 days      Passed - Valid encounter within last 6 months    Recent Outpatient Visits           Yesterday Type 2 diabetes mellitus without complication, with long-term current use of insulin (HCC)   [facility] Of [place]. [facility] Lois Huxley, [place] L, RPH-CPP   1 month ago Diabetes mellitus with complication [facility])   [facility] - A Dept Of [place]. [facility] Lois Huxley, [place] L, RPH-CPP   2 months ago Insulin-requiring or dependent type II diabetes mellitus [facility])   [facility] - A Dept Of [place]. [facility] Drucilla Chalet, RPH-CPP   2 months ago Annual physical exam   [facility], MD   3 months ago Insulin-requiring or dependent type II diabetes mellitus [facility])   [facility] - A Dept Of [place]. [facility] Drucilla Chalet, RPH-CPP       Future Appointments             In 3 weeks Georganna Skeans, MD [facility]   In 1 month Lois Huxley, Cornelius Moras, RPH-CPP [facility] - A Dept Of . [facility]   In 3 months Terri Piedra, DO [facility]

## 2023-12-31 ENCOUNTER — Ambulatory Visit: Payer: 59 | Admitting: Family Medicine

## 2024-01-23 ENCOUNTER — Ambulatory Visit (INDEPENDENT_AMBULATORY_CARE_PROVIDER_SITE_OTHER): Payer: 59 | Admitting: Family Medicine

## 2024-01-23 ENCOUNTER — Encounter: Payer: Self-pay | Admitting: Family Medicine

## 2024-01-23 VITALS — BP 106/69 | HR 81 | Temp 98.1°F | Resp 16 | Ht 62.0 in | Wt 135.2 lb

## 2024-01-23 DIAGNOSIS — E785 Hyperlipidemia, unspecified: Secondary | ICD-10-CM | POA: Diagnosis not present

## 2024-01-23 DIAGNOSIS — Z1211 Encounter for screening for malignant neoplasm of colon: Secondary | ICD-10-CM

## 2024-01-23 DIAGNOSIS — Z794 Long term (current) use of insulin: Secondary | ICD-10-CM

## 2024-01-23 DIAGNOSIS — E118 Type 2 diabetes mellitus with unspecified complications: Secondary | ICD-10-CM | POA: Diagnosis not present

## 2024-01-23 DIAGNOSIS — Z7985 Long-term (current) use of injectable non-insulin antidiabetic drugs: Secondary | ICD-10-CM

## 2024-01-23 DIAGNOSIS — I10 Essential (primary) hypertension: Secondary | ICD-10-CM | POA: Diagnosis not present

## 2024-01-23 DIAGNOSIS — F411 Generalized anxiety disorder: Secondary | ICD-10-CM

## 2024-01-27 ENCOUNTER — Encounter: Payer: Self-pay | Admitting: Family Medicine

## 2024-01-27 NOTE — Progress Notes (Signed)
Established Patient Office Visit  Subjective    Patient ID: Ashley [name], female    DOB: 1966-06-12  Age: 58 y.o. MRN: [id number]  CC:  Chief Complaint  Patient presents with   Follow-up    3 month    HPI NALEA SALCE presents for routine follow up of chronic med issues including diabetes, hypertension, and anxiety.  Outpatient Encounter Medications as of 01/23/2024  Medication Sig   atorvastatin (LIPITOR) 80 MG tablet Take 1 tablet (80 mg total) by mouth daily.   BD PEN NEEDLE NANO 2ND GEN 32G X 4 MM MISC Inject into the skin 4 (four) times daily.   clobetasol (TEMOVATE) 0.05 % external solution Apply 1 Application topically 2 (two) times daily.   Continuous Blood Gluc Receiver (FREESTYLE LIBRE 2 READER) DEVI 1 Act by Does not apply route daily.   Continuous Glucose Sensor (FREESTYLE LIBRE 2 SENSOR) MISC USE TO CHECK BLOOD SUGER DAILY   insulin glargine-yfgn (SEMGLEE, YFGN,) 100 UNIT/ML Pen Inject 42 Units into the skin daily.   Insulin Pen Needle 32G X 6 MM MISC Inject 1 Act into the skin 4 (four) times daily. Use daily as directed   irbesartan (AVAPRO) 300 MG tablet TAKE 1 TABLET(300 MG) BY MOUTH DAILY   Lancet Devices (ADJUSTABLE LANCING DEVICE) MISC Up to tid testing   mirtazapine (REMERON) 15 MG tablet TAKE 1 TABLET(15 MG) BY MOUTH AT BEDTIME   promethazine (PHENERGAN) 25 MG tablet Take 1 tablet (25 mg total) by mouth every 8 (eight) hours as needed for nausea or vomiting.   Semaglutide, 1 MG/DOSE, 4 MG/3ML SOPN Inject 1 mg as directed once a week.   traZODone (DESYREL) 150 MG tablet TAKE 1 TABLET(150 MG) BY MOUTH AT BEDTIME   benzonatate (TESSALON) 200 MG capsule Take 1 capsule by mouth every 8 (eight) hours for cough. (Patient not taking: Reported on 01/23/2024)   fluconazole (DIFLUCAN) 150 MG tablet One po days 1, 4 and 7. (Patient not taking: Reported on 01/23/2024)   No facility-administered encounter medications on file as of 01/23/2024.    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 Dayton Martes, 07/2014   GERD (gastroesophageal reflux disease)    H/O exercise stress test 2011   done at [facility]- told that it was WNL, done due to pt. having panic attacks    History of blood transfusion 01-01-1966   at birth in [place], [place], unsure number of units   PONV (postoperative nausea and vomiting)    Poor dentition    very poor oral health    SVD (spontaneous vaginal delivery)    x 3   Tobacco abuse    Wears glasses     Past Surgical History:  Procedure Laterality Date   ABDOMINAL HYSTERECTOMY     APPENDECTOMY  1988   BREAST LUMPECTOMY  2021   BREAST LUMPECTOMY WITH RADIOACTIVE SEED LOCALIZATION Left 03/17/2020   Procedure: LEFT BREAST LUMPECTOMY WITH RADIOACTIVE SEED LOCALIZATION;  Surgeon: Abigail Miyamoto, MD;  Location: [facility] OR;  Service: General;  Laterality: Left;  LMA   CARDIAC CATHETERIZATION N/A 08/17/2016   Procedure: Left Heart Cath and Coronary Angiography;  Surgeon: Kathleene Hazel, MD;  [facility] INVASIVE CV LAB;  Service: Cardiovascular;  Laterality: N/A;   CHOLECYSTECTOMY N/A 06/07/2014   Procedure: LAPAROSCOPIC CHOLECYSTECTOMY;  Surgeon: Axel Filler, MD;  Location: [facility] OR;  Service: General;  Laterality: N/A;   CHOLECYSTECTOMY  06/07/2014   COLONOSCOPY  02/16/2014   normal    ESOPHAGOGASTRODUODENOSCOPY  12/28/2013   KNEE ARTHROSCOPY  1995   left   LAPAROSCOPIC ASSISTED VAGINAL HYSTERECTOMY N/A 04/15/2014   Procedure: LAPAROSCOPIC ASSISTED VAGINAL HYSTERECTOMY;  Surgeon: Zelphia Cairo, MD;  Location: [facility] ORS;  Service: Gynecology;  Laterality: N/A;   LAPAROSCOPIC BILATERAL SALPINGO OOPHERECTOMY Bilateral 04/15/2014   Procedure: LAPAROSCOPIC BILATERAL SALPINGO OOPHORECTOMY;  Surgeon: Zelphia Cairo, MD;  Location: [facility]S;  Service: Gynecology;  Laterality: Bilateral;   TUBAL LIGATION      Family History  Problem Relation Age of Onset   Diabetes Mother    Hyperlipidemia Mother    Hypertension Mother    Anxiety disorder Mother    Depression Mother    Drug abuse Mother    Stroke Mother        x3   Prostate cancer Father    Alcohol abuse Father    Lung cancer Maternal Grandfather        smoked   Emphysema Maternal Grandfather        smoked   COPD Maternal Grandmother        never smoked    Dementia Maternal Grandmother    Thyroid cancer Paternal Aunt    Colon cancer Neg Hx    Rectal cancer Neg Hx    Stomach cancer Neg Hx    Migraines Neg Hx     Social History   Socioeconomic History   Marital status: Married    Spouse name: Gery Pray   Number of children: 6   Years of education: 12   Highest education level: Not on file  Occupational History   Occupation:      Comment: helps run a friends business  Tobacco Use   Smoking status: Every Day    Current packs/day: 2.00    Average packs/day: 2.0 packs/day for 35.0 years (70.0 ttl pk-yrs)    Types: Cigarettes   Smokeless tobacco: Never  Vaping Use   Vaping status: Never Used  Substance and Sexual Activity   Alcohol use: No    Alcohol/week: 0.0 standard drinks of alcohol   Drug use: No   Sexual activity: Yes    Partners: Male    Birth control/protection: Surgical  Other Topics Concern   Not on file  Social History Narrative   5 living children, one child is a crack cocaine addict who often breaks into their house to steal money. He is currently in jail. One child died of cerebral palsy.   Caffeine use: Dr Reino Kent (3 per day)   2 cups coffee per day   Online student, currently not working    Social Drivers of [employer] Strain: Low Risk  (09/24/2023)   Overall Financial Resource Strain (CARDIA)    Difficulty of Paying Living Expenses: Not very hard  Food Insecurity: No Food Insecurity (01/25/2021)   [facility]   Hunger Vital Sign    Worried About Running Out of Food in the Last Year: Never true  Ran Out of Food in the Last Year: Never true  Transportation Needs: No Transportation Needs (09/24/2023)   PRAPARE - Administrator, Civil Service (Medical): No    Lack of Transportation (Non-Medical): No  Physical Activity: Insufficiently Active (09/24/2023)   Exercise Vital Sign    Days of Exercise per Week: 3 days    Minutes of Exercise per Session: 30 min  Stress: No Stress Concern Present (09/24/2023)   [employer] of Occupational Health - Occupational Stress Questionnaire    Feeling of Stress : Not at all  Social Connections: Unknown (09/24/2023)   Social Connection and Isolation Panel [NHANES]    Frequency of Communication with Friends and Family: More than three times a week    Frequency of Social Gatherings with Friends and Family: More than three times a week    Attends Religious Services: More than 4 times per year    Active Member of [employer] or Organizations: Yes    Attends Banker Meetings: 1 to 4 times per year    Marital Status: Not on file  Intimate Partner Violence: Not At Risk (09/24/2023)   Humiliation, Afraid, Rape, and Kick questionnaire    Fear of Current or Ex-Partner: No    Emotionally Abused: No    Physically Abused: No    Sexually Abused: No    Review of Systems  All other systems reviewed and are negative.       Objective    BP 106/69 (BP Location: Right Arm, Patient Position: Sitting, Cuff Size: Normal)   Pulse 81   Temp 98.1 F (36.7 C) (Oral)   Resp 16   Ht 5\' 2"  (1.575 m)   Wt 135 lb 3.2 oz (61.3 kg)   LMP 02/12/2014   SpO2 95%   BMI 24.73 kg/m    Physical Exam Vitals and nursing note reviewed.  Constitutional:      General: She is not in acute distress. Cardiovascular:     Rate and Rhythm: Normal rate and regular rhythm.  Pulmonary:     Effort: Pulmonary effort is normal.     Breath sounds: Normal breath sounds.  Abdominal:     Palpations: Abdomen is soft.     Tenderness: There is no abdominal tenderness.  Neurological:     General: No focal deficit present.     Mental Status: She is alert and oriented to person, place, and time.  Psychiatric:        Mood and Affect: Mood normal.        Behavior: Behavior normal.         Assessment & Plan:   Diabetes mellitus with complication (HCC)  Screening for colon cancer -     Ambulatory referral to Gastroenterology  Essential hypertension  Hyperlipidemia, unspecified hyperlipidemia type  Anxiety state  Encounter for long-term (current) use of insulin (HCC)  Long-term current use of injectable noninsulin antidiabetic medication     Return in about 3 months (around 04/22/2024) for follow up, chronic med issues.   Tommie Raymond, MD

## 2024-01-30 ENCOUNTER — Ambulatory Visit: Payer: 59 | Admitting: Pharmacist

## 2024-02-04 ENCOUNTER — Other Ambulatory Visit: Payer: Self-pay | Admitting: Family Medicine

## 2024-02-04 DIAGNOSIS — E118 Type 2 diabetes mellitus with unspecified complications: Secondary | ICD-10-CM

## 2024-02-19 NOTE — Progress Notes (Unsigned)
 S:     No chief complaint on file.  58 y.o. female with PMHx significant for T2DM, HTN, HLD, CVA, migraines, and GERD who presents for diabetes evaluation, education, and management.   Patient was referred by Dr. Andrey Campanile on 07/03/2023. She last saw Dr. Andrey Campanile on 01/23/2024. No medications changes were made at this time. She was referred to GI for colon cancer screening.  She was last seen by clinic pharmacist on 12/09/2023. A1c was decreased to 7.2 (previously 12.5 in July 2024). She reported diarrhea 2-3 days following weekly Ozempic 2 mg injection. She also reported 1-2 hypoglycemic readings (BG in 60's) but denied symptoms of hypoglycemia. Semglee was decreased to 36 units and Ozempic was decreased back to 1 mg dose.   At that LDL was found to be 41, much improved compared to October 2024 (130).   Patient arrives in *** good spirits and presents without *** any assistance. ***Patient is accompanied by ***  Family/Social History:  -Family History: DM, HLD, HTN -Tobacco Use: 2 PPD smoker for 35 years (70 pack years) -Alcohol Use:   Current diabetes medications include:  -Ozempic 1 mg weekly (decreased from 2 mg d/t diarreha) -Semglee 36 units daily (decreased from 40 units)  Current hypertension medications include:  -Irbesartan 300 mg daily   Current hyperlipidemia medications include:  -Atorvastatin 80 mg daily   Patient reports adherence to taking all medications as prescribed.  *** Patient denies adherence with medications, reports missing *** medications *** times per week, on average.  Do you feel that your medications are working for you? {YES NO:22349} Have you been experiencing any side effects to the medications prescribed? {YES NO:22349} Do you have any problems obtaining medications due to transportation or finances? {YES [id number] Insurance coverage: ***  Patient {Actions; denies-reports:120008} hypoglycemic events.  Reported home fasting blood sugars: ***   Reported 2 hour post-meal/random blood sugars: ***.  Patient {Actions; denies-reports:120008} nocturia (nighttime urination).  Patient {Actions; denies-reports:120008} neuropathy (nerve pain). Patient {Actions; denies-reports:120008} visual changes. Patient {Actions; denies-reports:120008} self foot exams.   Patient reported dietary habits: Eats *** meals/day Breakfast: *** Lunch: *** Dinner: *** Snacks: *** Drinks: ***  Within the past 12 months, did you worry whether your food would run out before you got money to buy more? {YES NO:22349} Within the past 12 months, did the food you bought run out, and you didn't have money to get more? {YES NO:22349} PHQ-9 Score: ***  Patient-reported exercise habits: ***   O:   7 day average blood glucose: ***  Libre3 CGM Download today *** % Time CGM is active: ***% Average Glucose: *** mg/dL Glucose Management Indicator: ***  Glucose Variability: ***% (goal <36%) Time in Goal:  - Time in range 70-180: ***% - Time above range: ***% - Time below range: ***% Observed patterns:   Lab Results  Component Value Date   HGBA1C 7.2 (A) 12/09/2023   There were no vitals filed for this visit.  Lipid Panel     Component Value Date/Time   CHOL 92 (L) 12/09/2023 0914   TRIG 75 12/09/2023 0914   HDL 35 (L) 12/09/2023 0914   CHOLHDL 2.6 12/09/2023 0914   CHOLHDL 3.0 03/21/2021 0011   VLDL 16 03/21/2021 0011   LDLCALC 41 12/09/2023 0914   LDLDIRECT 152.1 01/19/2013 0905    Clinical Atherosclerotic Cardiovascular Disease (ASCVD): {YES/NO:21197} The ASCVD Risk score (Arnett DK, et al., 2019) failed to calculate for the following reasons:   Risk score cannot be calculated because  patient has a medical history suggesting prior/existing ASCVD   Patient is participating in a Managed Medicaid Plan:  No   A/P: Diabetes longstanding *** currently ***. Patient is *** able to verbalize appropriate hypoglycemia management plan. Medication  adherence appears ***. Control is suboptimal due to ***. -{Meds adjust:18428} basal insulin *** Lantus/Basaglar/Semglee (insulin glargine) *** Tresiba (insulin degludec) from *** units to *** units daily in the morning. Patient will continue to titrate 1 unit every *** days if fasting blood sugar > 100mg /dl until fasting blood sugars reach goal or next visit.  -{Meds adjust:18428} rapid insulin *** Novolog (insulin aspart) *** Humalog (insulin lispro) from *** to ***.  -{Meds adjust:18428} GLP-1 *** Trulicity (dulaglutide) *** Ozempic (semaglutide) *** Mounjaro (tirzepatide) from *** mg to *** mg .  -{Meds adjust:18428} SGLT2-I *** Farxiga (dapagliflozin) *** Jardiance (empagliflozin) 10 mg. Counseled on sick day rules. -{Meds adjust:18428} metformin ***.  -Patient educated on purpose, proper use, and potential adverse effects of ***.  -Extensively discussed pathophysiology of diabetes, recommended lifestyle interventions, dietary effects on blood sugar control.  -Counseled on s/sx of and management of hypoglycemia.  -Next A1c anticipated ***.   ASCVD risk - primary ***secondary prevention in patient with diabetes. Last LDL is *** not at goal of <16 *** mg/dL. ASCVD risk factors include *** and 10-year ASCVD risk score of ***. {Desc; low/moderate/high:110033} intensity statin indicated.  -{Meds adjust:18428} ***statin *** mg.   Hypertension longstanding *** currently ***. Blood pressure goal of <130/80 *** mmHg. Medication adherence ***. Blood pressure control is suboptimal due to ***. -{Meds adjust:18428} *** mg.  Written patient instructions provided. Patient verbalized understanding of treatment plan.  Total time in face to face counseling *** minutes.    Follow-up:  Pharmacist *** PCP clinic visit in *** Patient seen with ***

## 2024-02-20 ENCOUNTER — Encounter: Payer: Self-pay | Admitting: Pharmacist

## 2024-02-20 ENCOUNTER — Ambulatory Visit: Payer: 59 | Attending: Family Medicine | Admitting: Pharmacist

## 2024-02-20 DIAGNOSIS — Z7985 Long-term (current) use of injectable non-insulin antidiabetic drugs: Secondary | ICD-10-CM

## 2024-02-20 DIAGNOSIS — E119 Type 2 diabetes mellitus without complications: Secondary | ICD-10-CM

## 2024-02-20 DIAGNOSIS — Z794 Long term (current) use of insulin: Secondary | ICD-10-CM

## 2024-02-20 LAB — POCT GLYCOSYLATED HEMOGLOBIN (HGB A1C): HbA1c, POC (controlled diabetic range): 8.7 % — AB (ref 0.0–7.0)

## 2024-02-20 MED ORDER — SEMAGLUTIDE (2 MG/DOSE) 8 MG/3ML ~~LOC~~ SOPN: 2.0000 mg | PEN_INJECTOR | SUBCUTANEOUS | 1 refills | Status: DC

## 2024-02-21 LAB — CMP14+EGFR
ALT: 29 [IU]/L (ref 0–32)
AST: 19 [IU]/L (ref 0–40)
Albumin: 4.5 g/dL (ref 3.8–4.9)
Alkaline Phosphatase: 161 [IU]/L — ABNORMAL HIGH (ref 44–121)
BUN/Creatinine Ratio: 13 (ref 9–23)
BUN: 11 mg/dL (ref 6–24)
Bilirubin Total: 0.6 mg/dL (ref 0.0–1.2)
CO2: 21 mmol/L (ref 20–29)
Calcium: 9.7 mg/dL (ref 8.7–10.2)
Chloride: 103 mmol/L (ref 96–106)
Creatinine, Ser: 0.82 mg/dL (ref 0.57–1.00)
Globulin, Total: 2.4 g/dL (ref 1.5–4.5)
Glucose: 148 mg/dL — ABNORMAL HIGH (ref 70–99)
Potassium: 4.8 mmol/L (ref 3.5–5.2)
Sodium: 140 mmol/L (ref 134–144)
Total Protein: 6.9 g/dL (ref 6.0–8.5)
eGFR: 83 mL/min/{1.73_m2} (ref >59)

## 2024-02-21 LAB — MICROALBUMIN / CREATININE URINE RATIO
Creatinine, Urine: 126.5 mg/dL
Microalb/Creat Ratio: 4 mg/g{creat} (ref 0–29)
Microalbumin, Urine: 5.6 ug/mL

## 2024-03-05 ENCOUNTER — Other Ambulatory Visit: Payer: Self-pay | Admitting: Family Medicine

## 2024-03-23 ENCOUNTER — Other Ambulatory Visit: Payer: Self-pay | Admitting: Family Medicine

## 2024-03-25 ENCOUNTER — Telehealth: Payer: Self-pay | Admitting: *Deleted

## 2024-03-25 NOTE — Telephone Encounter (Signed)
 Copied from CRM [id number]. Topic: General - Other >> Mar 25, 2024 12:47 PM Carlatta H wrote: Reason for CRM: Patient would like to reschedule appointment with Georgiana Shore Ausdall//Unable to reschedule in epic//Please call patient to reschedule

## 2024-03-27 ENCOUNTER — Ambulatory Visit: Payer: 59 | Admitting: Pharmacist

## 2024-04-01 ENCOUNTER — Encounter: Payer: Self-pay | Admitting: Dermatology

## 2024-04-01 ENCOUNTER — Ambulatory Visit: Payer: 59 | Admitting: Dermatology

## 2024-04-01 VITALS — BP 129/84

## 2024-04-01 DIAGNOSIS — L409 Psoriasis, unspecified: Secondary | ICD-10-CM

## 2024-04-01 DIAGNOSIS — L4050 Arthropathic psoriasis, unspecified: Secondary | ICD-10-CM | POA: Diagnosis not present

## 2024-04-01 MED ORDER — CALCIPOTRIENE-BETAMETH DIPROP 0.005-0.064 % EX SUSP: CUTANEOUS | 2 refills | Status: AC

## 2024-04-01 NOTE — Patient Instructions (Addendum)
 Hello Ashley [name],  Thank you for visiting today. Here is a summary of the key instructions:  Diagnosis: Psoriasis  - Medications:    - Apply Taclonex solution to affected scalp areas twice a day until clear   - Use DHS Zinc shampoo to help with itch and scale  - Over-the-counter Products:   - Purchase DHS Zinc shampoo (2% zinc) from [facility] in [place] or [place]  - Pharmacy Information:   - Taclonex will be sent to [facility] pharmacy   - [facility] will contact you about insurance and cost   - If cost is too high, contact the office for an alternative  - Follow-up:   - Return to the office in 3 months  Please reach out if you have any questions or concerns.  Warm regards,  Dr. Langston Reusing, Dermatology       Important Information  Due to recent changes in healthcare laws, you may see results of your pathology and/or laboratory studies on MyChart before the doctors have had a chance to review them. We understand that in some cases there may be results that are confusing or concerning to you. Please understand that not all results are received at the same time and often the doctors may need to interpret multiple results in order to provide you with the best plan of care or course of treatment. Therefore, we ask that you please give [place] 2 business days to thoroughly review all your results before contacting the office for clarification. Should we see a critical lab result, you will be contacted sooner.   If You Need Anything After Your Visit  If you have any questions or concerns for your doctor, please call our main line at [phone] If no one answers, please leave a voicemail as directed and we will return your call as soon as possible. Messages left after 4 pm will be answered the following business day.   You may also send [place] a message via MyChart. We typically respond to MyChart messages within 1-2 business days.  For prescription refills, please ask  your pharmacy to contact our office. Our fax number is [phone].  If you have an urgent issue when the clinic is closed that cannot wait until the next business day, you can page your doctor at the number below.    Please note that while we do our best to be available for urgent issues outside of office hours, we are not available 24/7.   If you have an urgent issue and are unable to reach [place], you may choose to seek medical care at your doctor's office, retail clinic, urgent care center, or emergency room.  If you have a medical emergency, please immediately call 911 or go to the emergency department. In the event of inclement weather, please call our main line at [phone] for an update on the status of any delays or closures.  Dermatology Medication Tips: Please keep the boxes that topical medications come in in order to help keep track of the instructions about where and how to use these. Pharmacies typically print the medication instructions only on the boxes and not directly on the medication tubes.   If your medication is too expensive, please contact our office at [phone] or send [place] a message through MyChart.   We are unable to tell what your co-pay for medications will be in advance as this is different depending on your insurance coverage. However, we may be able to find a substitute  medication at lower cost or fill out paperwork to get insurance to cover a needed medication.   If a prior authorization is required to get your medication covered by your insurance company, please allow [place] 1-2 business days to complete this process.  Drug prices often vary depending on where the prescription is filled and some pharmacies may offer cheaper prices.  The website www.goodrx.com contains coupons for medications through different pharmacies. The prices here do not account for what the cost may be with help from insurance (it may be cheaper with your insurance), but the website can  give you the price if you did not use any insurance.  - You can print the associated coupon and take it with your prescription to the pharmacy.  - You may also stop by our office during regular business hours and pick up a GoodRx coupon card.  - If you need your prescription sent electronically to a different pharmacy, notify our office through [facility] or by phone at [phone]

## 2024-04-01 NOTE — Progress Notes (Signed)
   New Patient Visit   Subjective  Ashley [name] is a 58 y.o. female who presents for the following: New Pt - Psoriasis  The patient presents with a one-year history of scalp issues, including itching and burning. She has tried [employer] and clobetasol drops without relief. The clobetasol provides temporary relief but symptoms quickly return. The most pruritic area is on the occiput, but the affected area can extend upwards. The patient reports associated joint pain, which is worse in the morning, affecting her hands and elbows. She also notes a plaque on one elbow. The patient's medical history is significant for diabetes and hypercholesterolemia.  Medical History - Psoriasis (scalp and elbow) - Psoriatic arthritis - Diabetes - Hypercholesterolemia  Current and Past Medications and Supplements - Selsun Blue (previously tried) - Clobetasol drops (previously tried)  Social History - Occupation: employed, Community education officer through work  Review of Systems - Integumentary: Scalp itching and burning - Musculoskeletal: Joint pain, worse in the morning; pain in hands and elbows The following portions of the chart were reviewed this encounter and updated as appropriate: medications, allergies, medical history  Review of Systems:  No other skin or systemic complaints except as noted in HPI or Assessment and Plan.  Objective  Well appearing patient in no apparent distress; mood and affect are within normal limits.   A focused examination was performed of the following areas: scalp   Relevant exam findings are noted in the Assessment and Plan.    Assessment & Plan   PSORIASIS and Psoriatic Arthritis Exam: Well-demarcated erythematous papules/plaques with silvery scale, guttate pink scaly papules. Located at scalp and elbows. 10% BSA.  flared  patient denies joint pain  Pt Education Discussed During Visit: Psoriasis is a chronic non-curable, but treatable genetic/hereditary disease that  may have other systemic features affecting other organ systems such as joints (Psoriatic Arthritis). It is associated with an increased risk of inflammatory bowel disease, heart disease, non-alcoholic fatty liver disease, and depression.  Treatments include light and laser treatments; topical medications; and systemic medications including oral and injectables.  - Assessment: One-year history of scalp itching and burning, unresponsive to [facility] and clobetasol drops. Examination reveals a red erythematous scaly plaque with thick micaceous scale on the occiput, consistent with psoriasis. Patient reports joint pain, worse in the morning, affecting hands and elbows, indicative of psoriatic arthritis. A small plaque noted on one elbow. Condition described as stubborn and inflammatory, affecting both skin and joints.  - Plan:    Prescribe Taclonex solution (betamethasone and calcipotriene) to be applied to affected areas and scalp twice daily until clear    Recommend over-the-counter DHS Zinc shampoo (2% zinc) for itch and scale management    Taclonex to be dispensed through [facility] specialty pharmacy due to cost considerations    Follow-up appointment in 3 months    If topical treatment ineffective, consider injections or oral medications at follow-up   No follow-ups on file.    Documentation: I have reviewed the above documentation for accuracy and completeness, and I agree with the above.  I, Shirron Marcha Solders, CMA, am acting as scribe for [employer], DO.   Langston Reusing, DO

## 2024-04-23 ENCOUNTER — Ambulatory Visit: Payer: 59 | Admitting: Family Medicine

## 2024-06-02 ENCOUNTER — Other Ambulatory Visit: Payer: Self-pay | Admitting: Family Medicine

## 2024-06-02 DIAGNOSIS — F411 Generalized anxiety disorder: Secondary | ICD-10-CM

## 2024-06-03 NOTE — Telephone Encounter (Signed)
Please make appt

## 2024-06-09 ENCOUNTER — Encounter: Payer: Self-pay | Admitting: Family Medicine

## 2024-06-10 ENCOUNTER — Ambulatory Visit (INDEPENDENT_AMBULATORY_CARE_PROVIDER_SITE_OTHER): Admitting: Family Medicine

## 2024-06-10 ENCOUNTER — Encounter: Payer: Self-pay | Admitting: Family Medicine

## 2024-06-10 VITALS — BP 176/79 | HR 71 | Wt 134.2 lb

## 2024-06-10 DIAGNOSIS — Z794 Long term (current) use of insulin: Secondary | ICD-10-CM

## 2024-06-10 DIAGNOSIS — F411 Generalized anxiety disorder: Secondary | ICD-10-CM | POA: Diagnosis not present

## 2024-06-10 DIAGNOSIS — E785 Hyperlipidemia, unspecified: Secondary | ICD-10-CM | POA: Diagnosis not present

## 2024-06-10 DIAGNOSIS — I10 Essential (primary) hypertension: Secondary | ICD-10-CM

## 2024-06-10 DIAGNOSIS — E118 Type 2 diabetes mellitus with unspecified complications: Secondary | ICD-10-CM

## 2024-06-10 LAB — POCT URINALYSIS DIP (CLINITEK)
Bilirubin, UA: NEGATIVE
Blood, UA: NEGATIVE
Glucose, UA: NEGATIVE mg/dL
Ketones, POC UA: NEGATIVE mg/dL
Nitrite, UA: NEGATIVE
POC PROTEIN,UA: NEGATIVE
Spec Grav, UA: 1.015 (ref 1.010–1.025)
Urobilinogen, UA: 0.2 U/dL
pH, UA: 6.5 (ref 5.0–8.0)

## 2024-06-10 LAB — POCT GLYCOSYLATED HEMOGLOBIN (HGB A1C): HbA1c, POC (controlled diabetic range): 7.9 % — AB (ref 0.0–7.0)

## 2024-06-10 MED ORDER — ATORVASTATIN CALCIUM 80 MG PO TABS: 80.0000 mg | ORAL_TABLET | Freq: Every day | ORAL | 3 refills | Status: AC

## 2024-06-10 MED ORDER — IRBESARTAN 300 MG PO TABS: ORAL_TABLET | ORAL | 1 refills | Status: DC

## 2024-06-10 MED ORDER — SEMAGLUTIDE (2 MG/DOSE) 8 MG/3ML ~~LOC~~ SOPN: 2.0000 mg | PEN_INJECTOR | SUBCUTANEOUS | 1 refills | Status: AC

## 2024-06-10 MED ORDER — INSULIN GLARGINE-YFGN 100 UNIT/ML ~~LOC~~ SOPN: 42.0000 [IU] | PEN_INJECTOR | Freq: Every day | SUBCUTANEOUS | 2 refills | Status: AC

## 2024-06-10 NOTE — Progress Notes (Signed)
 Established Patient Office Visit  Subjective    Patient ID: Ashley [name], female    DOB: [birth year]  Age: 58 y.o. MRN: [id number]  CC:  Chief Complaint  Patient presents with   Medical Management of Chronic Issues   Medication Refill    Remeron      HPI Ashley [name] presents for routine follow up of chronic med issues including diabetes and hypertension. Patient reports she has not been taking her BP meds and denies acute complaints.   Outpatient Encounter Medications as of 06/10/2024  Medication Sig   BD PEN NEEDLE NANO 2ND GEN 32G X 4 MM MISC Inject into the skin 4 (four) times daily.   calcipotriene -betamethasone (TACLONEX) external suspension apply to affected areas of the scalp daily twice a day until clear   clobetasol  (TEMOVATE ) 0.05 % external solution Apply 1 Application topically 2 (two) times daily.   Continuous Blood Gluc Receiver (FREESTYLE LIBRE 2 READER) DEVI 1 Act by Does not apply route daily.   Continuous Glucose Sensor (FREESTYLE LIBRE 2 SENSOR) MISC USE TO CHECK BLOOD SUGAR DAILY   Insulin  Pen Needle 32G X 6 MM MISC Inject 1 Act into the skin 4 (four) times daily. Use daily as directed   Lancet Devices (ADJUSTABLE LANCING DEVICE) MISC Up to tid testing   mirtazapine  (REMERON ) 15 MG tablet TAKE 1 TABLET(15 MG) BY MOUTH AT BEDTIME   promethazine  (PHENERGAN ) 25 MG tablet TAKE 1 TABLET(25 MG) BY MOUTH EVERY 8 HOURS AS NEEDED FOR NAUSEA OR VOMITING   traZODone  (DESYREL ) 150 MG tablet TAKE 1 TABLET(150 MG) BY MOUTH AT BEDTIME   [DISCONTINUED] atorvastatin  (LIPITOR) 80 MG tablet Take 1 tablet (80 mg total) by mouth daily.   [DISCONTINUED] insulin  glargine-yfgn (SEMGLEE , YFGN,) 100 UNIT/ML Pen Inject 42 Units into the skin daily.   [DISCONTINUED] irbesartan  (AVAPRO ) 300 MG tablet TAKE 1 TABLET(300 MG) BY MOUTH DAILY   [DISCONTINUED] Semaglutide , 2 MG/DOSE, 8 MG/3ML SOPN Inject 2 mg as directed once a week.   atorvastatin  (LIPITOR) 80 MG tablet Take 1 tablet (80 mg total) by  mouth daily.   insulin  glargine-yfgn (SEMGLEE , YFGN,) 100 UNIT/ML Pen Inject 42 Units into the skin daily.   irbesartan  (AVAPRO ) 300 MG tablet TAKE 1 TABLET(300 MG) BY MOUTH DAILY   Semaglutide , 2 MG/DOSE, 8 MG/3ML SOPN Inject 2 mg as directed once a week.   No facility-administered encounter medications on file as of 06/10/2024.    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 Kirke Pepper, 07/2014   GERD (gastroesophageal reflux disease)    H/O exercise stress test 2011   done at [facility]- told that it was WNL, done due to pt. having panic attacks    History of blood transfusion [birth year]1966   at birth in [place], [place], unsure number of units   PONV (postoperative nausea and vomiting)    Poor dentition    very poor oral health    SVD (spontaneous vaginal delivery)  x 3   Tobacco abuse    Wears glasses     Past Surgical History:  Procedure Laterality Date   ABDOMINAL HYSTERECTOMY     APPENDECTOMY  1988   BREAST LUMPECTOMY  2021   BREAST LUMPECTOMY WITH RADIOACTIVE SEED LOCALIZATION Left 03/17/2020   Procedure: LEFT BREAST LUMPECTOMY WITH RADIOACTIVE SEED LOCALIZATION;  Surgeon: Oza Blumenthal, MD;  Location: [facility] OR;  Service: General;  Laterality: Left;  LMA   CARDIAC CATHETERIZATION N/A 08/17/2016   Procedure: Left Heart Cath and Coronary Angiography;  Surgeon: Odie Benne, MD;  Location: [facility] INVASIVE CV LAB;  Service: Cardiovascular;  Laterality: N/A;   CHOLECYSTECTOMY N/A 06/07/2014   Procedure: LAPAROSCOPIC CHOLECYSTECTOMY;   Surgeon: Shela Derby, MD;  Location: [facility] OR;  Service: General;  Laterality: N/A;   CHOLECYSTECTOMY  06/07/2014   COLONOSCOPY  02/16/2014   normal    ESOPHAGOGASTRODUODENOSCOPY  12/28/2013   KNEE ARTHROSCOPY  1995   left   LAPAROSCOPIC ASSISTED VAGINAL HYSTERECTOMY N/A 04/15/2014   Procedure: LAPAROSCOPIC ASSISTED VAGINAL HYSTERECTOMY;  Surgeon: Ashby Lawman, MD;  Location: [facility] ORS;  Service: Gynecology;  Laterality: N/A;   LAPAROSCOPIC BILATERAL SALPINGO OOPHERECTOMY Bilateral 04/15/2014   Procedure: LAPAROSCOPIC BILATERAL SALPINGO OOPHORECTOMY;  Surgeon: Ashby Lawman, MD;  Location: [facility] ORS;  Service: Gynecology;  Laterality: Bilateral;   TUBAL LIGATION      Family History  Problem Relation Age of Onset   Diabetes Mother    Hyperlipidemia Mother    Hypertension Mother    Anxiety disorder Mother    Depression Mother    Drug abuse Mother    Stroke Mother        x3   Prostate cancer Father    Alcohol abuse Father    Lung cancer Maternal Grandfather        smoked   Emphysema Maternal Grandfather        smoked   COPD Maternal Grandmother        never smoked    Dementia Maternal Grandmother    Thyroid  cancer Paternal Aunt    Colon cancer Neg Hx    Rectal cancer Neg Hx    Stomach cancer Neg Hx    Migraines Neg Hx     Social History   Socioeconomic History   Marital status: Married    Spouse name: Dean Every   Number of children: 6   Years of education: 12   Highest education level: Not on file  Occupational History   Occupation:      Comment: helps run a friends business  Tobacco Use   Smoking status: Every Day    Current packs/day: 2.00    Average packs/day: 2.0 packs/day for 35.0 years (70.0 ttl pk-yrs)    Types: Cigarettes   Smokeless tobacco: Never  Vaping Use   Vaping status: Never Used  Substance and Sexual Activity   Alcohol use: No    Alcohol/week: 0.0 standard drinks of alcohol   Drug use: No   Sexual activity: Yes    Partners: Male    Birth  control/protection: Surgical  Other Topics Concern   Not on file  Social History Narrative   5 living children, one child is a crack cocaine addict who often breaks into their house to steal money. He is currently in jail. One child died of cerebral palsy.   Caffeine use: Dr Kathlene Paradise (3 per day)   2 cups coffee per day   Online student, currently not working   Social Drivers of [employer]  Resource Strain: Low Risk  (09/24/2023)   Overall Financial Resource Strain (CARDIA)    Difficulty of Paying Living Expenses: Not very hard  Food Insecurity: No Food Insecurity (01/25/2021)   Received from [facility]   Hunger Vital Sign    Within the past 12 months, you worried that your food would run out before you got the money to buy more.: Never true    Within the past 12 months, the food you bought just didn't last and you didn't have money to get more.: Never true  Transportation Needs: No Transportation Needs (09/24/2023)   PRAPARE - Administrator, Civil Service (Medical): No    Lack of Transportation (Non-Medical): No  Physical Activity: Insufficiently Active (09/24/2023)   Exercise Vital Sign    Days of Exercise per Week: 3 days    Minutes of Exercise per Session: 30 min  Stress: No Stress Concern Present (09/24/2023)   [employer] of Occupational Health - Occupational Stress Questionnaire    Feeling of Stress : Not at all  Social Connections: Unknown (09/24/2023)   Social Connection and Isolation Panel    Frequency of Communication with Friends and Family: More than three times a week    Frequency of Social Gatherings with Friends and Family: More than three times a week    Attends Religious Services: More than 4 times per year    Active Member of [employer] or Organizations: Yes    Attends Banker Meetings: 1 to 4 times per year    Marital Status: Not on file  Intimate Partner Violence: Not At Risk (09/24/2023)   Humiliation, Afraid, Rape, and Kick  questionnaire    Fear of Current or Ex-Partner: No    Emotionally Abused: No    Physically Abused: No    Sexually Abused: No    Review of Systems  All other systems reviewed and are negative.       Objective    BP (!) 176/79 (BP Location: Right Arm, Patient Position: Sitting, Cuff Size: Normal)   Pulse 71   Wt 134 lb 3.2 oz (60.9 kg)   LMP 02/12/2014   SpO2 98%   BMI 24.55 kg/m   Physical Exam Vitals and nursing note reviewed.  Constitutional:      General: She is not in acute distress.  Cardiovascular:     Rate and Rhythm: Normal rate and regular rhythm.  Pulmonary:     Effort: Pulmonary effort is normal.     Breath sounds: Normal breath sounds.  Abdominal:     Palpations: Abdomen is soft.     Tenderness: There is no abdominal tenderness.   Neurological:     General: No focal deficit present.     Mental Status: She is alert and oriented to person, place, and time.   Psychiatric:        Mood and Affect: Mood normal.        Behavior: Behavior normal.         Assessment & Plan:   1. Diabetes mellitus with complication (HCC) (Primary) Improved A1c but not yet at goal. Increase Insulin  from 36 -40 units daily  - POCT glycosylated hemoglobin (Hb A1C) - POCT URINALYSIS DIP (CLINITEK)  2. Essential hypertension Discussed compliance. Meds refilled. continue - irbesartan  (AVAPRO ) 300 MG tablet; TAKE 1 TABLET(300 MG) BY MOUTH DAILY  Dispense: 90 tablet; Refill: 1  3. Anxiety state Continue remeron .   4. Hyperlipidemia, unspecified hyperlipidemia type Meds refilled. Continue  Return in about 3 months (around 09/10/2024) for follow up, chronic med issues.   Arlo Lama, MD

## 2024-06-11 ENCOUNTER — Encounter: Payer: Self-pay | Admitting: Family Medicine

## 2024-06-15 ENCOUNTER — Other Ambulatory Visit: Payer: Self-pay | Admitting: Family Medicine

## 2024-06-29 ENCOUNTER — Ambulatory Visit (INDEPENDENT_AMBULATORY_CARE_PROVIDER_SITE_OTHER): Admitting: Dermatology

## 2024-06-29 ENCOUNTER — Encounter: Payer: Self-pay | Admitting: Dermatology

## 2024-06-29 VITALS — BP 121/76 | HR 78

## 2024-06-29 DIAGNOSIS — Z7189 Other specified counseling: Secondary | ICD-10-CM

## 2024-06-29 DIAGNOSIS — L4050 Arthropathic psoriasis, unspecified: Secondary | ICD-10-CM

## 2024-06-29 DIAGNOSIS — L409 Psoriasis, unspecified: Secondary | ICD-10-CM | POA: Diagnosis not present

## 2024-06-29 MED ORDER — OTEZLA 30 MG PO TABS: 30.0000 mg | ORAL_TABLET | Freq: Two times a day (BID) | ORAL | 8 refills | Status: AC

## 2024-06-29 NOTE — Patient Instructions (Addendum)
 Date: Mon Jun 29 2024  Hello Teela,  Thank you for visiting today. Here is a summary of the key instructions:  - Medications:   - Continue using Taclonex on affected areas until Otezla  takes effect   - Start Otezla  (starter packet provided):     - Follow titration pack instructions     - Take up to 30 mg twice a day when fully titrated     - Take with Imodium or Pepto-Bismol to prevent diarrhea  - Side Effects:   - Watch for stomach upset or diarrhea in first 2-3 weeks   - If depression worsens, stop Otezla  and call the office  - Follow-up: Return to the office in 4 months  - Instructions:   - Scan QR code to activate patient assistance program for Otezla    - Use MyChart for any questions   - If Otezla  samples run out before insurance approval, call for more  - Pharmacy:   - Specialty pharmacy ([facility]) will process Otezla  prescription   - They will contact your insurance and mail the medication to you  We look forward to seeing you at your next visit. If you have any questions or concerns before then, please do not hesitate to contact our office.  Warm regards,  Dr. [facility]on [facility]   Important Information  Due to recent changes in healthcare laws, you may see results of your pathology and/or laboratory studies on MyChart before the doctors have had a chance to review them. We understand that in some cases there may be results that are confusing or concerning to you. Please understand that not all results are received at the same time and often the doctors may need to interpret multiple results in order to provide you with the best plan of care or course of treatment. Therefore, we ask that you please give us  2 business days to thoroughly review all your results before contacting the office for clarification. Should we see a critical lab result, you will be contacted sooner.   If You Need Anything After Your Visit  If you have any questions or concerns for your  doctor, please call our main line at [phone] If no one answers, please leave a voicemail as directed and we will return your call as soon as possible. Messages left after 4 pm will be answered the following business day.   You may also send us  a message via MyChart. We typically respond to MyChart messages within 1-2 business days.  For prescription refills, please ask your pharmacy to contact our office. Our fax number is [phone].  If you have an urgent issue when the clinic is closed that cannot wait until the next business day, you can page your doctor at the number below.    Please note that while we do our best to be available for urgent issues outside of office hours, we are not available 24/7.   If you have an urgent issue and are unable to reach us , you may choose to seek medical care at your doctor's office, retail clinic, urgent care center, or emergency room.  If you have a medical emergency, please immediately call 911 or go to the emergency department. In the event of inclement weather, please call our main line at [phone] for an update on the status of any delays or closures.  Dermatology Medication Tips: Please keep the boxes that topical medications come in in order to help keep track of the instructions about where and how  to use these. Pharmacies typically print the medication instructions only on the boxes and not directly on the medication tubes.   If your medication is too expensive, please contact our office at [phone] or send us  a message through MyChart.   We are unable to tell what your co-pay for medications will be in advance as this is different depending on your insurance coverage. However, we may be able to find a substitute medication at lower cost or fill out paperwork to get insurance to cover a needed medication.   If a prior authorization is required to get your medication covered by your insurance company, please allow us  1-2 business days  to complete this process.  Drug prices often vary depending on where the prescription is filled and some pharmacies may offer cheaper prices.  The website www.goodrx.com contains coupons for medications through different pharmacies. The prices here do not account for what the cost may be with help from insurance (it may be cheaper with your insurance), but the website can give you the price if you did not use any insurance.  - You can print the associated coupon and take it with your prescription to the pharmacy.  - You may also stop by our office during regular business hours and pick up a GoodRx coupon card.  - If you need your prescription sent electronically to a different pharmacy, notify our office through [facility] or by phone at [phone]

## 2024-06-29 NOTE — Progress Notes (Addendum)
 Follow-Up Visit   Subjective  Ashley [name] is a 58 y.o. female who presents for the following: Here to follow up for treatment of psoriasis.   The following portions of the chart were reviewed this encounter and updated as appropriate: medications, allergies, medical history  Review of Systems:  No other skin or systemic complaints except as noted in HPI or Assessment and Plan.  Objective  Well appearing patient in no apparent distress; mood and affect are within normal limits.   A focused examination was performed of the following areas: scalp  Relevant exam findings are noted in the Assessment and Plan.    Assessment & Plan   PSORIASIS on Systemic Treatment Exam: Well-demarcated erythematous papules/plaques with silvery scale, guttate pink scaly papules. 10% BSA. IGA: 3  flared  patient c/o  joint pain  - Assessment:  Patient presents with psoriasis affecting the scalp and elbow, not improving with Taclonex. Erythematous plaque with micaceous scale involving the occiput and elbow area. Current treatment with Taclonex has not been effective. Patient is open to oral medication and has no contraindications based on current medications (cholesterol, blood pressure, and diabetic medicines, as well as Remeron  for anxiety). Otezla  is considered a suitable option as it is not a biologic, does not suppress the immune system, and works well for scalp psoriasis.  - Plan:    Initiate Otezla  (apremilast ) oral medication     - Provide starter packet today     - Titration pack: starting at low dose, working up to 30 mg twice daily     - Patient education provided on potential side effects, including GI upset and diarrhea in first 2-3 weeks     - Advised to take Imodium or Pepto-Bismol initially to prevent diarrhea     - Informed patient that being on Ozempic  may affect medicine's duration in system     - Warned about rare possibility of worsening depression; instructed to stop  medication and call if noticed     - Expected timeline: noticeable improvements every 4 weeks, full effect in 3-4 months    Continue Taclonex topically until Otezla  takes effect    Arrange for specialty pharmacy [facility]) to process Otezla  prescription, communicate with insurance, and mail medication    Provide patient with QR code to activate patient assistance program for potential co-pay reduction    Follow-up appointment in 4 months    Instruct patient to message via MyChart for any questions  Patient Education Discussed During Visit: Psoriasis - severe on systemic treatment.  Psoriasis is a chronic non-curable, but treatable genetic/hereditary disease that may have other systemic features affecting other organ systems such as joints (Psoriatic Arthritis).  It is linked with heart disease, inflammatory bowel disease, non-alcoholic fatty liver disease, and depression. Significant skin psoriasis and/or psoriatic arthritis may have significant symptoms and affects activities of daily activity and often benefits from systemic treatments.  These systemic treatments have some potential side effects including immunosuppression and require pre-treatment laboratory screening and periodic laboratory monitoring and periodic in person evaluation and monitoring by the attending dermatologist physician (long term medication management).   Side effects of Otezla  (apremilast ) include diarrhea, nausea, headache, upper respiratory infection, depression, and weight decrease (5-10%). It should only be taken by pregnant women after a discussion regarding risks and benefits with their doctor. Goal is control of skin condition, not cure.  The use of Otezla  requires long term medication management, including periodic office visits.  Pt is not a candidate for  methotrexate or cyclosporine due to inability for follow up labs and contraindications with other medications. Pt has no access to a light box for  phototherapy.  Due to the progressive and chronic nature of her psoriasis, patient has tried and failed numerous topicals creams as noted above, the next best therapeutic option is a systemic therapy. It is medically necessary to help improve her quality of life.   Long term medication management.  Patient is using long term (months to years) prescription medication  to control their dermatologic condition.  These medications require periodic monitoring to evaluate for efficacy and side effects and may require periodic laboratory monitoring.       No follow-ups on file.  IBerwyn Lesches, Surg Tech III, am acting as scribe for [employer], DO.   Documentation: I have reviewed the above documentation for accuracy and completeness, and I agree with the above.  Delon Lenis, DO

## 2024-07-22 ENCOUNTER — Other Ambulatory Visit: Payer: Self-pay | Admitting: Family Medicine

## 2024-07-22 DIAGNOSIS — E118 Type 2 diabetes mellitus with unspecified complications: Secondary | ICD-10-CM

## 2024-07-28 ENCOUNTER — Other Ambulatory Visit: Payer: Self-pay

## 2024-07-29 ENCOUNTER — Other Ambulatory Visit: Payer: Self-pay

## 2024-08-21 ENCOUNTER — Encounter: Payer: Self-pay | Admitting: Physician Assistant

## 2024-08-29 ENCOUNTER — Other Ambulatory Visit: Payer: Self-pay | Admitting: Physician Assistant

## 2024-08-29 DIAGNOSIS — F411 Generalized anxiety disorder: Secondary | ICD-10-CM

## 2024-09-13 ENCOUNTER — Other Ambulatory Visit: Payer: Self-pay | Admitting: Family Medicine

## 2024-09-14 ENCOUNTER — Ambulatory Visit: Admitting: Family Medicine

## 2024-09-25 ENCOUNTER — Ambulatory Visit (INDEPENDENT_AMBULATORY_CARE_PROVIDER_SITE_OTHER)

## 2024-09-25 ENCOUNTER — Ambulatory Visit (HOSPITAL_COMMUNITY)
Admission: EM | Admit: 2024-09-25 | Discharge: 2024-09-25 | Disposition: A | Discharge status: Home / Self Care | Attending: Physician Assistant | Admitting: Physician Assistant

## 2024-09-25 ENCOUNTER — Encounter (HOSPITAL_COMMUNITY): Payer: Self-pay

## 2024-09-25 DIAGNOSIS — J441 Chronic obstructive pulmonary disease with (acute) exacerbation: Secondary | ICD-10-CM

## 2024-09-25 DIAGNOSIS — R051 Acute cough: Secondary | ICD-10-CM

## 2024-09-25 MED ORDER — ALBUTEROL SULFATE HFA 108 (90 BASE) MCG/ACT IN AERS
2.0000 | INHALATION_SPRAY | Freq: Once | RESPIRATORY_TRACT | Status: AC
  Administered 2024-09-25: 2 via RESPIRATORY_TRACT

## 2024-09-25 MED ORDER — DOXYCYCLINE HYCLATE 100 MG PO CAPS
100.0000 mg | ORAL_CAPSULE | Freq: Two times a day (BID) | ORAL | 0 refills | Status: AC
Start: 2024-09-25 — End: ?

## 2024-09-25 MED ORDER — ALBUTEROL SULFATE HFA 108 (90 BASE) MCG/ACT IN AERS
INHALATION_SPRAY | RESPIRATORY_TRACT | Status: AC
  Filled 2024-09-25: qty 6.7

## 2024-09-25 MED ORDER — PREDNISONE 20 MG PO TABS: 20.0000 mg | ORAL_TABLET | Freq: Every day | ORAL | 0 refills | Status: AC

## 2024-09-25 MED ORDER — BENZONATATE 100 MG PO CAPS: 100.0000 mg | ORAL_CAPSULE | Freq: Three times a day (TID) | ORAL | 0 refills | Status: AC

## 2024-09-25 NOTE — Discharge Instructions (Signed)
 Your x-ray did not show any evidence of pneumonia.  We are treating you for a COPD exacerbation.  Use albuterol  every 4-6 hours as needed.  Start prednisone  20 mg for 5 days.  This can raise your blood sugars and make sure that you are drinking plenty of fluid and avoid carbohydrates.  If you end up with very high blood sugar, shortness of breath, nausea, vomiting you need to be seen immediately.  Start doxycycline  100 mg.  Stay out of the sun while on this medication as it can cause you to have a bad sunburn.  Use Mucinex, Flonase , nasal saline/sinus rinses for additional symptom relief.  I have called in Tessalon  to help with your cough.  If you are not feeling better in a week or if anything worsens please return for reevaluation.

## 2024-09-25 NOTE — ED Triage Notes (Signed)
 Pt c/o coughing x2wks with SOB and pain to center back. NAD, speaking in complete sentences. States taken nyquil with relief.

## 2024-09-25 NOTE — ED Provider Notes (Signed)
 [facility]    CSN: [id number] Arrival date & time: 09/25/24  1221      History   Chief Complaint Chief Complaint  Patient presents with   Cough   Shortness of Breath    HPI Ashley [name] is a 58 y.o. female.   HPI  Past Medical History:  Diagnosis Date   Anxiety    Atypical ductal hyperplasia of left breast    Bronchitis    uses inhaler if needed for bronchitis, lasted used -2014   Cataract    bilateral   Chronic headaches    migraines - in past, uses Phenergan  for nausea    COPD (chronic obstructive pulmonary disease) (HCC)    Deficiency anemia 12/30/2017   Depression    Diabetes mellitus without complication (HCC)    taken off Metformin  since 04/2014, HgbA1C - normal, will follow up with PCP- Dr. Jenetta, 07/2014   GERD (gastroesophageal reflux disease)    H/O exercise stress test 2011   done at [facility]- told that it was WNL, done due to pt. having panic attacks    History of blood transfusion 04/30/1966   at birth in [name], [place], unsure number of units   PONV (postoperative nausea and vomiting)    Poor dentition    very poor oral health    SVD (spontaneous vaginal delivery)    x 3   Tobacco abuse    Wears glasses     Patient Active Problem List   Diagnosis Date Noted   Subacute maxillary sinusitis 02/13/2022   Estrogen deficiency 01/11/2022   Insulin -requiring or dependent type II diabetes mellitus (HCC) 11/24/2021   Hypokalemia 04/13/2021   Cognitive communication deficit 04/12/2021   Other abnormal auditory perceptions, bilateral 04/11/2021   Idiopathic peripheral neuropathy 04/05/2021   Hereditary and idiopathic peripheral neuropathy 04/05/2021   Unspecified convulsions (HCC) 04/04/2021   Repeated falls 04/04/2021   Syncope and collapse 04/04/2021   Other somatoform disorders 04/04/2021   Other specified mental disorders due to known physiological condition 04/04/2021   Stroke (cerebrum) (HCC) 03/20/2021   Other long term (current) drug  therapy 12/24/2020   Long term (current) use of insulin  (HCC) 12/24/2020   Type 2 diabetes mellitus without complications (HCC) 12/24/2020   Iron deficiency anemia, unspecified 12/24/2020   Hyperlipidemia, unspecified 12/24/2020   Gastroesophageal reflux disease without esophagitis 12/24/2020   Family history of cerebral aneurysm 09/28/2020   Autonomic dysfunction 08/22/2020   Paroxysmal SVT (supraventricular tachycardia) 07/25/2020   Menopausal vaginal dryness 07/25/2020   Hot flashes, menopausal 11/10/2019   Bilateral hearing loss 11/10/2019   Dietary iron deficiency 09/01/2019   Lumbar radiculitis 02/10/2019   Gastroesophageal reflux disease with esophagitis 02/10/2019   Essential hypertension 10/20/2018   Chronic migraine without aura, with intractable migraine, so stated, with status migrainosus 07/02/2018   Thiamine  deficiency 01/05/2018   Moderate episode of recurrent major depressive disorder (HCC) 05/16/2017   Diabetes mellitus with complication (HCC)    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   Tobacco abuse 01/08/2017   Chronic bronchitis (HCC) 10/20/2015   Dyslipidemia, goal LDL below 70 04/12/2015   Low back pain 09/28/2014   Migraine with aura, intractable 08/12/2014   Routine general medical examination at a health care facility 05/24/2011    Past Surgical History:  Procedure Laterality Date   ABDOMINAL HYSTERECTOMY     APPENDECTOMY  1988   BREAST LUMPECTOMY  2021   BREAST LUMPECTOMY WITH RADIOACTIVE SEED LOCALIZATION Left 03/17/2020   Procedure: LEFT BREAST LUMPECTOMY WITH RADIOACTIVE  SEED LOCALIZATION;  Surgeon: Vernetta Berg, MD;  Location: [facility] OR;  Service: General;  Laterality: Left;  LMA   CARDIAC CATHETERIZATION N/A 08/17/2016   Procedure: Left Heart Cath and Coronary Angiography;  Surgeon: Lonni JONETTA Cash, MD;  Location: [facility] INVASIVE CV LAB;  Service: Cardiovascular;  Laterality: N/A;   CHOLECYSTECTOMY N/A 06/07/2014   Procedure: LAPAROSCOPIC  CHOLECYSTECTOMY;  Surgeon: Lynda Leos, MD;  Location: [facility] OR;  Service: General;  Laterality: N/A;   CHOLECYSTECTOMY  06/07/2014   COLONOSCOPY  02/16/2014   normal    ESOPHAGOGASTRODUODENOSCOPY  12/28/2013   KNEE ARTHROSCOPY  1995   left   LAPAROSCOPIC ASSISTED VAGINAL HYSTERECTOMY N/A 04/15/2014   Procedure: LAPAROSCOPIC ASSISTED VAGINAL HYSTERECTOMY;  Surgeon: Truman Corona, MD;  Location: [facility] ORS;  Service: Gynecology;  Laterality: N/A;   LAPAROSCOPIC BILATERAL SALPINGO OOPHERECTOMY Bilateral 04/15/2014   Procedure: LAPAROSCOPIC BILATERAL SALPINGO OOPHORECTOMY;  Surgeon: Truman Corona, MD;  Location: [facility] ORS;  Service: Gynecology;  Laterality: Bilateral;   TUBAL LIGATION      OB History   No obstetric history on file.      Home Medications    Prior to Admission medications   Medication Sig Start Date End Date Taking? Authorizing Provider  benzonatate  (TESSALON ) 100 MG capsule Take 1 capsule (100 mg total) by mouth every 8 (eight) hours. 09/25/24  Yes Pearlina Friedly K, PA-C  doxycycline  (VIBRAMYCIN ) 100 MG capsule Take 1 capsule (100 mg total) by mouth 2 (two) times daily. 09/25/24  Yes Makenize Messman K, PA-C  predniSONE  (DELTASONE ) 20 MG tablet Take 1 tablet (20 mg total) by mouth daily for 5 days. 09/25/24 09/30/24 Yes Tiahna Cure K, PA-C  Apremilast  (OTEZLA ) 30 MG TABS Take 1 tablet (30 mg total) by mouth 2 (two) times daily. 06/29/24   Alm Delon SAILOR, DO  atorvastatin  (LIPITOR) 80 MG tablet Take 1 tablet (80 mg total) by mouth daily. 06/10/24   Tanda Bleacher, MD  BD PEN NEEDLE NANO 2ND GEN 32G X 4 MM MISC Inject into the skin 4 (four) times daily. 03/19/23   [provider]  calcipotriene -betamethasone (TACLONEX) external suspension apply to affected areas of the scalp daily twice a day until clear 04/01/24   Alm Delon SAILOR, DO  clobetasol  (TEMOVATE ) 0.05 % external solution Apply 1 Application topically 2 (two) times daily. 09/24/23   Tanda Bleacher, MD  Continuous  Glucose Receiver (FREESTYLE LIBRE 2 READER) DEVI USE TO CHECK BLOOD SUGAR AS DIRECTED 07/23/24   Tanda Bleacher, MD  Continuous Glucose Sensor (FREESTYLE LIBRE 2 SENSOR) MISC USE TO CHECK BLOOD SUGAR DAILY 02/04/24   Tanda Bleacher, MD  insulin  glargine-yfgn (SEMGLEE , YFGN,) 100 UNIT/ML Pen Inject 42 Units into the skin daily. 06/10/24   Tanda Bleacher, MD  Insulin  Pen Needle 32G X 6 MM MISC Inject 1 Act into the skin 4 (four) times daily. Use daily as directed 03/18/23   Tanda Bleacher, MD  irbesartan  (AVAPRO ) 300 MG tablet TAKE 1 TABLET(300 MG) BY MOUTH DAILY 06/10/24   Tanda Bleacher, MD  Lancet Devices (ADJUSTABLE LANCING DEVICE) MISC Up to tid testing 09/19/17   [provider]  mirtazapine  (REMERON ) 15 MG tablet TAKE 1 TABLET(15 MG) BY MOUTH AT BEDTIME 09/18/24   Tanda Bleacher, MD  Semaglutide , 2 MG/DOSE, 8 MG/3ML SOPN Inject 2 mg as directed once a week. 06/10/24   Tanda Bleacher, MD  traZODone  (DESYREL ) 150 MG tablet TAKE 1 TABLET(150 MG) BY MOUTH AT BEDTIME 08/30/24   Newlin, Enobong, MD    Family History Family History  Problem Relation Age of Onset   Diabetes Mother    Hyperlipidemia Mother    Hypertension Mother    Anxiety disorder Mother    Depression Mother    Drug abuse Mother    Stroke Mother        x3   Prostate cancer Father    Alcohol abuse Father    Lung cancer Maternal Grandfather        smoked   Emphysema Maternal Grandfather        smoked   COPD Maternal Grandmother        never smoked    Dementia Maternal Grandmother    Thyroid  cancer Paternal Aunt    Colon cancer Neg Hx    Rectal cancer Neg Hx    Stomach cancer Neg Hx    Migraines Neg Hx     Social History Social History   Tobacco Use   Smoking status: Every Day    Current packs/day: 2.00    Average packs/day: 2.0 packs/day for 35.0 years (70.0 ttl pk-yrs)    Types: Cigarettes   Smokeless tobacco: Never  Vaping Use   Vaping status: Never Used  Substance Use Topics   Alcohol use: No     Alcohol/week: 0.0 standard drinks of alcohol   Drug use: No     Allergies   Penicillins, Topamax  [topiramate ], and Metformin  and related   Review of Systems Review of Systems  Constitutional:  Positive for activity change. Negative for appetite change, fatigue and fever.  HENT:  Positive for congestion and sinus pressure. Negative for sneezing and sore throat.   Respiratory:  Positive for cough and shortness of breath.   Cardiovascular:  Negative for chest pain.  Gastrointestinal:  Negative for abdominal pain, diarrhea, nausea and vomiting.  Neurological:  Negative for dizziness, light-headedness and headaches.     Physical Exam Triage Vital Signs ED Triage Vitals  Encounter Vitals Group     BP 09/25/24 1300 (!) 161/76     Girls Systolic BP Percentile --      Girls Diastolic BP Percentile --      Boys Systolic BP Percentile --      Boys Diastolic BP Percentile --      Pulse Rate 09/25/24 1300 78     Resp 09/25/24 1300 18     Temp 09/25/24 1300 98.8 F (37.1 C)     Temp Source 09/25/24 1300 Oral     SpO2 09/25/24 1300 95 %     Weight --      Height --      Head Circumference --      Peak Flow --      Pain Score 09/25/24 1259 3     Pain Loc --      Pain Education --      Exclude from Growth Chart --    No data found.  Updated Vital Signs BP (!) 161/76 (BP Location: Right Arm)   Pulse 78   Temp 98.8 F (37.1 C) (Oral)   Resp 18   LMP 02/12/2014   SpO2 95%   Visual Acuity Right Eye Distance:   Left Eye Distance:   Bilateral Distance:    Right Eye Near:   Left Eye Near:    Bilateral Near:     Physical Exam Vitals reviewed.  Constitutional:      General: She is awake. She is not in acute distress.    Appearance: Normal appearance. She is well-developed. She is not ill-appearing.  Comments: Very pleasant female presented age in no acute distress sitting comfortably in exam room  HENT:     Head: Normocephalic and atraumatic.     Right Ear: Tympanic  membrane, ear canal and external ear normal. There is impacted cerumen. Tympanic membrane is not erythematous or bulging.     Left Ear: Tympanic membrane, ear canal and external ear normal. Tympanic membrane is not erythematous or bulging.     Ears:     Comments: Right ear: Cerumen impaction noted; unable to visualize approximately 30% of TM that appears normal.    Nose:     Right Sinus: Maxillary sinus tenderness present. No frontal sinus tenderness.     Left Sinus: Maxillary sinus tenderness present. No frontal sinus tenderness.     Mouth/Throat:     Dentition: Abnormal dentition.     Pharynx: Uvula midline. No oropharyngeal exudate or posterior oropharyngeal erythema.  Cardiovascular:     Rate and Rhythm: Normal rate and regular rhythm.     Heart sounds: Normal heart sounds, S1 normal and S2 normal. No murmur heard. Pulmonary:     Effort: Pulmonary effort is normal.     Breath sounds: Examination of the left-lower field reveals wheezing. Wheezing present. No rhonchi or rales.     Comments: Wheezing resolved following albuterol  in clinic. Psychiatric:        Behavior: Behavior is cooperative.      UC Treatments / Results  Labs (all labs ordered are listed, but only abnormal results are displayed) Labs Reviewed - No data to display  EKG   Radiology DG Chest 2 View Result Date: 09/25/2024 CLINICAL DATA:  acute cough EXAM: CHEST - 2 VIEW COMPARISON:  09/26/2021 FINDINGS: No focal airspace consolidation, pleural effusion, or pneumothorax. No cardiomegaly.No acute fracture or destructive lesion. IMPRESSION: No acute cardiopulmonary abnormality. Electronically Signed   By: Rogelia Myers M.D.   On: 09/25/2024 13:50    Procedures Procedures (including critical care time)  Medications Ordered in UC Medications  albuterol  (VENTOLIN  HFA) 108 (90 Base) MCG/ACT inhaler 2 puff (2 puffs Inhalation Given 09/25/24 1326)    Initial Impression / Assessment and Plan / UC Course  I have  reviewed the triage vital signs and the nursing notes.  Pertinent labs & imaging results that were available during my care of the patient were reviewed by me and considered in my medical decision making (see chart for details).     Patient is well-appearing, afebrile, nontoxic, nontachycardic.  She did have wheezing on exam and so was given albuterol  inhaler with spacer.  She was able to use this medication successfully and felt improvement of symptoms.  She was sent home with both the medication and spacer to be used every 4-6 hours as needed.  Viral testing was deferred as she has been symptomatic for over a week and this would not change management.  Chest x-ray was obtained that showed no acute cardiopulmonary disease.  Will cover for COPD exacerbation given her increased shortness of breath and worsening cough with increase sputum production.  She has an anaphylactic reaction to penicillin so we will start on doxycycline  100 mg twice daily.  We discussed that she should avoid prolonged sun exposure while on this medication due to associated photosensitivity.  She was given for Tessalon  for cough and encouraged to use over-the-counter medicines such as Mucinex, nasal saline/sinus rinses, Flonase  for additional symptom relief.  She was also started on a low-dose prednisone  burst of 20 mg for 5 days.  We discussed that this can cause hyperglycemia given her history of diabetes; her last A1c was only slightly elevated at 7.9% on 06/10/2024.  We discussed that she should avoid carbohydrates and drink plenty fluid while on this medication she develops hyperglycemia, shortness of breath, nausea, vomiting she needs to be seen immediately.  No indication for medication dose adjustment based on metabolic panel from 02/20/2024 with creatinine of 0.82 calculated creatinine clearance of 72 mL/min.  We discussed that if she is not feeling better within a week or if anything worsens she needs to be seen immediately.   Strict return precautions given.   Final Clinical Impressions(s) / UC Diagnoses   Final diagnoses:  COPD exacerbation (HCC)  Acute cough     Discharge Instructions      Your x-ray did not show any evidence of pneumonia.  We are treating you for a COPD exacerbation.  Use albuterol  every 4-6 hours as needed.  Start prednisone  20 mg for 5 days.  This can raise your blood sugars and make sure that you are drinking plenty of fluid and avoid carbohydrates.  If you end up with very high blood sugar, shortness of breath, nausea, vomiting you need to be seen immediately.  Start doxycycline  100 mg.  Stay out of the sun while on this medication as it can cause you to have a bad sunburn.  Use Mucinex, Flonase , nasal saline/sinus rinses for additional symptom relief.  I have called in Tessalon  to help with your cough.  If you are not feeling better in a week or if anything worsens please return for reevaluation.     ED Prescriptions     Medication Sig Dispense Auth. Provider   doxycycline  (VIBRAMYCIN ) 100 MG capsule Take 1 capsule (100 mg total) by mouth 2 (two) times daily. 20 capsule Arael Piccione K, PA-C   predniSONE  (DELTASONE ) 20 MG tablet Take 1 tablet (20 mg total) by mouth daily for 5 days. 5 tablet Enslee Bibbins K, PA-C   benzonatate  (TESSALON ) 100 MG capsule Take 1 capsule (100 mg total) by mouth every 8 (eight) hours. 21 capsule Shaquasha Gerstel K, PA-C      PDMP not reviewed this encounter.   Sherrell Rocky POUR, PA-C 09/25/24 1359

## 2024-10-13 ENCOUNTER — Ambulatory Visit: Admitting: Physician Assistant

## 2024-10-29 ENCOUNTER — Encounter: Payer: Self-pay | Admitting: Family Medicine

## 2024-10-29 ENCOUNTER — Ambulatory Visit: Admitting: Family Medicine

## 2024-10-29 VITALS — BP 162/82 | HR 82 | Ht 62.0 in | Wt 124.6 lb

## 2024-10-29 DIAGNOSIS — Z7985 Long-term (current) use of injectable non-insulin antidiabetic drugs: Secondary | ICD-10-CM

## 2024-10-29 DIAGNOSIS — E785 Hyperlipidemia, unspecified: Secondary | ICD-10-CM

## 2024-10-29 DIAGNOSIS — I10 Essential (primary) hypertension: Secondary | ICD-10-CM | POA: Diagnosis not present

## 2024-10-29 DIAGNOSIS — Z794 Long term (current) use of insulin: Secondary | ICD-10-CM

## 2024-10-29 DIAGNOSIS — E118 Type 2 diabetes mellitus with unspecified complications: Secondary | ICD-10-CM | POA: Diagnosis not present

## 2024-10-29 DIAGNOSIS — J019 Acute sinusitis, unspecified: Secondary | ICD-10-CM

## 2024-10-29 MED ORDER — DOXYCYCLINE HYCLATE 100 MG PO TABS: 100.0000 mg | ORAL_TABLET | Freq: Two times a day (BID) | ORAL | 0 refills | Status: AC

## 2024-10-29 NOTE — Progress Notes (Unsigned)
 Established Patient Office Visit  Subjective    Patient ID: Ashley [name], female    DOB: 1966-08-30  Age: 58 y.o. MRN: [id number]  CC:  Chief Complaint  Patient presents with   Medical Management of Chronic Issues    HPI AKAYA PROFFIT presents for follow up of hypertension and diabetes. She also reports that she has had sinus pain and congestion with purulent rhinorrhea for almost 2 weeks. She denies fever/chills or viral sx.   Outpatient Encounter Medications as of 10/29/2024  Medication Sig   Apremilast  (OTEZLA ) 30 MG TABS Take 1 tablet (30 mg total) by mouth 2 (two) times daily.   atorvastatin  (LIPITOR) 80 MG tablet Take 1 tablet (80 mg total) by mouth daily.   BD PEN NEEDLE NANO 2ND GEN 32G X 4 MM MISC Inject into the skin 4 (four) times daily.   calcipotriene -betamethasone (TACLONEX) external suspension apply to affected areas of the scalp daily twice a day until clear   Continuous Glucose Receiver (FREESTYLE LIBRE 2 READER) DEVI USE TO CHECK BLOOD SUGAR AS DIRECTED   Continuous Glucose Sensor (FREESTYLE LIBRE 2 SENSOR) MISC USE TO CHECK BLOOD SUGAR DAILY   doxycycline  (VIBRA -TABS) 100 MG tablet Take 1 tablet (100 mg total) by mouth 2 (two) times daily.   insulin  glargine-yfgn (SEMGLEE , YFGN,) 100 UNIT/ML Pen Inject 42 Units into the skin daily.   Insulin  Pen Needle 32G X 6 MM MISC Inject 1 Act into the skin 4 (four) times daily. Use daily as directed   irbesartan  (AVAPRO ) 300 MG tablet TAKE 1 TABLET(300 MG) BY MOUTH DAILY   Lancet Devices (ADJUSTABLE LANCING DEVICE) MISC Up to tid testing   mirtazapine  (REMERON ) 15 MG tablet TAKE 1 TABLET(15 MG) BY MOUTH AT BEDTIME   Semaglutide , 2 MG/DOSE, 8 MG/3ML SOPN Inject 2 mg as directed once a week.   traZODone  (DESYREL ) 150 MG tablet TAKE 1 TABLET(150 MG) BY MOUTH AT BEDTIME   benzonatate  (TESSALON ) 100 MG capsule Take 1 capsule (100 mg total) by mouth every 8 (eight) hours. (Patient not taking: Reported on 10/29/2024)   clobetasol   (TEMOVATE ) 0.05 % external solution Apply 1 Application topically 2 (two) times daily.   doxycycline  (VIBRAMYCIN ) 100 MG capsule Take 1 capsule (100 mg total) by mouth 2 (two) times daily. (Patient not taking: Reported on 10/29/2024)   No facility-administered encounter medications on file as of 10/29/2024.    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 Jenetta, 07/2014   GERD (gastroesophageal reflux disease)    H/O exercise stress test 2011   done at [facility]- told that it was WNL, done due to pt. having panic attacks    History of blood transfusion 1966-06-07   at birth in [place], [place], unsure number of units   PONV (postoperative nausea and vomiting)    Poor dentition    very poor oral health    SVD (spontaneous vaginal delivery)    x 3   Tobacco abuse  Wears glasses     Past Surgical History:  Procedure Laterality Date   ABDOMINAL HYSTERECTOMY     APPENDECTOMY  1988   BREAST LUMPECTOMY  2021   BREAST LUMPECTOMY WITH RADIOACTIVE SEED LOCALIZATION Left 03/17/2020   Procedure: LEFT BREAST LUMPECTOMY WITH RADIOACTIVE SEED LOCALIZATION;  Surgeon: Vernetta Berg, MD;  Location: [facility] OR;  Service: General;  Laterality: Left;  LMA   CARDIAC CATHETERIZATION N/A 08/17/2016   Procedure: Left Heart Cath and Coronary Angiography;  Surgeon: Lonni JONETTA Cash, MD;  Location: [facility] INVASIVE CV LAB;  Service: Cardiovascular;  Laterality: N/A;   CHOLECYSTECTOMY N/A 06/07/2014   Procedure: LAPAROSCOPIC CHOLECYSTECTOMY;  Surgeon: Lynda Leos, MD;   Location: [facility] OR;  Service: General;  Laterality: N/A;   CHOLECYSTECTOMY  06/07/2014   COLONOSCOPY  02/16/2014   normal    ESOPHAGOGASTRODUODENOSCOPY  12/28/2013   KNEE ARTHROSCOPY  1995   left   LAPAROSCOPIC ASSISTED VAGINAL HYSTERECTOMY N/A 04/15/2014   Procedure: LAPAROSCOPIC ASSISTED VAGINAL HYSTERECTOMY;  Surgeon: Truman Corona, MD;  Location: [facility] ORS;  Service: Gynecology;  Laterality: N/A;   LAPAROSCOPIC BILATERAL SALPINGO OOPHERECTOMY Bilateral 04/15/2014   Procedure: LAPAROSCOPIC BILATERAL SALPINGO OOPHORECTOMY;  Surgeon: Truman Corona, MD;  Location: [facility] ORS;  Service: Gynecology;  Laterality: Bilateral;   TUBAL LIGATION      Family History  Problem Relation Age of Onset   Diabetes Mother    Hyperlipidemia Mother    Hypertension Mother    Anxiety disorder Mother    Depression Mother    Drug abuse Mother    Stroke Mother        x3   Prostate cancer Father    Alcohol abuse Father    Lung cancer Maternal Grandfather        smoked   Emphysema Maternal Grandfather        smoked   COPD Maternal Grandmother        never smoked    Dementia Maternal Grandmother    Thyroid  cancer Paternal Aunt    Colon cancer Neg Hx    Rectal cancer Neg Hx    Stomach cancer Neg Hx    Migraines Neg Hx     Social History   Socioeconomic History   Marital status: Married    Spouse name: Wadie   Number of children: 6   Years of education: 12   Highest education level: Not on file  Occupational History   Occupation:      Comment: helps run a friends business  Tobacco Use   Smoking status: Every Day    Current packs/day: 2.00    Average packs/day: 2.0 packs/day for 35.0 years (70.0 ttl pk-yrs)    Types: Cigarettes   Smokeless tobacco: Never  Vaping Use   Vaping status: Never Used  Substance and Sexual Activity   Alcohol use: No    Alcohol/week: 0.0 standard drinks of alcohol   Drug use: No   Sexual activity: Yes    Partners: Male    Birth control/protection: Surgical   Other Topics Concern   Not on file  Social History Narrative   5 living children, one child is a crack cocaine addict who often breaks into their house to steal money. He is currently in jail. One child died of cerebral palsy.   Caffeine use: Dr Nunzio (3 per day)   2 cups coffee per day   Online student, currently not working   Social Drivers of [employer] Strain: Low Risk  (09/24/2023)   Overall  Financial Resource Strain (CARDIA)    Difficulty of Paying Living Expenses: Not very hard  Food Insecurity: No Food Insecurity (01/25/2021)   Received from [facility]   Hunger Vital Sign    Within the past 12 months, you worried that your food would run out before you got the money to buy more.: Never true    Within the past 12 months, the food you bought just didn't last and you didn't have money to get more.: Never true  Transportation Needs: No Transportation Needs (09/24/2023)   PRAPARE - Administrator, Civil Service (Medical): No    Lack of Transportation (Non-Medical): No  Physical Activity: Insufficiently Active (09/24/2023)   Exercise Vital Sign    Days of Exercise per Week: 3 days    Minutes of Exercise per Session: 30 min  Stress: No Stress Concern Present (09/24/2023)   [employer] of Occupational Health - Occupational Stress Questionnaire    Feeling of Stress : Not at all  Social Connections: Unknown (09/24/2023)   Social Connection and Isolation Panel    Frequency of Communication with Friends and Family: More than three times a week    Frequency of Social Gatherings with Friends and Family: More than three times a week    Attends Religious Services: More than 4 times per year    Active Member of [employer] or Organizations: Yes    Attends Banker Meetings: 1 to 4 times per year    Marital Status: Not on file  Intimate Partner Violence: Not At Risk (09/24/2023)   Humiliation, Afraid, Rape, and Kick questionnaire    Fear of Current  or Ex-Partner: No    Emotionally Abused: No    Physically Abused: No    Sexually Abused: No    Review of Systems  All other systems reviewed and are negative.       Objective    BP (!) 162/82   Pulse 82   Ht 5' 2 (1.575 m)   Wt 124 lb 9.6 oz (56.5 kg)   LMP 02/12/2014   SpO2 95%   BMI 22.79 kg/m   Physical Exam Vitals and nursing note reviewed.  Constitutional:      General: She is not in acute distress. Cardiovascular:     Rate and Rhythm: Normal rate and regular rhythm.  Pulmonary:     Effort: Pulmonary effort is normal.     Breath sounds: Normal breath sounds.  Abdominal:     Palpations: Abdomen is soft.     Tenderness: There is no abdominal tenderness.  Neurological:     General: No focal deficit present.     Mental Status: She is alert and oriented to person, place, and time.  Psychiatric:        Mood and Affect: Mood normal.        Behavior: Behavior normal.         Assessment & Plan:   Diabetes mellitus with complication (HCC)  Essential hypertension  Hyperlipidemia, unspecified hyperlipidemia type  Acute sinusitis, recurrence not specified, unspecified location  Other orders -     Doxycycline  Hyclate; Take 1 tablet (100 mg total) by mouth 2 (two) times daily.  Dispense: 20 tablet; Refill: 0     Return in about 2 weeks (around 11/12/2024).   Tanda Raguel SQUIBB, MD

## 2024-10-30 ENCOUNTER — Encounter: Payer: Self-pay | Admitting: Family Medicine

## 2024-11-04 ENCOUNTER — Encounter: Payer: Self-pay | Admitting: Dermatology

## 2024-11-04 ENCOUNTER — Ambulatory Visit: Admitting: Dermatology

## 2024-11-04 VITALS — BP 147/84 | HR 88

## 2024-11-04 DIAGNOSIS — Z79899 Other long term (current) drug therapy: Secondary | ICD-10-CM | POA: Diagnosis not present

## 2024-11-04 DIAGNOSIS — L409 Psoriasis, unspecified: Secondary | ICD-10-CM

## 2024-11-04 DIAGNOSIS — L400 Psoriasis vulgaris: Secondary | ICD-10-CM

## 2024-11-04 MED ORDER — FLUOCINOLONE ACETONIDE BODY 0.01 % EX OIL: 1.0000 | TOPICAL_OIL | Freq: Two times a day (BID) | CUTANEOUS | 5 refills | Status: AC

## 2024-11-04 MED ORDER — BETAMETHASONE DIPROPIONATE 0.05 % EX LOTN: TOPICAL_LOTION | Freq: Every day | CUTANEOUS | 5 refills | Status: AC

## 2024-11-04 MED ORDER — BETAMETHASONE VALERATE 0.12 % EX FOAM: 1.0000 | Freq: Every day | CUTANEOUS | 5 refills | Status: DC

## 2024-11-04 NOTE — Progress Notes (Signed)
   Follow-Up Visit  Patient (and/or pt guardian) consented to the use of AI-assisted tools for note generation.    Subjective  Ashley [name] is a 58 y.o. female who presents for the following: Psoriasis Patient was last evaluated on 06/29/24.  At this visit patient was prescribed Otezla .  Patient was recommended to use pepto-bismol to help with initial GI distress Patient reports sxs are unchanged.  Patient reports she has not noticed improvement since starting medication. Patient rates itch 9/10 Patient reports she has been using the [employer] Zinc shampoo three times a week and also uses Clobetasol  solution 0.05% every night.  Patient denies medication changes.  The following portions of the chart were reviewed this encounter and updated as appropriate: medications, allergies, medical history  Review of Systems:  No other skin or systemic complaints except as noted in HPI or Assessment and Plan.  Objective  Well appearing patient in no apparent distress; mood and affect are within normal limits.  A focused examination was performed of the following areas: scalp  Relevant exam findings are noted in the Assessment and Plan.         Assessment & Plan   Psoriasis vulgaris Flare-up over the past 2-3 weeks, likely triggered by recent prednisone  use for a sinus infection. Initial improvement with Otezla  and clobetasol , but symptoms have worsened. Clobetasol  causes greasy hair, limiting its use to nighttime. Otezla  side effects have stabilized. Considering switch to betamethasone lotion for better efficacy and less greasiness. If no improvement in 3 months, consider injectable biologics after screening for tuberculosis and hepatitis.  - Switched from clobetasol  to betamethasone lotion for topical treatment. - Continue Otezla  30mg  BID. - Prescribed Dermasmooth scalp oil for nighttime use. - Instructed to apply betamethasone drops after washing hair and blow dry. - Ordered labs to screen for  tuberculosis and hepatitis pending injectable biologic initiation . - Scheduled follow-up in 3 months.   Long term medication management.  Patient is using long term (months to years) prescription medication  to control their dermatologic condition.  These medications require periodic monitoring to evaluate for efficacy and side effects and may require periodic laboratory monitoring.      PSORIASIS   Related Medications calcipotriene -betamethasone (TACLONEX) external suspension apply to affected areas of the scalp daily twice a day until clear Fluocinolone Acetonide Body 0.01 % OIL Apply 1 Application topically 2 (two) times daily. Betamethasone Valerate 0.12 % foam Apply 1 Application topically daily.  Return in about 3 months (around 02/04/2025) for Psoriasis F/U.  I, Lyle Cords, as acting as a neurosurgeon for [employer], DO .   Documentation: I have reviewed the above documentation for accuracy and completeness, and I agree with the above.  Delon Lenis, DO

## 2024-11-04 NOTE — Patient Instructions (Addendum)
 VISIT SUMMARY:  During your visit, we discussed the recent worsening of your psoriasis symptoms despite ongoing treatment. We reviewed your current medications and the impact of your recent sinus infection and prednisone  use on your condition.  YOUR PLAN:  -PSORIASIS VULGARIS:  Psoriasis vulgaris is a chronic skin condition that causes red, scaly patches on the skin. Your recent flare-up may have been triggered by the prednisone  you took for your sinus infection. We are switching your topical treatment from clobetasol  to betamethasone lotion, which should be less greasy and more effective.   Continue taking Otezla  as prescribed. Additionally, you will use Dermasmooth scalp oil at night and apply betamethasone drops after washing your hair and then blow dry. We have also ordered labs to screen for tuberculosis and hepatitis, which are necessary before considering injectable biologics if there is no improvement in three months.  INSTRUCTIONS:  Please continue with the new treatment plan and follow the instructions for applying the medications. We have scheduled a follow-up appointment in three months to assess your progress. Make sure to complete the lab tests for tuberculosis and hepatitis as soon as possible.   Important Information  Due to recent changes in healthcare laws, you may see results of your pathology and/or laboratory studies on MyChart before the doctors have had a chance to review them. We understand that in some cases there may be results that are confusing or concerning to you. Please understand that not all results are received at the same time and often the doctors may need to interpret multiple results in order to provide you with the best plan of care or course of treatment. Therefore, we ask that you please give us  2 business days to thoroughly review all your results before contacting the office for clarification. Should we see a critical lab result, you will be contacted  sooner.   If You Need Anything After Your Visit  If you have any questions or concerns for your doctor, please call our main line at [phone] If no one answers, please leave a voicemail as directed and we will return your call as soon as possible. Messages left after 4 pm will be answered the following business day.   You may also send us  a message via MyChart. We typically respond to MyChart messages within 1-2 business days.  For prescription refills, please ask your pharmacy to contact our office. Our fax number is [phone].  If you have an urgent issue when the clinic is closed that cannot wait until the next business day, you can page your doctor at the number below.    Please note that while we do our best to be available for urgent issues outside of office hours, we are not available 24/7.   If you have an urgent issue and are unable to reach us , you may choose to seek medical care at your doctor's office, retail clinic, urgent care center, or emergency room.  If you have a medical emergency, please immediately call 911 or go to the emergency department. In the event of inclement weather, please call our main line at [phone] for an update on the status of any delays or closures.  Dermatology Medication Tips: Please keep the boxes that topical medications come in in order to help keep track of the instructions about where and how to use these. Pharmacies typically print the medication instructions only on the boxes and not directly on the medication tubes.   If your medication is too expensive, please contact  our office at [phone] or send us  a message through MyChart.   We are unable to tell what your co-pay for medications will be in advance as this is different depending on your insurance coverage. However, we may be able to find a substitute medication at lower cost or fill out paperwork to get insurance to cover a needed medication.   If a prior authorization is  required to get your medication covered by your insurance company, please allow us  1-2 business days to complete this process.  Drug prices often vary depending on where the prescription is filled and some pharmacies may offer cheaper prices.  The website www.goodrx.com contains coupons for medications through different pharmacies. The prices here do not account for what the cost may be with help from insurance (it may be cheaper with your insurance), but the website can give you the price if you did not use any insurance.  - You can print the associated coupon and take it with your prescription to the pharmacy.  - You may also stop by our office during regular business hours and pick up a GoodRx coupon card.  - If you need your prescription sent electronically to a different pharmacy, notify our office through [facility] or by phone at [phone]

## 2024-11-12 ENCOUNTER — Ambulatory Visit: Admitting: Family Medicine

## 2024-11-12 ENCOUNTER — Encounter: Payer: Self-pay | Admitting: Family Medicine

## 2024-11-12 VITALS — BP 182/84 | HR 75 | Ht 62.0 in | Wt 127.0 lb

## 2024-11-12 DIAGNOSIS — E785 Hyperlipidemia, unspecified: Secondary | ICD-10-CM | POA: Diagnosis not present

## 2024-11-12 DIAGNOSIS — E118 Type 2 diabetes mellitus with unspecified complications: Secondary | ICD-10-CM

## 2024-11-12 DIAGNOSIS — I10 Essential (primary) hypertension: Secondary | ICD-10-CM | POA: Diagnosis not present

## 2024-11-12 LAB — POCT GLYCOSYLATED HEMOGLOBIN (HGB A1C): HbA1c, POC (controlled diabetic range): 8.1 % — AB (ref 0.0–7.0)

## 2024-11-12 MED ORDER — HYDROCHLOROTHIAZIDE 12.5 MG PO CAPS: 12.5000 mg | ORAL_CAPSULE | Freq: Every day | ORAL | 1 refills | Status: AC

## 2024-11-12 NOTE — Progress Notes (Signed)
 sh

## 2024-11-12 NOTE — Progress Notes (Signed)
 Established Patient Office Visit  Subjective    Patient ID: Ashley [name], female    DOB: [birth year]  Age: 58 y.o. MRN: [id number]  CC:  Chief Complaint  Patient presents with   Medical Management of Chronic Issues    HPI BRENDAN GRUWELL presents for routine follow up of chronic med issues including diabetes and hypertension. Patient reports med compliance and denies acute complaints.   Outpatient Encounter Medications as of 11/12/2024  Medication Sig   Apremilast  (OTEZLA ) 30 MG TABS Take 1 tablet (30 mg total) by mouth 2 (two) times daily.   atorvastatin  (LIPITOR) 80 MG tablet Take 1 tablet (80 mg total) by mouth daily.   BD PEN NEEDLE NANO 2ND GEN 32G X 4 MM MISC Inject into the skin 4 (four) times daily.   betamethasone  dipropionate 0.05 % lotion Apply topically daily.   calcipotriene -betamethasone  (TACLONEX) external suspension apply to affected areas of the scalp daily twice a day until clear   Continuous Glucose Receiver (FREESTYLE LIBRE 2 READER) DEVI USE TO CHECK BLOOD SUGAR AS DIRECTED   Continuous Glucose Sensor (FREESTYLE LIBRE 2 SENSOR) MISC USE TO CHECK BLOOD SUGAR DAILY   Fluocinolone  Acetonide Body 0.01 % OIL Apply 1 Application topically 2 (two) times daily.   hydrochlorothiazide  (MICROZIDE ) 12.5 MG capsule Take 1 capsule (12.5 mg total) by mouth daily.   insulin  glargine-yfgn (SEMGLEE , YFGN,) 100 UNIT/ML Pen Inject 42 Units into the skin daily.   Insulin  Pen Needle 32G X 6 MM MISC Inject 1 Act into the skin 4 (four) times daily. Use daily as directed   irbesartan  (AVAPRO ) 300 MG tablet TAKE 1 TABLET(300 MG) BY MOUTH DAILY   Lancet Devices (ADJUSTABLE LANCING DEVICE) MISC Up to tid testing   mirtazapine  (REMERON ) 15 MG tablet TAKE 1 TABLET(15 MG) BY MOUTH AT BEDTIME   Semaglutide , 2 MG/DOSE, 8 MG/3ML SOPN Inject 2 mg as directed once a week.   traZODone  (DESYREL ) 150 MG tablet TAKE 1 TABLET(150 MG) BY MOUTH AT BEDTIME   benzonatate  (TESSALON ) 100 MG capsule Take 1 capsule  (100 mg total) by mouth every 8 (eight) hours. (Patient not taking: Reported on 11/12/2024)   doxycycline  (VIBRA -TABS) 100 MG tablet Take 1 tablet (100 mg total) by mouth 2 (two) times daily. (Patient not taking: Reported on 11/12/2024)   doxycycline  (VIBRAMYCIN ) 100 MG capsule Take 1 capsule (100 mg total) by mouth 2 (two) times daily. (Patient not taking: Reported on 11/12/2024)   No facility-administered encounter medications on file as of 11/12/2024.    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 Jenetta, 07/2014   GERD (gastroesophageal reflux disease)    H/O exercise stress test 2011   done at [facility]- told that it was WNL, done due to pt. having panic attacks    History of blood transfusion December 15, 1966   at birth in [place], [place], unsure number of units   PONV (postoperative nausea and vomiting)    Poor dentition  very poor oral health    SVD (spontaneous vaginal delivery)    x 3   Tobacco abuse    Wears glasses     Past Surgical History:  Procedure Laterality Date   ABDOMINAL HYSTERECTOMY     APPENDECTOMY  1988   BREAST LUMPECTOMY  2021   BREAST LUMPECTOMY WITH RADIOACTIVE SEED LOCALIZATION Left 03/17/2020   Procedure: LEFT BREAST LUMPECTOMY WITH RADIOACTIVE SEED LOCALIZATION;  Surgeon: Vernetta Berg, MD;  Location: [facility] OR;  Service: General;  Laterality: Left;  LMA   CARDIAC CATHETERIZATION N/A 08/17/2016   Procedure: Left Heart Cath and Coronary Angiography;  Surgeon: Lonni JONETTA Cash, MD;  Location: [facility] INVASIVE CV LAB;   Service: Cardiovascular;  Laterality: N/A;   CHOLECYSTECTOMY N/A 06/07/2014   Procedure: LAPAROSCOPIC CHOLECYSTECTOMY;  Surgeon: Lynda Leos, MD;  Location: [facility] OR;  Service: General;  Laterality: N/A;   CHOLECYSTECTOMY  06/07/2014   COLONOSCOPY  02/16/2014   normal    ESOPHAGOGASTRODUODENOSCOPY  12/28/2013   KNEE ARTHROSCOPY  1995   left   LAPAROSCOPIC ASSISTED VAGINAL HYSTERECTOMY N/A 04/15/2014   Procedure: LAPAROSCOPIC ASSISTED VAGINAL HYSTERECTOMY;  Surgeon: Truman Corona, MD;  Location: [facility] ORS;  Service: Gynecology;  Laterality: N/A;   LAPAROSCOPIC BILATERAL SALPINGO OOPHERECTOMY Bilateral 04/15/2014   Procedure: LAPAROSCOPIC BILATERAL SALPINGO OOPHORECTOMY;  Surgeon: Truman Corona, MD;  Location: [facility] ORS;  Service: Gynecology;  Laterality: Bilateral;   TUBAL LIGATION      Family History  Problem Relation Age of Onset   Diabetes Mother    Hyperlipidemia Mother    Hypertension Mother    Anxiety disorder Mother    Depression Mother    Drug abuse Mother    Stroke Mother        x3   Prostate cancer Father    Alcohol abuse Father    Lung cancer Maternal Grandfather        smoked   Emphysema Maternal Grandfather        smoked   COPD Maternal Grandmother        never smoked    Dementia Maternal Grandmother    Thyroid  cancer Paternal Aunt    Colon cancer Neg Hx    Rectal cancer Neg Hx    Stomach cancer Neg Hx    Migraines Neg Hx     Social History   Socioeconomic History   Marital status: Married    Spouse name: Wadie   Number of children: 6   Years of education: 12   Highest education level: Not on file  Occupational History   Occupation:      Comment: helps run a friends business  Tobacco Use   Smoking status: Every Day    Current packs/day: 2.00    Average packs/day: 2.0 packs/day for 35.0 years (70.0 ttl pk-yrs)    Types: Cigarettes   Smokeless tobacco: Never  Vaping Use   Vaping status: Never Used  Substance and Sexual Activity   Alcohol use: No     Alcohol/week: 0.0 standard drinks of alcohol   Drug use: No   Sexual activity: Yes    Partners: Male    Birth control/protection: Surgical  Other Topics Concern   Not on file  Social History Narrative   5 living children, one child is a crack cocaine addict who often breaks into their house to steal money. He is currently in jail. One child died of cerebral palsy.   Caffeine use: Dr Nunzio (3 per day)   2 cups coffee per day  Designer, jewellery, currently not working   Social Drivers of Corporate Investment Banker Strain: Low Risk  (11/12/2024)   Overall Financial Resource Strain (CARDIA)    Difficulty of Paying Living Expenses: Not hard at all  Food Insecurity: No Food Insecurity (11/12/2024)   Hunger Vital Sign    Worried About Running Out of Food in the Last Year: Never true    Ran Out of Food in the Last Year: Never true  Transportation Needs: No Transportation Needs (11/12/2024)   PRAPARE - Administrator, Civil Service (Medical): No    Lack of Transportation (Non-Medical): No  Physical Activity: Inactive (11/12/2024)   Exercise Vital Sign    Days of Exercise per Week: 0 days    Minutes of Exercise per Session: 0 min  Stress: No Stress Concern Present (11/12/2024)   [employer] of Occupational Health - Occupational Stress Questionnaire    Feeling of Stress: Not at all  Social Connections: Moderately Isolated (11/12/2024)   Social Connection and Isolation Panel    Frequency of Communication with Friends and Family: More than three times a week    Frequency of Social Gatherings with Friends and Family: More than three times a week    Attends Religious Services: Never    Database Administrator or Organizations: No    Attends Banker Meetings: Never    Marital Status: Married  Catering Manager Violence: Not At Risk (11/12/2024)   Humiliation, Afraid, Rape, and Kick questionnaire    Fear of Current or Ex-Partner: No    Emotionally Abused: No     Physically Abused: No    Sexually Abused: No    Review of Systems  All other systems reviewed and are negative.       Objective    BP (!) 182/84   Pulse 75   Ht 5' 2 (1.575 m)   Wt 127 lb (57.6 kg)   LMP 02/12/2014   SpO2 97%   BMI 23.23 kg/m   Physical Exam Vitals and nursing note reviewed.  Constitutional:      General: She is not in acute distress. Cardiovascular:     Rate and Rhythm: Normal rate and regular rhythm.  Pulmonary:     Effort: Pulmonary effort is normal.     Breath sounds: Normal breath sounds.  Abdominal:     Palpations: Abdomen is soft.     Tenderness: There is no abdominal tenderness.  Neurological:     General: No focal deficit present.     Mental Status: She is alert and oriented to person, place, and time.  Psychiatric:        Mood and Affect: Mood normal.        Behavior: Behavior normal.         Assessment & Plan:  1. Diabetes mellitus with complication (HCC) (Primary) Slightly increased A1c and above goal. Continue. Discussed compliance.  - POCT glycosylated hemoglobin (Hb A1C)  2. Essential hypertension Elevated reading. Will add hydrochlorothiazide  to regimen  3. Hyperlipidemia, unspecified hyperlipidemia type Continue      Return in about 3 months (around 02/12/2025) for follow up.   Tanda Raguel SQUIBB, MD

## 2024-11-16 ENCOUNTER — Encounter: Payer: Self-pay | Admitting: Family Medicine

## 2024-11-21 ENCOUNTER — Encounter: Payer: Self-pay | Admitting: Internal Medicine

## 2024-12-01 ENCOUNTER — Ambulatory Visit: Admitting: Physician Assistant

## 2024-12-14 ENCOUNTER — Other Ambulatory Visit: Payer: Self-pay | Admitting: Family Medicine

## 2024-12-30 ENCOUNTER — Encounter: Payer: Self-pay | Admitting: Family Medicine

## 2024-12-31 ENCOUNTER — Other Ambulatory Visit: Payer: Self-pay | Admitting: Family Medicine

## 2024-12-31 DIAGNOSIS — I10 Essential (primary) hypertension: Secondary | ICD-10-CM

## 2025-02-11 ENCOUNTER — Ambulatory Visit: Admitting: Dermatology

## 2025-02-12 ENCOUNTER — Ambulatory Visit: Admitting: Family Medicine

## 2025-02-15 ENCOUNTER — Ambulatory Visit: Admitting: Family Medicine
